# Patient Record
Sex: Female | Born: 1935
Health system: Southern US, Community
[De-identification: ages and names within clinical notes are randomized; demographics above are authoritative.]

## PROBLEM LIST (undated history)

## (undated) DIAGNOSIS — M5416 Radiculopathy, lumbar region: Secondary | ICD-10-CM

## (undated) DIAGNOSIS — M8430XA Stress fracture, unspecified site, initial encounter for fracture: Secondary | ICD-10-CM

## (undated) DIAGNOSIS — D329 Benign neoplasm of meninges, unspecified: Secondary | ICD-10-CM

## (undated) DIAGNOSIS — R55 Syncope and collapse: Secondary | ICD-10-CM

## (undated) DIAGNOSIS — I4891 Unspecified atrial fibrillation: Secondary | ICD-10-CM

## (undated) DIAGNOSIS — M7551 Bursitis of right shoulder: Secondary | ICD-10-CM

## (undated) DIAGNOSIS — Z809 Family history of malignant neoplasm, unspecified: Secondary | ICD-10-CM

## (undated) DIAGNOSIS — I10 Essential (primary) hypertension: Secondary | ICD-10-CM

## (undated) DIAGNOSIS — K219 Gastro-esophageal reflux disease without esophagitis: Secondary | ICD-10-CM

## (undated) DIAGNOSIS — C4490 Unspecified malignant neoplasm of skin, unspecified: Secondary | ICD-10-CM

## (undated) DIAGNOSIS — T7840XA Allergy, unspecified, initial encounter: Secondary | ICD-10-CM

## (undated) DIAGNOSIS — S82892A Other fracture of left lower leg, initial encounter for closed fracture: Secondary | ICD-10-CM

## (undated) DIAGNOSIS — E785 Hyperlipidemia, unspecified: Secondary | ICD-10-CM

## (undated) DIAGNOSIS — C50919 Malignant neoplasm of unspecified site of unspecified female breast: Secondary | ICD-10-CM

## (undated) DIAGNOSIS — H269 Unspecified cataract: Secondary | ICD-10-CM

## (undated) DIAGNOSIS — S62101A Fracture of unspecified carpal bone, right wrist, initial encounter for closed fracture: Secondary | ICD-10-CM

## (undated) HISTORY — DX: Hyperlipidemia, unspecified: E78.5

## (undated) HISTORY — PX: EYE SURGERY: SHX253

## (undated) HISTORY — DX: Essential (primary) hypertension: I10

## (undated) HISTORY — DX: Unspecified malignant neoplasm of skin, unspecified: C44.90

## (undated) HISTORY — DX: Stress fracture, unspecified site, initial encounter for fracture: M84.30XA

## (undated) HISTORY — DX: Radiculopathy, lumbar region: M54.16

## (undated) HISTORY — PX: APPENDECTOMY: SHX54

## (undated) HISTORY — DX: Bursitis of right shoulder: M75.51

## (undated) HISTORY — DX: Benign neoplasm of meninges, unspecified: D32.9

## (undated) HISTORY — DX: Unspecified cataract: H26.9

## (undated) HISTORY — PX: OTHER SURGICAL HISTORY: SHX169

## (undated) HISTORY — DX: Family history of malignant neoplasm, unspecified: Z80.9

## (undated) HISTORY — DX: Unspecified atrial fibrillation: I48.91

## (undated) HISTORY — PX: DG  BONE DENSITY (ARMC HX): HXRAD1102

## (undated) HISTORY — PX: BREAST LUMPECTOMY: SHX2

## (undated) HISTORY — PX: GANGLION CYST EXCISION: SHX1691

---

## 1986-06-27 HISTORY — PX: ABDOMINAL HYSTERECTOMY: SHX81

## 1998-06-27 DIAGNOSIS — C4490 Unspecified malignant neoplasm of skin, unspecified: Secondary | ICD-10-CM

## 1998-06-27 HISTORY — DX: Unspecified malignant neoplasm of skin, unspecified: C44.90

## 2004-07-19 ENCOUNTER — Emergency Department (HOSPITAL_COMMUNITY): Admission: EM | Admit: 2004-07-19 | Discharge: 2004-07-19 | Payer: Self-pay | Admitting: *Deleted

## 2004-07-23 ENCOUNTER — Ambulatory Visit: Payer: Self-pay | Admitting: Family Medicine

## 2004-10-21 ENCOUNTER — Ambulatory Visit: Payer: Self-pay | Admitting: Family Medicine

## 2004-12-14 ENCOUNTER — Ambulatory Visit: Payer: Self-pay | Admitting: Internal Medicine

## 2005-01-07 ENCOUNTER — Ambulatory Visit: Payer: Self-pay | Admitting: Family Medicine

## 2005-02-10 ENCOUNTER — Ambulatory Visit: Payer: Self-pay | Admitting: Family Medicine

## 2005-02-22 ENCOUNTER — Ambulatory Visit: Payer: Self-pay | Admitting: Family Medicine

## 2005-04-25 ENCOUNTER — Ambulatory Visit: Payer: Self-pay | Admitting: Family Medicine

## 2005-08-04 ENCOUNTER — Ambulatory Visit: Payer: Self-pay | Admitting: Family Medicine

## 2005-11-18 ENCOUNTER — Ambulatory Visit: Payer: Self-pay | Admitting: Internal Medicine

## 2006-01-05 ENCOUNTER — Ambulatory Visit: Payer: Self-pay | Admitting: Family Medicine

## 2006-02-24 ENCOUNTER — Encounter: Admission: RE | Admit: 2006-02-24 | Discharge: 2006-02-24 | Payer: Self-pay | Admitting: Family Medicine

## 2006-03-15 ENCOUNTER — Ambulatory Visit: Payer: Self-pay | Admitting: Family Medicine

## 2006-05-24 ENCOUNTER — Ambulatory Visit: Payer: Self-pay | Admitting: Family Medicine

## 2006-09-19 ENCOUNTER — Ambulatory Visit: Payer: Self-pay | Admitting: Family Medicine

## 2006-10-17 DIAGNOSIS — N6009 Solitary cyst of unspecified breast: Secondary | ICD-10-CM | POA: Insufficient documentation

## 2006-10-17 DIAGNOSIS — Z8719 Personal history of other diseases of the digestive system: Secondary | ICD-10-CM | POA: Insufficient documentation

## 2006-10-17 DIAGNOSIS — C439 Malignant melanoma of skin, unspecified: Secondary | ICD-10-CM | POA: Insufficient documentation

## 2007-04-10 ENCOUNTER — Ambulatory Visit: Payer: Self-pay | Admitting: Family Medicine

## 2007-04-11 ENCOUNTER — Encounter: Admission: RE | Admit: 2007-04-11 | Discharge: 2007-04-11 | Payer: Self-pay | Admitting: Family Medicine

## 2007-04-16 ENCOUNTER — Encounter (INDEPENDENT_AMBULATORY_CARE_PROVIDER_SITE_OTHER): Payer: Self-pay | Admitting: *Deleted

## 2007-07-12 DIAGNOSIS — M949 Disorder of cartilage, unspecified: Secondary | ICD-10-CM

## 2007-07-12 DIAGNOSIS — M899 Disorder of bone, unspecified: Secondary | ICD-10-CM | POA: Insufficient documentation

## 2007-07-27 ENCOUNTER — Encounter: Payer: Self-pay | Admitting: Family Medicine

## 2007-07-27 ENCOUNTER — Ambulatory Visit: Payer: Self-pay | Admitting: Internal Medicine

## 2007-08-03 ENCOUNTER — Encounter (INDEPENDENT_AMBULATORY_CARE_PROVIDER_SITE_OTHER): Payer: Self-pay | Admitting: *Deleted

## 2007-09-24 ENCOUNTER — Telehealth (INDEPENDENT_AMBULATORY_CARE_PROVIDER_SITE_OTHER): Payer: Self-pay | Admitting: *Deleted

## 2007-10-18 ENCOUNTER — Encounter (INDEPENDENT_AMBULATORY_CARE_PROVIDER_SITE_OTHER): Payer: Self-pay | Admitting: *Deleted

## 2007-10-18 ENCOUNTER — Ambulatory Visit: Payer: Self-pay | Admitting: Family Medicine

## 2007-10-18 DIAGNOSIS — I10 Essential (primary) hypertension: Secondary | ICD-10-CM | POA: Insufficient documentation

## 2007-10-18 LAB — CONVERTED CEMR LAB
BUN: 12 mg/dL (ref 6–23)
CO2: 34 meq/L — ABNORMAL HIGH (ref 19–32)
Calcium: 10.2 mg/dL (ref 8.4–10.5)
Chloride: 100 meq/L (ref 96–112)
Creatinine, Ser: 0.7 mg/dL (ref 0.4–1.2)
GFR calc Af Amer: 105 mL/min
GFR calc non Af Amer: 87 mL/min
Glucose, Bld: 67 mg/dL — ABNORMAL LOW (ref 70–99)
Potassium: 4.6 meq/L (ref 3.5–5.1)
Sodium: 141 meq/L (ref 135–145)

## 2008-01-29 ENCOUNTER — Ambulatory Visit: Payer: Self-pay | Admitting: Family Medicine

## 2008-01-29 DIAGNOSIS — L723 Sebaceous cyst: Secondary | ICD-10-CM | POA: Insufficient documentation

## 2008-01-30 ENCOUNTER — Ambulatory Visit: Payer: Self-pay | Admitting: Family Medicine

## 2008-02-07 ENCOUNTER — Encounter: Payer: Self-pay | Admitting: Family Medicine

## 2008-02-07 ENCOUNTER — Ambulatory Visit: Payer: Self-pay | Admitting: Family Medicine

## 2008-02-09 LAB — CONVERTED CEMR LAB
ALT: 18 units/L (ref 0–35)
AST: 16 units/L (ref 0–37)
Albumin: 3.8 g/dL (ref 3.5–5.2)
Alkaline Phosphatase: 56 units/L (ref 39–117)
BUN: 14 mg/dL (ref 6–23)
Bilirubin, Direct: 0.1 mg/dL (ref 0.0–0.3)
CO2: 31 meq/L (ref 19–32)
Calcium: 9.5 mg/dL (ref 8.4–10.5)
Chloride: 103 meq/L (ref 96–112)
Cholesterol: 205 mg/dL (ref 0–200)
Creatinine, Ser: 1 mg/dL (ref 0.4–1.2)
Direct LDL: 131.1 mg/dL
GFR calc Af Amer: 70 mL/min
GFR calc non Af Amer: 57 mL/min
Glucose, Bld: 98 mg/dL (ref 70–99)
HDL: 45.7 mg/dL (ref >39.0)
Potassium: 4.2 meq/L (ref 3.5–5.1)
Sodium: 142 meq/L (ref 135–145)
Total Bilirubin: 0.8 mg/dL (ref 0.3–1.2)
Total CHOL/HDL Ratio: 4.5
Total Protein: 6.9 g/dL (ref 6.0–8.3)
Triglycerides: 106 mg/dL (ref 0–149)
VLDL: 21 mg/dL (ref 0–40)

## 2008-02-11 ENCOUNTER — Encounter (INDEPENDENT_AMBULATORY_CARE_PROVIDER_SITE_OTHER): Payer: Self-pay | Admitting: *Deleted

## 2008-02-12 ENCOUNTER — Ambulatory Visit: Payer: Self-pay | Admitting: Family Medicine

## 2008-02-15 ENCOUNTER — Ambulatory Visit: Payer: Self-pay | Admitting: Family Medicine

## 2008-04-30 ENCOUNTER — Encounter: Payer: Self-pay | Admitting: Family Medicine

## 2008-05-02 ENCOUNTER — Ambulatory Visit: Payer: Self-pay | Admitting: Family Medicine

## 2008-05-06 ENCOUNTER — Encounter: Admission: RE | Admit: 2008-05-06 | Discharge: 2008-05-06 | Payer: Self-pay | Admitting: Family Medicine

## 2008-05-08 ENCOUNTER — Encounter (INDEPENDENT_AMBULATORY_CARE_PROVIDER_SITE_OTHER): Payer: Self-pay | Admitting: *Deleted

## 2008-05-10 ENCOUNTER — Encounter: Payer: Self-pay | Admitting: Family Medicine

## 2008-05-10 ENCOUNTER — Telehealth: Payer: Self-pay | Admitting: Family Medicine

## 2008-05-10 ENCOUNTER — Ambulatory Visit: Payer: Self-pay | Admitting: Family Medicine

## 2008-05-10 DIAGNOSIS — N390 Urinary tract infection, site not specified: Secondary | ICD-10-CM | POA: Insufficient documentation

## 2008-05-10 LAB — CONVERTED CEMR LAB
Bilirubin Urine: NEGATIVE
Glucose, Urine, Semiquant: NEGATIVE
Ketones, urine, test strip: NEGATIVE
Nitrite: NEGATIVE
Protein, U semiquant: NEGATIVE
Specific Gravity, Urine: <1.005
Urobilinogen, UA: 0.2
pH: 6

## 2008-05-13 ENCOUNTER — Telehealth (INDEPENDENT_AMBULATORY_CARE_PROVIDER_SITE_OTHER): Payer: Self-pay | Admitting: *Deleted

## 2008-05-14 ENCOUNTER — Encounter: Payer: Self-pay | Admitting: Family Medicine

## 2008-05-19 ENCOUNTER — Ambulatory Visit: Payer: Self-pay | Admitting: Family Medicine

## 2008-06-27 HISTORY — PX: COLONOSCOPY: SHX174

## 2008-07-01 ENCOUNTER — Ambulatory Visit: Payer: Self-pay | Admitting: Family Medicine

## 2008-08-18 ENCOUNTER — Ambulatory Visit: Payer: Self-pay | Admitting: Family Medicine

## 2008-09-09 ENCOUNTER — Ambulatory Visit: Payer: Self-pay | Admitting: Family Medicine

## 2008-09-11 LAB — CONVERTED CEMR LAB: Varicella IgG: 4.25 — ABNORMAL HIGH

## 2008-09-12 ENCOUNTER — Encounter (INDEPENDENT_AMBULATORY_CARE_PROVIDER_SITE_OTHER): Payer: Self-pay | Admitting: *Deleted

## 2008-09-28 LAB — CONVERTED CEMR LAB
ALT: 30 units/L (ref 0–35)
AST: 26 units/L (ref 0–37)
Albumin: 4 g/dL (ref 3.5–5.2)
Alkaline Phosphatase: 56 units/L (ref 39–117)
BUN: 19 mg/dL (ref 6–23)
Bilirubin, Direct: 0 mg/dL (ref 0.0–0.3)
CO2: 33 meq/L — ABNORMAL HIGH (ref 19–32)
Calcium: 9.9 mg/dL (ref 8.4–10.5)
Chloride: 105 meq/L (ref 96–112)
Cholesterol: 227 mg/dL — ABNORMAL HIGH (ref 0–200)
Creatinine, Ser: 0.7 mg/dL (ref 0.4–1.2)
Direct LDL: 139.4 mg/dL
GFR calc non Af Amer: 86.53 mL/min (ref >60)
Glucose, Bld: 93 mg/dL (ref 70–99)
HDL: 62.2 mg/dL (ref >39.00)
Potassium: 5.1 meq/L (ref 3.5–5.1)
Sodium: 144 meq/L (ref 135–145)
Total Bilirubin: 0.9 mg/dL (ref 0.3–1.2)
Total CHOL/HDL Ratio: 4
Total Protein: 6.9 g/dL (ref 6.0–8.3)
Triglycerides: 93 mg/dL (ref 0.0–149.0)
VLDL: 18.6 mg/dL (ref 0.0–40.0)

## 2008-09-29 ENCOUNTER — Encounter (INDEPENDENT_AMBULATORY_CARE_PROVIDER_SITE_OTHER): Payer: Self-pay | Admitting: *Deleted

## 2009-01-26 ENCOUNTER — Ambulatory Visit: Payer: Self-pay | Admitting: Family Medicine

## 2009-02-09 ENCOUNTER — Telehealth (INDEPENDENT_AMBULATORY_CARE_PROVIDER_SITE_OTHER): Payer: Self-pay | Admitting: *Deleted

## 2009-02-18 ENCOUNTER — Ambulatory Visit: Payer: Self-pay | Admitting: Family Medicine

## 2009-02-18 DIAGNOSIS — S93409A Sprain of unspecified ligament of unspecified ankle, initial encounter: Secondary | ICD-10-CM | POA: Insufficient documentation

## 2009-04-06 ENCOUNTER — Ambulatory Visit: Payer: Self-pay | Admitting: Family Medicine

## 2009-04-06 DIAGNOSIS — M25559 Pain in unspecified hip: Secondary | ICD-10-CM | POA: Insufficient documentation

## 2009-04-15 ENCOUNTER — Telehealth (INDEPENDENT_AMBULATORY_CARE_PROVIDER_SITE_OTHER): Payer: Self-pay | Admitting: *Deleted

## 2009-04-20 ENCOUNTER — Encounter: Payer: Self-pay | Admitting: Family Medicine

## 2009-04-27 ENCOUNTER — Ambulatory Visit: Payer: Self-pay | Admitting: Family Medicine

## 2009-05-15 ENCOUNTER — Encounter: Admission: RE | Admit: 2009-05-15 | Discharge: 2009-05-15 | Payer: Self-pay | Admitting: Family Medicine

## 2009-05-15 LAB — HM MAMMOGRAPHY: HM Mammogram: NEGATIVE

## 2009-06-05 ENCOUNTER — Telehealth (INDEPENDENT_AMBULATORY_CARE_PROVIDER_SITE_OTHER): Payer: Self-pay | Admitting: *Deleted

## 2009-08-14 ENCOUNTER — Telehealth: Payer: Self-pay | Admitting: Family Medicine

## 2009-08-24 ENCOUNTER — Ambulatory Visit: Payer: Self-pay | Admitting: Family Medicine

## 2009-08-24 DIAGNOSIS — L821 Other seborrheic keratosis: Secondary | ICD-10-CM | POA: Insufficient documentation

## 2009-08-26 ENCOUNTER — Telehealth (INDEPENDENT_AMBULATORY_CARE_PROVIDER_SITE_OTHER): Payer: Self-pay | Admitting: *Deleted

## 2009-09-01 ENCOUNTER — Ambulatory Visit: Payer: Self-pay | Admitting: Family Medicine

## 2009-09-09 LAB — CONVERTED CEMR LAB
ALT: 21 units/L (ref 0–35)
AST: 19 units/L (ref 0–37)
Albumin: 3.8 g/dL (ref 3.5–5.2)
Alkaline Phosphatase: 62 units/L (ref 39–117)
BUN: 14 mg/dL (ref 6–23)
Bilirubin, Direct: 0.1 mg/dL (ref 0.0–0.3)
CO2: 33 meq/L — ABNORMAL HIGH (ref 19–32)
Calcium: 9.7 mg/dL (ref 8.4–10.5)
Chloride: 111 meq/L (ref 96–112)
Cholesterol: 204 mg/dL — ABNORMAL HIGH (ref 0–200)
Creatinine, Ser: 0.8 mg/dL (ref 0.4–1.2)
Direct LDL: 127.1 mg/dL
GFR calc non Af Amer: 73.98 mL/min (ref >60)
Glucose, Bld: 96 mg/dL (ref 70–99)
HDL: 63.8 mg/dL (ref >39.00)
Potassium: 5.2 meq/L — ABNORMAL HIGH (ref 3.5–5.1)
Sodium: 146 meq/L — ABNORMAL HIGH (ref 135–145)
Total Bilirubin: 0.7 mg/dL (ref 0.3–1.2)
Total CHOL/HDL Ratio: 3
Total Protein: 6.9 g/dL (ref 6.0–8.3)
Triglycerides: 111 mg/dL (ref 0.0–149.0)
VLDL: 22.2 mg/dL (ref 0.0–40.0)

## 2009-10-06 ENCOUNTER — Ambulatory Visit: Payer: Self-pay | Admitting: Family Medicine

## 2009-10-06 ENCOUNTER — Encounter: Payer: Self-pay | Admitting: Family Medicine

## 2009-11-16 ENCOUNTER — Encounter (INDEPENDENT_AMBULATORY_CARE_PROVIDER_SITE_OTHER): Payer: Self-pay | Admitting: *Deleted

## 2009-11-16 ENCOUNTER — Ambulatory Visit: Payer: Self-pay | Admitting: Family Medicine

## 2009-11-20 ENCOUNTER — Encounter: Payer: Self-pay | Admitting: Family Medicine

## 2009-11-20 ENCOUNTER — Telehealth (INDEPENDENT_AMBULATORY_CARE_PROVIDER_SITE_OTHER): Payer: Self-pay | Admitting: *Deleted

## 2009-12-25 ENCOUNTER — Encounter (INDEPENDENT_AMBULATORY_CARE_PROVIDER_SITE_OTHER): Payer: Self-pay | Admitting: *Deleted

## 2009-12-30 ENCOUNTER — Ambulatory Visit: Payer: Self-pay | Admitting: Internal Medicine

## 2010-01-12 ENCOUNTER — Ambulatory Visit: Payer: Self-pay | Admitting: Internal Medicine

## 2010-01-12 LAB — HM COLONOSCOPY: HM Colonoscopy: NORMAL

## 2010-02-05 ENCOUNTER — Telehealth (INDEPENDENT_AMBULATORY_CARE_PROVIDER_SITE_OTHER): Payer: Self-pay | Admitting: *Deleted

## 2010-02-16 ENCOUNTER — Ambulatory Visit: Payer: Self-pay | Admitting: Family Medicine

## 2010-02-16 DIAGNOSIS — E785 Hyperlipidemia, unspecified: Secondary | ICD-10-CM | POA: Insufficient documentation

## 2010-02-16 DIAGNOSIS — M26629 Arthralgia of temporomandibular joint, unspecified side: Secondary | ICD-10-CM | POA: Insufficient documentation

## 2010-02-18 LAB — CONVERTED CEMR LAB
ALT: 19 units/L (ref 0–35)
AST: 19 units/L (ref 0–37)
Albumin: 4.4 g/dL (ref 3.5–5.2)
Alkaline Phosphatase: 54 units/L (ref 39–117)
BUN: 15 mg/dL (ref 6–23)
Bilirubin, Direct: 0.2 mg/dL (ref 0.0–0.3)
CO2: 30 meq/L (ref 19–32)
Calcium: 9.9 mg/dL (ref 8.4–10.5)
Chloride: 106 meq/L (ref 96–112)
Cholesterol: 153 mg/dL (ref 0–200)
Creatinine, Ser: 0.8 mg/dL (ref 0.4–1.2)
GFR calc non Af Amer: 78.39 mL/min (ref >60)
Glucose, Bld: 92 mg/dL (ref 70–99)
HDL: 59.1 mg/dL (ref >39.00)
LDL Cholesterol: 76 mg/dL (ref 0–99)
Potassium: 4.6 meq/L (ref 3.5–5.1)
Sodium: 146 meq/L — ABNORMAL HIGH (ref 135–145)
Total Bilirubin: 0.9 mg/dL (ref 0.3–1.2)
Total CHOL/HDL Ratio: 3
Total Protein: 6.8 g/dL (ref 6.0–8.3)
Triglycerides: 88 mg/dL (ref 0.0–149.0)
VLDL: 17.6 mg/dL (ref 0.0–40.0)

## 2010-02-19 ENCOUNTER — Telehealth (INDEPENDENT_AMBULATORY_CARE_PROVIDER_SITE_OTHER): Payer: Self-pay | Admitting: *Deleted

## 2010-02-24 ENCOUNTER — Encounter: Payer: Self-pay | Admitting: Family Medicine

## 2010-03-18 ENCOUNTER — Telehealth (INDEPENDENT_AMBULATORY_CARE_PROVIDER_SITE_OTHER): Payer: Self-pay | Admitting: *Deleted

## 2010-03-26 ENCOUNTER — Encounter: Payer: Self-pay | Admitting: Family Medicine

## 2010-05-24 ENCOUNTER — Encounter: Payer: Self-pay | Admitting: Family Medicine

## 2010-05-26 ENCOUNTER — Encounter (INDEPENDENT_AMBULATORY_CARE_PROVIDER_SITE_OTHER): Payer: Self-pay | Admitting: *Deleted

## 2010-05-31 ENCOUNTER — Ambulatory Visit: Payer: Self-pay | Admitting: Family Medicine

## 2010-06-02 ENCOUNTER — Telehealth (INDEPENDENT_AMBULATORY_CARE_PROVIDER_SITE_OTHER): Payer: Self-pay | Admitting: *Deleted

## 2010-06-02 LAB — CONVERTED CEMR LAB
ALT: 20 units/L (ref 0–35)
AST: 23 units/L (ref 0–37)
Albumin: 4.2 g/dL (ref 3.5–5.2)
Alkaline Phosphatase: 60 units/L (ref 39–117)
Bilirubin, Direct: 0.1 mg/dL (ref 0.0–0.3)
Cholesterol: 170 mg/dL (ref 0–200)
HDL: 61.8 mg/dL (ref >39.00)
LDL Cholesterol: 89 mg/dL (ref 0–99)
Total Bilirubin: 0.7 mg/dL (ref 0.3–1.2)
Total CHOL/HDL Ratio: 3
Total Protein: 6.7 g/dL (ref 6.0–8.3)
Triglycerides: 96 mg/dL (ref 0.0–149.0)
VLDL: 19.2 mg/dL (ref 0.0–40.0)

## 2010-07-17 ENCOUNTER — Encounter: Payer: Self-pay | Admitting: Family Medicine

## 2010-07-25 LAB — CONVERTED CEMR LAB
ALT: 16 units/L (ref 0–35)
AST: 23 units/L (ref 0–37)
Albumin: 4.2 g/dL (ref 3.5–5.2)
Alkaline Phosphatase: 53 units/L (ref 39–117)
BUN: 16 mg/dL (ref 6–23)
Basophils Absolute: 0 10*3/uL (ref 0.0–0.1)
Basophils Relative: 0.5 % (ref 0.0–3.0)
Bilirubin Urine: NEGATIVE
Bilirubin, Direct: 0.1 mg/dL (ref 0.0–0.3)
Blood in Urine, dipstick: NEGATIVE
CO2: 33 meq/L — ABNORMAL HIGH (ref 19–32)
Calcium: 9.6 mg/dL (ref 8.4–10.5)
Chloride: 103 meq/L (ref 96–112)
Cholesterol: 226 mg/dL — ABNORMAL HIGH (ref 0–200)
Creatinine, Ser: 0.7 mg/dL (ref 0.4–1.2)
Direct LDL: 153.4 mg/dL
Eosinophils Absolute: 0 10*3/uL (ref 0.0–0.7)
Eosinophils Relative: 0.5 % (ref 0.0–5.0)
GFR calc non Af Amer: 80.9 mL/min (ref >60)
Glucose, Bld: 88 mg/dL (ref 70–99)
Glucose, Urine, Semiquant: NEGATIVE
HCT: 44 % (ref 36.0–46.0)
HDL: 59.8 mg/dL (ref >39.00)
Hemoglobin: 15.1 g/dL — ABNORMAL HIGH (ref 12.0–15.0)
Ketones, urine, test strip: NEGATIVE
Lymphocytes Relative: 27.3 % (ref 12.0–46.0)
Lymphs Abs: 2.2 10*3/uL (ref 0.7–4.0)
MCHC: 34.4 g/dL (ref 30.0–36.0)
MCV: 93.4 fL (ref 78.0–100.0)
Monocytes Absolute: 0.5 10*3/uL (ref 0.1–1.0)
Monocytes Relative: 5.8 % (ref 3.0–12.0)
Neutro Abs: 5.3 10*3/uL (ref 1.4–7.7)
Neutrophils Relative %: 65.9 % (ref 43.0–77.0)
Nitrite: NEGATIVE
Platelets: 240 10*3/uL (ref 150.0–400.0)
Potassium: 4.2 meq/L (ref 3.5–5.1)
Protein, U semiquant: NEGATIVE
RBC: 4.71 M/uL (ref 3.87–5.11)
RDW: 13.9 % (ref 11.5–14.6)
Sodium: 143 meq/L (ref 135–145)
Specific Gravity, Urine: <1.005
TSH: 1.4 microintl units/mL (ref 0.35–5.50)
Total Bilirubin: 0.7 mg/dL (ref 0.3–1.2)
Total CHOL/HDL Ratio: 4
Total Protein: 6.6 g/dL (ref 6.0–8.3)
Triglycerides: 82 mg/dL (ref 0.0–149.0)
Urobilinogen, UA: NEGATIVE
VLDL: 16.4 mg/dL (ref 0.0–40.0)
Vit D, 25-Hydroxy: 45 ng/mL (ref 30–89)
WBC Urine, dipstick: NEGATIVE
WBC: 8.1 10*3/uL (ref 4.5–10.5)
pH: 7.5

## 2010-07-27 NOTE — Assessment & Plan Note (Signed)
Summary: cpx//yearly//ph   Vital Signs:  Patient profile:   77 year old female Height:      61 inches Weight:      161.38 pounds BMI:     30.60 Pulse rate:   65 / minute Pulse rhythm:   regular BP sitting:   122 / 84  (left arm) Cuff size:   regular  Vitals Entered By: Army Fossa CMA (Nov 16, 2009 8:28 AM) CC: Pt here for CPX, pt is fasting.   History of Present Illness: Pt here for cpe and labs.  No complaints.    Hypertension follow-up      This is a 77 year old woman who presents for Hypertension follow-up.  The patient denies lightheadedness, urinary frequency, headaches, edema, impotence, rash, and fatigue.  The patient denies the following associated symptoms: chest pain, chest pressure, exercise intolerance, dyspnea, palpitations, syncope, leg edema, and pedal edema.  Compliance with medications (by patient report) has been near 100%.  The patient reports that dietary compliance has been good.  The patient reports exercising 3-4X per week.  Adjunctive measures currently used by the patient include salt restriction.    Preventive Screening-Counseling & Management  Alcohol-Tobacco     Alcohol drinks/day: <1     Smoking Status: never     Passive Smoke Exposure: yes     Passive Smoke Counseling: to avoid passive smoke exposure  Caffeine-Diet-Exercise     Caffeine use/day: 2     Does Patient Exercise: yes     Type of exercise: exercise class     Exercise (avg: min/session): 30-60     Times/week: 3  Hep-HIV-STD-Contraception     Dental Visit-last 6 months yes     Dental Care Counseling: not indicated; dental care within six months     SBE monthly: yes     SBE Education/Counseling: not indicated; SBE done regularly  Safety-Violence-Falls     Seat Belt Use: yes     Seat Belt Counseling: not applicable     Violence in the Home: no risk noted     Sexual Abuse: no     Fall Risk: no     Fall Risk Counseling: not indicated; no significant falls noted      Sexual  History:  widowed.        Drug Use:  no.    Current Medications (verified): 1)  Boniva 150 Mg Tabs (Ibandronate Sodium) .[name].. 1 By Mouth Monthly 2)  Diovan 160 Mg Tabs (Valsartan) .... Take 1 By Mouth Once Daily 3)  Prilosec Otc 20 Mg  Tbec (Omeprazole Magnesium) .... Take 1 Tab Prn 4)  Caltrate 600+d 600-400 Mg-Unit Tabs (Calcium Carbonate-Vitamin D) .... Daily 5)  Biotin .... Daily 6)  Norvasc 5 Mg Tabs (Amlodipine Besylate) .... 1/2  By Mouth Once Daily 7)  Zostavax 53664 Unt/0.34ml Solr (Zoster Vaccine Live) .[name].. 1 Ml Im X1 8)  Senior Multivitamin Plus  Tabs (Multiple Vitamins-Minerals)  Allergies: 1)  ! * Sulpha  Past History:  Family History: Last updated: 11/16/2009 Family History Lung cancer Family History of Thromboembolism clotting disorder-- ? father died of ? clot M--Cancer?--mets to bone---? primary Family History Diabetes 1st degree relative Family History High cholesterol Family History Hypertension Family History Weight disorder Family History of CAD Female 1st degree relative --Brother died of MI at 46yo   Social History: Last updated: 11/16/2009 Retired-- Runner, broadcasting/film/video Widow/Widower Never Smoked Alcohol use-yes Drug use-no Regular exercise-yes  Risk Factors: Alcohol Use: <1 (11/16/2009) Caffeine Use: 2 (11/16/2009) Exercise:  yes (11/16/2009)  Risk Factors: Smoking Status: never (11/16/2009) Passive Smoke Exposure: yes (11/16/2009)  Past Medical History: Hypertension Current Problems:  SEBORRHEIC KERATOSIS (ICD-702.19) HIP PAIN, RIGHT (ICD-719.45) ANKLE SPRAIN (ICD-845.00) HEALTH SCREENING (ICD-V70.0) UTI (ICD-599.0) SEBACEOUS CYST (ICD-706.2) HYPERTENSION (ICD-401.9) OSTEOPENIA (ICD-733.90) COLONOSCOPY, HX OF (ICD-V12.79) BREAST CYST (ICD-610.0) MELANOMA (ICD-172.9)-- R leg  Past Surgical History: Hysterectomy-- TAH/BSO--fibroids Lumpectomy-- b/L---FCS ganglion cyst L hand Appendectomy  Family History: Reviewed history and no changes  required. Family History Lung cancer Family History of Thromboembolism clotting disorder-- ? father died of ? clot M--Cancer?--mets to bone---? primary Family History Diabetes 1st degree relative Family History High cholesterol Family History Hypertension Family History Weight disorder Family History of CAD Female 1st degree relative --Brother died of MI at 47yo   Social History: Reviewed history and no changes required. Retired-- Scientist, forensic Never Smoked Alcohol use-yes Drug use-no Regular exercise-yes Does Patient Exercise:  yes Smoking Status:  never Caffeine use/day:  2 Dental Care w/in 6 mos.:  yes Sexual History:  widowed Risk analyst Use:  yes Fall Risk:  no Drug Use:  no Passive Smoke Exposure:  yes  Review of Systems      See HPI General:  Denies chills, fatigue, fever, loss of appetite, malaise, sleep disorder, sweats, weakness, and weight loss. Eyes:  Denies blurring, discharge, double vision, eye irritation, eye pain, halos, itching, light sensitivity, red eye, vision loss-1 eye, and vision loss-both eyes; optho-- q64m-- Cataract in R eye. ENT:  Denies decreased hearing, difficulty swallowing, ear discharge, earache, hoarseness, nasal congestion, nosebleeds, postnasal drainage, ringing in ears, sinus pressure, and sore throat. CV:  Denies bluish discoloration of lips or nails, chest pain or discomfort, difficulty breathing at night, difficulty breathing while lying down, fainting, fatigue, leg cramps with exertion, lightheadness, near fainting, palpitations, shortness of breath with exertion, swelling of feet, swelling of hands, and weight gain. Resp:  Denies chest discomfort, chest pain with inspiration, cough, coughing up blood, excessive snoring, hypersomnolence, morning headaches, pleuritic, shortness of breath, sputum productive, and wheezing. GI:  Denies abdominal pain, bloody stools, change in bowel habits, constipation, dark tarry stools, diarrhea, excessive  appetite, gas, hemorrhoids, indigestion, and loss of appetite. GU:  Denies abnormal vaginal bleeding, decreased libido, discharge, dysuria, hematuria, incontinence, nocturia, urinary frequency, and urinary hesitancy. MS:  Denies joint pain, joint redness, joint swelling, loss of strength, low back pain, mid back pain, muscle aches, muscle , cramps, muscle weakness, stiffness, and thoracic pain. Derm:  Denies changes in color of skin, changes in nail beds, dryness, excessive perspiration, flushing, hair loss, insect bite(s), itching, lesion(s), poor wound healing, and rash; Derm-- Margo Aye and [facility]. Neuro:  Denies brief paralysis, difficulty with concentration, disturbances in coordination, falling down, headaches, inability to speak, memory loss, numbness, poor balance, seizures, sensation of room spinning, tingling, tremors, visual disturbances, and weakness. Psych:  Denies alternate hallucination ( auditory/visual), anxiety, depression, easily angered, easily tearful, irritability, mental problems, panic attacks, sense of great danger, suicidal thoughts/plans, thoughts of violence, unusual visions or sounds, and thoughts /plans of harming others. Endo:  Denies cold intolerance, excessive hunger, excessive thirst, excessive urination, heat intolerance, polyuria, and weight change. Heme:  Denies abnormal bruising, bleeding, enlarge lymph nodes, fevers, pallor, and skin discoloration. Allergy:  Denies hives or rash, itching eyes, persistent infections, seasonal allergies, and sneezing.  Physical Exam  General:  Well-developed,well-nourished,in no acute distress; alert,appropriate and cooperative throughout examination Head:  Normocephalic and atraumatic without obvious abnormalities. No apparent alopecia or balding. Eyes:  vision grossly intact, pupils equal,  pupils round, pupils reactive to light, and no injection.   Ears:  External ear exam shows no significant lesions or deformities.  Otoscopic  examination reveals clear canals, tympanic membranes are intact bilaterally without bulging, retraction, inflammation or discharge. Hearing is grossly normal bilaterally. Nose:  External nasal examination shows no deformity or inflammation. Nasal mucosa are pink and moist without lesions or exudates. Mouth:  Oral mucosa and oropharynx without lesions or exudates.  Teeth in good repair. Neck:  No deformities, masses, or tenderness noted.no carotid bruits.   Chest Wall:  No deformities, masses, or tenderness noted. Breasts:  No mass, nodules, thickening, tenderness, bulging, retraction, inflamation, nipple discharge or skin changes noted.   Lungs:  Normal respiratory effort, chest expands symmetrically. Lungs are clear to auscultation, no crackles or wheezes. Heart:  normal rate and no murmur.   Abdomen:  Bowel sounds positive,abdomen soft and non-tender without masses, organomegaly or hernias noted. Msk:  normal ROM, no joint tenderness, no joint swelling, no joint warmth, no redness over joints, no joint deformities, no joint instability, and no crepitation.   Pulses:  R posterior tibial normal, R dorsalis pedis normal, R carotid normal, L posterior tibial normal, L dorsalis pedis normal, and L carotid normal.   Extremities:  No clubbing, cyanosis, edema, or deformity noted with normal full range of motion of all joints.   Neurologic:  No cranial nerve deficits noted. Station and gait are normal. Plantar reflexes are down-going bilaterally. DTRs are symmetrical throughout. Sensory, motor and coordinative functions appear intact. Skin:  Intact without suspicious lesions or rashes Cervical Nodes:  No lymphadenopathy noted Axillary Nodes:  No palpable lymphadenopathy Psych:  Cognition and judgment appear intact. Alert and cooperative with normal attention span and concentration. No apparent delusions, illusions, hallucinations   Impression & Recommendations:  Problem # 1:  PREVENTIVE HEALTH CARE  (ICD-V70.0) GHM utd Orders: Gastroenterology Referral (GI) Venipuncture (04540) TLB-BMP (Basic Metabolic Panel-BMET) (80048-METABOL) TLB-CBC Platelet - w/Differential (85025-CBCD) TLB-Hepatic/Liver Function Pnl (80076-HEPATIC) TLB-TSH (Thyroid Stimulating Hormone) (84443-TSH) TLB-Lipid Panel (80061-LIPID) T-Vitamin D (25-Hydroxy) (98119-14782) UA Dipstick w/o Micro (manual) (95621) First annual wellness visit with prevention plan  (H0865) EKG w/ Interpretation (93000)  Problem # 2:  HYPERTENSION (ICD-401.9)  Her updated medication list for this problem includes:    Diovan 160 Mg Tabs (Valsartan) .[name]... Take 1 by mouth once daily    Norvasc 5 Mg Tabs (Amlodipine besylate) .[name]... 1/2  by mouth once daily  Orders: Venipuncture (78469) TLB-BMP (Basic Metabolic Panel-BMET) (80048-METABOL) TLB-CBC Platelet - w/Differential (85025-CBCD) TLB-Hepatic/Liver Function Pnl (80076-HEPATIC) TLB-TSH (Thyroid Stimulating Hormone) (84443-TSH) TLB-Lipid Panel (80061-LIPID) T-Vitamin D (25-Hydroxy) (62952-84132) First annual wellness visit with prevention plan  (G4010) EKG w/ Interpretation (93000)  BP today: 122/84 Prior BP: 126/82 (08/24/2009)  Labs Reviewed: K+: 5.2 (09/01/2009) Creat: : 0.8 (09/01/2009)   Chol: 204 (09/01/2009)   HDL: 63.80 (09/01/2009)   LDL: DEL (01/30/2008)   TG: 111.0 (09/01/2009)  Problem # 3:  OSTEOPENIA (ICD-733.90)  Her updated medication list for this problem includes:    Boniva 150 Mg Tabs (Ibandronate sodium) .[name]... 1 by mouth monthly    Caltrate 600+d 600-400 Mg-unit Tabs (Calcium carbonate-vitamin d) .[name]... Daily  Orders: Venipuncture (27253) TLB-BMP (Basic Metabolic Panel-BMET) (80048-METABOL) TLB-CBC Platelet - w/Differential (85025-CBCD) TLB-Hepatic/Liver Function Pnl (80076-HEPATIC) TLB-TSH (Thyroid Stimulating Hormone) (84443-TSH) TLB-Lipid Panel (80061-LIPID) T-Vitamin D (25-Hydroxy) (66440-34742) First annual wellness visit with prevention plan   (V9563) EKG w/ Interpretation (93000)  Problem # 4:  MELANOMA (ICD-172.9)  Orders: First annual wellness visit with  prevention plan  (Z6109) EKG w/ Interpretation (93000)  Complete Medication List: 1)  Boniva 150 Mg Tabs (Ibandronate sodium) .[name].. 1 by mouth monthly 2)  Diovan 160 Mg Tabs (Valsartan) .... Take 1 by mouth once daily 3)  Prilosec Otc 20 Mg Tbec (Omeprazole magnesium) .... Take 1 tab prn 4)  Caltrate 600+d 600-400 Mg-unit Tabs (Calcium carbonate-vitamin d) .... Daily 5)  Biotin  .... Daily 6)  Norvasc 5 Mg Tabs (Amlodipine besylate) .... 1/2  by mouth once daily 7)  Zostavax 60454 Unt/0.25ml Solr (Zoster vaccine live) .[name].. 1 ml im x1 8)  Senior Multivitamin Plus Tabs (Multiple vitamins-minerals) Prescriptions: DIOVAN 160 MG TABS (VALSARTAN) Take 1 by mouth once daily  #90 x 3   Entered and Authorized by:   Loreen Freud DO   Signed by:   Loreen Freud DO on 11/16/2009   Method used:   Faxed to ...       [facility] Environmental education officer)       [address] [address]       [place], [place]  [address]       Ph: [phone]       Fax: [phone]   RxID:   [id number] ZOSTAVAX 19400 UNT/0.65ML SOLR (ZOSTER VACCINE LIVE) 1 ml IM x1  #1 x 0   Entered and Authorized by:   Loreen Freud DO   Signed by:   Loreen Freud DO on 11/16/2009   Method used:   Print then Give to Patient   RxID:   [id number]    EKG  Procedure date:  11/16/2009  Findings:      Sinus bradycardia with rate of: 52 bpm ,  Left axis deviation.     PAP Next Due:  Not Indicated Last Mammogram:  ASSESSMENT: Negative - BI-RADS 1^MM DIGITAL SCREENING (05/15/2009 8:33:00 AM) Mammogram Result Date:  05/13/2009 Mammogram Result:  normal Mammogram Next Due:  1 yr Bone Density Result Date:  10/06/2009 Bone Density Result:  osteopenic Bone Density Next Due: 2 yr        Past Medical History:    Hypertension    Current Problems:     SEBORRHEIC KERATOSIS (ICD-702.19)    HIP PAIN, RIGHT (ICD-719.45)     ANKLE SPRAIN (ICD-845.00)    HEALTH SCREENING (ICD-V70.0)    UTI (ICD-599.0)    SEBACEOUS CYST (ICD-706.2)    HYPERTENSION (ICD-401.9)    OSTEOPENIA (ICD-733.90)    COLONOSCOPY, HX OF (ICD-V12.79)    BREAST CYST (ICD-610.0)    MELANOMA (ICD-172.9)-- R leg        Immunization History:  Tetanus/Td Immunization History:    Tetanus/Td:  Historical (11/05/2003)  Laboratory Results   Urine Tests   Date/Time Reported: Nov 16, 2009 11:08 AM   Routine Urinalysis   Color: yellow Appearance: Clear Glucose: negative   (Normal Range: Negative) Bilirubin: negative   (Normal Range: Negative) Ketone: negative   (Normal Range: Negative) Spec. Gravity: <1.005   (Normal Range: 1.003-1.035) Blood: negative   (Normal Range: Negative) pH: 7.5   (Normal Range: 5.0-8.0) Protein: negative   (Normal Range: Negative) Urobilinogen: negative   (Normal Range: 0-1) Nitrite: negative   (Normal Range: Negative) Leukocyte Esterace: negative   (Normal Range: Negative)    Comments: Floydene Flock  Nov 16, 2009 11:08 AM

## 2010-07-27 NOTE — Letter (Signed)
Summary: Results Follow-up Letter  [facility]  [address] [place], [place] [address]   Phone: [phone]  Fax: [phone]    09/29/2008        [facility] [address] [place], [place]  [address]  Dear Ms. Stowers,   The following are the results of your recent test(s):  Test     Result     Pap Smear    Normal_______  Not Normal_____       Comments: _________________________________________________________ Cholesterol LDL(Bad cholesterol):          Your goal is less than:         HDL (Good cholesterol):        Your goal is more than: _________________________________________________________ Other Tests:   _________________________________________________________  Please call for an appointment Or __Please see attached labwork._______________________________________________________ _________________________________________________________ _________________________________________________________  Sincerely,  Ardyth Man [place] at [facility]

## 2010-07-27 NOTE — Assessment & Plan Note (Signed)
Summary: BLD PRESSURE FOLLOW UP/RH......   Vital Signs:  Patient profile:   77 year old female Weight:      161 pounds Temp:     98.8 degrees F oral Pulse rate:   62 / minute Pulse rhythm:   regular BP sitting:   126 / 82  (left arm) Cuff size:   regular  Vitals Entered By: Army Fossa CMA (August 24, 2009 12:57 PM)  History of Present Illness:  Hypertension follow-up      This is a 77 year old woman who presents for Hypertension follow-up.  The patient denies lightheadedness, urinary frequency, headaches, edema, impotence, rash, and fatigue.  The patient denies the following associated symptoms: chest pain, chest pressure, exercise intolerance, dyspnea, palpitations, syncope, leg edema, and pedal edema.  Compliance with medications (by patient report) has been near 100%.  The patient reports that dietary compliance has been good.  The patient reports exercising 3-4X per week.  Adjunctive measures currently used by the patient include salt restriction.    Current Medications (verified): 1)  Boniva 150 Mg Tabs (Ibandronate Sodium) .[name].. 1 By Mouth Monthly 2)  Diovan 160 Mg Tabs (Valsartan) .... Take 1 By Mouth Once Daily 3)  Prilosec Otc 20 Mg  Tbec (Omeprazole Magnesium) .... Take 1 Tab Prn 4)  Caltrate 600+d 600-400 Mg-Unit Tabs (Calcium Carbonate-Vitamin D) .... Daily 5)  Biotin .... Daily 6)  Norvasc 5 Mg Tabs (Amlodipine Besylate) .... 1/2  By Mouth Once Daily  Allergies: 1)  ! * Sulpha  Past History:  Past medical, surgical, family and social histories (including risk factors) reviewed for relevance to current acute and chronic problems.  Past Medical History: Reviewed history from 10/18/2007 and no changes required. Hypertension  Past Surgical History: Reviewed history from 10/17/2006 and no changes required. Hysterectomy  Family History: Reviewed history and no changes required.  Social History: Reviewed history and no changes required.  Review of  Systems      See HPI  Physical Exam  General:  Well-developed,well-nourished,in no acute distress; alert,appropriate and cooperative throughout examination Lungs:  Normal respiratory effort, chest expands symmetrically. Lungs are clear to auscultation, no crackles or wheezes. Heart:  Normal rate and regular rhythm. S1 and S2 normal without gallop, murmur, click, rub or other extra sounds. Skin:  SK R side neck Psych:  Cognition and judgment appear intact. Alert and cooperative with normal attention span and concentration. No apparent delusions, illusions, hallucinations   Impression & Recommendations:  Problem # 1:  HYPERTENSION (ICD-401.9)  Her updated medication list for this problem includes:    Diovan 160 Mg Tabs (Valsartan) .[name]... Take 1 by mouth once daily    Norvasc 5 Mg Tabs (Amlodipine besylate) .[name]... 1/2  by mouth once daily  BP today: 126/82 Prior BP: 124/88 (04/06/2009)  Labs Reviewed: K+: 5.1 (09/09/2008) Creat: : 0.7 (09/09/2008)   Chol: 227 (09/09/2008)   HDL: 62.20 (09/09/2008)   LDL: DEL (01/30/2008)   TG: 93.0 (09/09/2008)  Problem # 2:  SEBORRHEIC KERATOSIS (ICD-702.19) hx melanoma---pt will f/u Dr Margo Aye  Complete Medication List: 1)  Boniva 150 Mg Tabs (Ibandronate sodium) .[name].. 1 by mouth monthly 2)  Diovan 160 Mg Tabs (Valsartan) .... Take 1 by mouth once daily 3)  Prilosec Otc 20 Mg Tbec (Omeprazole magnesium) .... Take 1 tab prn 4)  Caltrate 600+d 600-400 Mg-unit Tabs (Calcium carbonate-vitamin d) .... Daily 5)  Biotin  .... Daily 6)  Norvasc 5 Mg Tabs (Amlodipine besylate) .... 1/2  by mouth once  daily  Patient Instructions: 1)  fasting labs 401.9  bmp, hep, lipid 2)  Cpe in 3 months Prescriptions: BONIVA 150 MG TABS (IBANDRONATE SODIUM) 1 by mouth monthly  #12 x 3   Entered and Authorized by:   Loreen Freud DO   Signed by:   Loreen Freud DO on 08/24/2009   Method used:   Faxed to ...       [facility] Environmental education officer)       [address] [address]        [place], [place]  [address]       Ph: [phone]       Fax: [phone]   RxID:   [id number]
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
## 2010-07-27 NOTE — Miscellaneous (Signed)
Summary: BONE DENSITY  Clinical Lists Changes  Orders: Added new Test order of T-Bone Densitometry (77080) - Signed Added new Test order of T-Lumbar Vertebral Assessment (77082) - Signed 

## 2010-07-27 NOTE — Progress Notes (Signed)
  Phone Note Outgoing Call   Call placed by: Ardyth Man,  September 24, 2007 10:31 AM Call placed to: Patient Summary of Call: Left message for patient to call the office in regards to needs ov for med refill ...................................................................Ardyth Man  September 24, 2007 10:31 AM

## 2010-07-27 NOTE — Progress Notes (Signed)
Summary: refill  Phone Note Refill Request Message from:  Fax from Pharmacy on June 02, 2010 4:37 PM  Refills Requested: Medication #1:  PRAVACHOL 40 MG TABS 1 by mouth at bedtime. **APPOINTMENT DUE 04/2010** [facility] [address] - fax [phone]  Initial call taken by: Okey Regal Spring,  June 02, 2010 4:38 PM    New/Updated Medications: PRAVACHOL 40 MG TABS (PRAVASTATIN SODIUM) 1 by mouth at bedtime. Prescriptions: PRAVACHOL 40 MG TABS (PRAVASTATIN SODIUM) 1 by mouth at bedtime.  #30 x 2   Entered by:   Almeta Monas CMA (AAMA)   Authorized by:   Loreen Freud DO   Signed by:   Almeta Monas CMA (AAMA) on 06/02/2010   Method used:   Faxed to ...       [facility]* (retail)       [address]       [place], [place]  [address]       Ph: [phone]       Fax: [phone]   RxID:   [id number]

## 2010-07-27 NOTE — Progress Notes (Signed)
Summary: REFILL REQUEST  Phone Note Refill Request Message from:  Pharmacy on February 19, 2010 1:43 PM  Refills Requested: Medication #1:  ZOSTAVAX [address] UNT/0.65ML SOLR 1 ml IM x1   Dosage confirmed as above?Dosage Confirmed   Supply Requested: 1 month [facility]   Next Appointment Scheduled: NONE Initial call taken by: Lavell Islam,  February 19, 2010 1:44 PM    Prescriptions: ZOSTAVAX 60454 UNT/0.65ML SOLR (ZOSTER VACCINE LIVE) 1 ml IM x1  #1 x 0   Entered by:   Jeremy Johann CMA   Authorized by:   Loreen Freud DO   Signed by:   Jeremy Johann CMA on 02/19/2010   Method used:   Faxed to ...       [facility]* (retail)       [address]       [place], [place]  [id number]       Ph: [phone]       Fax: [phone]   RxID:   [id number]

## 2010-07-27 NOTE — Assessment & Plan Note (Signed)
Summary: medication concern/kn   Vital Signs:  Patient profile:   77 year old female Weight:      148 pounds BMI:     28.07 Pulse rate:   52 / minute BP sitting:   122 / 80  (left arm)  Vitals Entered By: Almeta Monas CMA Duncan Dull) (February 16, 2010 11:25 AM) CC: concerned about side effects from boniva, pt has experienced one time no other symptoms   History of Present Illness: Pt here to discuss boniva.  She thinks it is causing jaw pain-- she has not taken her next dose.   Pt also needs labs today.  Current Medications (verified): 1)  Diovan 160 Mg Tabs (Valsartan) .... Take 1 By Mouth Once Daily 2)  Prilosec Otc 20 Mg  Tbec (Omeprazole Magnesium) .... Take 1 Tab Prn 3)  Caltrate 600+d 600-400 Mg-Unit Tabs (Calcium Carbonate-Vitamin D) .... Daily 4)  Biotin .... Daily 5)  Norvasc 5 Mg Tabs (Amlodipine Besylate) .... 1/2  By Mouth Once Daily 6)  Zostavax [address] Unt/0.90ml Solr (Zoster Vaccine Live) .[name].. 1 Ml Im X1 7)  Senior Multivitamin Plus  Tabs (Multiple Vitamins-Minerals) 8)  Pravachol 40 Mg Tabs (Pravastatin Sodium) .[name].. 1 By Mouth At Bedtime. 9)  Vitamin D3 1000 Unit Caps (Cholecalciferol) .[name].. 1 By Mouth Once Daily  Allergies (verified): 1)  ! Evette Cristal  Past History:  Past Medical History: Last updated: 11/16/2009 Hypertension Current Problems:  SEBORRHEIC KERATOSIS (ICD-702.19) HIP PAIN, RIGHT (ICD-719.45) ANKLE SPRAIN (ICD-845.00) HEALTH SCREENING (ICD-V70.0) UTI (ICD-599.0) SEBACEOUS CYST (ICD-706.2) HYPERTENSION (ICD-401.9) OSTEOPENIA (ICD-733.90) COLONOSCOPY, HX OF (ICD-V12.79) BREAST CYST (ICD-610.0) MELANOMA (ICD-172.9)-- R leg  Past Surgical History: Last updated: 11/16/2009 Hysterectomy-- TAH/BSO--fibroids Lumpectomy-- b/L---FCS ganglion cyst L hand Appendectomy  Family History: Last updated: 11/16/2009 Family History Lung cancer Family History of Thromboembolism clotting disorder-- ? father died of ? clot M--Cancer?--mets to bone---?  primary Family History Diabetes 1st degree relative Family History High cholesterol Family History Hypertension Family History Weight disorder Family History of CAD Female 1st degree relative --Brother died of MI at 57yo   Social History: Last updated: 11/16/2009 Retired-- Runner, broadcasting/film/video Widow/Widower Never Smoked Alcohol use-yes Drug use-no Regular exercise-yes  Risk Factors: Alcohol Use: <1 (11/16/2009) Caffeine Use: 2 (11/16/2009) Exercise: yes (11/16/2009)  Risk Factors: Smoking Status: never (11/16/2009) Passive Smoke Exposure: yes (11/16/2009)  Family History: Reviewed history from 11/16/2009 and no changes required. Family History Lung cancer Family History of Thromboembolism clotting disorder-- ? father died of ? clot M--Cancer?--mets to bone---? primary Family History Diabetes 1st degree relative Family History High cholesterol Family History Hypertension Family History Weight disorder Family History of CAD Female 1st degree relative --Brother died of MI at 8yo   Social History: Reviewed history from 11/16/2009 and no changes required. Retired-- Scientist, forensic Never Smoked Alcohol use-yes Drug use-no Regular exercise-yes  Review of Systems      See HPI  Physical Exam  General:  Well-developed,well-nourished,in no acute distress; alert,appropriate and cooperative throughout examination Lungs:  Normal respiratory effort, chest expands symmetrically. Lungs are clear to auscultation, no crackles or wheezes. Heart:  Normal rate and regular rhythm. S1 and S2 normal without gallop, murmur, click, rub or other extra sounds. Extremities:  No clubbing, cyanosis, edema, or deformity noted with normal full range of motion of all joints.   Psych:  Cognition and judgment appear intact. Alert and cooperative with normal attention span and concentration. No apparent delusions, illusions, hallucinations   Impression & Recommendations:  Problem # 1:  TMJ PAIN  (ICD-524.62)  f/u dentist  Problem # 2:  OSTEOPENIA (ICD-733.90)  The following medications were removed from the medication list:    Boniva 150 Mg Tabs (Ibandronate sodium) .[name]... 1 by mouth monthly Her updated medication list for this problem includes:    Caltrate 600+d 600-400 Mg-unit Tabs (Calcium carbonate-vitamin d) .[name]... Daily    Vitamin D3 1000 Unit Caps (Cholecalciferol) .[name]... 1 by mouth once daily  Bone Density: osteopenic (10/06/2009) Vit D:45 (11/16/2009)  Problem # 3:  HYPERLIPIDEMIA (ICD-272.4)  Her updated medication list for this problem includes:    Pravachol 40 Mg Tabs (Pravastatin sodium) .[name]... 1 by mouth at bedtime.  Orders: Venipuncture ([address]) TLB-Lipid Panel (80061-LIPID) TLB-BMP (Basic Metabolic Panel-BMET) (80048-METABOL) TLB-Hepatic/Liver Function Pnl (80076-HEPATIC)  Labs Reviewed: SGOT: 23 (11/16/2009)   SGPT: 16 (11/16/2009)   HDL:59.80 (11/16/2009), 63.80 (09/01/2009)  LDL:DEL (01/30/2008)  Chol:226 (11/16/2009), 204 (09/01/2009)  Trig:82.0 (11/16/2009), 111.0 (09/01/2009)  Problem # 4:  HYPERTENSION (ICD-401.9)  Her updated medication list for this problem includes:    Diovan 160 Mg Tabs (Valsartan) .[name]... Take 1 by mouth once daily    Norvasc 5 Mg Tabs (Amlodipine besylate) .[name]... 1/2  by mouth once daily  Orders: Venipuncture ([address]) TLB-Lipid Panel (80061-LIPID) TLB-BMP (Basic Metabolic Panel-BMET) (80048-METABOL) TLB-Hepatic/Liver Function Pnl (80076-HEPATIC)  BP today: 122/80 Prior BP: 122/84 (11/16/2009)  Labs Reviewed: K+: 4.2 (11/16/2009) Creat: : 0.7 (11/16/2009)   Chol: 226 (11/16/2009)   HDL: 59.80 (11/16/2009)   LDL: DEL (01/30/2008)   TG: 82.0 (11/16/2009)  Complete Medication List: 1)  Diovan 160 Mg Tabs (Valsartan) .... Take 1 by mouth once daily 2)  Prilosec Otc 20 Mg Tbec (Omeprazole magnesium) .... Take 1 tab prn 3)  Caltrate 600+d 600-400 Mg-unit Tabs (Calcium carbonate-vitamin d) .... Daily 4)  Biotin  .... Daily 5)   Norvasc 5 Mg Tabs (Amlodipine besylate) .... 1/2  by mouth once daily 6)  Zostavax 09811 Unt/0.49ml Solr (Zoster vaccine live) .[name].. 1 ml im x1 7)  Senior Multivitamin Plus Tabs (Multiple vitamins-minerals) 8)  Pravachol 40 Mg Tabs (Pravastatin sodium) .[name].. 1 by mouth at bedtime. 9)  Vitamin D3 1000 Unit Caps (Cholecalciferol) .[name].. 1 by mouth once daily  Patient Instructions: 1)  see dentist about possibility of TMJ 2)  stop boniva for now--- take calcium 1500 mg a day and take 1000u vita D3

## 2010-07-27 NOTE — Progress Notes (Signed)
Summary:  REACTION TO BP MEDTOO MUCH BP MED?//[facility]  Phone Note Call from Patient Call back at Home Phone [phone]   Caller: Patient Summary of Call: PT LEFT MSG HAVE QUESTIONS FOR BP MED, ON BENICAR AND NORVASC,FEELING LIKE IT MIGHT BE TOO MUCH FEEL LIGHT HEADED ONCE IN A WHILE NOT ALL THE TIME, BP AVERAGING 107/66 110/68, WONDERING DO I REALLY NEED THAT 2ND PILL Initial call taken by: Kandice Hams,  February 09, 2009 4:45 PM  Follow-up for Phone Call        break norvasc in half daily---  ov this week to check bp Follow-up by: Loreen Freud DO,  February 09, 2009 4:50 PM  Additional Follow-up for Phone Call Additional follow up Details #1::        pt aware....................Marland KitchenFelecia Deloach CMA  February 09, 2009 5:08 PM    New/Updated Medications: NORVASC 5 MG TABS (AMLODIPINE BESYLATE) 1/2  by mouth once daily

## 2010-07-27 NOTE — Assessment & Plan Note (Signed)
Summary: remove stitches.cbs   Vital Signs:  Patient Profile:   77 Years Old Female Temp:     98.3 degrees F oral                 PCP:  Jessica Mullins  Chief Complaint:  remove stiches .  History of Present Illness: pt here to have sutures removed------x5 with no complications---steri strips covered wound RTO as needed     Current Allergies: ! * SULPHA  Past Medical History:    Reviewed history from 10/18/2007 and no changes required:       Hypertension  Past Surgical History:    Reviewed history from 10/17/2006 and no changes required:       Hysterectomy    Risk Factors:   Review of Systems      See HPI     Complete Medication List: 1)  Boniva 150 Mg Tabs (Ibandronate sodium) .... Take 1 tab monthly 2)  Micardis 40 Mg Tabs (Telmisartan) .[name].. 1 by mouth once daily 3)  Prilosec Otc 20 Mg Tbec (Omeprazole magnesium) .... Take 1 tab prn 4)  Keflex 500 Mg Caps (Cephalexin) .[name].. 1 by mouth two times a day    ]

## 2010-07-27 NOTE — Letter (Signed)
Summary: Results Follow up Letter  [facility]  [address] [place], [place] [address]   Phone: [phone]  Fax: [phone]    04/16/2007 MRN: [id number]  Jessica Mullins [address] [place], [place]  [address]  Dear [name],  The following are the results of your recent test(s):  Test         Result    Pap Smear:        Normal _____  Not Normal _____ Comments: ______________________________________________________ Cholesterol: LDL(Bad cholesterol):         Your goal is less than:         HDL (Good cholesterol):       Your goal is more than: Comments:  ______________________________________________________ Mammogram:        Normal __X___  Not Normal _____ Comments:  ___________________________________________________________________ Hemoccult:        Normal _____  Not normal _______ Comments:    _____________________________________________________________________ Other Tests:    We routinely do not discuss normal results over the telephone.  If you desire a copy of the results, or you have any questions about this information we can discuss them at your next office visit.   Sincerely,

## 2010-07-27 NOTE — Letter (Signed)
Summary: Primary Care Appointment Letter  [facility]  [address] [place], [place] [address]   Phone: [phone]  Fax: [phone]    05/26/2010 MRN: [id number]  Jessica Mullins [address] [place], [place]  [address]  Dear Ms. Eveland,   Your Primary Care Physician Loreen Freud DO has indicated that:    _______it is time to schedule an appointment.    _______you missed your appointment on______ and need to call and          reschedule.    _ X_____you need to have lab work done.    _______you need to schedule an appointment discuss lab or test results.    _______you need to call to reschedule your appointment that is                       scheduled on _________.     Please call our office as soon as possible. Our phone number is [phone]. Please press option 1. Our office is open 8a-5p, Monday through Friday.     Thank you,    [facility]

## 2010-07-27 NOTE — Progress Notes (Signed)
Summary: JAW PAIN FROM BONIVA  Phone Note Call from Patient Call back at Home Phone [phone] Call back at Work Phone [phone]   Summary of Call: LEAVING AT 3:00 FROM HOME NUMBER--ONLY CAN LEAVE MESSAGE ON CELL PHONE  CONCERNED ABOUT BONIVA SIDE EFFECT--SHE HAD LEFT SIDE JAW PAIN OFF AND ON FOR LAST WEEK--TAKES TYLANOL ARTHRITIS--DOES RELIEVE PAIN  HAS NO OTHER SYMPTOMS ON LEFT SIDE---ARM AND CHEST NO PAIN  SCHEDULED TO TAKE BONIVA TOMORROW---IS JAW PAIN AN ACCEPTABLE SIDE EFFECT?? Initial call taken by: Jerolyn Shin,  February 05, 2010 9:38 AM  Follow-up for Phone Call        pls advise on side effect on meds...................Marland KitchenFelecia Deloach CMA  February 05, 2010 11:02 AM   Additional Follow-up for Phone Call Additional follow up Details #1::        jaw pain is a side effect of medication.  should stop med (don't take tomorrow) and schedule an appt w/ Dr Laury Axon to discuss alternatives. Additional Follow-up by: Neena Rhymes MD,  February 05, 2010 11:09 AM    Additional Follow-up for Phone Call Additional follow up Details #2::    DISCUSS WITH PATIENT..............Marland KitchenFelecia Deloach CMA  February 05, 2010 11:13 AM

## 2010-07-27 NOTE — Letter (Signed)
Summary: Results Follow up Letter  [facility]  [address] [place], [place] [address]   Phone: [phone]  Fax: [phone]    08/03/2007 MRN: [id number]  Jessica Mullins [address] [place], [place]  [address]  Dear Ms. Witczak,  The following are the results of your recent test(s):  Test         Result    Pap Smear:        Normal _____  Not Normal _____ Comments: ______________________________________________________ Cholesterol: LDL(Bad cholesterol):         Your goal is less than:         HDL (Good cholesterol):       Your goal is more than: Comments:  ______________________________________________________ Mammogram:        Normal _____  Not Normal _____ Comments:  ___________________________________________________________________ Hemoccult:        Normal _____  Not normal _______ Comments:    _____________________________________________________________________ Other Tests:  Bone Density test showed some softnen in bone continue Boniva and recheck in 2 years  We routinely do not discuss normal results over the telephone.  If you desire a copy of the results, or you have any questions about this information we can discuss them at your next office visit.   Sincerely,

## 2010-07-27 NOTE — Progress Notes (Signed)
Summary: RX  Phone Note Call from Patient Call back at Home Phone [phone]   Caller: Patient Reason for Call: Refill Medication Summary of Call:  PLEASE SEND TO [facility] FOR  AMLODIPINE 5 MG 1/2 TABS A DAY.P LEASE CALL Initial call taken by: Freddy Jaksch,  March 18, 2010 9:13 AM  Follow-up for Phone Call        I spoke with pt she is aware. Army Fossa CMA  March 18, 2010 10:04 AM     Prescriptions: NORVASC 5 MG TABS (AMLODIPINE BESYLATE) 1/2  by mouth once daily  #90 x 1   Entered by:   Army Fossa CMA   Authorized by:   Loreen Freud DO   Signed by:   Army Fossa CMA on 03/18/2010   Method used:   Faxed to ...       [facility] [employer] education officer)       [address] [address]       [place], [place]  [address]       Ph: [phone]       Fax: [phone]   RxID:   [id number]

## 2010-07-27 NOTE — Assessment & Plan Note (Signed)
Summary: rto 3 weeks.cbs   Vital Signs:  Patient profile:   77 year old female Weight:      164.1 pounds Pulse rate:   64 / minute Pulse rhythm:   regular Resp:     14 per minute BP sitting:   124 / 78  (right arm) Cuff size:   regular  Vitals Entered By: Ardyth Man (September 09, 2008 9:34 AM)   History of Present Illness: Pt here for labs and request shingles vaccine.  Her brother in law got shingles 3 days before he was supposed to get vaccine.  He is in [place].      Problems Prior to Update: 1)  Uti  (ICD-599.0) 2)  Sebaceous Cyst  (ICD-706.2) 3)  Hypertension  (ICD-401.9) 4)  Osteopenia  (ICD-733.90) 5)  Colonoscopy, Hx of  (ICD-V12.79) 6)  Breast Cyst  (ICD-610.0) 7)  Melanoma  (ICD-172.9)  Medications Prior to Update: 1)  Boniva 150 Mg  Tabs (Ibandronate Sodium) .... Take 1 Tab Monthly 2)  Benicar 40 Mg Tabs (Olmesartan Medoxomil) .[name].. 1 By Mouth Once Daily 3)  Prilosec Otc 20 Mg  Tbec (Omeprazole Magnesium) .... Take 1 Tab Prn 4)  Caltrate 600+d 600-400 Mg-Unit Tabs (Calcium Carbonate-Vitamin D) .... Daily 5)  Biotin .... Daily 6)  Norvasc 5 Mg Tabs (Amlodipine Besylate) .[name].. 1 By Mouth Qd  Current Medications (verified): 1)  Boniva 150 Mg  Tabs (Ibandronate Sodium) .... Take 1 Tab Monthly 2)  Benicar 40 Mg Tabs (Olmesartan Medoxomil) .[name].. 1 By Mouth Once Daily 3)  Prilosec Otc 20 Mg  Tbec (Omeprazole Magnesium) .... Take 1 Tab Prn 4)  Caltrate 600+d 600-400 Mg-Unit Tabs (Calcium Carbonate-Vitamin D) .... Daily 5)  Biotin .... Daily 6)  Norvasc 5 Mg Tabs (Amlodipine Besylate) .[name].. 1 By Mouth Qd 7)  Zostavax 54098 Unt/0.33ml Solr (Zoster Vaccine Live) .[name].. 1 Ml Im X1  Allergies (verified): 1)  ! * Sulpha  Past History:  Risk Factors:    Alcohol Use: N/A    >5 drinks/d w/in last 3 months: N/A    Caffeine Use: N/A    Diet: N/A    Exercise: N/A  Risk Factors:    Smoking Status: N/A    Packs/Day: N/A    Cigars/wk: N/A    Pipe Use/wk: N/A    Cans  of tobacco/wk: N/A    Passive Smoke Exposure: N/A  Past medical, surgical, family and social histories (including risk factors) reviewed, and no changes noted (except as noted below).  Past Medical History:    Reviewed history from 10/18/2007 and no changes required:    Hypertension  Past Surgical History:    Reviewed history from 10/17/2006 and no changes required:    Hysterectomy  Family History:    Reviewed history and no changes required:  Social History:    Reviewed history and no changes required:  Review of Systems      See HPI  Physical Exam  General:  Well-developed,well-nourished,in no acute distress; alert,appropriate and cooperative throughout examination Neck:  No deformities, masses, or tenderness noted. Lungs:  Normal respiratory effort, chest expands symmetrically. Lungs are clear to auscultation, no crackles or wheezes. Heart:  normal rate, regular rhythm, and no murmur.   Extremities:  No clubbing, cyanosis, edema, or deformity noted with normal full range of motion of all joints.   Skin:  Intact without suspicious lesions or rashes Psych:  Cognition and judgment appear intact. Alert and cooperative with normal attention span and concentration. No apparent delusions,  illusions, hallucinations   Impression & Recommendations:  Problem # 1:  HYPERTENSION (ICD-401.9)  Her updated medication list for this problem includes:    Benicar 40 Mg Tabs (Olmesartan medoxomil) .[name]... 1 by mouth once daily    Norvasc 5 Mg Tabs (Amlodipine besylate) .[name]... 1 by mouth qd  Orders: Venipuncture ([address]) TLB-Lipid Panel (80061-LIPID) TLB-BMP (Basic Metabolic Panel-BMET) (80048-METABOL) TLB-Hepatic/Liver Function Pnl (80076-HEPATIC)  BP today: 124/78 Prior BP: 142/82 (08/18/2008)  Labs Reviewed: Creat: 1.0 (01/30/2008) Chol: 205 (01/30/2008)   HDL: 45.7 (01/30/2008)   LDL: DEL (01/30/2008)   TG: 106 (01/30/2008)  Complete Medication List: 1)  Boniva 150 Mg Tabs  (Ibandronate sodium) .... Take 1 tab monthly 2)  Benicar 40 Mg Tabs (Olmesartan medoxomil) .[name].. 1 by mouth once daily 3)  Prilosec Otc 20 Mg Tbec (Omeprazole magnesium) .... Take 1 tab prn 4)  Caltrate 600+d 600-400 Mg-unit Tabs (Calcium carbonate-vitamin d) .... Daily 5)  Biotin  .... Daily 6)  Norvasc 5 Mg Tabs (Amlodipine besylate) .[name].. 1 by mouth qd 7)  Zostavax 81191 Unt/0.61ml Solr (Zoster vaccine live) .[name].. 1 ml im x1  Other Orders: T- * Misc. Laboratory test [phone]) Prescriptions: ZOSTAVAX 56213 UNT/0.65ML SOLR (ZOSTER VACCINE LIVE) 1 ml IM x1  #1 x 0   Entered and Authorized by:   Loreen Freud DO   Signed by:   Loreen Freud DO on 09/09/2008   Method used:   Print then Give to Patient   RxID:   [id number] NORVASC 5 MG TABS (AMLODIPINE BESYLATE) 1 by mouth qd  #90 x 3   Entered and Authorized by:   Loreen Freud DO   Signed by:   Loreen Freud DO on 09/09/2008   Method used:   Electronically to        [facility]* (mail-order)             ,          Ph: [phone]       Fax: [phone]   RxID:   [id number]

## 2010-07-27 NOTE — Miscellaneous (Signed)
Summary: Zostavax/[facility]   Imported By: Lanelle Bal 03/09/2010 09:51:10  _____________________________________________________________________  External Attachment:    Type:   Image     Comment:   External Document  Appended Document: Zostavax[facility]     Clinical Lists Changes  Observations: Added new observation of ZOSTAVAX: Zostavax (02/24/2010 21:22)       Immunization History:  Zostavax History:    Zostavax # 1:  Zostavax (02/24/2010)

## 2010-07-27 NOTE — Medication Information (Signed)
Summary: Prior Authorization for Diovan/[facility]  Prior Authorization for Diovan/[facility]   Imported By: Lanelle Bal 11/30/2009 12:44:48  _____________________________________________________________________  External Attachment:    Type:   Image     Comment:   External Document

## 2010-07-27 NOTE — Procedures (Signed)
Summary: Colonoscopy  Patient: Jessica Mullins Note: All result statuses are Final unless otherwise noted.  Tests: (1) Colonoscopy (COL)   COL Colonoscopy           DONE     [facility]     [address]     [place], [place]  [address]           COLONOSCOPY PROCEDURE REPORT           PATIENT:  Jessica Mullins, Jessica Mullins  MR#:  [id number]     BIRTHDATE:  March 03, 1931, 79 yrs. old  GENDER:  female     ENDOSCOPIST:  Iva Boop, MD, [facility]     REF. BY:  Loreen Freud, DO     PROCEDURE DATE:  01/12/2010     PROCEDURE:  Colonoscopy 13244     ASA CLASS:  Class II     INDICATIONS:  Routine Risk Screening     MEDICATIONS:   Fentanyl 50 mcg IV, Versed 6 mg IV           DESCRIPTION OF PROCEDURE:   After the risks benefits and     alternatives of the procedure were thoroughly explained, informed     consent was obtained.  Digital rectal exam was performed and     revealed no abnormalities.   The [id number] endoscope was     introduced through the anus and advanced to the cecum, which was     identified by both the appendix and ileocecal valve, without     limitations.  The quality of the prep was excellent, using     MoviPrep.  The instrument was then slowly withdrawn as the colon     was fully examined. Insertion: 6:28 minutes Withdrawal: 6:33     minutes     <<PROCEDUREIMAGES>>           FINDINGS:  A normal appearing cecum, ileocecal valve, and     appendiceal orifice were identified. The ascending, hepatic     flexure, transverse, splenic flexure, descending, sigmoid colon,     and rectum appeared unremarkable.   Retroflexed views in the     rectum revealed internal and external hemorrhoids.    The scope     was then withdrawn from the patient and the procedure completed.           COMPLICATIONS:  None     ENDOSCOPIC IMPRESSION:     1) Normal colon     2) Internal and external hemorrhoids in the anorectum     3) Excellent prep           REPEAT EXAM:  In for not necessary.       Iva Boop, MD, [facility]           CC:  Lelon Perla, DO and The Patient           n.     eSIGNED:   Iva Boop at 01/12/2010 09:03 AM           [place]ang[place], [place], [phone]  Note: An exclamation mark (!) indicates a result that was not dispersed into the flowsheet. Document Creation Date: 01/12/2010 9:03 AM _______________________________________________________________________  (1) Order result status: Final Collection or observation date-time: 01/12/2010 08:54 Requested date-time:  Receipt date-time:  Reported date-time:  Referring Physician:   Ordering Physician: Stan Head [phone]) Specimen Source:  Source: Launa Grill Order Number: [id number] Lab site:

## 2010-07-27 NOTE — Medication Information (Signed)
Summary: Medication Profile/[facility]  Medication Profile/[facility]   Imported By: Lanelle Bal 05/28/2009 08:58:40  _____________________________________________________________________  External Attachment:    Type:   Image     Comment:   External Document

## 2010-07-27 NOTE — Assessment & Plan Note (Signed)
Summary: h1n1.cbs   Nurse Visit    Prior Medications: BONIVA 150 MG  TABS (IBANDRONATE SODIUM) take 1 tab monthly BENICAR 40 MG TABS (OLMESARTAN MEDOXOMIL) 1 by mouth once daily PRILOSEC OTC 20 MG  TBEC (OMEPRAZOLE MAGNESIUM) take 1 tab prn CALTRATE 600+D 600-400 MG-UNIT TABS (CALCIUM CARBONATE-VITAMIN D) daily BIOTIN () Daily Current Allergies: ! * SULPHA   H1N1 # 1    Vaccine Type: H1N1 vaccine G code    Site: left deltoid    Mfr: Sanofi Pasteur    Dose: 0.5 ml    Route: IM    Given by: Floydene Flock CMA    Exp. Date: 11/02/2009    Lot #: [id number]   Orders Added: 1)  H1N1 vaccine G code Azizi.Borne    ]

## 2010-07-27 NOTE — Progress Notes (Signed)
Summary: Refill Request  Phone Note Refill Request Message from:  Patient on [facility] Fax #[phone]  Refills Requested: Medication #1:  BONIVA 150 MG TABS 1 by mouth monthly   Dosage confirmed as above?Dosage Confirmed  Medication #2:  NORVASC 5 MG TABS 1/2  by mouth once daily.   Dosage confirmed as above?Dosage Confirmed Patient needs to have her Boniva filled at [facility] on [address] [facility] told her thet never revcieved the rx's that were faxed back on 2-18 and now it wont get here in time for her to take it. She said you can go ahead and send a 3 month rx to the [facility] for the Boniva only.   Initial call taken by: Harold Barban,  August 26, 2009 2:19 PM    Prescriptions: NORVASC 5 MG TABS (AMLODIPINE BESYLATE) 1/2  by mouth once daily  #90 x 0   Entered by:   Army Fossa CMA   Authorized by:   Loreen Freud DO   Signed by:   Army Fossa CMA on 08/26/2009   Method used:   Faxed to ...       [facility])             , [place]         Ph: [phone]       Fax: [phone]   RxID:   [id number] BONIVA 150 MG TABS (IBANDRONATE SODIUM) 1 by mouth monthly  #12 x 0   Entered by:   Army Fossa CMA   Authorized by:   Loreen Freud DO   Signed by:   Army Fossa CMA on 08/26/2009   Method used:   Electronically to        [id number] (retail)       [address]       [place], [place]  [address]       Ph: [phone]       Fax: [phone]   RxID:   [id number]

## 2010-07-27 NOTE — Assessment & Plan Note (Signed)
Summary: flu shot//tl   Nurse Visit    Prior Medications: BONIVA 150 MG  TABS (IBANDRONATE SODIUM) take 1 tab every day BENICAR 40 MG  TABS (OLMESARTAN MEDOXOMIL) 1 by mouth once daily    Influenza Vaccine    Vaccine Type: Fluvax MCR    Site: right deltoid    Mfr: Sanofi Pasteur    Dose: 0.5 ml    Route: IM    Given by: Doristine Devoid    Exp. Date: 11/25/2008    Lot #: [id number]   Orders Added: 1)  Influenza Vaccine MCR [00025]    ]

## 2010-07-27 NOTE — Progress Notes (Signed)
Summary: NEEDS REFILLS FOR BONIVA AND [facility]VASC TO [facility]  Phone Note Call from Patient Call back at Home Phone [phone]   Caller: Patient Summary of Call: PATIENT WALKED IN TO TALK ABOUT HER FOSAMAX AND BONIVA---HAS DECIDED SHE WANTS TO STAY WITH THE BONIVA SINCE THE FOSOMAX GIVES HER A COUGH  PLEASE CALL IN (FAX IN) A PRESCRIPTION FOR 90 DAYS PLUS REFILLS FOR THE BONIVA  ALSO HAS BEEN CUTTING HER NORVASC--AMLODIPINE IN HALF SO NEEDS A PRESCRIPTION FOR GENERIC OR REGULAR NORVASC--WHATEVER SHE GOT BEFORE--(CANT REMEMBER IF SHE GOT GENERIC OR REGULAR BEFORE)  BUT SHE NEEDS IT TO BE STRENGTH OF 2.5 MG Initial call taken by: Jerolyn Shin,  August 14, 2009 10:19 AM  Follow-up for Phone Call        last bp follow up was 02/18/09- okay to fill? Army Fossa CMA  August 14, 2009 10:23 AM   Additional Follow-up for Phone Call Additional follow up Details #1::        she should have a bp f/u--- ok to refill x1 ok to fill boniva 1 monthly Additional Follow-up by: Loreen Freud DO,  August 14, 2009 10:38 AM    Additional Follow-up for Phone Call Additional follow up Details #2::    Tried to call pt- need pharm, no answering machine. Army Fossa CMA  August 14, 2009 11:53 AM   Additional Follow-up for Phone Call Additional follow up Details #3:: Details for Additional Follow-up Action Taken: informed pt of appt needed, meds sent in. Army Fossa CMA  August 14, 2009 1:55 PM   Prescriptions: NORVASC 5 MG TABS (AMLODIPINE BESYLATE) 1/2  by mouth once daily  #90 x 0   Entered by:   Army Fossa CMA   Authorized by:   Loreen Freud DO   Signed by:   Army Fossa CMA on 08/14/2009   Method used:   Faxed to ...       [facility])       [address] [address]       [place], [place]  [address]       Ph: [phone]       Fax: [phone]   RxID:   [id number] BONIVA 150 MG TABS (IBANDRONATE SODIUM) 1 by mouth monthly  #90 x 0   Entered by:   Army Fossa CMA   Authorized by:   Loreen Freud DO   Signed by:   Army Fossa CMA on 08/14/2009   Method used:   Faxed to ...       [facility] education [facility])       [address] [address]       [place], [place]  [address]       Ph: [phone]       Fax: [phone]   RxID:   [id number]

## 2010-07-27 NOTE — Progress Notes (Signed)
Summary: lowne-refill  Phone Note Refill Request Message from:  Fax from Pharmacy on [facility]  Refills Requested: Medication #1:  BONIVA 150 MG  TABS take 1 tab monthly  Medication #2:  BENICAR 40 MG TABS 1 by mouth once daily fax [phone]  Initial call taken by: [facility] CMA,  April 15, 2009 9:38 AM    Prescriptions: BENICAR 40 MG TABS (OLMESARTAN MEDOXOMIL) 1 by mouth once daily  #90 x 3   Entered by:   Shonna Chock   Authorized by:   Loreen Freud DO   Signed by:   Shonna Chock on 04/15/2009   Method used:   Print then Give to Patient   RxID:   [id number] BONIVA 150 MG  TABS (IBANDRONATE SODIUM) take 1 tab monthly  #12 x 3   Entered by:   Shonna Chock   Authorized by:   Loreen Freud DO   Signed by:   Shonna Chock on 04/15/2009   Method used:   Print then Give to Patient   RxID:   [id number]  Faxed to above number for [facility]  April 15, 2009 10:06 AM

## 2010-07-27 NOTE — Miscellaneous (Signed)
Summary: [facility]  Clinical Lists Changes  Medications: Added new medication of MOVIPREP 100 GM  SOLR (PEG-KCL-NACL-NASULF-NA ASC-C) As per prep instructions. - Signed Rx of MOVIPREP 100 GM  SOLR (PEG-KCL-NACL-NASULF-NA ASC-C) As per prep instructions.;  #1 x 0;  Signed;  Entered by: Ezra Sites RN;  Authorized by: Iva Boop MD, FACG;  Method used: Electronically to [facility]*, [address] [place], [place], [place]  [address], Ph: [phone], Fax: [phone] Allergies: Changed allergy or adverse reaction from * SULPHA to * SULPHA    Prescriptions: MOVIPREP 100 GM  SOLR (PEG-KCL-NACL-NASULF-NA ASC-C) As per prep instructions.  #1 x 0   Entered by:   Ezra Sites RN   Authorized by:   Iva Boop MD, [facility]   Signed by:   Ezra Sites RN on 12/30/2009   Method used:   Electronically to        [facility]* (retail)       [address]       [place], [place]  [address]       Ph: [phone]       Fax: [phone]   RxID:   [id number]

## 2010-07-27 NOTE — Progress Notes (Signed)
Summary: alternative med  Phone Note Refill Request Message from:  Fax from Pharmacy on [facility] [phone]  Refills Requested: Medication #1:  BONIVA 150 MG  TABS take 1 tab monthly  Medication #2:  BENICAR 40 MG TABS 1 by mouth once daily pt requesting a formulary alternative for treatment, requested drug: benicar, alternative :diovan,diovan hct, losartan potassium requested drug boniva, alternative: alendronate sodium............Marland KitchenFelecia Deloach CMA  April 15, 2009 3:49 PM    Follow-up for Phone Call        dr Laury Axon please advise on alternative med......Marland KitchenFelecia Deloach CMA  April 15, 2009 3:52 PM  pt ok with change of med....Marland KitchenMarland KitchenFelecia Deloach CMA  April 15, 2009 3:53 PM   Additional Follow-up for Phone Call Additional follow up Details #1::        diovan 160 #30  1 by mouth once daily  2 refills----ov 2-3 weeks to check bp fosamax 70 mg  #4 , 1 by mouth weekly --11 refills Additional Follow-up by: Loreen Freud DO,  April 15, 2009 4:21 PM    Additional Follow-up for Phone Call Additional follow up Details #2::    RX FAXED TO PHARMACY @ above fax number....................Marland KitchenFelecia Deloach CMA  April 16, 2009 8:32 AM   New/Updated Medications: FOSAMAX 70 MG TABS (ALENDRONATE SODIUM) Take 1 by mouth weekly DIOVAN 160 MG TABS (VALSARTAN) Take 1 by mouth once daily Prescriptions: FOSAMAX 70 MG TABS (ALENDRONATE SODIUM) Take 1 by mouth weekly  #12 x 4   Entered by:   Jeremy Johann CMA   Authorized by:   Loreen Freud DO   Signed by:   Jeremy Johann CMA on 04/16/2009   Method used:   Printed then faxed to ...       [facility] Environmental education officer)       [address] [address]       [place], [place]  [address]       Ph: [phone]       Fax: [phone]   RxID:   [id number] DIOVAN 160 MG TABS (VALSARTAN) Take 1 by mouth once daily  #90 x 0   Entered by:   Jeremy Johann CMA   Authorized by:   Loreen Freud DO   Signed by:   Jeremy Johann CMA on 04/16/2009   Method  used:   Printed then faxed to ...       [facility] Environmental education officer)       [address] [address]       [place], [place]  [address]       Ph: [phone]       Fax: [phone]   RxID:   [id number]

## 2010-07-27 NOTE — Miscellaneous (Signed)
Summary: [facility]  [facility]   Imported By: Lanelle Bal 04/05/2010 14:05:05  _____________________________________________________________________  External Attachment:    Type:   Image     Comment:   External Document  Appended Document: [facility]     Clinical Lists Changes  Observations: Added new observation of FLU VAX: Fluvax MCR (03/26/2010 15:11)       Immunization History:  Influenza Immunization History:    Influenza:  Fluvax MCR (03/26/2010)

## 2010-07-27 NOTE — Letter (Signed)
Summary: Primary Care Consult Scheduled Letter  [facility] [place]  [address] [place], [place] [address]   Phone: [phone]  Fax: [phone]      11/16/2009 MRN: [id number]  Jessica Mullins [address] [place], [place]  [address]    Dear [name],    We have scheduled an appointment for you.  At the recommendation of Dr. Loreen Freud, we have scheduled you for a Screening Colonoscopy with [facility].  Your initial Pre-Visit with a nurse is on 12-30-2009 at 9:00am.  Your Screening Colonoscopy is on 01-12-2010 arrive by 7:30am with Dr. Leone Payor.  Their address is [address], [place] [address]. The office phone number is [phone].  If this appointment day and time is not convenient for you, please feel free to call the office of the doctor you are being referred to at the number listed above and reschedule the appointment.    It is important for you to keep your scheduled appointments. We are here to make sure you are given good patient care.   Thank you,    Renee, Patient Care Coordinator [place] [facility]

## 2010-07-27 NOTE — Assessment & Plan Note (Signed)
Summary: FOLLOW UP//PH   Vital Signs:  Patient Profile:   77 Years Old Female Weight:      159.6 pounds Temp:     98.2 degrees F oral Pulse rate:   66 / minute BP sitting:   130 / 60  (left arm)  Pt. in pain?   no  Vitals Entered By: Jeremy Johann CMA (May 19, 2008 1:55 PM)                  PCP:  Laury Axon  Chief Complaint:  f/u bp.  History of Present Illness:  Hypertension Follow-Up      This is a 77 year old woman who presents for Hypertension follow-up.  The patient denies lightheadedness, urinary frequency, headaches, edema, impotence, rash, and fatigue.  The patient denies the following associated symptoms: chest pain, chest pressure, exercise intolerance, dyspnea, palpitations, syncope, leg edema, and pedal edema.  Compliance with medications (by patient report) has been sporadic.  The patient reports that dietary compliance has been good.  The patient reports exercising 3-4X per week.  Adjunctive measures currently used by the patient include salt restriction.      Current Allergies (reviewed today): ! * SULPHA  Past Medical History:    Reviewed history from 10/18/2007 and no changes required:       Hypertension  Past Surgical History:    Reviewed history from 10/17/2006 and no changes required:       Hysterectomy    Risk Factors:  Mammogram History:    Date of Last Mammogram:  05/06/2008  @  [facility]   Review of Systems      See HPI   Physical Exam  General:     Well-developed,well-nourished,in no acute distress; alert,appropriate and cooperative throughout examination Neck:     No deformities, masses, or tenderness noted. Lungs:     Normal respiratory effort, chest expands symmetrically. Lungs are clear to auscultation, no crackles or wheezes. Heart:     normal rate, regular rhythm, and Grade 2  /6 systolic ejection murmur.   Extremities:     No clubbing, cyanosis, edema, or deformity noted with normal full range of motion of all joints.    Cervical Nodes:     No lymphadenopathy noted Psych:     Cognition and judgment appear intact. Alert and cooperative with normal attention span and concentration. No apparent delusions, illusions, hallucinations    Impression & Recommendations:  Problem # 1:  HYPERTENSION (ICD-401.9)  Her updated medication list for this problem includes:    Benicar 40 Mg Tabs (Olmesartan medoxomil) .[name]... 1 by mouth once daily  BP today: 130/60 Prior BP: 130/90 (05/10/2008)  Labs Reviewed: Creat: 1.0 (01/30/2008) Chol: 205 (01/30/2008)   HDL: 45.7 (01/30/2008)   LDL: 131.1 (01/30/2008)   TG: 106 (01/30/2008)   Complete Medication List: 1)  Boniva 150 Mg Tabs (Ibandronate sodium) .... Take 1 tab monthly 2)  Benicar 40 Mg Tabs (Olmesartan medoxomil) .[name].. 1 by mouth once daily 3)  Prilosec Otc 20 Mg Tbec (Omeprazole magnesium) .... Take 1 tab prn 4)  Caltrate 600+d 600-400 Mg-unit Tabs (Calcium carbonate-vitamin d) .... Daily 5)  Biotin  .... Daily   Patient Instructions: 1)  rto 3 months   ]

## 2010-07-27 NOTE — Letter (Signed)
Summary: Results Follow-up Letter  [facility] [place]  [address] [place], [place] [address]   Phone: [phone]  Fax: [phone]    10/18/2007        [place] [address] [place], [place]  [address]  Dear Ms. Kadow,   The following are the results of your recent test(s):  Test     Result     Pap Smear    Normal_______  Not Normal_____       Comments: _________________________________________________________ Cholesterol LDL(Bad cholesterol):          Your goal is less than:         HDL (Good cholesterol):        Your goal is more than: _________________________________________________________ Other Tests:   _________________________________________________________  Please call for an appointment Or _Please see attached.________________________________________________________ _________________________________________________________ _________________________________________________________  Sincerely,  Ardyth Man [place] at [address] [facility]

## 2010-07-27 NOTE — Assessment & Plan Note (Signed)
Summary: CHECK BLOOD PRESSURE,NS FEE/RH.....   Vital Signs:  Patient profile:   77 year old female Height:      61 inches Weight:      162 pounds Temp:     98.4 degrees F oral Pulse rate:   52 / minute BP sitting:   120 / 90  (left arm)  Vitals Entered By: Jeremy Johann CMA (February 18, 2009 11:07 AM)  Serial Vital Signs/Assessments:  Time      Position  BP       Pulse  Resp  Temp     By 11:32 AM            110/80                         Loreen Freud DO  CC: bp check, check leg fell x 1week   History of Present Illness: Pt here for bp check and check L ankle.  Pt feel coming out of pharmacy and twisted ankle.  Pt went to [facility] and xrays were taken and pt givne ankle wrap to wear.    Current Medications (verified): 1)  Boniva 150 Mg  Tabs (Ibandronate Sodium) .... Take 1 Tab Monthly 2)  Benicar 40 Mg Tabs (Olmesartan Medoxomil) .[name].. 1 By Mouth Once Daily 3)  Prilosec Otc 20 Mg  Tbec (Omeprazole Magnesium) .... Take 1 Tab Prn 4)  Caltrate 600+d 600-400 Mg-Unit Tabs (Calcium Carbonate-Vitamin D) .... Daily 5)  Biotin .... Daily 6)  Norvasc 5 Mg Tabs (Amlodipine Besylate) .... 1/2  By Mouth Once Daily  Allergies: 1)  ! * Sulpha  Past History:  Past medical, surgical, family and social histories (including risk factors) reviewed, and no changes noted (except as noted below).  Past Medical History: Reviewed history from 10/18/2007 and no changes required. Hypertension  Past Surgical History: Reviewed history from 10/17/2006 and no changes required. Hysterectomy  Family History: Reviewed history and no changes required.  Social History: Reviewed history and no changes required.  Review of Systems      See HPI  Physical Exam  General:  Well-developed,well-nourished,in no acute distress; alert,appropriate and cooperative throughout examination Lungs:  Normal respiratory effort, chest expands symmetrically. Lungs are clear to auscultation, no crackles or wheezes.  Heart:  Normal rate and regular rhythm. S1 and S2 normal without gallop, murmur, click, rub or other extra sounds. Extremities:  L ankle --+ swellng L lat  toes_+  eccymosis Psych:  Cognition and judgment appear intact. Alert and cooperative with normal attention span and concentration. No apparent delusions, illusions, hallucinations   Impression & Recommendations:  Problem # 1:  HYPERTENSION (ICD-401.9)  Her updated medication list for this problem includes:    Benicar 40 Mg Tabs (Olmesartan medoxomil) .[name]... 1 by mouth once daily    Norvasc 5 Mg Tabs (Amlodipine besylate) .[name]... 1/2  by mouth once daily  BP today: 120/90 Prior BP: 100/62 (01/26/2009)  Labs Reviewed: K+: 5.1 (09/09/2008) Creat: : 0.7 (09/09/2008)   Chol: 227 (09/09/2008)   HDL: 62.20 (09/09/2008)   LDL: DEL (01/30/2008)   TG: 93.0 (09/09/2008)  Problem # 2:  ANKLE SPRAIN (ICD-845.00)  Instructed to use a compression wrap, elevate the affected area, apply ICE for 20 minutes every hour while awake for next 3 days, and rest. Start physical therapy as directed and recheck in 10-14 days if no improvement, sooner if worse.  Complete Medication List: 1)  Boniva 150 Mg Tabs (Ibandronate sodium) .... Take 1  tab monthly 2)  Benicar 40 Mg Tabs (Olmesartan medoxomil) .[name].. 1 by mouth once daily 3)  Prilosec Otc 20 Mg Tbec (Omeprazole magnesium) .... Take 1 tab prn 4)  Caltrate 600+d 600-400 Mg-unit Tabs (Calcium carbonate-vitamin d) .... Daily 5)  Biotin  .... Daily 6)  Norvasc 5 Mg Tabs (Amlodipine besylate) .... 1/2  by mouth once daily

## 2010-07-27 NOTE — Letter (Signed)
Summary: Results Follow-up Letter  [facility] [place]  [address]   [place], [place] [address]   Phone: [phone]  Fax: [phone]    02/11/2008         Ambrie Schaible [address] [place], [place]  [address]  Dear Ms. Gieselman,   The following are the results of your recent test(s):  Test     Result     Pap Smear    Normal_______  Not Normal_____       Comments: _________________________________________________________ Cholesterol LDL(Bad cholesterol):          Your goal is less than:         HDL (Good cholesterol):        Your goal is more than: _________________________________________________________ Other Tests:   _________________________________________________________  Please call for an appointment Or _Please see attached lab report.________________________________________________________ _________________________________________________________ _________________________________________________________  Sincerely,  Michelle Utrera [facility] at [place]          

## 2010-07-27 NOTE — Progress Notes (Signed)
Summary:  ? med change  Phone Note Call from Patient Call back at Cell: [phone]   Caller: Patient Summary of Call: Pt left VM that she has question about a change to fosamax to boniva. Pt would like a call back to discuss this matter.................Marland KitchenFelecia Deloach CMA  June 05, 2009 11:43 AM   tried to contact pt no answer or machine to leave message will try again later...............Marland KitchenFelecia Deloach CMA  June 05, 2009 11:47 AM   Follow-up for Phone Call        Patient is on Boniva x 2 years. Patient said she received three packages of Foasmax, and it stated that Dr.Lowne changed and the patient is confused as to why she would be switched back to Fosamax because she had a cough with the medication before.    **Dr.Lowne please advise and forward back to triage nurse** Follow-up by: Shonna Chock,  June 05, 2009 12:21 PM  Additional Follow-up for Phone Call Additional follow up Details #1::        Pt called on 10/20 requesting it---see phone note.  we can change it back--- if we have samples of boniva please give her 3 tabs.    Additional Follow-up by: Loreen Freud DO,  June 05, 2009 12:34 PM    Additional Follow-up for Phone Call Additional follow up Details #2::    left message to call office.....................Marland KitchenFelecia Deloach CMA  June 05, 2009 12:50 PM   Left message on machine for patient to return call when avaliable, Reason for call: Discuss meds./Chrae Malloy  June 05, 2009 2:27 PM   Patient said she would like to stay on the boniva. Patient said she is not due for a refill and will call once she is due for it./Chrae [facility]  June 05, 2009 3:09 PM   New/Updated Medications: BONIVA 150 MG TABS (IBANDRONATE SODIUM) 1 by mouth monthly

## 2010-07-27 NOTE — Letter (Signed)
Summary: Results Follow-up Letter  [place] [facility]  [address] [place], [place] [address]   Phone: [phone]  Fax: [phone]    05/08/2008        Jessica Mullins [address] [place], [place]  [address]  Dear Ms. Grist,   The following are the results of your recent test(s):  Test     Result     Pap Smear    Normal_______  Not Normal_____       Comments: _________________________________________________________ Cholesterol LDL(Bad cholesterol):          Your goal is less than:         HDL (Good cholesterol):        Your goal is more than: _________________________________________________________ Other Tests:   _________________________________________________________  Please call for an appointment Or NORMAL MAMMOGRAM._________________________________________________________ _________________________________________________________ _________________________________________________________  Sincerely,  Ardyth Man Bassett at [facility]

## 2010-07-27 NOTE — Assessment & Plan Note (Signed)
Summary: 3 MONTH ROA//VGJ   Vital Signs:  Patient Profile:   77 Years Old Female Weight:      158.19 pounds Temp:     98.3 degrees F oral Pulse rate:   68 / minute Resp:     14 per minute BP sitting:   130 / 72  (right arm)  Pt. in pain?   no  Vitals Entered By: Ardyth Man (January 29, 2008 12:49 PM)                  PCP:  Laury Axon  Chief Complaint:  3 month follow up blood work dm and cholesterol.  History of Present Illness: Pt here c/o cyst in groin.  It's been there months and months.  Hypertension Follow-Up      This is a 77 year old woman who presents for Hypertension follow-up.  The patient denies lightheadedness, urinary frequency, headaches, edema, impotence, rash, and fatigue.  The patient denies the following associated symptoms: chest pain, chest pressure, exercise intolerance, dyspnea, palpitations, syncope, leg edema, and pedal edema.  Compliance with medications (by patient report) has been near 100%.  The patient reports exercising occasionally.  Adjunctive measures currently used by the patient include salt restriction.      Current Allergies: ! * SULPHA      Physical Exam  General:     Well-developed,well-nourished,in no acute distress; alert,appropriate and cooperative throughout examination Lungs:     Normal respiratory effort, chest expands symmetrically. Lungs are clear to auscultation, no crackles or wheezes. Heart:     normal rate, regular rhythm, and no murmur.   Abdomen:     R side low abd.---+ sebaceous cyst Extremities:     No clubbing, cyanosis, edema, or deformity noted with normal full range of motion of all joints.   Psych:     Oriented X3, memory intact for recent and remote, and normally interactive.      Impression & Recommendations:  Problem # 1:  HYPERTENSION (ICD-401.9)  Her updated medication list for this problem includes:    Benicar 40 Mg Tabs (Olmesartan medoxomil) .[name]... 1 by mouth once daily  BP today: 130/72  Prior BP: 140/82 (10/18/2007)  Labs Reviewed: Creat: 0.7 (10/18/2007)   Problem # 2:  SEBACEOUS CYST (ICD-706.2) Pt will make appoint. for I and D  Complete Medication List: 1)  Boniva 150 Mg Tabs (Ibandronate sodium) .... Take 1 tab monthly 2)  Benicar 40 Mg Tabs (Olmesartan medoxomil) .[name].. 1 by mouth once daily   Patient Instructions: 1)  fasting labs  401.9  bmp, lipid, hep   ]

## 2010-07-27 NOTE — Progress Notes (Signed)
Summary: prior auth APPROVED DIOVAN EXPRESS SCRIPT  Phone Note Refill Request   Refills Requested: Medication #1:  DIOVAN 160 MG TABS Take 1 by mouth once daily prior auth  [phone] NW#[id number]  Initial call taken by: [facility] CMA,  Nov 20, 2009 11:58 AM  Follow-up for Phone Call        PRIOR AUTH APPROVED 11-20-09 UNTIL 11-20-10.......Marland KitchenFelecia Deloach CMA  Nov 20, 2009 11:58 AM

## 2010-07-27 NOTE — Letter (Signed)
Summary: Results Follow-up Letter  [facility] [place]  [address] [place], [place] [address]   Phone: [phone]  Fax: [phone]    02/11/2008         [place] [address] [place], [place]  [address]  Dear Ms. Casique,   The following are the results of your recent test(s):  Test     Result     Pap Smear    Normal_______  Not Normal_____       Comments: _________________________________________________________ Cholesterol LDL(Bad cholesterol):          Your goal is less than:         HDL (Good cholesterol):        Your goal is more than: _________________________________________________________ Other Tests:   _________________________________________________________  Please call for an appointment Or _Please see attached lab report.________________________________________________________ _________________________________________________________ _________________________________________________________  Sincerely,  Ardyth Man Cricket at [facility]

## 2010-07-27 NOTE — Progress Notes (Signed)
  Phone Note Outgoing Call Call back at [facility] Phone [phone]   Call placed by: Ardyth Man,  May 13, 2008 3:42 PM Call placed to: Patient Summary of Call: ________________________________________________________________________ treated with cipro Patient aware Ardyth Man  May 13, 2008 3:43 PM

## 2010-07-27 NOTE — Letter (Signed)
Summary: Comprehensive Outpatient Surge Instructions  [facility]  [address] [place], [place] [address]   Phone: [phone]  Fax: [phone]       Jessica Mullins    [birth year]    MRN: [id number]        Procedure Day /Date:  Tuesday 01/03/2010     Arrival Time: 7:30 am      Procedure Time: 8:30 am     Location of Procedure:                    _x _  [facility] (4th Floor)                        PREPARATION FOR COLONOSCOPY WITH MOVIPREP   Starting 5 days prior to your procedure Thursday 7/14 do not eat nuts, seeds, popcorn, corn, beans, peas,  salads, or any raw vegetables.  Do not take any fiber supplements (e.g. Metamucil, Citrucel, and Benefiber).  THE DAY BEFORE YOUR PROCEDURE         DATE: Monday 7/18 1.  Drink clear liquids the entire day-NO SOLID FOOD  2.  Do not drink anything colored red or purple.  Avoid juices with pulp.  No orange juice.  3.  Drink at least 64 oz. (8 glasses) of fluid/clear liquids during the day to prevent dehydration and help the prep work efficiently.  CLEAR LIQUIDS INCLUDE: Water Jello Ice Popsicles Tea (sugar ok, no milk/cream) Powdered fruit flavored drinks Coffee (sugar ok, no milk/cream) Gatorade Juice: apple, white grape, white cranberry  Lemonade Clear bullion, consomm, broth Carbonated beverages (any kind) Strained chicken noodle soup Hard Candy                             4.  In the morning, mix first dose of MoviPrep solution:    Empty 1 Pouch A and 1 Pouch B into the disposable container    Add lukewarm drinking water to the top line of the container. Mix to dissolve    Refrigerate (mixed solution should be used within 24 hrs)  5.  Begin drinking the prep at 5:00 p.m. The MoviPrep container is divided by 4 marks.   Every 15 minutes drink the solution down to the next mark (approximately 8 oz) until the full liter is complete.   6.  Follow completed prep with 16 oz of clear liquid of your choice (Nothing  red or purple).  Continue to drink clear liquids until bedtime.  7.  Before going to bed, mix second dose of MoviPrep solution:    Empty 1 Pouch A and 1 Pouch B into the disposable container    Add lukewarm drinking water to the top line of the container. Mix to dissolve    Refrigerate  THE DAY OF YOUR PROCEDURE      DATE: Tuesday 7/19  Beginning at 3:30 a.m. (5 hours before procedure):         1. Every 15 minutes, drink the solution down to the next mark (approx 8 oz) until the full liter is complete.  2. Follow completed prep with 16 oz. of clear liquid of your choice.    3. You may drink clear liquids until 6:30 am (2 HOURS BEFORE PROCEDURE).   MEDICATION INSTRUCTIONS  Unless otherwise instructed, you should take regular prescription medications with a small sip of water   as early as possible the morning of your  procedure.           OTHER INSTRUCTIONS  You will need a responsible adult at least 75 years of age to accompany you and drive you home.   This person must remain in the waiting room during your procedure.  Wear loose fitting clothing that is easily removed.  Leave jewelry and other valuables at home.  However, you may wish to bring a book to read or  an iPod/MP3 player to listen to music as you wait for your procedure to start.  Remove all body piercing jewelry and leave at home.  Total time from sign-in until discharge is approximately 2-3 hours.  You should go home directly after your procedure and rest.  You can resume normal activities the  day after your procedure.  The day of your procedure you should not:   Drive   Make legal decisions   Operate machinery   Drink alcohol   Return to work  You will receive specific instructions about eating, activities and medications before you leave.    The above instructions have been reviewed and explained to me by  Ezra Sites RN  December 30, 2009 9:15 AM     I fully understand and can  verbalize these instructions _____________________________ Date _________

## 2010-08-09 ENCOUNTER — Ambulatory Visit (INDEPENDENT_AMBULATORY_CARE_PROVIDER_SITE_OTHER): Payer: Medicare PPO | Admitting: Family Medicine

## 2010-08-09 ENCOUNTER — Encounter: Payer: Self-pay | Admitting: Family Medicine

## 2010-08-09 DIAGNOSIS — J019 Acute sinusitis, unspecified: Secondary | ICD-10-CM

## 2010-08-18 NOTE — Assessment & Plan Note (Signed)
Summary: COUGH & HEAD CONGESTION/RH....   Vital Signs:  Patient profile:   77 year old female Weight:      160 pounds BMI:     30.34 Temp:     98.9 degrees F oral BP sitting:   120 / 80  (left arm)  Vitals Entered By: Doristine Devoid CMA (August 09, 2010 9:35 AM) CC: sinus HA and congestion along w/ cough xwed.    History of Present Illness: 77 yo woman here today for ? sinus infxn.  sxs started 5-6 days ago.  used OTC sinus and cold medicine w/out relief.  now w/ chest congestion, cough is intermittantly productive, + facial pain.  + subjective fevers.  no ear pain.  + sore throat.  + sick contacts.  Current Medications (verified): 1)  Diovan 160 Mg Tabs (Valsartan) .... Take 1 By Mouth Once Daily 2)  Prilosec Otc 20 Mg  Tbec (Omeprazole Magnesium) .... Take 1 Tab Prn 3)  Caltrate 600+d 600-400 Mg-Unit Tabs (Calcium Carbonate-Vitamin D) .... Daily 4)  Biotin .... Daily 5)  Norvasc 5 Mg Tabs (Amlodipine Besylate) .... 1/2  By Mouth Once Daily 6)  Zostavax 24401 Unt/0.86ml Solr (Zoster Vaccine Live) .[name].. 1 Ml Im X1 7)  Senior Multivitamin Plus  Tabs (Multiple Vitamins-Minerals) 8)  Pravachol 40 Mg Tabs (Pravastatin Sodium) .[name].. 1 By Mouth At Bedtime. 9)  Vitamin D3 1000 Unit Caps (Cholecalciferol) .[name].. 1 By Mouth Once Daily  Allergies (verified): 1)  ! * Sulpha  Review of Systems      See HPI  Physical Exam  General:  Well-developed,well-nourished,in no acute distress; alert,appropriate and cooperative throughout examination Head:  + TTP over L maxillary sinus Eyes:  no injxn or inflammation Ears:  External ear exam shows no significant lesions or deformities.  Otoscopic examination reveals clear canals, tympanic membranes are intact bilaterally without bulging, retraction, inflammation or discharge. Hearing is grossly normal bilaterally. Nose:  External nasal examination shows no deformity or inflammation. Nasal mucosa are pink and moist without lesions or exudates. Mouth:   Oral mucosa and oropharynx without lesions or exudates.  Teeth in good repair.  + PND Neck:  No deformities, masses, or tenderness noted. Lungs:  Normal respiratory effort, chest expands symmetrically. Lungs are clear to auscultation, no crackles or wheezes. Heart:  Normal rate and regular rhythm. S1 and S2 normal without gallop, murmur, click, rub or other extra sounds.   Impression & Recommendations:  Problem # 1:  SINUSITIS - ACUTE-NOS (ICD-461.9) Assessment New pt's sxs and PE consistent w/ infxn.  start amox.  cough meds as needed.  reviewed supportive care and red flags that should prompt return.  Pt expresses understanding and is in agreement w/ this plan. Her updated medication list for this problem includes:    Amoxicillin 875 Mg Tabs (Amoxicillin) .[name]... 1 tab by mouth two times a day x10 days.  take w/ food.    Tessalon 200 Mg Caps (Benzonatate) .[name]... Take one capsule by mouth three times a day as needed for cough  Complete Medication List: 1)  Diovan 160 Mg Tabs (Valsartan) .... Take 1 by mouth once daily 2)  Prilosec Otc 20 Mg Tbec (Omeprazole magnesium) .... Take 1 tab prn 3)  Caltrate 600+d 600-400 Mg-unit Tabs (Calcium carbonate-vitamin d) .... Daily 4)  Biotin  .... Daily 5)  Norvasc 5 Mg Tabs (Amlodipine besylate) .... 1/2  by mouth once daily 6)  Zostavax 02725 Unt/0.18ml Solr (Zoster vaccine live) .[name].. 1 ml im x1 7)  Senior  Multivitamin Plus Tabs (Multiple vitamins-minerals) 8)  Pravachol 40 Mg Tabs (Pravastatin sodium) .[name].. 1 by mouth at bedtime. 9)  Vitamin D3 1000 Unit Caps (Cholecalciferol) .[name].. 1 by mouth once daily 10)  Amoxicillin 875 Mg Tabs (Amoxicillin) .[name].. 1 tab by mouth two times a day x10 days.  take w/ food. 11)  Tessalon 200 Mg Caps (Benzonatate) .... Take one capsule by mouth three times a day as needed for cough  Patient Instructions: 1)  You have a sinus infection 2)  Take the Amoxicillin as directed- take w/ food to avoid upset stomach 3)  Drink  plenty of fluids 4)  Tylenol or ibuprofen as needed for pain or fever 5)  Use the cough pills as needed 6)  Add Mucinex to thin your congestion 7)  REST! 8)  Hang in there! Prescriptions: TESSALON 200 MG CAPS (BENZONATATE) Take one capsule by mouth three times a day as needed for cough  #60 x 0   Entered and Authorized by:   Neena Rhymes MD   Signed by:   Neena Rhymes MD on 08/09/2010   Method used:   Electronically to        [facility] [phone]* (retail)       [address]       [place], [place]  [address]       Ph: [phone]       Fax: [phone]   RxID:   [id number] AMOXICILLIN 875 MG TABS (AMOXICILLIN) 1 tab by mouth two times a day x10 days.  take w/ food.  #20 x 0   Entered and Authorized by:   Neena Rhymes MD   Signed by:   Neena Rhymes MD on 08/09/2010   Method used:   Electronically to        [facility] [phone]* (retail)       [address]       [place], [place]  [address]       Ph: [phone]       Fax: [phone]   RxID:   [id number]    Orders Added: 1)  Est. Patient Level III [[address]]

## 2010-09-18 ENCOUNTER — Other Ambulatory Visit: Payer: Self-pay | Admitting: Family Medicine

## 2010-09-20 ENCOUNTER — Other Ambulatory Visit: Payer: Self-pay | Admitting: Family Medicine

## 2010-09-21 MED ORDER — PRAVASTATIN SODIUM 40 MG PO TABS: 40.0000 mg | ORAL_TABLET | Freq: Every day | ORAL | Status: DC

## 2010-11-09 NOTE — Assessment & Plan Note (Signed)
[facility]                                 ON-CALL NOTE   NAME:BERGSTEINKamila, Broda                       MRN:          [id number]  DATE:05/09/2008                            DOB:          1931-02-28    Time is 8:14 p.m.   PHONE NUMBER:  [phone].   Dr. Laury Axon is her regular doctor and  I am Dr. Milinda Antis on call.   CHIEF COMPLAINT:  Urine urgency.   The patient said she thinks she might have urinary tract infection.  She  has had increased urgency to urinate, but not a lot of volume when she  goes.  She is noticing a very faint pink color to her urine.  She denies  a lot of bladder pain.  She denies nausea, vomiting, or fever.  I  advised to her that this is likely urinary tract infection.  If her  symptoms worsen tonight or she develops nausea or vomiting, I advised  her to go to the emergency room for evaluation.  Otherwise, she will  plan to come in the office to get evaluated tomorrow at [facility].  I have told her we would give her a call back with directions in the  morning because she has not been there before.     Marne A. Tower, MD  Electronically Signed    MAT/MedQ  DD: 05/09/2008  DT: 05/10/2008  Job #: [id number]

## 2010-11-17 ENCOUNTER — Telehealth: Payer: Self-pay

## 2010-11-17 MED ORDER — PRAVASTATIN SODIUM 40 MG PO TABS: 40.0000 mg | ORAL_TABLET | Freq: Every day | ORAL | Status: DC

## 2010-11-17 NOTE — Telephone Encounter (Signed)
Call from patient and she stated she needed labs. I went ahead and scheduled her cpx with fasting labs    KP

## 2010-11-19 ENCOUNTER — Other Ambulatory Visit: Payer: Self-pay | Admitting: *Deleted

## 2010-11-19 MED ORDER — VALSARTAN 160 MG PO TABS: 160.0000 mg | ORAL_TABLET | Freq: Every day | ORAL | Status: DC

## 2010-11-23 ENCOUNTER — Other Ambulatory Visit: Payer: Self-pay | Admitting: Family Medicine

## 2010-11-23 MED ORDER — VALSARTAN 160 MG PO TABS: 160.0000 mg | ORAL_TABLET | Freq: Every day | ORAL | Status: DC

## 2010-11-24 ENCOUNTER — Encounter: Payer: Self-pay | Admitting: Family Medicine

## 2010-12-01 ENCOUNTER — Encounter: Payer: Self-pay | Admitting: Family Medicine

## 2010-12-02 ENCOUNTER — Ambulatory Visit (INDEPENDENT_AMBULATORY_CARE_PROVIDER_SITE_OTHER): Payer: Medicare PPO | Admitting: Family Medicine

## 2010-12-02 ENCOUNTER — Encounter: Payer: Self-pay | Admitting: Family Medicine

## 2010-12-02 VITALS — BP 122/76 | HR 52 | Temp 97.3°F | Ht 61.25 in | Wt 158.8 lb

## 2010-12-02 DIAGNOSIS — I10 Essential (primary) hypertension: Secondary | ICD-10-CM

## 2010-12-02 DIAGNOSIS — E785 Hyperlipidemia, unspecified: Secondary | ICD-10-CM

## 2010-12-02 DIAGNOSIS — Z0000 Encounter for general adult medical examination without abnormal findings: Secondary | ICD-10-CM

## 2010-12-02 LAB — CBC WITH DIFFERENTIAL/PLATELET
Basophils Absolute: 0 10*3/uL (ref 0.0–0.1)
Basophils Relative: 0.5 % (ref 0.0–3.0)
Eosinophils Absolute: 0 10*3/uL (ref 0.0–0.7)
Eosinophils Relative: 0.5 % (ref 0.0–5.0)
HCT: 44.5 % (ref 36.0–46.0)
Hemoglobin: 15.4 g/dL — ABNORMAL HIGH (ref 12.0–15.0)
Lymphocytes Relative: 30.9 % (ref 12.0–46.0)
Lymphs Abs: 2.7 10*3/uL (ref 0.7–4.0)
MCHC: 34.6 g/dL (ref 30.0–36.0)
MCV: 92.4 fl (ref 78.0–100.0)
Monocytes Absolute: 0.4 10*3/uL (ref 0.1–1.0)
Monocytes Relative: 4.4 % (ref 3.0–12.0)
Neutro Abs: 5.6 10*3/uL (ref 1.4–7.7)
Neutrophils Relative %: 63.7 % (ref 43.0–77.0)
Platelets: 216 10*3/uL (ref 150.0–400.0)
RBC: 4.81 Mil/uL (ref 3.87–5.11)
RDW: 13.8 % (ref 11.5–14.6)
WBC: 8.8 10*3/uL (ref 4.5–10.5)

## 2010-12-02 LAB — BASIC METABOLIC PANEL
BUN: 14 mg/dL (ref 6–23)
CO2: 28 mEq/L (ref 19–32)
Calcium: 9.2 mg/dL (ref 8.4–10.5)
Chloride: 100 mEq/L (ref 96–112)
Creatinine, Ser: 0.8 mg/dL (ref 0.4–1.2)
GFR: 72.69 mL/min (ref >60.00)
Glucose, Bld: 82 mg/dL (ref 70–99)
Potassium: 3.8 mEq/L (ref 3.5–5.1)
Sodium: 142 mEq/L (ref 135–145)

## 2010-12-02 LAB — LIPID PANEL
Cholesterol: 160 mg/dL (ref 0–200)
HDL: 62.3 mg/dL (ref >39.00)
LDL Cholesterol: 80 mg/dL (ref 0–99)
Total CHOL/HDL Ratio: 3
Triglycerides: 88 mg/dL (ref 0.0–149.0)
VLDL: 17.6 mg/dL (ref 0.0–40.0)

## 2010-12-02 LAB — HEPATIC FUNCTION PANEL
ALT: 20 U/L (ref 0–35)
AST: 24 U/L (ref 0–37)
Albumin: 4.4 g/dL (ref 3.5–5.2)
Alkaline Phosphatase: 60 U/L (ref 39–117)
Bilirubin, Direct: 0.1 mg/dL (ref 0.0–0.3)
Total Bilirubin: 0.8 mg/dL (ref 0.3–1.2)
Total Protein: 7.1 g/dL (ref 6.0–8.3)

## 2010-12-02 MED ORDER — PRAVASTATIN SODIUM 40 MG PO TABS: 40.0000 mg | ORAL_TABLET | Freq: Every day | ORAL | Status: DC

## 2010-12-02 MED ORDER — VALSARTAN 160 MG PO TABS: 160.0000 mg | ORAL_TABLET | Freq: Every day | ORAL | Status: DC

## 2010-12-02 MED ORDER — AMLODIPINE BESYLATE 5 MG PO TABS: 5.0000 mg | ORAL_TABLET | Freq: Every day | ORAL | Status: DC

## 2010-12-02 NOTE — Progress Notes (Signed)
  Subjective:     Jessica Mullins is a 77 y.o. female and is here for a comprehensive physical exam. The patient reports no problems.  History   Social History  . Marital Status: Widowed    Spouse Name: N/A    Number of Children: N/A  . Years of Education: N/A   Occupational History  . teacher     retired   Social History Main Topics  . Smoking status: Never Smoker   . Smokeless tobacco: Never Used  . Alcohol Use: Yes  . Drug Use: No  . Sexually Active: Not on file   Other Topics Concern  . Not on file   Social History Narrative  . No narrative on file   Health Maintenance  Topic Date Due  . Pneumococcal Polysaccharide Vaccine Age 13 And Over  12/07/1997  . Influenza Vaccine  03/28/2011  . Tetanus/tdap  11/04/2013  . Colonoscopy  01/13/2020  . Zostavax  Completed    The following portions of the patient's history were reviewed and updated as appropriate: allergies, current medications, past family history, past medical history, past social history, past surgical history and problem list.  Review of Systems  Review of Systems  Constitutional: Negative for activity change, appetite change and fatigue.  HENT: Negative for hearing loss, congestion, tinnitus and ear discharge.  dentist [phone] Eyes: Negative for visual disturbance (see optho q1y -- vision corrected to 20/20 with glasses).  Respiratory: Negative for cough, chest tightness and shortness of breath.   Cardiovascular: Negative for chest pain, palpitations and leg swelling.  Gastrointestinal: Negative for abdominal pain, diarrhea, constipation and abdominal distention.  Genitourinary: Negative for urgency, frequency, decreased urine volume and difficulty urinating.  Musculoskeletal: Negative for back pain, arthralgias and gait problem.  Skin: Negative for color change, pallor and rash.  Neurological: Negative for dizziness, light-headedness, numbness and headaches.  Hematological: Negative for adenopathy. Does not  bruise/bleed easily.  Psychiatric/Behavioral: Negative for suicidal ideas, confusion, sleep disturbance, self-injury, dysphoric mood, decreased concentration and agitation.  Pt is able to read and write and can do all ADLs No risk for falling No abuse/ violence in home  optho--[facility]--Dr Margo Aye  Objective:    BP 122/76  Pulse 52  Temp(Src) 97.3 F (36.3 C) (Oral)  Ht 5' 1.25" (1.556 m)  Wt 158 lb 12.8 oz (72.031 kg)  BMI 29.76 kg/m2  SpO2 97% General appearance: alert, cooperative, appears stated age and no distress Head: Normocephalic, without obvious abnormality, atraumatic Eyes: conjunctivae/corneas clear. PERRL, EOM's intact. Fundi benign. Ears: normal TM's and external ear canals both ears Nose: Nares normal. Septum midline. Mucosa normal. No drainage or sinus tenderness. Throat: lips, mucosa, and tongue normal; teeth and gums normal Neck: no adenopathy, no carotid bruit, no JVD, supple, symmetrical, trachea midline and thyroid not enlarged, symmetric, no tenderness/mass/nodules Lungs: clear to auscultation bilaterally Breasts: normal appearance, no masses or tenderness Heart: regular rate and rhythm, S1, S2 normal, no murmur, click, rub or gallop Abdomen: soft, non-tender; bowel sounds normal; no masses,  no organomegaly Pelvic: uterus surgically absent Extremities: extremities normal, atraumatic, no cyanosis or edema Pulses: 2+ and symmetric Skin: Skin color, texture, turgor normal. No rashes or lesions Lymph nodes: Cervical, supraclavicular, and axillary nodes normal. Neurologic: Grossly normal Psych-- not suicidal,  No depression or anxiety   Assessment:    Healthy female exam.   HTN hyperlipidemia Plan:    check labs mammo utd con't meds  See After Visit Summary for Counseling Recommendations

## 2010-12-02 NOTE — Patient Instructions (Signed)
Tips To Keep You From Falling  IN THE HOSPITAL, YOU MAY HAVE MORE RISK OF FALLING BECAUSE:  You are in a new place. Pathways are all different.   You may be taking medicine that might cause you to be dizzy or confused.   You may get weak and unsteady from being sick, or from your tests and treatments.  WHILE YOU ARE IN THE HOSPITAL, DO THESE THINGS TO HELP KEEP YOU FROM FALLING:  Keep the nurse call button where you can reach it all the time.   Call the nurse or patient care assistant (PCA) to help you get up if you feel dizzy or weak. They want to help you.   If you have any medical equipment connected to you, such as IVs, drains or tubes, be sure to call for help every time you need to get up, especially if you need to use the toilet This is important to prevent tugging on the equipment or tripping over it.   Keep your bed in the low position while you are resting or sleeping.   Keep your phone and personal items where you can reach them without leaning.  HOME CARE:  Change positions slowly.   Sit up on the side of your bed for a few minutes before standing. This prevents dizziness.   Keep a urinal or bedside commode by your bed at night.   Keep a light by your bed you can turn on before getting up. Turn lights on as you go, so you can see clearly.   If you wear glasses, put them on before getting up out of a chair or bed. Wear them while you are walking. Seeing clearly helps your balance and helps you notice things blocking your path.   Walk slowly.   Wear sturdy shoes with low heels and non-slip soles.   Stay close to sturdy furniture that you can hold on to if needed.   If you have a cane or walker, be sure to use it whenever you are walking.   Keep floors and stairways clear of clutter.   Get rid of rugs that slide or bunch up.   Use reflective tape on the edges of steps and to mark the top and bottom of staircases.   Put up grab bars by your tub, shower and toilet.    Get a higher toilet seat.   Use a shower seat with non-slip legs. Put a non-slip mat in your tub.   Avoid using step stools.   Keep things you usually use in an easy-to-reach place. Do this in the kitchen, bathroom and bedroom.   Keep a phone by your bed.   Keep an emergency response button with you all the time.  Easy-to-Read style based on content from [facility], [place], [place] Document Released: 04/09/2009 Document Re-Released: 04/10/2009 [employer] Patient Information 2011 [employer]. 

## 2010-12-21 ENCOUNTER — Other Ambulatory Visit: Payer: Self-pay | Admitting: Family Medicine

## 2010-12-21 ENCOUNTER — Other Ambulatory Visit: Payer: Self-pay

## 2010-12-21 DIAGNOSIS — E785 Hyperlipidemia, unspecified: Secondary | ICD-10-CM

## 2010-12-21 MED ORDER — PRAVASTATIN SODIUM 40 MG PO TABS: 40.0000 mg | ORAL_TABLET | Freq: Every day | ORAL | Status: DC

## 2010-12-21 NOTE — Telephone Encounter (Signed)
Duplicate Rx already sent today

## 2011-01-28 ENCOUNTER — Telehealth: Payer: Self-pay | Admitting: Family Medicine

## 2011-01-28 DIAGNOSIS — I10 Essential (primary) hypertension: Secondary | ICD-10-CM

## 2011-01-28 MED ORDER — VALSARTAN 160 MG PO TABS: 160.0000 mg | ORAL_TABLET | Freq: Every day | ORAL | Status: DC

## 2011-01-28 NOTE — Telephone Encounter (Signed)
Patient mail order changed - needs refill diovan - [facility]

## 2011-01-28 NOTE — Telephone Encounter (Signed)
Rx faxed to [phone]  to [facility]

## 2011-03-09 ENCOUNTER — Ambulatory Visit (INDEPENDENT_AMBULATORY_CARE_PROVIDER_SITE_OTHER): Payer: Medicare PPO | Admitting: Family Medicine

## 2011-03-09 ENCOUNTER — Encounter: Payer: Self-pay | Admitting: Family Medicine

## 2011-03-09 VITALS — BP 110/74 | HR 60 | Temp 98.5°F | Wt 155.2 lb

## 2011-03-09 DIAGNOSIS — R197 Diarrhea, unspecified: Secondary | ICD-10-CM

## 2011-03-09 DIAGNOSIS — J069 Acute upper respiratory infection, unspecified: Secondary | ICD-10-CM

## 2011-03-09 LAB — BASIC METABOLIC PANEL
BUN: 12 mg/dL (ref 6–23)
CO2: 28 mEq/L (ref 19–32)
Calcium: 10.2 mg/dL (ref 8.4–10.5)
Chloride: 106 mEq/L (ref 96–112)
Creatinine, Ser: 0.8 mg/dL (ref 0.4–1.2)
GFR: 72.64 mL/min (ref >60.00)
Glucose, Bld: 95 mg/dL (ref 70–99)
Potassium: 5.6 mEq/L — ABNORMAL HIGH (ref 3.5–5.1)
Sodium: 146 mEq/L — ABNORMAL HIGH (ref 135–145)

## 2011-03-09 LAB — HEPATIC FUNCTION PANEL
ALT: 22 U/L (ref 0–35)
AST: 25 U/L (ref 0–37)
Albumin: 4.3 g/dL (ref 3.5–5.2)
Alkaline Phosphatase: 56 U/L (ref 39–117)
Bilirubin, Direct: 0.1 mg/dL (ref 0.0–0.3)
Total Bilirubin: 0.6 mg/dL (ref 0.3–1.2)
Total Protein: 7.2 g/dL (ref 6.0–8.3)

## 2011-03-09 LAB — CBC WITH DIFFERENTIAL/PLATELET
Basophils Absolute: 0 10*3/uL (ref 0.0–0.1)
Basophils Relative: 0.3 % (ref 0.0–3.0)
Eosinophils Absolute: 0 10*3/uL (ref 0.0–0.7)
Eosinophils Relative: 0.5 % (ref 0.0–5.0)
HCT: 45.4 % (ref 36.0–46.0)
Hemoglobin: 15.2 g/dL — ABNORMAL HIGH (ref 12.0–15.0)
Lymphocytes Relative: 22.2 % (ref 12.0–46.0)
Lymphs Abs: 2 10*3/uL (ref 0.7–4.0)
MCHC: 33.5 g/dL (ref 30.0–36.0)
MCV: 92.2 fl (ref 78.0–100.0)
Monocytes Absolute: 0.7 10*3/uL (ref 0.1–1.0)
Monocytes Relative: 7.9 % (ref 3.0–12.0)
Neutro Abs: 6.1 10*3/uL (ref 1.4–7.7)
Neutrophils Relative %: 69.1 % (ref 43.0–77.0)
Platelets: 238 10*3/uL (ref 150.0–400.0)
RBC: 4.92 Mil/uL (ref 3.87–5.11)
RDW: 14.5 % (ref 11.5–14.6)
WBC: 8.8 10*3/uL (ref 4.5–10.5)

## 2011-03-09 LAB — TSH: TSH: 0.62 u[IU]/mL (ref 0.35–5.50)

## 2011-03-09 MED ORDER — BENZONATATE 100 MG PO CAPS: 100.0000 mg | ORAL_CAPSULE | Freq: Four times a day (QID) | ORAL | Status: AC | PRN

## 2011-03-09 MED ORDER — HYOSCYAMINE SULFATE 0.125 MG SL SUBL: 0.1250 mg | SUBLINGUAL_TABLET | SUBLINGUAL | Status: AC | PRN

## 2011-03-09 NOTE — Progress Notes (Signed)
  Subjective:     Jessica Mullins is a 77 y.o. female who presents for evaluation of diarrhea. Onset of diarrhea was 6 days ago. Diarrhea is occurring approximately 10 times per day. Patient describes diarrhea as watery. Diarrhea has been associated with abdominal pain described as cramping and suspicious food/drink -pt was traveling in [address]. Patient denies blood in stool, fever, illness in household contacts, recent antibiotic use, recent camping. Previous visits for diarrhea: none. Evaluation to date: none.  Treatment to date: otc antidiarrhea.---- It has slowed down some.  She has had 2 loose stools today.  The following portions of the patient's history were reviewed and updated as appropriate: allergies, current medications, past family history, past medical history, past social history, past surgical history and problem list.  Review of Systems Pertinent items are noted in HPI.    Objective:    BP 110/74  Pulse 60  Temp(Src) 98.5 F (36.9 C) (Oral)  Wt 155 lb 3.2 oz (70.398 kg)  SpO2 98% General: alert, cooperative, appears stated age and no distress  Hydration:  well hydrated  Abdomen:    soft, non-tender; bowel sounds normal; no masses,  no organomegaly    Assessment:    Diarrhea of uncertain etiology, mild in severity   Plan:    Appropriate educational material discussed and distributed. Clear liquids for a few days. Discussed the appropriate management of diarrhea. Follow up as needed. Lab studies per orders. Medications per orders.

## 2011-03-09 NOTE — Patient Instructions (Signed)
Diarrhea Diarrhea can be caused by many conditions. The most common cause is a virus.  Other causes include:  Food poisoning.   Bacterial infection.   Reactions to medicine (especially antibiotics and antacids).  Most of the time diarrhea improves after 2-3 days of rest and oral fluid replacement.  Drink enough clear fluids (water, sodas, Gatorade) to prevent dehydration. Adults must drink at least 2-3 quarts daily. Solid foods and dairy products should be avoided until you improve. Then begin with bland foods such as bananas, rice, applesauce, dry toast, crackers or other starches. Avoid spicy or fatty foods, caffeine and alcohol for several days. Medicine to control cramping and diarrhea may be helpful. If you have a fever or blood in the stool, avoid these medicines. They may prolong your illness. Antibiotics can speed recovery from diarrhea due to some bacterial infections, but may cause complications.  SEEK MEDICAL CARE IF:  Your diarrhea does not get better in 3 days.   You have fever.   You have blood in the stool.   You develop vomiting.   You become more dehydrated.  Document Released: 06/13/2005 Document Re-Released: 12/25/2006 [employer] Patient Information 2011 [employer]. 

## 2011-03-22 ENCOUNTER — Encounter: Payer: Self-pay | Admitting: Family Medicine

## 2011-06-13 ENCOUNTER — Ambulatory Visit (INDEPENDENT_AMBULATORY_CARE_PROVIDER_SITE_OTHER): Payer: Medicare PPO | Admitting: Family Medicine

## 2011-06-13 ENCOUNTER — Encounter: Payer: Self-pay | Admitting: Family Medicine

## 2011-06-13 VITALS — BP 116/70 | HR 54 | Temp 97.8°F | Wt 161.0 lb

## 2011-06-13 DIAGNOSIS — E785 Hyperlipidemia, unspecified: Secondary | ICD-10-CM

## 2011-06-13 DIAGNOSIS — I10 Essential (primary) hypertension: Secondary | ICD-10-CM

## 2011-06-13 LAB — LIPID PANEL
Cholesterol: 155 mg/dL (ref 0–200)
HDL: 68.7 mg/dL (ref >39.00)
LDL Cholesterol: 76 mg/dL (ref 0–99)
Total CHOL/HDL Ratio: 2
Triglycerides: 54 mg/dL (ref 0.0–149.0)
VLDL: 10.8 mg/dL (ref 0.0–40.0)

## 2011-06-13 LAB — BASIC METABOLIC PANEL
BUN: 20 mg/dL (ref 6–23)
CO2: 28 mEq/L (ref 19–32)
Calcium: 9.5 mg/dL (ref 8.4–10.5)
Chloride: 107 mEq/L (ref 96–112)
Creatinine, Ser: 0.7 mg/dL (ref 0.4–1.2)
GFR: 80.57 mL/min (ref >60.00)
Glucose, Bld: 90 mg/dL (ref 70–99)
Potassium: 4.3 mEq/L (ref 3.5–5.1)
Sodium: 144 mEq/L (ref 135–145)

## 2011-06-13 LAB — HEPATIC FUNCTION PANEL
ALT: 21 U/L (ref 0–35)
AST: 23 U/L (ref 0–37)
Albumin: 4.1 g/dL (ref 3.5–5.2)
Alkaline Phosphatase: 57 U/L (ref 39–117)
Bilirubin, Direct: 0.1 mg/dL (ref 0.0–0.3)
Total Bilirubin: 0.6 mg/dL (ref 0.3–1.2)
Total Protein: 6.7 g/dL (ref 6.0–8.3)

## 2011-06-13 MED ORDER — AMLODIPINE BESYLATE 2.5 MG PO TABS: 2.5000 mg | ORAL_TABLET | Freq: Every day | ORAL | Status: DC

## 2011-06-13 NOTE — Progress Notes (Signed)
  Subjective:    Patient here for follow-up of elevated blood pressure.  She is not exercising and is adherent to a low-salt diet.  Blood pressure is well controlled at home. Cardiac symptoms: none. Patient denies: chest pain, chest pressure/discomfort, claudication, dyspnea, exertional chest pressure/discomfort, fatigue, irregular heart beat, lower extremity edema, near-syncope, orthopnea, palpitations, paroxysmal nocturnal dyspnea, syncope and tachypnea. Cardiovascular risk factors: advanced age (older than 43 for men, 66 for women), dyslipidemia, hypertension and sedentary lifestyle. Use of agents associated with hypertension: none. History of target organ damage: none.  The following portions of the patient's history were reviewed and updated as appropriate: allergies, current medications, past family history, past medical history, past social history, past surgical history and problem list.  Review of Systems Pertinent items are noted in HPI.     Objective:    BP 116/70  Pulse 54  Temp(Src) 97.8 F (36.6 C) (Oral)  Wt 161 lb (73.029 kg)  SpO2 97% General appearance: alert, cooperative, appears stated age and no distress Neck: no adenopathy, no carotid bruit, no JVD, supple, symmetrical, trachea midline and thyroid not enlarged, symmetric, no tenderness/mass/nodules Lungs: clear to auscultation bilaterally Heart: regular rate and rhythm, S1, S2 normal, no murmur, click, rub or gallop Extremities: extremities normal, atraumatic, no cyanosis or edema    Assessment:    Hypertension, normal blood pressure . Evidence of target organ damage: none.    Plan:    Medication: no change. Dietary sodium restriction. Regular aerobic exercise. Check blood pressures 2-3 times weekly and record. Follow up: 6 months and as needed.

## 2011-06-13 NOTE — Patient Instructions (Signed)
Try an otc antihistamine ----zyrtec, claritin or allegra--- for several weeks.  If cough does not resolve---try prevacid or prilosec otc for several weeks.  Please call and let us know which one works or if it does not work at all.

## 2011-06-20 ENCOUNTER — Encounter: Payer: Self-pay | Admitting: Family Medicine

## 2011-09-18 ENCOUNTER — Emergency Department (HOSPITAL_BASED_OUTPATIENT_CLINIC_OR_DEPARTMENT_OTHER)
Admission: EM | Admit: 2011-09-18 | Discharge: 2011-09-18 | Disposition: A | Discharge status: Home / Self Care | Payer: Medicare PPO

## 2011-09-29 ENCOUNTER — Ambulatory Visit (INDEPENDENT_AMBULATORY_CARE_PROVIDER_SITE_OTHER): Payer: Medicare PPO | Admitting: Family Medicine

## 2011-09-29 ENCOUNTER — Encounter: Payer: Self-pay | Admitting: Family Medicine

## 2011-09-29 VITALS — BP 122/80 | HR 67 | Temp 98.6°F | Wt 167.8 lb

## 2011-09-29 DIAGNOSIS — J309 Allergic rhinitis, unspecified: Secondary | ICD-10-CM

## 2011-09-29 DIAGNOSIS — R05 Cough: Secondary | ICD-10-CM

## 2011-09-29 DIAGNOSIS — J302 Other seasonal allergic rhinitis: Secondary | ICD-10-CM

## 2011-09-29 DIAGNOSIS — R059 Cough, unspecified: Secondary | ICD-10-CM

## 2011-09-29 MED ORDER — LORATADINE 10 MG PO TABS: ORAL_TABLET | ORAL | Status: DC

## 2011-09-29 NOTE — Progress Notes (Signed)
  Subjective:     Chakara Flom is a 78 y.o. female here for evaluation of a cough. Onset of symptoms was 1 year ago. Symptoms have been unchanged since that time. The cough is dry and is aggravated by reclining position. Associated symptoms include: heartburn and postnasal drip. Patient does not have a history of asthma. Patient does have a history of environmental allergens. Patient has not traveled recently. Patient does not have a history of smoking  But + second hand smoke.. Patient has not had a previous chest x-ray. Patient has not had a PPD done.  The following portions of the patient's history were reviewed and updated as appropriate: allergies, current medications, past family history, past medical history, past social history, past surgical history and problem list.  Review of Systems Pertinent items are noted in HPI.    Objective:    Oxygen saturation 98% on room air BP 122/80  Pulse 67  Temp(Src) 98.6 F (37 C) (Oral)  Wt 167 lb 12.8 oz (76.114 kg)  SpO2 98% General appearance: alert, cooperative and appears stated age Head: ncat Eyes: perla, eomi Ears: tmi b/l Nose: turb normal, no congestion Throat: + pnd  No errythema Neck: supple, no adenopathy Lungs: ctabl Heart: +S1S2   Assessment:    persistant cough   Plan:    con't loratidine prilosec daily cxr

## 2011-09-29 NOTE — Patient Instructions (Signed)

## 2011-12-13 ENCOUNTER — Ambulatory Visit (INDEPENDENT_AMBULATORY_CARE_PROVIDER_SITE_OTHER): Payer: Medicare PPO | Admitting: Family Medicine

## 2011-12-13 ENCOUNTER — Encounter: Payer: Self-pay | Admitting: Family Medicine

## 2011-12-13 VITALS — BP 118/80 | HR 61 | Temp 98.3°F | Wt 163.8 lb

## 2011-12-13 DIAGNOSIS — M79609 Pain in unspecified limb: Secondary | ICD-10-CM

## 2011-12-13 DIAGNOSIS — I10 Essential (primary) hypertension: Secondary | ICD-10-CM

## 2011-12-13 DIAGNOSIS — M79672 Pain in left foot: Secondary | ICD-10-CM

## 2011-12-13 DIAGNOSIS — M79671 Pain in right foot: Secondary | ICD-10-CM

## 2011-12-13 DIAGNOSIS — E785 Hyperlipidemia, unspecified: Secondary | ICD-10-CM

## 2011-12-13 LAB — HEPATIC FUNCTION PANEL
ALT: 20 U/L (ref 0–35)
AST: 20 U/L (ref 0–37)
Albumin: 4 g/dL (ref 3.5–5.2)
Alkaline Phosphatase: 54 U/L (ref 39–117)
Bilirubin, Direct: 0.1 mg/dL (ref 0.0–0.3)
Total Bilirubin: 0.6 mg/dL (ref 0.3–1.2)
Total Protein: 6.6 g/dL (ref 6.0–8.3)

## 2011-12-13 LAB — BASIC METABOLIC PANEL
BUN: 16 mg/dL (ref 6–23)
CO2: 30 mEq/L (ref 19–32)
Calcium: 9.3 mg/dL (ref 8.4–10.5)
Chloride: 108 mEq/L (ref 96–112)
Creatinine, Ser: 0.8 mg/dL (ref 0.4–1.2)
GFR: 76.86 mL/min (ref >60.00)
Glucose, Bld: 97 mg/dL (ref 70–99)
Potassium: 4.3 mEq/L (ref 3.5–5.1)
Sodium: 145 mEq/L (ref 135–145)

## 2011-12-13 LAB — LIPID PANEL
Cholesterol: 140 mg/dL (ref 0–200)
HDL: 55.3 mg/dL (ref >39.00)
LDL Cholesterol: 64 mg/dL (ref 0–99)
Total CHOL/HDL Ratio: 3
Triglycerides: 104 mg/dL (ref 0.0–149.0)
VLDL: 20.8 mg/dL (ref 0.0–40.0)

## 2011-12-13 MED ORDER — PRAVASTATIN SODIUM 40 MG PO TABS: 40.0000 mg | ORAL_TABLET | Freq: Every day | ORAL | Status: DC

## 2011-12-13 MED ORDER — MELOXICAM 15 MG PO TABS: ORAL_TABLET | ORAL | Status: DC

## 2011-12-13 NOTE — Patient Instructions (Addendum)

## 2011-12-13 NOTE — Progress Notes (Signed)
  Subjective:    Patient here for follow-up of elevated blood pressure.  She is not exercising and is adherent to a low-salt diet.  Blood pressure is well controlled at home. Cardiac symptoms: none. Patient denies: chest pain, chest pressure/discomfort, claudication, dyspnea, exertional chest pressure/discomfort, fatigue, irregular heart beat, lower extremity edema, near-syncope, orthopnea, palpitations, paroxysmal nocturnal dyspnea, syncope and tachypnea. Cardiovascular risk factors: advanced age (older than 52 for men, 66 for women), dyslipidemia, hypertension and sedentary lifestyle. Use of agents associated with hypertension: none. History of target organ damage: none.  Pt c/o pain in both feet---+ bunions--she would like to see specialist She also has pain in R shoulder but she thinks this is from computer and will hold off on doing anything.  The following portions of the patient's history were reviewed and updated as appropriate: allergies, current medications, past family history, past medical history, past social history, past surgical history and problem list.  Review of Systems Pertinent items are noted in HPI.     Objective:    BP 118/80  Pulse 61  Temp 98.3 F (36.8 C) (Oral)  Wt 163 lb 12.8 oz (74.299 kg)  SpO2 98% General appearance: alert, cooperative, appears stated age and no distress Lungs: clear to auscultation bilaterally Heart: S1, S2 normal Extremities: no edema, redness or tenderness in the calves or thighs                      Pain in R shoulder--no dec rom,  Dull pain  Assessment:    Hypertension, normal blood pressure . Evidence of target organ damage: none.   b/l feet pain-- refer to ortho R shoulder pain-- pt will wait on this Plan:    Medication: no change. Dietary sodium restriction. Regular aerobic exercise. Check blood pressures 2-3 times weekly and record. Follow up: 6 months and as needed.

## 2012-02-20 ENCOUNTER — Other Ambulatory Visit: Payer: Self-pay | Admitting: Family Medicine

## 2012-06-13 ENCOUNTER — Encounter: Payer: Self-pay | Admitting: Family Medicine

## 2012-06-13 ENCOUNTER — Ambulatory Visit (INDEPENDENT_AMBULATORY_CARE_PROVIDER_SITE_OTHER): Payer: Medicare PPO | Admitting: Family Medicine

## 2012-06-13 VITALS — BP 118/70 | HR 73 | Temp 98.3°F | Wt 167.0 lb

## 2012-06-13 DIAGNOSIS — M79672 Pain in left foot: Secondary | ICD-10-CM

## 2012-06-13 DIAGNOSIS — M79602 Pain in left arm: Secondary | ICD-10-CM | POA: Insufficient documentation

## 2012-06-13 DIAGNOSIS — M79609 Pain in unspecified limb: Secondary | ICD-10-CM

## 2012-06-13 DIAGNOSIS — R1013 Epigastric pain: Secondary | ICD-10-CM

## 2012-06-13 DIAGNOSIS — K3189 Other diseases of stomach and duodenum: Secondary | ICD-10-CM

## 2012-06-13 DIAGNOSIS — E785 Hyperlipidemia, unspecified: Secondary | ICD-10-CM

## 2012-06-13 LAB — HEPATIC FUNCTION PANEL
ALT: 23 U/L (ref 0–35)
AST: 22 U/L (ref 0–37)
Albumin: 4.1 g/dL (ref 3.5–5.2)
Alkaline Phosphatase: 62 U/L (ref 39–117)
Bilirubin, Direct: 0.1 mg/dL (ref 0.0–0.3)
Total Bilirubin: 0.6 mg/dL (ref 0.3–1.2)
Total Protein: 7.1 g/dL (ref 6.0–8.3)

## 2012-06-13 LAB — BASIC METABOLIC PANEL
BUN: 16 mg/dL (ref 6–23)
CO2: 28 mEq/L (ref 19–32)
Calcium: 9.7 mg/dL (ref 8.4–10.5)
Chloride: 105 mEq/L (ref 96–112)
Creatinine, Ser: 0.8 mg/dL (ref 0.4–1.2)
GFR: 73.44 mL/min (ref >60.00)
Glucose, Bld: 95 mg/dL (ref 70–99)
Potassium: 4.2 mEq/L (ref 3.5–5.1)
Sodium: 140 mEq/L (ref 135–145)

## 2012-06-13 LAB — LIPID PANEL
Cholesterol: 154 mg/dL (ref 0–200)
HDL: 62.6 mg/dL (ref >39.00)
LDL Cholesterol: 71 mg/dL (ref 0–99)
Total CHOL/HDL Ratio: 2
Triglycerides: 104 mg/dL (ref 0.0–149.0)
VLDL: 20.8 mg/dL (ref 0.0–40.0)

## 2012-06-13 MED ORDER — MELOXICAM 15 MG PO TABS: ORAL_TABLET | ORAL | Status: DC

## 2012-06-13 MED ORDER — OMEPRAZOLE 20 MG PO CPDR: 20.0000 mg | DELAYED_RELEASE_CAPSULE | Freq: Every day | ORAL | Status: DC

## 2012-06-13 NOTE — Progress Notes (Signed)
  Subjective:    Patient here for follow-up of elevated blood pressure.  She is not exercising and is adherent to a low-salt diet.  Blood pressure is well controlled at home. Cardiac symptoms: none. Patient denies: chest pain, chest pressure/discomfort, claudication, dyspnea, exertional chest pressure/discomfort, fatigue, irregular heart beat, lower extremity edema, near-syncope, orthopnea, palpitations, paroxysmal nocturnal dyspnea, syncope and tachypnea. Cardiovascular risk factors: none. Use of agents associated with hypertension: none. History of target organ damage: none.  She is also here for cholesterol check.  She is c/o L upper arm pain x 3 weeks--- no known injury.  No cp, no palpitations.  Upper arm pops on occasion.    The following portions of the patient's history were reviewed and updated as appropriate: allergies, current medications, past family history, past medical history, past social history, past surgical history and problem list.  Review of Systems Pertinent items are noted in HPI.     Objective:    BP 118/70  Pulse 73  Temp 98.3 F (36.8 C) (Oral)  Wt 167 lb (75.751 kg)  SpO2 97% General appearance: alert, cooperative, appears stated age and no distress Lungs: clear to auscultation bilaterally Heart: S1, S2 normal Extremities: extremities normal, atraumatic, no cyanosis or edema Neurologic: Mental status: Alert, oriented, thought content appropriate Motor: pain with rotation L arm,  pain L deltoid with palp    Assessment:    Hypertension, normal blood pressure . Evidence of target organ damage: none.   dyspepsia--prilosec daily---  Consider GI if no relief Hyperlipidemia--check labs  Plan:    Medication: no change. Dietary sodium restriction. Regular aerobic exercise. Check blood pressures 2-3 times weekly and record. Follow up: 6 months and as needed.

## 2012-06-13 NOTE — Patient Instructions (Addendum)

## 2012-06-13 NOTE — Assessment & Plan Note (Signed)
Ice alt heat Pt prefers no sling mobic daily for 1-2 weeks Ortho if no better

## 2012-06-18 ENCOUNTER — Encounter: Payer: Self-pay | Admitting: *Deleted

## 2012-06-28 ENCOUNTER — Other Ambulatory Visit: Payer: Self-pay | Admitting: Family Medicine

## 2012-08-16 ENCOUNTER — Other Ambulatory Visit: Payer: Self-pay | Admitting: *Deleted

## 2012-08-16 DIAGNOSIS — R1013 Epigastric pain: Secondary | ICD-10-CM

## 2012-08-16 MED ORDER — OMEPRAZOLE 20 MG PO CPDR: 20.0000 mg | DELAYED_RELEASE_CAPSULE | Freq: Every day | ORAL | Status: DC

## 2012-08-16 NOTE — Telephone Encounter (Signed)
Pharmacy called stating that Pt needs Rx changed to a 90 day supply. Rx sent.

## 2012-09-25 ENCOUNTER — Encounter: Payer: Self-pay | Admitting: Family Medicine

## 2012-09-25 ENCOUNTER — Other Ambulatory Visit: Payer: Self-pay | Admitting: Family Medicine

## 2012-09-25 ENCOUNTER — Ambulatory Visit (INDEPENDENT_AMBULATORY_CARE_PROVIDER_SITE_OTHER): Payer: Medicare PPO | Admitting: Family Medicine

## 2012-09-25 VITALS — BP 122/76 | HR 56 | Temp 98.4°F | Ht 61.25 in | Wt 171.8 lb

## 2012-09-25 DIAGNOSIS — Z0000 Encounter for general adult medical examination without abnormal findings: Secondary | ICD-10-CM

## 2012-09-25 DIAGNOSIS — I10 Essential (primary) hypertension: Secondary | ICD-10-CM

## 2012-09-25 DIAGNOSIS — E785 Hyperlipidemia, unspecified: Secondary | ICD-10-CM

## 2012-09-25 DIAGNOSIS — M949 Disorder of cartilage, unspecified: Secondary | ICD-10-CM

## 2012-09-25 DIAGNOSIS — C439 Malignant melanoma of skin, unspecified: Secondary | ICD-10-CM

## 2012-09-25 DIAGNOSIS — M899 Disorder of bone, unspecified: Secondary | ICD-10-CM

## 2012-09-25 DIAGNOSIS — M8580 Other specified disorders of bone density and structure, unspecified site: Secondary | ICD-10-CM

## 2012-09-25 LAB — BASIC METABOLIC PANEL
BUN: 19 mg/dL (ref 6–23)
CO2: 28 mEq/L (ref 19–32)
Calcium: 9.2 mg/dL (ref 8.4–10.5)
Chloride: 104 mEq/L (ref 96–112)
Creatinine, Ser: 0.8 mg/dL (ref 0.4–1.2)
GFR: 75.57 mL/min (ref >60.00)
Glucose, Bld: 91 mg/dL (ref 70–99)
Potassium: 3.9 mEq/L (ref 3.5–5.1)
Sodium: 140 mEq/L (ref 135–145)

## 2012-09-25 LAB — CBC WITH DIFFERENTIAL/PLATELET
Basophils Absolute: 0 10*3/uL (ref 0.0–0.1)
Basophils Relative: 0.4 % (ref 0.0–3.0)
Eosinophils Absolute: 0.1 10*3/uL (ref 0.0–0.7)
Eosinophils Relative: 0.8 % (ref 0.0–5.0)
HCT: 43.2 % (ref 36.0–46.0)
Hemoglobin: 14.9 g/dL (ref 12.0–15.0)
Lymphocytes Relative: 25.9 % (ref 12.0–46.0)
Lymphs Abs: 1.8 10*3/uL (ref 0.7–4.0)
MCHC: 34.3 g/dL (ref 30.0–36.0)
MCV: 90.3 fl (ref 78.0–100.0)
Monocytes Absolute: 0.4 10*3/uL (ref 0.1–1.0)
Monocytes Relative: 6.2 % (ref 3.0–12.0)
Neutro Abs: 4.7 10*3/uL (ref 1.4–7.7)
Neutrophils Relative %: 66.7 % (ref 43.0–77.0)
Platelets: 235 10*3/uL (ref 150.0–400.0)
RBC: 4.79 Mil/uL (ref 3.87–5.11)
RDW: 14.1 % (ref 11.5–14.6)
WBC: 7.1 10*3/uL (ref 4.5–10.5)

## 2012-09-25 LAB — LIPID PANEL
Cholesterol: 151 mg/dL (ref 0–200)
HDL: 57.3 mg/dL (ref >39.00)
LDL Cholesterol: 70 mg/dL (ref 0–99)
Total CHOL/HDL Ratio: 3
Triglycerides: 119 mg/dL (ref 0.0–149.0)
VLDL: 23.8 mg/dL (ref 0.0–40.0)

## 2012-09-25 LAB — HEPATIC FUNCTION PANEL
ALT: 26 U/L (ref 0–35)
AST: 23 U/L (ref 0–37)
Albumin: 4 g/dL (ref 3.5–5.2)
Alkaline Phosphatase: 60 U/L (ref 39–117)
Bilirubin, Direct: 0.1 mg/dL (ref 0.0–0.3)
Total Bilirubin: 0.7 mg/dL (ref 0.3–1.2)
Total Protein: 6.9 g/dL (ref 6.0–8.3)

## 2012-09-25 NOTE — Assessment & Plan Note (Signed)
Stable con't meds 

## 2012-09-25 NOTE — Progress Notes (Signed)
Subjective:    Jessica Mullins is a 79 y.o. female who presents for Medicare Annual/Subsequent preventive examination.  Preventive Screening-Counseling & Management  Tobacco History  Smoking status  . Never Smoker   Smokeless tobacco  . Never Used     Problems Prior to Visit 1.   Current Problems (verified) Patient Active Problem List  Diagnosis  . MELANOMA  . HYPERLIPIDEMIA  . HYPERTENSION  . TMJ PAIN  . UTI  . BREAST CYST  . SEBORRHEIC KERATOSIS  . SEBACEOUS CYST  . HIP PAIN, RIGHT  . OSTEOPENIA  . ANKLE SPRAIN  . COLONOSCOPY, HX OF  . SINUSITIS - ACUTE-NOS  . Left arm pain    Medications Prior to Visit Current Outpatient Prescriptions on File Prior to Visit  Medication Sig Dispense Refill  . amLODipine (NORVASC) 2.5 MG tablet TAKE 1 TABLET BY MOUTH DAILY  90 tablet  1  . Calcium Carbonate-Vitamin D (CALTRATE 600+D) 600-400 MG-UNIT per tablet Take 1 tablet by mouth daily.        . Cholecalciferol (VITAMIN D3) 1000 UNITS CAPS Take 1 capsule by mouth daily.        [name] DIOVAN 160 MG tablet TAKE 1 TABLET BY MOUTH DAILY  90 tablet  1  . loratadine (CLARITIN) 10 MG tablet 1 po qd prn      . meloxicam (MOBIC) 15 MG tablet 1/2 -1 po qd prn pain  30 tablet  2  . Multiple Vitamins-Minerals (SENIOR MULTIVITAMIN PLUS) TABS Take by mouth.        [name] omeprazole (PRILOSEC) 20 MG capsule Take 1 capsule (20 mg total) by mouth daily.  90 capsule  0  . pravastatin (PRAVACHOL) 40 MG tablet Take 1 tablet (40 mg total) by mouth at bedtime.  90 tablet  3   No current facility-administered medications on file prior to visit.    Current Medications (verified) Current Outpatient Prescriptions  Medication Sig Dispense Refill  . amLODipine (NORVASC) 2.5 MG tablet TAKE 1 TABLET BY MOUTH DAILY  90 tablet  1  . Calcium Carbonate-Vitamin D (CALTRATE 600+D) 600-400 MG-UNIT per tablet Take 1 tablet by mouth daily.        . Cholecalciferol (VITAMIN D3) 1000 UNITS CAPS Take 1 capsule by mouth  daily.        [name] DIOVAN 160 MG tablet TAKE 1 TABLET BY MOUTH DAILY  90 tablet  1  . loratadine (CLARITIN) 10 MG tablet 1 po qd prn      . meloxicam (MOBIC) 15 MG tablet 1/2 -1 po qd prn pain  30 tablet  2  . Multiple Vitamins-Minerals (SENIOR MULTIVITAMIN PLUS) TABS Take by mouth.        [name] omeprazole (PRILOSEC) 20 MG capsule Take 1 capsule (20 mg total) by mouth daily.  90 capsule  0  . pravastatin (PRAVACHOL) 40 MG tablet Take 1 tablet (40 mg total) by mouth at bedtime.  90 tablet  3   No current facility-administered medications for this visit.     Allergies (verified) Sulfa antibiotics   PAST HISTORY  Family History Family History  Problem Relation Age of Onset  . Lung cancer    . Thrombosis      thromboembolism clotting disorder--? father died of ? clot   . Cancer Mother     mets to bone--? primary  . Coronary artery disease Brother     died of M! @ 37  . Diabetes Brother   . Hyperlipidemia Brother   . Hypertension Brother   .  Heart disease Brother     chf  . Heart disease Father 24    MI  . Cancer Sister 67    lung  . Diabetes Maternal Aunt     Social History History  Substance Use Topics  . Smoking status: Never Smoker   . Smokeless tobacco: Never Used  . Alcohol Use: Yes     Are there smokers in your home (other than you)? No  Risk Factors Current exercise habits: The patient does not participate in regular exercise at present.  Dietary issues discussed: na   Cardiac risk factors: advanced age (older than 26 for men, 31 for women), dyslipidemia, hypertension, obesity (BMI >= 30 kg/m2) and sedentary lifestyle.  Depression Screen (Note: if answer to either of the following is "Yes", a more complete depression screening is indicated)   Over the past two weeks, have you felt down, depressed or hopeless? No  Over the past two weeks, have you felt little interest or pleasure in doing things? No  Have you lost interest or pleasure in daily life? No  Do you  often feel hopeless? No  Do you cry easily over simple problems? No  Activities of Daily Living In your present state of health, do you have any difficulty performing the following activities?:  Driving? No Managing money?  No Feeding yourself? No Getting from bed to chair? No Climbing a flight of stairs? No Preparing food and eating?: No Bathing or showering? No Getting dressed: No Getting to the toilet? No Using the toilet:No Moving around from place to place: No In the past year have you fallen or had a near fall?:No   Are you sexually active?  No  Do you have more than one partner?  No  Hearing Difficulties: No Do you often ask people to speak up or repeat themselves? No Do you experience ringing or noises in your ears? No Do you have difficulty understanding soft or whispered voices? No   Do you feel that you have a problem with memory? No  Do you often misplace items? No  Do you feel safe at home?  Yes  Cognitive Testing  Alert? Yes  Normal Appearance?Yes  Oriented to person? Yes  Place? Yes   Time? Yes  Recall of three objects?  Yes  Can perform simple calculations? Yes  Displays appropriate judgment?Yes  Can read the correct time from a watch face?Yes   Advanced Directives have been discussed with the patient? Yes  List the Names of Other Physician/Practitioners you currently use: 1.  [facility]th- [facility] 2. Dentist- Maurice March 3 Derm-- [facility] any recent Medical Services you may have received from other than [facility] providers in the past year (date may be approximate).  Immunization History  Administered Date(s) Administered  . H1N1 07/01/2008  . Influenza Whole 04/10/2007, 04/01/2009, 03/26/2010, 03/25/2012  . Td 11/05/2003  . Zoster 02/24/2010    Screening Tests Health Maintenance  Topic Date Due  . Influenza Vaccine  02/25/2013  . Tetanus/tdap  11/04/2013  . Colonoscopy  01/13/2020  . Pneumococcal Polysaccharide Vaccine Age 66 And Over  Addressed   . Zostavax  Completed    All answers were reviewed with the patient and necessary referrals were made:  Loreen Freud, DO   09/25/2012   History reviewed:  She  has a past medical history of Hypertension and Hyperlipidemia. She  does not have any pertinent problems on file. She  has past surgical history that includes Abdominal hysterectomy; Appendectomy; Ganglion cyst excision;  and Breast lumpectomy. Her family history includes Cancer in her mother; Cancer (age of onset: 77) in her sister; Coronary artery disease in her brother; Diabetes in her brother and maternal aunt; Heart disease in her brother; Heart disease (age of onset: 26) in her father; Hyperlipidemia in her brother; Hypertension in her brother; Lung cancer in an unspecified family member; and Thrombosis in an unspecified family member. She  reports that she has never smoked. She has never used smokeless tobacco. She reports that  drinks alcohol. She reports that she does not use illicit drugs. She has a current medication list which includes the following prescription(s): amlodipine, calcium carbonate-vitamin d, vitamin d3, diovan, loratadine, meloxicam, senior multivitamin plus, omeprazole, and pravastatin. Current Outpatient Prescriptions on File Prior to Visit  Medication Sig Dispense Refill  . amLODipine (NORVASC) 2.5 MG tablet TAKE 1 TABLET BY MOUTH DAILY  90 tablet  1  . Calcium Carbonate-Vitamin D (CALTRATE 600+D) 600-400 MG-UNIT per tablet Take 1 tablet by mouth daily.        . Cholecalciferol (VITAMIN D3) 1000 UNITS CAPS Take 1 capsule by mouth daily.        [name] DIOVAN 160 MG tablet TAKE 1 TABLET BY MOUTH DAILY  90 tablet  1  . loratadine (CLARITIN) 10 MG tablet 1 po qd prn      . meloxicam (MOBIC) 15 MG tablet 1/2 -1 po qd prn pain  30 tablet  2  . Multiple Vitamins-Minerals (SENIOR MULTIVITAMIN PLUS) TABS Take by mouth.        [name] omeprazole (PRILOSEC) 20 MG capsule Take 1 capsule (20 mg total) by mouth daily.  90 capsule  0   . pravastatin (PRAVACHOL) 40 MG tablet Take 1 tablet (40 mg total) by mouth at bedtime.  90 tablet  3   No current facility-administered medications on file prior to visit.   She is allergic to sulfa antibiotics.  Review of Systems  Review of Systems  Constitutional: Negative for activity change, appetite change and fatigue.  HENT: Negative for hearing loss, congestion, tinnitus and ear discharge.   Eyes: Negative for visual disturbance (see optho q1y -- vision corrected to 20/20 with glasses).  Respiratory: Negative for cough, chest tightness and shortness of breath.   Cardiovascular: Negative for chest pain, palpitations and leg swelling.  Gastrointestinal: Negative for abdominal pain, diarrhea, constipation and abdominal distention.  Genitourinary: Negative for urgency, frequency, decreased urine volume and difficulty urinating.  Musculoskeletal: Negative for back pain, arthralgias and gait problem.  Skin: Negative for color change, pallor and rash.  Neurological: Negative for dizziness, light-headedness, numbness and headaches.  Hematological: Negative for adenopathy. Does not bruise/bleed easily.  Psychiatric/Behavioral: Negative for suicidal ideas, confusion, sleep disturbance, self-injury, dysphoric mood, decreased concentration and agitation.  Pt is able to read and write and can do all ADLs No risk for falling No abuse/ violence in home     Objective:     Vision by Snellen chart: opth  Body mass index is 32.19 kg/(m^2). BP 122/76  Pulse 56  Temp(Src) 98.4 F (36.9 C) (Oral)  Ht 5' 1.25" (1.556 m)  Wt 171 lb 12.8 oz (77.928 kg)  BMI 32.19 kg/m2  SpO2 98%  BP 122/76  Pulse 56  Temp(Src) 98.4 F (36.9 C) (Oral)  Ht 5' 1.25" (1.556 m)  Wt 171 lb 12.8 oz (77.928 kg)  BMI 32.19 kg/m2  SpO2 98% General appearance: alert, cooperative, appears stated age and no distress Head: Normocephalic, without obvious abnormality, atraumatic Eyes: conjunctivae/corneas clear.  PERRL, EOM's intact. Fundi benign. Ears: normal TM's and external ear canals both ears Nose: Nares normal. Septum midline. Mucosa normal. No drainage or sinus tenderness. Throat: lips, mucosa, and tongue normal; teeth and gums normal Neck: no adenopathy, no carotid bruit, no JVD, supple, symmetrical, trachea midline and thyroid not enlarged, symmetric, no tenderness/mass/nodules Back: symmetric, no curvature. ROM normal. No CVA tenderness. Lungs: clear to auscultation bilaterally Breasts: normal appearance, no masses or tenderness Heart: regular rate and rhythm, S1, S2 normal, no murmur, click, rub or gallop Abdomen: soft, non-tender; bowel sounds normal; no masses,  no organomegaly Pelvic: not indicated; post-menopausal, no abnormal Pap smears in past Extremities: extremities normal, atraumatic, no cyanosis or edema Pulses: 2+ and symmetric Skin: Skin color, texture, turgor normal. No rashes or lesions Lymph nodes: Cervical, supraclavicular, and axillary nodes normal. Neurologic: Alert and oriented X 3, normal strength and tone. Normal symmetric reflexes. Normal coordination and gait Psych-- no depression, no anxiety      Assessment:     cpe     Plan:     During the course of the visit the patient was educated and counseled about appropriate screening and preventive services including:    Pneumococcal vaccine   Influenza vaccine  Screening mammography  Screening Pap smear and pelvic exam   Bone densitometry screening  Colorectal cancer screening  Diabetes screening  Glaucoma screening  Advanced directives: has an advanced directive - a copy HAS NOT been provided.  Diet review for nutrition referral? Yes ____  Not Indicated _x___   Patient Instructions (the written plan) was given to the patient.  Medicare Attestation I have personally reviewed: The patient's medical and social history Their use of alcohol, tobacco or illicit drugs Their current medications  and supplements The patient's functional ability including ADLs,fall risks, home safety risks, cognitive, and hearing and visual impairment Diet and physical activities Evidence for depression or mood disorders  The patient's weight, height, BMI, and visual acuity have been recorded in the chart.  I have made referrals, counseling, and provided education to the patient based on review of the above and I have provided the patient with a written personalized care plan for preventive services.     Loreen Freud, DO   09/25/2012

## 2012-09-25 NOTE — Assessment & Plan Note (Signed)
Check bmd 

## 2012-09-25 NOTE — Assessment & Plan Note (Signed)
Pt goes to derm regularly

## 2012-09-25 NOTE — Assessment & Plan Note (Signed)
Check labs con't meds 

## 2012-09-25 NOTE — Patient Instructions (Addendum)
Preventive Care for Adults, Female A healthy lifestyle and preventive care can promote health and wellness. Preventive health guidelines for women include the following key practices.  A routine yearly physical is a good way to check with your caregiver about your health and preventive screening. It is a chance to share any concerns and updates on your health, and to receive a thorough exam.  Visit your dentist for a routine exam and preventive care every 6 months. Brush your teeth twice a day and floss once a day. Good oral hygiene prevents tooth decay and gum disease.  The frequency of eye exams is based on your age, health, family medical history, use of contact lenses, and other factors. Follow your caregiver's recommendations for frequency of eye exams.  Eat a healthy diet. Foods like vegetables, fruits, whole grains, low-fat dairy products, and lean protein foods contain the nutrients you need without too many calories. Decrease your intake of foods high in solid fats, added sugars, and salt. Eat the right amount of calories for you.Get information about a proper diet from your caregiver, if necessary.  Regular physical exercise is one of the most important things you can do for your health. Most adults should get at least 150 minutes of moderate-intensity exercise (any activity that increases your heart rate and causes you to sweat) each week. In addition, most adults need muscle-strengthening exercises on 2 or more days a week.  Maintain a healthy weight. The body mass index (BMI) is a screening tool to identify possible weight problems. It provides an estimate of body fat based on height and weight. Your caregiver can help determine your BMI, and can help you achieve or maintain a healthy weight.For adults 20 years and older:  A BMI below 18.5 is considered underweight.  A BMI of 18.5 to 24.9 is normal.  A BMI of 25 to 29.9 is considered overweight.  A BMI of 30 and above is  considered obese.  Maintain normal blood lipids and cholesterol levels by exercising and minimizing your intake of saturated fat. Eat a balanced diet with plenty of fruit and vegetables. Blood tests for lipids and cholesterol should begin at age 20 and be repeated every 5 years. If your lipid or cholesterol levels are high, you are over 50, or you are at high risk for heart disease, you may need your cholesterol levels checked more frequently.Ongoing high lipid and cholesterol levels should be treated with medicines if diet and exercise are not effective.  If you smoke, find out from your caregiver how to quit. If you do not use tobacco, do not start.  If you are pregnant, do not drink alcohol. If you are breastfeeding, be very cautious about drinking alcohol. If you are not pregnant and choose to drink alcohol, do not exceed 1 drink per day. One drink is considered to be 12 ounces (355 mL) of beer, 5 ounces (148 mL) of wine, or 1.5 ounces (44 mL) of liquor.  Avoid use of street drugs. Do not share needles with anyone. Ask for help if you need support or instructions about stopping the use of drugs.  High blood pressure causes heart disease and increases the risk of stroke. Your blood pressure should be checked at least every 1 to 2 years. Ongoing high blood pressure should be treated with medicines if weight loss and exercise are not effective.  If you are 55 to 77 years old, ask your caregiver if you should take aspirin to prevent strokes.  Diabetes   screening involves taking a blood sample to check your fasting blood sugar level. This should be done once every 3 years, after age 45, if you are within normal weight and without risk factors for diabetes. Testing should be considered at a younger age or be carried out more frequently if you are overweight and have at least 1 risk factor for diabetes.  Breast cancer screening is essential preventive care for women. You should practice "breast  self-awareness." This means understanding the normal appearance and feel of your breasts and may include breast self-examination. Any changes detected, no matter how small, should be reported to a caregiver. Women in their 20s and 30s should have a clinical breast exam (CBE) by a caregiver as part of a regular health exam every 1 to 3 years. After age 40, women should have a CBE every year. Starting at age 40, women should consider having a mammography (breast X-ray test) every year. Women who have a family history of breast cancer should talk to their caregiver about genetic screening. Women at a high risk of breast cancer should talk to their caregivers about having magnetic resonance imaging (MRI) and a mammography every year.  The Pap test is a screening test for cervical cancer. A Pap test can show cell changes on the cervix that might become cervical cancer if left untreated. A Pap test is a procedure in which cells are obtained and examined from the lower end of the uterus (cervix).  Women should have a Pap test starting at age 21.  Between ages 21 and 29, Pap tests should be repeated every 2 years.  Beginning at age 30, you should have a Pap test every 3 years as long as the past 3 Pap tests have been normal.  Some women have medical problems that increase the chance of getting cervical cancer. Talk to your caregiver about these problems. It is especially important to talk to your caregiver if a new problem develops soon after your last Pap test. In these cases, your caregiver may recommend more frequent screening and Pap tests.  The above recommendations are the same for women who have or have not gotten the vaccine for human papillomavirus (HPV).  If you had a hysterectomy for a problem that was not cancer or a condition that could lead to cancer, then you no longer need Pap tests. Even if you no longer need a Pap test, a regular exam is a good idea to make sure no other problems are  starting.  If you are between ages 65 and 70, and you have had normal Pap tests going back 10 years, you no longer need Pap tests. Even if you no longer need a Pap test, a regular exam is a good idea to make sure no other problems are starting.  If you have had past treatment for cervical cancer or a condition that could lead to cancer, you need Pap tests and screening for cancer for at least 20 years after your treatment.  If Pap tests have been discontinued, risk factors (such as a new sexual partner) need to be reassessed to determine if screening should be resumed.  The HPV test is an additional test that may be used for cervical cancer screening. The HPV test looks for the virus that can cause the cell changes on the cervix. The cells collected during the Pap test can be tested for HPV. The HPV test could be used to screen women aged 30 years and older, and should   be used in women of any age who have unclear Pap test results. After the age of 30, women should have HPV testing at the same frequency as a Pap test.  Colorectal cancer can be detected and often prevented. Most routine colorectal cancer screening begins at the age of 50 and continues through age 75. However, your caregiver may recommend screening at an earlier age if you have risk factors for colon cancer. On a yearly basis, your caregiver may provide home test kits to check for hidden blood in the stool. Use of a small camera at the end of a tube, to directly examine the colon (sigmoidoscopy or colonoscopy), can detect the earliest forms of colorectal cancer. Talk to your caregiver about this at age 50, when routine screening begins. Direct examination of the colon should be repeated every 5 to 10 years through age 75, unless early forms of pre-cancerous polyps or small growths are found.  Hepatitis C blood testing is recommended for all people born from 1945 through 1965 and any individual with known risks for hepatitis C.  Practice  safe sex. Use condoms and avoid high-risk sexual practices to reduce the spread of sexually transmitted infections (STIs). STIs include gonorrhea, chlamydia, syphilis, trichomonas, herpes, HPV, and human immunodeficiency virus (HIV). Herpes, HIV, and HPV are viral illnesses that have no cure. They can result in disability, cancer, and death. Sexually active women aged 25 and younger should be checked for chlamydia. Older women with new or multiple partners should also be tested for chlamydia. Testing for other STIs is recommended if you are sexually active and at increased risk.  Osteoporosis is a disease in which the bones lose minerals and strength with aging. This can result in serious bone fractures. The risk of osteoporosis can be identified using a bone density scan. Women ages 65 and over and women at risk for fractures or osteoporosis should discuss screening with their caregivers. Ask your caregiver whether you should take a calcium supplement or vitamin D to reduce the rate of osteoporosis.  Menopause can be associated with physical symptoms and risks. Hormone replacement therapy is available to decrease symptoms and risks. You should talk to your caregiver about whether hormone replacement therapy is right for you.  Use sunscreen with sun protection factor (SPF) of 30 or more. Apply sunscreen liberally and repeatedly throughout the day. You should seek shade when your shadow is shorter than you. Protect yourself by wearing long sleeves, pants, a wide-brimmed hat, and sunglasses year round, whenever you are outdoors.  Once a month, do a whole body skin exam, using a mirror to look at the skin on your back. Notify your caregiver of new moles, moles that have irregular borders, moles that are larger than a pencil eraser, or moles that have changed in shape or color.  Stay current with required immunizations.  Influenza. You need a dose every fall (or winter). The composition of the flu vaccine  changes each year, so being vaccinated once is not enough.  Pneumococcal polysaccharide. You need 1 to 2 doses if you smoke cigarettes or if you have certain chronic medical conditions. You need 1 dose at age 65 (or older) if you have never been vaccinated.  Tetanus, diphtheria, pertussis (Tdap, Td). Get 1 dose of Tdap vaccine if you are younger than age 65, are over 65 and have contact with an infant, are a healthcare worker, are pregnant, or simply want to be protected from whooping cough. After that, you need a Td   booster dose every 10 years. Consult your caregiver if you have not had at least 3 tetanus and diphtheria-containing shots sometime in your life or have a deep or dirty wound.  HPV. You need this vaccine if you are a woman age 26 or younger. The vaccine is given in 3 doses over 6 months.  Measles, mumps, rubella (MMR). You need at least 1 dose of MMR if you were born in 1957 or later. You may also need a second dose.  Meningococcal. If you are age 19 to 21 and a first-year college student living in a residence hall, or have one of several medical conditions, you need to get vaccinated against meningococcal disease. You may also need additional booster doses.  Zoster (shingles). If you are age 60 or older, you should get this vaccine.  Varicella (chickenpox). If you have never had chickenpox or you were vaccinated but received only 1 dose, talk to your caregiver to find out if you need this vaccine.  Hepatitis A. You need this vaccine if you have a specific risk factor for hepatitis A virus infection or you simply wish to be protected from this disease. The vaccine is usually given as 2 doses, 6 to 18 months apart.  Hepatitis B. You need this vaccine if you have a specific risk factor for hepatitis B virus infection or you simply wish to be protected from this disease. The vaccine is given in 3 doses, usually over 6 months. Preventive Services / Frequency Ages 19 to 39  Blood  pressure check.** / Every 1 to 2 years.  Lipid and cholesterol check.** / Every 5 years beginning at age 20.  Clinical breast exam.** / Every 3 years for women in their 20s and 30s.  Pap test.** / Every 2 years from ages 21 through 29. Every 3 years starting at age 30 through age 65 or 70 with a history of 3 consecutive normal Pap tests.  HPV screening.** / Every 3 years from ages 30 through ages 65 to 70 with a history of 3 consecutive normal Pap tests.  Hepatitis C blood test.** / For any individual with known risks for hepatitis C.  Skin self-exam. / Monthly.  Influenza immunization.** / Every year.  Pneumococcal polysaccharide immunization.** / 1 to 2 doses if you smoke cigarettes or if you have certain chronic medical conditions.  Tetanus, diphtheria, pertussis (Tdap, Td) immunization. / A one-time dose of Tdap vaccine. After that, you need a Td booster dose every 10 years.  HPV immunization. / 3 doses over 6 months, if you are 26 and younger.  Measles, mumps, rubella (MMR) immunization. / You need at least 1 dose of MMR if you were born in 1957 or later. You may also need a second dose.  Meningococcal immunization. / 1 dose if you are age 19 to 21 and a first-year college student living in a residence hall, or have one of several medical conditions, you need to get vaccinated against meningococcal disease. You may also need additional booster doses.  Varicella immunization.** / Consult your caregiver.  Hepatitis A immunization.** / Consult your caregiver. 2 doses, 6 to 18 months apart.  Hepatitis B immunization.** / Consult your caregiver. 3 doses usually over 6 months. Ages 40 to 64  Blood pressure check.** / Every 1 to 2 years.  Lipid and cholesterol check.** / Every 5 years beginning at age 20.  Clinical breast exam.** / Every year after age 40.  Mammogram.** / Every year beginning at age 40   and continuing for as long as you are in good health. Consult with your  caregiver.  Pap test.** / Every 3 years starting at age 30 through age 65 or 70 with a history of 3 consecutive normal Pap tests.  HPV screening.** / Every 3 years from ages 30 through ages 65 to 70 with a history of 3 consecutive normal Pap tests.  Fecal occult blood test (FOBT) of stool. / Every year beginning at age 50 and continuing until age 75. You may not need to do this test if you get a colonoscopy every 10 years.  Flexible sigmoidoscopy or colonoscopy.** / Every 5 years for a flexible sigmoidoscopy or every 10 years for a colonoscopy beginning at age 50 and continuing until age 75.  Hepatitis C blood test.** / For all people born from 1945 through 1965 and any individual with known risks for hepatitis C.  Skin self-exam. / Monthly.  Influenza immunization.** / Every year.  Pneumococcal polysaccharide immunization.** / 1 to 2 doses if you smoke cigarettes or if you have certain chronic medical conditions.  Tetanus, diphtheria, pertussis (Tdap, Td) immunization.** / A one-time dose of Tdap vaccine. After that, you need a Td booster dose every 10 years.  Measles, mumps, rubella (MMR) immunization. / You need at least 1 dose of MMR if you were born in 1957 or later. You may also need a second dose.  Varicella immunization.** / Consult your caregiver.  Meningococcal immunization.** / Consult your caregiver.  Hepatitis A immunization.** / Consult your caregiver. 2 doses, 6 to 18 months apart.  Hepatitis B immunization.** / Consult your caregiver. 3 doses, usually over 6 months. Ages 65 and over  Blood pressure check.** / Every 1 to 2 years.  Lipid and cholesterol check.** / Every 5 years beginning at age 20.  Clinical breast exam.** / Every year after age 40.  Mammogram.** / Every year beginning at age 40 and continuing for as long as you are in good health. Consult with your caregiver.  Pap test.** / Every 3 years starting at age 30 through age 65 or 70 with a 3  consecutive normal Pap tests. Testing can be stopped between 65 and 70 with 3 consecutive normal Pap tests and no abnormal Pap or HPV tests in the past 10 years.  HPV screening.** / Every 3 years from ages 30 through ages 65 or 70 with a history of 3 consecutive normal Pap tests. Testing can be stopped between 65 and 70 with 3 consecutive normal Pap tests and no abnormal Pap or HPV tests in the past 10 years.  Fecal occult blood test (FOBT) of stool. / Every year beginning at age 50 and continuing until age 75. You may not need to do this test if you get a colonoscopy every 10 years.  Flexible sigmoidoscopy or colonoscopy.** / Every 5 years for a flexible sigmoidoscopy or every 10 years for a colonoscopy beginning at age 50 and continuing until age 75.  Hepatitis C blood test.** / For all people born from 1945 through 1965 and any individual with known risks for hepatitis C.  Osteoporosis screening.** / A one-time screening for women ages 65 and over and women at risk for fractures or osteoporosis.  Skin self-exam. / Monthly.  Influenza immunization.** / Every year.  Pneumococcal polysaccharide immunization.** / 1 dose at age 65 (or older) if you have never been vaccinated.  Tetanus, diphtheria, pertussis (Tdap, Td) immunization. / A one-time dose of Tdap vaccine if you are over   65 and have contact with an infant, are a healthcare worker, or simply want to be protected from whooping cough. After that, you need a Td booster dose every 10 years.  Varicella immunization.** / Consult your caregiver.  Meningococcal immunization.** / Consult your caregiver.  Hepatitis A immunization.** / Consult your caregiver. 2 doses, 6 to 18 months apart.  Hepatitis B immunization.** / Check with your caregiver. 3 doses, usually over 6 months. ** Family history and personal history of risk and conditions may change your caregiver's recommendations. Document Released: 08/09/2001 Document Revised: 09/05/2011  Document Reviewed: 11/08/2010 [employer] Patient Information 2013 [employer].  

## 2012-10-01 ENCOUNTER — Encounter: Payer: Self-pay | Admitting: *Deleted

## 2012-10-03 ENCOUNTER — Ambulatory Visit (INDEPENDENT_AMBULATORY_CARE_PROVIDER_SITE_OTHER)
Admission: RE | Admit: 2012-10-03 | Discharge: 2012-10-03 | Disposition: A | Discharge status: Home / Self Care | Payer: Medicare PPO | Source: Ambulatory Visit | Attending: Family Medicine | Admitting: Family Medicine

## 2012-10-03 DIAGNOSIS — M8580 Other specified disorders of bone density and structure, unspecified site: Secondary | ICD-10-CM

## 2012-10-03 DIAGNOSIS — M949 Disorder of cartilage, unspecified: Secondary | ICD-10-CM

## 2012-10-03 DIAGNOSIS — M899 Disorder of bone, unspecified: Secondary | ICD-10-CM

## 2012-10-11 ENCOUNTER — Telehealth: Payer: Self-pay | Admitting: *Deleted

## 2012-10-11 NOTE — Telephone Encounter (Signed)
We can try on of those again or something different ---

## 2012-10-11 NOTE — Telephone Encounter (Signed)
Spoke with the pt and informed her of the recent BDS results.  Pt stated she read the note on MyChart and she said that she has been on the Fosamax and Boniva in the pass.  She stated that she tried the Fosamax first and she was taking it daily, then she switched to [facility] b/c it was to be taking once a month.  Pt then stopped the Boniva b/c she started having funny feeling in her jaw, but she's not sure if the Boniva was causing the jaw problem b/s she had to go to the dentist for some problems with her teeth.  We don't have any record of her been on either of the Boniva or Fosamax.  Pt is really not sure way she stopped either one of the meds.  Pt would like to know what is the next step.//AB/CMA

## 2012-10-11 NOTE — Telephone Encounter (Signed)
Message copied by Verdie Shire on Thu Oct 11, 2012  8:41 AM ------      Message from: Lelon Perla      Created: Tue Oct 09, 2012  8:47 PM       Is she taking ca and vita d?  If yes we need to add fosamax weekly       Recheck 2 years ------

## 2012-10-15 MED ORDER — ALENDRONATE SODIUM 70 MG PO TABS: 70.0000 mg | ORAL_TABLET | ORAL | Status: DC

## 2012-10-15 NOTE — Telephone Encounter (Signed)
Spoke with the Jessica Mullins and informed her that Dr. Laury Axon recommend we can try one of the meds she was on before or something different.  Jessica Mullins agreed to try the Fosamax again.  New rx sent to the pharmacy by e-script.//AB/CMA

## 2012-12-10 ENCOUNTER — Telehealth: Payer: Self-pay | Admitting: Family Medicine

## 2012-12-10 NOTE — Telephone Encounter (Signed)
Please give pt the name and number for Dr Lou Miner

## 2012-12-10 NOTE — Telephone Encounter (Signed)
Patient states that at her visit on 06/13/12 she recalled Dr. Laury Axon giving her a verbal recommendation for a podiatry office she could call. Patient is calling because she did not write the name of the facility down at that time and wanted to know how she could reach them.

## 2012-12-10 NOTE — Telephone Encounter (Signed)
Please advise      KP 

## 2012-12-11 NOTE — Telephone Encounter (Signed)
Done

## 2012-12-14 ENCOUNTER — Ambulatory Visit (INDEPENDENT_AMBULATORY_CARE_PROVIDER_SITE_OTHER): Payer: Medicare PPO | Admitting: Family Medicine

## 2012-12-14 ENCOUNTER — Encounter: Payer: Self-pay | Admitting: Family Medicine

## 2012-12-14 VITALS — BP 138/98 | HR 57 | Temp 98.2°F | Wt 164.4 lb

## 2012-12-14 DIAGNOSIS — S139XXA Sprain of joints and ligaments of unspecified parts of neck, initial encounter: Secondary | ICD-10-CM

## 2012-12-14 DIAGNOSIS — S161XXA Strain of muscle, fascia and tendon at neck level, initial encounter: Secondary | ICD-10-CM | POA: Insufficient documentation

## 2012-12-14 DIAGNOSIS — M62838 Other muscle spasm: Secondary | ICD-10-CM

## 2012-12-14 MED ORDER — CYCLOBENZAPRINE HCL 10 MG PO TABS: 10.0000 mg | ORAL_TABLET | Freq: Three times a day (TID) | ORAL | Status: DC | PRN

## 2012-12-14 NOTE — Patient Instructions (Signed)

## 2012-12-14 NOTE — Assessment & Plan Note (Addendum)
Flexeril 10 mg 1 po tid prn con't mobic Recommended using tennis ball and leaning against wall or on floor to help relieve muscle spasms Also recommended massage rto prn

## 2012-12-14 NOTE — Progress Notes (Signed)
  Subjective:    Patient ID: Jessica Mullins, female    DOB: 02-Feb-1931, 79 y.o.   MRN: [id number]  HPI Pt here c/o neck and shoulder pain.  Pain started a few days ago after mowing the lawn. Mobic took the edge off.      Review of Systems    as above Objective:   Physical Exam BP 138/98  Pulse 57  Temp(Src) 98.2 F (36.8 C) (Oral)  Wt 164 lb 6.4 oz (74.571 kg)  BMI 30.8 kg/m2  SpO2 96% General appearance: alert, cooperative, appears stated age and no distress Neck: supple, symmetrical, trachea midline, thyroid not enlarged, symmetric, no tenderness/mass/nodules and + muscle spasm R trap neck to mid back Lungs: clear to auscultation bilaterally Heart: S1, S2 normal        Assessment & Plan:

## 2012-12-20 ENCOUNTER — Encounter: Payer: Self-pay | Admitting: Family Medicine

## 2012-12-25 ENCOUNTER — Encounter: Payer: Self-pay | Admitting: Family Medicine

## 2012-12-25 ENCOUNTER — Ambulatory Visit (INDEPENDENT_AMBULATORY_CARE_PROVIDER_SITE_OTHER): Payer: Medicare PPO | Admitting: Family Medicine

## 2012-12-25 VITALS — BP 122/76 | HR 66 | Temp 98.5°F | Wt 167.6 lb

## 2012-12-25 DIAGNOSIS — Z01810 Encounter for preprocedural cardiovascular examination: Secondary | ICD-10-CM

## 2012-12-25 NOTE — Progress Notes (Signed)
Subjective:    Jessica Mullins is a 79 y.o. female who presents to the office today for a preoperative consultation at the request of surgeon Dr Elmer Picker who plans on performing cataract removal  on tba -. This consultation is requested for the specific conditions prompting preoperative evaluation (i.e. because of potential affect on operative risk): htn and hyperlipidemia. Planned anesthesia: local. The patient has the following known anesthesia issues: none. Patients bleeding risk: no recent abnormal bleeding and no remote history of abnormal bleeding.  The following portions of the patient's history were reviewed and updated as appropriate: allergies, current medications, past family history, past medical history, past social history, past surgical history and problem list.  Review of Systems Review of Systems  Constitutional: Negative for activity change, appetite change and fatigue.  HENT: Negative for hearing loss, congestion, tinnitus and ear discharge.  dentist q14m Eyes: + cataract b/l  Respiratory: Negative for cough, chest tightness and shortness of breath.   Cardiovascular: Negative for chest pain, palpitations and leg swelling.  Gastrointestinal: Negative for abdominal pain, diarrhea, constipation and abdominal distention.  Genitourinary: Negative for urgency, frequency, decreased urine volume and difficulty urinating.  Musculoskeletal: Negative for back pain, arthralgias and gait problem.  Skin: Negative for color change, pallor and rash.  Neurological: Negative for dizziness, light-headedness, numbness and headaches.  Hematological: Negative for adenopathy. Does not bruise/bleed easily.  Psychiatric/Behavioral: Negative for suicidal ideas, confusion, sleep disturbance, self-injury, dysphoric mood, decreased concentration and agitation.        Objective:    BP 122/76  Pulse 66  Temp(Src) 98.5 F (36.9 C) (Oral)  Wt 167 lb 9.6 oz (76.023 kg)  BMI 31.4 kg/m2  SpO2  97% General appearance: alert, cooperative, appears stated age and no distress Head: Normocephalic, without obvious abnormality, atraumatic Eyes: negative findings: pupils equal, round, reactive to light and accomodation Ears: normal TM's and external ear canals both ears Nose: Nares normal. Septum midline. Mucosa normal. No drainage or sinus tenderness. Throat: lips, mucosa, and tongue normal; teeth and gums normal Neck: no adenopathy, no carotid bruit, no JVD, supple, symmetrical, trachea midline and thyroid not enlarged, symmetric, no tenderness/mass/nodules Lungs: clear to auscultation bilaterally Heart: regular rate and rhythm, S1, S2 normal, no murmur, click, rub or gallop Abdomen: soft, non-tender; bowel sounds normal; no masses,  no organomegaly Extremities: extremities normal, atraumatic, no cyanosis or edema Pulses: 2+ and symmetric Skin: Skin color, texture, turgor normal. No rashes or lesions Lymph nodes: Cervical, supraclavicular, and axillary nodes normal. Neurologic: Alert and oriented X 3, normal strength and tone. Normal symmetric reflexes. Normal coordination and gait   Cardiographics ECG: sinus brady , left axis----  no change from 2012 Echocardiogram: not done  Imaging Chest x-ray: not done   Lab Review  Office Visit on 09/25/2012  Component Date Value  . Sodium 09/25/2012 140   . Potassium 09/25/2012 3.9   . Chloride 09/25/2012 104   . CO2 09/25/2012 28   . Glucose, Bld 09/25/2012 91   . BUN 09/25/2012 19   . Creatinine, Ser 09/25/2012 0.8   . Calcium 09/25/2012 9.2   . GFR 09/25/2012 75.57   . WBC 09/25/2012 7.1   . RBC 09/25/2012 4.79   . Hemoglobin 09/25/2012 14.9   . HCT 09/25/2012 43.2   . MCV 09/25/2012 90.3   . MCHC 09/25/2012 34.3   . RDW 09/25/2012 14.1   . Platelets 09/25/2012 235.0   . Neutrophils Relative % 09/25/2012 66.7   . Lymphocytes Relative 09/25/2012 25.9   .  Monocytes Relative 09/25/2012 6.2   . Eosinophils Relative 09/25/2012  0.8   . Basophils Relative 09/25/2012 0.4   . Neutro Abs 09/25/2012 4.7   . Lymphs Abs 09/25/2012 1.8   . Monocytes Absolute 09/25/2012 0.4   . Eosinophils Absolute 09/25/2012 0.1   . Basophils Absolute 09/25/2012 0.0   . Total Bilirubin 09/25/2012 0.7   . Bilirubin, Direct 09/25/2012 0.1   . Alkaline Phosphatase 09/25/2012 60   . AST 09/25/2012 23   . ALT 09/25/2012 26   . Total Protein 09/25/2012 6.9   . Albumin 09/25/2012 4.0   . Cholesterol 09/25/2012 151   . Triglycerides 09/25/2012 119.0   . HDL 09/25/2012 57.30   . VLDL 09/25/2012 23.8   . LDL Cholesterol 09/25/2012 70   . Total CHOL/HDL Ratio 09/25/2012 3       Assessment:      79 y.o. female with planned surgery as above.   Known risk factors for perioperative complications: None     Cardiac Risk Estimation: low  Current medications which may produce withdrawal symptoms if withheld perioperatively: na      Plan:    1. Preoperative workup as follows none. 2. Change in medication regimen before surgery: none, continue medication regimen including morning of surgery, with sip of water. 3. Prophylaxis for cardiac events with perioperative beta-blockers: not indicated. 4. Invasive hemodynamic monitoring perioperatively: not indicated. 5. Deep vein thrombosis prophylaxis postoperatively-- NA 6. Surveillance for postoperative MI with ECG immediately postoperatively and on postoperative days 1 and 2 AND troponin levels 24 hours postoperatively and on day 4 or hospital discharge (whichever comes first): not indicated.

## 2013-01-18 ENCOUNTER — Other Ambulatory Visit: Payer: Self-pay

## 2013-01-18 MED ORDER — OMEPRAZOLE 20 MG PO CPDR: 20.0000 mg | DELAYED_RELEASE_CAPSULE | Freq: Every day | ORAL | Status: DC

## 2013-02-26 ENCOUNTER — Other Ambulatory Visit: Payer: Self-pay | Admitting: Family Medicine

## 2013-05-02 ENCOUNTER — Encounter: Payer: Self-pay | Admitting: Family Medicine

## 2013-05-02 ENCOUNTER — Ambulatory Visit (INDEPENDENT_AMBULATORY_CARE_PROVIDER_SITE_OTHER): Payer: Medicare PPO | Admitting: Family Medicine

## 2013-05-02 VITALS — BP 112/74 | HR 71 | Temp 98.2°F | Wt 168.6 lb

## 2013-05-02 DIAGNOSIS — N390 Urinary tract infection, site not specified: Secondary | ICD-10-CM

## 2013-05-02 DIAGNOSIS — I10 Essential (primary) hypertension: Secondary | ICD-10-CM

## 2013-05-02 DIAGNOSIS — E785 Hyperlipidemia, unspecified: Secondary | ICD-10-CM

## 2013-05-02 LAB — BASIC METABOLIC PANEL
BUN: 15 mg/dL (ref 6–23)
CO2: 30 mEq/L (ref 19–32)
Calcium: 9.5 mg/dL (ref 8.4–10.5)
Chloride: 104 mEq/L (ref 96–112)
Creatinine, Ser: 0.8 mg/dL (ref 0.4–1.2)
GFR: 75.45 mL/min (ref >60.00)
Glucose, Bld: 110 mg/dL — ABNORMAL HIGH (ref 70–99)
Potassium: 4 mEq/L (ref 3.5–5.1)
Sodium: 140 mEq/L (ref 135–145)

## 2013-05-02 LAB — POCT URINALYSIS DIPSTICK
Bilirubin, UA: NEGATIVE
Blood, UA: NEGATIVE
Glucose, UA: NEGATIVE
Ketones, UA: NEGATIVE
Nitrite, UA: NEGATIVE
Protein, UA: NEGATIVE
Spec Grav, UA: 1.01
Urobilinogen, UA: 0.2
pH, UA: 7

## 2013-05-02 LAB — LIPID PANEL
Cholesterol: 158 mg/dL (ref 0–200)
HDL: 54.6 mg/dL (ref >39.00)
LDL Cholesterol: 85 mg/dL (ref 0–99)
Total CHOL/HDL Ratio: 3
Triglycerides: 91 mg/dL (ref 0.0–149.0)
VLDL: 18.2 mg/dL (ref 0.0–40.0)

## 2013-05-02 LAB — HEPATIC FUNCTION PANEL
ALT: 21 U/L (ref 0–35)
AST: 22 U/L (ref 0–37)
Albumin: 3.9 g/dL (ref 3.5–5.2)
Alkaline Phosphatase: 53 U/L (ref 39–117)
Bilirubin, Direct: 0.1 mg/dL (ref 0.0–0.3)
Total Bilirubin: 0.7 mg/dL (ref 0.3–1.2)
Total Protein: 6.6 g/dL (ref 6.0–8.3)

## 2013-05-02 NOTE — Progress Notes (Signed)
  Subjective:    Patient here for follow-up of elevated blood pressure.  She is not exercising and is adherent to a low-salt diet.  Blood pressure is well controlled at home. Cardiac symptoms: none. Patient denies: chest pain, chest pressure/discomfort, claudication, dyspnea, exertional chest pressure/discomfort, fatigue, irregular heart beat, lower extremity edema, near-syncope, orthopnea, palpitations, paroxysmal nocturnal dyspnea, syncope and tachypnea. Cardiovascular risk factors: advanced age (older than 17 for men, 40 for women), hypertension and sedentary lifestyle. Use of agents associated with hypertension: none. History of target organ damage: none.  The following portions of the patient's history were reviewed and updated as appropriate: allergies, current medications, past family history, past medical history, past social history, past surgical history and problem list.  Review of Systems Pertinent items are noted in HPI.     Objective:    BP 112/74  Pulse 71  Temp(Src) 98.2 F (36.8 C) (Oral)  Wt 168 lb 9.6 oz (76.476 kg)  SpO2 97% General appearance: alert, cooperative, appears stated age and no distress Neck: no adenopathy, supple, symmetrical, trachea midline and thyroid not enlarged, symmetric, no tenderness/mass/nodules Lungs: clear to auscultation bilaterally Heart: S1, S2 normal Extremities: extremities normal, atraumatic, no cyanosis or edema    Assessment:    Hypertension, normal blood pressure . Evidence of target organ damage: none.    Plan:    Medication: no change. Dietary sodium restriction. Regular aerobic exercise. Check blood pressures 2-3 times weekly and record. Follow up: 3 months and as needed.

## 2013-05-02 NOTE — Patient Instructions (Addendum)
rto 6 months f/u cholesterol and htn   Cholesterol Cholesterol is a white, waxy, fat-like protein needed by your body in small amounts. The liver makes all the cholesterol you need. It is carried from the liver by the blood through the blood vessels. Deposits (plaque) may build up on blood vessel walls. This makes the arteries narrower and stiffer. Plaque increases the risk for heart attack and stroke. You cannot feel your cholesterol level even if it is very high. The only way to know is by a blood test to check your lipid (fats) levels. Once you know your cholesterol levels, you should keep a record of the test results. Work with your caregiver to to keep your levels in the desired range. WHAT THE RESULTS MEAN:  Total cholesterol is a rough measure of all the cholesterol in your blood.  LDL is the so-called bad cholesterol. This is the type that deposits cholesterol in the walls of the arteries. You want this level to be low.  HDL is the good cholesterol because it cleans the arteries and carries the LDL away. You want this level to be high.  Triglycerides are fat that the body can either burn for energy or store. High levels are closely linked to heart disease. DESIRED LEVELS:  Total cholesterol below 200.  LDL below 100 for people at risk, below 70 for very high risk.  HDL above 50 is good, above 60 is best.  Triglycerides below 150. HOW TO LOWER YOUR CHOLESTEROL:  Diet.  Choose fish or white meat chicken and [place], roasted or baked. Limit fatty cuts of red meat, fried foods, and processed meats, such as sausage and lunch meat.  Eat lots of fresh fruits and vegetables. Choose whole grains, beans, pasta, potatoes and cereals.  Use only small amounts of olive, corn or canola oils. Avoid butter, mayonnaise, shortening or palm kernel oils. Avoid foods with trans-fats.  Use skim/nonfat milk and low-fat/nonfat yogurt and cheeses. Avoid whole milk, cream, ice cream, egg yolks and  cheeses. Healthy desserts include angel food cake, ginger snaps, animal crackers, hard candy, popsicles, and low-fat/nonfat frozen yogurt. Avoid pastries, cakes, pies and cookies.  Exercise.  A regular program helps decrease LDL and raises HDL.  Helps with weight control.  Do things that increase your activity level like gardening, walking, or taking the stairs.  Medication.  May be prescribed by your caregiver to help lowering cholesterol and the risk for heart disease.  You may need medicine even if your levels are normal if you have several risk factors. HOME CARE INSTRUCTIONS   Follow your diet and exercise programs as suggested by your caregiver.  Take medications as directed.  Have blood work done when your caregiver feels it is necessary. MAKE SURE YOU:   Understand these instructions.  Will watch your condition.  Will get help right away if you are not doing well or get worse. Document Released: 03/08/2001 Document Revised: 09/05/2011 Document Reviewed: 08/29/2007 [facility] Patient Information 2014 [place], [place].

## 2013-05-02 NOTE — Assessment & Plan Note (Signed)
Stable con't meds 

## 2013-05-02 NOTE — Assessment & Plan Note (Signed)
Check labs con't meds 

## 2013-05-05 LAB — URINE CULTURE

## 2013-05-13 ENCOUNTER — Other Ambulatory Visit: Payer: Self-pay | Admitting: Family Medicine

## 2013-05-22 ENCOUNTER — Other Ambulatory Visit: Payer: Self-pay | Admitting: Family Medicine

## 2013-06-17 ENCOUNTER — Other Ambulatory Visit: Payer: Self-pay | Admitting: Family Medicine

## 2013-06-27 HISTORY — PX: CATARACT EXTRACTION: SUR2

## 2013-07-11 ENCOUNTER — Other Ambulatory Visit: Payer: Self-pay | Admitting: Family Medicine

## 2013-08-08 ENCOUNTER — Other Ambulatory Visit: Payer: Self-pay | Admitting: *Deleted

## 2013-08-08 ENCOUNTER — Telehealth: Payer: Self-pay | Admitting: *Deleted

## 2013-08-08 DIAGNOSIS — I10 Essential (primary) hypertension: Secondary | ICD-10-CM

## 2013-08-08 MED ORDER — VALSARTAN 160 MG PO TABS
160.0000 mg | ORAL_TABLET | Freq: Every day | ORAL | Status: DC
Start: 2013-08-08 — End: 2014-02-20

## 2013-08-08 NOTE — Telephone Encounter (Signed)
Patient called and stated that she received a letter from [facility] and they would like for her to use the generic brand for Diovan called Valsartan.

## 2013-08-08 NOTE — Telephone Encounter (Signed)
Done. JG//CMA 

## 2013-08-26 ENCOUNTER — Ambulatory Visit (INDEPENDENT_AMBULATORY_CARE_PROVIDER_SITE_OTHER): Payer: Medicare PPO | Admitting: Physician Assistant

## 2013-08-26 ENCOUNTER — Encounter: Payer: Self-pay | Admitting: Physician Assistant

## 2013-08-26 VITALS — BP 117/78 | HR 74 | Temp 97.9°F | Resp 14 | Ht 61.25 in | Wt 173.1 lb

## 2013-08-26 DIAGNOSIS — M7020 Olecranon bursitis, unspecified elbow: Secondary | ICD-10-CM

## 2013-08-26 DIAGNOSIS — M7022 Olecranon bursitis, left elbow: Secondary | ICD-10-CM | POA: Insufficient documentation

## 2013-08-26 NOTE — Assessment & Plan Note (Signed)
Quite impressive.  Negative for signs of sepsis.  Elevate left elbow, apply ice to area of swelling.  Referred patient to orthopedic surgery for further evaluation and possible therapeutic aspiration.  Patient educated on signs/symptoms of sepsis and when to proceed directly to the ER.

## 2013-08-26 NOTE — Patient Instructions (Signed)
Please keep your left arm/elbow elevated.  Apply ice for 15 minutes, 3-4 times per day.  Use tylenol or ibuprofen if needed for pain.  You will be contacted by Orthopedic Surgery for further evaluation and possible drainage of your bursa.  If you develop fever, chills, or if the elbow becomes red, hot-to-touch, or exquisitely tender, please proceed directly to the ER.  Olecranon Bursitis  A bursa is a fluid-filled sac that covers and protects a joint. Bursitis is when the fluid-filled sac gets puffy and sore (inflammed). Olecranon bursitis occurs over the elbow. It is often caused by falling on the elbow, a joint disorder (arthritis), or infection. It can also be caused by overuse of the elbow joint. HOME CARE  Put ice on the affected area.  Put ice in a plastic bag.  Place a towel between your skin and the bag.  Leave the ice on for 15-20 minutes each hour while awake. Do this for the first 2 days.  Raise (elevate) your elbow above your heart when resting.  Bend, straighten, and move your joint 4 times a day. Rest the injured joint at other times. When you have less pain, begin slow movements and usual activities.  Only take medicine as told by your doctor.  Limit the amount of dairy you eat and drink (milk, cheese, yogurt). GET HELP RIGHT AWAY IF:   You have more pain even with treatment.  You have a fever.  You have warmth and irritation over the fluid-filled sac and elbow.  You have a red line going up your arm.  You have pain when you move your elbow. MAKE SURE YOU:   Understand these instructions.  Will watch your condition.  Will get help right away if you are not doing well or get worse. Document Released: 12/01/2009 Document Revised: 09/05/2011 Document Reviewed: 12/01/2009 [facility] Patient Information 2014 [place].

## 2013-08-26 NOTE — Progress Notes (Signed)
Pre visit review using our clinic review tool, if applicable. No additional management support is needed unless otherwise documented below in the visit note/SLS  

## 2013-08-26 NOTE — Progress Notes (Signed)
Patient presents to clinic today c/o swelling of her left elbow first noticed Saturday.  Patient states she just happened to be dressing and noticed her L elbow was very swollen.  Patient endorses her left elbow was mildly bruised about two weeks ago, but denies known trauma or injury.  Patient denies pain, erythema, warmth of elbow, decrease in range of motion.  Patient denies fever, chills, sweats.  Denies history of bursitis.  Denies hx of trauma or L elbow.    Past Medical History  Diagnosis Date  . Hypertension   . Hyperlipidemia     Current Outpatient Prescriptions on File Prior to Visit  Medication Sig Dispense Refill  . alendronate (FOSAMAX) 70 MG tablet Take 1 tablet (70 mg total) by mouth every 7 (seven) days. Take with a full glass of water on an empty stomach.  4 tablet  11  . amLODipine (NORVASC) 2.5 MG tablet TAKE 1 TABLET BY MOUTH DAILY  90 tablet  1  . Calcium Carbonate-Vitamin D (CALTRATE 600+D) 600-400 MG-UNIT per tablet Take 1 tablet by mouth daily.        . Cholecalciferol (VITAMIN D3) 1000 UNITS CAPS Take 1 capsule by mouth daily.        . meloxicam (MOBIC) 15 MG tablet 1/2 -1 po qd prn pain  30 tablet  2  . Multiple Vitamins-Minerals (ICAPS) CAPS Take 1 capsule by mouth daily.      . Multiple Vitamins-Minerals (SENIOR MULTIVITAMIN PLUS) TABS Take by mouth.        . Omega-3 Fatty Acids (FISH OIL PO) Take by mouth. 1 tsp daily      . omeprazole (PRILOSEC) 20 MG capsule TAKE 1 CAPSULE (20 MG TOTAL) BY MOUTH DAILY.  30 capsule  11  . pravastatin (PRAVACHOL) 40 MG tablet TAKE 1 TABLET (40 MG TOTAL) BY MOUTH AT BEDTIME.  90 tablet  1  . valsartan (DIOVAN) 160 MG tablet Take 1 tablet (160 mg total) by mouth daily.  90 tablet  1  . cyclobenzaprine (FLEXERIL) 10 MG tablet Take 1 tablet (10 mg total) by mouth 3 (three) times daily as needed for muscle spasms.  30 tablet  0   No current facility-administered medications on file prior to visit.    Allergies  Allergen Reactions   . Sulfa Antibiotics     As a child    Family History  Problem Relation Age of Onset  . Lung cancer    . Thrombosis      thromboembolism clotting disorder--? father died of ? clot   . Cancer Mother     mets to bone--? primary  . Coronary artery disease Brother     died of M! @ 16  . Diabetes Brother   . Hyperlipidemia Brother   . Hypertension Brother   . Heart disease Brother     chf  . Heart disease Father 88    MI  . Cancer Sister 7    lung  . Diabetes Maternal Aunt     History   Social History  . Marital Status: Widowed    Spouse Name: N/A    Number of Children: N/A  . Years of Education: N/A   Occupational History  . teacher     retired   Social History Main Topics  . Smoking status: Never Smoker   . Smokeless tobacco: Never Used  . Alcohol Use: Yes  . Drug Use: No  . Sexual Activity: None   Other Topics Concern  . None  Social History Narrative  . None   Review of Systems - See HPI.  All other ROS are negative.  BP 117/78  Pulse 74  Temp(Src) 97.9 F (36.6 C) (Oral)  Resp 14  Ht 5' 1.25" (1.556 m)  Wt 173 lb 2 oz (78.529 kg)  BMI 32.43 kg/m2  SpO2 97%  Physical Exam  Vitals reviewed. Constitutional: She is oriented to person, place, and time and well-developed, well-nourished, and in no distress.  HENT:  Head: Normocephalic and atraumatic.  Eyes: Conjunctivae are normal. Pupils are equal, round, and reactive to light.  Cardiovascular: Normal rate, regular rhythm and normal heart sounds.   Pulmonary/Chest: Effort normal and breath sounds normal.  Musculoskeletal:       Left shoulder: Normal.       Left elbow: She exhibits swelling and effusion. She exhibits normal range of motion, no deformity and no laceration. No tenderness found.       Left wrist: Normal.       Left upper arm: Normal.       Left forearm: Normal.       Left hand: Normal. Normal sensation noted.  Neurological: She is alert and oriented to person, place, and time.   Skin: Skin is warm and dry. No rash noted.   Assessment/Plan: Olecranon bursitis of left elbow Quite impressive.  Negative for signs of sepsis.  Elevate left elbow, apply ice to area of swelling.  Referred patient to orthopedic surgery for further evaluation and possible therapeutic aspiration.  Patient educated on signs/symptoms of sepsis and when to proceed directly to the ER.

## 2013-09-02 ENCOUNTER — Other Ambulatory Visit: Payer: Self-pay | Admitting: Family Medicine

## 2013-09-12 ENCOUNTER — Other Ambulatory Visit: Payer: Self-pay | Admitting: Family Medicine

## 2013-09-17 ENCOUNTER — Other Ambulatory Visit: Payer: Self-pay | Admitting: Family Medicine

## 2013-11-05 ENCOUNTER — Encounter: Payer: Self-pay | Admitting: Family Medicine

## 2013-11-05 ENCOUNTER — Ambulatory Visit (INDEPENDENT_AMBULATORY_CARE_PROVIDER_SITE_OTHER): Payer: Medicare PPO | Admitting: Family Medicine

## 2013-11-05 VITALS — BP 114/60 | HR 62 | Temp 98.4°F | Wt 168.6 lb

## 2013-11-05 DIAGNOSIS — E785 Hyperlipidemia, unspecified: Secondary | ICD-10-CM

## 2013-11-05 DIAGNOSIS — I10 Essential (primary) hypertension: Secondary | ICD-10-CM

## 2013-11-05 DIAGNOSIS — H538 Other visual disturbances: Secondary | ICD-10-CM

## 2013-11-05 LAB — HEPATIC FUNCTION PANEL
ALT: 19 U/L (ref 0–35)
AST: 22 U/L (ref 0–37)
Albumin: 3.8 g/dL (ref 3.5–5.2)
Alkaline Phosphatase: 47 U/L (ref 39–117)
Bilirubin, Direct: 0 mg/dL (ref 0.0–0.3)
Total Bilirubin: 0.9 mg/dL (ref 0.2–1.2)
Total Protein: 6.4 g/dL (ref 6.0–8.3)

## 2013-11-05 LAB — LIPID PANEL
Cholesterol: 167 mg/dL (ref 0–200)
HDL: 62.7 mg/dL (ref >39.00)
LDL Cholesterol: 88 mg/dL (ref 0–99)
Total CHOL/HDL Ratio: 3
Triglycerides: 81 mg/dL (ref 0.0–149.0)
VLDL: 16.2 mg/dL (ref 0.0–40.0)

## 2013-11-05 LAB — BASIC METABOLIC PANEL
BUN: 16 mg/dL (ref 6–23)
CO2: 30 mEq/L (ref 19–32)
Calcium: 9.8 mg/dL (ref 8.4–10.5)
Chloride: 105 mEq/L (ref 96–112)
Creatinine, Ser: 0.7 mg/dL (ref 0.4–1.2)
GFR: 82.65 mL/min (ref >60.00)
Glucose, Bld: 86 mg/dL (ref 70–99)
Potassium: 4.1 mEq/L (ref 3.5–5.1)
Sodium: 142 mEq/L (ref 135–145)

## 2013-11-05 NOTE — Progress Notes (Signed)
Pre visit review using our clinic review tool, if applicable. No additional management support is needed unless otherwise documented below in the visit note. 

## 2013-11-05 NOTE — Progress Notes (Signed)
  Subjective:    Patient here for follow-up of elevated blood pressure.  She is not exercising and is adherent to a low-salt diet.  Blood pressure is well controlled at home. Cardiac symptoms: none. Patient denies: chest pain, chest pressure/discomfort, claudication, dyspnea, exertional chest pressure/discomfort, fatigue, irregular heart beat, lower extremity edema, near-syncope, orthopnea, palpitations, paroxysmal nocturnal dyspnea, syncope and tachypnea. Cardiovascular risk factors: advanced age (older than 60 for men, 49 for women), dyslipidemia, hypertension and sedentary lifestyle. Use of agents associated with hypertension: none. History of target organ damage: none.  The following portions of the patient's history were reviewed and updated as appropriate: allergies, current medications, past family history, past medical history, past social history, past surgical history and problem list.  Review of Systems Pertinent items are noted in HPI.     Objective:    BP 114/60  Pulse 62  Temp(Src) 98.4 F (36.9 C) (Oral)  Wt 168 lb 9.6 oz (76.476 kg)  SpO2 96% General appearance: alert, cooperative, appears stated age and no distress Throat: lips, mucosa, and tongue normal; teeth and gums normal Neck: no adenopathy, no carotid bruit, no JVD, supple, symmetrical, trachea midline and thyroid not enlarged, symmetric, no tenderness/mass/nodules Lungs: clear to auscultation bilaterally Heart: S1, S2 normal Extremities: extremities normal, atraumatic, no cyanosis or edema    Assessment:    Hypertension, normal blood pressure . Evidence of target organ damage: none.    Plan:    Medication: no change. Dietary sodium restriction. Regular aerobic exercise. Follow up: 6 months and as needed.   1. HTN (hypertension) Stable  - Basic metabolic panel  2. Other and unspecified hyperlipidemia Check labs - Hepatic function panel - Lipid panel

## 2013-11-05 NOTE — Patient Instructions (Signed)

## 2014-01-13 ENCOUNTER — Encounter: Payer: Self-pay | Admitting: Physician Assistant

## 2014-01-13 ENCOUNTER — Ambulatory Visit (INDEPENDENT_AMBULATORY_CARE_PROVIDER_SITE_OTHER): Payer: Medicare PPO | Admitting: Physician Assistant

## 2014-01-13 VITALS — BP 112/88 | HR 64 | Temp 98.5°F | Resp 16 | Ht 61.25 in | Wt 167.1 lb

## 2014-01-13 DIAGNOSIS — M5380 Other specified dorsopathies, site unspecified: Secondary | ICD-10-CM

## 2014-01-13 DIAGNOSIS — M62830 Muscle spasm of back: Secondary | ICD-10-CM

## 2014-01-13 MED ORDER — MELOXICAM 7.5 MG PO TABS: 7.5000 mg | ORAL_TABLET | Freq: Every day | ORAL | Status: DC

## 2014-01-13 MED ORDER — DIAZEPAM 2 MG PO TABS: 2.0000 mg | ORAL_TABLET | Freq: Every evening | ORAL | Status: DC | PRN

## 2014-01-13 NOTE — Patient Instructions (Signed)
Take Mobic once daily with food over the next week.  Use Valium at night for muscle spasm. Apply a topical Aspercreme to your lower back.  Use a heating pad in 15-minute intervals.  Avoid heavy lifting or overexertion.  Please call or return to clinic if symptoms have not improved over the next week.

## 2014-01-13 NOTE — Assessment & Plan Note (Signed)
Rx Mobic to take daily with food.  Rx Valium QHS for spasm. Topical Aspercreme.  Heating pad to affected area.  Avoid heavy lifting or overexertion.  Return precautions discussed with patient.

## 2014-01-13 NOTE — Progress Notes (Signed)
Pre visit review using our clinic review tool, if applicable. No additional management support is needed unless otherwise documented below in the visit note/SLS  

## 2014-01-13 NOTE — Progress Notes (Signed)
Patient presents to clinic today c/o right-sided low back pain without radiation first noted 2 days ago after working out in yard.  Denies trauma or injury.  Denies heavy lifting.  Denies pain elsewhere. Has not taken anything for her symptoms.  Past Medical History  Diagnosis Date  . Hypertension   . Hyperlipidemia     Current Outpatient Prescriptions on File Prior to Visit  Medication Sig Dispense Refill  . alendronate (FOSAMAX) 70 MG tablet TAKE 1 TABLET (70 MG TOTAL) BY MOUTH EVERY SEVEN   DAYS. TAKE WITH A FULL GLASS OF WATER ON AT NOON TIME   EMPTY STOMACH.  4 tablet  11  . Calcium Carbonate-Vitamin D (CALTRATE 600+D) 600-400 MG-UNIT per tablet Take 2 tablets by mouth daily.       . Cholecalciferol (VITAMIN D3) 1000 UNITS CAPS Take 1 capsule by mouth daily.        [name] doxycycline (VIBRAMYCIN) 100 MG capsule       . Multiple Vitamins-Minerals (ICAPS) CAPS Take 2 capsules by mouth daily.       . Omega-3 Fatty Acids (FISH OIL PO) Take by mouth. 1 tsp daily      . omeprazole (PRILOSEC) 20 MG capsule TAKE 1 CAPSULE (20 MG TOTAL) BY MOUTH DAILY.  30 capsule  11  . pravastatin (PRAVACHOL) 40 MG tablet 1 tab by mouth daily--repeat labs due now  90 tablet  0  . valsartan (DIOVAN) 160 MG tablet Take 1 tablet (160 mg total) by mouth daily.  90 tablet  1   No current facility-administered medications on file prior to visit.    Allergies  Allergen Reactions  . Sulfa Antibiotics     As a child    Family History  Problem Relation Age of Onset  . Lung cancer    . Thrombosis      thromboembolism clotting disorder--? father died of ? clot   . Cancer Mother     mets to bone--? primary  . Coronary artery disease Brother     died of M! @ 19  . Diabetes Brother   . Hyperlipidemia Brother   . Hypertension Brother   . Heart disease Brother     chf  . Heart disease Father 69    MI  . Cancer Sister 68    lung  . Diabetes Maternal Aunt     History   Social History  . Marital Status:  Widowed    Spouse Name: N/A    Number of Children: N/A  . Years of Education: N/A   Occupational History  . teacher     retired   Social History Main Topics  . Smoking status: Never Smoker   . Smokeless tobacco: Never Used  . Alcohol Use: Yes  . Drug Use: No  . Sexual Activity: None   Other Topics Concern  . None   Social History Narrative  . None    Review of Systems - See HPI.  All other ROS are negative.  BP 112/88  Pulse 64  Temp(Src) 98.5 F (36.9 C) (Oral)  Resp 16  Ht 5' 1.25" (1.556 m)  Wt 167 lb 2 oz (75.807 kg)  BMI 31.31 kg/m2  SpO2 95%  Physical Exam  Vitals reviewed. Constitutional: She is oriented to person, place, and time and well-developed, well-nourished, and in no distress.  HENT:  Head: Normocephalic and atraumatic.  Cardiovascular: Normal rate, regular rhythm, normal heart sounds and intact distal pulses.   No murmur heard. Pulmonary/Chest: Effort  normal and breath sounds normal. No respiratory distress. She has no wheezes. She has no rales. She exhibits no tenderness.  Musculoskeletal:       Cervical back: Normal.       Thoracic back: Normal.       Lumbar back: She exhibits tenderness and spasm. She exhibits normal range of motion and no bony tenderness.  Neurological: She is alert and oriented to person, place, and time. She has normal reflexes.  Skin: Skin is warm and dry. No rash noted.  Psychiatric: Affect normal.    Recent Results (from the past 2160 hour(s))  BASIC METABOLIC PANEL     Status: None   Collection Time    11/05/13 11:01 AM      Result Value Ref Range   Sodium 142  135 - 145 mEq/L   Potassium 4.1  3.5 - 5.1 mEq/L   Chloride 105  96 - 112 mEq/L   CO2 30  19 - 32 mEq/L   Glucose, Bld 86  70 - 99 mg/dL   BUN 16  6 - 23 mg/dL   Creatinine, Ser 0.7  0.4 - 1.2 mg/dL   Calcium 9.8  8.4 - 10.5 mg/dL   GFR 82.65  >60.00 mL/min  HEPATIC FUNCTION PANEL     Status: None   Collection Time    11/05/13 11:01 AM      Result  Value Ref Range   Total Bilirubin 0.9  0.2 - 1.2 mg/dL   Bilirubin, Direct 0.0  0.0 - 0.3 mg/dL   Alkaline Phosphatase 47  39 - 117 U/L   AST 22  0 - 37 U/L   ALT 19  0 - 35 U/L   Total Protein 6.4  6.0 - 8.3 g/dL   Albumin 3.8  3.5 - 5.2 g/dL  LIPID PANEL     Status: None   Collection Time    11/05/13 11:01 AM      Result Value Ref Range   Cholesterol 167  0 - 200 mg/dL   Comment: ATP III Classification       Desirable:  < 200 mg/dL               Borderline High:  200 - 239 mg/dL          High:  > = 240 mg/dL   Triglycerides 81.0  0.0 - 149.0 mg/dL   Comment: Normal:  <150 mg/dLBorderline High:  150 - 199 mg/dL   HDL 62.70  >39.00 mg/dL   VLDL 16.2  0.0 - 40.0 mg/dL   LDL Cholesterol 88  0 - 99 mg/dL   Total CHOL/HDL Ratio 3     Comment:                Men          Women1/2 Average Risk     3.4          3.3Average Risk          5.0          4.42X Average Risk          9.6          7.13X Average Risk          15.0          11.0                        Assessment/Plan: Muscle spasm of back Rx Mobic to take daily  with food.  Rx Valium QHS for spasm. Topical Aspercreme.  Heating pad to affected area.  Avoid heavy lifting or overexertion.  Return precautions discussed with patient.

## 2014-01-27 ENCOUNTER — Encounter: Payer: Self-pay | Admitting: Family Medicine

## 2014-01-27 ENCOUNTER — Ambulatory Visit (INDEPENDENT_AMBULATORY_CARE_PROVIDER_SITE_OTHER): Payer: Medicare PPO | Admitting: Family Medicine

## 2014-01-27 VITALS — BP 150/84 | HR 69 | Temp 97.8°F | Ht 61.25 in | Wt 167.2 lb

## 2014-01-27 DIAGNOSIS — M25519 Pain in unspecified shoulder: Secondary | ICD-10-CM

## 2014-01-27 NOTE — Progress Notes (Signed)
Pre visit review using our clinic review tool, if applicable. No additional management support is needed unless otherwise documented below in the visit note. 

## 2014-01-27 NOTE — Patient Instructions (Signed)

## 2014-01-27 NOTE — Progress Notes (Signed)
   Subjective:    Patient ID: Jessica Mullins, female    DOB: 1930/10/13, 80 y.o.   MRN: [id number]  HPI Pt here c/o b/l shoulder/arm pain x since last ov.  No known injury.  Pain in trap b/l.  No dec rom.  Pt describes muscle ache and spasm.    Review of Systems As above.    Objective:   Physical Exam BP 150/84  Pulse 69  Temp(Src) 97.8 F (36.6 C) (Oral)  Ht 5' 1.25" (1.556 m)  Wt 167 lb 3.2 oz (75.841 kg)  BMI 31.32 kg/m2  SpO2 96% General appearance: alert, cooperative, appears stated age and no distress Extremities: extremities normal, atraumatic, no cyanosis or edema Neurologic: Motor: normal rom b/l upper ext                + muscle spasm b/l trap -- base of skull out to shoulers and upper back        Assessment & Plan:  1. Pain in joint, shoulder region, unspecified laterality Use valium 2 mg and mobic 7.5-15 mg daily Alt ice and heat Refer to sport med if no better

## 2014-02-10 ENCOUNTER — Encounter: Payer: Self-pay | Admitting: Family Medicine

## 2014-02-20 ENCOUNTER — Other Ambulatory Visit: Payer: Self-pay | Admitting: Family Medicine

## 2014-02-24 ENCOUNTER — Encounter: Payer: Self-pay | Admitting: Family Medicine

## 2014-02-24 ENCOUNTER — Ambulatory Visit (INDEPENDENT_AMBULATORY_CARE_PROVIDER_SITE_OTHER): Payer: Medicare PPO | Admitting: Family Medicine

## 2014-02-24 ENCOUNTER — Other Ambulatory Visit: Payer: Self-pay | Admitting: Physician Assistant

## 2014-02-24 VITALS — BP 108/66 | HR 67 | Temp 98.5°F | Wt 167.8 lb

## 2014-02-24 DIAGNOSIS — M25519 Pain in unspecified shoulder: Secondary | ICD-10-CM

## 2014-02-24 DIAGNOSIS — M25511 Pain in right shoulder: Secondary | ICD-10-CM

## 2014-02-24 DIAGNOSIS — R079 Chest pain, unspecified: Secondary | ICD-10-CM

## 2014-02-24 MED ORDER — MELOXICAM 7.5 MG PO TABS: ORAL_TABLET | ORAL | Status: DC

## 2014-02-24 NOTE — Progress Notes (Signed)
Pre visit review using our clinic review tool, if applicable. No additional management support is needed unless otherwise documented below in the visit note. 

## 2014-02-24 NOTE — Patient Instructions (Signed)

## 2014-02-24 NOTE — Telephone Encounter (Signed)
Last seen and filled 01/13/14 #30 Please advise      KP

## 2014-02-24 NOTE — Progress Notes (Signed)
  Subjective:    Jessica Mullins is a 80 y.o. female who presents with right shoulder pain. The symptoms began several weeks ago. Aggravating factors: no known event. Pain is located diffusely throughout the shoulder. Discomfort is described as aching and sharp/stabbing. Symptoms are exacerbated by no movement-- it just happens. Evaluation to date: none. Therapy to date includes: rest and ice.  It comes and goes and does not bother her today.  It moves around from chest to neck and r side back  The following portions of the patient's history were reviewed and updated as appropriate: allergies, current medications, past family history, past medical history, past social history, past surgical history and problem list.  Review of Systems Pertinent items are noted in HPI.   Objective:    BP 108/66  Pulse 67  Temp(Src) 98.5 F (36.9 C) (Oral)  Wt 167 lb 12.3 oz (76.1 kg)  SpO2 95% Right shoulder: normal active ROM, no tenderness, no impingement sign  Left shoulder: normal active ROM, no tenderness, no impingement sign   shoulder is not bothering her today-- abd-- soft , NT   ekg- no acute changes Assessment:    Right shoulder pain    Plan:    Gentle ROM exercises. Rest, ice, compression, and elevation (RICE) therapy. NSAIDs per medication orders. sports med referral

## 2014-02-25 ENCOUNTER — Encounter: Payer: Self-pay | Admitting: Family Medicine

## 2014-03-13 ENCOUNTER — Encounter: Payer: Self-pay | Admitting: Family Medicine

## 2014-03-13 ENCOUNTER — Ambulatory Visit (INDEPENDENT_AMBULATORY_CARE_PROVIDER_SITE_OTHER): Payer: Medicare PPO | Admitting: Family Medicine

## 2014-03-13 ENCOUNTER — Other Ambulatory Visit (INDEPENDENT_AMBULATORY_CARE_PROVIDER_SITE_OTHER): Payer: Medicare PPO

## 2014-03-13 VITALS — BP 152/90 | HR 71 | Ht 61.25 in | Wt 168.0 lb

## 2014-03-13 DIAGNOSIS — M25511 Pain in right shoulder: Secondary | ICD-10-CM

## 2014-03-13 DIAGNOSIS — M25519 Pain in unspecified shoulder: Secondary | ICD-10-CM

## 2014-03-13 NOTE — Progress Notes (Signed)
[facility] [address], [place] [address] Phone: [phone] Subjective:    I'm seeing this patient by the request  of:  Garnet Koyanagi, DO  CC:  Shoulder pain  [id number] Jessica Mullins is a 80 y.o. female coming in with complaint of shoulder pain. Patient has had the pain bilaterally previously. Seems to be giving her more problems on the right side recently. Patient does not remember in nature injury. Patient describes the discomfort as more of an aching sensation with a sharp pain with certain movements. Patient states reaching behind her back or overhead and seems to give her some increased pain but then it can occur at any time. Patient has tried rest and ice for it with no significant improvement. Patient was recently given an anti-inflammatory to try. Patient states that she has not try this medication yet. Rates the severity pain is 7/10    Past medical history, social, surgical and family history all reviewed in electronic medical record.   Review of Systems: No headache, visual changes, nausea, vomiting, diarrhea, constipation, dizziness, abdominal pain, skin rash, fevers, chills, night sweats, weight loss, swollen lymph nodes, body aches, joint swelling, muscle aches, chest pain, shortness of breath, mood changes.   Objective Blood pressure 152/90, pulse 71, height 5' 1.25" (1.556 m), weight 168 lb (76.204 kg), SpO2 97.00%.  General: No apparent distress alert and oriented x3 mood and affect normal, dressed appropriately.  HEENT: Pupils equal, extraocular movements intact  Respiratory: Patient's speak in full sentences and does not appear short of breath  Cardiovascular: No lower extremity edema, non tender, no erythema  Skin: Warm dry intact with no signs of infection or rash on extremities or on axial skeleton.  Abdomen: Soft nontender  Neuro: Cranial nerves II through XII are intact, neurovascularly intact in all extremities with 2+  DTRs and 2+ pulses.  Lymph: No lymphadenopathy of posterior or anterior cervical chain or axillae bilaterally.  Gait normal with good balance and coordination.  MSK:  Non tender with full range of motion and good stability and symmetric strength and tone of elbows, wrist, hip, knee and ankles bilaterally.  Shoulder: Right Inspection reveals no abnormalities, atrophy or asymmetry. Palpation is normal with no tenderness over AC joint or bicipital groove. ROM is full in all planes passively. Rotator cuff strength normal throughout. signs of impingement with positive Neer and Hawkin's tests, but negative empty can sign. Speeds and Yergason's tests normal. No labral pathology noted with negative Obrien's, negative clunk and good stability. Normal scapular function observed. No painful arc and no drop arm sign. No apprehension sign Contralateral shoulder unremarkable  MSK US performed of: Right This study was ordered, performed, and interpreted by Charlann Boxer D.O.  Shoulder:   Supraspinatus:  Appears normal on long and transverse views, Bursal bulge seen with shoulder abduction on impingement view. Infraspinatus:  Appears normal on long and transverse views. Significant increase in Doppler flow Subscapularis:  Appears normal on long and transverse views. Positive bursa Teres Minor:  Appears normal on long and transverse views. AC joint:  Capsule undistended, no geyser sign. Glenohumeral Joint:  Appears normal without effusion. Glenoid Labrum:  Intact without visualized tears. Biceps Tendon:  Appears normal on long and transverse views, no fraying of tendon, tendon located in intertubercular groove, no subluxation with shoulder internal or external rotation.  Impression: Subacromial bursitis  Procedure: Real-time Ultrasound Guided Injection of right glenohumeral joint Device: GE Logiq E  Ultrasound guided injection is preferred  based studies that show increased duration, increased effect,  greater accuracy, decreased procedural pain, increased response rate with ultrasound guided versus blind injection.  Verbal informed consent obtained.  Time-out conducted.  Noted no overlying erythema, induration, or other signs of local infection.  Skin prepped in a sterile fashion.  Local anesthesia: Topical Ethyl chloride.  With sterile technique and under real time ultrasound guidance:  Joint visualized.  23g 1  inch needle inserted posterior approach. Pictures taken for needle placement. Patient did have injection of 2 cc of 1% lidocaine, 2 cc of 0.5% Marcaine, and 1.0 cc of Kenalog 40 mg/dL. Completed without difficulty  Pain immediately resolved suggesting accurate placement of the medication.  Advised to call if fevers/chills, erythema, induration, drainage, or persistent bleeding.  Images permanently stored and available for review in the ultrasound unit.  Impression: Technically successful ultrasound guided injection.     Impression and Recommendations:     This case required medical decision making of moderate complexity.

## 2014-03-13 NOTE — Patient Instructions (Signed)
Very nice to meet you Ice 20 minutes 2 times daily. Usually after activity and before bed. Exercises 3 times a week.  Take tylenol 650 mg three times a day is the best evidence based medicine we have for arthritis.  Glucosamine sulfate 1500mg  daily. Vitamin D 2000 IU daily Fish oil 2 grams daily.  Tumeric 500mg  twice daily.  Capsaicin topically up to four times a day may also help with pain. Cortisone injections are an option if these interventions do not seem to make a difference or need more relief. .  It's important that you continue to stay active. Consider physical therapy to strengthen muscles around the joint that hurts to take pressure off of the joint itself. Come back and see me in 3-4 weeks.

## 2014-03-26 ENCOUNTER — Encounter: Payer: Self-pay | Admitting: Family Medicine

## 2014-04-10 ENCOUNTER — Ambulatory Visit (INDEPENDENT_AMBULATORY_CARE_PROVIDER_SITE_OTHER): Payer: Medicare PPO | Admitting: Family Medicine

## 2014-04-10 ENCOUNTER — Encounter: Payer: Self-pay | Admitting: Family Medicine

## 2014-04-10 VITALS — BP 134/86 | HR 65 | Ht 61.0 in | Wt 166.0 lb

## 2014-04-10 DIAGNOSIS — M25511 Pain in right shoulder: Secondary | ICD-10-CM | POA: Insufficient documentation

## 2014-04-10 NOTE — Assessment & Plan Note (Signed)
Patient is doing significantly better after the injection at this time. Encourage patient to continue the exercises 3 times a week for another 6 weeks. We discussed continuing icing protocol as well. Patient knows that she can take Tylenol if she has any breakthrough pain at this time. Patient will followup with me on an as-needed basis.

## 2014-04-10 NOTE — Progress Notes (Signed)
  [facility] [address] [place], [place] [address] Phone: [phone] Subjective:    CC:  Shoulder pain, right side in followup  [id number] Jessica Mullins is a 80 y.o. female coming in with complaint of shoulder pain. Patient was seen last time and had more findings that is consistent with shoulder bursitis and was given an injection. Patient was also encouraged to do and icing protocol, home exercises, and we discussed over-the-counter medications that could be beneficial. Patient unfortunately did have a small allergy to the glucosamine and stop the medication. Patient states that she is 95% better. Patient is able to do light duty today living and is feeling much better. Able to sleep comfortably at night. Patient continues to do the icing regularly and the exercises most days a week. Patient is very happy with the results.    Past medical history, social, surgical and family history all reviewed in electronic medical record.   Review of Systems: No headache, visual changes, nausea, vomiting, diarrhea, constipation, dizziness, abdominal pain, skin rash, fevers, chills, night sweats, weight loss, swollen lymph nodes, body aches, joint swelling, muscle aches, chest pain, shortness of breath, mood changes.   Objective Blood pressure 134/86, pulse 65, height 5\' 1"  (1.549 m), weight 166 lb (75.297 kg), SpO2 97.00%.  General: No apparent distress alert and oriented x3 mood and affect normal, dressed appropriately.  HEENT: Pupils equal, extraocular movements intact  Respiratory: Patient's speak in full sentences and does not appear short of breath  Cardiovascular: No lower extremity edema, non tender, no erythema  Skin: Warm dry intact with no signs of infection or rash on extremities or on axial skeleton.  Abdomen: Soft nontender  Neuro: Cranial nerves II through XII are intact, neurovascularly intact in all extremities with 2+ DTRs and 2+ pulses.  Lymph: No  lymphadenopathy of posterior or anterior cervical chain or axillae bilaterally.  Gait normal with good balance and coordination.  MSK:  Non tender with full range of motion and good stability and symmetric strength and tone of elbows, wrist, hip, knee and ankles bilaterally.  Shoulder: Right Inspection reveals no abnormalities, atrophy or asymmetry. Palpation is normal with no tenderness over AC joint or bicipital groove. ROM is full in all planes passively. Rotator cuff strength normal throughout. Mild signs of impingement still left. Speeds and Yergason's tests normal. No labral pathology noted with negative Obrien's, negative clunk and good stability. Normal scapular function observed. No painful arc and no drop arm sign. No apprehension sign Contralateral shoulder unremarkable       Impression and Recommendations:     This case required medical decision making of moderate complexity.

## 2014-04-10 NOTE — Patient Instructions (Signed)
You are doing amazing!!! Ice still at end of the day Continue the exercises 3 times a week for 3 weeks.  Come back again as needed.

## 2014-05-13 ENCOUNTER — Encounter: Payer: Self-pay | Admitting: Family Medicine

## 2014-05-13 ENCOUNTER — Ambulatory Visit (INDEPENDENT_AMBULATORY_CARE_PROVIDER_SITE_OTHER): Payer: Medicare PPO | Admitting: Family Medicine

## 2014-05-13 VITALS — BP 122/82 | HR 57 | Temp 97.5°F | Wt 167.4 lb

## 2014-05-13 DIAGNOSIS — Z23 Encounter for immunization: Secondary | ICD-10-CM

## 2014-05-13 DIAGNOSIS — E785 Hyperlipidemia, unspecified: Secondary | ICD-10-CM

## 2014-05-13 DIAGNOSIS — I10 Essential (primary) hypertension: Secondary | ICD-10-CM

## 2014-05-13 DIAGNOSIS — L718 Other rosacea: Secondary | ICD-10-CM

## 2014-05-13 LAB — HEPATIC FUNCTION PANEL
ALT: 20 U/L (ref 0–35)
AST: 18 U/L (ref 0–37)
Albumin: 4.1 g/dL (ref 3.5–5.2)
Alkaline Phosphatase: 58 U/L (ref 39–117)
Bilirubin, Direct: 0 mg/dL (ref 0.0–0.3)
Total Bilirubin: 0.7 mg/dL (ref 0.2–1.2)
Total Protein: 6.6 g/dL (ref 6.0–8.3)

## 2014-05-13 LAB — BASIC METABOLIC PANEL
BUN: 16 mg/dL (ref 6–23)
CO2: 31 mEq/L (ref 19–32)
Calcium: 9.7 mg/dL (ref 8.4–10.5)
Chloride: 108 mEq/L (ref 96–112)
Creatinine, Ser: 0.7 mg/dL (ref 0.4–1.2)
GFR: 79.97 mL/min (ref >60.00)
Glucose, Bld: 101 mg/dL — ABNORMAL HIGH (ref 70–99)
Potassium: 5 mEq/L (ref 3.5–5.1)
Sodium: 142 mEq/L (ref 135–145)

## 2014-05-13 LAB — LIPID PANEL
Cholesterol: 152 mg/dL (ref 0–200)
HDL: 64.5 mg/dL (ref >39.00)
LDL Cholesterol: 72 mg/dL (ref 0–99)
NonHDL: 87.5
Total CHOL/HDL Ratio: 2
Triglycerides: 76 mg/dL (ref 0.0–149.0)
VLDL: 15.2 mg/dL (ref 0.0–40.0)

## 2014-05-13 NOTE — Patient Instructions (Signed)

## 2014-05-13 NOTE — Progress Notes (Signed)
Subjective:    Patient here for follow-up of elevated blood pressure.  She is not exercising and is adherent to a low-salt diet.  Blood pressure is well controlled at home. Cardiac symptoms: none. Patient denies: chest pain, chest pressure/discomfort, claudication, dyspnea, exertional chest pressure/discomfort, fatigue, irregular heart beat, lower extremity edema, near-syncope, orthopnea, palpitations, paroxysmal nocturnal dyspnea, syncope and tachypnea. Cardiovascular risk factors: advanced age (older than 69 for men, 35 for women), family history of premature cardiovascular disease, hypertension and sedentary lifestyle. Use of agents associated with hypertension: none. History of target organ damage: none.  The following portions of the patient's history were reviewed and updated as appropriate:  She  has a past medical history of Hypertension and Hyperlipidemia. She  does not have any pertinent problems on file. She  has past surgical history that includes Abdominal hysterectomy; Appendectomy; Ganglion cyst excision; and Breast lumpectomy. Her family history includes Cancer in her mother; Cancer (age of onset: 14) in her sister; Coronary artery disease in her brother; Diabetes in her brother and maternal aunt; Heart disease in her brother; Heart disease (age of onset: 25) in her father; Hyperlipidemia in her brother; Hypertension in her brother; Lung cancer in an other family member; Thrombosis in an other family member. She  reports that she has never smoked. She has never used smokeless tobacco. She reports that she drinks alcohol. She reports that she does not use illicit drugs. She has a current medication list which includes the following prescription(s): alendronate, biotin, calcium carbonate-vitamin d, vitamin d3, doxycycline, meloxicam, multiple vitamins-minerals, omega-3 fatty acids, omeprazole, pravastatin, turmeric, and valsartan. Current Outpatient Prescriptions on File Prior to Visit   Medication Sig Dispense Refill  . alendronate (FOSAMAX) 70 MG tablet TAKE 1 TABLET (70 MG TOTAL) BY MOUTH EVERY SEVEN   DAYS. TAKE WITH A FULL GLASS OF WATER ON AT NOON TIME   EMPTY STOMACH. 4 tablet 11  . Biotin (BIOTIN 5000) 5 MG CAPS Take by mouth.    . Calcium Carbonate-Vitamin D (CALTRATE 600+D) 600-400 MG-UNIT per tablet Take 2 tablets by mouth daily.     . Cholecalciferol (VITAMIN D3) 1000 UNITS CAPS Take 1 capsule by mouth daily.      [name] doxycycline (VIBRAMYCIN) 100 MG capsule     . meloxicam (MOBIC) 7.5 MG tablet TAKE 1 TABLET (7.5 MG TOTAL) BY MOUTH DAILY. 30 tablet 5  . Multiple Vitamins-Minerals (ICAPS MV PO) Take by mouth.    . Omega-3 Fatty Acids (FISH OIL PO) Take by mouth. 1 tsp daily    . omeprazole (PRILOSEC) 20 MG capsule TAKE 1 CAPSULE (20 MG TOTAL) BY MOUTH DAILY. 30 capsule 11  . pravastatin (PRAVACHOL) 40 MG tablet 1 tab by mouth daily--repeat labs due now 90 tablet 0  . valsartan (DIOVAN) 160 MG tablet TAKE 1 TABLET (160 MG TOTAL) BY MOUTH DAILY. 90 tablet 1   No current facility-administered medications on file prior to visit.   She is allergic to sulfa antibiotics..  Review of Systems Pertinent items are noted in HPI.     Objective:    BP 122/82 mmHg  Pulse 57  Temp(Src) 97.5 F (36.4 C) (Oral)  Wt 167 lb 6.4 oz (75.932 kg)  SpO2 98% General appearance: alert, cooperative, appears stated age and no distress Throat: lips, mucosa, and tongue normal; teeth and gums normal Neck: no adenopathy, supple, symmetrical, trachea midline and thyroid not enlarged, symmetric, no tenderness/mass/nodules Lungs: clear to auscultation bilaterally Heart: S1, S2 normal Extremities: extremities normal, atraumatic, no cyanosis  or edema    Assessment:    Hypertension, normal blood pressure . Evidence of target organ damage: none.    Plan:    Medication: no change. Dietary sodium restriction. Regular aerobic exercise. Follow up: 6 months and as needed.    1.  Hyperlipidemia Check labs - Hepatic function panel - Lipid panel  2. Essential hypertension   - Basic metabolic panel  3. Ocular rosacea Pt requesting to go to [facility] - Ambulatory referral to Ophthalmology  4. Need for pneumococcal vaccination   - Pneumococcal conjugate vaccine 13-valent

## 2014-05-13 NOTE — Progress Notes (Signed)
Pre visit review using our clinic review tool, if applicable. No additional management support is needed unless otherwise documented below in the visit note. 

## 2014-06-16 ENCOUNTER — Other Ambulatory Visit: Payer: Self-pay | Admitting: Family Medicine

## 2014-07-07 ENCOUNTER — Other Ambulatory Visit: Payer: Self-pay | Admitting: Family Medicine

## 2014-07-07 NOTE — Telephone Encounter (Signed)
Last Filled:  07/11/13 Amt:  30, 11 refills Last OV:  05/13/14  Med filled.

## 2014-07-24 DIAGNOSIS — H3531 Nonexudative age-related macular degeneration: Secondary | ICD-10-CM | POA: Diagnosis not present

## 2014-07-24 DIAGNOSIS — H53002 Unspecified amblyopia, left eye: Secondary | ICD-10-CM | POA: Diagnosis not present

## 2014-07-24 DIAGNOSIS — H524 Presbyopia: Secondary | ICD-10-CM | POA: Diagnosis not present

## 2014-08-11 ENCOUNTER — Other Ambulatory Visit: Payer: Self-pay | Admitting: Family Medicine

## 2014-08-20 ENCOUNTER — Other Ambulatory Visit: Payer: Self-pay | Admitting: Family Medicine

## 2014-10-16 ENCOUNTER — Other Ambulatory Visit: Payer: Self-pay | Admitting: Family Medicine

## 2014-11-11 ENCOUNTER — Encounter: Payer: Self-pay | Admitting: Family Medicine

## 2014-11-11 ENCOUNTER — Ambulatory Visit (INDEPENDENT_AMBULATORY_CARE_PROVIDER_SITE_OTHER): Payer: Medicare PPO | Admitting: Family Medicine

## 2014-11-11 VITALS — BP 132/84 | HR 66 | Temp 97.9°F | Wt 160.8 lb

## 2014-11-11 DIAGNOSIS — E785 Hyperlipidemia, unspecified: Secondary | ICD-10-CM

## 2014-11-11 DIAGNOSIS — I10 Essential (primary) hypertension: Secondary | ICD-10-CM | POA: Diagnosis not present

## 2014-11-11 LAB — BASIC METABOLIC PANEL
BUN: 13 mg/dL (ref 6–23)
CO2: 32 mEq/L (ref 19–32)
Calcium: 10.4 mg/dL (ref 8.4–10.5)
Chloride: 104 mEq/L (ref 96–112)
Creatinine, Ser: 0.81 mg/dL (ref 0.40–1.20)
GFR: 71.96 mL/min (ref >60.00)
Glucose, Bld: 100 mg/dL — ABNORMAL HIGH (ref 70–99)
Potassium: 4 mEq/L (ref 3.5–5.1)
Sodium: 141 mEq/L (ref 135–145)

## 2014-11-11 LAB — HEPATIC FUNCTION PANEL
ALT: 20 U/L (ref 0–35)
AST: 22 U/L (ref 0–37)
Albumin: 4.5 g/dL (ref 3.5–5.2)
Alkaline Phosphatase: 54 U/L (ref 39–117)
Bilirubin, Direct: 0.1 mg/dL (ref 0.0–0.3)
Total Bilirubin: 0.7 mg/dL (ref 0.2–1.2)
Total Protein: 6.9 g/dL (ref 6.0–8.3)

## 2014-11-11 LAB — LIPID PANEL
Cholesterol: 157 mg/dL (ref 0–200)
HDL: 65.3 mg/dL (ref >39.00)
LDL Cholesterol: 74 mg/dL (ref 0–99)
NonHDL: 91.7
Total CHOL/HDL Ratio: 2
Triglycerides: 88 mg/dL (ref 0.0–149.0)
VLDL: 17.6 mg/dL (ref 0.0–40.0)

## 2014-11-11 NOTE — Progress Notes (Signed)
Patient ID: Jessica Mullins, female    DOB: [birth year]  Age: 81 y.o. MRN: [id number]    Subjective:  Subjective HPI Jessica Mullins presents for f/u cholesterol and bp  Review of Systems  Constitutional: Negative for diaphoresis, appetite change, fatigue and unexpected weight change.  Eyes: Negative for pain, redness and visual disturbance.  Respiratory: Negative for cough, chest tightness, shortness of breath and wheezing.   Cardiovascular: Negative for chest pain, palpitations and leg swelling.  Endocrine: Negative for cold intolerance, heat intolerance, polydipsia, polyphagia and polyuria.  Genitourinary: Negative for dysuria, frequency and difficulty urinating.  Neurological: Negative for dizziness, light-headedness, numbness and headaches.    History Past Medical History  Diagnosis Date  . Hypertension   . Hyperlipidemia     She has past surgical history that includes Abdominal hysterectomy; Appendectomy; Ganglion cyst excision; and Breast lumpectomy.   Her family history includes Cancer in her mother; Cancer (age of onset: 56) in her sister; Coronary artery disease in her brother; Diabetes in her brother and maternal aunt; Heart disease in her brother; Heart disease (age of onset: 30) in her father; Hyperlipidemia in her brother; Hypertension in her brother; Lung cancer in an other family member; Thrombosis in an other family member.She reports that she has never smoked. She has never used smokeless tobacco. She reports that she drinks alcohol. She reports that she does not use illicit drugs.  Current Outpatient Prescriptions on File Prior to Visit  Medication Sig Dispense Refill  . alendronate (FOSAMAX) 70 MG tablet TAKE 1 TABLET (70 MG TOTAL) BY MOUTH EVERY SEVEN DAYS. TAKE WITH A FULL GLASS OF WATER AT NOON TIME ON EMPTY STOMACH. 4 tablet 11  . Biotin (BIOTIN 5000) 5 MG CAPS Take by mouth.    . Calcium Carbonate-Vitamin D (CALTRATE 600+D) 600-400 MG-UNIT per tablet Take 2  tablets by mouth daily.     . Cholecalciferol (VITAMIN D3) 1000 UNITS CAPS Take 1 capsule by mouth daily.      [name] doxycycline (VIBRAMYCIN) 100 MG capsule Take 100 mg by mouth 2 (two) times daily.     . meloxicam (MOBIC) 7.5 MG tablet TAKE 1 TABLET (7.5 MG TOTAL) BY MOUTH DAILY. 30 tablet 1  . Multiple Vitamins-Minerals (ICAPS MV PO) Take by mouth.    . Omega-3 Fatty Acids (FISH OIL PO) Take by mouth. 1 tsp daily    . omeprazole (PRILOSEC) 20 MG capsule TAKE 1 CAPSULE (20 MG TOTAL) BY MOUTH DAILY. 30 capsule 5  . pravastatin (PRAVACHOL) 40 MG tablet TAKE 1 TABLET (40 MG TOTAL) BY MOUTH AT BEDTIME. 90 tablet 1  . Turmeric 500 MG CAPS Take 1 capsule by mouth 2 (two) times daily.    . valsartan (DIOVAN) 160 MG tablet TAKE 1 TABLET (160 MG TOTAL) BY MOUTH DAILY. 90 tablet 1   No current facility-administered medications on file prior to visit.     Objective:  Objective Physical Exam  Constitutional: She is oriented to person, place, and time. She appears well-developed and well-nourished.  HENT:  Head: Normocephalic and atraumatic.  Eyes: Conjunctivae and EOM are normal.  Neck: Normal range of motion. Neck supple. No JVD present. Carotid bruit is not present. No thyromegaly present.  Cardiovascular: Normal rate, regular rhythm and normal heart sounds.   No murmur heard. Pulmonary/Chest: Effort normal and breath sounds normal. No respiratory distress. She has no wheezes. She has no rales. She exhibits no tenderness.  Musculoskeletal: She exhibits no edema.  Neurological: She is alert and oriented  to person, place, and time.  Psychiatric: She has a normal mood and affect. Her behavior is normal. Thought content normal.   BP 132/84 mmHg  Pulse 66  Temp(Src) 97.9 F (36.6 C) (Oral)  Wt 160 lb 12.8 oz (72.938 kg)  SpO2 97% Wt Readings from Last 3 Encounters:  11/11/14 160 lb 12.8 oz (72.938 kg)  05/13/14 167 lb 6.4 oz (75.932 kg)  04/10/14 166 lb (75.297 kg)     Lab Results  Component  Value Date   WBC 7.1 09/25/2012   HGB 14.9 09/25/2012   HCT 43.2 09/25/2012   PLT 235.0 09/25/2012   GLUCOSE 100* 11/11/2014   CHOL 157 11/11/2014   TRIG 88.0 11/11/2014   HDL 65.30 11/11/2014   LDLDIRECT 153.4 11/16/2009   LDLCALC 74 11/11/2014   ALT 20 11/11/2014   AST 22 11/11/2014   NA 141 11/11/2014   K 4.0 11/11/2014   CL 104 11/11/2014   CREATININE 0.81 11/11/2014   BUN 13 11/11/2014   CO2 32 11/11/2014   TSH 0.62 03/09/2011    Dg Bone Density  10/09/2012   Osteopenia with lowest T score -2.2 at spine  This has decreased from last study    Assessment & Plan:  Plan I have discontinued Jessica Mullins's Polyethyl Glycol-Propyl Glycol. I am also having her maintain her Calcium Carbonate-Vitamin D, Vitamin D3, Omega-3 Fatty Acids (FISH OIL PO), doxycycline, Biotin, Multiple Vitamins-Minerals (ICAPS MV PO), Turmeric, pravastatin, omeprazole, valsartan, alendronate, meloxicam, RESTASIS, and Propylene Glycol.  Meds ordered this encounter  Medications  . RESTASIS 0.05 % ophthalmic emulsion    Sig: Place 1 drop into both eyes 2 (two) times daily.  [name] DISCONTD: Polyethyl Glycol-Propyl Glycol 0.4-0.3 % SOLN    Sig: Apply to eye.  [name] Propylene Glycol 0.6 % SOLN    Sig: Apply 1 drop to eye daily as needed.    Problem List Items Addressed This Visit    Hyperlipidemia - Primary   Relevant Orders   Hepatic function panel (Completed)   Lipid panel (Completed)   Essential hypertension    diovan rto 6 months      Relevant Orders   Basic metabolic panel (Completed)      Follow-up: Return in about 6 months (around 05/14/2015), or if symptoms worsen or fail to improve.  Garnet Koyanagi, DO

## 2014-11-11 NOTE — Assessment & Plan Note (Signed)
diovan rto 6 months

## 2014-11-11 NOTE — Assessment & Plan Note (Signed)
con't pravastatin Check labs

## 2014-11-11 NOTE — Patient Instructions (Signed)

## 2014-11-11 NOTE — Progress Notes (Signed)
Pre visit review using our clinic review tool, if applicable. No additional management support is needed unless otherwise documented below in the visit note. 

## 2014-11-18 ENCOUNTER — Ambulatory Visit (INDEPENDENT_AMBULATORY_CARE_PROVIDER_SITE_OTHER): Payer: Medicare PPO | Admitting: Physician Assistant

## 2014-11-18 ENCOUNTER — Encounter: Payer: Self-pay | Admitting: Physician Assistant

## 2014-11-18 VITALS — BP 150/75 | HR 74 | Temp 97.9°F | Ht 61.0 in | Wt 163.2 lb

## 2014-11-18 DIAGNOSIS — M545 Low back pain, unspecified: Secondary | ICD-10-CM | POA: Insufficient documentation

## 2014-11-18 DIAGNOSIS — R350 Frequency of micturition: Secondary | ICD-10-CM | POA: Diagnosis not present

## 2014-11-18 LAB — POCT URINALYSIS DIPSTICK
Bilirubin, UA: NEGATIVE
Blood, UA: NEGATIVE
Glucose, UA: NEGATIVE
Leukocytes, UA: NEGATIVE
Nitrite, UA: NEGATIVE
Spec Grav, UA: 1.025
Urobilinogen, UA: 0.2
pH, UA: 6

## 2014-11-18 NOTE — Assessment & Plan Note (Signed)
Seems muscular in nature.  Urine testing unremarkable -- think episode of urgency more coincidental.  Do not see any evidence or concern for nerve compression. Patient to resume her Mobic. Avoid heavy lifting or overexertion. Alternate ice/heat to area.  Topical Aspercreme.  Follow-up 1 week.  Return sooner if anything worsens. Alarm signs/symptoms reviewed with patient.

## 2014-11-18 NOTE — Patient Instructions (Addendum)
Your symptoms and exam seem consistent with muscular etiology. Continue your Mobic as directed.  Stay well hydrated. Avoid heavy lifting or overexertion.  Apply Aspercreme to the lower back. Alternate ice/heat to the lower back in 10 minute intervals a few times per day. Your urine is completely normal which is great.   Follow-up in 1 week. If anything worsens or if you note incontinence or numbness of groin, call 911.

## 2014-11-18 NOTE — Progress Notes (Signed)
Patient presents to clinic today c/o bilateral low back pain first noticed upon waking this morning. Patient also endorses one episode of urinary urgency this morning, but denies recurrence, frequency, hematuria, dysuria.  Denies nausea or vomiting. Patient denies trauma or injury to lower back. Pain at present is 5/10 and only present when bending or turning. Denies radiation of pain.  Denies numbness, tingling or weakness.  Endorses good bowel output. Did note she vacuumed "a lot" yesterday before bed.  Past Medical History  Diagnosis Date  . Hypertension   . Hyperlipidemia     Current Outpatient Prescriptions on File Prior to Visit  Medication Sig Dispense Refill  . alendronate (FOSAMAX) 70 MG tablet TAKE 1 TABLET (70 MG TOTAL) BY MOUTH EVERY SEVEN DAYS. TAKE WITH A FULL GLASS OF WATER AT NOON TIME ON EMPTY STOMACH. 4 tablet 11  . Biotin (BIOTIN 5000) 5 MG CAPS Take by mouth.    . Calcium Carbonate-Vitamin D (CALTRATE 600+D) 600-400 MG-UNIT per tablet Take 2 tablets by mouth daily.     . Cholecalciferol (VITAMIN D3) 1000 UNITS CAPS Take 1 capsule by mouth daily.      [name] doxycycline (VIBRAMYCIN) 100 MG capsule Take 100 mg by mouth 2 (two) times daily.     . meloxicam (MOBIC) 7.5 MG tablet TAKE 1 TABLET (7.5 MG TOTAL) BY MOUTH DAILY. 30 tablet 1  . Multiple Vitamins-Minerals (ICAPS MV PO) Take by mouth.    . Omega-3 Fatty Acids (FISH OIL PO) Take by mouth. 1 tsp daily    . omeprazole (PRILOSEC) 20 MG capsule TAKE 1 CAPSULE (20 MG TOTAL) BY MOUTH DAILY. 30 capsule 5  . pravastatin (PRAVACHOL) 40 MG tablet TAKE 1 TABLET (40 MG TOTAL) BY MOUTH AT BEDTIME. 90 tablet 1  . Propylene Glycol 0.6 % SOLN Apply 1 drop to eye daily as needed.    . RESTASIS 0.05 % ophthalmic emulsion Place 1 drop into both eyes 2 (two) times daily.    . Turmeric 500 MG CAPS Take 1 capsule by mouth 2 (two) times daily.    . valsartan (DIOVAN) 160 MG tablet TAKE 1 TABLET (160 MG TOTAL) BY MOUTH DAILY. 90 tablet 1    No current facility-administered medications on file prior to visit.    Allergies  Allergen Reactions  . Sulfa Antibiotics     As a child    Family History  Problem Relation Age of Onset  . Lung cancer    . Thrombosis      thromboembolism clotting disorder--? father died of ? clot   . Cancer Mother     mets to bone--? primary  . Coronary artery disease Brother     died of M! @ 62  . Diabetes Brother   . Hyperlipidemia Brother   . Hypertension Brother   . Heart disease Brother     chf  . Heart disease Father 35    MI  . Cancer Sister 58    lung  . Diabetes Maternal Aunt     History   Social History  . Marital Status: Widowed    Spouse Name: N/A  . Number of Children: N/A  . Years of Education: N/A   Occupational History  . teacher     retired   Social History Main Topics  . Smoking status: Never Smoker   . Smokeless tobacco: Never Used  . Alcohol Use: Yes  . Drug Use: No  . Sexual Activity: Not on file   Other Topics  Concern  . None   Social History Narrative   Review of Systems - See HPI.  All other ROS are negative.  BP 150/75 mmHg  Pulse 74  Temp(Src) 97.9 F (36.6 C) (Oral)  Ht 5\' 1"  (1.549 m)  Wt 163 lb 3.2 oz (74.027 kg)  BMI 30.85 kg/m2  SpO2 98%  Physical Exam  Cardiovascular: Normal rate, regular rhythm, normal heart sounds and intact distal pulses.   Pulmonary/Chest: Effort normal and breath sounds normal. No respiratory distress. She has no wheezes. She has no rales. She exhibits no tenderness.  Abdominal: Soft. Bowel sounds are normal. She exhibits no distension and no mass. There is no tenderness. There is no rebound, no guarding and no CVA tenderness.  Musculoskeletal:       Lumbar back: She exhibits tenderness and pain. She exhibits no bony tenderness and no spasm.  Pain reproducible with lateral bending and flexion of torso.  Neurological: She is alert.  Skin: Skin is warm and dry. No rash noted.  Vitals  reviewed.   Recent Results (from the past 2160 hour(s))  Basic metabolic panel     Status: Abnormal   Collection Time: 11/11/14 11:46 AM  Result Value Ref Range   Sodium 141 135 - 145 mEq/L   Potassium 4.0 3.5 - 5.1 mEq/L   Chloride 104 96 - 112 mEq/L   CO2 32 19 - 32 mEq/L   Glucose, Bld 100 (H) 70 - 99 mg/dL   BUN 13 6 - 23 mg/dL   Creatinine, Ser 0.81 0.40 - 1.20 mg/dL   Calcium 10.4 8.4 - 10.5 mg/dL   GFR 71.96 >60.00 mL/min  Hepatic function panel     Status: None   Collection Time: 11/11/14 11:46 AM  Result Value Ref Range   Total Bilirubin 0.7 0.2 - 1.2 mg/dL   Bilirubin, Direct 0.1 0.0 - 0.3 mg/dL   Alkaline Phosphatase 54 39 - 117 U/L   AST 22 0 - 37 U/L   ALT 20 0 - 35 U/L   Total Protein 6.9 6.0 - 8.3 g/dL   Albumin 4.5 3.5 - 5.2 g/dL  Lipid panel     Status: None   Collection Time: 11/11/14 11:46 AM  Result Value Ref Range   Cholesterol 157 0 - 200 mg/dL    Comment: ATP III Classification       Desirable:  < 200 mg/dL               Borderline High:  200 - 239 mg/dL          High:  > = 240 mg/dL   Triglycerides 88.0 0.0 - 149.0 mg/dL    Comment: Normal:  <150 mg/dLBorderline High:  150 - 199 mg/dL   HDL 65.30 >39.00 mg/dL   VLDL 17.6 0.0 - 40.0 mg/dL   LDL Cholesterol 74 0 - 99 mg/dL   Total CHOL/HDL Ratio 2     Comment:                Men          Women1/2 Average Risk     3.4          3.3Average Risk          5.0          4.42X Average Risk          9.6          7.13X Average Risk  15.0          11.0                       NonHDL 91.70     Comment: NOTE:  Non-HDL goal should be 30 mg/dL higher than patient's LDL goal (i.e. LDL goal of < 70 mg/dL, would have non-HDL goal of < 100 mg/dL)  POCT Urinalysis Dipstick     Status: None   Collection Time: 11/18/14  1:54 PM  Result Value Ref Range   Color, UA yellow    Clarity, UA clear    Glucose, UA neg    Bilirubin, UA neg    Ketones, UA 1+    Spec Grav, UA 1.025    Blood, UA neg    pH, UA 6.0     Protein, UA trace    Urobilinogen, UA 0.2    Nitrite, UA neg    Leukocytes, UA Negative     Assessment/Plan: Bilateral low back pain without sciatica Seems muscular in nature.  Urine testing unremarkable -- think episode of urgency more coincidental.  Do not see any evidence or concern for nerve compression. Patient to resume her Mobic. Avoid heavy lifting or overexertion. Alternate ice/heat to area.  Topical Aspercreme.  Follow-up 1 week.  Return sooner if anything worsens. Alarm signs/symptoms reviewed with patient.

## 2014-11-18 NOTE — Progress Notes (Signed)
Pre visit review using our clinic review tool, if applicable. No additional management support is needed unless otherwise documented below in the visit note. 

## 2014-11-19 LAB — URINE CULTURE: Colony Count: 8000

## 2014-12-11 ENCOUNTER — Other Ambulatory Visit: Payer: Self-pay | Admitting: Family Medicine

## 2014-12-12 ENCOUNTER — Other Ambulatory Visit: Payer: Self-pay | Admitting: Family Medicine

## 2014-12-16 ENCOUNTER — Encounter: Payer: Self-pay | Admitting: Internal Medicine

## 2014-12-20 DIAGNOSIS — I10 Essential (primary) hypertension: Secondary | ICD-10-CM | POA: Diagnosis not present

## 2014-12-20 DIAGNOSIS — E785 Hyperlipidemia, unspecified: Secondary | ICD-10-CM | POA: Diagnosis not present

## 2014-12-20 DIAGNOSIS — Z6832 Body mass index (BMI) 32.0-32.9, adult: Secondary | ICD-10-CM | POA: Diagnosis not present

## 2014-12-20 DIAGNOSIS — M1990 Unspecified osteoarthritis, unspecified site: Secondary | ICD-10-CM | POA: Diagnosis not present

## 2014-12-20 DIAGNOSIS — M810 Age-related osteoporosis without current pathological fracture: Secondary | ICD-10-CM | POA: Diagnosis not present

## 2014-12-20 DIAGNOSIS — E663 Overweight: Secondary | ICD-10-CM | POA: Diagnosis not present

## 2014-12-20 DIAGNOSIS — K219 Gastro-esophageal reflux disease without esophagitis: Secondary | ICD-10-CM | POA: Diagnosis not present

## 2014-12-20 DIAGNOSIS — H547 Unspecified visual loss: Secondary | ICD-10-CM | POA: Diagnosis not present

## 2014-12-26 ENCOUNTER — Other Ambulatory Visit: Payer: Self-pay | Admitting: Family Medicine

## 2015-02-12 DIAGNOSIS — Z950 Presence of cardiac pacemaker: Secondary | ICD-10-CM | POA: Diagnosis not present

## 2015-02-12 DIAGNOSIS — I6522 Occlusion and stenosis of left carotid artery: Secondary | ICD-10-CM | POA: Diagnosis not present

## 2015-02-12 DIAGNOSIS — R42 Dizziness and giddiness: Secondary | ICD-10-CM | POA: Diagnosis not present

## 2015-02-12 DIAGNOSIS — I480 Paroxysmal atrial fibrillation: Secondary | ICD-10-CM | POA: Diagnosis not present

## 2015-02-12 DIAGNOSIS — R55 Syncope and collapse: Secondary | ICD-10-CM | POA: Diagnosis not present

## 2015-02-12 DIAGNOSIS — R072 Precordial pain: Secondary | ICD-10-CM | POA: Diagnosis not present

## 2015-02-12 DIAGNOSIS — I10 Essential (primary) hypertension: Secondary | ICD-10-CM | POA: Diagnosis not present

## 2015-03-09 DIAGNOSIS — H04123 Dry eye syndrome of bilateral lacrimal glands: Secondary | ICD-10-CM | POA: Diagnosis not present

## 2015-03-09 DIAGNOSIS — H018 Other specified inflammations of eyelid: Secondary | ICD-10-CM | POA: Diagnosis not present

## 2015-03-23 ENCOUNTER — Other Ambulatory Visit: Payer: Self-pay | Admitting: Family Medicine

## 2015-04-28 DIAGNOSIS — Z1231 Encounter for screening mammogram for malignant neoplasm of breast: Secondary | ICD-10-CM | POA: Diagnosis not present

## 2015-05-04 DIAGNOSIS — R922 Inconclusive mammogram: Secondary | ICD-10-CM | POA: Diagnosis not present

## 2015-05-04 DIAGNOSIS — R928 Other abnormal and inconclusive findings on diagnostic imaging of breast: Secondary | ICD-10-CM | POA: Diagnosis not present

## 2015-05-04 DIAGNOSIS — Z1231 Encounter for screening mammogram for malignant neoplasm of breast: Secondary | ICD-10-CM | POA: Diagnosis not present

## 2015-05-04 LAB — HM MAMMOGRAPHY

## 2015-05-07 ENCOUNTER — Encounter: Payer: Self-pay | Admitting: Family Medicine

## 2015-05-07 ENCOUNTER — Telehealth: Payer: Self-pay

## 2015-05-07 NOTE — Telephone Encounter (Signed)
AWV

## 2015-05-11 DIAGNOSIS — H04123 Dry eye syndrome of bilateral lacrimal glands: Secondary | ICD-10-CM | POA: Diagnosis not present

## 2015-05-11 DIAGNOSIS — H018 Other specified inflammations of eyelid: Secondary | ICD-10-CM | POA: Diagnosis not present

## 2015-05-11 DIAGNOSIS — L718 Other rosacea: Secondary | ICD-10-CM | POA: Diagnosis not present

## 2015-05-12 ENCOUNTER — Other Ambulatory Visit: Payer: Self-pay | Admitting: Radiology

## 2015-05-12 DIAGNOSIS — R921 Mammographic calcification found on diagnostic imaging of breast: Secondary | ICD-10-CM | POA: Diagnosis not present

## 2015-05-12 DIAGNOSIS — R922 Inconclusive mammogram: Secondary | ICD-10-CM | POA: Diagnosis not present

## 2015-05-12 DIAGNOSIS — D0592 Unspecified type of carcinoma in situ of left breast: Secondary | ICD-10-CM | POA: Diagnosis not present

## 2015-05-12 DIAGNOSIS — R928 Other abnormal and inconclusive findings on diagnostic imaging of breast: Secondary | ICD-10-CM | POA: Diagnosis not present

## 2015-05-12 DIAGNOSIS — C50912 Malignant neoplasm of unspecified site of left female breast: Secondary | ICD-10-CM | POA: Diagnosis not present

## 2015-05-12 DIAGNOSIS — Z1231 Encounter for screening mammogram for malignant neoplasm of breast: Secondary | ICD-10-CM | POA: Diagnosis not present

## 2015-05-12 DIAGNOSIS — Z0000 Encounter for general adult medical examination without abnormal findings: Secondary | ICD-10-CM | POA: Diagnosis not present

## 2015-05-15 ENCOUNTER — Ambulatory Visit (INDEPENDENT_AMBULATORY_CARE_PROVIDER_SITE_OTHER): Payer: Medicare PPO | Admitting: Family Medicine

## 2015-05-15 ENCOUNTER — Encounter: Payer: Self-pay | Admitting: Family Medicine

## 2015-05-15 VITALS — BP 124/82 | HR 65 | Temp 98.1°F | Wt 160.4 lb

## 2015-05-15 DIAGNOSIS — I10 Essential (primary) hypertension: Secondary | ICD-10-CM | POA: Diagnosis not present

## 2015-05-15 LAB — COMPREHENSIVE METABOLIC PANEL
ALT: 20 U/L (ref 0–35)
AST: 22 U/L (ref 0–37)
Albumin: 4.2 g/dL (ref 3.5–5.2)
Alkaline Phosphatase: 56 U/L (ref 39–117)
BUN: 14 mg/dL (ref 6–23)
CO2: 31 mEq/L (ref 19–32)
Calcium: 10 mg/dL (ref 8.4–10.5)
Chloride: 105 mEq/L (ref 96–112)
Creatinine, Ser: 0.83 mg/dL (ref 0.40–1.20)
GFR: 69.88 mL/min (ref >60.00)
Glucose, Bld: 97 mg/dL (ref 70–99)
Potassium: 4.5 mEq/L (ref 3.5–5.1)
Sodium: 142 mEq/L (ref 135–145)
Total Bilirubin: 0.7 mg/dL (ref 0.2–1.2)
Total Protein: 6.7 g/dL (ref 6.0–8.3)

## 2015-05-15 LAB — LIPID PANEL
Cholesterol: 140 mg/dL (ref 0–200)
HDL: 59.1 mg/dL (ref >39.00)
LDL Cholesterol: 65 mg/dL (ref 0–99)
NonHDL: 80.73
Total CHOL/HDL Ratio: 2
Triglycerides: 81 mg/dL (ref 0.0–149.0)
VLDL: 16.2 mg/dL (ref 0.0–40.0)

## 2015-05-15 NOTE — Assessment & Plan Note (Signed)
diovan daily Stable rto 6 months

## 2015-05-15 NOTE — Patient Instructions (Signed)
Hypertension Hypertension, commonly called high blood pressure, is when the force of blood pumping through your arteries is too strong. Your arteries are the blood vessels that carry blood from your heart throughout your body. A blood pressure reading consists of a higher number over a lower number, such as 110/72. The higher number (systolic) is the pressure inside your arteries when your heart pumps. The lower number (diastolic) is the pressure inside your arteries when your heart relaxes. Ideally you want your blood pressure below 120/80. Hypertension forces your heart to work harder to pump blood. Your arteries may become narrow or stiff. Having untreated or uncontrolled hypertension can cause heart attack, stroke, kidney disease, and other problems. RISK FACTORS Some risk factors for high blood pressure are controllable. Others are not.  Risk factors you cannot control include:   Race. You may be at higher risk if you are African American.  Age. Risk increases with age.  Gender. Men are at higher risk than women before age 45 years. After age 65, women are at higher risk than men. Risk factors you can control include:  Not getting enough exercise or physical activity.  Being overweight.  Getting too much fat, sugar, calories, or salt in your diet.  Drinking too much alcohol. SIGNS AND SYMPTOMS Hypertension does not usually cause signs or symptoms. Extremely high blood pressure (hypertensive crisis) may cause headache, anxiety, shortness of breath, and nosebleed. DIAGNOSIS To check if you have hypertension, your health care provider will measure your blood pressure while you are seated, with your arm held at the level of your heart. It should be measured at least twice using the same arm. Certain conditions can cause a difference in blood pressure between your right and left arms. A blood pressure reading that is higher than normal on one occasion does not mean that you need treatment. If  it is not clear whether you have high blood pressure, you may be asked to return on a different day to have your blood pressure checked again. Or, you may be asked to monitor your blood pressure at home for 1 or more weeks. TREATMENT Treating high blood pressure includes making lifestyle changes and possibly taking medicine. Living a healthy lifestyle can help lower high blood pressure. You may need to change some of your habits. Lifestyle changes may include:  Following the DASH diet. This diet is high in fruits, vegetables, and whole grains. It is low in salt, red meat, and added sugars.  Keep your sodium intake below 2,300 mg per day.  Getting at least 30-45 minutes of aerobic exercise at least 4 times per week.  Losing weight if necessary.  Not smoking.  Limiting alcoholic beverages.  Learning ways to reduce stress. Your health care provider may prescribe medicine if lifestyle changes are not enough to get your blood pressure under control, and if one of the following is true:  You are 18-59 years of age and your systolic blood pressure is above 140.  You are 60 years of age or older, and your systolic blood pressure is above 150.  Your diastolic blood pressure is above 90.  You have diabetes, and your systolic blood pressure is over 140 or your diastolic blood pressure is over 90.  You have kidney disease and your blood pressure is above 140/90.  You have heart disease and your blood pressure is above 140/90. Your personal target blood pressure may vary depending on your medical conditions, your age, and other factors. HOME CARE INSTRUCTIONS    Have your blood pressure rechecked as directed by your health care provider.   Take medicines only as directed by your health care provider. Follow the directions carefully. Blood pressure medicines must be taken as prescribed. The medicine does not work as well when you skip doses. Skipping doses also puts you at risk for  problems.  Do not smoke.   Monitor your blood pressure at home as directed by your health care provider. SEEK MEDICAL CARE IF:   You think you are having a reaction to medicines taken.  You have recurrent headaches or feel dizzy.  You have swelling in your ankles.  You have trouble with your vision. SEEK IMMEDIATE MEDICAL CARE IF:  You develop a severe headache or confusion.  You have unusual weakness, numbness, or feel faint.  You have severe chest or abdominal pain.  You vomit repeatedly.  You have trouble breathing. MAKE SURE YOU:   Understand these instructions.  Will watch your condition.  Will get help right away if you are not doing well or get worse.   This information is not intended to replace advice given to you by your health care provider. Make sure you discuss any questions you have with your health care provider.   Document Released: 06/13/2005 Document Revised: 10/28/2014 Document Reviewed: 04/05/2013 Elsevier Interactive Patient Education 2016 [employer]  

## 2015-05-15 NOTE — Progress Notes (Signed)
Patient ID: Jessica Mullins, female    DOB: 11-Nov-1930  Age: 81 y.o. MRN: [id number]    Subjective:  Subjective HPI Jessica Mullins presents for f/u bp.  No complaints.       Review of Systems  Constitutional: Negative for diaphoresis, appetite change, fatigue and unexpected weight change.  Eyes: Negative for pain, redness and visual disturbance.  Respiratory: Negative for cough, chest tightness, shortness of breath and wheezing.   Cardiovascular: Negative for chest pain, palpitations and leg swelling.  Endocrine: Negative for cold intolerance, heat intolerance, polydipsia, polyphagia and polyuria.  Genitourinary: Negative for dysuria, frequency and difficulty urinating.  Neurological: Negative for dizziness, light-headedness, numbness and headaches.    History Past Medical History  Diagnosis Date  . Hypertension   . Hyperlipidemia     She has past surgical history that includes Abdominal hysterectomy; Appendectomy; Ganglion cyst excision; and Breast lumpectomy.   Her family history includes Cancer in her mother; Cancer (age of onset: 10) in her sister; Coronary artery disease in her brother; Diabetes in her brother and maternal aunt; Heart disease in her brother; Heart disease (age of onset: 53) in her father; Hyperlipidemia in her brother; Hypertension in her brother; Lung cancer in an other family member; Thrombosis in an other family member.She reports that she has never smoked. She has never used smokeless tobacco. She reports that she drinks alcohol. She reports that she does not use illicit drugs.  Current Outpatient Prescriptions on File Prior to Visit  Medication Sig Dispense Refill  . alendronate (FOSAMAX) 70 MG tablet TAKE 1 TABLET (70 MG TOTAL) BY MOUTH EVERY SEVEN DAYS. TAKE WITH A FULL GLASS OF WATER AT NOON TIME ON EMPTY STOMACH. 4 tablet 11  . Biotin (BIOTIN 5000) 5 MG CAPS Take by mouth.    . Calcium Carbonate-Vitamin D (CALTRATE 600+D) 600-400 MG-UNIT per tablet  Take 2 tablets by mouth daily.     . Cholecalciferol (VITAMIN D3) 1000 UNITS CAPS Take 1 capsule by mouth daily.      . meloxicam (MOBIC) 7.5 MG tablet TAKE 1 TABLET (7.5 MG TOTAL) BY MOUTH DAILY. 30 tablet 0  . Multiple Vitamins-Minerals (ICAPS MV PO) Take by mouth.    . Omega-3 Fatty Acids (FISH OIL PO) Take by mouth. 1 tsp daily    . omeprazole (PRILOSEC) 20 MG capsule TAKE 1 CAPSULE (20 MG TOTAL) BY MOUTH DAILY. 30 capsule 11  . pravastatin (PRAVACHOL) 40 MG tablet TAKE 1 TABLET (40 MG TOTAL) BY MOUTH AT BEDTIME. 90 tablet 0  . Propylene Glycol 0.6 % SOLN Apply 1 drop to eye daily as needed.    . RESTASIS 0.05 % ophthalmic emulsion Place 1 drop into both eyes 2 (two) times daily.    . Turmeric 500 MG CAPS Take 1 capsule by mouth 2 (two) times daily.    . valsartan (DIOVAN) 160 MG tablet TAKE 1 TABLET (160 MG TOTAL) BY MOUTH DAILY. 90 tablet 1   No current facility-administered medications on file prior to visit.     Objective:  Objective Physical Exam  Constitutional: She is oriented to person, place, and time. She appears well-developed and well-nourished.  HENT:  Head: Normocephalic and atraumatic.  Eyes: Conjunctivae and EOM are normal.  Neck: Normal range of motion. Neck supple. No JVD present. Carotid bruit is not present. No thyromegaly present.  Cardiovascular: Normal rate, regular rhythm and normal heart sounds.   No murmur heard. Pulmonary/Chest: Effort normal and breath sounds normal. No respiratory distress. She has  no wheezes. She has no rales. She exhibits no tenderness.  Musculoskeletal: She exhibits no edema.  Neurological: She is alert and oriented to person, place, and time.  Psychiatric: She has a normal mood and affect.  Nursing note and vitals reviewed.  BP 124/82 mmHg  Pulse 65  Temp(Src) 98.1 F (36.7 C) (Oral)  Wt 160 lb 6.4 oz (72.757 kg)  SpO2 97% Wt Readings from Last 3 Encounters:  05/15/15 160 lb 6.4 oz (72.757 kg)  11/18/14 163 lb 3.2 oz (74.027  kg)  11/11/14 160 lb 12.8 oz (72.938 kg)     Lab Results  Component Value Date   WBC 7.1 09/25/2012   HGB 14.9 09/25/2012   HCT 43.2 09/25/2012   PLT 235.0 09/25/2012   GLUCOSE 97 05/15/2015   CHOL 140 05/15/2015   TRIG 81.0 05/15/2015   HDL 59.10 05/15/2015   LDLDIRECT 153.4 11/16/2009   LDLCALC 65 05/15/2015   ALT 20 05/15/2015   AST 22 05/15/2015   NA 142 05/15/2015   K 4.5 05/15/2015   CL 105 05/15/2015   CREATININE 0.83 05/15/2015   BUN 14 05/15/2015   CO2 31 05/15/2015   TSH 0.62 03/09/2011    Dg Bone Density  10/09/2012  Osteopenia with lowest T score -2.2 at spine This has decreased from last study    Assessment & Plan:  Plan I have discontinued Jessica Mullins's doxycycline. I am also having her maintain her Calcium Carbonate-Vitamin D, Vitamin D3, Omega-3 Fatty Acids (FISH OIL PO), Biotin, Multiple Vitamins-Minerals (ICAPS MV PO), Turmeric, alendronate, RESTASIS, Propylene Glycol, valsartan, omeprazole, meloxicam, and pravastatin.  No orders of the defined types were placed in this encounter.    Problem List Items Addressed This Visit    Essential hypertension - Primary    diovan daily Stable rto 6 months      Relevant Orders   Comp Met (CMET) (Completed)   Lipid panel (Completed)      Follow-up: Return in about 6 months (around 11/12/2015), or if symptoms worsen or fail to improve, for hypertension.  Garnet Koyanagi, DO

## 2015-05-15 NOTE — Progress Notes (Signed)
Pre visit review using our clinic review tool, if applicable. No additional management support is needed unless otherwise documented below in the visit note. 

## 2015-05-19 DIAGNOSIS — D0590 Unspecified type of carcinoma in situ of unspecified breast: Secondary | ICD-10-CM | POA: Insufficient documentation

## 2015-05-20 ENCOUNTER — Other Ambulatory Visit: Payer: Self-pay | Admitting: Family Medicine

## 2015-05-25 ENCOUNTER — Ambulatory Visit: Payer: Self-pay | Admitting: Surgery

## 2015-05-25 DIAGNOSIS — C50912 Malignant neoplasm of unspecified site of left female breast: Secondary | ICD-10-CM | POA: Diagnosis not present

## 2015-05-25 NOTE — H&P (Signed)
Jessica Mullins 05/25/2015 9:34 AM Location: [facility] Patient #: [id number] DOB: Nov 22, 1930 Married / Language: English / Race: White Female  History of Present Illness [name] A. Kaius Daino MD; 05/25/2015 12:17 PM) Patient words: breast cancer left Patient sent request of Dr. Marcelo Baldy for left breast cancer. She underwent her screening mammogram this year and a 7 mm abnormality was noted in the left breast upper outer quadrant. Core biopsy showed invasive mammary carcinoma. This was triple negative. She thinks her mother had breast cancer but is not sure. Denies any history of breast pain, breast mass or nipple discharge. Has a personal history of melanoma which is stable.  The patient is a 81 year old female.   Other Problems [name], CMA; 05/25/2015 9:34 AM) Arthritis Back Pain Breast Cancer Gastroesophageal Reflux Disease High blood pressure Hypercholesterolemia Melanoma Oophorectomy Bilateral. Other disease, cancer, significant illness  Past Surgical History [name], [place]; 05/25/2015 9:34 AM) Breast Biopsy Left. Cataract Surgery Bilateral. Hysterectomy (not due to cancer) - Complete  Diagnostic Studies History [name], [name]; 05/25/2015 9:34 AM) Colonoscopy 5-10 years ago Mammogram within last year  Allergies [name], [place]; 05/25/2015 9:35 AM) [name] Health Aware *MEDICAL DEVICES AND SUPPLIES* Sulfa 10 *OPHTHALMIC AGENTS*  Medication History [name], CMA; 05/25/2015 9:38 AM) Valsartan (160MG  Tablet, Oral) Active. Restasis (0.05% Emulsion, Ophthalmic) Active. Pravastatin Sodium (40MG  Tablet, Oral) Active. Omeprazole (20MG  Capsule DR, Oral) Active. Meloxicam (7.5MG  Tablet, Oral) Active. Alendronate Sodium (70MG  Tablet, Oral) Active. Turmeric (500MG  Capsule, Oral) Active. Propylene Glycol (Otic) (Otic) Active. Fish Oil Active. Multi Vitamin Daily (Oral)  Active. Vitamin D3 (1000UNIT Tablet, Oral) Active. Caltrate 600 (1500 (600 Ca)MG Tablet, Oral) Active. Biotin (5MG  Tablet, Oral) Active.  Social History [name], [name]; 05/25/2015 9:34 AM) Alcohol use Occasional alcohol use. Caffeine use Coffee, Tea. No drug use Tobacco use Never smoker.  Family History [name], [name]; 05/25/2015 9:34 AM) Alcohol Abuse Son. Arthritis Daughter. Breast Cancer Mother. Depression Daughter, Son. Diabetes Mellitus Brother. Heart Disease Brother, Father. Hypertension Brother, Son. Migraine Headache Daughter. Respiratory Condition Sister. Thyroid problems Daughter.  Pregnancy / Birth History [name], [name]; 05/25/2015 9:34 AM) Age at menarche 28 years. Age of menopause 51-55 Contraceptive History Contraceptive implant. Gravida 2 Maternal age 64-25 Para 2     Review of Systems (Screven. Brooks CMA; 05/25/2015 9:35 AM) General Not Present- Appetite Loss, Chills, Fatigue, Fever, Night Sweats, Weight Gain and Weight Loss. Skin Not Present- Change in Wart/Mole, Dryness, Hives, Jaundice, New Lesions, Non-Healing Wounds, Rash and Ulcer. HEENT Present- Seasonal Allergies, Visual Disturbances and Wears glasses/contact lenses. Not Present- Earache, Hearing Loss, Hoarseness, Nose Bleed, Oral Ulcers, Ringing in the Ears, Sinus Pain, Sore Throat and Yellow Eyes. Respiratory Present- Snoring. Not Present- Bloody sputum, Chronic Cough, Difficulty Breathing and Wheezing. Breast Present- Breast Mass. Not Present- Breast Pain, Nipple Discharge and Skin Changes. Cardiovascular Not Present- Chest Pain, Difficulty Breathing Lying Down, Leg Cramps, Palpitations, Rapid Heart Rate, Shortness of Breath and Swelling of Extremities. Gastrointestinal Present- Constipation. Not Present- Abdominal Pain, Bloating, Bloody Stool, Change in Bowel Habits, Chronic diarrhea, Difficulty Swallowing, Excessive gas, Gets full quickly at  meals, Hemorrhoids, Indigestion, Nausea, Rectal Pain and Vomiting. Female Genitourinary Present- Frequency. Not Present- Nocturia, Painful Urination, Pelvic Pain and Urgency. Musculoskeletal Present- Back Pain. Not Present- Joint Pain, Joint Stiffness, Muscle Pain, Muscle Weakness and Swelling of Extremities. Neurological Present- Trouble walking. Not Present- Decreased Memory, Fainting, Headaches, Numbness, Seizures, Tingling, Tremor and Weakness. Psychiatric  Not Present- Anxiety, Bipolar, Change in Sleep Pattern, Depression, Fearful and Frequent crying. Endocrine Not Present- Cold Intolerance, Excessive Hunger, Hair Changes, Heat Intolerance, Hot flashes and New Diabetes. Hematology Present- Easy Bruising. Not Present- Excessive bleeding, Gland problems, HIV and Persistent Infections.  Vitals [employer] R. Brooks CMA; 05/25/2015 9:34 AM) 05/25/2015 9:34 AM Weight: 163.25 lb Height: 60in Body Surface Area: 1.71 m Body Mass Index: 31.88 kg/m  BP: 140/90 (Sitting, Left Arm, Standard)      Physical Exam (Swade Shonka A. Allisa Einspahr MD; 05/25/2015 12:17 PM)  General Mental Status-Alert. General Appearance-Consistent with stated age. Hydration-Well hydrated. Voice-Normal.  Head and Neck Head-normocephalic, atraumatic with no lesions or palpable masses. Trachea-midline. Thyroid Gland Characteristics - normal size and consistency.  Eye Eyeball - Bilateral-Extraocular movements intact. Sclera/Conjunctiva - Bilateral-No scleral icterus.  Chest and Lung Exam Chest and lung exam reveals -quiet, even and easy respiratory effort with no use of accessory muscles and on auscultation, normal breath sounds, no adventitious sounds and normal vocal resonance. Inspection Chest Wall - Normal. Back - normal.  Breast Breast - Left-Symmetric, Non Tender, No Biopsy scars, no Dimpling, No Inflammation, No Lumpectomy scars, No Mastectomy scars, No Peau d' Orange. Breast -  Right-Symmetric, Non Tender, No Biopsy scars, no Dimpling, No Inflammation, No Lumpectomy scars, No Mastectomy scars, No Peau d' Orange. Breast Lump-No Palpable Breast Mass.  Cardiovascular Cardiovascular examination reveals -normal heart sounds, regular rate and rhythm with no murmurs and normal pedal pulses bilaterally.  Abdomen Inspection Inspection of the abdomen reveals - No Hernias. Skin - Scar - no surgical scars. Palpation/Percussion Palpation and Percussion of the abdomen reveal - Soft, Non Tender, No Rebound tenderness, No Rigidity (guarding) and No hepatosplenomegaly. Auscultation Auscultation of the abdomen reveals - Bowel sounds normal.  Neurologic Neurologic evaluation reveals -alert and oriented x 3 with no impairment of recent or remote memory. Mental Status-Normal.  Musculoskeletal Normal Exam - Left-Upper Extremity Strength Normal and Lower Extremity Strength Normal. Normal Exam - Right-Upper Extremity Strength Normal and Lower Extremity Strength Normal.  Lymphatic Head & Neck  General Head & Neck Lymphatics: Bilateral - Description - Normal. Axillary  General Axillary Region: Bilateral - Description - Normal. Tenderness - Non Tender. Femoral & Inguinal - Did not examine.    Assessment & Plan (Khyrin Trevathan A. Jakie Debow MD; 05/25/2015 12:28 PM)  BREAST CANCER, LEFT (C50.912) Impression: Patient has small left breast cancer amendable to breast conservation therapy. She is triple negative and therefore we'll have her see medical oncology and radiation oncology preop. We'll plan a left breast lumpectomy with a sentinel lymph node mapping and less oncology feels this is not useful to them. Risk of lumpectomy include bleeding, infection, seroma, more surgery, use of seed/wire, wound care, cosmetic deformity and the need for other treatments, death , blood clots, death. Pt agrees to proceed. Risk of sentinel lymph node mapping include bleeding, infection,  lymphedema, shoulder pain. stiffness, dye allergy. cosmetic deformity , blood clots, death, need for more surgery. Pt agres to proceed.  Current Plans Referred to Genetic Counseling, for evaluation and follow up (Medical Genetics). Routine. Referred to Oncology, for evaluation and follow up (Oncology). Routine. Referred to Radiation Oncology, for evaluation and follow up (Radiation Oncology). Routine. Referred to Physical Therapy, for evaluation and follow up (Physical Therapy). Routine. You are being scheduled for surgery - Our schedulers will call you.  You should hear from our office's scheduling department within 5 working days about the location, date, and time of surgery. We try to make accommodations for  patient's preferences in scheduling surgery, but sometimes the OR schedule or the surgeon's schedule prevents [place] from making those accommodations.  If you have not heard from our office [phone]) in 5 working days, call the office and ask for your surgeon's nurse.  If you have other questions about your diagnosis, plan, or surgery, call the office and ask for your surgeon's nurse.  Pt Education - [facility] Breast Cancer Information Given - [facility] "Breast Journey" Package We discussed the staging and pathophysiology of breast cancer. We discussed all of the different options for treatment for breast cancer including surgery, chemotherapy, radiation therapy, Herceptin, and antiestrogen therapy. We discussed a sentinel lymph node biopsy as she does not appear to having lymph node involvement right now. We discussed the performance of that with injection of radioactive tracer and blue dye. We discussed that she would have an incision underneath her axillary hairline. We discussed that there is a bout a 10-20% chance of having a positive node with a sentinel lymph node biopsy and we will await the permanent pathology to make any other first further decisions in terms of her treatment. One of  these options might be to return to the operating room to perform an axillary lymph node dissection. We discussed about a 1-2% risk lifetime of chronic shoulder pain as well as lymphedema associated with a sentinel lymph node biopsy. We discussed the options for treatment of the breast cancer which included lumpectomy versus a mastectomy. We discussed the performance of the lumpectomy with a wire placement. We discussed a 10-20% chance of a positive margin requiring reexcision in the operating room. We also discussed that she may need radiation therapy or antiestrogen therapy or both if she undergoes lumpectomy. We discussed the mastectomy and the postoperative care for that as well. We discussed that there is no difference in her survival whether she undergoes lumpectomy with radiation therapy or antiestrogen therapy versus a mastectomy. There is a slight difference in the local recurrence rate being 3-5% with lumpectomy and about 1% with a mastectomy. We discussed the risks of operation including bleeding, infection, possible reoperation. She understands her further therapy will be based on what her stages at the time of her operation.  Pt Education - [facility] breast cancer surgery: discussed with patient and provided information. Pt Education - ABC (After Breast Cancer) Class Info: discussed with patient and provided information.

## 2015-05-26 ENCOUNTER — Telehealth: Payer: Self-pay | Admitting: Hematology and Oncology

## 2015-05-26 ENCOUNTER — Encounter: Payer: Self-pay | Admitting: Family Medicine

## 2015-05-26 ENCOUNTER — Telehealth: Payer: Self-pay | Admitting: Genetic Counselor

## 2015-05-26 ENCOUNTER — Telehealth: Payer: Self-pay | Admitting: *Deleted

## 2015-05-26 NOTE — Telephone Encounter (Signed)
Called to ask about how long the genetic counseling appt will take.  Discussed that it would take approx 1 hour, followed by a 15 min blood draw, if we choose to order genetic testing.  She asked if she should bring her daughter with her, and I said she definitely can if she would like to.  Verified that we are located on [address].  She knows to arrive about 20 mins early to check in.  Also let her know that having as much family history information as possible is helpful.  She has my direct office number if she has any questions until then.

## 2015-05-26 NOTE — Telephone Encounter (Signed)
Mailed before appt letter, calendar, welcoming packet & intake form to pt. 

## 2015-05-26 NOTE — Telephone Encounter (Signed)
New Breast Appt-s/w patient and gave np appt for 12/05 @ 12:30 w/Dr. Lindi Adie Referring Dr. Brantley Stage

## 2015-06-01 ENCOUNTER — Ambulatory Visit (HOSPITAL_BASED_OUTPATIENT_CLINIC_OR_DEPARTMENT_OTHER): Payer: Medicare PPO | Admitting: Hematology and Oncology

## 2015-06-01 ENCOUNTER — Encounter: Payer: Self-pay | Admitting: Hematology and Oncology

## 2015-06-01 VITALS — BP 143/72 | HR 82 | Temp 97.3°F | Resp 18 | Ht 60.0 in | Wt 164.2 lb

## 2015-06-01 DIAGNOSIS — C50112 Malignant neoplasm of central portion of left female breast: Secondary | ICD-10-CM | POA: Diagnosis not present

## 2015-06-01 DIAGNOSIS — Z171 Estrogen receptor negative status [ER-]: Secondary | ICD-10-CM

## 2015-06-01 NOTE — Addendum Note (Signed)
Addended by: Prentiss Bells on: 06/01/2015 06:14 PM   Modules accepted: Medications

## 2015-06-01 NOTE — Progress Notes (Signed)
[place] NOTE  Patient Care Team: Rosalita Chessman, DO as PCP - General Nicholas Lose, MD as Consulting Physician (Hematology and Oncology) Erroll Luna, MD as Consulting Physician (General Surgery)  CHIEF COMPLAINTS/PURPOSE OF CONSULTATION:  Newly diagnosed breast cancer  HISTORY OF PRESENTING ILLNESS:  Jessica Mullins 81 y.o. female is here because of recent diagnosis of  Left breast cancer. She had a routine screening mammogram that revealed an abnormality. She tells me that a year ago she had a mammogram that suggested an abnormality. She had an ultrasound but was felt to be benign. This time she also underwent ultrasound clinical biopsy which showed invasive ductal carcinoma with DCIS that was triple negative. She was referred to [place] for discussion regarding treatment options. She is here accompanied by her friend. She has seen Dr. Brantley Stage from surgery.  I reviewed her records extensively and collaborated the history with the patient.  SUMMARY OF ONCOLOGIC HISTORY:   Cancer of central portion of left female breast ([place])   05/04/2015 Mammogram  left breast distortion , breast density category A , 7 mm by ultrasound at 3:00 position middle depth   05/12/2015 Initial Diagnosis  left breast biopsy: invasive ductal cancer with DCIS , ER 0%, PR 0%, Ki-67 5%, HER-2 negative ratio 0.83    In terms of breast cancer risk profile:  She menarched at early age of  66 and went to menopause at age  44  She had  2 pregnancy, her first child was born at age  31  She  has received birth control pills for approximately  5 years.  She was never exposed to fertility medications or hormone replacement therapy.  She has  no family history of Breast/GYN/GI cancer  Although her mother had unknown cancer with bone metastases.  MEDICAL HISTORY:  Past Medical History  Diagnosis Date  . Hypertension   . Hyperlipidemia     SURGICAL HISTORY: Past Surgical History  Procedure  Laterality Date  . Abdominal hysterectomy      TAH/BSO--FIBROIDS  . Appendectomy    . Ganglion cyst excision      L  hand  . Breast lumpectomy      B/L--[facility]    SOCIAL HISTORY: Social History   Social History  . Marital Status: Widowed    Spouse Name: N/A  . Number of Children: N/A  . Years of Education: N/A   Occupational History  . teacher     retired   Social History Main Topics  . Smoking status: Never Smoker   . Smokeless tobacco: Never Used  . Alcohol Use: Yes  . Drug Use: No  . Sexual Activity: Not on file   Other Topics Concern  . Not on file   Social History Narrative    FAMILY HISTORY: Family History  Problem Relation Age of Onset  . Lung cancer    . Thrombosis      thromboembolism clotting disorder--? father died of ? clot   . Cancer Mother     mets to bone--? primary  . Coronary artery disease Brother     died of M! @ 17  . Diabetes Brother   . Hyperlipidemia Brother   . Hypertension Brother   . Heart disease Brother     chf  . Heart disease Father 88    MI  . Cancer Sister 12    lung  . Diabetes Maternal Aunt     ALLERGIES:  is allergic to sulfa antibiotics.  MEDICATIONS:  Current Outpatient Prescriptions  Medication Sig Dispense Refill  . alendronate (FOSAMAX) 70 MG tablet TAKE 1 TABLET (70 MG TOTAL) BY MOUTH EVERY SEVEN DAYS. TAKE WITH A FULL GLASS OF WATER AT NOON TIME ON EMPTY STOMACH. 4 tablet 11  . Biotin (BIOTIN 5000) 5 MG CAPS Take by mouth.    . Calcium Carbonate-Vitamin D (CALTRATE 600+D) 600-400 MG-UNIT per tablet Take 2 tablets by mouth daily.     . Cholecalciferol (VITAMIN D3) 1000 UNITS CAPS Take 1 capsule by mouth daily.      . meloxicam (MOBIC) 7.5 MG tablet TAKE 1 TABLET (7.5 MG TOTAL) BY MOUTH DAILY. 30 tablet 1  . Multiple Vitamins-Minerals (ICAPS MV PO) Take by mouth.    . Omega-3 Fatty Acids (FISH OIL PO) Take by mouth. 1 tsp daily    . omeprazole (PRILOSEC) 20 MG capsule TAKE 1 CAPSULE (20 MG TOTAL) BY MOUTH  DAILY. 30 capsule 11  . pravastatin (PRAVACHOL) 40 MG tablet TAKE 1 TABLET (40 MG TOTAL) BY MOUTH AT BEDTIME. 90 tablet 0  . Propylene Glycol 0.6 % SOLN Apply 1 drop to eye daily as needed.    . RESTASIS 0.05 % ophthalmic emulsion Place 1 drop into both eyes 2 (two) times daily.    . Turmeric 500 MG CAPS Take 1 capsule by mouth 2 (two) times daily.    . valsartan (DIOVAN) 160 MG tablet TAKE 1 TABLET (160 MG TOTAL) BY MOUTH DAILY. 90 tablet 1   No current facility-administered medications for this visit.    REVIEW OF SYSTEMS:   Constitutional: Denies fevers, chills or abnormal night sweats Eyes: Denies blurriness of vision, double vision or watery eyes Ears, nose, mouth, throat, and face: Denies mucositis or sore throat Respiratory: Denies cough, dyspnea or wheezes Cardiovascular: Denies palpitation, chest discomfort or lower extremity swelling Gastrointestinal:  Denies nausea, heartburn or change in bowel habits Skin: Denies abnormal skin rashes Lymphatics: Denies new lymphadenopathy or easy bruising Neurological:Denies numbness, tingling or new weaknesses Behavioral/Psych: Mood is stable, no new changes  Breast:  Denies any palpable lumps or discharge All other systems were reviewed with the patient and are negative.  PHYSICAL EXAMINATION: ECOG PERFORMANCE STATUS: 0 - Asymptomatic  Filed Vitals:   06/01/15 1239  BP: 143/72  Pulse: 82  Temp: 97.3 F (36.3 C)  Resp: 18   Filed Weights   06/01/15 1239  Weight: 164 lb 3.2 oz (74.481 kg)    GENERAL:alert, no distress and comfortable SKIN: skin color, texture, turgor are normal, no rashes or significant lesions EYES: normal, conjunctiva are pink and non-injected, sclera clear OROPHARYNX:no exudate, no erythema and lips, buccal mucosa, and tongue normal  NECK: supple, thyroid normal size, non-tender, without nodularity LYMPH:  no palpable lymphadenopathy in the cervical, axillary or inguinal LUNGS: clear to auscultation and  percussion with normal breathing effort HEART: regular rate & rhythm and no murmurs and no lower extremity edema ABDOMEN:abdomen soft, non-tender and normal bowel sounds Musculoskeletal:no cyanosis of digits and no clubbing  PSYCH: alert & oriented x 3 with fluent speech NEURO: no focal motor/sensory deficits BREAST: No palpable nodules in breast. No palpable axillary or supraclavicular lymphadenopathy (exam performed in the presence of a chaperone)   LABORATORY DATA:  I have reviewed the data as listed Lab Results  Component Value Date   WBC 7.1 09/25/2012   HGB 14.9 09/25/2012   HCT 43.2 09/25/2012   MCV 90.3 09/25/2012   PLT 235.0 09/25/2012   Lab Results  Component Value  Date   NA 142 05/15/2015   K 4.5 05/15/2015   CL 105 05/15/2015   CO2 31 05/15/2015    RADIOGRAPHIC STUDIES: I have personally reviewed the radiological reports and agreed with the findings in the report.  ASSESSMENT AND PLAN:  Cancer of central portion of left female breast ([place]) Left breast biopsy  05/12/2015:  Grade 1 invasive ductal cancer with DCIS , ER 0%, PR 0%, Ki-67 5%, HER-2 negative ratio 0.83;  Mammogram showed architectural distortion left breast by ultrasound measures 7 mm , T1bN0 stage Ia clinical stage.  Pathology and radiology counseling:Discussed with the patient, the details of pathology including the type of breast cancer,the clinical staging, the significance of ER, PR and HER-2/neu receptors and the implications for treatment. After reviewing the pathology in detail, we proceeded to discuss the different treatment options between surgery and radiation  Recommendations: 1. Breast conserving surgery followed by 2. Adjuvant radiation therapy followed by 3. Observation   Although the patient is an excellent performance status ,Gven her advanced age, I did not recommend systemic chemotherapy.   return to clinic after surgery for follow-up  All questions were answered. The patient knows  to call the clinic with any problems, questions or concerns.    Rulon Eisenmenger, MD 1:54 PM

## 2015-06-01 NOTE — Assessment & Plan Note (Addendum)
Left breast biopsy  05/12/2015: invasive ductal cancer with DCIS , ER 0%, PR 0%, Ki-67 5%, HER-2 negative ratio 0.83;  Mammogram showed architectural distortion left breast by ultrasound measures 7 mm , T1bN0 stage Ia clinical stage.  Pathology and radiology counseling:Discussed with the patient, the details of pathology including the type of breast cancer,the clinical staging, the significance of ER, PR and HER-2/neu receptors and the implications for treatment. After reviewing the pathology in detail, we proceeded to discuss the different treatment options between surgery and radiation  Recommendations: 1. Breast conserving surgery followed by 2. Adjuvant radiation therapy followed by 3. Observation   Gven her advanced age, I did not recommend systemic chemotherapy.  Chemotherapy may be considered if   return to clinic after surgery for follow-up

## 2015-06-02 ENCOUNTER — Encounter: Payer: Self-pay | Admitting: Radiation Oncology

## 2015-06-03 ENCOUNTER — Encounter: Payer: Self-pay | Admitting: Radiation Oncology

## 2015-06-03 ENCOUNTER — Ambulatory Visit
Admission: RE | Admit: 2015-06-03 | Discharge: 2015-06-03 | Disposition: A | Discharge status: Home / Self Care | Payer: Medicare PPO | Source: Ambulatory Visit | Attending: Radiation Oncology | Admitting: Radiation Oncology

## 2015-06-03 VITALS — BP 134/57 | HR 78 | Temp 98.5°F | Ht 61.5 in | Wt 163.7 lb

## 2015-06-03 DIAGNOSIS — C50112 Malignant neoplasm of central portion of left female breast: Secondary | ICD-10-CM

## 2015-06-03 HISTORY — DX: Allergy, unspecified, initial encounter: T78.40XA

## 2015-06-03 HISTORY — DX: Malignant neoplasm of unspecified site of unspecified female breast: C50.919

## 2015-06-03 NOTE — Progress Notes (Signed)
Jessica Mullins in today for a consult.  Reports that she has a good appetite.  Has a normal energy level.  Says she has a history of melanoma and tends to get skin irritation, where her bra strip touches. Being treated for dry eye post cataract surgery at [facility].  Tends to loss her balance at times.  BP 134/57 mmHg  Pulse 78  Temp(Src) 98.5 F (36.9 C) (Oral)  Ht 5' 1.5" (1.562 m)  Wt 163 lb 11.2 oz (74.254 kg)  BMI 30.43 kg/m2  SpO2 96%  Wt Readings from Last 3 Encounters:  06/03/15 163 lb 11.2 oz (74.254 kg)  06/01/15 164 lb 3.2 oz (74.481 kg)  05/15/15 160 lb 6.4 oz (72.757 kg)

## 2015-06-03 NOTE — Progress Notes (Signed)
  Radiation Oncology         [phone]) [phone] ________________________________  Initial Outpatient Consultation - Date: 06/03/2015   Name: Jessica Mullins MRN: [id number]   DOB: [birth year]  REFERRING PHYSICIAN: Erroll Luna, MD  DIAGNOSIS AND STAGE: T1bN0 Stage I Triple Negative Left Breast Cancer  HISTORY OF PRESENT ILLNESS::Jessica Mullins is a 81 y.o. female  who had a routine screening mammogram on 04/28/15 that revealed a left breast distortion. She had another mammogram and an ultrasound on 05/04/15 that showed Breast Composition Category A and a 7 mm oval lesion in the left breast at the 3 o'clock middle depth. This time she also underwent ultrasound clinical biopsy on 05/12/15, by Dr. Brantley Stage,  which showed grade 1 invasive ductal carcinoma with DCIS, ER 0%, PR 0%, Ki-67 5%, HER-2 negative ratio 0.83. She was referred to Dr. Lindi Adie, on 06/01/15, regarding treatment options. He proposed breast conservation surgery, followed by adjuvant radiation, and then observation. She has also seen Dr. Brantley Stage from surgery. The patient will see genetic counseling on 06/11/15.  PREVIOUS RADIATION THERAPY: No  Past medical, social and family history were reviewed in the electronic chart. Review of symptoms was reviewed in the electronic chart. Medications were reviewed in the electronic chart.   PHYSICAL EXAM:  Filed Vitals:   06/03/15 1459  BP: 134/57  Pulse: 78  Temp: 98.5 F (36.9 C)  .163 lb 11.2 oz (74.254 kg). Ptotic breasts bilaterally. Biopsy site with palpable biopsy change in the inframammory fold of the left breast. No palpable abnormalities in the right breast. No palpable cervical, supraclavicular, or axillary adenopathy. Alert and oriented x3. Appears younger than her stated age.  IMPRESSION: 81 year old woman with T1bN0 Stage I Triple Negative Left Breast Cancer.  PLAN: I spoke to the patient today regarding her diagnosis and options for treatment. We discussed the equivalence in  terms of survival and local failure between mastectomy and breast conservation. We discussed the role of radiation in decreasing local failures in patients who undergo lumpectomy. We discussed the process of simulation and the placement tattoos. We discussed 4 weeks of treatment as an outpatient. We discussed the possibility of asymptomatic lung damage. We discussed the low likelihood of secondary malignancies. We discussed the possible side effects including, but not limited to, skin redness, fatigue, permanent skin darkening, and breast swelling. We discussed the process of simulation and the placement of tattoos. She met with medical oncology. I will plan on seeing her back after her surgery.  I spent 40 minutes  face to face with the patient and more than 50% of that time was spent in counseling and/or coordination of care.   ------------------------------------------------  Thea Silversmith, MD  This document serves as a record of services personally performed by Thea Silversmith, MD. It was created on her behalf by Darcus Austin, a trained medical scribe. The creation of this record is based on the scribe's personal observations and the provider's statements to them. This document has been checked and approved by the attending provider.

## 2015-06-04 ENCOUNTER — Other Ambulatory Visit: Payer: Self-pay

## 2015-06-04 ENCOUNTER — Telehealth: Payer: Self-pay | Admitting: Genetic Counselor

## 2015-06-04 NOTE — Progress Notes (Signed)
[facility] - Debbie in scheduling - she can get patient in for surgery on [id number] but conflict with genetics appt.  Advised Debbie to schedule surgery and we will move genetics.  POF entered.

## 2015-06-04 NOTE — Telephone Encounter (Signed)
Ms. Buys called to cancel her genetic counseling appt as she has surgery that day.  I gave her my number so she can reschedule at a later date.  She can also let her doctors know to reschedule following her surgery.

## 2015-06-05 ENCOUNTER — Telehealth: Payer: Self-pay | Admitting: Hematology and Oncology

## 2015-06-05 ENCOUNTER — Other Ambulatory Visit: Payer: Self-pay | Admitting: *Deleted

## 2015-06-09 DIAGNOSIS — Z0000 Encounter for general adult medical examination without abnormal findings: Secondary | ICD-10-CM | POA: Diagnosis not present

## 2015-06-09 DIAGNOSIS — Z1231 Encounter for screening mammogram for malignant neoplasm of breast: Secondary | ICD-10-CM | POA: Diagnosis not present

## 2015-06-09 DIAGNOSIS — R928 Other abnormal and inconclusive findings on diagnostic imaging of breast: Secondary | ICD-10-CM | POA: Diagnosis not present

## 2015-06-09 DIAGNOSIS — R922 Inconclusive mammogram: Secondary | ICD-10-CM | POA: Diagnosis not present

## 2015-06-09 DIAGNOSIS — C50412 Malignant neoplasm of upper-outer quadrant of left female breast: Secondary | ICD-10-CM | POA: Diagnosis not present

## 2015-06-10 ENCOUNTER — Encounter (HOSPITAL_COMMUNITY): Payer: Self-pay

## 2015-06-10 ENCOUNTER — Other Ambulatory Visit (HOSPITAL_COMMUNITY): Payer: Self-pay | Admitting: *Deleted

## 2015-06-10 ENCOUNTER — Encounter (HOSPITAL_COMMUNITY)
Admission: RE | Admit: 2015-06-10 | Discharge: 2015-06-10 | Disposition: A | Discharge status: Home / Self Care | Payer: Medicare PPO | Source: Ambulatory Visit | Attending: Surgery | Admitting: Surgery

## 2015-06-10 VITALS — BP 150/71 | HR 64 | Temp 98.3°F | Resp 18 | Ht 60.5 in | Wt 160.3 lb

## 2015-06-10 DIAGNOSIS — C50912 Malignant neoplasm of unspecified site of left female breast: Secondary | ICD-10-CM

## 2015-06-10 DIAGNOSIS — M1990 Unspecified osteoarthritis, unspecified site: Secondary | ICD-10-CM | POA: Diagnosis not present

## 2015-06-10 DIAGNOSIS — C50512 Malignant neoplasm of lower-outer quadrant of left female breast: Secondary | ICD-10-CM | POA: Diagnosis not present

## 2015-06-10 DIAGNOSIS — G709 Myoneural disorder, unspecified: Secondary | ICD-10-CM | POA: Diagnosis not present

## 2015-06-10 DIAGNOSIS — I10 Essential (primary) hypertension: Secondary | ICD-10-CM | POA: Diagnosis not present

## 2015-06-10 HISTORY — DX: Other fracture of left lower leg, initial encounter for closed fracture: S82.892A

## 2015-06-10 HISTORY — DX: Syncope and collapse: R55

## 2015-06-10 HISTORY — DX: Gastro-esophageal reflux disease without esophagitis: K21.9

## 2015-06-10 HISTORY — DX: Fracture of unspecified carpal bone, right wrist, initial encounter for closed fracture: S62.101A

## 2015-06-10 LAB — COMPREHENSIVE METABOLIC PANEL
ALT: 23 U/L (ref 14–54)
AST: 23 U/L (ref 15–41)
Albumin: 3.8 g/dL (ref 3.5–5.0)
Alkaline Phosphatase: 57 U/L (ref 38–126)
Anion gap: 6 (ref 5–15)
BUN: 13 mg/dL (ref 6–20)
CO2: 30 mmol/L (ref 22–32)
Calcium: 10.2 mg/dL (ref 8.9–10.3)
Chloride: 105 mmol/L (ref 101–111)
Creatinine, Ser: 0.79 mg/dL (ref 0.44–1.00)
GFR calc Af Amer: >60 mL/min (ref >60)
GFR calc non Af Amer: >60 mL/min (ref >60)
Glucose, Bld: 105 mg/dL — ABNORMAL HIGH (ref 65–99)
Potassium: 4.3 mmol/L (ref 3.5–5.1)
Sodium: 141 mmol/L (ref 135–145)
Total Bilirubin: 0.7 mg/dL (ref 0.3–1.2)
Total Protein: 6.5 g/dL (ref 6.5–8.1)

## 2015-06-10 LAB — CBC WITH DIFFERENTIAL/PLATELET
Basophils Absolute: 0 10*3/uL (ref 0.0–0.1)
Basophils Relative: 0 %
Eosinophils Absolute: 0.1 10*3/uL (ref 0.0–0.7)
Eosinophils Relative: 1 %
HCT: 44.9 % (ref 36.0–46.0)
Hemoglobin: 15.3 g/dL — ABNORMAL HIGH (ref 12.0–15.0)
Lymphocytes Relative: 20 %
Lymphs Abs: 1.8 10*3/uL (ref 0.7–4.0)
MCH: 30.8 pg (ref 26.0–34.0)
MCHC: 34.1 g/dL (ref 30.0–36.0)
MCV: 90.5 fL (ref 78.0–100.0)
Monocytes Absolute: 0.5 10*3/uL (ref 0.1–1.0)
Monocytes Relative: 5 %
Neutro Abs: 6.8 10*3/uL (ref 1.7–7.7)
Neutrophils Relative %: 74 %
Platelets: 248 10*3/uL (ref 150–400)
RBC: 4.96 MIL/uL (ref 3.87–5.11)
RDW: 14.6 % (ref 11.5–15.5)
WBC: 9.2 10*3/uL (ref 4.0–10.5)

## 2015-06-10 NOTE — Progress Notes (Signed)
Garnet Koyanagi, DO is patient's PCP.  Patient denies any cardiac hx or testing other than routine EKG at PCP.  EKG pending

## 2015-06-10 NOTE — Pre-Procedure Instructions (Signed)
    Jessica Mullins  06/10/2015       Your procedure is scheduled on Thursday, December 15,2 016 at 9:45 AM.   Report to [facility] Entrance "A" Admitting Office at 7:45 AM.   Call this number if you have problems the morning of surgery: [phone]     Remember:  Do not eat food or drink liquids after midnight tonight.  Take these medicines the morning of surgery with A SIP OF WATER: Omeprazole (Prilosec), eye drops   Do not wear jewelry, make-up or nail polish.  Do not wear lotions, powders, or perfumes.  You may NOT wear deodorant.  Do not shave 48 hours prior to surgery.    Do not bring valuables to the hospital.  [facility] is not responsible for any belongings or valuables.  Contacts, dentures or bridgework may not be worn into surgery.  Leave your suitcase in the car.  After surgery it may be brought to your room.  For patients admitted to the hospital, discharge time will be determined by your treatment team.  Patients discharged the day of surgery will not be allowed to drive home.   Special instructions:  See "Preparing for Surgery" Instruction sheet.  Please read over the following fact sheets that you were given. Pain Booklet, Coughing and Deep Breathing and Surgical Site Infection Prevention

## 2015-06-11 ENCOUNTER — Ambulatory Visit (HOSPITAL_COMMUNITY)
Admission: RE | Admit: 2015-06-11 | Discharge: 2015-06-11 | Disposition: A | Discharge status: Home / Self Care | Payer: Medicare PPO | Source: Ambulatory Visit | Attending: Surgery | Admitting: Surgery

## 2015-06-11 ENCOUNTER — Ambulatory Visit (HOSPITAL_COMMUNITY): Payer: Medicare PPO | Admitting: Anesthesiology

## 2015-06-11 ENCOUNTER — Encounter (HOSPITAL_COMMUNITY): Payer: Self-pay | Admitting: Anesthesiology

## 2015-06-11 ENCOUNTER — Encounter (HOSPITAL_COMMUNITY)
Admission: RE | Admit: 2015-06-11 | Discharge: 2015-06-11 | Disposition: A | Discharge status: Home / Self Care | Payer: Medicare PPO | Source: Ambulatory Visit | Attending: Surgery | Admitting: Surgery

## 2015-06-11 ENCOUNTER — Encounter: Payer: Medicare PPO | Admitting: Genetic Counselor

## 2015-06-11 ENCOUNTER — Encounter (HOSPITAL_COMMUNITY)
Admission: RE | Disposition: A | Discharge status: Home / Self Care | Payer: Self-pay | Source: Ambulatory Visit | Attending: Surgery

## 2015-06-11 DIAGNOSIS — C50512 Malignant neoplasm of lower-outer quadrant of left female breast: Secondary | ICD-10-CM | POA: Insufficient documentation

## 2015-06-11 DIAGNOSIS — C50912 Malignant neoplasm of unspecified site of left female breast: Secondary | ICD-10-CM

## 2015-06-11 DIAGNOSIS — Z0000 Encounter for general adult medical examination without abnormal findings: Secondary | ICD-10-CM | POA: Diagnosis not present

## 2015-06-11 DIAGNOSIS — R928 Other abnormal and inconclusive findings on diagnostic imaging of breast: Secondary | ICD-10-CM | POA: Diagnosis not present

## 2015-06-11 DIAGNOSIS — M1990 Unspecified osteoarthritis, unspecified site: Secondary | ICD-10-CM | POA: Insufficient documentation

## 2015-06-11 DIAGNOSIS — I10 Essential (primary) hypertension: Secondary | ICD-10-CM | POA: Diagnosis not present

## 2015-06-11 DIAGNOSIS — G709 Myoneural disorder, unspecified: Secondary | ICD-10-CM | POA: Diagnosis not present

## 2015-06-11 DIAGNOSIS — C773 Secondary and unspecified malignant neoplasm of axilla and upper limb lymph nodes: Secondary | ICD-10-CM | POA: Diagnosis not present

## 2015-06-11 DIAGNOSIS — R922 Inconclusive mammogram: Secondary | ICD-10-CM | POA: Diagnosis not present

## 2015-06-11 DIAGNOSIS — C50112 Malignant neoplasm of central portion of left female breast: Secondary | ICD-10-CM | POA: Diagnosis not present

## 2015-06-11 DIAGNOSIS — Z1231 Encounter for screening mammogram for malignant neoplasm of breast: Secondary | ICD-10-CM | POA: Diagnosis not present

## 2015-06-11 DIAGNOSIS — D0512 Intraductal carcinoma in situ of left breast: Secondary | ICD-10-CM | POA: Diagnosis not present

## 2015-06-11 HISTORY — PX: BREAST LUMPECTOMY WITH RADIOACTIVE SEED AND SENTINEL LYMPH NODE BIOPSY: SHX6550

## 2015-06-11 SURGERY — BREAST LUMPECTOMY WITH RADIOACTIVE SEED AND SENTINEL LYMPH NODE BIOPSY
Anesthesia: General | Site: Breast | Laterality: Left

## 2015-06-11 MED ORDER — SODIUM CHLORIDE 0.9 % IJ SOLN
INTRAMUSCULAR | Status: DC | PRN
  Administered 2015-06-11: 5 mL

## 2015-06-11 MED ORDER — LACTATED RINGERS IV SOLN
INTRAVENOUS | Status: DC
Start: 2015-06-11 — End: 2015-06-11
  Administered 2015-06-11: 08:00:00 via INTRAVENOUS

## 2015-06-11 MED ORDER — PHENYLEPHRINE HCL 10 MG/ML IJ SOLN
INTRAMUSCULAR | Status: DC | PRN
  Administered 2015-06-11: 80 ug via INTRAVENOUS

## 2015-06-11 MED ORDER — FENTANYL CITRATE (PF) 250 MCG/5ML IJ SOLN
INTRAMUSCULAR | Status: AC
  Filled 2015-06-11: qty 5

## 2015-06-11 MED ORDER — EPHEDRINE SULFATE 50 MG/ML IJ SOLN
INTRAMUSCULAR | Status: DC | PRN
  Administered 2015-06-11: 10 mg via INTRAVENOUS

## 2015-06-11 MED ORDER — PROPOFOL 10 MG/ML IV BOLUS
INTRAVENOUS | Status: DC | PRN
  Administered 2015-06-11: 150 mg via INTRAVENOUS

## 2015-06-11 MED ORDER — METHYLENE BLUE 1 % INJ SOLN
INTRAMUSCULAR | Status: AC
  Filled 2015-06-11: qty 10

## 2015-06-11 MED ORDER — FENTANYL CITRATE (PF) 100 MCG/2ML IJ SOLN
INTRAMUSCULAR | Status: DC
Start: 2015-06-11 — End: 2015-06-11
  Filled 2015-06-11: qty 2

## 2015-06-11 MED ORDER — FENTANYL CITRATE (PF) 100 MCG/2ML IJ SOLN
INTRAMUSCULAR | Status: DC | PRN
Start: 2015-06-11 — End: 2015-06-11
  Administered 2015-06-11: 50 ug via INTRAVENOUS

## 2015-06-11 MED ORDER — ONDANSETRON HCL 4 MG/2ML IJ SOLN
INTRAMUSCULAR | Status: DC | PRN
  Administered 2015-06-11: 4 mg via INTRAVENOUS

## 2015-06-11 MED ORDER — ROCURONIUM BROMIDE 50 MG/5ML IV SOLN
INTRAVENOUS | Status: AC
  Filled 2015-06-11: qty 1

## 2015-06-11 MED ORDER — EPHEDRINE SULFATE 50 MG/ML IJ SOLN
INTRAMUSCULAR | Status: AC
  Filled 2015-06-11: qty 1

## 2015-06-11 MED ORDER — OXYCODONE HCL 5 MG PO TABS: 5.0000 mg | ORAL_TABLET | Freq: Once | ORAL | Status: DC | PRN

## 2015-06-11 MED ORDER — SODIUM CHLORIDE 0.9 % IJ SOLN
INTRAMUSCULAR | Status: AC
  Filled 2015-06-11: qty 10

## 2015-06-11 MED ORDER — TECHNETIUM TC 99M SULFUR COLLOID FILTERED
1.0000 | Freq: Once | INTRAVENOUS | Status: AC | PRN
  Administered 2015-06-11: 1 via INTRADERMAL

## 2015-06-11 MED ORDER — MIDAZOLAM HCL 2 MG/2ML IJ SOLN
1.0000 mg | Freq: Once | INTRAMUSCULAR | Status: AC
  Administered 2015-06-11: 1 mg via INTRAVENOUS

## 2015-06-11 MED ORDER — BUPIVACAINE-EPINEPHRINE 0.25% -1:200000 IJ SOLN
INTRAMUSCULAR | Status: DC | PRN
  Administered 2015-06-11: 20 mL

## 2015-06-11 MED ORDER — LIDOCAINE HCL (CARDIAC) 20 MG/ML IV SOLN
INTRAVENOUS | Status: AC
  Filled 2015-06-11: qty 5

## 2015-06-11 MED ORDER — FENTANYL CITRATE (PF) 100 MCG/2ML IJ SOLN
25.0000 ug | INTRAMUSCULAR | Status: DC | PRN
  Administered 2015-06-11: 25 ug via INTRAVENOUS

## 2015-06-11 MED ORDER — PHENYLEPHRINE 40 MCG/ML (10ML) SYRINGE FOR IV PUSH (FOR BLOOD PRESSURE SUPPORT)
PREFILLED_SYRINGE | INTRAVENOUS | Status: AC
  Filled 2015-06-11: qty 10

## 2015-06-11 MED ORDER — BUPIVACAINE-EPINEPHRINE (PF) 0.25% -1:200000 IJ SOLN
INTRAMUSCULAR | Status: AC
  Filled 2015-06-11: qty 30

## 2015-06-11 MED ORDER — ONDANSETRON HCL 4 MG/2ML IJ SOLN: 4.0000 mg | Freq: Four times a day (QID) | INTRAMUSCULAR | Status: DC | PRN

## 2015-06-11 MED ORDER — ONDANSETRON HCL 4 MG/2ML IJ SOLN
INTRAMUSCULAR | Status: AC
  Administered 2015-06-11: 4 mg
  Filled 2015-06-11: qty 2

## 2015-06-11 MED ORDER — 0.9 % SODIUM CHLORIDE (POUR BTL) OPTIME
TOPICAL | Status: DC | PRN
  Administered 2015-06-11: 1000 mL

## 2015-06-11 MED ORDER — OXYCODONE HCL 5 MG/5ML PO SOLN: 5.0000 mg | Freq: Once | ORAL | Status: DC | PRN

## 2015-06-11 MED ORDER — MIDAZOLAM HCL 2 MG/2ML IJ SOLN
INTRAMUSCULAR | Status: AC
  Filled 2015-06-11: qty 2

## 2015-06-11 MED ORDER — CHLORHEXIDINE GLUCONATE 4 % EX LIQD: 1.0000 "application " | Freq: Once | CUTANEOUS | Status: DC

## 2015-06-11 MED ORDER — PROPOFOL 10 MG/ML IV BOLUS
INTRAVENOUS | Status: AC
  Filled 2015-06-11: qty 20

## 2015-06-11 MED ORDER — FENTANYL CITRATE (PF) 100 MCG/2ML IJ SOLN
50.0000 ug | Freq: Once | INTRAMUSCULAR | Status: AC
  Administered 2015-06-11: 50 ug via INTRAVENOUS

## 2015-06-11 MED ORDER — DEXTROSE 5 % IV SOLN
3.0000 g | INTRAVENOUS | Status: AC
  Administered 2015-06-11: 3 g via INTRAVENOUS
  Filled 2015-06-11: qty 3000

## 2015-06-11 MED ORDER — ONDANSETRON HCL 4 MG/2ML IJ SOLN
INTRAMUSCULAR | Status: AC
  Filled 2015-06-11: qty 2

## 2015-06-11 MED ORDER — LIDOCAINE HCL (CARDIAC) 20 MG/ML IV SOLN
INTRAVENOUS | Status: DC | PRN
  Administered 2015-06-11: 50 mg via INTRAVENOUS

## 2015-06-11 MED ORDER — GLYCOPYRROLATE 0.2 MG/ML IJ SOLN
INTRAMUSCULAR | Status: DC | PRN
  Administered 2015-06-11: 0.2 mg via INTRAVENOUS

## 2015-06-11 MED ORDER — HYDROCODONE-ACETAMINOPHEN 5-325 MG PO TABS: 1.0000 | ORAL_TABLET | Freq: Four times a day (QID) | ORAL | Status: DC | PRN

## 2015-06-11 MED ORDER — SODIUM CHLORIDE 0.9 % IV SOLN
10.0000 mg | INTRAVENOUS | Status: DC | PRN
  Administered 2015-06-11: 25 ug/min via INTRAVENOUS

## 2015-06-11 MED ORDER — FENTANYL CITRATE (PF) 100 MCG/2ML IJ SOLN
INTRAMUSCULAR | Status: AC
  Administered 2015-06-11: 25 ug
  Filled 2015-06-11: qty 2

## 2015-06-11 MED ORDER — LACTATED RINGERS IV SOLN
INTRAVENOUS | Status: DC | PRN
  Administered 2015-06-11: 09:00:00 via INTRAVENOUS

## 2015-06-11 SURGICAL SUPPLY — 45 items
APPLIER CLIP 9.375 MED OPEN (MISCELLANEOUS) ×3
APR CLP MED 9.3 20 MLT OPN (MISCELLANEOUS) ×1
BINDER BREAST LRG (GAUZE/BANDAGES/DRESSINGS) ×2 IMPLANT
BINDER BREAST XLRG (GAUZE/BANDAGES/DRESSINGS) IMPLANT
BLADE SURG 15 STRL LF DISP TIS (BLADE) ×1 IMPLANT
BLADE SURG 15 STRL SS (BLADE) ×3
CANISTER SUCTION 2500CC (MISCELLANEOUS) ×3 IMPLANT
CHLORAPREP W/TINT 26ML (MISCELLANEOUS) ×3 IMPLANT
CLIP APPLIE 9.375 MED OPEN (MISCELLANEOUS) ×1 IMPLANT
CONT SPEC 4OZ CLIKSEAL STRL BL (MISCELLANEOUS) ×16 IMPLANT
CONT SPEC STER OR (MISCELLANEOUS) ×5 IMPLANT
COVER PROBE W GEL 5X96 (DRAPES) ×3 IMPLANT
DRAPE CHEST BREAST 15X10 FENES (DRAPES) ×3 IMPLANT
DRAPE UTILITY W/TAPE 26X15 (DRAPES) ×3 IMPLANT
DRAPE UTILITY XL STRL (DRAPES) ×3 IMPLANT
ELECT CAUTERY BLADE 6.4 (BLADE) ×3 IMPLANT
ELECT REM PT RETURN 9FT ADLT (ELECTROSURGICAL) ×3
ELECTRODE REM PT RTRN 9FT ADLT (ELECTROSURGICAL) ×1 IMPLANT
GLOVE BIO SURGEON STRL SZ8 (GLOVE) ×3 IMPLANT
GLOVE BIOGEL PI IND STRL 8 (GLOVE) ×1 IMPLANT
GLOVE BIOGEL PI INDICATOR 8 (GLOVE) ×2
GOWN STRL REUS W/ TWL LRG LVL3 (GOWN DISPOSABLE) ×1 IMPLANT
GOWN STRL REUS W/ TWL XL LVL3 (GOWN DISPOSABLE) ×1 IMPLANT
GOWN STRL REUS W/TWL LRG LVL3 (GOWN DISPOSABLE) ×3
GOWN STRL REUS W/TWL XL LVL3 (GOWN DISPOSABLE) ×3
KIT BASIN OR (CUSTOM PROCEDURE TRAY) ×3 IMPLANT
KIT MARKER MARGIN INK (KITS) ×3 IMPLANT
LIQUID BAND (GAUZE/BANDAGES/DRESSINGS) ×3 IMPLANT
NDL HYPO 25X1 1.5 SAFETY (NEEDLE) ×1 IMPLANT
NDL SAFETY ECLIPSE 18X1.5 (NEEDLE) IMPLANT
NEEDLE HYPO 18GX1.5 SHARP (NEEDLE)
NEEDLE HYPO 25X1 1.5 SAFETY (NEEDLE) ×3 IMPLANT
NS IRRIG 1000ML POUR BTL (IV SOLUTION) ×3 IMPLANT
PACK SURGICAL SETUP 50X90 (CUSTOM PROCEDURE TRAY) ×3 IMPLANT
PENCIL BUTTON HOLSTER BLD 10FT (ELECTRODE) ×3 IMPLANT
SPONGE LAP 18X18 X RAY DECT (DISPOSABLE) ×3 IMPLANT
SUT MNCRL AB 4-0 PS2 18 (SUTURE) ×3 IMPLANT
SUT VIC AB 3-0 SH 18 (SUTURE) ×5 IMPLANT
SYR BULB 3OZ (MISCELLANEOUS) ×3 IMPLANT
SYR CONTROL 10ML LL (SYRINGE) ×3 IMPLANT
TOWEL OR 17X24 6PK STRL BLUE (TOWEL DISPOSABLE) ×3 IMPLANT
TOWEL OR 17X26 10 PK STRL BLUE (TOWEL DISPOSABLE) ×3 IMPLANT
TUBE CONNECTING 12'X1/4 (SUCTIONS) ×1
TUBE CONNECTING 12X1/4 (SUCTIONS) ×2 IMPLANT
YANKAUER SUCT BULB TIP NO VENT (SUCTIONS) ×3 IMPLANT

## 2015-06-11 NOTE — Anesthesia Preprocedure Evaluation (Addendum)
Anesthesia Evaluation  Patient identified by MRN, date of birth, ID band Patient awake    Reviewed: Allergy & Precautions, NPO status , Patient's Chart, lab work & pertinent test results  Airway Mallampati: II  TM Distance: >3 FB Neck ROM: full    Dental  (+) Teeth Intact, Dental Advidsory Given   Pulmonary neg pulmonary ROS,    breath sounds clear to auscultation       Cardiovascular hypertension, On Medications  Rhythm:regular Rate:Normal     Neuro/Psych  Neuromuscular disease    GI/Hepatic GERD  Medicated and Controlled,  Endo/Other    Renal/GU      Musculoskeletal   Abdominal   Peds  Hematology   Anesthesia Other Findings   Reproductive/Obstetrics                            Anesthesia Physical Anesthesia Plan  ASA: II  Anesthesia Plan: General   Post-op Pain Management:    Induction: Intravenous  Airway Management Planned: LMA  Additional Equipment:   Intra-op Plan:   Post-operative Plan:   Informed Consent: I have reviewed the patients History and Physical, chart, labs and discussed the procedure including the risks, benefits and alternatives for the proposed anesthesia with the patient or authorized representative who has indicated his/her understanding and acceptance.   Dental Advisory Given  Plan Discussed with: CRNA, Anesthesiologist and Surgeon  Anesthesia Plan Comments:        Anesthesia Quick Evaluation

## 2015-06-11 NOTE — Op Note (Signed)
Preoperative diagnosis: Stage I left breast cancer lower outer quadrant  Postoperative diagnosis: Same  Procedure: Left breast seed localized partial mastectomy with left axillary sentinel lymph node mapping using methylene blue dye  Surgeon: Erroll Luna M.D.  Anesthesia: LMA with 0.25% Sensorcaine local with epinephrine  EBL: Less than 20 mL  Specimens: #1 left breast lumpectomy with seed Specimen to multiple margins with cancer separate from seed. Clip and cancer in the initial additional inferior margin number next total cavity shave margins oriented anterior, deep, superior, second additional inferior, lateral, and medial. 3 sentinel nodes to pathology   Drains: None  Indications for procedure: The patient and 81 year old female with a stage I left breast cancer. This is triple negative. She was seen by oncology and no chemotherapy was planned unless extensive disease is identified. She was seen in consultation and I recommended lumpectomy. Given her triple negative status, I recommended a sentinel lymph node biopsy as well. She is in relatively good health.The procedure has been discussed with the patient. Alternatives to surgery have been discussed with the patient.  Risks of surgery include bleeding,  Infection,  Seroma formation, death,  and the need for further surgery.   The patient understands and wishes to proceed.Sentinel lymph node mapping and dissection has been discussed with the patient.  Risk of bleeding,  Infection,  Seroma formation,  Additional procedures,,  Shoulder weakness ,  Shoulder stiffness,  Nerve and blood vessel injury and reaction to the mapping dyes have been discussed.  Alternatives to surgery have been discussed with the patient.  The patient agrees to proceed.     Description of procedure:The patient was met in the holding area and questions were answered.  She underwent  Seed placement and nuclear medicine injection preoperatively.  Questions were  answered and seed checked in the holding area. The patient was taken back to the operating room and placed supine on the table. After induction of LMA anesthesia, the left breast was prepped and draped in a sterile fashion and timeout was done. Neoprobe was used and she is very weak counts in the left axilla after technetium injection. I used 4 mL of methylene blue dye admixed saline and injected in a subareolar position to assist mapping portion of the procedure. This was massaged for 5 minutes. Neoprobe was switched to iodine settings to locate proceed. This is in the left lower quadrant the breast. Curvilinear incision was made in the region dissection was carried down and all tissue around the seed was excised with a grossly negative margin. Radiograph revealed the seed to be in the specimen. I then excised the inferior margin which was approximately 4 cm from where the seat was located and found the tumor located there. Radiograph the specimen showed the clip in tumor to the this portion of the breast. Seed was superior to this. Tissue with a seed was located would be the superior margin the tumor. I elected to excise all margins this point in time to ensure of grossly negative margin. The cavity was irrigated with saline. Cavities closed with 3-0 Vicryl and 4-0 Monocryl. Neoprobe was then used to locate the left axillary sentinel node. The settings the machine were placed on technetium. Hotspot identified incision made in the left axilla of centimeters. Dissection was carried down 3 hot and blue sentinel nodes were identified and removed. Background counts approached 0. Hemostasis achieved in the wound was closed with 3-0 Vicryl and 4-0 Monocryl liquid adhesive applied. All final counts found to be  correct. Breast binder placed. The patient taken to recovery in satisfactory condition.

## 2015-06-11 NOTE — Anesthesia Postprocedure Evaluation (Signed)
Anesthesia Post Note  Patient: Jessica Mullins  Procedure(s) Performed: Procedure(s) (LRB): LEFT BREAST LUMPECTOMY WITH RADIOACTIVE SEED AND LEFT SENTINEL LYMPH NODE MAPPING (Left)  Patient location during evaluation: PACU Anesthesia Type: General Level of consciousness: awake and alert and patient cooperative Pain management: pain level controlled Vital Signs Assessment: post-procedure vital signs reviewed and stable Respiratory status: spontaneous breathing and respiratory function stable Cardiovascular status: stable Anesthetic complications: no    Last Vitals:  Filed Vitals:   06/11/15 1230 06/11/15 1245  BP: 115/63 117/64  Pulse: 59 61  Temp:    Resp: 14 11    Last Pain:  Filed Vitals:   06/11/15 1254  PainSc: [place]

## 2015-06-11 NOTE — Anesthesia Procedure Notes (Signed)
Procedure Name: LMA Insertion Date/Time: 06/11/2015 9:49 AM Performed by: Neldon Newport Pre-anesthesia Checklist: Patient being monitored, Suction available, Emergency Drugs available, Patient identified and Timeout performed Patient Re-evaluated:Patient Re-evaluated prior to inductionOxygen Delivery Method: Circle system utilized Preoxygenation: Pre-oxygenation with 100% oxygen Intubation Type: IV induction Ventilation: Mask ventilation without difficulty LMA: LMA inserted LMA Size: 4.0 Placement Confirmation: positive ETCO2,  ETT inserted through vocal cords under direct vision and breath sounds checked- equal and bilateral Tube secured with: Tape Dental Injury: Teeth and Oropharynx as per pre-operative assessment

## 2015-06-11 NOTE — Interval H&P Note (Signed)
History and Physical Interval Note:  06/11/2015 9:14 AM  Jessica Mullins  has presented today for surgery, with the diagnosis of breast cancer   The various methods of treatment have been discussed with the patient and family. After consideration of risks, benefits and other options for treatment, the patient has consented to  Procedure(s): LEFT BREAST LUMPECTOMY WITH RADIOACTIVE SEED AND LEFT SENTINEL LYMPH NODE MAPPING (Left) as a surgical intervention .  The patient's history has been reviewed, patient examined, no change in status, stable for surgery.  I have reviewed the patient's chart and labs.  Questions were answered to the patient's satisfaction.     [name] A.

## 2015-06-11 NOTE — H&P (View-Only) (Signed)
Jessica Mullins 05/25/2015 9:34 AM Location: [facility] Patient #: [id number] DOB: [birth year] Married / Language: English / Race: White Female  History of Present Illness Jessica Mullins A. Jessica Jans MD; 05/25/2015 12:17 PM) Patient words: breast cancer left Patient sent request of Dr. Marcelo Baldy for left breast cancer. She underwent her screening mammogram this year and a 7 mm abnormality was noted in the left breast upper outer quadrant. Core biopsy showed invasive mammary carcinoma. This was triple negative. She thinks her mother had breast cancer but is not sure. Denies any history of breast pain, breast mass or nipple discharge. Has a personal history of melanoma which is stable.  The patient is a 81 year old female.   Other Problems Jessica Mullins, CMA; 05/25/2015 9:34 AM) Arthritis Back Pain Breast Cancer Gastroesophageal Reflux Disease High blood pressure Hypercholesterolemia Melanoma Oophorectomy Bilateral. Other disease, cancer, significant illness  Past Surgical History Jessica Mullins, Jessica Mullins; 05/25/2015 9:34 AM) Breast Biopsy Left. Cataract Surgery Bilateral. Hysterectomy (not due to cancer) - Complete  Diagnostic Studies History Jessica Mullins, [name]; 05/25/2015 9:34 AM) Colonoscopy 5-10 years ago Mammogram within last year  Allergies Jessica Mullins, Jessica Mullins; 05/25/2015 9:35 AM) Dorma Russell Mullins Health Aware *MEDICAL DEVICES AND SUPPLIES* Sulfa 10 *OPHTHALMIC AGENTS*  Medication History Jessica Mullins, CMA; 05/25/2015 9:38 AM) Valsartan (160MG  Tablet, Oral) Active. Restasis (0.05% Emulsion, Ophthalmic) Active. Pravastatin Sodium (40MG  Tablet, Oral) Active. Omeprazole (20MG  Capsule DR, Oral) Active. Meloxicam (7.5MG  Tablet, Oral) Active. Alendronate Sodium (70MG  Tablet, Oral) Active. Turmeric (500MG  Capsule, Oral) Active. Propylene Glycol (Otic) (Otic) Active. Fish Oil Active. Multi Vitamin Daily (Oral)  Active. Vitamin D3 (1000UNIT Tablet, Oral) Active. Caltrate 600 (1500 (600 Ca)MG Tablet, Oral) Active. Biotin (5MG  Tablet, Oral) Active.  Social History Jessica Mullins, [name]; 05/25/2015 9:34 AM) Alcohol use Occasional alcohol use. Caffeine use Coffee, Tea. No drug use Tobacco use Never smoker.  Family History Jessica Mullins, [name]; 05/25/2015 9:34 AM) Alcohol Abuse Son. Arthritis Daughter. Breast Cancer Mother. Depression Daughter, Son. Diabetes Mellitus Brother. Heart Disease Brother, Father. Hypertension Brother, Son. Migraine Headache Daughter. Respiratory Condition Sister. Thyroid problems Daughter.  Pregnancy / Birth History Jessica Mullins, [name]; 05/25/2015 9:34 AM) Age at menarche 82 years. Age of menopause 51-55 Contraceptive History Contraceptive implant. Gravida 2 Maternal age 66-25 Para 2     Review of Systems ([place]. Brooks CMA; 05/25/2015 9:35 AM) General Not Present- Appetite Loss, Chills, Fatigue, Fever, Night Sweats, Weight Gain and Weight Loss. Skin Not Present- Change in Wart/Mole, Dryness, Hives, Jaundice, New Lesions, Non-Healing Wounds, Rash and Ulcer. HEENT Present- Seasonal Allergies, Visual Disturbances and Wears glasses/contact lenses. Not Present- Earache, Hearing Loss, Hoarseness, Nose Bleed, Oral Ulcers, Ringing in the Ears, Sinus Pain, Sore Throat and Yellow Eyes. Respiratory Present- Snoring. Not Present- Bloody sputum, Chronic Cough, Difficulty Breathing and Wheezing. Breast Present- Breast Mass. Not Present- Breast Pain, Nipple Discharge and Skin Changes. Cardiovascular Not Present- Chest Pain, Difficulty Breathing Lying Down, Leg Cramps, Palpitations, Rapid Heart Rate, Shortness of Breath and Swelling of Extremities. Gastrointestinal Present- Constipation. Not Present- Abdominal Pain, Bloating, Bloody Stool, Change in Bowel Habits, Chronic diarrhea, Difficulty Swallowing, Excessive gas, Gets full quickly at  meals, Hemorrhoids, Indigestion, Nausea, Rectal Pain and Vomiting. Female Genitourinary Present- Frequency. Not Present- Nocturia, Painful Urination, Pelvic Pain and Urgency. Musculoskeletal Present- Back Pain. Not Present- Joint Pain, Joint Stiffness, Muscle Pain, Muscle Weakness and Swelling of Extremities. Neurological Present- Trouble walking. Not Present- Decreased Memory, Fainting, Headaches, Numbness, Seizures, Tingling, Tremor and Weakness. Psychiatric  Not Present- Anxiety, Bipolar, Change in Sleep Pattern, Depression, Fearful and Frequent crying. Endocrine Not Present- Cold Intolerance, Excessive Hunger, Hair Changes, Heat Intolerance, Hot flashes and New Diabetes. Hematology Present- Easy Bruising. Not Present- Excessive bleeding, Gland problems, HIV and Persistent Infections.  Vitals [employer] R. Brooks CMA; 05/25/2015 9:34 AM) 05/25/2015 9:34 AM Weight: 163.25 lb Height: 60in Body Surface Area: 1.71 m Body Mass Index: 31.88 kg/m  BP: 140/90 (Sitting, Left Arm, Standard)      Physical Exam ([name] [name] MD; 05/25/2015 12:17 PM)  General Mental Status-Alert. General Appearance-Consistent with stated age. Hydration-Well hydrated. Voice-Normal.  Head and Neck Head-normocephalic, atraumatic with no lesions or palpable masses. Trachea-midline. Thyroid Gland Characteristics - normal size and consistency.  Eye Eyeball - Bilateral-Extraocular movements intact. Sclera/Conjunctiva - Bilateral-No scleral icterus.  Chest and Lung Exam Chest and lung exam reveals -quiet, even and easy respiratory effort with no use of accessory muscles and on auscultation, normal breath sounds, no adventitious sounds and normal vocal resonance. Inspection Chest Wall - Normal. Back - normal.  Breast Breast - Left-Symmetric, Non Tender, No Biopsy scars, no Dimpling, No Inflammation, No Lumpectomy scars, No Mastectomy scars, No Peau d' Orange. Breast -  Right-Symmetric, Non Tender, No Biopsy scars, no Dimpling, No Inflammation, No Lumpectomy scars, No Mastectomy scars, No Peau d' Orange. Breast Lump-No Palpable Breast Mass.  Cardiovascular Cardiovascular examination reveals -normal heart sounds, regular rate and rhythm with no murmurs and normal pedal pulses bilaterally.  Abdomen Inspection Inspection of the abdomen reveals - No Hernias. Skin - Scar - no surgical scars. Palpation/Percussion Palpation and Percussion of the abdomen reveal - Soft, Non Tender, No Rebound tenderness, No Rigidity (guarding) and No hepatosplenomegaly. Auscultation Auscultation of the abdomen reveals - Bowel sounds normal.  Neurologic Neurologic evaluation reveals -alert and oriented x 3 with no impairment of recent or remote memory. Mental Status-Normal.  Musculoskeletal Normal Exam - Left-Upper Extremity Strength Normal and Lower Extremity Strength Normal. Normal Exam - Right-Upper Extremity Strength Normal and Lower Extremity Strength Normal.  Lymphatic Head & Neck  General Head & Neck Lymphatics: Bilateral - Description - Normal. Axillary  General Axillary Region: Bilateral - Description - Normal. Tenderness - Non Tender. Femoral & Inguinal - Did not examine.    Assessment & Plan (Damani Kelemen A. Montae Stager MD; 05/25/2015 12:28 PM)  BREAST CANCER, LEFT (C50.912) Impression: Patient has small left breast cancer amendable to breast conservation therapy. She is triple negative and therefore we'll have her see medical oncology and radiation oncology preop. We'll plan a left breast lumpectomy with a sentinel lymph node mapping and less oncology feels this is not useful to them. Risk of lumpectomy include bleeding, infection, seroma, more surgery, use of seed/wire, wound care, cosmetic deformity and the need for other treatments, death , blood clots, death. Pt agrees to proceed. Risk of sentinel lymph node mapping include bleeding, infection,  lymphedema, shoulder pain. stiffness, dye allergy. cosmetic deformity , blood clots, death, need for more surgery. Pt agres to proceed.  Current Plans Referred to Genetic Counseling, for evaluation and follow up (Medical Genetics). Routine. Referred to Oncology, for evaluation and follow up (Oncology). Routine. Referred to Radiation Oncology, for evaluation and follow up (Radiation Oncology). Routine. Referred to Physical Therapy, for evaluation and follow up (Physical Therapy). Routine. You are being scheduled for surgery - Our schedulers will call you.  You should hear from our office's scheduling department within 5 working days about the location, date, and time of surgery. We try to make accommodations for  patient's preferences in scheduling surgery, but sometimes the OR schedule or the surgeon's schedule prevents [place] from making those accommodations.  If you have not heard from our office [phone]) in 5 working days, call the office and ask for your surgeon's nurse.  If you have other questions about your diagnosis, plan, or surgery, call the office and ask for your surgeon's nurse.  Pt Education - [facility] Breast Cancer Information Given - [facility] "Breast Journey" Package We discussed the staging and pathophysiology of breast cancer. We discussed all of the different options for treatment for breast cancer including surgery, chemotherapy, radiation therapy, Herceptin, and antiestrogen therapy. We discussed a sentinel lymph node biopsy as she does not appear to having lymph node involvement right now. We discussed the performance of that with injection of radioactive tracer and blue dye. We discussed that she would have an incision underneath her axillary hairline. We discussed that there is a bout a 10-20% chance of having a positive node with a sentinel lymph node biopsy and we will await the permanent pathology to make any other first further decisions in terms of her treatment. One of  these options might be to return to the operating room to perform an axillary lymph node dissection. We discussed about a 1-2% risk lifetime of chronic shoulder pain as well as lymphedema associated with a sentinel lymph node biopsy. We discussed the options for treatment of the breast cancer which included lumpectomy versus a mastectomy. We discussed the performance of the lumpectomy with a wire placement. We discussed a 10-20% chance of a positive margin requiring reexcision in the operating room. We also discussed that she may need radiation therapy or antiestrogen therapy or both if she undergoes lumpectomy. We discussed the mastectomy and the postoperative care for that as well. We discussed that there is no difference in her survival whether she undergoes lumpectomy with radiation therapy or antiestrogen therapy versus a mastectomy. There is a slight difference in the local recurrence rate being 3-5% with lumpectomy and about 1% with a mastectomy. We discussed the risks of operation including bleeding, infection, possible reoperation. She understands her further therapy will be based on what her stages at the time of her operation.  Pt Education - [facility] breast cancer surgery: discussed with patient and provided information. Pt Education - ABC (After Breast Cancer) Class Info: discussed with patient and provided information.

## 2015-06-11 NOTE — Discharge Instructions (Signed)
[facility] °Office Phone Number [phone] ° °BREAST BIOPSY/ PARTIAL MASTECTOMY: POST OP INSTRUCTIONS ° °Always review your discharge instruction sheet given to you by the facility where your surgery was performed. ° °IF YOU HAVE DISABILITY OR FAMILY LEAVE FORMS, YOU MUST BRING THEM TO THE OFFICE FOR PROCESSING.  DO NOT GIVE THEM TO YOUR DOCTOR. ° °1. A prescription for pain medication may be given to you upon discharge.  Take your pain medication as prescribed, if needed.  If narcotic pain medicine is not needed, then you may take acetaminophen (Tylenol) or ibuprofen (Advil) as needed. °2. Take your usually prescribed medications unless otherwise directed °3. If you need a refill on your pain medication, please contact your pharmacy.  They will contact our office to request authorization.  Prescriptions will not be filled after 5pm or on week-ends. °4. You should eat very light the first 24 hours after surgery, such as soup, crackers, pudding, etc.  Resume your normal diet the day after surgery. °5. Most patients will experience some swelling and bruising in the breast.  Ice packs and a good support bra will help.  Swelling and bruising can take several days to resolve.  °6. It is common to experience some constipation if taking pain medication after surgery.  Increasing fluid intake and taking a stool softener will usually help or prevent this problem from occurring.  A mild laxative (Milk of Magnesia or Miralax) should be taken according to package directions if there are no bowel movements after 48 hours. °7. Unless discharge instructions indicate otherwise, you may remove your bandages 24-48 hours after surgery, and you may shower at that time.  You may have steri-strips (small skin tapes) in place directly over the incision.  These strips should be left on the skin for 7-10 days.  If your surgeon used skin glue on the incision, you may shower in 24 hours.  The glue will flake off over the  next 2-3 weeks.  Any sutures or staples will be removed at the office during your follow-up visit. °8. ACTIVITIES:  You may resume regular daily activities (gradually increasing) beginning the next day.  Wearing a good support bra or sports bra minimizes pain and swelling.  You may have sexual intercourse when it is comfortable. °a. You may drive when you no longer are taking prescription pain medication, you can comfortably wear a seatbelt, and you can safely maneuver your car and apply brakes. °b. RETURN TO WORK:  ______________________________________________________________________________________ °9. You should see your doctor in the office for a follow-up appointment approximately two weeks after your surgery.  Your doctor’s nurse will typically make your follow-up appointment when she calls you with your pathology report.  Expect your pathology report 2-3 business days after your surgery.  You may call to check if you do not hear from us after three days. °10. OTHER INSTRUCTIONS: _______________________________________________________________________________________________ _____________________________________________________________________________________________________________________________________ °_____________________________________________________________________________________________________________________________________ °_____________________________________________________________________________________________________________________________________ ° °WHEN TO CALL YOUR DOCTOR: °1. Fever over 101.0 °2. Nausea and/or vomiting. °3. Extreme swelling or bruising. °4. Continued bleeding from incision. °5. Increased pain, redness, or drainage from the incision. ° °The clinic staff is available to answer your questions during regular business hours.  Please don’t hesitate to call and ask to speak to one of the nurses for clinical concerns.  If you have a medical emergency, go to the nearest  emergency room or call 911.  A surgeon from [facility] is always on call at the hospital. ° °For further questions, please visit [email or web address]  °

## 2015-06-11 NOTE — Transfer of Care (Signed)
Immediate Anesthesia Transfer of Care Note  Patient: Jessica Mullins  Procedure(s) Performed: Procedure(s): LEFT BREAST LUMPECTOMY WITH RADIOACTIVE SEED AND LEFT SENTINEL LYMPH NODE MAPPING (Left)  Patient Location: PACU  Anesthesia Type:General  Level of Consciousness: awake, alert  and oriented  Airway & Oxygen Therapy: Patient Spontanous Breathing and Patient connected to nasal cannula oxygen  Post-op Assessment: Report given to RN, Post -op Vital signs reviewed and stable and Patient moving all extremities X 4  Post vital signs: Reviewed and stable  Last Vitals:  Filed Vitals:   06/11/15 0925 06/11/15 1124  BP: 126/71   Pulse: 57   Temp:  36.6 C  Resp:  18    Complications: No apparent anesthesia complications

## 2015-06-12 ENCOUNTER — Encounter (HOSPITAL_COMMUNITY): Payer: Self-pay | Admitting: Surgery

## 2015-06-19 ENCOUNTER — Encounter: Payer: Self-pay | Admitting: Hematology and Oncology

## 2015-06-19 ENCOUNTER — Ambulatory Visit (HOSPITAL_BASED_OUTPATIENT_CLINIC_OR_DEPARTMENT_OTHER): Payer: Medicare PPO | Admitting: Hematology and Oncology

## 2015-06-19 VITALS — BP 158/58 | HR 63 | Temp 97.5°F | Resp 18 | Ht 60.5 in | Wt 158.2 lb

## 2015-06-19 DIAGNOSIS — C773 Secondary and unspecified malignant neoplasm of axilla and upper limb lymph nodes: Secondary | ICD-10-CM

## 2015-06-19 DIAGNOSIS — C50112 Malignant neoplasm of central portion of left female breast: Secondary | ICD-10-CM

## 2015-06-19 DIAGNOSIS — Z171 Estrogen receptor negative status [ER-]: Secondary | ICD-10-CM | POA: Diagnosis not present

## 2015-06-19 NOTE — Progress Notes (Signed)
Patient Care Team: Rosalita Chessman, DO as PCP - General Nicholas Lose, MD as Consulting Physician (Hematology and Oncology) Erroll Luna, MD as Consulting Physician (General Surgery)  DIAGNOSIS: No matching staging information was found for the patient.  SUMMARY OF ONCOLOGIC HISTORY:   Cancer of central portion of left female breast ([place])   05/04/2015 Mammogram  left breast distortion , breast density category A , 7 mm by ultrasound at 3:00 position middle depth   05/12/2015 Initial Diagnosis  left breast biopsy: invasive ductal cancer with DCIS , ER 0%, PR 0%, Ki-67 5%, HER-2 negative ratio 0.83   06/11/2015 Surgery Left lumpectomy: DCIS, left additional margin: IDC 1.3 cm + DCIS 1/4 LN positive, grade 2, margins negative, LVI present, ER 0%, PR 0%, HER-2 negative ratio 0.60, Ki-67 5%, T1cN1a stage II a    CHIEF COMPLIANT: follow-up after lumpectomy  INTERVAL HISTORY: Jessica Mullins is a 81 year old with above-mentioned history of left breast cancer treated with lumpectomy that revealed a 1.3 cm area of invasive ductal carcinoma. One out of 4 lymph nodes were positive. She is here today to discuss the results. She does not have any pain or discomfort from surgery. She is recovering very well from it.  REVIEW OF SYSTEMS:   Constitutional: Denies fevers, chills or abnormal weight loss Eyes: Denies blurriness of vision Ears, nose, mouth, throat, and face: Denies mucositis or sore throat Respiratory: Denies cough, dyspnea or wheezes Cardiovascular: Denies palpitation, chest discomfort Gastrointestinal:  Denies nausea, heartburn or change in bowel habits Skin: Denies abnormal skin rashes Lymphatics: Denies new lymphadenopathy or easy bruising Neurological:Denies numbness, tingling or new weaknesses Behavioral/Psych: Mood is stable, no new changes  Extremities: No lower extremity edema Breast: recent lumpectomy surgery. All other systems were reviewed with the patient and are  negative.  I have reviewed the past medical history, past surgical history, social history and family history with the patient and they are unchanged from previous note.  ALLERGIES:  is allergic to sulfa antibiotics; tape; glucosamine forte; and latex.  MEDICATIONS:  Current Outpatient Prescriptions  Medication Sig Dispense Refill  . alendronate (FOSAMAX) 70 MG tablet TAKE 1 TABLET (70 MG TOTAL) BY MOUTH EVERY SEVEN DAYS. TAKE WITH A FULL GLASS OF WATER AT NOON TIME ON EMPTY STOMACH. 4 tablet 11  . Biotin (BIOTIN 5000) 5 MG CAPS Take by mouth.    . Calcium Carbonate-Vitamin D (CALTRATE 600+D) 600-400 MG-UNIT per tablet Take 1 tablet by mouth daily.     . Cholecalciferol (VITAMIN D3) 1000 UNITS CAPS Take 1 capsule by mouth daily.      [name] HYDROcodone-acetaminophen (NORCO) 5-325 MG tablet Take 1-2 tablets by mouth every 6 (six) hours as needed for moderate pain. 30 tablet 0  . meloxicam (MOBIC) 7.5 MG tablet TAKE 1 TABLET (7.5 MG TOTAL) BY MOUTH DAILY. 30 tablet 1  . Multiple Vitamins-Minerals (ICAPS MV PO) Take 1 tablet by mouth 2 (two) times daily.     . Multiple Vitamins-Minerals (MULTIVITAMIN ADULT PO) Take 1 tablet by mouth daily.    . Omega-3 Fatty Acids (FISH OIL PO) Take 1 tablet by mouth daily.     [name] omeprazole (PRILOSEC) 20 MG capsule TAKE 1 CAPSULE (20 MG TOTAL) BY MOUTH DAILY. 30 capsule 11  . pravastatin (PRAVACHOL) 40 MG tablet TAKE 1 TABLET (40 MG TOTAL) BY MOUTH AT BEDTIME. 90 tablet 0  . Propylene Glycol 0.6 % SOLN Apply 1 drop to eye daily as needed (dry eyes).     [name]  RESTASIS 0.05 % ophthalmic emulsion Place 1 drop into both eyes 2 (two) times daily.    . Turmeric 500 MG CAPS Take 1 capsule by mouth daily.     . valsartan (DIOVAN) 160 MG tablet TAKE 1 TABLET (160 MG TOTAL) BY MOUTH DAILY. 90 tablet 1   No current facility-administered medications for this visit.    PHYSICAL EXAMINATION: ECOG PERFORMANCE STATUS: 1 - Symptomatic but completely ambulatory  Filed Vitals:    06/19/15 1149  BP: 158/58  Pulse: 63  Temp: 97.5 F (36.4 C)  Resp: 18   Filed Weights   06/19/15 1149  Weight: 158 lb 3.2 oz (71.759 kg)    GENERAL:alert, no distress and comfortable SKIN: skin color, texture, turgor are normal, no rashes or significant lesions EYES: normal, Conjunctiva are pink and non-injected, sclera clear OROPHARYNX:no exudate, no erythema and lips, buccal mucosa, and tongue normal  NECK: supple, thyroid normal size, non-tender, without nodularity LYMPH:  no palpable lymphadenopathy in the cervical, axillary or inguinal LUNGS: clear to auscultation and percussion with normal breathing effort HEART: regular rate & rhythm and no murmurs and no lower extremity edema ABDOMEN:abdomen soft, non-tender and normal bowel sounds MUSCULOSKELETAL:no cyanosis of digits and no clubbing  NEURO: alert & oriented x 3 with fluent speech, no focal motor/sensory deficits EXTREMITIES: No lower extremity edema  LABORATORY DATA:  I have reviewed the data as listed   Chemistry      Component Value Date/Time   NA 141 06/10/2015 0944   K 4.3 06/10/2015 0944   CL 105 06/10/2015 0944   CO2 30 06/10/2015 0944   BUN 13 06/10/2015 0944   CREATININE 0.79 06/10/2015 0944      Component Value Date/Time   CALCIUM 10.2 06/10/2015 0944   ALKPHOS 57 06/10/2015 0944   AST 23 06/10/2015 0944   ALT 23 06/10/2015 0944   BILITOT 0.7 06/10/2015 0944       Lab Results  Component Value Date   WBC 9.2 06/10/2015   HGB 15.3* 06/10/2015   HCT 44.9 06/10/2015   MCV 90.5 06/10/2015   PLT 248 06/10/2015   NEUTROABS 6.8 06/10/2015     ASSESSMENT & PLAN:  Cancer of central portion of left female breast ([place]) Left lumpectomy 06/11/2015: DCIS, left additional margin: IDC 1.3 cm + DCIS 1/4 LN positive, grade 2, margins negative, LVI present, ER 0%, PR 0%, HER-2 negative ratio 0.60, Ki-67 5%, T1cN1a stage II a  Pathology counseling: I discussed the final pathology report of the patient  provided  a copy of this report. I discussed the margins as well as lymph node surgeries. We also discussed the final staging along with previously performed ER/PR and HER-2/neu testing.  Recommendation: Her breast cancer is at very high risk of recurrence given the triple negative nature of this along with the positive lymph node. Patient has excellent performance status. I discussed with her the risks and benefits of 4 cycles of adjuvant Taxotere and Cytoxan which includes hair loss, nausea, fatigue, decrease in blood counts, risk of infection, fatigue, neuropathy risk, allergy reactions, permanent hair loss, long-term bone marrow toxicities as well.   We also discussed obtaining Mammaprint testing to evaluate if he is she was high risk. Patient understands that majority of triple negative breast cancers are high risk by Mammaprint testing however in the rare event that she was low risk, she may be spared from chemotherapy.  Patient would like to hold off on seeing radiation oncology until she gets a Therapist, art  report and makes a decision regarding chemotherapy.  Return to clinic based upon Mammaprint result.  If she does not get chemotherapy, she will then receive adjuvant radiation therapy and surveillance.  No orders of the defined types were placed in this encounter.   The patient has a good understanding of the overall plan. she agrees with it. she will call with any problems that may develop before the next visit here.   Rulon Eisenmenger, MD 06/19/2015

## 2015-06-19 NOTE — Assessment & Plan Note (Signed)
Left lumpectomy 06/11/2015: DCIS, left additional margin: IDC 1.3 cm + DCIS 1/4 LN positive, grade 2, margins negative, LVI present, ER 0%, PR 0%, HER-2 negative ratio 0.60, Ki-67 5%, T1cN1a stage II a  Pathology counseling: I discussed the final pathology report of the patient provided  a copy of this report. I discussed the margins as well as lymph node surgeries. We also discussed the final staging along with previously performed ER/PR and HER-2/neu testing.  Recommendation: 1. Her breast cancer is at very high risk of recurrence given the triple negative nature of this along with the positive lymph node. Patient has excellent performance status. I discussed with her the risks and benefits of 4 cycles of adjuvant Taxotere and Cytoxan. After understanding the risks and benefits, patient elected to not pursue adjuvant systemic chemotherapy.  2. Plan is to treat her with Adjuvant radiation therapy followed by surveillance.

## 2015-06-24 ENCOUNTER — Telehealth: Payer: Self-pay | Admitting: Genetic Counselor

## 2015-06-24 NOTE — Telephone Encounter (Signed)
Rescheduled genetic counseling appt for 10 AM on Monday, January 16th with Jessica Mullins.  [name] is aware that she should come about 15-20 mins early to check in.

## 2015-06-25 ENCOUNTER — Encounter: Payer: Self-pay | Admitting: *Deleted

## 2015-06-25 NOTE — Progress Notes (Signed)
Received order per Dr. Lindi Adie for Mammaprint testing. Requisition sent to pathology and [facility].

## 2015-07-01 ENCOUNTER — Ambulatory Visit: Payer: Medicare PPO | Admitting: Radiation Oncology

## 2015-07-01 ENCOUNTER — Ambulatory Visit: Payer: Medicare PPO

## 2015-07-06 ENCOUNTER — Encounter (HOSPITAL_COMMUNITY): Payer: Self-pay

## 2015-07-06 ENCOUNTER — Encounter: Payer: Self-pay | Admitting: *Deleted

## 2015-07-06 NOTE — Progress Notes (Signed)
Received mammaprint results of high risk.  Copy to medical records to scan and copy to Dr. Lindi Adie.

## 2015-07-07 ENCOUNTER — Other Ambulatory Visit: Payer: Self-pay | Admitting: *Deleted

## 2015-07-07 ENCOUNTER — Encounter: Payer: Self-pay | Admitting: Hematology and Oncology

## 2015-07-07 ENCOUNTER — Ambulatory Visit (HOSPITAL_BASED_OUTPATIENT_CLINIC_OR_DEPARTMENT_OTHER): Payer: Medicare PPO | Admitting: Hematology and Oncology

## 2015-07-07 VITALS — BP 166/75 | HR 81 | Temp 98.5°F | Resp 18 | Wt 157.6 lb

## 2015-07-07 DIAGNOSIS — C50112 Malignant neoplasm of central portion of left female breast: Secondary | ICD-10-CM

## 2015-07-07 DIAGNOSIS — D0512 Intraductal carcinoma in situ of left breast: Secondary | ICD-10-CM | POA: Diagnosis not present

## 2015-07-07 DIAGNOSIS — Z171 Estrogen receptor negative status [ER-]: Secondary | ICD-10-CM | POA: Diagnosis not present

## 2015-07-07 MED ORDER — ONDANSETRON HCL 8 MG PO TABS: 8.0000 mg | ORAL_TABLET | Freq: Two times a day (BID) | ORAL | Status: DC

## 2015-07-07 MED ORDER — PROCHLORPERAZINE MALEATE 10 MG PO TABS: 10.0000 mg | ORAL_TABLET | Freq: Four times a day (QID) | ORAL | Status: DC | PRN

## 2015-07-07 MED ORDER — DEXAMETHASONE 4 MG PO TABS: 4.0000 mg | ORAL_TABLET | Freq: Every day | ORAL | Status: DC

## 2015-07-07 MED ORDER — LIDOCAINE-PRILOCAINE 2.5-2.5 % EX CREA: TOPICAL_CREAM | CUTANEOUS | Status: DC

## 2015-07-07 MED ORDER — LORAZEPAM 0.5 MG PO TABS: 0.5000 mg | ORAL_TABLET | Freq: Every day | ORAL | Status: DC

## 2015-07-07 NOTE — Assessment & Plan Note (Signed)
Left lumpectomy 06/11/2015: DCIS, left additional margin: IDC 1.3 cm + DCIS 1/4 LN positive, grade 2, margins negative, LVI present, ER 0%, PR 0%, HER-2 negative ratio 0.60, Ki-67 5%, T1cN1a stage II a , Mammaprint high risk ( 5 year risk 22%,10 year risk 29%) luminal type B,  Distant metastasis free survival at 5 years 88% with chemotherapy.  Mammaprint counseling: I provided the patient a copy of the Mammaprint report and discussed the results. It came back as high risk disease with the benefit to doing systemic chemotherapy. Interestingly the molecular subtype came back as luminal type B. This does offer a slightly better prognosis than the basal type breast cancer. The prediction model  Suggests that with chemotherapy her 5 year distant metastases free survival was at 88%.  Recommendation: 1.  Adjuvant chemotherapy with Taxotere and Cytoxan 4 cycles 2.  Followed by adjuvant radiation therapy  I will request for port placement and chemotherapy education. Chemotherapy to start in 2 weeks.  Patient has been previously counseled regarding the risks and benefits of chemotherapy with Taxotere and Cytoxan and she fully understands the treatment plan.

## 2015-07-07 NOTE — Progress Notes (Signed)
Patient Care Team: Rosalita Chessman, DO as PCP - General Nicholas Lose, MD as Consulting Physician (Hematology and Oncology) Erroll Luna, MD as Consulting Physician (General Surgery)  DIAGNOSIS: No matching staging information was found for the patient.  SUMMARY OF ONCOLOGIC HISTORY:   Cancer of central portion of left female breast ([place])   05/04/2015 Mammogram  left breast distortion , breast density category A , 7 mm by ultrasound at 3:00 position middle depth   05/12/2015 Initial Diagnosis  left breast biopsy: invasive ductal cancer with DCIS , ER 0%, PR 0%, Ki-67 5%, HER-2 negative ratio 0.83   06/11/2015 Surgery Left lumpectomy: DCIS, left additional margin: IDC 1.3 cm + DCIS 1/4 LN positive, grade 2, margins negative, LVI present, ER 0%, PR 0%, HER-2 negative ratio 0.60, Ki-67 5%, T1cN1a stage II a, Mammaprint high risk luminal B    CHIEF COMPLIANT: Patient is here to discuss the results of Mammaprint  INTERVAL HISTORY: Jessica Mullins is a 82 year old with above-mentioned history of left breast cancer triple negative disease involving 1 lymph node. She underwent Mammaprint testing and is here today to discuss the results. She is recovering very well from surgery that is still some oozing from the surgical incision but otherwise she is doing quite well.  REVIEW OF SYSTEMS:   Constitutional: Denies fevers, chills or abnormal weight loss Eyes: Denies blurriness of vision Ears, nose, mouth, throat, and face: Denies mucositis or sore throat Respiratory: Denies cough, dyspnea or wheezes Cardiovascular: Denies palpitation, chest discomfort Gastrointestinal:  Denies nausea, heartburn or change in bowel habits Skin: Denies abnormal skin rashes Lymphatics: Denies new lymphadenopathy or easy bruising Neurological:Denies numbness, tingling or new weaknesses Behavioral/Psych: Mood is stable, no new changes  Extremities: No lower extremity edema Breast: healing from prior breast  surgery All other systems were reviewed with the patient and are negative.  I have reviewed the past medical history, past surgical history, social history and family history with the patient and they are unchanged from previous note.  ALLERGIES:  is allergic to sulfa antibiotics; tape; glucosamine forte; and latex.  MEDICATIONS:  Current Outpatient Prescriptions  Medication Sig Dispense Refill  . alendronate (FOSAMAX) 70 MG tablet TAKE 1 TABLET (70 MG TOTAL) BY MOUTH EVERY SEVEN DAYS. TAKE WITH A FULL GLASS OF WATER AT NOON TIME ON EMPTY STOMACH. 4 tablet 11  . Biotin (BIOTIN 5000) 5 MG CAPS Take by mouth.    . Calcium Carbonate-Vitamin D (CALTRATE 600+D) 600-400 MG-UNIT per tablet Take 1 tablet by mouth daily.     . Cholecalciferol (VITAMIN D3) 1000 UNITS CAPS Take 1 capsule by mouth daily.      [name] HYDROcodone-acetaminophen (NORCO) 5-325 MG tablet Take 1-2 tablets by mouth every 6 (six) hours as needed for moderate pain. 30 tablet 0  . meloxicam (MOBIC) 7.5 MG tablet TAKE 1 TABLET (7.5 MG TOTAL) BY MOUTH DAILY. 30 tablet 1  . Multiple Vitamins-Minerals (ICAPS MV PO) Take 1 tablet by mouth 2 (two) times daily.     . Multiple Vitamins-Minerals (MULTIVITAMIN ADULT PO) Take 1 tablet by mouth daily.    . Omega-3 Fatty Acids (FISH OIL PO) Take 1 tablet by mouth daily.     [name] omeprazole (PRILOSEC) 20 MG capsule TAKE 1 CAPSULE (20 MG TOTAL) BY MOUTH DAILY. 30 capsule 11  . pravastatin (PRAVACHOL) 40 MG tablet TAKE 1 TABLET (40 MG TOTAL) BY MOUTH AT BEDTIME. 90 tablet 0  . Propylene Glycol 0.6 % SOLN Apply 1 drop to eye daily  as needed (dry eyes).     . RESTASIS 0.05 % ophthalmic emulsion Place 1 drop into both eyes 2 (two) times daily.    . Turmeric 500 MG CAPS Take 1 capsule by mouth daily.     . valsartan (DIOVAN) 160 MG tablet TAKE 1 TABLET (160 MG TOTAL) BY MOUTH DAILY. 90 tablet 1   No current facility-administered medications for this visit.    PHYSICAL EXAMINATION: ECOG PERFORMANCE  STATUS: 1 - Symptomatic but completely ambulatory  Filed Vitals:   07/07/15 1449  BP: 166/75  Pulse: 81  Temp: 98.5 F (36.9 C)  Resp: 18   Filed Weights   07/07/15 1449  Weight: 157 lb 9.6 oz (71.487 kg)    GENERAL:alert, no distress and comfortable SKIN: skin color, texture, turgor are normal, no rashes or significant lesions EYES: normal, Conjunctiva are pink and non-injected, sclera clear OROPHARYNX:no exudate, no erythema and lips, buccal mucosa, and tongue normal  NECK: supple, thyroid normal size, non-tender, without nodularity LYMPH:  no palpable lymphadenopathy in the cervical, axillary or inguinal LUNGS: clear to auscultation and percussion with normal breathing effort HEART: regular rate & rhythm and no murmurs and no lower extremity edema ABDOMEN:abdomen soft, non-tender and normal bowel sounds MUSCULOSKELETAL:no cyanosis of digits and no clubbing  NEURO: alert & oriented x 3 with fluent speech, no focal motor/sensory deficits EXTREMITIES: No lower extremity edema  LABORATORY DATA:  I have reviewed the data as listed   Chemistry      Component Value Date/Time   NA 141 06/10/2015 0944   K 4.3 06/10/2015 0944   CL 105 06/10/2015 0944   CO2 30 06/10/2015 0944   BUN 13 06/10/2015 0944   CREATININE 0.79 06/10/2015 0944      Component Value Date/Time   CALCIUM 10.2 06/10/2015 0944   ALKPHOS 57 06/10/2015 0944   AST 23 06/10/2015 0944   ALT 23 06/10/2015 0944   BILITOT 0.7 06/10/2015 0944       Lab Results  Component Value Date   WBC 9.2 06/10/2015   HGB 15.3* 06/10/2015   HCT 44.9 06/10/2015   MCV 90.5 06/10/2015   PLT 248 06/10/2015   NEUTROABS 6.8 06/10/2015     ASSESSMENT & PLAN:  Cancer of central portion of left female breast ([place]) Left lumpectomy 06/11/2015: DCIS, left additional margin: IDC 1.3 cm + DCIS 1/4 LN positive, grade 2, margins negative, LVI present, ER 0%, PR 0%, HER-2 negative ratio 0.60, Ki-67 5%, T1cN1a stage II a , Mammaprint  high risk ( 5 year risk 22%,10 year risk 29%) luminal type B,  Distant metastasis free survival at 5 years 88% with chemotherapy.  Mammaprint counseling: I provided the patient a copy of the Mammaprint report and discussed the results. It came back as high risk disease with the benefit to doing systemic chemotherapy. Interestingly the molecular subtype came back as luminal type B. This does offer a slightly better prognosis than the basal type breast cancer. The prediction model  Suggests that with chemotherapy her 5 year distant metastases free survival was at 88%.  Recommendation: 1.  Adjuvant chemotherapy with Taxotere and Cytoxan 4 cycles 2.  Followed by adjuvant radiation therapy  I will request for port placement and chemotherapy education. Chemotherapy to start in 2 weeks.  Patient has been previously counseled regarding the risks and benefits of chemotherapy with Taxotere and Cytoxan and she fully understands the treatment plan.    No orders of the defined types were placed in this encounter.  The patient has a good understanding of the overall plan. she agrees with it. she will call with any problems that may develop before the next visit here.   Rulon Eisenmenger, MD 07/07/2015

## 2015-07-07 NOTE — Addendum Note (Signed)
Addended by: Prentiss Bells on: 07/07/2015 06:12 PM   Modules accepted: Medications

## 2015-07-08 ENCOUNTER — Telehealth: Payer: Self-pay

## 2015-07-08 NOTE — Telephone Encounter (Signed)
Ativan Rx faxed to [facility].  Sent to scan.

## 2015-07-09 ENCOUNTER — Other Ambulatory Visit: Payer: Self-pay | Admitting: Surgery

## 2015-07-09 ENCOUNTER — Telehealth: Payer: Self-pay | Admitting: Hematology and Oncology

## 2015-07-09 DIAGNOSIS — C50912 Malignant neoplasm of unspecified site of left female breast: Secondary | ICD-10-CM

## 2015-07-09 DIAGNOSIS — I878 Other specified disorders of veins: Secondary | ICD-10-CM

## 2015-07-09 NOTE — Telephone Encounter (Signed)
S/w pt, gave appt for chemo ed on 1/18 @ 10am.

## 2015-07-13 ENCOUNTER — Other Ambulatory Visit: Payer: Medicare PPO

## 2015-07-13 ENCOUNTER — Other Ambulatory Visit: Payer: Self-pay | Admitting: *Deleted

## 2015-07-13 ENCOUNTER — Encounter: Payer: Self-pay | Admitting: Genetic Counselor

## 2015-07-13 ENCOUNTER — Ambulatory Visit (HOSPITAL_BASED_OUTPATIENT_CLINIC_OR_DEPARTMENT_OTHER): Payer: Medicare PPO | Admitting: Genetic Counselor

## 2015-07-13 DIAGNOSIS — Z809 Family history of malignant neoplasm, unspecified: Secondary | ICD-10-CM | POA: Insufficient documentation

## 2015-07-13 DIAGNOSIS — Z315 Encounter for genetic counseling: Secondary | ICD-10-CM | POA: Diagnosis not present

## 2015-07-13 DIAGNOSIS — C439 Malignant melanoma of skin, unspecified: Secondary | ICD-10-CM

## 2015-07-13 DIAGNOSIS — C50112 Malignant neoplasm of central portion of left female breast: Secondary | ICD-10-CM | POA: Diagnosis not present

## 2015-07-13 NOTE — Progress Notes (Signed)
REFERRING PROVIDER: Rosalita Chessman, DO [address] [place], [place] [address]   Nicholas Lose, MD  PRIMARY PROVIDER:  Garnet Koyanagi, DO  PRIMARY REASON FOR VISIT:  1. Cancer of central portion of left female breast ([place])   2. Melanoma of skin ([place])      HISTORY OF PRESENT ILLNESS:   Jessica Mullins, a 82 y.o. female, was seen for a [facility] cancer genetics consultation at the request of Dr. Etter Sjogren due to a personal and family history of cancer.  Jessica Mullins presents to clinic today to discuss the possibility of a hereditary predisposition to cancer, genetic testing, and to further clarify her future cancer risks, as well as potential cancer risks for family members.   In 2001, at the age of 28, Jessica Mullins was diagnosed with melanoma of the left. This was treated with excision but no chemotherapy was needed.  In 2016, at the age of 28, Jessica Mullins was diagnosed with breast cancer.  The tumor is triple negative.  This will be treated with lumpectomy, chemotherapy and radiation.    CANCER HISTORY:    Cancer of central portion of left female breast ([place])   05/04/2015 Mammogram  left breast distortion , breast density category A , 7 mm by ultrasound at 3:00 position middle depth   05/12/2015 Initial Diagnosis  left breast biopsy: invasive ductal cancer with DCIS , ER 0%, PR 0%, Ki-67 5%, HER-2 negative ratio 0.83   06/11/2015 Surgery Left lumpectomy: DCIS, left additional margin: IDC 1.3 cm + DCIS 1/4 LN positive, grade 2, margins negative, LVI present, ER 0%, PR 0%, HER-2 negative ratio 0.60, Ki-67 5%, T1cN1a stage II a, Mammaprint high risk luminal B     HORMONAL RISK FACTORS:  Menarche was at age 17.  First live birth at age 39.  OCP use for approximately 5 years.  Ovaries intact: no.  Hysterectomy: yes.  Menopausal status: postmenopausal.  HRT use: 0 years. Colonoscopy: yes; normal. Mammogram within the last year: yes. Number of breast biopsies: 3. Up to date  with pelvic exams:  N/A - patient had full hysterctomy 30 years ago. Any excessive radiation exposure in the past:  no  Past Medical History  Diagnosis Date  . Hypertension   . Hyperlipidemia   . Bursitis of right shoulder   . Cataract   . Skin cancer 2000    melanoma and basal cell  . Breast cancer ([place]) 1115/16    left   . Allergy   . Vaso vagal episode     during preparation for colonoscopy  . GERD (gastroesophageal reflux disease)   . Fracture of right wrist   . Ankle fracture, left   . Family history of cancer     Past Surgical History  Procedure Laterality Date  . Appendectomy    . Ganglion cyst excision      L  hand  . Breast lumpectomy      B/L--FCS  . Lipiflow procedure    . Cataract extraction  2015  . Abdominal hysterectomy  1988    TAH/BSO--FIBROIDS  . Fiberadenoma Bilateral 1978, 1980  . Colonoscopy  2010  . Dg  bone density ([facility] hx)    . Eye surgery Bilateral     cataracts  . Breast lumpectomy with radioactive seed and sentinel lymph node biopsy Left 06/11/2015    Procedure: LEFT BREAST LUMPECTOMY WITH RADIOACTIVE SEED AND LEFT SENTINEL LYMPH NODE MAPPING;  Surgeon: Erroll Luna, MD;  Location: [facility]  OR;  Service: General;  Laterality: Left;    Social History   Social History  . Marital Status: Widowed    Spouse Name: N/A  . Number of Children: N/A  . Years of Education: N/A   Occupational History  . teacher     retired   Social History Main Topics  . Smoking status: Never Smoker   . Smokeless tobacco: Never Used  . Alcohol Use: No  . Drug Use: No  . Sexual Activity: No   Other Topics Concern  . None   Social History Narrative     FAMILY HISTORY:  We obtained a detailed, 4-generation family history.  Significant diagnoses are listed below: Family History  Problem Relation Age of Onset  . Lung cancer    . Thrombosis      thromboembolism clotting disorder--? father died of ? clot   . Cancer Mother     mets to bone--? primary  .  Coronary artery disease Brother     died of M! @ 14  . Diabetes Brother   . Hyperlipidemia Brother   . Hypertension Brother   . Heart disease Brother     chf  . Heart disease Father 31    MI  . Lung cancer Sister 23    former smoker  . Diabetes Maternal Aunt    The patient has one son and one daughter who are healthy and cancer free.  She had two siblings, a brother and a sister.  Her sister was a former smoker and died of lung cancer.  Her brother died of heart disease.  Both siblings died in their 53s.  The patient's mother died from bone cancer, the primary cancer is unknown, at 81.  She had 6-7 siblings.  Their cancer status is unknown to Ms. Noa.  The patient's father died at 15 from a heart attack.  His family is unknown to Ms. Fennelly as she was 9 when he died and his siblings were older than her father.  Patient's maternal ancestors are of [place] descent, and paternal ancestors are of [place] and [place] descent. There is reported Ashkenazi Jewish ancestry on the patient's maternal side. There is no known consanguinity.  GENETIC COUNSELING ASSESSMENT: Jessica Mullins is a 82 y.o. female with a personal history of triple negative breast cancer at age 24 with known Ashkenazi Jewish ancestry which is somewhat suggestive of a hereditary cancer syndrome and predisposition to cancer. We, therefore, discussed and recommended the following at today's visit.   DISCUSSION: We discussed that about 5-10% of breast cancer is hereditary, with the majority of hereditary breast cancer being the result of BRCA mutations. Based on Ms. Staff's Ashkenazi Jewish ancestry she meets criteria for testing for the [employer] mutations found in [place] and BRCA2.  We discussed that an increased number of individuals with breast cancer and Jewish ancestry have a founder mutation in either BRCA1 or BRCA2.  However, we typically see these individuals present at younger ages, with a stronger  family history of breast, ovarian, pancreatic or prostate cancer. We reviewed the characteristics, features and inheritance patterns of hereditary cancer syndromes. We also discussed genetic testing, including the appropriate family members to test, the process of testing, insurance coverage and turn-around-time for results. We discussed the implications of a negative, positive and/or variant of uncertain significant result. We recommended Ms. Lazarus pursue genetic testing for the Ashkenazi Jewish BRCA Founder mutation gene panel. The Ashkenazi Founder mutation panel performed by [facility] offers sequencing of  the following three sites: BRCA1 c.68_69delAG (also known as 185delAG or 187delAGE), BRCA1 c.5266dupC (also known as 5382insC or 5385insC) and BRCA2 c.5946delT (also known as 6174delT).   Based on Ms. Pressly's personal and family history of cancer, she meets medical criteria for genetic testing. Despite that she meets criteria, she may still have an out of pocket cost. We discussed that if her out of pocket cost for testing is over $100, the laboratory will call and confirm whether she wants to proceed with testing.  If the out of pocket cost of testing is less than $100 she will be billed by the genetic testing laboratory.   PLAN: After considering the risks, benefits, and limitations, Ms. Salih  provided informed consent to pursue genetic testing and the blood sample was sent to [facility] for analysis of the [place] Mutation Panel. Results should be available within approximately 7-10 days' time, at which point they will be disclosed by telephone to Ms. Elsasser, as will any additional recommendations warranted by these results. Ms. Kaseman will receive a summary of her genetic counseling visit and a copy of her results once available. This information will also be available in Epic. We encouraged Ms. Witte to remain in contact with cancer genetics annually so  that we can continuously update the family history and inform her of any changes in cancer genetics and testing that may be of benefit for her family. Ms. Callies questions were answered to her satisfaction today. Our contact information was provided should additional questions or concerns arise.  Lastly, we encouraged Ms. Prom to remain in contact with cancer genetics annually so that we can continuously update the family history and inform her of any changes in cancer genetics and testing that may be of benefit for this family.   Ms.  Leiterman questions were answered to her satisfaction today. Our contact information was provided should additional questions or concerns arise. Thank you for the referral and allowing [place] to share in the care of your patient.   Karen P. Florene Glen, [place], [facility] Certified Genetic Counselor [email or web address] phone: [phone]  The patient was seen for a total of 60 minutes in face-to-face genetic counseling.  This patient was discussed with Drs. Magrinat, Lindi Adie and/or Burr Medico who agrees with the above.    _______________________________________________________________________ For Office Staff:  Number of people involved in session: 3 Was an Intern/ student involved with case: yes

## 2015-07-14 ENCOUNTER — Other Ambulatory Visit: Payer: Self-pay | Admitting: Radiology

## 2015-07-15 ENCOUNTER — Other Ambulatory Visit: Payer: Medicare PPO

## 2015-07-15 ENCOUNTER — Ambulatory Visit (HOSPITAL_COMMUNITY)
Admission: RE | Admit: 2015-07-15 | Discharge: 2015-07-15 | Disposition: A | Discharge status: Home / Self Care | Payer: Medicare PPO | Source: Ambulatory Visit | Attending: Surgery | Admitting: Surgery

## 2015-07-15 ENCOUNTER — Encounter: Payer: Self-pay | Admitting: *Deleted

## 2015-07-15 ENCOUNTER — Encounter: Payer: Self-pay | Admitting: Hematology and Oncology

## 2015-07-15 ENCOUNTER — Encounter (HOSPITAL_COMMUNITY): Payer: Self-pay

## 2015-07-15 ENCOUNTER — Telehealth: Payer: Self-pay | Admitting: Hematology and Oncology

## 2015-07-15 DIAGNOSIS — E785 Hyperlipidemia, unspecified: Secondary | ICD-10-CM | POA: Insufficient documentation

## 2015-07-15 DIAGNOSIS — H269 Unspecified cataract: Secondary | ICD-10-CM | POA: Insufficient documentation

## 2015-07-15 DIAGNOSIS — M7551 Bursitis of right shoulder: Secondary | ICD-10-CM | POA: Diagnosis not present

## 2015-07-15 DIAGNOSIS — Z882 Allergy status to sulfonamides status: Secondary | ICD-10-CM | POA: Diagnosis not present

## 2015-07-15 DIAGNOSIS — C50912 Malignant neoplasm of unspecified site of left female breast: Secondary | ICD-10-CM | POA: Insufficient documentation

## 2015-07-15 DIAGNOSIS — I10 Essential (primary) hypertension: Secondary | ICD-10-CM | POA: Insufficient documentation

## 2015-07-15 DIAGNOSIS — K219 Gastro-esophageal reflux disease without esophagitis: Secondary | ICD-10-CM | POA: Insufficient documentation

## 2015-07-15 DIAGNOSIS — Z801 Family history of malignant neoplasm of trachea, bronchus and lung: Secondary | ICD-10-CM | POA: Insufficient documentation

## 2015-07-15 DIAGNOSIS — Z8249 Family history of ischemic heart disease and other diseases of the circulatory system: Secondary | ICD-10-CM | POA: Insufficient documentation

## 2015-07-15 DIAGNOSIS — Z452 Encounter for adjustment and management of vascular access device: Secondary | ICD-10-CM | POA: Diagnosis not present

## 2015-07-15 DIAGNOSIS — Z91040 Latex allergy status: Secondary | ICD-10-CM | POA: Diagnosis not present

## 2015-07-15 LAB — CBC
HCT: 42.2 % (ref 36.0–46.0)
Hemoglobin: 14.6 g/dL (ref 12.0–15.0)
MCH: 30.7 pg (ref 26.0–34.0)
MCHC: 34.6 g/dL (ref 30.0–36.0)
MCV: 88.7 fL (ref 78.0–100.0)
Platelets: 223 10*3/uL (ref 150–400)
RBC: 4.76 MIL/uL (ref 3.87–5.11)
RDW: 14.8 % (ref 11.5–15.5)
WBC: 9.9 10*3/uL (ref 4.0–10.5)

## 2015-07-15 LAB — BASIC METABOLIC PANEL
Anion gap: 10 (ref 5–15)
BUN: 15 mg/dL (ref 6–20)
CO2: 26 mmol/L (ref 22–32)
Calcium: 9.7 mg/dL (ref 8.9–10.3)
Chloride: 107 mmol/L (ref 101–111)
Creatinine, Ser: 0.67 mg/dL (ref 0.44–1.00)
GFR calc Af Amer: >60 mL/min (ref >60)
GFR calc non Af Amer: >60 mL/min (ref >60)
Glucose, Bld: 96 mg/dL (ref 65–99)
Potassium: 4.4 mmol/L (ref 3.5–5.1)
Sodium: 143 mmol/L (ref 135–145)

## 2015-07-15 LAB — APTT: aPTT: 30 seconds (ref 24–37)

## 2015-07-15 LAB — PROTIME-INR
INR: 1.07 (ref 0.00–1.49)
Prothrombin Time: 14.1 seconds (ref 11.6–15.2)

## 2015-07-15 MED ORDER — CEFAZOLIN SODIUM-DEXTROSE 2-3 GM-% IV SOLR
INTRAVENOUS | Status: AC
  Filled 2015-07-15: qty 50

## 2015-07-15 MED ORDER — HEPARIN SOD (PORK) LOCK FLUSH 100 UNIT/ML IV SOLN
INTRAVENOUS | Status: AC
  Filled 2015-07-15: qty 5

## 2015-07-15 MED ORDER — MIDAZOLAM HCL 2 MG/2ML IJ SOLN
INTRAMUSCULAR | Status: AC
  Filled 2015-07-15: qty 2

## 2015-07-15 MED ORDER — FENTANYL CITRATE (PF) 100 MCG/2ML IJ SOLN
INTRAMUSCULAR | Status: AC
  Filled 2015-07-15: qty 2

## 2015-07-15 MED ORDER — FENTANYL CITRATE (PF) 100 MCG/2ML IJ SOLN
INTRAMUSCULAR | Status: AC | PRN
  Administered 2015-07-15: 50 ug via INTRAVENOUS

## 2015-07-15 MED ORDER — LIDOCAINE HCL 1 % IJ SOLN
INTRAMUSCULAR | Status: AC
  Filled 2015-07-15: qty 20

## 2015-07-15 MED ORDER — HEPARIN SODIUM (PORCINE) 1000 UNIT/ML IJ SOLN
INTRAMUSCULAR | Status: AC | PRN
  Administered 2015-07-15: 500 [IU] via INTRAVENOUS

## 2015-07-15 MED ORDER — CEFAZOLIN SODIUM-DEXTROSE 2-3 GM-% IV SOLR
2.0000 g | INTRAVENOUS | Status: AC
  Administered 2015-07-15: 2 g via INTRAVENOUS

## 2015-07-15 MED ORDER — LIDOCAINE-EPINEPHRINE 2 %-1:100000 IJ SOLN
INTRAMUSCULAR | Status: AC
  Filled 2015-07-15: qty 1

## 2015-07-15 MED ORDER — MIDAZOLAM HCL 2 MG/2ML IJ SOLN
INTRAMUSCULAR | Status: AC | PRN
  Administered 2015-07-15 (×2): 1 mg via INTRAVENOUS

## 2015-07-15 MED ORDER — SODIUM CHLORIDE 0.9 % IV SOLN
Freq: Once | INTRAVENOUS | Status: AC
  Administered 2015-07-15: 12:00:00 via INTRAVENOUS

## 2015-07-15 NOTE — H&P (Signed)
Chief Complaint: Patient was seen in consultation today for port a cath placement  Referring Physician(s): Cornett,Thomas/Gudena,V  History of Present Illness: Jessica Mullins is a 82 y.o. female with history of recently diagnosed left breast carcinoma, s/p lumpectomy, who presents today for port a cath placement for chemotherapy.                                                                                                                                                                                                                                                                                                                                                                                                                                                   Past Medical History  Diagnosis Date  . Hypertension   . Hyperlipidemia   . Bursitis of right shoulder   . Cataract   . Skin cancer 2000    melanoma and basal cell  . Breast cancer ([place]) 1115/16    left   . Allergy   . Vaso vagal episode     during preparation for colonoscopy  . GERD (gastroesophageal reflux disease)   . Fracture of right wrist   . Ankle fracture, left   . Family history of cancer     Past Surgical History  Procedure Laterality Date  . Appendectomy    . Ganglion cyst excision  L  hand  . Breast lumpectomy      B/L--FCS  . Lipiflow procedure    . Cataract extraction  2015  . Abdominal hysterectomy  1988    TAH/BSO--FIBROIDS  . Fiberadenoma Bilateral 1978, 1980  . Colonoscopy  2010  . Dg  bone density ([facility] hx)    . Eye surgery Bilateral     cataracts  . Breast lumpectomy with radioactive seed and sentinel lymph node biopsy Left 06/11/2015    Procedure: LEFT BREAST LUMPECTOMY WITH RADIOACTIVE SEED AND LEFT SENTINEL LYMPH NODE MAPPING;  Surgeon: Erroll Luna, MD;  Location: [facility];  Service: General;  Laterality: Left;    Allergies: Sulfa antibiotics; Tape; Glucosamine forte; and  Latex  Medications: Prior to Admission medications   Medication Sig Start Date End Date Taking? Authorizing Provider  alendronate (FOSAMAX) 70 MG tablet TAKE 1 TABLET (70 MG TOTAL) BY MOUTH EVERY SEVEN DAYS. TAKE WITH A FULL GLASS OF WATER AT NOON TIME ON EMPTY STOMACH. 08/21/14   Rosalita Chessman, DO  Biotin (BIOTIN 5000) 5 MG CAPS Take by mouth.    Historical Provider, MD  Calcium Carbonate-Vitamin D (CALTRATE 600+D) 600-400 MG-UNIT per tablet Take 1 tablet by mouth daily.     Historical Provider, MD  Cholecalciferol (VITAMIN D3) 1000 UNITS CAPS Take 1 capsule by mouth daily.      Historical Provider, MD  dexamethasone (DECADRON) 4 MG tablet Take 1 tablet (4 mg total) by mouth daily. Start the day before Taxotere. Then again the day after chemo for 3 days. 07/07/15   Nicholas Lose, MD  HYDROcodone-acetaminophen (NORCO) 5-325 MG tablet Take 1-2 tablets by mouth every 6 (six) hours as needed for moderate pain. 06/11/15   Erroll Luna, MD  lidocaine-prilocaine (EMLA) cream Apply to affected area once 07/07/15   Nicholas Lose, MD  LORazepam (ATIVAN) 0.5 MG tablet Take 1 tablet (0.5 mg total) by mouth at bedtime. 07/07/15   Nicholas Lose, MD  loteprednol (LOTEMAX) 0.5 % ophthalmic suspension 1 drop OU TID x 1 week, then BID x 1 week, then qd x 1 week, then stop 07/04/14   Historical Provider, MD  meloxicam (MOBIC) 7.5 MG tablet TAKE 1 TABLET (7.5 MG TOTAL) BY MOUTH DAILY. 05/25/15   Rosalita Chessman, DO  Multiple Vitamins-Minerals (ICAPS MV PO) Take 1 tablet by mouth 2 (two) times daily.     Historical Provider, MD  Multiple Vitamins-Minerals (MULTIVITAMIN ADULT PO) Take 1 tablet by mouth daily.    Historical Provider, MD  Multiple Vitamins-Minerals (MULTIVITAMIN PO) Take by mouth.    Historical Provider, MD  Omega-3 Fatty Acids (FISH OIL PO) Take 1 tablet by mouth daily.     Historical Provider, MD  omeprazole (PRILOSEC) 20 MG capsule TAKE 1 CAPSULE (20 MG TOTAL) BY MOUTH DAILY. 12/26/14   Alferd Apa Lowne, DO   ondansetron (ZOFRAN) 8 MG tablet Take 1 tablet (8 mg total) by mouth 2 (two) times daily. Start the day after chemo for 3 days. Then take as needed for nausea or vomiting. 07/07/15   Nicholas Lose, MD  pravastatin (PRAVACHOL) 40 MG tablet TAKE 1 TABLET (40 MG TOTAL) BY MOUTH AT BEDTIME. 03/24/15   Rosalita Chessman, DO  prochlorperazine (COMPAZINE) 10 MG tablet Take 1 tablet (10 mg total) by mouth every 6 (six) hours as needed (Nausea or vomiting). 07/07/15   Nicholas Lose, MD  Propylene Glycol 0.6 % SOLN Apply 1 drop to eye daily as needed (dry eyes).     Historical Provider,  MD  RESTASIS 0.05 % ophthalmic emulsion Place 1 drop into both eyes 2 (two) times daily. 11/04/14   Historical Provider, MD  Turmeric 500 MG CAPS Take 1 capsule by mouth daily.     Historical Provider, MD  valsartan (DIOVAN) 160 MG tablet TAKE 1 TABLET (160 MG TOTAL) BY MOUTH DAILY. 12/12/14   Rosalita Chessman, DO     Family History  Problem Relation Age of Onset  . Lung cancer    . Thrombosis      thromboembolism clotting disorder--? father died of ? clot   . Cancer Mother     mets to bone--? primary  . Coronary artery disease Brother     died of M! @ 12  . Diabetes Brother   . Hyperlipidemia Brother   . Hypertension Brother   . Heart disease Brother     chf  . Heart disease Father 35    MI  . Lung cancer Sister 60    former smoker  . Diabetes Maternal Aunt     Social History   Social History  . Marital Status: Widowed    Spouse Name: N/A  . Number of Children: N/A  . Years of Education: N/A   Occupational History  . teacher     retired   Social History Main Topics  . Smoking status: Never Smoker   . Smokeless tobacco: Never Used  . Alcohol Use: No  . Drug Use: No  . Sexual Activity: No   Other Topics Concern  . None   Social History Narrative      Review of Systems  Constitutional: Negative for fever and chills.  Respiratory: Negative for shortness of breath.        Occ cough   Cardiovascular: Negative for chest pain.  Gastrointestinal: Negative for nausea, vomiting, abdominal pain and blood in stool.  Genitourinary: Negative for dysuria and hematuria.  Musculoskeletal: Negative for back pain.  Neurological: Negative for headaches.    Vital Signs: BP 190/76 mmHg  Pulse 65  Temp(Src) 97.8 F (36.6 C) (Oral)  Resp 16  SpO2 100%  Physical Exam  Constitutional: She is oriented to person, place, and time. She appears well-developed and well-nourished.  Cardiovascular: Normal rate and regular rhythm.   Pulmonary/Chest: Effort normal and breath sounds normal.  Abdominal: Soft. Bowel sounds are normal. There is no tenderness.  Musculoskeletal: Normal range of motion. She exhibits no edema.  Neurological: She is alert and oriented to person, place, and time.    Mallampati Score:     Imaging: No results found.  Labs:  CBC:  Recent Labs  06/10/15 0944 07/15/15 1220  WBC 9.2 9.9  HGB 15.3* 14.6  HCT 44.9 42.2  PLT 248 223    COAGS:  Recent Labs  07/15/15 1220  INR 1.07  APTT 30    BMP:  Recent Labs  11/11/14 1146 05/15/15 1406 06/10/15 0944 07/15/15 1220  NA 141 142 141 143  K 4.0 4.5 4.3 4.4  CL 104 105 105 107  CO2 32 31 30 26   GLUCOSE 100* 97 105* 96  BUN 13 14 13 15   CALCIUM 10.4 10.0 10.2 9.7  CREATININE 0.81 0.83 0.79 0.67  GFRNONAA  --   --  >60 >60  GFRAA  --   --  >60 >60    LIVER FUNCTION TESTS:  Recent Labs  11/11/14 1146 05/15/15 1406 06/10/15 0944  BILITOT 0.7 0.7 0.7  AST 22 22 23   ALT 20 20 23  ALKPHOS 54 56 57  PROT 6.9 6.7 6.5  ALBUMIN 4.5 4.2 3.8    TUMOR MARKERS: No results for input(s): AFPTM, CEA, CA199, CHROMGRNA in the last 8760 hours.  Assessment and Plan:  82 y.o. female with history of recently diagnosed left breast carcinoma, s/p lumpectomy, who presents today for port a cath placement for chemotherapy.Risks and benefits discussed with the patient including, but not limited to  bleeding, infection, pneumothorax, or fibrin sheath development and need for additional procedures.All of the patient's questions were answered, patient is agreeable to proceed.Consent signed and in chart.     Thank you for this interesting consult.  I greatly enjoyed meeting Kollins L Pelfrey and look forward to participating in their care.  A copy of this report was sent to the requesting provider on this date.  Electronically Signed: D. Rowe Robert 07/15/2015, 1:14 PM   I spent a total of 15 minutes in face to face in clinical consultation, greater than 50% of which was counseling/coordinating care for port a cath placement

## 2015-07-15 NOTE — Telephone Encounter (Signed)
Patient here for chemo class. Appointments made and avs given

## 2015-07-15 NOTE — Sedation Documentation (Signed)
Patient denies pain and is resting comfortably.  

## 2015-07-15 NOTE — Procedures (Signed)
Successful RT IJ POWER PORT TIP SVC/RA NO COMP STABLE READY FOR USE FULL REPORT IN PACS EBL 0  

## 2015-07-15 NOTE — Discharge Instructions (Signed)
Implanted Port Home Guide °An implanted port is a type of central line that is placed under the skin. Central lines are used to provide IV access when treatment or nutrition needs to be given through a person's veins. Implanted ports are used for long-term IV access. An implanted port may be placed because:  °· You need IV medicine that would be irritating to the small veins in your hands or arms.   °· You need long-term IV medicines, such as antibiotics.   °· You need IV nutrition for a long period.   °· You need frequent blood draws for lab tests.   °· You need dialysis.   °Implanted ports are usually placed in the chest area, but they can also be placed in the upper arm, the abdomen, or the leg. An implanted port has two main parts:  °· Reservoir. The reservoir is round and will appear as a small, raised area under your skin. The reservoir is the part where a needle is inserted to give medicines or draw blood.   °· Catheter. The catheter is a thin, flexible tube that extends from the reservoir. The catheter is placed into a large vein. Medicine that is inserted into the reservoir goes into the catheter and then into the vein.   °HOW WILL I CARE FOR MY INCISION SITE? °Do not get the incision site wet. Bathe or shower as directed by your health care provider.  °HOW IS MY PORT ACCESSED? °Special steps must be taken to access the port:  °· Before the port is accessed, a numbing cream can be placed on the skin. This helps numb the skin over the port site.   °· Your health care provider uses a sterile technique to access the port. °· Your health care provider must put on a mask and sterile gloves. °· The skin over your port is cleaned carefully with an antiseptic and allowed to dry. °· The port is gently pinched between sterile gloves, and a needle is inserted into the port. °· Only "non-coring" port needles should be used to access the port. Once the port is accessed, a blood return should be checked. This helps  ensure that the port is in the vein and is not clogged.   °· If your port needs to remain accessed for a constant infusion, a clear (transparent) bandage will be placed over the needle site. The bandage and needle will need to be changed every week, or as directed by your health care provider.   °· Keep the bandage covering the needle clean and dry. Do not get it wet. Follow your health care provider's instructions on how to take a shower or bath while the port is accessed.   °· If your port does not need to stay accessed, no bandage is needed over the port.   °WHAT IS FLUSHING? °Flushing helps keep the port from getting clogged. Follow your health care provider's instructions on how and when to flush the port. Ports are usually flushed with saline solution or a medicine called heparin. The need for flushing will depend on how the port is used.  °· If the port is used for intermittent medicines or blood draws, the port will need to be flushed:   °· After medicines have been given.   °· After blood has been drawn.   °· As part of routine maintenance.   °· If a constant infusion is running, the port may not need to be flushed.   °HOW LONG WILL MY PORT STAY IMPLANTED? °The port can stay in for as long as your health care   provider thinks it is needed. When it is time for the port to come out, surgery will be done to remove it. The procedure is similar to the one performed when the port was put in.  °WHEN SHOULD I SEEK IMMEDIATE MEDICAL CARE? °When you have an implanted port, you should seek immediate medical care if:  °· You notice a bad smell coming from the incision site.   °· You have swelling, redness, or drainage at the incision site.   °· You have more swelling or pain at the port site or the surrounding area.   °· You have a fever that is not controlled with medicine. °  °This information is not intended to replace advice given to you by your health care provider. Make sure you discuss any questions you have with  your health care provider. °  °Document Released: 06/13/2005 Document Revised: 04/03/2013 Document Reviewed: 02/18/2013 °Elsevier Interactive Patient Education ©2016 [employer] °Implanted Port Insertion, Care After °Refer to this sheet in the next few weeks. These instructions provide you with information on caring for yourself after your procedure. Your health care provider may also give you more specific instructions. Your treatment has been planned according to current medical practices, but problems sometimes occur. Call your health care provider if you have any problems or questions after your procedure. °WHAT TO EXPECT AFTER THE PROCEDURE °After your procedure, it is typical to have the following:  °· Discomfort at the port insertion site. Ice packs to the area will help. °· Bruising on the skin over the port. This will subside in 3-4 days. °HOME CARE INSTRUCTIONS °· After your port is placed, you will get a manufacturer's information card. The card has information about your port. Keep this card with you at all times.   °· Know what kind of port you have. There are many types of ports available.   °· Wear a medical alert bracelet in case of an emergency. This can help alert health care workers that you have a port.   °· The port can stay in for as long as your health care provider believes it is necessary.   °· A home health care nurse may give medicines and take care of the port.   °· You or a family member can get special training and directions for giving medicine and taking care of the port at home.   °SEEK MEDICAL CARE IF:  °· Your port does not flush or you are unable to get a blood return.   °· You have a fever or chills. °SEEK IMMEDIATE MEDICAL CARE IF: °· You have new fluid or pus coming from your incision.   °· You notice a bad smell coming from your incision site.   °· You have swelling, pain, or more redness at the incision or port site.   °· You have chest pain or shortness of breath. °  °This  information is not intended to replace advice given to you by your health care provider. Make sure you discuss any questions you have with your health care provider. °  °Document Released: 04/03/2013 Document Revised: 06/18/2013 Document Reviewed: 04/03/2013 °Elsevier Interactive Patient Education ©2016 [employer] °Moderate Conscious Sedation, Adult, Care After °Refer to this sheet in the next few weeks. These instructions provide you with information on caring for yourself after your procedure. Your health care provider may also give you more specific instructions. Your treatment has been planned according to current medical practices, but problems sometimes occur. Call your health care provider if you have any problems or questions after your   procedure. °WHAT TO EXPECT AFTER THE PROCEDURE  °After your procedure: °· You may feel sleepy, clumsy, and have poor balance for several hours. °· Vomiting may occur if you eat too soon after the procedure. °HOME CARE INSTRUCTIONS °· Do not participate in any activities where you could become injured for at least 24 hours. Do not: °¨ Drive. °¨ Swim. °¨ Ride a bicycle. °¨ Operate heavy machinery. °¨ Cook. °¨ Use power tools. °¨ Climb ladders. °¨ Work from a high place. °· Do not make important decisions or sign legal documents until you are improved. °· If you vomit, drink water, juice, or soup when you can drink without vomiting. Make sure you have little or no nausea before eating solid foods. °· Only take over-the-counter or prescription medicines for pain, discomfort, or fever as directed by your health care provider. °· Make sure you and your family fully understand everything about the medicines given to you, including what side effects may occur. °· You should not drink alcohol, take sleeping pills, or take medicines that cause drowsiness for at least 24 hours. °· If you smoke, do not smoke without supervision. °· If you are feeling better, you may resume normal  activities 24 hours after you were sedated. °· Keep all appointments with your health care provider. °SEEK MEDICAL CARE IF: °· Your skin is pale or bluish in color. °· You continue to feel nauseous or vomit. °· Your pain is getting worse and is not helped by medicine. °· You have bleeding or swelling. °· You are still sleepy or feeling clumsy after 24 hours. °SEEK IMMEDIATE MEDICAL CARE IF: °· You develop a rash. °· You have difficulty breathing. °· You develop any type of allergic problem. °· You have a fever. °MAKE SURE YOU: °· Understand these instructions. °· Will watch your condition. °· Will get help right away if you are not doing well or get worse. °  °This information is not intended to replace advice given to you by your health care provider. Make sure you discuss any questions you have with your health care provider. °  °Document Released: 04/03/2013 Document Revised: 07/04/2014 Document Reviewed: 04/03/2013 °Elsevier Interactive Patient Education ©2016 [employer] Inc. ° °

## 2015-07-15 NOTE — Progress Notes (Signed)
I sent email to karen powell to advise [employer] denied testing lack of medical necessity. They advised peer to peer must be done [id number] case ref# [id number]

## 2015-07-16 ENCOUNTER — Ambulatory Visit: Admission: RE | Admit: 2015-07-16 | Payer: Medicare PPO | Source: Ambulatory Visit

## 2015-07-16 ENCOUNTER — Ambulatory Visit
Admission: RE | Admit: 2015-07-16 | Discharge: 2015-07-16 | Disposition: A | Discharge status: Home / Self Care | Payer: Medicare PPO | Source: Ambulatory Visit | Attending: Radiation Oncology | Admitting: Radiation Oncology

## 2015-07-16 ENCOUNTER — Inpatient Hospital Stay: Admission: RE | Admit: 2015-07-16 | Payer: Medicare PPO | Source: Ambulatory Visit | Admitting: Radiation Oncology

## 2015-07-16 ENCOUNTER — Ambulatory Visit: Payer: Medicare PPO | Admitting: Radiation Oncology

## 2015-07-16 ENCOUNTER — Telehealth: Payer: Self-pay | Admitting: *Deleted

## 2015-07-16 ENCOUNTER — Inpatient Hospital Stay: Admission: RE | Admit: 2015-07-16 | Payer: Medicare PPO | Source: Ambulatory Visit

## 2015-07-16 ENCOUNTER — Ambulatory Visit: Admission: RE | Admit: 2015-07-16 | Payer: Medicare PPO | Source: Ambulatory Visit | Admitting: Radiation Oncology

## 2015-07-16 ENCOUNTER — Ambulatory Visit: Payer: Medicare PPO

## 2015-07-16 NOTE — Telephone Encounter (Signed)
Received telephone advice record from [facility], sent to scan. Patient called and port has some redness but no pain, swelling, drainage or fever. Patient to see Dr. Brantley Stage in am and will have him access it. Patient advised to call if she develops any other symptoms. She verbalized understanding.

## 2015-07-19 NOTE — Assessment & Plan Note (Signed)
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  Treatment Plan: 1. Adjuvant chemotherapy with Taxotere and Cytoxan 4 cycles 2. Followed by adjuvant radiation therapy ----------------------------------------------------------------------------------------------------------------------- Current Treatment: Cycle 1 day 1 Taxotere Cytoxan Anti emetics were reviewed RTC in 1 week for Tox check

## 2015-07-20 ENCOUNTER — Encounter: Payer: Self-pay | Admitting: Hematology and Oncology

## 2015-07-20 ENCOUNTER — Encounter: Payer: Self-pay | Admitting: *Deleted

## 2015-07-20 ENCOUNTER — Ambulatory Visit (HOSPITAL_BASED_OUTPATIENT_CLINIC_OR_DEPARTMENT_OTHER): Payer: Medicare PPO | Admitting: Hematology and Oncology

## 2015-07-20 ENCOUNTER — Other Ambulatory Visit (HOSPITAL_BASED_OUTPATIENT_CLINIC_OR_DEPARTMENT_OTHER): Payer: Medicare PPO

## 2015-07-20 ENCOUNTER — Encounter: Payer: Self-pay | Admitting: General Practice

## 2015-07-20 ENCOUNTER — Ambulatory Visit (HOSPITAL_BASED_OUTPATIENT_CLINIC_OR_DEPARTMENT_OTHER): Payer: Medicare PPO

## 2015-07-20 VITALS — BP 149/65 | HR 71 | Temp 97.6°F | Resp 16

## 2015-07-20 VITALS — BP 161/75 | HR 61 | Temp 97.6°F | Resp 18 | Ht 60.5 in | Wt 155.7 lb

## 2015-07-20 DIAGNOSIS — Z171 Estrogen receptor negative status [ER-]: Secondary | ICD-10-CM | POA: Diagnosis not present

## 2015-07-20 DIAGNOSIS — Z5111 Encounter for antineoplastic chemotherapy: Secondary | ICD-10-CM | POA: Diagnosis not present

## 2015-07-20 DIAGNOSIS — C50112 Malignant neoplasm of central portion of left female breast: Secondary | ICD-10-CM | POA: Diagnosis not present

## 2015-07-20 DIAGNOSIS — Z5189 Encounter for other specified aftercare: Secondary | ICD-10-CM | POA: Diagnosis not present

## 2015-07-20 DIAGNOSIS — C439 Malignant melanoma of skin, unspecified: Secondary | ICD-10-CM | POA: Diagnosis not present

## 2015-07-20 LAB — CBC WITH DIFFERENTIAL/PLATELET
BASO%: 0.5 % (ref 0.0–2.0)
Basophils Absolute: 0.1 10*3/uL (ref 0.0–0.1)
EOS%: 0.2 % (ref 0.0–7.0)
Eosinophils Absolute: 0 10*3/uL (ref 0.0–0.5)
HCT: 43.5 % (ref 34.8–46.6)
HGB: 14.5 g/dL (ref 11.6–15.9)
LYMPH%: 21.1 % (ref 14.0–49.7)
MCH: 30.1 pg (ref 25.1–34.0)
MCHC: 33.2 g/dL (ref 31.5–36.0)
MCV: 90.4 fL (ref 79.5–101.0)
MONO#: 1.1 10*3/uL — ABNORMAL HIGH (ref 0.1–0.9)
MONO%: 8.7 % (ref 0.0–14.0)
NEUT#: 8.5 10*3/uL — ABNORMAL HIGH (ref 1.5–6.5)
NEUT%: 69.5 % (ref 38.4–76.8)
Platelets: 224 10*3/uL (ref 145–400)
RBC: 4.81 10*6/uL (ref 3.70–5.45)
RDW: 14.3 % (ref 11.2–14.5)
WBC: 12.3 10*3/uL — ABNORMAL HIGH (ref 3.9–10.3)
lymph#: 2.6 10*3/uL (ref 0.9–3.3)

## 2015-07-20 LAB — COMPREHENSIVE METABOLIC PANEL
ALT: 16 U/L (ref 0–55)
AST: 19 U/L (ref 5–34)
Albumin: 3.9 g/dL (ref 3.5–5.0)
Alkaline Phosphatase: 65 U/L (ref 40–150)
Anion Gap: 9 mEq/L (ref 3–11)
BUN: 13.9 mg/dL (ref 7.0–26.0)
CO2: 29 mEq/L (ref 22–29)
Calcium: 10.4 mg/dL (ref 8.4–10.4)
Chloride: 104 mEq/L (ref 98–109)
Creatinine: 0.8 mg/dL (ref 0.6–1.1)
EGFR: 65 mL/min/{1.73_m2} — ABNORMAL LOW (ref >90)
Glucose: 66 mg/dl — ABNORMAL LOW (ref 70–140)
Potassium: 4.3 mEq/L (ref 3.5–5.1)
Sodium: 142 mEq/L (ref 136–145)
Total Bilirubin: 0.58 mg/dL (ref 0.20–1.20)
Total Protein: 6.6 g/dL (ref 6.4–8.3)

## 2015-07-20 MED ORDER — HEPARIN SOD (PORK) LOCK FLUSH 100 UNIT/ML IV SOLN
500.0000 [IU] | Freq: Once | INTRAVENOUS | Status: AC | PRN
  Administered 2015-07-20: 500 [IU]
  Filled 2015-07-20: qty 5

## 2015-07-20 MED ORDER — PALONOSETRON HCL INJECTION 0.25 MG/5ML
INTRAVENOUS | Status: AC
  Filled 2015-07-20: qty 5

## 2015-07-20 MED ORDER — SODIUM CHLORIDE 0.9 % IV SOLN
Freq: Once | INTRAVENOUS | Status: AC
  Administered 2015-07-20: 09:00:00 via INTRAVENOUS

## 2015-07-20 MED ORDER — PALONOSETRON HCL INJECTION 0.25 MG/5ML
0.2500 mg | Freq: Once | INTRAVENOUS | Status: AC
  Administered 2015-07-20: 0.25 mg via INTRAVENOUS

## 2015-07-20 MED ORDER — DOCETAXEL CHEMO INJECTION 160 MG/16ML
75.0000 mg/m2 | Freq: Once | INTRAVENOUS | Status: AC
  Administered 2015-07-20: 130 mg via INTRAVENOUS
  Filled 2015-07-20: qty 13

## 2015-07-20 MED ORDER — SODIUM CHLORIDE 0.9 % IV SOLN
Freq: Once | INTRAVENOUS | Status: AC
  Administered 2015-07-20: 10:00:00 via INTRAVENOUS
  Filled 2015-07-20: qty 1

## 2015-07-20 MED ORDER — SODIUM CHLORIDE 0.9 % IV SOLN
600.0000 mg/m2 | Freq: Once | INTRAVENOUS | Status: AC
  Administered 2015-07-20: 1060 mg via INTRAVENOUS
  Filled 2015-07-20: qty 53

## 2015-07-20 MED ORDER — PEGFILGRASTIM 6 MG/0.6ML ~~LOC~~ PSKT
6.0000 mg | PREFILLED_SYRINGE | Freq: Once | SUBCUTANEOUS | Status: AC
  Administered 2015-07-20: 6 mg via SUBCUTANEOUS
  Filled 2015-07-20: qty 0.6

## 2015-07-20 MED ORDER — SODIUM CHLORIDE 0.9 % IJ SOLN
10.0000 mL | INTRAMUSCULAR | Status: DC | PRN
  Administered 2015-07-20: 10 mL
  Filled 2015-07-20: qty 10

## 2015-07-20 NOTE — Progress Notes (Signed)
Received telephone advice record from [facility], sent to scan. Patient seen in clinic today and all questions addressed by MD.

## 2015-07-20 NOTE — Progress Notes (Signed)
Patient Care Team: Rosalita Chessman, DO as PCP - General Nicholas Lose, MD as Consulting Physician (Hematology and Oncology) Erroll Luna, MD as Consulting Physician (General Surgery)  DIAGNOSIS: No matching staging information was found for the patient.  SUMMARY OF ONCOLOGIC HISTORY:   Cancer of central portion of left female breast ([place])   05/04/2015 Mammogram  left breast distortion , breast density category A , 7 mm by ultrasound at 3:00 position middle depth   05/12/2015 Initial Diagnosis  left breast biopsy: invasive ductal cancer with DCIS , ER 0%, PR 0%, Ki-67 5%, HER-2 negative ratio 0.83   06/11/2015 Surgery Left lumpectomy: DCIS, left additional margin: IDC 1.3 cm + DCIS 1/4 LN positive, grade 2, margins negative, LVI present, ER 0%, PR 0%, HER-2 negative ratio 0.60, Ki-67 5%, T1cN1a stage II a, Mammaprint high risk luminal B   07/20/2015 -  Chemotherapy Taxotere and Cytoxan adjuvant chemotherapy  4 cycles    CHIEF COMPLIANT:  Cycle 1 day 1 Taxotere and Cytoxan  INTERVAL HISTORY: Jessica Mullins is a  82 year old with above-mentioned history of left breast triple negative disease with lymph node involvement who is here today to start first cycle of chemotherapy with Taxotere and Cytoxan. She had undergone port placement and chemotherapy counseling.  REVIEW OF SYSTEMS:   Constitutional: Denies feve rs, chills or abnormal weight loss Eyes: Denies blurriness of vision Ears, nose, mouth, throat, and face: Denies mucositis or sore throat Respiratory: Denies cough, dyspnea or wheezes Cardiovascular: Denies palpitation, chest discomfort Gastrointestinal:  Denies nausea, heartburn or change in bowel habits Skin: Denies abnormal skin rashes Lymphatics: Denies new lymphadenopathy or easy bruising Neurological:Denies numbness, tingling or new weaknesses Behavioral/Psych: Mood is stable, no new changes  Extremities: No lower extremity edema All other systems were reviewed with the  patient and are negative.  I have reviewed the past medical history, past surgical history, social history and family history with the patient and they are unchanged from previous note.  ALLERGIES:  is allergic to sulfa antibiotics; tape; glucosamine forte; and latex.  MEDICATIONS:  Current Outpatient Prescriptions  Medication Sig Dispense Refill  . alendronate (FOSAMAX) 70 MG tablet TAKE 1 TABLET (70 MG TOTAL) BY MOUTH EVERY SEVEN DAYS. TAKE WITH A FULL GLASS OF WATER AT NOON TIME ON EMPTY STOMACH. 4 tablet 11  . Biotin (BIOTIN 5000) 5 MG CAPS Take by mouth.    . Calcium Carbonate-Vitamin D (CALTRATE 600+D) 600-400 MG-UNIT per tablet Take 1 tablet by mouth daily.     . Cholecalciferol (VITAMIN D3) 1000 UNITS CAPS Take 1 capsule by mouth daily.      [name] dexamethasone (DECADRON) 4 MG tablet Take 1 tablet (4 mg total) by mouth daily. Start the day before Taxotere. Then again the day after chemo for 3 days. 30 tablet 1  . HYDROcodone-acetaminophen (NORCO) 5-325 MG tablet Take 1-2 tablets by mouth every 6 (six) hours as needed for moderate pain. 30 tablet 0  . lidocaine-prilocaine (EMLA) cream Apply to affected area once 30 g 3  . LORazepam (ATIVAN) 0.5 MG tablet Take 1 tablet (0.5 mg total) by mouth at bedtime. 30 tablet 0  . loteprednol (LOTEMAX) 0.5 % ophthalmic suspension 1 drop OU TID x 1 week, then BID x 1 week, then qd x 1 week, then stop    . meloxicam (MOBIC) 7.5 MG tablet TAKE 1 TABLET (7.5 MG TOTAL) BY MOUTH DAILY. 30 tablet 1  . Multiple Vitamins-Minerals (ICAPS MV PO) Take 1 tablet by mouth 2 (  two) times daily.     . Multiple Vitamins-Minerals (MULTIVITAMIN ADULT PO) Take 1 tablet by mouth daily.    . Multiple Vitamins-Minerals (MULTIVITAMIN PO) Take by mouth.    . Omega-3 Fatty Acids (FISH OIL PO) Take 1 tablet by mouth daily.     [name] omeprazole (PRILOSEC) 20 MG capsule TAKE 1 CAPSULE (20 MG TOTAL) BY MOUTH DAILY. 30 capsule 11  . ondansetron (ZOFRAN) 8 MG tablet Take 1 tablet (8 mg  total) by mouth 2 (two) times daily. Start the day after chemo for 3 days. Then take as needed for nausea or vomiting. 30 tablet 1  . pravastatin (PRAVACHOL) 40 MG tablet TAKE 1 TABLET (40 MG TOTAL) BY MOUTH AT BEDTIME. 90 tablet 0  . prochlorperazine (COMPAZINE) 10 MG tablet Take 1 tablet (10 mg total) by mouth every 6 (six) hours as needed (Nausea or vomiting). 30 tablet 1  . Propylene Glycol 0.6 % SOLN Apply 1 drop to eye daily as needed (dry eyes).     . RESTASIS 0.05 % ophthalmic emulsion Place 1 drop into both eyes 2 (two) times daily.    . Turmeric 500 MG CAPS Take 1 capsule by mouth daily.     . valsartan (DIOVAN) 160 MG tablet TAKE 1 TABLET (160 MG TOTAL) BY MOUTH DAILY. 90 tablet 1   No current facility-administered medications for this visit.    PHYSICAL EXAMINATION: ECOG PERFORMANCE STATUS: 0 - Asymptomatic  There were no vitals filed for this visit. There were no vitals filed for this visit.  GENERAL:alert, no distress and comfortable SKIN: skin color, texture, turgor are normal, no rashes or significant lesions EYES: normal, Conjunctiva are pink and non-injected, sclera clear OROPHARYNX:no exudate, no erythema and lips, buccal mucosa, and tongue normal  NECK: supple, thyroid normal size, non-tender, without nodularity LYMPH:  no palpable lymphadenopathy in the cervical, axillary or inguinal LUNGS: clear to auscultation and percussion with normal breathing effort HEART: regular rate & rhythm and no murmurs and no lower extremity edema ABDOMEN:abdomen soft, non-tender and normal bowel sounds MUSCULOSKELETAL:no cyanosis of digits and no clubbing  NEURO: alert & oriented x 3 with fluent speech, no focal motor/sensory deficits EXTREMITIES: No lower extremity edema  LABORATORY DATA:  I have reviewed the data as listed   Chemistry      Component Value Date/Time   NA 143 07/15/2015 1220   K 4.4 07/15/2015 1220   CL 107 07/15/2015 1220   CO2 26 07/15/2015 1220   BUN 15  07/15/2015 1220   CREATININE 0.67 07/15/2015 1220      Component Value Date/Time   CALCIUM 9.7 07/15/2015 1220   ALKPHOS 57 06/10/2015 0944   AST 23 06/10/2015 0944   ALT 23 06/10/2015 0944   BILITOT 0.7 06/10/2015 0944       Lab Results  Component Value Date   WBC 12.3* 07/20/2015   HGB 14.5 07/20/2015   HCT 43.5 07/20/2015   MCV 90.4 07/20/2015   PLT 224 07/20/2015   NEUTROABS 8.5* 07/20/2015     ASSESSMENT & PLAN:  Cancer of central portion of left female breast ([place]) Left lumpectomy 06/11/2015: DCIS, left additional margin: IDC 1.3 cm + DCIS 1/4 LN positive, grade 2, margins negative, LVI present, ER 0%, PR 0%, HER-2 negative ratio 0.60, Ki-67 5%, T1cN1a stage II a , Mammaprint high risk ( 5 year risk 22%,10 year risk 29%) luminal type B, Distant metastasis free survival at 5 years 88% with chemotherapy.  Treatment Plan: 1. Adjuvant chemotherapy  with Taxotere and Cytoxan 4 cycles 2. Followed by adjuvant radiation therapy ----------------------------------------------------------------------------------------------------------------------- Current Treatment: Cycle 1 day 1 Taxotere Cytoxan Anti emetics were reviewed RTC in 1 week for Tox check  Discussed with the patient extensively all of her medications that she is currently taking as well as the supplements that she is currently on. FMLA paperwork was requested  No orders of the defined types were placed in this encounter.   The patient has a good understanding of the overall plan. she agrees with it. she will call with any problems that may develop before the next visit here.   Rulon Eisenmenger, MD 07/20/2015

## 2015-07-20 NOTE — Progress Notes (Signed)
Met with patient in treatment area to introduce myself as her Estate manager/land agent. Gave patient my card if she has any financial questions or concerns to call me. Also any questions or concerns regarding bills she receives. Asked patient if she had any concerns regarding her 20% responsibility. Patient states no she is ok right now.

## 2015-07-20 NOTE — Patient Instructions (Addendum)
[facility] Discharge Instructions for Patients Receiving Chemotherapy  Today you received the following chemotherapy agents taxotere, and cytoxan  To help prevent nausea and vomiting after your treatment, we encourage you to take your nausea medication:  As discusssed with pharmacist.  If you develop nausea and vomiting that is not controlled by your nausea medication, call the clinic.   BELOW ARE SYMPTOMS THAT SHOULD BE REPORTED IMMEDIATELY:  *FEVER GREATER THAN 100.5 F  *CHILLS WITH OR WITHOUT FEVER  NAUSEA AND VOMITING THAT IS NOT CONTROLLED WITH YOUR NAUSEA MEDICATION  *UNUSUAL SHORTNESS OF BREATH  *UNUSUAL BRUISING OR BLEEDING  TENDERNESS IN MOUTH AND THROAT WITH OR WITHOUT PRESENCE OF ULCERS  *URINARY PROBLEMS  *BOWEL PROBLEMS  UNUSUAL RASH Items with * indicate a potential emergency and should be followed up as soon as possible.  Feel free to call the clinic you have any questions or concerns. The clinic phone number is [phone].  Please show the [facility] at check-in to the Emergency Department and triage nurse.

## 2015-07-20 NOTE — Progress Notes (Signed)
placed form for dr. Lindi Adie to sign

## 2015-07-20 NOTE — Progress Notes (Signed)
Spiritual Care Note  Spoke with Jessica Mullins and her dtr Rod Holler after first chemo tx today.  Pt was in good spirits, noting that infusion went smoothly, and she has only three treatments left.  She desperately hopes for minimal nausea, noting that she can handle fatigue much better.  Encouraged her to keep in touch with RN/NP/MD about sx.  We built rapport over enjoyment of reading and having cats.  Family aware of ongoing chaplain availability, but please also page as needs arise/circumstances change.  Thank you.  [place], [place], [facility] Pager [phone] Voicemail  [phone]

## 2015-07-21 ENCOUNTER — Telehealth: Payer: Self-pay

## 2015-07-21 NOTE — Telephone Encounter (Signed)
Some constipation.  Went over instructions for claritin with patient.  Pt denies temps, nausea, vomiting, shortness of breath, mouth pain.  Pt reports she is taking metamucil for constipation.  Advised pt to add in colace if the metamucil does not manage it.  Confirmed clinic phone number with patient and encouraged her to call with any questions.  Pt voiced understanding.

## 2015-07-21 NOTE — Telephone Encounter (Signed)
-----   Message from Janace Hoard, RN sent at 07/20/2015  3:07 PM EST ----- Regarding: chemo follow up call [facility] Docetaxel, cytoxan, onpro neulasta. Lives by self

## 2015-07-23 ENCOUNTER — Other Ambulatory Visit: Payer: Self-pay | Admitting: Family Medicine

## 2015-07-23 ENCOUNTER — Encounter: Payer: Self-pay | Admitting: Hematology and Oncology

## 2015-07-23 NOTE — Progress Notes (Signed)
I faxed fmla forms- 07/23/15 and so did nurse on 07/22/15, copy left for patient at front desk with ms. wilma

## 2015-07-26 NOTE — Assessment & Plan Note (Signed)
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  Treatment Plan: 1. Adjuvant chemotherapy with Taxotere and Cytoxan 4 cycles 2. Followed by adjuvant radiation therapy ----------------------------------------------------------------------------------------------------------------------- Current Treatment: Cycle 1 day 8 Taxotere Cytoxan Chemo Toxicities:  RTC in 2 weeks for cycle 2

## 2015-07-27 ENCOUNTER — Other Ambulatory Visit (HOSPITAL_BASED_OUTPATIENT_CLINIC_OR_DEPARTMENT_OTHER): Payer: Medicare PPO

## 2015-07-27 ENCOUNTER — Encounter: Payer: Self-pay | Admitting: Hematology and Oncology

## 2015-07-27 ENCOUNTER — Telehealth: Payer: Self-pay | Admitting: Genetic Counselor

## 2015-07-27 ENCOUNTER — Telehealth: Payer: Self-pay | Admitting: Hematology and Oncology

## 2015-07-27 ENCOUNTER — Encounter: Payer: Self-pay | Admitting: Genetic Counselor

## 2015-07-27 ENCOUNTER — Ambulatory Visit (HOSPITAL_BASED_OUTPATIENT_CLINIC_OR_DEPARTMENT_OTHER): Payer: Medicare PPO | Admitting: Hematology and Oncology

## 2015-07-27 VITALS — BP 139/66 | HR 90 | Temp 97.8°F | Resp 18 | Ht 60.5 in | Wt 156.9 lb

## 2015-07-27 DIAGNOSIS — D72829 Elevated white blood cell count, unspecified: Secondary | ICD-10-CM

## 2015-07-27 DIAGNOSIS — C50112 Malignant neoplasm of central portion of left female breast: Secondary | ICD-10-CM | POA: Diagnosis not present

## 2015-07-27 DIAGNOSIS — Z171 Estrogen receptor negative status [ER-]: Secondary | ICD-10-CM

## 2015-07-27 DIAGNOSIS — Z1379 Encounter for other screening for genetic and chromosomal anomalies: Secondary | ICD-10-CM | POA: Insufficient documentation

## 2015-07-27 DIAGNOSIS — C773 Secondary and unspecified malignant neoplasm of axilla and upper limb lymph nodes: Secondary | ICD-10-CM | POA: Diagnosis not present

## 2015-07-27 DIAGNOSIS — R42 Dizziness and giddiness: Secondary | ICD-10-CM

## 2015-07-27 DIAGNOSIS — K5900 Constipation, unspecified: Secondary | ICD-10-CM

## 2015-07-27 LAB — COMPREHENSIVE METABOLIC PANEL
ALT: 50 U/L (ref 0–55)
AST: 38 U/L — ABNORMAL HIGH (ref 5–34)
Albumin: 3.5 g/dL (ref 3.5–5.0)
Alkaline Phosphatase: 91 U/L (ref 40–150)
Anion Gap: 8 mEq/L (ref 3–11)
BUN: 8.5 mg/dL (ref 7.0–26.0)
CO2: 28 mEq/L (ref 22–29)
Calcium: 10.1 mg/dL (ref 8.4–10.4)
Chloride: 103 mEq/L (ref 98–109)
Creatinine: 0.9 mg/dL (ref 0.6–1.1)
EGFR: 60 mL/min/{1.73_m2} — ABNORMAL LOW (ref >90)
Glucose: 99 mg/dl (ref 70–140)
Potassium: 4.3 mEq/L (ref 3.5–5.1)
Sodium: 139 mEq/L (ref 136–145)
Total Bilirubin: 0.43 mg/dL (ref 0.20–1.20)
Total Protein: 6.1 g/dL — ABNORMAL LOW (ref 6.4–8.3)

## 2015-07-27 LAB — CBC WITH DIFFERENTIAL/PLATELET
BASO%: 0.4 % (ref 0.0–2.0)
Basophils Absolute: 0.1 10*3/uL (ref 0.0–0.1)
EOS%: 0.5 % (ref 0.0–7.0)
Eosinophils Absolute: 0.1 10*3/uL (ref 0.0–0.5)
HCT: 41.1 % (ref 34.8–46.6)
HGB: 13.5 g/dL (ref 11.6–15.9)
LYMPH%: 16.6 % (ref 14.0–49.7)
MCH: 29.8 pg (ref 25.1–34.0)
MCHC: 32.9 g/dL (ref 31.5–36.0)
MCV: 90.5 fL (ref 79.5–101.0)
MONO#: 2.5 10*3/uL — ABNORMAL HIGH (ref 0.1–0.9)
MONO%: 13.2 % (ref 0.0–14.0)
NEUT#: 13 10*3/uL — ABNORMAL HIGH (ref 1.5–6.5)
NEUT%: 69.3 % (ref 38.4–76.8)
Platelets: 145 10*3/uL (ref 145–400)
RBC: 4.54 10*6/uL (ref 3.70–5.45)
RDW: 14 % (ref 11.2–14.5)
WBC: 18.7 10*3/uL — ABNORMAL HIGH (ref 3.9–10.3)
lymph#: 3.1 10*3/uL (ref 0.9–3.3)

## 2015-07-27 NOTE — Progress Notes (Signed)
Patient Care Team: Rosalita Chessman, DO as PCP - General Nicholas Lose, MD as Consulting Physician (Hematology and Oncology) Erroll Luna, MD as Consulting Physician (General Surgery)  DIAGNOSIS: No matching staging information was found for the patient.  SUMMARY OF ONCOLOGIC HISTORY:   Cancer of central portion of left female breast ([place])   05/04/2015 Mammogram  left breast distortion , breast density category A , 7 mm by ultrasound at 3:00 position middle depth   05/12/2015 Initial Diagnosis  left breast biopsy: invasive ductal cancer with DCIS , ER 0%, PR 0%, Ki-67 5%, HER-2 negative ratio 0.83   06/11/2015 Surgery Left lumpectomy: DCIS, left additional margin: IDC 1.3 cm + DCIS 1/4 LN positive, grade 2, margins negative, LVI present, ER 0%, PR 0%, HER-2 negative ratio 0.60, Ki-67 5%, T1cN1a stage II a, Mammaprint high risk luminal B   07/20/2015 -  Chemotherapy Taxotere and Cytoxan adjuvant chemotherapy  4 cycles    CHIEF COMPLIANT: Cycle 1 day 8 Taxotere and Cytoxan adjuvant chemotherapy  INTERVAL HISTORY: Jessica Mullins is a 82 year old with above-mentioned history of left breast triple negative disease with lymph node involvement who is here for nadir count check and follow-up after receiving first cycle of Taxotere and Cytoxan chemotherapy. Overall she tolerated the treatment fairly well. She did not have any nausea or vomiting. She did have bone pain related to Neulasta that lasted couple of days. She also had constipation which was improved with stool softeners. She has occasional dizziness which even had previously.  REVIEW OF SYSTEMS:   Constitutional: Denies fevers, chills or abnormal weight loss Eyes: Denies blurriness of vision Ears, nose, mouth, throat, and face: Denies mucositis or sore throat Respiratory: Denies cough, dyspnea or wheezes Cardiovascular: Denies palpitation, chest discomfort Gastrointestinal:  Denies nausea, heartburn or change in bowel habits Skin:  Denies abnormal skin rashes Lymphatics: Denies new lymphadenopathy or easy bruising Neurological:Denies numbness, tingling or new weaknesses Behavioral/Psych: Mood is stable, no new changes  Extremities: No lower extremity edema  All other systems were reviewed with the patient and are negative.  I have reviewed the past medical history, past surgical history, social history and family history with the patient and they are unchanged from previous note.  ALLERGIES:  is allergic to sulfa antibiotics; tape; glucosamine forte; and latex.  MEDICATIONS:  Current Outpatient Prescriptions  Medication Sig Dispense Refill  . alendronate (FOSAMAX) 70 MG tablet TAKE 1 TABLET (70 MG TOTAL) BY MOUTH EVERY SEVEN DAYS. TAKE WITH A FULL GLASS OF WATER AT NOON TIME ON EMPTY STOMACH. 4 tablet 11  . Biotin (BIOTIN 5000) 5 MG CAPS Take by mouth.    . Calcium Carbonate-Vitamin D (CALTRATE 600+D) 600-400 MG-UNIT per tablet Take 1 tablet by mouth daily.     . Cholecalciferol (VITAMIN D3) 1000 UNITS CAPS Take 1 capsule by mouth daily.      [name] dexamethasone (DECADRON) 4 MG tablet Take 1 tablet (4 mg total) by mouth daily. Start the day before Taxotere. Then again the day after chemo for 3 days. 30 tablet 1  . docusate sodium (COLACE) 50 MG capsule Take 50 mg by mouth 2 (two) times daily.    [name] lidocaine-prilocaine (EMLA) cream Apply to affected area once 30 g 3  . Multiple Vitamins-Minerals (MULTIVITAMIN ADULT PO) Take 1 tablet by mouth daily.    . Omega-3 Fatty Acids (FISH OIL PO) Take 1 tablet by mouth daily.     [name] omeprazole (PRILOSEC) 20 MG capsule TAKE 1 CAPSULE (20 MG  TOTAL) BY MOUTH DAILY. 30 capsule 11  . ondansetron (ZOFRAN) 8 MG tablet Take 1 tablet (8 mg total) by mouth 2 (two) times daily. Start the day after chemo for 3 days. Then take as needed for nausea or vomiting. 30 tablet 1  . pravastatin (PRAVACHOL) 40 MG tablet TAKE 1 TABLET (40 MG TOTAL) BY MOUTH AT BEDTIME. 90 tablet 0  . prochlorperazine  (COMPAZINE) 10 MG tablet Take 1 tablet (10 mg total) by mouth every 6 (six) hours as needed (Nausea or vomiting). 30 tablet 1  . Propylene Glycol 0.6 % SOLN Apply 1 drop to eye daily as needed (dry eyes).     . Psyllium (METAMUCIL PO) Take by mouth.    . RESTASIS 0.05 % ophthalmic emulsion Place 1 drop into both eyes 2 (two) times daily.    . Turmeric 500 MG CAPS Take 1 capsule by mouth daily.     . valsartan (DIOVAN) 160 MG tablet TAKE 1 TABLET (160 MG TOTAL) BY MOUTH DAILY. 90 tablet 1  . HYDROcodone-acetaminophen (NORCO) 5-325 MG tablet Take 1-2 tablets by mouth every 6 (six) hours as needed for moderate pain. (Patient not taking: Reported on 07/27/2015) 30 tablet 0  . LORazepam (ATIVAN) 0.5 MG tablet Take 1 tablet (0.5 mg total) by mouth at bedtime. (Patient not taking: Reported on 07/27/2015) 30 tablet 0   No current facility-administered medications for this visit.    PHYSICAL EXAMINATION: ECOG PERFORMANCE STATUS: 1 - Symptomatic but completely ambulatory  Filed Vitals:   07/27/15 1421  BP: 139/66  Pulse: 90  Temp: 97.8 F (36.6 C)  Resp: 18   Filed Weights   07/27/15 1421  Weight: 156 lb 14.4 oz (71.169 kg)    GENERAL:alert, no distress and comfortable SKIN: skin color, texture, turgor are normal, no rashes or significant lesions EYES: normal, Conjunctiva are pink and non-injected, sclera clear OROPHARYNX:no exudate, no erythema and lips, buccal mucosa, and tongue normal  NECK: supple, thyroid normal size, non-tender, without nodularity LYMPH:  no palpable lymphadenopathy in the cervical, axillary or inguinal LUNGS: clear to auscultation and percussion with normal breathing effort HEART: regular rate & rhythm and no murmurs and no lower extremity edema ABDOMEN:abdomen soft, non-tender and normal bowel sounds MUSCULOSKELETAL:no cyanosis of digits and no clubbing  NEURO: alert & oriented x 3 with fluent speech, no focal motor/sensory deficits EXTREMITIES: No lower extremity  edema  LABORATORY DATA:  I have reviewed the data as listed   Chemistry      Component Value Date/Time   NA 142 07/20/2015 0756   NA 143 07/15/2015 1220   K 4.3 07/20/2015 0756   K 4.4 07/15/2015 1220   CL 107 07/15/2015 1220   CO2 29 07/20/2015 0756   CO2 26 07/15/2015 1220   BUN 13.9 07/20/2015 0756   BUN 15 07/15/2015 1220   CREATININE 0.8 07/20/2015 0756   CREATININE 0.67 07/15/2015 1220      Component Value Date/Time   CALCIUM 10.4 07/20/2015 0756   CALCIUM 9.7 07/15/2015 1220   ALKPHOS 65 07/20/2015 0756   ALKPHOS 57 06/10/2015 0944   AST 19 07/20/2015 0756   AST 23 06/10/2015 0944   ALT 16 07/20/2015 0756   ALT 23 06/10/2015 0944   BILITOT 0.58 07/20/2015 0756   BILITOT 0.7 06/10/2015 0944       Lab Results  Component Value Date   WBC 18.7* 07/27/2015   HGB 13.5 07/27/2015   HCT 41.1 07/27/2015   MCV 90.5 07/27/2015  PLT 145 07/27/2015   NEUTROABS 13.0* 07/27/2015   ASSESSMENT & PLAN:  Cancer of central portion of left female breast ([place]) Left lumpectomy 06/11/2015: DCIS, left additional margin: IDC 1.3 cm + DCIS 1/4 LN positive, grade 2, margins negative, LVI present, ER 0%, PR 0%, HER-2 negative ratio 0.60, Ki-67 5%, T1cN1a stage II a , Mammaprint high risk ( 5 year risk 22%,10 year risk 29%) luminal type B, Distant metastasis free survival at 5 years 88% with chemotherapy.  Treatment Plan: 1. Adjuvant chemotherapy with Taxotere and Cytoxan 4 cycles 2. Followed by adjuvant radiation therapy ----------------------------------------------------------------------------------------------------------------------- Current Treatment: Cycle 1 day 8 Taxotere Cytoxan Chemo Toxicities: 1. Neulasta related bone pain 2. Leukocytosis related to Neulasta: I will discontinue Neulasta from cycle 2. 3. Constipation improved with stool softeners and laxatives 4. Taste changes 5. Dizziness     RTC in 2 weeks for cycle 2  No orders of the defined types were  placed in this encounter.   The patient has a good understanding of the overall plan. she agrees with it. she will call with any problems that may develop before the next visit here.   Rulon Eisenmenger, MD 07/27/2015

## 2015-07-27 NOTE — Telephone Encounter (Signed)
Negative genetic testing on the Ashkenazi Jewish Founder mutation panel.

## 2015-07-27 NOTE — Telephone Encounter (Signed)
Appointments made and avs printed and given to patient. °

## 2015-07-30 ENCOUNTER — Ambulatory Visit: Payer: Self-pay | Admitting: Genetic Counselor

## 2015-07-30 DIAGNOSIS — Z809 Family history of malignant neoplasm, unspecified: Secondary | ICD-10-CM

## 2015-07-30 DIAGNOSIS — Z1379 Encounter for other screening for genetic and chromosomal anomalies: Secondary | ICD-10-CM

## 2015-07-30 DIAGNOSIS — C50112 Malignant neoplasm of central portion of left female breast: Secondary | ICD-10-CM

## 2015-07-30 DIAGNOSIS — C439 Malignant melanoma of skin, unspecified: Secondary | ICD-10-CM

## 2015-07-30 NOTE — Progress Notes (Signed)
HPI: Ms. Frey was previously seen in the [place] clinic due to a personal history of cancer and Ashkenazi Jewish ancestry and concerns regarding a hereditary predisposition to cancer. Please refer to our prior cancer genetics clinic note for more information regarding Ms. Lofquist's medical, social and family histories, and our assessment and recommendations, at the time. Ms. Divelbiss recent genetic test results were disclosed to her, as were recommendations warranted by these results. These results and recommendations are discussed in more detail below.  FAMILY HISTORY:  We obtained a detailed, 4-generation family history.  Significant diagnoses are listed below: Family History  Problem Relation Age of Onset  . Lung cancer    . Thrombosis      thromboembolism clotting disorder--? father died of ? clot   . Cancer Mother     mets to bone--? primary  . Coronary artery disease Brother     died of M! @ 40  . Diabetes Brother   . Hyperlipidemia Brother   . Hypertension Brother   . Heart disease Brother     chf  . Heart disease Father 63    MI  . Lung cancer Sister 14    former smoker  . Diabetes Maternal Aunt     The patient has one son and one daughter who are healthy and cancer free. She had two siblings, a brother and a sister. Her sister was a former smoker and died of lung cancer. Her brother died of heart disease. Both siblings died in their 27s. The patient's mother died from bone cancer, the primary cancer is unknown, at 73. She had 6-7 siblings. Their cancer status is unknown to Ms. Boutelle. The patient's father died at 58 from a heart attack. His family is unknown to Ms. Depinto as she was 9 when he died and his siblings were older than her father. Patient's maternal ancestors are of [place] descent, and paternal ancestors are of [place] and [place] descent. There is reported Ashkenazi Jewish ancestry on the patient's maternal side.  There is no known consanguinity.  GENETIC TEST RESULTS: At the time of Ms. Bauman's visit, we recommended she pursue genetic testing of the Ashkenazi Jewish founder gene panel. The Ashkenazi Founder mutation panel performed by [facility] offers sequencing of the following three sites: BRCA1 c.68_69delAG (also known as 185delAG or 187delAGE), BRCA1 c.5266dupC (also known as 5382insC or 5385insC) and BRCA2 c.5946delT (also known as 6174delT).   The report date is July 24, 2015.  Genetic testing was normal, and did not reveal a deleterious mutation in these genes. The test report has been scanned into EPIC and is located under the Molecular Pathology section of the Results Review tab.   We discussed with Ms. Muldrew that since the current genetic testing is not perfect, it is possible there may be a gene mutation in one of these genes that current testing cannot detect, but that chance is small. We also discussed, that it is possible that another gene that has not yet been discovered, or that we have not yet tested, is responsible for the cancer diagnoses in the family, and it is, therefore, important to remain in touch with cancer genetics in the future so that we can continue to offer Ms. Deford the most up to date genetic testing.   ADDITIONAL GENETIC TESTING: We discussed with Ms. Mcelveen that there are other genes that are associated with increased cancer risk that can be analyzed. The laboratories that offer such testing look at these additional  genes via a hereditary cancer gene panel. Should Ms. Yilmaz wish to pursue additional genetic testing, we are happy to discuss and coordinate this testing, at any time.    CANCER SCREENING RECOMMENDATIONS: This result is reassuring and indicates that Ms. Theriault likely does not have an increased risk for a future cancer due to a mutation in one of these genes. This normal test also suggests that Ms. Alan's cancer was most likely not due to an  inherited predisposition associated with one of these genes.  Most cancers happen by chance and this negative test suggests that her cancer falls into this category.  We, therefore, recommended she continue to follow the cancer management and screening guidelines provided by her oncology and primary healthcare provider.   RECOMMENDATIONS FOR FAMILY MEMBERS: Women in this family might be at some increased risk of developing cancer, over the general population risk, simply due to the family history of cancer. We recommended women in this family have a yearly mammogram beginning at age 95, or 32 years younger than the earliest onset of cancer, an an annual clinical breast exam, and perform monthly breast self-exams. Women in this family should also have a gynecological exam as recommended by their primary provider. All family members should have a colonoscopy by age 73.  FOLLOW-UP: Lastly, we discussed with Ms. Hearne that cancer genetics is a rapidly advancing field and it is possible that new genetic tests will be appropriate for her and/or her family members in the future. We encouraged her to remain in contact with cancer genetics on an annual basis so we can update her personal and family histories and let her know of advances in cancer genetics that may benefit this family.   Our contact number was provided. Ms. Holte questions were answered to her satisfaction, and she knows she is welcome to call us at anytime with additional questions or concerns.   Roma Kayser, MS, [facility] Certified Genetic Counselor [email or web address]

## 2015-08-02 DIAGNOSIS — K219 Gastro-esophageal reflux disease without esophagitis: Secondary | ICD-10-CM | POA: Diagnosis not present

## 2015-08-02 DIAGNOSIS — E785 Hyperlipidemia, unspecified: Secondary | ICD-10-CM | POA: Diagnosis not present

## 2015-08-02 DIAGNOSIS — Z6830 Body mass index (BMI) 30.0-30.9, adult: Secondary | ICD-10-CM | POA: Diagnosis not present

## 2015-08-02 DIAGNOSIS — E559 Vitamin D deficiency, unspecified: Secondary | ICD-10-CM | POA: Diagnosis not present

## 2015-08-02 DIAGNOSIS — H547 Unspecified visual loss: Secondary | ICD-10-CM | POA: Diagnosis not present

## 2015-08-02 DIAGNOSIS — M810 Age-related osteoporosis without current pathological fracture: Secondary | ICD-10-CM | POA: Diagnosis not present

## 2015-08-02 DIAGNOSIS — H04129 Dry eye syndrome of unspecified lacrimal gland: Secondary | ICD-10-CM | POA: Diagnosis not present

## 2015-08-02 DIAGNOSIS — I10 Essential (primary) hypertension: Secondary | ICD-10-CM | POA: Diagnosis not present

## 2015-08-02 DIAGNOSIS — C50919 Malignant neoplasm of unspecified site of unspecified female breast: Secondary | ICD-10-CM | POA: Diagnosis not present

## 2015-08-04 ENCOUNTER — Other Ambulatory Visit: Payer: Self-pay | Admitting: *Deleted

## 2015-08-04 ENCOUNTER — Encounter: Payer: Self-pay | Admitting: General Practice

## 2015-08-04 ENCOUNTER — Other Ambulatory Visit (HOSPITAL_BASED_OUTPATIENT_CLINIC_OR_DEPARTMENT_OTHER): Payer: Medicare PPO

## 2015-08-04 ENCOUNTER — Telehealth: Payer: Self-pay | Admitting: *Deleted

## 2015-08-04 ENCOUNTER — Encounter: Payer: Self-pay | Admitting: Hematology and Oncology

## 2015-08-04 ENCOUNTER — Ambulatory Visit (HOSPITAL_BASED_OUTPATIENT_CLINIC_OR_DEPARTMENT_OTHER): Payer: Medicare PPO

## 2015-08-04 ENCOUNTER — Ambulatory Visit (HOSPITAL_BASED_OUTPATIENT_CLINIC_OR_DEPARTMENT_OTHER): Payer: Medicare PPO | Admitting: Hematology and Oncology

## 2015-08-04 VITALS — BP 92/60 | HR 66 | Temp 97.7°F | Resp 18 | Ht 60.5 in | Wt 154.6 lb

## 2015-08-04 DIAGNOSIS — C50112 Malignant neoplasm of central portion of left female breast: Secondary | ICD-10-CM

## 2015-08-04 DIAGNOSIS — I951 Orthostatic hypotension: Secondary | ICD-10-CM | POA: Diagnosis not present

## 2015-08-04 DIAGNOSIS — R42 Dizziness and giddiness: Secondary | ICD-10-CM | POA: Diagnosis not present

## 2015-08-04 DIAGNOSIS — I952 Hypotension due to drugs: Secondary | ICD-10-CM

## 2015-08-04 DIAGNOSIS — D72829 Elevated white blood cell count, unspecified: Secondary | ICD-10-CM

## 2015-08-04 LAB — CBC WITH DIFFERENTIAL/PLATELET
BASO%: 0.4 % (ref 0.0–2.0)
Basophils Absolute: 0.1 10*3/uL (ref 0.0–0.1)
EOS%: 0 % (ref 0.0–7.0)
Eosinophils Absolute: 0 10*3/uL (ref 0.0–0.5)
HCT: 35 % (ref 34.8–46.6)
HGB: 11.8 g/dL (ref 11.6–15.9)
LYMPH%: 5.6 % — ABNORMAL LOW (ref 14.0–49.7)
MCH: 29.9 pg (ref 25.1–34.0)
MCHC: 33.6 g/dL (ref 31.5–36.0)
MCV: 89 fL (ref 79.5–101.0)
MONO#: 1.9 10*3/uL — ABNORMAL HIGH (ref 0.1–0.9)
MONO%: 8.4 % (ref 0.0–14.0)
NEUT#: 19.3 10*3/uL — ABNORMAL HIGH (ref 1.5–6.5)
NEUT%: 85.6 % — ABNORMAL HIGH (ref 38.4–76.8)
Platelets: 282 10*3/uL (ref 145–400)
RBC: 3.93 10*6/uL (ref 3.70–5.45)
RDW: 14.3 % (ref 11.2–14.5)
WBC: 22.5 10*3/uL — ABNORMAL HIGH (ref 3.9–10.3)
lymph#: 1.3 10*3/uL (ref 0.9–3.3)

## 2015-08-04 LAB — COMPREHENSIVE METABOLIC PANEL
ALT: 104 U/L — ABNORMAL HIGH (ref 0–55)
AST: 71 U/L — ABNORMAL HIGH (ref 5–34)
Albumin: 2.7 g/dL — ABNORMAL LOW (ref 3.5–5.0)
Alkaline Phosphatase: 178 U/L — ABNORMAL HIGH (ref 40–150)
Anion Gap: 10 mEq/L (ref 3–11)
BUN: 15.5 mg/dL (ref 7.0–26.0)
CO2: 21 mEq/L — ABNORMAL LOW (ref 22–29)
Calcium: 9.2 mg/dL (ref 8.4–10.4)
Chloride: 102 mEq/L (ref 98–109)
Creatinine: 0.9 mg/dL (ref 0.6–1.1)
EGFR: 60 mL/min/{1.73_m2} — ABNORMAL LOW (ref >90)
Glucose: 142 mg/dl — ABNORMAL HIGH (ref 70–140)
Potassium: 4.3 mEq/L (ref 3.5–5.1)
Sodium: 133 mEq/L — ABNORMAL LOW (ref 136–145)
Total Bilirubin: 1.15 mg/dL (ref 0.20–1.20)
Total Protein: 6.4 g/dL (ref 6.4–8.3)

## 2015-08-04 LAB — MAGNESIUM: Magnesium: 2.2 mg/dl (ref 1.5–2.5)

## 2015-08-04 MED ORDER — SODIUM CHLORIDE 0.9 % IV SOLN
Freq: Once | INTRAVENOUS | Status: DC
  Administered 2015-08-04: 13:00:00 via INTRAVENOUS

## 2015-08-04 NOTE — Progress Notes (Signed)
Spiritual Care Note  Saw Jessica Mullins in infusion today, providing empathic presence and reflective listening as she shared about collapsing on her kitchen floor due to BP/dehydration issues.  She was relieved to be feeling better physically and to have BP meds adjusted; she named that [facility] staff's responsiveness and compassion have helped her feel safe and very well cared for.  She found humor and perspective in using recent car trouble as a Product/process development scientist for her current health situation. She uses chaplain support constructively and plans to reach out as needed.  Please also page as needs arise.  Thank you.  [place], [place], [facility] Pager [phone] Voicemail  [phone]

## 2015-08-04 NOTE — Assessment & Plan Note (Signed)
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  Treatment Plan: 1. Adjuvant chemotherapy with Taxotere and Cytoxan 4 cycles 2. Followed by adjuvant radiation therapy ----------------------------------------------------------------------------------------------------------------------- Current Treatment: Cycle 1 day 16 Taxotere Cytoxan Chemo Toxicities: 1. Neulasta related bone pain 2. Leukocytosis related to Neulasta: I will discontinue Neulasta from cycle 2. 3. Constipation improved with stool softeners and laxatives 4. Taste changes 5. Dizziness  6.  Alopecia 7.  Severe hypotension with dizziness and near-syncope : her blood pressure had dropped from a baseline of 773-736 systolic to 92 systolic blood pressure. She had fallen at home twice this morning. She is mildly orthostatic. She is trying her best to increase her fluid intake but in spite of this she had the symptoms. I recommended giving her 1 L of normal saline IV today. I also told her to hold off on taking any further Diovan.  If her blood pressure status low, then we may have to discontinue Diovan therapy. We will make a final decision on this next Monday when she comes back for chemotherapy.  RTC  Next Monday for cycle 2

## 2015-08-04 NOTE — Progress Notes (Signed)
Patient Care Team: Rosalita Chessman, DO as PCP - General Nicholas Lose, MD as Consulting Physician (Hematology and Oncology) Erroll Luna, MD as Consulting Physician (General Surgery)  SUMMARY OF ONCOLOGIC HISTORY:   Cancer of central portion of left female breast ([employer])   05/04/2015 Mammogram  left breast distortion , breast density category A , 7 mm by ultrasound at 3:00 position middle depth   05/12/2015 Initial Diagnosis  left breast biopsy: invasive ductal cancer with DCIS , ER 0%, PR 0%, Ki-67 5%, HER-2 negative ratio 0.83   06/11/2015 Surgery Left lumpectomy: DCIS, left additional margin: IDC 1.3 cm + DCIS 1/4 LN positive, grade 2, margins negative, LVI present, ER 0%, PR 0%, HER-2 negative ratio 0.60, Ki-67 5%, T1cN1a stage II a, Mammaprint high risk luminal B   07/20/2015 -  Chemotherapy Taxotere and Cytoxan adjuvant chemotherapy  4 cycles    CHIEF COMPLIANT:   Fell at home twice with dizziness and weakness  INTERVAL HISTORY: Jessica Mullins is a  82 year old with above-mentioned history of left breast triple negative breast cancer who is received cycle 1 of adjuvant chemotherapy with Taxotere and Cytoxan 07/20/2015. She had actually tolerated chemotherapy extremely well. She had noticed dizziness even before however this was normal for her. But this morning she felt shakiness in her legs and she fell once after which she ate her breakfast and was continuing to do other activities when she felt the same thing happen again at this point she called [place] and we are seeing her urgently in our office today. She denies any headaches or blurred vision. She denies any vertigo or clouding of the eyes before this happened. She did not lose her bowel or bladder. She was able to get up and continue her work as before. She is been trying to drink more water as well.  REVIEW OF SYSTEMS:   Constitutional:  Lower extremity weakness and falls 2 Eyes: Denies blurriness of vision Ears, nose, mouth,  throat, and face: Denies mucositis or sore throat Respiratory: Denies cough, dyspnea or wheezes Cardiovascular: Denies palpitation, chest discomfort Gastrointestinal:  Denies nausea, heartburn or change in bowel habits Skin: Denies abnormal skin rashes Lymphatics: Denies new lymphadenopathy or easy bruising Neurological:Denies numbness, tingling or new weaknesses Behavioral/Psych: Mood is stable, no new changes  Extremities: No lower extremity edema All other systems were reviewed with the patient and are negative.  I have reviewed the past medical history, past surgical history, social history and family history with the patient and they are unchanged from previous note.  ALLERGIES:  is allergic to sulfa antibiotics; tape; glucosamine forte; and latex.  MEDICATIONS:  Current Outpatient Prescriptions  Medication Sig Dispense Refill  . alendronate (FOSAMAX) 70 MG tablet TAKE 1 TABLET (70 MG TOTAL) BY MOUTH EVERY SEVEN DAYS. TAKE WITH A FULL GLASS OF WATER AT NOON TIME ON EMPTY STOMACH. 4 tablet 11  . Biotin (BIOTIN 5000) 5 MG CAPS Take by mouth.    . Calcium Carbonate-Vitamin D (CALTRATE 600+D) 600-400 MG-UNIT per tablet Take 1 tablet by mouth daily.     . Cholecalciferol (VITAMIN D3) 1000 UNITS CAPS Take 1 capsule by mouth daily.      [name] dexamethasone (DECADRON) 4 MG tablet Take 1 tablet (4 mg total) by mouth daily. Start the day before Taxotere. Then again the day after chemo for 3 days. 30 tablet 1  . docusate sodium (COLACE) 50 MG capsule Take 50 mg by mouth 2 (two) times daily.    [name] HYDROcodone-acetaminophen (  NORCO) 5-325 MG tablet Take 1-2 tablets by mouth every 6 (six) hours as needed for moderate pain. 30 tablet 0  . lidocaine-prilocaine (EMLA) cream Apply to affected area once 30 g 3  . LORazepam (ATIVAN) 0.5 MG tablet Take 1 tablet (0.5 mg total) by mouth at bedtime. 30 tablet 0  . Multiple Vitamins-Minerals (MULTIVITAMIN ADULT PO) Take 1 tablet by mouth daily.    . Omega-3 Fatty  Acids (FISH OIL PO) Take 1 tablet by mouth daily.     [name] omeprazole (PRILOSEC) 20 MG capsule TAKE 1 CAPSULE (20 MG TOTAL) BY MOUTH DAILY. 30 capsule 11  . ondansetron (ZOFRAN) 8 MG tablet Take 1 tablet (8 mg total) by mouth 2 (two) times daily. Start the day after chemo for 3 days. Then take as needed for nausea or vomiting. 30 tablet 1  . pravastatin (PRAVACHOL) 40 MG tablet TAKE 1 TABLET (40 MG TOTAL) BY MOUTH AT BEDTIME. 90 tablet 0  . prochlorperazine (COMPAZINE) 10 MG tablet Take 1 tablet (10 mg total) by mouth every 6 (six) hours as needed (Nausea or vomiting). 30 tablet 1  . Propylene Glycol 0.6 % SOLN Apply 1 drop to eye daily as needed (dry eyes).     . Psyllium (METAMUCIL PO) Take by mouth.    . RESTASIS 0.05 % ophthalmic emulsion Place 1 drop into both eyes 2 (two) times daily.    . Turmeric 500 MG CAPS Take 1 capsule by mouth daily.     . valsartan (DIOVAN) 160 MG tablet TAKE 1 TABLET (160 MG TOTAL) BY MOUTH DAILY. 90 tablet 1   No current facility-administered medications for this visit.   Facility-Administered Medications Ordered in Other Visits  Medication Dose Route Frequency Provider Last Rate Last Dose  . 0.9 %  sodium chloride infusion   Intravenous Once Nicholas Lose, MD        PHYSICAL EXAMINATION: ECOG PERFORMANCE STATUS: 1 - Symptomatic but completely ambulatory  Filed Vitals:   08/04/15 1119 08/04/15 1120  BP: 94/66 92/60  Pulse:    Temp:    Resp:     Filed Weights   08/04/15 1118  Weight: 154 lb 9.6 oz (70.126 kg)    GENERAL:alert, no distress and comfortable SKIN: skin color, texture, turgor are normal, no rashes or significant lesions EYES: normal, Conjunctiva are pink and non-injected, sclera clear OROPHARYNX:no exudate, no erythema and lips, buccal mucosa, and tongue normal  NECK: supple, thyroid normal size, non-tender, without nodularity LYMPH:  no palpable lymphadenopathy in the cervical, axillary or inguinal LUNGS: clear to auscultation and  percussion with normal breathing effort HEART: regular rate & rhythm and no murmurs and no lower extremity edema ABDOMEN:abdomen soft, non-tender and normal bowel sounds MUSCULOSKELETAL:no cyanosis of digits and no clubbing  NEURO: alert & oriented x 3 with fluent speech, no focal motor/sensory deficits EXTREMITIES: No lower extremity edema   LABORATORY DATA:  I have reviewed the data as listed   Chemistry      Component Value Date/Time   NA 133* 08/04/2015 1101   NA 143 07/15/2015 1220   K 4.3 08/04/2015 1101   K 4.4 07/15/2015 1220   CL 107 07/15/2015 1220   CO2 21* 08/04/2015 1101   CO2 26 07/15/2015 1220   BUN 15.5 08/04/2015 1101   BUN 15 07/15/2015 1220   CREATININE 0.9 08/04/2015 1101   CREATININE 0.67 07/15/2015 1220      Component Value Date/Time   CALCIUM 9.2 08/04/2015 1101   CALCIUM 9.7 07/15/2015  1220   ALKPHOS 178* 08/04/2015 1101   ALKPHOS 57 06/10/2015 0944   AST 71* 08/04/2015 1101   AST 23 06/10/2015 0944   ALT 104* 08/04/2015 1101   ALT 23 06/10/2015 0944   BILITOT 1.15 08/04/2015 1101   BILITOT 0.7 06/10/2015 0944       Lab Results  Component Value Date   WBC 22.5* 08/04/2015   HGB 11.8 08/04/2015   HCT 35.0 08/04/2015   MCV 89.0 08/04/2015   PLT 282 08/04/2015   NEUTROABS 19.3* 08/04/2015     ASSESSMENT & PLAN:  Cancer of central portion of left female breast ([place]) Left lumpectomy 06/11/2015: DCIS, left additional margin: IDC 1.3 cm + DCIS 1/4 LN positive, grade 2, margins negative, LVI present, ER 0%, PR 0%, HER-2 negative ratio 0.60, Ki-67 5%, T1cN1a stage II a , Mammaprint high risk ( 5 year risk 22%,10 year risk 29%) luminal type B, Distant metastasis free survival at 5 years 88% with chemotherapy.  Treatment Plan: 1. Adjuvant chemotherapy with Taxotere and Cytoxan 4 cycles 2. Followed by adjuvant radiation  therapy ----------------------------------------------------------------------------------------------------------------------- Current Treatment: Cycle 1 day 16 Taxotere Cytoxan Chemo Toxicities: 1. Neulasta related bone pain 2. Leukocytosis related to Neulasta: I will discontinue Neulasta from cycle 2. 3. Constipation improved with stool softeners and laxatives 4. Taste changes 5. Dizziness  6.  Alopecia 7.  Severe hypotension with dizziness and near-syncope : her blood pressure had dropped from a baseline of 332-951 systolic to 92 systolic blood pressure. She had fallen at home twice this morning. She is mildly orthostatic. She is trying her best to increase her fluid intake but in spite of this she had the symptoms. I recommended giving her 1 L of normal saline IV today. I also told her to hold off on taking any further Diovan.  If her blood pressure stays low, then we may have to discontinue Diovan therapy. We will make a final decision on this next Monday when she comes back for chemotherapy.  RTC  Next Monday for cycle 2  No orders of the defined types were placed in this encounter.   The patient has a good understanding of the overall plan. she agrees with it. she will call with any problems that may develop before the next visit here.   Rulon Eisenmenger, MD 08/04/2015

## 2015-08-04 NOTE — Telephone Encounter (Signed)
Received call from patient stating she became really shaky this am and then fell to the floor.  She states this happened twice this am.  She states she never passed out or lost consciousness.  Denies fever or headaches.  She will come in now and get labs, see Dr. Lindi Adie, and possibly IVF's.  Appts. Made and patient aware.

## 2015-08-04 NOTE — Patient Instructions (Signed)

## 2015-08-09 NOTE — Assessment & Plan Note (Signed)
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  Treatment Plan: 1. Adjuvant chemotherapy with Taxotere and Cytoxan 4 cycles 2. Followed by adjuvant radiation therapy ----------------------------------------------------------------------------------------------------------------------- Current Treatment: Cycle 2 day 1 Taxotere Cytoxan Chemo Toxicities: 1. Neulasta related bone pain 2. Leukocytosis related to Neulasta: I will discontinue Neulasta from cycle 2. 3. Constipation improved with stool softeners and laxatives 4. Taste changes 5. Dizziness  6. Alopecia 7. Severe hypotension with dizziness and near-syncope  RTC in 3 weeks for cycle 2

## 2015-08-10 ENCOUNTER — Encounter: Payer: Self-pay | Admitting: *Deleted

## 2015-08-10 ENCOUNTER — Other Ambulatory Visit (HOSPITAL_BASED_OUTPATIENT_CLINIC_OR_DEPARTMENT_OTHER): Payer: Medicare PPO

## 2015-08-10 ENCOUNTER — Telehealth (HOSPITAL_COMMUNITY): Payer: Self-pay | Admitting: Vascular Surgery

## 2015-08-10 ENCOUNTER — Other Ambulatory Visit: Payer: Self-pay

## 2015-08-10 ENCOUNTER — Ambulatory Visit (HOSPITAL_BASED_OUTPATIENT_CLINIC_OR_DEPARTMENT_OTHER): Payer: Medicare PPO | Admitting: Hematology and Oncology

## 2015-08-10 ENCOUNTER — Ambulatory Visit (HOSPITAL_BASED_OUTPATIENT_CLINIC_OR_DEPARTMENT_OTHER): Payer: Medicare PPO

## 2015-08-10 ENCOUNTER — Encounter: Payer: Self-pay | Admitting: Hematology and Oncology

## 2015-08-10 VITALS — BP 115/78 | HR 90 | Temp 97.7°F | Resp 18 | Ht 60.5 in | Wt 156.9 lb

## 2015-08-10 VITALS — BP 132/78 | HR 76 | Temp 97.6°F | Resp 12

## 2015-08-10 DIAGNOSIS — I4891 Unspecified atrial fibrillation: Secondary | ICD-10-CM | POA: Insufficient documentation

## 2015-08-10 DIAGNOSIS — I481 Persistent atrial fibrillation: Secondary | ICD-10-CM | POA: Diagnosis not present

## 2015-08-10 DIAGNOSIS — I951 Orthostatic hypotension: Secondary | ICD-10-CM

## 2015-08-10 DIAGNOSIS — C50112 Malignant neoplasm of central portion of left female breast: Secondary | ICD-10-CM

## 2015-08-10 DIAGNOSIS — I959 Hypotension, unspecified: Secondary | ICD-10-CM

## 2015-08-10 DIAGNOSIS — I10 Essential (primary) hypertension: Secondary | ICD-10-CM

## 2015-08-10 DIAGNOSIS — Z5111 Encounter for antineoplastic chemotherapy: Secondary | ICD-10-CM

## 2015-08-10 DIAGNOSIS — R42 Dizziness and giddiness: Secondary | ICD-10-CM

## 2015-08-10 DIAGNOSIS — Z171 Estrogen receptor negative status [ER-]: Secondary | ICD-10-CM

## 2015-08-10 DIAGNOSIS — I4819 Other persistent atrial fibrillation: Secondary | ICD-10-CM

## 2015-08-10 LAB — CBC WITH DIFFERENTIAL/PLATELET
BASO%: 0.4 % (ref 0.0–2.0)
Basophils Absolute: 0.1 10*3/uL (ref 0.0–0.1)
EOS%: 0 % (ref 0.0–7.0)
Eosinophils Absolute: 0 10*3/uL (ref 0.0–0.5)
HCT: 31.2 % — ABNORMAL LOW (ref 34.8–46.6)
HGB: 10.4 g/dL — ABNORMAL LOW (ref 11.6–15.9)
LYMPH%: 11.1 % — ABNORMAL LOW (ref 14.0–49.7)
MCH: 30.6 pg (ref 25.1–34.0)
MCHC: 33.4 g/dL (ref 31.5–36.0)
MCV: 91.7 fL (ref 79.5–101.0)
MONO#: 1.4 10*3/uL — ABNORMAL HIGH (ref 0.1–0.9)
MONO%: 9.3 % (ref 0.0–14.0)
NEUT#: 12.1 10*3/uL — ABNORMAL HIGH (ref 1.5–6.5)
NEUT%: 79.2 % — ABNORMAL HIGH (ref 38.4–76.8)
Platelets: 659 10*3/uL — ABNORMAL HIGH (ref 145–400)
RBC: 3.4 10*6/uL — ABNORMAL LOW (ref 3.70–5.45)
RDW: 14.2 % (ref 11.2–14.5)
WBC: 15.3 10*3/uL — ABNORMAL HIGH (ref 3.9–10.3)
lymph#: 1.7 10*3/uL (ref 0.9–3.3)

## 2015-08-10 LAB — COMPREHENSIVE METABOLIC PANEL
ALT: 144 U/L — ABNORMAL HIGH (ref 0–55)
AST: 45 U/L — ABNORMAL HIGH (ref 5–34)
Albumin: 2.8 g/dL — ABNORMAL LOW (ref 3.5–5.0)
Alkaline Phosphatase: 240 U/L — ABNORMAL HIGH (ref 40–150)
Anion Gap: 11 mEq/L (ref 3–11)
BUN: 16.1 mg/dL (ref 7.0–26.0)
CO2: 28 mEq/L (ref 22–29)
Calcium: 10.6 mg/dL — ABNORMAL HIGH (ref 8.4–10.4)
Chloride: 102 mEq/L (ref 98–109)
Creatinine: 0.8 mg/dL (ref 0.6–1.1)
EGFR: 70 mL/min/{1.73_m2} — ABNORMAL LOW (ref >90)
Glucose: 96 mg/dl (ref 70–140)
Potassium: 4.5 mEq/L (ref 3.5–5.1)
Sodium: 141 mEq/L (ref 136–145)
Total Bilirubin: 0.68 mg/dL (ref 0.20–1.20)
Total Protein: 6.7 g/dL (ref 6.4–8.3)

## 2015-08-10 MED ORDER — SODIUM CHLORIDE 0.9 % IV SOLN
Freq: Once | INTRAVENOUS | Status: AC
  Administered 2015-08-10: 10:00:00 via INTRAVENOUS

## 2015-08-10 MED ORDER — APIXABAN 5 MG PO TABS: 5.0000 mg | ORAL_TABLET | Freq: Two times a day (BID) | ORAL | Status: DC

## 2015-08-10 MED ORDER — DOCETAXEL CHEMO INJECTION 160 MG/16ML
55.0000 mg/m2 | Freq: Once | INTRAVENOUS | Status: AC
  Administered 2015-08-10: 100 mg via INTRAVENOUS
  Filled 2015-08-10: qty 10

## 2015-08-10 MED ORDER — SODIUM CHLORIDE 0.9 % IJ SOLN
10.0000 mL | INTRAMUSCULAR | Status: DC | PRN
  Administered 2015-08-10: 10 mL
  Filled 2015-08-10: qty 10

## 2015-08-10 MED ORDER — SODIUM CHLORIDE 0.9 % IV SOLN
500.0000 mg/m2 | Freq: Once | INTRAVENOUS | Status: AC
  Administered 2015-08-10: 880 mg via INTRAVENOUS
  Filled 2015-08-10: qty 44

## 2015-08-10 MED ORDER — SODIUM CHLORIDE 0.9 % IV SOLN
Freq: Once | INTRAVENOUS | Status: AC
  Administered 2015-08-10: 10:00:00 via INTRAVENOUS
  Filled 2015-08-10: qty 1

## 2015-08-10 MED ORDER — PALONOSETRON HCL INJECTION 0.25 MG/5ML
0.2500 mg | Freq: Once | INTRAVENOUS | Status: AC
  Administered 2015-08-10: 0.25 mg via INTRAVENOUS

## 2015-08-10 MED ORDER — PALONOSETRON HCL INJECTION 0.25 MG/5ML
INTRAVENOUS | Status: AC
  Filled 2015-08-10: qty 5

## 2015-08-10 MED ORDER — HEPARIN SOD (PORK) LOCK FLUSH 100 UNIT/ML IV SOLN
500.0000 [IU] | Freq: Once | INTRAVENOUS | Status: AC | PRN
  Administered 2015-08-10: 500 [IU]
  Filled 2015-08-10: qty 5

## 2015-08-10 NOTE — Telephone Encounter (Signed)
Left pt a detailed message to call to make an appt w/ DB on Wed

## 2015-08-10 NOTE — Progress Notes (Signed)
Patient Care Team: Rosalita Chessman, DO as PCP - General Nicholas Lose, MD as Consulting Physician (Hematology and Oncology) Erroll Luna, MD as Consulting Physician (General Surgery)  SUMMARY OF ONCOLOGIC HISTORY:   Cancer of central portion of left female breast ([place])   05/04/2015 Mammogram  left breast distortion , breast density category A , 7 mm by ultrasound at 3:00 position middle depth   05/12/2015 Initial Diagnosis  left breast biopsy: invasive ductal cancer with DCIS , ER 0%, PR 0%, Ki-67 5%, HER-2 negative ratio 0.83   06/11/2015 Surgery Left lumpectomy: DCIS, left additional margin: IDC 1.3 cm + DCIS 1/4 LN positive, grade 2, margins negative, LVI present, ER 0%, PR 0%, HER-2 negative ratio 0.60, Ki-67 5%, T1cN1a stage II a, Mammaprint high risk luminal B   07/20/2015 -  Chemotherapy Taxotere and Cytoxan adjuvant chemotherapy  4 cycles    CHIEF COMPLIANT: Cycle 2 day 1 Taxotere and Cytoxan, continued dizziness  INTERVAL HISTORY: Jessica Mullins is a 82 year old with above-mentioned history of triple negative left breast cancer who was in excellent Performance status after much discussion elected to go on adjuvant chemotherapy. She received first cycle of Taxotere and Cytoxan and had to come in and received IV fluids because of blood pressure went down into the systolic of 45W and she felt dizzy. She even fell once at home. We had to give her IV fluids once again. Since then she has been feeling a lot better. It is unclear whether the cause of dizziness is related to the hypotension or something in her ear disorder. But because her normal blood pressure runs between 388-828 systolic, we felt that the dizziness and the syncope could be related to low blood pressure. She is currently off her blood pressure medication.  REVIEW OF SYSTEMS:   Constitutional: Denies fevers, chills or abnormal weight loss, alopecia Eyes: Denies blurriness of vision Ears, nose, mouth, throat, and face:  Denies mucositis or sore throat Respiratory: Denies cough, dyspnea or wheezes Cardiovascular: Denies palpitation, chest discomfort Gastrointestinal:  Denies nausea, heartburn or change in bowel habits Skin: Denies abnormal skin rashes Lymphatics: Denies new lymphadenopathy or easy bruising Neurological: Complains of dizziness Behavioral/Psych: Mood is stable, no new changes  Extremities: Right shoulder pain All other systems were reviewed with the patient and are negative.  I have reviewed the past medical history, past surgical history, social history and family history with the patient and they are unchanged from previous note.  ALLERGIES:  is allergic to sulfa antibiotics; tape; glucosamine forte; and latex.  MEDICATIONS:  Current Outpatient Prescriptions  Medication Sig Dispense Refill  . alendronate (FOSAMAX) 70 MG tablet TAKE 1 TABLET (70 MG TOTAL) BY MOUTH EVERY SEVEN DAYS. TAKE WITH A FULL GLASS OF WATER AT NOON TIME ON EMPTY STOMACH. 4 tablet 11  . apixaban (ELIQUIS) 5 MG TABS tablet Take 1 tablet (5 mg total) by mouth 2 (two) times daily. 60 tablet 3  . Biotin (BIOTIN 5000) 5 MG CAPS Take by mouth.    . Calcium Carbonate-Vitamin D (CALTRATE 600+D) 600-400 MG-UNIT per tablet Take 1 tablet by mouth daily.     . Cholecalciferol (VITAMIN D3) 1000 UNITS CAPS Take 1 capsule by mouth daily.      [name] dexamethasone (DECADRON) 4 MG tablet Take 1 tablet (4 mg total) by mouth daily. Start the day before Taxotere. Then again the day after chemo for 3 days. 30 tablet 1  . docusate sodium (COLACE) 50 MG capsule Take 50 mg by  mouth 2 (two) times daily.    [name] HYDROcodone-acetaminophen (NORCO) 5-325 MG tablet Take 1-2 tablets by mouth every 6 (six) hours as needed for moderate pain. 30 tablet 0  . lidocaine-prilocaine (EMLA) cream Apply to affected area once 30 g 3  . LORazepam (ATIVAN) 0.5 MG tablet Take 1 tablet (0.5 mg total) by mouth at bedtime. 30 tablet 0  . Multiple Vitamins-Minerals  (MULTIVITAMIN ADULT PO) Take 1 tablet by mouth daily.    . Omega-3 Fatty Acids (FISH OIL PO) Take 1 tablet by mouth daily.     [name] omeprazole (PRILOSEC) 20 MG capsule TAKE 1 CAPSULE (20 MG TOTAL) BY MOUTH DAILY. 30 capsule 11  . ondansetron (ZOFRAN) 8 MG tablet Take 1 tablet (8 mg total) by mouth 2 (two) times daily. Start the day after chemo for 3 days. Then take as needed for nausea or vomiting. 30 tablet 1  . pravastatin (PRAVACHOL) 40 MG tablet TAKE 1 TABLET (40 MG TOTAL) BY MOUTH AT BEDTIME. 90 tablet 0  . prochlorperazine (COMPAZINE) 10 MG tablet Take 1 tablet (10 mg total) by mouth every 6 (six) hours as needed (Nausea or vomiting). 30 tablet 1  . Propylene Glycol 0.6 % SOLN Apply 1 drop to eye daily as needed (dry eyes).     . Psyllium (METAMUCIL PO) Take by mouth.    . RESTASIS 0.05 % ophthalmic emulsion Place 1 drop into both eyes 2 (two) times daily.    . Turmeric 500 MG CAPS Take 1 capsule by mouth daily.     . valsartan (DIOVAN) 160 MG tablet TAKE 1 TABLET (160 MG TOTAL) BY MOUTH DAILY. 90 tablet 1   No current facility-administered medications for this visit.   Facility-Administered Medications Ordered in Other Visits  Medication Dose Route Frequency Provider Last Rate Last Dose  . cyclophosphamide (CYTOXAN) 880 mg in sodium chloride 0.9 % 250 mL chemo infusion  500 mg/m2 (Treatment Plan Actual) Intravenous Once Nicholas Lose, MD 588 mL/hr at 08/10/15 1238 880 mg at 08/10/15 1238  . heparin lock flush 100 unit/mL  500 Units Intracatheter Once PRN Nicholas Lose, MD      . sodium chloride 0.9 % injection 10 mL  10 mL Intracatheter PRN Nicholas Lose, MD        PHYSICAL EXAMINATION: ECOG PERFORMANCE STATUS: 1 - Symptomatic but completely ambulatory  Filed Vitals:   08/10/15 0824  BP: 115/78  Pulse: 90  Temp: 97.7 F (36.5 C)  Resp: 18   Filed Weights   08/10/15 0824  Weight: 156 lb 14.4 oz (71.169 kg)    GENERAL:alert, no distress and comfortable SKIN: skin color, texture,  turgor are normal, no rashes or significant lesions EYES: normal, Conjunctiva are pink and non-injected, sclera clear OROPHARYNX:no exudate, no erythema and lips, buccal mucosa, and tongue normal  NECK: supple, thyroid normal size, non-tender, without nodularity LYMPH:  no palpable lymphadenopathy in the cervical, axillary or inguinal LUNGS: clear to auscultation and percussion with normal breathing effort HEART: regular rate & rhythm and no murmurs and no lower extremity edema ABDOMEN:abdomen soft, non-tender and normal bowel sounds MUSCULOSKELETAL:no cyanosis of digits and no clubbing  NEURO: alert & oriented x 3 with fluent speech, no focal motor/sensory deficits EXTREMITIES: No lower extremity edema LABORATORY DATA:  I have reviewed the data as listed   Chemistry      Component Value Date/Time   NA 141 08/10/2015 0809   NA 143 07/15/2015 1220   K 4.5 08/10/2015 0809   K 4.4  07/15/2015 1220   CL 107 07/15/2015 1220   CO2 28 08/10/2015 0809   CO2 26 07/15/2015 1220   BUN 16.1 08/10/2015 0809   BUN 15 07/15/2015 1220   CREATININE 0.8 08/10/2015 0809   CREATININE 0.67 07/15/2015 1220      Component Value Date/Time   CALCIUM 10.6* 08/10/2015 0809   CALCIUM 9.7 07/15/2015 1220   ALKPHOS 240* 08/10/2015 0809   ALKPHOS 57 06/10/2015 0944   AST 45* 08/10/2015 0809   AST 23 06/10/2015 0944   ALT 144* 08/10/2015 0809   ALT 23 06/10/2015 0944   BILITOT 0.68 08/10/2015 0809   BILITOT 0.7 06/10/2015 0944       Lab Results  Component Value Date   WBC 15.3* 08/10/2015   HGB 10.4* 08/10/2015   HCT 31.2* 08/10/2015   MCV 91.7 08/10/2015   PLT 659* 08/10/2015   NEUTROABS 12.1* 08/10/2015     ASSESSMENT & PLAN:  Cancer of central portion of left female breast ([place]) Left lumpectomy 06/11/2015: DCIS, left additional margin: IDC 1.3 cm + DCIS 1/4 LN positive, grade 2, margins negative, LVI present, ER 0%, PR 0%, HER-2 negative ratio 0.60, Ki-67 5%, T1cN1a stage II a , Mammaprint  high risk ( 5 year risk 22%,10 year risk 29%) luminal type B, Distant metastasis free survival at 5 years 88% with chemotherapy.  Treatment Plan: 1. Adjuvant chemotherapy with Taxotere and Cytoxan 4 cycles 2. Followed by adjuvant radiation therapy ----------------------------------------------------------------------------------------------------------------------- Current Treatment: Cycle 2 day 1 Taxotere Cytoxan Chemo Toxicities: 1. Neulasta related bone pain 2. Leukocytosis related to Neulasta: I will discontinue Neulasta from cycle 2. 3. Constipation improved with stool softeners and laxatives 4. Taste changes 5. Dizziness  6. Alopecia 7. Severe hypotension with dizziness and near-syncope: Required IV fluids 8. New onset atrial fibrillation 08/10/2015:  heart rate was 86. I suspect this is the cause of her dizziness. I discussed the case with Dr. Haroldine Laws. I would like to refer her for evaluation of atrial fibrillation. Based on our discussion, I will start her today on anti-coagulation with the Eliquis 5 mg by mouth twice a day. He may consider doing cardioversion on her. We elected to continue with the chemotherapy.   RTC in 1 week for fluids if needed   Orders Placed This Encounter  Procedures  . Ambulatory referral to Cardiology    Referral Priority:  Routine    Referral Type:  Consultation    Referral Reason:  Specialty Services Required    Referred to Provider:  Jolaine Artist, MD    Requested Specialty:  Cardiology    Number of Visits Requested:  1   The patient has a good understanding of the overall plan. she agrees with it. she will call with any problems that may develop before the next visit here.   Rulon Eisenmenger, MD 08/10/2015

## 2015-08-10 NOTE — Progress Notes (Signed)
Unable to get in to exam room prior to MD.  No assessment performed.  Per Dr. Lindi Adie, ok to treat with 08/10/15 lab values.  Dose to be reduced.

## 2015-08-10 NOTE — Patient Instructions (Signed)
Jessica Mullins Discharge Instructions for Patients Receiving Chemotherapy  Today you received the following chemotherapy agents :  Taxotere/Cytoxan  To help prevent nausea and vomiting after your treatment, we encourage you to take your nausea medication HOME. If you develop nausea and vomiting that is not controlled by your nausea medication, call the clinic.   BELOW ARE SYMPTOMS THAT SHOULD BE REPORTED IMMEDIATELY:  *FEVER GREATER THAN 100.5 F  *CHILLS WITH OR WITHOUT FEVER  NAUSEA AND VOMITING THAT IS NOT CONTROLLED WITH YOUR NAUSEA MEDICATION  *UNUSUAL SHORTNESS OF BREATH  *UNUSUAL BRUISING OR BLEEDING  TENDERNESS IN MOUTH AND THROAT WITH OR WITHOUT PRESENCE OF ULCERS  *URINARY PROBLEMS  *BOWEL PROBLEMS  UNUSUAL RASH Items with * indicate a potential emergency and should be followed up as soon as possible.  Feel free to call the clinic you have any questions or concerns. The clinic phone number is [phone].  Please show the [facility] at check-in to the Emergency Department and triage nurse.

## 2015-08-11 ENCOUNTER — Other Ambulatory Visit: Payer: Self-pay | Admitting: *Deleted

## 2015-08-12 ENCOUNTER — Encounter (HOSPITAL_COMMUNITY): Payer: Self-pay | Admitting: Internal Medicine

## 2015-08-12 ENCOUNTER — Ambulatory Visit (HOSPITAL_COMMUNITY)
Admission: RE | Admit: 2015-08-12 | Discharge: 2015-08-12 | Disposition: A | Discharge status: Home / Self Care | Payer: Medicare PPO | Source: Ambulatory Visit | Attending: Internal Medicine | Admitting: Internal Medicine

## 2015-08-12 ENCOUNTER — Ambulatory Visit (HOSPITAL_BASED_OUTPATIENT_CLINIC_OR_DEPARTMENT_OTHER)
Admission: RE | Admit: 2015-08-12 | Discharge: 2015-08-12 | Disposition: A | Discharge status: Home / Self Care | Payer: Medicare PPO | Source: Ambulatory Visit | Attending: Internal Medicine | Admitting: Internal Medicine

## 2015-08-12 ENCOUNTER — Other Ambulatory Visit (HOSPITAL_COMMUNITY): Payer: Self-pay | Admitting: *Deleted

## 2015-08-12 ENCOUNTER — Encounter (HOSPITAL_COMMUNITY): Payer: Self-pay | Admitting: *Deleted

## 2015-08-12 ENCOUNTER — Other Ambulatory Visit: Payer: Self-pay | Admitting: Internal Medicine

## 2015-08-12 VITALS — BP 114/68 | HR 133 | Wt 159.0 lb

## 2015-08-12 DIAGNOSIS — I4891 Unspecified atrial fibrillation: Secondary | ICD-10-CM | POA: Insufficient documentation

## 2015-08-12 DIAGNOSIS — Z8249 Family history of ischemic heart disease and other diseases of the circulatory system: Secondary | ICD-10-CM | POA: Insufficient documentation

## 2015-08-12 DIAGNOSIS — I10 Essential (primary) hypertension: Secondary | ICD-10-CM | POA: Insufficient documentation

## 2015-08-12 DIAGNOSIS — Z833 Family history of diabetes mellitus: Secondary | ICD-10-CM | POA: Diagnosis not present

## 2015-08-12 DIAGNOSIS — Z91040 Latex allergy status: Secondary | ICD-10-CM | POA: Insufficient documentation

## 2015-08-12 DIAGNOSIS — C50919 Malignant neoplasm of unspecified site of unspecified female breast: Secondary | ICD-10-CM | POA: Insufficient documentation

## 2015-08-12 DIAGNOSIS — I5021 Acute systolic (congestive) heart failure: Secondary | ICD-10-CM

## 2015-08-12 DIAGNOSIS — E785 Hyperlipidemia, unspecified: Secondary | ICD-10-CM | POA: Insufficient documentation

## 2015-08-12 DIAGNOSIS — Z79899 Other long term (current) drug therapy: Secondary | ICD-10-CM | POA: Insufficient documentation

## 2015-08-12 DIAGNOSIS — Z85828 Personal history of other malignant neoplasm of skin: Secondary | ICD-10-CM | POA: Insufficient documentation

## 2015-08-12 DIAGNOSIS — Z7983 Long term (current) use of bisphosphonates: Secondary | ICD-10-CM | POA: Insufficient documentation

## 2015-08-12 DIAGNOSIS — Z7902 Long term (current) use of antithrombotics/antiplatelets: Secondary | ICD-10-CM | POA: Diagnosis not present

## 2015-08-12 DIAGNOSIS — Z882 Allergy status to sulfonamides status: Secondary | ICD-10-CM | POA: Diagnosis not present

## 2015-08-12 DIAGNOSIS — K219 Gastro-esophageal reflux disease without esophagitis: Secondary | ICD-10-CM | POA: Insufficient documentation

## 2015-08-12 DIAGNOSIS — Z85820 Personal history of malignant melanoma of skin: Secondary | ICD-10-CM | POA: Diagnosis not present

## 2015-08-12 DIAGNOSIS — I481 Persistent atrial fibrillation: Secondary | ICD-10-CM | POA: Diagnosis not present

## 2015-08-12 DIAGNOSIS — I480 Paroxysmal atrial fibrillation: Secondary | ICD-10-CM | POA: Diagnosis not present

## 2015-08-12 DIAGNOSIS — I4819 Other persistent atrial fibrillation: Secondary | ICD-10-CM

## 2015-08-12 MED ORDER — DILTIAZEM HCL ER COATED BEADS 180 MG PO CP24: 180.0000 mg | ORAL_CAPSULE | Freq: Every day | ORAL | Status: DC

## 2015-08-12 NOTE — Addendum Note (Signed)
Encounter addended by: Scarlette Calico, RN on: 08/12/2015 10:53 AM<BR>     Documentation filed: Visit Diagnoses, Orders, Dx Association, Patient Instructions Section

## 2015-08-12 NOTE — Progress Notes (Signed)
Patient ID: Jessica Mullins, female   DOB: 01/02/1931, 82 y.o.   MRN: [id number]  CARDIOLOGY CLINIC CONSULT NOTE  Referring Physician: Lindi Adie Primary Care: Etter Sjogren Primary Cardiologist: None  HPI:  Ahniyah is a an 82 y.o woman with h/o HTN, HL and left breast CA. No known heart disease. Referred by Dr. Lindi Adie for further evaluation of new onset AF.  Denies h/o AF. Diagnosed with breast CA in 11/17. Underwent lumpectomy in 12/16. Had 2/4 rounds of chemo so far with taxotere and Cytoxan last dose Monday.  Last Tuesday she felt weak and had syncopal episode. Went to Dr. Geralyn Flash office at that time and BP was quite low (HR not recorded) and was felt to be volume depleted. Denies CP or SOB. Denies palpitations. Came in for chemo on this Monday and found to be in AF which was asymptomatic. On ECG rate was 119. Started on Eliquis.   Says she felt OK but now is tired after the chemo. Denies SOB, CP or palpitations. Says her balance is bad and this makes it worse. No edema. Has not had echo or thyroid testing yet. Does not know if she snores. Normally she is very active without problem. Denies ETOH.     Review of Systems: [y] = yes, [ ]  = no   General: Weight gain [ ] ; Weight loss [ ] ; Anorexia [ ] ; Fatigue [ y]; Fever [ ] ; Chills [ ] ; Weakness [ ]   Cardiac: Chest pain/pressure [ ] ; Resting SOB [ ] ; Exertional SOB [ ] ; Orthopnea [ ] ; Pedal Edema [ ] ; Palpitations [ ] ; Syncope [ ] ; Presyncope [ ] ; Paroxysmal nocturnal dyspnea[ ]   Pulmonary: Cough [ ] ; Wheezing[ ] ; Hemoptysis[ ] ; Sputum [ ] ; Snoring [ ]   GI: Vomiting[ ] ; Dysphagia[ ] ; Melena[ ] ; Hematochezia [ ] ; Heartburn[ ] ; Abdominal pain [ ] ; Constipation [ ] ; Diarrhea [ ] ; BRBPR [ ]   GU: Hematuria[ ] ; Dysuria [ ] ; Nocturia[ ]   Vascular: Pain in legs with walking [ ] ; Pain in feet with lying flat [ ] ; Non-healing sores [ ] ; Stroke [ ] ; TIA [ ] ; Slurred speech [ ] ;  Neuro: Headaches[ ] ; Vertigo[ ] ; Seizures[ ] ; Paresthesias[ ] ;Blurred vision [ ] ;  Diplopia [ ] ; Vision changes [ ]   Ortho/Skin: Arthritis Blue.Reese ]; Joint pain [ ] ; Muscle pain [ ] ; Joint swelling [ ] ; Back Pain [ ] ; Rash [ ]   Psych: Depression[ ] ; Anxiety[ ]   Heme: Bleeding problems [ ] ; Clotting disorders [ ] ; Anemia [ ]   Endocrine: Diabetes [ ] ; Thyroid dysfunction[ ]    Past Medical History  Diagnosis Date  . Hypertension   . Hyperlipidemia   . Bursitis of right shoulder   . Cataract   . Skin cancer 2000    melanoma and basal cell  . Breast cancer ([place]) 1115/16    left   . Allergy   . Vaso vagal episode     during preparation for colonoscopy  . GERD (gastroesophageal reflux disease)   . Fracture of right wrist   . Ankle fracture, left   . Family history of cancer     Current Outpatient Prescriptions  Medication Sig Dispense Refill  . alendronate (FOSAMAX) 70 MG tablet TAKE 1 TABLET (70 MG TOTAL) BY MOUTH EVERY SEVEN DAYS. TAKE WITH A FULL GLASS OF WATER AT NOON TIME ON EMPTY STOMACH. 4 tablet 11  . apixaban (ELIQUIS) 5 MG TABS tablet Take 1 tablet (5 mg total) by mouth 2 (two) times daily. 60 tablet 3  . Biotin (  BIOTIN 5000) 5 MG CAPS Take by mouth.    . Calcium Carbonate-Vitamin D (CALTRATE 600+D) 600-400 MG-UNIT per tablet Take 1 tablet by mouth daily.     . Cholecalciferol (VITAMIN D3) 1000 UNITS CAPS Take 1 capsule by mouth daily.      [name] lidocaine-prilocaine (EMLA) cream Apply to affected area once 30 g 3  . Multiple Vitamins-Minerals (MULTIVITAMIN ADULT PO) Take 1 tablet by mouth daily.    . Omega-3 Fatty Acids (FISH OIL PO) Take 1 tablet by mouth daily.     [name] omeprazole (PRILOSEC) 20 MG capsule TAKE 1 CAPSULE (20 MG TOTAL) BY MOUTH DAILY. 30 capsule 11  . pravastatin (PRAVACHOL) 40 MG tablet TAKE 1 TABLET (40 MG TOTAL) BY MOUTH AT BEDTIME. 90 tablet 0  . Propylene Glycol 0.6 % SOLN Apply 1 drop to eye daily as needed (dry eyes).     . Psyllium (METAMUCIL PO) Take by mouth.    . RESTASIS 0.05 % ophthalmic emulsion Place 1 drop into both eyes 2 (two)  times daily.    . Turmeric 500 MG CAPS Take 1 capsule by mouth daily.      No current facility-administered medications for this encounter.    Allergies  Allergen Reactions  . Sulfa Antibiotics     As a child  . Tape Other (See Comments)  . Glucosamine Forte [Nutritional Supplements] Rash  . Latex Rash      Social History   Social History  . Marital Status: Widowed    Spouse Name: N/A  . Number of Children: N/A  . Years of Education: N/A   Occupational History  . teacher     retired   Social History Main Topics  . Smoking status: Never Smoker   . Smokeless tobacco: Never Used  . Alcohol Use: No  . Drug Use: No  . Sexual Activity: No   Other Topics Concern  . Not on file   Social History Narrative      Family History  Problem Relation Age of Onset  . Lung cancer    . Thrombosis      thromboembolism clotting disorder--? father died of ? clot   . Cancer Mother     mets to bone--? primary  . Coronary artery disease Brother     died of M! @ 31  . Diabetes Brother   . Hyperlipidemia Brother   . Hypertension Brother   . Heart disease Brother     chf  . Heart disease Father 38    MI  . Lung cancer Sister 70    former smoker  . Diabetes Maternal Aunt     Filed Vitals:   08/12/15 0952  BP: 114/68  Pulse: 133  Weight: 159 lb (72.122 kg)  SpO2: 96%    PHYSICAL EXAM: General:  Well appearing. No respiratory difficulty HEENT: normal Neck: supple. no JVD. Carotids 2+ bilat; no bruits. No lymphadenopathy or thryomegaly appreciated. Cor: PMI nondisplaced. Irregular tachy No rubs, gallops or murmurs. Lungs: clear Abdomen: soft, nontender, nondistended. No hepatosplenomegaly. No bruits or masses. Good bowel sounds. Extremities: no cyanosis, clubbing, rash, edema Neuro: alert & oriented x 3, cranial nerves grossly intact. moves all 4 extremities w/o difficulty. Affect pleasant.  ECG: AF with RVR 139 No ST-T wave abnormalities.     ASSESSMENT &  PLAN: 1. New onset AF with RVR     --CHADSVASC 4 (age (4), HTN, female)   She is quite tachycardic. Will start cardizem 180mg  daily today. Continue Eliquis  5 bid. Will get echo today and plan TEE and DC-CV on Friday. I think she is at high-risk for recurrence of AF and will likely start flecainide post DC-CV if echo ok.   Bensimhon, Daniel,MD 10:35 AM

## 2015-08-12 NOTE — Patient Instructions (Signed)
Start Cardizem CD 180 mg daily  Your physician has requested that you have an echocardiogram. Echocardiography is a painless test that uses sound waves to create images of your heart. It provides your doctor with information about the size and shape of your heart and how well your heart's chambers and valves are working. This procedure takes approximately one hour. There are no restrictions for this procedure.  Your physician has requested that you have a TEE/Cardioversion. During a TEE, sound waves are used to create images of your heart. It provides your doctor with information about the size and shape of your heart and how well your heart's chambers and valves are working. In this test, a transducer is attached to the end of a flexible tube that is guided down you throat and into your esophagus (the tube leading from your mouth to your stomach) to get a more detailed image of your heart. Once the TEE has determined that a blood clot is not present, the cardioversion begins. Electrical Cardioversion uses a jolt of electricity to your heart either through paddles or wired patches attached to your chest. This is a controlled, usually prescheduled, procedure. This procedure is done at the hospital and you are not awake during the procedure. You usually go home the day of the procedure. Please see the instruction sheet given to you today for more information.  Your physician recommends that you schedule a follow-up appointment in: 2 weeks

## 2015-08-12 NOTE — Progress Notes (Signed)
Echocardiogram 2D Echocardiogram has been performed.  Jessica Mullins 08/12/2015, 11:44 AM

## 2015-08-13 ENCOUNTER — Telehealth (HOSPITAL_COMMUNITY): Payer: Self-pay | Admitting: *Deleted

## 2015-08-13 NOTE — Telephone Encounter (Signed)
TEE approved through [facility] [id number]

## 2015-08-14 ENCOUNTER — Encounter (HOSPITAL_COMMUNITY): Payer: Self-pay | Admitting: Anesthesiology

## 2015-08-14 ENCOUNTER — Ambulatory Visit (HOSPITAL_COMMUNITY): Payer: Medicare PPO

## 2015-08-14 ENCOUNTER — Ambulatory Visit (HOSPITAL_COMMUNITY)
Admission: RE | Admit: 2015-08-14 | Discharge: 2015-08-14 | Disposition: A | Discharge status: Home / Self Care | Payer: Medicare PPO | Source: Ambulatory Visit | Attending: Internal Medicine | Admitting: Internal Medicine

## 2015-08-14 ENCOUNTER — Encounter (HOSPITAL_COMMUNITY)
Admission: RE | Disposition: A | Discharge status: Home / Self Care | Payer: Self-pay | Source: Ambulatory Visit | Attending: Internal Medicine

## 2015-08-14 ENCOUNTER — Encounter (HOSPITAL_COMMUNITY): Payer: Self-pay | Admitting: *Deleted

## 2015-08-14 DIAGNOSIS — E785 Hyperlipidemia, unspecified: Secondary | ICD-10-CM | POA: Insufficient documentation

## 2015-08-14 DIAGNOSIS — Z7983 Long term (current) use of bisphosphonates: Secondary | ICD-10-CM | POA: Insufficient documentation

## 2015-08-14 DIAGNOSIS — I10 Essential (primary) hypertension: Secondary | ICD-10-CM | POA: Insufficient documentation

## 2015-08-14 DIAGNOSIS — Z79899 Other long term (current) drug therapy: Secondary | ICD-10-CM | POA: Diagnosis not present

## 2015-08-14 DIAGNOSIS — I4891 Unspecified atrial fibrillation: Secondary | ICD-10-CM | POA: Diagnosis not present

## 2015-08-14 DIAGNOSIS — C50912 Malignant neoplasm of unspecified site of left female breast: Secondary | ICD-10-CM | POA: Diagnosis not present

## 2015-08-14 DIAGNOSIS — K219 Gastro-esophageal reflux disease without esophagitis: Secondary | ICD-10-CM | POA: Diagnosis not present

## 2015-08-14 DIAGNOSIS — Z5309 Procedure and treatment not carried out because of other contraindication: Secondary | ICD-10-CM | POA: Diagnosis not present

## 2015-08-14 DIAGNOSIS — Z7901 Long term (current) use of anticoagulants: Secondary | ICD-10-CM | POA: Diagnosis not present

## 2015-08-14 DIAGNOSIS — Z9221 Personal history of antineoplastic chemotherapy: Secondary | ICD-10-CM | POA: Diagnosis not present

## 2015-08-14 DIAGNOSIS — I4819 Other persistent atrial fibrillation: Secondary | ICD-10-CM

## 2015-08-14 SURGERY — CANCELLED PROCEDURE

## 2015-08-14 MED ORDER — SODIUM CHLORIDE 0.9 % IV SOLN: INTRAVENOUS | Status: DC

## 2015-08-14 NOTE — Progress Notes (Signed)
Patient in sinus rhythm, Dr. Haroldine Laws notified, TEE/Cardioversion procedure cancelled per Dr. Haroldine Laws.

## 2015-08-14 NOTE — Anesthesia Preprocedure Evaluation (Deleted)
Anesthesia Evaluation  Patient identified by MRN, date of birth, ID band Patient awake    Reviewed: Allergy & Precautions, NPO status , Patient's Chart, lab work & pertinent test results  Airway Mallampati: II  TM Distance: >3 FB Neck ROM: full    Dental  (+) Teeth Intact, Dental Advidsory Given   Pulmonary neg pulmonary ROS,    breath sounds clear to auscultation       Cardiovascular hypertension, On Medications  Rhythm:regular Rate:Normal     Neuro/Psych  Neuromuscular disease    GI/Hepatic GERD  Medicated and Controlled,  Endo/Other    Renal/GU      Musculoskeletal   Abdominal   Peds  Hematology   Anesthesia Other Findings   Reproductive/Obstetrics                            Anesthesia Physical  Anesthesia Plan  ASA: II  Anesthesia Plan: MAC   Post-op Pain Management:    Induction: Intravenous  Airway Management Planned:   Additional Equipment:   Intra-op Plan:   Post-operative Plan:   Informed Consent: I have reviewed the patients History and Physical, chart, labs and discussed the procedure including the risks, benefits and alternatives for the proposed anesthesia with the patient or authorized representative who has indicated his/her understanding and acceptance.   Dental Advisory Given and Dental advisory given  Plan Discussed with: CRNA  Anesthesia Plan Comments:         Anesthesia Quick Evaluation

## 2015-08-15 ENCOUNTER — Telehealth: Payer: Self-pay | Admitting: Hematology and Oncology

## 2015-08-15 NOTE — Telephone Encounter (Signed)
Spoke with patient to confirm next appointments for 2/20 and 3/6. Per patient she is aware but still not sure why she needs the IVF's 2/20. Per patient she spoke with nurse on Friday and nurse was going to call her back on Monday re IVF's. Left message for desk nurse re checking with patient Monday morning re IVF's.

## 2015-08-17 ENCOUNTER — Telehealth: Payer: Self-pay

## 2015-08-17 ENCOUNTER — Ambulatory Visit (HOSPITAL_BASED_OUTPATIENT_CLINIC_OR_DEPARTMENT_OTHER): Payer: Medicare PPO

## 2015-08-17 ENCOUNTER — Telehealth (HOSPITAL_COMMUNITY): Payer: Self-pay | Admitting: *Deleted

## 2015-08-17 VITALS — BP 137/58 | HR 80 | Temp 97.8°F | Resp 18

## 2015-08-17 DIAGNOSIS — C50112 Malignant neoplasm of central portion of left female breast: Secondary | ICD-10-CM | POA: Diagnosis not present

## 2015-08-17 DIAGNOSIS — I480 Paroxysmal atrial fibrillation: Secondary | ICD-10-CM

## 2015-08-17 DIAGNOSIS — I959 Hypotension, unspecified: Secondary | ICD-10-CM | POA: Diagnosis not present

## 2015-08-17 MED ORDER — SODIUM CHLORIDE 0.9% FLUSH
10.0000 mL | INTRAVENOUS | Status: DC | PRN
  Administered 2015-08-17: 10 mL via INTRAVENOUS
  Filled 2015-08-17: qty 10

## 2015-08-17 MED ORDER — HEPARIN SOD (PORK) LOCK FLUSH 100 UNIT/ML IV SOLN
500.0000 [IU] | Freq: Once | INTRAVENOUS | Status: AC
Start: 2015-08-17 — End: 2015-08-17
  Administered 2015-08-17: 500 [IU] via INTRAVENOUS
  Filled 2015-08-17: qty 5

## 2015-08-17 MED ORDER — FLECAINIDE ACETATE 100 MG PO TABS: 100.0000 mg | ORAL_TABLET | Freq: Two times a day (BID) | ORAL | Status: DC

## 2015-08-17 MED ORDER — SODIUM CHLORIDE 0.9 % IV SOLN
Freq: Once | INTRAVENOUS | Status: AC
  Administered 2015-08-17: 15:00:00 via INTRAVENOUS

## 2015-08-17 NOTE — Telephone Encounter (Signed)
Pt aware and agreeable, rx sent in, order placed for gxt

## 2015-08-17 NOTE — Telephone Encounter (Signed)
-----   Message from Jolaine Artist, MD sent at 08/14/2015 10:22 AM EST ----- She was in NSR on presenting for DCCV today. Can you start flecainide 100 bid and schedule low level treadmill test to exclude proarrhythmia

## 2015-08-17 NOTE — Patient Instructions (Signed)

## 2015-08-17 NOTE — Telephone Encounter (Signed)
Per msg from scheduling, contacted pt re: today's appt for IVF.  Pt reports an episode of extreme dizziness early this am, concerned she was going to pass out.  Pt reports diarrhea yesterday but not bad, is taking in over 64 oz fluid daily, BP today was 104/57, HR 83.  Writer recommended pt come in for her fluids today as her BP is still low relative to her baseline.  Pt voiced understanding.

## 2015-08-18 ENCOUNTER — Telehealth (HOSPITAL_COMMUNITY): Payer: Self-pay | Admitting: *Deleted

## 2015-08-18 NOTE — Telephone Encounter (Signed)
Patient left a voice message requesting a call from the pharmacist about "scary" side effects of new medication prescribed. Please contact. Thank you.

## 2015-08-19 ENCOUNTER — Telehealth (HOSPITAL_COMMUNITY): Payer: Self-pay | Admitting: Pharmacist

## 2015-08-19 NOTE — Telephone Encounter (Signed)
Mrs. Leezer called inquiring about the side effects of new flecainide which she has not started taking. We reviewed the common side effects including dizziness, visual disturbances (i.e. blurry vision), headache and nausea. She was also concerned about constipation since her chemo also causes constipation. This is listed as a side effect that occurs in 1% of patients and, in fact, diarrhea is listed to occur in up to 3% of patients. Most of these side effects are dose-related so I suggested that she could start out with 50 mg BID until her appointment early next week at which point we would obtain an EKG and assess whether or not to increase the dose at that point. She verbalized understanding of the information we discussed and is much more comfortable starting the medication.   Ruta Hinds. Velva Harman, PharmD, BCPS, CPP Clinical Pharmacist Pager: [phone] Phone: [phone] 08/19/2015 9:53 AM

## 2015-08-25 ENCOUNTER — Ambulatory Visit (HOSPITAL_BASED_OUTPATIENT_CLINIC_OR_DEPARTMENT_OTHER)
Admission: RE | Admit: 2015-08-25 | Discharge: 2015-08-25 | Disposition: A | Discharge status: Home / Self Care | Payer: Medicare PPO | Source: Ambulatory Visit | Attending: Internal Medicine | Admitting: Internal Medicine

## 2015-08-25 ENCOUNTER — Encounter (HOSPITAL_COMMUNITY): Payer: Self-pay | Admitting: Internal Medicine

## 2015-08-25 ENCOUNTER — Ambulatory Visit (HOSPITAL_COMMUNITY)
Admission: RE | Admit: 2015-08-25 | Discharge: 2015-08-25 | Disposition: A | Discharge status: Home / Self Care | Payer: Medicare PPO | Source: Ambulatory Visit | Attending: Internal Medicine | Admitting: Internal Medicine

## 2015-08-25 VITALS — BP 136/82 | HR 75 | Wt 152.5 lb

## 2015-08-25 DIAGNOSIS — C50112 Malignant neoplasm of central portion of left female breast: Secondary | ICD-10-CM

## 2015-08-25 DIAGNOSIS — Z833 Family history of diabetes mellitus: Secondary | ICD-10-CM | POA: Insufficient documentation

## 2015-08-25 DIAGNOSIS — K219 Gastro-esophageal reflux disease without esophagitis: Secondary | ICD-10-CM | POA: Insufficient documentation

## 2015-08-25 DIAGNOSIS — Z8249 Family history of ischemic heart disease and other diseases of the circulatory system: Secondary | ICD-10-CM | POA: Diagnosis not present

## 2015-08-25 DIAGNOSIS — E785 Hyperlipidemia, unspecified: Secondary | ICD-10-CM | POA: Diagnosis not present

## 2015-08-25 DIAGNOSIS — Z882 Allergy status to sulfonamides status: Secondary | ICD-10-CM | POA: Diagnosis not present

## 2015-08-25 DIAGNOSIS — Z91040 Latex allergy status: Secondary | ICD-10-CM | POA: Diagnosis not present

## 2015-08-25 DIAGNOSIS — I480 Paroxysmal atrial fibrillation: Secondary | ICD-10-CM

## 2015-08-25 DIAGNOSIS — Z7902 Long term (current) use of antithrombotics/antiplatelets: Secondary | ICD-10-CM | POA: Insufficient documentation

## 2015-08-25 DIAGNOSIS — Z85820 Personal history of malignant melanoma of skin: Secondary | ICD-10-CM | POA: Diagnosis not present

## 2015-08-25 DIAGNOSIS — Z85828 Personal history of other malignant neoplasm of skin: Secondary | ICD-10-CM | POA: Diagnosis not present

## 2015-08-25 DIAGNOSIS — Z79899 Other long term (current) drug therapy: Secondary | ICD-10-CM | POA: Insufficient documentation

## 2015-08-25 DIAGNOSIS — C50912 Malignant neoplasm of unspecified site of left female breast: Secondary | ICD-10-CM | POA: Insufficient documentation

## 2015-08-25 DIAGNOSIS — I10 Essential (primary) hypertension: Secondary | ICD-10-CM | POA: Insufficient documentation

## 2015-08-25 NOTE — Progress Notes (Signed)
Patient ID: Jessica Mullins, female   DOB: Apr 19, 1931, 82 y.o.   MRN: [id number]  CARDIOLOGY CLINIC  NOTE  Referring Physician: Lindi Mullins Primary Care: Jessica Mullins Primary Cardiologist: None  HPI:  Jessica Mullins is a an 82 y.o woman with h/o HTN, HL, PAF and left breast CA.   Diagnosed with breast CA in 11/17. Underwent lumpectomy in 12/16. Had 2/4 rounds of chemo so far with taxotere and Cytoxan last dose Monday.  Last month found to have AF with RVR. Started on Eliquis. Echo normal.   We arranged for TEE/DC-CV on 08/14/15 but when she arrived for procedure she was in NSR. Given high risk of recurrence we started flecainide 50 bid. She had treadmill test today to exclude proarrhythmia. Baseline ECG with NSR 73. QRS and PR intervlas normal. No arrhythmias. Nonspecific ST changes with exercise. No CP. Getting chemo. Continues on Eliquis. Has had mild epistaxis but not severe. + chronic cough.      Past Medical History  Diagnosis Date  . Hypertension   . Hyperlipidemia   . Bursitis of right shoulder   . Cataract   . Skin cancer 2000    melanoma and basal cell  . Breast cancer ([place]) 1115/16    left   . Allergy   . Vaso vagal episode     during preparation for colonoscopy  . GERD (gastroesophageal reflux disease)   . Fracture of right wrist   . Ankle fracture, left   . Family history of cancer     Current Outpatient Prescriptions  Medication Sig Dispense Refill  . alendronate (FOSAMAX) 70 MG tablet TAKE 1 TABLET (70 MG TOTAL) BY MOUTH EVERY SEVEN DAYS. TAKE WITH A FULL GLASS OF WATER AT NOON TIME ON EMPTY STOMACH. 4 tablet 11  . apixaban (ELIQUIS) 5 MG TABS tablet Take 1 tablet (5 mg total) by mouth 2 (two) times daily. 60 tablet 3  . Biotin (BIOTIN 5000) 5 MG CAPS Take 5 mg by mouth daily.     . Calcium Carbonate-Vitamin D (CALTRATE 600+D) 600-400 MG-UNIT per tablet Take 1 tablet by mouth daily.     . Cholecalciferol (VITAMIN D3) 1000 UNITS CAPS Take 1 capsule by mouth daily.      [name] diltiazem  (CARDIZEM CD) 180 MG 24 hr capsule Take 1 capsule (180 mg total) by mouth daily. 30 capsule 3  . flecainide (TAMBOCOR) 50 MG tablet Take 50 mg by mouth 2 (two) times daily.    . Multiple Vitamins-Minerals (MULTIVITAMIN ADULT PO) Take 1 tablet by mouth 2 (two) times daily.     . Omega-3 Fatty Acids (FISH OIL PO) Take 1 tablet by mouth daily.     [name] omeprazole (PRILOSEC) 20 MG capsule TAKE 1 CAPSULE (20 MG TOTAL) BY MOUTH DAILY. 30 capsule 11  . pravastatin (PRAVACHOL) 40 MG tablet TAKE 1 TABLET (40 MG TOTAL) BY MOUTH AT BEDTIME. 90 tablet 0  . Propylene Glycol 0.6 % SOLN Apply 1 drop to eye daily as needed (dry eyes).     . Psyllium (METAMUCIL PO) Take 15 mLs by mouth daily.     . RESTASIS 0.05 % ophthalmic emulsion Place 1 drop into both eyes 2 (two) times daily.    . Turmeric 500 MG CAPS Take 1 capsule by mouth daily.     [name] lidocaine-prilocaine (EMLA) cream Apply to affected area once (Patient not taking: Reported on 08/25/2015) 30 g 3  . ondansetron (ZOFRAN) 8 MG tablet Take 8 mg by mouth every 8 (eight) hours as  needed for nausea. Reported on 08/25/2015     No current facility-administered medications for this encounter.    Allergies  Allergen Reactions  . Sulfa Antibiotics Other (See Comments)    As a child, rigid as a stick and not responsive  . Tape Other (See Comments)    Blisters, Please use "paper" tape only for short periods of time  . Glucosamine Forte [Nutritional Supplements] Rash  . Latex Rash      Social History   Social History  . Marital Status: Widowed    Spouse Name: N/A  . Number of Children: N/A  . Years of Education: N/A   Occupational History  . teacher     retired   Social History Main Topics  . Smoking status: Never Smoker   . Smokeless tobacco: Never Used  . Alcohol Use: No  . Drug Use: No  . Sexual Activity: No   Other Topics Concern  . Not on file   Social History Narrative      Family History  Problem Relation Age of Onset  . Lung  cancer    . Thrombosis      thromboembolism clotting disorder--? father died of ? clot   . Cancer Mother     mets to bone--? primary  . Coronary artery disease Brother     died of M! @ 34  . Diabetes Brother   . Hyperlipidemia Brother   . Hypertension Brother   . Heart disease Brother     chf  . Heart disease Father 57    MI  . Lung cancer Sister 74    former smoker  . Diabetes Maternal Aunt     Filed Vitals:   08/25/15 1423  BP: 136/82  Pulse: 75  Weight: 152 lb 8 oz (69.174 kg)  SpO2: 99%    PHYSICAL EXAM: General:  Elderly. Alopecic. NAD HEENT: normal Neck: supple. no JVD. Carotids 2+ bilat; no bruits. No lymphadenopathy or thryomegaly appreciated. Cor: PMI nondisplaced. Irregular tachy No rubs, gallops or murmurs. Lungs: clear Abdomen: soft, nontender, nondistended. No hepatosplenomegaly. No bruits or masses. Good bowel sounds. Extremities: no cyanosis, clubbing, rash, edema Neuro: alert & oriented x 3, cranial nerves grossly intact. moves all 4 extremities w/o difficulty. Affect pleasant   ASSESSMENT & PLAN: 1. PAF with RVR     --CHADSVASC 4 (age (4), HTN, female)      --echo normal     --maintaining NSR on flecainide. Intervals normal. No arrhythmia on stress test. Continue flecainide, diltiazem and Eliquis  Jessica Mullins, Daniel,MD 2:36 PM

## 2015-08-25 NOTE — Patient Instructions (Signed)
Your physician recommends that you schedule a follow-up appointment in: 3-4 weeks  

## 2015-08-27 LAB — EXERCISE TOLERANCE TEST
Estimated workload: 4.6 METS
Exercise duration (min): 2 min
Exercise duration (sec): 21 s
MPHR: 138 {beats}/min
Peak HR: 133 {beats}/min
Percent HR: 96 %
RPE: 13
Rest HR: 86 {beats}/min

## 2015-08-30 NOTE — Assessment & Plan Note (Signed)
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  Treatment Plan: 1. Adjuvant chemotherapy with Taxotere and Cytoxan 4 cycles 2. Followed by adjuvant radiation therapy ----------------------------------------------------------------------------------------------------------------------- Current Treatment: Cycle 3 day 1 Taxotere Cytoxan Chemo Toxicities: 1. Neulasta related bone pain 2. Leukocytosis related to Neulasta: I will discontinue Neulasta from cycle 2. 3. Constipation improved with stool softeners and laxatives 4. Taste changes 5. Dizziness  6. Alopecia 7. Severe hypotension with dizziness and near-syncope: Required IV fluids 8. New onset atrial fibrillation 08/10/2015: heart rate was 86.   RTC in 1 week for fluids if needed

## 2015-08-31 ENCOUNTER — Ambulatory Visit (HOSPITAL_BASED_OUTPATIENT_CLINIC_OR_DEPARTMENT_OTHER): Payer: Medicare PPO

## 2015-08-31 ENCOUNTER — Encounter: Payer: Self-pay | Admitting: *Deleted

## 2015-08-31 ENCOUNTER — Other Ambulatory Visit (HOSPITAL_BASED_OUTPATIENT_CLINIC_OR_DEPARTMENT_OTHER): Payer: Medicare PPO

## 2015-08-31 ENCOUNTER — Ambulatory Visit (HOSPITAL_BASED_OUTPATIENT_CLINIC_OR_DEPARTMENT_OTHER): Payer: Medicare PPO | Admitting: Hematology and Oncology

## 2015-08-31 ENCOUNTER — Encounter: Payer: Self-pay | Admitting: Hematology and Oncology

## 2015-08-31 VITALS — BP 137/63 | HR 74 | Temp 98.2°F | Resp 18 | Ht 60.5 in | Wt 152.9 lb

## 2015-08-31 DIAGNOSIS — Z171 Estrogen receptor negative status [ER-]: Secondary | ICD-10-CM | POA: Diagnosis not present

## 2015-08-31 DIAGNOSIS — I4891 Unspecified atrial fibrillation: Secondary | ICD-10-CM | POA: Diagnosis not present

## 2015-08-31 DIAGNOSIS — C50112 Malignant neoplasm of central portion of left female breast: Secondary | ICD-10-CM

## 2015-08-31 DIAGNOSIS — C773 Secondary and unspecified malignant neoplasm of axilla and upper limb lymph nodes: Secondary | ICD-10-CM

## 2015-08-31 DIAGNOSIS — D72829 Elevated white blood cell count, unspecified: Secondary | ICD-10-CM

## 2015-08-31 DIAGNOSIS — Z5111 Encounter for antineoplastic chemotherapy: Secondary | ICD-10-CM | POA: Diagnosis not present

## 2015-08-31 LAB — CBC WITH DIFFERENTIAL/PLATELET
BASO%: 1.2 % (ref 0.0–2.0)
Basophils Absolute: 0.1 10*3/uL (ref 0.0–0.1)
EOS%: 0.3 % (ref 0.0–7.0)
Eosinophils Absolute: 0 10*3/uL (ref 0.0–0.5)
HCT: 39.2 % (ref 34.8–46.6)
HGB: 13 g/dL (ref 11.6–15.9)
LYMPH%: 12.8 % — ABNORMAL LOW (ref 14.0–49.7)
MCH: 29.7 pg (ref 25.1–34.0)
MCHC: 33 g/dL (ref 31.5–36.0)
MCV: 89.9 fL (ref 79.5–101.0)
MONO#: 0.9 10*3/uL (ref 0.1–0.9)
MONO%: 7.9 % (ref 0.0–14.0)
NEUT#: 8.8 10*3/uL — ABNORMAL HIGH (ref 1.5–6.5)
NEUT%: 77.8 % — ABNORMAL HIGH (ref 38.4–76.8)
Platelets: 241 10*3/uL (ref 145–400)
RBC: 4.36 10*6/uL (ref 3.70–5.45)
RDW: 14.9 % — ABNORMAL HIGH (ref 11.2–14.5)
WBC: 11.3 10*3/uL — ABNORMAL HIGH (ref 3.9–10.3)
lymph#: 1.4 10*3/uL (ref 0.9–3.3)

## 2015-08-31 LAB — COMPREHENSIVE METABOLIC PANEL
ALT: 15 U/L (ref 0–55)
AST: 18 U/L (ref 5–34)
Albumin: 3.3 g/dL — ABNORMAL LOW (ref 3.5–5.0)
Alkaline Phosphatase: 89 U/L (ref 40–150)
Anion Gap: 8 mEq/L (ref 3–11)
BUN: 14.4 mg/dL (ref 7.0–26.0)
CO2: 27 mEq/L (ref 22–29)
Calcium: 9.4 mg/dL (ref 8.4–10.4)
Chloride: 102 mEq/L (ref 98–109)
Creatinine: 0.8 mg/dL (ref 0.6–1.1)
EGFR: 69 mL/min/{1.73_m2} — ABNORMAL LOW (ref >90)
Glucose: 97 mg/dl (ref 70–140)
Potassium: 3.9 mEq/L (ref 3.5–5.1)
Sodium: 138 mEq/L (ref 136–145)
Total Bilirubin: 0.38 mg/dL (ref 0.20–1.20)
Total Protein: 6.5 g/dL (ref 6.4–8.3)

## 2015-08-31 MED ORDER — PALONOSETRON HCL INJECTION 0.25 MG/5ML
0.2500 mg | Freq: Once | INTRAVENOUS | Status: AC
  Administered 2015-08-31: 0.25 mg via INTRAVENOUS

## 2015-08-31 MED ORDER — DOCETAXEL CHEMO INJECTION 160 MG/16ML
55.0000 mg/m2 | Freq: Once | INTRAVENOUS | Status: AC
  Administered 2015-08-31: 100 mg via INTRAVENOUS
  Filled 2015-08-31: qty 10

## 2015-08-31 MED ORDER — HEPARIN SOD (PORK) LOCK FLUSH 100 UNIT/ML IV SOLN
500.0000 [IU] | Freq: Once | INTRAVENOUS | Status: AC | PRN
  Administered 2015-08-31: 500 [IU]
  Filled 2015-08-31: qty 5

## 2015-08-31 MED ORDER — PALONOSETRON HCL INJECTION 0.25 MG/5ML
INTRAVENOUS | Status: AC
  Filled 2015-08-31: qty 5

## 2015-08-31 MED ORDER — SODIUM CHLORIDE 0.9 % IV SOLN
Freq: Once | INTRAVENOUS | Status: AC
  Administered 2015-08-31: 14:00:00 via INTRAVENOUS

## 2015-08-31 MED ORDER — SODIUM CHLORIDE 0.9 % IV SOLN
500.0000 mg/m2 | Freq: Once | INTRAVENOUS | Status: AC
  Administered 2015-08-31: 880 mg via INTRAVENOUS
  Filled 2015-08-31: qty 44

## 2015-08-31 MED ORDER — DEXAMETHASONE SODIUM PHOSPHATE 100 MG/10ML IJ SOLN
Freq: Once | INTRAMUSCULAR | Status: AC
  Administered 2015-08-31: 15:00:00 via INTRAVENOUS
  Filled 2015-08-31: qty 1

## 2015-08-31 MED ORDER — SODIUM CHLORIDE 0.9 % IJ SOLN
10.0000 mL | INTRAMUSCULAR | Status: DC | PRN
  Administered 2015-08-31: 10 mL
  Filled 2015-08-31: qty 10

## 2015-08-31 NOTE — Progress Notes (Signed)
Patient Care Team: Rosalita Chessman, DO as PCP - General Nicholas Lose, MD as Consulting Physician (Hematology and Oncology) Erroll Luna, MD as Consulting Physician (General Surgery) SUMMARY OF ONCOLOGIC HISTORY:   Cancer of central portion of left female breast ([place])   05/04/2015 Mammogram  left breast distortion , breast density category A , 7 mm by ultrasound at 3:00 position middle depth   05/12/2015 Initial Diagnosis  left breast biopsy: invasive ductal cancer with DCIS , ER 0%, PR 0%, Ki-67 5%, HER-2 negative ratio 0.83   06/11/2015 Surgery Left lumpectomy: DCIS, left additional margin: IDC 1.3 cm + DCIS 1/4 LN positive, grade 2, margins negative, LVI present, ER 0%, PR 0%, HER-2 negative ratio 0.60, Ki-67 5%, T1cN1a stage II a, Mammaprint high risk luminal B   07/20/2015 -  Chemotherapy Taxotere and Cytoxan adjuvant chemotherapy  4 cycles    CHIEF COMPLIANT: Cycle 3 Taxotere and Cytoxan  INTERVAL HISTORY: Jessica Mullins is a 82 year old with above-mentioned history of left breast cancer treated with lumpectomy and have 1 out of 4 lymph nodes positive and was found to be high risk Mammaprint. Today is her cycle 3 of adjuvant chemotherapy with Taxotere and Cytoxan. She had dizziness after cycle 1 and we diagnosed her with atrial fibrillation. She cardioverted spontaneously and was started on flecainide. The dizziness went away. She appears to be tolerating the chemotherapy fairly well. Does not have any nausea vomiting issues. Denies any neuropathy. She did have alopecia.  REVIEW OF SYSTEMS:   Constitutional: Denies fevers, chills or abnormal weight loss, alopecia Eyes: Denies blurriness of vision Ears, nose, mouth, throat, and face: Denies mucositis or sore throat Respiratory: Complains of chronic cough Cardiovascular: Denies palpitation, chest discomfort Gastrointestinal:  Denies nausea, heartburn or change in bowel habits Skin: Denies abnormal skin rashes Lymphatics: Denies new  lymphadenopathy or easy bruising Neurological:Denies numbness, tingling or new weaknesses Behavioral/Psych: Mood is stable, no new changes  Extremities: No lower extremity edema  All other systems were reviewed with the patient and are negative.  I have reviewed the past medical history, past surgical history, social history and family history with the patient and they are unchanged from previous note.  ALLERGIES:  is allergic to sulfa antibiotics; tape; glucosamine forte; and latex.  MEDICATIONS:  Current Outpatient Prescriptions  Medication Sig Dispense Refill  . alendronate (FOSAMAX) 70 MG tablet TAKE 1 TABLET (70 MG TOTAL) BY MOUTH EVERY SEVEN DAYS. TAKE WITH A FULL GLASS OF WATER AT NOON TIME ON EMPTY STOMACH. 4 tablet 11  . apixaban (ELIQUIS) 5 MG TABS tablet Take 1 tablet (5 mg total) by mouth 2 (two) times daily. 60 tablet 3  . Biotin (BIOTIN 5000) 5 MG CAPS Take 5 mg by mouth daily.     . Calcium Carbonate-Vitamin D (CALTRATE 600+D) 600-400 MG-UNIT per tablet Take 1 tablet by mouth daily.     . Cholecalciferol (VITAMIN D3) 1000 UNITS CAPS Take 1 capsule by mouth daily.      [name] diltiazem (CARDIZEM CD) 180 MG 24 hr capsule Take 1 capsule (180 mg total) by mouth daily. 30 capsule 3  . flecainide (TAMBOCOR) 50 MG tablet Take 50 mg by mouth 2 (two) times daily.    [name] lidocaine-prilocaine (EMLA) cream Apply to affected area once (Patient not taking: Reported on 08/25/2015) 30 g 3  . Multiple Vitamins-Minerals (MULTIVITAMIN ADULT PO) Take 1 tablet by mouth 2 (two) times daily.     . Omega-3 Fatty Acids (FISH OIL PO) Take 1  tablet by mouth daily.     [name] omeprazole (PRILOSEC) 20 MG capsule TAKE 1 CAPSULE (20 MG TOTAL) BY MOUTH DAILY. 30 capsule 11  . ondansetron (ZOFRAN) 8 MG tablet Take 8 mg by mouth every 8 (eight) hours as needed for nausea. Reported on 08/25/2015    . pravastatin (PRAVACHOL) 40 MG tablet TAKE 1 TABLET (40 MG TOTAL) BY MOUTH AT BEDTIME. 90 tablet 0  . Propylene Glycol 0.6 %  SOLN Apply 1 drop to eye daily as needed (dry eyes).     . Psyllium (METAMUCIL PO) Take 15 mLs by mouth daily.     . RESTASIS 0.05 % ophthalmic emulsion Place 1 drop into both eyes 2 (two) times daily.    . Turmeric 500 MG CAPS Take 1 capsule by mouth daily.      No current facility-administered medications for this visit.    PHYSICAL EXAMINATION: ECOG PERFORMANCE STATUS: 1 - Symptomatic but completely ambulatory  There were no vitals filed for this visit. There were no vitals filed for this visit.  GENERAL:alert, no distress and comfortable SKIN: skin color, texture, turgor are normal, no rashes or significant lesions EYES: normal, Conjunctiva are pink and non-injected, sclera clear OROPHARYNX:no exudate, no erythema and lips, buccal mucosa, and tongue normal  NECK: supple, thyroid normal size, non-tender, without nodularity LYMPH:  no palpable lymphadenopathy in the cervical, axillary or inguinal LUNGS: clear to auscultation and percussion with normal breathing effort HEART: regular rate & rhythm and no murmurs and no lower extremity edema ABDOMEN:abdomen soft, non-tender and normal bowel sounds MUSCULOSKELETAL:no cyanosis of digits and no clubbing  NEURO: alert & oriented x 3 with fluent speech, no focal motor/sensory deficits EXTREMITIES: No lower extremity edema  LABORATORY DATA:  I have reviewed the data as listed   Chemistry      Component Value Date/Time   NA 141 08/10/2015 0809   NA 143 07/15/2015 1220   K 4.5 08/10/2015 0809   K 4.4 07/15/2015 1220   CL 107 07/15/2015 1220   CO2 28 08/10/2015 0809   CO2 26 07/15/2015 1220   BUN 16.1 08/10/2015 0809   BUN 15 07/15/2015 1220   CREATININE 0.8 08/10/2015 0809   CREATININE 0.67 07/15/2015 1220      Component Value Date/Time   CALCIUM 10.6* 08/10/2015 0809   CALCIUM 9.7 07/15/2015 1220   ALKPHOS 240* 08/10/2015 0809   ALKPHOS 57 06/10/2015 0944   AST 45* 08/10/2015 0809   AST 23 06/10/2015 0944   ALT 144*  08/10/2015 0809   ALT 23 06/10/2015 0944   BILITOT 0.68 08/10/2015 0809   BILITOT 0.7 06/10/2015 0944      Lab Results  Component Value Date   WBC 11.3* 08/31/2015   HGB 13.0 08/31/2015   HCT 39.2 08/31/2015   MCV 89.9 08/31/2015   PLT 241 08/31/2015   NEUTROABS 8.8* 08/31/2015   ASSESSMENT & PLAN:  Cancer of central portion of left female breast ([place]) Left lumpectomy 06/11/2015: DCIS, left additional margin: IDC 1.3 cm + DCIS 1/4 LN positive, grade 2, margins negative, LVI present, ER 0%, PR 0%, HER-2 negative ratio 0.60, Ki-67 5%, T1cN1a stage II a , Mammaprint high risk ( 5 year risk 22%,10 year risk 29%) luminal type B, Distant metastasis free survival at 5 years 88% with chemotherapy.  Treatment Plan: 1. Adjuvant chemotherapy with Taxotere and Cytoxan 4 cycles 2. Followed by adjuvant radiation therapy ----------------------------------------------------------------------------------------------------------------------- Current Treatment: Cycle 3 day 1 Taxotere Cytoxan Chemo Toxicities: 1. Neulasta related bone  pain: Discontinue Neulasta 2. Leukocytosis related to Neulasta: I will discontinue Neulasta from cycle 2. 3. Constipation improved with stool softeners and laxatives 4. Taste changes: Cannot tolerate sweet stuff 5. Dizziness: Related to atrial fibrillation  6. Alopecia 7.Atrial fibrillation 08/10/2015:We did cause the patient dizziness and lightheadedness. Spontaneously cardioverted. Currently on flecainide and Eliquis.  Return to clinic in 3 weeks for cycle 4 of treatment.  No orders of the defined types were placed in this encounter.   The patient has a good understanding of the overall plan. she agrees with it. she will call with any problems that may develop before the next visit here.   Rulon Eisenmenger, MD 08/31/2015

## 2015-08-31 NOTE — Patient Instructions (Signed)
[facility] Discharge Instructions for Patients Receiving Chemotherapy  Today you received the following chemotherapy agents :  Taxotere/Cytoxan  To help prevent nausea and vomiting after your treatment, we encourage you to take your nausea medication HOME. If you develop nausea and vomiting that is not controlled by your nausea medication, call the clinic.   BELOW ARE SYMPTOMS THAT SHOULD BE REPORTED IMMEDIATELY:  *FEVER GREATER THAN 100.5 F  *CHILLS WITH OR WITHOUT FEVER  NAUSEA AND VOMITING THAT IS NOT CONTROLLED WITH YOUR NAUSEA MEDICATION  *UNUSUAL SHORTNESS OF BREATH  *UNUSUAL BRUISING OR BLEEDING  TENDERNESS IN MOUTH AND THROAT WITH OR WITHOUT PRESENCE OF ULCERS  *URINARY PROBLEMS  *BOWEL PROBLEMS  UNUSUAL RASH Items with * indicate a potential emergency and should be followed up as soon as possible.  Feel free to call the clinic you have any questions or concerns. The clinic phone number is [phone].  Please show the [place] at check-in to the Emergency Department and triage nurse.

## 2015-08-31 NOTE — Progress Notes (Signed)
Unable to get in to exam room prior to MD.  No assessment performed.  

## 2015-09-09 ENCOUNTER — Encounter: Payer: Self-pay | Admitting: Hematology and Oncology

## 2015-09-09 NOTE — Progress Notes (Signed)
Sent to medical records fmla forms that were faxed 07/23/15

## 2015-09-21 ENCOUNTER — Ambulatory Visit (HOSPITAL_BASED_OUTPATIENT_CLINIC_OR_DEPARTMENT_OTHER): Payer: Medicare PPO | Admitting: Hematology and Oncology

## 2015-09-21 ENCOUNTER — Encounter: Payer: Self-pay | Admitting: Hematology and Oncology

## 2015-09-21 ENCOUNTER — Telehealth: Payer: Self-pay | Admitting: Hematology and Oncology

## 2015-09-21 ENCOUNTER — Ambulatory Visit (HOSPITAL_BASED_OUTPATIENT_CLINIC_OR_DEPARTMENT_OTHER): Payer: Medicare PPO

## 2015-09-21 ENCOUNTER — Other Ambulatory Visit (HOSPITAL_BASED_OUTPATIENT_CLINIC_OR_DEPARTMENT_OTHER): Payer: Medicare PPO

## 2015-09-21 ENCOUNTER — Encounter: Payer: Self-pay | Admitting: *Deleted

## 2015-09-21 VITALS — BP 150/73 | HR 77 | Temp 98.2°F | Resp 18 | Wt 156.9 lb

## 2015-09-21 DIAGNOSIS — C50112 Malignant neoplasm of central portion of left female breast: Secondary | ICD-10-CM

## 2015-09-21 DIAGNOSIS — Z171 Estrogen receptor negative status [ER-]: Secondary | ICD-10-CM

## 2015-09-21 DIAGNOSIS — Z5111 Encounter for antineoplastic chemotherapy: Secondary | ICD-10-CM | POA: Diagnosis not present

## 2015-09-21 DIAGNOSIS — I4891 Unspecified atrial fibrillation: Secondary | ICD-10-CM | POA: Diagnosis not present

## 2015-09-21 DIAGNOSIS — C773 Secondary and unspecified malignant neoplasm of axilla and upper limb lymph nodes: Secondary | ICD-10-CM

## 2015-09-21 DIAGNOSIS — D72828 Other elevated white blood cell count: Secondary | ICD-10-CM

## 2015-09-21 LAB — COMPREHENSIVE METABOLIC PANEL
ALT: 17 U/L (ref 0–55)
AST: 19 U/L (ref 5–34)
Albumin: 3.3 g/dL — ABNORMAL LOW (ref 3.5–5.0)
Alkaline Phosphatase: 76 U/L (ref 40–150)
Anion Gap: 7 mEq/L (ref 3–11)
BUN: 9.5 mg/dL (ref 7.0–26.0)
CO2: 30 mEq/L — ABNORMAL HIGH (ref 22–29)
Calcium: 10.3 mg/dL (ref 8.4–10.4)
Chloride: 104 mEq/L (ref 98–109)
Creatinine: 0.8 mg/dL (ref 0.6–1.1)
EGFR: 67 mL/min/{1.73_m2} — ABNORMAL LOW (ref >90)
Glucose: 101 mg/dl (ref 70–140)
Potassium: 4.8 mEq/L (ref 3.5–5.1)
Sodium: 140 mEq/L (ref 136–145)
Total Bilirubin: 0.31 mg/dL (ref 0.20–1.20)
Total Protein: 6.7 g/dL (ref 6.4–8.3)

## 2015-09-21 LAB — CBC WITH DIFFERENTIAL/PLATELET
BASO%: 0.8 % (ref 0.0–2.0)
Basophils Absolute: 0.1 10*3/uL (ref 0.0–0.1)
EOS%: 0.3 % (ref 0.0–7.0)
Eosinophils Absolute: 0 10*3/uL (ref 0.0–0.5)
HCT: 40.3 % (ref 34.8–46.6)
HGB: 13.2 g/dL (ref 11.6–15.9)
LYMPH%: 11.9 % — ABNORMAL LOW (ref 14.0–49.7)
MCH: 28.8 pg (ref 25.1–34.0)
MCHC: 32.8 g/dL (ref 31.5–36.0)
MCV: 88 fL (ref 79.5–101.0)
MONO#: 1 10*3/uL — ABNORMAL HIGH (ref 0.1–0.9)
MONO%: 7.7 % (ref 0.0–14.0)
NEUT#: 10 10*3/uL — ABNORMAL HIGH (ref 1.5–6.5)
NEUT%: 79.3 % — ABNORMAL HIGH (ref 38.4–76.8)
Platelets: 282 10*3/uL (ref 145–400)
RBC: 4.58 10*6/uL (ref 3.70–5.45)
RDW: 15 % — ABNORMAL HIGH (ref 11.2–14.5)
WBC: 12.6 10*3/uL — ABNORMAL HIGH (ref 3.9–10.3)
lymph#: 1.5 10*3/uL (ref 0.9–3.3)

## 2015-09-21 MED ORDER — PALONOSETRON HCL INJECTION 0.25 MG/5ML
0.2500 mg | Freq: Once | INTRAVENOUS | Status: AC
  Administered 2015-09-21: 0.25 mg via INTRAVENOUS

## 2015-09-21 MED ORDER — SODIUM CHLORIDE 0.9 % IV SOLN
Freq: Once | INTRAVENOUS | Status: AC
  Administered 2015-09-21: 14:00:00 via INTRAVENOUS

## 2015-09-21 MED ORDER — SODIUM CHLORIDE 0.9 % IV SOLN
Freq: Once | INTRAVENOUS | Status: AC
  Administered 2015-09-21: 15:00:00 via INTRAVENOUS
  Filled 2015-09-21: qty 1

## 2015-09-21 MED ORDER — SODIUM CHLORIDE 0.9 % IV SOLN
55.0000 mg/m2 | Freq: Once | INTRAVENOUS | Status: AC
  Administered 2015-09-21: 100 mg via INTRAVENOUS
  Filled 2015-09-21: qty 10

## 2015-09-21 MED ORDER — PALONOSETRON HCL INJECTION 0.25 MG/5ML
INTRAVENOUS | Status: AC
  Filled 2015-09-21: qty 5

## 2015-09-21 MED ORDER — SODIUM CHLORIDE 0.9 % IJ SOLN
10.0000 mL | INTRAMUSCULAR | Status: DC | PRN
  Administered 2015-09-21: 10 mL
  Filled 2015-09-21: qty 10

## 2015-09-21 MED ORDER — SODIUM CHLORIDE 0.9 % IV SOLN
500.0000 mg/m2 | Freq: Once | INTRAVENOUS | Status: AC
  Administered 2015-09-21: 880 mg via INTRAVENOUS
  Filled 2015-09-21: qty 44

## 2015-09-21 MED ORDER — HEPARIN SOD (PORK) LOCK FLUSH 100 UNIT/ML IV SOLN
500.0000 [IU] | Freq: Once | INTRAVENOUS | Status: AC | PRN
  Administered 2015-09-21: 500 [IU]
  Filled 2015-09-21: qty 5

## 2015-09-21 NOTE — Telephone Encounter (Signed)
Gave patient avs report and appointments for July. Spoke with Suanne Marker in [place] re f/u with Dr. Pablo Ledger to start xrt in 2 weeks. Suanne Marker will contact patient re appointment - patient aware.

## 2015-09-21 NOTE — Progress Notes (Signed)
Unable to get in to exam room prior to MD.  No assessment performed.  

## 2015-09-21 NOTE — Progress Notes (Signed)
Patient Care Team: Rosalita Chessman, DO as PCP - General Nicholas Lose, MD as Consulting Physician (Hematology and Oncology) Erroll Luna, MD as Consulting Physician (General Surgery)  SUMMARY OF ONCOLOGIC HISTORY:   Cancer of central portion of left female breast ([place])   05/04/2015 Mammogram  left breast distortion , breast density category A , 7 mm by ultrasound at 3:00 position middle depth   05/12/2015 Initial Diagnosis  left breast biopsy: invasive ductal cancer with DCIS , ER 0%, PR 0%, Ki-67 5%, HER-2 negative ratio 0.83   06/11/2015 Surgery Left lumpectomy: DCIS, left additional margin: IDC 1.3 cm + DCIS 1/4 LN positive, grade 2, margins negative, LVI present, ER 0%, PR 0%, HER-2 negative ratio 0.60, Ki-67 5%, T1cN1a stage II a, Mammaprint high risk luminal B   07/20/2015 - 09/21/2015 Chemotherapy Taxotere and Cytoxan adjuvant chemotherapy  4 cycles    CHIEF COMPLIANT: cycle for [facility] last chemotherapy  INTERVAL HISTORY: Jessica Mullins is a 82 year old with above-mentioned history of triple negative left breast cancer who received adjuvant chemotherapy. Today's cycle 6 of adjuvant chemotherapy. She tolerated chemotherapy extremely well. She does not have any neuropathy. Denied any nausea or vomiting.  REVIEW OF SYSTEMS:   Constitutional: Denies fevers, chills or abnormal weight loss Eyes: Denies blurriness of vision Ears, nose, mouth, throat, and face: Denies mucositis or sore throat Respiratory: Denies cough, dyspnea or wheezes Cardiovascular: Denies palpitation, chest discomfort Gastrointestinal:  Denies nausea, heartburn or change in bowel habits Skin: Denies abnormal skin rashes Lymphatics: Denies new lymphadenopathy or easy bruising Neurological:Denies numbness, tingling or new weaknesses Behavioral/Psych: Mood is stable, no new changes  Extremities: No lower extremity edema Breast:  denies any pain or lumps or nodules in either breasts All other systems were reviewed  with the patient and are negative.  I have reviewed the past medical history, past surgical history, social history and family history with the patient and they are unchanged from previous note.  ALLERGIES:  is allergic to sulfa antibiotics; tape; glucosamine forte; and latex.  MEDICATIONS:  Current Outpatient Prescriptions  Medication Sig Dispense Refill  . alendronate (FOSAMAX) 70 MG tablet TAKE 1 TABLET (70 MG TOTAL) BY MOUTH EVERY SEVEN DAYS. TAKE WITH A FULL GLASS OF WATER AT NOON TIME ON EMPTY STOMACH. 4 tablet 11  . apixaban (ELIQUIS) 5 MG TABS tablet Take 1 tablet (5 mg total) by mouth 2 (two) times daily. 60 tablet 3  . Biotin (BIOTIN 5000) 5 MG CAPS Take 5 mg by mouth daily.     . Calcium Carbonate-Vitamin D (CALTRATE 600+D) 600-400 MG-UNIT per tablet Take 1 tablet by mouth daily.     . Cholecalciferol (VITAMIN D3) 1000 UNITS CAPS Take 1 capsule by mouth daily.      [name] diltiazem (CARDIZEM CD) 180 MG 24 hr capsule Take 1 capsule (180 mg total) by mouth daily. 30 capsule 3  . flecainide (TAMBOCOR) 50 MG tablet Take 50 mg by mouth 2 (two) times daily.    [name] lidocaine-prilocaine (EMLA) cream Apply to affected area once (Patient not taking: Reported on 08/25/2015) 30 g 3  . Multiple Vitamins-Minerals (MULTIVITAMIN ADULT PO) Take 1 tablet by mouth 2 (two) times daily.     . Omega-3 Fatty Acids (FISH OIL PO) Take 1 tablet by mouth daily.     [name] omeprazole (PRILOSEC) 20 MG capsule TAKE 1 CAPSULE (20 MG TOTAL) BY MOUTH DAILY. 30 capsule 11  . ondansetron (ZOFRAN) 8 MG tablet Take 8 mg by mouth every 8 (  eight) hours as needed for nausea. Reported on 08/25/2015    . pravastatin (PRAVACHOL) 40 MG tablet TAKE 1 TABLET (40 MG TOTAL) BY MOUTH AT BEDTIME. 90 tablet 0  . Propylene Glycol 0.6 % SOLN Apply 1 drop to eye daily as needed (dry eyes).     . Psyllium (METAMUCIL PO) Take 15 mLs by mouth daily.     . RESTASIS 0.05 % ophthalmic emulsion Place 1 drop into both eyes 2 (two) times daily.    .  Turmeric 500 MG CAPS Take 1 capsule by mouth daily.      No current facility-administered medications for this visit.    PHYSICAL EXAMINATION: ECOG PERFORMANCE STATUS: 0 - Asymptomatic  Filed Vitals:   09/21/15 1333  BP: 150/73  Pulse: 77  Temp: 98.2 F (36.8 C)  Resp: 18   Filed Weights   09/21/15 1333  Weight: 156 lb 14.4 oz (71.169 kg)    GENERAL:alert, no distress and comfortable SKIN: skin color, texture, turgor are normal, no rashes or significant lesions EYES: normal, Conjunctiva are pink and non-injected, sclera clear OROPHARYNX:no exudate, no erythema and lips, buccal mucosa, and tongue normal  NECK: supple, thyroid normal size, non-tender, without nodularity LYMPH:  no palpable lymphadenopathy in the cervical, axillary or inguinal LUNGS: clear to auscultation and percussion with normal breathing effort HEART: regular rate & rhythm and no murmurs and no lower extremity edema ABDOMEN:abdomen soft, non-tender and normal bowel sounds MUSCULOSKELETAL:no cyanosis of digits and no clubbing  NEURO: alert & oriented x 3 with fluent speech, no focal motor/sensory deficits EXTREMITIES: No lower extremity edema  LABORATORY DATA:  I have reviewed the data as listed   Chemistry      Component Value Date/Time   NA 138 08/31/2015 1255   NA 143 07/15/2015 1220   K 3.9 08/31/2015 1255   K 4.4 07/15/2015 1220   CL 107 07/15/2015 1220   CO2 27 08/31/2015 1255   CO2 26 07/15/2015 1220   BUN 14.4 08/31/2015 1255   BUN 15 07/15/2015 1220   CREATININE 0.8 08/31/2015 1255   CREATININE 0.67 07/15/2015 1220      Component Value Date/Time   CALCIUM 9.4 08/31/2015 1255   CALCIUM 9.7 07/15/2015 1220   ALKPHOS 89 08/31/2015 1255   ALKPHOS 57 06/10/2015 0944   AST 18 08/31/2015 1255   AST 23 06/10/2015 0944   ALT 15 08/31/2015 1255   ALT 23 06/10/2015 0944   BILITOT 0.38 08/31/2015 1255   BILITOT 0.7 06/10/2015 0944      Lab Results  Component Value Date   WBC 12.6*  09/21/2015   HGB 13.2 09/21/2015   HCT 40.3 09/21/2015   MCV 88.0 09/21/2015   PLT 282 09/21/2015   NEUTROABS 10.0* 09/21/2015   ASSESSMENT & PLAN:  Cancer of central portion of left female breast ([place]) Left lumpectomy 06/11/2015: DCIS, left additional margin: IDC 1.3 cm + DCIS 1/4 LN positive, grade 2, margins negative, LVI present, ER 0%, PR 0%, HER-2 negative ratio 0.60, Ki-67 5%, T1cN1a stage II a , Mammaprint high risk ( 5 year risk 22%,10 year risk 29%) luminal type B, Distant metastasis free survival at 5 years 88% with chemotherapy.  Treatment Plan: 1. Adjuvant chemotherapy with Taxotere and Cytoxan 4 cycles 2. Followed by adjuvant radiation therapy ----------------------------------------------------------------------------------------------------------------------- Current Treatment: Cycle 4 day 1 Taxotere Cytoxan (last cycle of chemotherapy) Chemo Toxicities: 1. Neulasta related bone pain: Discontinue Neulasta 2. Leukocytosis related to Neulasta: I will discontinue Neulasta from cycle 2. 3.  Constipation improved with stool softeners and laxatives 4. Taste changes: Cannot tolerate sweet stuff 5. Dizziness: Related to atrial fibrillation  6. Alopecia 7.Atrial fibrillation 08/10/2015:We did cause the patient dizziness and lightheadedness. Spontaneously cardioverted. Currently on flecainide and Eliquis.  I will refer her to radiation therapy.   No orders of the defined types were placed in this encounter.   The patient has a good understanding of the overall plan. she agrees with it. she will call with any problems that may develop before the next visit here.   Rulon Eisenmenger, MD 09/21/2015

## 2015-09-21 NOTE — Assessment & Plan Note (Signed)
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  Treatment Plan: 1. Adjuvant chemotherapy with Taxotere and Cytoxan 4 cycles 2. Followed by adjuvant radiation therapy ----------------------------------------------------------------------------------------------------------------------- Current Treatment: Cycle 4 day 1 Taxotere Cytoxan (last cycle of chemotherapy) Chemo Toxicities: 1. Neulasta related bone pain: Discontinue Neulasta 2. Leukocytosis related to Neulasta: I will discontinue Neulasta from cycle 2. 3. Constipation improved with stool softeners and laxatives 4. Taste changes: Cannot tolerate sweet stuff 5. Dizziness: Related to atrial fibrillation  6. Alopecia 7.Atrial fibrillation 08/10/2015:We did cause the patient dizziness and lightheadedness. Spontaneously cardioverted. Currently on flecainide and Eliquis.  I will refer her to radiation therapy.

## 2015-09-21 NOTE — Patient Instructions (Signed)
[facility] Discharge Instructions for Patients Receiving Chemotherapy  Today you received the following chemotherapy agents :  Taxotere/Cytoxan  To help prevent nausea and vomiting after your treatment, we encourage you to take your nausea medication HOME. If you develop nausea and vomiting that is not controlled by your nausea medication, call the clinic.   BELOW ARE SYMPTOMS THAT SHOULD BE REPORTED IMMEDIATELY:  *FEVER GREATER THAN 100.5 F  *CHILLS WITH OR WITHOUT FEVER  NAUSEA AND VOMITING THAT IS NOT CONTROLLED WITH YOUR NAUSEA MEDICATION  *UNUSUAL SHORTNESS OF BREATH  *UNUSUAL BRUISING OR BLEEDING  TENDERNESS IN MOUTH AND THROAT WITH OR WITHOUT PRESENCE OF ULCERS  *URINARY PROBLEMS  *BOWEL PROBLEMS  UNUSUAL RASH Items with * indicate a potential emergency and should be followed up as soon as possible.  Feel free to call the clinic you have any questions or concerns. The clinic phone number is [phone].  Please show the [place] at check-in to the Emergency Department and triage nurse.

## 2015-09-22 ENCOUNTER — Other Ambulatory Visit: Payer: Self-pay | Admitting: *Deleted

## 2015-09-22 ENCOUNTER — Telehealth: Payer: Self-pay

## 2015-09-22 NOTE — Telephone Encounter (Signed)
Spoke with Tiffany - ok to hold Eliquis for 2 days for port removal per MD.  Jessica Mullins will notify patient.

## 2015-09-22 NOTE — Addendum Note (Signed)
Addended by: Prentiss Bells on: 09/22/2015 09:09 AM   Modules accepted: Orders

## 2015-09-29 DIAGNOSIS — H353132 Nonexudative age-related macular degeneration, bilateral, intermediate dry stage: Secondary | ICD-10-CM | POA: Diagnosis not present

## 2015-09-29 DIAGNOSIS — H04123 Dry eye syndrome of bilateral lacrimal glands: Secondary | ICD-10-CM | POA: Diagnosis not present

## 2015-09-29 DIAGNOSIS — Z961 Presence of intraocular lens: Secondary | ICD-10-CM | POA: Diagnosis not present

## 2015-10-01 ENCOUNTER — Telehealth: Payer: Self-pay | Admitting: *Deleted

## 2015-10-01 NOTE — Telephone Encounter (Signed)
Received call from patient stating her nails are changing colors and may be lifting some.  Denies any foul odor or discharge.  Instructed her to use warm water, liquid Dawn soap and vinegar to soak her nails. Informed her that she developed any odor or discharge to call back and let us know.  She may need an antibiotic.  Patient verbalized understanding.

## 2015-10-02 DIAGNOSIS — L608 Other nail disorders: Secondary | ICD-10-CM | POA: Diagnosis not present

## 2015-10-03 ENCOUNTER — Other Ambulatory Visit: Payer: Self-pay | Admitting: Family Medicine

## 2015-10-09 NOTE — Progress Notes (Signed)
Mrs.  is here for a  follow up  new visit for breast cancer  Of central portion of left breast.  Skin status: Have you seen your surgeon for follow up? Have you seen your medical oncologist? Date If not ,when is appointment? Arm movement: Appetite: Pain: Energy level:

## 2015-10-12 DIAGNOSIS — Z853 Personal history of malignant neoplasm of breast: Secondary | ICD-10-CM | POA: Diagnosis not present

## 2015-10-13 NOTE — Progress Notes (Signed)
  Diagnosis 06-11-15 1. Breast, lumpectomy, Left - DUCTAL CARCINOMA IN SITU. 2. Lymph node, sentinel, biopsy, left axillary - ONE BENIGN LYMPH NODE (0/1). 3. Breast, excision, left additional medial margin - BENIGN BREAST TISSUE AND BENIGN SKELETAL MUSCLE. - NO MALIGNANCY IDENTIFIED. 4. Breast, excision, left additional inferior margin - INVASIVE AND IN SITU DUCTAL CARCINOMA, 1.3 CM. - MARGINS NOT INVOLVED. - CLOSEST MARGIN INFERIOR AT 1.0 CM. - BIOPSY SITE REACTION. 5. Breast, excision, left second additional inferior margin - BENIGN BREAST TISSUE. - NO MALIGNANCY IDENTIFIED. 6. Breast, excision, left additional deep margin - BENIGN ADIPOSE TISSUE. - NO MALIGNANCY IDENTIFIED. 7. Breast, excision, left additional lateral margin - FIBROCYSTIC CHANGES WITH FOCAL USUAL DUCTAL HYPERPLASIA. - ATYPICAL LOBULAR HYPERPLASIA (LOBULAR NEOPLASIA). - MARGINS NOT INVOLVED. 8. Breast, excision, left additional superior margin - BENIGN ADIPOSE TISSUE. - NO MALIGNANCY IDENTIFIED. 9. Breast, excision, left additional anterior margin - FIBROCYSTIC CHANGES. - NO MALIGNANCY IDENTIFIED. 10. Lymph node, sentinel, biopsy, left axillary - ONE BENIGN LYMPH NODE (0/1). 11. Lymph node, sentinel, biopsy, left axillary 2 of 6 FINAL for [place], Takeia L [id number]) Diagnosis(continued) - ONE BENIGN LYMPH NODE (0/1). 12. Lymph node, sentinel, biopsy, left axillary - METASTATIC CARCINOMA IN ONE LYMPH NODE (1/1). - NO EXTRANODAL TUMOR. Microscopic Comment  .   07/20/2015 - 09/21/2015 Chemotherapy Taxotere and Cytoxan adjuvant chemotherapy 4 cycles        Atrial fibrillation 08/10/2015 Pain:No Numbness:Both hands Lymphedema:No Fatigue:None Having bleeding from incision to left breast started yesterday during the night light bloody drainage small amount.  Changed 4x4's. BP 147/78 mmHg  Pulse 79  Temp(Src) 98 F (36.7 C) (Oral)  Resp 16  Ht 5' 0.5" (1.537 m)  Wt 150 lb 14.4 oz (68.448  kg)  BMI 28.97 kg/m2  SpO2 100%

## 2015-10-14 ENCOUNTER — Ambulatory Visit
Admission: RE | Admit: 2015-10-14 | Discharge: 2015-10-14 | Disposition: A | Discharge status: Home / Self Care | Payer: Medicare PPO | Source: Ambulatory Visit | Attending: Radiation Oncology | Admitting: Radiation Oncology

## 2015-10-14 ENCOUNTER — Encounter: Payer: Self-pay | Admitting: Radiation Oncology

## 2015-10-14 VITALS — BP 147/78 | HR 79 | Temp 98.0°F | Resp 16 | Ht 60.5 in | Wt 150.9 lb

## 2015-10-14 DIAGNOSIS — C50112 Malignant neoplasm of central portion of left female breast: Secondary | ICD-10-CM

## 2015-10-14 MED ORDER — DOXYCYCLINE HYCLATE 100 MG PO TABS: 100.0000 mg | ORAL_TABLET | Freq: Two times a day (BID) | ORAL | Status: AC

## 2015-10-14 NOTE — Progress Notes (Signed)
Department of Radiation Oncology  Phone:  [phone] Fax:        [phone]   Name: Jessica Mullins MRN: [id number]  DOB: [birth year]  Date: 10/14/2015  Follow Up Visit Note  Diagnosis: T1bN0 Stage I Triple Negative Left Breast Cancer  Prior radiation: No  Interval History: Jessica Mullins presents today for followup prior to radiation treatment. I initially met with her on 06/03/2015. She had her lumpectomy on 06/11/2015. This showed invasive and in situ ductal carcinoma measuring 1.3 cm. Margins were found to be negative. Closest margin inferior at 1.0 cm. Three lymph nodes were benign. Metastatic carcinoma was found in one lymph node. The patient has completed chemotherapy treatment and is here to discuss radiation in the management of her disease.   Today, she denies any pain, lymphedema, or fatigue. She reports numbness in both hands. She reports that she has been experiencing a productive cough. She has also been producing nasal mucous that contains blood "clots" over the past few weeks. She reports fluid drainage from lumpectomy incision to the left breast for the past 3 days since she saw Dr. Brantley Stage. She is having mild discomfort associated with this but no fevers or chills.  This had cleared up completely for about 6 weeks but now has restarted. She states that she woke up Tuesday morning with her bra "saturated". The nurse noted a small amount of light bloody drainage and changed her 4x4's today.   Physical Exam:  Filed Vitals:   10/14/15 1542  BP: 147/78  Pulse: 79  Temp: 98 F (36.7 C)  TempSrc: Oral  Resp: 16  Height: 5' 0.5" (1.537 m)  Weight: 150 lb 14.4 oz (68.448 kg)  SpO2: 100%   This is a well appearing female in no acute distress. She is alert and oriented. She has no discharge from her surgical incision in the lateral aspect of the breast, but she does have hardness over that side with some erythema.   IMPRESSION: Jessica Mullins is a 82 y.o. female with T1bN0 Stage I Triple  Negative left breast cancer.  PLAN: Jessica Mullins has completed chemotherapy treatment. She is having her port-a-cath removed on Wednesday so I put her on Doxycycline for a week to see if we can help clear up some of this drainage and redness.  I will see her back on Thursday, 10/22/2015, to ensure that lumpectomy incision draining has subsided prior to proceeding with CT simulation. If draining has subsided, CT simulation will occur on 10/22/2015 at 2:00 PM. If drainage is present, we may need to delay treatment.   I spoke to her today regarding her diagnosis and options for treatment. We discussed the equivalence in terms of survival and local failure between mastectomy and breast conservation. We discussed the role of radiation in decreasing local failures in patients who undergo lumpectomy. We discussed the process of simulation and the placement tattoos. We discussed 6 weeks of treatment as an outpatient. We discussed the possibility of asymptomatic lung damage. We discussed the low likelihood of secondary malignancies. We discussed the possible side effects including but not limited to skin redness, fatigue, permanent skin darkening, and breast swelling. We discussed the use of cardiac sparing with deep inspiration breath hold if needed. She has signed informed consent and is prepared to proceed with radiation treatment.    --------------------------------------0----------  Thea Silversmith, MD  This document serves as a record of services personally performed by Thea Silversmith, MD. It was created on her behalf by Jenell Milliner, a  trained medical scribe. The creation of this record is based on the scribe's personal observations and the provider's statements to them. This document has been checked and approved by the attending provider.

## 2015-10-15 ENCOUNTER — Encounter: Payer: Self-pay | Admitting: *Deleted

## 2015-10-15 NOTE — Addendum Note (Signed)
Encounter addended by: Malena Edman, RN on: 10/15/2015  9:11 AM<BR>     Documentation filed: Charges VN

## 2015-10-16 ENCOUNTER — Telehealth: Payer: Self-pay | Admitting: Surgery

## 2015-10-16 NOTE — Telephone Encounter (Signed)
Jessica Mullins has had drainage from her lumpectomy site.  Her lumpectomy was by Dr. Brantley Stage on 06/10/2016.  She actually just saw Dr. Brantley Stage, but the wound was doing okay.  She is not running a fever and the wound does not look infected.  Though she did see Dr. Pablo Ledger this week and Dr. Pablo Ledger saw some drainage and put her on antibiotics.  I offered her to come to the ER or call our office Monday AM.  It does not sound like she needs to come to the ER and she should do okay until Monday.  She is a little exacerbated.  Alphonsa Overall, MD, [facility] Surgery Pager: [phone] Office phone:  [phone]

## 2015-10-18 ENCOUNTER — Other Ambulatory Visit: Payer: Self-pay | Admitting: Radiology

## 2015-10-19 ENCOUNTER — Other Ambulatory Visit: Payer: Self-pay | Admitting: Physician Assistant

## 2015-10-19 DIAGNOSIS — C50912 Malignant neoplasm of unspecified site of left female breast: Secondary | ICD-10-CM | POA: Diagnosis not present

## 2015-10-19 DIAGNOSIS — N6489 Other specified disorders of breast: Secondary | ICD-10-CM | POA: Diagnosis not present

## 2015-10-20 ENCOUNTER — Other Ambulatory Visit: Payer: Self-pay | Admitting: Radiology

## 2015-10-20 DIAGNOSIS — Z08 Encounter for follow-up examination after completed treatment for malignant neoplasm: Secondary | ICD-10-CM | POA: Diagnosis not present

## 2015-10-20 DIAGNOSIS — Z1283 Encounter for screening for malignant neoplasm of skin: Secondary | ICD-10-CM | POA: Diagnosis not present

## 2015-10-20 DIAGNOSIS — Z85820 Personal history of malignant melanoma of skin: Secondary | ICD-10-CM | POA: Diagnosis not present

## 2015-10-20 DIAGNOSIS — L821 Other seborrheic keratosis: Secondary | ICD-10-CM | POA: Diagnosis not present

## 2015-10-21 ENCOUNTER — Ambulatory Visit (HOSPITAL_COMMUNITY)
Admission: RE | Admit: 2015-10-21 | Discharge: 2015-10-21 | Disposition: A | Discharge status: Home / Self Care | Payer: Medicare PPO | Source: Ambulatory Visit | Attending: Hematology and Oncology | Admitting: Hematology and Oncology

## 2015-10-21 ENCOUNTER — Encounter (HOSPITAL_COMMUNITY): Payer: Self-pay

## 2015-10-21 DIAGNOSIS — Z79899 Other long term (current) drug therapy: Secondary | ICD-10-CM | POA: Insufficient documentation

## 2015-10-21 DIAGNOSIS — Z452 Encounter for adjustment and management of vascular access device: Secondary | ICD-10-CM | POA: Diagnosis not present

## 2015-10-21 DIAGNOSIS — E785 Hyperlipidemia, unspecified: Secondary | ICD-10-CM | POA: Insufficient documentation

## 2015-10-21 DIAGNOSIS — I10 Essential (primary) hypertension: Secondary | ICD-10-CM | POA: Insufficient documentation

## 2015-10-21 DIAGNOSIS — Z853 Personal history of malignant neoplasm of breast: Secondary | ICD-10-CM | POA: Diagnosis not present

## 2015-10-21 DIAGNOSIS — K219 Gastro-esophageal reflux disease without esophagitis: Secondary | ICD-10-CM | POA: Diagnosis not present

## 2015-10-21 DIAGNOSIS — Z7902 Long term (current) use of antithrombotics/antiplatelets: Secondary | ICD-10-CM | POA: Diagnosis not present

## 2015-10-21 DIAGNOSIS — C50112 Malignant neoplasm of central portion of left female breast: Secondary | ICD-10-CM

## 2015-10-21 DIAGNOSIS — Z5111 Encounter for antineoplastic chemotherapy: Secondary | ICD-10-CM | POA: Diagnosis not present

## 2015-10-21 LAB — CBC WITH DIFFERENTIAL/PLATELET
Basophils Absolute: 0 10*3/uL (ref 0.0–0.1)
Basophils Relative: 1 %
Eosinophils Absolute: 0.1 10*3/uL (ref 0.0–0.7)
Eosinophils Relative: 1 %
HCT: 37.9 % (ref 36.0–46.0)
Hemoglobin: 12.7 g/dL (ref 12.0–15.0)
Lymphocytes Relative: 16 %
Lymphs Abs: 1.3 10*3/uL (ref 0.7–4.0)
MCH: 28.9 pg (ref 26.0–34.0)
MCHC: 33.5 g/dL (ref 30.0–36.0)
MCV: 86.1 fL (ref 78.0–100.0)
Monocytes Absolute: 0.8 10*3/uL (ref 0.1–1.0)
Monocytes Relative: 9 %
Neutro Abs: 5.9 10*3/uL (ref 1.7–7.7)
Neutrophils Relative %: 73 %
Platelets: 294 10*3/uL (ref 150–400)
RBC: 4.4 MIL/uL (ref 3.87–5.11)
RDW: 16.2 % — ABNORMAL HIGH (ref 11.5–15.5)
WBC: 8 10*3/uL (ref 4.0–10.5)

## 2015-10-21 LAB — PROTIME-INR
INR: 1.2 (ref 0.00–1.49)
Prothrombin Time: 15.4 seconds — ABNORMAL HIGH (ref 11.6–15.2)

## 2015-10-21 MED ORDER — LIDOCAINE HCL 1 % IJ SOLN
INTRAMUSCULAR | Status: AC
  Filled 2015-10-21: qty 20

## 2015-10-21 MED ORDER — LIDOCAINE HCL 1 % IJ SOLN
INTRAMUSCULAR | Status: AC | PRN
  Administered 2015-10-21: 10 mL

## 2015-10-21 MED ORDER — FENTANYL CITRATE (PF) 100 MCG/2ML IJ SOLN
INTRAMUSCULAR | Status: AC
  Filled 2015-10-21: qty 2

## 2015-10-21 MED ORDER — SODIUM CHLORIDE 0.9 % IV SOLN
INTRAVENOUS | Status: DC
  Administered 2015-10-21: 12:00:00 via INTRAVENOUS

## 2015-10-21 MED ORDER — CEFAZOLIN SODIUM-DEXTROSE 2-4 GM/100ML-% IV SOLN
2.0000 g | INTRAVENOUS | Status: AC
  Administered 2015-10-21: 2 g via INTRAVENOUS
  Filled 2015-10-21: qty 100

## 2015-10-21 MED ORDER — FENTANYL CITRATE (PF) 100 MCG/2ML IJ SOLN
INTRAMUSCULAR | Status: AC | PRN
  Administered 2015-10-21 (×2): 25 ug via INTRAVENOUS

## 2015-10-21 MED ORDER — MIDAZOLAM HCL 2 MG/2ML IJ SOLN
INTRAMUSCULAR | Status: AC | PRN
  Administered 2015-10-21: 1 mg via INTRAVENOUS
  Administered 2015-10-21: 0.5 mg via INTRAVENOUS

## 2015-10-21 MED ORDER — MIDAZOLAM HCL 2 MG/2ML IJ SOLN
INTRAMUSCULAR | Status: AC
  Filled 2015-10-21: qty 4

## 2015-10-21 NOTE — Sedation Documentation (Signed)
Patient denies pain and is resting comfortably.  

## 2015-10-21 NOTE — Discharge Instructions (Signed)
Incision Care °An incision is when a surgeon cuts into your body. After surgery, the incision needs to be cared for properly to prevent infection.  °HOW TO CARE FOR YOUR INCISION °· Take medicines only as directed by your health care provider. °· There are many different ways to close and cover an incision, including stitches, skin glue, and adhesive strips. Follow your health care provider's instructions on: °¨ Incision care. °¨ Bandage (dressing) changes and removal. °¨ Incision closure removal. °· Do not take baths, swim, or use a hot tub until your health care provider approves. You may shower as directed by your health care provider. °· Resume your normal diet and activities as directed. °· Use anti-itch medicine (such as an antihistamine) as directed by your health care provider. The incision may itch while it is healing. Do not pick or scratch at the incision. °· Drink enough fluid to keep your urine clear or pale yellow. °SEEK MEDICAL CARE IF:  °· You have drainage, redness, swelling, or pain at your incision site. °· You have muscle aches, chills, or a general ill feeling. °· You notice a bad smell coming from the incision or dressing. °· Your incision edges separate after the sutures, staples, or skin adhesive strips have been removed. °· You have persistent nausea or vomiting. °· You have a fever. °· You are dizzy. °SEEK IMMEDIATE MEDICAL CARE IF:  °· You have a rash. °· You faint. °· You have difficulty breathing. °MAKE SURE YOU:  °· Understand these instructions. °· Will watch your condition. °· Will get help right away if you are not doing well or get worse. °  °This information is not intended to replace advice given to you by your health care provider. Make sure you discuss any questions you have with your health care provider. °  °Document Released: 12/31/2004 Document Revised: 07/04/2014 Document Reviewed: 08/07/2013 °Elsevier Interactive Patient Education ©2016 [employer]vier Inc[employer] ° ° °Moderate  Conscious Sedation, Adult, Care After °Refer to this sheet in the next few weeks. These instructions provide you with information on caring for yourself after your procedure. Your health care provider may also give you more specific instructions. Your treatment has been planned according to current medical practices, but problems sometimes occur. Call your health care provider if you have any problems or questions after your procedure. °WHAT TO EXPECT AFTER THE PROCEDURE  °After your procedure: °· You may feel sleepy, clumsy, and have poor balance for several hours. °· Vomiting may occur if you eat too soon after the procedure. °HOME CARE INSTRUCTIONS °· Do not participate in any activities where you could become injured for at least 24 hours. Do not: °¨ Drive. °¨ Swim. °¨ Ride a bicycle. °¨ Operate heavy machinery. °¨ Cook. °¨ Use power tools. °¨ Climb ladders. °¨ Work from a high place. °· Do not make important decisions or sign legal documents until you are improved. °· If you vomit, drink water, juice, or soup when you can drink without vomiting. Make sure you have little or no nausea before eating solid foods. °· Only take over-the-counter or prescription medicines for pain, discomfort, or fever as directed by your health care provider. °· Make sure you and your family fully understand everything about the medicines given to you, including what side effects may occur. °· You should not drink alcohol, take sleeping pills, or take medicines that cause drowsiness for at least 24 hours. °· If you smoke, do not smoke without supervision. °· If you are feeling   better, you may resume normal activities 24 hours after you were sedated. °· Keep all appointments with your health care provider. °SEEK MEDICAL CARE IF: °· Your skin is pale or bluish in color. °· You continue to feel nauseous or vomit. °· Your pain is getting worse and is not helped by medicine. °· You have bleeding or swelling. °· You are still sleepy or  feeling clumsy after 24 hours. °SEEK IMMEDIATE MEDICAL CARE IF: °· You develop a rash. °· You have difficulty breathing. °· You develop any type of allergic problem. °· You have a fever. °MAKE SURE YOU: °· Understand these instructions. °· Will watch your condition. °· Will get help right away if you are not doing well or get worse. °  °This information is not intended to replace advice given to you by your health care provider. Make sure you discuss any questions you have with your health care provider. °  °Document Released: 04/03/2013 Document Revised: 07/04/2014 Document Reviewed: 04/03/2013 °Elsevier Interactive Patient Education ©2016 Elsevier Inc. ° °

## 2015-10-21 NOTE — H&P (Signed)
Chief Complaint: breast cancer, needs PAC removed  Referring Physician:Dr. Nicholas Lose  Supervising Physician: Jacqulynn Cadet  Patient Status: Out-pt  HPI: Jessica Mullins is an 82 y.o. female with a diagnosis of breast cancer.  She underwent a lumpectomy in December of 2016 by Dr. Brantley Stage.  She has done well from this and has completed her chemotherapy.  She has been having some drainage from this site recently.  She saw Dr. Zella Richer in the office this week and he aspirated this old lumpectomy site.  She has had no further issues with this. She denies any fevers or purulent drainage.  She has completed her chemo and a request has been made for a PAC removal.  She will start radiation next week.  Past Medical History:  Past Medical History  Diagnosis Date  . Hypertension   . Hyperlipidemia   . Bursitis of right shoulder   . Cataract   . Skin cancer 2000    melanoma and basal cell  . Breast cancer ([place]) 1115/16    left   . Allergy   . Vaso vagal episode     during preparation for colonoscopy  . GERD (gastroesophageal reflux disease)   . Fracture of right wrist   . Ankle fracture, left   . Family history of cancer     Past Surgical History:  Past Surgical History  Procedure Laterality Date  . Appendectomy    . Ganglion cyst excision      L  hand  . Breast lumpectomy      B/L--FCS  . Lipiflow procedure    . Cataract extraction  2015  . Abdominal hysterectomy  1988    TAH/BSO--FIBROIDS  . Fiberadenoma Bilateral 1978, 1980  . Colonoscopy  2010  . Dg  bone density ([facility] hx)    . Eye surgery Bilateral     cataracts  . Breast lumpectomy with radioactive seed and sentinel lymph node biopsy Left 06/11/2015    Procedure: LEFT BREAST LUMPECTOMY WITH RADIOACTIVE SEED AND LEFT SENTINEL LYMPH NODE MAPPING;  Surgeon: Erroll Luna, MD;  Location: [facility];  Service: General;  Laterality: Left;    Family History:  Family History  Problem Relation Age of Onset  . Lung  cancer    . Thrombosis      thromboembolism clotting disorder--? father died of ? clot   . Cancer Mother     mets to bone--? primary  . Coronary artery disease Brother     died of M! @ 62  . Diabetes Brother   . Hyperlipidemia Brother   . Hypertension Brother   . Heart disease Brother     chf  . Heart disease Father 60    MI  . Lung cancer Sister 1    former smoker  . Diabetes Maternal Aunt     Social History:  reports that she has never smoked. She has never used smokeless tobacco. She reports that she does not drink alcohol or use illicit drugs.  Allergies:  Allergies  Allergen Reactions  . Sulfa Antibiotics Other (See Comments)    As a child, rigid as a stick and not responsive  . Tape Other (See Comments)    Blisters, Please use "paper" tape only for short periods of time  . Glucosamine Forte [Nutritional Supplements] Rash  . Latex Rash    Medications:   Medication List    ASK your doctor about these medications        alendronate 70 MG tablet  Commonly known as:  FOSAMAX  TAKE 1 TABLET (70 MG TOTAL) BY MOUTH EVERY SEVEN DAYS. TAKE WITH A FULL GLASS OF WATER AT NOON TIME ON EMPTY STOMACH.     apixaban 5 MG Tabs tablet  Commonly known as:  ELIQUIS  Take 1 tablet (5 mg total) by mouth 2 (two) times daily.     BIOTIN 5000 5 MG Caps  Generic drug:  Biotin  Take 5 mg by mouth daily.     CALTRATE 600+D 600-400 MG-UNIT tablet  Generic drug:  Calcium Carbonate-Vitamin D  Take 1 tablet by mouth daily.     diltiazem 180 MG 24 hr capsule  Commonly known as:  CARDIZEM CD  Take 1 capsule (180 mg total) by mouth daily.     doxycycline 100 MG tablet  Commonly known as:  VIBRA-TABS  Take 1 tablet (100 mg total) by mouth 2 (two) times daily.     FISH OIL PO  Take 1 tablet by mouth daily.     flecainide 50 MG tablet  Commonly known as:  TAMBOCOR  Take 50 mg by mouth 2 (two) times daily.     lidocaine-prilocaine cream  Commonly known as:  EMLA  Apply to  affected area once     METAMUCIL PO  Take 15 mLs by mouth daily.     MULTIVITAMIN ADULT PO  Take 1 tablet by mouth 2 (two) times daily.     omeprazole 20 MG capsule  Commonly known as:  PRILOSEC  TAKE 1 CAPSULE (20 MG TOTAL) BY MOUTH DAILY.     ondansetron 8 MG tablet  Commonly known as:  ZOFRAN  Take 8 mg by mouth every 8 (eight) hours as needed for nausea. Reported on 10/14/2015     pravastatin 40 MG tablet  Commonly known as:  PRAVACHOL  Take 1 tablet (40 mg total) by mouth daily. Repeat labs are due now     Propylene Glycol 0.6 % Soln  Apply 1 drop to eye daily as needed (dry eyes).     RESTASIS 0.05 % ophthalmic emulsion  Generic drug:  cycloSPORINE  Place 1 drop into both eyes 2 (two) times daily.     Turmeric 500 MG Caps  Take 1 capsule by mouth daily.     Vitamin D3 1000 units Caps  Take 1 capsule by mouth daily.        Please HPI for pertinent positives, otherwise complete 10 system ROS negative.  Mallampati Score: MD Evaluation Airway: WNL Heart: WNL Abdomen: WNL Chest/ Lungs: WNL ASA  Classification: 3 Mallampati/Airway Score: Two  Physical Exam: There were no vitals taken for this visit. There is no weight on file to calculate BMI. General: pleasant, WD, WN white female who is laying in bed in NAD HEENT: head is normocephalic, atraumatic, head wrap in place.  Sclera are noninjected.  PERRL.  Ears and nose without any masses or lesions.  Mouth is pink and moist Heart: regular, rate, and rhythm.  Normal s1,s2. No obvious murmurs, gallops, or rubs noted.  Palpable radial and pedal pulses bilaterally Lungs: CTAB, no wheezes, rhonchi, or rales noted.  Respiratory effort nonlabored Abd: soft, NT, ND, +BS, no masses, hernias, or organomegaly Psych: A&Ox3 with an appropriate affect.   Labs: Results for orders placed or performed during the hospital encounter of 10/21/15 (from the past 48 hour(s))  CBC with Differential/Platelet     Status: Abnormal    Collection Time: 10/21/15 12:05 PM  Result Value Ref Range  WBC 8.0 4.0 - 10.5 K/uL   RBC 4.40 3.87 - 5.11 MIL/uL   Hemoglobin 12.7 12.0 - 15.0 g/dL   HCT 37.9 36.0 - 46.0 %   MCV 86.1 78.0 - 100.0 fL   MCH 28.9 26.0 - 34.0 pg   MCHC 33.5 30.0 - 36.0 g/dL   RDW 16.2 (H) 11.5 - 15.5 %   Platelets 294 150 - 400 K/uL   Neutrophils Relative % 73 %   Neutro Abs 5.9 1.7 - 7.7 K/uL   Lymphocytes Relative 16 %   Lymphs Abs 1.3 0.7 - 4.0 K/uL   Monocytes Relative 9 %   Monocytes Absolute 0.8 0.1 - 1.0 K/uL   Eosinophils Relative 1 %   Eosinophils Absolute 0.1 0.0 - 0.7 K/uL   Basophils Relative 1 %   Basophils Absolute 0.0 0.0 - 0.1 K/uL  Protime-INR     Status: Abnormal   Collection Time: 10/21/15 12:05 PM  Result Value Ref Range   Prothrombin Time 15.4 (H) 11.6 - 15.2 seconds   INR 1.20 0.00 - 1.49    Imaging: No results found.  Assessment/Plan 1. Breast cancer, needs PAC removed -patient does take Eliquis, but her last dose was on on Sunday 4-23. -her labs and vitals have been reviewed -we have discussed the procedure as well as risks and complications of port a cath removal, including, but not limited to bleeding and infection.  The patient understands and is agreeable to proceed.  Thank you for this interesting consult.  I greatly enjoyed meeting Zenab L Putzier and look forward to participating in their care.  A copy of this report was sent to the requesting provider on this date.  Electronically Signed: Henreitta Cea 10/21/2015, 1:29 PM   I spent a total of    30 minutes in face to face in clinical consultation, greater than 50% of which was counseling/coordinating care for breast cancer, PAC removal

## 2015-10-21 NOTE — Procedures (Signed)
Interventional Radiology Procedure Note  Procedure: Removal Right chest port  Complications: None  Estimated Blood Loss: None  Recommendations: - DC home  Signed,  Criselda Peaches, MD

## 2015-10-22 ENCOUNTER — Ambulatory Visit: Payer: Medicare PPO | Admitting: Radiation Oncology

## 2015-10-26 ENCOUNTER — Encounter: Payer: Self-pay | Admitting: *Deleted

## 2015-10-26 DIAGNOSIS — Z853 Personal history of malignant neoplasm of breast: Secondary | ICD-10-CM | POA: Diagnosis not present

## 2015-10-26 DIAGNOSIS — N6489 Other specified disorders of breast: Secondary | ICD-10-CM | POA: Diagnosis not present

## 2015-10-26 NOTE — Progress Notes (Signed)
[place] Psychosocial Distress Screening Clinical Social Work  Clinical Social Work was referred by distress screening protocol.  The patient scored a 8 on the Psychosocial Distress Thermometer which indicates severe distress. Clinical Social Worker reviewed chart to assess for distress and other psychosocial needs. Pt had filled out distress screen and per RN distress was related to physical symptom that Dr Pablo Ledger fully addressed at visit dated 10/14/15. Pt also was seen by her surgeon for further follow up. No other distress noted. CSW available to follow up on other needs as needed.   ONCBCN DISTRESS SCREENING 10/14/2015  Screening Type Change in Status  Distress experienced in past week (1-10) 8  Physical Problem type (No Data)  Physician notified of physical symptoms (No Data)    Clinical Social Worker follow up needed: No.  If yes, follow up plan:  Loren Racer, [place]  [facility] Phone: [phone] Fax: [phone]

## 2015-10-27 ENCOUNTER — Ambulatory Visit
Admission: RE | Admit: 2015-10-27 | Discharge: 2015-10-27 | Disposition: A | Discharge status: Home / Self Care | Payer: Medicare PPO | Source: Ambulatory Visit | Attending: Radiation Oncology | Admitting: Radiation Oncology

## 2015-10-27 DIAGNOSIS — C50112 Malignant neoplasm of central portion of left female breast: Secondary | ICD-10-CM

## 2015-10-27 NOTE — Progress Notes (Signed)
Radiation Oncology         [phone]) [phone] ________________________________  Name: Jessica Mullins      MRN: [id number]          Date: 10/27/2015              DOB: August 09, 1930  Optical Surface Tracking Plan:  Since intensity modulated radiotherapy (IMRT) and 3D conformal radiation treatment methods are predicated on accurate and precise positioning for treatment, intrafraction motion monitoring is medically necessary to ensure accurate and safe treatment delivery.  The ability to quantify intrafraction motion without excessive ionizing radiation dose can only be performed with optical surface tracking. Accordingly, surface imaging offers the opportunity to obtain 3D measurements of patient position throughout IMRT and 3D treatments without excessive radiation exposure.  I am ordering optical surface tracking for this patient's upcoming course of radiotherapy. ------------------------------------------------  Thea Silversmith, MD    Reference:   Particia Jasper, et al. Surface imaging-based analysis of intrafraction motion for breast radiotherapy patients.Journal of [place], n. 6, nov. 2014. ISSN [id number].   Available at: <http://www.jacmp.org/index.php/jacmp/article/view/4957>.    This document serves as a record of services personally performed by Thea Silversmith, MD. It was created on her behalf by Arlyce Harman, a trained medical scribe. The creation of this record is based on the scribe's personal observations and the provider's statements to them. This document has been checked and approved by the attending provider.

## 2015-10-27 NOTE — Progress Notes (Signed)
Name: Tejasvi L Muldoon   MRN: [id number]  Date:  10/27/2015  DOB: [birth year]  Status:outpatient   DIAGNOSIS: Left Breast cancer.  CONSENT VERIFIED: yes SET UP: Patient is setup supine  IMMOBILIZATION:  The following immobilization was used:Custom Moldable Pillow, breast board.  NARRATIVE: Ms. Laracuente was brought to the [place].  Identity was confirmed.  All relevant records and images related to the planned course of therapy were reviewed.  Then, the patient was positioned in a stable reproducible clinical set-up for radiation therapy.  Wires were placed to delineate the clinical extent of breast tissue. A wire was placed on the scar as well.  CT images were obtained.  An isocenter was placed. Skin markings were placed.  The position of the heart was then analyzed.  Due to the proximity of the heart to the chest wall, I felt she would benefit from deep inspiration breath hold for cardiac sparing.  She was then coached and rescanned in the breath hold position.  Acceptable cardiac sparing was achieved. The CT images were loaded into the planning software where the target and avoidance structures were contoured.  The radiation prescription was entered and confirmed. The patient was discharged in stable condition and tolerated simulation well.    TREATMENT PLANNING NOTE/3D Simulation Note Treatment planning then occurred. I have requested : MLC's, isodose plan, basic dose calculation  3D simulation was performed.  I personally designed and supervised the construction of 3 medically necessary complex treatment devices in the form of MLCs which will be used for beam modification and to protect critical structures including the heart and lung as well as the immobilization device which is necessary for reproducible set up.  I have requested a dose volume histogram of the heart, lung and tumor cavity.   Special treatment procedure was performed today due to the extra time and effort  required by myself to plan and prepare this patient for deep inspiration breath hold technique.  I have determined cardiac sparing to be of benefit to this patient to prevent long term cardiac damage due to radiation of the heart.  Bellows were placed on the patient's abdomen. To facilitate cardiac sparing, the patient was coached by the radiation therapists on breath hold techniques and breathing practice was performed. Practice waveforms were obtained. The patient was then scanned while maintaining breath hold in the treatment position.  This image was then transferred over to the imaging specialist. The imaging specialist then created a fusion of the free breathing and breath hold scans using the chest wall as the stable structure. I personally reviewed the fusion in axial, coronal and sagittal image planes.  Excellent cardiac sparing was obtained.  I felt the patient is an appropriate candidate for breath hold and the patient will be treated as such.  The image fusion was then reviewed with the patient to reinforce the necessity of reproducible breath hold.

## 2015-10-29 ENCOUNTER — Encounter: Payer: Self-pay | Admitting: *Deleted

## 2015-10-29 DIAGNOSIS — C50112 Malignant neoplasm of central portion of left female breast: Secondary | ICD-10-CM | POA: Diagnosis not present

## 2015-11-03 ENCOUNTER — Ambulatory Visit: Payer: Medicare PPO | Admitting: Radiation Oncology

## 2015-11-04 ENCOUNTER — Ambulatory Visit: Payer: Medicare PPO

## 2015-11-05 ENCOUNTER — Ambulatory Visit: Payer: Medicare PPO

## 2015-11-05 ENCOUNTER — Ambulatory Visit: Payer: Medicare PPO | Admitting: Radiation Oncology

## 2015-11-05 ENCOUNTER — Ambulatory Visit
Admission: RE | Admit: 2015-11-05 | Discharge: 2015-11-05 | Disposition: A | Discharge status: Home / Self Care | Payer: Medicare PPO | Source: Ambulatory Visit | Attending: Radiation Oncology | Admitting: Radiation Oncology

## 2015-11-05 DIAGNOSIS — C50912 Malignant neoplasm of unspecified site of left female breast: Secondary | ICD-10-CM | POA: Diagnosis not present

## 2015-11-05 DIAGNOSIS — N6489 Other specified disorders of breast: Secondary | ICD-10-CM | POA: Diagnosis not present

## 2015-11-06 ENCOUNTER — Encounter: Payer: Self-pay | Admitting: Family Medicine

## 2015-11-06 ENCOUNTER — Ambulatory Visit
Admission: RE | Admit: 2015-11-06 | Discharge: 2015-11-06 | Disposition: A | Discharge status: Home / Self Care | Payer: Medicare PPO | Source: Ambulatory Visit | Attending: Radiation Oncology | Admitting: Radiation Oncology

## 2015-11-06 ENCOUNTER — Ambulatory Visit: Payer: Medicare PPO

## 2015-11-06 ENCOUNTER — Ambulatory Visit: Admission: RE | Admit: 2015-11-06 | Payer: Medicare PPO | Source: Ambulatory Visit | Admitting: Radiation Oncology

## 2015-11-06 ENCOUNTER — Ambulatory Visit (INDEPENDENT_AMBULATORY_CARE_PROVIDER_SITE_OTHER): Payer: Medicare PPO | Admitting: Family Medicine

## 2015-11-06 ENCOUNTER — Ambulatory Visit: Payer: Medicare PPO | Admitting: Radiation Oncology

## 2015-11-06 VITALS — BP 134/85 | HR 72 | Temp 98.2°F | Ht 60.5 in | Wt 148.2 lb

## 2015-11-06 DIAGNOSIS — C50912 Malignant neoplasm of unspecified site of left female breast: Secondary | ICD-10-CM

## 2015-11-06 NOTE — Progress Notes (Signed)
Pre visit review using our clinic review tool, if applicable. No additional management support is needed unless otherwise documented below in the visit note. 

## 2015-11-06 NOTE — Patient Instructions (Signed)
We have put a new referral in for a surgeon at [place] at your request Delsa Sale should give you a call by next week If you do not hear anything by Friday , please call us

## 2015-11-06 NOTE — Progress Notes (Signed)
Subjective:    Patient ID: Jessica Mullins, female    DOB: 1930/10/21, 82 y.o.   MRN: [id number]  Chief Complaint  Patient presents with  . Breast Cancer Treatment    Pt has questions about breast cancer treatment. Pt was suppose to have radiation but the Doctor cancelled it, said it's no more he can do and is suggesting a masectomy. Would like to discuss possibly switching to a new doctor for breast cancer treatment.    HPI Patient is in today to discuss her surgeon and breast cancer.  She has had radiation cancelled many times and then cancelled all together and told she should have a mastectomy.  Pt is requesting to see a new surgeon.   CCS will not approve a switch once a pt is established with a Psychologist, sport and exercise.    She is requesting then -- to go to [facility].    Past Medical History  Diagnosis Date  . Hypertension   . Hyperlipidemia   . Bursitis of right shoulder   . Cataract   . Skin cancer 2000    melanoma and basal cell  . Breast cancer ([place]) 1115/16    left   . Allergy   . Vaso vagal episode     during preparation for colonoscopy  . GERD (gastroesophageal reflux disease)   . Fracture of right wrist   . Ankle fracture, left   . Family history of cancer     Past Surgical History  Procedure Laterality Date  . Appendectomy    . Ganglion cyst excision      L  hand  . Breast lumpectomy      B/L--FCS  . Lipiflow procedure    . Cataract extraction  2015  . Abdominal hysterectomy  1988    TAH/BSO--FIBROIDS  . Fiberadenoma Bilateral 1978, 1980  . Colonoscopy  2010  . Dg  bone density ([facility] hx)    . Eye surgery Bilateral     cataracts  . Breast lumpectomy with radioactive seed and sentinel lymph node biopsy Left 06/11/2015    Procedure: LEFT BREAST LUMPECTOMY WITH RADIOACTIVE SEED AND LEFT SENTINEL LYMPH NODE MAPPING;  Surgeon: Erroll Luna, MD;  Location: [facility];  Service: General;  Laterality: Left;    Family History  Problem Relation Age of Onset  . Lung cancer    .  Thrombosis      thromboembolism clotting disorder--? father died of ? clot   . Cancer Mother     mets to bone--? primary  . Coronary artery disease Brother     died of M! @ 71  . Diabetes Brother   . Hyperlipidemia Brother   . Hypertension Brother   . Heart disease Brother     chf  . Heart disease Father 27    MI  . Lung cancer Sister 30    former smoker  . Diabetes Maternal Aunt     Social History   Social History  . Marital Status: Widowed    Spouse Name: N/A  . Number of Children: N/A  . Years of Education: N/A   Occupational History  . teacher     retired   Social History Main Topics  . Smoking status: Never Smoker   . Smokeless tobacco: Never Used  . Alcohol Use: No  . Drug Use: No  . Sexual Activity: No   Other Topics Concern  . Not on file   Social History Narrative    Outpatient Prescriptions Prior to Visit  Medication Sig Dispense Refill  . alendronate (FOSAMAX) 70 MG tablet TAKE 1 TABLET (70 MG TOTAL) BY MOUTH EVERY SEVEN DAYS. TAKE WITH A FULL GLASS OF WATER AT NOON TIME ON EMPTY STOMACH. 4 tablet 11  . apixaban (ELIQUIS) 5 MG TABS tablet Take 1 tablet (5 mg total) by mouth 2 (two) times daily. 60 tablet 3  . Biotin (BIOTIN 5000) 5 MG CAPS Take 5 mg by mouth daily.     . Calcium Carbonate-Vitamin D (CALTRATE 600+D) 600-400 MG-UNIT per tablet Take 1 tablet by mouth daily.     . Cholecalciferol (VITAMIN D3) 1000 UNITS CAPS Take 1 capsule by mouth daily.      [name] diltiazem (CARDIZEM CD) 180 MG 24 hr capsule Take 1 capsule (180 mg total) by mouth daily. 30 capsule 3  . flecainide (TAMBOCOR) 50 MG tablet Take 50 mg by mouth 2 (two) times daily.    . Multiple Vitamins-Minerals (MULTIVITAMIN ADULT PO) Take 1 tablet by mouth 2 (two) times daily.     . Omega-3 Fatty Acids (FISH OIL PO) Take 1 tablet by mouth daily.     [name] omeprazole (PRILOSEC) 20 MG capsule TAKE 1 CAPSULE (20 MG TOTAL) BY MOUTH DAILY. 30 capsule 11  . pravastatin (PRAVACHOL) 40 MG tablet Take 1  tablet (40 mg total) by mouth daily. Repeat labs are due now 90 tablet 0  . Propylene Glycol 0.6 % SOLN Apply 1 drop to eye daily as needed (dry eyes).     . Psyllium (METAMUCIL PO) Take 15 mLs by mouth daily.     . RESTASIS 0.05 % ophthalmic emulsion Place 1 drop into both eyes 2 (two) times daily.    . Turmeric 500 MG CAPS Take 1 capsule by mouth daily.     [name] lidocaine-prilocaine (EMLA) cream Apply to affected area once (Patient not taking: Reported on 11/06/2015) 30 g 3  . ondansetron (ZOFRAN) 8 MG tablet Take 8 mg by mouth every 8 (eight) hours as needed for nausea. Reported on 11/06/2015     Facility-Administered Medications Prior to Visit  Medication Dose Route Frequency Provider Last Rate Last Dose  . sodium chloride 0.9 % injection 10 mL  10 mL Intracatheter PRN Nicholas Lose, MD   10 mL at 09/21/15 1717    Allergies  Allergen Reactions  . Sulfa Antibiotics Other (See Comments)    As a child, rigid as a stick and not responsive  . Tape Other (See Comments)    Blisters, Please use "paper" tape only for short periods of time  . Glucosamine Forte [Nutritional Supplements] Rash  . Latex Rash    ROS     Objective:    Physical Exam  Constitutional: She appears well-developed and well-nourished.  Psychiatric: She has a normal mood and affect. Her behavior is normal. Judgment and thought content normal.  Nursing note and vitals reviewed.   BP 134/85 mmHg  Pulse 72  Temp(Src) 98.2 F (36.8 C) (Oral)  Ht 5' 0.5" (1.537 m)  Wt 148 lb 3.2 oz (67.223 kg)  BMI 28.46 kg/m2  SpO2 99% Wt Readings from Last 3 Encounters:  11/06/15 148 lb 3.2 oz (67.223 kg)  10/21/15 145 lb (65.772 kg)  10/14/15 150 lb 14.4 oz (68.448 kg)     Lab Results  Component Value Date   WBC 8.0 10/21/2015   HGB 12.7 10/21/2015   HCT 37.9 10/21/2015   PLT 294 10/21/2015   GLUCOSE 101 09/21/2015   CHOL 140 05/15/2015   TRIG 81.0  05/15/2015   HDL 59.10 05/15/2015   LDLDIRECT 153.4 11/16/2009    LDLCALC 65 05/15/2015   ALT 17 09/21/2015   AST 19 09/21/2015   NA 140 09/21/2015   K 4.8 09/21/2015   CL 107 07/15/2015   CREATININE 0.8 09/21/2015   BUN 9.5 09/21/2015   CO2 30* 09/21/2015   TSH 0.62 03/09/2011   INR 1.20 10/21/2015    Lab Results  Component Value Date   TSH 0.62 03/09/2011   Lab Results  Component Value Date   WBC 8.0 10/21/2015   HGB 12.7 10/21/2015   HCT 37.9 10/21/2015   MCV 86.1 10/21/2015   PLT 294 10/21/2015   Lab Results  Component Value Date   NA 140 09/21/2015   K 4.8 09/21/2015   CHLORIDE 104 09/21/2015   CO2 30* 09/21/2015   GLUCOSE 101 09/21/2015   BUN 9.5 09/21/2015   CREATININE 0.8 09/21/2015   BILITOT 0.31 09/21/2015   ALKPHOS 76 09/21/2015   AST 19 09/21/2015   ALT 17 09/21/2015   PROT 6.7 09/21/2015   ALBUMIN 3.3* 09/21/2015   CALCIUM 10.3 09/21/2015   ANIONGAP 7 09/21/2015   EGFR 67* 09/21/2015   GFR 69.88 05/15/2015   Lab Results  Component Value Date   CHOL 140 05/15/2015   Lab Results  Component Value Date   HDL 59.10 05/15/2015   Lab Results  Component Value Date   LDLCALC 65 05/15/2015   Lab Results  Component Value Date   TRIG 81.0 05/15/2015   Lab Results  Component Value Date   CHOLHDL 2 05/15/2015   No results found for: HGBA1C     Assessment & Plan:   Problem List Items Addressed This Visit    None    Visit Diagnoses    Malignant neoplasm of left female breast, unspecified site of breast ([place])    -  Primary    Relevant Medications    loratadine (CLARITIN) 10 MG tablet    Other Relevant Orders    Ambulatory referral to General Surgery     pt requesting to go to [facility]-- she will call her oncologist to let them know as well   I have discontinued Ms. Shaddock's lidocaine-prilocaine and ondansetron. I am also having her maintain her Calcium Carbonate-Vitamin D, Vitamin D3, Omega-3 Fatty Acids (FISH OIL PO), Biotin, Turmeric, RESTASIS, Propylene Glycol, omeprazole, Multiple Vitamins-Minerals  (MULTIVITAMIN ADULT PO), alendronate, Psyllium (METAMUCIL PO), apixaban, diltiazem, flecainide, pravastatin, loratadine, cycloSPORINE, and flecainide.  Meds ordered this encounter  Medications  . loratadine (CLARITIN) 10 MG tablet    Sig: Take 10 mg by mouth daily.  . cycloSPORINE (RESTASIS) 0.05 % ophthalmic emulsion    Sig: Apply 1 drop to eye 2 (two) times daily.  . flecainide (TAMBOCOR) 100 MG tablet    Sig: Take 0.5 tablets by mouth 2 (two) times daily.     Ann Held, DO

## 2015-11-09 ENCOUNTER — Ambulatory Visit: Payer: Medicare PPO | Admitting: Radiation Oncology

## 2015-11-10 ENCOUNTER — Ambulatory Visit: Payer: Medicare PPO

## 2015-11-10 DIAGNOSIS — C50512 Malignant neoplasm of lower-outer quadrant of left female breast: Secondary | ICD-10-CM | POA: Diagnosis not present

## 2015-11-11 ENCOUNTER — Telehealth: Payer: Self-pay | Admitting: *Deleted

## 2015-11-11 ENCOUNTER — Ambulatory Visit: Payer: Medicare PPO

## 2015-11-11 NOTE — Telephone Encounter (Signed)
Received call from patient that the lumpectomy site wound is draining again and she met with the surgeon who suggested she might need a mastectomy.  She states she was not happy with that decision and since sought second opinion at [facility].  They have suggested she get another mammogam and then she will see the surgeon at [facility] again on 5/23.  She wanted Dr. Lindi Adie to be aware of what was going on.  Informed her i would let him know and to let us know what she decides.

## 2015-11-12 ENCOUNTER — Ambulatory Visit: Payer: Medicare PPO

## 2015-11-12 ENCOUNTER — Telehealth: Payer: Self-pay | Admitting: Family Medicine

## 2015-11-12 DIAGNOSIS — C50812 Malignant neoplasm of overlapping sites of left female breast: Secondary | ICD-10-CM | POA: Diagnosis not present

## 2015-11-12 NOTE — Telephone Encounter (Signed)
i sent her to [facility] because she was unhappy with oncologist here---i did not see wound because of bandage She should f/u [facility] advice

## 2015-11-12 NOTE — Telephone Encounter (Signed)
Spoke with pt and she states that she has an appointment tomorrow morning with [facility] and she has a follow up with Dr. Genia Hotter, next Tuesday. She voices understanding and she will wait for a call from Dr. Genia Hotter to see where to go from here about the drainage.

## 2015-11-12 NOTE — Telephone Encounter (Signed)
Can be reached: [phone]  Reason for call: pt has question about an incision from lumpectomy that is not healing, going on 6 months. She would like to ask some questions. Pt will be home until 10:30 then back after 12:30.   Pt returned call 12:41pm

## 2015-11-12 NOTE — Telephone Encounter (Signed)
Spoke with pt and she is concerned that the incision is not healing like it should be. She wants to know if there is any other options as far as medication or a clinic that she could go to get some help with to speed up the healing process from the incision and she is concerned about some blood colored drainage. She states that she has an appointment for an ultrasound tomorrow morning to check on the status of the lumpectomy. Please advise.  HM

## 2015-11-12 NOTE — Telephone Encounter (Signed)
Pt last seen 11/06/15. Did you see wound at that visit or would you like her to schedule OV?   Seen by Dr. Natividad Brood at [facility] 11/10/15. Below is from his OV note.   "Left breast with non healing lumpectomy incision draining dark brownish fluid, mild erythema, tender to palpation."  "ASSESSMENT: Jessica Mullins is a 82 y.o. female who presents for the first time today to the [facility] for an assessment of her non healing lumpectomy site. I discussed with her that chronic seroma can occur after lumpectomy. It is unusual as she has not received radiation therapy. I also discussed with her that non healing wound can occur when there are residual cancer. I have offered to perform biopsy today in the office, however, she has declined this offer as she would like surgical treatment for this lesion regardless of presence of residual cancer or not. After long discussed, decision was made not to proceed with biopsy at this time. I did recommend mammogram to see if imaging would reveal any residual disease. Discussed surgical option of repeat lumpectomy vs. Mastectomy. She is leaning more toward mastectomy. I did discuss with her that she may still have wound complications from mastectomy as well and that mastectomy is a bigger operation with slightly higher complications compared to lumpectomy."

## 2015-11-12 NOTE — Telephone Encounter (Signed)
Left message for pt to call back about the note below.  

## 2015-11-12 NOTE — Telephone Encounter (Signed)
Can be reached: [phone]   Reason for call: pt has question about an incision from lumpectomy that is not healing, going on 6 months. She would like to ask some questions. Pt will be home until 10:30 then back after 12:30.

## 2015-11-13 ENCOUNTER — Ambulatory Visit: Payer: Medicare PPO

## 2015-11-13 DIAGNOSIS — N63 Unspecified lump in breast: Secondary | ICD-10-CM | POA: Diagnosis not present

## 2015-11-13 DIAGNOSIS — Z853 Personal history of malignant neoplasm of breast: Secondary | ICD-10-CM | POA: Diagnosis not present

## 2015-11-14 ENCOUNTER — Other Ambulatory Visit (HOSPITAL_COMMUNITY): Payer: Self-pay | Admitting: Internal Medicine

## 2015-11-16 ENCOUNTER — Ambulatory Visit: Payer: Medicare PPO

## 2015-11-17 ENCOUNTER — Ambulatory Visit: Payer: Medicare PPO

## 2015-11-17 DIAGNOSIS — T8189XD Other complications of procedures, not elsewhere classified, subsequent encounter: Secondary | ICD-10-CM | POA: Diagnosis not present

## 2015-11-18 ENCOUNTER — Ambulatory Visit: Payer: Medicare PPO

## 2015-11-19 ENCOUNTER — Ambulatory Visit (HOSPITAL_BASED_OUTPATIENT_CLINIC_OR_DEPARTMENT_OTHER): Payer: Medicare PPO | Admitting: Hematology and Oncology

## 2015-11-19 ENCOUNTER — Encounter: Payer: Self-pay | Admitting: Hematology and Oncology

## 2015-11-19 ENCOUNTER — Ambulatory Visit: Payer: Medicare PPO

## 2015-11-19 VITALS — BP 149/79 | HR 71 | Temp 97.9°F | Resp 18 | Wt 147.8 lb

## 2015-11-19 DIAGNOSIS — C50112 Malignant neoplasm of central portion of left female breast: Secondary | ICD-10-CM | POA: Diagnosis not present

## 2015-11-19 NOTE — Progress Notes (Signed)
Patient Care Team: Ann Held, DO as PCP - General Nicholas Lose, MD as Consulting Physician (Hematology and Oncology) Erroll Luna, MD as Consulting Physician (General Surgery)  SUMMARY OF ONCOLOGIC HISTORY:   Cancer of central portion of left female breast ([place])   05/04/2015 Mammogram  left breast distortion , breast density category A , 7 mm by ultrasound at 3:00 position middle depth   05/12/2015 Initial Diagnosis  left breast biopsy: invasive ductal cancer with DCIS , ER 0%, PR 0%, Ki-67 5%, HER-2 negative ratio 0.83   06/11/2015 Surgery Left lumpectomy: DCIS, left additional margin: IDC 1.3 cm + DCIS 1/4 LN positive, grade 2, margins negative, LVI present, ER 0%, PR 0%, HER-2 negative ratio 0.60, Ki-67 5%, T1cN1a stage II a, Mammaprint high risk luminal B   07/20/2015 - 09/21/2015 Chemotherapy Taxotere and Cytoxan adjuvant chemotherapy  4 cycles    CHIEF COMPLIANT: Left breast seroma, nonhealing  INTERVAL HISTORY: Jessica Mullins is a 82 year old with above-mentioned history of triple negative left breast cancer who underwent lumpectomy followed by adjuvant chemotherapy with Taxotere and Cytoxan. Lately she's been suffering with large seroma that was drained twice. It has not been healing and she is very frustrated about it. She had seen Dr. Brantley Stage who basically recommended that she has to probably undergo a mastectomy. She has sought a second opinion from [facility] who are pursuing investigations including  breast MRI for further assessment.  REVIEW OF SYSTEMS:   Constitutional: Denies fevers, chills or abnormal weight loss Eyes: Denies blurriness of vision Ears, nose, mouth, throat, and face: Denies mucositis or sore throat Respiratory: Denies cough, dyspnea or wheezes Cardiovascular: Denies palpitation, chest discomfort Gastrointestinal:  Denies nausea, heartburn or change in bowel habits Skin: Denies abnormal skin rashes Lymphatics: Denies new lymphadenopathy or  easy bruising Neurological:Denies numbness, tingling or new weaknesses Behavioral/Psych: Mood is stable, no new changes  Extremities: No lower extremity edema Breast: Nonhealing left breast wooud that is intermittently draining  All other systems were reviewed with the patient and are negative.  I have reviewed the past medical history, past surgical history, social history and family history with the patient and they are unchanged from previous note.  ALLERGIES:  is allergic to sulfa antibiotics; tape; glucosamine forte; and latex.  MEDICATIONS:  Current Outpatient Prescriptions  Medication Sig Dispense Refill  . alendronate (FOSAMAX) 70 MG tablet TAKE 1 TABLET (70 MG TOTAL) BY MOUTH EVERY SEVEN DAYS. TAKE WITH A FULL GLASS OF WATER AT NOON TIME ON EMPTY STOMACH. 4 tablet 11  . apixaban (ELIQUIS) 5 MG TABS tablet Take 1 tablet (5 mg total) by mouth 2 (two) times daily. 60 tablet 3  . Biotin (BIOTIN 5000) 5 MG CAPS Take 5 mg by mouth daily.     . Calcium Carbonate-Vitamin D (CALTRATE 600+D) 600-400 MG-UNIT per tablet Take 1 tablet by mouth daily.     . Cholecalciferol (VITAMIN D3) 1000 UNITS CAPS Take 1 capsule by mouth daily.      . cycloSPORINE (RESTASIS) 0.05 % ophthalmic emulsion Apply 1 drop to eye 2 (two) times daily.    [name] diltiazem (CARDIZEM CD) 180 MG 24 hr capsule TAKE 1 CAPSULE (180 MG TOTAL) BY MOUTH DAILY. 30 capsule 3  . flecainide (TAMBOCOR) 100 MG tablet Take 0.5 tablets by mouth 2 (two) times daily.    . flecainide (TAMBOCOR) 50 MG tablet Take 50 mg by mouth 2 (two) times daily.    [name] loratadine (CLARITIN) 10 MG tablet Take 10  mg by mouth daily.    . Multiple Vitamins-Minerals (MULTIVITAMIN ADULT PO) Take 1 tablet by mouth 2 (two) times daily.     . Omega-3 Fatty Acids (FISH OIL PO) Take 1 tablet by mouth daily.     [name] omeprazole (PRILOSEC) 20 MG capsule TAKE 1 CAPSULE (20 MG TOTAL) BY MOUTH DAILY. 30 capsule 11  . pravastatin (PRAVACHOL) 40 MG tablet Take 1 tablet (40 mg  total) by mouth daily. Repeat labs are due now 90 tablet 0  . Propylene Glycol 0.6 % SOLN Apply 1 drop to eye daily as needed (dry eyes).     . Psyllium (METAMUCIL PO) Take 15 mLs by mouth daily.     . RESTASIS 0.05 % ophthalmic emulsion Place 1 drop into both eyes 2 (two) times daily.    . Turmeric 500 MG CAPS Take 1 capsule by mouth daily.      No current facility-administered medications for this visit.   Facility-Administered Medications Ordered in Other Visits  Medication Dose Route Frequency Provider Last Rate Last Dose  . sodium chloride 0.9 % injection 10 mL  10 mL Intracatheter PRN Nicholas Lose, MD   10 mL at 09/21/15 1717    PHYSICAL EXAMINATION: ECOG PERFORMANCE STATUS: 1 - Symptomatic but completely ambulatory  Filed Vitals:   11/19/15 1118  BP: 149/79  Pulse: 71  Temp: 97.9 F (36.6 C)  Resp: 18   Filed Weights   11/19/15 1118  Weight: 147 lb 12.8 oz (67.042 kg)    GENERAL:alert, no distress and comfortable SKIN: skin color, texture, turgor are normal, no rashes or significant lesions EYES: normal, Conjunctiva are pink and non-injected, sclera clear OROPHARYNX:no exudate, no erythema and lips, buccal mucosa, and tongue normal  NECK: supple, thyroid normal size, non-tender, without nodularity LYMPH:  no palpable lymphadenopathy in the cervical, axillary or inguinal LUNGS: clear to auscultation and percussion with normal breathing effort HEART: regular rate & rhythm and no murmurs and no lower extremity edema ABDOMEN:abdomen soft, non-tender and normal bowel sounds MUSCULOSKELETAL:no cyanosis of digits and no clubbing  NEURO: alert & oriented x 3 with fluent speech, no focal motor/sensory deficits EXTREMITIES: No lower extremity edema BREAST:Left breast lower outer quadrant open wound measuring 2 cm in size with healthy granulation tissue no evidence of any discharge currently the breast is notr tense. There is no redness around the area.. (exam performed in the  presence of a chaperone)  LABORATORY DATA:  I have reviewed the data as listed   Chemistry      Component Value Date/Time   NA 140 09/21/2015 1318   NA 143 07/15/2015 1220   K 4.8 09/21/2015 1318   K 4.4 07/15/2015 1220   CL 107 07/15/2015 1220   CO2 30* 09/21/2015 1318   CO2 26 07/15/2015 1220   BUN 9.5 09/21/2015 1318   BUN 15 07/15/2015 1220   CREATININE 0.8 09/21/2015 1318   CREATININE 0.67 07/15/2015 1220      Component Value Date/Time   CALCIUM 10.3 09/21/2015 1318   CALCIUM 9.7 07/15/2015 1220   ALKPHOS 76 09/21/2015 1318   ALKPHOS 57 06/10/2015 0944   AST 19 09/21/2015 1318   AST 23 06/10/2015 0944   ALT 17 09/21/2015 1318   ALT 23 06/10/2015 0944   BILITOT 0.31 09/21/2015 1318   BILITOT 0.7 06/10/2015 0944       Lab Results  Component Value Date   WBC 8.0 10/21/2015   HGB 12.7 10/21/2015   HCT 37.9 10/21/2015  MCV 86.1 10/21/2015   PLT 294 10/21/2015   NEUTROABS 5.9 10/21/2015     ASSESSMENT & PLAN:  Cancer of central portion of left female breast ([place]) Left lumpectomy 06/11/2015: DCIS, left additional margin: IDC 1.3 cm + DCIS 1/4 LN positive, grade 2, margins negative, LVI present, ER 0%, PR 0%, HER-2 negative ratio 0.60, Ki-67 5%, T1cN1a stage II a , Mammaprint high risk ( 5 year risk 22%,10 year risk 29%) luminal type B, Distant metastasis free survival at 5 years 88% with chemotherapy. Adjuvant chemotherapy completed 09/21/2015 Taxotere and Cytoxan 4 cycles  Unable to start adjuvant radiation because of nonhealing wound issues in the breast Patient got a second opinion with [facility] breast surgery. They are contemplating obtaining a breast MRI for further evaluation to see if there is any local recurrence of breast cancer.  Breast wound: It has not draining currently. In the lower outer quadrant that is an open wound with healthy granulation tissue.  If there is no evidence of local recurrence, and I will recommend sending her to  wound care. Dr. Brantley Stage was recommending mastectomy. Patient probably will not be eligible to receive adjuvant radiation because of nonhealing wound.  Return to clinic in 3 months    No orders of the defined types were placed in this encounter.   The patient has a good understanding of the overall plan. she agrees with it. she will call with any problems that may develop before the next visit here.   Rulon Eisenmenger, MD 11/19/2015

## 2015-11-19 NOTE — Assessment & Plan Note (Signed)
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  Adjuvant chemotherapy completed 09/21/2015 Taxotere and Cytoxan 4 cycles Unable to start adjuvant radiation because of nonhealing wound issues in the breast Patient is contemplating obtaining a second opinion regarding her breast surgical options. Dr. Brantley Stage is recommending mastectomy.  Return to clinic in 6 months

## 2015-11-20 ENCOUNTER — Ambulatory Visit: Payer: Medicare PPO

## 2015-11-21 ENCOUNTER — Ambulatory Visit: Payer: Medicare PPO

## 2015-11-24 ENCOUNTER — Ambulatory Visit: Payer: Medicare PPO

## 2015-11-25 ENCOUNTER — Ambulatory Visit: Payer: Medicare PPO

## 2015-11-25 ENCOUNTER — Ambulatory Visit (HOSPITAL_COMMUNITY)
Admission: RE | Admit: 2015-11-25 | Discharge: 2015-11-25 | Disposition: A | Discharge status: Home / Self Care | Payer: Medicare PPO | Source: Ambulatory Visit | Attending: Internal Medicine | Admitting: Internal Medicine

## 2015-11-25 VITALS — BP 138/76 | HR 70 | Wt 146.8 lb

## 2015-11-25 DIAGNOSIS — Z801 Family history of malignant neoplasm of trachea, bronchus and lung: Secondary | ICD-10-CM | POA: Insufficient documentation

## 2015-11-25 DIAGNOSIS — Z91040 Latex allergy status: Secondary | ICD-10-CM | POA: Diagnosis not present

## 2015-11-25 DIAGNOSIS — K219 Gastro-esophageal reflux disease without esophagitis: Secondary | ICD-10-CM | POA: Diagnosis not present

## 2015-11-25 DIAGNOSIS — Z8249 Family history of ischemic heart disease and other diseases of the circulatory system: Secondary | ICD-10-CM | POA: Diagnosis not present

## 2015-11-25 DIAGNOSIS — Z85820 Personal history of malignant melanoma of skin: Secondary | ICD-10-CM | POA: Diagnosis not present

## 2015-11-25 DIAGNOSIS — C50912 Malignant neoplasm of unspecified site of left female breast: Secondary | ICD-10-CM | POA: Insufficient documentation

## 2015-11-25 DIAGNOSIS — Z7983 Long term (current) use of bisphosphonates: Secondary | ICD-10-CM | POA: Diagnosis not present

## 2015-11-25 DIAGNOSIS — Z85828 Personal history of other malignant neoplasm of skin: Secondary | ICD-10-CM | POA: Insufficient documentation

## 2015-11-25 DIAGNOSIS — E785 Hyperlipidemia, unspecified: Secondary | ICD-10-CM | POA: Insufficient documentation

## 2015-11-25 DIAGNOSIS — Z833 Family history of diabetes mellitus: Secondary | ICD-10-CM | POA: Diagnosis not present

## 2015-11-25 DIAGNOSIS — I10 Essential (primary) hypertension: Secondary | ICD-10-CM | POA: Insufficient documentation

## 2015-11-25 DIAGNOSIS — Z7901 Long term (current) use of anticoagulants: Secondary | ICD-10-CM | POA: Insufficient documentation

## 2015-11-25 DIAGNOSIS — I4891 Unspecified atrial fibrillation: Secondary | ICD-10-CM

## 2015-11-25 DIAGNOSIS — Z79899 Other long term (current) drug therapy: Secondary | ICD-10-CM | POA: Insufficient documentation

## 2015-11-25 DIAGNOSIS — L905 Scar conditions and fibrosis of skin: Secondary | ICD-10-CM | POA: Insufficient documentation

## 2015-11-25 DIAGNOSIS — Z882 Allergy status to sulfonamides status: Secondary | ICD-10-CM | POA: Diagnosis not present

## 2015-11-25 DIAGNOSIS — I480 Paroxysmal atrial fibrillation: Secondary | ICD-10-CM | POA: Diagnosis not present

## 2015-11-25 NOTE — Patient Instructions (Signed)
We will contact you in 6 months to schedule your next appointment.  

## 2015-11-25 NOTE — Progress Notes (Addendum)
Patient ID: TRUE Jessica Mullins, female   DOB: May 06, 1931, 83 y.o.   MRN: [id number] Patient ID: Jessica Mullins, female   DOB: 1930/10/17, 82 y.o.   MRN: [id number]  CARDIOLOGY CLINIC  NOTE  Referring Physician: Lindi Adie Primary Care: Etter Sjogren Primary Cardiologist: None  HPI:  Jessica Mullins is a an 82 y.o woman with h/o HTN, HL, PAF and left breast CA.   Diagnosed with breast CA in 11/17. Underwent lumpectomy in 12/16. Had 2/4 rounds of chemo so far with taxotere and Cytoxan last dose Monday.  Last month found to have AF with RVR. Started on Eliquis. Echo normal.   We arranged for TEE/DC-CV on 08/14/15 but when she arrived for procedure she was in NSR. Given high risk of recurrence we started flecainide 50 bid. She had treadmill testto exclude proarrhythmia. Baseline ECG with NSR 73. QRS and PR intervlas normal. No arrhythmias. Nonspecific ST changes with exercise.   Returns for f/u: Doing well. No palpitations or bleeding. Maintaining NSR on flecainide 50 bid. Struggling with non-healing mastectomy wound. Has MRI scheduled at [facility]     Past Medical History  Diagnosis Date  . Hypertension   . Hyperlipidemia   . Bursitis of right shoulder   . Cataract   . Skin cancer 2000    melanoma and basal cell  . Breast cancer ([place]) 1115/16    left   . Allergy   . Vaso vagal episode     during preparation for colonoscopy  . GERD (gastroesophageal reflux disease)   . Fracture of right wrist   . Ankle fracture, left   . Family history of cancer     Current Outpatient Prescriptions  Medication Sig Dispense Refill  . alendronate (FOSAMAX) 70 MG tablet TAKE 1 TABLET (70 MG TOTAL) BY MOUTH EVERY SEVEN DAYS. TAKE WITH A FULL GLASS OF WATER AT NOON TIME ON EMPTY STOMACH. 4 tablet 11  . apixaban (ELIQUIS) 5 MG TABS tablet Take 1 tablet (5 mg total) by mouth 2 (two) times daily. 60 tablet 3  . Biotin (BIOTIN 5000) 5 MG CAPS Take 5 mg by mouth daily.     . Calcium Carbonate-Vitamin D (CALTRATE 600+D) 600-400  MG-UNIT per tablet Take 1 tablet by mouth daily.     . Cholecalciferol (VITAMIN D3) 1000 UNITS CAPS Take 1 capsule by mouth daily.      . cycloSPORINE (RESTASIS) 0.05 % ophthalmic emulsion Apply 1 drop to eye 2 (two) times daily.    [name] diltiazem (CARDIZEM CD) 180 MG 24 hr capsule TAKE 1 CAPSULE (180 MG TOTAL) BY MOUTH DAILY. 30 capsule 3  . flecainide (TAMBOCOR) 50 MG tablet Take 50 mg by mouth 2 (two) times daily.    [name] loratadine (CLARITIN) 10 MG tablet Take 10 mg by mouth daily.    . Multiple Vitamins-Minerals (MULTIVITAMIN ADULT PO) Take 1 tablet by mouth 2 (two) times daily.     . Omega-3 Fatty Acids (FISH OIL PO) Take 1 tablet by mouth daily.     [name] omeprazole (PRILOSEC) 20 MG capsule TAKE 1 CAPSULE (20 MG TOTAL) BY MOUTH DAILY. 30 capsule 11  . pravastatin (PRAVACHOL) 40 MG tablet Take 1 tablet (40 mg total) by mouth daily. Repeat labs are due now 90 tablet 0  . Propylene Glycol 0.6 % SOLN Apply 1 drop to eye daily as needed (dry eyes).     . Psyllium (METAMUCIL PO) Take 15 mLs by mouth daily.     . RESTASIS 0.05 % ophthalmic emulsion Place 1  drop into both eyes 2 (two) times daily.    . Turmeric 500 MG CAPS Take 1 capsule by mouth daily.      No current facility-administered medications for this encounter.   Facility-Administered Medications Ordered in Other Encounters  Medication Dose Route Frequency Provider Last Rate Last Dose  . sodium chloride 0.9 % injection 10 mL  10 mL Intracatheter PRN Nicholas Lose, MD   10 mL at 09/21/15 1717    Allergies  Allergen Reactions  . Sulfa Antibiotics Other (See Comments)    As a child, rigid as a stick and not responsive  . Tape Other (See Comments)    Blisters, Please use "paper" tape only for short periods of time  . Glucosamine Forte [Nutritional Supplements] Rash  . Latex Rash      Social History   Social History  . Marital Status: Widowed    Spouse Name: N/A  . Number of Children: N/A  . Years of Education: N/A   Occupational  History  . teacher     retired   Social History Main Topics  . Smoking status: Never Smoker   . Smokeless tobacco: Never Used  . Alcohol Use: No  . Drug Use: No  . Sexual Activity: No   Other Topics Concern  . Not on file   Social History Narrative      Family History  Problem Relation Age of Onset  . Lung cancer    . Thrombosis      thromboembolism clotting disorder--? father died of ? clot   . Cancer Mother     mets to bone--? primary  . Coronary artery disease Brother     died of M! @ 86  . Diabetes Brother   . Hyperlipidemia Brother   . Hypertension Brother   . Heart disease Brother     chf  . Heart disease Father 73    MI  . Lung cancer Sister 36    former smoker  . Diabetes Maternal Aunt     Filed Vitals:   11/25/15 1217  BP: 138/76  Pulse: 70  Weight: 146 lb 12 oz (66.565 kg)  SpO2: 99%    PHYSICAL EXAM: General:  Elderly.  NAD HEENT: normal Neck: supple. no JVD. Carotids 2+ bilat; no bruits. No lymphadenopathy or thryomegaly appreciated. Cor: PMI nondisplaced. RRR  No rubs, gallops or murmurs. Lungs: clear Abdomen: soft, nontender, nondistended. No hepatosplenomegaly. No bruits or masses. Good bowel sounds. Extremities: no cyanosis, clubbing, rash, edema Neuro: alert & oriented x 3, cranial nerves grossly intact. moves all 4 extremities w/o difficulty. Affect pleasant   ASSESSMENT & PLAN: 1. PAF with RVR     --CHADSVASC 4 (age (4), HTN, female)      --echo normal     --maintaining NSR on flecainide. Intervals normal. No arrhythmia on stress test. Continue flecainide, diltiazem and Eliquis 2. Breast CA with non-healing mastectomy scar  Bensimhon, Daniel,MD 1:01 PM

## 2015-11-26 ENCOUNTER — Ambulatory Visit: Payer: Medicare PPO

## 2015-11-27 ENCOUNTER — Ambulatory Visit: Payer: Medicare PPO

## 2015-11-28 ENCOUNTER — Ambulatory Visit: Payer: Medicare PPO

## 2015-11-30 ENCOUNTER — Ambulatory Visit: Payer: Medicare PPO

## 2015-12-01 ENCOUNTER — Ambulatory Visit: Payer: Medicare PPO

## 2015-12-02 ENCOUNTER — Ambulatory Visit: Payer: Medicare PPO

## 2015-12-03 ENCOUNTER — Ambulatory Visit: Payer: Medicare PPO

## 2015-12-04 ENCOUNTER — Ambulatory Visit: Payer: Medicare PPO

## 2015-12-04 DIAGNOSIS — C50512 Malignant neoplasm of lower-outer quadrant of left female breast: Secondary | ICD-10-CM | POA: Diagnosis not present

## 2015-12-05 ENCOUNTER — Ambulatory Visit: Payer: Medicare PPO

## 2015-12-07 ENCOUNTER — Ambulatory Visit: Payer: Medicare PPO

## 2015-12-08 ENCOUNTER — Ambulatory Visit: Payer: Medicare PPO

## 2015-12-09 ENCOUNTER — Ambulatory Visit: Payer: Medicare PPO

## 2015-12-09 ENCOUNTER — Other Ambulatory Visit: Payer: Self-pay

## 2015-12-09 DIAGNOSIS — I4819 Other persistent atrial fibrillation: Secondary | ICD-10-CM

## 2015-12-09 MED ORDER — APIXABAN 5 MG PO TABS: 5.0000 mg | ORAL_TABLET | Freq: Two times a day (BID) | ORAL | Status: DC

## 2015-12-10 ENCOUNTER — Ambulatory Visit: Payer: Medicare PPO

## 2015-12-11 ENCOUNTER — Ambulatory Visit: Payer: Medicare PPO

## 2015-12-14 ENCOUNTER — Other Ambulatory Visit: Payer: Self-pay | Admitting: *Deleted

## 2015-12-14 DIAGNOSIS — C50112 Malignant neoplasm of central portion of left female breast: Secondary | ICD-10-CM

## 2015-12-15 DIAGNOSIS — N63 Unspecified lump in breast: Secondary | ICD-10-CM | POA: Diagnosis not present

## 2015-12-15 DIAGNOSIS — Z853 Personal history of malignant neoplasm of breast: Secondary | ICD-10-CM | POA: Diagnosis not present

## 2015-12-22 ENCOUNTER — Other Ambulatory Visit: Payer: Self-pay | Admitting: *Deleted

## 2015-12-22 DIAGNOSIS — C50512 Malignant neoplasm of lower-outer quadrant of left female breast: Secondary | ICD-10-CM | POA: Diagnosis not present

## 2015-12-28 ENCOUNTER — Other Ambulatory Visit: Payer: Self-pay | Admitting: Family Medicine

## 2016-01-05 ENCOUNTER — Telehealth: Payer: Self-pay | Admitting: *Deleted

## 2016-01-05 NOTE — Progress Notes (Addendum)
Follow up Left Breast Cancer seen by Dr. Lindell Spar 09/2015, and had Ct simulation 10/27/15(Triple negative) Left breast seroma non healing  Seen by Dr. Brantley Stage ,MD;  Who recommended  Undergo a mastectomy, Patient went for 2nd opinion at [facility]   They are contemplating a breast MRI for further evaluation to see if there is any local recurrence breast cancer Adjuvant chemotherapy  Completed 08/24/2015 Taxotere and Cytoxan x 4 cycles last dose 09-21-15   Patient  Was at [facility] radiology for MRI Breast  12/04/15, and Surgeon office visit 12/08/15 Dr. Aviva Kluver  Skin: Left breast has not drained in about two months,color normal pink, has some edema and tenderness to touch. BP 137/73 mmHg  Pulse 62  Temp(Src) 98.1 F (36.7 C) (Oral)  Resp 16  Ht 5' 0.5" (1.537 m)  Wt 149 lb 11.2 oz (67.903 kg)  BMI 28.74 kg/m2  SpO2 98%

## 2016-01-05 NOTE — Telephone Encounter (Signed)
I spoke with the patsint, she verified her appt at 1000am tomorrow with [place] 3:57 PM

## 2016-01-06 ENCOUNTER — Ambulatory Visit
Admission: RE | Admit: 2016-01-06 | Discharge: 2016-01-06 | Disposition: A | Discharge status: Home / Self Care | Payer: Medicare PPO | Source: Ambulatory Visit | Attending: Radiation Oncology | Admitting: Radiation Oncology

## 2016-01-06 ENCOUNTER — Encounter: Payer: Self-pay | Admitting: Radiation Oncology

## 2016-01-06 VITALS — BP 137/73 | HR 62 | Temp 98.1°F | Resp 16 | Ht 60.5 in | Wt 149.7 lb

## 2016-01-06 DIAGNOSIS — C50112 Malignant neoplasm of central portion of left female breast: Secondary | ICD-10-CM | POA: Diagnosis not present

## 2016-01-06 NOTE — Progress Notes (Signed)
Radiation Oncology         [phone]) [phone] ________________________________  Name: Jessica Mullins MRN: [id number]  Date: 01/06/2016  DOB: Jul 24, 1930  Follow-Up Visit Note  CC: Ann Held, DO  Cornett, Thomas, MD  Diagnosis:   Stage I, T1b, N0 Triple negative Left invasive Ductal Carcinoma of the breast  Narrative:  The patient is a pleasant 82 y.o. female who returns today for follow-up.  She initially met with Dr. Pablo Ledger on 06/03/2015 in the [facility] clinic. She was counseled on lumpectomy and sentinel node assessment followed by radiotherapy and possible chemotherapy. She had her lumpectomy on 06/11/2015. This showed invasive and in situ ductal carcinoma measuring 1.3 cm. Margins were found to be negative. Closest margin inferior at 1.0 cm. Three lymph nodes were benign, though carcinoma was found in one lymph node. The patient completed Taxotere and Cytoxan x 4 cycles in on 08/24/15. Following completion of her chemotherapy, she met with Dr. Pablo Ledger, however she has difficulty with healing at the lumpectomy scar. She was offered mastectomy, though was interested in a second opinion at [facility] which she pursued. She has undergone MRI, ultrasound, and further assessment with Dr. Tori Milks. The recommendation at the end of this workup and after tumor board met, was for her to consider mastectomy. The patient is not interested in proceeding with any additional surgical intervention, though is interested if radiation is still an option to pursue this. She comes today to establish is supple Dr. Lisbeth Renshaw.   On review of systems, the patient reports that she is doing well overall. She denies any chest pain, shortness of breath, cough, fevers, chills, night sweats, unintended weight changes. She denies any bowel or bladder disturbances, and denies abdominal pain, nausea or vomiting. She denies any new musculoskeletal or joint aches or pains. A complete review of systems is obtained and is  otherwise negative.  Past Medical History:  Past Medical History  Diagnosis Date  . Hypertension   . Hyperlipidemia   . Bursitis of right shoulder   . Cataract   . Skin cancer 2000    melanoma and basal cell  . Breast cancer ([place]) 1115/16    left   . Allergy   . Vaso vagal episode     during preparation for colonoscopy  . GERD (gastroesophageal reflux disease)   . Fracture of right wrist   . Ankle fracture, left   . Family history of cancer     Past Surgical History: Past Surgical History  Procedure Laterality Date  . Appendectomy    . Ganglion cyst excision      L  hand  . Breast lumpectomy      B/L--FCS  . Lipiflow procedure    . Cataract extraction  2015  . Abdominal hysterectomy  1988    TAH/BSO--FIBROIDS  . Fiberadenoma Bilateral 1978, 1980  . Colonoscopy  2010  . Dg  bone density ([facility] hx)    . Eye surgery Bilateral     cataracts  . Breast lumpectomy with radioactive seed and sentinel lymph node biopsy Left 06/11/2015    Procedure: LEFT BREAST LUMPECTOMY WITH RADIOACTIVE SEED AND LEFT SENTINEL LYMPH NODE MAPPING;  Surgeon: Erroll Luna, MD;  Location: [facility];  Service: General;  Laterality: Left;    Social History:  Social History   Social History  . Marital Status: Widowed    Spouse Name: N/A  . Number of Children: N/A  . Years of Education: N/A   Occupational  History  . teacher     retired   Social History Main Topics  . Smoking status: Never Smoker   . Smokeless tobacco: Never Used  . Alcohol Use: No  . Drug Use: No  . Sexual Activity: No   Other Topics Concern  . Not on file   Social History Narrative  She is widowed and resides in [place]. She is originally from [place].   Family History: Family History  Problem Relation Age of Onset  . Lung cancer    . Thrombosis      thromboembolism clotting disorder--? father died of ? clot   . Cancer Mother     mets to bone--? primary  . Coronary artery disease Brother     died of M! @  62  . Diabetes Brother   . Hyperlipidemia Brother   . Hypertension Brother   . Heart disease Brother     chf  . Heart disease Father 53    MI  . Lung cancer Sister 73    former smoker  . Diabetes Maternal Aunt     ALLERGIES:  is allergic to sulfa antibiotics; tape; glucosamine forte; and latex.  Meds: Current Outpatient Prescriptions  Medication Sig Dispense Refill  . alendronate (FOSAMAX) 70 MG tablet TAKE 1 TABLET (70 MG TOTAL) BY MOUTH EVERY SEVEN DAYS. TAKE WITH A FULL GLASS OF WATER AT NOON TIME ON EMPTY STOMACH. 4 tablet 11  . apixaban (ELIQUIS) 5 MG TABS tablet Take 1 tablet (5 mg total) by mouth 2 (two) times daily. 60 tablet 3  . Biotin (BIOTIN 5000) 5 MG CAPS Take 5 mg by mouth daily.     . Calcium Carbonate-Vitamin D (CALTRATE 600+D) 600-400 MG-UNIT per tablet Take 1 tablet by mouth daily.     . Cholecalciferol (VITAMIN D3) 1000 UNITS CAPS Take 1 capsule by mouth daily.      . cycloSPORINE (RESTASIS) 0.05 % ophthalmic emulsion Apply 1 drop to eye 2 (two) times daily.    [name] diltiazem (CARDIZEM CD) 180 MG 24 hr capsule TAKE 1 CAPSULE (180 MG TOTAL) BY MOUTH DAILY. 30 capsule 3  . flecainide (TAMBOCOR) 50 MG tablet Take 50 mg by mouth 2 (two) times daily.    [name] loratadine (CLARITIN) 10 MG tablet Take 10 mg by mouth daily.    . Multiple Vitamins-Minerals (MULTIVITAMIN ADULT PO) Take 1 tablet by mouth 2 (two) times daily.     . Omega-3 Fatty Acids (FISH OIL PO) Take 1 tablet by mouth daily.     [name] omeprazole (PRILOSEC) 20 MG capsule TAKE 1 CAPSULE (20 MG TOTAL) BY MOUTH DAILY. 30 capsule 11  . pravastatin (PRAVACHOL) 40 MG tablet Take 1 tablet (40 mg total) by mouth daily. Repeat labs are due now 90 tablet 0  . Propylene Glycol 0.6 % SOLN Apply 1 drop to eye daily as needed (dry eyes).     . Psyllium (METAMUCIL PO) Take 15 mLs by mouth daily.     . RESTASIS 0.05 % ophthalmic emulsion Place 1 drop into both eyes 2 (two) times daily.    . Turmeric 500 MG CAPS Take 1 capsule by  mouth daily.      No current facility-administered medications for this encounter.   Facility-Administered Medications Ordered in Other Encounters  Medication Dose Route Frequency Provider Last Rate Last Dose  . sodium chloride 0.9 % injection 10 mL  10 mL Intracatheter PRN Nicholas Lose, MD   10 mL at 09/21/15 1717    Physical  Findings  height is 5' 0.5" (1.537 m) and weight is 149 lb 11.2 oz (67.903 kg). Her oral temperature is 98.1 F (36.7 C). Her blood pressure is 137/73 and her pulse is 62. Her respiration is 16 and oxygen saturation is 98%. .   In general this is a well appearing Caucasian female in no acute distress. She is alert and oriented x4 and appropriate throughout the examination. HEENT reveals that the patient is normocephalic, atraumatic. EOMs are intact. PERRLA. Skin is intact without any evidence of gross lesions. Cardiovascular exam reveals a regular rate and rhythm, no clicks rubs or murmurs are auscultated. Chest is clear to auscultation bilaterally. Lymphatic assessment is performed and does not reveal any adenopathy in the cervical, supraclavicular, axillary, or inguinal chains. Abdomen has active bowel sounds in all quadrants and is intact. The abdomen is soft, non tender, non distended. Lower extremities are negative for pretibial pitting edema, deep calf tenderness, cyanosis or clubbing.   Lab Findings: Lab Results  Component Value Date   WBC 8.0 10/21/2015   HGB 12.7 10/21/2015   HCT 37.9 10/21/2015   MCV 86.1 10/21/2015   PLT 294 10/21/2015     Radiographic Findings: No results found.  Impression/Plan: 1. Left invasive Ductal Carcinoma of the breast, ER negative PR negative. Dr. Lisbeth Renshaw discusses the findings from the patient's pathology, and reviews the most recent imaging at [facility] as well as he previous imaging prior to her diagnosis. He recommends proceeding with radiotherapy to the left breast with high tangents in 20 fractions over 4 weeks. We  discussed the risks, benefits, short, and long term effects of treatment and the patient wishes to move forward. She has signed written consent and will move forward with simulation in the very near future. Our department staff will contact her to coordinate this planning session.   The above documentation reflects my direct findings during this shared patient visit. Please see the separate note by Dr. Lisbeth Renshaw  on this date for the remainder of the patient's plan of care.    Carola Rhine, PAC   This document serves as a record of services personally performed by Kyung Rudd, MD. It was created on his behalf by Truddie Hidden, a trained medical scribe. The creation of this record is based on the scribe's personal observations and the provider's statements to them. This document has been checked and approved by the attending provider.

## 2016-01-13 ENCOUNTER — Ambulatory Visit
Admission: RE | Admit: 2016-01-13 | Discharge: 2016-01-13 | Disposition: A | Discharge status: Home / Self Care | Payer: Medicare PPO | Source: Ambulatory Visit | Attending: Radiation Oncology | Admitting: Radiation Oncology

## 2016-01-13 DIAGNOSIS — C50112 Malignant neoplasm of central portion of left female breast: Secondary | ICD-10-CM

## 2016-01-14 DIAGNOSIS — C50112 Malignant neoplasm of central portion of left female breast: Secondary | ICD-10-CM | POA: Diagnosis not present

## 2016-01-15 DIAGNOSIS — C50112 Malignant neoplasm of central portion of left female breast: Secondary | ICD-10-CM | POA: Diagnosis not present

## 2016-01-18 ENCOUNTER — Ambulatory Visit (HOSPITAL_BASED_OUTPATIENT_CLINIC_OR_DEPARTMENT_OTHER): Payer: Medicare PPO | Admitting: Hematology and Oncology

## 2016-01-18 ENCOUNTER — Encounter: Payer: Self-pay | Admitting: Hematology and Oncology

## 2016-01-18 ENCOUNTER — Ambulatory Visit: Payer: Medicare PPO | Admitting: Hematology and Oncology

## 2016-01-18 ENCOUNTER — Telehealth: Payer: Self-pay | Admitting: Hematology and Oncology

## 2016-01-18 DIAGNOSIS — Z85820 Personal history of malignant melanoma of skin: Secondary | ICD-10-CM

## 2016-01-18 DIAGNOSIS — C50112 Malignant neoplasm of central portion of left female breast: Secondary | ICD-10-CM | POA: Diagnosis not present

## 2016-01-18 DIAGNOSIS — Z171 Estrogen receptor negative status [ER-]: Secondary | ICD-10-CM | POA: Diagnosis not present

## 2016-01-18 DIAGNOSIS — C773 Secondary and unspecified malignant neoplasm of axilla and upper limb lymph nodes: Secondary | ICD-10-CM

## 2016-01-18 NOTE — Assessment & Plan Note (Signed)
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 Adjuvant chemotherapy completed 09/21/2015 Taxotere and Cytoxan 4 cycles Adjuvant radiation delayed due to wound healing issues (will be starting soon)   Return to clinic in 6 months for follow-up and surveillance.

## 2016-01-18 NOTE — Progress Notes (Signed)
Patient Care Team: Ann Held, DO as PCP - General Nicholas Lose, MD as Consulting Physician (Hematology and Oncology) Erroll Luna, MD as Consulting Physician (General Surgery)  DIAGNOSIS: No matching staging information was found for the patient.  SUMMARY OF ONCOLOGIC HISTORY:   Cancer of central portion of left female breast ([place])   05/04/2015 Mammogram     left breast distortion , breast density category A , 7 mm by ultrasound at 3:00 position middle depth     05/12/2015 Initial Diagnosis     left breast biopsy: invasive ductal cancer with DCIS , ER 0%, PR 0%, Ki-67 5%, HER-2 negative ratio 0.83     06/11/2015 Surgery    Left lumpectomy: DCIS, left additional margin: IDC 1.3 cm + DCIS 1/4 LN positive, grade 2, margins negative, LVI present, ER 0%, PR 0%, HER-2 negative ratio 0.60, Ki-67 5%, T1cN1a stage II a, Mammaprint high risk luminal B     07/20/2015 - 09/21/2015 Chemotherapy    Taxotere and Cytoxan adjuvant chemotherapy  4 cycles      CHIEF COMPLIANT: Starting radiation this week  INTERVAL HISTORY: Jessica Mullins is a 82 year old with above-mentioned history of left breast cancer treated with lumpectomy and completed adjuvant chemotherapy with Taxotere and Cytoxan for 4 cycles. She had a very prolonged wound healing issue. She finally healed up and is ready to start adjuvant radiation therapy. She had seen [facility] surgeons and they thought that she should have another resection primarily because it has been a gap between her surgery chemotherapy and potential start of radiation. She had a breast MRI which did not show any evidence of disease recurrence. However patient was reluctant to undergo any further surgery is given her first surgery experience. Her surgeon and I agree with her plan. She had met with Dr. Lisbeth Renshaw and is scheduled to start adjuvant radiation this Thursday.  REVIEW OF SYSTEMS:   Constitutional: Denies fevers, chills or abnormal weight  loss Eyes: Denies blurriness of vision Ears, nose, mouth, throat, and face: Denies mucositis or sore throat Respiratory: Denies cough, dyspnea or wheezes Cardiovascular: Denies palpitation, chest discomfort Gastrointestinal:  Denies nausea, heartburn or change in bowel habits Skin: Denies abnormal skin rashes Lymphatics: Denies new lymphadenopathy or easy bruising Neurological:Denies numbness, tingling or new weaknesses Behavioral/Psych: Mood is stable, no new changes  Extremities: No lower extremity edema Breast: Finally healed up All other systems were reviewed with the patient and are negative.  I have reviewed the past medical history, past surgical history, social history and family history with the patient and they are unchanged from previous note.  ALLERGIES:  is allergic to sulfa antibiotics; tape; glucosamine forte [nutritional supplements]; and latex.  MEDICATIONS:  Current Outpatient Prescriptions  Medication Sig Dispense Refill  . alendronate (FOSAMAX) 70 MG tablet TAKE 1 TABLET (70 MG TOTAL) BY MOUTH EVERY SEVEN DAYS. TAKE WITH A FULL GLASS OF WATER AT NOON TIME ON EMPTY STOMACH. 4 tablet 11  . apixaban (ELIQUIS) 5 MG TABS tablet Take 1 tablet (5 mg total) by mouth 2 (two) times daily. 60 tablet 3  . Biotin (BIOTIN 5000) 5 MG CAPS Take 5 mg by mouth daily.     . Calcium Carbonate-Vitamin D (CALTRATE 600+D) 600-400 MG-UNIT per tablet Take 1 tablet by mouth daily.     . Cholecalciferol (VITAMIN D3) 1000 UNITS CAPS Take 1 capsule by mouth daily.      . cycloSPORINE (RESTASIS) 0.05 % ophthalmic emulsion Apply 1 drop to eye 2 (  two) times daily.    [name] diltiazem (CARDIZEM CD) 180 MG 24 hr capsule TAKE 1 CAPSULE (180 MG TOTAL) BY MOUTH DAILY. 30 capsule 3  . flecainide (TAMBOCOR) 50 MG tablet Take 50 mg by mouth 2 (two) times daily.    [name] loratadine (CLARITIN) 10 MG tablet Take 10 mg by mouth daily.    . Multiple Vitamins-Minerals (MULTIVITAMIN ADULT PO) Take 1 tablet by mouth 2  (two) times daily.     . Omega-3 Fatty Acids (FISH OIL PO) Take 1 tablet by mouth daily.     [name] omeprazole (PRILOSEC) 20 MG capsule TAKE 1 CAPSULE (20 MG TOTAL) BY MOUTH DAILY. 30 capsule 11  . pravastatin (PRAVACHOL) 40 MG tablet Take 1 tablet (40 mg total) by mouth daily. Repeat labs are due now 90 tablet 0  . Propylene Glycol 0.6 % SOLN Apply 1 drop to eye daily as needed (dry eyes).     . Psyllium (METAMUCIL PO) Take 15 mLs by mouth daily.     . RESTASIS 0.05 % ophthalmic emulsion Place 1 drop into both eyes 2 (two) times daily.    . Turmeric 500 MG CAPS Take 1 capsule by mouth daily.      No current facility-administered medications for this visit.    Facility-Administered Medications Ordered in Other Visits  Medication Dose Route Frequency Provider Last Rate Last Dose  . sodium chloride 0.9 % injection 10 mL  10 mL Intracatheter PRN Nicholas Lose, MD   10 mL at 09/21/15 1717    PHYSICAL EXAMINATION: ECOG PERFORMANCE STATUS: 0 - Asymptomatic  Vitals:   01/18/16 1124  BP: 138/86  Pulse: 66  Resp: 17  Temp: 98.6 F (37 C)   Filed Weights   01/18/16 1124  Weight: 148 lb 1.6 oz (67.2 kg)    GENERAL:alert, no distress and comfortable SKIN: skin color, texture, turgor are normal, no rashes or significant lesions EYES: normal, Conjunctiva are pink and non-injected, sclera clear OROPHARYNX:no exudate, no erythema and lips, buccal mucosa, and tongue normal  NECK: supple, thyroid normal size, non-tender, without nodularity LYMPH:  no palpable lymphadenopathy in the cervical, axillary or inguinal LUNGS: clear to auscultation and percussion with normal breathing effort HEART: regular rate & rhythm and no murmurs and no lower extremity edema ABDOMEN:abdomen soft, non-tender and normal bowel sounds MUSCULOSKELETAL:no cyanosis of digits and no clubbing  NEURO: alert & oriented x 3 with fluent speech, no focal motor/sensory deficits EXTREMITIES: No lower extremity edema  LABORATORY  DATA:  I have reviewed the data as listed   Chemistry      Component Value Date/Time   NA 140 09/21/2015 1318   K 4.8 09/21/2015 1318   CL 107 07/15/2015 1220   CO2 30 (H) 09/21/2015 1318   BUN 9.5 09/21/2015 1318   CREATININE 0.8 09/21/2015 1318      Component Value Date/Time   CALCIUM 10.3 09/21/2015 1318   ALKPHOS 76 09/21/2015 1318   AST 19 09/21/2015 1318   ALT 17 09/21/2015 1318   BILITOT 0.31 09/21/2015 1318       Lab Results  Component Value Date   WBC 8.0 10/21/2015   HGB 12.7 10/21/2015   HCT 37.9 10/21/2015   MCV 86.1 10/21/2015   PLT 294 10/21/2015   NEUTROABS 5.9 10/21/2015     ASSESSMENT & PLAN:  Cancer of central portion of left female breast ([place]) Left lumpectomy 06/11/2015: DCIS, left additional margin: IDC 1.3 cm + DCIS 1/4 LN positive, grade 2, margins negative,  LVI present, ER 0%, PR 0%, HER-2 negative ratio 0.60, Ki-67 5%, T1cN1a stage II a , Mammaprint high risk ( 5 year risk 22%,10 year risk 29%) luminal type B, Distant metastasis free survival at 5 years 88% with chemotherapy. Adjuvant chemotherapy completed 09/21/2015 Taxotere and Cytoxan 4 cycles Adjuvant radiation delayed due to wound healing issues (will be starting soon)  Prior history of melanoma  Return to clinic in 6 months for follow-up and surveillance.   No orders of the defined types were placed in this encounter.  The patient has a good understanding of the overall plan. she agrees with it. she will call with any problems that may develop before the next visit here.   Rulon Eisenmenger, MD 01/18/16

## 2016-01-18 NOTE — Telephone Encounter (Signed)
appt made and avs printed °

## 2016-01-20 ENCOUNTER — Ambulatory Visit
Admission: RE | Admit: 2016-01-20 | Discharge: 2016-01-20 | Disposition: A | Discharge status: Home / Self Care | Payer: Medicare PPO | Source: Ambulatory Visit | Attending: Radiation Oncology | Admitting: Radiation Oncology

## 2016-01-20 DIAGNOSIS — C50112 Malignant neoplasm of central portion of left female breast: Secondary | ICD-10-CM | POA: Diagnosis not present

## 2016-01-20 MED ORDER — ALRA NON-METALLIC DEODORANT (RAD-ONC)
1.0000 "application " | Freq: Once | TOPICAL | Status: AC
  Administered 2016-01-20: 1 via TOPICAL

## 2016-01-20 MED ORDER — RADIAPLEXRX EX GEL
Freq: Once | CUTANEOUS | Status: AC
  Administered 2016-01-20: 10:00:00 via TOPICAL

## 2016-01-20 NOTE — Progress Notes (Signed)
Pt education breast, radiation therapy and you book, my business card, radiaplex gel, alra deodorant given, discussed ways to manage side effects, fatigue, skin irritation, pain, soreness,swelling of breast, use electric shaver, increase protein in diet,stay hydrated, teach back given

## 2016-01-21 ENCOUNTER — Ambulatory Visit: Payer: Medicare PPO

## 2016-01-21 ENCOUNTER — Inpatient Hospital Stay: Admission: RE | Admit: 2016-01-21 | Payer: Self-pay | Source: Ambulatory Visit | Admitting: Radiation Oncology

## 2016-01-21 DIAGNOSIS — C50112 Malignant neoplasm of central portion of left female breast: Secondary | ICD-10-CM | POA: Diagnosis not present

## 2016-01-22 ENCOUNTER — Ambulatory Visit
Admission: RE | Admit: 2016-01-22 | Discharge: 2016-01-22 | Disposition: A | Discharge status: Home / Self Care | Payer: Medicare PPO | Source: Ambulatory Visit | Attending: Radiation Oncology | Admitting: Radiation Oncology

## 2016-01-22 VITALS — BP 149/80 | HR 63 | Temp 98.4°F | Ht 60.5 in | Wt 147.1 lb

## 2016-01-22 DIAGNOSIS — C50112 Malignant neoplasm of central portion of left female breast: Secondary | ICD-10-CM

## 2016-01-22 NOTE — Progress Notes (Signed)
Department of Radiation Oncology  Phone:  [phone] Fax:        [phone]  Weekly Treatment Note    Name: Jessica Mullins Date: 01/22/2016 MRN: [id number] DOB: 07-19-1930   Diagnosis:     ICD-9-CM ICD-10-CM   1. Cancer of central portion of left female breast ([place]) 174.1 C50.112      Current dose: 5 Gy  Current fraction: 2   MEDICATIONS: Current Outpatient Prescriptions  Medication Sig Dispense Refill  . alendronate (FOSAMAX) 70 MG tablet TAKE 1 TABLET (70 MG TOTAL) BY MOUTH EVERY SEVEN DAYS. TAKE WITH A FULL GLASS OF WATER AT NOON TIME ON EMPTY STOMACH. 4 tablet 11  . apixaban (ELIQUIS) 5 MG TABS tablet Take 1 tablet (5 mg total) by mouth 2 (two) times daily. 60 tablet 3  . Biotin (BIOTIN 5000) 5 MG CAPS Take 5 mg by mouth daily.     . Calcium Carbonate-Vitamin D (CALTRATE 600+D) 600-400 MG-UNIT per tablet Take 1 tablet by mouth daily.     . Cholecalciferol (VITAMIN D3) 1000 UNITS CAPS Take 1 capsule by mouth daily.      . cycloSPORINE (RESTASIS) 0.05 % ophthalmic emulsion Apply 1 drop to eye 2 (two) times daily.    [name] diltiazem (CARDIZEM CD) 180 MG 24 hr capsule TAKE 1 CAPSULE (180 MG TOTAL) BY MOUTH DAILY. 30 capsule 3  . flecainide (TAMBOCOR) 50 MG tablet Take 50 mg by mouth 2 (two) times daily.    . hyaluronate sodium (RADIAPLEXRX) GEL Apply 1 application topically 2 (two) times daily.    [name] loratadine (CLARITIN) 10 MG tablet Take 10 mg by mouth daily.    . non-metallic deodorant Jethro Poling) MISC Apply 1 application topically daily as needed.    . Omega-3 Fatty Acids (FISH OIL PO) Take 1 tablet by mouth daily.     [name] omeprazole (PRILOSEC) 20 MG capsule TAKE 1 CAPSULE (20 MG TOTAL) BY MOUTH DAILY. 30 capsule 11  . pravastatin (PRAVACHOL) 40 MG tablet Take 1 tablet (40 mg total) by mouth daily. Repeat labs are due now 90 tablet 0  . Propylene Glycol 0.6 % SOLN Apply 1 drop to eye daily as needed (dry eyes).     . Psyllium (METAMUCIL PO) Take 15 mLs by mouth daily.     .  Turmeric 500 MG CAPS Take 1 capsule by mouth daily.      No current facility-administered medications for this encounter.    Facility-Administered Medications Ordered in Other Encounters  Medication Dose Route Frequency Provider Last Rate Last Dose  . sodium chloride 0.9 % injection 10 mL  10 mL Intracatheter PRN Nicholas Lose, MD   10 mL at 09/21/15 1717     ALLERGIES: Sulfa antibiotics; Tape; Glucosamine forte [nutritional supplements]; and Latex   LABORATORY DATA:  Lab Results  Component Value Date   WBC 8.0 10/21/2015   HGB 12.7 10/21/2015   HCT 37.9 10/21/2015   MCV 86.1 10/21/2015   PLT 294 10/21/2015   Lab Results  Component Value Date   NA 140 09/21/2015   K 4.8 09/21/2015   CL 107 07/15/2015   CO2 30 (H) 09/21/2015   Lab Results  Component Value Date   ALT 17 09/21/2015   AST 19 09/21/2015   ALKPHOS 76 09/21/2015   BILITOT 0.31 09/21/2015     NARRATIVE: Jessica Mullins was seen today for weekly treatment management. The chart was checked and the patient's films were reviewed. Jessica Mullins has completed 2 fractions to  her left breast.  She denies having pain or fatigue.  She started using radiaplex yesterday. The skin on her left breast is pink above the nipple area.      PHYSICAL EXAMINATION: height is 5' 0.5" (1.537 m) and weight is 147 lb 1.6 oz (66.7 kg). Her oral temperature is 98.4 F (36.9 C). Her blood pressure is 149/80 (abnormal) and her pulse is 63. Her oxygen saturation is 100%.       ASSESSMENT: The patient is doing satisfactorily with treatment.  PLAN: We will continue with the patient's radiation treatment as planned.    ------------------------------------------------   Tyler Pita, MD [facility] Director and Director of Stereotactic Radiosurgery Direct Dial: [phone]  Fax: [phone] [email or web address]  Skype  [email or web address]     This document serves as a record of services personally performed by  Tyler Pita, MD. It was created on his behalf by Truddie Hidden, a trained medical scribe. The creation of this record is based on the scribe's personal observations and the provider's statements to them. This document has been checked and approved by the attending provider.

## 2016-01-22 NOTE — Progress Notes (Signed)
Jessica Mullins has completed 2 fractions to her left breast.  She denies having pain or fatigue.  She started using radiaplex yesterday. The skin on her left breast is pink above the nipple area.  BP (!) 149/80 (BP Location: Right Arm, Patient Position: Sitting)   Pulse 63   Temp 98.4 F (36.9 C) (Oral)   Ht 5' 0.5" (1.537 m)   Wt 147 lb 1.6 oz (66.7 kg)   SpO2 100%   BMI 28.26 kg/m    Wt Readings from Last 3 Encounters:  01/22/16 147 lb 1.6 oz (66.7 kg)  01/18/16 148 lb 1.6 oz (67.2 kg)  01/06/16 149 lb 11.2 oz (67.9 kg)

## 2016-01-25 ENCOUNTER — Ambulatory Visit
Admission: RE | Admit: 2016-01-25 | Discharge: 2016-01-25 | Disposition: A | Discharge status: Home / Self Care | Payer: Medicare PPO | Source: Ambulatory Visit | Attending: Radiation Oncology | Admitting: Radiation Oncology

## 2016-01-25 DIAGNOSIS — C50112 Malignant neoplasm of central portion of left female breast: Secondary | ICD-10-CM | POA: Diagnosis not present

## 2016-01-26 ENCOUNTER — Ambulatory Visit
Admission: RE | Admit: 2016-01-26 | Discharge: 2016-01-26 | Disposition: A | Discharge status: Home / Self Care | Payer: Medicare PPO | Source: Ambulatory Visit | Attending: Radiation Oncology | Admitting: Radiation Oncology

## 2016-01-26 DIAGNOSIS — C50112 Malignant neoplasm of central portion of left female breast: Secondary | ICD-10-CM | POA: Diagnosis not present

## 2016-01-27 ENCOUNTER — Ambulatory Visit
Admission: RE | Admit: 2016-01-27 | Discharge: 2016-01-27 | Disposition: A | Discharge status: Home / Self Care | Payer: Medicare PPO | Source: Ambulatory Visit | Attending: Radiation Oncology | Admitting: Radiation Oncology

## 2016-01-27 DIAGNOSIS — C50112 Malignant neoplasm of central portion of left female breast: Secondary | ICD-10-CM | POA: Diagnosis not present

## 2016-01-28 ENCOUNTER — Telehealth: Payer: Self-pay | Admitting: *Deleted

## 2016-01-28 ENCOUNTER — Ambulatory Visit
Admission: RE | Admit: 2016-01-28 | Discharge: 2016-01-28 | Disposition: A | Discharge status: Home / Self Care | Payer: Medicare PPO | Source: Ambulatory Visit | Attending: Radiation Oncology | Admitting: Radiation Oncology

## 2016-01-28 DIAGNOSIS — C50112 Malignant neoplasm of central portion of left female breast: Secondary | ICD-10-CM | POA: Diagnosis not present

## 2016-01-28 NOTE — Telephone Encounter (Signed)
  Oncology Nurse Navigator Documentation  Navigator Location: [facility] Onc (01/28/16 1300) Navigator Encounter Type: Telephone (01/28/16 1300) Telephone: [place] Call (01/28/16 1300)     Surgery Date: 06/11/15 (01/28/16 1300)   Patient Visit Type: RadOnc (01/28/16 1300) Treatment Phase: First Radiation Tx (01/28/16 1300)                            Time Spent with Patient: 15 (01/28/16 1300)

## 2016-01-29 ENCOUNTER — Encounter: Payer: Self-pay | Admitting: Radiation Oncology

## 2016-01-29 ENCOUNTER — Ambulatory Visit
Admission: RE | Admit: 2016-01-29 | Discharge: 2016-01-29 | Disposition: A | Discharge status: Home / Self Care | Payer: Medicare PPO | Source: Ambulatory Visit | Attending: Radiation Oncology | Admitting: Radiation Oncology

## 2016-01-29 VITALS — BP 140/75 | HR 73 | Temp 98.5°F | Resp 20 | Wt 148.8 lb

## 2016-01-29 DIAGNOSIS — C50112 Malignant neoplasm of central portion of left female breast: Secondary | ICD-10-CM

## 2016-01-29 DIAGNOSIS — S61401A Unspecified open wound of right hand, initial encounter: Secondary | ICD-10-CM | POA: Diagnosis not present

## 2016-01-29 NOTE — Progress Notes (Signed)
Department of Radiation Oncology  Phone:  [phone] Fax:        [phone]  Weekly Treatment Note    Name: Jessica Mullins Date: 01/29/2016 MRN: [id number] DOB: 08/23/1930   Diagnosis:     ICD-9-CM ICD-10-CM   1. Cancer of central portion of left female breast ([place]) 174.1 C50.112      Current dose: 17.5 Gy  Current fraction:7   MEDICATIONS: Current Outpatient Prescriptions  Medication Sig Dispense Refill  . alendronate (FOSAMAX) 70 MG tablet TAKE 1 TABLET (70 MG TOTAL) BY MOUTH EVERY SEVEN DAYS. TAKE WITH A FULL GLASS OF WATER AT NOON TIME ON EMPTY STOMACH. 4 tablet 11  . Biotin (BIOTIN 5000) 5 MG CAPS Take 5 mg by mouth daily.     . Calcium Carbonate-Vitamin D (CALTRATE 600+D) 600-400 MG-UNIT per tablet Take 1 tablet by mouth daily.     . Cholecalciferol (VITAMIN D3) 1000 UNITS CAPS Take 1 capsule by mouth daily.      . cycloSPORINE (RESTASIS) 0.05 % ophthalmic emulsion Apply 1 drop to eye 2 (two) times daily.    [name] diltiazem (CARDIZEM CD) 180 MG 24 hr capsule TAKE 1 CAPSULE (180 MG TOTAL) BY MOUTH DAILY. 30 capsule 3  . flecainide (TAMBOCOR) 50 MG tablet Take 50 mg by mouth 2 (two) times daily.    . hyaluronate sodium (RADIAPLEXRX) GEL Apply 1 application topically 2 (two) times daily.    [name] loratadine (CLARITIN) 10 MG tablet Take 10 mg by mouth daily.    . non-metallic deodorant Jethro Poling) MISC Apply 1 application topically daily as needed.    . Omega-3 Fatty Acids (FISH OIL PO) Take 1 tablet by mouth daily.     [name] omeprazole (PRILOSEC) 20 MG capsule TAKE 1 CAPSULE (20 MG TOTAL) BY MOUTH DAILY. 30 capsule 11  . pravastatin (PRAVACHOL) 40 MG tablet Take 1 tablet (40 mg total) by mouth daily. Repeat labs are due now 90 tablet 0  . Propylene Glycol 0.6 % SOLN Apply 1 drop to eye daily as needed (dry eyes).     . Psyllium (METAMUCIL PO) Take 15 mLs by mouth daily.     . Turmeric 500 MG CAPS Take 1 capsule by mouth daily.     [name] apixaban (ELIQUIS) 5 MG TABS tablet Take 1  tablet (5 mg total) by mouth 2 (two) times daily. (Patient not taking: Reported on 01/29/2016) 60 tablet 3   No current facility-administered medications for this encounter.    Facility-Administered Medications Ordered in Other Encounters  Medication Dose Route Frequency Provider Last Rate Last Dose  . sodium chloride 0.9 % injection 10 mL  10 mL Intracatheter PRN Nicholas Lose, MD   10 mL at 09/21/15 1717     ALLERGIES: Sulfa antibiotics; Tape; Glucosamine forte [nutritional supplements]; and Latex   LABORATORY DATA:  Lab Results  Component Value Date   WBC 8.0 10/21/2015   HGB 12.7 10/21/2015   HCT 37.9 10/21/2015   MCV 86.1 10/21/2015   PLT 294 10/21/2015   Lab Results  Component Value Date   NA 140 09/21/2015   K 4.8 09/21/2015   CL 107 07/15/2015   CO2 30 (H) 09/21/2015   Lab Results  Component Value Date   ALT 17 09/21/2015   AST 19 09/21/2015   ALKPHOS 76 09/21/2015   BILITOT 0.31 09/21/2015     NARRATIVE: Jessica Mullins was seen today for weekly treatment management. The chart was checked and the patient's films were reviewed.  Weekly  rad txs left breast 7/20 completed, mild erythema, skin intact, using radiaplex bid, appetite good, no pain stated, tired end o the day 3:19 PM BP 140/75 (BP Location: Right Leg, Patient Position: Sitting, Cuff Size: Normal)   Pulse 73   Temp 98.5 F (36.9 C) (Oral)   Resp 20   Wt 148 lb 12.8 oz (67.5 kg)   BMI 28.58 kg/m   PHYSICAL EXAMINATION: weight is 148 lb 12.8 oz (67.5 kg). Her oral temperature is 98.5 F (36.9 C). Her blood pressure is 140/75 and her pulse is 73. Her respiration is 20.        ASSESSMENT: The patient is doing satisfactorily with treatment.  PLAN: We will continue with the patient's radiation treatment as planned.

## 2016-01-29 NOTE — Progress Notes (Signed)
  Radiation Oncology         [phone]) [phone] ________________________________  Name: Jessica Mullins MRN: [id number]  Date: 01/13/2016  DOB: [birth year]   DIAGNOSIS:     ICD-9-CM ICD-10-CM   1. Cancer of central portion of left female breast ([place]) 174.1 C50.112     SIMULATION AND TREATMENT PLANNING NOTE  The patient presented for simulation prior to beginning her course of radiation treatment for her diagnosis of  left-sided breast cancer. The patient was placed in a supine position on a breast board. A customized vac-lock bag was constructed and this complex treatment device will be used on a daily basis during her treatment. In this fashion, a CT scan was obtained through the chest area and an isocenter was placed near the chest wall within the breast.  The patient will be planned to receive a course of radiation initially to a dose of 42.5 Gy. This will consist of a whole breast radiotherapy technique. To accomplish this, 2 customized blocks have been designed which will correspond to medial and lateral whole breast tangent fields. This treatment will be accomplished at 2.5 Gy per fraction. A forward planning technique will also be evaluated to determine if this approach improves the plan. It is anticipated that the patient will then receive a 7.5 Gy boost to the seroma cavity which has been contoured. This will be accomplished at 2.5 Gy per fraction.   This initial treatment will consist of a 3-D conformal technique. The seroma has been contoured as the primary target structure. Additionally, dose volume histograms of both this target as well as the lungs and heart will also be evaluated. Such an approach is necessary to ensure that the target area is adequately covered while the nearby critical  normal structures are adequately spared.  Plan:  The final anticipated total dose therefore will correspond to 50 Gy.   Special treatment procedure was performed today due to the extra time and  effort required by myself to plan and prepare this patient for deep inspiration breath hold technique.  I have determined cardiac sparing to be of benefit to this patient to prevent long term cardiac damage due to radiation of the heart.  Bellows were placed on the patient's abdomen. To facilitate cardiac sparing, the patient was coached by the radiation therapists on breath hold techniques and breathing practice was performed. Practice waveforms were obtained. The patient was then scanned while maintaining breath hold in the treatment position.  This image was then transferred over to the imaging specialist. The imaging specialist then created a fusion of the free breathing and breath hold scans using the chest wall as the stable structure. I personally reviewed the fusion in axial, coronal and sagittal image planes.  Excellent cardiac sparing was obtained.  I felt the patient is an appropriate candidate for breath hold and the patient will be treated as such.  The image fusion was then reviewed with the patient to reinforce the necessity of reproducible breath hold.      _______________________________   Jodelle Gross, MD, PhD

## 2016-01-29 NOTE — Progress Notes (Signed)
  Radiation Oncology         [phone]) [phone] ________________________________  Name: Jessica Mullins MRN: [id number]  Date: 01/13/2016  DOB: 1930-10-05  Optical Surface Tracking Plan:  Since intensity modulated radiotherapy (IMRT) and 3D conformal radiation treatment methods are predicated on accurate and precise positioning for treatment, intrafraction motion monitoring is medically necessary to ensure accurate and safe treatment delivery.  The ability to quantify intrafraction motion without excessive ionizing radiation dose can only be performed with optical surface tracking. Accordingly, surface imaging offers the opportunity to obtain 3D measurements of patient position throughout IMRT and 3D treatments without excessive radiation exposure.  I am ordering optical surface tracking for this patient's upcoming course of radiotherapy. ________________________________  Kyung Rudd, MD 01/29/2016 3:42 PM    Reference:   Particia Jasper, et al. Surface imaging-based analysis of intrafraction motion for breast radiotherapy patients.Journal of [place], n. 6, nov. 2014. ISSN [id number].   Available at: <http://www.jacmp.org/index.php/jacmp/article/view/4957>.

## 2016-01-29 NOTE — Progress Notes (Signed)
Weekly rad txs left breast 7/20 completed, mild erythema, skin intact, using radiaplex bid, appetite good, no pain stated, tired end o the day 2:19 PM BP 140/75 (BP Location: Right Leg, Patient Position: Sitting, Cuff Size: Normal)   Pulse 73   Temp 98.5 F (36.9 C) (Oral)   Resp 20   Wt 148 lb 12.8 oz (67.5 kg)   BMI 28.58 kg/m

## 2016-02-01 ENCOUNTER — Ambulatory Visit
Admission: RE | Admit: 2016-02-01 | Discharge: 2016-02-01 | Disposition: A | Discharge status: Home / Self Care | Payer: Medicare PPO | Source: Ambulatory Visit | Attending: Radiation Oncology | Admitting: Radiation Oncology

## 2016-02-01 DIAGNOSIS — C50112 Malignant neoplasm of central portion of left female breast: Secondary | ICD-10-CM | POA: Diagnosis not present

## 2016-02-01 NOTE — Progress Notes (Signed)
1415 Patient presented to the clinic following treatment. Patient requesting her right hand be wrapped. Applied neosporin to top of patient's hand, apply a non adherent dressing and secured with 9M coban. Patient tolerated this well.

## 2016-02-02 ENCOUNTER — Ambulatory Visit
Admission: RE | Admit: 2016-02-02 | Discharge: 2016-02-02 | Disposition: A | Discharge status: Home / Self Care | Payer: Medicare PPO | Source: Ambulatory Visit | Attending: Radiation Oncology | Admitting: Radiation Oncology

## 2016-02-02 DIAGNOSIS — C50112 Malignant neoplasm of central portion of left female breast: Secondary | ICD-10-CM | POA: Diagnosis not present

## 2016-02-03 ENCOUNTER — Ambulatory Visit
Admission: RE | Admit: 2016-02-03 | Discharge: 2016-02-03 | Disposition: A | Discharge status: Home / Self Care | Payer: Medicare PPO | Source: Ambulatory Visit | Attending: Radiation Oncology | Admitting: Radiation Oncology

## 2016-02-03 DIAGNOSIS — C50112 Malignant neoplasm of central portion of left female breast: Secondary | ICD-10-CM | POA: Diagnosis not present

## 2016-02-04 ENCOUNTER — Ambulatory Visit
Admission: RE | Admit: 2016-02-04 | Discharge: 2016-02-04 | Disposition: A | Discharge status: Home / Self Care | Payer: Medicare PPO | Source: Ambulatory Visit | Attending: Radiation Oncology | Admitting: Radiation Oncology

## 2016-02-04 DIAGNOSIS — C50112 Malignant neoplasm of central portion of left female breast: Secondary | ICD-10-CM | POA: Diagnosis not present

## 2016-02-05 ENCOUNTER — Encounter: Payer: Self-pay | Admitting: Radiation Oncology

## 2016-02-05 ENCOUNTER — Ambulatory Visit
Admission: RE | Admit: 2016-02-05 | Discharge: 2016-02-05 | Disposition: A | Discharge status: Home / Self Care | Payer: Medicare PPO | Source: Ambulatory Visit | Attending: Radiation Oncology | Admitting: Radiation Oncology

## 2016-02-05 VITALS — BP 169/82 | HR 72 | Temp 98.0°F | Wt 147.8 lb

## 2016-02-05 DIAGNOSIS — Z7901 Long term (current) use of anticoagulants: Secondary | ICD-10-CM | POA: Insufficient documentation

## 2016-02-05 DIAGNOSIS — Z79899 Other long term (current) drug therapy: Secondary | ICD-10-CM

## 2016-02-05 DIAGNOSIS — C50112 Malignant neoplasm of central portion of left female breast: Secondary | ICD-10-CM

## 2016-02-05 DIAGNOSIS — Z4802 Encounter for removal of sutures: Secondary | ICD-10-CM | POA: Diagnosis not present

## 2016-02-05 DIAGNOSIS — Z888 Allergy status to other drugs, medicaments and biological substances status: Secondary | ICD-10-CM

## 2016-02-05 DIAGNOSIS — Z510 Encounter for antineoplastic radiation therapy: Secondary | ICD-10-CM | POA: Insufficient documentation

## 2016-02-05 MED ORDER — RADIAPLEXRX EX GEL
Freq: Once | CUTANEOUS | Status: AC
  Administered 2016-02-05: 15:00:00 via TOPICAL

## 2016-02-05 NOTE — Progress Notes (Signed)
Department of Radiation Oncology  Phone:  [phone] Fax:        [phone]  Weekly Treatment Note    Name: Jessica Mullins Date: 02/07/2016 MRN: [id number] DOB: 1931-06-14   Diagnosis:     ICD-9-CM ICD-10-CM   1. Cancer of central portion of left female breast ([facility]) 174.1 C50.112 hyaluronate sodium (RADIAPLEXRX) gel     Current dose: 30 Gy  Current fraction: 12   MEDICATIONS: Current Outpatient Prescriptions  Medication Sig Dispense Refill  . alendronate (FOSAMAX) 70 MG tablet TAKE 1 TABLET (70 MG TOTAL) BY MOUTH EVERY SEVEN DAYS. TAKE WITH A FULL GLASS OF WATER AT NOON TIME ON EMPTY STOMACH. 4 tablet 11  . apixaban (ELIQUIS) 5 MG TABS tablet Take 1 tablet (5 mg total) by mouth 2 (two) times daily. 60 tablet 3  . Calcium Carbonate-Vitamin D (CALTRATE 600+D) 600-400 MG-UNIT per tablet Take 1 tablet by mouth daily.     . Cholecalciferol (VITAMIN D3) 1000 UNITS CAPS Take 1 capsule by mouth daily.      . cycloSPORINE (RESTASIS) 0.05 % ophthalmic emulsion Apply 1 drop to eye 2 (two) times daily.    [name] diltiazem (CARDIZEM CD) 180 MG 24 hr capsule TAKE 1 CAPSULE (180 MG TOTAL) BY MOUTH DAILY. 30 capsule 3  . flecainide (TAMBOCOR) 50 MG tablet Take 50 mg by mouth 2 (two) times daily.    . hyaluronate sodium (RADIAPLEXRX) GEL Apply 1 application topically 2 (two) times daily. 2nd tube given 02/05/16    . loratadine (CLARITIN) 10 MG tablet Take 10 mg by mouth daily.    . non-metallic deodorant Jethro Poling) MISC Apply 1 application topically daily as needed.    . Omega-3 Fatty Acids (FISH OIL PO) Take 1 tablet by mouth daily.     [name] omeprazole (PRILOSEC) 20 MG capsule TAKE 1 CAPSULE (20 MG TOTAL) BY MOUTH DAILY. 30 capsule 11  . pravastatin (PRAVACHOL) 40 MG tablet Take 1 tablet (40 mg total) by mouth daily. Repeat labs are due now 90 tablet 0  . Propylene Glycol 0.6 % SOLN Apply 1 drop to eye daily as needed (dry eyes).     . Psyllium (METAMUCIL PO) Take 15 mLs by mouth daily.     .  Turmeric 500 MG CAPS Take 1 capsule by mouth daily.     . Biotin (BIOTIN 5000) 5 MG CAPS Take 5 mg by mouth daily.      No current facility-administered medications for this encounter.    Facility-Administered Medications Ordered in Other Encounters  Medication Dose Route Frequency Provider Last Rate Last Dose  . sodium chloride 0.9 % injection 10 mL  10 mL Intracatheter PRN Nicholas Lose, MD   10 mL at 09/21/15 1717     ALLERGIES: Sulfa antibiotics; Tape; Glucosamine forte [nutritional supplements]; and Latex   LABORATORY DATA:  Lab Results  Component Value Date   WBC 8.0 10/21/2015   HGB 12.7 10/21/2015   HCT 37.9 10/21/2015   MCV 86.1 10/21/2015   PLT 294 10/21/2015   Lab Results  Component Value Date   NA 140 09/21/2015   K 4.8 09/21/2015   CL 107 07/15/2015   CO2 30 (H) 09/21/2015   Lab Results  Component Value Date   ALT 17 09/21/2015   AST 19 09/21/2015   ALKPHOS 76 09/21/2015   BILITOT 0.31 09/21/2015     NARRATIVE: Jessica Mullins was seen today for weekly treatment management. The chart was checked and the patient's films were reviewed.  Jessica Mullins returns for weekly radiation treatment to the left breast. Reports mild erythema in the treatment area. Using Radiaplex bid. Appetite is good. No complaints of pain at this time. She is doing well.   PHYSICAL EXAMINATION: weight is 147 lb 12.8 oz (67 kg). Her oral temperature is 98 F (36.7 C). Her blood pressure is 169/82 (abnormal) and her pulse is 72.      Bandage on top of right hand associated with removal of stitches. Diffuse erythema in the treatment area.  ASSESSMENT: The patient is doing satisfactorily with treatment.  PLAN: We will continue with the patient's radiation treatment as planned.    ------------------------------------------------  Jodelle Gross, MD, PhD  This document serves as a record of services personally performed by Kyung Rudd, MD. It was created on his behalf by Arlyce Harman, a trained medical scribe. The creation of this record is based on the scribe's personal observations and the provider's statements to them. This document has been checked and approved by the attending provider.

## 2016-02-05 NOTE — Progress Notes (Signed)
Weekly rad txs left breast, mild erythema, skin intact, radiaplex bid , gave 2nd tube today, appetite good, no c/o pain 2:32 PM BP (!) 169/82 (BP Location: Right Arm, Patient Position: Sitting, Cuff Size: Normal)   Pulse 72   Temp 98 F (36.7 C) (Oral)   Wt 147 lb 12.8 oz (67 kg)   BMI 28.39 kg/m   Wt Readings from Last 3 Encounters:  02/05/16 147 lb 12.8 oz (67 kg)  01/29/16 148 lb 12.8 oz (67.5 kg)  01/22/16 147 lb 1.6 oz (66.7 kg)

## 2016-02-08 ENCOUNTER — Ambulatory Visit
Admission: RE | Admit: 2016-02-08 | Discharge: 2016-02-08 | Disposition: A | Discharge status: Home / Self Care | Payer: Medicare PPO | Source: Ambulatory Visit | Attending: Radiation Oncology | Admitting: Radiation Oncology

## 2016-02-08 DIAGNOSIS — C50112 Malignant neoplasm of central portion of left female breast: Secondary | ICD-10-CM | POA: Diagnosis not present

## 2016-02-09 ENCOUNTER — Ambulatory Visit
Admission: RE | Admit: 2016-02-09 | Discharge: 2016-02-09 | Disposition: A | Discharge status: Home / Self Care | Payer: Medicare PPO | Source: Ambulatory Visit | Attending: Radiation Oncology | Admitting: Radiation Oncology

## 2016-02-09 DIAGNOSIS — C50112 Malignant neoplasm of central portion of left female breast: Secondary | ICD-10-CM | POA: Diagnosis not present

## 2016-02-10 ENCOUNTER — Ambulatory Visit
Admission: RE | Admit: 2016-02-10 | Discharge: 2016-02-10 | Disposition: A | Discharge status: Home / Self Care | Payer: Medicare PPO | Source: Ambulatory Visit | Attending: Radiation Oncology | Admitting: Radiation Oncology

## 2016-02-10 ENCOUNTER — Encounter: Payer: Self-pay | Admitting: Hematology and Oncology

## 2016-02-10 DIAGNOSIS — C50112 Malignant neoplasm of central portion of left female breast: Secondary | ICD-10-CM | POA: Diagnosis not present

## 2016-02-10 NOTE — Progress Notes (Signed)
Pt came in requesting claims and a pathology report to submit to her cancer policy.  I printed off HCFA-1500 forms for her treatment from Jan 2017 to March 2017 and the pathology report from 05/2015.

## 2016-02-11 ENCOUNTER — Ambulatory Visit
Admission: RE | Admit: 2016-02-11 | Discharge: 2016-02-11 | Disposition: A | Discharge status: Home / Self Care | Payer: Medicare PPO | Source: Ambulatory Visit | Attending: Radiation Oncology | Admitting: Radiation Oncology

## 2016-02-11 DIAGNOSIS — C50112 Malignant neoplasm of central portion of left female breast: Secondary | ICD-10-CM | POA: Diagnosis not present

## 2016-02-12 ENCOUNTER — Encounter: Payer: Self-pay | Admitting: Radiation Oncology

## 2016-02-12 ENCOUNTER — Ambulatory Visit
Admission: RE | Admit: 2016-02-12 | Discharge: 2016-02-12 | Disposition: A | Discharge status: Home / Self Care | Payer: Medicare PPO | Source: Ambulatory Visit | Attending: Radiation Oncology | Admitting: Radiation Oncology

## 2016-02-12 VITALS — BP 147/80 | HR 70 | Temp 98.6°F | Resp 20 | Wt 148.6 lb

## 2016-02-12 DIAGNOSIS — C50112 Malignant neoplasm of central portion of left female breast: Secondary | ICD-10-CM

## 2016-02-12 MED ORDER — SONAFINE EX EMUL
1.0000 "application " | Freq: Two times a day (BID) | CUTANEOUS | Status: DC
  Administered 2016-02-12: 1 via TOPICAL

## 2016-02-12 NOTE — Progress Notes (Signed)
Weekly rad txs left breast, gave sonafine cream, increased dermatitis and increased itching , appetite good, no other c/o, no fatigue, take rest periods when she over does thnings satted 2:32 PM  BP (!) 147/80 (BP Location: Right Arm, Patient Position: Sitting, Cuff Size: Normal)   Pulse 70   Temp 98.6 F (37 C) (Oral)   Resp 20   Wt 148 lb 9.6 oz (67.4 kg)   BMI 28.54 kg/m   Wt Readings from Last 3 Encounters:  02/12/16 148 lb 9.6 oz (67.4 kg)  02/05/16 147 lb 12.8 oz (67 kg)  01/29/16 148 lb 12.8 oz (67.5 kg)

## 2016-02-12 NOTE — Progress Notes (Signed)
Complex simulation note  Diagnosis: Left-sided breast cancer  Narrative The patient has initially been planned to receive a course of whole breast radiation to a dose of 42.5 Gy in 17 fractions. The patient will now receive an additional boost to the seroma cavity which has been contoured. This will correspond to a boost of 7.5 Gy at 2.5 Gy per fraction. To accomplish this, an additional 3 customized blocks have been designed for this purpose. A complex isodose plan is requested to ensure that the target area is adequately covered with radiation dose and that the nearby normal structures such as the lung are adequately spared. The patient's final total dose will be 50 Gy.  ------------------------------------------------  Jessica Mullins [name], MD, PhD 

## 2016-02-12 NOTE — Progress Notes (Signed)
Department of Radiation Oncology  Phone:  [phone] Fax:        [phone]  Weekly Treatment Note    Name: Jessica Mullins Date: 02/12/2016 MRN: [id number] DOB: 28-Feb-1931   Diagnosis:     ICD-9-CM ICD-10-CM   1. Cancer of central portion of left female breast (HCC) 174.1 C50.112 SONAFINE emulsion 1 application     Current dose: 42.5 Gy  Current fraction: 17   MEDICATIONS: Current Outpatient Prescriptions  Medication Sig Dispense Refill  . alendronate (FOSAMAX) 70 MG tablet TAKE 1 TABLET (70 MG TOTAL) BY MOUTH EVERY SEVEN DAYS. TAKE WITH A FULL GLASS OF WATER AT NOON TIME ON EMPTY STOMACH. 4 tablet 11  . apixaban (ELIQUIS) 5 MG TABS tablet Take 1 tablet (5 mg total) by mouth 2 (two) times daily. 60 tablet 3  . Biotin (BIOTIN 5000) 5 MG CAPS Take 5 mg by mouth daily.     . Calcium Carbonate-Vitamin D (CALTRATE 600+D) 600-400 MG-UNIT per tablet Take 1 tablet by mouth daily.     . Cholecalciferol (VITAMIN D3) 1000 UNITS CAPS Take 1 capsule by mouth daily.      . cycloSPORINE (RESTASIS) 0.05 % ophthalmic emulsion Apply 1 drop to eye 2 (two) times daily.    [name] diltiazem (CARDIZEM CD) 180 MG 24 hr capsule TAKE 1 CAPSULE (180 MG TOTAL) BY MOUTH DAILY. 30 capsule 3  . flecainide (TAMBOCOR) 50 MG tablet Take 50 mg by mouth 2 (two) times daily.    . hyaluronate sodium (RADIAPLEXRX) GEL Apply 1 application topically 2 (two) times daily. 2nd tube given 02/05/16    . loratadine (CLARITIN) 10 MG tablet Take 10 mg by mouth daily.    . non-metallic deodorant Jethro Poling) MISC Apply 1 application topically daily as needed.    . Omega-3 Fatty Acids (FISH OIL PO) Take 1 tablet by mouth daily.     [name] omeprazole (PRILOSEC) 20 MG capsule TAKE 1 CAPSULE (20 MG TOTAL) BY MOUTH DAILY. 30 capsule 11  . pravastatin (PRAVACHOL) 40 MG tablet Take 1 tablet (40 mg total) by mouth daily. Repeat labs are due now 90 tablet 0  . Propylene Glycol 0.6 % SOLN Apply 1 drop to eye daily as needed (dry eyes).     .  Psyllium (METAMUCIL PO) Take 15 mLs by mouth daily.     . Turmeric 500 MG CAPS Take 1 capsule by mouth daily.      Current Facility-Administered Medications  Medication Dose Route Frequency Provider Last Rate Last Dose  . SONAFINE emulsion 1 application  1 application Topical BID Hayden Pedro, PA-C   1 application at 0000000 1430   Facility-Administered Medications Ordered in Other Encounters  Medication Dose Route Frequency Provider Last Rate Last Dose  . sodium chloride 0.9 % injection 10 mL  10 mL Intracatheter PRN Nicholas Lose, MD   10 mL at 09/21/15 1717     ALLERGIES: Sulfa antibiotics; Tape; Glucosamine forte [nutritional supplements]; and Latex   LABORATORY DATA:  Lab Results  Component Value Date   WBC 8.0 10/21/2015   HGB 12.7 10/21/2015   HCT 37.9 10/21/2015   MCV 86.1 10/21/2015   PLT 294 10/21/2015   Lab Results  Component Value Date   NA 140 09/21/2015   K 4.8 09/21/2015   CL 107 07/15/2015   CO2 30 (H) 09/21/2015   Lab Results  Component Value Date   ALT 17 09/21/2015   AST 19 09/21/2015   ALKPHOS 76 09/21/2015   BILITOT  0.31 09/21/2015     NARRATIVE: Jessica Mullins was seen today for weekly treatment management. The chart was checked and the patient's films were reviewed.  Weekly rad txs left breast, gave sonafine cream, increased dermatitis and increased itching , appetite good, no other c/o, no fatigue, take rest periods when she over does thnings satted 3:06 PM  BP (!) 147/80 (BP Location: Right Arm, Patient Position: Sitting, Cuff Size: Normal)   Pulse 70   Temp 98.6 F (37 C) (Oral)   Resp 20   Wt 148 lb 9.6 oz (67.4 kg)   BMI 28.54 kg/m   Wt Readings from Last 3 Encounters:  02/12/16 148 lb 9.6 oz (67.4 kg)  02/05/16 147 lb 12.8 oz (67 kg)  01/29/16 148 lb 12.8 oz (67.5 kg)    PHYSICAL EXAMINATION: weight is 148 lb 9.6 oz (67.4 kg). Her oral temperature is 98.6 F (37 C). Her blood pressure is 147/80 (abnormal) and her  pulse is 70. Her respiration is 20.        ASSESSMENT: The patient is doing satisfactorily with treatment.  PLAN: We will continue with the patient's radiation treatment as planned. She will finish her treatment next week and then follow-up in one month.

## 2016-02-15 ENCOUNTER — Ambulatory Visit
Admission: RE | Admit: 2016-02-15 | Discharge: 2016-02-15 | Disposition: A | Discharge status: Home / Self Care | Payer: Medicare PPO | Source: Ambulatory Visit | Attending: Radiation Oncology | Admitting: Radiation Oncology

## 2016-02-15 ENCOUNTER — Ambulatory Visit: Payer: Medicare PPO | Admitting: Family Medicine

## 2016-02-15 DIAGNOSIS — C50112 Malignant neoplasm of central portion of left female breast: Secondary | ICD-10-CM | POA: Diagnosis not present

## 2016-02-16 ENCOUNTER — Ambulatory Visit
Admission: RE | Admit: 2016-02-16 | Discharge: 2016-02-16 | Disposition: A | Discharge status: Home / Self Care | Payer: Medicare PPO | Source: Ambulatory Visit | Attending: Radiation Oncology | Admitting: Radiation Oncology

## 2016-02-16 DIAGNOSIS — C50112 Malignant neoplasm of central portion of left female breast: Secondary | ICD-10-CM | POA: Diagnosis not present

## 2016-02-17 ENCOUNTER — Ambulatory Visit
Admission: RE | Admit: 2016-02-17 | Discharge: 2016-02-17 | Disposition: A | Discharge status: Home / Self Care | Payer: Medicare PPO | Source: Ambulatory Visit | Attending: Radiation Oncology | Admitting: Radiation Oncology

## 2016-02-17 ENCOUNTER — Encounter: Payer: Self-pay | Admitting: Radiation Oncology

## 2016-02-17 DIAGNOSIS — C50112 Malignant neoplasm of central portion of left female breast: Secondary | ICD-10-CM | POA: Diagnosis not present

## 2016-02-18 ENCOUNTER — Ambulatory Visit: Payer: Medicare PPO | Admitting: Hematology and Oncology

## 2016-02-19 ENCOUNTER — Telehealth: Payer: Self-pay | Admitting: *Deleted

## 2016-02-19 DIAGNOSIS — C50112 Malignant neoplasm of central portion of left female breast: Secondary | ICD-10-CM

## 2016-02-19 NOTE — Telephone Encounter (Signed)
  Oncology Nurse Navigator Documentation  Navigator Location: [facility] (02/19/16 1400) Navigator Encounter Type: Telephone (02/19/16 1400) Telephone: [facility] Call (02/19/16 1400)         Patient Visit Type: [id number] (02/19/16 1400) Treatment Phase: Final Radiation Tx (02/19/16 1400)                            Time Spent with Patient: 15 (02/19/16 1400)

## 2016-02-21 ENCOUNTER — Telehealth: Payer: Self-pay | Admitting: Hematology and Oncology

## 2016-02-21 NOTE — Telephone Encounter (Signed)
Lvm advising appt 11/10 @ 1pm. Also, mailed appt calendar.

## 2016-03-01 ENCOUNTER — Ambulatory Visit (INDEPENDENT_AMBULATORY_CARE_PROVIDER_SITE_OTHER): Payer: Medicare PPO | Admitting: Family Medicine

## 2016-03-01 VITALS — BP 148/88 | HR 62 | Temp 98.2°F | Wt 147.6 lb

## 2016-03-01 DIAGNOSIS — E785 Hyperlipidemia, unspecified: Secondary | ICD-10-CM | POA: Diagnosis not present

## 2016-03-01 LAB — COMPREHENSIVE METABOLIC PANEL
ALT: 17 U/L (ref 0–35)
AST: 16 U/L (ref 0–37)
Albumin: 4.1 g/dL (ref 3.5–5.2)
Alkaline Phosphatase: 61 U/L (ref 39–117)
BUN: 14 mg/dL (ref 6–23)
CO2: 33 mEq/L — ABNORMAL HIGH (ref 19–32)
Calcium: 9.6 mg/dL (ref 8.4–10.5)
Chloride: 104 mEq/L (ref 96–112)
Creatinine, Ser: 0.79 mg/dL (ref 0.40–1.20)
GFR: 73.83 mL/min (ref >60.00)
Glucose, Bld: 93 mg/dL (ref 70–99)
Potassium: 5.2 mEq/L — ABNORMAL HIGH (ref 3.5–5.1)
Sodium: 139 mEq/L (ref 135–145)
Total Bilirubin: 0.4 mg/dL (ref 0.2–1.2)
Total Protein: 6.6 g/dL (ref 6.0–8.3)

## 2016-03-01 LAB — LIPID PANEL
Cholesterol: 152 mg/dL (ref 0–200)
HDL: 59.8 mg/dL (ref >39.00)
LDL Cholesterol: 70 mg/dL (ref 0–99)
NonHDL: 92.64
Total CHOL/HDL Ratio: 3
Triglycerides: 111 mg/dL (ref 0.0–149.0)
VLDL: 22.2 mg/dL (ref 0.0–40.0)

## 2016-03-01 MED ORDER — PRAVASTATIN SODIUM 40 MG PO TABS: 40.0000 mg | ORAL_TABLET | Freq: Every day | ORAL | 0 refills | Status: DC

## 2016-03-01 NOTE — Progress Notes (Signed)
Pre visit review using our clinic review tool, if applicable. No additional management support is needed unless otherwise documented below in the visit note. 

## 2016-03-01 NOTE — Patient Instructions (Signed)

## 2016-03-01 NOTE — Progress Notes (Signed)
Patient ID: Jessica Mullins, female    DOB: [birth year]  Age: 82 y.o. MRN: [id number]    Subjective:  Subjective  HPI Jessica Mullins presents for f/u cholesterol. No complaints  Review of Systems  Constitutional: Negative for activity change, appetite change, fatigue and unexpected weight change.  Respiratory: Negative for cough and shortness of breath.   Cardiovascular: Negative for chest pain and palpitations.  Psychiatric/Behavioral: Negative for behavioral problems and dysphoric mood. The patient is not nervous/anxious.     History Past Medical History:  Diagnosis Date  . Allergy   . Ankle fracture, left   . Breast cancer ([place]) 1115/16   left   . Bursitis of right shoulder   . Cataract   . Family history of cancer   . Fracture of right wrist   . GERD (gastroesophageal reflux disease)   . Hyperlipidemia   . Hypertension   . Skin cancer 2000   melanoma and basal cell  . Vaso vagal episode    during preparation for colonoscopy    She has a past surgical history that includes Appendectomy; Ganglion cyst excision; Breast lumpectomy; Lipiflow procedure; Cataract extraction (2015); Abdominal hysterectomy (1988); fiberadenoma (Bilateral, 1978, 1980); Colonoscopy (2010); DG  BONE DENSITY ([facility]); Eye surgery (Bilateral); and Breast lumpectomy with radioactive seed and sentinel lymph node biopsy (Left, 06/11/2015).   Her family history includes Cancer in her mother; Coronary artery disease in her brother; Diabetes in her brother and maternal aunt; Heart disease in her brother; Heart disease (age of onset: 92) in her father; Hyperlipidemia in her brother; Hypertension in her brother; Lung cancer (age of onset: 11) in her sister.She reports that she has never smoked. She has never used smokeless tobacco. She reports that she does not drink alcohol or use drugs.  Current Outpatient Prescriptions on File Prior to Visit  Medication Sig Dispense Refill  . alendronate (FOSAMAX) 70 MG  tablet TAKE 1 TABLET (70 MG TOTAL) BY MOUTH EVERY SEVEN DAYS. TAKE WITH A FULL GLASS OF WATER AT NOON TIME ON EMPTY STOMACH. 4 tablet 11  . apixaban (ELIQUIS) 5 MG TABS tablet Take 1 tablet (5 mg total) by mouth 2 (two) times daily. 60 tablet 3  . Biotin (BIOTIN 5000) 5 MG CAPS Take 5 mg by mouth daily.     . Calcium Carbonate-Vitamin D (CALTRATE 600+D) 600-400 MG-UNIT per tablet Take 1 tablet by mouth daily.     . Cholecalciferol (VITAMIN D3) 1000 UNITS CAPS Take 1 capsule by mouth daily.      . cycloSPORINE (RESTASIS) 0.05 % ophthalmic emulsion Apply 1 drop to eye 2 (two) times daily.    [name] diltiazem (CARDIZEM CD) 180 MG 24 hr capsule TAKE 1 CAPSULE (180 MG TOTAL) BY MOUTH DAILY. 30 capsule 3  . flecainide (TAMBOCOR) 50 MG tablet Take 50 mg by mouth 2 (two) times daily.    . hyaluronate sodium (RADIAPLEXRX) GEL Apply 1 application topically 2 (two) times daily. 2nd tube given 02/05/16    . loratadine (CLARITIN) 10 MG tablet Take 10 mg by mouth daily.    . non-metallic deodorant Jessica Mullins) MISC Apply 1 application topically daily as needed.    . Omega-3 Fatty Acids (FISH OIL PO) Take 1 tablet by mouth daily.     [name] omeprazole (PRILOSEC) 20 MG capsule TAKE 1 CAPSULE (20 MG TOTAL) BY MOUTH DAILY. 30 capsule 11  . Propylene Glycol 0.6 % SOLN Apply 1 drop to eye daily as needed (dry eyes).     [name]  Psyllium (METAMUCIL PO) Take 15 mLs by mouth daily.     . Turmeric 500 MG CAPS Take 1 capsule by mouth daily.      Current Facility-Administered Medications on File Prior to Visit  Medication Dose Route Frequency Provider Last Rate Last Dose  . sodium chloride 0.9 % injection 10 mL  10 mL Intracatheter PRN Jessica Lose, MD   10 mL at 09/21/15 1717     Objective:  Objective  Physical Exam  Constitutional: She is oriented to person, place, and time. She appears well-developed and well-nourished.  HENT:  Head: Normocephalic and atraumatic.  Eyes: Conjunctivae and EOM are normal.  Neck: Normal range of motion.  Neck supple. No JVD present. Carotid bruit is not present. No thyromegaly present.  Cardiovascular: Normal rate, regular rhythm and normal heart sounds.   No murmur heard. Pulmonary/Chest: Effort normal and breath sounds normal. No respiratory distress. She has no wheezes. She has no rales. She exhibits no tenderness.  Musculoskeletal: She exhibits no edema.  Neurological: She is alert and oriented to person, place, and time.  Psychiatric: She has a normal mood and affect. Her behavior is normal. Judgment and thought content normal.  Nursing note and vitals reviewed.  BP (!) 148/88 (BP Location: Left Arm, Patient Position: Sitting, Cuff Size: Normal)   Pulse 62   Temp 98.2 F (36.8 C) (Oral)   Wt 147 lb 9.6 oz (67 kg)   SpO2 97%   BMI 28.35 kg/m  Wt Readings from Last 3 Encounters:  03/01/16 147 lb 9.6 oz (67 kg)  02/12/16 148 lb 9.6 oz (67.4 kg)  02/05/16 147 lb 12.8 oz (67 kg)     Lab Results  Component Value Date   WBC 8.0 10/21/2015   HGB 12.7 10/21/2015   HCT 37.9 10/21/2015   PLT 294 10/21/2015   GLUCOSE 93 03/01/2016   CHOL 152 03/01/2016   TRIG 111.0 03/01/2016   HDL 59.80 03/01/2016   LDLDIRECT 153.4 11/16/2009   LDLCALC 70 03/01/2016   ALT 17 03/01/2016   AST 16 03/01/2016   NA 139 03/01/2016   K 5.2 (H) 03/01/2016   CL 104 03/01/2016   CREATININE 0.79 03/01/2016   BUN 14 03/01/2016   CO2 33 (H) 03/01/2016   TSH 0.62 03/09/2011   INR 1.20 10/21/2015    No results found.   Assessment & Plan:  Plan  I am having Jessica Mullins maintain her Calcium Carbonate-Vitamin D, Vitamin D3, Omega-3 Fatty Acids (FISH OIL PO), Biotin, Turmeric, Propylene Glycol, alendronate, Psyllium (METAMUCIL PO), flecainide, loratadine, cycloSPORINE, diltiazem, apixaban, omeprazole, hyaluronate sodium, non-metallic deodorant, and pravastatin.  Meds ordered this encounter  Medications  . pravastatin (PRAVACHOL) 40 MG tablet    Sig: Take 1 tablet (40 mg total) by mouth daily. Repeat  labs are due now    Dispense:  90 tablet    Refill:  0    Problem List Items Addressed This Visit    None    Visit Diagnoses    Hyperlipidemia LDL goal <100    -  Primary   Relevant Medications   pravastatin (PRAVACHOL) 40 MG tablet   Other Relevant Orders   Comprehensive metabolic panel (Completed)   Lipid panel (Completed)      Follow-up: Return in about 6 months (around 08/29/2016) for hyperlipidemia.  Ann Held, DO

## 2016-03-02 ENCOUNTER — Encounter: Payer: Self-pay | Admitting: Family Medicine

## 2016-03-04 ENCOUNTER — Encounter: Payer: Self-pay | Admitting: Hematology and Oncology

## 2016-03-04 ENCOUNTER — Other Ambulatory Visit (HOSPITAL_COMMUNITY): Payer: Self-pay | Admitting: Internal Medicine

## 2016-03-04 DIAGNOSIS — I480 Paroxysmal atrial fibrillation: Secondary | ICD-10-CM

## 2016-03-04 NOTE — Progress Notes (Signed)
I called and left message for patient to call billing for bills she needs for cancer policy. I left billing ph# for her [phone]

## 2016-03-04 NOTE — Progress Notes (Signed)
  Radiation Oncology         [phone]) [phone] ________________________________  Name: Jessica Mullins MRN: [id number]  Date: 02/17/2016  DOB: [birth year]  End of Treatment Note  Diagnosis:   Left-sided breast cancer     Indication for treatment:  Curative       Radiation treatment dates:   01/21/2016 through 02/17/2016  Site/dose:   The patient initially received a dose of 42.5 Gy in 17 fractions to the breast using whole-breast tangent fields. This was delivered using a 3-D conformal technique. The patient then received a boost to the seroma. This delivered an additional 7.5 Gy in 3 fractions using a 3 field photon technique due to the depth of the seroma. The total dose was 50 Gy.  Narrative: The patient tolerated radiation treatment relatively well.   The patient had some expected skin irritation as she progressed during treatment. Moist desquamation was not present at the end of treatment.  Plan: The patient has completed radiation treatment. The patient will return to radiation oncology clinic for routine followup in one month. I advised the patient to call or return sooner if they have any questions or concerns related to their recovery or treatment. ________________________________  Jodelle Gross, M.D., Ph.D.

## 2016-03-14 ENCOUNTER — Telehealth: Payer: Self-pay | Admitting: *Deleted

## 2016-03-14 NOTE — Telephone Encounter (Signed)
Returned call patient stated she has developed a worse ing rash on her breast, she has been using sonafine cream and radiaplex, but not helping, asked if she could come in this Wednesday at 1000 am to see Shona Simpson for follow up instead of Oct 3,20167, she stated she would be here Wednesday, thanked this Rn for calling back so soon 10:47 AM

## 2016-03-16 ENCOUNTER — Encounter: Payer: Self-pay | Admitting: Radiation Oncology

## 2016-03-16 ENCOUNTER — Ambulatory Visit
Admission: RE | Admit: 2016-03-16 | Discharge: 2016-03-16 | Disposition: A | Discharge status: Home / Self Care | Payer: Medicare PPO | Source: Ambulatory Visit | Attending: Radiation Oncology | Admitting: Radiation Oncology

## 2016-03-16 VITALS — BP 130/68 | HR 60 | Temp 98.0°F | Ht 60.5 in | Wt 150.3 lb

## 2016-03-16 DIAGNOSIS — Z91040 Latex allergy status: Secondary | ICD-10-CM | POA: Insufficient documentation

## 2016-03-16 DIAGNOSIS — B372 Candidiasis of skin and nail: Secondary | ICD-10-CM | POA: Diagnosis not present

## 2016-03-16 DIAGNOSIS — Z7901 Long term (current) use of anticoagulants: Secondary | ICD-10-CM | POA: Diagnosis not present

## 2016-03-16 DIAGNOSIS — Z7983 Long term (current) use of bisphosphonates: Secondary | ICD-10-CM | POA: Diagnosis not present

## 2016-03-16 DIAGNOSIS — Z79899 Other long term (current) drug therapy: Secondary | ICD-10-CM | POA: Diagnosis not present

## 2016-03-16 DIAGNOSIS — Z171 Estrogen receptor negative status [ER-]: Secondary | ICD-10-CM | POA: Insufficient documentation

## 2016-03-16 DIAGNOSIS — Z882 Allergy status to sulfonamides status: Secondary | ICD-10-CM | POA: Insufficient documentation

## 2016-03-16 DIAGNOSIS — Z923 Personal history of irradiation: Secondary | ICD-10-CM | POA: Diagnosis not present

## 2016-03-16 DIAGNOSIS — C50912 Malignant neoplasm of unspecified site of left female breast: Secondary | ICD-10-CM | POA: Diagnosis not present

## 2016-03-16 DIAGNOSIS — C50112 Malignant neoplasm of central portion of left female breast: Secondary | ICD-10-CM

## 2016-03-16 MED ORDER — NYSTATIN 100000 UNIT/GM EX POWD: Freq: Three times a day (TID) | CUTANEOUS | 1 refills | Status: DC

## 2016-03-16 NOTE — Progress Notes (Signed)
Radiation Oncology         [phone]) [phone] ________________________________  Name: Jessica Mullins MRN: [id number]  Date: 03/16/2016  DOB: 05/27/1931  Post Treatment Note  CC: Ann Held, DO  Erroll Luna, MD  Diagnosis:   Stage IIA, T1cN1a triple negative invasive ductal carcinoma of the left breast.  Interval Since Last Radiation: 3 weeks   01/21/2016 through 02/17/2016: The patient initially received a dose of 42.5 Gy in 17 fractions to the breast using whole-breast tangent fields. This was delivered using a 3-D conformal technique. The patient then received a boost to the seroma. This delivered an additional 7.5 Gy in 3 fractions using a 3 field photon technique due to the depth of the seroma. The total dose was 50 Gy.  Narrative:  The patient returns today for routine follow-up.  She tolerated radiotherapy well without difficulty or skin breakdown.                              On review of systems, the patient states reports she has had a rash that is itchy under her left breast that started in the last week. She denies any breakdown or blistering, or bleeding from this site. She denies any chest pain, shortness of breath, fevers, or chills. No other complaints are noted.  ALLERGIES:  is allergic to sulfa antibiotics; tape; glucosamine forte [nutritional supplements]; and latex.  Meds: Current Outpatient Prescriptions  Medication Sig Dispense Refill  . alendronate (FOSAMAX) 70 MG tablet TAKE 1 TABLET (70 MG TOTAL) BY MOUTH EVERY SEVEN DAYS. TAKE WITH A FULL GLASS OF WATER AT NOON TIME ON EMPTY STOMACH. 4 tablet 11  . apixaban (ELIQUIS) 5 MG TABS tablet Take 1 tablet (5 mg total) by mouth 2 (two) times daily. 60 tablet 3  . Biotin (BIOTIN 5000) 5 MG CAPS Take 5 mg by mouth daily.     . Calcium Carbonate-Vitamin D (CALTRATE 600+D) 600-400 MG-UNIT per tablet Take 1 tablet by mouth daily.     . Cholecalciferol (VITAMIN D3) 1000 UNITS CAPS Take 1 capsule by mouth daily.        . cycloSPORINE (RESTASIS) 0.05 % ophthalmic emulsion Apply 1 drop to eye 2 (two) times daily.    [name] diltiazem (CARDIZEM CD) 180 MG 24 hr capsule TAKE 1 CAPSULE (180 MG TOTAL) BY MOUTH DAILY. 30 capsule 3  . flecainide (TAMBOCOR) 50 MG tablet Take 50 mg by mouth 2 (two) times daily.    . hyaluronate sodium (RADIAPLEXRX) GEL Apply 1 application topically 2 (two) times daily. 2nd tube given 02/05/16    . loratadine (CLARITIN) 10 MG tablet Take 10 mg by mouth daily.    . non-metallic deodorant Jethro Poling) MISC Apply 1 application topically daily as needed.    . Omega-3 Fatty Acids (FISH OIL PO) Take 1 tablet by mouth daily.     . pravastatin (PRAVACHOL) 40 MG tablet Take 1 tablet (40 mg total) by mouth daily. Repeat labs are due now 90 tablet 0  . Propylene Glycol 0.6 % SOLN Apply 1 drop to eye daily as needed (dry eyes).     . Psyllium (METAMUCIL PO) Take 15 mLs by mouth daily.     . Turmeric 500 MG CAPS Take 1 capsule by mouth daily.     [name] omeprazole (PRILOSEC) 20 MG capsule TAKE 1 CAPSULE (20 MG TOTAL) BY MOUTH DAILY. (Patient not taking: Reported on 03/16/2016) 30 capsule 11  No current facility-administered medications for this encounter.    Facility-Administered Medications Ordered in Other Encounters  Medication Dose Route Frequency Provider Last Rate Last Dose  . sodium chloride 0.9 % injection 10 mL  10 mL Intracatheter PRN Nicholas Lose, MD   10 mL at 09/21/15 1717    Physical Findings:  height is 5' 0.5" (1.537 m) and weight is 150 lb 4.8 oz (68.2 kg). Her oral temperature is 98 F (36.7 C). Her blood pressure is 130/68 and her pulse is 60.  In general this is a well appearing Caucasian female in no acute distress. She's alert and oriented x4 and appropriate throughout the examination. Cardiopulmonary assessment is negative for acute distress and she exhibits normal effort. The left breast reveals post treatment hyperpigmentation, and beneath the breast in the inframammary fold there is  erythematous plaquing, moisture and thick white debris consistent with cutaneous candida. The contour of the breast is stable but is asymmetric from her surgery.  Lab Findings: Lab Results  Component Value Date   WBC 8.0 10/21/2015   HGB 12.7 10/21/2015   HCT 37.9 10/21/2015   MCV 86.1 10/21/2015   PLT 294 10/21/2015     Radiographic Findings: No results found.  Impression/Plan: 1. Stage IIA, T1cN1a triple negative invasive ductal carcinoma of the left breast. The patient has follow up with Dr. Lindi Adie in January 2018. We discussed that we would be happy to see her back if she has any questions or concerns regarding her previous radiotherapy. 2. Survivorship. I encouraged the patient to attend her appointment in survivorship clinic with Mike Craze, NP for follow up as well. 3. Inframammary cutaneous candida. A prescription is e-scribed to her pharmacy for Nystatin 100,000 units/gram cream to be applied TID until her rash resolves, # one 30 Gram tube with 1 refill. She will contact us if her symptoms are not improved in the next 3-4 days.    Carola Rhine, PAC

## 2016-03-16 NOTE — Addendum Note (Signed)
Encounter addended by: Benn Moulder, RN on: 03/16/2016  2:58 PM<BR>    Actions taken: Charge Capture section accepted

## 2016-03-16 NOTE — Progress Notes (Addendum)
Jessica Mullins here with report of increasing red and a "rash" in her inframmary fold which started 1 week ago. Note moisture and redness in the inframmary fold, and ribs(pendulous breast).  She also reports intermittent itching in this region.  Denies any fatigue.  BP (!) 147/64 (BP Location: Right Arm, Patient Position: Sitting, Cuff Size: Normal)   Pulse 60   Temp 98 F (36.7 C) (Oral)   Ht 5' 0.5" (1.537 m)   Wt 150 lb 4.8 oz (68.2 kg)   BMI 28.87 kg/m    BP 130/68 (BP Location: Right Arm, Patient Position: Sitting, Cuff Size: Normal)   Pulse 60   Temp 98 F (36.7 C) (Oral)   Ht 5' 0.5" (1.537 m)   Wt 150 lb 4.8 oz (68.2 kg)   BMI 28.87 kg/m      Wt Readings from Last 3 Encounters:  03/16/16 150 lb 4.8 oz (68.2 kg)  03/01/16 147 lb 9.6 oz (67 kg)  02/12/16 148 lb 9.6 oz (67.4 kg)

## 2016-03-29 ENCOUNTER — Ambulatory Visit: Payer: Self-pay | Admitting: Radiation Oncology

## 2016-04-04 ENCOUNTER — Other Ambulatory Visit: Payer: Self-pay | Admitting: *Deleted

## 2016-05-05 DIAGNOSIS — Z853 Personal history of malignant neoplasm of breast: Secondary | ICD-10-CM | POA: Diagnosis not present

## 2016-05-05 LAB — HM MAMMOGRAPHY

## 2016-05-05 NOTE — Progress Notes (Signed)
CLINIC:  Survivorship   REASON FOR VISIT:  Routine follow-up post-treatment for a recent history of breast cancer.  BRIEF ONCOLOGIC HISTORY:    Cancer of central portion of left female breast ([place])   05/04/2015 Mammogram     left breast distortion , breast density category A , 7 mm by ultrasound at 3:00 position middle depth      05/12/2015 Initial Diagnosis     left breast biopsy: invasive ductal cancer with DCIS , ER 0%, PR 0%, Ki-67 5%, HER-2 negative ratio 0.83      06/11/2015 Surgery    Left lumpectomy ([place]): DCIS, left additional margin: IDC 1.3 cm + DCIS 1/4 LN positive, grade 2, margins negative, LVI present, ER 0%, PR 0%, HER-2 negative ratio 0.60, Ki-67 5%, T1cN1a stage II a, Mammaprint high risk luminal B      07/20/2015 - 09/21/2015 Chemotherapy    Taxotere and Cytoxan adjuvant chemotherapy  4 cycles      07/24/2015 Procedure    Genetic testing: Negative Ashkenazi Founder mutation panel      01/21/2016 - 02/17/2016 Radiation Therapy    Adjuvant radiation therapy Lisbeth Renshaw). Left breast: 42.5 Gy in 17 fractions. Left breast boost: 7.5 Gy in 3 fractions.        INTERVAL HISTORY:  Ms. Ambroise presents to the [facility] today for our initial meeting to review her survivorship care plan detailing her treatment course for breast cancer, as well as monitoring long-term side effects of that treatment, education regarding health maintenance, screening, and overall wellness and health promotion.     Overall, Ms. Melikian reports feeling quite well since completing her radiation therapy approximately 2.5 months ago.  She Endorses peripheral neuropathy to her fingers and toes, which is intermittent. She is not interested in any medications for this right now. She is struggling with constipation, which she thinks is secondary to some of her other medications. She has tried Metamucil and prunes, which have not been helpful.  She tells me that she had her first  post-treatment mammogram yesterday, 05/05/16, at [facility]. She was told that everything looked great. She is planning to transfer her surgical follow-up from Dr. Brantley Stage to Dr. Genia Hotter at [facility].  She tells me she has an appointment to see Dr. Genia Hotter next week.  She does have a history of melanoma. She tells me she sees her dermatologist annually.  She plans to see her cardiologist next week; she was diagnosed with A-fib during chemotherapy.  She denies any recent heart palpitations, dizzy spells, or syncope.    REVIEW OF SYSTEMS:  Review of Systems  Constitutional: Negative.   HENT:  Negative.   Eyes: Negative.   Respiratory: Negative.   Cardiovascular: Negative.   Gastrointestinal: Negative.   Neurological: Positive for numbness.       Intermittent peripheral neuropathy to fingers and toes  Breast: Denies any new nodularity, masses, tenderness, nipple changes, or nipple discharge.    A 14-point review of systems was completed and was negative, except as noted above.   ONCOLOGY TREATMENT TEAM:  1. Surgeon:  Dr. Brantley Stage at [facility]; she is transferring her care to Dr. Natividad Brood at [facility].  2. Medical Oncologist: Dr. Lindi Adie 3. Radiation Oncologist: Dr. Lisbeth Renshaw    PAST MEDICAL/SURGICAL HISTORY:  Past Medical History:  Diagnosis Date  . Allergy   . Ankle fracture, left   . Breast cancer ([place]) 1115/16   left   . Bursitis of  right shoulder   . Cataract   . Family history of cancer   . Fracture of right wrist   . GERD (gastroesophageal reflux disease)   . Hyperlipidemia   . Hypertension   . Skin cancer 2000   melanoma and basal cell  . Vaso vagal episode    during preparation for colonoscopy   Past Surgical History:  Procedure Laterality Date  . ABDOMINAL HYSTERECTOMY  1988   TAH/BSO--FIBROIDS  . APPENDECTOMY    . BREAST LUMPECTOMY     B/L--FCS  . BREAST LUMPECTOMY WITH RADIOACTIVE SEED AND SENTINEL  LYMPH NODE BIOPSY Left 06/11/2015   Procedure: LEFT BREAST LUMPECTOMY WITH RADIOACTIVE SEED AND LEFT SENTINEL LYMPH NODE MAPPING;  Surgeon: Erroll Luna, MD;  Location: [facility];  Service: General;  Laterality: Left;  . CATARACT EXTRACTION  2015  . COLONOSCOPY  2010  . DG  BONE DENSITY ([place] HX)    . EYE SURGERY Bilateral    cataracts  . fiberadenoma Bilateral 1978, 1980  . GANGLION CYST EXCISION     L  hand  . Lipiflow procedure       ALLERGIES:  Allergies  Allergen Reactions  . Sulfa Antibiotics Other (See Comments)    As a child, rigid as a stick and not responsive  . Tape Other (See Comments)    Blisters, Please use "paper" tape only for short periods of time  . Glucosamine Forte [Nutritional Supplements] Rash  . Latex Rash     CURRENT MEDICATIONS:  Outpatient Encounter Prescriptions as of 05/06/2016  Medication Sig Note  . alendronate (FOSAMAX) 70 MG tablet TAKE 1 TABLET (70 MG TOTAL) BY MOUTH EVERY SEVEN DAYS. TAKE WITH A FULL GLASS OF WATER AT NOON TIME ON EMPTY STOMACH. 08/13/2015: Sundays  . apixaban (ELIQUIS) 5 MG TABS tablet Take 1 tablet (5 mg total) by mouth 2 (two) times daily.   . Biotin (BIOTIN 5000) 5 MG CAPS Take 5 mg by mouth daily.    . Calcium Carbonate-Vitamin D (CALTRATE 600+D) 600-400 MG-UNIT per tablet Take 1 tablet by mouth daily.    . Cholecalciferol (VITAMIN D3) 1000 UNITS CAPS Take 1 capsule by mouth daily.     . cycloSPORINE (RESTASIS) 0.05 % ophthalmic emulsion Apply 1 drop to eye 2 (two) times daily. 11/06/2015: Received from: [place]: Apply to eye.  . diltiazem (CARDIZEM CD) 180 MG 24 hr capsule TAKE 1 CAPSULE (180 MG TOTAL) BY MOUTH DAILY.   . flecainide (TAMBOCOR) 50 MG tablet Take 50 mg by mouth 2 (two) times daily.   . hyaluronate sodium (RADIAPLEXRX) GEL Apply 1 application topically 2 (two) times daily. 2nd tube given 02/05/16   . loratadine (CLARITIN) 10 MG tablet Take 10 mg by mouth daily.   . non-metallic  deodorant Jethro Poling) MISC Apply 1 application topically daily as needed.   . nystatin (MYCOSTATIN/NYSTOP) powder Apply topically 3 (three) times daily.   . Omega-3 Fatty Acids (FISH OIL PO) Take 1 tablet by mouth daily.    [name] omeprazole (PRILOSEC) 20 MG capsule TAKE 1 CAPSULE (20 MG TOTAL) BY MOUTH DAILY. (Patient not taking: Reported on 03/16/2016)   . pravastatin (PRAVACHOL) 40 MG tablet Take 1 tablet (40 mg total) by mouth daily. Repeat labs are due now   . Propylene Glycol 0.6 % SOLN Apply 1 drop to eye daily as needed (dry eyes).    . Psyllium (METAMUCIL PO) Take 15 mLs by mouth daily.    . Turmeric 500 MG CAPS Take  1 capsule by mouth daily.    . [DISCONTINUED] diltiazem (CARDIZEM CD) 180 MG 24 hr capsule TAKE 1 CAPSULE (180 MG TOTAL) BY MOUTH DAILY.   . [DISCONTINUED] pravastatin (PRAVACHOL) 40 MG tablet Take 1 tablet (40 mg total) by mouth daily. Repeat labs are due now    Facility-Administered Encounter Medications as of 05/06/2016  Medication  . sodium chloride 0.9 % injection 10 mL     ONCOLOGIC FAMILY HISTORY:  Family History  Problem Relation Age of Onset  . Lung cancer    . Thrombosis      thromboembolism clotting disorder--? father died of ? clot   . Cancer Mother     mets to bone--? primary  . Coronary artery disease Brother     died of M! @ 68  . Diabetes Brother   . Hyperlipidemia Brother   . Hypertension Brother   . Heart disease Brother     chf  . Heart disease Father 47    MI  . Lung cancer Sister 71    former smoker  . Diabetes Maternal Aunt      GENETIC COUNSELING/TESTING: 07/24/15: Genetic testing: Negative Ashkenazi Founder mutation panel   SOCIAL HISTORY:  Shlonda L Sobel is widowed and lives alone with her cat in [place], [place]. She is a retired Pharmacist, hospital.  She denies any current tobacco or illicit drug use.  She drinks alcohol occasionally.    PHYSICAL EXAMINATION:  Vital Signs:   Vitals:   05/06/16 1247  BP: (!) 150/74  Pulse: 63  Resp: 18    Temp: 98.4 F (36.9 C)   Filed Weights   05/06/16 1247  Weight: 153 lb 9.6 oz (69.7 kg)   General: Well-nourished, well-appearing female in no acute distress.  She is unaccompanied today.   HEENT: Head is normocephalic.  Pupils equal and reactive to light. Conjunctivae clear without exudate.  Sclerae anicteric. Oral mucosa is pink, moist.  Oropharynx is pink without lesions or erythema.  Lymph: No cervical, supraclavicular, or infraclavicular lymphadenopathy noted on palpation.  Cardiovascular: Regular rate and rhythm.[name] Respiratory: Clear to auscultation bilaterally. Chest expansion symmetric; breathing non-labored.  GI: Abdomen soft and round; non-tender, non-distended. Bowel sounds normoactive.  GU: Deferred.  Neuro: No focal deficits. Steady gait.  Psych: Mood and affect normal and appropriate for situation.  Extremities: No edema. Skin: Warm and dry.  LABORATORY DATA:  None for this visit.  DIAGNOSTIC IMAGING:  Recent mammogram: 05/05/16 at [facility] (copy given to Dr. Geralyn Flash nurse and copy sent to HIM to be scanned into patient record)        ASSESSMENT AND PLAN:  Ms.. Haney is a pleasant 82 y.o. female with Stage IIA left breast invasive ductal carcinoma, ER-/PR-/HER2-, diagnosed in 04/2015;  treated with lumpectomy, adjuvant chemotherapy with Taxotere/Cytoxan x 4 cycles, last chemo 09/21/15. She went on to have adjuvant radiation therapy which completed on 02/17/16. She presents to the Survivorship Clinic for our initial meeting and routine follow-up post-completion of treatment for breast cancer.    1. Stage IIA left breast cancer:  Ms. Dinger is continuing to recover from definitive treatment for breast cancer.  She actually had mammogram done at [facility] yesterday (05/05/16) and it was negative. She understands she will need mammograms annually at [facility] going forward.  She will follow-up with her medical oncologist, Dr. Lindi Adie in 06/2016 with history and physical exam per  surveillance protocol.  Today, a comprehensive survivorship care plan and treatment summary was reviewed with the patient today detailing her  breast cancer diagnosis, treatment course, potential late/long-term effects of treatment, appropriate follow-up care with recommendations for the future, and patient education resources.  A copy of this summary, along with a letter will be sent to the patient's primary care provider via mail/fax/In Basket message after today's visit.    2. History of melanoma: Her melanoma was reportedly treated with surgical resection alone.  Recommended she maintain follow-up with her dermatologist at time intervals they recommend (likely annually at this point).   3. Constipation: Her constipation is likely not related to her history of breast cancer. However, we discussed different OTC strategies to manage her bowel concerns.  I recommended that she try either Colace or Senokot-S twice daily, along with Miralax to help manage and prevent constipation.  I wrote the names of these medications down for her and recommended that she titrate the doses as needed, with the goal of having a bowel movement daily or every other day. She will give these a try and let us or her PCP know if she has any problems.   4. Bone health:  Given Ms. Mathews's age & history of breast cancer, she is at risk for bone demineralization.  Her last DEXA scan was 10/03/12, and showed osteopenia.  Given that she will not require any antiestrogen therapy that may further weaken her bones, I will defer to her PCP for additional bone mineral density testing, as clinically indicated.  In the meantime, she was encouraged to increase her consumption of foods rich in calcium, as well as increase her weight-bearing activities.  She was given education on specific activities to promote bone health.  5. Cancer screening:  Due to Ms. Allington's history and her age, she should receive screening for skin cancers, colon  cancer, and gynecologic cancers.  The information and recommendations are listed on the patient's comprehensive care plan/treatment summary and were reviewed in detail with the patient.    6. Health maintenance and wellness promotion: Ms. Prioleau was encouraged to consume 5-7 servings of fruits and vegetables per day. We reviewed the "Nutrition Rainbow" handout, as well as the handout "Take Control of Your Health and Reduce Your Cancer Risk" from the [place].  She was also encouraged to engage in moderate to vigorous exercise for 30 minutes per day most days of the week. We discussed the [employer] fitness program, which is designed for cancer survivors to help them become more physically fit after cancer treatments.  She was instructed to limit her alcohol consumption and continue to abstain from tobacco use.   7. Support services/counseling: It is not uncommon for this period of the patient's cancer care trajectory to be one of many emotions and stressors.  We discussed an opportunity for her to participate in the next session of [facility] ("Finding Your New Normal") support group series designed for patients after they have completed treatment.   Ms. Vancamp was encouraged to take advantage of our many other support services programs, support groups, and/or counseling in coping with her new life as a cancer survivor after completing anti-cancer treatment.  She was offered support today through active listening and expressive supportive counseling.  She was given information regarding our available services and encouraged to contact me with any questions or for help enrolling in any of our support group/programs.    Dispo:   -Return to cancer center to see Dr. Lindi Adie in 06/2016. -She is welcome to return back to the Survivorship Clinic at any time; no additional follow-up needed  at this time.  -Consider referral back to survivorship as a long-term survivor for continued  surveillance  A total of 50 minutes of face-to-face time was spent with this patient with greater than 50% of that time in counseling and care-coordination.   Mike Craze, NP Survivorship Program [facility] [phone]   Note: PRIMARY CARE PROVIDER Rosalita Chessman [place], [place] [phone]

## 2016-05-06 ENCOUNTER — Ambulatory Visit (HOSPITAL_BASED_OUTPATIENT_CLINIC_OR_DEPARTMENT_OTHER): Payer: Medicare PPO | Admitting: Adult Health

## 2016-05-06 ENCOUNTER — Encounter: Payer: Self-pay | Admitting: Adult Health

## 2016-05-06 VITALS — BP 150/74 | HR 63 | Temp 98.4°F | Resp 18 | Ht 60.5 in | Wt 153.6 lb

## 2016-05-06 DIAGNOSIS — Z85820 Personal history of malignant melanoma of skin: Secondary | ICD-10-CM

## 2016-05-06 DIAGNOSIS — Z171 Estrogen receptor negative status [ER-]: Principal | ICD-10-CM

## 2016-05-06 DIAGNOSIS — Z853 Personal history of malignant neoplasm of breast: Secondary | ICD-10-CM

## 2016-05-06 DIAGNOSIS — C50112 Malignant neoplasm of central portion of left female breast: Secondary | ICD-10-CM

## 2016-05-10 DIAGNOSIS — C50512 Malignant neoplasm of lower-outer quadrant of left female breast: Secondary | ICD-10-CM | POA: Diagnosis not present

## 2016-05-10 DIAGNOSIS — Z171 Estrogen receptor negative status [ER-]: Secondary | ICD-10-CM | POA: Diagnosis not present

## 2016-05-23 ENCOUNTER — Ambulatory Visit (HOSPITAL_COMMUNITY)
Admission: RE | Admit: 2016-05-23 | Discharge: 2016-05-23 | Disposition: A | Discharge status: Home / Self Care | Payer: Medicare PPO | Source: Ambulatory Visit | Attending: Internal Medicine | Admitting: Internal Medicine

## 2016-05-23 VITALS — BP 136/87 | HR 97 | Wt 152.8 lb

## 2016-05-23 DIAGNOSIS — Z9221 Personal history of antineoplastic chemotherapy: Secondary | ICD-10-CM | POA: Diagnosis not present

## 2016-05-23 DIAGNOSIS — E785 Hyperlipidemia, unspecified: Secondary | ICD-10-CM | POA: Diagnosis not present

## 2016-05-23 DIAGNOSIS — C50112 Malignant neoplasm of central portion of left female breast: Secondary | ICD-10-CM

## 2016-05-23 DIAGNOSIS — K219 Gastro-esophageal reflux disease without esophagitis: Secondary | ICD-10-CM | POA: Insufficient documentation

## 2016-05-23 DIAGNOSIS — Z8249 Family history of ischemic heart disease and other diseases of the circulatory system: Secondary | ICD-10-CM | POA: Insufficient documentation

## 2016-05-23 DIAGNOSIS — K5900 Constipation, unspecified: Secondary | ICD-10-CM | POA: Insufficient documentation

## 2016-05-23 DIAGNOSIS — Z91040 Latex allergy status: Secondary | ICD-10-CM | POA: Diagnosis not present

## 2016-05-23 DIAGNOSIS — I10 Essential (primary) hypertension: Secondary | ICD-10-CM | POA: Diagnosis not present

## 2016-05-23 DIAGNOSIS — Z833 Family history of diabetes mellitus: Secondary | ICD-10-CM | POA: Insufficient documentation

## 2016-05-23 DIAGNOSIS — Z9012 Acquired absence of left breast and nipple: Secondary | ICD-10-CM | POA: Diagnosis not present

## 2016-05-23 DIAGNOSIS — Z9109 Other allergy status, other than to drugs and biological substances: Secondary | ICD-10-CM | POA: Insufficient documentation

## 2016-05-23 DIAGNOSIS — C50919 Malignant neoplasm of unspecified site of unspecified female breast: Secondary | ICD-10-CM | POA: Insufficient documentation

## 2016-05-23 DIAGNOSIS — Z809 Family history of malignant neoplasm, unspecified: Secondary | ICD-10-CM | POA: Insufficient documentation

## 2016-05-23 DIAGNOSIS — Z801 Family history of malignant neoplasm of trachea, bronchus and lung: Secondary | ICD-10-CM | POA: Diagnosis not present

## 2016-05-23 DIAGNOSIS — Z171 Estrogen receptor negative status [ER-]: Secondary | ICD-10-CM | POA: Diagnosis not present

## 2016-05-23 DIAGNOSIS — Z808 Family history of malignant neoplasm of other organs or systems: Secondary | ICD-10-CM | POA: Insufficient documentation

## 2016-05-23 DIAGNOSIS — Z923 Personal history of irradiation: Secondary | ICD-10-CM | POA: Insufficient documentation

## 2016-05-23 DIAGNOSIS — I480 Paroxysmal atrial fibrillation: Secondary | ICD-10-CM | POA: Insufficient documentation

## 2016-05-23 DIAGNOSIS — Z85820 Personal history of malignant melanoma of skin: Secondary | ICD-10-CM | POA: Diagnosis not present

## 2016-05-23 DIAGNOSIS — Z91018 Allergy to other foods: Secondary | ICD-10-CM | POA: Diagnosis not present

## 2016-05-23 DIAGNOSIS — Z882 Allergy status to sulfonamides status: Secondary | ICD-10-CM | POA: Insufficient documentation

## 2016-05-23 NOTE — Patient Instructions (Signed)
No changes in medication today.  No lab work today.  Will have CHF Clinical Pharmacist Ileene Patrick call you to follow up on questions related to medications and constipation.  Follow up 6-8 months with Dr. Haroldine Laws. We will call you closer to this time, or you may call our office to schedule 1 month before you are due to be seen.  Do the following things EVERYDAY: 1) Weigh yourself in the morning before breakfast. Write it down and keep it in a log. 2) Take your medicines as prescribed 3) Eat low salt foods-Limit salt (sodium) to 2000 mg per day.  4) Stay as active as you can everyday 5) Limit all fluids for the day to less than 2 liters

## 2016-05-23 NOTE — Progress Notes (Signed)
Patient ID: Talecia L Renninger, female   DOB: [birth year], 82 y.o.   MRN: [id number]    Advanced Heart Failure Clinic Note   Referring Physician: Lindi Adie Primary Care: [facility] Primary Cardiologist: Dr. Haroldine Laws   HPI:  Shantoya is a an 82 y.o woman with h/o HTN, HL, PAF and left breast CA.   Diagnosed with breast CA in 11/17. Underwent lumpectomy in 12/16. Had 2/4 rounds of chemo so far with taxotere and Cytoxan last dose Monday.  January 2017 found to have AF with RVR. Started on Eliquis. Echo normal.   We arranged for TEE/DC-CV on 08/14/15 but when she arrived for procedure she was in NSR. Given high risk of recurrence we started flecainide 50 bid. She had treadmill test to exclude proarrhythmia. Baseline ECG with NSR 73. QRS and PR intervlas normal. No arrhythmias. Nonspecific ST changes with exercise.   Patient presents today for regular follow up.  Finished her radiation in August. Had yearly mammogram check couple of weeks ago and everything "was fine".  Occasionally is "slow going" in the mornings and has mild SOB, but improves through the day. Denies DOE. No Orthopnea or Bendopnea.  Having some constipation. No palpitations. No bleeding on eliquis.  Does bruise easily or have worse bleeding than normal when she has a small cut. No peripheral.    Past Medical History:  Diagnosis Date  . Allergy   . Ankle fracture, left   . Breast cancer ([place]) 1115/16   left   . Bursitis of right shoulder   . Cataract   . Family history of cancer   . Fracture of right wrist   . GERD (gastroesophageal reflux disease)   . Hyperlipidemia   . Hypertension   . Skin cancer 2000   melanoma and basal cell  . Vaso vagal episode    during preparation for colonoscopy    Current Outpatient Prescriptions  Medication Sig Dispense Refill  . alendronate (FOSAMAX) 70 MG tablet TAKE 1 TABLET (70 MG TOTAL) BY MOUTH EVERY SEVEN DAYS. TAKE WITH A FULL GLASS OF WATER AT NOON TIME ON EMPTY STOMACH. 4 tablet 11  .  apixaban (ELIQUIS) 5 MG TABS tablet Take 1 tablet (5 mg total) by mouth 2 (two) times daily. 60 tablet 3  . Biotin (BIOTIN 5000) 5 MG CAPS Take 5 mg by mouth daily.     . Calcium Carbonate-Vitamin D (CALTRATE 600+D) 600-400 MG-UNIT per tablet Take 1 tablet by mouth daily.     . Cholecalciferol (VITAMIN D3) 1000 UNITS CAPS Take 1 capsule by mouth daily.      . cycloSPORINE (RESTASIS) 0.05 % ophthalmic emulsion Apply 1 drop to eye 2 (two) times daily.    [name] diltiazem (CARDIZEM CD) 180 MG 24 hr capsule TAKE 1 CAPSULE (180 MG TOTAL) BY MOUTH DAILY. 30 capsule 3  . flecainide (TAMBOCOR) 50 MG tablet Take 50 mg by mouth 2 (two) times daily.    [name] loratadine (CLARITIN) 10 MG tablet Take 10 mg by mouth daily.    . non-metallic deodorant Jethro Poling) MISC Apply 1 application topically daily as needed.    . nystatin (MYCOSTATIN/NYSTOP) powder Apply topically 3 (three) times daily. 30 g 1  . Omega-3 Fatty Acids (FISH OIL PO) Take 1 tablet by mouth daily.     . pravastatin (PRAVACHOL) 40 MG tablet Take 1 tablet (40 mg total) by mouth daily. Repeat labs are due now 90 tablet 0  . Propylene Glycol 0.6 % SOLN Apply 1 drop to eye daily as  needed (dry eyes).     . Psyllium (METAMUCIL PO) Take 15 mLs by mouth daily.     . Turmeric 500 MG CAPS Take 1 capsule by mouth daily.      No current facility-administered medications for this encounter.    Facility-Administered Medications Ordered in Other Encounters  Medication Dose Route Frequency Provider Last Rate Last Dose  . sodium chloride 0.9 % injection 10 mL  10 mL Intracatheter PRN Nicholas Lose, MD   10 mL at 09/21/15 1717    Allergies  Allergen Reactions  . Sulfa Antibiotics Other (See Comments)    As a child, rigid as a stick and not responsive  . Tape Other (See Comments)    Blisters, Please use "paper" tape only for short periods of time  . Glucosamine Forte [Nutritional Supplements] Rash  . Latex Rash      Social History   Social History  . Marital  status: Widowed    Spouse name: N/A  . Number of children: N/A  . Years of education: N/A   Occupational History  . teacher Retired    retired   Social History Main Topics  . Smoking status: Never Smoker  . Smokeless tobacco: Never Used  . Alcohol use No  . Drug use: No  . Sexual activity: No   Other Topics Concern  . Not on file   Social History Narrative  . No narrative on file      Family History  Problem Relation Age of Onset  . Lung cancer    . Thrombosis      thromboembolism clotting disorder--? father died of ? clot   . Cancer Mother     mets to bone--? primary  . Coronary artery disease Brother     died of M! @ 81  . Diabetes Brother   . Hyperlipidemia Brother   . Hypertension Brother   . Heart disease Brother     chf  . Heart disease Father 23    MI  . Lung cancer Sister 22    former smoker  . Diabetes Maternal Aunt     Vitals:   05/23/16 1000  BP: 136/87  Pulse: 97  SpO2: 97%  Weight: 152 lb 12.8 oz (69.3 kg)    PHYSICAL EXAM: General:  Elderly.  NAD HEENT: normal Neck: supple. no JVD. Carotids 2+ bilat; no bruits. No lymphadenopathy or thryomegaly appreciated. Cor: PMI nondisplaced. RRR  No rubs, gallops or murmurs. Lungs: clear Abdomen: soft, NT, ND, no HSM. No bruits or masses. +BS  Extremities: no cyanosis, clubbing, rash, edema Neuro: alert & oriented x 3, cranial nerves grossly intact. moves all 4 extremities w/o difficulty. Affect pleasant   ASSESSMENT & PLAN: 1. PAF with RVR - CHADSVASC 4 (age (4), HTN, female)  - Echo 07/2015 LVEF 60-65%, DD 1%, PA peak pressure 32 mm Hg, mild LAE.  - EKG shows NSR.  - Maintaining NSR on flecainide. Intervals normal. No arrhythmia on stress test. Continue flecainide, diltiazem and Eliquis 2. Breast CA with non-healing mastectomy scar - Has finished radiation.  - Follows with Dr. Lisbeth Renshaw.  Shirley Friar, PA-C 10:01 AM

## 2016-05-26 DIAGNOSIS — H353111 Nonexudative age-related macular degeneration, right eye, early dry stage: Secondary | ICD-10-CM | POA: Diagnosis not present

## 2016-05-26 DIAGNOSIS — H40013 Open angle with borderline findings, low risk, bilateral: Secondary | ICD-10-CM | POA: Diagnosis not present

## 2016-05-26 DIAGNOSIS — H353122 Nonexudative age-related macular degeneration, left eye, intermediate dry stage: Secondary | ICD-10-CM | POA: Diagnosis not present

## 2016-05-26 DIAGNOSIS — H16223 Keratoconjunctivitis sicca, not specified as Sjogren's, bilateral: Secondary | ICD-10-CM | POA: Diagnosis not present

## 2016-06-02 ENCOUNTER — Other Ambulatory Visit: Payer: Self-pay | Admitting: Family Medicine

## 2016-06-02 DIAGNOSIS — E785 Hyperlipidemia, unspecified: Secondary | ICD-10-CM

## 2016-06-15 ENCOUNTER — Ambulatory Visit: Payer: Medicare PPO | Admitting: Medical

## 2016-06-16 ENCOUNTER — Other Ambulatory Visit: Payer: Self-pay

## 2016-06-16 ENCOUNTER — Telehealth: Payer: Self-pay

## 2016-06-16 ENCOUNTER — Encounter: Payer: Self-pay | Admitting: *Deleted

## 2016-06-16 DIAGNOSIS — I4819 Other persistent atrial fibrillation: Secondary | ICD-10-CM

## 2016-06-16 MED ORDER — APIXABAN 5 MG PO TABS: 5.0000 mg | ORAL_TABLET | Freq: Two times a day (BID) | ORAL | 3 refills | Status: DC

## 2016-06-16 NOTE — Telephone Encounter (Signed)
Received a call from pt regarding eloquis refill. Called pt back to let her know that it should be filled at the listed [facility]. Call back number provided for additional questions.

## 2016-06-17 ENCOUNTER — Other Ambulatory Visit: Payer: Self-pay | Admitting: *Deleted

## 2016-06-24 ENCOUNTER — Ambulatory Visit: Payer: Medicare PPO | Admitting: Family Medicine

## 2016-06-28 ENCOUNTER — Other Ambulatory Visit: Payer: Self-pay | Admitting: Radiation Oncology

## 2016-06-28 ENCOUNTER — Other Ambulatory Visit: Payer: Self-pay | Admitting: Family Medicine

## 2016-07-08 ENCOUNTER — Ambulatory Visit: Payer: Medicare PPO | Admitting: Family Medicine

## 2016-07-16 DIAGNOSIS — S61411A Laceration without foreign body of right hand, initial encounter: Secondary | ICD-10-CM | POA: Diagnosis not present

## 2016-07-19 NOTE — Assessment & Plan Note (Signed)
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 Adjuvant chemotherapy completed 09/21/2015 Taxotere and Cytoxan 4 cycles Adjuvant radiation delayed due to wound healing issues (will be starting soon)  Breast Cancer Surveillance: 1. Breast exam 07/20/16: Normal 2. Mammogram 05/05/16 No abnormalities. Postsurgical changes. Breast Density Category C. I recommended that she get 3-D mammograms for surveillance. Discussed the differences between different breast density categories.    Prior history of melanoma  Return to clinic in 6 months for follow-up and surveillance.

## 2016-07-20 ENCOUNTER — Encounter: Payer: Self-pay | Admitting: Hematology and Oncology

## 2016-07-20 ENCOUNTER — Ambulatory Visit (HOSPITAL_BASED_OUTPATIENT_CLINIC_OR_DEPARTMENT_OTHER): Payer: Medicare PPO | Admitting: Hematology and Oncology

## 2016-07-20 VITALS — BP 143/68 | HR 66 | Temp 97.9°F | Resp 18 | Wt 157.8 lb

## 2016-07-20 DIAGNOSIS — I4891 Unspecified atrial fibrillation: Secondary | ICD-10-CM

## 2016-07-20 DIAGNOSIS — Z853 Personal history of malignant neoplasm of breast: Secondary | ICD-10-CM

## 2016-07-20 DIAGNOSIS — C439 Malignant melanoma of skin, unspecified: Secondary | ICD-10-CM

## 2016-07-20 DIAGNOSIS — C50112 Malignant neoplasm of central portion of left female breast: Secondary | ICD-10-CM

## 2016-07-20 DIAGNOSIS — Z7901 Long term (current) use of anticoagulants: Secondary | ICD-10-CM

## 2016-07-20 DIAGNOSIS — Z171 Estrogen receptor negative status [ER-]: Secondary | ICD-10-CM

## 2016-07-20 NOTE — Progress Notes (Signed)
Patient Care Team: Ann Held, DO as PCP - General Nicholas Lose, MD as Consulting Physician (Hematology and Oncology) Erroll Luna, MD as Consulting Physician (General Surgery)  DIAGNOSIS:  Encounter Diagnoses  Name Primary?  . Melanoma of skin ([place]) Yes  . Malignant neoplasm of central portion of left breast in female, estrogen receptor negative ([place])     SUMMARY OF ONCOLOGIC HISTORY:   Cancer of central portion of left female breast ([place])   05/04/2015 Mammogram     left breast distortion , breast density category A , 7 mm by ultrasound at 3:00 position middle depth      05/12/2015 Initial Diagnosis     left breast biopsy: invasive ductal cancer with DCIS , ER 0%, PR 0%, Ki-67 5%, HER-2 negative ratio 0.83      06/11/2015 Surgery    Left lumpectomy ([facility]): DCIS, left additional margin: IDC 1.3 cm + DCIS 1/4 LN positive, grade 2, margins negative, LVI present, ER 0%, PR 0%, HER-2 negative ratio 0.60, Ki-67 5%, T1cN1a stage II a, Mammaprint high risk luminal B      07/20/2015 - 09/21/2015 Chemotherapy    Taxotere and Cytoxan adjuvant chemotherapy  4 cycles      07/24/2015 Procedure    Genetic testing: Negative [employer]ken[employer] Founder mutation panel      01/21/2016 - 02/17/2016 Radiation Therapy    Adjuvant radiation therapy Lisbeth Renshaw). Left breast: 42.5 Gy in 17 fractions. Left breast boost: 7.5 Gy in 3 fractions.        CHIEF COMPLIANT: Surveillance of breast cancer  INTERVAL HISTORY: Jakia L Schwanz is a 83 year old with above-mentioned history of left breast cancer treated with lumpectomy and adjuvant chemotherapy and is currently on surveillance. She completed adjuvant radiation therapy. She is currently on Coumadin therapy and tends to bleed whenever she has a trauma. Recently she had the skin of her right dorsum tear which bled a lot and she had to go to the urgent care. She denies any lumps or nodules in the breasts.  REVIEW OF SYSTEMS:   Constitutional:  Denies fevers, chills or abnormal weight loss Eyes: Denies blurriness of vision Ears, nose, mouth, throat, and face: Denies mucositis or sore throat Respiratory: Denies cough, dyspnea or wheezes Cardiovascular: Denies palpitation, chest discomfort Gastrointestinal:  Denies nausea, heartburn or change in bowel habits Skin: Denies abnormal skin rashes, bruises on the right dorsum hand Lymphatics: Denies new lymphadenopathy or easy bruising Neurological:Denies numbness, tingling or new weaknesses Behavioral/Psych: Mood is stable, no new changes  Extremities: No lower extremity edema Breast:  denies any pain or lumps or nodules in either breasts All other systems were reviewed with the patient and are negative.  I have reviewed the past medical history, past surgical history, social history and family history with the patient and they are unchanged from previous note.  ALLERGIES:  is allergic to sulfa antibiotics; tape; glucosamine forte [nutritional supplements]; and latex.  MEDICATIONS:  Current Outpatient Prescriptions  Medication Sig Dispense Refill  . alendronate (FOSAMAX) 70 MG tablet TAKE 1 TABLET (70 MG TOTAL) BY MOUTH EVERY SEVEN DAYS. TAKE WITH A FULL GLASS OF WATER AT NOON TIME ON EMPTY STOMACH. 4 tablet 0  . apixaban (ELIQUIS) 5 MG TABS tablet Take 1 tablet (5 mg total) by mouth 2 (two) times daily. 60 tablet 3  . Biotin (BIOTIN 5000) 5 MG CAPS Take 5 mg by mouth daily.     . Calcium Carbonate-Vitamin D (CALTRATE 600+D) 600-400 MG-UNIT per tablet Take 1 tablet  by mouth daily.     . Cholecalciferol (VITAMIN D3) 1000 UNITS CAPS Take 1 capsule by mouth daily.      . cycloSPORINE (RESTASIS) 0.05 % ophthalmic emulsion Apply 1 drop to eye 2 (two) times daily.    [name] diltiazem (CARDIZEM CD) 180 MG 24 hr capsule TAKE 1 CAPSULE (180 MG TOTAL) BY MOUTH DAILY. 30 capsule 3  . docusate sodium (COLACE) 100 MG capsule Take 100 mg by mouth 2 (two) times daily as needed for mild constipation.    .  flecainide (TAMBOCOR) 100 MG tablet Take 50 mg by mouth 2 (two) times daily.    [name] loratadine (CLARITIN) 10 MG tablet Take 10 mg by mouth daily.    [name] nystatin (MYCOSTATIN/NYSTOP) powder Apply topically 3 (three) times daily. 30 g 1  . Omega-3 Fatty Acids (FISH OIL PO) Take 1 tablet by mouth daily.     . pravastatin (PRAVACHOL) 40 MG tablet TAKE 1 TABLET (40 MG TOTAL) BY MOUTH DAILY. REPEAT LABS ARE DUE NOW 90 tablet 1  . Propylene Glycol 0.6 % SOLN Apply 1 drop to eye daily as needed (dry eyes).     . Psyllium (METAMUCIL PO) Take 15 mLs by mouth daily.     . Turmeric 500 MG CAPS Take 1 capsule by mouth daily.      No current facility-administered medications for this visit.    Facility-Administered Medications Ordered in Other Visits  Medication Dose Route Frequency Provider Last Rate Last Dose  . sodium chloride 0.9 % injection 10 mL  10 mL Intracatheter PRN Nicholas Lose, MD   10 mL at 09/21/15 1717    PHYSICAL EXAMINATION: ECOG PERFORMANCE STATUS: 1 - Symptomatic but completely ambulatory  Vitals:   07/20/16 1344  BP: (!) 143/68  Pulse: 66  Resp: 18  Temp: 97.9 F (36.6 C)   Filed Weights   07/20/16 1344  Weight: 157 lb 12.8 oz (71.6 kg)    GENERAL:alert, no distress and comfortable SKIN: skin color, texture, turgor are normal, no rashes or significant lesions EYES: normal, Conjunctiva are pink and non-injected, sclera clear OROPHARYNX:no exudate, no erythema and lips, buccal mucosa, and tongue normal  NECK: supple, thyroid normal size, non-tender, without nodularity LYMPH:  no palpable lymphadenopathy in the cervical, axillary or inguinal LUNGS: clear to auscultation and percussion with normal breathing effort HEART: regular rate & rhythm and no murmurs and no lower extremity edema ABDOMEN:abdomen soft, non-tender and normal bowel sounds MUSCULOSKELETAL:no cyanosis of digits and no clubbing  NEURO: alert & oriented x 3 with fluent speech, no focal motor/sensory  deficits EXTREMITIES: No lower extremity edema  LABORATORY DATA:  I have reviewed the data as listed   Chemistry      Component Value Date/Time   NA 139 03/01/2016 1158   NA 140 09/21/2015 1318   K 5.2 (H) 03/01/2016 1158   K 4.8 09/21/2015 1318   CL 104 03/01/2016 1158   CO2 33 (H) 03/01/2016 1158   CO2 30 (H) 09/21/2015 1318   BUN 14 03/01/2016 1158   BUN 9.5 09/21/2015 1318   CREATININE 0.79 03/01/2016 1158   CREATININE 0.8 09/21/2015 1318      Component Value Date/Time   CALCIUM 9.6 03/01/2016 1158   CALCIUM 10.3 09/21/2015 1318   ALKPHOS 61 03/01/2016 1158   ALKPHOS 76 09/21/2015 1318   AST 16 03/01/2016 1158   AST 19 09/21/2015 1318   ALT 17 03/01/2016 1158   ALT 17 09/21/2015 1318   BILITOT 0.4 03/01/2016  1158   BILITOT 0.31 09/21/2015 1318       Lab Results  Component Value Date   WBC 8.0 10/21/2015   HGB 12.7 10/21/2015   HCT 37.9 10/21/2015   MCV 86.1 10/21/2015   PLT 294 10/21/2015   NEUTROABS 5.9 10/21/2015    ASSESSMENT & PLAN:  Cancer of central portion of left female breast ([place]) Left lumpectomy 06/11/2015: DCIS, left additional margin: IDC 1.3 cm + DCIS 1/4 LN positive, grade 2, margins negative, LVI present, ER 0%, PR 0%, HER-2 negative ratio 0.60, Ki-67 5%, T1cN1a stage II a , Mammaprint high risk ( 5 year risk 22%,10 year risk 29%) luminal type B, Distant metastasis free survival at 5 years 88% with chemotherapy. Adjuvant chemotherapy completed 09/21/2015 Taxotere and Cytoxan 4 cycles Adjuvant radiation delayed Completed 02/17/2016  Breast Cancer Surveillance: 1. Breast exam 07/20/16: Scar tissue in the left breast inferior aspect related to prior surgery 2. Mammogram 05/05/16 No abnormalities. Postsurgical changes. Breast Density Category C. I recommended that she get 3-D mammograms for surveillance. Discussed the differences between different breast density categories.  Prior history of atrial fibrillation: Currently on Eliquis, Cardizem  and flecainide. I sent a message to Dr. Haroldine Laws to evaluate whether she needs to remain on these medications. Patient is not keen on taking all of those meds.  Prior history of melanoma  Return to clinic in 1 year for follow-up and surveillance.   I spent 15 minutes talking to the patient of which more than half was spent in counseling and coordination of care.  No orders of the defined types were placed in this encounter.  The patient has a good understanding of the overall plan. she agrees with it. she will call with any problems that may develop before the next visit here.   Rulon Eisenmenger, MD 07/20/16

## 2016-08-11 ENCOUNTER — Other Ambulatory Visit: Payer: Self-pay | Admitting: Family Medicine

## 2016-08-17 ENCOUNTER — Encounter (HOSPITAL_COMMUNITY): Payer: Self-pay

## 2016-08-29 ENCOUNTER — Encounter: Payer: Self-pay | Admitting: Family Medicine

## 2016-08-29 ENCOUNTER — Ambulatory Visit (INDEPENDENT_AMBULATORY_CARE_PROVIDER_SITE_OTHER): Payer: Medicare PPO | Admitting: Family Medicine

## 2016-08-29 VITALS — BP 130/78 | HR 64 | Temp 97.9°F | Resp 16 | Ht 61.0 in | Wt 159.0 lb

## 2016-08-29 DIAGNOSIS — I10 Essential (primary) hypertension: Secondary | ICD-10-CM

## 2016-08-29 DIAGNOSIS — M65312 Trigger thumb, left thumb: Secondary | ICD-10-CM

## 2016-08-29 DIAGNOSIS — W19XXXA Unspecified fall, initial encounter: Secondary | ICD-10-CM | POA: Diagnosis not present

## 2016-08-29 DIAGNOSIS — E785 Hyperlipidemia, unspecified: Secondary | ICD-10-CM | POA: Diagnosis not present

## 2016-08-29 DIAGNOSIS — Z23 Encounter for immunization: Secondary | ICD-10-CM

## 2016-08-29 LAB — CBC
HCT: 44.5 % (ref 36.0–46.0)
Hemoglobin: 15 g/dL (ref 12.0–15.0)
MCHC: 33.6 g/dL (ref 30.0–36.0)
MCV: 91.4 fl (ref 78.0–100.0)
Platelets: 242 10*3/uL (ref 150.0–400.0)
RBC: 4.87 Mil/uL (ref 3.87–5.11)
RDW: 13.9 % (ref 11.5–15.5)
WBC: 8 10*3/uL (ref 4.0–10.5)

## 2016-08-29 LAB — COMPREHENSIVE METABOLIC PANEL
ALT: 17 U/L (ref 0–35)
AST: 17 U/L (ref 0–37)
Albumin: 4.2 g/dL (ref 3.5–5.2)
Alkaline Phosphatase: 62 U/L (ref 39–117)
BUN: 13 mg/dL (ref 6–23)
CO2: 29 mEq/L (ref 19–32)
Calcium: 10 mg/dL (ref 8.4–10.5)
Chloride: 102 mEq/L (ref 96–112)
Creatinine, Ser: 0.72 mg/dL (ref 0.40–1.20)
GFR: 82.08 mL/min (ref >60.00)
Glucose, Bld: 93 mg/dL (ref 70–99)
Potassium: 3.8 mEq/L (ref 3.5–5.1)
Sodium: 141 mEq/L (ref 135–145)
Total Bilirubin: 0.6 mg/dL (ref 0.2–1.2)
Total Protein: 6.8 g/dL (ref 6.0–8.3)

## 2016-08-29 LAB — LIPID PANEL
Cholesterol: 149 mg/dL (ref 0–200)
HDL: 63.2 mg/dL (ref >39.00)
LDL Cholesterol: 69 mg/dL (ref 0–99)
NonHDL: 86.15
Total CHOL/HDL Ratio: 2
Triglycerides: 84 mg/dL (ref 0.0–149.0)
VLDL: 16.8 mg/dL (ref 0.0–40.0)

## 2016-08-29 MED ORDER — PRAVASTATIN SODIUM 40 MG PO TABS: 40.0000 mg | ORAL_TABLET | Freq: Every day | ORAL | 1 refills | Status: DC

## 2016-08-29 MED ORDER — ZOSTER VAC RECOMB ADJUVANTED 50 MCG/0.5ML IM SUSR: 50.0000 ug | Freq: Once | INTRAMUSCULAR | 1 refills | Status: AC

## 2016-08-29 NOTE — Progress Notes (Signed)
I acted as a Education administrator for Dr. Royden Purl, LPN      Subjective:    Patient ID: Jessica Mullins, female    DOB: [birth year], 83 y.o.   MRN: [id number]  Chief Complaint  Patient presents with  . Hypertension    follow up  . Hyperlipidemia    follow up    Hypertension  This is a chronic problem. The current episode started more than 1 year ago. The problem is controlled. Pertinent negatives include no blurred vision, chest pain, headaches or palpitations.  Hyperlipidemia  This is a chronic problem. The current episode started more than 1 year ago. The problem is controlled. Pertinent negatives include no chest pain.    Patient is in today for follow up hypertension, hypertension. She is also c/o inc falling--- even after a fib treated. She denies dizziness, lightheadedness,  No weakness in legs.  Last time she fell she was coming into garage with grocerys and she put bag on counter it knocked her off balance.  ? Whether it is from chemo.  She is also c/o pain in L thumb--- it gets stuck bent.   Patient Care Team: Ann Held, DO as PCP - General Nicholas Lose, MD as Consulting Physician (Hematology and Oncology) Erroll Luna, MD as Consulting Physician (General Surgery)   Past Medical History:  Diagnosis Date  . Allergy   . Ankle fracture, left   . Breast cancer ([place]) 1115/16   left   . Bursitis of right shoulder   . Cataract   . Family history of cancer   . Fracture of right wrist   . GERD (gastroesophageal reflux disease)   . Hyperlipidemia   . Hypertension   . Skin cancer 2000   melanoma and basal cell  . Vaso vagal episode    during preparation for colonoscopy    Past Surgical History:  Procedure Laterality Date  . ABDOMINAL HYSTERECTOMY  1988   TAH/BSO--FIBROIDS  . APPENDECTOMY    . BREAST LUMPECTOMY     B/L--[facility]CS  . BREAST LUMPECTOMY WITH RADIOACTIVE SEED AND SENTINEL LYMPH NODE BIOPSY Left 06/11/2015   Procedure: LEFT BREAST LUMPECTOMY WITH  RADIOACTIVE SEED AND LEFT SENTINEL LYMPH NODE MAPPING;  Surgeon: Erroll Luna, MD;  Location: [facility];  Service: General;  Laterality: Left;  . CATARACT EXTRACTION  2015  . COLONOSCOPY  2010  . DG  BONE DENSITY ([place] [facility])    . EYE SURGERY Bilateral    cataracts  . fiberadenoma Bilateral 1978, 1980  . GANGLION CYST EXCISION     L  hand  . Lipiflow procedure      Family History  Problem Relation Age of Onset  . Cancer Mother     mets to bone--? primary  . Coronary artery disease Brother     died of M! @ 34  . Diabetes Brother   . Hyperlipidemia Brother   . Hypertension Brother   . Heart disease Brother     chf  . Heart disease Father 74    MI  . Lung cancer Sister 34    former smoker  . Diabetes Maternal Aunt   . Lung cancer    . Thrombosis      thromboembolism clotting disorder--? father died of ? clot     Social History   Social History  . Marital status: Widowed    Spouse name: N/A  . Number of children: N/A  . Years of education: N/A   Occupational History  .  teacher Retired    retired   Social History Main Topics  . Smoking status: Never Smoker  . Smokeless tobacco: Never Used  . Alcohol use No  . Drug use: No  . Sexual activity: No   Other Topics Concern  . Not on file   Social History Narrative  . No narrative on file    Outpatient Medications Prior to Visit  Medication Sig Dispense Refill  . alendronate (FOSAMAX) 70 MG tablet TAKE 1 TABLET (70 MG TOTAL) BY MOUTH EVERY SEVEN DAYS. TAKE WITH A FULL GLASS OF WATER AT NOON TIME ON EMPTY STOMACH. 4 tablet 2  . apixaban (ELIQUIS) 5 MG TABS tablet Take 1 tablet (5 mg total) by mouth 2 (two) times daily. 60 tablet 3  . Biotin (BIOTIN 5000) 5 MG CAPS Take 5 mg by mouth daily.     . Calcium Carbonate-Vitamin D (CALTRATE 600+D) 600-400 MG-UNIT per tablet Take 1 tablet by mouth daily.     . Cholecalciferol (VITAMIN D3) 1000 UNITS CAPS Take 1 capsule by mouth daily.      . cycloSPORINE (RESTASIS) 0.05 %  ophthalmic emulsion Apply 1 drop to eye 2 (two) times daily.    [name] diltiazem (CARDIZEM CD) 180 MG 24 hr capsule TAKE 1 CAPSULE (180 MG TOTAL) BY MOUTH DAILY. 30 capsule 3  . docusate sodium (COLACE) 100 MG capsule Take 100 mg by mouth 2 (two) times daily as needed for mild constipation.    . flecainide (TAMBOCOR) 100 MG tablet Take 50 mg by mouth 2 (two) times daily.    [name] loratadine (CLARITIN) 10 MG tablet Take 10 mg by mouth daily.    [name] nystatin (MYCOSTATIN/NYSTOP) powder Apply topically 3 (three) times daily. 30 g 1  . Omega-3 Fatty Acids (FISH OIL PO) Take 1 tablet by mouth daily.     [name] Propylene Glycol 0.6 % SOLN Apply 1 drop to eye daily as needed (dry eyes).     . Psyllium (METAMUCIL PO) Take 15 mLs by mouth daily.     . Turmeric 500 MG CAPS Take 1 capsule by mouth daily.     . pravastatin (PRAVACHOL) 40 MG tablet TAKE 1 TABLET (40 MG TOTAL) BY MOUTH DAILY. REPEAT LABS ARE DUE NOW 90 tablet 1   Facility-Administered Medications Prior to Visit  Medication Dose Route Frequency Provider Last Rate Last Dose  . sodium chloride 0.9 % injection 10 mL  10 mL Intracatheter PRN Nicholas Lose, MD   10 mL at 09/21/15 1717    Allergies  Allergen Reactions  . Sulfa Antibiotics Other (See Comments)    As a child, rigid as a stick and not responsive  . Tape Other (See Comments)    Blisters, Please use "paper" tape only for short periods of time  . Glucosamine Forte [Nutritional Supplements] Rash  . Latex Rash    Review of Systems  Constitutional: Negative for fever.  HENT: Negative for congestion.   Eyes: Negative for blurred vision.  Respiratory: Negative for cough.   Cardiovascular: Negative for chest pain and palpitations.  Gastrointestinal: Negative for vomiting.  Musculoskeletal: Negative for back pain.  Skin: Negative for rash.  Neurological: Negative for loss of consciousness and headaches.       Objective:    Physical Exam  Constitutional: She is oriented to person, place, and  time. She appears well-developed and well-nourished. No distress.  HENT:  Head: Normocephalic and atraumatic.  Eyes: Conjunctivae are normal. Pupils are equal, round, and reactive to light.  Neck: Normal range of motion. No thyromegaly present.  Cardiovascular: Normal rate and regular rhythm.   Pulmonary/Chest: Effort normal and breath sounds normal. She has no wheezes.  Abdominal: Soft. Bowel sounds are normal. There is no tenderness.  Musculoskeletal: Normal range of motion. She exhibits no edema or deformity.  Neurological: She is alert and oriented to person, place, and time.  Skin: Skin is warm and dry. She is not diaphoretic.  Psychiatric: She has a normal mood and affect.    BP 130/78 (BP Location: Right Arm, Patient Position: Sitting, Cuff Size: Normal)   Pulse 64   Temp 97.9 F (36.6 C) (Oral)   Resp 16   Ht 5' 1"  (1.549 m)   Wt 159 lb (72.1 kg)   SpO2 96%   BMI 30.04 kg/m  Wt Readings from Last 3 Encounters:  08/29/16 159 lb (72.1 kg)  07/20/16 157 lb 12.8 oz (71.6 kg)  05/23/16 152 lb 12.8 oz (69.3 kg)     Lab Results  Component Value Date   WBC 8.0 08/29/2016   HGB 15.0 08/29/2016   HCT 44.5 08/29/2016   PLT 242.0 08/29/2016   GLUCOSE 93 08/29/2016   CHOL 149 08/29/2016   TRIG 84.0 08/29/2016   HDL 63.20 08/29/2016   LDLDIRECT 153.4 11/16/2009   LDLCALC 69 08/29/2016   ALT 17 08/29/2016   AST 17 08/29/2016   NA 141 08/29/2016   K 3.8 08/29/2016   CL 102 08/29/2016   CREATININE 0.72 08/29/2016   BUN 13 08/29/2016   CO2 29 08/29/2016   TSH 0.62 03/09/2011   INR 1.20 10/21/2015    Lab Results  Component Value Date   TSH 0.62 03/09/2011   Lab Results  Component Value Date   WBC 8.0 08/29/2016   HGB 15.0 08/29/2016   HCT 44.5 08/29/2016   MCV 91.4 08/29/2016   PLT 242.0 08/29/2016   Lab Results  Component Value Date   NA 141 08/29/2016   K 3.8 08/29/2016   CHLORIDE 104 09/21/2015   CO2 29 08/29/2016   GLUCOSE 93 08/29/2016   BUN 13  08/29/2016   CREATININE 0.72 08/29/2016   BILITOT 0.6 08/29/2016   ALKPHOS 62 08/29/2016   AST 17 08/29/2016   ALT 17 08/29/2016   PROT 6.8 08/29/2016   ALBUMIN 4.2 08/29/2016   CALCIUM 10.0 08/29/2016   ANIONGAP 7 09/21/2015   EGFR 67 (L) 09/21/2015   GFR 82.08 08/29/2016   Lab Results  Component Value Date   CHOL 149 08/29/2016   Lab Results  Component Value Date   HDL 63.20 08/29/2016   Lab Results  Component Value Date   LDLCALC 69 08/29/2016   Lab Results  Component Value Date   TRIG 84.0 08/29/2016   Lab Results  Component Value Date   CHOLHDL 2 08/29/2016   No results found for: HGBA1C     Assessment & Plan:   Problem List Items Addressed This Visit      Unprioritized   Hyperlipidemia   Relevant Medications   pravastatin (PRAVACHOL) 40 MG tablet   Other Relevant Orders   Lipid panel (Completed)   Comprehensive metabolic panel   Lipid panel   Essential hypertension - Primary   Relevant Medications   pravastatin (PRAVACHOL) 40 MG tablet   Other Relevant Orders   Comprehensive metabolic panel (Completed)   CBC (Completed)   Comprehensive metabolic panel   Lipid panel    Other Visit Diagnoses    Hyperlipidemia LDL goal <100  Relevant Medications   pravastatin (PRAVACHOL) 40 MG tablet   Trigger finger of left thumb       Relevant Orders   AMB referral to orthopedics   Fall, initial encounter       Relevant Orders   Ambulatory referral to Physical Therapy      I am having Ms. Anspaugh start on Zoster Vac Recomb Adjuvanted. I am also having her maintain her Calcium Carbonate-Vitamin D, Vitamin D3, Omega-3 Fatty Acids (FISH OIL PO), Biotin, Turmeric, Propylene Glycol, Psyllium (METAMUCIL PO), loratadine, cycloSPORINE, diltiazem, nystatin, flecainide, docusate sodium, apixaban, alendronate, and pravastatin.  Meds ordered this encounter  Medications  . pravastatin (PRAVACHOL) 40 MG tablet    Sig: Take 1 tablet (40 mg total) by mouth daily.  Repeat labs are due now    Dispense:  90 tablet    Refill:  1  . Zoster Vac Recomb Adjuvanted (SHINGRIX) 50 MCG SUSR    Sig: Inject 50 mcg into the muscle once.    Dispense:  1 each    Refill:  1    CMA served as scribe during this visit. History, Physical and Plan performed by medical provider. Documentation and orders reviewed and attested to.  Ann Held, DO Patient ID: Jessica Mullins, female   DOB: [birth year], 83 y.o.   MRN: [id number]

## 2016-08-29 NOTE — Patient Instructions (Signed)

## 2016-08-29 NOTE — Progress Notes (Signed)
Pre visit review using our clinic review tool, if applicable. No additional management support is needed unless otherwise documented below in the visit note. 

## 2016-09-01 ENCOUNTER — Ambulatory Visit: Payer: Medicare PPO | Attending: Family Medicine | Admitting: Physical Therapy

## 2016-09-01 DIAGNOSIS — R2681 Unsteadiness on feet: Secondary | ICD-10-CM

## 2016-09-01 DIAGNOSIS — R2689 Other abnormalities of gait and mobility: Secondary | ICD-10-CM

## 2016-09-01 NOTE — Therapy (Addendum)
[facility] [address]  [place], [place], [address] Phone: [phone]   Fax:  [phone]  Physical Therapy Evaluation  Patient Details  Name: Jessica Mullins MRN: [id number] Date of Birth: Sep 09, 1930 Referring Provider: Dr. Roma Schanz  Encounter Date: 09/01/2016      PT End of Session - 09/01/16 1254    Visit Number 1   Number of Visits 12   Date for PT Re-Evaluation 10/13/16   Authorization Type Medicare   PT Start Time 0920   PT Stop Time 1007   PT Time Calculation (min) 47 min   Activity Tolerance Patient tolerated treatment well   Behavior During Therapy [facility] for tasks assessed/performed      Past Medical History:  Diagnosis Date  . Allergy   . Ankle fracture, left   . Breast cancer ([place]) 1115/16   left   . Bursitis of right shoulder   . Cataract   . Family history of cancer   . Fracture of right wrist   . GERD (gastroesophageal reflux disease)   . Hyperlipidemia   . Hypertension   . Skin cancer 2000   melanoma and basal cell  . Vaso vagal episode    during preparation for colonoscopy    Past Surgical History:  Procedure Laterality Date  . ABDOMINAL HYSTERECTOMY  1988   TAH/BSO--FIBROIDS  . APPENDECTOMY    . BREAST LUMPECTOMY     B/L--FCS  . BREAST LUMPECTOMY WITH RADIOACTIVE SEED AND SENTINEL LYMPH NODE BIOPSY Left 06/11/2015   Procedure: LEFT BREAST LUMPECTOMY WITH RADIOACTIVE SEED AND LEFT SENTINEL LYMPH NODE MAPPING;  Surgeon: Erroll Luna, MD;  Location: [facility];  Service: General;  Laterality: Left;  . CATARACT EXTRACTION  2015  . COLONOSCOPY  2010  . DG  BONE DENSITY ([facility] HX)    . EYE SURGERY Bilateral    cataracts  . fiberadenoma Bilateral 1978, 1980  . GANGLION CYST EXCISION     L  hand  . Lipiflow procedure      There were no vitals filed for this visit.      Subjective Assessment - 09/01/16 0920    Subjective Patient reporting a history of falls - last  fall 2 weeks prior. Has tried going to a Tai Chi class - did not like instructor so stopped attending. History of breat cancer - chemo and radiation treatment. Has seen a cardiologist - "was out of rhythm" and is currently on medication. Feels like her legs and brain don't "pay attention to each other." Does feel like her perception is off at times.  Reports BP "bounces around."   Pertinent History breast cancer, A-fib, HTN   Patient Stated Goals improve balance   Currently in Pain? No/denies   Multiple Pain Sites No            [facility] PT Assessment - 09/01/16 0929      Assessment   Medical Diagnosis Falls   Referring Provider Dr. Roma Schanz   Next MD Visit prn   Prior Therapy no     Precautions   Precautions None     Restrictions   Weight Bearing Restrictions No     Balance Screen   Has the patient fallen in the past 6 months Yes   How many times? 2   Has the patient had a decrease in activity level because of a fear of falling?  Yes   Is the patient reluctant to leave their  home because of a fear of falling?  No     Home Environment   Living Environment Private residence   Living Arrangements Alone   Type of [place] to enter   Entrance Stairs-Number of Steps 1   [place] One level   Home Equipment None     Prior Function   Level of Independence Independent   Vocation Retired   Museum/gallery curator    Leisure has gotten away from hiking and exercising; enjoys time with grandchildren     Cognition   Overall Cognitive Status Within Functional Limits for tasks assessed     Observation/Other Assessments   Focus on Therapeutic Outcomes (FOTO)  Neuro: 51 (49% limited, predicted 41% limited)     Sensation   Light Touch Appears Intact     Coordination   Gross Motor Movements are Fluid and Coordinated Yes  rapid alternating movements: intact   Finger Nose Finger Test intact   Heel Shin Test intact     Posture/Postural  Control   Posture/Postural Control Postural limitations   Postural Limitations Rounded Shoulders;Forward head     ROM / Strength   AROM / PROM / Strength AROM;Strength     AROM   Overall AROM  Within functional limits for tasks performed     Strength   Overall Strength Comments grossly 4/5 B LE -  no pain     Standardized Balance Assessment   Standardized Balance Assessment Berg Balance Test;Timed Up and Go Test;Five Times Sit to Stand   Five times sit to stand comments  12.87     Berg Balance Test   Sit to Stand Able to stand without using hands and stabilize independently   Standing Unsupported Able to stand safely 2 minutes   Sitting with Back Unsupported but Feet Supported on Floor or Stool Able to sit safely and securely 2 minutes   Stand to Sit Sits safely with minimal use of hands   Transfers Able to transfer safely, minor use of hands   Standing Unsupported with Eyes Closed Able to stand 10 seconds with supervision   Standing Ubsupported with Feet Together Able to place feet together independently and stand for 1 minute with supervision   From Standing, Reach Forward with Outstretched Arm Can reach forward >12 cm safely (5")   From Standing Position, Pick up Object from Floor Able to pick up shoe safely and easily   From Standing Position, Turn to Look Behind Over each Shoulder Looks behind from both sides and weight shifts well   Turn 360 Degrees Able to turn 360 degrees safely in 4 seconds or less   Standing Unsupported, Alternately Place Feet on Step/Stool Able to complete >2 steps/needs minimal assist   Standing Unsupported, One Foot in Front Needs help to step but can hold 15 seconds   Standing on One Leg Able to lift leg independently and hold equal to or more than 3 seconds   Total Score 45     Timed Up and Go Test   Normal TUG (seconds) 11.07   Manual TUG (seconds) 14.62   Cognitive TUG (seconds) 13.62     Functional Gait  Assessment   Gait assessed  Yes    Gait Level Surface Walks 20 ft in less than 7 sec but greater than 5.5 sec, uses assistive device, slower speed, mild gait deviations, or deviates 6-10 in outside of the 12 in walkway width.   Change in Gait Speed Able  to smoothly change walking speed without loss of balance or gait deviation. Deviate no more than 6 in outside of the 12 in walkway width.   Gait with Horizontal Head Turns Performs head turns smoothly with slight change in gait velocity (eg, minor disruption to smooth gait path), deviates 6-10 in outside 12 in walkway width, or uses an assistive device.   Gait with Vertical Head Turns Performs task with slight change in gait velocity (eg, minor disruption to smooth gait path), deviates 6 - 10 in outside 12 in walkway width or uses assistive device   Gait and Pivot Turn Pivot turns safely in greater than 3 sec and stops with no loss of balance, or pivot turns safely within 3 sec and stops with mild imbalance, requires small steps to catch balance.   Step Over Obstacle Is able to step over one shoe box (4.5 in total height) but must slow down and adjust steps to clear box safely. May require verbal cueing.   Gait with Narrow Base of Support Ambulates 4-7 steps.   Gait with Eyes Closed Walks 20 ft, slow speed, abnormal gait pattern, evidence for imbalance, deviates 10-15 in outside 12 in walkway width. Requires more than 9 sec to ambulate 20 ft.   Ambulating Backwards Walks 20 ft, uses assistive device, slower speed, mild gait deviations, deviates 6-10 in outside 12 in walkway width.   Steps Alternating feet, must use rail.   Total Score 18                             PT Education - 09/01/16 1253    Education provided Yes   Education Details exam findings, POC   Person(s) Educated Patient   Methods Explanation;Demonstration   Comprehension Verbalized understanding;Returned demonstration             PT Long Term Goals - 09/01/16 1300      PT LONG TERM  GOAL #1   Title patient to be independent with balance HEP (10/13/16)   Status New     PT LONG TERM GOAL #2   Title Patient to improve Berg to 52/56 demonstrating reduced fall risk (10/13/16)   Status New     PT LONG TERM GOAL #3   Title Patient to improve FGA to 25/30 deonstrating improved balance with gait and reduced fall risk (10/13/16)   Status New     PT LONG TERM GOAL #4   Title Patient to demonstrate good safety awareness with no reported falls (10/13/16)     PT LONG TERM GOAL #5   Status New               Plan - 09/01/16 1254    Clinical Impression Statement Patient is a 83 y/o female presenting to [place] today with primary complaints of increased falls with unknown reason. Patient reporting her last fall 2 weeks prior after putting groceries onto counter and stating "my legs and brain wouldn't communicate, so I fell." Patient denies any head injuries or fractures resulting from falls. Patient reporting being recently taken off of HTN medication, but does state she conitnues to perform home BP measurements with findings that "bounce around." Patient taken through multiple balance assessments today, of which placed her in moderate to high fall risk categories. Patient to benefit form PT to improve balance for reduced fall risk.    Rehab Potential Good   PT Frequency 2x / week   PT Duration 6  weeks   PT Treatment/Interventions ADLs/Self Care Home Management;Canalith Repostioning;Cryotherapy;Electrical Stimulation;Moist Heat;Ultrasound;Neuromuscular re-education;Balance training;Therapeutic exercise;Therapeutic activities;Functional mobility training;Stair training;Gait training;Patient/family education;Manual techniques;Visual/perceptual remediation/compensation;Vestibular;Taping   PT Next Visit Plan assess vestibular input, orthostatics   Consulted and Agree with Plan of Care Patient      Patient will benefit from skilled therapeutic intervention in order to improve the  following deficits and impairments:  Decreased balance, Decreased safety awareness, Decreased mobility  Visit Diagnosis: Unsteadiness on feet  Other abnormalities of gait and mobility       G-Codes - 2016/09/21 1302    Functional Assessment Tool Used (Outpatient Only) FOTO: 49% limited   Functional Limitation Mobility: Walking and moving around   Mobility: Walking and Moving Around Current Status ([id number]) At least 40 percent but less than 60 percent impaired, limited or restricted   Mobility: Walking and Moving Around Goal Status [phone]) At least 20 percent but less than 40 percent impaired, limited or restricted      Problem List Patient Active Problem List   Diagnosis Date Noted  . Atrial fibrillation ([place]) 08/10/2015  . Orthostatic hypotension 08/04/2015  . Leukocytosis 07/27/2015  . Genetic testing 07/27/2015  . Family history of cancer   . Cancer of central portion of left female breast ([place]) 06/01/2015  . Bilateral low back pain without sciatica 11/18/2014  . Right shoulder pain 04/10/2014  . Muscle spasm of back 01/13/2014  . Olecranon bursitis of left elbow 08/26/2013  . Posterolateral cervical muscle strain 12/14/2012  . Hyperlipidemia 02/16/2010  . TMJ PAIN 02/16/2010  . SEBORRHEIC KERATOSIS 08/24/2009  . Essential hypertension 10/18/2007  . OSTEOPENIA 07/12/2007  . Melanoma of skin ([place]) 10/17/2006  . BREAST CYST 10/17/2006  . COLONOSCOPY, HX OF 10/17/2006    Lanney Gins, PT, DPT 09/21/16 1:05 PM   [facility] [address]  [place], [place], [address] Phone: [phone]   Fax:  [phone]  Name: Jessica Mullins MRN: [id number] Date of Birth: Jan 29, 1931

## 2016-09-05 ENCOUNTER — Ambulatory Visit: Payer: Medicare PPO | Admitting: Physical Therapy

## 2016-09-05 ENCOUNTER — Encounter: Payer: Self-pay | Admitting: *Deleted

## 2016-09-06 ENCOUNTER — Ambulatory Visit (INDEPENDENT_AMBULATORY_CARE_PROVIDER_SITE_OTHER): Payer: Self-pay | Admitting: Orthopaedic Surgery

## 2016-09-06 NOTE — Addendum Note (Signed)
Addended by: Allena Katz R on: 09/06/2016 12:19 PM   Modules accepted: Orders

## 2016-09-08 ENCOUNTER — Ambulatory Visit: Payer: Medicare PPO | Admitting: Physical Therapy

## 2016-09-08 DIAGNOSIS — R2681 Unsteadiness on feet: Secondary | ICD-10-CM | POA: Diagnosis not present

## 2016-09-08 DIAGNOSIS — R2689 Other abnormalities of gait and mobility: Secondary | ICD-10-CM

## 2016-09-08 NOTE — Therapy (Signed)
[facility] [address]  [place], [place], [address] Phone: [phone]   Fax:  [phone]  Physical Therapy Treatment  Patient Details  Name: Jessica Mullins MRN: [id number] Date of Birth: 04-10-1931 Referring Provider: Dr. Roma Schanz  Encounter Date: 09/08/2016      PT End of Session - 09/08/16 1009    Visit Number 2   Number of Visits 12   Date for PT Re-Evaluation 10/13/16   Authorization Type Medicare   PT Start Time 1008   PT Stop Time 1048   PT Time Calculation (min) 40 min   Activity Tolerance Patient tolerated treatment well   Behavior During Therapy [facility] for tasks assessed/performed      Past Medical History:  Diagnosis Date  . Allergy   . Ankle fracture, left   . Breast cancer ([place]) 1115/16   left   . Bursitis of right shoulder   . Cataract   . Family history of cancer   . Fracture of right wrist   . GERD (gastroesophageal reflux disease)   . Hyperlipidemia   . Hypertension   . Skin cancer 2000   melanoma and basal cell  . Vaso vagal episode    during preparation for colonoscopy    Past Surgical History:  Procedure Laterality Date  . ABDOMINAL HYSTERECTOMY  1988   TAH/BSO--FIBROIDS  . APPENDECTOMY    . BREAST LUMPECTOMY     B/L--FCS  . BREAST LUMPECTOMY WITH RADIOACTIVE SEED AND SENTINEL LYMPH NODE BIOPSY Left 06/11/2015   Procedure: LEFT BREAST LUMPECTOMY WITH RADIOACTIVE SEED AND LEFT SENTINEL LYMPH NODE MAPPING;  Surgeon: Erroll Luna, MD;  Location: [facility];  Service: General;  Laterality: Left;  . CATARACT EXTRACTION  2015  . COLONOSCOPY  2010  . DG  BONE DENSITY ([facility] HX)    . EYE SURGERY Bilateral    cataracts  . fiberadenoma Bilateral 1978, 1980  . GANGLION CYST EXCISION     L  hand  . Lipiflow procedure      There were no vitals filed for this visit.      Subjective Assessment - 09/08/16 1008    Subjective No recent falls - reports she feels her usual  "off-balance" feeling   Pertinent History breast cancer, A-fib, HTN   Patient Stated Goals improve balance   Currently in Pain? No/denies   Multiple Pain Sites No                Vestibular Assessment - 09/08/16 0001      Occulomotor Exam   Smooth Pursuits --  1 extra beat with extreme right/left gaze   Saccades Poor trajectory  slight overshooting initially; able to self correct     Positional Testing   Dix-Hallpike Dix-Hallpike Right;Dix-Hallpike Left     Dix-Hallpike Right   Dix-Hallpike Right Duration 30-60 seconds   Dix-Hallpike Right Symptoms No nystagmus  ear fullness     Dix-Hallpike Left   Dix-Hallpike Left Duration 30-60 seconds   Dix-Hallpike Left Symptoms No nystagmus     Orthostatics   BP supine (x 5 minutes) 114/74   HR supine (x 5 minutes) 64   BP sitting 112/80   HR sitting 64   BP standing (after 1 minute) 114/80   HR standing (after 1 minute) 74                 [facility] Adult PT Treatment/Exercise - 09/08/16 0001      Neuro Re-ed  Neuro Re-ed Details  gaze stabilization: seated head turns horizontal approx 15 turns left/right - no fixation; seated vertical head turns approx 15 turns up/down - no fixation; standing gaze fization with head/eye turn 90 degrees - some extra eye beats in all directions (L/R/up/down) - approx 10 turns each direction; gaze fixation with head/eye turns standing on foam - 10 reps each direction         Vestibular Treatment/Exercise - 09/08/16 0001      Vestibular Treatment/Exercise   Vestibular Treatment Provided Gaze   Gaze Exercises X1 Viewing Horizontal;X1 Viewing Vertical     X1 Viewing Horizontal   Foot Position seated   Time --  30 sec   Reps 2   Comments VOR x1      X1 Viewing Vertical   Foot Position seated   Time --  30 seconds   Reps 2   Comments VOR x1                    PT Long Term Goals - 09/08/16 1009      PT LONG TERM GOAL #1   Title patient to be independent  with balance HEP (10/13/16)   Status On-going     PT LONG TERM GOAL #2   Title Patient to improve Berg to 52/56 demonstrating reduced fall risk (10/13/16)   Status On-going     PT LONG TERM GOAL #3   Title Patient to improve FGA to 25/30 deonstrating improved balance with gait and reduced fall risk (10/13/16)   Status On-going     PT LONG TERM GOAL #4   Title Patient to demonstrate good safety awareness with no reported falls (10/13/16)   Status On-going               Plan - 09/08/16 1010    Clinical Impression Statement Patient doing well today. Assessed orthostatics today with all readings within normal limits. Smooth pursuits and saccades also assessed today with some overshooting as well as extra beating noted with eye movements. Dix-Hallpike testing negative on bilateral sides with only subjective reports of "ear fullness" with R sided testing. Patient given low level gaze stabilization tasks for treatment and HEP today with good tolerance and no LOB during treatment. Will plan to progress at subsequent visits.    PT Treatment/Interventions ADLs/Self Care Home Management;Canalith Repostioning;Cryotherapy;Electrical Stimulation;Moist Heat;Ultrasound;Neuromuscular re-education;Balance training;Therapeutic exercise;Therapeutic activities;Functional mobility training;Stair training;Gait training;Patient/family education;Manual techniques;Visual/perceptual remediation/compensation;Vestibular;Taping   PT Next Visit Plan progress VOR/gaze stabilization   Consulted and Agree with Plan of Care Patient      Patient will benefit from skilled therapeutic intervention in order to improve the following deficits and impairments:  Decreased balance, Decreased safety awareness, Decreased mobility  Visit Diagnosis: Unsteadiness on feet  Other abnormalities of gait and mobility     Problem List Patient Active Problem List   Diagnosis Date Noted  . Atrial fibrillation ([place]) 08/10/2015  .  Orthostatic hypotension 08/04/2015  . Leukocytosis 07/27/2015  . Genetic testing 07/27/2015  . Family history of cancer   . Cancer of central portion of left female breast ([place]) 06/01/2015  . Bilateral low back pain without sciatica 11/18/2014  . Right shoulder pain 04/10/2014  . Muscle spasm of back 01/13/2014  . Olecranon bursitis of left elbow 08/26/2013  . Posterolateral cervical muscle strain 12/14/2012  . Hyperlipidemia 02/16/2010  . TMJ PAIN 02/16/2010  . SEBORRHEIC KERATOSIS 08/24/2009  . Essential hypertension 10/18/2007  . OSTEOPENIA 07/12/2007  . Melanoma of skin ([place]) 10/17/2006  .  BREAST CYST 10/17/2006  . COLONOSCOPY, HX OF 10/17/2006     Lanney Gins, PT, DPT 09/08/16 1:00 PM   [facility] [address]  [place], [place], [address] Phone: [phone]   Fax:  [phone]  Name: Jessica Mullins MRN: [id number] Date of Birth: [birth year]

## 2016-09-08 NOTE — Patient Instructions (Signed)
Gaze Stabilization: Sitting    Keeping eyes on target on wall \_10N feet away, tilt head down 15-30 and move head side to side for __30__ seconds. Repeat while moving head up and down for __30__ seconds. Do __3-5__ sessions per day.   

## 2016-09-12 ENCOUNTER — Ambulatory Visit: Payer: Medicare PPO | Admitting: Physical Therapy

## 2016-09-12 DIAGNOSIS — R2689 Other abnormalities of gait and mobility: Secondary | ICD-10-CM | POA: Diagnosis not present

## 2016-09-12 DIAGNOSIS — R2681 Unsteadiness on feet: Secondary | ICD-10-CM | POA: Diagnosis not present

## 2016-09-12 NOTE — Progress Notes (Signed)
Pre visit review using our clinic review tool, if applicable. No additional management support is needed unless otherwise documented below in the visit note. 

## 2016-09-12 NOTE — Progress Notes (Addendum)
Subjective:   Jessica Mullins is a 83 y.o. female who presents for Medicare Annual (Subsequent) preventive examination.  Review of Systems:  No ROS.  Medicare Wellness Visit. Cardiac Risk Factors include: advanced age (>38men, >39 women);dyslipidemia;hypertension;sedentary lifestyle Sleep patterns:  Restless sleep. States at most 5 hrs at once. Feels tired. Home Safety/Smoke Alarms: Feels safe in home. Smoke alarms in place.   Living environment; residence and Firearm Safety: Lives alone in 1 story home. Guns safely stored. Seat Belt Safety/Bike Helmet: Wears seat belt.   Counseling:   Eye Exam- Wears glasses. [facility] annually. Dental- Dr.Lane every 4 months.  Female:   Pap- hysterectomy.  Mammo-  Per pt last 04/27/16-normal   Dexa scan-  Last 10/03/12: osteopenia. ORDERED TODAY.      CCS- Last 01/12/10: normal.     Objective:     Vitals: BP 130/76 (BP Location: Right Arm, Patient Position: Sitting, Cuff Size: Normal)   Pulse 63   Ht 5\' 1"  (1.549 m)   Wt 161 lb (73 kg)   SpO2 98%   BMI 30.42 kg/m   Body mass index is 30.42 kg/m.   Tobacco History  Smoking Status  . Never Smoker  Smokeless Tobacco  . Never Used     Counseling given: No   Past Medical History:  Diagnosis Date  . Allergy   . Ankle fracture, left   . Breast cancer ([place]) 1115/16   left   . Bursitis of right shoulder   . Cataract   . Family history of cancer   . Fracture of right wrist   . GERD (gastroesophageal reflux disease)   . Hyperlipidemia   . Hypertension   . Skin cancer 2000   melanoma and basal cell  . Vaso vagal episode    during preparation for colonoscopy   Past Surgical History:  Procedure Laterality Date  . ABDOMINAL HYSTERECTOMY  1988   TAH/BSO--FIBROIDS  . APPENDECTOMY    . BREAST LUMPECTOMY     B/L--FCS  . BREAST LUMPECTOMY WITH RADIOACTIVE SEED AND SENTINEL LYMPH NODE BIOPSY Left 06/11/2015   Procedure: LEFT BREAST LUMPECTOMY WITH RADIOACTIVE SEED AND LEFT  SENTINEL LYMPH NODE MAPPING;  Surgeon: Erroll Luna, MD;  Location: [facility];  Service: General;  Laterality: Left;  . CATARACT EXTRACTION  2015  . COLONOSCOPY  2010  . DG  BONE DENSITY ([place] HX)    . EYE SURGERY Bilateral    cataracts  . fiberadenoma Bilateral 1978, 1980  . GANGLION CYST EXCISION     L  hand  . Lipiflow procedure     Family History  Problem Relation Age of Onset  . Cancer Mother     mets to bone--? primary  . Coronary artery disease Brother     died of M! @ 40  . Diabetes Brother   . Hyperlipidemia Brother   . Hypertension Brother   . Heart disease Brother     chf  . Heart disease Father 87    MI  . Lung cancer Sister 1    former smoker  . Diabetes Maternal Aunt   . Lung cancer    . Thrombosis      thromboembolism clotting disorder--? father died of ? clot    History  Sexual Activity  . Sexual activity: No    Outpatient Encounter Prescriptions as of 09/13/2016  Medication Sig  . alendronate (FOSAMAX) 70 MG tablet TAKE 1 TABLET (70 MG TOTAL) BY MOUTH EVERY SEVEN DAYS. TAKE WITH A  FULL GLASS OF WATER AT NOON TIME ON EMPTY STOMACH.  [name] apixaban (ELIQUIS) 5 MG TABS tablet Take 1 tablet (5 mg total) by mouth 2 (two) times daily.  . Biotin (BIOTIN 5000) 5 MG CAPS Take 5 mg by mouth daily.   . Calcium Carbonate-Vitamin D (CALTRATE 600+D) 600-400 MG-UNIT per tablet Take 1 tablet by mouth daily.   . Cholecalciferol (VITAMIN D3) 1000 UNITS CAPS Take 1 capsule by mouth daily.    . cycloSPORINE (RESTASIS) 0.05 % ophthalmic emulsion Apply 1 drop to eye 2 (two) times daily.  [name] diltiazem (CARDIZEM CD) 180 MG 24 hr capsule TAKE 1 CAPSULE (180 MG TOTAL) BY MOUTH DAILY.  [name] docusate sodium (COLACE) 100 MG capsule Take 100 mg by mouth 2 (two) times daily as needed for mild constipation.  . flecainide (TAMBOCOR) 100 MG tablet Take 50 mg by mouth 2 (two) times daily.  [name] loratadine (CLARITIN) 10 MG tablet Take 10 mg by mouth daily.  [name] nystatin (MYCOSTATIN/NYSTOP) powder Apply  topically 3 (three) times daily.  . Omega-3 Fatty Acids (FISH OIL PO) Take 1 tablet by mouth daily.   . pravastatin (PRAVACHOL) 40 MG tablet Take 1 tablet (40 mg total) by mouth daily. Repeat labs are due now  . Propylene Glycol 0.6 % SOLN Apply 1 drop to eye daily as needed (dry eyes).   . Psyllium (METAMUCIL PO) Take 15 mLs by mouth daily.   . Turmeric 500 MG CAPS Take 1 capsule by mouth daily.    Facility-Administered Encounter Medications as of 09/13/2016  Medication  . sodium chloride 0.9 % injection 10 mL    Activities of Daily Living In your present state of health, do you have any difficulty performing the following activities: 09/13/2016 10/21/2015  Hearing? N N  Vision? N N  Difficulty concentrating or making decisions? N N  Walking or climbing stairs? Y Y  Dressing or bathing? N N  Doing errands, shopping? N -  Preparing Food and eating ? N -  Using the Toilet? N -  In the past six months, have you accidently leaked urine? N -  Do you have problems with loss of bowel control? N -  Managing your Medications? N -  Managing your Finances? N -  Housekeeping or managing your Housekeeping? N -  Some recent data might be hidden    Patient Care Team: Ann Held, DO as PCP - General Nicholas Lose, MD as Consulting Physician (Hematology and Oncology) Erroll Luna, MD as Consulting Physician (General Surgery)    Assessment:    Physical assessment deferred to PCP.  Exercise Activities and Dietary recommendations Current Exercise Habits: The patient does not participate in regular exercise at present   Diet (meal preparation, eat out, water intake, caffeinated beverages, dairy products, fruits and vegetables): in general, a "healthy" diet   Breakfast: Bran Muffin and fruit. Water and coffee Lunch: Sandwich and fruit and cookies. Water. Dinner: vegetables. Water/   Goals      Patient Stated   . <enter goal here> (pt-stated)          Maintain current healthy  lifestyle      Fall Risk Fall Risk  09/13/2016 08/29/2016 03/16/2016 01/06/2016 11/06/2015  Falls in the past year? Yes Yes Yes Yes Yes  Number falls in past yr: 2 or more 2 or more 1 2 or more 2 or more  Injury with Fall? No No No No No  Risk Factor Category  - - - - -  Risk for fall due to : - - - History of fall(s);Impaired vision -  Risk for fall due to (comments): - - - wears glasses -  Follow up Education provided;Falls prevention discussed - - Falls evaluation completed -   Depression Screen PHQ 2/9 Scores 09/13/2016 08/29/2016 03/16/2016 01/06/2016  PHQ - 2 Score 0 0 0 0     Cognitive Function MMSE - Mini Mental State Exam 09/13/2016  Orientation to time 5  Orientation to Place 5  Registration 3  Attention/ Calculation 5  Recall 3  Language- name 2 objects 2  Language- repeat 1  Language- follow 3 step command 3  Language- read & follow direction 1  Write a sentence 1  Copy design 1  Total score 30        Immunization History  Administered Date(s) Administered  . H1N1 07/01/2008  . Influenza Whole 04/10/2007, 04/01/2009, 03/26/2010, 03/25/2012  . Influenza, High Dose Seasonal PF 04/16/2014, 04/09/2015  . Pneumococcal Conjugate-13 05/13/2014  . Pneumococcal Polysaccharide-23 08/29/2016  . Td 11/05/2003  . Tdap 06/28/2011  . Zoster 02/24/2010   Screening Tests Health Maintenance  Topic Date Due  . INFLUENZA VACCINE  01/26/2016  . MAMMOGRAM  05/05/2017  . TETANUS/TDAP  06/27/2021  . DEXA SCAN  Completed  . PNA vac Low Risk Adult  Completed      Plan:     Continue to eat heart healthy diet (full of fruits, vegetables, whole grains, lean protein, water--limit salt, fat, and sugar intake) and increase physical activity as tolerated.  Continue doing brain stimulating activities (puzzles, reading, adult coloring books, staying active) to keep memory sharp.   During the course of the visit the patient was educated and counseled about the following appropriate  screening and preventive services:   Vaccines to include Pneumoccal, Influenza, Hepatitis B, Td, HCV  Cardiovascular Disease  Colorectal cancer screening  Bone density screening  Diabetes screening  Glaucoma screening  Mammography/PAP  Nutrition counseling   Patient Instructions (the written plan) was given to the patient.   Shela Nevin, [place]  09/13/2016    Reviewed.   Ann Held, DO

## 2016-09-12 NOTE — Therapy (Signed)
[facility] [address]  [place], [place], [address] Phone: [phone]   Fax:  [phone]  Physical Therapy Treatment  Patient Details  Name: Jessica Mullins MRN: [id number] Date of Birth: 1930/07/02 Referring Provider: Dr. Roma Schanz  Encounter Date: 09/12/2016      PT End of Session - 09/12/16 0848    Visit Number 3   Number of Visits 12   Date for PT Re-Evaluation 10/13/16   Authorization Type Medicare   PT Start Time 0848   PT Stop Time 0927   PT Time Calculation (min) 39 min   Activity Tolerance Patient tolerated treatment well   Behavior During Therapy [facility] for tasks assessed/performed      Past Medical History:  Diagnosis Date  . Allergy   . Ankle fracture, left   . Breast cancer ([place]) 1115/16   left   . Bursitis of right shoulder   . Cataract   . Family history of cancer   . Fracture of right wrist   . GERD (gastroesophageal reflux disease)   . Hyperlipidemia   . Hypertension   . Skin cancer 2000   melanoma and basal cell  . Vaso vagal episode    during preparation for colonoscopy    Past Surgical History:  Procedure Laterality Date  . ABDOMINAL HYSTERECTOMY  1988   TAH/BSO--FIBROIDS  . APPENDECTOMY    . BREAST LUMPECTOMY     B/L--FCS  . BREAST LUMPECTOMY WITH RADIOACTIVE SEED AND SENTINEL LYMPH NODE BIOPSY Left 06/11/2015   Procedure: LEFT BREAST LUMPECTOMY WITH RADIOACTIVE SEED AND LEFT SENTINEL LYMPH NODE MAPPING;  Surgeon: Erroll Luna, MD;  Location: [facility];  Service: General;  Laterality: Left;  . CATARACT EXTRACTION  2015  . COLONOSCOPY  2010  . DG  BONE DENSITY ([place] HX)    . EYE SURGERY Bilateral    cataracts  . fiberadenoma Bilateral 1978, 1980  . GANGLION CYST EXCISION     L  hand  . Lipiflow procedure      There were no vitals filed for this visit.      Subjective Assessment - 09/12/16 0852    Subjective Pt reports she continues to have the dizzy  feeling when she is walking - "feels like I'm walking in a straight line, but I'm not".   Pertinent History breast cancer, A-fib, HTN   Patient Stated Goals improve balance   Currently in Pain? No/denies                         [facility] Adult PT Treatment/Exercise - 09/12/16 0848      Neuro Re-ed    Neuro Re-ed Details  gaze stabilization: seated head turns horizontal approx 15 turns left/right - no fixation; seated vertical head turns approx 15 turns up/down - no fixation; standing gaze fixation with head/eye turn 90 degrees - minimal extra eye beats in all directions (L/R/up/down) - approx 10 turns each direction; followed by 90 dg pivot L/R initially with head/eye movement preceding body pivot then head/eye & body in unison, each x10 reps - some extra beats noted when head/eyes and body moving in unison.         Vestibular Treatment/Exercise - 09/12/16 0848      Vestibular Treatment/Exercise   Vestibular Treatment Provided Gaze   Gaze Exercises X1 Viewing Horizontal;X1 Viewing Vertical     X1 Viewing Horizontal   Foot Position seated & standing  Time --  30 sec   Reps 2   Comments VOR x1      X1 Viewing Vertical   Foot Position seated & standing   Time --  30 seconds   Reps 2   Comments VOR x1            Balance Exercises - 09/12/16 0848      Balance Exercises: Standing   Standing Eyes Opened Narrow base of support (BOS);Foam/compliant surface;30 secs;Head turns;5 reps   Standing Eyes Closed Narrow base of support (BOS);Foam/compliant surface;10 secs   Partial Tandem Stance Eyes open;Foam/compliant surface;30 secs  lateral head turns x5                PT Long Term Goals - 09/08/16 1009      PT LONG TERM GOAL #1   Title patient to be independent with balance HEP (10/13/16)   Status On-going     PT LONG TERM GOAL #2   Title Patient to improve Berg to 52/56 demonstrating reduced fall risk (10/13/16)   Status On-going     PT LONG TERM  GOAL #3   Title Patient to improve FGA to 25/30 deonstrating improved balance with gait and reduced fall risk (10/13/16)   Status On-going     PT LONG TERM GOAL #4   Title Patient to demonstrate good safety awareness with no reported falls (10/13/16)   Status On-going               Plan - 09/12/16 0859    Clinical Impression Statement Pt demonstrating minimal aberrant eye movement with basic VOR and gaze stabilization activities with abnormal eye movement becoming more apparent with 90 dg turn when head/eye moving in unison with body. Worked on corner balance with pt experiencing difficulty with head turn or eyes closed on complaint surfaces.   Rehab Potential Good   PT Treatment/Interventions ADLs/Self Care Home Management;Canalith Repostioning;Cryotherapy;Electrical Stimulation;Moist Heat;Ultrasound;Neuromuscular re-education;Balance training;Therapeutic exercise;Therapeutic activities;Functional mobility training;Stair training;Gait training;Patient/family education;Manual techniques;Visual/perceptual remediation/compensation;Vestibular;Taping   PT Next Visit Plan progress VOR/gaze stabilization, static/dynamic balance   Consulted and Agree with Plan of Care Patient      Patient will benefit from skilled therapeutic intervention in order to improve the following deficits and impairments:  Decreased balance, Decreased safety awareness, Decreased mobility  Visit Diagnosis: Unsteadiness on feet  Other abnormalities of gait and mobility     Problem List Patient Active Problem List   Diagnosis Date Noted  . Atrial fibrillation ([place]) 08/10/2015  . Orthostatic hypotension 08/04/2015  . Leukocytosis 07/27/2015  . Genetic testing 07/27/2015  . Family history of cancer   . Cancer of central portion of left female breast ([place]) 06/01/2015  . Bilateral low back pain without sciatica 11/18/2014  . Right shoulder pain 04/10/2014  . Muscle spasm of back 01/13/2014  . Olecranon  bursitis of left elbow 08/26/2013  . Posterolateral cervical muscle strain 12/14/2012  . Hyperlipidemia 02/16/2010  . TMJ PAIN 02/16/2010  . SEBORRHEIC KERATOSIS 08/24/2009  . Essential hypertension 10/18/2007  . OSTEOPENIA 07/12/2007  . Melanoma of skin ([place]) 10/17/2006  . BREAST CYST 10/17/2006  . COLONOSCOPY, HX OF 10/17/2006    Percival Spanish, PT, MPT 09/12/2016, 9:54 AM  [facility] [address]  [place], [place], [address] Phone: [phone]   Fax:  [phone]  Name: Jessica Mullins MRN: [id number] Date of Birth: 11/28/1930

## 2016-09-13 ENCOUNTER — Ambulatory Visit (INDEPENDENT_AMBULATORY_CARE_PROVIDER_SITE_OTHER): Payer: Medicare PPO | Admitting: *Deleted

## 2016-09-13 ENCOUNTER — Encounter: Payer: Self-pay | Admitting: *Deleted

## 2016-09-13 VITALS — BP 130/76 | HR 63 | Ht 61.0 in | Wt 161.0 lb

## 2016-09-13 DIAGNOSIS — Z0000 Encounter for general adult medical examination without abnormal findings: Secondary | ICD-10-CM | POA: Diagnosis not present

## 2016-09-13 DIAGNOSIS — Z780 Asymptomatic menopausal state: Secondary | ICD-10-CM

## 2016-09-13 NOTE — Patient Instructions (Addendum)
Continue to eat heart healthy diet (full of fruits, vegetables, whole grains, lean protein, water--limit salt, fat, and sugar intake) and increase physical activity as tolerated.  Continue doing brain stimulating activities (puzzles, reading, adult coloring books, staying active) to keep memory sharp.    It is important to avoid accidents which may result in broken bones.  Here are a few ideas on how to make your home safer so you will be less likely to trip or fall.  1. Use nonskid mats or non slip strips in your shower or tub, on your bathroom floor and around sinks.  If you know that you have spilled water, wipe it up! 2. In the bathroom, it is important to have properly installed grab bars on the walls or on the edge of the tub.  Towel racks are NOT strong enough for you to hold onto or to pull on for support. 3. Stairs and hallways should have enough light.  Add lamps or night lights if you need ore light. 4. It is good to have handrails on both sides of the stairs if possible.  Always fix broken handrails right away. 5. It is important to see the edges of steps.  Paint the edges of outdoor steps white so you can see them better.  Put colored tape on the edge of inside steps. 6. Throw-rugs are dangerous because they can slide.  Removing the rugs is the best idea, but if they must stay, add adhesive carpet tape to prevent slipping. 7. Do not keep things on stairs or in the halls.  Remove small furniture that blocks the halls as it may cause you to trip.  Keep telephone and electrical cords out of the way where you walk. 8. Always were sturdy, rubber-soled shoes for good support.  Never wear just socks, especially on the stairs.  Socks may cause you to slip or fall.  Do not wear full-length housecoats as you can easily trip on the bottom.  9. Place the things you use the most on the shelves that are the easiest to reach.  If you use a stepstool, make sure it is in good condition.  If you feel  unsteady, DO NOT climb, ask for help. 10. If a health professional advises you to use a cane or walker, do not be ashamed.  These items can keep you from falling and breaking your bones.

## 2016-09-16 ENCOUNTER — Ambulatory Visit (HOSPITAL_BASED_OUTPATIENT_CLINIC_OR_DEPARTMENT_OTHER)
Admission: RE | Admit: 2016-09-16 | Discharge: 2016-09-16 | Disposition: A | Discharge status: Home / Self Care | Payer: Medicare PPO | Source: Ambulatory Visit | Attending: Family Medicine | Admitting: Family Medicine

## 2016-09-16 ENCOUNTER — Ambulatory Visit: Payer: Medicare PPO | Admitting: Physical Therapy

## 2016-09-16 DIAGNOSIS — R2689 Other abnormalities of gait and mobility: Secondary | ICD-10-CM

## 2016-09-16 DIAGNOSIS — R2681 Unsteadiness on feet: Secondary | ICD-10-CM

## 2016-09-16 DIAGNOSIS — M85852 Other specified disorders of bone density and structure, left thigh: Secondary | ICD-10-CM | POA: Diagnosis not present

## 2016-09-16 DIAGNOSIS — Z780 Asymptomatic menopausal state: Secondary | ICD-10-CM | POA: Insufficient documentation

## 2016-09-16 DIAGNOSIS — M8588 Other specified disorders of bone density and structure, other site: Secondary | ICD-10-CM | POA: Insufficient documentation

## 2016-09-16 NOTE — Therapy (Signed)
[facility] [address]  [place], [place], [address] Phone: [phone]   Fax:  [phone]  Physical Therapy Treatment  Patient Details  Name: Jessica Mullins MRN: [id number] Date of Birth: August 06, 1930 Referring Provider: Dr. Roma Schanz  Encounter Date: 09/16/2016      PT End of Session - 09/16/16 1015    Visit Number 4   Number of Visits 12   Date for PT Re-Evaluation 10/13/16   Authorization Type Medicare   PT Start Time 9390   PT Stop Time 1055   PT Time Calculation (min) 40 min   Activity Tolerance Patient tolerated treatment well   Behavior During Therapy [facility] for tasks assessed/performed      Past Medical History:  Diagnosis Date  . Allergy   . Ankle fracture, left   . Breast cancer ([place]) 1115/16   left   . Bursitis of right shoulder   . Cataract   . Family history of cancer   . Fracture of right wrist   . GERD (gastroesophageal reflux disease)   . Hyperlipidemia   . Hypertension   . Skin cancer 2000   melanoma and basal cell  . Vaso vagal episode    during preparation for colonoscopy    Past Surgical History:  Procedure Laterality Date  . ABDOMINAL HYSTERECTOMY  1988   TAH/BSO--FIBROIDS  . APPENDECTOMY    . BREAST LUMPECTOMY     B/L--FCS  . BREAST LUMPECTOMY WITH RADIOACTIVE SEED AND SENTINEL LYMPH NODE BIOPSY Left 06/11/2015   Procedure: LEFT BREAST LUMPECTOMY WITH RADIOACTIVE SEED AND LEFT SENTINEL LYMPH NODE MAPPING;  Surgeon: Erroll Luna, MD;  Location: [facility];  Service: General;  Laterality: Left;  . CATARACT EXTRACTION  2015  . COLONOSCOPY  2010  . DG  BONE DENSITY ([place] HX)    . EYE SURGERY Bilateral    cataracts  . fiberadenoma Bilateral 1978, 1980  . GANGLION CYST EXCISION     L  hand  . Lipiflow procedure      There were no vitals filed for this visit.      Subjective Assessment - 09/16/16 1019    Subjective Pt continues to report her "usual  unsteadiness".   Pertinent History breast cancer, A-fib, HTN   Patient Stated Goals improve balance                         [facility] Adult PT Treatment/Exercise - 09/16/16 1015      High Level Balance   High Level Balance Activities Head turns   High Level Balance Comments gait with horiz scanning, initially with slow head turns, followed by target acquistion with playing cards taped to wall     Neuro Re-ed    Neuro Re-ed Details  90 dg pivot L/R initially with head/eye movement preceding body pivot then head/eye & body in unison, each x10 reps         Vestibular Treatment/Exercise - 09/16/16 1015      Vestibular Treatment/Exercise   Vestibular Treatment Provided Gaze   Gaze Exercises X1 Viewing Horizontal;X1 Viewing Vertical;Eye/Head Exercise Horizontal;Eye/Head Exercise Vertical     X1 Viewing Horizontal   Foot Position standing - narrow BOS   Time --  30 sec   Reps 2   Comments VOR x1      X1 Viewing Vertical   Foot Position standing - narrow BOS   Time --  30 seconds   Reps  2   Comments VOR x1     Eye/Head Exercise Horizontal   Foot Position standing - narrow BOS   Time --  30 sec   Reps 2     Eye/Head Exercise Vertical   Foot Position standing - narrow BOS   Time --  30 sec   Reps 2            Balance Exercises - 09/16/16 1015      Balance Exercises: Standing   Standing Eyes Opened Narrow base of support (BOS);Foam/compliant surface;30 secs;Head turns;5 reps  rhomberg; horiz & vertical head turns   Standing Eyes Closed Narrow base of support (BOS);Foam/compliant surface;10 secs;2 reps  rhomberg; horiz & vertical head turns   Partial Tandem Stance Eyes open;Foam/compliant surface;30 secs;Eyes closed;10 secs  lateral & vertical head turns x5   Other Standing Exercises Standing step-touch to 8" step x20 from solid surface & x10 from Airex pad                PT Long Term Goals - 09/08/16 1009      PT LONG TERM GOAL #1    Title patient to be independent with balance HEP (10/13/16)   Status On-going     PT LONG TERM GOAL #2   Title Patient to improve Berg to 52/56 demonstrating reduced fall risk (10/13/16)   Status On-going     PT LONG TERM GOAL #3   Title Patient to improve FGA to 25/30 deonstrating improved balance with gait and reduced fall risk (10/13/16)   Status On-going     PT LONG TERM GOAL #4   Title Patient to demonstrate good safety awareness with no reported falls (10/13/16)   Status On-going               Plan - 09/16/16 1025    Clinical Impression Statement Pt continues to report overall feeling of unsteadiness but no recent falls. Balance deficits appear to be a combination of vestibular deficits as well as propriocpetive and generalized weakness/debility, hence feel pt may benefit from training in [place] fall prevention program.   Rehab Potential Good   PT Treatment/Interventions ADLs/Self Care Home Management;Canalith Repostioning;Cryotherapy;Electrical Stimulation;Moist Heat;Ultrasound;Neuromuscular re-education;Balance training;Therapeutic exercise;Therapeutic activities;Functional mobility training;Stair training;Gait training;Patient/family education;Manual techniques;Visual/perceptual remediation/compensation;Vestibular;Taping   PT Next Visit Plan [facility] fall prevention program, progress VOR/gaze stabilization, static/dynamic balance   Consulted and Agree with Plan of Care Patient      Patient will benefit from skilled therapeutic intervention in order to improve the following deficits and impairments:  Decreased balance, Decreased safety awareness, Decreased mobility  Visit Diagnosis: Unsteadiness on feet  Other abnormalities of gait and mobility     Problem List Patient Active Problem List   Diagnosis Date Noted  . Atrial fibrillation ([place]) 08/10/2015  . Orthostatic hypotension 08/04/2015  . Leukocytosis 07/27/2015  . Genetic testing 07/27/2015  . Family history of  cancer   . Cancer of central portion of left female breast ([place]) 06/01/2015  . Bilateral low back pain without sciatica 11/18/2014  . Right shoulder pain 04/10/2014  . Muscle spasm of back 01/13/2014  . Olecranon bursitis of left elbow 08/26/2013  . Posterolateral cervical muscle strain 12/14/2012  . Hyperlipidemia 02/16/2010  . TMJ PAIN 02/16/2010  . SEBORRHEIC KERATOSIS 08/24/2009  . Essential hypertension 10/18/2007  . OSTEOPENIA 07/12/2007  . Melanoma of skin ([place]) 10/17/2006  . BREAST CYST 10/17/2006  . COLONOSCOPY, HX OF 10/17/2006    Percival Spanish, PT, MPT 09/16/2016, 12:26 PM  [facility] [address]  [place], [place], [address] Phone: [phone]   Fax:  [phone]  Name: Jessica Mullins MRN: [id number] Date of Birth: 1931/01/02

## 2016-09-19 ENCOUNTER — Ambulatory Visit: Payer: Medicare PPO | Admitting: Physical Therapy

## 2016-09-19 DIAGNOSIS — R2681 Unsteadiness on feet: Secondary | ICD-10-CM

## 2016-09-19 DIAGNOSIS — R2689 Other abnormalities of gait and mobility: Secondary | ICD-10-CM | POA: Diagnosis not present

## 2016-09-19 NOTE — Therapy (Signed)
[facility] [address]  [place], [place], [address] Phone: [phone]   Fax:  [phone]  Physical Therapy Treatment  Patient Details  Name: Jessica Mullins MRN: [id number] Date of Birth: 02-18-1931 Referring Provider: Dr. Roma Schanz  Encounter Date: 09/19/2016      PT End of Session - 09/19/16 1151    Visit Number 5   Number of Visits 12   Date for PT Re-Evaluation 10/13/16   Authorization Type Medicare   PT Start Time 1100   PT Stop Time 1142   PT Time Calculation (min) 42 min   Activity Tolerance Patient tolerated treatment well   Behavior During Therapy [facility] for tasks assessed/performed      Past Medical History:  Diagnosis Date  . Allergy   . Ankle fracture, left   . Breast cancer ([place]) 1115/16   left   . Bursitis of right shoulder   . Cataract   . Family history of cancer   . Fracture of right wrist   . GERD (gastroesophageal reflux disease)   . Hyperlipidemia   . Hypertension   . Skin cancer 2000   melanoma and basal cell  . Vaso vagal episode    during preparation for colonoscopy    Past Surgical History:  Procedure Laterality Date  . ABDOMINAL HYSTERECTOMY  1988   TAH/BSO--FIBROIDS  . APPENDECTOMY    . BREAST LUMPECTOMY     B/L--FCS  . BREAST LUMPECTOMY WITH RADIOACTIVE SEED AND SENTINEL LYMPH NODE BIOPSY Left 06/11/2015   Procedure: LEFT BREAST LUMPECTOMY WITH RADIOACTIVE SEED AND LEFT SENTINEL LYMPH NODE MAPPING;  Surgeon: Erroll Luna, MD;  Location: [facility];  Service: General;  Laterality: Left;  . CATARACT EXTRACTION  2015  . COLONOSCOPY  2010  . DG  BONE DENSITY ([place] HX)    . EYE SURGERY Bilateral    cataracts  . fiberadenoma Bilateral 1978, 1980  . GANGLION CYST EXCISION     L  hand  . Lipiflow procedure      There were no vitals filed for this visit.      Subjective Assessment - 09/19/16 1150    Subjective feeling well - no falls   Pertinent History  breast cancer, A-fib, HTN   Patient Stated Goals improve balance   Currently in Pain? No/denies   Multiple Pain Sites No                              Balance Exercises - 09/19/16 1105      Balance Exercises: Standing   Standing Eyes Opened Narrow base of support (BOS);Foam/compliant surface;30 secs;Head turns;2 reps  1 rep with forward gaze; 1 rep with head turns   Standing Eyes Closed Narrow base of support (BOS);Foam/compliant surface;3 reps;20 secs   Partial Tandem Stance Eyes open;Foam/compliant surface;Upper extremity support 1;2 reps;30 secs     OTAGO PROGRAM   Head Movements Standing;5 reps   Neck Movements Standing;5 reps   Back Extension Standing;5 reps   Trunk Movements Standing;5 reps   Ankle Movements Standing;10 reps   Knee Extensor 20 reps;Weight (comment)  2#   Knee Flexor 20 reps;Weight (comment)  2#   Hip ABductor 20 reps;Weight (comment)  2#   Ankle Plantorflexors 20 reps, no support   Ankle Dorsiflexors 20 reps, support   Knee Bends 20 reps, support   Backwards Walking Support   Walking and Turning Around No assistive  device   Sideways Walking No assistive device   Tandem Stance 10 seconds, support   Tandem Walk Support   One Leg Stand 10 seconds, support   Heel Walking Support   Toe Walk No support   Heel Toe Walking Backward --  support   Sit to Stand 20 reps, no support                PT Long Term Goals - 09/08/16 1009      PT LONG TERM GOAL #1   Title patient to be independent with balance HEP (10/13/16)   Status On-going     PT LONG TERM GOAL #2   Title Patient to improve Berg to 52/56 demonstrating reduced fall risk (10/13/16)   Status On-going     PT LONG TERM GOAL #3   Title Patient to improve FGA to 25/30 deonstrating improved balance with gait and reduced fall risk (10/13/16)   Status On-going     PT LONG TERM GOAL #4   Title Patient to demonstrate good safety awareness with no reported falls (10/13/16)    Status On-going               Plan - 09/19/16 1151    Clinical Impression Statement Patient doing well today - no falls, but still reports unsteadiness, especailly when turning to the right. OTAGO introduced today with good ability to perform all tasks with no LOB - only reported unsteadiness during figure 8 walking when turning to the right. Patient to continue to progress static and dynamic balance on both firm and compliant surfaces at upcoming visits.    PT Treatment/Interventions ADLs/Self Care Home Management;Canalith Repostioning;Cryotherapy;Electrical Stimulation;Moist Heat;Ultrasound;Neuromuscular re-education;Balance training;Therapeutic exercise;Therapeutic activities;Functional mobility training;Stair training;Gait training;Patient/family education;Manual techniques;Visual/perceptual remediation/compensation;Vestibular;Taping   PT Next Visit Plan progress VOR/gaze stabilization, static/dynamic balance, review complaince/issues with OTAGO as needed   Consulted and Agree with Plan of Care Patient      Patient will benefit from skilled therapeutic intervention in order to improve the following deficits and impairments:  Decreased balance, Decreased safety awareness, Decreased mobility  Visit Diagnosis: Unsteadiness on feet  Other abnormalities of gait and mobility     Problem List Patient Active Problem List   Diagnosis Date Noted  . Atrial fibrillation ([place]) 08/10/2015  . Orthostatic hypotension 08/04/2015  . Leukocytosis 07/27/2015  . Genetic testing 07/27/2015  . Family history of cancer   . Cancer of central portion of left female breast ([place]) 06/01/2015  . Bilateral low back pain without sciatica 11/18/2014  . Right shoulder pain 04/10/2014  . Muscle spasm of back 01/13/2014  . Olecranon bursitis of left elbow 08/26/2013  . Posterolateral cervical muscle strain 12/14/2012  . Hyperlipidemia 02/16/2010  . TMJ PAIN 02/16/2010  . SEBORRHEIC KERATOSIS  08/24/2009  . Essential hypertension 10/18/2007  . OSTEOPENIA 07/12/2007  . Melanoma of skin ([place]) 10/17/2006  . BREAST CYST 10/17/2006  . COLONOSCOPY, HX OF 10/17/2006    Lanney Gins, PT, DPT 09/19/16 11:54 AM   [facility] [address]  [place], [place], [address] Phone: [phone]   Fax:  [phone]  Name: Jessica Mullins MRN: [id number] Date of Birth: September 19, 1930

## 2016-09-21 ENCOUNTER — Ambulatory Visit: Payer: Medicare PPO | Admitting: Physical Therapy

## 2016-09-22 ENCOUNTER — Ambulatory Visit: Payer: Medicare PPO | Admitting: Physical Therapy

## 2016-09-22 DIAGNOSIS — R2681 Unsteadiness on feet: Secondary | ICD-10-CM

## 2016-09-22 DIAGNOSIS — R2689 Other abnormalities of gait and mobility: Secondary | ICD-10-CM

## 2016-09-22 NOTE — Therapy (Signed)
[facility] [address]  [place], [place], [address] Phone: [phone]   Fax:  [phone]  Physical Therapy Treatment  Patient Details  Name: Jessica Mullins MRN: [id number] Date of Birth: 1930/11/27 Referring Provider: Dr. Roma Schanz  Encounter Date: 09/22/2016      PT End of Session - 09/22/16 1359    Visit Number 6   Number of Visits 12   Date for PT Re-Evaluation 10/13/16   Authorization Type Medicare   PT Start Time 8527   PT Stop Time 1438   PT Time Calculation (min) 41 min   Activity Tolerance Patient tolerated treatment well   Behavior During Therapy [facility] for tasks assessed/performed      Past Medical History:  Diagnosis Date  . Allergy   . Ankle fracture, left   . Breast cancer ([place]) 1115/16   left   . Bursitis of right shoulder   . Cataract   . Family history of cancer   . Fracture of right wrist   . GERD (gastroesophageal reflux disease)   . Hyperlipidemia   . Hypertension   . Skin cancer 2000   melanoma and basal cell  . Vaso vagal episode    during preparation for colonoscopy    Past Surgical History:  Procedure Laterality Date  . ABDOMINAL HYSTERECTOMY  1988   TAH/BSO--FIBROIDS  . APPENDECTOMY    . BREAST LUMPECTOMY     B/L--FCS  . BREAST LUMPECTOMY WITH RADIOACTIVE SEED AND SENTINEL LYMPH NODE BIOPSY Left 06/11/2015   Procedure: LEFT BREAST LUMPECTOMY WITH RADIOACTIVE SEED AND LEFT SENTINEL LYMPH NODE MAPPING;  Surgeon: Erroll Luna, MD;  Location: [facility];  Service: General;  Laterality: Left;  . CATARACT EXTRACTION  2015  . COLONOSCOPY  2010  . DG  BONE DENSITY ([place] HX)    . EYE SURGERY Bilateral    cataracts  . fiberadenoma Bilateral 1978, 1980  . GANGLION CYST EXCISION     L  hand  . Lipiflow procedure      There were no vitals filed for this visit.      Subjective Assessment - 09/22/16 1359    Subjective Did OTAGO at home - used ankle weights -some  muscle soreness   Pertinent History breast cancer, A-fib, HTN   Patient Stated Goals improve balance   Currently in Pain? No/denies   Multiple Pain Sites No                         [facility] Adult PT Treatment/Exercise - 09/22/16 0001      Neuro Re-ed    Neuro Re-ed Details  standing on foam - reaching and tossing bean bags x 12 each side; standing on foam reaching and hitting beach ball outside of BOS x 4 minutes; room scanning with object retrieval (high and low) x 12 cones; standing on firm surface with perturbations by PT from all directions x 4 minutes             Balance Exercises - 09/22/16 1449      Balance Exercises: Standing   Standing Eyes Closed Narrow base of support (BOS);Foam/compliant surface;2 reps;20 secs   Tandem Gait Forward;Retro;Upper extremity support;5 reps   Sidestepping Foam/compliant support;Upper extremity support;5 reps   Sit to Stand Time sit to stand from low mat table standing on 1 AirEx x 15 reps  PT Long Term Goals - 09/08/16 1009      PT LONG TERM GOAL #1   Title patient to be independent with balance HEP (10/13/16)   Status On-going     PT LONG TERM GOAL #2   Title Patient to improve Berg to 52/56 demonstrating reduced fall risk (10/13/16)   Status On-going     PT LONG TERM GOAL #3   Title Patient to improve FGA to 25/30 deonstrating improved balance with gait and reduced fall risk (10/13/16)   Status On-going     PT LONG TERM GOAL #4   Title Patient to demonstrate good safety awareness with no reported falls (10/13/16)   Status On-going               Plan - 09/22/16 1359    Clinical Impression Statement Patient doing well today with all balance activities. Does require UE support from PT on compliant surfaces (CGA to Min A) to maintain upright balance. Standing perturbations today with patient with no LOB, however increased difficulty with L to R perturbations. Will continue to progress  dynamic balance at next visit.    PT Treatment/Interventions ADLs/Self Care Home Management;Canalith Repostioning;Cryotherapy;Electrical Stimulation;Moist Heat;Ultrasound;Neuromuscular re-education;Balance training;Therapeutic exercise;Therapeutic activities;Functional mobility training;Stair training;Gait training;Patient/family education;Manual techniques;Visual/perceptual remediation/compensation;Vestibular;Taping   PT Next Visit Plan progress VOR/gaze stabilization, static/dynamic balance, review complaince/issues with OTAGO as needed   Consulted and Agree with Plan of Care Patient      Patient will benefit from skilled therapeutic intervention in order to improve the following deficits and impairments:  Decreased balance, Decreased safety awareness, Decreased mobility  Visit Diagnosis: Unsteadiness on feet  Other abnormalities of gait and mobility     Problem List Patient Active Problem List   Diagnosis Date Noted  . Atrial fibrillation ([place]) 08/10/2015  . Orthostatic hypotension 08/04/2015  . Leukocytosis 07/27/2015  . Genetic testing 07/27/2015  . Family history of cancer   . Cancer of central portion of left female breast ([place]) 06/01/2015  . Bilateral low back pain without sciatica 11/18/2014  . Right shoulder pain 04/10/2014  . Muscle spasm of back 01/13/2014  . Olecranon bursitis of left elbow 08/26/2013  . Posterolateral cervical muscle strain 12/14/2012  . Hyperlipidemia 02/16/2010  . TMJ PAIN 02/16/2010  . SEBORRHEIC KERATOSIS 08/24/2009  . Essential hypertension 10/18/2007  . OSTEOPENIA 07/12/2007  . Melanoma of skin ([place]) 10/17/2006  . BREAST CYST 10/17/2006  . COLONOSCOPY, HX OF 10/17/2006     Lanney Gins, PT, DPT 09/22/16 2:57 PM   [facility] [address]  [place], [place], [address] Phone: [phone]   Fax:  [phone]  Name: Jessica Mullins MRN: [id number] Date of Birth:  [birth year]

## 2016-09-23 ENCOUNTER — Other Ambulatory Visit: Payer: Self-pay | Admitting: Family Medicine

## 2016-09-23 ENCOUNTER — Other Ambulatory Visit (HOSPITAL_COMMUNITY): Payer: Self-pay | Admitting: Internal Medicine

## 2016-09-23 DIAGNOSIS — E785 Hyperlipidemia, unspecified: Secondary | ICD-10-CM

## 2016-09-26 ENCOUNTER — Other Ambulatory Visit: Payer: Self-pay | Admitting: Hematology and Oncology

## 2016-09-26 ENCOUNTER — Ambulatory Visit: Payer: Medicare PPO | Attending: Family Medicine | Admitting: Physical Therapy

## 2016-09-26 DIAGNOSIS — R2681 Unsteadiness on feet: Secondary | ICD-10-CM | POA: Diagnosis not present

## 2016-09-26 DIAGNOSIS — R2689 Other abnormalities of gait and mobility: Secondary | ICD-10-CM | POA: Diagnosis not present

## 2016-09-26 DIAGNOSIS — I4819 Other persistent atrial fibrillation: Secondary | ICD-10-CM

## 2016-09-26 NOTE — Therapy (Signed)
[facility] [address]  [place], [place], [address] Phone: [phone]   Fax:  [phone]  Physical Therapy Treatment  Patient Details  Name: Jessica Mullins MRN: [id number] Date of Birth: 09/17/1930 Referring Provider: Dr. Roma Schanz  Encounter Date: 09/26/2016      PT End of Session - 09/26/16 1152    Visit Number 7   Number of Visits 12   Date for PT Re-Evaluation 10/13/16   Authorization Type Medicare   PT Start Time 1101   PT Stop Time 1141   PT Time Calculation (min) 40 min   Activity Tolerance Patient tolerated treatment well   Behavior During Therapy [facility] for tasks assessed/performed      Past Medical History:  Diagnosis Date  . Allergy   . Ankle fracture, left   . Breast cancer ([place]) 1115/16   left   . Bursitis of right shoulder   . Cataract   . Family history of cancer   . Fracture of right wrist   . GERD (gastroesophageal reflux disease)   . Hyperlipidemia   . Hypertension   . Skin cancer 2000   melanoma and basal cell  . Vaso vagal episode    during preparation for colonoscopy    Past Surgical History:  Procedure Laterality Date  . ABDOMINAL HYSTERECTOMY  1988   TAH/BSO--FIBROIDS  . APPENDECTOMY    . BREAST LUMPECTOMY     B/L--FCS  . BREAST LUMPECTOMY WITH RADIOACTIVE SEED AND SENTINEL LYMPH NODE BIOPSY Left 06/11/2015   Procedure: LEFT BREAST LUMPECTOMY WITH RADIOACTIVE SEED AND LEFT SENTINEL LYMPH NODE MAPPING;  Surgeon: Erroll Luna, MD;  Location: [facility];  Service: General;  Laterality: Left;  . CATARACT EXTRACTION  2015  . COLONOSCOPY  2010  . DG  BONE DENSITY ([place] HX)    . EYE SURGERY Bilateral    cataracts  . fiberadenoma Bilateral 1978, 1980  . GANGLION CYST EXCISION     L  hand  . Lipiflow procedure      There were no vitals filed for this visit.      Subjective Assessment - 09/26/16 1151    Subjective Feeling well - did not feel off balance  yesterday, but feels like she is "off" today   Pertinent History breast cancer, A-fib, HTN   Patient Stated Goals improve balance   Currently in Pain? No/denies   Multiple Pain Sites No                         [facility] Adult PT Treatment/Exercise - 09/26/16 0001      Neuro Re-ed    Neuro Re-ed Details  tandem stance on foam reaching/tossinbg bean bags x 12 each side; narrow BOS on foam cross-body diagonals with yellow med ball x 10 each direction; ambulation with horizontal head turns, vertical head turns as well as cognitive tasks x 4 at approx 250 feet each direction; side stepping over 6 bolsters x 2 each direction; bacwards walking 30 feet x 2; karaoke 30 feet x 2 each direction;             Balance Exercises - 09/26/16 1155      Balance Exercises: Standing   Standing Eyes Closed Narrow base of support (BOS);Foam/compliant surface;3 reps;30 secs   Tandem Gait Forward;Retro;Foam/compliant surface;5 reps;Upper extremity support   Sidestepping Foam/compliant support;5 reps   Sit to Stand Time sit to stand from low mat table standing  on 1 AirEx x 15 reps - holding 5#                PT Long Term Goals - 09/08/16 1009      PT LONG TERM GOAL #1   Title patient to be independent with balance HEP (10/13/16)   Status On-going     PT LONG TERM GOAL #2   Title Patient to improve Berg to 52/56 demonstrating reduced fall risk (10/13/16)   Status On-going     PT LONG TERM GOAL #3   Title Patient to improve FGA to 25/30 deonstrating improved balance with gait and reduced fall risk (10/13/16)   Status On-going     PT LONG TERM GOAL #4   Title Patient to demonstrate good safety awareness with no reported falls (10/13/16)   Status On-going               Plan - 09/26/16 1152    Clinical Impression Statement Patient doing well today with static and dynamic balance tasks. Some difficulty today with tandem gait as well as side-stepping on foam requiring  increased UE support from PT as compared to other visits. Patient reporting she feels unbalanced today for unknown reasons - did not feel this way yesterday. Patient to continue to work on complaint surfaces with standing and dynamic movements to improve balance for carryover into daily activities.    PT Treatment/Interventions ADLs/Self Care Home Management;Canalith Repostioning;Cryotherapy;Electrical Stimulation;Moist Heat;Ultrasound;Neuromuscular re-education;Balance training;Therapeutic exercise;Therapeutic activities;Functional mobility training;Stair training;Gait training;Patient/family education;Manual techniques;Visual/perceptual remediation/compensation;Vestibular;Taping   PT Next Visit Plan progress VOR/gaze stabilization, static/dynamic balance, review complaince/issues with OTAGO as needed   Consulted and Agree with Plan of Care Patient      Patient will benefit from skilled therapeutic intervention in order to improve the following deficits and impairments:  Decreased balance, Decreased safety awareness, Decreased mobility  Visit Diagnosis: Unsteadiness on feet  Other abnormalities of gait and mobility     Problem List Patient Active Problem List   Diagnosis Date Noted  . Atrial fibrillation ([place]) 08/10/2015  . Orthostatic hypotension 08/04/2015  . Leukocytosis 07/27/2015  . Genetic testing 07/27/2015  . Family history of cancer   . Cancer of central portion of left female breast ([place]) 06/01/2015  . Bilateral low back pain without sciatica 11/18/2014  . Right shoulder pain 04/10/2014  . Muscle spasm of back 01/13/2014  . Olecranon bursitis of left elbow 08/26/2013  . Posterolateral cervical muscle strain 12/14/2012  . Hyperlipidemia 02/16/2010  . TMJ PAIN 02/16/2010  . SEBORRHEIC KERATOSIS 08/24/2009  . Essential hypertension 10/18/2007  . OSTEOPENIA 07/12/2007  . Melanoma of skin ([place]) 10/17/2006  . BREAST CYST 10/17/2006  . COLONOSCOPY, HX OF 10/17/2006      Lanney Gins, PT, DPT 09/26/16 12:02 PM   [facility] [address]  [place], [place], [address] Phone: [phone]   Fax:  [phone]  Name: Jessica Mullins MRN: [id number] Date of Birth: January 17, 1931

## 2016-09-29 ENCOUNTER — Ambulatory Visit: Payer: Medicare PPO | Admitting: Physical Therapy

## 2016-09-30 ENCOUNTER — Ambulatory Visit: Payer: Medicare PPO | Admitting: Physical Therapy

## 2016-09-30 DIAGNOSIS — R2689 Other abnormalities of gait and mobility: Secondary | ICD-10-CM

## 2016-09-30 DIAGNOSIS — R2681 Unsteadiness on feet: Secondary | ICD-10-CM

## 2016-09-30 NOTE — Therapy (Signed)
[facility] [address]  [place], [place], [address] Phone: [phone]   Fax:  [phone]  Physical Therapy Treatment  Patient Details  Name: Jessica Mullins MRN: [id number] Date of Birth: [birth year] Referring Provider: Dr. Roma Schanz  Encounter Date: 09/30/2016      PT End of Session - 09/30/16 1223    Visit Number 8   Number of Visits 12   Date for PT Re-Evaluation 10/13/16   Authorization Type Medicare   PT Start Time 1014   PT Stop Time 1054   PT Time Calculation (min) 40 min   Activity Tolerance Patient tolerated treatment well   Behavior During Therapy [facility] for tasks assessed/performed      Past Medical History:  Diagnosis Date  . Allergy   . Ankle fracture, left   . Breast cancer ([place]) 1115/16   left   . Bursitis of right shoulder   . Cataract   . Family history of cancer   . Fracture of right wrist   . GERD (gastroesophageal reflux disease)   . Hyperlipidemia   . Hypertension   . Skin cancer 2000   melanoma and basal cell  . Vaso vagal episode    during preparation for colonoscopy    Past Surgical History:  Procedure Laterality Date  . ABDOMINAL HYSTERECTOMY  1988   TAH/BSO--FIBROIDS  . APPENDECTOMY    . BREAST LUMPECTOMY     B/L--FCS  . BREAST LUMPECTOMY WITH RADIOACTIVE SEED AND SENTINEL LYMPH NODE BIOPSY Left 06/11/2015   Procedure: LEFT BREAST LUMPECTOMY WITH RADIOACTIVE SEED AND LEFT SENTINEL LYMPH NODE MAPPING;  Surgeon: Erroll Luna, MD;  Location: [facility];  Service: General;  Laterality: Left;  . CATARACT EXTRACTION  2015  . COLONOSCOPY  2010  . DG  BONE DENSITY ([place] HX)    . EYE SURGERY Bilateral    cataracts  . fiberadenoma Bilateral 1978, 1980  . GANGLION CYST EXCISION     L  hand  . Lipiflow procedure      There were no vitals filed for this visit.      Subjective Assessment - 09/30/16 1222    Subjective has been feeling well - no falls   Patient  Stated Goals improve balance   Currently in Pain? No/denies   Multiple Pain Sites No                         [facility] Adult PT Treatment/Exercise - 09/30/16 0001      Neuro Re-ed    Neuro Re-ed Details  standing on foam - reaching and tossing bean bags x 12 each side; standing on foam with perturbations 3 x 30 seconds; ambualtion on red mat with pebbles placed under - forward, backwards and figure 8; ambulating in hallway - forward with horizontal and vertical head turns, cognitive tasks, eyes closed;              Balance Exercises - 09/30/16 1231      Balance Exercises: Standing   Tandem Stance Eyes open;2 reps;30 secs   SLS Eyes open;Solid surface;2 reps;30 secs   Sit to Stand Time sit to stand from low mat table standing on 1 AirEx x 15 reps                 PT Long Term Goals - 09/08/16 1009      PT LONG TERM GOAL #1   Title patient to be  independent with balance HEP (10/13/16)   Status On-going     PT LONG TERM GOAL #2   Title Patient to improve Berg to 52/56 demonstrating reduced fall risk (10/13/16)   Status On-going     PT LONG TERM GOAL #3   Title Patient to improve FGA to 25/30 deonstrating improved balance with gait and reduced fall risk (10/13/16)   Status On-going     PT LONG TERM GOAL #4   Title Patient to demonstrate good safety awareness with no reported falls (10/13/16)   Status On-going               Plan - 09/30/16 1224    Clinical Impression Statement Jessica Mullins doing well today - no falls or LOB within the home. Continued work with balance on compliant surfaces as well as narrow BOS. Will plan to establish HEP and prepare patient for discharge at upcoming visits.    PT Treatment/Interventions ADLs/Self Care Home Management;Canalith Repostioning;Cryotherapy;Electrical Stimulation;Moist Heat;Ultrasound;Neuromuscular re-education;Balance training;Therapeutic exercise;Therapeutic activities;Functional mobility training;Stair  training;Gait training;Patient/family education;Manual techniques;Visual/perceptual remediation/compensation;Vestibular;Taping   PT Next Visit Plan progress VOR/gaze stabilization, static/dynamic balance, review complaince/issues with OTAGO as needed   Consulted and Agree with Plan of Care Patient      Patient will benefit from skilled therapeutic intervention in order to improve the following deficits and impairments:  Decreased balance, Decreased safety awareness, Decreased mobility  Visit Diagnosis: Unsteadiness on feet  Other abnormalities of gait and mobility     Problem List Patient Active Problem List   Diagnosis Date Noted  . Atrial fibrillation ([place]) 08/10/2015  . Orthostatic hypotension 08/04/2015  . Leukocytosis 07/27/2015  . Genetic testing 07/27/2015  . Family history of cancer   . Cancer of central portion of left female breast ([place]) 06/01/2015  . Bilateral low back pain without sciatica 11/18/2014  . Right shoulder pain 04/10/2014  . Muscle spasm of back 01/13/2014  . Olecranon bursitis of left elbow 08/26/2013  . Posterolateral cervical muscle strain 12/14/2012  . Hyperlipidemia 02/16/2010  . TMJ PAIN 02/16/2010  . SEBORRHEIC KERATOSIS 08/24/2009  . Essential hypertension 10/18/2007  . OSTEOPENIA 07/12/2007  . Melanoma of skin ([place]) 10/17/2006  . BREAST CYST 10/17/2006  . COLONOSCOPY, HX OF 10/17/2006     Lanney Gins, PT, DPT 09/30/16 12:37 PM   [facility] [address]  [place], [place], [address] Phone: [phone]   Fax:  [phone]  Name: Jessica Mullins MRN: [id number] Date of Birth: 07/04/1930

## 2016-10-03 ENCOUNTER — Ambulatory Visit: Payer: Medicare PPO | Admitting: Physical Therapy

## 2016-10-03 DIAGNOSIS — R2681 Unsteadiness on feet: Secondary | ICD-10-CM | POA: Diagnosis not present

## 2016-10-03 DIAGNOSIS — R2689 Other abnormalities of gait and mobility: Secondary | ICD-10-CM | POA: Diagnosis not present

## 2016-10-03 NOTE — Therapy (Signed)
[facility] [address]  [place], [place], [address] Phone: [phone]   Fax:  [phone]  Physical Therapy Treatment  Patient Details  Name: Jessica Mullins MRN: [id number] Date of Birth: 1931/05/01 Referring Provider: Dr. Roma Schanz  Encounter Date: 10/03/2016      PT End of Session - 10/03/16 1320    Visit Number 9   Number of Visits 12   Date for PT Re-Evaluation 10/13/16   Authorization Type Medicare   PT Start Time 1103   PT Stop Time 1142   PT Time Calculation (min) 39 min   Activity Tolerance Patient tolerated treatment well   Behavior During Therapy [facility] for tasks assessed/performed      Past Medical History:  Diagnosis Date  . Allergy   . Ankle fracture, left   . Breast cancer ([place]) 1115/16   left   . Bursitis of right shoulder   . Cataract   . Family history of cancer   . Fracture of right wrist   . GERD (gastroesophageal reflux disease)   . Hyperlipidemia   . Hypertension   . Skin cancer 2000   melanoma and basal cell  . Vaso vagal episode    during preparation for colonoscopy    Past Surgical History:  Procedure Laterality Date  . ABDOMINAL HYSTERECTOMY  1988   TAH/BSO--FIBROIDS  . APPENDECTOMY    . BREAST LUMPECTOMY     B/L--FCS  . BREAST LUMPECTOMY WITH RADIOACTIVE SEED AND SENTINEL LYMPH NODE BIOPSY Left 06/11/2015   Procedure: LEFT BREAST LUMPECTOMY WITH RADIOACTIVE SEED AND LEFT SENTINEL LYMPH NODE MAPPING;  Surgeon: Erroll Luna, MD;  Location: [facility];  Service: General;  Laterality: Left;  . CATARACT EXTRACTION  2015  . COLONOSCOPY  2010  . DG  BONE DENSITY ([place] HX)    . EYE SURGERY Bilateral    cataracts  . fiberadenoma Bilateral 1978, 1980  . GANGLION CYST EXCISION     L  hand  . Lipiflow procedure      There were no vitals filed for this visit.      Subjective Assessment - 10/03/16 1316    Subjective feeling well - no falls   Pertinent History  breast cancer, A-fib, HTN   Patient Stated Goals improve balance            [facility] PT Assessment - 10/03/16 1106      Berg Balance Test   Sit to Stand Able to stand without using hands and stabilize independently   Standing Unsupported Able to stand safely 2 minutes   Sitting with Back Unsupported but Feet Supported on Floor or Stool Able to sit safely and securely 2 minutes   Stand to Sit Sits safely with minimal use of hands   Transfers Able to transfer safely, minor use of hands   Standing Unsupported with Eyes Closed Able to stand 10 seconds safely   Standing Ubsupported with Feet Together Able to place feet together independently and stand 1 minute safely   From Standing, Reach Forward with Outstretched Arm Can reach confidently >25 cm (10")   From Standing Position, Pick up Object from Floor Able to pick up shoe safely and easily   From Standing Position, Turn to Look Behind Over each Shoulder Looks behind from both sides and weight shifts well   Turn 360 Degrees Able to turn 360 degrees safely in 4 seconds or less   Standing Unsupported, Alternately Place Feet on Step/Stool  Able to stand independently and complete 8 steps >20 seconds   Standing Unsupported, One Foot in Front Needs help to step but can hold 15 seconds   Standing on One Leg Able to lift leg independently and hold equal to or more than 3 seconds   Total Score 50     Functional Gait  Assessment   Gait assessed  Yes   Gait Level Surface Walks 20 ft in less than 7 sec but greater than 5.5 sec, uses assistive device, slower speed, mild gait deviations, or deviates 6-10 in outside of the 12 in walkway width.   Change in Gait Speed Able to smoothly change walking speed without loss of balance or gait deviation. Deviate no more than 6 in outside of the 12 in walkway width.   Gait with Horizontal Head Turns Performs head turns smoothly with no change in gait. Deviates no more than 6 in outside 12 in walkway width   Gait  with Vertical Head Turns Performs head turns with no change in gait. Deviates no more than 6 in outside 12 in walkway width.   Gait and Pivot Turn Pivot turns safely in greater than 3 sec and stops with no loss of balance, or pivot turns safely within 3 sec and stops with mild imbalance, requires small steps to catch balance.   Step Over Obstacle Is able to step over one shoe box (4.5 in total height) but must slow down and adjust steps to clear box safely. May require verbal cueing.   Gait with Narrow Base of Support Ambulates less than 4 steps heel to toe or cannot perform without assistance.   Gait with Eyes Closed Walks 20 ft, slow speed, abnormal gait pattern, evidence for imbalance, deviates 10-15 in outside 12 in walkway width. Requires more than 9 sec to ambulate 20 ft.   Ambulating Backwards Walks 20 ft, uses assistive device, slower speed, mild gait deviations, deviates 6-10 in outside 12 in walkway width.   Steps Alternating feet, must use rail.   Total Score 19                     [facility] Adult PT Treatment/Exercise - 10/03/16 0001      Neuro Re-ed    Neuro Re-ed Details  alternating toe taps to 8" step - firm surface x 15, compliant surface x 15; ambulation with vertical and horizontal head turns; side stepping x 40 feet each way; sit to stand on foam x 15; double tapping cones with SL stance x 10 each; knocking down and sitting up cones with SL stance x 10 each;                      PT Long Term Goals - 10/03/16 1320      PT LONG TERM GOAL #1   Title patient to be independent with balance HEP (10/13/16)   Status On-going     PT LONG TERM GOAL #2   Title Patient to improve Berg to 52/56 demonstrating reduced fall risk (10/13/16)   Status On-going     PT LONG TERM GOAL #3   Title Patient to improve FGA to 25/30 deonstrating improved balance with gait and reduced fall risk (10/13/16)   Status On-going     PT LONG TERM GOAL #4   Title Patient to  demonstrate good safety awareness with no reported falls (10/13/16)   Status Achieved  Plan - 10/03/16 1320    Clinical Impression Statement Retesting of Berg and FGA today - with improvements seen in scoring. Continued difficulty with alternating stepping, tandem walking as well as tandem/SL stance. Patient reporting no falls as of recent with improved safety awareness within the home and community environment.    PT Treatment/Interventions ADLs/Self Care Home Management;Canalith Repostioning;Cryotherapy;Electrical Stimulation;Moist Heat;Ultrasound;Neuromuscular re-education;Balance training;Therapeutic exercise;Therapeutic activities;Functional mobility training;Stair training;Gait training;Patient/family education;Manual techniques;Visual/perceptual remediation/compensation;Vestibular;Taping   PT Next Visit Plan progress VOR/gaze stabilization, static/dynamic balance, review complaince/issues with OTAGO as needed; possible d/c   Consulted and Agree with Plan of Care Patient      Patient will benefit from skilled therapeutic intervention in order to improve the following deficits and impairments:  Decreased balance, Decreased safety awareness, Decreased mobility  Visit Diagnosis: Unsteadiness on feet  Other abnormalities of gait and mobility     Problem List Patient Active Problem List   Diagnosis Date Noted  . Atrial fibrillation ([place]) 08/10/2015  . Orthostatic hypotension 08/04/2015  . Leukocytosis 07/27/2015  . Genetic testing 07/27/2015  . Family history of cancer   . Cancer of central portion of left female breast ([place]) 06/01/2015  . Bilateral low back pain without sciatica 11/18/2014  . Right shoulder pain 04/10/2014  . Muscle spasm of back 01/13/2014  . Olecranon bursitis of left elbow 08/26/2013  . Posterolateral cervical muscle strain 12/14/2012  . Hyperlipidemia 02/16/2010  . TMJ PAIN 02/16/2010  . SEBORRHEIC KERATOSIS 08/24/2009  . Essential  hypertension 10/18/2007  . OSTEOPENIA 07/12/2007  . Melanoma of skin ([place]) 10/17/2006  . BREAST CYST 10/17/2006  . COLONOSCOPY, HX OF 10/17/2006     Lanney Gins, PT, DPT 10/03/16 1:26 PM   [facility] [address]  [place], [place], [address] Phone: [phone]   Fax:  [phone]  Name: Jessica Mullins MRN: [id number] Date of Birth: 1931/04/13

## 2016-10-04 DIAGNOSIS — Z08 Encounter for follow-up examination after completed treatment for malignant neoplasm: Secondary | ICD-10-CM | POA: Diagnosis not present

## 2016-10-04 DIAGNOSIS — Z85820 Personal history of malignant melanoma of skin: Secondary | ICD-10-CM | POA: Diagnosis not present

## 2016-10-04 DIAGNOSIS — Z1283 Encounter for screening for malignant neoplasm of skin: Secondary | ICD-10-CM | POA: Diagnosis not present

## 2016-10-04 DIAGNOSIS — B373 Candidiasis of vulva and vagina: Secondary | ICD-10-CM | POA: Diagnosis not present

## 2016-10-04 DIAGNOSIS — L821 Other seborrheic keratosis: Secondary | ICD-10-CM | POA: Diagnosis not present

## 2016-10-06 ENCOUNTER — Ambulatory Visit: Payer: Medicare PPO | Admitting: Physical Therapy

## 2016-10-06 DIAGNOSIS — R2689 Other abnormalities of gait and mobility: Secondary | ICD-10-CM | POA: Diagnosis not present

## 2016-10-06 DIAGNOSIS — R2681 Unsteadiness on feet: Secondary | ICD-10-CM | POA: Diagnosis not present

## 2016-10-06 NOTE — Therapy (Signed)
[facility] [address]  [place], [place], [address] Phone: [phone]   Fax:  [phone]  Physical Therapy Treatment  Patient Details  Name: Jessica Mullins MRN: [id number] Date of Birth: 28-Feb-1931 Referring Provider: Dr. Roma Schanz  Encounter Date: 10/06/2016      PT End of Session - 10/06/16 1017    Visit Number 10   Number of Visits 12   Date for PT Re-Evaluation 10/13/16   Authorization Type Medicare   PT Start Time 1017   PT Stop Time 1059   PT Time Calculation (min) 42 min   Activity Tolerance Patient tolerated treatment well   Behavior During Therapy [facility] for tasks assessed/performed      Past Medical History:  Diagnosis Date  . Allergy   . Ankle fracture, left   . Breast cancer ([place]) 1115/16   left   . Bursitis of right shoulder   . Cataract   . Family history of cancer   . Fracture of right wrist   . GERD (gastroesophageal reflux disease)   . Hyperlipidemia   . Hypertension   . Skin cancer 2000   melanoma and basal cell  . Vaso vagal episode    during preparation for colonoscopy    Past Surgical History:  Procedure Laterality Date  . ABDOMINAL HYSTERECTOMY  1988   TAH/BSO--FIBROIDS  . APPENDECTOMY    . BREAST LUMPECTOMY     B/L--FCS  . BREAST LUMPECTOMY WITH RADIOACTIVE SEED AND SENTINEL LYMPH NODE BIOPSY Left 06/11/2015   Procedure: LEFT BREAST LUMPECTOMY WITH RADIOACTIVE SEED AND LEFT SENTINEL LYMPH NODE MAPPING;  Surgeon: Erroll Luna, MD;  Location: [facility];  Service: General;  Laterality: Left;  . CATARACT EXTRACTION  2015  . COLONOSCOPY  2010  . DG  BONE DENSITY ([place] HX)    . EYE SURGERY Bilateral    cataracts  . fiberadenoma Bilateral 1978, 1980  . GANGLION CYST EXCISION     L  hand  . Lipiflow procedure      There were no vitals filed for this visit.      Subjective Assessment - 10/06/16 1020    Subjective Pt feels like she is stronger - able to get up  and down with greater ease. Denies any recent falls. Still notes hesistation with up/down curbs. Completing OTAGO program every other day.   Pertinent History breast cancer, A-fib, HTN   Patient Stated Goals improve balance   Currently in Pain? No/denies            [facility] PT Assessment - 10/06/16 1017      Assessment   Medical Diagnosis Falls   Referring Provider Dr. Garnet Koyanagi Chase     Observation/Other Assessments   Focus on Therapeutic Outcomes (FOTO)  Neuro: 80% (20% limited)     Strength   Overall Strength Comments grossly 4+/5 B LE -  no pain     Functional Gait  Assessment   Gait Level Surface Walks 20 ft in less than 5.5 sec, no assistive devices, good speed, no evidence for imbalance, normal gait pattern, deviates no more than 6 in outside of the 12 in walkway width.   Change in Gait Speed Able to smoothly change walking speed without loss of balance or gait deviation. Deviate no more than 6 in outside of the 12 in walkway width.   Gait with Horizontal Head Turns Performs head turns smoothly with no change in gait. Deviates no more than  6 in outside 12 in walkway width   Gait with Vertical Head Turns Performs head turns with no change in gait. Deviates no more than 6 in outside 12 in walkway width.   Gait and Pivot Turn Pivot turns safely within 3 sec and stops quickly with no loss of balance.   Step Over Obstacle Is able to step over one shoe box (4.5 in total height) without changing gait speed. No evidence of imbalance.   Gait with Narrow Base of Support Ambulates 7-9 steps.   Gait with Eyes Closed Walks 20 ft, slow speed, abnormal gait pattern, evidence for imbalance, deviates 10-15 in outside 12 in walkway width. Requires more than 9 sec to ambulate 20 ft.   Ambulating Backwards Walks 20 ft, uses assistive device, slower speed, mild gait deviations, deviates 6-10 in outside 12 in walkway width.   Steps Alternating feet, must use rail.   Total Score 24                      [facility] Adult PT Treatment/Exercise - 10/06/16 1017      Self-Care   Self-Care Other Self-Care Comments   Other Self-Care Comments  Community resources for continued exercise and fall prevention programs             Balance Exercises - 10/06/16 1038      OTAGO PROGRAM   Ankle Plantorflexors 20 reps, no support   Ankle Dorsiflexors 20 reps, no support   Knee Bends 20 reps, no support   Backwards Walking No support   Walking and Turning Around No assistive device   Sideways Walking No assistive device   Tandem Stance 10 seconds, no support   Tandem Walk Support   One Leg Stand 10 seconds, support  ~5 sec w/o support   Heel Walking No support   Toe Walk No support   Heel Toe Walking Backward --  intermittent UE support   Sit to Stand 20 reps, no support           PT Education - 10/06/16 1059    Education provided Yes   Education Details Pt provided with list of community resources for continued exercise and fall prevention programs   Person(s) Educated Patient   Methods Explanation;Handout   Comprehension Verbalized understanding             PT Long Term Goals - 10/06/16 1030      PT LONG TERM GOAL #1   Title patient to be independent with balance HEP (10/13/16)   Status Achieved     PT LONG TERM GOAL #2   Title Patient to improve Berg to 52/56 demonstrating reduced fall risk (10/13/16)   Status Not Met  Berg = 50/56     PT LONG TERM GOAL #3   Title Patient to improve FGA to 25/30 deonstrating improved balance with gait and reduced fall risk (10/13/16)   Status Not Met  FGA = 24/30     PT LONG TERM GOAL #4   Title Patient to demonstrate good safety awareness with no reported falls (10/13/16)   Status Achieved               Plan - 10/06/16 1100    Clinical Impression Statement Reviewed balance portion of OTAGO fall prevention program to ensure correct performance of activities. Based on performance during review  of OTAGO, reassessed FGA with significant improvement noted in score. Pt notes benefit from PT with improving functional strength and stability.  Denies any recent falls or episodes of imbalance. Pt feels confident to continue on own with HEP and plans to join [employer] type of program in the community (list of local programs provided to pt). Goals only partially met due to both standardized balance tests 1 point shy of goal.   PT Treatment/Interventions ADLs/Self Care Home Management;Canalith Repostioning;Cryotherapy;Electrical Stimulation;Moist Heat;Ultrasound;Neuromuscular re-education;Balance training;Therapeutic exercise;Therapeutic activities;Functional mobility training;Stair training;Gait training;Patient/family education;Manual techniques;Visual/perceptual remediation/compensation;Vestibular;Taping   PT Next Visit Plan discharge   Consulted and Agree with Plan of Care Patient      Patient will benefit from skilled therapeutic intervention in order to improve the following deficits and impairments:  Decreased balance, Decreased safety awareness, Decreased mobility  Visit Diagnosis: Unsteadiness on feet  Other abnormalities of gait and mobility       G-Codes - October 21, 2016 1023    Functional Assessment Tool Used (Outpatient Only) Neuro FOTO: 80% (20% limited)   Functional Limitation Mobility: Walking and moving around   Mobility: Walking and Moving Around Goal Status [phone]) At least 20 percent but less than 40 percent impaired, limited or restricted   Mobility: Walking and Moving Around Discharge Status [phone]) At least 20 percent but less than 40 percent impaired, limited or restricted      Problem List Patient Active Problem List   Diagnosis Date Noted  . Atrial fibrillation ([place]) 08/10/2015  . Orthostatic hypotension 08/04/2015  . Leukocytosis 07/27/2015  . Genetic testing 07/27/2015  . Family history of cancer   . Cancer of central portion of left female breast ([place])  06/01/2015  . Bilateral low back pain without sciatica 11/18/2014  . Right shoulder pain 04/10/2014  . Muscle spasm of back 01/13/2014  . Olecranon bursitis of left elbow 08/26/2013  . Posterolateral cervical muscle strain 12/14/2012  . Hyperlipidemia 02/16/2010  . TMJ PAIN 02/16/2010  . SEBORRHEIC KERATOSIS 08/24/2009  . Essential hypertension 10/18/2007  . OSTEOPENIA 07/12/2007  . Melanoma of skin ([place]) 10/17/2006  . BREAST CYST 10/17/2006  . COLONOSCOPY, HX OF 10/17/2006    PHYSICAL THERAPY DISCHARGE SUMMARY  Visits from Start of Care: 10  Current functional level related to goals / functional outcomes:   Refer to above clinical impression & goal assessment.   Remaining deficits:   As above.   Education / Equipment:   HEP, [employer], Community resources for continued exercise & fall prevention  Plan: Patient agrees to discharge.  Patient goals were partially met. Patient is being discharged due to being pleased with the current functional level.  ?????      Percival Spanish 21-Oct-2016, 6:06 PM  [facility] [address]  [place], [place], [address] Phone: [phone]   Fax:  [phone]  Name: Jessica Mullins MRN: [id number] Date of Birth: 1931/02/28

## 2016-10-10 ENCOUNTER — Ambulatory Visit: Payer: Medicare PPO | Admitting: Physical Therapy

## 2016-10-13 ENCOUNTER — Ambulatory Visit: Payer: Medicare PPO | Admitting: Physical Therapy

## 2016-10-31 ENCOUNTER — Other Ambulatory Visit: Payer: Self-pay | Admitting: Family Medicine

## 2016-11-07 ENCOUNTER — Encounter (HOSPITAL_BASED_OUTPATIENT_CLINIC_OR_DEPARTMENT_OTHER): Payer: Self-pay | Admitting: *Deleted

## 2016-11-07 ENCOUNTER — Emergency Department (HOSPITAL_BASED_OUTPATIENT_CLINIC_OR_DEPARTMENT_OTHER)
Admission: EM | Admit: 2016-11-07 | Discharge: 2016-11-07 | Disposition: A | Discharge status: Home / Self Care | Payer: Medicare PPO | Attending: Emergency Medicine | Admitting: Emergency Medicine

## 2016-11-07 ENCOUNTER — Emergency Department (HOSPITAL_BASED_OUTPATIENT_CLINIC_OR_DEPARTMENT_OTHER): Payer: Medicare PPO

## 2016-11-07 DIAGNOSIS — I10 Essential (primary) hypertension: Secondary | ICD-10-CM | POA: Diagnosis not present

## 2016-11-07 DIAGNOSIS — I480 Paroxysmal atrial fibrillation: Secondary | ICD-10-CM | POA: Insufficient documentation

## 2016-11-07 DIAGNOSIS — Z79899 Other long term (current) drug therapy: Secondary | ICD-10-CM | POA: Insufficient documentation

## 2016-11-07 DIAGNOSIS — R531 Weakness: Secondary | ICD-10-CM | POA: Diagnosis not present

## 2016-11-07 DIAGNOSIS — Z85828 Personal history of other malignant neoplasm of skin: Secondary | ICD-10-CM | POA: Diagnosis not present

## 2016-11-07 DIAGNOSIS — Z853 Personal history of malignant neoplasm of breast: Secondary | ICD-10-CM | POA: Insufficient documentation

## 2016-11-07 DIAGNOSIS — R05 Cough: Secondary | ICD-10-CM | POA: Diagnosis not present

## 2016-11-07 LAB — CBC WITH DIFFERENTIAL/PLATELET
Basophils Absolute: 0 10*3/uL (ref 0.0–0.1)
Basophils Relative: 0 %
Eosinophils Absolute: 0 10*3/uL (ref 0.0–0.7)
Eosinophils Relative: 0 %
HCT: 42 % (ref 36.0–46.0)
Hemoglobin: 15.1 g/dL — ABNORMAL HIGH (ref 12.0–15.0)
Lymphocytes Relative: 13 %
Lymphs Abs: 0.8 10*3/uL (ref 0.7–4.0)
MCH: 31.2 pg (ref 26.0–34.0)
MCHC: 36 g/dL (ref 30.0–36.0)
MCV: 86.8 fL (ref 78.0–100.0)
Monocytes Absolute: 0.6 10*3/uL (ref 0.1–1.0)
Monocytes Relative: 9 %
Neutro Abs: 4.9 10*3/uL (ref 1.7–7.7)
Neutrophils Relative %: 78 %
Platelets: 163 10*3/uL (ref 150–400)
RBC: 4.84 MIL/uL (ref 3.87–5.11)
RDW: 14.1 % (ref 11.5–15.5)
WBC: 6.3 10*3/uL (ref 4.0–10.5)

## 2016-11-07 LAB — COMPREHENSIVE METABOLIC PANEL
ALT: 21 U/L (ref 14–54)
AST: 27 U/L (ref 15–41)
Albumin: 3.4 g/dL — ABNORMAL LOW (ref 3.5–5.0)
Alkaline Phosphatase: 53 U/L (ref 38–126)
Anion gap: 10 (ref 5–15)
BUN: 13 mg/dL (ref 6–20)
CO2: 27 mmol/L (ref 22–32)
Calcium: 9 mg/dL (ref 8.9–10.3)
Chloride: 99 mmol/L — ABNORMAL LOW (ref 101–111)
Creatinine, Ser: 1.01 mg/dL — ABNORMAL HIGH (ref 0.44–1.00)
GFR calc Af Amer: 58 mL/min — ABNORMAL LOW (ref >60)
GFR calc non Af Amer: 50 mL/min — ABNORMAL LOW (ref >60)
Glucose, Bld: 125 mg/dL — ABNORMAL HIGH (ref 65–99)
Potassium: 3.3 mmol/L — ABNORMAL LOW (ref 3.5–5.1)
Sodium: 136 mmol/L (ref 135–145)
Total Bilirubin: 0.7 mg/dL (ref 0.3–1.2)
Total Protein: 6.2 g/dL — ABNORMAL LOW (ref 6.5–8.1)

## 2016-11-07 LAB — BRAIN NATRIURETIC PEPTIDE: B Natriuretic Peptide: 244.6 pg/mL — ABNORMAL HIGH (ref 0.0–100.0)

## 2016-11-07 LAB — TROPONIN I: Troponin I: <0.03 ng/mL (ref <0.03)

## 2016-11-07 LAB — TSH: TSH: 3.49 u[IU]/mL (ref 0.350–4.500)

## 2016-11-07 MED ORDER — SODIUM CHLORIDE 0.9 % IV BOLUS (SEPSIS)
500.0000 mL | Freq: Once | INTRAVENOUS | Status: AC
Start: 2016-11-07 — End: 2016-11-07
  Administered 2016-11-07: 500 mL via INTRAVENOUS

## 2016-11-07 MED ORDER — SODIUM CHLORIDE 0.9 % IV BOLUS (SEPSIS)
500.0000 mL | Freq: Once | INTRAVENOUS | Status: AC
  Administered 2016-11-07: 500 mL via INTRAVENOUS

## 2016-11-07 NOTE — ED Notes (Signed)
ED Provider at bedside. 

## 2016-11-07 NOTE — ED Triage Notes (Signed)
Pt reports lower leg weakness (bilateral) and feeling funny since Saturday. Voices concern for stroke. Grips strong and equal, facial symmetry present, no drift, speech clear. Pt states she takes eliquis and flecainide for a-fib

## 2016-11-07 NOTE — ED Notes (Signed)
Pt states she has not had her am meds (eliquis, flecainide, and cardizem). Dr. Stark Jock made aware

## 2016-11-07 NOTE — Discharge Instructions (Signed)
Continue your medications as before.  The atrial fibrillation clinic will call you today or tomorrow to arrange a follow-up appointment.  Return to the emergency department if your symptoms significantly worsen or change.

## 2016-11-07 NOTE — ED Provider Notes (Signed)
[facility] DEPT [facility] Provider Note   CSN: [id number] Arrival date & time: 11/07/16  0806     History   Chief Complaint Chief Complaint  Patient presents with  . Extremity Weakness    HPI Jessica Mullins is a 83 y.o. female.  Patient is an 83 year old female with past medical history of hypertension, atrial fibrillation, and breast cancer. She presents today for evaluation of weakness. For the past 2 days, she feels as though her legs are "made of rubber". She reports when she tries to stand and walk her legs will not hold her. She denies any specific complaints such as chest pain or difficulty breathing, but does report having a cough recently. She denies any fevers or chills. She denies any dark or tarry stools.   The history is provided by the patient.  Extremity Weakness  This is a new problem. The current episode started 2 days ago. The problem occurs constantly. The problem has been gradually worsening. Pertinent negatives include no chest pain, no abdominal pain and no shortness of breath. Exacerbated by: Standing. Nothing relieves the symptoms. She has tried nothing for the symptoms.    Past Medical History:  Diagnosis Date  . Allergy   . Ankle fracture, left   . Breast cancer ([place]) 1115/16   left   . Bursitis of right shoulder   . Cataract   . Family history of cancer   . Fracture of right wrist   . GERD (gastroesophageal reflux disease)   . Hyperlipidemia   . Hypertension   . Skin cancer 2000   melanoma and basal cell  . Vaso vagal episode    during preparation for colonoscopy    Patient Active Problem List   Diagnosis Date Noted  . Atrial fibrillation ([place]) 08/10/2015  . Orthostatic hypotension 08/04/2015  . Leukocytosis 07/27/2015  . Genetic testing 07/27/2015  . Family history of cancer   . Cancer of central portion of left female breast ([place]) 06/01/2015  . Bilateral low back pain without sciatica 11/18/2014  . Right shoulder pain 04/10/2014  .  Muscle spasm of back 01/13/2014  . Olecranon bursitis of left elbow 08/26/2013  . Posterolateral cervical muscle strain 12/14/2012  . Hyperlipidemia 02/16/2010  . TMJ PAIN 02/16/2010  . SEBORRHEIC KERATOSIS 08/24/2009  . Essential hypertension 10/18/2007  . OSTEOPENIA 07/12/2007  . Melanoma of skin ([place]) 10/17/2006  . BREAST CYST 10/17/2006  . COLONOSCOPY, HX OF 10/17/2006    Past Surgical History:  Procedure Laterality Date  . ABDOMINAL HYSTERECTOMY  1988   TAH/BSO--FIBROIDS  . APPENDECTOMY    . BREAST LUMPECTOMY     B/L--[facility]CS  . BREAST LUMPECTOMY WITH RADIOACTIVE SEED AND SENTINEL LYMPH NODE BIOPSY Left 06/11/2015   Procedure: LEFT BREAST LUMPECTOMY WITH RADIOACTIVE SEED AND LEFT SENTINEL LYMPH NODE MAPPING;  Surgeon: Erroll Luna, MD;  Location: [facility];  Service: General;  Laterality: Left;  . CATARACT EXTRACTION  2015  . COLONOSCOPY  2010  . DG  BONE DENSITY ([facility] HX)    . EYE SURGERY Bilateral    cataracts  . fiberadenoma Bilateral 1978, 1980  . GANGLION CYST EXCISION     L  hand  . Lipiflow procedure      OB History    No data available       Home Medications    Prior to Admission medications   Medication Sig Start Date End Date Taking? Authorizing Provider  alendronate (FOSAMAX) 70 MG tablet TAKE 1 TABLET (70 MG TOTAL) BY MOUTH  EVERY SEVEN DAYS. TAKE WITH A FULL GLASS OF WATER AT NOON TIME ON EMPTY STOMACH. 10/31/16   Carollee Herter, Alferd Apa, DO  Biotin (BIOTIN 5000) 5 MG CAPS Take 5 mg by mouth daily.     [provider]  Calcium Carbonate-Vitamin D (CALTRATE 600+D) 600-400 MG-UNIT per tablet Take 1 tablet by mouth daily.     [provider]  Cholecalciferol (VITAMIN D3) 1000 UNITS CAPS Take 1 capsule by mouth daily.      [provider]  cycloSPORINE (RESTASIS) 0.05 % ophthalmic emulsion Apply 1 drop to eye 2 (two) times daily. 09/28/15   [provider]  diltiazem (CARDIZEM CD) 180 MG 24 hr capsule TAKE 1 CAPSULE (180 MG TOTAL)  BY MOUTH DAILY. 09/23/16   Bensimhon, Shaune Pascal, MD  docusate sodium (COLACE) 100 MG capsule Take 100 mg by mouth 2 (two) times daily as needed for mild constipation.    [provider]  ELIQUIS 5 MG TABS tablet TAKE 1 TABLET (5 MG TOTAL) BY MOUTH TWO TIMES DAILY. 09/27/16   Nicholas Lose, MD  flecainide (TAMBOCOR) 100 MG tablet Take 50 mg by mouth 2 (two) times daily.    [provider]  loratadine (CLARITIN) 10 MG tablet Take 10 mg by mouth daily.    [provider]  nystatin (MYCOSTATIN/NYSTOP) powder Apply topically 3 (three) times daily. 03/16/16   Hayden Pedro, PA-C  Omega-3 Fatty Acids (FISH OIL PO) Take 1 tablet by mouth daily.     [provider]  pravastatin (PRAVACHOL) 40 MG tablet Take 1 tablet (40 mg total) by mouth daily. Repeat labs are due now 08/29/16   Carollee Herter, Kendrick Fries R, DO  pravastatin (PRAVACHOL) 40 MG tablet TAKE 1 TABLET (40 MG TOTAL) BY MOUTH DAILY. REPEAT LABS ARE DUE NOW 09/26/16   Carollee Herter, Alferd Apa, DO  Propylene Glycol 0.6 % SOLN Apply 1 drop to eye daily as needed (dry eyes).     [provider]  Psyllium (METAMUCIL PO) Take 15 mLs by mouth daily.     [provider]  Turmeric 500 MG CAPS Take 1 capsule by mouth daily.     [provider]    Family History Family History  Problem Relation Age of Onset  . Cancer Mother        mets to bone--? primary  . Coronary artery disease Brother        died of M! @ 64  . Diabetes Brother   . Hyperlipidemia Brother   . Hypertension Brother   . Heart disease Brother        chf  . Heart disease Father 14       MI  . Lung cancer Sister 46       former smoker  . Diabetes Maternal Aunt   . Lung cancer Unknown   . Thrombosis Unknown        thromboembolism clotting disorder--? father died of ? clot     Social History Social History  Substance Use Topics  . Smoking status: Never Smoker  . Smokeless tobacco: Never Used  . Alcohol use No      Allergies   Sulfa antibiotics; Tape; Glucosamine forte [nutritional supplements]; and Latex   Review of Systems Review of Systems  Respiratory: Negative for shortness of breath.   Cardiovascular: Negative for chest pain.  Gastrointestinal: Negative for abdominal pain.  Musculoskeletal: Positive for extremity weakness.  All other systems reviewed and are negative.    Physical  Exam Updated Vital Signs BP (!) 100/57 (BP Location: Right Arm)   Pulse 76   Temp 98.6 F (37 C) (Oral)   SpO2 92%   Physical Exam  Constitutional: She is oriented to person, place, and time. She appears well-developed and well-nourished. No distress.  HENT:  Head: Normocephalic and atraumatic.  Mouth/Throat: Oropharynx is clear and moist.  Eyes: EOM are normal. Pupils are equal, round, and reactive to light.  Neck: Normal range of motion. Neck supple.  Cardiovascular: Normal rate.  Exam reveals no gallop and no friction rub.   No murmur heard. Heart is irregularly irregular.  Pulmonary/Chest: Effort normal and breath sounds normal. No respiratory distress. She has no wheezes.  Abdominal: Soft. Bowel sounds are normal. She exhibits no distension. There is no tenderness.  Musculoskeletal: Normal range of motion.  Neurological: She is alert and oriented to person, place, and time. No cranial nerve deficit. She exhibits normal muscle tone. Coordination normal.  Skin: Skin is warm and dry. She is not diaphoretic. There is pallor.  Nursing note and vitals reviewed.    ED Treatments / Results  Labs (all labs ordered are listed, but only abnormal results are displayed) Labs Reviewed  COMPREHENSIVE METABOLIC PANEL  TROPONIN I  CBC WITH DIFFERENTIAL/PLATELET  TSH  BRAIN NATRIURETIC PEPTIDE    EKG  EKG Interpretation  Date/Time:  Monday Nov 07 2016 08:23:41 EDT Ventricular Rate:  79 PR Interval:    QRS Duration: 92 QT Interval:  393 QTC Calculation: 451 R Axis:   -32 Text  Interpretation:  Atrial fibrillation Left axis deviation Borderline repolarization abnormality Baseline wander in lead(s) II III aVR aVL aVF V1 V2 Confirmed by Ila Landowski  MD, Ayan Yankey ([id number]) on 11/07/2016 8:51:46 AM      Repeat EKG  ED ECG REPORT   Date: 11/07/2016  Rate: 82  Rhythm: normal sinus rhythm  QRS Axis: normal  Intervals: normal  ST/T Wave abnormalities: normal  Conduction Disutrbances:none  Narrative Interpretation:   Old EKG Reviewed: Sinus Rhythm has replaced Afib.  I have personally reviewed the EKG tracing and agree with the computerized printout as noted.   Radiology No results found.  Procedures Procedures (including critical care time)  Medications Ordered in ED Medications  sodium chloride 0.9 % bolus 500 mL (not administered)     Initial Impression / Assessment and Plan / ED Course  I have reviewed the triage vital signs and the nursing notes.  Pertinent labs & imaging results that were available during my care of the patient were reviewed by me and considered in my medical decision making (see chart for details).  Patient presents here with a 2 day history of weakness in her legs, and decreased energy. She arrived here and atrial fibrillation with mild hypotension. She was gently hydrated. Workup reveals no significant abnormality. She does have a mildly low potassium which will be replaced orally.  I've discussed the case with Dr. Sallyanne Kuster from cardiology. The plan was for her to follow-up in A. fib clinic to undergo cardioversion. When I returned the patient's room to inform her of the disposition and plan, she had already reverted back to a sinus rhythm and her blood pressure is now in the 120s.  I suspect this paroxysmal atrial fibrillation to be the cause of her weakness. She will be discharged, to follow-up in the A. fib clinic to discuss medication changes and further management.  Final Clinical Impressions(s) / ED Diagnoses   Final diagnoses:   None  New Prescriptions New Prescriptions   No medications on file     Veryl Speak, MD 11/07/16 1328

## 2016-11-08 ENCOUNTER — Emergency Department (HOSPITAL_COMMUNITY)
Admission: EM | Admit: 2016-11-08 | Discharge: 2016-11-08 | Disposition: A | Discharge status: Home / Self Care | Payer: Medicare PPO | Attending: Emergency Medicine | Admitting: Emergency Medicine

## 2016-11-08 ENCOUNTER — Encounter (HOSPITAL_COMMUNITY): Payer: Self-pay

## 2016-11-08 DIAGNOSIS — I10 Essential (primary) hypertension: Secondary | ICD-10-CM | POA: Diagnosis not present

## 2016-11-08 DIAGNOSIS — Z7901 Long term (current) use of anticoagulants: Secondary | ICD-10-CM | POA: Diagnosis not present

## 2016-11-08 DIAGNOSIS — Z91040 Latex allergy status: Secondary | ICD-10-CM | POA: Diagnosis not present

## 2016-11-08 DIAGNOSIS — Z85820 Personal history of malignant melanoma of skin: Secondary | ICD-10-CM | POA: Diagnosis not present

## 2016-11-08 DIAGNOSIS — R05 Cough: Secondary | ICD-10-CM | POA: Diagnosis not present

## 2016-11-08 DIAGNOSIS — Z79899 Other long term (current) drug therapy: Secondary | ICD-10-CM | POA: Diagnosis not present

## 2016-11-08 DIAGNOSIS — Z853 Personal history of malignant neoplasm of breast: Secondary | ICD-10-CM | POA: Insufficient documentation

## 2016-11-08 DIAGNOSIS — J40 Bronchitis, not specified as acute or chronic: Secondary | ICD-10-CM | POA: Diagnosis not present

## 2016-11-08 DIAGNOSIS — R0602 Shortness of breath: Secondary | ICD-10-CM | POA: Diagnosis not present

## 2016-11-08 MED ORDER — ALBUTEROL SULFATE HFA 108 (90 BASE) MCG/ACT IN AERS
2.0000 | INHALATION_SPRAY | RESPIRATORY_TRACT | Status: DC | PRN
  Administered 2016-11-08: 2 via RESPIRATORY_TRACT
  Filled 2016-11-08: qty 6.7

## 2016-11-08 MED ORDER — DOXYCYCLINE HYCLATE 100 MG PO TABS
100.0000 mg | ORAL_TABLET | Freq: Once | ORAL | Status: AC
  Administered 2016-11-08: 100 mg via ORAL
  Filled 2016-11-08: qty 1

## 2016-11-08 MED ORDER — AEROCHAMBER Z-STAT PLUS/MEDIUM MISC
1.0000 | Freq: Once | Status: AC
  Administered 2016-11-08: 1
  Filled 2016-11-08: qty 1

## 2016-11-08 MED ORDER — PREDNISONE 20 MG PO TABS
60.0000 mg | ORAL_TABLET | Freq: Once | ORAL | Status: AC
  Administered 2016-11-08: 60 mg via ORAL
  Filled 2016-11-08: qty 3

## 2016-11-08 MED ORDER — DOXYCYCLINE HYCLATE 100 MG PO CAPS: 100.0000 mg | ORAL_CAPSULE | Freq: Two times a day (BID) | ORAL | 0 refills | Status: DC

## 2016-11-08 MED ORDER — PREDNISONE 20 MG PO TABS: 20.0000 mg | ORAL_TABLET | Freq: Two times a day (BID) | ORAL | 0 refills | Status: DC

## 2016-11-08 MED ORDER — IPRATROPIUM-ALBUTEROL 0.5-2.5 (3) MG/3ML IN SOLN
3.0000 mL | Freq: Once | RESPIRATORY_TRACT | Status: AC
  Administered 2016-11-08: 3 mL via RESPIRATORY_TRACT
  Filled 2016-11-08: qty 3

## 2016-11-08 NOTE — ED Provider Notes (Signed)
[facility] DEPT Provider Note   CSN: [id number] Arrival date & time: 11/08/16  1330     History   Chief Complaint Chief Complaint  Patient presents with  . Cough    HPI Jessica Mullins is a 83 y.o. female.  She is here for evaluation of a persistent cough for several days, occasionally productive of opaque white sputum.  She denies fever, persistent chest pain, nausea, vomiting, diaphoresis, weakness or dizziness.  She was evaluated yesterday, had an affiliate emergency department, for the same problem.  She was found to be in atrial fibrillation, which was said to have converted spontaneously.  She has a follow-up appointment tomorrow, with the atrial fibrillation clinic.  She does not have any history of environmental allergies, asthma, COPD or recent pneumonia.  She has been eating well.  She came here by EMS.  She is taking her usual medications.  There are no other known modifying factors.    HPI  Past Medical History:  Diagnosis Date  . Allergy   . Ankle fracture, left   . Breast cancer ([facility]) 1115/16   left   . Bursitis of right shoulder   . Cataract   . Family history of cancer   . Fracture of right wrist   . GERD (gastroesophageal reflux disease)   . Hyperlipidemia   . Hypertension   . Skin cancer 2000   melanoma and basal cell  . Vaso vagal episode    during preparation for colonoscopy    Patient Active Problem List   Diagnosis Date Noted  . Atrial fibrillation ([place]) 08/10/2015  . Orthostatic hypotension 08/04/2015  . Leukocytosis 07/27/2015  . Genetic testing 07/27/2015  . Family history of cancer   . Cancer of central portion of left female breast ([place]) 06/01/2015  . Bilateral low back pain without sciatica 11/18/2014  . Right shoulder pain 04/10/2014  . Muscle spasm of back 01/13/2014  . Olecranon bursitis of left elbow 08/26/2013  . Posterolateral cervical muscle strain 12/14/2012  . Hyperlipidemia 02/16/2010  . TMJ PAIN 02/16/2010  .  SEBORRHEIC KERATOSIS 08/24/2009  . Essential hypertension 10/18/2007  . OSTEOPENIA 07/12/2007  . Melanoma of skin ([place]) 10/17/2006  . BREAST CYST 10/17/2006  . COLONOSCOPY, HX OF 10/17/2006    Past Surgical History:  Procedure Laterality Date  . ABDOMINAL HYSTERECTOMY  1988   TAH/BSO--FIBROIDS  . APPENDECTOMY    . BREAST LUMPECTOMY     B/L--FCS  . BREAST LUMPECTOMY WITH RADIOACTIVE SEED AND SENTINEL LYMPH NODE BIOPSY Left 06/11/2015   Procedure: LEFT BREAST LUMPECTOMY WITH RADIOACTIVE SEED AND LEFT SENTINEL LYMPH NODE MAPPING;  Surgeon: Erroll Luna, MD;  Location: [facility];  Service: General;  Laterality: Left;  . CATARACT EXTRACTION  2015  . COLONOSCOPY  2010  . DG  BONE DENSITY ([facility] HX)    . EYE SURGERY Bilateral    cataracts  . fiberadenoma Bilateral 1978, 1980  . GANGLION CYST EXCISION     L  hand  . Lipiflow procedure      OB History    No data available       Home Medications    Prior to Admission medications   Medication Sig Start Date End Date Taking? Authorizing Provider  Biotin (BIOTIN 5000) 5 MG CAPS Take 5 mg by mouth daily.    Yes [provider]  Calcium Carbonate-Vitamin D (CALTRATE 600+D) 600-400 MG-UNIT per tablet Take 1 tablet by mouth daily.    Yes [provider]  Cholecalciferol (VITAMIN D3)  1000 UNITS CAPS Take 1 capsule by mouth daily.     Yes [provider]  cycloSPORINE (RESTASIS) 0.05 % ophthalmic emulsion PLACE 1 DROP INTO BOTH EYES TWO (TWO) TIMES DAILY. 10/11/16  Yes [provider]  diltiazem (CARDIZEM CD) 180 MG 24 hr capsule TAKE 1 CAPSULE (180 MG TOTAL) BY MOUTH DAILY. 09/23/16  Yes Bensimhon, Shaune Pascal, MD  ELIQUIS 5 MG TABS tablet TAKE 1 TABLET (5 MG TOTAL) BY MOUTH TWO TIMES DAILY. 09/27/16  Yes Nicholas Lose, MD  flecainide (TAMBOCOR) 100 MG tablet Take 50 mg by mouth 2 (two) times daily.   Yes [provider]  loratadine (CLARITIN) 10 MG tablet Take 10 mg by mouth daily.   Yes [provider]  Omega-3 Fatty Acids (FISH OIL PO) Take 1 tablet by mouth daily.    Yes [provider]  pravastatin (PRAVACHOL) 40 MG tablet TAKE 1 TABLET (40 MG TOTAL) BY MOUTH DAILY. REPEAT LABS ARE DUE NOW 09/26/16  Yes Roma Schanz R, DO  Psyllium (METAMUCIL PO) Take 15 mLs by mouth daily.    Yes [provider]  Turmeric 500 MG CAPS Take 1 capsule by mouth daily.    Yes [provider]  alendronate (FOSAMAX) 70 MG tablet TAKE 1 TABLET (70 MG TOTAL) BY MOUTH EVERY SEVEN DAYS. TAKE WITH A FULL GLASS OF WATER AT NOON TIME ON EMPTY STOMACH. 10/31/16   Carollee Herter, Alferd Apa, DO  docusate sodium (COLACE) 100 MG capsule Take 100 mg by mouth 2 (two) times daily as needed for mild constipation.    [provider]  doxycycline (VIBRAMYCIN) 100 MG capsule Take 1 capsule (100 mg total) by mouth 2 (two) times daily. 11/08/16   Daleen Bo, MD  ketoconazole (NIZORAL) 2 % cream Apply 1 application topically daily as needed for irritation.  10/07/16   [provider]  nystatin (MYCOSTATIN/NYSTOP) powder Apply topically 3 (three) times daily. Patient not taking: Reported on 11/08/2016 03/16/16   Hayden Pedro, PA-C  pravastatin (PRAVACHOL) 40 MG tablet Take 1 tablet (40 mg total) by mouth daily. Repeat labs are due now Patient not taking: Reported on 11/08/2016 08/29/16   Carollee Herter, Alferd Apa, DO  predniSONE (DELTASONE) 20 MG tablet Take 1 tablet (20 mg total) by mouth 2 (two) times daily. 11/08/16   Daleen Bo, MD  Propylene Glycol 0.6 % SOLN Apply 1 drop to eye daily as needed (dry eyes).     [provider]    Family History Family History  Problem Relation Age of Onset  . Cancer Mother        mets to bone--? primary  . Coronary artery disease Brother        died of M! @ 60  . Diabetes Brother   . Hyperlipidemia Brother   . Hypertension Brother   . Heart disease Brother        chf  . Heart disease Father 21       MI  . Lung  cancer Sister 36       former smoker  . Diabetes Maternal Aunt   . Lung cancer Unknown   . Thrombosis Unknown        thromboembolism clotting disorder--? father died of ? clot     Social History Social History  Substance Use Topics  . Smoking status: Never Smoker  . Smokeless tobacco: Never Used  . Alcohol use No     Allergies   Sulfa antibiotics; Tape; Glucosamine forte [  nutritional supplements]; and Latex   Review of Systems Review of Systems  All other systems reviewed and are negative.    Physical Exam Updated Vital Signs BP (!) 164/77 (BP Location: Left Arm)   Pulse (!) 55   Temp 98.6 F (37 C) (Oral)   Resp 16   SpO2 100%   Physical Exam  Constitutional: She is oriented to person, place, and time. She appears well-developed.  Elderly, frail  HENT:  Head: Normocephalic and atraumatic.  Eyes: Conjunctivae and EOM are normal. Pupils are equal, round, and reactive to light.  Neck: Normal range of motion and phonation normal. Neck supple.  Cardiovascular: Normal rate and regular rhythm.   Pulmonary/Chest: Effort normal and breath sounds normal. She exhibits no tenderness.  Abdominal: There is no guarding.  Musculoskeletal: Normal range of motion.  Neurological: She is alert and oriented to person, place, and time. She exhibits normal muscle tone.  Skin: Skin is warm and dry.  Psychiatric: She has a normal mood and affect. Her behavior is normal. Judgment and thought content normal.  Nursing note and vitals reviewed.    ED Treatments / Results  Labs (all labs ordered are listed, but only abnormal results are displayed) Labs Reviewed - No data to display  EKG  EKG Interpretation None       Radiology Dg Chest 2 View  Result Date: 11/07/2016 CLINICAL DATA:  Two days of weakness and cough. History of left breast malignancy, hyperlipidemia, hypertension. EXAM: CHEST  2 VIEW COMPARISON:  None in PACs FINDINGS: The lungs are reasonably well inflated.  There is no focal infiltrate. The heart is normal in size. The pulmonary vascularity is not engorged. There is calcification in the wall of the aortic arch. The mediastinum is normal in width. The observed bony thorax exhibits no acute abnormality. IMPRESSION: No pneumonia, pulmonary edema, nor other acute cardiopulmonary abnormality. Thoracic aortic atherosclerosis. Electronically Signed   By: David  Martinique M.D.   On: 11/07/2016 09:11    Procedures Procedures (including critical care time)  Medications Ordered in ED Medications  albuterol (PROVENTIL HFA;VENTOLIN HFA) 108 (90 Base) MCG/ACT inhaler 2 puff (2 puffs Inhalation Given 11/08/16 1731)  ipratropium-albuterol (DUONEB) 0.5-2.5 (3) MG/3ML nebulizer solution 3 mL (3 mLs Nebulization Given 11/08/16 1558)  predniSONE (DELTASONE) tablet 60 mg (60 mg Oral Given 11/08/16 1557)  doxycycline (VIBRA-TABS) tablet 100 mg (100 mg Oral Given 11/08/16 1558)  aerochamber Z-Stat Plus/medium 1 each (1 each Other Given 11/08/16 1732)     Initial Impression / Assessment and Plan / ED Course  I have reviewed the triage vital signs and the nursing notes.  Pertinent labs & imaging results that were available during my care of the patient were reviewed by me and considered in my medical decision making (see chart for details).     Medications  albuterol (PROVENTIL HFA;VENTOLIN HFA) 108 (90 Base) MCG/ACT inhaler 2 puff (2 puffs Inhalation Given 11/08/16 1731)  ipratropium-albuterol (DUONEB) 0.5-2.5 (3) MG/3ML nebulizer solution 3 mL (3 mLs Nebulization Given 11/08/16 1558)  predniSONE (DELTASONE) tablet 60 mg (60 mg Oral Given 11/08/16 1557)  doxycycline (VIBRA-TABS) tablet 100 mg (100 mg Oral Given 11/08/16 1558)  aerochamber Z-Stat Plus/medium 1 each (1 each Other Given 11/08/16 1732)    Patient Vitals for the past 24 hrs:  BP Temp Temp src Pulse Resp SpO2  11/08/16 1603 (!) 164/77 - - (!) 55 16 100 %  11/08/16 1341 (!) 145/76 98.6 F (37 C) Oral (!) 58 16  97 %  At d/c- Reevaluation with update and discussion. After initial assessment and treatment, an updated evaluation reveals she states she is feeling somewhat better.  Lungs have somewhat improved air movement.  Findings discussed with the patient and all questions were answered. Dashiell Franchino L    Final Clinical Impressions(s) / ED Diagnoses   Final diagnoses:  Bronchitis   Cough with normal chest x-ray.  Doubt ACS, PE or pneumonia.  Suspect nonspecific bronchitis.  Nursing Notes Reviewed/ Care Coordinated Applicable Imaging Reviewed Interpretation of Laboratory Data incorporated into ED treatment  The patient appears reasonably screened and/or stabilized for discharge and I doubt any other medical condition or other [facility] requiring further screening, evaluation, or treatment in the ED at this time prior to discharge.  Plan: Home Medications-continue usual medications.  Albuterol inhaler with spacer to go to take 2 puffs every 3 or 4 hours as needed cough or trouble breathing; Home Treatments-rest, fluids; return here if the recommended treatment, does not improve the symptoms; Recommended follow up-PCP checkup in 1 week.  New Prescriptions Discharge Medication List as of 11/08/2016  5:25 PM    START taking these medications   Details  doxycycline (VIBRAMYCIN) 100 MG capsule Take 1 capsule (100 mg total) by mouth 2 (two) times daily., Starting Tue 11/08/2016, Print    predniSONE (DELTASONE) 20 MG tablet Take 1 tablet (20 mg total) by mouth 2 (two) times daily., Starting Tue 11/08/2016, Print         Daleen Bo, MD 11/08/16 2037

## 2016-11-08 NOTE — ED Notes (Signed)
Bed: UY29 Expected date:  Expected time:  Means of arrival:  Comments: 83 yo f, cough

## 2016-11-08 NOTE — Discharge Instructions (Signed)
Use the albuterol inhaler 2 puffs every 3 or 4 hours for cough or trouble breathing.  Start the antibiotic and prednisone prescriptions, tomorrow morning.  Use Tylenol if needed for pain or fever.  You can also use Robitussin-DM if needed for cough.

## 2016-11-08 NOTE — ED Triage Notes (Signed)
Per EMS, pt from home.  Pt c/o coughing since Sunday.  [facility]-fib.  No SOB.  No fever.  Non productive.  A/O x 4.  Vitals:  122/84, hr 60, resp 18, 96% ra

## 2016-11-09 ENCOUNTER — Encounter (HOSPITAL_COMMUNITY): Payer: Self-pay | Admitting: Nurse Practitioner

## 2016-11-09 ENCOUNTER — Ambulatory Visit (HOSPITAL_COMMUNITY)
Admission: RE | Admit: 2016-11-09 | Discharge: 2016-11-09 | Disposition: A | Discharge status: Home / Self Care | Payer: Medicare PPO | Source: Ambulatory Visit | Attending: Nurse Practitioner | Admitting: Nurse Practitioner

## 2016-11-09 VITALS — BP 124/72 | HR 66 | Ht 61.0 in | Wt 156.8 lb

## 2016-11-09 DIAGNOSIS — K219 Gastro-esophageal reflux disease without esophagitis: Secondary | ICD-10-CM | POA: Diagnosis not present

## 2016-11-09 DIAGNOSIS — I10 Essential (primary) hypertension: Secondary | ICD-10-CM | POA: Diagnosis not present

## 2016-11-09 DIAGNOSIS — E785 Hyperlipidemia, unspecified: Secondary | ICD-10-CM | POA: Diagnosis not present

## 2016-11-09 DIAGNOSIS — Z8249 Family history of ischemic heart disease and other diseases of the circulatory system: Secondary | ICD-10-CM | POA: Diagnosis not present

## 2016-11-09 DIAGNOSIS — Z79899 Other long term (current) drug therapy: Secondary | ICD-10-CM | POA: Diagnosis not present

## 2016-11-09 DIAGNOSIS — Z853 Personal history of malignant neoplasm of breast: Secondary | ICD-10-CM | POA: Diagnosis not present

## 2016-11-09 DIAGNOSIS — Z85828 Personal history of other malignant neoplasm of skin: Secondary | ICD-10-CM | POA: Insufficient documentation

## 2016-11-09 DIAGNOSIS — Z7983 Long term (current) use of bisphosphonates: Secondary | ICD-10-CM | POA: Diagnosis not present

## 2016-11-09 DIAGNOSIS — Z882 Allergy status to sulfonamides status: Secondary | ICD-10-CM | POA: Insufficient documentation

## 2016-11-09 DIAGNOSIS — Z7901 Long term (current) use of anticoagulants: Secondary | ICD-10-CM | POA: Insufficient documentation

## 2016-11-09 DIAGNOSIS — Z833 Family history of diabetes mellitus: Secondary | ICD-10-CM | POA: Diagnosis not present

## 2016-11-09 DIAGNOSIS — I480 Paroxysmal atrial fibrillation: Secondary | ICD-10-CM | POA: Insufficient documentation

## 2016-11-09 DIAGNOSIS — Z85820 Personal history of malignant melanoma of skin: Secondary | ICD-10-CM | POA: Insufficient documentation

## 2016-11-09 NOTE — Progress Notes (Signed)
Primary Care Physician: Carollee Herter, Alferd Apa, DO Referring Physician: Dr. Orene Desanctis   Jessica Mullins is a 83 y.o. female with a h/o HTN, HL, PAF and Left breast cancer. She was diagnosed with afib in 06/2015 and was placed on flecainide. She recently presented to the ER with weakness and was found to be in afib but converted before she left. The next day she went back with cough and was diagnosed with bronchitis and was placed on doxycycline and prednisone.  She was given this appointment from the first ER visit and does not feel like she has had any more afib but she is still coughing from the bronchitis. It would not be unusual to have some afib with the stress of bronchitis and with RX of prednisone. I don't think it can be said flecainide is failing controlling afib in the setting of URI. She continues on Eliquis appropriately dosed at 5 mg bid for a chads vasc score of 4.  Today, she denies symptoms of palpitations, chest pain, shortness of breath, orthopnea, PND, lower extremity edema, dizziness, presyncope, syncope, or neurologic sequela. The patient is tolerating medications without difficulties and is otherwise without complaint today.   Past Medical History:  Diagnosis Date  . Allergy   . Ankle fracture, left   . Breast cancer ([place]) 1115/16   left   . Bursitis of right shoulder   . Cataract   . Family history of cancer   . Fracture of right wrist   . GERD (gastroesophageal reflux disease)   . Hyperlipidemia   . Hypertension   . Skin cancer 2000   melanoma and basal cell  . Vaso vagal episode    during preparation for colonoscopy   Past Surgical History:  Procedure Laterality Date  . ABDOMINAL HYSTERECTOMY  1988   TAH/BSO--FIBROIDS  . APPENDECTOMY    . BREAST LUMPECTOMY     B/L--[facility]  . BREAST LUMPECTOMY WITH RADIOACTIVE SEED AND SENTINEL LYMPH NODE BIOPSY Left 06/11/2015   Procedure: LEFT BREAST LUMPECTOMY WITH RADIOACTIVE SEED AND LEFT SENTINEL LYMPH NODE MAPPING;   Surgeon: Erroll Luna, MD;  Location: [facility];  Service: General;  Laterality: Left;  . CATARACT EXTRACTION  2015  . COLONOSCOPY  2010  . DG  BONE DENSITY ([place] HX)    . EYE SURGERY Bilateral    cataracts  . fiberadenoma Bilateral 1978, 1980  . GANGLION CYST EXCISION     L  hand  . Lipiflow procedure      Current Outpatient Prescriptions  Medication Sig Dispense Refill  . alendronate (FOSAMAX) 70 MG tablet TAKE 1 TABLET (70 MG TOTAL) BY MOUTH EVERY SEVEN DAYS. TAKE WITH A FULL GLASS OF WATER AT NOON TIME ON EMPTY STOMACH. 4 tablet 6  . Biotin (BIOTIN 5000) 5 MG CAPS Take 5 mg by mouth daily.     . Calcium Carbonate-Vitamin D (CALTRATE 600+D) 600-400 MG-UNIT per tablet Take 1 tablet by mouth daily.     . Cholecalciferol (VITAMIN D3) 1000 UNITS CAPS Take 1 capsule by mouth daily.      . cycloSPORINE (RESTASIS) 0.05 % ophthalmic emulsion PLACE 1 DROP INTO BOTH EYES TWO (TWO) TIMES DAILY.    [name] diltiazem (CARDIZEM CD) 180 MG 24 hr capsule TAKE 1 CAPSULE (180 MG TOTAL) BY MOUTH DAILY. 30 capsule 11  . doxycycline (VIBRAMYCIN) 100 MG capsule Take 1 capsule (100 mg total) by mouth 2 (two) times daily. 20 capsule 0  . ELIQUIS 5 MG TABS tablet TAKE 1 TABLET (  5 MG TOTAL) BY MOUTH TWO TIMES DAILY. 60 tablet 0  . flecainide (TAMBOCOR) 100 MG tablet Take 50 mg by mouth 2 (two) times daily.    [name] ketoconazole (NIZORAL) 2 % cream Apply 1 application topically daily as needed for irritation.     [name] loratadine (CLARITIN) 10 MG tablet Take 10 mg by mouth daily.    [name] nystatin (MYCOSTATIN/NYSTOP) powder Apply topically 3 (three) times daily. 30 g 1  . Omega-3 Fatty Acids (FISH OIL PO) Take 1 tablet by mouth daily.     . pravastatin (PRAVACHOL) 40 MG tablet Take 1 tablet (40 mg total) by mouth daily. Repeat labs are due now 90 tablet 1  . predniSONE (DELTASONE) 20 MG tablet Take 1 tablet (20 mg total) by mouth 2 (two) times daily. 10 tablet 0  . Propylene Glycol 0.6 % SOLN Apply 1 drop to eye daily as needed  (dry eyes).     . Psyllium (METAMUCIL PO) Take 15 mLs by mouth daily.     . Turmeric 500 MG CAPS Take 1 capsule by mouth daily.     [name] docusate sodium (COLACE) 100 MG capsule Take 100 mg by mouth 2 (two) times daily as needed for mild constipation.     No current facility-administered medications for this encounter.    Facility-Administered Medications Ordered in Other Encounters  Medication Dose Route Frequency Provider Last Rate Last Dose  . sodium chloride 0.9 % injection 10 mL  10 mL Intracatheter PRN Nicholas Lose, MD   10 mL at 09/21/15 1717    Allergies  Allergen Reactions  . Sulfa Antibiotics Other (See Comments)    As a child, rigid as a stick and not responsive  . Tape Other (See Comments)    Blisters, Please use "paper" tape only for short periods of time  . Glucosamine Forte [Nutritional Supplements] Rash  . Latex Rash    Social History   Social History  . Marital status: Widowed    Spouse name: N/A  . Number of children: N/A  . Years of education: N/A   Occupational History  . teacher Retired    retired   Social History Main Topics  . Smoking status: Never Smoker  . Smokeless tobacco: Never Used  . Alcohol use No  . Drug use: No  . Sexual activity: No   Other Topics Concern  . Not on file   Social History Narrative  . No narrative on file    Family History  Problem Relation Age of Onset  . Cancer Mother        mets to bone--? primary  . Coronary artery disease Brother        died of M! @ 22  . Diabetes Brother   . Hyperlipidemia Brother   . Hypertension Brother   . Heart disease Brother        chf  . Heart disease Father 23       MI  . Lung cancer Sister 20       former smoker  . Diabetes Maternal Aunt   . Lung cancer Unknown   . Thrombosis Unknown        thromboembolism clotting disorder--? father died of ? clot     ROS- All systems are reviewed and negative except as per the HPI above  Physical Exam: Vitals:   11/09/16 0923  BP:  124/72  Pulse: 66  Weight: 156 lb 12.8 oz (71.1 kg)  Height: 5\' 1"  (1.549 m)  Wt Readings from Last 3 Encounters:  11/09/16 156 lb 12.8 oz (71.1 kg)  09/13/16 161 lb (73 kg)  08/29/16 159 lb (72.1 kg)    Labs: Lab Results  Component Value Date   NA 136 11/07/2016   K 3.3 (L) 11/07/2016   CL 99 (L) 11/07/2016   CO2 27 11/07/2016   GLUCOSE 125 (H) 11/07/2016   BUN 13 11/07/2016   CREATININE 1.01 (H) 11/07/2016   CALCIUM 9.0 11/07/2016   MG 2.2 08/04/2015   Lab Results  Component Value Date   INR 1.20 10/21/2015   Lab Results  Component Value Date   CHOL 149 08/29/2016   HDL 63.20 08/29/2016   LDLCALC 69 08/29/2016   TRIG 84.0 08/29/2016     GEN- The patient is well appearing, alert and oriented x 3 today.   Head- normocephalic, atraumatic Eyes-  Sclera clear, conjunctiva pink Ears- hearing intact Oropharynx- clear Neck- supple, no JVP Lymph- no cervical lymphadenopathy Lungs- Clear to ausculation bilaterally, normal work of breathing Heart- Regular rate and rhythm, no murmurs, rubs or gallops, PMI not laterally displaced GI- soft, NT, ND, + BS Extremities- no clubbing, cyanosis, or edema MS- no significant deformity or atrophy Skin- no rash or lesion Psych- euthymic mood, full affect Neuro- strength and sensation are intact  EKG-NSR at 66 bpm, pr int 156 ms, qrs int 88 ms, qtc 444 ms  Epic records reviewed    Assessment and Plan: 1. PAF Recent episode in the setting of bronchitis In SR since then Continue flecainide 50 mg bid Continue eliquis 5 mg bid appropriately dosed Finish prednisone and antibiotics  F/u in 10 days but if no further afib, she can call office and cancel visit  Butch Penny C. Jalisia Puchalski, [facility] [address] [place], [place] [address] [phone]

## 2016-11-14 ENCOUNTER — Encounter: Payer: Self-pay | Admitting: Family Medicine

## 2016-11-14 ENCOUNTER — Ambulatory Visit (INDEPENDENT_AMBULATORY_CARE_PROVIDER_SITE_OTHER): Payer: Medicare PPO | Admitting: Family Medicine

## 2016-11-14 VITALS — BP 138/60 | HR 54 | Temp 98.0°F | Ht 61.0 in | Wt 153.6 lb

## 2016-11-14 DIAGNOSIS — J40 Bronchitis, not specified as acute or chronic: Secondary | ICD-10-CM

## 2016-11-14 MED ORDER — METHYLPREDNISOLONE ACETATE 80 MG/ML IJ SUSP
80.0000 mg | Freq: Once | INTRAMUSCULAR | Status: AC
  Administered 2016-11-14: 80 mg via INTRAMUSCULAR

## 2016-11-14 MED ORDER — ALBUTEROL SULFATE HFA 108 (90 BASE) MCG/ACT IN AERS: 2.0000 | INHALATION_SPRAY | Freq: Four times a day (QID) | RESPIRATORY_TRACT | 2 refills | Status: DC | PRN

## 2016-11-14 MED ORDER — ALBUTEROL SULFATE (2.5 MG/3ML) 0.083% IN NEBU
2.5000 mg | INHALATION_SOLUTION | Freq: Once | RESPIRATORY_TRACT | Status: AC
  Administered 2016-11-14: 2.5 mg via RESPIRATORY_TRACT

## 2016-11-14 MED ORDER — AZITHROMYCIN 250 MG PO TABS: ORAL_TABLET | ORAL | 0 refills | Status: DC

## 2016-11-14 MED ORDER — PREDNISONE 10 MG PO TABS: ORAL_TABLET | ORAL | 0 refills | Status: DC

## 2016-11-14 NOTE — Progress Notes (Signed)
Patient ID: Jessica Mullins, female    DOB: 12/23/1930  Age: 83 y.o. MRN: [id number]    Subjective:  Subjective  HPI Jessica Mullins presents for f/u er for bronchitis.  Pt is seeing cardiology for the a fib.  Pt still c/o cough--- she is on doxy and finished prednisone.  No fever.  Pt does feel tight in chest.   Review of Systems  Constitutional: Negative for chills and fever.  HENT: Positive for congestion. Negative for postnasal drip, rhinorrhea and sinus pressure.   Respiratory: Positive for cough, chest tightness, shortness of breath and wheezing.   Cardiovascular: Negative for chest pain, palpitations and leg swelling.  Allergic/Immunologic: Negative for environmental allergies.    History Past Medical History:  Diagnosis Date  . Allergy   . Ankle fracture, left   . Breast cancer ([place]) 1115/16   left   . Bursitis of right shoulder   . Cataract   . Family history of cancer   . Fracture of right wrist   . GERD (gastroesophageal reflux disease)   . Hyperlipidemia   . Hypertension   . Skin cancer 2000   melanoma and basal cell  . Vaso vagal episode    during preparation for colonoscopy    She has a past surgical history that includes Appendectomy; Ganglion cyst excision; Breast lumpectomy; Lipiflow procedure; Cataract extraction (2015); Abdominal hysterectomy (1988); fiberadenoma (Bilateral, 1978, 1980); Colonoscopy (2010); DG  BONE DENSITY ([place] HX); Eye surgery (Bilateral); and Breast lumpectomy with radioactive seed and sentinel lymph node biopsy (Left, 06/11/2015).   Her family history includes Cancer in her mother; Coronary artery disease in her brother; Diabetes in her brother and maternal aunt; Heart disease in her brother; Heart disease (age of onset: 26) in her father; Hyperlipidemia in her brother; Hypertension in her brother; Lung cancer (age of onset: 15) in her sister.She reports that she has never smoked. She has never used smokeless tobacco. She reports that  she does not drink alcohol or use drugs.  Current Outpatient Prescriptions on File Prior to Visit  Medication Sig Dispense Refill  . alendronate (FOSAMAX) 70 MG tablet TAKE 1 TABLET (70 MG TOTAL) BY MOUTH EVERY SEVEN DAYS. TAKE WITH A FULL GLASS OF WATER AT NOON TIME ON EMPTY STOMACH. 4 tablet 6  . Biotin (BIOTIN 5000) 5 MG CAPS Take 5 mg by mouth daily.     . Calcium Carbonate-Vitamin D (CALTRATE 600+D) 600-400 MG-UNIT per tablet Take 1 tablet by mouth daily.     . Cholecalciferol (VITAMIN D3) 1000 UNITS CAPS Take 1 capsule by mouth daily.      . cycloSPORINE (RESTASIS) 0.05 % ophthalmic emulsion PLACE 1 DROP INTO BOTH EYES TWO (TWO) TIMES DAILY.    [name] diltiazem (CARDIZEM CD) 180 MG 24 hr capsule TAKE 1 CAPSULE (180 MG TOTAL) BY MOUTH DAILY. 30 capsule 11  . doxycycline (VIBRAMYCIN) 100 MG capsule Take 1 capsule (100 mg total) by mouth 2 (two) times daily. 20 capsule 0  . ELIQUIS 5 MG TABS tablet TAKE 1 TABLET (5 MG TOTAL) BY MOUTH TWO TIMES DAILY. 60 tablet 0  . flecainide (TAMBOCOR) 100 MG tablet Take 50 mg by mouth 2 (two) times daily.    [name] ketoconazole (NIZORAL) 2 % cream Apply 1 application topically daily as needed for irritation.     [name] loratadine (CLARITIN) 10 MG tablet Take 10 mg by mouth daily.    [name] nystatin (MYCOSTATIN/NYSTOP) powder Apply topically 3 (three) times daily. 30 g 1  .  Omega-3 Fatty Acids (FISH OIL PO) Take 1 tablet by mouth daily.     . pravastatin (PRAVACHOL) 40 MG tablet Take 1 tablet (40 mg total) by mouth daily. Repeat labs are due now 90 tablet 1  . Propylene Glycol 0.6 % SOLN Apply 1 drop to eye daily as needed (dry eyes).     . Psyllium (METAMUCIL PO) Take 15 mLs by mouth daily.     . Turmeric 500 MG CAPS Take 1 capsule by mouth daily.     [name] docusate sodium (COLACE) 100 MG capsule Take 100 mg by mouth 2 (two) times daily as needed for mild constipation.     Current Facility-Administered Medications on File Prior to Visit  Medication Dose Route Frequency Provider  Last Rate Last Dose  . sodium chloride 0.9 % injection 10 mL  10 mL Intracatheter PRN Nicholas Lose, MD   10 mL at 09/21/15 1717     Objective:  Objective  Physical Exam  Constitutional: She is oriented to person, place, and time. She appears well-developed and well-nourished.  HENT:  Right Ear: External ear normal.  Left Ear: External ear normal.  Eyes: Conjunctivae are normal. Right eye exhibits no discharge. Left eye exhibits no discharge.  Cardiovascular: Normal rate, regular rhythm and normal heart sounds.   No murmur heard. Pulmonary/Chest: Effort normal. No respiratory distress. She has decreased breath sounds. She has wheezes. She has no rales. She exhibits no tenderness.  Musculoskeletal: She exhibits no edema.  Lymphadenopathy:    She has cervical adenopathy.  Neurological: She is alert and oriented to person, place, and time.  Nursing note and vitals reviewed.  BP 138/60 (BP Location: Right Arm, Patient Position: Sitting, Cuff Size: Normal)   Pulse (!) 54   Temp 98 F (36.7 C) (Oral)   Ht 5\' 1"  (1.549 m)   Wt 153 lb 9.6 oz (69.7 kg)   SpO2 98%   BMI 29.02 kg/m  Wt Readings from Last 3 Encounters:  11/14/16 153 lb 9.6 oz (69.7 kg)  11/09/16 156 lb 12.8 oz (71.1 kg)  09/13/16 161 lb (73 kg)     Lab Results  Component Value Date   WBC 6.3 11/07/2016   HGB 15.1 (H) 11/07/2016   HCT 42.0 11/07/2016   PLT 163 11/07/2016   GLUCOSE 125 (H) 11/07/2016   CHOL 149 08/29/2016   TRIG 84.0 08/29/2016   HDL 63.20 08/29/2016   LDLDIRECT 153.4 11/16/2009   LDLCALC 69 08/29/2016   ALT 21 11/07/2016   AST 27 11/07/2016   NA 136 11/07/2016   K 3.3 (L) 11/07/2016   CL 99 (L) 11/07/2016   CREATININE 1.01 (H) 11/07/2016   BUN 13 11/07/2016   CO2 27 11/07/2016   TSH 3.490 11/07/2016   INR 1.20 10/21/2015    No results found.   Assessment & Plan:  Plan  I have discontinued Jessica Mullins's predniSONE. I am also having her start on azithromycin, predniSONE, and  albuterol. Additionally, I am having her maintain her Calcium Carbonate-Vitamin D, Vitamin D3, Omega-3 Fatty Acids (FISH OIL PO), Biotin, Turmeric, Propylene Glycol, Psyllium (METAMUCIL PO), loratadine, nystatin, flecainide, docusate sodium, pravastatin, diltiazem, ELIQUIS, alendronate, cycloSPORINE, ketoconazole, and doxycycline. We administered albuterol and methylPREDNISolone acetate.  Meds ordered this encounter  Medications  . azithromycin (ZITHROMAX Z-PAK) 250 MG tablet    Sig: As directed    Dispense:  6 each    Refill:  0  . predniSONE (DELTASONE) 10 MG tablet    Sig: TAKE 3 TABLETS  PO QD FOR 3 DAYS THEN TAKE 2 TABLETS PO QD FOR 3 DAYS THEN TAKE 1 TABLET PO QD FOR 3 DAYS THEN TAKE 1/2 TAB PO QD FOR 3 DAYS    Dispense:  20 tablet    Refill:  0  . albuterol (PROVENTIL HFA;VENTOLIN HFA) 108 (90 Base) MCG/ACT inhaler    Sig: Inhale 2 puffs into the lungs every 6 (six) hours as needed for wheezing or shortness of breath.    Dispense:  1 Inhaler    Refill:  2  . albuterol (PROVENTIL) (2.5 MG/3ML) 0.083% nebulizer solution 2.5 mg  . methylPREDNISolone acetate (DEPO-MEDROL) injection 80 mg    Problem List Items Addressed This Visit    None    Visit Diagnoses    Bronchitis    -  Primary   Relevant Medications   azithromycin (ZITHROMAX Z-PAK) 250 MG tablet   predniSONE (DELTASONE) 10 MG tablet   albuterol (PROVENTIL HFA;VENTOLIN HFA) 108 (90 Base) MCG/ACT inhaler   albuterol (PROVENTIL) (2.5 MG/3ML) 0.083% nebulizer solution 2.5 mg (Completed)   methylPREDNISolone acetate (DEPO-MEDROL) injection 80 mg (Completed)      Follow-up: Return in about 2 weeks (around 11/28/2016), or ok to cancel if feeling better.  Ann Held, DO

## 2016-11-14 NOTE — Patient Instructions (Signed)

## 2016-11-15 NOTE — Addendum Note (Signed)
Encounter addended by: Sherran Needs, NP on: 11/15/2016 10:03 AM<BR>    Actions taken: LOS modified

## 2016-11-22 ENCOUNTER — Ambulatory Visit (HOSPITAL_COMMUNITY): Payer: Medicare PPO | Admitting: Nurse Practitioner

## 2016-11-24 ENCOUNTER — Other Ambulatory Visit: Payer: Self-pay | Admitting: Hematology and Oncology

## 2016-11-24 DIAGNOSIS — I4819 Other persistent atrial fibrillation: Secondary | ICD-10-CM

## 2016-12-26 ENCOUNTER — Other Ambulatory Visit (HOSPITAL_COMMUNITY): Payer: Self-pay | Admitting: Internal Medicine

## 2017-01-16 DIAGNOSIS — Z923 Personal history of irradiation: Secondary | ICD-10-CM | POA: Diagnosis not present

## 2017-01-16 DIAGNOSIS — Z90710 Acquired absence of both cervix and uterus: Secondary | ICD-10-CM | POA: Diagnosis not present

## 2017-01-16 DIAGNOSIS — Z853 Personal history of malignant neoplasm of breast: Secondary | ICD-10-CM | POA: Diagnosis not present

## 2017-01-16 DIAGNOSIS — Z9221 Personal history of antineoplastic chemotherapy: Secondary | ICD-10-CM | POA: Diagnosis not present

## 2017-01-16 DIAGNOSIS — I4891 Unspecified atrial fibrillation: Secondary | ICD-10-CM | POA: Diagnosis not present

## 2017-01-16 DIAGNOSIS — E785 Hyperlipidemia, unspecified: Secondary | ICD-10-CM | POA: Diagnosis not present

## 2017-01-16 DIAGNOSIS — E663 Overweight: Secondary | ICD-10-CM | POA: Diagnosis not present

## 2017-01-16 DIAGNOSIS — Z6829 Body mass index (BMI) 29.0-29.9, adult: Secondary | ICD-10-CM | POA: Diagnosis not present

## 2017-01-26 DIAGNOSIS — H04129 Dry eye syndrome of unspecified lacrimal gland: Secondary | ICD-10-CM | POA: Diagnosis not present

## 2017-01-31 ENCOUNTER — Other Ambulatory Visit: Payer: Self-pay | Admitting: Oncology

## 2017-01-31 DIAGNOSIS — I4819 Other persistent atrial fibrillation: Secondary | ICD-10-CM

## 2017-02-14 ENCOUNTER — Ambulatory Visit (INDEPENDENT_AMBULATORY_CARE_PROVIDER_SITE_OTHER): Payer: Medicare PPO | Admitting: Family Medicine

## 2017-02-14 ENCOUNTER — Encounter: Payer: Self-pay | Admitting: Family Medicine

## 2017-02-14 VITALS — BP 122/76 | HR 60 | Wt 153.0 lb

## 2017-02-14 DIAGNOSIS — K649 Unspecified hemorrhoids: Secondary | ICD-10-CM

## 2017-02-14 MED ORDER — HYDROCORTISONE ACETATE 25 MG RE SUPP: 25.0000 mg | Freq: Two times a day (BID) | RECTAL | 0 refills | Status: DC

## 2017-02-14 NOTE — Progress Notes (Signed)
Patient ID: Jessica Mullins, female    DOB: 1930/09/14  Age: 83 y.o. MRN: [id number]    Subjective:  Subjective  HPI Jessica Mullins presents for constipation and ext hemorrhoids  It all started when she started eliquis  Review of Systems  Constitutional: Negative for activity change, appetite change, fatigue and unexpected weight change.  Respiratory: Negative for cough and shortness of breath.   Cardiovascular: Negative for chest pain and palpitations.  Gastrointestinal: Positive for constipation. Negative for blood in stool and diarrhea.  Psychiatric/Behavioral: Negative for behavioral problems and dysphoric mood. The patient is not nervous/anxious.     History Past Medical History:  Diagnosis Date  . Allergy   . Ankle fracture, left   . Breast cancer ([place]) 1115/16   left   . Bursitis of right shoulder   . Cataract   . Family history of cancer   . Fracture of right wrist   . GERD (gastroesophageal reflux disease)   . Hyperlipidemia   . Hypertension   . Skin cancer 2000   melanoma and basal cell  . Vaso vagal episode    during preparation for colonoscopy    She has a past surgical history that includes Appendectomy; Ganglion cyst excision; Breast lumpectomy; Lipiflow procedure; Cataract extraction (2015); Abdominal hysterectomy (1988); fiberadenoma (Bilateral, 1978, 1980); Colonoscopy (2010); DG  BONE DENSITY ([place] HX); Eye surgery (Bilateral); and Breast lumpectomy with radioactive seed and sentinel lymph node biopsy (Left, 06/11/2015).   Her family history includes Cancer in her mother; Coronary artery disease in her brother; Diabetes in her brother and maternal aunt; Heart disease in her brother; Heart disease (age of onset: 62) in her father; Hyperlipidemia in her brother; Hypertension in her brother; Lung cancer in her unknown relative; Lung cancer (age of onset: 52) in her sister; Thrombosis in her unknown relative.She reports that she has never smoked. She has never  used smokeless tobacco. She reports that she does not drink alcohol or use drugs.  Current Outpatient Prescriptions on File Prior to Visit  Medication Sig Dispense Refill  . alendronate (FOSAMAX) 70 MG tablet TAKE 1 TABLET (70 MG TOTAL) BY MOUTH EVERY SEVEN DAYS. TAKE WITH A FULL GLASS OF WATER AT NOON TIME ON EMPTY STOMACH. 4 tablet 6  . Biotin (BIOTIN 5000) 5 MG CAPS Take 5 mg by mouth daily.     . Calcium Carbonate-Vitamin D (CALTRATE 600+D) 600-400 MG-UNIT per tablet Take 1 tablet by mouth daily.     . Cholecalciferol (VITAMIN D3) 1000 UNITS CAPS Take 1 capsule by mouth daily.      . cycloSPORINE (RESTASIS) 0.05 % ophthalmic emulsion PLACE 1 DROP INTO BOTH EYES TWO (TWO) TIMES DAILY.    [name] diltiazem (CARDIZEM CD) 180 MG 24 hr capsule TAKE 1 CAPSULE (180 MG TOTAL) BY MOUTH DAILY. 30 capsule 11  . ELIQUIS 5 MG TABS tablet TAKE 1 TABLET (5 MG TOTAL) BY MOUTH TWO TIMES DAILY. 60 tablet 1  . flecainide (TAMBOCOR) 50 MG tablet Take 1 tablet (50 mg total) by mouth 2 (two) times daily. 60 tablet 3  . ketoconazole (NIZORAL) 2 % cream Apply 1 application topically daily as needed for irritation.     [name] loratadine (CLARITIN) 10 MG tablet Take 10 mg by mouth daily.    . Omega-3 Fatty Acids (FISH OIL PO) Take 1 tablet by mouth daily.     . pravastatin (PRAVACHOL) 40 MG tablet Take 1 tablet (40 mg total) by mouth daily. Repeat labs are due now 90  tablet 1  . Propylene Glycol 0.6 % SOLN Apply 1 drop to eye daily as needed (dry eyes).     . Psyllium (METAMUCIL PO) Take 15 mLs by mouth daily.     . Turmeric 500 MG CAPS Take 1 capsule by mouth daily.      Current Facility-Administered Medications on File Prior to Visit  Medication Dose Route Frequency Provider Last Rate Last Dose  . sodium chloride 0.9 % injection 10 mL  10 mL Intracatheter PRN Nicholas Lose, MD   10 mL at 09/21/15 1717     Objective:  Objective  Physical Exam  Constitutional: She is oriented to person, place, and time. She appears  well-developed and well-nourished.  HENT:  Head: Normocephalic and atraumatic.  Eyes: Conjunctivae and EOM are normal.  Neck: Normal range of motion. Neck supple. No JVD present. Carotid bruit is not present. No thyromegaly present.  Cardiovascular: Normal rate, regular rhythm and normal heart sounds.   No murmur heard. Pulmonary/Chest: Effort normal and breath sounds normal. No respiratory distress. She has no wheezes. She has no rales. She exhibits no tenderness.  Abdominal: She exhibits no distension and no mass. There is no tenderness. There is no rebound and no guarding.  Genitourinary: Rectal exam shows external hemorrhoid and internal hemorrhoid. Rectal exam shows guaiac negative stool.  Musculoskeletal: She exhibits no edema.  Neurological: She is alert and oriented to person, place, and time.  Psychiatric: She has a normal mood and affect.  Nursing note and vitals reviewed.  BP 122/76   Pulse 60   Wt 153 lb (69.4 kg)   SpO2 97%   BMI 28.91 kg/m  Wt Readings from Last 3 Encounters:  02/14/17 153 lb (69.4 kg)  11/14/16 153 lb 9.6 oz (69.7 kg)  11/09/16 156 lb 12.8 oz (71.1 kg)     Lab Results  Component Value Date   WBC 6.3 11/07/2016   HGB 15.1 (H) 11/07/2016   HCT 42.0 11/07/2016   PLT 163 11/07/2016   GLUCOSE 125 (H) 11/07/2016   CHOL 149 08/29/2016   TRIG 84.0 08/29/2016   HDL 63.20 08/29/2016   LDLDIRECT 153.4 11/16/2009   LDLCALC 69 08/29/2016   ALT 21 11/07/2016   AST 27 11/07/2016   NA 136 11/07/2016   K 3.3 (L) 11/07/2016   CL 99 (L) 11/07/2016   CREATININE 1.01 (H) 11/07/2016   BUN 13 11/07/2016   CO2 27 11/07/2016   TSH 3.490 11/07/2016   INR 1.20 10/21/2015    No results found.   Assessment & Plan:  Plan  I have discontinued Ms. Danh's nystatin, docusate sodium, doxycycline, azithromycin, predniSONE, and albuterol. I am also having her start on hydrocortisone. Additionally, I am having her maintain her Calcium Carbonate-Vitamin D,  Vitamin D3, Omega-3 Fatty Acids (FISH OIL PO), Biotin, Turmeric, Propylene Glycol, Psyllium (METAMUCIL PO), loratadine, pravastatin, diltiazem, alendronate, cycloSPORINE, ketoconazole, flecainide, and ELIQUIS.  Meds ordered this encounter  Medications  . hydrocortisone (ANUSOL-HC) 25 MG suppository    Sig: Place 1 suppository (25 mg total) rectally 2 (two) times daily.    Dispense:  12 suppository    Refill:  0    Problem List Items Addressed This Visit      Unprioritized   Hemorrhoids - Primary    Inc fiber in diet anusol supp miralax daily May need GI referral       Relevant Medications   hydrocortisone (ANUSOL-HC) 25 MG suppository      Follow-up: Return if symptoms worsen or  fail to improve.  Ann Held, DO

## 2017-02-14 NOTE — Patient Instructions (Signed)

## 2017-02-14 NOTE — Assessment & Plan Note (Signed)
Inc fiber in diet anusol supp miralax daily May need GI referral

## 2017-03-10 ENCOUNTER — Encounter (HOSPITAL_COMMUNITY): Payer: Medicare PPO | Admitting: Internal Medicine

## 2017-03-31 ENCOUNTER — Other Ambulatory Visit: Payer: Self-pay | Admitting: Hematology and Oncology

## 2017-03-31 DIAGNOSIS — I4819 Other persistent atrial fibrillation: Secondary | ICD-10-CM

## 2017-04-21 ENCOUNTER — Ambulatory Visit (HOSPITAL_COMMUNITY)
Admission: RE | Admit: 2017-04-21 | Discharge: 2017-04-21 | Disposition: A | Discharge status: Home / Self Care | Payer: Medicare PPO | Source: Ambulatory Visit | Attending: Internal Medicine | Admitting: Internal Medicine

## 2017-04-21 ENCOUNTER — Encounter (HOSPITAL_COMMUNITY): Payer: Self-pay | Admitting: Internal Medicine

## 2017-04-21 VITALS — BP 138/84 | HR 68 | Wt 156.8 lb

## 2017-04-21 DIAGNOSIS — Z79899 Other long term (current) drug therapy: Secondary | ICD-10-CM | POA: Insufficient documentation

## 2017-04-21 DIAGNOSIS — I480 Paroxysmal atrial fibrillation: Secondary | ICD-10-CM | POA: Insufficient documentation

## 2017-04-21 DIAGNOSIS — K219 Gastro-esophageal reflux disease without esophagitis: Secondary | ICD-10-CM | POA: Diagnosis not present

## 2017-04-21 DIAGNOSIS — C50919 Malignant neoplasm of unspecified site of unspecified female breast: Secondary | ICD-10-CM | POA: Diagnosis not present

## 2017-04-21 DIAGNOSIS — E785 Hyperlipidemia, unspecified: Secondary | ICD-10-CM | POA: Insufficient documentation

## 2017-04-21 DIAGNOSIS — Z85820 Personal history of malignant melanoma of skin: Secondary | ICD-10-CM | POA: Insufficient documentation

## 2017-04-21 DIAGNOSIS — I10 Essential (primary) hypertension: Secondary | ICD-10-CM | POA: Insufficient documentation

## 2017-04-21 DIAGNOSIS — I4891 Unspecified atrial fibrillation: Secondary | ICD-10-CM

## 2017-04-21 NOTE — Addendum Note (Signed)
Encounter addended by: Darron Doom, RN on: 04/21/2017 12:01 PM<BR>    Actions taken: Order list changed, Sign clinical note

## 2017-04-21 NOTE — Patient Instructions (Signed)
Follow up in 1 year, please call us to schedule your appointment.  [phone] Opt 3.

## 2017-04-21 NOTE — Progress Notes (Signed)
Patient ID: Jessica Mullins, female   DOB: 13-Feb-1931, 83 y.o.   MRN: [id number] Patient ID: Jessica Mullins, female   DOB: 1931/03/22, 83 y.o.   MRN: [id number]  CARDIOLOGY CLINIC  NOTE  Referring Physician: Lindi Adie Primary Care: Etter Sjogren Primary Cardiologist: None  HPI:  Jessica Mullins is a an 83 y.o woman with h/o HTN, HL, PAF and left breast CA.   Diagnosed with breast CA in 11/17. Underwent lumpectomy in 12/16. Had 2/4 rounds of chemo so far with taxotere and Cytoxan last dose Monday.  Found to have AF with RVR in 2017. Started on Eliquis. Echo normal.   We arranged for TEE/DC-CV on 08/14/15 but when she arrived for procedure she was in NSR. Given high risk of recurrence we started flecainide 50 bid. She had treadmill test to exclude proarrhythmia. Baseline ECG with NSR 73. QRS and PR intervlas normal. No arrhythmias. Nonspecific ST changes with exercise.   Returns for f/u: Doing well. Had episode of PAF in May in setting of bronchitis and coughing. Came to ER. Was in AF and converted spontaneously. Remains active. Exercises everyday. No palpitations or bleeding. No CP or SOB. Continues on flecainide 50 bid. Mastectomy wound has healed.     Past Medical History:  Diagnosis Date  . Allergy   . Ankle fracture, left   . Breast cancer ([place]) 1115/16   left   . Bursitis of right shoulder   . Cataract   . Family history of cancer   . Fracture of right wrist   . GERD (gastroesophageal reflux disease)   . Hyperlipidemia   . Hypertension   . Skin cancer 2000   melanoma and basal cell  . Vaso vagal episode    during preparation for colonoscopy    Current Outpatient Prescriptions  Medication Sig Dispense Refill  . alendronate (FOSAMAX) 70 MG tablet TAKE 1 TABLET (70 MG TOTAL) BY MOUTH EVERY SEVEN DAYS. TAKE WITH A FULL GLASS OF WATER AT NOON TIME ON EMPTY STOMACH. 4 tablet 6  . Biotin (BIOTIN 5000) 5 MG CAPS Take 5 mg by mouth daily.     . Calcium Carbonate-Vitamin D (CALTRATE 600+D) 600-400  MG-UNIT per tablet Take 1 tablet by mouth daily.     . Cholecalciferol (VITAMIN D3) 1000 UNITS CAPS Take 1 capsule by mouth daily.      . cycloSPORINE (RESTASIS) 0.05 % ophthalmic emulsion PLACE 1 DROP INTO BOTH EYES TWO (TWO) TIMES DAILY.    [name] diltiazem (CARDIZEM CD) 180 MG 24 hr capsule TAKE 1 CAPSULE (180 MG TOTAL) BY MOUTH DAILY. 30 capsule 11  . ELIQUIS 5 MG TABS tablet TAKE 1 TABLET (5 MG TOTAL) BY MOUTH TWO TIMES DAILY. 60 tablet 1  . flecainide (TAMBOCOR) 50 MG tablet Take 1 tablet (50 mg total) by mouth 2 (two) times daily. 60 tablet 3  . hydrocortisone (ANUSOL-HC) 25 MG suppository Place 1 suppository (25 mg total) rectally 2 (two) times daily. 12 suppository 0  . ketoconazole (NIZORAL) 2 % cream Apply 1 application topically daily as needed for irritation.     [name] loratadine (CLARITIN) 10 MG tablet Take 10 mg by mouth daily.    . Omega-3 Fatty Acids (FISH OIL PO) Take 1 tablet by mouth daily.     . pravastatin (PRAVACHOL) 40 MG tablet Take 1 tablet (40 mg total) by mouth daily. Repeat labs are due now 90 tablet 1  . Propylene Glycol 0.6 % SOLN Apply 1 drop to eye daily as needed (dry eyes).     [name]  Psyllium (METAMUCIL PO) Take 15 mLs by mouth daily.     . Turmeric 500 MG CAPS Take 1 capsule by mouth daily.      No current facility-administered medications for this encounter.    Facility-Administered Medications Ordered in Other Encounters  Medication Dose Route Frequency Provider Last Rate Last Dose  . sodium chloride 0.9 % injection 10 mL  10 mL Intracatheter PRN Nicholas Lose, MD   10 mL at 09/21/15 1717    Allergies  Allergen Reactions  . Sulfa Antibiotics Other (See Comments)    As a child, rigid as a stick and not responsive  . Tape Other (See Comments)    Blisters, Please use "paper" tape only for short periods of time  . Glucosamine Forte [Nutritional Supplements] Rash  . Latex Rash      Social History   Social History  . Marital status: Widowed    Spouse name: N/A    . Number of children: N/A  . Years of education: N/A   Occupational History  . teacher Retired    retired   Social History Main Topics  . Smoking status: Never Smoker  . Smokeless tobacco: Never Used  . Alcohol use No  . Drug use: No  . Sexual activity: No   Other Topics Concern  . Not on file   Social History Narrative  . No narrative on file      Family History  Problem Relation Age of Onset  . Cancer Mother        mets to bone--? primary  . Coronary artery disease Brother        died of M! @ 18  . Diabetes Brother   . Hyperlipidemia Brother   . Hypertension Brother   . Heart disease Brother        chf  . Heart disease Father 28       MI  . Lung cancer Sister 38       former smoker  . Diabetes Maternal Aunt   . Lung cancer Unknown   . Thrombosis Unknown        thromboembolism clotting disorder--? father died of ? clot     Vitals:   2017/05/09 1107  BP: 138/84  Pulse: 68  SpO2: 97%  Weight: 156 lb 12.8 oz (71.1 kg)    PHYSICAL EXAM: General:  Well appearing. No resp difficulty HEENT: normal Neck: supple. no JVD. Carotids 2+ bilat; no bruits. No lymphadenopathy or thryomegaly appreciated. Cor: PMI nondisplaced. Regular rate & rhythm. 2/6 SEM LSB Lungs: clear Abdomen: obese soft, nontender, nondistended. No hepatosplenomegaly. No bruits or masses. Good bowel sounds. Extremities: no cyanosis, clubbing, rash, edema Neuro: alert & orientedx3, cranial nerves grossly intact. moves all 4 extremities w/o difficulty. Affect pleasant   ASSESSMENT & PLAN: 1. PAF with RVR     --CHADSVASC 4 (age (4), HTN, female)      --echo normal     --maintaining NSR on flecainide with only rare breakthrough.     --Continue flecainide, diltiazem and Eliquis. Discussed the fact that if she has another breakthrough of AF that lasts more than 6 hours can take extra 50mg  flecainide.     --check ECG  2. Breast CA     - s/p mastectomy.  Matayah Reyburn,MD 11:47 AM

## 2017-05-08 ENCOUNTER — Encounter: Payer: Self-pay | Admitting: Family Medicine

## 2017-05-08 ENCOUNTER — Telehealth: Payer: Self-pay

## 2017-05-08 ENCOUNTER — Ambulatory Visit: Payer: Medicare PPO | Admitting: Family Medicine

## 2017-05-08 VITALS — BP 120/90 | HR 68 | Temp 97.8°F | Ht 61.0 in | Wt 154.8 lb

## 2017-05-08 DIAGNOSIS — Z130 Encounter for screening for diseases of the blood and blood-forming organs and certain disorders involving the immune mechanism: Secondary | ICD-10-CM | POA: Diagnosis not present

## 2017-05-08 DIAGNOSIS — E785 Hyperlipidemia, unspecified: Secondary | ICD-10-CM

## 2017-05-08 DIAGNOSIS — M791 Myalgia, unspecified site: Secondary | ICD-10-CM | POA: Diagnosis not present

## 2017-05-08 DIAGNOSIS — I10 Essential (primary) hypertension: Secondary | ICD-10-CM

## 2017-05-08 LAB — COMPREHENSIVE METABOLIC PANEL
ALT: 24 U/L (ref 0–35)
AST: 21 U/L (ref 0–37)
Albumin: 4.2 g/dL (ref 3.5–5.2)
Alkaline Phosphatase: 57 U/L (ref 39–117)
BUN: 11 mg/dL (ref 6–23)
CO2: 32 mEq/L (ref 19–32)
Calcium: 10.3 mg/dL (ref 8.4–10.5)
Chloride: 102 mEq/L (ref 96–112)
Creatinine, Ser: 0.71 mg/dL (ref 0.40–1.20)
GFR: 83.27 mL/min (ref >60.00)
Glucose, Bld: 100 mg/dL — ABNORMAL HIGH (ref 70–99)
Potassium: 4.1 mEq/L (ref 3.5–5.1)
Sodium: 141 mEq/L (ref 135–145)
Total Bilirubin: 0.5 mg/dL (ref 0.2–1.2)
Total Protein: 6.7 g/dL (ref 6.0–8.3)

## 2017-05-08 LAB — LIPID PANEL
Cholesterol: 156 mg/dL (ref 0–200)
HDL: 66.6 mg/dL (ref >39.00)
LDL Cholesterol: 75 mg/dL (ref 0–99)
NonHDL: 89.75
Total CHOL/HDL Ratio: 2
Triglycerides: 76 mg/dL (ref 0.0–149.0)
VLDL: 15.2 mg/dL (ref 0.0–40.0)

## 2017-05-08 LAB — CBC
HCT: 45.3 % (ref 36.0–46.0)
Hemoglobin: 15.1 g/dL — ABNORMAL HIGH (ref 12.0–15.0)
MCHC: 33.4 g/dL (ref 30.0–36.0)
MCV: 93.7 fl (ref 78.0–100.0)
Platelets: 236 10*3/uL (ref 150.0–400.0)
RBC: 4.83 Mil/uL (ref 3.87–5.11)
RDW: 13.9 % (ref 11.5–15.5)
WBC: 6.9 10*3/uL (ref 4.0–10.5)

## 2017-05-08 MED ORDER — METHOCARBAMOL 500 MG PO TABS: ORAL_TABLET | ORAL | 0 refills | Status: DC

## 2017-05-08 NOTE — Telephone Encounter (Signed)
PA initiated via Covermymeds; KEY: [id number]. Awaiting determination.

## 2017-05-08 NOTE — Patient Instructions (Signed)
It seems that you have muscle pain in your right trapezius muscle Please try some heat- gentle massage may help as well I gave you an rx for robaxin- a muscle relaxer- to use as needed. However this medication can make you drowsy- do not use if you need to drive Please let me know if you are not feeling better in the next few days- Sooner if worse or if your symptoms change at all

## 2017-05-08 NOTE — Progress Notes (Addendum)
[place] at [facility] [address], [place], [place] [address] [phone]  Date:  05/08/2017   Name:  Jessica Mullins   DOB:  05-Jan-1931   MRN:  [id number]  PCP:  Ann Held, DO    Chief Complaint: Back Pain (c/o upper back pain x 1 week. )   History of Present Illness:  Jessica Mullins is a 83 y.o. very pleasant female patient who presents with the following:  Pt of Dr. Etter Sjogren, here today with concern of right shoulder/ upper back pain  She has a history of a fib, HTN, melanoma, hyperlipidemia, LEFT breast cancer  She notes that a couple of days ago she noted a pain in her right upper back and shoulder This has continued to bother her over the last couple of days. It is fine if she holds still, but will hurt when she moves her body. She feels a bit lightheaded She saw her cardiologist about 3 weeks ago and all seemed to be well  The pain is only on the right side She has not noted any rash No fever or chills noted Her BP is higher today than usual   She first went into a fib with RVR in 2017- she was to have an ablation but was in NSR, she started on flecainide.  Has rarely been in a fib since  No CP or SOB She tried some tylenol and some ice on the painful area but it has not helped all that much  She is allergic to sulfa BP Readings from Last 3 Encounters:  05/08/17 (!) 177/84  04/21/17 138/84  02/14/17 122/76  no GI symptoms   Patient Active Problem List   Diagnosis Date Noted  . Hemorrhoids 02/14/2017  . Atrial fibrillation ([place]) 08/10/2015  . Orthostatic hypotension 08/04/2015  . Leukocytosis 07/27/2015  . Genetic testing 07/27/2015  . Family history of cancer   . Cancer of central portion of left female breast ([place]) 06/01/2015  . Bilateral low back pain without sciatica 11/18/2014  . Right shoulder pain 04/10/2014  . Muscle spasm of back 01/13/2014  . Olecranon bursitis of left elbow  08/26/2013  . Posterolateral cervical muscle strain 12/14/2012  . Hyperlipidemia 02/16/2010  . TMJ PAIN 02/16/2010  . SEBORRHEIC KERATOSIS 08/24/2009  . Essential hypertension 10/18/2007  . OSTEOPENIA 07/12/2007  . Melanoma of skin ([place]) 10/17/2006  . BREAST CYST 10/17/2006  . COLONOSCOPY, HX OF 10/17/2006    Past Medical History:  Diagnosis Date  . Allergy   . Ankle fracture, left   . Breast cancer ([place]) 1115/16   left   . Bursitis of right shoulder   . Cataract   . Family history of cancer   . Fracture of right wrist   . GERD (gastroesophageal reflux disease)   . Hyperlipidemia   . Hypertension   . Skin cancer 2000   melanoma and basal cell  . Vaso vagal episode    during preparation for colonoscopy    Past Surgical History:  Procedure Laterality Date  . ABDOMINAL HYSTERECTOMY  1988   TAH/BSO--FIBROIDS  . APPENDECTOMY    . BREAST LUMPECTOMY     B/L--FCS  . CATARACT EXTRACTION  2015  . COLONOSCOPY  2010  . DG  BONE DENSITY ([facility])    . EYE SURGERY Bilateral    cataracts  . fiberadenoma Bilateral 1978, 1980  . GANGLION CYST EXCISION     L  hand  . Lipiflow procedure      Social History   Tobacco Use  . Smoking status: Never Smoker  . Smokeless tobacco: Never Used  Substance Use Topics  . Alcohol use: No  . Drug use: No    Family History  Problem Relation Age of Onset  . Cancer Mother        mets to bone--? primary  . Coronary artery disease Brother        died of M! @ 10  . Diabetes Brother   . Hyperlipidemia Brother   . Hypertension Brother   . Heart disease Brother        chf  . Heart disease Father 30       MI  . Lung cancer Sister 33       former smoker  . Diabetes Maternal Aunt   . Lung cancer Unknown   . Thrombosis Unknown        thromboembolism clotting disorder--? father died of ? clot     Allergies  Allergen Reactions  . Sulfa Antibiotics Other (See Comments)    As a child, rigid as a stick and not responsive  . Tape Other  (See Comments)    Blisters, Please use "paper" tape only for short periods of time  . Glucosamine Forte [Nutritional Supplements] Rash  . Latex Rash    Medication list has been reviewed and updated.  Current Outpatient Medications on File Prior to Visit  Medication Sig Dispense Refill  . alendronate (FOSAMAX) 70 MG tablet TAKE 1 TABLET (70 MG TOTAL) BY MOUTH EVERY SEVEN DAYS. TAKE WITH A FULL GLASS OF WATER AT NOON TIME ON EMPTY STOMACH. 4 tablet 6  . Biotin (BIOTIN 5000) 5 MG CAPS Take 5 mg by mouth daily.     . Calcium Carbonate-Vitamin D (CALTRATE 600+D) 600-400 MG-UNIT per tablet Take 1 tablet by mouth daily.     . Cholecalciferol (VITAMIN D3) 1000 UNITS CAPS Take 1 capsule by mouth daily.      . cycloSPORINE (RESTASIS) 0.05 % ophthalmic emulsion PLACE 1 DROP INTO BOTH EYES TWO (TWO) TIMES DAILY.    [name] diltiazem (CARDIZEM CD) 180 MG 24 hr capsule TAKE 1 CAPSULE (180 MG TOTAL) BY MOUTH DAILY. 30 capsule 11  . ELIQUIS 5 MG TABS tablet TAKE 1 TABLET (5 MG TOTAL) BY MOUTH TWO TIMES DAILY. 60 tablet 1  . flecainide (TAMBOCOR) 50 MG tablet Take 1 tablet (50 mg total) by mouth 2 (two) times daily. 60 tablet 3  . hydrocortisone (ANUSOL-HC) 25 MG suppository Place 1 suppository (25 mg total) rectally 2 (two) times daily. 12 suppository 0  . ketoconazole (NIZORAL) 2 % cream Apply 1 application topically daily as needed for irritation.     [name] loratadine (CLARITIN) 10 MG tablet Take 10 mg by mouth daily.    . Omega-3 Fatty Acids (FISH OIL PO) Take 1 tablet by mouth daily.     . pravastatin (PRAVACHOL) 40 MG tablet Take 1 tablet (40 mg total) by mouth daily. Repeat labs are due now 90 tablet 1  . Propylene Glycol 0.6 % SOLN Apply 1 drop to eye daily as needed (dry eyes).     . Psyllium (METAMUCIL PO) Take 15 mLs by mouth daily.     . Turmeric 500 MG CAPS Take 1 capsule by mouth daily.      Current Facility-Administered Medications on File Prior to Visit  Medication Dose Route Frequency Provider Last  Rate Last Dose  . sodium chloride  0.9 % injection 10 mL  10 mL Intracatheter PRN Nicholas Lose, MD   10 mL at 09/21/15 1717    Review of Systems:  As per HPI- otherwise negative.   Physical Examination: Vitals:   05/08/17 1217  BP: (!) 177/84  Pulse: 68  Temp: 97.8 F (36.6 C)  SpO2: 99%   Vitals:   05/08/17 1217  Weight: 154 lb 12.8 oz (70.2 kg)  Height: 5\' 1"  (1.549 m)   Body mass index is 29.25 kg/m. Ideal Body Weight: Weight in (lb) to have BMI = 25: 132  GEN: WDWN, NAD, Non-toxic, A & O x 3, overweight, well appearing older lady HEENT: Atraumatic, Normocephalic. Neck supple. No masses, No LAD.  Bilateral TM wnl, oropharynx normal.  PEERL,EOMI.   Ears and Nose: No external deformity. CV: RRR, No M/G/R. No JVD. No thrill. No extra heart sounds. PULM: CTA B, no wheezes, crackles, rhonchi. No retractions. No resp. distress. No accessory muscle use. ABD: S, NT, ND EXTR: No c/c/e NEURO Normal gait.  PSYCH: Normally interactive. Conversant. Not depressed or anxious appearing.  Calm demeanor.  She is tender and sore along the superior right trapezius muscle. She indicates that this is the area where she has been feeling pain Normal ROM of her neck and shoulders  No rash, swelling or redness noted   Assessment and Plan: Muscle pain - Plan: methocarbamol (ROBAXIN) 500 MG tablet  Essential hypertension - Plan: Comprehensive metabolic panel  Screening for deficiency anemia - Plan: CBC  Hyperlipidemia LDL goal <100 - Plan: Lipid panel  Here today with soreness of the muscles in her right upper back.  Her pain is reproducible on palpation and I do not see any other sign if a problem.  Will treat with a cautious dose of robaxin She is due for labs today and would like to have these drawn which is fine- will order for her today  Signed Lamar Blinks, MD  Received her labs, message to pt  Results for orders placed or performed in visit on 05/08/17  CBC  Result Value  Ref Range   WBC 6.9 4.0 - 10.5 K/uL   RBC 4.83 3.87 - 5.11 Mil/uL   Platelets 236.0 150.0 - 400.0 K/uL   Hemoglobin 15.1 (H) 12.0 - 15.0 g/dL   HCT 45.3 36.0 - 46.0 %   MCV 93.7 78.0 - 100.0 fl   MCHC 33.4 30.0 - 36.0 g/dL   RDW 13.9 11.5 - 15.5 %  Comprehensive metabolic panel  Result Value Ref Range   Sodium 141 135 - 145 mEq/L   Potassium 4.1 3.5 - 5.1 mEq/L   Chloride 102 96 - 112 mEq/L   CO2 32 19 - 32 mEq/L   Glucose, Bld 100 (H) 70 - 99 mg/dL   BUN 11 6 - 23 mg/dL   Creatinine, Ser 0.71 0.40 - 1.20 mg/dL   Total Bilirubin 0.5 0.2 - 1.2 mg/dL   Alkaline Phosphatase 57 39 - 117 U/L   AST 21 0 - 37 U/L   ALT 24 0 - 35 U/L   Total Protein 6.7 6.0 - 8.3 g/dL   Albumin 4.2 3.5 - 5.2 g/dL   Calcium 10.3 8.4 - 10.5 mg/dL   GFR 83.27 >60.00 mL/min  Lipid panel  Result Value Ref Range   Cholesterol 156 0 - 200 mg/dL   Triglycerides 76.0 0.0 - 149.0 mg/dL   HDL 66.60 >39.00 mg/dL   VLDL 15.2 0.0 - 40.0 mg/dL   LDL Cholesterol 75  0 - 99 mg/dL   Total CHOL/HDL Ratio 2    NonHDL 89.75

## 2017-05-09 NOTE — Telephone Encounter (Signed)
PA Case: [id number], Status: Denied. Notification: Completed. Preferred alternatives: baclofen, tizanidine. Please advise.

## 2017-05-10 NOTE — Telephone Encounter (Signed)
Called her- she did not get the robaxin but does not need it as she is better now anyway.  She will let me know if any other needs

## 2017-05-15 DIAGNOSIS — Z853 Personal history of malignant neoplasm of breast: Secondary | ICD-10-CM | POA: Diagnosis not present

## 2017-05-15 DIAGNOSIS — R928 Other abnormal and inconclusive findings on diagnostic imaging of breast: Secondary | ICD-10-CM | POA: Diagnosis not present

## 2017-05-15 LAB — HM MAMMOGRAPHY

## 2017-06-10 ENCOUNTER — Other Ambulatory Visit (HOSPITAL_COMMUNITY): Payer: Self-pay | Admitting: Internal Medicine

## 2017-06-10 ENCOUNTER — Other Ambulatory Visit: Payer: Self-pay | Admitting: Family Medicine

## 2017-06-12 ENCOUNTER — Other Ambulatory Visit: Payer: Self-pay | Admitting: *Deleted

## 2017-06-12 MED ORDER — ALENDRONATE SODIUM 70 MG PO TABS: ORAL_TABLET | ORAL | 5 refills | Status: DC

## 2017-06-12 NOTE — Addendum Note (Signed)
Addended by: Kem Boroughs D on: 06/12/2017 11:11 AM   Modules accepted: Orders

## 2017-06-13 ENCOUNTER — Encounter: Payer: Self-pay | Admitting: *Deleted

## 2017-06-14 ENCOUNTER — Other Ambulatory Visit: Payer: Self-pay

## 2017-06-14 ENCOUNTER — Other Ambulatory Visit (HOSPITAL_COMMUNITY): Payer: Self-pay | Admitting: *Deleted

## 2017-06-14 DIAGNOSIS — I4819 Other persistent atrial fibrillation: Secondary | ICD-10-CM

## 2017-06-14 MED ORDER — APIXABAN 5 MG PO TABS: ORAL_TABLET | ORAL | 0 refills | Status: DC

## 2017-06-15 ENCOUNTER — Other Ambulatory Visit: Payer: Self-pay

## 2017-06-15 DIAGNOSIS — I4819 Other persistent atrial fibrillation: Secondary | ICD-10-CM

## 2017-06-15 MED ORDER — APIXABAN 5 MG PO TABS: ORAL_TABLET | ORAL | 1 refills | Status: DC

## 2017-06-15 NOTE — Telephone Encounter (Signed)
Returned pt call regarding eliquis refill. I had refilled for 30 tablets. Pt takes 2x daily. Sent in new refill for 60 tablets.  Cyndia Bent RN

## 2017-07-02 ENCOUNTER — Telehealth: Payer: Self-pay | Admitting: Hematology and Oncology

## 2017-07-02 NOTE — Telephone Encounter (Signed)
S/w pt, advised of appt time chg on 1/24 from 1.45 to 9.30am.

## 2017-07-19 NOTE — Assessment & Plan Note (Signed)
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 Adjuvant chemotherapy completed 09/21/2015 Taxotere and Cytoxan 4 cycles Adjuvant radiation delayed Completed 02/17/2016  Breast Cancer Surveillance: 1. Breast exam 07/20/17: Scar tissue in the left breast inferior aspect related to prior surgery 2. Mammogram 05/15/17 No abnormalities. Postsurgical changes. Breast Density Category C.   Prior history of atrial fibrillation: Currently on Eliquis, Cardizem and flecainide..  Prior history of melanoma  Return to clinic in 1 year for follow-up and surveillance

## 2017-07-20 ENCOUNTER — Telehealth: Payer: Self-pay | Admitting: Hematology and Oncology

## 2017-07-20 ENCOUNTER — Inpatient Hospital Stay: Payer: Medicare PPO | Attending: Hematology and Oncology | Admitting: Hematology and Oncology

## 2017-07-20 DIAGNOSIS — Z853 Personal history of malignant neoplasm of breast: Secondary | ICD-10-CM

## 2017-07-20 DIAGNOSIS — I4891 Unspecified atrial fibrillation: Secondary | ICD-10-CM | POA: Diagnosis not present

## 2017-07-20 DIAGNOSIS — Z171 Estrogen receptor negative status [ER-]: Secondary | ICD-10-CM

## 2017-07-20 DIAGNOSIS — Z7901 Long term (current) use of anticoagulants: Secondary | ICD-10-CM | POA: Insufficient documentation

## 2017-07-20 DIAGNOSIS — Z85820 Personal history of malignant melanoma of skin: Secondary | ICD-10-CM | POA: Insufficient documentation

## 2017-07-20 DIAGNOSIS — C50112 Malignant neoplasm of central portion of left female breast: Secondary | ICD-10-CM

## 2017-07-20 NOTE — Progress Notes (Signed)
Patient Care Team: Carollee Herter, Alferd Apa, DO as PCP - General Nicholas Lose, MD as Consulting Physician (Hematology and Oncology) Erroll Luna, MD as Consulting Physician (General Surgery)  DIAGNOSIS:  Encounter Diagnosis  Name Primary?  . Malignant neoplasm of central portion of left breast in female, estrogen receptor negative ([place])     SUMMARY OF ONCOLOGIC HISTORY:   Cancer of central portion of left female breast ([place])   05/04/2015 Mammogram     left breast distortion , breast density category A , 7 mm by ultrasound at 3:00 position middle depth      05/12/2015 Initial Diagnosis     left breast biopsy: invasive ductal cancer with DCIS , ER 0%, PR 0%, Ki-67 5%, HER-2 negative ratio 0.83      06/11/2015 Surgery    Left lumpectomy ([place]): DCIS, left additional margin: IDC 1.3 cm + DCIS 1/4 LN positive, grade 2, margins negative, LVI present, ER 0%, PR 0%, HER-2 negative ratio 0.60, Ki-67 5%, T1cN1a stage II a, Mammaprint high risk luminal B      07/20/2015 - 09/21/2015 Chemotherapy    Taxotere and Cytoxan adjuvant chemotherapy  4 cycles      07/24/2015 Procedure    Genetic testing: Negative Ashkenazi Founder mutation panel      01/21/2016 - 02/17/2016 Radiation Therapy    Adjuvant radiation therapy Lisbeth Renshaw). Left breast: 42.5 Gy in 17 fractions. Left breast boost: 7.5 Gy in 3 fractions.        CHIEF COMPLIANT: Follow-up and surveillance of breast cancer  INTERVAL HISTORY: Jessica Mullins is a 84 year old with above-mentioned history of left breast cancer who underwent left lumpectomy followed by adjuvant chemotherapy and radiation.  She is currently in surveillance.  She reports no pain or discomfort in the breast.  She does have occasional dizziness especially when she stands up.  She had a couple of episodes of bronchitis for which she required antibiotics.  REVIEW OF SYSTEMS:   Constitutional: Denies fevers, chills or abnormal weight loss Eyes: Denies blurriness  of vision Ears, nose, mouth, throat, and face: Denies mucositis or sore throat Respiratory: Denies cough, dyspnea or wheezes Cardiovascular: Denies palpitation, chest discomfort Gastrointestinal:  Denies nausea, heartburn or change in bowel habits Skin: Denies abnormal skin rashes Lymphatics: Denies new lymphadenopathy or easy bruising Neurological:Denies numbness, tingling or new weaknesses Behavioral/Psych: Mood is stable, no new changes  Extremities: No lower extremity edema Breast:  denies any pain or lumps or nodules in either breasts All other systems were reviewed with the patient and are negative.  I have reviewed the past medical history, past surgical history, social history and family history with the patient and they are unchanged from previous note.  ALLERGIES:  is allergic to sulfa antibiotics; tape; glucosamine forte [nutritional supplements]; and latex.  MEDICATIONS:  Current Outpatient Medications  Medication Sig Dispense Refill  . alendronate (FOSAMAX) 70 MG tablet TAKE 1 TABLET (70 MG TOTAL) BY MOUTH EVERY SEVEN DAYS. TAKE WITH A FULL GLASS OF WATER AT NOON TIME ON EMPTY STOMACH. 4 tablet 5  . apixaban (ELIQUIS) 5 MG TABS tablet TAKE 1 TABLET (5 MG TOTAL) BY MOUTH TWO TIMES DAILY. 60 tablet 1  . Biotin (BIOTIN 5000) 5 MG CAPS Take 5 mg by mouth daily.     . Calcium Carbonate-Vitamin D (CALTRATE 600+D) 600-400 MG-UNIT per tablet Take 1 tablet by mouth daily.     . Cholecalciferol (VITAMIN D3) 1000 UNITS CAPS Take 1 capsule by mouth daily.      [name]  cycloSPORINE (RESTASIS) 0.05 % ophthalmic emulsion PLACE 1 DROP INTO BOTH EYES TWO (TWO) TIMES DAILY.    [name] diltiazem (CARDIZEM CD) 180 MG 24 hr capsule TAKE 1 CAPSULE (180 MG TOTAL) BY MOUTH DAILY. 30 capsule 11  . flecainide (TAMBOCOR) 50 MG tablet TAKE 1 TABLET (50 MG TOTAL) BY MOUTH TWO TIMES DAILY. 60 tablet 3  . hydrocortisone (ANUSOL-HC) 25 MG suppository Place 1 suppository (25 mg total) rectally 2 (two) times daily. 12  suppository 0  . ketoconazole (NIZORAL) 2 % cream Apply 1 application topically daily as needed for irritation.     [name] loratadine (CLARITIN) 10 MG tablet Take 10 mg by mouth daily.    . methocarbamol (ROBAXIN) 500 MG tablet Take 1/2 or 1 every 8 hours as needed for muscle pain 20 tablet 0  . Omega-3 Fatty Acids (FISH OIL PO) Take 1 tablet by mouth daily.     . pravastatin (PRAVACHOL) 40 MG tablet Take 1 tablet (40 mg total) by mouth daily. Repeat labs are due now 90 tablet 1  . Propylene Glycol 0.6 % SOLN Apply 1 drop to eye daily as needed (dry eyes).     . Psyllium (METAMUCIL PO) Take 15 mLs by mouth daily.     . Turmeric 500 MG CAPS Take 1 capsule by mouth daily.      No current facility-administered medications for this visit.    Facility-Administered Medications Ordered in Other Visits  Medication Dose Route Frequency Provider Last Rate Last Dose  . sodium chloride 0.9 % injection 10 mL  10 mL Intracatheter PRN Nicholas Lose, MD   10 mL at 09/21/15 1717    PHYSICAL EXAMINATION: ECOG PERFORMANCE STATUS: 1 - Symptomatic but completely ambulatory  Vitals:   07/20/17 0918  BP: (!) 151/77  Pulse: 67  Resp: 17  Temp: 98.5 F (36.9 C)  SpO2: 99%   Filed Weights   07/20/17 0918  Weight: 157 lb 6.4 oz (71.4 kg)    GENERAL:alert, no distress and comfortable SKIN: skin color, texture, turgor are normal, no rashes or significant lesions EYES: normal, Conjunctiva are pink and non-injected, sclera clear OROPHARYNX:no exudate, no erythema and lips, buccal mucosa, and tongue normal  NECK: supple, thyroid normal size, non-tender, without nodularity LYMPH:  no palpable lymphadenopathy in the cervical, axillary or inguinal LUNGS: clear to auscultation and percussion with normal breathing effort HEART: regular rate & rhythm and no murmurs and no lower extremity edema ABDOMEN:abdomen soft, non-tender and normal bowel sounds MUSCULOSKELETAL:no cyanosis of digits and no clubbing  NEURO:  alert & oriented x 3 with fluent speech, no focal motor/sensory deficits EXTREMITIES: No lower extremity edema  LABORATORY DATA:  I have reviewed the data as listed CMP Latest Ref Rng & Units 05/08/2017 11/07/2016 08/29/2016  Glucose 70 - 99 mg/dL 100(H) 125(H) 93  BUN 6 - 23 mg/dL 11 13 13   Creatinine 0.40 - 1.20 mg/dL 0.71 1.01(H) 0.72  Sodium 135 - 145 mEq/L 141 136 141  Potassium 3.5 - 5.1 mEq/L 4.1 3.3(L) 3.8  Chloride 96 - 112 mEq/L 102 99(L) 102  CO2 19 - 32 mEq/L 32 27 29  Calcium 8.4 - 10.5 mg/dL 10.3 9.0 10.0  Total Protein 6.0 - 8.3 g/dL 6.7 6.2(L) 6.8  Total Bilirubin 0.2 - 1.2 mg/dL 0.5 0.7 0.6  Alkaline Phos 39 - 117 U/L 57 53 62  AST 0 - 37 U/L 21 27 17   ALT 0 - 35 U/L 24 21 17     Lab Results  Component  Value Date   WBC 6.9 05/08/2017   HGB 15.1 (H) 05/08/2017   HCT 45.3 05/08/2017   MCV 93.7 05/08/2017   PLT 236.0 05/08/2017   NEUTROABS 4.9 11/07/2016    ASSESSMENT & PLAN:  Cancer of central portion of left female breast ([place]) Left lumpectomy 06/11/2015: DCIS, left additional margin: IDC 1.3 cm + DCIS 1/4 LN positive, grade 2, margins negative, LVI present, ER 0%, PR 0%, HER-2 negative ratio 0.60, Ki-67 5%, T1cN1a stage II a , Mammaprint high risk ( 5 year risk 22%,10 year risk 29%) luminal type B, Distant metastasis free survival at 5 years 88% with chemotherapy. Adjuvant chemotherapy completed 09/21/2015 Taxotere and Cytoxan 4 cycles Adjuvant radiation delayed Completed 02/17/2016  Breast Cancer Surveillance: 1. Breast exam 07/20/17: Scar tissue in the left breast inferior aspect related to prior surgery 2. Mammogram 05/15/17 No abnormalities. Postsurgical changes. Breast Density Category C.   Prior history of atrial fibrillation: Currently on Eliquis, Cardizem and flecainide..  Prior history of melanoma  Return to clinic in 1 year for follow-up and surveillance  I spent 25 minutes talking to the patient of which more than half was spent in counseling  and coordination of care.  No orders of the defined types were placed in this encounter.  The patient has a good understanding of the overall plan. she agrees with it. she will call with any problems that may develop before the next visit here.   Harriette Ohara, MD 07/20/17

## 2017-07-20 NOTE — Telephone Encounter (Signed)
Gave patient AVs and calendar of upcoming January 2020 appointments.  °

## 2017-08-08 ENCOUNTER — Other Ambulatory Visit: Payer: Self-pay | Admitting: Hematology and Oncology

## 2017-08-08 DIAGNOSIS — I4819 Other persistent atrial fibrillation: Secondary | ICD-10-CM

## 2017-08-17 ENCOUNTER — Other Ambulatory Visit: Payer: Self-pay | Admitting: Family Medicine

## 2017-08-17 DIAGNOSIS — E785 Hyperlipidemia, unspecified: Secondary | ICD-10-CM

## 2017-08-30 ENCOUNTER — Other Ambulatory Visit: Payer: Self-pay | Admitting: Hematology and Oncology

## 2017-08-30 DIAGNOSIS — I4819 Other persistent atrial fibrillation: Secondary | ICD-10-CM

## 2017-09-14 ENCOUNTER — Other Ambulatory Visit: Payer: Self-pay | Admitting: Hematology and Oncology

## 2017-09-14 DIAGNOSIS — I4819 Other persistent atrial fibrillation: Secondary | ICD-10-CM

## 2017-09-22 ENCOUNTER — Other Ambulatory Visit (HOSPITAL_COMMUNITY): Payer: Self-pay | Admitting: *Deleted

## 2017-09-22 MED ORDER — FLECAINIDE ACETATE 50 MG PO TABS: ORAL_TABLET | ORAL | 3 refills | Status: DC

## 2017-09-28 ENCOUNTER — Other Ambulatory Visit (HOSPITAL_COMMUNITY): Payer: Self-pay | Admitting: Internal Medicine

## 2017-10-06 DIAGNOSIS — H00011 Hordeolum externum right upper eyelid: Secondary | ICD-10-CM | POA: Diagnosis not present

## 2017-10-27 DIAGNOSIS — S51811A Laceration without foreign body of right forearm, initial encounter: Secondary | ICD-10-CM | POA: Diagnosis not present

## 2017-11-06 NOTE — Progress Notes (Signed)
Subjective:   Jessica Mullins is a 84 y.o. female who presents for Medicare Annual (Subsequent) preventive examination.  Review of Systems: No ROS.  Medicare Wellness Visit. Additional risk factors are reflected in the social history. Cardiac Risk Factors include: advanced age (>45men, >31 women);dyslipidemia;hypertension Sleep patterns: Wakes occasionally to urinate. Sleeps about 6 hrs.  Home Safety/Smoke Alarms: Feels safe in home. Smoke alarms in place.  Living environment; residence and Firearm Safety: 1 story. Lives alone. Emergency call necklace.    Female:   Mammo-   utd    Dexa scan-   utd         Objective:     Vitals: BP 140/76 (BP Location: Left Arm, Patient Position: Sitting, Cuff Size: Normal)   Pulse (!) 59   Ht 5\' 1"  (1.549 m)   Wt 157 lb (71.2 kg)   SpO2 98%   BMI 29.66 kg/m   Body mass index is 29.66 kg/m.  Advanced Directives 11/09/2017 11/08/2016 11/07/2016 09/13/2016 09/01/2016 07/20/2016 01/06/2016  Does Patient Have a Medical Advance Directive? Yes Yes Yes Yes Yes No Yes  Type of Paramedic of [place];Living will - - [place];Living will - - [place];Living will  Does patient want to make changes to medical advance directive? - - - No - Patient declined No - Patient declined - -  Copy of [place] in Chart? No - copy requested - - No - copy requested - - -    Tobacco Social History   Tobacco Use  Smoking Status Never Smoker  Smokeless Tobacco Never Used     Counseling given: Not Answered   Clinical Intake: Pain : No/denies pain     Past Medical History:  Diagnosis Date  . Allergy   . Ankle fracture, left   . Breast cancer ([place]) 1115/16   left   . Bursitis of right shoulder   . Cataract   . Family history of cancer   . Fracture of right wrist   . GERD (gastroesophageal reflux disease)   . Hyperlipidemia   . Hypertension   . Skin cancer 2000   melanoma  and basal cell  . Vaso vagal episode    during preparation for colonoscopy   Past Surgical History:  Procedure Laterality Date  . ABDOMINAL HYSTERECTOMY  1988   TAH/BSO--FIBROIDS  . APPENDECTOMY    . BREAST LUMPECTOMY     B/L--[facility]  . BREAST LUMPECTOMY WITH RADIOACTIVE SEED AND SENTINEL LYMPH NODE BIOPSY Left 06/11/2015   Procedure: LEFT BREAST LUMPECTOMY WITH RADIOACTIVE SEED AND LEFT SENTINEL LYMPH NODE MAPPING;  Surgeon: Erroll Luna, MD;  Location: [facility];  Service: General;  Laterality: Left;  . CATARACT EXTRACTION  2015  . COLONOSCOPY  2010  . DG  BONE DENSITY ([place] HX)    . EYE SURGERY Bilateral    cataracts  . fiberadenoma Bilateral 1978, 1980  . GANGLION CYST EXCISION     L  hand  . Lipiflow procedure     Family History  Problem Relation Age of Onset  . Cancer Mother        mets to bone--? primary  . Coronary artery disease Brother        died of M! @ 44  . Diabetes Brother   . Hyperlipidemia Brother   . Hypertension Brother   . Heart disease Brother        chf  . Heart disease Father 75  MI  . Lung cancer Sister 83       former smoker  . Diabetes Maternal Aunt   . Lung cancer Unknown   . Thrombosis Unknown        thromboembolism clotting disorder--? father died of ? clot    Social History   Socioeconomic History  . Marital status: Widowed    Spouse name: Not on file  . Number of children: Not on file  . Years of education: Not on file  . Highest education level: Not on file  Occupational History  . 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 Financial resource strain: Not on file  . Food insecurity:    Worry: Not on file    Inability: Not on file  . Transportation needs:    Medical: Not on file    Non-medical: Not on file  Tobacco Use  . Smoking status: Never Smoker  . Smokeless tobacco: Never Used  Substance and Sexual Activity  . Alcohol use: No  . Drug use: No  . Sexual activity: Never  Lifestyle   . Physical activity:    Days per week: Not on file    Minutes per session: Not on file  . Stress: Not on file  Relationships  . Social connections:    Talks on phone: Not on file    Gets together: Not on file    Attends religious service: Not on file    Active member of club or organization: Not on file    Attends meetings of clubs or organizations: Not on file    Relationship status: Not on file  Other Topics Concern  . Not on file  Social History Narrative  . Not on file    Outpatient Encounter Medications as of 11/09/2017  Medication Sig  . alendronate (FOSAMAX) 70 MG tablet TAKE 1 TABLET (70 MG TOTAL) BY MOUTH EVERY SEVEN DAYS. TAKE WITH A FULL GLASS OF WATER AT NOON TIME ON EMPTY STOMACH.  [name] Biotin (BIOTIN 5000) 5 MG CAPS Take 5 mg by mouth daily.   . Calcium Carbonate-Vitamin D (CALTRATE 600+D) 600-400 MG-UNIT per tablet Take 1 tablet by mouth daily.   . Cholecalciferol (VITAMIN D3) 1000 UNITS CAPS Take 1 capsule by mouth daily.    . cycloSPORINE (RESTASIS) 0.05 % ophthalmic emulsion PLACE 1 DROP INTO BOTH EYES TWO (TWO) TIMES DAILY.  [name] diltiazem (CARDIZEM CD) 180 MG 24 hr capsule TAKE 1 CAPSULE (180 MG TOTAL) BY MOUTH DAILY.  [name] ELIQUIS 5 MG TABS tablet TAKE 1 TABLET (5 MG TOTAL) BY MOUTH TWO TIMES DAILY.  . flecainide (TAMBOCOR) 50 MG tablet TAKE 1 TABLET (50 MG TOTAL) BY MOUTH TWO TIMES DAILY.  [name] ketoconazole (NIZORAL) 2 % cream Apply 1 application topically daily as needed for irritation.   [name] loratadine (CLARITIN) 10 MG tablet Take 10 mg by mouth daily.  . Omega-3 Fatty Acids (FISH OIL PO) Take 1 tablet by mouth daily.   . pravastatin (PRAVACHOL) 40 MG tablet TAKE 1 TABLET (40 MG TOTAL) BY MOUTH DAILY. REPEAT LABS ARE DUE NOW  . Propylene Glycol 0.6 % SOLN Apply 1 drop to eye daily as needed (dry eyes).   . Psyllium (METAMUCIL PO) Take 15 mLs by mouth daily.   . Turmeric 500 MG CAPS Take 1 capsule by mouth daily.   . [DISCONTINUED] pravastatin (PRAVACHOL) 40 MG tablet  Take 1 tablet (40 mg total) by mouth daily. Repeat labs are due  now   Facility-Administered Encounter Medications as of 11/09/2017  Medication  . sodium chloride 0.9 % injection 10 mL    Activities of Daily Living In your present state of health, do you have any difficulty performing the following activities: 11/09/2017  Hearing? N  Vision? N  Comment wears glasses. Eye doctor yearly  Difficulty concentrating or making decisions? N  Walking or climbing stairs? Y  Comment reports difficulty with balance   Dressing or bathing? N  Doing errands, shopping? N  Preparing Food and eating ? N  Using the Toilet? N  In the past six months, have you accidently leaked urine? N  Do you have problems with loss of bowel control? N  Managing your Medications? N  Managing your Finances? N  Housekeeping or managing your Housekeeping? N  Some recent data might be hidden    Patient Care Team: Carollee Herter, Alferd Apa, DO as PCP - General Nicholas Lose, MD as Consulting Physician (Hematology and Oncology) Erroll Luna, MD as Consulting Physician (General Surgery)    Assessment:   This is a routine wellness examination for Jessica Mullins. Physical assessment deferred to PCP.  Exercise Activities and Dietary recommendations Current Exercise Habits: Home exercise routine, Type of exercise: stretching;calisthenics, Time (Minutes): 20, Frequency (Times/Week): 5, Weekly Exercise (Minutes/Week): 100, Intensity: Mild, Exercise limited by: None identified   Diet (meal preparation, eat out, water intake, caffeinated beverages, dairy products, fruits and vegetables): in general, a "healthy" diet  , well balanced Breakfast: 1/2 muffin, dates, grape juice, water and metamucil Lunch: catfish and salad and vegetables. Water  Dinner:   Nature conservation officer.  Goals    . Maintain healthy lifestyle       Fall Risk Fall Risk  11/09/2017 09/13/2016 08/29/2016 03/16/2016 01/06/2016  Falls in the past year? No Yes Yes Yes Yes    Number falls in past yr: - 2 or more 2 or more 1 2 or more  Comment - - - - Fell after chemotherapy said her heart went out of rhythm on cardiac med now  Injury with Fall? - No No No No  Risk Factor Category  - - - - -  Risk for fall due to : - - - - History of fall(s);Impaired vision  Risk for fall due to: Comment - - - - wears glasses  Follow up - Education provided;Falls prevention discussed - - Falls evaluation completed    Depression Screen PHQ 2/9 Scores 11/09/2017 09/13/2016 08/29/2016 03/16/2016  PHQ - 2 Score 0 0 0 0     Cognitive Function MMSE - Mini Mental State Exam 11/09/2017 09/13/2016  Orientation to time 5 5  Orientation to Place 5 5  Registration 3 3  Attention/ Calculation 5 5  Recall 2 3  Language- name 2 objects 2 2  Language- repeat 1 1  Language- follow 3 step command 3 3  Language- read & follow direction 1 1  Write a sentence 1 1  Copy design 1 1  Total score 29 30        Immunization History  Administered Date(s) Administered  . H1N1 07/01/2008  . Influenza Whole 04/10/2007, 04/01/2009, 03/26/2010, 03/25/2012  . Influenza, High Dose Seasonal PF 04/16/2014, 04/09/2015  . Influenza-Unspecified 06/27/2016, 04/03/2017  . Pneumococcal Conjugate-13 05/13/2014  . Pneumococcal Polysaccharide-23 08/29/2016  . Td 11/05/2003  . Tdap 06/28/2011  . Zoster 02/24/2010  . Zoster Recombinat (Shingrix) 10/08/2016     Screening Tests Health Maintenance  Topic Date Due  . INFLUENZA VACCINE  01/25/2018  . MAMMOGRAM  05/15/2018  . TETANUS/TDAP  06/27/2021  . DEXA SCAN  Completed  . PNA vac Low Risk Adult  Completed       Plan:    Please schedule your next medicare wellness visit with me in 1 yr.  Follow up with Dr. Etter Sjogren as scheduled  Continue to eat heart healthy diet (full of fruits, vegetables, whole grains, lean protein, water--limit salt, fat, and sugar intake) and increase physical activity as tolerated.  Continue doing brain stimulating activities  (puzzles, reading, adult coloring books, staying active) to keep memory sharp.   Bring a copy of your living will and/or healthcare power of attorney to your next office visit.  I have personally reviewed and noted the following in the patient's chart:   . Medical and social history . Use of alcohol, tobacco or illicit drugs  . Current medications and supplements . Functional ability and status . Nutritional status . Physical activity . Advanced directives . List of other physicians . Hospitalizations, surgeries, and ER visits in previous 12 months . Vitals . Screenings to include cognitive, depression, and falls . Referrals and appointments  In addition, I have reviewed and discussed with patient certain preventive protocols, quality metrics, and best practice recommendations. A written personalized care plan for preventive services as well as general preventive health recommendations were provided to patient.     Shela Nevin, [place]  11/09/2017

## 2017-11-09 ENCOUNTER — Encounter: Payer: Self-pay | Admitting: *Deleted

## 2017-11-09 ENCOUNTER — Ambulatory Visit (INDEPENDENT_AMBULATORY_CARE_PROVIDER_SITE_OTHER): Payer: Medicare PPO | Admitting: *Deleted

## 2017-11-09 VITALS — BP 140/76 | HR 59 | Ht 61.0 in | Wt 157.0 lb

## 2017-11-09 DIAGNOSIS — Z0000 Encounter for general adult medical examination without abnormal findings: Secondary | ICD-10-CM

## 2017-11-09 NOTE — Patient Instructions (Signed)
Please schedule your next medicare wellness visit with me in 1 yr.  Follow up with Dr. Etter Sjogren as scheduled  Continue to eat heart healthy diet (full of fruits, vegetables, whole grains, lean protein, water--limit salt, fat, and sugar intake) and increase physical activity as tolerated.  Continue doing brain stimulating activities (puzzles, reading, adult coloring books, staying active) to keep memory sharp.   Bring a copy of your living will and/or healthcare power of attorney to your next office visit.   Jessica Mullins , Thank you for taking time to come for your Medicare Wellness Visit. I appreciate your ongoing commitment to your health goals. Please review the following plan we discussed and let me know if I can assist you in the future.   These are the goals we discussed: Goals    . Maintain healthy lifestyle       This is a list of the screening recommended for you and due dates:  Health Maintenance  Topic Date Due  . Flu Shot  01/25/2018  . Mammogram  05/15/2018  . Tetanus Vaccine  06/27/2021  . DEXA scan (bone density measurement)  Completed  . Pneumonia vaccines  Completed    Health Maintenance for Postmenopausal Women Menopause is a normal process in which your reproductive ability comes to an end. This process happens gradually over a span of months to years, usually between the ages of 2 and 24. Menopause is complete when you have missed 12 consecutive menstrual periods. It is important to talk with your health care provider about some of the most common conditions that affect postmenopausal women, such as heart disease, cancer, and bone loss (osteoporosis). Adopting a healthy lifestyle and getting preventive care can help to promote your health and wellness. Those actions can also lower your chances of developing some of these common conditions. What should I know about menopause? During menopause, you may experience a number of symptoms, such as:  Moderate-to-severe  hot flashes.  Night sweats.  Decrease in sex drive.  Mood swings.  Headaches.  Tiredness.  Irritability.  Memory problems.  Insomnia.  Choosing to treat or not to treat menopausal changes is an individual decision that you make with your health care provider. What should I know about hormone replacement therapy and supplements? Hormone therapy products are effective for treating symptoms that are associated with menopause, such as hot flashes and night sweats. Hormone replacement carries certain risks, especially as you become older. If you are thinking about using estrogen or estrogen with progestin treatments, discuss the benefits and risks with your health care provider. What should I know about heart disease and stroke? Heart disease, heart attack, and stroke become more likely as you age. This may be due, in part, to the hormonal changes that your body experiences during menopause. These can affect how your body processes dietary fats, triglycerides, and cholesterol. Heart attack and stroke are both medical emergencies. There are many things that you can do to help prevent heart disease and stroke:  Have your blood pressure checked at least every 1-2 years. High blood pressure causes heart disease and increases the risk of stroke.  If you are 24-73 years old, ask your health care provider if you should take aspirin to prevent a heart attack or a stroke.  Do not use any tobacco products, including cigarettes, chewing tobacco, or electronic cigarettes. If you need help quitting, ask your health care provider.  It is important to eat a healthy diet and maintain a healthy weight. ?  Be sure to include plenty of vegetables, fruits, low-fat dairy products, and lean protein. ? Avoid eating foods that are high in solid fats, added sugars, or salt (sodium).  Get regular exercise. This is one of the most important things that you can do for your health. ? Try to exercise for at least  150 minutes each week. The type of exercise that you do should increase your heart rate and make you sweat. This is known as moderate-intensity exercise. ? Try to do strengthening exercises at least twice each week. Do these in addition to the moderate-intensity exercise.  Know your numbers.Ask your health care provider to check your cholesterol and your blood glucose. Continue to have your blood tested as directed by your health care provider.  What should I know about cancer screening? There are several types of cancer. Take the following steps to reduce your risk and to catch any cancer development as early as possible. Breast Cancer  Practice breast self-awareness. ? This means understanding how your breasts normally appear and feel. ? It also means doing regular breast self-exams. Let your health care provider know about any changes, no matter how small.  If you are 87 or older, have a clinician do a breast exam (clinical breast exam or CBE) every year. Depending on your age, family history, and medical history, it may be recommended that you also have a yearly breast X-ray (mammogram).  If you have a family history of breast cancer, talk with your health care provider about genetic screening.  If you are at high risk for breast cancer, talk with your health care provider about having an MRI and a mammogram every year.  Breast cancer (BRCA) gene test is recommended for women who have family members with BRCA-related cancers. Results of the assessment will determine the need for genetic counseling and BRCA1 and for BRCA2 testing. BRCA-related cancers include these types: ? Breast. This occurs in males or females. ? Ovarian. ? Tubal. This may also be called fallopian tube cancer. ? Cancer of the abdominal or pelvic lining (peritoneal cancer). ? Prostate. ? Pancreatic.  Cervical, Uterine, and Ovarian Cancer Your health care provider may recommend that you be screened regularly for  cancer of the pelvic organs. These include your ovaries, uterus, and vagina. This screening involves a pelvic exam, which includes checking for microscopic changes to the surface of your cervix (Pap test).  For women ages 21-65, health care providers may recommend a pelvic exam and a Pap test every three years. For women ages 16-65, they may recommend the Pap test and pelvic exam, combined with testing for human papilloma virus (HPV), every five years. Some types of HPV increase your risk of cervical cancer. Testing for HPV may also be done on women of any age who have unclear Pap test results.  Other health care providers may not recommend any screening for nonpregnant women who are considered low risk for pelvic cancer and have no symptoms. Ask your health care provider if a screening pelvic exam is right for you.  If you have had past treatment for cervical cancer or a condition that could lead to cancer, you need Pap tests and screening for cancer for at least 20 years after your treatment. If Pap tests have been discontinued for you, your risk factors (such as having a new sexual partner) need to be reassessed to determine if you should start having screenings again. Some women have medical problems that increase the chance of getting cervical cancer.  In these cases, your health care provider may recommend that you have screening and Pap tests more often.  If you have a family history of uterine cancer or ovarian cancer, talk with your health care provider about genetic screening.  If you have vaginal bleeding after reaching menopause, tell your health care provider.  There are currently no reliable tests available to screen for ovarian cancer.  Lung Cancer Lung cancer screening is recommended for adults 105-5 years old who are at high risk for lung cancer because of a history of smoking. A yearly low-dose CT scan of the lungs is recommended if you:  Currently smoke.  Have a history of at  least 30 pack-years of smoking and you currently smoke or have quit within the past 15 years. A pack-year is smoking an average of one pack of cigarettes per day for one year.  Yearly screening should:  Continue until it has been 15 years since you quit.  Stop if you develop a health problem that would prevent you from having lung cancer treatment.  Colorectal Cancer  This type of cancer can be detected and can often be prevented.  Routine colorectal cancer screening usually begins at age 55 and continues through age 84.  If you have risk factors for colon cancer, your health care provider may recommend that you be screened at an earlier age.  If you have a family history of colorectal cancer, talk with your health care provider about genetic screening.  Your health care provider may also recommend using home test kits to check for hidden blood in your stool.  A small camera at the end of a tube can be used to examine your colon directly (sigmoidoscopy or colonoscopy). This is done to check for the earliest forms of colorectal cancer.  Direct examination of the colon should be repeated every 5-10 years until age 12. However, if early forms of precancerous polyps or small growths are found or if you have a family history or genetic risk for colorectal cancer, you may need to be screened more often.  Skin Cancer  Check your skin from head to toe regularly.  Monitor any moles. Be sure to tell your health care provider: ? About any new moles or changes in moles, especially if there is a change in a mole's shape or color. ? If you have a mole that is larger than the size of a pencil eraser.  If any of your family members has a history of skin cancer, especially at a young age, talk with your health care provider about genetic screening.  Always use sunscreen. Apply sunscreen liberally and repeatedly throughout the day.  Whenever you are outside, protect yourself by wearing long  sleeves, pants, a wide-brimmed hat, and sunglasses.  What should I know about osteoporosis? Osteoporosis is a condition in which bone destruction happens more quickly than new bone creation. After menopause, you may be at an increased risk for osteoporosis. To help prevent osteoporosis or the bone fractures that can happen because of osteoporosis, the following is recommended:  If you are 39-38 years old, get at least 1,000 mg of calcium and at least 600 mg of vitamin D per day.  If you are older than age 66 but younger than age 43, get at least 1,200 mg of calcium and at least 600 mg of vitamin D per day.  If you are older than age 31, get at least 1,200 mg of calcium and at least 800 mg of vitamin  D per day.  Smoking and excessive alcohol intake increase the risk of osteoporosis. Eat foods that are rich in calcium and vitamin D, and do weight-bearing exercises several times each week as directed by your health care provider. What should I know about how menopause affects my mental health? Depression may occur at any age, but it is more common as you become older. Common symptoms of depression include:  Low or sad mood.  Changes in sleep patterns.  Changes in appetite or eating patterns.  Feeling an overall lack of motivation or enjoyment of activities that you previously enjoyed.  Frequent crying spells.  Talk with your health care provider if you think that you are experiencing depression. What should I know about immunizations? It is important that you get and maintain your immunizations. These include:  Tetanus, diphtheria, and pertussis (Tdap) booster vaccine.  Influenza every year before the flu season begins.  Pneumonia vaccine.  Shingles vaccine.  Your health care provider may also recommend other immunizations. This information is not intended to replace advice given to you by your health care provider. Make sure you discuss any questions you have with your health care  provider. Document Released: 08/05/2005 Document Revised: 01/01/2016 Document Reviewed: 03/17/2015 Elsevier Interactive Patient Education  2018 [employer].

## 2017-11-14 ENCOUNTER — Other Ambulatory Visit: Payer: Self-pay | Admitting: Family Medicine

## 2017-11-16 ENCOUNTER — Encounter: Payer: Self-pay | Admitting: Family Medicine

## 2017-11-16 ENCOUNTER — Encounter: Payer: Medicare PPO | Admitting: Family Medicine

## 2017-11-16 ENCOUNTER — Ambulatory Visit (INDEPENDENT_AMBULATORY_CARE_PROVIDER_SITE_OTHER): Payer: Medicare PPO | Admitting: Family Medicine

## 2017-11-16 VITALS — BP 122/62 | HR 66 | Temp 98.1°F | Resp 16 | Ht 61.0 in | Wt 160.6 lb

## 2017-11-16 DIAGNOSIS — I10 Essential (primary) hypertension: Secondary | ICD-10-CM

## 2017-11-16 DIAGNOSIS — Z23 Encounter for immunization: Secondary | ICD-10-CM | POA: Diagnosis not present

## 2017-11-16 DIAGNOSIS — R42 Dizziness and giddiness: Secondary | ICD-10-CM

## 2017-11-16 DIAGNOSIS — R350 Frequency of micturition: Secondary | ICD-10-CM | POA: Diagnosis not present

## 2017-11-16 DIAGNOSIS — Z0000 Encounter for general adult medical examination without abnormal findings: Secondary | ICD-10-CM | POA: Diagnosis not present

## 2017-11-16 DIAGNOSIS — E785 Hyperlipidemia, unspecified: Secondary | ICD-10-CM | POA: Diagnosis not present

## 2017-11-16 DIAGNOSIS — R296 Repeated falls: Secondary | ICD-10-CM | POA: Diagnosis not present

## 2017-11-16 LAB — POC URINALSYSI DIPSTICK (AUTOMATED)
Bilirubin, UA: NEGATIVE
Blood, UA: NEGATIVE
Glucose, UA: NEGATIVE
Ketones, UA: NEGATIVE
Nitrite, UA: POSITIVE
Protein, UA: POSITIVE — AB
Spec Grav, UA: 1.01 (ref 1.010–1.025)
Urobilinogen, UA: 1 E.U./dL
pH, UA: 6.5 (ref 5.0–8.0)

## 2017-11-16 NOTE — Progress Notes (Signed)
Subjective:     Jessica Mullins is a 84 y.o. female and is here for a comprehensive physical exam. The patient reports no problems.  Social History   Socioeconomic History  . Marital status: Widowed    Spouse name: Not on file  . Number of children: Not on file  . Years of education: Not on file  . Highest education level: Not on file  Occupational History  . 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 Financial resource strain: Not on file  . Food insecurity:    Worry: Not on file    Inability: Not on file  . Transportation needs:    Medical: Not on file    Non-medical: Not on file  Tobacco Use  . Smoking status: Never Smoker  . Smokeless tobacco: Never Used  Substance and Sexual Activity  . Alcohol use: No  . Drug use: No  . Sexual activity: Never  Lifestyle  . Physical activity:    Days per week: Not on file    Minutes per session: Not on file  . Stress: Not on file  Relationships  . Social connections:    Talks on phone: Not on file    Gets together: Not on file    Attends religious service: Not on file    Active member of club or organization: Not on file    Attends meetings of clubs or organizations: Not on file    Relationship status: Not on file  . Intimate partner violence:    Fear of current or ex partner: Not on file    Emotionally abused: Not on file    Physically abused: Not on file    Forced sexual activity: Not on file  Other Topics Concern  . Not on file  Social History Narrative  . Not on file   Health Maintenance  Topic Date Due  . INFLUENZA VACCINE  01/25/2018  . MAMMOGRAM  05/15/2018  . TETANUS/TDAP  06/27/2021  . DEXA SCAN  Completed  . PNA vac Low Risk Adult  Completed    The following portions of the patient's history were reviewed and updated as appropriate:  She  has a past medical history of Allergy, Ankle fracture, left, Breast cancer (HCC) (1115/16), Bursitis of right shoulder, Cataract,  Family history of cancer, Fracture of right wrist, GERD (gastroesophageal reflux disease), Hyperlipidemia, Hypertension, Skin cancer (2000), and Vaso vagal episode. She does not have any pertinent problems on file. She  has a past surgical history that includes Appendectomy; Ganglion cyst excision; Breast lumpectomy; Lipiflow procedure; Cataract extraction (2015); Abdominal hysterectomy (1988); fiberadenoma (Bilateral, 1978, 1980); Colonoscopy (2010); DG  BONE DENSITY ([place] HX); Eye surgery (Bilateral); and Breast lumpectomy with radioactive seed and sentinel lymph node biopsy (Left, 06/11/2015). Her family history includes Cancer in her mother; Coronary artery disease in her brother; Diabetes in her brother and maternal aunt; Heart disease in her brother; Heart disease (age of onset: 9) in her father; Hyperlipidemia in her brother; Hypertension in her brother; Lung cancer in her unknown relative; Lung cancer (age of onset: 37) in her sister; Thrombosis in her unknown relative. She  reports that she has never smoked. She has never used smokeless tobacco. She reports that she does not drink alcohol or use drugs. She has a current medication list which includes the following prescription(s): alendronate, biotin, calcium carbonate-vitamin d, vitamin d3, cyclosporine, diltiazem, eliquis, flecainide, ketoconazole, loratadine, omega-3 fatty acids, pravastatin, propylene glycol,  psyllium, and turmeric, and the following Facility-Administered Medications: sodium chloride. Current Outpatient Medications on File Prior to Visit  Medication Sig Dispense Refill  . alendronate (FOSAMAX) 70 MG tablet TAKE 1 TABLET (70 MG TOTAL) BY MOUTH EVERY SEVEN DAYS. TAKE WITH A FULL GLASS OF WATER AT NOON TIME ON EMPTY STOMACH. 4 tablet 0  . Biotin (BIOTIN 5000) 5 MG CAPS Take 5 mg by mouth daily.     . Calcium Carbonate-Vitamin D (CALTRATE 600+D) 600-400 MG-UNIT per tablet Take 1 tablet by mouth daily.     . Cholecalciferol  (VITAMIN D3) 1000 UNITS CAPS Take 1 capsule by mouth daily.      . cycloSPORINE (RESTASIS) 0.05 % ophthalmic emulsion PLACE 1 DROP INTO BOTH EYES TWO (TWO) TIMES DAILY.    [name] diltiazem (CARDIZEM CD) 180 MG 24 hr capsule TAKE 1 CAPSULE (180 MG TOTAL) BY MOUTH DAILY. 30 capsule 12  . ELIQUIS 5 MG TABS tablet TAKE 1 TABLET (5 MG TOTAL) BY MOUTH TWO TIMES DAILY. 60 tablet 3  . flecainide (TAMBOCOR) 50 MG tablet TAKE 1 TABLET (50 MG TOTAL) BY MOUTH TWO TIMES DAILY. 60 tablet 3  . ketoconazole (NIZORAL) 2 % cream Apply 1 application topically daily as needed for irritation.     [name] loratadine (CLARITIN) 10 MG tablet Take 10 mg by mouth daily.    . Omega-3 Fatty Acids (FISH OIL PO) Take 1 tablet by mouth daily.     . pravastatin (PRAVACHOL) 40 MG tablet TAKE 1 TABLET (40 MG TOTAL) BY MOUTH DAILY. REPEAT LABS ARE DUE NOW 90 tablet 2  . Propylene Glycol 0.6 % SOLN Apply 1 drop to eye daily as needed (dry eyes).     . Psyllium (METAMUCIL PO) Take 15 mLs by mouth daily.     . Turmeric 500 MG CAPS Take 1 capsule by mouth daily.      Current Facility-Administered Medications on File Prior to Visit  Medication Dose Route Frequency Provider Last Rate Last Dose  . sodium chloride 0.9 % injection 10 mL  10 mL Intracatheter PRN Nicholas Lose, MD   10 mL at 09/21/15 1717  Review of Systems  Constitutional: Negative for chills, fever and malaise/fatigue.  HENT: Negative for congestion and hearing loss.   Eyes: Negative for blurred vision and discharge.  Respiratory: Negative for cough, sputum production and shortness of breath.   Cardiovascular: Negative for chest pain, palpitations and leg swelling.  Gastrointestinal: Negative for abdominal pain, blood in stool, constipation, diarrhea, heartburn, nausea and vomiting.  Genitourinary: Negative for dysuria, frequency, hematuria and urgency.  Musculoskeletal: Negative for back pain, falls and myalgias.  Skin: Negative for rash.  Neurological: Negative for dizziness,  sensory change, loss of consciousness, weakness and headaches.  Endo/Heme/Allergies: Negative for environmental allergies. Does not bruise/bleed easily.  Psychiatric/Behavioral: Negative for depression and suicidal ideas. The patient is not nervous/anxious and does not have insomnia.     She is allergic to sulfa antibiotics; tape; glucosamine forte [nutritional supplements]; and latex.   Objective:    BP 122/62 (BP Location: Left Arm, Cuff Size: Normal)   Pulse 66   Temp 98.1 F (36.7 C) (Oral)   Resp 16   Ht 5' 1" (1.549 m)   Wt 160 lb 9.6 oz (72.8 kg)   SpO2 96%   BMI 30.35 kg/m  General appearance: alert, cooperative, appears stated age and no distress Head: Normocephalic, without obvious abnormality, atraumatic Eyes: conjunctivae/corneas clear. PERRL, EOM's intact. Fundi benign. Ears: normal TM's and external ear  canals both ears Nose: Nares normal. Septum midline. Mucosa normal. No drainage or sinus tenderness. Throat: lips, mucosa, and tongue normal; teeth and gums normal Neck: no adenopathy, no carotid bruit, no JVD, supple, symmetrical, trachea midline and thyroid not enlarged, symmetric, no tenderness/mass/nodules Back: symmetric, no curvature. ROM normal. No CVA tenderness. Lungs: clear to auscultation bilaterally Breasts: normal appearance, no masses or tenderness Heart: regular rate and rhythm, S1, S2 normal, no murmur, click, rub or gallop Abdomen: soft, non-tender; bowel sounds normal; no masses,  no organomegaly Pelvic: not indicated; post-menopausal, no abnormal Pap smears in past Extremities: extremities normal, atraumatic, no cyanosis or edema Pulses: 2+ and symmetric Skin: Skin color, texture, turgor normal. No rashes or lesions Lymph nodes: Cervical, supraclavicular, and axillary nodes normal.    Assessment:    Healthy female exam.      Plan:    ghm utd Check labs See After Visit Summary for Counseling Recommendations    1. Frequent urination  -  POCT Urinalysis Dipstick (Automated) - ciprofloxacin (CIPRO) 250 MG tablet; Take 1 tablet (250 mg total) by mouth 2 (two) times daily.  Dispense: 6 tablet; Refill: 0  2. Essential hypertension  - CBC with Differential/Platelet - Comprehensive metabolic panel  3. Hyperlipidemia LDL goal <100 Tolerating statin, encouraged heart healthy diet, avoid trans fats, minimize simple carbs and saturated fats. Increase exercise as tolerated - CBC with Differential/Platelet - Comprehensive metabolic panel - Lipid panel  4. Falls frequently  - Ambulatory referral to Neurology  5. Dizzy   - Ambulatory referral to Neurology  6. Need for MMR vaccine   - Measles/Mumps/Rubella Immunity

## 2017-11-16 NOTE — Patient Instructions (Signed)
Preventive Care 65 Years and Older, Female Preventive care refers to lifestyle choices and visits with your health care provider that can promote health and wellness. What does preventive care include?  A yearly physical exam. This is also called an annual well check.  Dental exams once or twice a year.  Routine eye exams. Ask your health care provider how often you should have your eyes checked.  Personal lifestyle choices, including: ? Daily care of your teeth and gums. ? Regular physical activity. ? Eating a healthy diet. ? Avoiding tobacco and drug use. ? Limiting alcohol use. ? Practicing safe sex. ? Taking low-dose aspirin every day. ? Taking vitamin and mineral supplements as recommended by your health care provider. What happens during an annual well check? The services and screenings done by your health care provider during your annual well check will depend on your age, overall health, lifestyle risk factors, and family history of disease. Counseling Your health care provider may ask you questions about your:  Alcohol use.  Tobacco use.  Drug use.  Emotional well-being.  Home and relationship well-being.  Sexual activity.  Eating habits.  History of falls.  Memory and ability to understand (cognition).  Work and work environment.  Reproductive health.  Screening You may have the following tests or measurements:  Height, weight, and BMI.  Blood pressure.  Lipid and cholesterol levels. These may be checked every 5 years, or more frequently if you are over 50 years old.  Skin check.  Lung cancer screening. You may have this screening every year starting at age 55 if you have a 30-pack-year history of smoking and currently smoke or have quit within the past 15 years.  Fecal occult blood test (FOBT) of the stool. You may have this test every year starting at age 50.  Flexible sigmoidoscopy or colonoscopy. You may have a sigmoidoscopy every 5 years or  a colonoscopy every 10 years starting at age 50.  Hepatitis C blood test.  Hepatitis B blood test.  Sexually transmitted disease (STD) testing.  Diabetes screening. This is done by checking your blood sugar (glucose) after you have not eaten for a while (fasting). You may have this done every 1-3 years.  Bone density scan. This is done to screen for osteoporosis. You may have this done starting at age 82.  Mammogram. This may be done every 1-2 years. Talk to your health care provider about how often you should have regular mammograms.  Talk with your health care provider about your test results, treatment options, and if necessary, the need for more tests. Vaccines Your health care provider may recommend certain vaccines, such as:  Influenza vaccine. This is recommended every year.  Tetanus, diphtheria, and acellular pertussis (Tdap, Td) vaccine. You may need a Td booster every 10 years.  Varicella vaccine. You may need this if you have not been vaccinated.  Zoster vaccine. You may need this after age 60.  Measles, mumps, and rubella (MMR) vaccine. You may need at least one dose of MMR if you were born in 1957 or later. You may also need a second dose.  Pneumococcal 13-valent conjugate (PCV13) vaccine. One dose is recommended after age 82.  Pneumococcal polysaccharide (PPSV23) vaccine. One dose is recommended after age 82.  Meningococcal vaccine. You may need this if you have certain conditions.  Hepatitis A vaccine. You may need this if you have certain conditions or if you travel or work in places where you may be exposed to hepatitis   A.  Hepatitis B vaccine. You may need this if you have certain conditions or if you travel or work in places where you may be exposed to hepatitis B.  Haemophilus influenzae type b (Hib) vaccine. You may need this if you have certain conditions.  Talk to your health care provider about which screenings and vaccines you need and how often you  need them. This information is not intended to replace advice given to you by your health care provider. Make sure you discuss any questions you have with your health care provider. Document Released: 07/10/2015 Document Revised: 03/02/2016 Document Reviewed: 04/14/2015 Elsevier Interactive Patient Education  2018 Elsevier Inc.  

## 2017-11-17 DIAGNOSIS — Z23 Encounter for immunization: Secondary | ICD-10-CM | POA: Diagnosis not present

## 2017-11-17 LAB — COMPREHENSIVE METABOLIC PANEL
ALT: 17 U/L (ref 0–35)
AST: 20 U/L (ref 0–37)
Albumin: 4.3 g/dL (ref 3.5–5.2)
Alkaline Phosphatase: 52 U/L (ref 39–117)
BUN: 13 mg/dL (ref 6–23)
CO2: 29 mEq/L (ref 19–32)
Calcium: 10 mg/dL (ref 8.4–10.5)
Chloride: 102 mEq/L (ref 96–112)
Creatinine, Ser: 0.82 mg/dL (ref 0.40–1.20)
GFR: 70.43 mL/min (ref >60.00)
Glucose, Bld: 92 mg/dL (ref 70–99)
Potassium: 3.9 mEq/L (ref 3.5–5.1)
Sodium: 139 mEq/L (ref 135–145)
Total Bilirubin: 0.6 mg/dL (ref 0.2–1.2)
Total Protein: 6.3 g/dL (ref 6.0–8.3)

## 2017-11-17 LAB — LIPID PANEL
Cholesterol: 141 mg/dL (ref 0–200)
HDL: 65.4 mg/dL (ref >39.00)
LDL Cholesterol: 59 mg/dL (ref 0–99)
NonHDL: 75.45
Total CHOL/HDL Ratio: 2
Triglycerides: 84 mg/dL (ref 0.0–149.0)
VLDL: 16.8 mg/dL (ref 0.0–40.0)

## 2017-11-17 LAB — CBC WITH DIFFERENTIAL/PLATELET
Basophils Absolute: 0.1 10*3/uL (ref 0.0–0.1)
Basophils Relative: 0.6 % (ref 0.0–3.0)
Eosinophils Absolute: 0.2 10*3/uL (ref 0.0–0.7)
Eosinophils Relative: 2.6 % (ref 0.0–5.0)
HCT: 43.9 % (ref 36.0–46.0)
Hemoglobin: 14.9 g/dL (ref 12.0–15.0)
Lymphocytes Relative: 21.2 % (ref 12.0–46.0)
Lymphs Abs: 1.7 10*3/uL (ref 0.7–4.0)
MCHC: 34 g/dL (ref 30.0–36.0)
MCV: 93.9 fl (ref 78.0–100.0)
Monocytes Absolute: 0.5 10*3/uL (ref 0.1–1.0)
Monocytes Relative: 6.7 % (ref 3.0–12.0)
Neutro Abs: 5.5 10*3/uL (ref 1.4–7.7)
Neutrophils Relative %: 68.9 % (ref 43.0–77.0)
Platelets: 250 10*3/uL (ref 150.0–400.0)
RBC: 4.68 Mil/uL (ref 3.87–5.11)
RDW: 14.1 % (ref 11.5–15.5)
WBC: 8 10*3/uL (ref 4.0–10.5)

## 2017-11-17 MED ORDER — CIPROFLOXACIN HCL 250 MG PO TABS: 250.0000 mg | ORAL_TABLET | Freq: Two times a day (BID) | ORAL | 0 refills | Status: DC

## 2017-11-21 LAB — MEASLES/MUMPS/RUBELLA IMMUNITY
Mumps IgG: 243 AU/mL
Rubella: 1.6 index
Rubeola IgG: >300 AU/mL

## 2017-11-22 DIAGNOSIS — Z08 Encounter for follow-up examination after completed treatment for malignant neoplasm: Secondary | ICD-10-CM | POA: Diagnosis not present

## 2017-11-22 DIAGNOSIS — Z1283 Encounter for screening for malignant neoplasm of skin: Secondary | ICD-10-CM | POA: Diagnosis not present

## 2017-11-22 DIAGNOSIS — Z85820 Personal history of malignant melanoma of skin: Secondary | ICD-10-CM | POA: Diagnosis not present

## 2017-11-23 ENCOUNTER — Encounter: Payer: Self-pay | Admitting: Neurology

## 2017-12-06 ENCOUNTER — Encounter: Payer: Self-pay | Admitting: *Deleted

## 2017-12-12 ENCOUNTER — Encounter: Payer: Self-pay | Admitting: Family Medicine

## 2017-12-12 ENCOUNTER — Ambulatory Visit (INDEPENDENT_AMBULATORY_CARE_PROVIDER_SITE_OTHER): Payer: Medicare PPO | Admitting: Family Medicine

## 2017-12-12 VITALS — BP 153/81 | HR 67 | Temp 97.8°F | Resp 16 | Wt 161.2 lb

## 2017-12-12 DIAGNOSIS — R350 Frequency of micturition: Secondary | ICD-10-CM | POA: Diagnosis not present

## 2017-12-12 DIAGNOSIS — R300 Dysuria: Secondary | ICD-10-CM

## 2017-12-12 DIAGNOSIS — K5901 Slow transit constipation: Secondary | ICD-10-CM

## 2017-12-12 LAB — POC URINALSYSI DIPSTICK (AUTOMATED)
Bilirubin, UA: NEGATIVE
Glucose, UA: NEGATIVE
Ketones, UA: NEGATIVE
Nitrite, UA: NEGATIVE
Protein, UA: POSITIVE — AB
Spec Grav, UA: 1.01 (ref 1.010–1.025)
Urobilinogen, UA: 0.2 E.U./dL
pH, UA: 6 (ref 5.0–8.0)

## 2017-12-12 NOTE — Progress Notes (Signed)
Subjective:  I acted as a Education administrator for [employer]. Yancey Flemings, [place]   Patient ID: Jessica Mullins, female    DOB: [birth year], 84 y.o.   MRN: [id number]  Chief Complaint  Patient presents with  . Constipation  . Urinary Tract Infection    HPI  Patient is in today for constipation. Pt has been using prune juice , mom, colace and occasional miralax.   The mom worked the best but she was in the bathroom all night Patient Care Team: Carollee Herter, Alferd Apa, DO as PCP - General Nicholas Lose, MD as Consulting Physician (Hematology and Oncology) Allyn Kenner, MD as Consulting Physician (Dermatology) Monna Fam, MD as Consulting Physician (Ophthalmology)   Past Medical History:  Diagnosis Date  . Allergy   . Ankle fracture, left   . Breast cancer ([place]) 1115/16   left   . Bursitis of right shoulder   . Cataract   . Family history of cancer   . Fracture of right wrist   . GERD (gastroesophageal reflux disease)   . Hyperlipidemia   . Hypertension   . Skin cancer 2000   melanoma and basal cell  . Vaso vagal episode    during preparation for colonoscopy    Past Surgical History:  Procedure Laterality Date  . ABDOMINAL HYSTERECTOMY  1988   TAH/BSO--FIBROIDS  . APPENDECTOMY    . BREAST LUMPECTOMY     B/L--[facility]  . BREAST LUMPECTOMY WITH RADIOACTIVE SEED AND SENTINEL LYMPH NODE BIOPSY Left 06/11/2015   Procedure: LEFT BREAST LUMPECTOMY WITH RADIOACTIVE SEED AND LEFT SENTINEL LYMPH NODE MAPPING;  Surgeon: Erroll Luna, MD;  Location: [facility];  Service: General;  Laterality: Left;  . CATARACT EXTRACTION  2015  . COLONOSCOPY  2010  . DG  BONE DENSITY ([facility])    . EYE SURGERY Bilateral    cataracts  . fiberadenoma Bilateral 1978, 1980  . GANGLION CYST EXCISION     L  hand  . Lipiflow procedure      Family History  Problem Relation Age of Onset  . Cancer Mother        mets to bone--? primary  . Coronary artery disease Brother        died of M! @ 65  . Diabetes Brother     . Hyperlipidemia Brother   . Hypertension Brother   . Heart disease Brother        chf  . Heart disease Father 23       MI  . Lung cancer Sister 91       former smoker  . Diabetes Maternal Aunt   . Lung cancer Unknown   . Thrombosis Unknown        thromboembolism clotting disorder--? father died of ? clot     Social History   Socioeconomic History  . Marital status: Widowed    Spouse name: Not on file  . Number of children: Not on file  . Years of education: Not on file  . Highest education level: Not on file  Occupational History  . 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 Financial resource strain: Not on file  . Food insecurity:    Worry: Not on file    Inability: Not on file  . Transportation needs:    Medical: Not on file    Non-medical: Not on file  Tobacco Use  . Smoking status: Never Smoker  . Smokeless tobacco: Never Used  Substance and Sexual Activity  . Alcohol use: No  . Drug use: No  . Sexual activity: Never  Lifestyle  . Physical activity:    Days per week: Not on file    Minutes per session: Not on file  . Stress: Not on file  Relationships  . Social connections:    Talks on phone: Not on file    Gets together: Not on file    Attends religious service: Not on file    Active member of club or organization: Not on file    Attends meetings of clubs or organizations: Not on file    Relationship status: Not on file  . Intimate partner violence:    Fear of current or ex partner: Not on file    Emotionally abused: Not on file    Physically abused: Not on file    Forced sexual activity: Not on file  Other Topics Concern  . Not on file  Social History Narrative  . Not on file    Outpatient Medications Prior to Visit  Medication Sig Dispense Refill  . alendronate (FOSAMAX) 70 MG tablet TAKE 1 TABLET (70 MG TOTAL) BY MOUTH EVERY SEVEN DAYS. TAKE WITH A FULL GLASS OF WATER AT NOON TIME ON EMPTY STOMACH. 4  tablet 0  . Biotin (BIOTIN 5000) 5 MG CAPS Take 5 mg by mouth daily.     . Calcium Carbonate-Vitamin D (CALTRATE 600+D) 600-400 MG-UNIT per tablet Take 1 tablet by mouth daily.     . Cholecalciferol (VITAMIN D3) 1000 UNITS CAPS Take 1 capsule by mouth daily.      . ciprofloxacin (CIPRO) 250 MG tablet Take 1 tablet (250 mg total) by mouth 2 (two) times daily. 6 tablet 0  . cycloSPORINE (RESTASIS) 0.05 % ophthalmic emulsion PLACE 1 DROP INTO BOTH EYES TWO (TWO) TIMES DAILY.    [name] diltiazem (CARDIZEM CD) 180 MG 24 hr capsule TAKE 1 CAPSULE (180 MG TOTAL) BY MOUTH DAILY. 30 capsule 12  . ELIQUIS 5 MG TABS tablet TAKE 1 TABLET (5 MG TOTAL) BY MOUTH TWO TIMES DAILY. 60 tablet 3  . flecainide (TAMBOCOR) 50 MG tablet TAKE 1 TABLET (50 MG TOTAL) BY MOUTH TWO TIMES DAILY. 60 tablet 3  . ketoconazole (NIZORAL) 2 % cream Apply 1 application topically daily as needed for irritation.     [name] loratadine (CLARITIN) 10 MG tablet Take 10 mg by mouth daily.    . Omega-3 Fatty Acids (FISH OIL PO) Take 1 tablet by mouth daily.     . pravastatin (PRAVACHOL) 40 MG tablet TAKE 1 TABLET (40 MG TOTAL) BY MOUTH DAILY. REPEAT LABS ARE DUE NOW 90 tablet 2  . Propylene Glycol 0.6 % SOLN Apply 1 drop to eye daily as needed (dry eyes).     . Psyllium (METAMUCIL PO) Take 15 mLs by mouth daily.     . Turmeric 500 MG CAPS Take 1 capsule by mouth daily.      Facility-Administered Medications Prior to Visit  Medication Dose Route Frequency Provider Last Rate Last Dose  . sodium chloride 0.9 % injection 10 mL  10 mL Intracatheter PRN Nicholas Lose, MD   10 mL at 09/21/15 1717    Allergies  Allergen Reactions  . Sulfa Antibiotics Other (See Comments)    As a child, rigid as a stick and not responsive  . Tape Other (See Comments)    Blisters, Please use "paper" tape only for short periods of time  . Glucosamine Forte [Nutritional Supplements]  Rash  . Latex Rash    Review of Systems  Constitutional: Negative for chills, fever  and malaise/fatigue.  HENT: Negative for congestion and hearing loss.   Eyes: Negative for discharge.  Respiratory: Negative for cough, sputum production and shortness of breath.   Cardiovascular: Negative for chest pain, palpitations and leg swelling.  Gastrointestinal: Negative for abdominal pain, blood in stool, constipation, diarrhea, heartburn, nausea and vomiting.  Genitourinary: Negative for dysuria, frequency, hematuria and urgency.  Musculoskeletal: Negative for back pain, falls and myalgias.  Skin: Negative for rash.  Neurological: Negative for dizziness, sensory change, loss of consciousness, weakness and headaches.  Endo/Heme/Allergies: Negative for environmental allergies. Does not bruise/bleed easily.  Psychiatric/Behavioral: Negative for depression and suicidal ideas. The patient is not nervous/anxious and does not have insomnia.        Objective:    Physical Exam  Constitutional: She is oriented to person, place, and time. She appears well-developed and well-nourished.  HENT:  Head: Normocephalic and atraumatic.  Eyes: Conjunctivae and EOM are normal.  Neck: Normal range of motion. Neck supple. No JVD present. Carotid bruit is not present. No thyromegaly present.  Cardiovascular: Normal rate, regular rhythm and normal heart sounds.  No murmur heard. Pulmonary/Chest: Effort normal and breath sounds normal. No respiratory distress. She has no wheezes. She has no rales. She exhibits no tenderness.  Musculoskeletal: She exhibits no edema.  Neurological: She is alert and oriented to person, place, and time.  Psychiatric: She has a normal mood and affect.  Nursing note and vitals reviewed.   BP (!) 153/81   Pulse 67   Temp 97.8 F (36.6 C) (Oral)   Resp 16   Wt 161 lb 3.2 oz (73.1 kg)   SpO2 100%   BMI 30.46 kg/m  Wt Readings from Last 3 Encounters:  12/12/17 161 lb 3.2 oz (73.1 kg)  11/16/17 160 lb 9.6 oz (72.8 kg)  11/09/17 157 lb (71.2 kg)   BP Readings from  Last 3 Encounters:  12/12/17 (!) 153/81  11/16/17 122/62  11/09/17 140/76     Immunization History  Administered Date(s) Administered  . H1N1 07/01/2008  . Influenza Whole 04/10/2007, 04/01/2009, 03/26/2010, 03/25/2012  . Influenza, High Dose Seasonal PF 04/16/2014, 04/09/2015  . Influenza-Unspecified 06/27/2016, 04/03/2017  . Pneumococcal Conjugate-13 05/13/2014  . Pneumococcal Polysaccharide-23 08/29/2016  . Td 11/05/2003  . Tdap 06/28/2011  . Zoster 02/24/2010  . Zoster Recombinat (Shingrix) 10/08/2016    Health Maintenance  Topic Date Due  . INFLUENZA VACCINE  01/25/2018  . MAMMOGRAM  05/15/2018  . TETANUS/TDAP  06/27/2021  . DEXA SCAN  Completed  . PNA vac Low Risk Adult  Completed    Lab Results  Component Value Date   WBC 8.0 11/16/2017   HGB 14.9 11/16/2017   HCT 43.9 11/16/2017   PLT 250.0 11/16/2017   GLUCOSE 92 11/16/2017   CHOL 141 11/16/2017   TRIG 84.0 11/16/2017   HDL 65.40 11/16/2017   LDLDIRECT 153.4 11/16/2009   LDLCALC 59 11/16/2017   ALT 17 11/16/2017   AST 20 11/16/2017   NA 139 11/16/2017   K 3.9 11/16/2017   CL 102 11/16/2017   CREATININE 0.82 11/16/2017   BUN 13 11/16/2017   CO2 29 11/16/2017   TSH 3.490 11/07/2016   INR 1.20 10/21/2015    Lab Results  Component Value Date   TSH 3.490 11/07/2016   Lab Results  Component Value Date   WBC 8.0 11/16/2017   HGB 14.9 11/16/2017   HCT  43.9 11/16/2017   MCV 93.9 11/16/2017   PLT 250.0 11/16/2017   Lab Results  Component Value Date   NA 139 11/16/2017   K 3.9 11/16/2017   CHLORIDE 104 09/21/2015   CO2 29 11/16/2017   GLUCOSE 92 11/16/2017   BUN 13 11/16/2017   CREATININE 0.82 11/16/2017   BILITOT 0.6 11/16/2017   ALKPHOS 52 11/16/2017   AST 20 11/16/2017   ALT 17 11/16/2017   PROT 6.3 11/16/2017   ALBUMIN 4.3 11/16/2017   CALCIUM 10.0 11/16/2017   ANIONGAP 10 11/07/2016   EGFR 67 (L) 09/21/2015   GFR 70.43 11/16/2017   Lab Results  Component Value Date   CHOL 141  11/16/2017   Lab Results  Component Value Date   HDL 65.40 11/16/2017   Lab Results  Component Value Date   LDLCALC 59 11/16/2017   Lab Results  Component Value Date   TRIG 84.0 11/16/2017   Lab Results  Component Value Date   CHOLHDL 2 11/16/2017   No results found for: HGBA1C       Assessment & Plan:   Problem List Items Addressed This Visit      Unprioritized   Constipation by delayed colonic transit    Drink plenty of water mirlax daily con't prune/ prune juice, colace Consider GI I no improvement       Other Visit Diagnoses    Frequent urination    -  Primary   Relevant Orders   POCT Urinalysis Dipstick (Automated) (Completed)   Urine Culture   Dysuria       Relevant Orders   Urine Culture      I am having Nyaisha L. Wedge maintain her Calcium Carbonate-Vitamin D, Vitamin D3, Omega-3 Fatty Acids (FISH OIL PO), Biotin, Turmeric, Propylene Glycol, Psyllium (METAMUCIL PO), loratadine, cycloSPORINE, ketoconazole, pravastatin, ELIQUIS, flecainide, diltiazem, alendronate, and ciprofloxacin.  No orders of the defined types were placed in this encounter.   CMA served as Education administrator during this visit. History, Physical and Plan performed by medical provider. Documentation and orders reviewed and attested to.  Ann Held, DO

## 2017-12-12 NOTE — Patient Instructions (Signed)

## 2017-12-13 DIAGNOSIS — K5901 Slow transit constipation: Secondary | ICD-10-CM | POA: Insufficient documentation

## 2017-12-13 NOTE — Assessment & Plan Note (Signed)
Drink plenty of water mirlax daily con't prune/ prune juice, colace Consider GI I no improvement

## 2017-12-15 ENCOUNTER — Telehealth: Payer: Self-pay | Admitting: Family Medicine

## 2017-12-15 LAB — URINE CULTURE
MICRO NUMBER:: 90734072
SPECIMEN QUALITY:: ADEQUATE

## 2017-12-15 NOTE — Telephone Encounter (Signed)
Copied from [place] [phone]. Topic: Quick Communication - See Telephone Encounter >> Dec 15, 2017  3:23 PM Vernona Rieger wrote: CRM for notification. See Telephone encounter for: 12/15/17.   Patient is requesting her lab work from 6/18. Call back @ [phone]

## 2017-12-18 MED ORDER — AMOXICILLIN-POT CLAVULANATE 875-125 MG PO TABS: 1.0000 | ORAL_TABLET | Freq: Two times a day (BID) | ORAL | 0 refills | Status: DC

## 2017-12-18 NOTE — Telephone Encounter (Signed)
augmentin 875 bid x 7 days  Recheck 10-14 days

## 2017-12-18 NOTE — Telephone Encounter (Signed)
Patient was last treated with cipro on 11/17/17 and we tested her urine again on 6/18 and it was positive again.  Do you want to started her on another round of antibiotics?

## 2017-12-18 NOTE — Telephone Encounter (Signed)
Patient notified and rx sent in 

## 2017-12-19 ENCOUNTER — Other Ambulatory Visit: Payer: Self-pay | Admitting: Family Medicine

## 2017-12-29 ENCOUNTER — Encounter: Payer: Self-pay | Admitting: *Deleted

## 2018-01-01 ENCOUNTER — Ambulatory Visit: Payer: Medicare PPO | Admitting: Family Medicine

## 2018-01-01 ENCOUNTER — Encounter: Payer: Self-pay | Admitting: Family Medicine

## 2018-01-01 VITALS — BP 132/72 | HR 64 | Temp 98.4°F | Resp 16 | Ht 61.0 in | Wt 161.2 lb

## 2018-01-01 DIAGNOSIS — R194 Change in bowel habit: Secondary | ICD-10-CM

## 2018-01-01 DIAGNOSIS — R198 Other specified symptoms and signs involving the digestive system and abdomen: Secondary | ICD-10-CM | POA: Diagnosis not present

## 2018-01-01 DIAGNOSIS — R350 Frequency of micturition: Secondary | ICD-10-CM

## 2018-01-01 LAB — POC URINALSYSI DIPSTICK (AUTOMATED)
Bilirubin, UA: NEGATIVE
Blood, UA: NEGATIVE
Glucose, UA: NEGATIVE
Ketones, UA: NEGATIVE
Leukocytes, UA: NEGATIVE
Nitrite, UA: NEGATIVE
Protein, UA: NEGATIVE
Spec Grav, UA: 1.01 (ref 1.010–1.025)
Urobilinogen, UA: 0.2 E.U./dL
pH, UA: 6 (ref 5.0–8.0)

## 2018-01-01 NOTE — Patient Instructions (Signed)
Kegel Exercises Kegel exercises help strengthen the muscles that support the rectum, vagina, small intestine, bladder, and uterus. Doing Kegel exercises can help:  Improve bladder and bowel control.  Improve sexual response.  Reduce problems and discomfort during pregnancy.  Kegel exercises involve squeezing your pelvic floor muscles, which are the same muscles you squeeze when you try to stop the flow of urine. The exercises can be done while sitting, standing, or lying down, but it is best to vary your position. Phase 1 exercises 1. Squeeze your pelvic floor muscles tight. You should feel a tight lift in your rectal area. If you are a female, you should also feel a tightness in your vaginal area. Keep your stomach, buttocks, and legs relaxed. 2. Hold the muscles tight for up to 10 seconds. 3. Relax your muscles. Repeat this exercise 50 times a day or as many times as told by your health care provider. Continue to do this exercise for at least 4-6 weeks or for as long as told by your health care provider. This information is not intended to replace advice given to you by your health care provider. Make sure you discuss any questions you have with your health care provider. Document Released: 05/30/2012 Document Revised: 02/06/2016 Document Reviewed: 05/03/2015 Elsevier Interactive Patient Education  2018 [employer].  

## 2018-01-01 NOTE — Progress Notes (Signed)
Patient ID: Jessica Mullins, female   DOB: 1930/10/08, 84 y.o.   MRN: [id number]    Subjective:  I acted as a Education administrator for Dr. Carollee Mullins.  Jessica Mullins, Jessica Mullins   Patient ID: Jessica Mullins, female    DOB: 04-08-1931, 84 y.o.   MRN: [id number]  Chief Complaint  Patient presents with  . follow up UTI    HPI  Patient is in today for follow up UTI.  She is still feeling pressure and is urinating frequently and was c/o constipation--  She took colace and miralax and then started having loose frequent stools .  She stopped the miralax and colace but still feels rectal pressure and has 2-3 BM a day  They are very thin and soft.    Patient Care Team: Jessica Mullins, Alferd Apa, DO as PCP - General Jessica Lose, MD as Consulting Physician (Hematology and Oncology) Jessica Kenner, MD as Consulting Physician (Dermatology) Jessica Fam, MD as Consulting Physician (Ophthalmology)   Past Medical History:  Diagnosis Date  . Allergy   . Ankle fracture, left   . Breast cancer ([place]) 1115/16   left   . Bursitis of right shoulder   . Cataract   . Family history of cancer   . Fracture of right wrist   . GERD (gastroesophageal reflux disease)   . Hyperlipidemia   . Hypertension   . Skin cancer 2000   melanoma and basal cell  . Vaso vagal episode    during preparation for colonoscopy    Past Surgical History:  Procedure Laterality Date  . ABDOMINAL HYSTERECTOMY  1988   TAH/BSO--FIBROIDS  . APPENDECTOMY    . BREAST LUMPECTOMY     B/L--FCS  . BREAST LUMPECTOMY WITH RADIOACTIVE SEED AND SENTINEL LYMPH NODE BIOPSY Left 06/11/2015   Procedure: LEFT BREAST LUMPECTOMY WITH RADIOACTIVE SEED AND LEFT SENTINEL LYMPH NODE MAPPING;  Surgeon: Erroll Luna, MD;  Location: [facility];  Service: General;  Laterality: Left;  . CATARACT EXTRACTION  2015  . COLONOSCOPY  2010  . DG  BONE DENSITY ([place] HX)    . EYE SURGERY Bilateral    cataracts  . fiberadenoma Bilateral 1978, 1980  . GANGLION CYST EXCISION     L   hand  . Lipiflow procedure      Family History  Problem Relation Age of Onset  . Cancer Mother        mets to bone--? primary  . Coronary artery disease Brother        died of M! @ 40  . Diabetes Brother   . Hyperlipidemia Brother   . Hypertension Brother   . Heart disease Brother        chf  . Heart disease Father 37       MI  . Lung cancer Sister 74       former smoker  . Diabetes Maternal Aunt   . Lung cancer Unknown   . Thrombosis Unknown        thromboembolism clotting disorder--? father died of ? clot     Social History   Socioeconomic History  . Marital status: Widowed    Spouse name: Not on file  . Number of children: Not on file  . Years of education: Not on file  . Highest education level: Not on file  Occupational History  . 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 Financial resource strain: Not on file  . Food insecurity:  Worry: Not on file    Inability: Not on file  . Transportation needs:    Medical: Not on file    Non-medical: Not on file  Tobacco Use  . Smoking status: Never Smoker  . Smokeless tobacco: Never Used  Substance and Sexual Activity  . Alcohol use: No  . Drug use: No  . Sexual activity: Never  Lifestyle  . Physical activity:    Days per week: Not on file    Minutes per session: Not on file  . Stress: Not on file  Relationships  . Social connections:    Talks on phone: Not on file    Gets together: Not on file    Attends religious service: Not on file    Active member of club or organization: Not on file    Attends meetings of clubs or organizations: Not on file    Relationship status: Not on file  . Intimate partner violence:    Fear of current or ex partner: Not on file    Emotionally abused: Not on file    Physically abused: Not on file    Forced sexual activity: Not on file  Other Topics Concern  . Not on file  Social History Narrative  . Not on file    Outpatient  Medications Prior to Visit  Medication Sig Dispense Refill  . alendronate (FOSAMAX) 70 MG tablet TAKE ONE TABLET BY MOUTH EVERY SEVEN DAYS. TAKE WITH FULL GLASS OF WATER AT NOON DAILY ON EMPTY STOMACH 4 tablet 0  . Biotin (BIOTIN 5000) 5 MG CAPS Take 5 mg by mouth daily.     . Calcium Carbonate-Vitamin D (CALTRATE 600+D) 600-400 MG-UNIT per tablet Take 1 tablet by mouth daily.     . Cholecalciferol (VITAMIN D3) 1000 UNITS CAPS Take 1 capsule by mouth daily.      . cycloSPORINE (RESTASIS) 0.05 % ophthalmic emulsion PLACE 1 DROP INTO BOTH EYES TWO (TWO) TIMES DAILY.    [name] diltiazem (CARDIZEM CD) 180 MG 24 hr capsule TAKE 1 CAPSULE (180 MG TOTAL) BY MOUTH DAILY. 30 capsule 12  . ELIQUIS 5 MG TABS tablet TAKE 1 TABLET (5 MG TOTAL) BY MOUTH TWO TIMES DAILY. 60 tablet 3  . flecainide (TAMBOCOR) 50 MG tablet TAKE 1 TABLET (50 MG TOTAL) BY MOUTH TWO TIMES DAILY. 60 tablet 3  . ketoconazole (NIZORAL) 2 % cream Apply 1 application topically daily as needed for irritation.     [name] loratadine (CLARITIN) 10 MG tablet Take 10 mg by mouth daily.    . Omega-3 Fatty Acids (FISH OIL PO) Take 1 tablet by mouth daily.     . pravastatin (PRAVACHOL) 40 MG tablet TAKE 1 TABLET (40 MG TOTAL) BY MOUTH DAILY. REPEAT LABS ARE DUE NOW 90 tablet 2  . Propylene Glycol 0.6 % SOLN Apply 1 drop to eye daily as needed (dry eyes).     . Psyllium (METAMUCIL PO) Take 15 mLs by mouth daily.     . Turmeric 500 MG CAPS Take 1 capsule by mouth daily.     [name] amoxicillin-clavulanate (AUGMENTIN) 875-125 MG tablet Take 1 tablet by mouth 2 (two) times daily. For 10 days 20 tablet 0   Facility-Administered Medications Prior to Visit  Medication Dose Route Frequency Provider Last Rate Last Dose  . sodium chloride 0.9 % injection 10 mL  10 mL Intracatheter PRN Jessica Lose, MD   10 mL at 09/21/15 1717    Allergies  Allergen Reactions  . Sulfa Antibiotics Other (  See Comments)    As a child, rigid as a stick and not responsive  . Tape Other  (See Comments)    Blisters, Please use "paper" tape only for short periods of time  . Glucosamine Forte [Nutritional Supplements] Rash  . Latex Rash    Review of Systems  Constitutional: Negative for fever and malaise/fatigue.  HENT: Negative for congestion.   Eyes: Negative for blurred vision.  Respiratory: Negative for cough and shortness of breath.   Cardiovascular: Negative for chest pain, palpitations and leg swelling.  Gastrointestinal: Negative for vomiting.  Musculoskeletal: Negative for back pain.  Skin: Negative for rash.  Neurological: Negative for loss of consciousness and headaches.       Objective:    Physical Exam  Constitutional: She is oriented to person, place, and time. She appears well-developed and well-nourished.  HENT:  Head: Normocephalic and atraumatic.  Eyes: Conjunctivae and EOM are normal.  Neck: Normal range of motion. Neck supple. No JVD present. Carotid bruit is not present. No thyromegaly present.  Cardiovascular: Normal rate, regular rhythm and normal heart sounds.  No murmur heard. Pulmonary/Chest: Effort normal and breath sounds normal. No respiratory distress. She has no wheezes. She has no rales. She exhibits no tenderness.  Genitourinary: Vagina normal and uterus normal. Rectal exam shows no mass and guaiac negative stool. No vaginal discharge found.  Musculoskeletal: She exhibits no edema.  Neurological: She is alert and oriented to person, place, and time.  Psychiatric: She has a normal mood and affect.  Nursing note and vitals reviewed.   BP 132/72 (BP Location: Right Arm, Cuff Size: Normal)   Pulse 64   Temp 98.4 F (36.9 C) (Oral)   Resp 16   Ht 5' 1"  (1.549 m)   Wt 161 lb 3.2 oz (73.1 kg)   SpO2 98%   BMI 30.46 kg/m  Wt Readings from Last 3 Encounters:  01/01/18 161 lb 3.2 oz (73.1 kg)  12/12/17 161 lb 3.2 oz (73.1 kg)  11/16/17 160 lb 9.6 oz (72.8 kg)   BP Readings from Last 3 Encounters:  01/01/18 132/72  12/12/17 (!)  153/81  11/16/17 122/62     Immunization History  Administered Date(s) Administered  . H1N1 07/01/2008  . Influenza Whole 04/10/2007, 04/01/2009, 03/26/2010, 03/25/2012  . Influenza, High Dose Seasonal PF 04/16/2014, 04/09/2015  . Influenza-Unspecified 06/27/2016, 04/03/2017  . Pneumococcal Conjugate-13 05/13/2014  . Pneumococcal Polysaccharide-23 08/29/2016  . Td 11/05/2003  . Tdap 06/28/2011  . Zoster 02/24/2010  . Zoster Recombinat (Shingrix) 10/08/2016, 01/03/2017    Health Maintenance  Topic Date Due  . INFLUENZA VACCINE  01/25/2018  . MAMMOGRAM  05/15/2018  . TETANUS/TDAP  06/27/2021  . DEXA SCAN  Completed  . PNA vac Low Risk Adult  Completed    Lab Results  Component Value Date   WBC 8.0 11/16/2017   HGB 14.9 11/16/2017   HCT 43.9 11/16/2017   PLT 250.0 11/16/2017   GLUCOSE 92 11/16/2017   CHOL 141 11/16/2017   TRIG 84.0 11/16/2017   HDL 65.40 11/16/2017   LDLDIRECT 153.4 11/16/2009   LDLCALC 59 11/16/2017   ALT 17 11/16/2017   AST 20 11/16/2017   NA 139 11/16/2017   K 3.9 11/16/2017   CL 102 11/16/2017   CREATININE 0.82 11/16/2017   BUN 13 11/16/2017   CO2 29 11/16/2017   TSH 3.490 11/07/2016   INR 1.20 10/21/2015    Lab Results  Component Value Date   TSH 3.490 11/07/2016   Lab Results  Component Value Date   WBC 8.0 11/16/2017   HGB 14.9 11/16/2017   HCT 43.9 11/16/2017   MCV 93.9 11/16/2017   PLT 250.0 11/16/2017   Lab Results  Component Value Date   NA 139 11/16/2017   K 3.9 11/16/2017   CHLORIDE 104 09/21/2015   CO2 29 11/16/2017   GLUCOSE 92 11/16/2017   BUN 13 11/16/2017   CREATININE 0.82 11/16/2017   BILITOT 0.6 11/16/2017   ALKPHOS 52 11/16/2017   AST 20 11/16/2017   ALT 17 11/16/2017   PROT 6.3 11/16/2017   ALBUMIN 4.3 11/16/2017   CALCIUM 10.0 11/16/2017   ANIONGAP 10 11/07/2016   EGFR 67 (L) 09/21/2015   GFR 70.43 11/16/2017   Lab Results  Component Value Date   CHOL 141 11/16/2017   Lab Results  Component  Value Date   HDL 65.40 11/16/2017   Lab Results  Component Value Date   LDLCALC 59 11/16/2017   Lab Results  Component Value Date   TRIG 84.0 11/16/2017   Lab Results  Component Value Date   CHOLHDL 2 11/16/2017   No results found for: HGBA1C       Assessment & Plan:   Problem List Items Addressed This Visit    None    Visit Diagnoses    Rectal pressure    -  Primary   Relevant Orders   Ambulatory referral to Gastroenterology   Urinary frequency       Relevant Orders   POCT Urinalysis Dipstick (Automated) (Completed)   Frequent bowel movements       Relevant Orders   Ambulatory referral to Gastroenterology      I have discontinued Ivania L. Bayles's amoxicillin-clavulanate. I am also having her maintain her Calcium Carbonate-Vitamin D, Vitamin D3, Omega-3 Fatty Acids (FISH OIL PO), Biotin, Turmeric, Propylene Glycol, Psyllium (METAMUCIL PO), loratadine, cycloSPORINE, ketoconazole, pravastatin, ELIQUIS, flecainide, diltiazem, and alendronate.  No orders of the defined types were placed in this encounter.   CMA served as Education administrator during this visit. History, Physical and Plan performed by medical provider. Documentation and orders reviewed and attested to.  Ann Held, DO

## 2018-01-08 ENCOUNTER — Other Ambulatory Visit: Payer: Self-pay | Admitting: Family Medicine

## 2018-01-08 ENCOUNTER — Other Ambulatory Visit: Payer: Self-pay | Admitting: Hematology and Oncology

## 2018-01-08 DIAGNOSIS — I4819 Other persistent atrial fibrillation: Secondary | ICD-10-CM

## 2018-01-10 ENCOUNTER — Other Ambulatory Visit (HOSPITAL_COMMUNITY): Payer: Self-pay | Admitting: Internal Medicine

## 2018-01-18 ENCOUNTER — Encounter: Payer: Self-pay | Admitting: Gastroenterology

## 2018-02-01 ENCOUNTER — Encounter: Payer: Self-pay | Admitting: Neurology

## 2018-02-01 ENCOUNTER — Ambulatory Visit: Payer: Medicare PPO | Admitting: Neurology

## 2018-02-01 ENCOUNTER — Other Ambulatory Visit (INDEPENDENT_AMBULATORY_CARE_PROVIDER_SITE_OTHER): Payer: Medicare PPO

## 2018-02-01 VITALS — Ht 60.5 in | Wt 160.0 lb

## 2018-02-01 DIAGNOSIS — R6889 Other general symptoms and signs: Secondary | ICD-10-CM

## 2018-02-01 DIAGNOSIS — R2681 Unsteadiness on feet: Secondary | ICD-10-CM | POA: Diagnosis not present

## 2018-02-01 DIAGNOSIS — G6289 Other specified polyneuropathies: Secondary | ICD-10-CM

## 2018-02-01 LAB — VITAMIN B12: Vitamin B-12: 235 pg/mL (ref 211–911)

## 2018-02-01 NOTE — Patient Instructions (Addendum)
You seem to have neuropathy in the feet which may be the cause of your balance issues.  We will check a nerve study to evaluate for neuropathy. We will check B12 deficiency Further recommendations pending results  Your provider has requested that you have labwork completed today. Please go to [facility] ([id number]) on the second floor of this building before leaving the office today. You do not need to check in. If you are not called within 15 minutes please check with the front desk.

## 2018-02-01 NOTE — Progress Notes (Signed)
NEUROLOGY CONSULTATION NOTE  Jessica Mullins MRN: [id number] DOB: 28-Sep-1930  Referring provider: Dr. Carollee Herter Primary care provider: Dr. Carollee Herter  Reason for consult:  falls  HISTORY OF PRESENT ILLNESS: Jessica Mullins is an 84 year old right-handed female with paroxysmal atrial fibrillation, hypertension, hyperlipidemia, and history of orthostatic hypotension, melanoma and breast cancer who presents for falls.  History supplemented by referring provider's note.  In 2016, she was diagnosed and treated for breast cancer with left lumpectomy and subsequent chemotherapy (Taxotere and Cytoxan) and radiation therapy.  She started experiencing unsteady gait afterwards.  She reports history of dizziness but this is unrelated.  She notes that she always feels a little off-balance.  She needs to hold onto the rail when walking up and down stairs and needs to step off curbs sideways.  However, she may have episodes where her legs suddenly feel like "rubber" and heavy.  She is usually able to sit down and let it pass.  On occasion she has fallen.  She denies back pain or radicular pain in the legs.  She denies numbness in the feet.  She has some muscle aches in the neck and shoulder.  She denies double vision, dysarthria, dysphagia or upper extremity symptoms.  She went to physical therapy last year, which helped for a little bit but symptoms returned.  It never occurs when she exercises every morning.  LABS: 11/16/17:  CBC with WBC 8, HGB 14.9, HCT 43.9, PLT 250; CMP with Na 139, K 3.9, glucose 92, BUN 13, Cr 0.82, t bili 0.6, ALP 52, AST 20, ALT 17. 11/07/16: TSH 3.490.  PAST MEDICAL HISTORY: Past Medical History:  Diagnosis Date  . Allergy   . Ankle fracture, left   . Breast cancer ([place]) 1115/16   left   . Bursitis of right shoulder   . Cataract   . Family history of cancer   . Fracture of right wrist   . GERD (gastroesophageal reflux disease)   . Hyperlipidemia   . Hypertension     . Skin cancer 2000   melanoma and basal cell  . Vaso vagal episode    during preparation for colonoscopy    PAST SURGICAL HISTORY: Past Surgical History:  Procedure Laterality Date  . ABDOMINAL HYSTERECTOMY  1988   TAH/BSO--FIBROIDS  . APPENDECTOMY    . BREAST LUMPECTOMY     B/L--FCS  . BREAST LUMPECTOMY WITH RADIOACTIVE SEED AND SENTINEL LYMPH NODE BIOPSY Left 06/11/2015   Procedure: LEFT BREAST LUMPECTOMY WITH RADIOACTIVE SEED AND LEFT SENTINEL LYMPH NODE MAPPING;  Surgeon: Erroll Luna, MD;  Location: [facility];  Service: General;  Laterality: Left;  . CATARACT EXTRACTION  2015  . COLONOSCOPY  2010  . DG  BONE DENSITY ([place] HX)    . EYE SURGERY Bilateral    cataracts  . fiberadenoma Bilateral 1978, 1980  . GANGLION CYST EXCISION     L  hand  . Lipiflow procedure      MEDICATIONS: Current Outpatient Medications on File Prior to Visit  Medication Sig Dispense Refill  . alendronate (FOSAMAX) 70 MG tablet TAKE ONE TABLET BY MOUTH EVERY SEVEN DAYS. TAKE WITH FULL GLASS OF WATER AT NOON DAILY ON EMPTY STOMACH 4 tablet 6  . Biotin (BIOTIN 5000) 5 MG CAPS Take 5 mg by mouth daily.     . Calcium Carbonate-Vitamin D (CALTRATE 600+D) 600-400 MG-UNIT per tablet Take 1 tablet by mouth daily.     . Cholecalciferol (VITAMIN D3) 1000 UNITS CAPS Take 1  capsule by mouth daily.      . cycloSPORINE (RESTASIS) 0.05 % ophthalmic emulsion PLACE 1 DROP INTO BOTH EYES TWO (TWO) TIMES DAILY.    [name] diltiazem (CARDIZEM CD) 180 MG 24 hr capsule TAKE 1 CAPSULE (180 MG TOTAL) BY MOUTH DAILY. 30 capsule 12  . ELIQUIS 5 MG TABS tablet TAKE 1 TABLET (5 MG TOTAL) BY MOUTH TWO TIMES DAILY. 60 tablet 3  . flecainide (TAMBOCOR) 50 MG tablet TAKE 1 TABLET (50 MG TOTAL) BY MOUTH TWO TIMES DAILY. 60 tablet 3  . ketoconazole (NIZORAL) 2 % cream Apply 1 application topically daily as needed for irritation.     [name] loratadine (CLARITIN) 10 MG tablet Take 10 mg by mouth daily.    . Omega-3 Fatty Acids (FISH OIL PO) Take  1 tablet by mouth daily.     . pravastatin (PRAVACHOL) 40 MG tablet TAKE 1 TABLET (40 MG TOTAL) BY MOUTH DAILY. REPEAT LABS ARE DUE NOW 90 tablet 2  . Propylene Glycol 0.6 % SOLN Apply 1 drop to eye daily as needed (dry eyes).     . Psyllium (METAMUCIL PO) Take 15 mLs by mouth daily.     . Turmeric 500 MG CAPS Take 1 capsule by mouth daily.      Current Facility-Administered Medications on File Prior to Visit  Medication Dose Route Frequency Provider Last Rate Last Dose  . sodium chloride 0.9 % injection 10 mL  10 mL Intracatheter PRN Nicholas Lose, MD   10 mL at 09/21/15 1717    ALLERGIES: Allergies  Allergen Reactions  . Sulfa Antibiotics Other (See Comments)    As a child, rigid as a stick and not responsive  . Tape Other (See Comments)    Blisters, Please use "paper" tape only for short periods of time  . Glucosamine Forte [Nutritional Supplements] Rash  . Latex Rash    FAMILY HISTORY: Family History  Problem Relation Age of Onset  . Cancer Mother        mets to bone--? primary  . Coronary artery disease Brother        died of M! @ 26  . Diabetes Brother   . Hyperlipidemia Brother   . Hypertension Brother   . Heart disease Brother        chf  . Heart disease Father 33       MI  . Lung cancer Sister 66       former smoker  . Diabetes Maternal Aunt   . Lung cancer Unknown   . Thrombosis Unknown        thromboembolism clotting disorder--? father died of ? clot     SOCIAL HISTORY: Social History   Socioeconomic History  . Marital status: Widowed    Spouse name: Not on file  . Number of children: 2  . Years of education: Not on file  . Highest education level: Master's degree (e.g., MA, MS, MEng, MEd, [name], [name])  Occupational History  . 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 Financial resource strain: Not on file  . Food insecurity:    Worry: Not on file    Inability: Not on file  . Transportation needs:    Medical:  Not on file    Non-medical: Not on file  Tobacco Use  . Smoking status: Never Smoker  . Smokeless tobacco: Never Used  Substance and Sexual Activity  . Alcohol use: No  . Drug use: No  .  Sexual activity: Never  Lifestyle  . Physical activity:    Days per week: Not on file    Minutes per session: Not on file  . Stress: Not on file  Relationships  . Social connections:    Talks on phone: Not on file    Gets together: Not on file    Attends religious service: Not on file    Active member of club or organization: Not on file    Attends meetings of clubs or organizations: Not on file    Relationship status: Not on file  . Intimate partner violence:    Fear of current or ex partner: Not on file    Emotionally abused: Not on file    Physically abused: Not on file    Forced sexual activity: Not on file  Other Topics Concern  . Not on file  Social History Narrative   Patient is widowed. She lives alone in a one level home. She drinks decaf coffee. She is right-handed. She works out everyday to a [employer] 30 minute program.    REVIEW OF SYSTEMS: Constitutional: No fevers, chills, or sweats, no generalized fatigue, change in appetite Eyes: No visual changes, double vision, eye pain Ear, nose and throat: No hearing loss, ear pain, nasal congestion, sore throat Cardiovascular: No chest pain, palpitations Respiratory:  No shortness of breath at rest or with exertion, wheezes GastrointestinaI: No nausea, vomiting, diarrhea, abdominal pain, fecal incontinence Genitourinary:  No dysuria, urinary retention or frequency Musculoskeletal:  No neck pain, back pain Integumentary: No rash, pruritus, skin lesions Neurological: as above Psychiatric: No depression, insomnia, anxiety Endocrine: No palpitations, fatigue, diaphoresis, mood swings, change in appetite, change in weight, increased thirst Hematologic/Lymphatic:  No purpura, petechiae. Allergic/Immunologic: no itchy/runny eyes,  nasal congestion, recent allergic reactions, rashes  PHYSICAL EXAM: Vitals:   02/01/18 1017  SpO2: 96%  BP  130/72 Pulse  53  Wt  160 lb Ht  5'0.5" General: No acute distress.  Patient appears well-groomed.  Head:  Normocephalic/atraumatic Eyes:  fundi examined but not visualized Neck: supple, no paraspinal tenderness, full range of motion Back: No paraspinal tenderness Heart: regular rate and rhythm Lungs: Clear to auscultation bilaterally. Vascular: No carotid bruits. Neurological Exam: Mental status: alert and oriented to person, place, and time, recent and remote memory intact, fund of knowledge intact, attention and concentration intact, speech fluent and not dysarthric, language intact. Cranial nerves: CN I: not tested CN II: pupils equal, round and reactive to light, visual fields intact CN III, IV, VI:  full range of motion, no nystagmus, no ptosis CN V: facial sensation intact CN VII: upper and lower face symmetric CN VIII: hearing intact CN IX, X: gag intact, uvula midline CN XI: sternocleidomastoid and trapezius muscles intact CN XII: tongue midline Bulk & Tone: normal, no fasciculations. Motor:  5/5 throughout  Sensation:  Pinprick sensation intact. Vibration sensation reduced in feet. Deep Tendon Reflexes:  2+ throughout except absent in ankles, toes downgoing.  Finger to nose testing:  Without dysmetria.  Heel to shin:  Without dysmetria.  Gait:  Overall steady but does appear to mildly sway at times.  Able to turn but not tandem walk. Romberg negative.  IMPRESSION: Unsteady gait.  Probable underlying peripheral neuropathy as demonstrated by reduced vibratory sensation in the feet.  Idiopathic at this point.  Onset following chemotherapy but unsure if related.  She has no history of diabetes.  She does not exhibit any lateralizing symptoms to suggest intracranial abnormality.  She  does not exhibit symptoms to suggest myelopathy.  She does not exhibit any muscle  weakness.  She does not exhibit symptoms consistent with lumbar stenosis.  She does not exhibit findings consistent with a neurodegenerative disease such as Parkinson's.  I don't have any explanation for the sudden episodes of bilateral leg heaviness as again her strength is intact.  PLAN: We will check NCV-EMG of lower extremities to confirm We will check for B12 deficiency Recommend continued exercises and taking fall precautions. Further recommendations pending results.  Thank you for allowing me to take part in the care of this patient.  Metta Clines, DO  CC:  Roma Schanz, DO

## 2018-02-08 ENCOUNTER — Telehealth: Payer: Self-pay

## 2018-02-08 NOTE — Telephone Encounter (Signed)
-----   Message from Pieter Partridge, DO sent at 02/05/2018  7:17 AM EDT ----- B12 is in the low normal range, which may still present with B12 deficiency symptoms.  Recommend taking oral 1031mcg daily.

## 2018-02-08 NOTE — Telephone Encounter (Signed)
Called and LM on cell VM for Pt to begin OTC 1078mcg, and to call with any questions

## 2018-02-22 ENCOUNTER — Encounter: Payer: Medicare PPO | Admitting: Neurology

## 2018-02-27 ENCOUNTER — Ambulatory Visit (INDEPENDENT_AMBULATORY_CARE_PROVIDER_SITE_OTHER): Payer: Medicare PPO | Admitting: Neurology

## 2018-02-27 DIAGNOSIS — M5417 Radiculopathy, lumbosacral region: Secondary | ICD-10-CM

## 2018-02-27 DIAGNOSIS — R2681 Unsteadiness on feet: Secondary | ICD-10-CM | POA: Diagnosis not present

## 2018-02-27 NOTE — Procedures (Signed)
[facility]  [place], [place]  [place], [place] [address] Tel: [phone] Fax:  [phone] Test Date:  02/27/2018  Patient: Jessica Mullins DOB: [birth year] Physician: Narda Amber, DO  Sex: Female Height: 5\' 1"  Ref Phys: Metta Clines, D.O.  ID#: [id number] Temp: 34.0C Technician:    Patient Complaints: This is an 84 year-old female referred for gait unsteadiness and paresthesia of the feet.  NCV & EMG Findings: Extensive electrodiagnostic testing of the right lower extremity and additional studies of the left shows:  1. Bilateral sural and superficial peroneal sensory responses are within normal limits. 2. Bilateral peroneal and tibial motor responses are within normal limits. 3. Bilateral tibial H reflex studies are within normal limits. 4. Sparse chronic motor axon loss changes are seen affecting the tibialis anterior and rectus femoris muscles, without accompanied active denervation. Proximal and deep muscles were not tested as patient is on anticoagulation therapy.  Impression: 1. Chronic L3-4 radiculopathy affecting bilateral lower extremities, mild in degree electrically. 2. There is no evidence of a large fiber sensorimotor polyneuropathy affecting the lower extremities.   ___________________________ Narda Amber, DO    Nerve Conduction Studies Anti Sensory Summary Table   Stim Site NR Peak (ms) Norm Peak (ms) P-T Amp (V) Norm P-T Amp  Left Sup Peroneal Anti Sensory (Ant Lat Mall)  34C  12 cm    2.2 <4.6 10.9 >3  Right Sup Peroneal Anti Sensory (Ant Lat Mall)  34C  12 cm    2.1 <4.6 13.8 >3  Left Sural Anti Sensory (Lat Mall)  34C  Calf    3.0 <4.6 10.0 >3  Right Sural Anti Sensory (Lat Mall)  34C  Calf    2.6 <4.6 11.3 >3   Motor Summary Table   Stim Site NR Onset (ms) Norm Onset (ms) O-P Amp (mV) Norm O-P Amp Site1 Site2 Delta-0 (ms) Dist (cm) Vel (m/s) Norm Vel (m/s)  Left Peroneal Motor (Ext Dig Brev)  34C  Ankle    3.0 <6.0 2.5  >2.5 B Fib Ankle 6.4 34.0 53 >40  B Fib    9.4  2.5  Poplt B Fib 1.4 8.0 57 >40  Poplt    10.8  2.5         Right Peroneal Motor (Ext Dig Brev)  34C  Ankle    3.4 <6.0 3.3 >2.5 B Fib Ankle 6.8 32.0 47 >40  B Fib    10.2  3.3  Poplt B Fib 1.3 8.0 62 >40  Poplt    11.5  3.0         Left Tibial Motor (Abd Hall Brev)  34C  Ankle    3.6 <6.0 9.0 >4 Knee Ankle 7.1 35.0 49 >40  Knee    10.7  6.6         Right Tibial Motor (Abd Hall Brev)  34C  Ankle    3.2 <6.0 9.5 >4 Knee Ankle 8.2 35.0 43 >40  Knee    11.4  7.7          H Reflex Studies   NR H-Lat (ms) Lat Norm (ms) L-R H-Lat (ms)  Left Tibial (Gastroc)  34C     29.66 <35 0.00  Right Tibial (Gastroc)  34C     29.66 <35 0.00   EMG   Side Muscle Ins Act Fibs Psw Fasc Number Recrt Dur Dur. Amp Amp. Poly Poly. Comment  Left AntTibialis Nml Nml Nml Nml 1- Rapid Few 1+ Few 1+ Nml  Nml N/A  Left Gastroc Nml Nml Nml Nml Nml Nml Nml Nml Nml Nml Nml Nml N/A  Left RectFemoris Nml Nml Nml Nml 1- Rapid Some 1+ Some 1+ Nml Nml N/A  Left BicepsFemS Nml Nml Nml Nml Nml Nml Nml Nml Nml Nml Nml Nml N/A  Right BicepsFemS Nml Nml Nml Nml 1- Rapid Some 1+ Some 1+ Nml Nml N/A  Right AntTibialis Nml Nml Nml Nml Nml Nml Nml Nml Nml Nml Nml Nml N/A  Right Gastroc Nml Nml Nml Nml Nml Nml Nml Nml Nml Nml Nml Nml N/A  Right RectFemoris Nml Nml Nml Nml 1- Rapid Few 1+ Few 1+ Nml Nml N/A      Waveforms:

## 2018-03-06 ENCOUNTER — Telehealth: Payer: Self-pay | Admitting: *Deleted

## 2018-03-06 ENCOUNTER — Other Ambulatory Visit: Payer: Self-pay | Admitting: *Deleted

## 2018-03-06 ENCOUNTER — Encounter: Payer: Self-pay | Admitting: *Deleted

## 2018-03-06 ENCOUNTER — Other Ambulatory Visit: Payer: Self-pay | Admitting: Nurse Practitioner

## 2018-03-06 DIAGNOSIS — R29898 Other symptoms and signs involving the musculoskeletal system: Secondary | ICD-10-CM

## 2018-03-06 DIAGNOSIS — R2681 Unsteadiness on feet: Secondary | ICD-10-CM

## 2018-03-06 DIAGNOSIS — M5417 Radiculopathy, lumbosacral region: Secondary | ICD-10-CM

## 2018-03-06 NOTE — Telephone Encounter (Signed)
-----   Message from Pieter Partridge, DO sent at 02/28/2018  8:10 AM EDT ----- Nerve test did not reveal any evidence of neuropathy.  However, it did show evidence of possible pinched nerve in back.  This may explain the leg weakness/heaviness.  I would like to check MRI of lumbar spine without contrast to evaluate further.

## 2018-03-06 NOTE — Telephone Encounter (Signed)
Attempted to contact patient by phone but no answer and no voicemail.  Sent results via My Chart and MRI order placed.

## 2018-03-08 ENCOUNTER — Other Ambulatory Visit: Payer: Self-pay | Admitting: Nurse Practitioner

## 2018-03-19 ENCOUNTER — Ambulatory Visit: Payer: Medicare PPO | Admitting: Gastroenterology

## 2018-03-19 ENCOUNTER — Encounter: Payer: Self-pay | Admitting: Internal Medicine

## 2018-03-22 ENCOUNTER — Ambulatory Visit
Admission: RE | Admit: 2018-03-22 | Discharge: 2018-03-22 | Disposition: A | Discharge status: Home / Self Care | Payer: Medicare PPO | Source: Ambulatory Visit | Attending: Neurology | Admitting: Neurology

## 2018-03-22 DIAGNOSIS — M48061 Spinal stenosis, lumbar region without neurogenic claudication: Secondary | ICD-10-CM | POA: Diagnosis not present

## 2018-03-22 DIAGNOSIS — R29898 Other symptoms and signs involving the musculoskeletal system: Secondary | ICD-10-CM

## 2018-03-22 DIAGNOSIS — M5417 Radiculopathy, lumbosacral region: Secondary | ICD-10-CM

## 2018-03-22 DIAGNOSIS — R2681 Unsteadiness on feet: Secondary | ICD-10-CM

## 2018-03-26 ENCOUNTER — Encounter: Payer: Self-pay | Admitting: *Deleted

## 2018-03-26 ENCOUNTER — Telehealth: Payer: Self-pay | Admitting: *Deleted

## 2018-03-26 NOTE — Telephone Encounter (Signed)
-----   Message from Chester Holstein, LPN sent at 4/43/6016 12:40 PM EDT -----   ----- Message ----- From: Pieter Partridge, DO Sent: 03/23/2018   2:02 PM EDT To: Clois Comber, CMA  MRI of lumbar spine does show arthritis that may be pressing on nerves.  This may be a cause of numbness, leg heaviness and unsteady gait.  Alternatively, she may still have a neuropathy that just didn't show up on the previous nerve test.  Regardless, treatment would be physical therapy for unsteady gait.  She should make a follow up appointment if she would like to further discuss.

## 2018-03-26 NOTE — Telephone Encounter (Signed)
Results and instructions sent via My Chart.  Referral sent to [facility].

## 2018-03-28 ENCOUNTER — Telehealth: Payer: Self-pay | Admitting: Neurology

## 2018-03-28 DIAGNOSIS — R2681 Unsteadiness on feet: Secondary | ICD-10-CM

## 2018-03-28 NOTE — Telephone Encounter (Signed)
Tried to call back to see where she would like to be referred. No answer. No vm.

## 2018-03-28 NOTE — Telephone Encounter (Signed)
Spoke with patient. She lives close to [employer]. Referral sent to [facility] at [facility] [employer]. They will call her directly to schedule.

## 2018-03-28 NOTE — Telephone Encounter (Signed)
Patient is calling in needing a different rehab therapy place near her home. Please call her back at [phone]. Thanks!

## 2018-04-11 ENCOUNTER — Other Ambulatory Visit: Payer: Self-pay

## 2018-04-11 ENCOUNTER — Encounter: Payer: Self-pay | Admitting: Physical Therapy

## 2018-04-11 ENCOUNTER — Ambulatory Visit: Payer: Medicare PPO | Attending: Neurology | Admitting: Physical Therapy

## 2018-04-11 DIAGNOSIS — M545 Low back pain, unspecified: Secondary | ICD-10-CM

## 2018-04-11 DIAGNOSIS — R262 Difficulty in walking, not elsewhere classified: Secondary | ICD-10-CM | POA: Diagnosis not present

## 2018-04-11 DIAGNOSIS — R2681 Unsteadiness on feet: Secondary | ICD-10-CM | POA: Insufficient documentation

## 2018-04-11 DIAGNOSIS — R296 Repeated falls: Secondary | ICD-10-CM | POA: Diagnosis not present

## 2018-04-11 DIAGNOSIS — G8929 Other chronic pain: Secondary | ICD-10-CM | POA: Insufficient documentation

## 2018-04-11 DIAGNOSIS — R2689 Other abnormalities of gait and mobility: Secondary | ICD-10-CM | POA: Diagnosis not present

## 2018-04-11 NOTE — Therapy (Signed)
[place], [place], [address] Phone: [phone]   Fax:  [phone]  Physical Therapy Evaluation  Patient Details  Name: Jessica Mullins MRN: [id number] Date of Birth: [birth year] Referring Provider (PT): Tomi Likens   Encounter Date: 04/11/2018  PT End of Session - 04/11/18 1555    Visit Number  1    Date for PT Re-Evaluation  06/11/18    PT Start Time  1525    PT Stop Time  1600    PT Time Calculation (min)  35 min    Activity Tolerance  Patient tolerated treatment well    Behavior During Therapy  [facility] for tasks assessed/performed       Past Medical History:  Diagnosis Date  . Allergy   . Ankle fracture, left   . Breast cancer ([place]) 1115/16   left   . Bursitis of right shoulder   . Cataract   . Family history of cancer   . Fracture of right wrist   . GERD (gastroesophageal reflux disease)   . Hyperlipidemia   . Hypertension   . Skin cancer 2000   melanoma and basal cell  . Vaso vagal episode    during preparation for colonoscopy    Past Surgical History:  Procedure Laterality Date  . ABDOMINAL HYSTERECTOMY  1988   TAH/BSO--FIBROIDS  . APPENDECTOMY    . BREAST LUMPECTOMY     B/L--FCS  . BREAST LUMPECTOMY WITH RADIOACTIVE SEED AND SENTINEL LYMPH NODE BIOPSY Left 06/11/2015   Procedure: LEFT BREAST LUMPECTOMY WITH RADIOACTIVE SEED AND LEFT SENTINEL LYMPH NODE MAPPING;  Surgeon: Erroll Luna, MD;  Location: [place];  Service: General;  Laterality: Left;  . CATARACT EXTRACTION  2015  . COLONOSCOPY  2010  . DG  BONE DENSITY ([place] HX)    . EYE SURGERY Bilateral    cataracts  . fiberadenoma Bilateral 1978, 1980  . GANGLION CYST EXCISION     L  hand  . Lipiflow procedure      There were no vitals filed for this visit.   Subjective Assessment - 04/11/18 1523    Subjective  Patient reports that she has had some unsteady gait for about 2 years, she reports that it will come and go,  reports that she has had 2-3 falls in the past 6 months.  She reports that she feels like her legs are weak and give out.    Limitations  Walking;House hold activities    Patient Stated Goals  be stronger, have no falls, walk better    Currently in Pain?  Yes    Pain Score  2     Pain Location  Back    Pain Orientation  Upper;Mid;Lower    Pain Descriptors / Indicators  Aching;Tightness    Pain Type  Chronic pain    Pain Radiating Towards  NCVT was negative    Pain Onset  More than a month ago    Pain Frequency  Intermittent    Aggravating Factors   being still, in the AM, pain up to 8/10    Pain Relieving Factors  walking pain can go down to a 2/10    Effect of Pain on Daily Activities  "just sore"         [facility] PT Assessment - 04/11/18 0001      Assessment   Medical Diagnosis  unsteady gait, weakness, repeated falls.    Referring Provider (PT)  Tomi Likens  Onset Date/Surgical Date  03/12/18    Prior Therapy  no      Precautions   Precautions  None      Balance Screen   Has the patient fallen in the past 6 months  Yes    How many times?  3    Has the patient had a decrease in activity level because of a fear of falling?   Yes    Is the patient reluctant to leave their home because of a fear of falling?   No      Home Environment   Additional Comments  single level, does housework and some yardwork      Prior Function   Level of Independence  Independent    Vocation  Retired    Leisure  some exercise that she does on a TV program      ROM / Strength   AROM / PROM / Strength  AROM;Strength      AROM   Overall AROM Comments  Lumbar decreased 25%      Strength   Overall Strength Comments  Strength of the LE's 4/5      Transfers   Comments  can get up without hands but normally uses her arm to push up      Ambulation/Gait   Gait Comments  no device, s      Standardized Balance Assessment   Standardized Balance Assessment  Berg Balance Test;Timed Up and Go Test       Berg Balance Test   Sit to Stand  Able to stand without using hands and stabilize independently    Standing Unsupported  Able to stand safely 2 minutes    Sitting with Back Unsupported but Feet Supported on Floor or Stool  Able to sit safely and securely 2 minutes    Stand to Sit  Controls descent by using hands    Transfers  Able to transfer safely, definite need of hands    Standing Unsupported with Eyes Closed  Able to stand 10 seconds with supervision    Standing Ubsupported with Feet Together  Able to place feet together independently and stand 1 minute safely    From Standing, Reach Forward with Outstretched Arm  Can reach forward >12 cm safely (5")    From Standing Position, Pick up Object from Floor  Able to pick up shoe safely and easily    From Standing Position, Turn to Look Behind Over each Shoulder  Looks behind from both sides and weight shifts well    Turn 360 Degrees  Able to turn 360 degrees safely but slowly    Standing Unsupported, Alternately Place Feet on Step/Stool  Able to complete 4 steps without aid or supervision    Standing Unsupported, One Foot in Front  Able to take small step independently and hold 30 seconds    Standing on One Leg  Tries to lift leg/unable to hold 3 seconds but remains standing independently    Total Score  43      Timed Up and Go Test   Normal TUG (seconds)  16   had difficulty when she turned a LOB               Objective measurements completed on examination: See above findings.              PT Education - 04/11/18 1555    Education Details  encouraged her to continue with her current HEP doing the TV program  Person(s) Educated  Patient    Methods  Explanation    Comprehension  Verbalized understanding       PT Short Term Goals - 04/11/18 1716      PT SHORT TERM GOAL #1   Title  independent with initial HEP    Time  2    Period  Weeks    Status  New        PT Long Term Goals - 04/11/18 1717       PT LONG TERM GOAL #1   Title  patient to be independent with balance HEP     Time  8    Period  Weeks    Status  New      PT LONG TERM GOAL #2   Title  Patient to improve Berg to 48/56 demonstrating reduced fall risk    Time  8    Period  Weeks    Status  New      PT LONG TERM GOAL #3   Title  decresae TUG time to 13 seconds    Time  8    Period  Weeks    Status  New      PT LONG TERM GOAL #4   Title  Patient to demonstrate good safety awareness with no reported falls    Time  8    Period  Weeks    Status  New             Plan - 04/11/18 1600    Clinical Impression Statement  Patient reports that she has had some balance issues for 2 years, reports that over the past 6 months she has had 3 falls.  She reports that she is weak at times, during the eval she had two instances of almost losing balance when she was making turns.  She seemed strong, going up the stairs she was step over step, going down she did one at a time, she also reports that in the community she has a lot of trouble with curbs.    Clinical Presentation  Stable    Clinical Decision Making  Low    Rehab Potential  Good    PT Frequency  2x / week    PT Duration  8 weeks    PT Treatment/Interventions  ADLs/Self Care Home Management;Gait training;Balance training;Neuromuscular re-education;Therapeutic exercise;Therapeutic activities;Functional mobility training;Stair training;Manual techniques;Patient/family education;Electrical Stimulation;Moist Heat    PT Next Visit Plan  start higher level balance and exercises    Consulted and Agree with Plan of Care  Patient       Patient will benefit from skilled therapeutic intervention in order to improve the following deficits and impairments:  Abnormal gait, Decreased coordination, Difficulty walking, Decreased balance, Pain, Postural dysfunction, Decreased strength, Decreased mobility  Visit Diagnosis: Repeated falls - Plan: PT plan of care  cert/re-cert  Unsteadiness on feet - Plan: PT plan of care cert/re-cert  Chronic bilateral low back pain without sciatica - Plan: PT plan of care cert/re-cert  Difficulty in walking, not elsewhere classified - Plan: PT plan of care cert/re-cert     Problem List Patient Active Problem List   Diagnosis Date Noted  . Constipation by delayed colonic transit 12/13/2017  . Hemorrhoids 02/14/2017  . Atrial fibrillation ([place]) 08/10/2015  . Orthostatic hypotension 08/04/2015  . Leukocytosis 07/27/2015  . Genetic testing 07/27/2015  . Family history of cancer   . Cancer of central portion of left female breast ([place]) 06/01/2015  . Bilateral low back  pain without sciatica 11/18/2014  . Right shoulder pain 04/10/2014  . Muscle spasm of back 01/13/2014  . Olecranon bursitis of left elbow 08/26/2013  . Posterolateral cervical muscle strain 12/14/2012  . Hyperlipidemia 02/16/2010  . TMJ PAIN 02/16/2010  . SEBORRHEIC KERATOSIS 08/24/2009  . Essential hypertension 10/18/2007  . OSTEOPENIA 07/12/2007  . Melanoma of skin ([place]) 10/17/2006  . BREAST CYST 10/17/2006  . COLONOSCOPY, HX OF 10/17/2006    Sumner Boast., PT 04/11/2018, 5:20 PM  [place] [address] [place], [place], [address] Phone: [phone]   Fax:  [phone]  Name: Jessica Mullins MRN: [id number] Date of Birth: August 12, 1930

## 2018-04-19 ENCOUNTER — Encounter: Payer: Self-pay | Admitting: Physical Therapy

## 2018-04-19 ENCOUNTER — Ambulatory Visit: Payer: Medicare PPO | Admitting: Physical Therapy

## 2018-04-19 DIAGNOSIS — R2681 Unsteadiness on feet: Secondary | ICD-10-CM | POA: Diagnosis not present

## 2018-04-19 DIAGNOSIS — R296 Repeated falls: Secondary | ICD-10-CM

## 2018-04-19 DIAGNOSIS — M545 Low back pain: Secondary | ICD-10-CM | POA: Diagnosis not present

## 2018-04-19 DIAGNOSIS — R2689 Other abnormalities of gait and mobility: Secondary | ICD-10-CM | POA: Diagnosis not present

## 2018-04-19 DIAGNOSIS — G8929 Other chronic pain: Secondary | ICD-10-CM | POA: Diagnosis not present

## 2018-04-19 DIAGNOSIS — R262 Difficulty in walking, not elsewhere classified: Secondary | ICD-10-CM | POA: Diagnosis not present

## 2018-04-19 NOTE — Therapy (Signed)
[place], [place], [address] Phone: [phone]   Fax:  [phone]  Physical Therapy Treatment  Patient Details  Name: Jessica Mullins MRN: [id number] Date of Birth: 05-16-1931 Referring Provider (PT): Tomi Likens   Encounter Date: 04/19/2018  PT End of Session - 04/19/18 1016    Visit Number  2    Date for PT Re-Evaluation  06/11/18    PT Start Time  0930    PT Stop Time  5284    PT Time Calculation (min)  45 min    Activity Tolerance  Patient tolerated treatment well    Behavior During Therapy  [facility] for tasks assessed/performed       Past Medical History:  Diagnosis Date  . Allergy   . Ankle fracture, left   . Breast cancer ([place]) 1115/16   left   . Bursitis of right shoulder   . Cataract   . Family history of cancer   . Fracture of right wrist   . GERD (gastroesophageal reflux disease)   . Hyperlipidemia   . Hypertension   . Skin cancer 2000   melanoma and basal cell  . Vaso vagal episode    during preparation for colonoscopy    Past Surgical History:  Procedure Laterality Date  . ABDOMINAL HYSTERECTOMY  1988   TAH/BSO--FIBROIDS  . APPENDECTOMY    . BREAST LUMPECTOMY     B/L--FCS  . BREAST LUMPECTOMY WITH RADIOACTIVE SEED AND SENTINEL LYMPH NODE BIOPSY Left 06/11/2015   Procedure: LEFT BREAST LUMPECTOMY WITH RADIOACTIVE SEED AND LEFT SENTINEL LYMPH NODE MAPPING;  Surgeon: Erroll Luna, MD;  Location: [place];  Service: General;  Laterality: Left;  . CATARACT EXTRACTION  2015  . COLONOSCOPY  2010  . DG  BONE DENSITY ([place] HX)    . EYE SURGERY Bilateral    cataracts  . fiberadenoma Bilateral 1978, 1980  . GANGLION CYST EXCISION     L  hand  . Lipiflow procedure      There were no vitals filed for this visit.  Subjective Assessment - 04/19/18 0929    Subjective  No falls since evaluation. No new issues    Currently in Pain?  Yes    Pain Score  2     Pain Location  Neck                        [facility] Adult PT Treatment/Exercise - 04/19/18 0001      High Level Balance   High Level Balance Activities  Side stepping;Backward walking;Negotiating over obstacles    High Level Balance Comments  Front and side step over foam roll       Exercises   Exercises  Knee/Hip      Knee/Hip Exercises: Aerobic   Nustep  L3 x 6 min       Knee/Hip Exercises: Seated   Long Arc Quad  Both;2 sets;10 reps    Long Arc Quad Weight  2 lbs.    Ball Squeeze  2x10 3''    Marching  Both;2 sets;10 reps;Weights    Marching Limitations  2    Hamstring Curl  10 reps;2 sets;Both    Hamstring Limitations  green     Abduction/Adduction   Both;1 set;10 reps   manual resistance    Sit to Sand  2 sets;10 reps;without UE support  PT Short Term Goals - 04/11/18 1716      PT SHORT TERM GOAL #1   Title  independent with initial HEP    Time  2    Period  Weeks    Status  New        PT Long Term Goals - 04/11/18 1717      PT LONG TERM GOAL #1   Title  patient to be independent with balance HEP     Time  8    Period  Weeks    Status  New      PT LONG TERM GOAL #2   Title  Patient to improve Berg to 48/56 demonstrating reduced fall risk    Time  8    Period  Weeks    Status  New      PT LONG TERM GOAL #3   Title  decresae TUG time to 13 seconds    Time  8    Period  Weeks    Status  New      PT LONG TERM GOAL #4   Title  Patient to demonstrate good safety awareness with no reported falls    Time  8    Period  Weeks    Status  New            Plan - 04/19/18 1020    Clinical Impression Statement  pt did well with all seated LE exercises, Exercise were broken up with balance interventions in between, She does report some LE fatigue. Initial L knee pain reported with sit to stands but that went away after 3 reps. Somer instability when stepping over objects. Narrow base of support with backwards walking causing some LOB. Cues to  take bigger steps with backwards walking.    Rehab Potential  Good    PT Frequency  2x / week    PT Duration  8 weeks    PT Treatment/Interventions  ADLs/Self Care Home Management;Gait training;Balance training;Neuromuscular re-education;Therapeutic exercise;Therapeutic activities;Functional mobility training;Stair training;Manual techniques;Patient/family education;Electrical Stimulation;Moist Heat    PT Next Visit Plan  start higher level balance and exercises       Patient will benefit from skilled therapeutic intervention in order to improve the following deficits and impairments:  Abnormal gait, Decreased coordination, Difficulty walking, Decreased balance, Pain, Postural dysfunction, Decreased strength, Decreased mobility  Visit Diagnosis: Unsteadiness on feet  Repeated falls     Problem List Patient Active Problem List   Diagnosis Date Noted  . Constipation by delayed colonic transit 12/13/2017  . Hemorrhoids 02/14/2017  . Atrial fibrillation ([place]) 08/10/2015  . Orthostatic hypotension 08/04/2015  . Leukocytosis 07/27/2015  . Genetic testing 07/27/2015  . Family history of cancer   . Cancer of central portion of left female breast ([place]) 06/01/2015  . Bilateral low back pain without sciatica 11/18/2014  . Right shoulder pain 04/10/2014  . Muscle spasm of back 01/13/2014  . Olecranon bursitis of left elbow 08/26/2013  . Posterolateral cervical muscle strain 12/14/2012  . Hyperlipidemia 02/16/2010  . TMJ PAIN 02/16/2010  . SEBORRHEIC KERATOSIS 08/24/2009  . Essential hypertension 10/18/2007  . OSTEOPENIA 07/12/2007  . Melanoma of skin ([place]) 10/17/2006  . BREAST CYST 10/17/2006  . COLONOSCOPY, HX OF 10/17/2006    Scot Jun, PTA 04/19/2018, 10:27 AM  [address] [place], [place], [address] Phone: [phone]   Fax:  [phone]  Name: Yulianna L Ross MRN: [id number] Date of  Birth: [birth year]

## 2018-04-23 ENCOUNTER — Ambulatory Visit: Payer: Medicare PPO | Admitting: Physical Therapy

## 2018-04-23 DIAGNOSIS — R262 Difficulty in walking, not elsewhere classified: Secondary | ICD-10-CM | POA: Diagnosis not present

## 2018-04-23 DIAGNOSIS — R296 Repeated falls: Secondary | ICD-10-CM

## 2018-04-23 DIAGNOSIS — R2689 Other abnormalities of gait and mobility: Secondary | ICD-10-CM | POA: Diagnosis not present

## 2018-04-23 DIAGNOSIS — M545 Low back pain: Secondary | ICD-10-CM | POA: Diagnosis not present

## 2018-04-23 DIAGNOSIS — R2681 Unsteadiness on feet: Secondary | ICD-10-CM | POA: Diagnosis not present

## 2018-04-23 DIAGNOSIS — G8929 Other chronic pain: Secondary | ICD-10-CM | POA: Diagnosis not present

## 2018-04-23 NOTE — Therapy (Addendum)
[address] [place], [place], [address] Phone: [phone]   Fax:  [phone]  Physical Therapy Treatment  Patient Details  Name: Jessica Mullins MRN: [id number] Date of Birth: 01-05-1931 Referring Provider (PT): Tomi Likens   Encounter Date: 04/23/2018  PT End of Session - 04/23/18 1023    Visit Number  3    Date for PT Re-Evaluation  06/11/18    PT Start Time  0930    PT Stop Time  6063    PT Time Calculation (min)  45 min    Activity Tolerance  Patient tolerated treatment well    Behavior During Therapy  [facility] for tasks assessed/performed       Past Medical History:  Diagnosis Date  . Allergy   . Ankle fracture, left   . Breast cancer ([place]) 1115/16   left   . Bursitis of right shoulder   . Cataract   . Family history of cancer   . Fracture of right wrist   . GERD (gastroesophageal reflux disease)   . Hyperlipidemia   . Hypertension   . Skin cancer 2000   melanoma and basal cell  . Vaso vagal episode    during preparation for colonoscopy    Past Surgical History:  Procedure Laterality Date  . ABDOMINAL HYSTERECTOMY  1988   TAH/BSO--FIBROIDS  . APPENDECTOMY    . BREAST LUMPECTOMY     B/L--FCS  . BREAST LUMPECTOMY WITH RADIOACTIVE SEED AND SENTINEL LYMPH NODE BIOPSY Left 06/11/2015   Procedure: LEFT BREAST LUMPECTOMY WITH RADIOACTIVE SEED AND LEFT SENTINEL LYMPH NODE MAPPING;  Surgeon: Erroll Luna, MD;  Location: [place];  Service: General;  Laterality: Left;  . CATARACT EXTRACTION  2015  . COLONOSCOPY  2010  . DG  BONE DENSITY ([place] HX)    . EYE SURGERY Bilateral    cataracts  . fiberadenoma Bilateral 1978, 1980  . GANGLION CYST EXCISION     L  hand  . Lipiflow procedure      There were no vitals filed for this visit.  Subjective Assessment - 04/23/18 0935    Subjective  "Been the same old crazyness with balance issues, but I havnt fall" "One day I will be fine and the next im  walking around like a drunk person"    Currently in Pain?  No/denies                       [facility] Adult PT Treatment/Exercise - 04/23/18 0001      High Level Balance   High Level Balance Activities  Backward walking;Braiding;Tandem walking;Negotitating around obstacles   all HHA   High Level Balance Comments  Hip 3 ways-HHA      Exercises   Exercises  Knee/Hip      Knee/Hip Exercises: Aerobic   Nustep  L3 x 6 min       Knee/Hip Exercises: Machines for Strengthening   Cybex Knee Extension  2x10, 5lbs    Cybex Knee Flexion  2x10, 10lbs      Knee/Hip Exercises: Standing   Other Standing Knee Exercises  --   Marching, Forward & Backwards with HHA, 2.5 lb weights     Knee/Hip Exercises: Seated   Ball Squeeze  2x10 3''    Abduction/Adduction   Strengthening;2 sets;10 reps   green Tband   Sit to Sand  2 sets;10 reps;without UE support  PT Education - 04/23/18 1020    Education Details  Balance exercises at counter, marching, side sttepping, tandem, alternating hip 3 way    Person(s) Educated  Patient    Methods  Explanation;Demonstration;Handout    Comprehension  Verbalized understanding;Returned demonstration       PT Short Term Goals - 04/23/18 1022      PT SHORT TERM GOAL #1   Title  independent with initial HEP    Status  Achieved        PT Long Term Goals - 04/11/18 1717      PT LONG TERM GOAL #1   Title  patient to be independent with balance HEP     Time  8    Period  Weeks    Status  New      PT LONG TERM GOAL #2   Title  Patient to improve Berg to 48/56 demonstrating reduced fall risk    Time  8    Period  Weeks    Status  New      PT LONG TERM GOAL #3   Title  decresae TUG time to 13 seconds    Time  8    Period  Weeks    Status  New      PT LONG TERM GOAL #4   Title  Patient to demonstrate good safety awareness with no reported falls    Time  8    Period  Weeks    Status  New            Plan -  04/23/18 1024    Clinical Impression Statement  Pt tolerated new LE strengthening exercises well, Pt needed minimal VC's for sequencing with braided side stepping, Pt continues to report some LE fatigue with frequent LOB with tandem walking.     Rehab Potential  Good    PT Frequency  2x / week    PT Duration  8 weeks    PT Treatment/Interventions  ADLs/Self Care Home Management;Gait training;Balance training;Neuromuscular re-education;Therapeutic exercise;Therapeutic activities;Functional mobility training;Stair training;Manual techniques;Patient/family education;Electrical Stimulation;Moist Heat    PT Next Visit Plan  start higher level balance and exercises    Consulted and Agree with Plan of Care  Patient       Patient will benefit from skilled therapeutic intervention in order to improve the following deficits and impairments:  Abnormal gait, Decreased coordination, Difficulty walking, Decreased balance, Pain, Postural dysfunction, Decreased strength, Decreased mobility  Visit Diagnosis: Unsteadiness on feet  Repeated falls  Difficulty in walking, not elsewhere classified     Problem List Patient Active Problem List   Diagnosis Date Noted  . Constipation by delayed colonic transit 12/13/2017  . Hemorrhoids 02/14/2017  . Atrial fibrillation ([place]) 08/10/2015  . Orthostatic hypotension 08/04/2015  . Leukocytosis 07/27/2015  . Genetic testing 07/27/2015  . Family history of cancer   . Cancer of central portion of left female breast ([place]) 06/01/2015  . Bilateral low back pain without sciatica 11/18/2014  . Right shoulder pain 04/10/2014  . Muscle spasm of back 01/13/2014  . Olecranon bursitis of left elbow 08/26/2013  . Posterolateral cervical muscle strain 12/14/2012  . Hyperlipidemia 02/16/2010  . TMJ PAIN 02/16/2010  . SEBORRHEIC KERATOSIS 08/24/2009  . Essential hypertension 10/18/2007  . OSTEOPENIA 07/12/2007  . Melanoma of skin ([place]) 10/17/2006  . BREAST CYST  10/17/2006  . COLONOSCOPY, HX OF 10/17/2006   During this treatment session, the therapist was present, participating in and directing the treatment. APayseur PTA  Howell Rucks, SPTA 04/23/2018, 10:50 AM  [place] [place] [place], [place], [address] Phone: [phone]   Fax:  [phone]  Name: Jessica Mullins MRN: [id number] Date of Birth: [birth year]

## 2018-04-26 ENCOUNTER — Ambulatory Visit: Payer: Medicare PPO | Admitting: Physical Therapy

## 2018-04-26 DIAGNOSIS — R2681 Unsteadiness on feet: Secondary | ICD-10-CM

## 2018-04-26 DIAGNOSIS — R262 Difficulty in walking, not elsewhere classified: Secondary | ICD-10-CM | POA: Diagnosis not present

## 2018-04-26 DIAGNOSIS — M545 Low back pain, unspecified: Secondary | ICD-10-CM

## 2018-04-26 DIAGNOSIS — G8929 Other chronic pain: Secondary | ICD-10-CM | POA: Diagnosis not present

## 2018-04-26 DIAGNOSIS — R2689 Other abnormalities of gait and mobility: Secondary | ICD-10-CM | POA: Diagnosis not present

## 2018-04-26 DIAGNOSIS — R296 Repeated falls: Secondary | ICD-10-CM

## 2018-04-26 NOTE — Therapy (Signed)
[address] [place], [place], [address] Phone: [phone]   Fax:  [phone]  Physical Therapy Treatment  Patient Details  Name: Jessica Mullins MRN: [id number] Date of Birth: 08-26-1930 Referring Provider (PT): Tomi Likens   Encounter Date: 04/26/2018  PT End of Session - 04/26/18 1015    Visit Number  4    Date for PT Re-Evaluation  06/11/18    PT Start Time  0932    PT Stop Time  1015    PT Time Calculation (min)  43 min    Activity Tolerance  Patient tolerated treatment well    Behavior During Therapy  [facility] for tasks assessed/performed       Past Medical History:  Diagnosis Date  . Allergy   . Ankle fracture, left   . Breast cancer ([place]) 1115/16   left   . Bursitis of right shoulder   . Cataract   . Family history of cancer   . Fracture of right wrist   . GERD (gastroesophageal reflux disease)   . Hyperlipidemia   . Hypertension   . Skin cancer 2000   melanoma and basal cell  . Vaso vagal episode    during preparation for colonoscopy    Past Surgical History:  Procedure Laterality Date  . ABDOMINAL HYSTERECTOMY  1988   TAH/BSO--FIBROIDS  . APPENDECTOMY    . BREAST LUMPECTOMY     B/L--FCS  . BREAST LUMPECTOMY WITH RADIOACTIVE SEED AND SENTINEL LYMPH NODE BIOPSY Left 06/11/2015   Procedure: LEFT BREAST LUMPECTOMY WITH RADIOACTIVE SEED AND LEFT SENTINEL LYMPH NODE MAPPING;  Surgeon: Erroll Luna, MD;  Location: [place];  Service: General;  Laterality: Left;  . CATARACT EXTRACTION  2015  . COLONOSCOPY  2010  . DG  BONE DENSITY ([place] HX)    . EYE SURGERY Bilateral    cataracts  . fiberadenoma Bilateral 1978, 1980  . GANGLION CYST EXCISION     L  hand  . Lipiflow procedure      There were no vitals filed for this visit.  Subjective Assessment - 04/26/18 0932    Subjective  "Im doing ok, I havnt fallen so that is good"    Pain Score  4     Pain Location  Neck    Pain Orientation   Left                       [facility] Adult PT Treatment/Exercise - 04/26/18 0001      High Level Balance   High Level Balance Activities  Backward walking;Braiding;Tandem walking;Negotitating around obstacles;Negotiating over obstacles    High Level Balance Comments  Hip 3 ways at counter   yellow band     Exercises   Exercises  Neck;Knee/Hip      Knee/Hip Exercises: Aerobic   Recumbent Bike  6 min      Knee/Hip Exercises: Machines for Strengthening   Cybex Knee Extension  2x10, 10lbs    Cybex Knee Flexion  2x10, 15lbs      Knee/Hip Exercises: Standing   Lateral Step Up  2 sets;10 reps;Step Height: 4";Right;Left    Forward Step Up  2 sets;10 reps;Step Height: 4"      Knee/Hip Exercises: Seated   Sit to Sand  2 sets;10 reps;without UE support   holding red ball              PT Short Term Goals - 04/23/18 1022  PT SHORT TERM GOAL #1   Title  independent with initial HEP    Status  Achieved        PT Long Term Goals - 04/26/18 1018      PT LONG TERM GOAL #1   Title  patient to be independent with balance HEP     Status  Achieved      PT LONG TERM GOAL #4   Title  Patient to demonstrate good safety awareness with no reported falls    Status  Partially Met            Plan - 04/26/18 1015    Clinical Impression Statement  Pt was very motivated and stated that she has been doing her HEP at home. Pt tolerated added exercises well. Pt was able to perform high level balance activites with fewer LOB as related to last session. Pt required minimal VC for sequencing of tandem walking with a couple LOB.        Patient will benefit from skilled therapeutic intervention in order to improve the following deficits and impairments:     Visit Diagnosis: Unsteadiness on feet  Repeated falls  Difficulty in walking, not elsewhere classified  Chronic bilateral low back pain without sciatica  Other abnormalities of gait and  mobility     Problem List Patient Active Problem List   Diagnosis Date Noted  . Constipation by delayed colonic transit 12/13/2017  . Hemorrhoids 02/14/2017  . Atrial fibrillation ([place]) 08/10/2015  . Orthostatic hypotension 08/04/2015  . Leukocytosis 07/27/2015  . Genetic testing 07/27/2015  . Family history of cancer   . Cancer of central portion of left female breast ([place]) 06/01/2015  . Bilateral low back pain without sciatica 11/18/2014  . Right shoulder pain 04/10/2014  . Muscle spasm of back 01/13/2014  . Olecranon bursitis of left elbow 08/26/2013  . Posterolateral cervical muscle strain 12/14/2012  . Hyperlipidemia 02/16/2010  . TMJ PAIN 02/16/2010  . SEBORRHEIC KERATOSIS 08/24/2009  . Essential hypertension 10/18/2007  . OSTEOPENIA 07/12/2007  . Melanoma of skin ([place]) 10/17/2006  . BREAST CYST 10/17/2006  . COLONOSCOPY, HX OF 10/17/2006    Howell Rucks, SPTA 04/26/2018, 10:21 AM  [address] [place], [place], [address] Phone: [phone]   Fax:  [phone]  Name: Jessica Mullins MRN: [id number] Date of Birth: 31-Jan-1931

## 2018-05-01 ENCOUNTER — Ambulatory Visit: Payer: Medicare PPO | Attending: Neurology | Admitting: Physical Therapy

## 2018-05-01 DIAGNOSIS — R262 Difficulty in walking, not elsewhere classified: Secondary | ICD-10-CM | POA: Diagnosis not present

## 2018-05-01 DIAGNOSIS — R296 Repeated falls: Secondary | ICD-10-CM | POA: Diagnosis not present

## 2018-05-01 DIAGNOSIS — R2681 Unsteadiness on feet: Secondary | ICD-10-CM | POA: Diagnosis not present

## 2018-05-01 DIAGNOSIS — G8929 Other chronic pain: Secondary | ICD-10-CM | POA: Diagnosis not present

## 2018-05-01 DIAGNOSIS — M545 Low back pain, unspecified: Secondary | ICD-10-CM

## 2018-05-01 DIAGNOSIS — R2689 Other abnormalities of gait and mobility: Secondary | ICD-10-CM | POA: Diagnosis not present

## 2018-05-01 NOTE — Therapy (Signed)
[address] [place], [place], [address] Phone: [phone]   Fax:  [phone]  Physical Therapy Treatment  Patient Details  Name: Jessica Mullins MRN: [id number] Date of Birth: 1931/05/04 Referring Provider (PT): Tomi Likens   Encounter Date: 05/01/2018  PT End of Session - 05/01/18 1536    Visit Number  5    Date for PT Re-Evaluation  06/11/18    PT Start Time  1450    PT Stop Time  1530    PT Time Calculation (min)  40 min    Activity Tolerance  Patient tolerated treatment well    Behavior During Therapy  [facility] for tasks assessed/performed       Past Medical History:  Diagnosis Date  . Allergy   . Ankle fracture, left   . Breast cancer ([place]) 1115/16   left   . Bursitis of right shoulder   . Cataract   . Family history of cancer   . Fracture of right wrist   . GERD (gastroesophageal reflux disease)   . Hyperlipidemia   . Hypertension   . Skin cancer 2000   melanoma and basal cell  . Vaso vagal episode    during preparation for colonoscopy    Past Surgical History:  Procedure Laterality Date  . ABDOMINAL HYSTERECTOMY  1988   TAH/BSO--FIBROIDS  . APPENDECTOMY    . BREAST LUMPECTOMY     B/L--FCS  . BREAST LUMPECTOMY WITH RADIOACTIVE SEED AND SENTINEL LYMPH NODE BIOPSY Left 06/11/2015   Procedure: LEFT BREAST LUMPECTOMY WITH RADIOACTIVE SEED AND LEFT SENTINEL LYMPH NODE MAPPING;  Surgeon: Erroll Luna, MD;  Location: [place];  Service: General;  Laterality: Left;  . CATARACT EXTRACTION  2015  . COLONOSCOPY  2010  . DG  BONE DENSITY ([place] HX)    . EYE SURGERY Bilateral    cataracts  . fiberadenoma Bilateral 1978, 1980  . GANGLION CYST EXCISION     L  hand  . Lipiflow procedure      There were no vitals filed for this visit.  Subjective Assessment - 05/01/18 1454    Subjective  "Iv been alright, cant think of anything wrong"    Currently in Pain?  No/denies                        [facility] Adult PT Treatment/Exercise - 05/01/18 0001      High Level Balance   High Level Balance Comments  Marching on Airex, heel raises on airex    HHA needed     Exercises   Exercises  Knee/Hip      Knee/Hip Exercises: Aerobic   Nustep  L4 x 6 min       Knee/Hip Exercises: Machines for Strengthening   Cybex Knee Extension  2x10, 10lbs    Cybex Knee Flexion  2x10, 20lbs    Cybex Leg Press  3x10, 20 lbs      Knee/Hip Exercises: Standing   Lateral Step Up  2 sets;10 reps;Right;Left;Step Height: 6"    Forward Step Up  2 sets;10 reps;Step Height: 6"    Walking with Sports Cord  5 lbs forward and sideways               PT Short Term Goals - 04/23/18 1022      PT SHORT TERM GOAL #1   Title  independent with initial HEP    Status  Achieved  PT Long Term Goals - 05/01/18 1539      PT LONG TERM GOAL #4   Title  Patient to demonstrate good safety awareness with no reported falls    Time  8    Period  Weeks    Status  Achieved            Plan - 05/01/18 1537    Clinical Impression Statement  Pt demonstrated improved strength with all exercises. Pt presented with decreasewd confidence with steps but showed improved confidence with practicing steps. Pt showed improved balance and was able to preform step ups without holding on. Pt required HHA with airex activities due to a few LOB.    Rehab Potential  Good    PT Frequency  2x / week    PT Duration  8 weeks    PT Treatment/Interventions  ADLs/Self Care Home Management;Gait training;Balance training;Neuromuscular re-education;Therapeutic exercise;Therapeutic activities;Functional mobility training;Stair training;Manual techniques;Patient/family education;Electrical Stimulation;Moist Heat    PT Next Visit Plan  Continue progressing with strength and balance    Consulted and Agree with Plan of Care  Patient       Patient will benefit from skilled therapeutic intervention in  order to improve the following deficits and impairments:  Abnormal gait, Decreased coordination, Difficulty walking, Decreased balance, Pain, Postural dysfunction, Decreased strength, Decreased mobility  Visit Diagnosis: Unsteadiness on feet  Repeated falls  Difficulty in walking, not elsewhere classified  Chronic bilateral low back pain without sciatica     Problem List Patient Active Problem List   Diagnosis Date Noted  . Constipation by delayed colonic transit 12/13/2017  . Hemorrhoids 02/14/2017  . Atrial fibrillation ([place]) 08/10/2015  . Orthostatic hypotension 08/04/2015  . Leukocytosis 07/27/2015  . Genetic testing 07/27/2015  . Family history of cancer   . Cancer of central portion of left female breast ([place]) 06/01/2015  . Bilateral low back pain without sciatica 11/18/2014  . Right shoulder pain 04/10/2014  . Muscle spasm of back 01/13/2014  . Olecranon bursitis of left elbow 08/26/2013  . Posterolateral cervical muscle strain 12/14/2012  . Hyperlipidemia 02/16/2010  . TMJ PAIN 02/16/2010  . SEBORRHEIC KERATOSIS 08/24/2009  . Essential hypertension 10/18/2007  . OSTEOPENIA 07/12/2007  . Melanoma of skin ([place]) 10/17/2006  . BREAST CYST 10/17/2006  . COLONOSCOPY, HX OF 10/17/2006    Howell Rucks, SPTA 05/01/2018, 3:40 PM  [address] [address] [place], [place], [address] Phone: [phone]   Fax:  [phone]  Name: Jessica Mullins MRN: [id number] Date of Birth: [birth year]

## 2018-05-03 ENCOUNTER — Other Ambulatory Visit: Payer: Self-pay | Admitting: Hematology and Oncology

## 2018-05-03 ENCOUNTER — Encounter: Payer: Self-pay | Admitting: Internal Medicine

## 2018-05-03 ENCOUNTER — Ambulatory Visit: Payer: Medicare PPO | Admitting: Internal Medicine

## 2018-05-03 VITALS — BP 126/78 | HR 68 | Ht 61.0 in | Wt 163.0 lb

## 2018-05-03 DIAGNOSIS — I4819 Other persistent atrial fibrillation: Secondary | ICD-10-CM

## 2018-05-03 DIAGNOSIS — K5903 Drug induced constipation: Secondary | ICD-10-CM | POA: Diagnosis not present

## 2018-05-03 DIAGNOSIS — K641 Second degree hemorrhoids: Secondary | ICD-10-CM | POA: Diagnosis not present

## 2018-05-03 DIAGNOSIS — N816 Rectocele: Secondary | ICD-10-CM | POA: Diagnosis not present

## 2018-05-03 NOTE — Patient Instructions (Signed)
Try taking 1/2 dose of Miralax daily.   Call us back if not working.   About Rectocele  Overview  A rectocele is a type of hernia which causes different degrees of bulging of the rectal tissues into the vaginal wall.  You may even notice that it presses against the vaginal wall so much that some vaginal tissues droop outside of the opening of your vagina.  Causes of Rectocele  The most common cause is childbirth.  The muscles and ligaments in the pelvis that hold up and support the female organs and vagina become stretched and weakened during labor and delivery.  The more babies you have, the more the support tissues are stretched and weakened.  Not everyone who has a baby will develop a rectocele.  Some women have stronger supporting tissue in the pelvis and may not have as much of a problem as others.  Women who have a Cesarean section usually do not get rectocele's unless they pushed a long time prior to the cesarean delivery.  Other conditions that can cause a rectocele include chronic constipation, a chronic cough, a lot of heavy lifting, and obesity.  Older women may have this problem because the loss of female hormones causes the vaginal tissue to become weaker.  Symptoms  There may not be any symptoms.  If you do have symptoms, they may include:  Pelvic pressure in the rectal area  Protrusion of the lower part of the vagina through the opening of the vagina  Constipation and trapping of the stool, making it difficult to have a bowel movement.  In severe cases, you may have to press on the lower part of your vagina to help push the stool out of you rectum.  This is called splinting to empty.  Diagnosing Rectocele  Your health care provider will ask about your symptoms and perform a pelvic exam.  S/he will ask you to bear down, pushing like you are having a bowel movement so as to see how far the lower part of the vagina protrudes into the vagina and possible outside of the vagina.   Your provider will also ask you to contract the muscles of your pelvis (like you are stopping the stream in the middle of urinating) to determine the strength of your pelvic muscles.  Your provider may also do a rectal exam.  Treatment Options  If you do not have any symptoms, no treatment may be necessary.  Other treatment options include:  Pelvic floor exercises: Contracting the muscles in your genital area may help strengthen your muscles and support the organs.  Be sure to get proper exercise instruction from you physical therapist.  A pessary (removealbe pelvic support device) sometimes helps rectocele symptoms.  Surgery: Surgical repair may be necessary. In some cases the uterus may need to be taken out ( a hysterectomy) as well.  There are many types of surgery for pelvic support problems.  Look for physicians who specialize in repair procedures.  You can take care of yourself by:  Treating and preventing constipation  Avoiding heavy lifting, and lifting correctly (with your legs, not with you waist or back)  Treating a chronic cough or bronchitis  Not smoking  avoiding too much weight gain  Doing pelvic floor exercises   2007, [employer] Doc.[email or web address]     I appreciate the opportunity to care for you. Silvano Rusk, MD, [facility]

## 2018-05-03 NOTE — Progress Notes (Signed)
Jessica Mullins 84 y.o. 1931-05-16 [id number]  Assessment & Plan:   Encounter Diagnoses  Name Primary?  . Drug-induced constipation Yes  . Prolapsed internal hemorrhoids, grade 2   . Rectocele     I think she has constipation related to diltiazem.  She needs a more regular laxative dose rather than intermittent MiraLAX. 1/2 dose MiraLax daily will be tried and she can titrate for effect. Do not think the rectocele is part of her problem but it is noted.  See me as needed.  I appreciate the opportunity to care for this patient. CC: Jessica Held, DO   Subjective:   Chief Complaint: Constipation and hemorrhoids and some diarrhea  HPI The patient is an 84 year old woman that said after being treated for breast cancer with chemotherapy she developed atrial fibrillation when on Eliquis and then had to go on Cardizem and flecainide.  She believes Cardizem has constipated her and she is been straining more and feels like her hemorrhoids are swollen and there is a constant sensation like she needs to defecate.  She is tried Metamucil apples and fiber and other fruits with some relief.  Stools are small.  If she takes MiraLAX she will move her bowels but she will start to have diarrhea but she waits for no bowel movement for several days and then drinks it for 2 or 3 days in a row and then will have multiple bowel movements.  He denies any rectal bleeding.  There is mild anal discomfort.  Last colonoscopy was in 2011 and it showed hemorrhoids internal and external no other abnormalities. Allergies  Allergen Reactions  . Sulfa Antibiotics Other (See Comments)    As a child, rigid as a stick and not responsive  . Tape Other (See Comments)    Blisters, Please use "paper" tape only for short periods of time  . Glucosamine Forte [Nutritional Supplements] Rash  . Latex Rash   Current Meds  Medication Sig  . alendronate (FOSAMAX) 70 MG tablet TAKE ONE TABLET BY MOUTH EVERY SEVEN  DAYS. TAKE WITH FULL GLASS OF WATER AT NOON DAILY ON EMPTY STOMACH  . Biotin (BIOTIN 5000) 5 MG CAPS Take 5 mg by mouth daily.   . Calcium Carbonate-Vitamin D (CALTRATE 600+D) 600-400 MG-UNIT per tablet Take 1 tablet by mouth daily.   . Cholecalciferol (VITAMIN D3) 1000 UNITS CAPS Take 1 capsule by mouth daily.    . cycloSPORINE (RESTASIS) 0.05 % ophthalmic emulsion PLACE 1 DROP INTO BOTH EYES TWO (TWO) TIMES DAILY.  [name] diltiazem (CARDIZEM CD) 180 MG 24 hr capsule TAKE 1 CAPSULE (180 MG TOTAL) BY MOUTH DAILY.  . flecainide (TAMBOCOR) 50 MG tablet TAKE 1 TABLET (50 MG TOTAL) BY MOUTH TWO TIMES DAILY.  [name] ketoconazole (NIZORAL) 2 % cream Apply 1 application topically daily as needed for irritation.   [name] loratadine (CLARITIN) 10 MG tablet Take 10 mg by mouth daily.  . Omega-3 Fatty Acids (FISH OIL PO) Take 1 tablet by mouth daily.   . polyethylene glycol (MIRALAX / GLYCOLAX) packet Take 17 g by mouth daily as needed.  . pravastatin (PRAVACHOL) 40 MG tablet TAKE 1 TABLET (40 MG TOTAL) BY MOUTH DAILY. REPEAT LABS ARE DUE NOW  . Propylene Glycol 0.6 % SOLN Apply 1 drop to eye daily as needed (dry eyes).   . Psyllium (METAMUCIL PO) Take 15 mLs by mouth daily.   . Turmeric 500 MG CAPS Take 1 capsule by mouth daily.   . [DISCONTINUED] Arne Cleveland  5 MG TABS tablet TAKE 1 TABLET (5 MG TOTAL) BY MOUTH TWO TIMES DAILY.   Past Medical History:  Diagnosis Date  . Allergy   . Ankle fracture, left   . Breast cancer ([place]) 1115/16   left   . Bursitis of right shoulder   . Cataract   . Family history of cancer   . Fracture of right wrist   . GERD (gastroesophageal reflux disease)   . Hyperlipidemia   . Hypertension   . Skin cancer 2000   melanoma and basal cell  . Vaso vagal episode    during preparation for colonoscopy   Past Surgical History:  Procedure Laterality Date  . ABDOMINAL HYSTERECTOMY  1988   TAH/BSO--FIBROIDS  . APPENDECTOMY    . BREAST LUMPECTOMY     B/L--FCS  . BREAST LUMPECTOMY WITH  RADIOACTIVE SEED AND SENTINEL LYMPH NODE BIOPSY Left 06/11/2015   Procedure: LEFT BREAST LUMPECTOMY WITH RADIOACTIVE SEED AND LEFT SENTINEL LYMPH NODE MAPPING;  Surgeon: Erroll Luna, MD;  Location: [facility];  Service: General;  Laterality: Left;  . CATARACT EXTRACTION  2015  . COLONOSCOPY  2010  . DG  BONE DENSITY ([place] HX)    . EYE SURGERY Bilateral    cataracts  . fiberadenoma Bilateral 1978, 1980  . GANGLION CYST EXCISION     L  hand  . Lipiflow procedure     Social History   Social History Narrative   Patient is widowed.  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    One son one daughter   1 caffeinated beverage daily   She is right-handed.    She works out everyday to a televised [employer] 30 minute program.   No tobacco or alcohol   family history includes Cancer in her mother; Coronary artery disease in her brother; Diabetes in her brother and maternal aunt; Heart disease in her brother; Heart disease (age of onset: 24) in her father; Hyperlipidemia in her brother; Hypertension in her brother; Lung cancer in her unknown relative; Lung cancer (age of onset: 85) in her sister; Thrombosis in her unknown relative.   Review of Systems See HPI.  Patient has some excessive urination and urinary leakage and some joint pains.  All other review of systems are negative at this time.  Objective:   Physical Exam @BP  126/78   Pulse 68   Ht 5\' 1"  (1.549 m)   Wt 163 lb (73.9 kg)   BMI 30.80 kg/m @  General:  Well-developed, well-nourished and in no acute distress Eyes:  anicteric. ENT:   Mouth and posterior pharynx free of lesions.  Neck:   supple w/o thyromegaly or mass.  Lungs: Clear to auscultation bilaterally. Heart:  S1S2, no rubs, murmurs, gallops. Abdomen:  soft, non-tender, no hepatosplenomegaly, hernia, or mass and BS+.  Rectal:  Jessica Mullins may present  Tiny anal tags Rectocele moderate size Decreased rest/vol squeeze Otherwise normal  Anoscopy mldly  inflamed Gr 2   Lymph:  no cervical or supraclavicular adenopathy. Extremities:   no edema, cyanosis or clubbing Skin   no rash. Neuro:  A&O x 3.  Psych:  appropriate mood and  Affect.   Data Reviewed:  See HPI

## 2018-05-04 ENCOUNTER — Ambulatory Visit: Payer: Medicare PPO | Admitting: Physical Therapy

## 2018-05-04 DIAGNOSIS — M545 Low back pain, unspecified: Secondary | ICD-10-CM

## 2018-05-04 DIAGNOSIS — R2689 Other abnormalities of gait and mobility: Secondary | ICD-10-CM

## 2018-05-04 DIAGNOSIS — G8929 Other chronic pain: Secondary | ICD-10-CM | POA: Diagnosis not present

## 2018-05-04 DIAGNOSIS — R262 Difficulty in walking, not elsewhere classified: Secondary | ICD-10-CM | POA: Diagnosis not present

## 2018-05-04 DIAGNOSIS — R2681 Unsteadiness on feet: Secondary | ICD-10-CM

## 2018-05-04 DIAGNOSIS — R296 Repeated falls: Secondary | ICD-10-CM

## 2018-05-04 NOTE — Therapy (Signed)
[place], [place], [address] Phone: [phone]   Fax:  [phone]  Physical Therapy Treatment  Patient Details  Name: Jessica Mullins MRN: [id number] Date of Birth: October 19, 1930 Referring Provider (PT): Tomi Likens   Encounter Date: 05/04/2018  PT End of Session - 05/04/18 0929    Visit Number  6    Date for PT Re-Evaluation  06/11/18    PT Start Time  0844    PT Stop Time  0929    PT Time Calculation (min)  45 min    Activity Tolerance  Patient tolerated treatment well    Behavior During Therapy  [facility] for tasks assessed/performed       Past Medical History:  Diagnosis Date  . Allergy   . Ankle fracture, left   . Breast cancer ([place]) 1115/16   left   . Bursitis of right shoulder   . Cataract   . Family history of cancer   . Fracture of right wrist   . GERD (gastroesophageal reflux disease)   . Hyperlipidemia   . Hypertension   . Skin cancer 2000   melanoma and basal cell  . Vaso vagal episode    during preparation for colonoscopy    Past Surgical History:  Procedure Laterality Date  . ABDOMINAL HYSTERECTOMY  1988   TAH/BSO--FIBROIDS  . APPENDECTOMY    . BREAST LUMPECTOMY     B/L--FCS  . BREAST LUMPECTOMY WITH RADIOACTIVE SEED AND SENTINEL LYMPH NODE BIOPSY Left 06/11/2015   Procedure: LEFT BREAST LUMPECTOMY WITH RADIOACTIVE SEED AND LEFT SENTINEL LYMPH NODE MAPPING;  Surgeon: Erroll Luna, MD;  Location: [facility];  Service: General;  Laterality: Left;  . CATARACT EXTRACTION  2015  . COLONOSCOPY  2010  . DG  BONE DENSITY ([place] HX)    . EYE SURGERY Bilateral    cataracts  . fiberadenoma Bilateral 1978, 1980  . GANGLION CYST EXCISION     L  hand  . Lipiflow procedure      There were no vitals filed for this visit.  Subjective Assessment - 05/04/18 0844    Subjective  "Iv been okay no new problems"    Currently in Pain?  No/denies         [facility] PT Assessment - 05/04/18  0001      Timed Up and Go Test   Normal TUG (seconds)  11.7                   [facility] Adult PT Treatment/Exercise - 05/04/18 0001      High Level Balance   High Level Balance Activities  Tandem walking   HHA   High Level Balance Comments  Marching. up and over, mini squats all on airex   Contact gaurd     Knee/Hip Exercises: Aerobic   Nustep  L4 x 7 min       Knee/Hip Exercises: Machines for Strengthening   Cybex Knee Extension  2x10, 15lbs    Cybex Knee Flexion  2x10, 20lbs    Cybex Leg Press  3x10, 30 lbs      Knee/Hip Exercises: Standing   Lateral Step Up  2 sets;10 reps;Right;Left;Step Height: 6"    Forward Step Up  2 sets;10 reps;Step Height: 6"    Walking with Sports Cord  20lb lbs forward and sideways               PT Short Term Goals - 04/23/18 1022  PT SHORT TERM GOAL #1   Title  independent with initial HEP    Status  Achieved        PT Long Term Goals - 05/04/18 0935      PT LONG TERM GOAL #3   Title  decresae TUG time to 13 seconds    Baseline  Able to perform TUG in 11.7 seconds    Time  8    Period  Weeks    Status  Achieved            Plan - 05/04/18 0930    Clinical Impression Statement  Pt tolerated progression of weights well today with therapeutic exercises. Pt required rest break x2 due to LE fatigue. Pt demonstrated a lack of confidence with steps but did well once gaining that confidence. Pt demonstrated decreased stance time on right leg with balance exercises. Single leg balance revealed that pt could balance for 2 sec on R LE and 5 sec on Left    PT Frequency  2x / week    PT Duration  8 weeks    PT Treatment/Interventions  ADLs/Self Care Home Management;Gait training;Balance training;Neuromuscular re-education;Therapeutic exercise;Therapeutic activities;Functional mobility training;Stair training;Manual techniques;Patient/family education;Electrical Stimulation;Moist Heat    PT Next Visit Plan  Continue  progressing with strength and balance    Consulted and Agree with Plan of Care  Patient       Patient will benefit from skilled therapeutic intervention in order to improve the following deficits and impairments:  Abnormal gait, Decreased coordination, Difficulty walking, Decreased balance, Pain, Postural dysfunction, Decreased strength, Decreased mobility  Visit Diagnosis: Unsteadiness on feet  Repeated falls  Difficulty in walking, not elsewhere classified  Chronic bilateral low back pain without sciatica  Other abnormalities of gait and mobility     Problem List Patient Active Problem List   Diagnosis Date Noted  . Constipation by delayed colonic transit 12/13/2017  . Hemorrhoids 02/14/2017  . Atrial fibrillation ([place]) 08/10/2015  . Orthostatic hypotension 08/04/2015  . Leukocytosis 07/27/2015  . Genetic testing 07/27/2015  . Family history of cancer   . Cancer of central portion of left female breast ([place]) 06/01/2015  . Bilateral low back pain without sciatica 11/18/2014  . Right shoulder pain 04/10/2014  . Muscle spasm of back 01/13/2014  . Olecranon bursitis of left elbow 08/26/2013  . Posterolateral cervical muscle strain 12/14/2012  . Hyperlipidemia 02/16/2010  . TMJ PAIN 02/16/2010  . SEBORRHEIC KERATOSIS 08/24/2009  . Essential hypertension 10/18/2007  . OSTEOPENIA 07/12/2007  . Melanoma of skin ([place]) 10/17/2006  . BREAST CYST 10/17/2006  . COLONOSCOPY, HX OF 10/17/2006    Howell Rucks, SPTA 05/04/2018, 9:36 AM  [place], [place], [address] Phone: [phone]   Fax:  [phone]  Name: Jessica Mullins MRN: [id number] Date of Birth: [birth year]

## 2018-05-07 ENCOUNTER — Telehealth: Payer: Self-pay | Admitting: *Deleted

## 2018-05-07 DIAGNOSIS — Z0000 Encounter for general adult medical examination without abnormal findings: Secondary | ICD-10-CM

## 2018-05-07 NOTE — Telephone Encounter (Signed)
Copied from [place]. Topic: Referral - Question >> May 07, 2018 11:36 AM Rayann Heman wrote: Reason for CRM: pt called and stated that she would like a referral to get a mammogram. Please advise. CB# [phone]. Pt states that she has an appointment scheduled 05/31/18 at [facility].

## 2018-05-07 NOTE — Telephone Encounter (Signed)
Ok to put order in for mammogram but they usually don't needs referral for screening mammogram

## 2018-05-07 NOTE — Telephone Encounter (Signed)
Spoke with pt. She states she needs her annual mammogram and is scheduled for 05/31/18. [facility] told pt she will need referral. Can PCP place order and check with referral co-ordinator to see if insurance authorization is required? States she was previously seeing Dr Genia Hotter at [facility] but hasn't seen her in 2-3 years.

## 2018-05-08 ENCOUNTER — Encounter: Payer: Self-pay | Admitting: Internal Medicine

## 2018-05-08 NOTE — Telephone Encounter (Signed)
Orders was faxed to [facility]

## 2018-05-08 NOTE — Telephone Encounter (Signed)
Order for mammogram placed. Routed to [facility] to confirm whether or not pt. Needs referral.

## 2018-05-09 NOTE — Telephone Encounter (Signed)
Patient advised referral was faxed. She is scheduled for 05-31-18

## 2018-05-09 NOTE — Telephone Encounter (Signed)
No answer no vm

## 2018-05-14 ENCOUNTER — Other Ambulatory Visit: Payer: Self-pay | Admitting: Family Medicine

## 2018-05-14 DIAGNOSIS — Z853 Personal history of malignant neoplasm of breast: Secondary | ICD-10-CM

## 2018-05-15 ENCOUNTER — Ambulatory Visit: Payer: Medicare PPO | Admitting: Physical Therapy

## 2018-05-15 DIAGNOSIS — R262 Difficulty in walking, not elsewhere classified: Secondary | ICD-10-CM | POA: Diagnosis not present

## 2018-05-15 DIAGNOSIS — R2681 Unsteadiness on feet: Secondary | ICD-10-CM

## 2018-05-15 DIAGNOSIS — R296 Repeated falls: Secondary | ICD-10-CM

## 2018-05-15 DIAGNOSIS — R2689 Other abnormalities of gait and mobility: Secondary | ICD-10-CM | POA: Diagnosis not present

## 2018-05-15 DIAGNOSIS — M545 Low back pain, unspecified: Secondary | ICD-10-CM

## 2018-05-15 DIAGNOSIS — G8929 Other chronic pain: Secondary | ICD-10-CM

## 2018-05-15 NOTE — Therapy (Signed)
[place], [place], [address] Phone: [phone]   Fax:  [phone]  Physical Therapy Treatment  Patient Details  Name: Jessica Mullins MRN: [id number] Date of Birth: February 27, 1931 Referring Provider (PT): Tomi Likens   Encounter Date: 05/15/2018  PT End of Session - 05/15/18 0928    Visit Number  7    Date for PT Re-Evaluation  06/11/18    PT Start Time  0845    PT Stop Time  0927    PT Time Calculation (min)  42 min    Activity Tolerance  Patient tolerated treatment well    Behavior During Therapy  [facility] for tasks assessed/performed       Past Medical History:  Diagnosis Date  . Allergy   . Ankle fracture, left   . Breast cancer ([place]) 1115/16   left   . Bursitis of right shoulder   . Cataract   . Family history of cancer   . Fracture of right wrist   . GERD (gastroesophageal reflux disease)   . Hyperlipidemia   . Hypertension   . Skin cancer 2000   melanoma and basal cell  . Vaso vagal episode    during preparation for colonoscopy    Past Surgical History:  Procedure Laterality Date  . ABDOMINAL HYSTERECTOMY  1988   TAH/BSO--FIBROIDS  . APPENDECTOMY    . BREAST LUMPECTOMY     B/L--FCS  . BREAST LUMPECTOMY WITH RADIOACTIVE SEED AND SENTINEL LYMPH NODE BIOPSY Left 06/11/2015   Procedure: LEFT BREAST LUMPECTOMY WITH RADIOACTIVE SEED AND LEFT SENTINEL LYMPH NODE MAPPING;  Surgeon: Erroll Luna, MD;  Location: [facility];  Service: General;  Laterality: Left;  . CATARACT EXTRACTION  2015  . COLONOSCOPY  2010  . DG  BONE DENSITY ([place] HX)    . EYE SURGERY Bilateral    cataracts  . fiberadenoma Bilateral 1978, 1980  . GANGLION CYST EXCISION     L  hand  . Lipiflow procedure      There were no vitals filed for this visit.  Subjective Assessment - 05/15/18 0850    Subjective  "Im all up and down, good one day and then my balance is off the next"    Currently in Pain?  No/denies                        [facility] Adult PT Treatment/Exercise - 05/15/18 0001      High Level Balance   High Level Balance Activities  Tandem walking    High Level Balance Comments  marching, moving head, catching ball all on airex; kicking and catching, SLS without airex      Knee/Hip Exercises: Aerobic   Recumbent Bike  7 min      Knee/Hip Exercises: Machines for Strengthening   Cybex Knee Extension  2x10, 15lbs    Cybex Knee Flexion  2x10, 25lbs    Cybex Leg Press  3x10, 30 lbs      Knee/Hip Exercises: Standing   Walking with Sports Cord  20lb lbs all directions               PT Short Term Goals - 04/23/18 1022      PT SHORT TERM GOAL #1   Title  independent with initial HEP    Status  Achieved        PT Long Term Goals - 05/04/18 0935      PT LONG  TERM GOAL #3   Title  decresae TUG time to 13 seconds    Baseline  Able to perform TUG in 11.7 seconds    Time  8    Period  Weeks    Status  Achieved            Plan - 05/15/18 4010    Clinical Impression Statement  Pt tolerated progression of exercises well today. Pt demonstrated good balance on airex and with kicking & catching ball. Pt continues to lose balance laterally with tandem walking & requires CGA. Pt tends to have a lack of confidence with tandem & stated that they can not make the foot go where it should. Pt is progressing well towards all therapy goals at this time.     Rehab Potential  Good    PT Frequency  2x / week    PT Duration  8 weeks    PT Treatment/Interventions  ADLs/Self Care Home Management;Gait training;Balance training;Neuromuscular re-education;Therapeutic exercise;Therapeutic activities;Functional mobility training;Stair training;Manual techniques;Patient/family education;Electrical Stimulation;Moist Heat    PT Next Visit Plan  Continue progressing with strength and balance    Consulted and Agree with Plan of Care  Patient       Patient will benefit from skilled  therapeutic intervention in order to improve the following deficits and impairments:  Abnormal gait, Decreased coordination, Difficulty walking, Decreased balance, Pain, Postural dysfunction, Decreased strength, Decreased mobility  Visit Diagnosis: Unsteadiness on feet  Repeated falls  Difficulty in walking, not elsewhere classified  Chronic bilateral low back pain without sciatica  Other abnormalities of gait and mobility     Problem List Patient Active Problem List   Diagnosis Date Noted  . Constipation by delayed colonic transit 12/13/2017  . Hemorrhoids 02/14/2017  . Atrial fibrillation ([place]) 08/10/2015  . Orthostatic hypotension 08/04/2015  . Leukocytosis 07/27/2015  . Genetic testing 07/27/2015  . Family history of cancer   . Cancer of central portion of left female breast ([place]) 06/01/2015  . Bilateral low back pain without sciatica 11/18/2014  . Right shoulder pain 04/10/2014  . Muscle spasm of back 01/13/2014  . Olecranon bursitis of left elbow 08/26/2013  . Posterolateral cervical muscle strain 12/14/2012  . Hyperlipidemia 02/16/2010  . TMJ PAIN 02/16/2010  . SEBORRHEIC KERATOSIS 08/24/2009  . Essential hypertension 10/18/2007  . OSTEOPENIA 07/12/2007  . Melanoma of skin ([place]) 10/17/2006  . BREAST CYST 10/17/2006  . COLONOSCOPY, HX OF 10/17/2006    Howell Rucks, SPTA 05/15/2018, 9:32 AM  [place], [place], [address] Phone: [phone]   Fax:  [phone]  Name: Jessica Mullins MRN: [id number] Date of Birth: 1931-05-29

## 2018-05-18 ENCOUNTER — Ambulatory Visit: Payer: Medicare PPO | Admitting: Physical Therapy

## 2018-05-18 DIAGNOSIS — R2689 Other abnormalities of gait and mobility: Secondary | ICD-10-CM

## 2018-05-18 DIAGNOSIS — M545 Low back pain, unspecified: Secondary | ICD-10-CM

## 2018-05-18 DIAGNOSIS — R2681 Unsteadiness on feet: Secondary | ICD-10-CM

## 2018-05-18 DIAGNOSIS — R262 Difficulty in walking, not elsewhere classified: Secondary | ICD-10-CM

## 2018-05-18 DIAGNOSIS — R296 Repeated falls: Secondary | ICD-10-CM

## 2018-05-18 DIAGNOSIS — G8929 Other chronic pain: Secondary | ICD-10-CM | POA: Diagnosis not present

## 2018-05-18 NOTE — Therapy (Signed)
[place] [address] [place], [place], [address] Phone: [phone]   Fax:  [phone]  Physical Therapy Treatment  Patient Details  Name: Jessica Mullins MRN: [id number] Date of Birth: 1930-08-19 Referring Provider (PT): Tomi Likens   Encounter Date: 05/18/2018  PT End of Session - 05/18/18 1102    Visit Number  8    Date for PT Re-Evaluation  06/11/18    PT Start Time  1324    PT Stop Time  1058    PT Time Calculation (min)  43 min    Activity Tolerance  Patient tolerated treatment well    Behavior During Therapy  [facility] for tasks assessed/performed       Past Medical History:  Diagnosis Date  . Allergy   . Ankle fracture, left   . Breast cancer ([place]) 1115/16   left   . Bursitis of right shoulder   . Cataract   . Family history of cancer   . Fracture of right wrist   . GERD (gastroesophageal reflux disease)   . Hyperlipidemia   . Hypertension   . Skin cancer 2000   melanoma and basal cell  . Vaso vagal episode    during preparation for colonoscopy    Past Surgical History:  Procedure Laterality Date  . ABDOMINAL HYSTERECTOMY  1988   TAH/BSO--FIBROIDS  . APPENDECTOMY    . BREAST LUMPECTOMY     B/L--FCS  . BREAST LUMPECTOMY WITH RADIOACTIVE SEED AND SENTINEL LYMPH NODE BIOPSY Left 06/11/2015   Procedure: LEFT BREAST LUMPECTOMY WITH RADIOACTIVE SEED AND LEFT SENTINEL LYMPH NODE MAPPING;  Surgeon: Erroll Luna, MD;  Location: [place];  Service: General;  Laterality: Left;  . CATARACT EXTRACTION  2015  . COLONOSCOPY  2010  . DG  BONE DENSITY ([place] HX)    . EYE SURGERY Bilateral    cataracts  . fiberadenoma Bilateral 1978, 1980  . GANGLION CYST EXCISION     L  hand  . Lipiflow procedure      There were no vitals filed for this visit.  Subjective Assessment - 05/18/18 1019    Subjective  "Im doing alright, my legs have just felt rubbery lately"    Currently in Pain?  No/denies                        [facility] Adult PT Treatment/Exercise - 05/18/18 0001      Standardized Balance Assessment   Standardized Balance Assessment  Berg Balance Test      Berg Balance Test   Sit to Stand  Able to stand without using hands and stabilize independently    Standing Unsupported  Able to stand safely 2 minutes    Sitting with Back Unsupported but Feet Supported on Floor or Stool  Able to sit safely and securely 2 minutes    Stand to Sit  Sits safely with minimal use of hands    Transfers  Able to transfer safely, minor use of hands    Standing Unsupported with Eyes Closed  Able to stand 10 seconds safely    Standing Ubsupported with Feet Together  Able to place feet together independently and stand 1 minute safely    From Standing, Reach Forward with Outstretched Arm  Can reach confidently >25 cm (10")    From Standing Position, Pick up Object from Floor  Able to pick up shoe safely and easily    From Standing Position, Turn to  Look Behind Over each Shoulder  Looks behind from both sides and weight shifts well    Turn 360 Degrees  Able to turn 360 degrees safely in 4 seconds or less    Standing Unsupported, Alternately Place Feet on Step/Stool  Able to complete >2 steps/needs minimal assist    Standing Unsupported, One Foot in [employer] balance while stepping or standing    Standing on One Leg  Able to lift leg independently and hold equal to or more than 3 seconds    Total Score  47      High Level Balance   High Level Balance Activities  Tandem walking;Negotiating over obstacles      Exercises   Exercises  Knee/Hip      Knee/Hip Exercises: Aerobic   Recumbent Bike  7 min      Knee/Hip Exercises: Standing   Forward Step Up  Both;2 sets;10 reps;Step Height: 6"    Other Standing Knee Exercises  Box taps 3x10 last set with 1# weights               PT Short Term Goals - 04/23/18 1022      PT SHORT TERM GOAL #1   Title  independent with initial HEP     Status  Achieved        PT Long Term Goals - 05/18/18 1107      PT LONG TERM GOAL #2   Title  Patient to improve Berg to 48/56 demonstrating reduced fall risk    Time  8    Period  Weeks    Status  Partially Met            Plan - 05/18/18 1103    Clinical Impression Statement  Pt required CGA with step ups and box taps due unsteadiness & LOB laterally x2. Pt presented with weakness in L hip flexor compared to R as seen with Pt scrapping side of box during box taps. Pt was able to correctly pick the L foot up higher without scrapping when thinking about it. Pt required close supervision with ambulating through obstacles to ensure safety. Pt was hesistant when starting obstacles but became more confident as the exercise progressed. Pt continues to make good progress towards remaining goals at this time.     Rehab Potential  Good    PT Frequency  2x / week    PT Duration  8 weeks    PT Treatment/Interventions  ADLs/Self Care Home Management;Gait training;Balance training;Neuromuscular re-education;Therapeutic exercise;Therapeutic activities;Functional mobility training;Stair training;Manual techniques;Patient/family education;Electrical Stimulation;Moist Heat    PT Next Visit Plan  Continue progressing with strength and balance    Consulted and Agree with Plan of Care  Patient       Patient will benefit from skilled therapeutic intervention in order to improve the following deficits and impairments:  Abnormal gait, Decreased coordination, Difficulty walking, Decreased balance, Pain, Postural dysfunction, Decreased strength, Decreased mobility  Visit Diagnosis: Unsteadiness on feet  Repeated falls  Difficulty in walking, not elsewhere classified  Chronic bilateral low back pain without sciatica  Other abnormalities of gait and mobility     Problem List Patient Active Problem List   Diagnosis Date Noted  . Constipation by delayed colonic transit 12/13/2017  .  Hemorrhoids 02/14/2017  . Atrial fibrillation ([place]) 08/10/2015  . Orthostatic hypotension 08/04/2015  . Leukocytosis 07/27/2015  . Genetic testing 07/27/2015  . Family history of cancer   . Cancer of central portion of left female breast ([place])  06/01/2015  . Bilateral low back pain without sciatica 11/18/2014  . Right shoulder pain 04/10/2014  . Muscle spasm of back 01/13/2014  . Olecranon bursitis of left elbow 08/26/2013  . Posterolateral cervical muscle strain 12/14/2012  . Hyperlipidemia 02/16/2010  . TMJ PAIN 02/16/2010  . SEBORRHEIC KERATOSIS 08/24/2009  . Essential hypertension 10/18/2007  . OSTEOPENIA 07/12/2007  . Melanoma of skin ([place]) 10/17/2006  . BREAST CYST 10/17/2006  . COLONOSCOPY, HX OF 10/17/2006    Howell Rucks, SPTA 05/18/2018, 11:08 AM  [address] [place], [place], [address] Phone: [phone]   Fax:  [phone]  Name: Jessica Mullins MRN: [id number] Date of Birth: 1931/05/16

## 2018-05-22 ENCOUNTER — Ambulatory Visit: Payer: Medicare PPO | Admitting: Physical Therapy

## 2018-05-22 DIAGNOSIS — R2689 Other abnormalities of gait and mobility: Secondary | ICD-10-CM

## 2018-05-22 DIAGNOSIS — G8929 Other chronic pain: Secondary | ICD-10-CM

## 2018-05-22 DIAGNOSIS — M545 Low back pain, unspecified: Secondary | ICD-10-CM

## 2018-05-22 DIAGNOSIS — R262 Difficulty in walking, not elsewhere classified: Secondary | ICD-10-CM

## 2018-05-22 DIAGNOSIS — R2681 Unsteadiness on feet: Secondary | ICD-10-CM | POA: Diagnosis not present

## 2018-05-22 DIAGNOSIS — R296 Repeated falls: Secondary | ICD-10-CM

## 2018-05-22 NOTE — Therapy (Signed)
[address] [place], [place], [address] Phone: [phone]   Fax:  [phone]  Physical Therapy Treatment  Patient Details  Name: Jessica Mullins MRN: [id number] Date of Birth: May 18, 1931 Referring Provider (PT): Tomi Likens   Encounter Date: 05/22/2018  PT End of Session - 05/22/18 1013    Visit Number  9    Date for PT Re-Evaluation  06/11/18    PT Start Time  0930    PT Stop Time  3220    PT Time Calculation (min)  44 min    Activity Tolerance  Patient tolerated treatment well    Behavior During Therapy  [facility] for tasks assessed/performed       Past Medical History:  Diagnosis Date  . Allergy   . Ankle fracture, left   . Breast cancer ([place]) 1115/16   left   . Bursitis of right shoulder   . Cataract   . Family history of cancer   . Fracture of right wrist   . GERD (gastroesophageal reflux disease)   . Hyperlipidemia   . Hypertension   . Skin cancer 2000   melanoma and basal cell  . Vaso vagal episode    during preparation for colonoscopy    Past Surgical History:  Procedure Laterality Date  . ABDOMINAL HYSTERECTOMY  1988   TAH/BSO--FIBROIDS  . APPENDECTOMY    . BREAST LUMPECTOMY     B/L--FCS  . BREAST LUMPECTOMY WITH RADIOACTIVE SEED AND SENTINEL LYMPH NODE BIOPSY Left 06/11/2015   Procedure: LEFT BREAST LUMPECTOMY WITH RADIOACTIVE SEED AND LEFT SENTINEL LYMPH NODE MAPPING;  Surgeon: Erroll Luna, MD;  Location: [place];  Service: General;  Laterality: Left;  . CATARACT EXTRACTION  2015  . COLONOSCOPY  2010  . DG  BONE DENSITY ([place] HX)    . EYE SURGERY Bilateral    cataracts  . fiberadenoma Bilateral 1978, 1980  . GANGLION CYST EXCISION     L  hand  . Lipiflow procedure      There were no vitals filed for this visit.  Subjective Assessment - 05/22/18 0938    Subjective  "Im doing about the same"    Currently in Pain?  No/denies                       [facility]  Adult PT Treatment/Exercise - 05/22/18 0001      High Level Balance   High Level Balance Activities  Negotitating around obstacles;Negotiating over obstacles    High Level Balance Comments  SLS bilateral x2       Knee/Hip Exercises: Aerobic   Nustep  L4 x 7 min       Knee/Hip Exercises: Machines for Strengthening   Cybex Knee Extension  3x10, 15lbs    Cybex Knee Flexion  3x10, 25lbs    Cybex Leg Press  3x10, 40lbs      Knee/Hip Exercises: Standing   Lateral Step Up  2 sets;10 reps;Right;Left;Step Height: 6"   1# ankle weight   Forward Step Up  Both;2 sets;10 reps;Step Height: 6"   1 lb ankle weights   Other Standing Knee Exercises  Box taps 3x10 last set with 1# weights, 6 in box               PT Short Term Goals - 04/23/18 1022      PT SHORT TERM GOAL #1   Title  independent with initial HEP    Status  Achieved        PT Long Term Goals - 05/18/18 1107      PT LONG TERM GOAL #2   Title  Patient to improve Berg to 48/56 demonstrating reduced fall risk    Time  8    Period  Weeks    Status  Partially Met            Plan - 05/22/18 1014    Clinical Impression Statement  Pt tolerated progression of exercises well today. Pt required close supervision with box taps and step ups to ensure safety due to some unsteadiness. Pt required CGA with negotiating obstacles due to LOB laterally x2 to the left. Pt was able to demonstrate SLS on the L LE for 20 sec but struggle to find balance on the R LE. Once balance was found pt was able to do one SLS for 14 sec on R LE. Pt continues to have some lack of confidence with balance activities but does well once pt does the exercise once and builds confidence.     Rehab Potential  Good    PT Frequency  2x / week    PT Duration  8 weeks    PT Treatment/Interventions  ADLs/Self Care Home Management;Gait training;Balance training;Neuromuscular re-education;Therapeutic exercise;Therapeutic activities;Functional mobility  training;Stair training;Manual techniques;Patient/family education;Electrical Stimulation;Moist Heat    PT Next Visit Plan  Focus on functional balance and continue progressing strength exercises    Consulted and Agree with Plan of Care  Patient       Patient will benefit from skilled therapeutic intervention in order to improve the following deficits and impairments:  Abnormal gait, Decreased coordination, Difficulty walking, Decreased balance, Pain, Postural dysfunction, Decreased strength, Decreased mobility  Visit Diagnosis: Unsteadiness on feet  Repeated falls  Difficulty in walking, not elsewhere classified  Chronic bilateral low back pain without sciatica  Other abnormalities of gait and mobility     Problem List Patient Active Problem List   Diagnosis Date Noted  . Constipation by delayed colonic transit 12/13/2017  . Hemorrhoids 02/14/2017  . Atrial fibrillation ([place]) 08/10/2015  . Orthostatic hypotension 08/04/2015  . Leukocytosis 07/27/2015  . Genetic testing 07/27/2015  . Family history of cancer   . Cancer of central portion of left female breast ([place]) 06/01/2015  . Bilateral low back pain without sciatica 11/18/2014  . Right shoulder pain 04/10/2014  . Muscle spasm of back 01/13/2014  . Olecranon bursitis of left elbow 08/26/2013  . Posterolateral cervical muscle strain 12/14/2012  . Hyperlipidemia 02/16/2010  . TMJ PAIN 02/16/2010  . SEBORRHEIC KERATOSIS 08/24/2009  . Essential hypertension 10/18/2007  . OSTEOPENIA 07/12/2007  . Melanoma of skin ([place]) 10/17/2006  . BREAST CYST 10/17/2006  . COLONOSCOPY, HX OF 10/17/2006    Howell Rucks, SPTA 05/22/2018, 10:20 AM  [place] [address] [place], [place], [address] Phone: [phone]   Fax:  [phone]  Name: Lubertha L Beiser MRN: [id number] Date of Birth: [birth year]

## 2018-05-25 ENCOUNTER — Other Ambulatory Visit: Payer: Self-pay | Admitting: Family Medicine

## 2018-05-25 DIAGNOSIS — E785 Hyperlipidemia, unspecified: Secondary | ICD-10-CM

## 2018-05-29 ENCOUNTER — Ambulatory Visit: Payer: Medicare PPO | Attending: Neurology | Admitting: Physical Therapy

## 2018-05-29 DIAGNOSIS — G8929 Other chronic pain: Secondary | ICD-10-CM | POA: Diagnosis not present

## 2018-05-29 DIAGNOSIS — R2689 Other abnormalities of gait and mobility: Secondary | ICD-10-CM | POA: Diagnosis not present

## 2018-05-29 DIAGNOSIS — M545 Low back pain, unspecified: Secondary | ICD-10-CM

## 2018-05-29 DIAGNOSIS — R2681 Unsteadiness on feet: Secondary | ICD-10-CM | POA: Diagnosis not present

## 2018-05-29 DIAGNOSIS — R262 Difficulty in walking, not elsewhere classified: Secondary | ICD-10-CM | POA: Insufficient documentation

## 2018-05-29 DIAGNOSIS — R296 Repeated falls: Secondary | ICD-10-CM | POA: Diagnosis not present

## 2018-05-29 NOTE — Therapy (Addendum)
[address] [place], [place], [address] Phone: [phone]   Fax:  [phone] Progress Note Reporting Period 04/11/2018 to 05/29/2018 for the first 10 visits  See note below for Objective Data and Assessment of Progress/Goals.      Physical Therapy Treatment  Patient Details  Name: Jessica Mullins MRN: [id number] Date of Birth: [birth year] Referring Provider (PT): Tomi Likens   Encounter Date: 05/29/2018  PT End of Session - 05/29/18 7026    Visit Number  10    Date for PT Re-Evaluation  06/11/18    PT Start Time  1140    PT Stop Time  1223    PT Time Calculation (min)  43 min    Activity Tolerance  Patient tolerated treatment well    Behavior During Therapy  [facility] for tasks assessed/performed       Past Medical History:  Diagnosis Date  . Allergy   . Ankle fracture, left   . Breast cancer ([place]) 1115/16   left   . Bursitis of right shoulder   . Cataract   . Family history of cancer   . Fracture of right wrist   . GERD (gastroesophageal reflux disease)   . Hyperlipidemia   . Hypertension   . Skin cancer 2000   melanoma and basal cell  . Vaso vagal episode    during preparation for colonoscopy    Past Surgical History:  Procedure Laterality Date  . ABDOMINAL HYSTERECTOMY  1988   TAH/BSO--FIBROIDS  . APPENDECTOMY    . BREAST LUMPECTOMY     B/L--FCS  . BREAST LUMPECTOMY WITH RADIOACTIVE SEED AND SENTINEL LYMPH NODE BIOPSY Left 06/11/2015   Procedure: LEFT BREAST LUMPECTOMY WITH RADIOACTIVE SEED AND LEFT SENTINEL LYMPH NODE MAPPING;  Surgeon: Erroll Luna, MD;  Location: [place];  Service: General;  Laterality: Left;  . CATARACT EXTRACTION  2015  . COLONOSCOPY  2010  . DG  BONE DENSITY ([place] HX)    . EYE SURGERY Bilateral    cataracts  . fiberadenoma Bilateral 1978, 1980  . GANGLION CYST EXCISION     L  hand  . Lipiflow procedure      There were no vitals filed for this  visit.  Subjective Assessment - 05/29/18 1144    Subjective  "Im doing fine, my legs just feel like rubber"    Currently in Pain?  No/denies                       [facility] Adult PT Treatment/Exercise - 05/29/18 0001      High Level Balance   High Level Balance Comments  SLS bilateral x2; Tandem balance x3      Knee/Hip Exercises: Aerobic   Nustep  L4 x 7 min       Knee/Hip Exercises: Machines for Strengthening   Cybex Knee Extension  3x10 20#    Cybex Knee Flexion  2x10 35#    Cybex Leg Press  3x10 40#      Knee/Hip Exercises: Standing   Walking with Sports Cord  20lb lbs all directions    Other Standing Knee Exercises  Box taps 3x10 last set with 6 in box               PT Short Term Goals - 04/23/18 1022      PT SHORT TERM GOAL #1   Title  independent with initial HEP    Status  Achieved  PT Long Term Goals - 05/18/18 1107      PT LONG TERM GOAL #2   Title  Patient to improve Berg to 48/56 demonstrating reduced fall risk    Time  8    Period  Weeks    Status  Partially Met            Plan - 05/29/18 1223    Clinical Impression Statement  Pt demonstrated increased LE strength in todays session as seen by increased weight and reps this session as compared to last session. Pt required cose supervision with cable walks to ensure safety due to patient being inconsistant with controlling ecentric phase. Patient required close supervision with all balance activities due to patient LOB x2 while balancing on R LE. Pt demonstrated good balance on L LE but required one finger on counter for R. Pt also has trouble with box taps on L most likely due to decreased balance on R LE. Pt continues to progress appropriatly towards remaini ng therapy goals at this time.     Rehab Potential  Good    PT Frequency  2x / week    PT Duration  8 weeks    PT Treatment/Interventions  ADLs/Self Care Home Management;Gait training;Balance training;Neuromuscular  re-education;Therapeutic exercise;Therapeutic activities;Functional mobility training;Stair training;Manual techniques;Patient/family education;Electrical Stimulation;Moist Heat    PT Next Visit Plan  Focus on functional balance and continue progressing strength exercises    Consulted and Agree with Plan of Care  Patient       Patient will benefit from skilled therapeutic intervention in order to improve the following deficits and impairments:  Abnormal gait, Decreased coordination, Difficulty walking, Decreased balance, Pain, Postural dysfunction, Decreased strength, Decreased mobility  Visit Diagnosis: Unsteadiness on feet  Repeated falls  Difficulty in walking, not elsewhere classified  Chronic bilateral low back pain without sciatica  Other abnormalities of gait and mobility     Problem List Patient Active Problem List   Diagnosis Date Noted  . Constipation by delayed colonic transit 12/13/2017  . Hemorrhoids 02/14/2017  . Atrial fibrillation ([place]) 08/10/2015  . Orthostatic hypotension 08/04/2015  . Leukocytosis 07/27/2015  . Genetic testing 07/27/2015  . Family history of cancer   . Cancer of central portion of left female breast ([place]) 06/01/2015  . Bilateral low back pain without sciatica 11/18/2014  . Right shoulder pain 04/10/2014  . Muscle spasm of back 01/13/2014  . Olecranon bursitis of left elbow 08/26/2013  . Posterolateral cervical muscle strain 12/14/2012  . Hyperlipidemia 02/16/2010  . TMJ PAIN 02/16/2010  . SEBORRHEIC KERATOSIS 08/24/2009  . Essential hypertension 10/18/2007  . OSTEOPENIA 07/12/2007  . Melanoma of skin ([place]) 10/17/2006  . BREAST CYST 10/17/2006  . COLONOSCOPY, HX OF 10/17/2006    Howell Rucks, SPTA 05/29/2018, 12:28 PM  [place] [address] [place], [place], [address] Phone: [phone]   Fax:  [phone]  Name: Jessica Mullins MRN: [id number] Date of Birth:  Oct 23, 1930

## 2018-05-31 ENCOUNTER — Encounter: Payer: Self-pay | Admitting: Family Medicine

## 2018-05-31 DIAGNOSIS — N6322 Unspecified lump in the left breast, upper inner quadrant: Secondary | ICD-10-CM | POA: Diagnosis not present

## 2018-05-31 DIAGNOSIS — Z853 Personal history of malignant neoplasm of breast: Secondary | ICD-10-CM | POA: Diagnosis not present

## 2018-05-31 DIAGNOSIS — N6320 Unspecified lump in the left breast, unspecified quadrant: Secondary | ICD-10-CM | POA: Diagnosis not present

## 2018-05-31 DIAGNOSIS — Z0000 Encounter for general adult medical examination without abnormal findings: Secondary | ICD-10-CM | POA: Diagnosis not present

## 2018-05-31 DIAGNOSIS — N6324 Unspecified lump in the left breast, lower inner quadrant: Secondary | ICD-10-CM | POA: Diagnosis not present

## 2018-05-31 LAB — HM MAMMOGRAPHY

## 2018-06-01 ENCOUNTER — Ambulatory Visit: Payer: Medicare PPO | Admitting: Physical Therapy

## 2018-06-01 DIAGNOSIS — R2689 Other abnormalities of gait and mobility: Secondary | ICD-10-CM | POA: Diagnosis not present

## 2018-06-01 DIAGNOSIS — G8929 Other chronic pain: Secondary | ICD-10-CM | POA: Diagnosis not present

## 2018-06-01 DIAGNOSIS — R2681 Unsteadiness on feet: Secondary | ICD-10-CM

## 2018-06-01 DIAGNOSIS — M545 Low back pain, unspecified: Secondary | ICD-10-CM

## 2018-06-01 DIAGNOSIS — R296 Repeated falls: Secondary | ICD-10-CM | POA: Diagnosis not present

## 2018-06-01 DIAGNOSIS — R262 Difficulty in walking, not elsewhere classified: Secondary | ICD-10-CM | POA: Diagnosis not present

## 2018-06-01 NOTE — Therapy (Signed)
[place] [address] [place], [place], [address] Phone: [phone]   Fax:  [phone]  Physical Therapy Treatment  Patient Details  Name: Jessica Mullins MRN: [id number] Date of Birth: 02-05-1931 Referring Provider (PT): Tomi Likens   Encounter Date: 06/01/2018  PT End of Session - 06/01/18 1101    Visit Number  11    Date for PT Re-Evaluation  06/11/18    PT Start Time  1324    PT Stop Time  1100    PT Time Calculation (min)  45 min    Activity Tolerance  Patient tolerated treatment well    Behavior During Therapy  [facility] for tasks assessed/performed       Past Medical History:  Diagnosis Date  . Allergy   . Ankle fracture, left   . Breast cancer ([place]) 1115/16   left   . Bursitis of right shoulder   . Cataract   . Family history of cancer   . Fracture of right wrist   . GERD (gastroesophageal reflux disease)   . Hyperlipidemia   . Hypertension   . Skin cancer 2000   melanoma and basal cell  . Vaso vagal episode    during preparation for colonoscopy    Past Surgical History:  Procedure Laterality Date  . ABDOMINAL HYSTERECTOMY  1988   TAH/BSO--FIBROIDS  . APPENDECTOMY    . BREAST LUMPECTOMY     B/L--FCS  . BREAST LUMPECTOMY WITH RADIOACTIVE SEED AND SENTINEL LYMPH NODE BIOPSY Left 06/11/2015   Procedure: LEFT BREAST LUMPECTOMY WITH RADIOACTIVE SEED AND LEFT SENTINEL LYMPH NODE MAPPING;  Surgeon: Erroll Luna, MD;  Location: [place];  Service: General;  Laterality: Left;  . CATARACT EXTRACTION  2015  . COLONOSCOPY  2010  . DG  BONE DENSITY ([place] HX)    . EYE SURGERY Bilateral    cataracts  . fiberadenoma Bilateral 1978, 1980  . GANGLION CYST EXCISION     L  hand  . Lipiflow procedure      There were no vitals filed for this visit.  Subjective Assessment - 06/01/18 1019    Subjective  "Im still living, my leg is not hurting as much"    Currently in Pain?  No/denies                        [facility] Adult PT Treatment/Exercise - 06/01/18 0001      High Level Balance   High Level Balance Activities  Tandem walking;Negotiating over obstacles   half semi tandem   High Level Balance Comments  SLS bilateral x3, Tandem balance x3      Knee/Hip Exercises: Aerobic   Nustep  L5 x 7 min       Knee/Hip Exercises: Machines for Strengthening   Cybex Knee Extension  3x10 20#    Cybex Knee Flexion  2x10 35#      Knee/Hip Exercises: Standing   Forward Step Up  Both;2 sets;10 reps;Step Height: 6"    Walking with Sports Cord  20lb lbs all directions    Other Standing Knee Exercises  Box taps 3x10 last set with 6 in box               PT Short Term Goals - 04/23/18 1022      PT SHORT TERM GOAL #1   Title  independent with initial HEP    Status  Achieved        PT  Long Term Goals - 05/18/18 1107      PT LONG TERM GOAL #2   Title  Patient to improve Berg to 48/56 demonstrating reduced fall risk    Time  8    Period  Weeks    Status  Partially Met            Plan - 06/01/18 1102    Clinical Impression Statement  Session today focused on balance with some strengthening added in. Pt very hesitant with step ups even though she knows she can do it. Pt required close supervision with all balance activities to ensure safety. Pt had LOBx2 laterally with tandem walking and LOBx1 laterally while negotiating obstacles. Pt demonstrated improved control with sports cord walking this session as compared to last. Pt demonstrated increased difficulty with SL balance on R as compared to L.     PT Treatment/Interventions  ADLs/Self Care Home Management;Gait training;Balance training;Neuromuscular re-education;Therapeutic exercise;Therapeutic activities;Functional mobility training;Stair training;Manual techniques;Patient/family education;Electrical Stimulation;Moist Heat    PT Next Visit Plan  Take pt outside to ambulate on different surfaces, hills,  curbs weather premitting    Consulted and Agree with Plan of Care  Patient       Patient will benefit from skilled therapeutic intervention in order to improve the following deficits and impairments:     Visit Diagnosis: Unsteadiness on feet  Repeated falls  Difficulty in walking, not elsewhere classified  Chronic bilateral low back pain without sciatica  Other abnormalities of gait and mobility     Problem List Patient Active Problem List   Diagnosis Date Noted  . Constipation by delayed colonic transit 12/13/2017  . Hemorrhoids 02/14/2017  . Atrial fibrillation ([place]) 08/10/2015  . Orthostatic hypotension 08/04/2015  . Leukocytosis 07/27/2015  . Genetic testing 07/27/2015  . Family history of cancer   . Cancer of central portion of left female breast ([place]) 06/01/2015  . Bilateral low back pain without sciatica 11/18/2014  . Right shoulder pain 04/10/2014  . Muscle spasm of back 01/13/2014  . Olecranon bursitis of left elbow 08/26/2013  . Posterolateral cervical muscle strain 12/14/2012  . Hyperlipidemia 02/16/2010  . TMJ PAIN 02/16/2010  . SEBORRHEIC KERATOSIS 08/24/2009  . Essential hypertension 10/18/2007  . OSTEOPENIA 07/12/2007  . Melanoma of skin ([place]) 10/17/2006  . BREAST CYST 10/17/2006  . COLONOSCOPY, HX OF 10/17/2006    Howell Rucks, SPTA 06/01/2018, 11:06 AM  [address] [place], [place], [address] Phone: [phone]   Fax:  [phone]  Name: Jessica Mullins MRN: [id number] Date of Birth: [birth year]

## 2018-06-06 ENCOUNTER — Other Ambulatory Visit (HOSPITAL_COMMUNITY): Payer: Self-pay | Admitting: Internal Medicine

## 2018-06-06 ENCOUNTER — Ambulatory Visit: Payer: Medicare PPO | Admitting: Physical Therapy

## 2018-06-06 DIAGNOSIS — R296 Repeated falls: Secondary | ICD-10-CM | POA: Diagnosis not present

## 2018-06-06 DIAGNOSIS — R2681 Unsteadiness on feet: Secondary | ICD-10-CM | POA: Diagnosis not present

## 2018-06-06 DIAGNOSIS — G8929 Other chronic pain: Secondary | ICD-10-CM | POA: Diagnosis not present

## 2018-06-06 DIAGNOSIS — R262 Difficulty in walking, not elsewhere classified: Secondary | ICD-10-CM

## 2018-06-06 DIAGNOSIS — M545 Low back pain: Secondary | ICD-10-CM | POA: Diagnosis not present

## 2018-06-06 DIAGNOSIS — R2689 Other abnormalities of gait and mobility: Secondary | ICD-10-CM | POA: Diagnosis not present

## 2018-06-06 NOTE — Therapy (Signed)
[place], [place], [address] Phone: [phone]   Fax:  [phone]  Physical Therapy Treatment  Patient Details  Name: Jessica Mullins MRN: [id number] Date of Birth: 21-Aug-1930 Referring Provider (PT): Tomi Likens   Encounter Date: 06/06/2018  PT End of Session - 06/06/18 1220    Visit Number  12    Date for PT Re-Evaluation  06/11/18    PT Start Time  6962    PT Stop Time  1220    PT Time Calculation (min)  45 min       Past Medical History:  Diagnosis Date  . Allergy   . Ankle fracture, left   . Breast cancer ([place]) 1115/16   left   . Bursitis of right shoulder   . Cataract   . Family history of cancer   . Fracture of right wrist   . GERD (gastroesophageal reflux disease)   . Hyperlipidemia   . Hypertension   . Skin cancer 2000   melanoma and basal cell  . Vaso vagal episode    during preparation for colonoscopy    Past Surgical History:  Procedure Laterality Date  . ABDOMINAL HYSTERECTOMY  1988   TAH/BSO--FIBROIDS  . APPENDECTOMY    . BREAST LUMPECTOMY     B/L--FCS  . BREAST LUMPECTOMY WITH RADIOACTIVE SEED AND SENTINEL LYMPH NODE BIOPSY Left 06/11/2015   Procedure: LEFT BREAST LUMPECTOMY WITH RADIOACTIVE SEED AND LEFT SENTINEL LYMPH NODE MAPPING;  Surgeon: Erroll Luna, MD;  Location: [facility];  Service: General;  Laterality: Left;  . CATARACT EXTRACTION  2015  . COLONOSCOPY  2010  . DG  BONE DENSITY ([place] [facility])    . EYE SURGERY Bilateral    cataracts  . fiberadenoma Bilateral 1978, 1980  . GANGLION CYST EXCISION     L  hand  . Lipiflow procedure      There were no vitals filed for this visit.  Subjective Assessment - 06/06/18 1144    Subjective  doing pretty good    Currently in Pain?  No/denies                       [facility] Adult PT Treatment/Exercise - 06/06/18 0001      Ambulation/Gait   Gait Comments  amb outside, uneven terrain and up and down  curbs-hesitant with curbs anbd fearful going down   cued to get to edge of curn when going down- she did better     [employer]   Standing Unsupported, Alternately Place Feet on Step/Stool  Able to complete 4 steps without aid or supervision    Standing Unsupported, One Foot in Front  Able to take small step independently and hold 30 seconds    Standing on One Leg  Able to lift leg independently and hold equal to or more than 3 seconds      High Level Balance   High Level Balance Activities  Side stepping;Marching forwards;Backward walking   HHA with red tband on LE     Exercises   Exercises  Knee/Hip      Knee/Hip Exercises: Aerobic   Nustep  L5 x 6 min    LE only     Knee/Hip Exercises: Machines for Strengthening   Cybex Knee Extension  3x10 20#    Cybex Knee Flexion  25# 2 sets 15   tried 35# but cramping HS     Knee/Hip Exercises: Standing  Step Down  Both;Hand Hold: 0;Hand Hold: 1;Hand Hold: 2;Step Height: 4";5 reps;4 sets   VARIATIONS WITH Hands   Other Standing Knee Exercises  6 inch alt box tap 20 reps 2 sets   LOB 3 times, difficulty more with left LE   Other Standing Knee Exercises  STS with ball toss 2 sets 10               PT Short Term Goals - 04/23/18 1022      PT SHORT TERM GOAL #1   Title  independent with initial HEP    Status  Achieved        PT Long Term Goals - 05/18/18 1107      PT LONG TERM GOAL #2   Title  Patient to improve Berg to 48/56 demonstrating reduced fall risk    Time  8    Period  Weeks    Status  Partially Met            Plan - 06/06/18 1220    Clinical Impression Statement  performed last 3 test on BERG out of possible 12 pt scored 3 2 weeks ago and today scored 6. balance is improving bit still difficult with SLS activities including steping/curbs. overall improving    PT Treatment/Interventions  ADLs/Self Care Home Management;Gait training;Balance training;Neuromuscular re-education;Therapeutic  exercise;Therapeutic activities;Functional mobility training;Stair training;Manual techniques;Patient/family education;Electrical Stimulation;Moist Heat    PT Next Visit Plan  2 visits next week and D/C       Patient will benefit from skilled therapeutic intervention in order to improve the following deficits and impairments:  Abnormal gait, Decreased coordination, Difficulty walking, Decreased balance, Pain, Postural dysfunction, Decreased strength, Decreased mobility  Visit Diagnosis: Unsteadiness on feet  Repeated falls  Difficulty in walking, not elsewhere classified     Problem List Patient Active Problem List   Diagnosis Date Noted  . Constipation by delayed colonic transit 12/13/2017  . Hemorrhoids 02/14/2017  . Atrial fibrillation ([place]) 08/10/2015  . Orthostatic hypotension 08/04/2015  . Leukocytosis 07/27/2015  . Genetic testing 07/27/2015  . Family history of cancer   . Cancer of central portion of left female breast ([place]) 06/01/2015  . Bilateral low back pain without sciatica 11/18/2014  . Right shoulder pain 04/10/2014  . Muscle spasm of back 01/13/2014  . Olecranon bursitis of left elbow 08/26/2013  . Posterolateral cervical muscle strain 12/14/2012  . Hyperlipidemia 02/16/2010  . TMJ PAIN 02/16/2010  . SEBORRHEIC KERATOSIS 08/24/2009  . Essential hypertension 10/18/2007  . OSTEOPENIA 07/12/2007  . Melanoma of skin ([place]) 10/17/2006  . BREAST CYST 10/17/2006  . COLONOSCOPY, HX OF 10/17/2006    PAYSEUR,ANGIE PTA 06/06/2018, 12:22 PM  [place], [place], [address] Phone: [phone]   Fax:  [phone]  Name: Jessica Mullins MRN: [id number] Date of Birth: 01-04-1931

## 2018-06-12 ENCOUNTER — Other Ambulatory Visit (HOSPITAL_COMMUNITY): Payer: Self-pay

## 2018-06-12 ENCOUNTER — Ambulatory Visit: Payer: Medicare PPO | Admitting: Physical Therapy

## 2018-06-12 ENCOUNTER — Encounter: Payer: Medicare PPO | Admitting: Physical Therapy

## 2018-06-12 MED ORDER — FLECAINIDE ACETATE 50 MG PO TABS: ORAL_TABLET | ORAL | 1 refills | Status: DC

## 2018-06-14 ENCOUNTER — Encounter: Payer: Medicare PPO | Admitting: Physical Therapy

## 2018-06-14 ENCOUNTER — Ambulatory Visit: Payer: Medicare PPO | Admitting: Physical Therapy

## 2018-06-14 DIAGNOSIS — R2681 Unsteadiness on feet: Secondary | ICD-10-CM | POA: Diagnosis not present

## 2018-06-14 DIAGNOSIS — M545 Low back pain: Secondary | ICD-10-CM | POA: Diagnosis not present

## 2018-06-14 DIAGNOSIS — R2689 Other abnormalities of gait and mobility: Secondary | ICD-10-CM | POA: Diagnosis not present

## 2018-06-14 DIAGNOSIS — R262 Difficulty in walking, not elsewhere classified: Secondary | ICD-10-CM

## 2018-06-14 DIAGNOSIS — G8929 Other chronic pain: Secondary | ICD-10-CM | POA: Diagnosis not present

## 2018-06-14 DIAGNOSIS — R296 Repeated falls: Secondary | ICD-10-CM | POA: Diagnosis not present

## 2018-06-14 NOTE — Therapy (Signed)
[place], [place], [address] Phone: [phone]   Fax:  [phone]  Physical Therapy Treatment  Patient Details  Name: Jessica Mullins MRN: [id number] Date of Birth: [birth year] Referring Provider (PT): Tomi Likens   Encounter Date: 06/14/2018  PT End of Session - 06/14/18 0938    Visit Number  13    PT Start Time  0930    PT Stop Time  1010    PT Time Calculation (min)  40 min       Past Medical History:  Diagnosis Date  . Allergy   . Ankle fracture, left   . Breast cancer ([place]) 1115/16   left   . Bursitis of right shoulder   . Cataract   . Family history of cancer   . Fracture of right wrist   . GERD (gastroesophageal reflux disease)   . Hyperlipidemia   . Hypertension   . Skin cancer 2000   melanoma and basal cell  . Vaso vagal episode    during preparation for colonoscopy    Past Surgical History:  Procedure Laterality Date  . ABDOMINAL HYSTERECTOMY  1988   TAH/BSO--FIBROIDS  . APPENDECTOMY    . BREAST LUMPECTOMY     B/L--FCS  . BREAST LUMPECTOMY WITH RADIOACTIVE SEED AND SENTINEL LYMPH NODE BIOPSY Left 06/11/2015   Procedure: LEFT BREAST LUMPECTOMY WITH RADIOACTIVE SEED AND LEFT SENTINEL LYMPH NODE MAPPING;  Surgeon: Erroll Luna, MD;  Location: [facility];  Service: General;  Laterality: Left;  . CATARACT EXTRACTION  2015  . COLONOSCOPY  2010  . DG  BONE DENSITY ([place])    . EYE SURGERY Bilateral    cataracts  . fiberadenoma Bilateral 1978, 1980  . GANGLION CYST EXCISION     L  hand  . Lipiflow procedure      There were no vitals filed for this visit.  Subjective Assessment - 06/14/18 0931    Subjective  weird dizzy sensation and legs won't do what I want. I think legs need more strengthening. pt verb doing HEP and working out with TV ex prog daily    Currently in Pain?  No/denies         [facility] PT Assessment - 06/14/18 0001      Strength   Overall Strength  Comments  Strength of the LE's 4+/5   grossly tested in sitting                  [facility] Adult PT Treatment/Exercise - 06/14/18 0001      Knee/Hip Exercises: Aerobic   Recumbent Bike  6 min    Nustep  L5 x 6 min       Knee/Hip Exercises: Machines for Strengthening   Cybex Knee Extension  3x10 20#    Cybex Knee Flexion  25# 2 sets 15    Cybex Leg Press  20# 3 sets 10   calf raises 20# 3 sets 10     Knee/Hip Exercises: Standing   Other Standing Knee Exercises  red tband hip 3 way on airex 15 reps each             PT Education - 06/14/18 0946    Education Details  reviewed HEP for home after D/C    Person(s) Educated  Patient    Methods  Explanation    Comprehension  Verbalized understanding       PT Short Term Goals - 04/23/18 1022  PT SHORT TERM GOAL #1   Title  independent with initial HEP    Status  Achieved        PT Long Term Goals - 06/14/18 0933      PT LONG TERM GOAL #2   Title  Patient to improve Berg to 48/56 demonstrating reduced fall risk    Status  Achieved            Plan - 06/14/18 1324    Clinical Impression Statement  all goals met. MMT LE 4+/5. pt frustrated with "disconnect with legs" pt agrees to D/C and work on [place] will f/u with MD if symptoms get worse or change    PT Next Visit Plan  D/C       Patient will benefit from skilled therapeutic intervention in order to improve the following deficits and impairments:  Abnormal gait, Decreased coordination, Difficulty walking, Decreased balance, Pain, Postural dysfunction, Decreased strength, Decreased mobility  Visit Diagnosis: Unsteadiness on feet  Difficulty in walking, not elsewhere classified     Problem List Patient Active Problem List   Diagnosis Date Noted  . Constipation by delayed colonic transit 12/13/2017  . Hemorrhoids 02/14/2017  . Atrial fibrillation ([place]) 08/10/2015  . Orthostatic hypotension 08/04/2015  . Leukocytosis 07/27/2015  . Genetic  testing 07/27/2015  . Family history of cancer   . Cancer of central portion of left female breast ([place]) 06/01/2015  . Bilateral low back pain without sciatica 11/18/2014  . Right shoulder pain 04/10/2014  . Muscle spasm of back 01/13/2014  . Olecranon bursitis of left elbow 08/26/2013  . Posterolateral cervical muscle strain 12/14/2012  . Hyperlipidemia 02/16/2010  . TMJ PAIN 02/16/2010  . SEBORRHEIC KERATOSIS 08/24/2009  . Essential hypertension 10/18/2007  . OSTEOPENIA 07/12/2007  . Melanoma of skin ([place]) 10/17/2006  . BREAST CYST 10/17/2006  . COLONOSCOPY, HX OF 10/17/2006   PHYSICAL THERAPY DISCHARGE SUMMARY   Plan: Patient agrees to discharge.  Patient goals were met. Patient is being discharged due to meeting the stated rehab goals.  ?????      PAYSEUR,ANGIE PTA 06/14/2018, 9:46 AM  [facility] [place], [place], [address] Phone: [phone]   Fax:  [phone]  Name: Jessica Mullins MRN: [id number] Date of Birth: 1930/10/19

## 2018-06-26 ENCOUNTER — Encounter: Payer: Self-pay | Admitting: *Deleted

## 2018-07-18 NOTE — Progress Notes (Signed)
Patient Care Team: Carollee Herter, Alferd Apa, DO as PCP - General Nicholas Lose, MD as Consulting Physician (Hematology and Oncology) Allyn Kenner, MD as Consulting Physician (Dermatology) Monna Fam, MD as Consulting Physician (Ophthalmology)  DIAGNOSIS:    ICD-10-CM   1. Malignant neoplasm of central portion of left breast in female, estrogen receptor negative ([place]) C50.112    Z17.1     SUMMARY OF ONCOLOGIC HISTORY:   Cancer of central portion of left female breast ([place])   05/04/2015 Mammogram     left breast distortion , breast density category A , 7 mm by ultrasound at 3:00 position middle depth    05/12/2015 Initial Diagnosis     left breast biopsy: invasive ductal cancer with DCIS , ER 0%, PR 0%, Ki-67 5%, HER-2 negative ratio 0.83    06/11/2015 Surgery    Left lumpectomy ([place]): DCIS, left additional margin: IDC 1.3 cm + DCIS 1/4 LN positive, grade 2, margins negative, LVI present, ER 0%, PR 0%, HER-2 negative ratio 0.60, Ki-67 5%, T1cN1a stage II a, Mammaprint high risk luminal B    07/20/2015 - 09/21/2015 Chemotherapy    Taxotere and Cytoxan adjuvant chemotherapy  4 cycles    07/24/2015 Procedure    Genetic testing: Negative [employer] mutation panel    01/21/2016 - 02/17/2016 Radiation Therapy    Adjuvant radiation therapy Lisbeth Renshaw). Left breast: 42.5 Gy in 17 fractions. Left breast boost: 7.5 Gy in 3 fractions.      CHIEF COMPLIANT: Surveillance of breast cancer  INTERVAL HISTORY: Jessica Mullins is a 85 y.o. with above-mentioned history of triple negative left breast cancer who underwent left lumpectomy followed by adjuvant chemotherapy and radiation. She is currently on surveillance. I last saw the patient one year ago. Her most recent mammogram was 05/31/18 and showed the presence of oil cysts in her left breast. She presents to the clinic alone today. She reports she has had balance issues recently for which she saw a neurologist and physical therapy and it  has not improved. She will get another mammogram in 09/2018. She reviewed her medication list with me.   REVIEW OF SYSTEMS:   Constitutional: Denies fevers, chills or abnormal weight loss Eyes: Denies blurriness of vision Ears, nose, mouth, throat, and face: Denies mucositis or sore throat Respiratory: Denies cough, dyspnea or wheezes Cardiovascular: Denies palpitation, chest discomfort Gastrointestinal:  Denies nausea, heartburn or change in bowel habits Skin: Denies abnormal skin rashes Lymphatics: Denies new lymphadenopathy or easy bruising Neurological: Denies numbness, tingling or new weaknesses (+) balance issues Behavioral/Psych: Mood is stable, no new changes  Extremities: No lower extremity edema Breast: denies any pain in either breasts (+) cysts in left breast All other systems were reviewed with the patient and are negative.  I have reviewed the past medical history, past surgical history, social history and family history with the patient and they are unchanged from previous note.  ALLERGIES:  is allergic to sulfa antibiotics; tape; glucosamine forte [nutritional supplements]; and latex.  MEDICATIONS:  Current Outpatient Medications  Medication Sig Dispense Refill  . alendronate (FOSAMAX) 70 MG tablet TAKE ONE TABLET BY MOUTH EVERY SEVEN DAYS. TAKE WITH FULL GLASS OF WATER AT NOON DAILY ON EMPTY STOMACH 4 tablet 6  . Biotin (BIOTIN 5000) 5 MG CAPS Take 5 mg by mouth daily.     . Calcium Carbonate-Vitamin D (CALTRATE 600+D) 600-400 MG-UNIT per tablet Take 1 tablet by mouth daily.     . Cholecalciferol (VITAMIN D3) 1000 UNITS CAPS  Take 1 capsule by mouth daily.      . cycloSPORINE (RESTASIS) 0.05 % ophthalmic emulsion PLACE 1 DROP INTO BOTH EYES TWO (TWO) TIMES DAILY.    [name] diltiazem (CARDIZEM CD) 180 MG 24 hr capsule TAKE 1 CAPSULE (180 MG TOTAL) BY MOUTH DAILY. 30 capsule 12  . ELIQUIS 5 MG TABS tablet TAKE ONE TABLET BY MOUTH TWICE A DAY DAILY 60 tablet 2  . flecainide  (TAMBOCOR) 50 MG tablet TAKE ONE TABLET BY MOUTH TWICE A DAY 60 tablet 1  . ketoconazole (NIZORAL) 2 % cream Apply 1 application topically daily as needed for irritation.     . Multiple Vitamins-Minerals (OCUVITE ADULT 50+) CAPS Take 1 capsule by mouth daily.    . Omega-3 Fatty Acids (FISH OIL PO) Take 1 tablet by mouth daily.     . polyethylene glycol (MIRALAX / GLYCOLAX) packet Take 17 g by mouth daily as needed.    . pravastatin (PRAVACHOL) 40 MG tablet Take 1 tablet (40 mg total) by mouth daily. 90 tablet 2  . Propylene Glycol 0.6 % SOLN Apply 1 drop to eye daily as needed (dry eyes).     . Psyllium (METAMUCIL PO) Take 15 mLs by mouth daily.     . Turmeric 500 MG CAPS Take 1 capsule by mouth daily.     . vitamin B-12 (CYANOCOBALAMIN) 1000 MCG tablet Take 1 tablet (1,000 mcg total) by mouth daily.     No current facility-administered medications for this visit.     PHYSICAL EXAMINATION: ECOG PERFORMANCE STATUS: 1 - Symptomatic but completely ambulatory  Vitals:   07/19/18 1128  BP: (!) 164/79  Pulse: 65  Resp: 17  Temp: 98.1 F (36.7 C)  SpO2: 99%   Filed Weights   07/19/18 1128  Weight: 163 lb 8 oz (74.2 kg)    GENERAL: alert, no distress and comfortable SKIN: skin color, texture, turgor are normal, no rashes or significant lesions EYES: normal, Conjunctiva are pink and non-injected, sclera clear OROPHARYNX: no exudate, no erythema and lips, buccal mucosa, and tongue normal  NECK: supple, thyroid normal size, non-tender, without nodularity LYMPH: no palpable lymphadenopathy in the cervical, axillary or inguinal LUNGS: clear to auscultation and percussion with normal breathing effort HEART: Atrial fibrillation ABDOMEN: abdomen soft, non-tender and normal bowel sounds MUSCULOSKELETAL: no cyanosis of digits and no clubbing  NEURO: alert & oriented x 3 with fluent speech, no focal motor/sensory deficits EXTREMITIES: No lower extremity edema BREAST: Palpable nodularity in  the left breast scar tissue. No palpable axillary supraclavicular or infraclavicular adenopathy no breast tenderness or nipple discharge. (exam performed in the presence of a chaperone)  LABORATORY DATA:  I have reviewed the data as listed CMP Latest Ref Rng & Units 11/16/2017 05/08/2017 11/07/2016  Glucose 70 - 99 mg/dL 92 100(H) 125(H)  BUN 6 - 23 mg/dL _0 Creatinine 0.40 - 1.20 mg/dL 0.82 0.71 1.01(H)  Sodium 135 - 145 mEq/L 139 141 136  Potassium 3.5 - 5.1 mEq/L 3.9 4.1 3.3(L)  Chloride 96 - 112 mEq/L 102 102 99(L)  CO2 19 - 32 mEq/L 29 32 27  Calcium 8.4 - 10.5 mg/dL 10.0 10.3 9.0  Total Protein 6.0 - 8.3 g/dL 6.3 6.7 6.2(L)  Total Bilirubin 0.2 - 1.2 mg/dL 0.6 0.5 0.7  Alkaline Phos 39 - 117 U/L 52 57 53  AST 0 - 37 U/L _1 ALT 0 - 35 U/L _2 Lab Results  Component Value  Date   WBC 8.0 11/16/2017   HGB 14.9 11/16/2017   HCT 43.9 11/16/2017   MCV 93.9 11/16/2017   PLT 250.0 11/16/2017   NEUTROABS 5.5 11/16/2017    ASSESSMENT & PLAN:  Cancer of central portion of left female breast ([place]) Left lumpectomy 06/11/2015: DCIS, left additional margin: IDC 1.3 cm + DCIS 1/4 LN positive, grade 2, margins negative, LVI present, ER 0%, PR 0%, HER-2 negative ratio 0.60, Ki-67 5%, T1cN1a stage II a , Mammaprint high risk ( 5 year risk 22%,10 year risk 29%) luminal type B, Distant metastasis free survival at 5 years 88% with chemotherapy. Adjuvant chemotherapy completed 09/21/2015 Taxotere and Cytoxan 4 cycles Adjuvant radiation delayedCompleted 02/17/2016  Breast Cancer Surveillance: 1. Breast exam1/23/2020:Scartissue in the left breast inferior aspect related to prior surgery,  2. Mammogram12/10/2017: Patient apparently had oil cysts and a 26-monthfollow-up mammogram is recommended.  She will get another mammogram in April.  Breast Density Category C.   Prior history of atrial fibrillation: Currently onEliquis,Cardizem and flecainide..  Prior history of  melanoma  Return to clinic in1 yearfor follow-up and surveillance    No orders of the defined types were placed in this encounter.  The patient has a good understanding of the overall plan. she agrees with it. she will call with any problems that may develop before the next visit here.  GNicholas Lose MD 07/19/2018  IJulious OkaDorshimer am acting as scribe for Dr. VNicholas Lose  I have reviewed the above documentation for accuracy and completeness, and I agree with the above.

## 2018-07-19 ENCOUNTER — Inpatient Hospital Stay: Payer: Medicare PPO | Attending: Hematology and Oncology | Admitting: Hematology and Oncology

## 2018-07-19 ENCOUNTER — Telehealth: Payer: Self-pay | Admitting: Hematology and Oncology

## 2018-07-19 DIAGNOSIS — Z9221 Personal history of antineoplastic chemotherapy: Secondary | ICD-10-CM

## 2018-07-19 DIAGNOSIS — Z7901 Long term (current) use of anticoagulants: Secondary | ICD-10-CM

## 2018-07-19 DIAGNOSIS — Z171 Estrogen receptor negative status [ER-]: Secondary | ICD-10-CM

## 2018-07-19 DIAGNOSIS — C50112 Malignant neoplasm of central portion of left female breast: Secondary | ICD-10-CM

## 2018-07-19 DIAGNOSIS — I4891 Unspecified atrial fibrillation: Secondary | ICD-10-CM

## 2018-07-19 DIAGNOSIS — Z853 Personal history of malignant neoplasm of breast: Secondary | ICD-10-CM | POA: Diagnosis not present

## 2018-07-19 DIAGNOSIS — Z923 Personal history of irradiation: Secondary | ICD-10-CM

## 2018-07-19 DIAGNOSIS — Z85820 Personal history of malignant melanoma of skin: Secondary | ICD-10-CM | POA: Diagnosis not present

## 2018-07-19 DIAGNOSIS — Z79899 Other long term (current) drug therapy: Secondary | ICD-10-CM | POA: Diagnosis not present

## 2018-07-19 MED ORDER — OCUVITE ADULT 50+ PO CAPS: 1.0000 | ORAL_CAPSULE | Freq: Every day | ORAL | Status: DC

## 2018-07-19 MED ORDER — VITAMIN B-12 1000 MCG PO TABS: 1000.0000 ug | ORAL_TABLET | Freq: Every day | ORAL | Status: DC

## 2018-07-19 NOTE — Assessment & Plan Note (Signed)
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 Adjuvant chemotherapy completed 09/21/2015 Taxotere and Cytoxan 4 cycles Adjuvant radiation delayedCompleted 02/17/2016  Breast Cancer Surveillance: 1. Breast exam1/23/2020:Scartissue in the left breast inferior aspect related to prior surgery 2. Mammogram12/10/2017: Benign. Postsurgical changes. Breast Density Category C.   Prior history of atrial fibrillation: Currently onEliquis,Cardizem and flecainide..  Prior history of melanoma  Return to clinic in1 yearfor follow-up and surveillance

## 2018-07-19 NOTE — Telephone Encounter (Signed)
Gave AVS and calendar °

## 2018-08-01 ENCOUNTER — Telehealth: Payer: Self-pay | Admitting: *Deleted

## 2018-08-01 NOTE — Telephone Encounter (Signed)
Received Physician Orders from [facility]; forwarded to provider/SLS 02/05

## 2018-08-08 ENCOUNTER — Other Ambulatory Visit: Payer: Self-pay | Admitting: Family Medicine

## 2018-08-08 ENCOUNTER — Other Ambulatory Visit: Payer: Self-pay | Admitting: Hematology and Oncology

## 2018-08-08 DIAGNOSIS — I4819 Other persistent atrial fibrillation: Secondary | ICD-10-CM

## 2018-08-15 ENCOUNTER — Ambulatory Visit (HOSPITAL_COMMUNITY)
Admission: RE | Admit: 2018-08-15 | Discharge: 2018-08-15 | Disposition: A | Discharge status: Home / Self Care | Payer: Medicare PPO | Source: Ambulatory Visit | Attending: Internal Medicine | Admitting: Internal Medicine

## 2018-08-15 ENCOUNTER — Other Ambulatory Visit: Payer: Self-pay

## 2018-08-15 ENCOUNTER — Encounter (HOSPITAL_COMMUNITY): Payer: Self-pay | Admitting: Internal Medicine

## 2018-08-15 VITALS — BP 140/88 | HR 67 | Wt 166.2 lb

## 2018-08-15 DIAGNOSIS — Z853 Personal history of malignant neoplasm of breast: Secondary | ICD-10-CM | POA: Diagnosis not present

## 2018-08-15 DIAGNOSIS — I480 Paroxysmal atrial fibrillation: Secondary | ICD-10-CM | POA: Insufficient documentation

## 2018-08-15 DIAGNOSIS — Z882 Allergy status to sulfonamides status: Secondary | ICD-10-CM | POA: Diagnosis not present

## 2018-08-15 DIAGNOSIS — Z79899 Other long term (current) drug therapy: Secondary | ICD-10-CM | POA: Diagnosis not present

## 2018-08-15 DIAGNOSIS — Z9012 Acquired absence of left breast and nipple: Secondary | ICD-10-CM | POA: Diagnosis not present

## 2018-08-15 DIAGNOSIS — Z85820 Personal history of malignant melanoma of skin: Secondary | ICD-10-CM | POA: Insufficient documentation

## 2018-08-15 DIAGNOSIS — I10 Essential (primary) hypertension: Secondary | ICD-10-CM | POA: Insufficient documentation

## 2018-08-15 DIAGNOSIS — Z801 Family history of malignant neoplasm of trachea, bronchus and lung: Secondary | ICD-10-CM | POA: Insufficient documentation

## 2018-08-15 DIAGNOSIS — E785 Hyperlipidemia, unspecified: Secondary | ICD-10-CM | POA: Diagnosis not present

## 2018-08-15 DIAGNOSIS — K219 Gastro-esophageal reflux disease without esophagitis: Secondary | ICD-10-CM | POA: Diagnosis not present

## 2018-08-15 DIAGNOSIS — Z809 Family history of malignant neoplasm, unspecified: Secondary | ICD-10-CM | POA: Diagnosis not present

## 2018-08-15 DIAGNOSIS — Z888 Allergy status to other drugs, medicaments and biological substances status: Secondary | ICD-10-CM | POA: Diagnosis not present

## 2018-08-15 DIAGNOSIS — Z8249 Family history of ischemic heart disease and other diseases of the circulatory system: Secondary | ICD-10-CM | POA: Diagnosis not present

## 2018-08-15 DIAGNOSIS — Z7901 Long term (current) use of anticoagulants: Secondary | ICD-10-CM | POA: Insufficient documentation

## 2018-08-15 DIAGNOSIS — R2689 Other abnormalities of gait and mobility: Secondary | ICD-10-CM | POA: Diagnosis not present

## 2018-08-15 NOTE — Progress Notes (Signed)
Patient ID: Jessica Mullins, female   DOB: 15-Oct-1930, 85 y.o.   MRN: [id number]  CARDIOLOGY CLINIC  NOTE  Referring Physician: Lindi Adie Primary Care: Etter Sjogren Primary Cardiologist: None  HPI:  Jessica Mullins is a an 85 y.o woman with h/o HTN, HL, PAF and left breast CA.   Diagnosed with breast CA in 11/17. Underwent lumpectomy in 12/16. Had 2/4 rounds of chemo so far with taxotere and Cytoxan last dose Monday.  Found to have AF with RVR in 2017. Started on Eliquis. Echo normal.   We arranged for TEE/DC-CV on 08/14/15 but when she arrived for procedure she was in NSR. Given high risk of recurrence we started flecainide 50 bid. She had treadmill test to exclude proarrhythmia. Baseline ECG with NSR 73. QRS and PR intervlas normal. No arrhythmias. Nonspecific ST changes with exercise.   Returns for f/u: Overall doing well. Main complaint is recurrent falls and poor balance. Has seen Neurology and had NCS and was referred to PT. Remains on flecainide. In 5/18 had brief episode of PAF in setting of severe bronchitis. No other episodes. No palpitations. No bleeding with Eliquis. Doing exercise program from TV.    Past Medical History:  Diagnosis Date  . Allergy   . Ankle fracture, left   . Breast cancer ([facility]) 1115/16   left   . Bursitis of right shoulder   . Cataract   . Family history of cancer   . Fracture of right wrist   . GERD (gastroesophageal reflux disease)   . Hyperlipidemia   . Hypertension   . Skin cancer 2000   melanoma and basal cell  . Vaso vagal episode    during preparation for colonoscopy    Current Outpatient Medications  Medication Sig Dispense Refill  . alendronate (FOSAMAX) 70 MG tablet TAKE ONE TABLET BY MOUTH EVERY SEVEN DAYS. TAKE WITH GLASS OF WATER AT NOON DAILY ON EMPTY STOMACH 12 tablet 1  . Biotin (BIOTIN 5000) 5 MG CAPS Take 5 mg by mouth daily.     . Calcium Carbonate-Vitamin D (CALTRATE 600+D) 600-400 MG-UNIT per tablet Take 1 tablet by mouth daily.     .  Cholecalciferol (VITAMIN D3) 1000 UNITS CAPS Take 1 capsule by mouth daily.      . cycloSPORINE (RESTASIS) 0.05 % ophthalmic emulsion PLACE 1 DROP INTO BOTH EYES TWO (TWO) TIMES DAILY.    [name] diltiazem (CARDIZEM CD) 180 MG 24 hr capsule TAKE 1 CAPSULE (180 MG TOTAL) BY MOUTH DAILY. 30 capsule 12  . ELIQUIS 5 MG TABS tablet TAKE ONE TABLET BY MOUTH TWICE A DAY 60 tablet 1  . flecainide (TAMBOCOR) 50 MG tablet TAKE ONE TABLET BY MOUTH TWICE A DAY 60 tablet 1  . ketoconazole (NIZORAL) 2 % cream Apply 1 application topically daily as needed for irritation.     . Omega-3 Fatty Acids (FISH OIL PO) Take 1 tablet by mouth daily.     . pravastatin (PRAVACHOL) 40 MG tablet Take 1 tablet (40 mg total) by mouth daily. 90 tablet 2  . Turmeric 500 MG CAPS Take 1 capsule by mouth daily.     . vitamin B-12 (CYANOCOBALAMIN) 1000 MCG tablet Take 1 tablet (1,000 mcg total) by mouth daily.    . Multiple Vitamins-Minerals (OCUVITE ADULT 50+) CAPS Take 1 capsule by mouth daily. (Patient not taking: Reported on 08/15/2018)    . polyethylene glycol (MIRALAX / GLYCOLAX) packet Take 17 g by mouth daily as needed.    [name] Propylene Glycol 0.6 % SOLN  Apply 1 drop to eye daily as needed (dry eyes).     . Psyllium (METAMUCIL PO) Take 15 mLs by mouth daily.      No current facility-administered medications for this encounter.     Allergies  Allergen Reactions  . Sulfa Antibiotics Other (See Comments)    As a child, rigid as a stick and not responsive  . Tape Other (See Comments)    Blisters, Please use "paper" tape only for short periods of time  . Glucosamine Forte [Nutritional Supplements] Rash  . Latex Rash      Social History   Socioeconomic History  . Marital status: Widowed    Spouse name: Not on file  . Number of children: 2  . Years of education: Not on file  . Highest education level: Master's degree (e.g., MA, MS, MEng, MEd, [name], [name])  Occupational History  . 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 Financial resource strain: Not on file  . Food insecurity:    Worry: Not on file    Inability: Not on file  . Transportation needs:    Medical: Not on file    Non-medical: Not on file  Tobacco Use  . Smoking status: Never Smoker  . Smokeless tobacco: Never Used  Substance and Sexual Activity  . Alcohol use: No  . Drug use: No  . Sexual activity: Never  Lifestyle  . Physical activity:    Days per week: Not on file    Minutes per session: Not on file  . Stress: Not on file  Relationships  . Social connections:    Talks on phone: Not on file    Gets together: Not on file    Attends religious service: Not on file    Active member of club or organization: Not on file    Attends meetings of clubs or organizations: Not on file    Relationship status: Not on file  . Intimate partner violence:    Fear of current or ex partner: Not on file    Emotionally abused: Not on file    Physically abused: Not on file    Forced sexual activity: Not on file  Other Topics Concern  . Not on file  Social History Narrative   Patient is widowed.  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    One son one daughter   1 caffeinated beverage daily   She is right-handed.    She works out everyday to a televised [employer] 30 minute program.   No tobacco or alcohol      Family History  Problem Relation Age of Onset  . Cancer Mother        mets to bone--? primary  . Coronary artery disease Brother        died of M! @ 72  . Diabetes Brother   . Hyperlipidemia Brother   . Hypertension Brother   . Heart disease Brother        chf  . Heart disease Father 74       MI  . Lung cancer Sister 65       former smoker  . Diabetes Maternal Aunt   . Lung cancer Unknown   . Thrombosis Unknown        thromboembolism clotting disorder--? father died of ? clot     Vitals:   2018/08/17 1147  BP: 140/88  Pulse: 67  SpO2: 95%  Weight: 75.4 kg (166 lb 3.2 oz)     PHYSICAL EXAM: General:  Well appearing. No resp difficulty HEENT: normal Neck: supple. no JVD. Carotids 2+ bilat; no bruits. No lymphadenopathy or thryomegaly appreciated. Cor: PMI nondisplaced. Regular rate & rhythm. No rubs, gallops or murmurs. Lungs: clear Abdomen: soft, nontender, nondistended. No hepatosplenomegaly. No bruits or masses. Good bowel sounds. Extremities: no cyanosis, clubbing, rash, edema Neuro: alert & orientedx3, cranial nerves grossly intact. moves all 4 extremities w/o difficulty. Affect pleasant  ASSESSMENT & PLAN: 1. PAF with RVR     --CHADSVASC 4 (age (4), HTN, female)      --echo 2017 normal     --maintaining NSR on flecainide with only rare breakthrough.     --Continue flecainide, diltiazem and Eliquis.      --check ECG -? NSR 61 PR 156 QRS 73ms (stable)  2. Breast CA     - s/p mastectomy.  Doree Kuehne,MD 12:25 PM

## 2018-08-15 NOTE — Patient Instructions (Addendum)
EKG was completed.  Your physician wants you to follow-up in: [place] will receive a reminder letter in the mail two months in advance. If you don't receive a letter, please call our office to schedule the follow-up appointment.

## 2018-08-15 NOTE — Addendum Note (Signed)
Encounter addended by: Jovita Kussmaul, RN on: 08/15/2018 1:50 PM  Actions taken: Clinical Note Signed

## 2018-08-29 ENCOUNTER — Other Ambulatory Visit (HOSPITAL_COMMUNITY): Payer: Self-pay | Admitting: Internal Medicine

## 2018-09-19 ENCOUNTER — Other Ambulatory Visit: Payer: Self-pay | Admitting: *Deleted

## 2018-09-19 ENCOUNTER — Other Ambulatory Visit (HOSPITAL_COMMUNITY): Payer: Self-pay | Admitting: *Deleted

## 2018-09-19 DIAGNOSIS — I4819 Other persistent atrial fibrillation: Secondary | ICD-10-CM

## 2018-09-19 MED ORDER — APIXABAN 5 MG PO TABS: 5.0000 mg | ORAL_TABLET | Freq: Two times a day (BID) | ORAL | 0 refills | Status: DC

## 2018-09-19 MED ORDER — DILTIAZEM HCL ER COATED BEADS 180 MG PO CP24: ORAL_CAPSULE | ORAL | 3 refills | Status: DC

## 2018-09-20 ENCOUNTER — Other Ambulatory Visit: Payer: Self-pay | Admitting: Family Medicine

## 2018-10-30 DIAGNOSIS — Z853 Personal history of malignant neoplasm of breast: Secondary | ICD-10-CM | POA: Diagnosis not present

## 2018-10-30 DIAGNOSIS — N6321 Unspecified lump in the left breast, upper outer quadrant: Secondary | ICD-10-CM | POA: Diagnosis not present

## 2018-11-06 ENCOUNTER — Encounter: Payer: Self-pay | Admitting: *Deleted

## 2018-11-06 ENCOUNTER — Telehealth: Payer: Self-pay | Admitting: *Deleted

## 2018-11-06 NOTE — Telephone Encounter (Signed)
Received Mammogram results from [place]; forwarded to provider/SLS 05/12

## 2018-11-06 NOTE — Progress Notes (Signed)
Received Mammogram results from [place].  Sent to be scanned into epic.

## 2018-11-07 IMAGING — MR MR LUMBAR SPINE W/O CM
4 of 5 series · 24 of 48 positions shown · non-contrast
Comparison: Chest radiographs 11/07/2016.

CLINICAL DATA: 87-year-old female with unsteady gait and frequent
falls. Symptom onset after breast cancer treatment 2 years ago.

EXAM:
MRI LUMBAR SPINE WITHOUT CONTRAST
TECHNIQUE: Multiplanar, multisequence MR imaging of the lumbar spine was
performed. No intravenous contrast was administered.

[Series 6: T1 · axial · 4.0mm · 0.35mm/px · z∈[-36,+139]mm · 6 of 35 slices shown (1 of 2)]
[im 1/35]
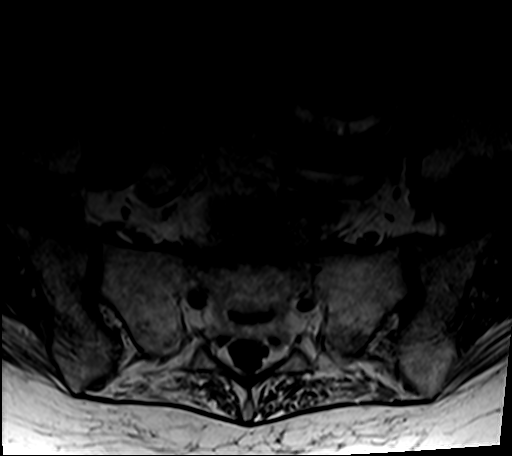
[im 5/35]
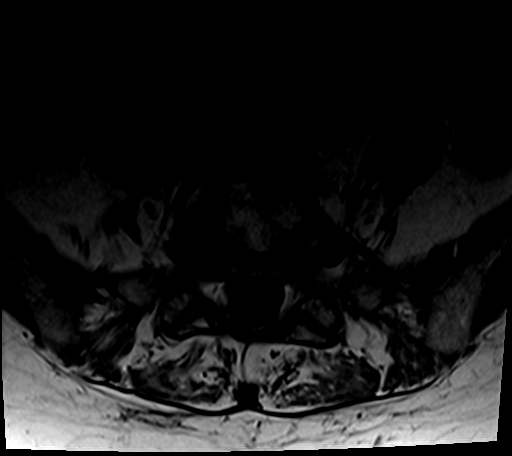
[im 10/35]
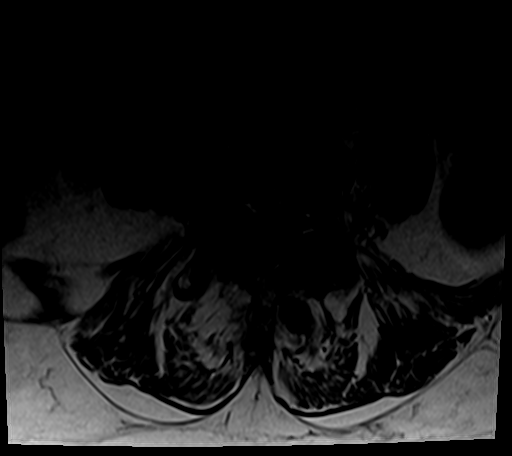
[im 15/35]
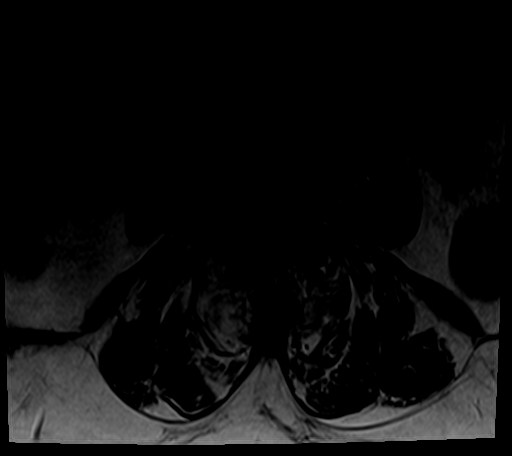
[im 18/35]
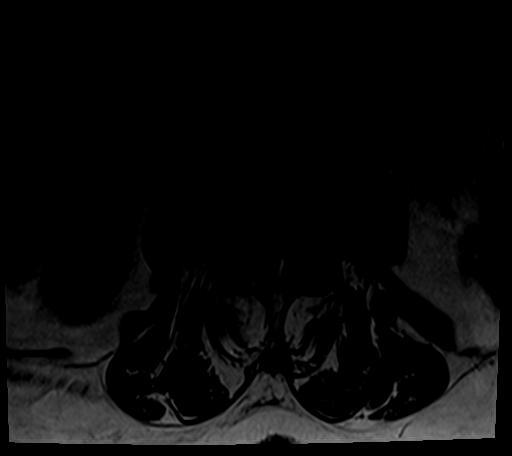
[im 30/35]
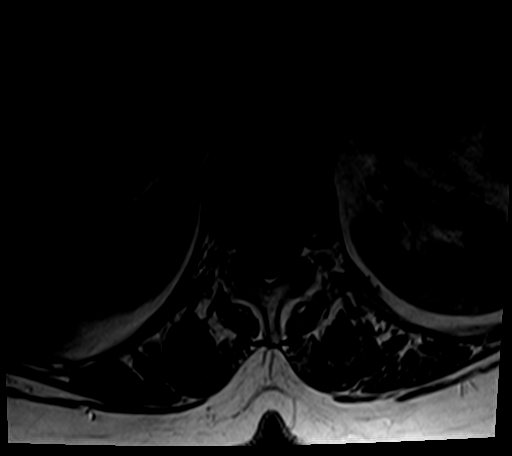

[Series 7: T2 · axial · 4.0mm · 0.70mm/px · z∈[-36,+179]mm · 9 of 35 slices shown]
[im 1/35]
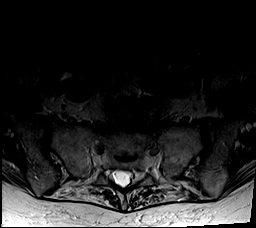
[im 5/35]
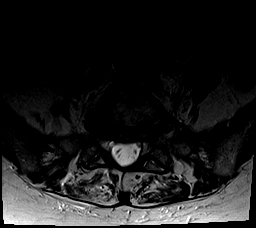
[im 10/35]
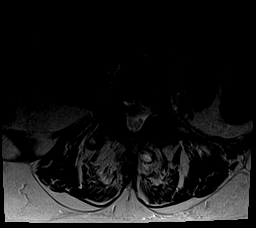
[im 15/35]
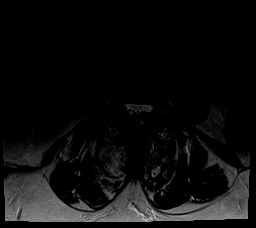
[im 18/35]
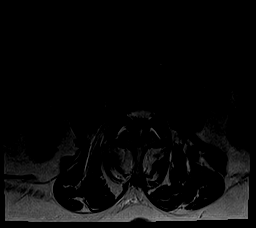
[im 20/35]
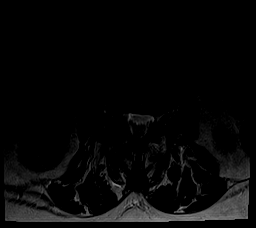
[im 25/35]
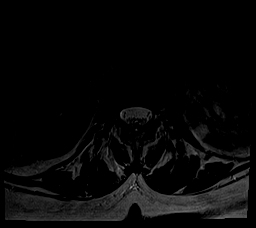
[im 30/35]
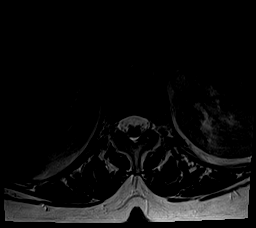
[im 35/35]
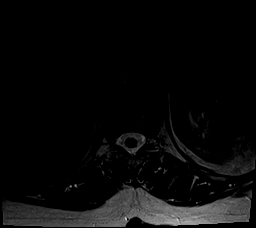

[Series 8: T2 post-contrast · sagittal · 4.0mm · 0.55mm/px · 6 of 15 slices shown]
[im 1/15]
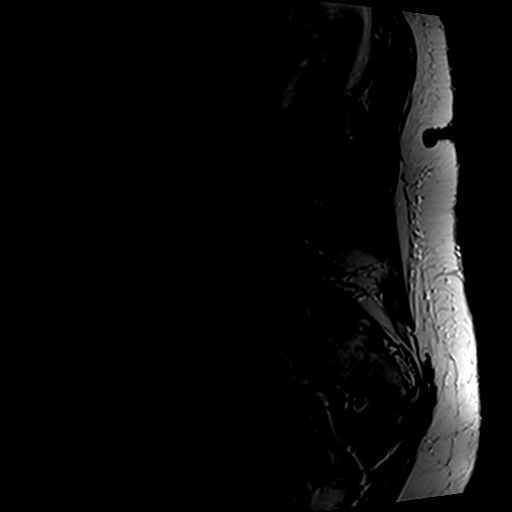
[im 3/15]
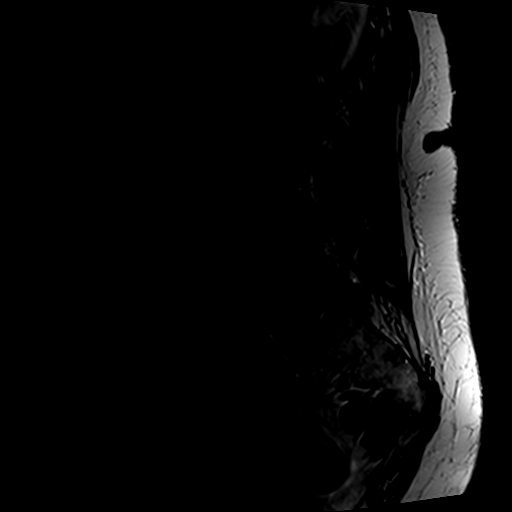
[im 6/15]
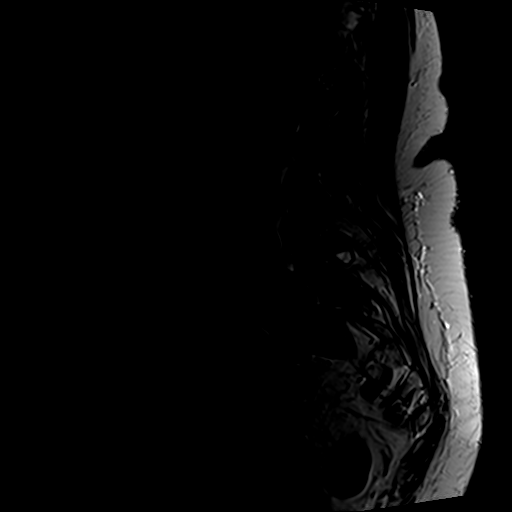
[im 9/15]
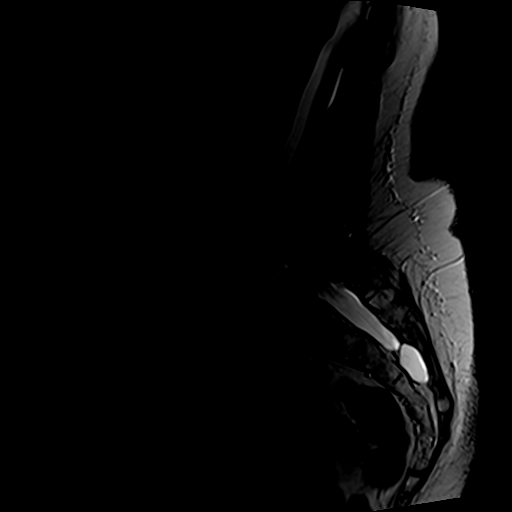
[im 12/15]
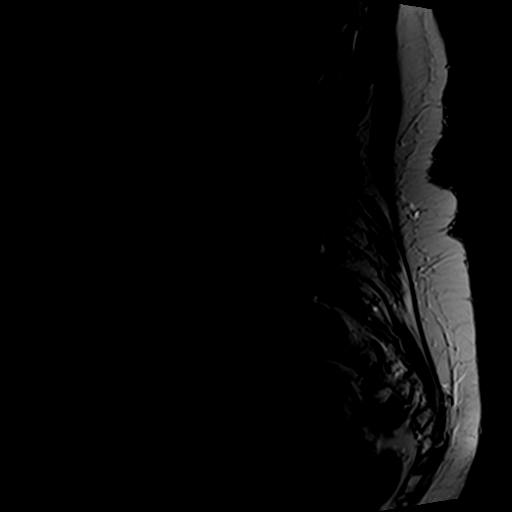
[im 15/15]
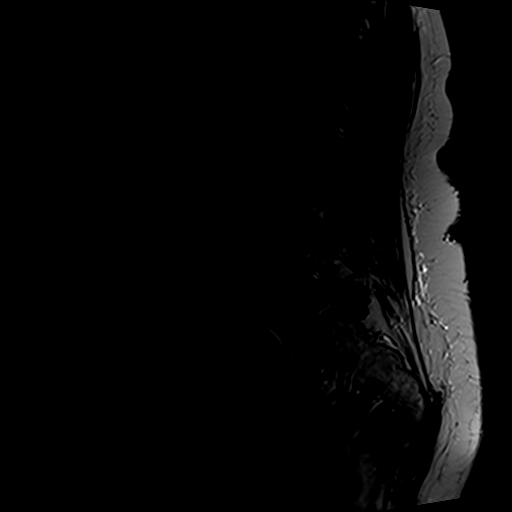

[Series 10: T1 · sagittal · 4.0mm · 0.55mm/px · 3 of 15 slices shown (2 of 2)]
[im 3/15]
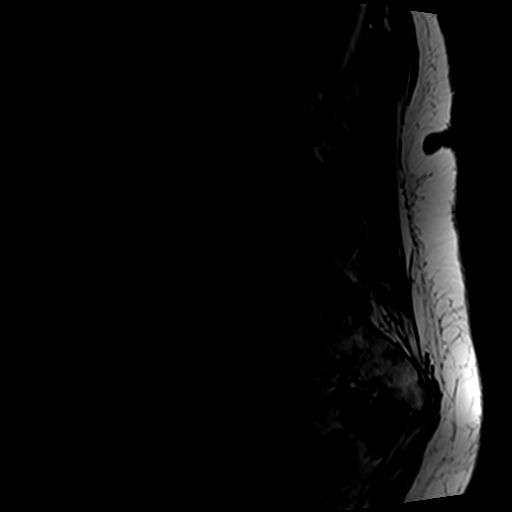
[im 9/15]
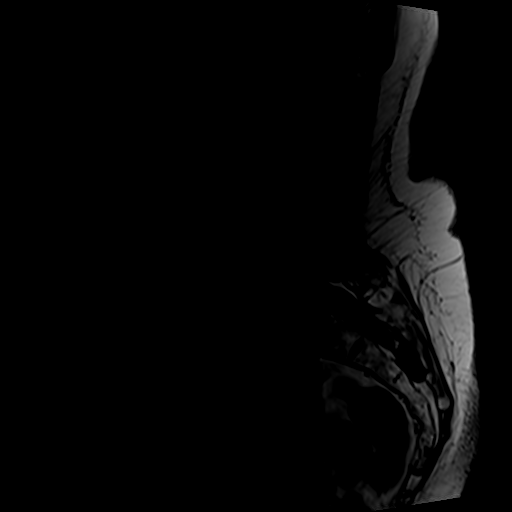
[im 15/15]
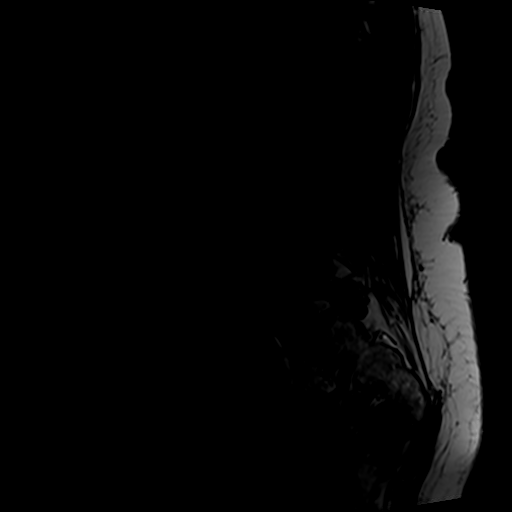

[24 of 48 positions shown; findings below may reference images not displayed]

FINDINGS: Segmentation: Lumbar segmentation appears to be normal and will be
designated as such for this report.

Alignment: Grade 1 spondylolisthesis at both L4-L5 (7 millimeters)
and L5-S1 (5 millimeters). Superimposed mild levoconvex lumbar
curvature and mildly exaggerated lumbar lordosis.

Vertebrae: Mild degenerative appearing posterior endplate marrow
edema at L2-L3 eccentric to the right. Background bone marrow signal
appears within normal limits. Intact visible sacral ala and SI
joints.

However, there is marrow edema in the lowest S5 sacral segment
centrally (series 9, image 6) abutting the coccygeal segments.

Conus medullaris and cauda equina: Conus extends to the L1 level. No
lower spinal cord or conus signal abnormality.

Paraspinal and other soft tissues: Negative visible abdominal
viscera. Paraspinal soft tissues are negative aside from some
degenerative synovial cysts, maximal at the L4 level (series 9,
image 10).

Disc levels:

T11-T12: Disc desiccation and disc space loss with mild disc bulge.
Moderate facet and ligament flavum hypertrophy with degenerative
facet joint fluid. Mild to moderate T11 foraminal stenosis greater
on the right.

T12-L1:  Negative.

L1-L2: Negative disc. Mild facet hypertrophy. No significant
stenosis.

L2-L3: Disc desiccation. Circumferential disc bulge with broad-based
posterior and biforaminal involvement. Moderate facet and ligament
flavum hypertrophy with degenerative facet joint fluid. Degenerative
signal also in the interspinous ligament (series 9, image 8).

Borderline to mild spinal and bilateral lateral recess stenosis (L3
nerve level) and mild to moderate bilateral L2 foraminal stenosis.

L3-L4: Disc desiccation with mild circumferential disc bulge.
Moderate to severe ligament flavum and moderate facet degeneration
with degenerative facet joint fluid. Degenerative signal in the
interspinous ligament, and multiple bilateral posteriorly situated
synovial cysts (should not cause neural compromise series 7, images
24 and 25).

Mild to moderate spinal stenosis with mild if any bilateral lateral
recess stenosis (L4 nerve levels). Mild left and mild to moderate
right L3 foraminal stenosis.

L4-L5: Grade 1 spondylolisthesis with severe disc space loss and
severe bilateral facet hypertrophy. Mild to moderate spinal and left
lateral recess stenosis (left L5 nerve level). There is severe right
lateral recess and right L4 foraminal stenosis.

L5-S1: Grade 1 anterolisthesis with severe disc space loss. Moderate
facet hypertrophy. No spinal or lateral recess stenosis. Moderate to
severe left and mild right L5 foraminal stenosis.
IMPRESSION: 1. Grade 1 spondylolisthesis at L4-L5 and L5-S1 with severe chronic
disc degeneration. Widespread moderate and occasionally severe
lumbar posterior element degeneration, maximal at L3-L4 and L4-L5.
2. Mild marrow edema in the lowest sacral S5 segment. This is
probably a bone contusion or subacute fracture related to fall
(tailbone injury). No other acute osseous abnormality.
3. Up to moderate spinal stenosis at L3-L4 and L4-L5. Moderate to
severe right greater than left lateral recess stenosis at L4-L5
(bilateral L5 nerve levels) and severe right L4 neural foraminal
stenosis.
4. There is also moderate or severe neural foraminal stenosis at the
bilateral L2 and left L5 nerve levels.

## 2018-11-09 NOTE — Progress Notes (Signed)
Virtual Visit via Video Note  I connected with patient on 11/12/18 at  9:00 AM EDT by a video enabled telemedicine application and verified that I am speaking with the correct person using two identifiers.   THIS ENCOUNTER IS A VIRTUAL VISIT DUE TO COVID-19 - PATIENT WAS NOT SEEN IN THE OFFICE. PATIENT HAS CONSENTED TO VIRTUAL VISIT / TELEMEDICINE VISIT   Location of patient: home  Location of provider: office  I discussed the limitations of evaluation and management by telemedicine and the availability of in person appointments. The patient expressed understanding and agreed to proceed.   Subjective:   Jessica Mullins is a 85 y.o. female who presents for Medicare Annual (Subsequent) preventive examination.  Review of Systems: No ROS.  Medicare Wellness Virtual Visit.  Visual/audio telehealth visit, UTA vital signs.   See social history for additional risk factors. Cardiac Risk Factors include: advanced age (>36men, >53 women);dyslipidemia;hypertension Sleep patterns: No issues Home Safety/Smoke Alarms: Feels safe in home. Smoke alarms in place.  Lives alone in 1 story home. Wears emergency necklace. Walk in shower.    Female:         Mammo- 05/31/18      Dexa scan- ordered       Objective:     Vitals: BP 126/71 Comment: pt reports  Pulse 61 Comment: pt reports    Advanced Directives 11/12/2018 04/11/2018 12/26/2017 12/06/2017 11/09/2017 11/08/2016 11/07/2016  Does Patient Have a Medical Advance Directive? Yes No Yes Yes Yes Yes Yes  Type of Paramedic of [place];Living will - [place];Living will [place];Living will [place];Living will - -  Does patient want to make changes to medical advance directive? No - Guardian declined - No - Patient declined Yes (MAU/Ambulatory/Procedural Areas - Information given) - - -  Copy of [place] in Chart? Yes - validated most recent copy  scanned in chart (See row information) - Yes Yes No - copy requested - -  Would patient like information on creating a medical advance directive? - No - Patient declined - - - - -    Tobacco Social History   Tobacco Use  Smoking Status Never Smoker  Smokeless Tobacco Never Used     Counseling given: Not Answered   Clinical Intake:     Pain : No/denies pain                 Past Medical History:  Diagnosis Date  . Allergy   . Ankle fracture, left   . Breast cancer ([place]) 1115/16   left   . Bursitis of right shoulder   . Cataract   . Family history of cancer   . Fracture of right wrist   . GERD (gastroesophageal reflux disease)   . Hyperlipidemia   . Hypertension   . Skin cancer 2000   melanoma and basal cell  . Vaso vagal episode    during preparation for colonoscopy   Past Surgical History:  Procedure Laterality Date  . ABDOMINAL HYSTERECTOMY  1988   TAH/BSO--FIBROIDS  . APPENDECTOMY    . BREAST LUMPECTOMY     B/L--[facility]  . BREAST LUMPECTOMY WITH RADIOACTIVE SEED AND SENTINEL LYMPH NODE BIOPSY Left 06/11/2015   Procedure: LEFT BREAST LUMPECTOMY WITH RADIOACTIVE SEED AND LEFT SENTINEL LYMPH NODE MAPPING;  Surgeon: Erroll Luna, MD;  Location: [facility];  Service: General;  Laterality: Left;  . CATARACT EXTRACTION  2015  . COLONOSCOPY  2010  .  DG  BONE DENSITY ([place] [facility])    . EYE SURGERY Bilateral    cataracts  . fiberadenoma Bilateral 1978, 1980  . GANGLION CYST EXCISION     L  hand  . Lipiflow procedure     Family History  Problem Relation Age of Onset  . Cancer Mother        mets to bone--? primary  . Coronary artery disease Brother        died of M! @ 74  . Diabetes Brother   . Hyperlipidemia Brother   . Hypertension Brother   . Heart disease Brother        chf  . Heart disease Father 57       MI  . Lung cancer Sister 1       former smoker  . Diabetes Maternal Aunt   . Lung cancer Other   . Thrombosis Other        thromboembolism  clotting disorder--? father died of ? clot    Social History   Socioeconomic History  . Marital status: Widowed    Spouse name: Not on file  . Number of children: 2  . Years of education: Not on file  . Highest education level: Master's degree (e.g., MA, MS, MEng, MEd, [name], [name])  Occupational History  . 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 Financial resource strain: Not on file  . Food insecurity:    Worry: Not on file    Inability: Not on file  . Transportation needs:    Medical: Not on file    Non-medical: Not on file  Tobacco Use  . Smoking status: Never Smoker  . Smokeless tobacco: Never Used  Substance and Sexual Activity  . Alcohol use: No  . Drug use: No  . Sexual activity: Never  Lifestyle  . Physical activity:    Days per week: Not on file    Minutes per session: Not on file  . Stress: Not on file  Relationships  . Social connections:    Talks on phone: Not on file    Gets together: Not on file    Attends religious service: Not on file    Active member of club or organization: Not on file    Attends meetings of clubs or organizations: Not on file    Relationship status: Not on file  Other Topics Concern  . Not on file  Social History Narrative   Patient is widowed.  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    One son one daughter   1 caffeinated beverage daily   She is right-handed.    She works out everyday to a televised [employer] 30 minute program.   No tobacco or alcohol    Outpatient Encounter Medications as of 11/12/2018  Medication Sig  . alendronate (FOSAMAX) 70 MG tablet TAKE ONE TABLET BY MOUTH EVERY SEVEN DAYS. TAKE WITH GLASS OF WATER AT NOON DAILY ON EMPTY STOMACH  . apixaban (ELIQUIS) 5 MG TABS tablet Take 1 tablet (5 mg total) by mouth 2 (two) times daily.  . Biotin (BIOTIN 5000) 5 MG CAPS Take 5 mg by mouth daily.   . Calcium Carbonate-Vitamin D (CALTRATE 600+D) 600-400 MG-UNIT per  tablet Take 1 tablet by mouth daily.   . Cholecalciferol (VITAMIN D3) 1000 UNITS CAPS Take 1 capsule by mouth daily.    . cycloSPORINE (RESTASIS) 0.05 % ophthalmic emulsion PLACE 1 DROP  INTO BOTH EYES TWO (TWO) TIMES DAILY.  [name] diltiazem (CARDIZEM CD) 180 MG 24 hr capsule TAKE 1 CAPSULE (180 MG TOTAL) BY MOUTH DAILY.  . flecainide (TAMBOCOR) 50 MG tablet TAKE ONE TABLET BY MOUTH TWICE A DAY  . ketoconazole (NIZORAL) 2 % cream Apply 1 application topically daily as needed for irritation.   . Multiple Vitamins-Minerals (OCUVITE ADULT 50+) CAPS Take 1 capsule by mouth daily.  . Omega-3 Fatty Acids (FISH OIL PO) Take 1 tablet by mouth daily.   . polyethylene glycol (MIRALAX / GLYCOLAX) packet Take 17 g by mouth daily as needed.  . pravastatin (PRAVACHOL) 40 MG tablet Take 1 tablet (40 mg total) by mouth daily.  [name] Propylene Glycol 0.6 % SOLN Apply 1 drop to eye daily as needed (dry eyes).   . Psyllium (METAMUCIL PO) Take 15 mLs by mouth daily.   . Turmeric 500 MG CAPS Take 1 capsule by mouth daily.   . vitamin B-12 (CYANOCOBALAMIN) 1000 MCG tablet Take 1 tablet (1,000 mcg total) by mouth daily.  . [DISCONTINUED] alendronate (FOSAMAX) 70 MG tablet TAKE ONE TABLET BY MOUTH EVERY SEVEN DAYS. TAKE WITH FULL GLASS OF WATER AT NOON DAILY ON EMPTY STOMACH  . [DISCONTINUED] diltiazem (CARDIZEM CD) 180 MG 24 hr capsule TAKE 1 CAPSULE (180 MG TOTAL) BY MOUTH DAILY.  . [DISCONTINUED] ELIQUIS 5 MG TABS tablet TAKE 1 TABLET (5 MG TOTAL) BY MOUTH TWO TIMES DAILY.  . [DISCONTINUED] flecainide (TAMBOCOR) 50 MG tablet TAKE 1 TABLET (50 MG TOTAL) BY MOUTH TWO TIMES DAILY.  . [DISCONTINUED] loratadine (CLARITIN) 10 MG tablet Take 10 mg by mouth daily.  . [DISCONTINUED] pravastatin (PRAVACHOL) 40 MG tablet TAKE 1 TABLET (40 MG TOTAL) BY MOUTH DAILY. REPEAT LABS ARE DUE NOW  . [DISCONTINUED] sodium chloride 0.9 % injection 10 mL    No facility-administered encounter medications on file as of 11/12/2018.     Activities  of Daily Living In your present state of health, do you have any difficulty performing the following activities: 11/12/2018  Hearing? N  Vision? N  Comment wears glasses  Difficulty concentrating or making decisions? N  Walking or climbing stairs? Y  Dressing or bathing? N  Doing errands, shopping? N  Preparing Food and eating ? N  Using the Toilet? N  In the past six months, have you accidently leaked urine? N  Do you have problems with loss of bowel control? N  Managing your Medications? N  Managing your Finances? N  Housekeeping or managing your Housekeeping? N  Some recent data might be hidden    Patient Care Team: Carollee Herter, Alferd Apa, DO as PCP - General Nicholas Lose, MD as Consulting Physician (Hematology and Oncology) Allyn Kenner, MD as Consulting Physician (Dermatology) Monna Fam, MD as Consulting Physician (Ophthalmology)    Assessment:   This is a routine wellness examination for Lyndia. Physical assessment deferred to PCP.  Exercise Activities and Dietary recommendations Current Exercise Habits: Home exercise routine, Type of exercise: calisthenics, Time (Minutes): 30, Frequency (Times/Week): 5, Weekly Exercise (Minutes/Week): 150, Intensity: Mild, Exercise limited by: None identified   Diet (meal preparation, eat out, water intake, caffeinated beverages, dairy products, fruits and vegetables): in general, a "healthy" diet  , well balanced, on average, 3 meals per day   Goals    . Maintain healthy lifestyle       Fall Risk Fall Risk  11/12/2018 02/01/2018 11/09/2017 09/13/2016 08/29/2016  Falls in the past year? 1 Yes No Yes Yes  Number  falls in past yr: 1 2 or more - 2 or more 2 or more  Comment - - - - -  Injury with Fall? 0 Yes - No No  Risk Factor Category  - High Fall Risk - - -  Risk for fall due to : Impaired balance/gait - - - -  Risk for fall due to: Comment - - - - -  Follow up - - - Education provided;Falls prevention discussed -    Depression  Screen PHQ 2/9 Scores 11/12/2018 11/09/2017 09/13/2016 08/29/2016  PHQ - 2 Score 0 0 0 0     Cognitive Function Ad8 score reviewed for issues:  Issues making decisions:no  Less interest in hobbies / activities:no  Repeats questions, stories (family complaining):no  Trouble using ordinary gadgets (microwave, computer, phone):no  Forgets the month or year: no  Mismanaging finances: no  Remembering appts:no  Daily problems with thinking and/or memory:no Ad8 score is=0  MMSE - Mini Mental State Exam 11/09/2017 09/13/2016  Orientation to time 5 5  Orientation to Place 5 5  Registration 3 3  Attention/ Calculation 5 5  Recall 2 3  Language- name 2 objects 2 2  Language- repeat 1 1  Language- follow 3 step command 3 3  Language- read & follow direction 1 1  Write a sentence 1 1  Copy design 1 1  Total score 29 30        Immunization History  Administered Date(s) Administered  . H1N1 07/01/2008  . Influenza Whole 04/10/2007, 04/01/2009, 03/26/2010, 03/25/2012  . Influenza, High Dose Seasonal PF 04/16/2014, 04/09/2015, 04/03/2017, 04/19/2018  . Influenza-Unspecified 06/27/2016, 04/03/2017  . Pneumococcal Conjugate-13 05/13/2014  . Pneumococcal Polysaccharide-23 08/29/2016  . Td 11/05/2003  . Tdap 06/28/2011, 09/19/2011  . Zoster 02/24/2010  . Zoster Recombinat (Shingrix) 10/08/2016, 01/03/2017   Screening Tests Health Maintenance  Topic Date Due  . INFLUENZA VACCINE  01/26/2019  . MAMMOGRAM  06/01/2019  . TETANUS/TDAP  09/18/2021  . DEXA SCAN  Completed  . PNA vac Low Risk Adult  Completed      Plan:   See you next year.  Continue to eat heart healthy diet (full of fruits, vegetables, whole grains, lean protein, water--limit salt, fat, and sugar intake) and increase physical activity as tolerated.  Continue doing brain stimulating activities (puzzles, reading, adult coloring books, staying active) to keep memory sharp.    I have personally reviewed and noted  the following in the patient's chart:   . Medical and social history . Use of alcohol, tobacco or illicit drugs  . Current medications and supplements . Functional ability and status . Nutritional status . Physical activity . Advanced directives . List of other physicians . Hospitalizations, surgeries, and ER visits in previous 12 months . Vitals . Screenings to include cognitive, depression, and falls . Referrals and appointments  In addition, I have reviewed and discussed with patient certain preventive protocols, quality metrics, and best practice recommendations. A written personalized care plan for preventive services as well as general preventive health recommendations were provided to patient.     Shela Nevin, [place]  11/12/2018

## 2018-11-12 ENCOUNTER — Ambulatory Visit (INDEPENDENT_AMBULATORY_CARE_PROVIDER_SITE_OTHER): Payer: Medicare PPO | Admitting: *Deleted

## 2018-11-12 ENCOUNTER — Other Ambulatory Visit: Payer: Self-pay

## 2018-11-12 ENCOUNTER — Encounter: Payer: Self-pay | Admitting: *Deleted

## 2018-11-12 VITALS — BP 126/71 | HR 61

## 2018-11-12 DIAGNOSIS — Z780 Asymptomatic menopausal state: Secondary | ICD-10-CM | POA: Diagnosis not present

## 2018-11-12 DIAGNOSIS — Z0000 Encounter for general adult medical examination without abnormal findings: Secondary | ICD-10-CM

## 2018-11-12 NOTE — Patient Instructions (Signed)
See you next year.  Continue to eat heart healthy diet (full of fruits, vegetables, whole grains, lean protein, water--limit salt, fat, and sugar intake) and increase physical activity as tolerated.  Continue doing brain stimulating activities (puzzles, reading, adult coloring books, staying active) to keep memory sharp.    Jessica Mullins , Thank you for taking time to come for your Medicare Wellness Visit. I appreciate your ongoing commitment to your health goals. Please review the following plan we discussed and let me know if I can assist you in the future.   These are the goals we discussed: Goals    . Maintain healthy lifestyle       This is a list of the screening recommended for you and due dates:  Health Maintenance  Topic Date Due  . Flu Shot  01/26/2019  . Mammogram  06/01/2019  . Tetanus Vaccine  09/18/2021  . DEXA scan (bone density measurement)  Completed  . Pneumonia vaccines  Completed    Health Maintenance After Age 38 After age 15, you are at a higher risk for certain long-term diseases and infections as well as injuries from falls. Falls are a major cause of broken bones and head injuries in people who are older than age 15. Getting regular preventive care can help to keep you healthy and well. Preventive care includes getting regular testing and making lifestyle changes as recommended by your health care provider. Talk with your health care provider about:  Which screenings and tests you should have. A screening is a test that checks for a disease when you have no symptoms.  A diet and exercise plan that is right for you. What should I know about screenings and tests to prevent falls? Screening and testing are the best ways to find a health problem early. Early diagnosis and treatment give you the best chance of managing medical conditions that are common after age 84. Certain conditions and lifestyle choices may make you more likely to have a fall. Your health care  provider may recommend:  Regular vision checks. Poor vision and conditions such as cataracts can make you more likely to have a fall. If you wear glasses, make sure to get your prescription updated if your vision changes.  Medicine review. Work with your health care provider to regularly review all of the medicines you are taking, including over-the-counter medicines. Ask your health care provider about any side effects that may make you more likely to have a fall. Tell your health care provider if any medicines that you take make you feel dizzy or sleepy.  Osteoporosis screening. Osteoporosis is a condition that causes the bones to get weaker. This can make the bones weak and cause them to break more easily.  Blood pressure screening. Blood pressure changes and medicines to control blood pressure can make you feel dizzy.  Strength and balance checks. Your health care provider may recommend certain tests to check your strength and balance while standing, walking, or changing positions.  Foot health exam. Foot pain and numbness, as well as not wearing proper footwear, can make you more likely to have a fall.  Depression screening. You may be more likely to have a fall if you have a fear of falling, feel emotionally low, or feel unable to do activities that you used to do.  Alcohol use screening. Using too much alcohol can affect your balance and may make you more likely to have a fall. What actions can I take to lower my  risk of falls? General instructions  Talk with your health care provider about your risks for falling. Tell your health care provider if: ? You fall. Be sure to tell your health care provider about all falls, even ones that seem minor. ? You feel dizzy, sleepy, or off-balance.  Take over-the-counter and prescription medicines only as told by your health care provider. These include any supplements.  Eat a healthy diet and maintain a healthy weight. A healthy diet includes  low-fat dairy products, low-fat (lean) meats, and fiber from whole grains, beans, and lots of fruits and vegetables. Home safety  Remove any tripping hazards, such as rugs, cords, and clutter.  Install safety equipment such as grab bars in bathrooms and safety rails on stairs.  Keep rooms and walkways well-lit. Activity   Follow a regular exercise program to stay fit. This will help you maintain your balance. Ask your health care provider what types of exercise are appropriate for you.  If you need a cane or walker, use it as recommended by your health care provider.  Wear supportive shoes that have nonskid soles. Lifestyle  Do not drink alcohol if your health care provider tells you not to drink.  If you drink alcohol, limit how much you have: ? 0-1 drink a day for women. ? 0-2 drinks a day for men.  Be aware of how much alcohol is in your drink. In the [place] one drink equals one typical bottle of beer (12 oz), one-half glass of wine (5 oz), or one shot of hard liquor (1 oz).  Do not use any products that contain nicotine or tobacco, such as cigarettes and e-cigarettes. If you need help quitting, ask your health care provider. Summary  Having a healthy lifestyle and getting preventive care can help to protect your health and wellness after age 39.  Screening and testing are the best way to find a health problem early and help you avoid having a fall. Early diagnosis and treatment give you the best chance for managing medical conditions that are more common for people who are older than age 42.  Falls are a major cause of broken bones and head injuries in people who are older than age 80. Take precautions to prevent a fall at home.  Work with your health care provider to learn what changes you can make to improve your health and wellness and to prevent falls. This information is not intended to replace advice given to you by your health care provider. Make sure you discuss any  questions you have with your health care provider. Document Released: 04/26/2017 Document Revised: 04/26/2017 Document Reviewed: 04/26/2017 Elsevier Interactive Patient Education  2019 [employer].

## 2018-11-12 NOTE — Progress Notes (Signed)
Reviewed  Deshay Kirstein R Lowne Chase, DO  

## 2018-11-23 ENCOUNTER — Other Ambulatory Visit: Payer: Self-pay

## 2018-11-23 ENCOUNTER — Ambulatory Visit (HOSPITAL_BASED_OUTPATIENT_CLINIC_OR_DEPARTMENT_OTHER)
Admission: RE | Admit: 2018-11-23 | Discharge: 2018-11-23 | Disposition: A | Discharge status: Home / Self Care | Payer: 59 | Source: Ambulatory Visit | Attending: Family Medicine | Admitting: Family Medicine

## 2018-11-23 DIAGNOSIS — M8588 Other specified disorders of bone density and structure, other site: Secondary | ICD-10-CM | POA: Diagnosis not present

## 2018-11-23 DIAGNOSIS — Z780 Asymptomatic menopausal state: Secondary | ICD-10-CM | POA: Diagnosis not present

## 2018-12-19 ENCOUNTER — Other Ambulatory Visit: Payer: Self-pay | Admitting: Hematology and Oncology

## 2018-12-19 DIAGNOSIS — I4819 Other persistent atrial fibrillation: Secondary | ICD-10-CM

## 2019-01-28 ENCOUNTER — Other Ambulatory Visit: Payer: Self-pay | Admitting: Family Medicine

## 2019-01-28 DIAGNOSIS — E785 Hyperlipidemia, unspecified: Secondary | ICD-10-CM

## 2019-02-04 ENCOUNTER — Other Ambulatory Visit: Payer: Self-pay | Admitting: Family Medicine

## 2019-02-04 DIAGNOSIS — E785 Hyperlipidemia, unspecified: Secondary | ICD-10-CM

## 2019-03-18 ENCOUNTER — Other Ambulatory Visit: Payer: Self-pay | Admitting: Family Medicine

## 2019-03-18 ENCOUNTER — Other Ambulatory Visit: Payer: Self-pay | Admitting: Hematology and Oncology

## 2019-03-18 DIAGNOSIS — I4819 Other persistent atrial fibrillation: Secondary | ICD-10-CM

## 2019-04-02 DIAGNOSIS — Z85820 Personal history of malignant melanoma of skin: Secondary | ICD-10-CM | POA: Diagnosis not present

## 2019-04-02 DIAGNOSIS — Z1283 Encounter for screening for malignant neoplasm of skin: Secondary | ICD-10-CM | POA: Diagnosis not present

## 2019-04-02 DIAGNOSIS — Z08 Encounter for follow-up examination after completed treatment for malignant neoplasm: Secondary | ICD-10-CM | POA: Diagnosis not present

## 2019-04-02 DIAGNOSIS — D225 Melanocytic nevi of trunk: Secondary | ICD-10-CM | POA: Diagnosis not present

## 2019-04-04 ENCOUNTER — Other Ambulatory Visit: Payer: Self-pay

## 2019-04-05 ENCOUNTER — Encounter: Payer: Self-pay | Admitting: Family Medicine

## 2019-04-05 ENCOUNTER — Ambulatory Visit (INDEPENDENT_AMBULATORY_CARE_PROVIDER_SITE_OTHER): Payer: Medicare PPO | Admitting: Family Medicine

## 2019-04-05 ENCOUNTER — Other Ambulatory Visit: Payer: Self-pay

## 2019-04-05 VITALS — BP 182/98 | HR 85 | Temp 97.2°F | Resp 16 | Ht 61.0 in | Wt 160.0 lb

## 2019-04-05 DIAGNOSIS — I10 Essential (primary) hypertension: Secondary | ICD-10-CM

## 2019-04-05 DIAGNOSIS — R2689 Other abnormalities of gait and mobility: Secondary | ICD-10-CM | POA: Diagnosis not present

## 2019-04-05 DIAGNOSIS — E785 Hyperlipidemia, unspecified: Secondary | ICD-10-CM | POA: Diagnosis not present

## 2019-04-05 LAB — LIPID PANEL
Cholesterol: 143 mg/dL (ref 0–200)
HDL: 63.3 mg/dL (ref >39.00)
LDL Cholesterol: 61 mg/dL (ref 0–99)
NonHDL: 80
Total CHOL/HDL Ratio: 2
Triglycerides: 97 mg/dL (ref 0.0–149.0)
VLDL: 19.4 mg/dL (ref 0.0–40.0)

## 2019-04-05 LAB — COMPREHENSIVE METABOLIC PANEL
ALT: 19 U/L (ref 0–35)
AST: 18 U/L (ref 0–37)
Albumin: 4.4 g/dL (ref 3.5–5.2)
Alkaline Phosphatase: 56 U/L (ref 39–117)
BUN: 15 mg/dL (ref 6–23)
CO2: 33 mEq/L — ABNORMAL HIGH (ref 19–32)
Calcium: 10.9 mg/dL — ABNORMAL HIGH (ref 8.4–10.5)
Chloride: 102 mEq/L (ref 96–112)
Creatinine, Ser: 0.73 mg/dL (ref 0.40–1.20)
GFR: 75.53 mL/min (ref >60.00)
Glucose, Bld: 98 mg/dL (ref 70–99)
Potassium: 4.1 mEq/L (ref 3.5–5.1)
Sodium: 141 mEq/L (ref 135–145)
Total Bilirubin: 0.6 mg/dL (ref 0.2–1.2)
Total Protein: 6.4 g/dL (ref 6.0–8.3)

## 2019-04-05 MED ORDER — PRAVASTATIN SODIUM 40 MG PO TABS: 40.0000 mg | ORAL_TABLET | Freq: Every day | ORAL | 1 refills | Status: DC

## 2019-04-05 NOTE — Patient Instructions (Addendum)

## 2019-04-05 NOTE — Progress Notes (Signed)
Patient ID: Jessica Mullins, female    DOB: February 26, 1931  Age: 85 y.o. MRN: [id number]    Subjective:  Subjective  HPI Jessica Mullins presents for f/u bp and chol.  Her only complaints is her balance problem -- she has been to pt with no help.   She has also been to neuro for this.   She admits to not doing the exercises that PT gave her.   bp up today but she is very upset because her computer at home is acting up-- she was on the phone for 2 hours with support and it is still not fixed  bp at home as low at 118/78 per pt reading --- she is just very upset Repeat bp was just a little bit lower  Pt has no other complaints.   Review of Systems  Constitutional: Negative for appetite change, diaphoresis, fatigue and unexpected weight change.  Eyes: Negative for pain, redness and visual disturbance.  Respiratory: Negative for cough, chest tightness, shortness of breath and wheezing.   Cardiovascular: Negative for chest pain, palpitations and leg swelling.  Endocrine: Negative for cold intolerance, heat intolerance, polydipsia, polyphagia and polyuria.  Genitourinary: Negative for difficulty urinating, dysuria and frequency.  Neurological: Negative for dizziness, light-headedness, numbness and headaches.    History Past Medical History:  Diagnosis Date   Allergy    Ankle fracture, left    Breast cancer ([place]) 1115/16   left    Bursitis of right shoulder    Cataract    Family history of cancer    Fracture of right wrist    GERD (gastroesophageal reflux disease)    Hyperlipidemia    Hypertension    Skin cancer 2000   melanoma and basal cell   Vaso vagal episode    during preparation for colonoscopy    She has a past surgical history that includes Appendectomy; Ganglion cyst excision; Breast lumpectomy; Lipiflow procedure; Cataract extraction (2015); Abdominal hysterectomy (1988); fiberadenoma (Bilateral, 1978, 1980); Colonoscopy (2010); DG  BONE DENSITY ([place] HX);  Eye surgery (Bilateral); and Breast lumpectomy with radioactive seed and sentinel lymph node biopsy (Left, 06/11/2015).   Her family history includes Cancer in her mother; Coronary artery disease in her brother; Diabetes in her brother and maternal aunt; Heart disease in her brother; Heart disease (age of onset: 73) in her father; Hyperlipidemia in her brother; Hypertension in her brother; Lung cancer in an other family member; Lung cancer (age of onset: 54) in her sister; Thrombosis in an other family member.She reports that she has never smoked. She has never used smokeless tobacco. She reports that she does not drink alcohol or use drugs.  Current Outpatient Medications on File Prior to Visit  Medication Sig Dispense Refill   alendronate (FOSAMAX) 70 MG tablet TAKE ONE TABLET BY MOUTH EVERY SEVEN DAYS. TAKE WITH GLASS OF WATER AT NOON ON ASSIGNED DAY ON EMPTY STOMACH 4 tablet 4   Biotin (BIOTIN 5000) 5 MG CAPS Take 5 mg by mouth daily.      Calcium Carbonate-Vitamin D (CALTRATE 600+D) 600-400 MG-UNIT per tablet Take 1 tablet by mouth daily.      Cholecalciferol (VITAMIN D3) 1000 UNITS CAPS Take 1 capsule by mouth daily.       cycloSPORINE (RESTASIS) 0.05 % ophthalmic emulsion PLACE 1 DROP INTO BOTH EYES TWO (TWO) TIMES DAILY.     diltiazem (CARDIZEM CD) 180 MG 24 hr capsule TAKE 1 CAPSULE (180 MG TOTAL) BY MOUTH DAILY. 90 capsule 3  ELIQUIS 5 MG TABS tablet TAKE ONE TABLET BY MOUTH TWICE A DAY 180 tablet 0   flecainide (TAMBOCOR) 50 MG tablet TAKE ONE TABLET BY MOUTH TWICE A DAY 60 tablet 11   ketoconazole (NIZORAL) 2 % cream Apply 1 application topically daily as needed for irritation.      Multiple Vitamins-Minerals (OCUVITE ADULT 50+) CAPS Take 1 capsule by mouth daily.     Omega-3 Fatty Acids (FISH OIL PO) Take 1 tablet by mouth daily.      polyethylene glycol (MIRALAX / GLYCOLAX) packet Take 17 g by mouth daily as needed.     Propylene Glycol 0.6 % SOLN Apply 1 drop to eye  daily as needed (dry eyes).      Psyllium (METAMUCIL PO) Take 15 mLs by mouth daily.      Turmeric 500 MG CAPS Take 1 capsule by mouth daily.      vitamin B-12 (CYANOCOBALAMIN) 1000 MCG tablet Take 1 tablet (1,000 mcg total) by mouth daily.     No current facility-administered medications on file prior to visit.      Objective:  Objective  Physical Exam Vitals signs and nursing note reviewed.  Constitutional:      Appearance: She is well-developed.  HENT:     Head: Normocephalic and atraumatic.  Eyes:     Conjunctiva/sclera: Conjunctivae normal.  Neck:     Musculoskeletal: Normal range of motion and neck supple.     Thyroid: No thyromegaly.     Vascular: No carotid bruit or JVD.  Cardiovascular:     Rate and Rhythm: Normal rate and regular rhythm.     Heart sounds: Normal heart sounds. No murmur.  Pulmonary:     Effort: Pulmonary effort is normal. No respiratory distress.     Breath sounds: Normal breath sounds. No wheezing or rales.  Chest:     Chest wall: No tenderness.  Neurological:     Mental Status: She is alert and oriented to person, place, and time.    BP (!) 182/98 (BP Location: Left Arm, Patient Position: Sitting, Cuff Size: Normal)    Pulse 85    Temp (!) 97.2 F (36.2 C) (Temporal)    Resp 16    Ht 5\' 1"  (1.549 m)    Wt 160 lb (72.6 kg)    SpO2 98%    BMI 30.23 kg/m  Wt Readings from Last 3 Encounters:  04/05/19 160 lb (72.6 kg)  08/15/18 166 lb 3.2 oz (75.4 kg)  07/19/18 163 lb 8 oz (74.2 kg)     Lab Results  Component Value Date   WBC 8.0 11/16/2017   HGB 14.9 11/16/2017   HCT 43.9 11/16/2017   PLT 250.0 11/16/2017   GLUCOSE 92 11/16/2017   CHOL 141 11/16/2017   TRIG 84.0 11/16/2017   HDL 65.40 11/16/2017   LDLDIRECT 153.4 11/16/2009   LDLCALC 59 11/16/2017   ALT 17 11/16/2017   AST 20 11/16/2017   NA 139 11/16/2017   K 3.9 11/16/2017   CL 102 11/16/2017   CREATININE 0.82 11/16/2017   BUN 13 11/16/2017   CO2 29 11/16/2017   TSH 3.490  11/07/2016   INR 1.20 10/21/2015    Dg Bone Density  Result Date: 11/23/2018 EXAM: DUAL X-RAY ABSORPTIOMETRY (DXA) FOR BONE MINERAL DENSITY IMPRESSION: Jessica Mullins CHASE Your patient Jessica Mullins completed a BMD test on 11/23/2018 using the Bernard (analysis version: 16.SP2) manufactured by [employer]. The following summarizes the results of our evaluation.  PATIENT: Name: Jessica Mullins, Jessica Mullins Patient ID: [id number] Birth Date: 03-27-1931 Height: 61.0 in. Gender: Female Measured: 11/23/2018 Weight: 161.4 lbs. Indications: Advanced Age, Caucasian, Chemotherapy for Cancer, Estrogen Deficiency, History of Osteopenia, Hysterectomy, Oophorectomy ( Bilateral), Post Menopausal, Recurrent Falls Fractures: Treatments: Boniva, Calcium, Fosamax(Alendronate), Vitamin D ASSESSMENT: The BMD measured at AP Spine L1-L3 is 0.976 g/cm2 with a T-score of -1.6. This patient is considered osteopenic according to [facility]) criteria. L-4 was excluded due to degenerative changes. The scan quality is good. Patient did not get a FRAX because she is on Fosamax. Site Region Measured Date Measured Age WHO YA BMD Classification T-score AP Spine L1-L3 11/23/2018 85.9 Osteopenia -1.6 0.976 g/cm2 AP Spine L1-L3 09/16/2016 83.7 Osteopenia -2.0 0.929 g/cm2 DualFemur Total Mean 11/23/2018 85.9 Normal -0.7 0.915 g/cm2 DualFemur Total Mean 09/16/2016 83.7 Normal -0.6 0.926 g/cm2 World Health Organization [facility]) criteria for post-menopausal, Caucasian Women: Normal       T-score at or above -1 SD Osteopenia   T-score between -1 and -2.5 SD Osteoporosis T-score at or below -2.5 SD RECOMMENDATION:1. All patients should optimize calcium and vitamin D intake. 2. Consider FDA-approved medical therapies in postmenopausal women and men aged 91 years and older, based on the following: a. A hip or vertebral(clinical or morphometric) fracture. b. T-Score < -2.5 at the femoral neck or spine after appropriate evaluation to  exclude secondary causes c. Low bone mass (T-score between -1.0 and -2.5 at the femoral neck or spine) and a 10 year probability of a hip fracture >3% or a 10 year probability of major osteoporosis-related fracture > 20% based on the [place]-adapted WHO algorithm d. Clinical judgement and/or patient preferences may indicate treatment for people with 10-year fracture probabilities above or below these levels FOLLOW-UP: Patients with diagnosis of osteoporosis or at high risk for fracture should have regular bone mineral density tests. For patients eligible for Medicare, routine testing is allowed once every 2 years. The testing frequency can be increased to one year for patients who have rapidly progressing disease, those who are receiving or discontinuing medical therapy to restore bone mass, or have additional risk factors. I have reviewed this report, anf agree with the above findings. [facility] Radiology Electronically Signed   By: Lowella Grip III M.D.   On: 11/23/2018 10:15     Assessment & Plan:  Plan  I have changed Jessica Mullins pravastatin. I am also having her maintain her Calcium Carbonate-Vitamin D, Vitamin D3, Omega-3 Fatty Acids (FISH OIL PO), Biotin, Turmeric, Propylene Glycol, Psyllium (METAMUCIL PO), cycloSPORINE, ketoconazole, polyethylene glycol, Ocuvite Adult 50+, vitamin B-12, flecainide, diltiazem, Eliquis, and alendronate.  Meds ordered this encounter  Medications   pravastatin (PRAVACHOL) 40 MG tablet    Sig: Take 1 tablet (40 mg total) by mouth daily.    Dispense:  90 tablet    Refill:  1    Needs f/u appointment for further refills    Problem List Items Addressed This Visit      Unprioritized   Balance disorder    Pt has been to neuro and PT for this She will start doing the exercises given to her  She will consider going back to Neuro       Essential hypertension - Primary    Poorly controlled , encouraged DASH diet, minimize caffeine and obtain adequate  sleep. Report concerning symptoms and follow up as directed and as needed Home readings have been much lower--  Pt is very agitated right now She will retake  it today and call us with the result      Relevant Medications   pravastatin (PRAVACHOL) 40 MG tablet   Other Relevant Orders   Lipid panel   Comprehensive metabolic panel   Hyperlipidemia    Tolerating statin, encouraged heart healthy diet, avoid trans fats, minimize simple carbs and saturated fats. Increase exercise as tolerated      Relevant Medications   pravastatin (PRAVACHOL) 40 MG tablet   Other Relevant Orders   Lipid panel   Comprehensive metabolic panel    Other Visit Diagnoses    Hyperlipidemia LDL goal <100       Relevant Medications   pravastatin (PRAVACHOL) 40 MG tablet      Follow-up: Return in about 6 months (around 10/04/2019), or if symptoms worsen or fail to improve, for annual exam, fasting.  Ann Held, DO

## 2019-04-05 NOTE — Assessment & Plan Note (Signed)
Tolerating statin, encouraged heart healthy diet, avoid trans fats, minimize simple carbs and saturated fats. Increase exercise as tolerated 

## 2019-04-05 NOTE — Assessment & Plan Note (Signed)
Poorly controlled , encouraged DASH diet, minimize caffeine and obtain adequate sleep. Report concerning symptoms and follow up as directed and as needed Home readings have been much lower--  Pt is very agitated right now She will retake it today and call us with the result

## 2019-04-05 NOTE — Assessment & Plan Note (Signed)
Pt has been to neuro and PT for this She will start doing the exercises given to her  She will consider going back to Neuro

## 2019-04-10 ENCOUNTER — Other Ambulatory Visit: Payer: Self-pay | Admitting: Family Medicine

## 2019-05-10 ENCOUNTER — Encounter: Payer: Self-pay | Admitting: Family Medicine

## 2019-05-10 ENCOUNTER — Other Ambulatory Visit: Payer: Self-pay

## 2019-05-10 ENCOUNTER — Ambulatory Visit (INDEPENDENT_AMBULATORY_CARE_PROVIDER_SITE_OTHER): Payer: Medicare PPO | Admitting: Family Medicine

## 2019-05-10 ENCOUNTER — Ambulatory Visit (HOSPITAL_BASED_OUTPATIENT_CLINIC_OR_DEPARTMENT_OTHER)
Admission: RE | Admit: 2019-05-10 | Discharge: 2019-05-10 | Disposition: A | Discharge status: Home / Self Care | Payer: Medicare PPO | Source: Ambulatory Visit | Attending: Family Medicine | Admitting: Family Medicine

## 2019-05-10 VITALS — BP 140/92 | HR 72 | Temp 97.0°F | Resp 18 | Ht 61.0 in | Wt 156.6 lb

## 2019-05-10 DIAGNOSIS — M25551 Pain in right hip: Secondary | ICD-10-CM | POA: Diagnosis not present

## 2019-05-10 DIAGNOSIS — M1611 Unilateral primary osteoarthritis, right hip: Secondary | ICD-10-CM | POA: Diagnosis not present

## 2019-05-10 DIAGNOSIS — M79604 Pain in right leg: Secondary | ICD-10-CM | POA: Insufficient documentation

## 2019-05-10 DIAGNOSIS — R21 Rash and other nonspecific skin eruption: Secondary | ICD-10-CM

## 2019-05-10 DIAGNOSIS — M545 Low back pain: Secondary | ICD-10-CM | POA: Diagnosis not present

## 2019-05-10 MED ORDER — KETOCONAZOLE 2 % EX CREA: 1.0000 "application " | TOPICAL_CREAM | Freq: Every day | CUTANEOUS | 2 refills | Status: DC | PRN

## 2019-05-10 MED ORDER — METHOCARBAMOL 500 MG PO TABS: 500.0000 mg | ORAL_TABLET | Freq: Four times a day (QID) | ORAL | 1 refills | Status: DC

## 2019-05-10 NOTE — Patient Instructions (Signed)
Muscle Strain  A muscle strain is an injury that occurs when a muscle is stretched beyond its normal length. Usually, a small number of muscle fibers are torn when this happens. There are three types of muscle strains. First-degree strains have the least amount of muscle fiber tearing and the least amount of pain. Second-degree and third-degree strains have more tearing and pain.  Usually, recovery from muscle strain takes 1-2 weeks. Complete healing normally takes 5-6 weeks.  What are the causes?  This condition is caused when a sudden, violent force is placed on a muscle and stretches it too far. This may occur with a fall, lifting, or sports.  What increases the risk?  This condition is more likely to develop in athletes and people who are physically active.  What are the signs or symptoms?  Symptoms of this condition include:  · Pain.  · Bruising.  · Swelling.  · Trouble using the muscle.  How is this diagnosed?  This condition is diagnosed based on a physical exam and your medical history. Tests may also be done, including an X-ray, ultrasound, or MRI.  How is this treated?  This condition is initially treated with PRICE therapy. This therapy involves:  · Protecting the muscle from being injured again.  · Resting the injured muscle.  · Icing the injured muscle.  · Applying pressure (compression) to the injured muscle. This may be done with a splint or elastic bandage.  · Raising (elevating) the injured muscle.  Your health care provider may also recommend medicine for pain.  Follow these instructions at home:  If you have a splint:  · Wear the splint as told by your health care provider. Remove it only as told by your health care provider.  · Loosen the splint if your fingers or toes tingle, become numb, or turn cold and blue.  · Keep the splint clean.  · If the splint is not waterproof:  ? Do not let it get wet.  ? Cover it with a watertight covering when you take a bath or a shower.  Managing pain, stiffness,  and swelling    · If directed, put ice on the injured area.  ? If you have a removable splint, remove it as told by your health care provider.  ? Put ice in a plastic bag.  ? Place a towel between your skin and the bag.  ? Leave the ice on for 20 minutes, 2-3 times a day.  · Move your fingers or toes often to avoid stiffness and to lessen swelling.  · Raise (elevate) the injured area above the level of your heart while you are sitting or lying down.  · Wear an elastic bandage as told by your health care provider. Make sure that it is not too tight.  General instructions  · Take over-the-counter and prescription medicines only as told by your health care provider.  · Restrict your activity and rest the injured muscle as told by your health care provider. Gentle movements may be allowed.  · If physical therapy was prescribed, do exercises as told by your health care provider.  · Do not put pressure on any part of the splint until it is fully hardened. This may take several hours.  · Do not use any products that contain nicotine or tobacco, such as cigarettes and e-cigarettes. These can delay bone healing. If you need help quitting, ask your health care provider.  · Ask your health care provider when it   You have more pain or swelling in the injured area. Get help right away if:  You have numbness or tingling or lose a lot of strength in the injured area. Summary  A muscle strain is an injury that occurs when a muscle is stretched beyond its normal length.  This condition is caused when a sudden, violent force is placed on a muscle and stretches it too far.  This condition is initially treated with PRICE therapy, which involves protecting, resting,  icing, compressing, and elevating.  Gentle movements may be allowed. If physical therapy was prescribed, do exercises as told by your health care provider. This information is not intended to replace advice given to you by your health care provider. Make sure you discuss any questions you have with your health care provider. Document Released: 06/13/2005 Document Revised: 05/26/2017 Document Reviewed: 07/20/2016 [employer] Patient Education  2020 [employer]  

## 2019-05-10 NOTE — Progress Notes (Signed)
Patient ID: Jessica Mullins, female    DOB: 02-24-1931  Age: 85 y.o. MRN: [id number]    Subjective:  Subjective  HPI Jessica Mullins presents for R side , hip , knee and thigh pain.  It started after putting Jessica Mullins iron furniture in the shed 1 week ago   Review of Systems  Constitutional: Negative for appetite change, diaphoresis, fatigue and unexpected weight change.  Eyes: Negative for pain, redness and visual disturbance.  Respiratory: Negative for cough, chest tightness, shortness of breath and wheezing.   Cardiovascular: Negative for chest pain, palpitations and leg swelling.  Endocrine: Negative for cold intolerance, heat intolerance, polydipsia, polyphagia and polyuria.  Genitourinary: Negative for difficulty urinating, dysuria and frequency.  Musculoskeletal: Positive for arthralgias and myalgias.  Neurological: Negative for dizziness, light-headedness, numbness and headaches.    History Past Medical History:  Diagnosis Date   Allergy    Ankle fracture, left    Breast cancer ([place]) 1115/16   left    Bursitis of right shoulder    Cataract    Family history of cancer    Fracture of right wrist    GERD (gastroesophageal reflux disease)    Hyperlipidemia    Hypertension    Skin cancer 2000   melanoma and basal cell   Vaso vagal episode    during preparation for colonoscopy    Jessica Mullins has a past surgical history that includes Appendectomy; Ganglion cyst excision; Breast lumpectomy; Lipiflow procedure; Cataract extraction (2015); Abdominal hysterectomy (1988); fiberadenoma (Bilateral, 1978, 1980); Colonoscopy (2010); DG  BONE DENSITY ([place] HX); Eye surgery (Bilateral); and Breast lumpectomy with radioactive seed and sentinel lymph node biopsy (Left, 06/11/2015).   Jessica Mullins family history includes Cancer in Jessica Mullins mother; Coronary artery disease in Jessica Mullins brother; Diabetes in Jessica Mullins brother and maternal aunt; Heart disease in Jessica Mullins brother; Heart disease (age of onset: 28) in Jessica Mullins  father; Hyperlipidemia in Jessica Mullins brother; Hypertension in Jessica Mullins brother; Lung cancer in an other family member; Lung cancer (age of onset: 2) in Jessica Mullins sister; Thrombosis in an other family member.Jessica Mullins reports that Jessica Mullins has never smoked. Jessica Mullins has never used smokeless tobacco. Jessica Mullins reports that Jessica Mullins does not drink alcohol or use drugs.  Current Outpatient Medications on File Prior to Visit  Medication Sig Dispense Refill   alendronate (FOSAMAX) 70 MG tablet TAKE ONE TABLET BY MOUTH EVERY SEVEN DAYS. TAKE WITH GLASS OF WATER AT NOON ON ASSIGNED DAY ON EMPTY STOMACH 4 tablet 4   Biotin (BIOTIN 5000) 5 MG CAPS Take 5 mg by mouth daily.      Calcium Carbonate-Vitamin D (CALTRATE 600+D) 600-400 MG-UNIT per tablet Take 1 tablet by mouth daily.      Cholecalciferol (VITAMIN D3) 1000 UNITS CAPS Take 1 capsule by mouth daily.       cycloSPORINE (RESTASIS) 0.05 % ophthalmic emulsion PLACE 1 DROP INTO BOTH EYES TWO (TWO) TIMES DAILY.     diltiazem (CARDIZEM CD) 180 MG 24 hr capsule TAKE 1 CAPSULE (180 MG TOTAL) BY MOUTH DAILY. 90 capsule 3   ELIQUIS 5 MG TABS tablet TAKE ONE TABLET BY MOUTH TWICE A DAY 180 tablet 0   flecainide (TAMBOCOR) 50 MG tablet TAKE ONE TABLET BY MOUTH TWICE A DAY 60 tablet 11   Multiple Vitamins-Minerals (OCUVITE ADULT 50+) CAPS Take 1 capsule by mouth daily.     Omega-3 Fatty Acids (FISH OIL PO) Take 1 tablet by mouth daily.      polyethylene glycol (MIRALAX / GLYCOLAX) packet Take 17 g by mouth  daily as needed.     pravastatin (PRAVACHOL) 40 MG tablet Take 1 tablet (40 mg total) by mouth daily. 90 tablet 1   Propylene Glycol 0.6 % SOLN Apply 1 drop to eye daily as needed (dry eyes).      Psyllium (METAMUCIL PO) Take 15 mLs by mouth daily.      Turmeric 500 MG CAPS Take 1 capsule by mouth daily.      vitamin B-12 (CYANOCOBALAMIN) 1000 MCG tablet Take 1 tablet (1,000 mcg total) by mouth daily.     No current facility-administered medications on file prior to visit.        Objective:  Objective  Physical Exam Vitals signs and nursing note reviewed.  Constitutional:      Appearance: Jessica Mullins is well-developed.  HENT:     Head: Normocephalic and atraumatic.  Eyes:     Conjunctiva/sclera: Conjunctivae normal.  Neck:     Musculoskeletal: Normal range of motion and neck supple.     Thyroid: No thyromegaly.     Vascular: No carotid bruit or JVD.  Cardiovascular:     Rate and Rhythm: Normal rate and regular rhythm.     Heart sounds: Normal heart sounds. No murmur.  Pulmonary:     Effort: Pulmonary effort is normal. No respiratory distress.     Breath sounds: Normal breath sounds. No wheezing or rales.  Chest:     Chest wall: No tenderness.  Musculoskeletal:        General: No swelling, tenderness, deformity or signs of injury.     Right lower leg: No edema.     Left lower leg: No edema.  Neurological:     Mental Status: Jessica Mullins is alert and oriented to person, place, and time.     Comments: 4/5 strength R leg/ hip --- pt states no pain now  No pain with palpation L side normal strength     BP (!) 140/92 (BP Location: Right Arm, Patient Position: Sitting, Cuff Size: Normal)    Pulse 72    Temp (!) 97 F (36.1 C) (Temporal)    Resp 18    Ht 5\' 1"  (1.549 m)    Wt 156 lb 9.6 oz (71 kg)    SpO2 95%    BMI 29.59 kg/m  Wt Readings from Last 3 Encounters:  05/10/19 156 lb 9.6 oz (71 kg)  04/05/19 160 lb (72.6 kg)  08/15/18 166 lb 3.2 oz (75.4 kg)     Lab Results  Component Value Date   WBC 8.0 11/16/2017   HGB 14.9 11/16/2017   HCT 43.9 11/16/2017   PLT 250.0 11/16/2017   GLUCOSE 98 04/05/2019   CHOL 143 04/05/2019   TRIG 97.0 04/05/2019   HDL 63.30 04/05/2019   LDLDIRECT 153.4 11/16/2009   LDLCALC 61 04/05/2019   ALT 19 04/05/2019   AST 18 04/05/2019   NA 141 04/05/2019   K 4.1 04/05/2019   CL 102 04/05/2019   CREATININE 0.73 04/05/2019   BUN 15 04/05/2019   CO2 33 (H) 04/05/2019   TSH 3.490 11/07/2016   INR 1.20 10/21/2015    Dg Bone  Density  Result Date: 11/23/2018 EXAM: DUAL X-RAY ABSORPTIOMETRY (DXA) FOR BONE MINERAL DENSITY IMPRESSION: [facility] [place] Your patient Noam Jacksonwald completed a BMD test on 11/23/2018 using the [place] (analysis version: 16.SP2) manufactured by [employer]. The following summarizes the results of our evaluation. PATIENT: Name: Aania, Bennette Patient ID: [id number] Birth Date: 1931-01-04 Height: 61.0 in.  Gender: Female Measured: 11/23/2018 Weight: 161.4 lbs. Indications: Advanced Age, Caucasian, Chemotherapy for Cancer, Estrogen Deficiency, History of Osteopenia, Hysterectomy, Oophorectomy ( Bilateral), Post Menopausal, Recurrent Falls Fractures: Treatments: Boniva, Calcium, Fosamax(Alendronate), Vitamin D ASSESSMENT: The BMD measured at AP Spine L1-L3 is 0.976 g/cm2 with a T-score of -1.6. This patient is considered osteopenic according to [facility]) criteria. L-4 was excluded due to degenerative changes. The scan quality is good. Patient did not get a FRAX because Jessica Mullins is on Fosamax. Site Region Measured Date Measured Age WHO YA BMD Classification T-score AP Spine L1-L3 11/23/2018 85.9 Osteopenia -1.6 0.976 g/cm2 AP Spine L1-L3 09/16/2016 83.7 Osteopenia -2.0 0.929 g/cm2 DualFemur Total Mean 11/23/2018 85.9 Normal -0.7 0.915 g/cm2 DualFemur Total Mean 09/16/2016 83.7 Normal -0.6 0.926 g/cm2 World Health Organization [facility]) criteria for post-menopausal, Caucasian Women: Normal       T-score at or above -1 SD Osteopenia   T-score between -1 and -2.5 SD Osteoporosis T-score at or below -2.5 SD RECOMMENDATION:1. All patients should optimize calcium and vitamin D intake. 2. Consider FDA-approved medical therapies in postmenopausal women and men aged 33 years and older, based on the following: a. A hip or vertebral(clinical or morphometric) fracture. b. T-Score < -2.5 at the femoral neck or spine after appropriate evaluation to exclude secondary causes c. Low bone mass  (T-score between -1.0 and -2.5 at the femoral neck or spine) and a 10 year probability of a hip fracture >3% or a 10 year probability of major osteoporosis-related fracture > 20% based on the [place]-adapted WHO algorithm d. Clinical judgement and/or patient preferences may indicate treatment for people with 10-year fracture probabilities above or below these levels FOLLOW-UP: Patients with diagnosis of osteoporosis or at high risk for fracture should have regular bone mineral density tests. For patients eligible for Medicare, routine testing is allowed once every 2 years. The testing frequency can be increased to one year for patients who have rapidly progressing disease, those who are receiving or discontinuing medical therapy to restore bone mass, or have additional risk factors. I have reviewed this report, anf agree with the above findings. [facility] Radiology Electronically Signed   By: Lowella Grip III M.D.   On: 11/23/2018 10:15     Assessment & Plan:  Plan  I am having Brindy L. Lamarche start on methocarbamol. I am also having Jessica Mullins maintain Jessica Mullins Calcium Carbonate-Vitamin D, Vitamin D3, Omega-3 Fatty Acids (FISH OIL PO), Biotin, Turmeric, Propylene Glycol, Psyllium (METAMUCIL PO), cycloSPORINE, polyethylene glycol, Ocuvite Adult 50+, vitamin B-12, flecainide, diltiazem, Eliquis, alendronate, pravastatin, and ketoconazole.  Meds ordered this encounter  Medications   ketoconazole (NIZORAL) 2 % cream    Sig: Apply 1 application topically daily as needed for irritation.    Dispense:  15 g    Refill:  2   methocarbamol (ROBAXIN) 500 MG tablet    Sig: Take 1 tablet (500 mg total) by mouth 4 (four) times daily.    Dispense:  45 tablet    Refill:  1    Problem List Items Addressed This Visit    None    Visit Diagnoses    Rash    -  Primary   Relevant Medications   ketoconazole (NIZORAL) 2 % cream   Right leg pain       Relevant Medications   methocarbamol (ROBAXIN) 500 MG tablet   Other  Relevant Orders   DG Lumbar Spine Complete   DG Hip Unilat W OR W/O Pelvis 2-3 Views Right  Right hip pain       Relevant Medications   methocarbamol (ROBAXIN) 500 MG tablet   Other Relevant Orders   DG Hip Unilat W OR W/O Pelvis 2-3 Views Right      Follow-up: Return if symptoms worsen or fail to improve.  Ann Held, DO

## 2019-05-13 ENCOUNTER — Telehealth: Payer: Self-pay | Admitting: Family Medicine

## 2019-05-13 DIAGNOSIS — M79604 Pain in right leg: Secondary | ICD-10-CM

## 2019-05-13 NOTE — Telephone Encounter (Signed)
Patient notified of results and directions of medication.  She stated that she started taking the Robaxin on Saturday and she is still having pain on both sides of her right knee.  Any recommendations on what do for pain?  She stated that her lower back feels fine, its just her right knee bothering her.

## 2019-05-13 NOTE — Telephone Encounter (Signed)
Can refer to sport med I can sent in ultram but it may make her sleepy

## 2019-05-13 NOTE — Telephone Encounter (Signed)
Patient called and would like to talk to Dr. Etter Sjogren or her CMA about the medication methocarbamol (ROBAXIN) 500 MG tablet she is taking and how she is suppose to be taking. She also needs to talk to her about her x-ray results and in her notes saying that patient needs a MRI. Please call patient back, thanks.

## 2019-05-14 ENCOUNTER — Other Ambulatory Visit: Payer: Self-pay | Admitting: Family Medicine

## 2019-05-14 DIAGNOSIS — M79606 Pain in leg, unspecified: Secondary | ICD-10-CM

## 2019-05-14 MED ORDER — TRAMADOL HCL 50 MG PO TABS: 50.0000 mg | ORAL_TABLET | Freq: Three times a day (TID) | ORAL | 0 refills | Status: AC | PRN

## 2019-05-14 NOTE — Telephone Encounter (Signed)
Tramadol sent in 

## 2019-05-14 NOTE — Telephone Encounter (Signed)
Patient notified that rx has been sent in. 

## 2019-05-14 NOTE — Telephone Encounter (Signed)
Patient would like to try pain medication.  She will see if this will calm things down and if not she will call us to send referral for sports med.  Can you please send in tramadol please.

## 2019-05-14 NOTE — Progress Notes (Signed)
ultram

## 2019-05-16 DIAGNOSIS — Z853 Personal history of malignant neoplasm of breast: Secondary | ICD-10-CM | POA: Diagnosis not present

## 2019-05-16 DIAGNOSIS — N6324 Unspecified lump in the left breast, lower inner quadrant: Secondary | ICD-10-CM | POA: Diagnosis not present

## 2019-05-16 LAB — HM MAMMOGRAPHY

## 2019-05-16 NOTE — Telephone Encounter (Signed)
Patient advised to stop tramadol and get solonpas patches.  Referral made to sports med.

## 2019-05-16 NOTE — Telephone Encounter (Signed)
Is there anything else she can try or just get her over to sports med?

## 2019-05-16 NOTE — Telephone Encounter (Signed)
She can try salonpas with lidocaine patches otc---- an refer to sport med at [facility]

## 2019-05-16 NOTE — Telephone Encounter (Signed)
Pt called stating the tramadol caused her to be dizzy, hands tremble. Please advise.

## 2019-05-17 ENCOUNTER — Telehealth: Payer: Self-pay | Admitting: Family Medicine

## 2019-05-17 NOTE — Telephone Encounter (Signed)
Patient notified that referral has been placed for her.

## 2019-05-17 NOTE — Telephone Encounter (Signed)
Referral Request - Has patient seen PCP for this complaint? Yes.   *If NO, is insurance requiring patient see PCP for this issue before PCP can refer them? Referral for which specialty: Sports Medicine Preferred provider/office: any Reason for referral: knee pain

## 2019-05-20 ENCOUNTER — Other Ambulatory Visit: Payer: Self-pay

## 2019-05-21 ENCOUNTER — Ambulatory Visit (INDEPENDENT_AMBULATORY_CARE_PROVIDER_SITE_OTHER): Payer: Medicare PPO | Admitting: Family Medicine

## 2019-05-21 ENCOUNTER — Encounter: Payer: Self-pay | Admitting: Family Medicine

## 2019-05-21 VITALS — BP 128/82 | HR 66 | Temp 97.3°F | Ht 61.0 in | Wt 157.0 lb

## 2019-05-21 DIAGNOSIS — M5416 Radiculopathy, lumbar region: Secondary | ICD-10-CM | POA: Diagnosis not present

## 2019-05-21 DIAGNOSIS — M7061 Trochanteric bursitis, right hip: Secondary | ICD-10-CM

## 2019-05-21 DIAGNOSIS — M79604 Pain in right leg: Secondary | ICD-10-CM

## 2019-05-21 MED ORDER — AMBULATORY NON FORMULARY MEDICATION: 0 refills | Status: DC

## 2019-05-21 MED ORDER — GABAPENTIN 300 MG PO CAPS: 300.0000 mg | ORAL_CAPSULE | Freq: Every evening | ORAL | 3 refills | Status: DC | PRN

## 2019-05-21 MED ORDER — PREDNISONE 10 MG PO TABS: 30.0000 mg | ORAL_TABLET | Freq: Every day | ORAL | 0 refills | Status: DC

## 2019-05-21 NOTE — Progress Notes (Signed)
Subjective:    I'm seeing this patient as a consultation for:  Ann Held, DO   CC: Right leg pain  HPI: Patient developed right-sided hip knee and thigh pain starting in early November around the sixth.  Pain started after she was putting some heavy furniture away in a shed.  She was seen by her primary care provider on November 13.  Patient was treated with initially methocarbamol which was not immediately effective and was switched to Ultram which caused dizziness and trembling hands she was advised to stop the tramadol and try lidocaine patches.  She was ultimately referred to me for further evaluation and management.   Past medical history, Surgical history, Family history not pertinant except as noted below, Social history, Allergies, and medications have been entered into the medical record, reviewed, and no changes needed.   Review of Systems: No headache, visual changes, nausea, vomiting, diarrhea, constipation, dizziness, abdominal pain, skin rash, fevers, chills, night sweats, weight loss, swollen lymph nodes, body aches, joint swelling, muscle aches, chest pain, shortness of breath, mood changes, visual or auditory hallucinations.   Objective:    Vitals:   05/21/19 1118  BP: 128/82  Pulse: 66  Temp: (!) 97.3 F (36.3 C)  SpO2: 99%   General: Well Developed, well nourished, and in no acute distress.  Neuro/Psych: Alert and oriented x3, extra-ocular muscles intact, able to move all 4 extremities, sensation grossly intact. Skin: Warm and dry, no rashes noted.  Respiratory: Not using accessory muscles, speaking in full sentences, trachea midline.  Cardiovascular: Pulses palpable, no extremity edema. Abdomen: Does not appear distended. MSK: L-spine: Nontender to spinal midline.  Range of motion intact extension rotation lateral flexion.  Limited flexion. Positive right-sided straight leg raise test. Strength reduced hip abduction 3/5 slightly limited to hip  flexion as well. Otherwise normal bilateral extremities.  Reflexes equal and intact bilateral lower extremities.  Right hip normal-appearing normal motion. Tender palpation greater trochanter. Hip abduction strength diminished 3/5.  Left hip normal-appearing.  Normal motion.  Not particularly tender.  Normal strength.  Mild antalgic gait.  Lab and Radiology Results Dg Lumbar Spine Complete  Result Date: 05/10/2019 CLINICAL DATA:  Low back pain after moving furniture 1 month ago. EXAM: LUMBAR SPINE - COMPLETE 4+ VIEW COMPARISON:  None. FINDINGS: Minimal grade 1 anterolisthesis is noted at L4-5 and L5-S1 secondary to posterior facet joint hypertrophy. Severe degenerative disc disease is noted at these levels as well. Diffuse osteopenia is noted. No definite fracture is noted. IMPRESSION: Severe multilevel degenerative disc disease. No acute abnormality seen in the lumbar spine. Electronically Signed   By: Marijo Conception M.D.   On: 05/10/2019 16:53   Dg Hip Unilat W Or W/o Pelvis 2-3 Views Right  Result Date: 05/10/2019 CLINICAL DATA:  Right hip pain after moving furniture 1 week ago. EXAM: DG HIP (WITH OR WITHOUT PELVIS) 2-3V RIGHT COMPARISON:  April 27, 2009. FINDINGS: There is no evidence of hip fracture or dislocation. Mild narrowing of right hip joint is noted. IMPRESSION: Mild degenerative joint disease of right hip is noted. No acute abnormality is noted. Electronically Signed   By: Marijo Conception M.D.   On: 05/10/2019 16:51   I, Lynne Leader, personally (independently) visualized and performed the interpretation of the images attached in this note.   Impression and Recommendations:    Assessment and Plan: 85 y.o. female with  Right leg pain.  Patient has pain in the anterior thigh and lateral hip  and thigh.  I believe the lateral hip pain is hip abductor tendinopathy/greater trochanteric bursitis.  I believe the pain in the anterior thigh is lumbar radiculopathy at L3 or L4  nerve root.   Discussed treatment plan and option. Plan for oral steroid burst and trial of gabapentin for the lumbar radiculopathy.  Will refer to physical therapy for management of hip abductor tendinopathy/trochanteric bursitis.  Check back in 1 month.  Return sooner if needed.  Precautions reviewed.  Additionally wrote a prescription for a walker.  This should help reduce and offload some of her hip to make walking more comfortable and make it less likely for her fall. PDMP not reviewed this encounter. Orders Placed This Encounter  Procedures  . Ambulatory referral to Physical Therapy    Referral Priority:   Routine    Referral Type:   Physical Medicine    Referral Reason:   Specialty Services Required    Requested Specialty:   Physical Therapy   Meds ordered this encounter  Medications  . gabapentin (NEURONTIN) 300 MG capsule    Sig: Take 1 capsule (300 mg total) by mouth at bedtime as needed (leg pain).    Dispense:  90 capsule    Refill:  3  . predniSONE (DELTASONE) 10 MG tablet    Sig: Take 3 tablets (30 mg total) by mouth daily with breakfast.    Dispense:  15 tablet    Refill:  0  . AMBULATORY NON FORMULARY MEDICATION    Sig: Walker use daily as directed.  Disp 1 Leg pain M79.604    Dispense:  1 each    Refill:  0    Discussed warning signs or symptoms. Please see discharge instructions. Patient expresses understanding.

## 2019-05-21 NOTE — Patient Instructions (Addendum)
Thank you for coming in today. Attend PT.  Take the prednisone for 5 days.  Take gabapentin at bedtime as needed for nerve pain in leg.  Recheck in 4 weeks.  Return or contact me sooner if not doing well.    Hip Bursitis  Hip bursitis is swelling of a fluid-filled sac (bursa) in your hip joint. This swelling (inflammation) can be painful. This condition may come and go over time. What are the causes?  Injury to the hip.  Overuse of the muscles that surround the hip joint.  An earlier injury or surgery of the hip.  Arthritis or gout.  Diabetes.  Thyroid disease.  Infection.  In some cases, the cause may not be known. What are the signs or symptoms?  Mild or moderate pain in the hip area. Pain may get worse with movement.  Tenderness and swelling of the hip, especially on the outer side of the hip.  In rare cases, the bursa may become infected. This may cause: ? A fever. ? Warmth and redness in the area. Symptoms may come and go. How is this treated? This condition is treated by resting, icing, applying pressure (compression), and raising (elevating) the injured area. You may hear this called the RICE treatment. Treatment may also include:  Using crutches.  Draining fluid out of the bursa to help relieve swelling.  Giving a shot of (injecting) medicine that helps to reduce swelling (cortisone).  Other medicines if the bursa is infected. Follow these instructions at home: Managing pain, stiffness, and swelling   If told, put ice on the painful area. ? Put ice in a plastic bag. ? Place a towel between your skin and the bag. ? Leave the ice on for 20 minutes, 2-3 times a day. ? Raise (elevate) your hip above the level of your heart as much as you can without pain. To do this, try putting a pillow under your hips while you lie down. Stop if this causes pain. Activity  Return to your normal activities as told by your doctor. Ask your doctor what activities are  safe for you.  Rest and protect your hip as much as you can until you feel better. General instructions  Take over-the-counter and prescription medicines only as told by your doctor.  Wear wraps that put pressure on your hip (compression wraps) only as told by your doctor.  Do not use your hip to support your body weight until your doctor says that you can.  Use crutches as told by your doctor.  Gently rub and stretch your injured area as often as is comfortable.  Keep all follow-up visits as told by your doctor. This is important. How is this prevented?  Exercise regularly, as told by your doctor.  Warm up and stretch before being active.  Cool down and stretch after being active.  Avoid activities that bother your hip or cause pain.  Avoid sitting down for long periods at a time. Contact a doctor if:  You have a fever.  You get new symptoms.  You have trouble walking.  You have trouble doing everyday activities.  You have pain that gets worse.  You have pain that does not get better with medicine.  You get red skin on your hip area.  You get a feeling of warmth in your hip area. Get help right away if:  You cannot move your hip.  You have very bad pain. Summary  Hip bursitis is swelling of a fluid-filled sac (bursa)  in your hip.  Hip bursitis can be painful.  Symptoms often come and go over time.  This condition is treated with rest, ice, compression, elevation, and medicines. This information is not intended to replace advice given to you by your health care provider. Make sure you discuss any questions you have with your health care provider. Document Released: 07/16/2010 Document Revised: 02/19/2018 Document Reviewed: 02/19/2018 Elsevier Patient Education  2020 [place].    Sciatica  Sciatica is pain, weakness, tingling, or loss of feeling (numbness) along the sciatic nerve. The sciatic nerve starts in the lower back and goes down the back  of each leg. Sciatica usually goes away on its own or with treatment. Sometimes, sciatica may come back (recur). What are the causes? This condition happens when the sciatic nerve is pinched or has pressure put on it. This may be the result of:  A disk in between the bones of the spine bulging out too far (herniated disk).  Changes in the spinal disks that occur with aging.  A condition that affects a muscle in the butt.  Extra bone growth near the sciatic nerve.  A break (fracture) of the area between your hip bones (pelvis).  Pregnancy.  Tumor. This is rare. What increases the risk? You are more likely to develop this condition if you:  Play sports that put pressure or stress on the spine.  Have poor strength and ease of movement (flexibility).  Have had a back injury in the past.  Have had back surgery.  Sit for long periods of time.  Do activities that involve bending or lifting over and over again.  Are very overweight (obese). What are the signs or symptoms? Symptoms can vary from mild to very bad. They may include:  Any of these problems in the lower back, leg, hip, or butt: ? Mild tingling, loss of feeling, or dull aches. ? Burning sensations. ? Sharp pains.  Loss of feeling in the back of the calf or the sole of the foot.  Leg weakness.  Very bad back pain that makes it hard to move. These symptoms may get worse when you cough, sneeze, or laugh. They may also get worse when you sit or stand for long periods of time. How is this treated? This condition often gets better without any treatment. However, treatment may include:  Changing or cutting back on physical activity when you have pain.  Doing exercises and stretching.  Putting ice or heat on the affected area.  Medicines that help: ? To relieve pain and swelling. ? To relax your muscles.  Shots (injections) of medicines that help to relieve pain, irritation, and swelling.  Surgery. Follow  these instructions at home: Medicines  Take over-the-counter and prescription medicines only as told by your doctor.  Ask your doctor if the medicine prescribed to you: ? Requires you to avoid driving or using heavy machinery. ? Can cause trouble pooping (constipation). You may need to take these steps to prevent or treat trouble pooping:  Drink enough fluids to keep your pee (urine) pale yellow.  Take over-the-counter or prescription medicines.  Eat foods that are high in fiber. These include beans, whole grains, and fresh fruits and vegetables.  Limit foods that are high in fat and sugar. These include fried or sweet foods. Managing pain      If told, put ice on the affected area. ? Put ice in a plastic bag. ? Place a towel between your skin and the bag. ?  Leave the ice on for 20 minutes, 2-3 times a day.  If told, put heat on the affected area. Use the heat source that your doctor tells you to use, such as a moist heat pack or a heating pad. ? Place a towel between your skin and the heat source. ? Leave the heat on for 20-30 minutes. ? Remove the heat if your skin turns bright red. This is very important if you are unable to feel pain, heat, or cold. You may have a greater risk of getting burned. Activity   Return to your normal activities as told by your doctor. Ask your doctor what activities are safe for you.  Avoid activities that make your symptoms worse.  Take short rests during the day. ? When you rest for a long time, do some physical activity or stretching between periods of rest. ? Avoid sitting for a long time without moving. Get up and move around at least one time each hour.  Exercise and stretch regularly, as told by your doctor.  Do not lift anything that is heavier than 10 lb (4.5 kg) while you have symptoms of sciatica. ? Avoid lifting heavy things even when you do not have symptoms. ? Avoid lifting heavy things over and over.  When you lift objects,  always lift in a way that is safe for your body. To do this, you should: ? Bend your knees. ? Keep the object close to your body. ? Avoid twisting. General instructions  Stay at a healthy weight.  Wear comfortable shoes that support your feet. Avoid wearing high heels.  Avoid sleeping on a mattress that is too soft or too hard. You might have less pain if you sleep on a mattress that is firm enough to support your back.  Keep all follow-up visits as told by your doctor. This is important. Contact a doctor if:  You have pain that: ? Wakes you up when you are sleeping. ? Gets worse when you lie down. ? Is worse than the pain you have had in the past. ? Lasts longer than 4 weeks.  You lose weight without trying. Get help right away if:  You cannot control when you pee (urinate) or poop (have a bowel movement).  You have weakness in any of these areas and it gets worse: ? Lower back. ? The area between your hip bones. ? Butt. ? Legs.  You have redness or swelling of your back.  You have a burning feeling when you pee. Summary  Sciatica is pain, weakness, tingling, or loss of feeling (numbness) along the sciatic nerve.  This condition happens when the sciatic nerve is pinched or has pressure put on it.  Sciatica can cause pain, tingling, or loss of feeling (numbness) in the lower back, legs, hips, and butt.  Treatment often includes rest, exercise, medicines, and putting ice or heat on the affected area. This information is not intended to replace advice given to you by your health care provider. Make sure you discuss any questions you have with your health care provider. Document Released: 03/22/2008 Document Revised: 07/02/2018 Document Reviewed: 07/02/2018 Elsevier Patient Education  [place].

## 2019-05-27 DIAGNOSIS — Z7901 Long term (current) use of anticoagulants: Secondary | ICD-10-CM | POA: Diagnosis not present

## 2019-05-27 DIAGNOSIS — E785 Hyperlipidemia, unspecified: Secondary | ICD-10-CM | POA: Diagnosis not present

## 2019-05-27 DIAGNOSIS — D6869 Other thrombophilia: Secondary | ICD-10-CM | POA: Diagnosis not present

## 2019-05-27 DIAGNOSIS — Z809 Family history of malignant neoplasm, unspecified: Secondary | ICD-10-CM | POA: Diagnosis not present

## 2019-05-27 DIAGNOSIS — M810 Age-related osteoporosis without current pathological fracture: Secondary | ICD-10-CM | POA: Diagnosis not present

## 2019-05-27 DIAGNOSIS — Z7983 Long term (current) use of bisphosphonates: Secondary | ICD-10-CM | POA: Diagnosis not present

## 2019-05-27 DIAGNOSIS — I4891 Unspecified atrial fibrillation: Secondary | ICD-10-CM | POA: Diagnosis not present

## 2019-05-27 DIAGNOSIS — H04129 Dry eye syndrome of unspecified lacrimal gland: Secondary | ICD-10-CM | POA: Diagnosis not present

## 2019-05-27 DIAGNOSIS — K5900 Constipation, unspecified: Secondary | ICD-10-CM | POA: Diagnosis not present

## 2019-05-29 ENCOUNTER — Ambulatory Visit (INDEPENDENT_AMBULATORY_CARE_PROVIDER_SITE_OTHER): Payer: Medicare PPO | Admitting: Family Medicine

## 2019-05-29 ENCOUNTER — Ambulatory Visit
Admission: RE | Admit: 2019-05-29 | Discharge: 2019-05-29 | Disposition: A | Discharge status: Home / Self Care | Payer: Medicare PPO | Source: Ambulatory Visit | Attending: Family Medicine | Admitting: Family Medicine

## 2019-05-29 ENCOUNTER — Other Ambulatory Visit: Payer: Self-pay

## 2019-05-29 ENCOUNTER — Ambulatory Visit: Payer: Medicare PPO | Admitting: Physical Therapy

## 2019-05-29 ENCOUNTER — Encounter: Payer: Self-pay | Admitting: Family Medicine

## 2019-05-29 VITALS — BP 158/84 | HR 65 | Ht 61.0 in

## 2019-05-29 DIAGNOSIS — M5416 Radiculopathy, lumbar region: Secondary | ICD-10-CM

## 2019-05-29 DIAGNOSIS — M79604 Pain in right leg: Secondary | ICD-10-CM

## 2019-05-29 DIAGNOSIS — R29898 Other symptoms and signs involving the musculoskeletal system: Secondary | ICD-10-CM

## 2019-05-29 DIAGNOSIS — M48061 Spinal stenosis, lumbar region without neurogenic claudication: Secondary | ICD-10-CM | POA: Diagnosis not present

## 2019-05-29 MED ORDER — HYDROCODONE-ACETAMINOPHEN 5-325 MG PO TABS: 1.0000 | ORAL_TABLET | Freq: Four times a day (QID) | ORAL | 0 refills | Status: DC | PRN

## 2019-05-29 NOTE — Progress Notes (Signed)
I, Jessica Mullins, LAT, ATC, am serving as scribe for Dr. Lynne Mullins.  Jessica Mullins is a 85 y.o. female who presents to [facility] today for f/u of R hip/thigh pain that she initially injured in mid-November 2020.  Pt was last seen by Dr. Georgina Snell on 05/21/19 and was prescribed oral prednisone x 5 days and Gabapentin at night.  She was also referred to PT.  Since her last visit, she notes worsening pain.  She notes excruciating pain that is keeping her awake at night.  She states that her pain location moves from the R hip/glute area, into her R thigh and knee.  She states that she is noting progressive and worsening becoming somewhat severe weakness in her R leg and feels like her R leg won't support her.  She denies any recent falls.  She is using a hiking stick as a cane at this time and came into the exam room in a WC.  She does note some tingling into the R LE.  She has finished her course of oral prednisone and reports no change in her symptoms while taking the prednisone. She notes that her friend gave her her husband's old walker.  She has a walker but is not adjusted properly for her height. She also notes that tramadol has caused her to be very jittery in the past.    ROS:  As above  Exam:  BP (!) 158/84 (BP Location: Left Arm, Patient Position: Sitting, Cuff Size: Normal)    Pulse 65    Ht 5\' 1"  (1.549 m)    SpO2 97%    BMI 29.66 kg/m  Wt Readings from Last 5 Encounters:  05/21/19 157 lb (71.2 kg)  05/10/19 156 lb 9.6 oz (71 kg)  04/05/19 160 lb (72.6 kg)  08/15/18 166 lb 3.2 oz (75.4 kg)  07/19/18 163 lb 8 oz (74.2 kg)   General: Well Developed, well nourished, and in no acute distress.  Neuro/Psych: Alert and oriented x3, extra-ocular muscles intact, able to move all 4 extremities, sensation grossly intact. Skin: Warm and dry, no rashes noted.  Respiratory: Not using accessory muscles, speaking in full sentences, trachea midline.  Cardiovascular: Pulses  palpable, no extremity edema. Abdomen: Does not appear distended. MSK:  L-spine nontender to spinal midline.  Decreased motion. Decreased strength to right hip flexion 3/5 and abduction 4/5 Hip adduction intact 5/5. Knee extension decreased 4/5.  Knee flexion normal 5/5. Right foot plantar dorsiflexion normal 5/5. Right hip normal motion.  Nontender Right leg normal-appearing normal sensation.     Lab and Radiology Results Dg Lumbar Spine Complete  Result Date: 05/10/2019 CLINICAL DATA:  Low back pain after moving furniture 1 month ago. EXAM: LUMBAR SPINE - COMPLETE 4+ VIEW COMPARISON:  None. FINDINGS: Minimal grade 1 anterolisthesis is noted at L4-5 and L5-S1 secondary to posterior facet joint hypertrophy. Severe degenerative disc disease is noted at these levels as well. Diffuse osteopenia is noted. No definite fracture is noted. IMPRESSION: Severe multilevel degenerative disc disease. No acute abnormality seen in the lumbar spine. Electronically Signed   By: Marijo Conception M.D.   On: 05/10/2019 16:53   Dg Hip Unilat W Or W/o Pelvis 2-3 Views Right  Result Date: 05/10/2019 CLINICAL DATA:  Right hip pain after moving furniture 1 week ago. EXAM: DG HIP (WITH OR WITHOUT PELVIS) 2-3V RIGHT COMPARISON:  April 27, 2009. FINDINGS: There is no evidence of hip fracture or dislocation. Mild narrowing of right hip joint is  noted. IMPRESSION: Mild degenerative joint disease of right hip is noted. No acute abnormality is noted. Electronically Signed   By: Marijo Conception M.D.   On: 05/10/2019 16:51   I, Jessica Mullins, personally (independently) visualized and performed the interpretation of the images attached in this note.     Assessment and Plan: 85 y.o. female with  Right leg pain associated with new onset significant weakness especially to hip flexion.  Concerning for L2 or L3 lumbar radiculopathy.  Fortunately no bilateral symptoms or new bowel bladder dysfunction.  Given her rapidly  progressive symptoms plan to proceed with stat MRI for potential surgical planning.  Will use hydrocodone for severe pain.  Encouraged use of a walker for safety with ambulation.  She can bring her walker by today and I will be happy to adjust it properly for her height. Following MRI.   PDMP reviewed during this encounter. Orders Placed This Encounter  Procedures   MR Lumbar Spine Wo Contrast    Standing Status:   Future    Standing Expiration Date:   07/29/2020    Order Specific Question:   What is the patient's sedation requirement?    Answer:   No Sedation    Order Specific Question:   Does the patient have a pacemaker or implanted devices?    Answer:   No    Order Specific Question:   Preferred imaging location?    Answer:   [facility] (table limit-550lbs)    Order Specific Question:   Radiology Contrast Protocol - do NOT remove file path    Answer:   [email or web address]   Meds ordered this encounter  Medications   HYDROcodone-acetaminophen (NORCO/VICODIN) 5-325 MG tablet    Sig: Take 1 tablet by mouth every 6 (six) hours as needed.    Dispense:  10 tablet    Refill:  0    Historical information moved to improve visibility of documentation.  Past Medical History:  Diagnosis Date   Allergy    Ankle fracture, left    Breast cancer ([place]) 1115/16   left    Bursitis of right shoulder    Cataract    Family history of cancer    Fracture of right wrist    GERD (gastroesophageal reflux disease)    Hyperlipidemia    Hypertension    Skin cancer 2000   melanoma and basal cell   Vaso vagal episode    during preparation for colonoscopy   Past Surgical History:  Procedure Laterality Date   ABDOMINAL HYSTERECTOMY  1988   TAH/BSO--FIBROIDS   APPENDECTOMY     BREAST LUMPECTOMY     B/L--[facility]   BREAST LUMPECTOMY WITH RADIOACTIVE SEED AND SENTINEL LYMPH NODE BIOPSY Left 06/11/2015   Procedure: LEFT BREAST LUMPECTOMY WITH  RADIOACTIVE SEED AND LEFT SENTINEL LYMPH NODE MAPPING;  Surgeon: Erroll Luna, MD;  Location: [facility] OR;  Service: General;  Laterality: Left;   CATARACT EXTRACTION  2015   COLONOSCOPY  2010   DG  BONE DENSITY ([place] [facility])     EYE SURGERY Bilateral    cataracts   fiberadenoma Bilateral 1978, 1980   GANGLION CYST EXCISION     L  hand   Lipiflow procedure     Social History   Tobacco Use   Smoking status: Never Smoker   Smokeless tobacco: Never Used  Substance Use Topics   Alcohol use: No   family history includes Cancer in her mother; Coronary artery disease in her brother; Diabetes in her  brother and maternal aunt; Heart disease in her brother; Heart disease (age of onset: 17) in her father; Hyperlipidemia in her brother; Hypertension in her brother; Lung cancer in an other family member; Lung cancer (age of onset: 12) in her sister; Thrombosis in an other family member.  Medications: Current Outpatient Medications  Medication Sig Dispense Refill   alendronate (FOSAMAX) 70 MG tablet TAKE ONE TABLET BY MOUTH EVERY SEVEN DAYS. TAKE WITH GLASS OF WATER AT NOON ON ASSIGNED DAY ON EMPTY STOMACH 4 tablet 4   AMBULATORY NON FORMULARY MEDICATION Walker use daily as directed.  Disp 1 Leg pain M79.604 1 each 0   Biotin (BIOTIN 5000) 5 MG CAPS Take 5 mg by mouth daily.      Calcium Carbonate-Vitamin D (CALTRATE 600+D) 600-400 MG-UNIT per tablet Take 1 tablet by mouth daily.      Cholecalciferol (VITAMIN D3) 1000 UNITS CAPS Take 1 capsule by mouth daily.       cycloSPORINE (RESTASIS) 0.05 % ophthalmic emulsion PLACE 1 DROP INTO BOTH EYES TWO (TWO) TIMES DAILY.     diltiazem (CARDIZEM CD) 180 MG 24 hr capsule TAKE 1 CAPSULE (180 MG TOTAL) BY MOUTH DAILY. 90 capsule 3   ELIQUIS 5 MG TABS tablet TAKE ONE TABLET BY MOUTH TWICE A DAY 180 tablet 0   flecainide (TAMBOCOR) 50 MG tablet TAKE ONE TABLET BY MOUTH TWICE A DAY 60 tablet 11   gabapentin (NEURONTIN) 300 MG capsule Take 1  capsule (300 mg total) by mouth at bedtime as needed (leg pain). 90 capsule 3   ketoconazole (NIZORAL) 2 % cream Apply 1 application topically daily as needed for irritation. 15 g 2   methocarbamol (ROBAXIN) 500 MG tablet Take 1 tablet (500 mg total) by mouth 4 (four) times daily. 45 tablet 1   Multiple Vitamins-Minerals (OCUVITE ADULT 50+) CAPS Take 1 capsule by mouth daily.     Omega-3 Fatty Acids (FISH OIL PO) Take 1 tablet by mouth daily.      polyethylene glycol (MIRALAX / GLYCOLAX) packet Take 17 g by mouth daily as needed.     pravastatin (PRAVACHOL) 40 MG tablet Take 1 tablet (40 mg total) by mouth daily. 90 tablet 1   Propylene Glycol 0.6 % SOLN Apply 1 drop to eye daily as needed (dry eyes).      Psyllium (METAMUCIL PO) Take 15 mLs by mouth daily.      Turmeric 500 MG CAPS Take 1 capsule by mouth daily.      vitamin B-12 (CYANOCOBALAMIN) 1000 MCG tablet Take 1 tablet (1,000 mcg total) by mouth daily.     HYDROcodone-acetaminophen (NORCO/VICODIN) 5-325 MG tablet Take 1 tablet by mouth every 6 (six) hours as needed. 10 tablet 0   No current facility-administered medications for this visit.    Allergies  Allergen Reactions   Sulfa Antibiotics Other (See Comments)    As a child, rigid as a stick and not responsive   Tape Other (See Comments)    Blisters, Please use "paper" tape only for short periods of time   Glucosamine Forte [Nutritional Supplements] Rash   Latex Rash      Discussed warning signs or symptoms. Please see discharge instructions. Patient expresses understanding.  The above documentation has been reviewed and is accurate and complete Jessica Mullins

## 2019-05-29 NOTE — Patient Instructions (Signed)
Thank you for coming in today. You should hear about MRI soon.  Let me know if you do not hear anything by tomorrow.  Use hydrocodone for severe pain.  Use the walker. We will be happy to help adjust it.  Recheck following MRI.

## 2019-05-30 ENCOUNTER — Telehealth: Payer: Self-pay | Admitting: Family Medicine

## 2019-05-30 DIAGNOSIS — M5416 Radiculopathy, lumbar region: Secondary | ICD-10-CM

## 2019-05-30 DIAGNOSIS — M79604 Pain in right leg: Secondary | ICD-10-CM

## 2019-05-30 NOTE — Telephone Encounter (Signed)
Thanks for that [employer].  Has this been communicated to [place] as well?  If not, would you please contact Roberta at [facility] at [phone].

## 2019-05-30 NOTE — Telephone Encounter (Signed)
Contacted Roberta at [facility] and pt needs clearance due to being on Eliquis.  Pt has been contacted and is aware.

## 2019-05-30 NOTE — Telephone Encounter (Signed)
Epidural steroid injection ordered.  Will need to notify [facility] imaging to schedule the injection.

## 2019-05-30 NOTE — Progress Notes (Signed)
MRI L-spine fortunately does not show severe changes causing weakness. The MRI partially explains her symptoms but not fully.  I will order an epidural steroid injection but I think we should probably check back with a shot there soon in person to review your physical exam and potentially do a hip injection in my clinic.

## 2019-05-30 NOTE — Telephone Encounter (Signed)
Per Dr.Bensimhon patient needs to hold eliquis 72hours before injection and she can restart 24hours after injection.

## 2019-05-31 ENCOUNTER — Other Ambulatory Visit: Payer: Self-pay | Admitting: Family Medicine

## 2019-05-31 NOTE — Telephone Encounter (Signed)
Will hold off on calling pt until Dr. Georgina Snell hears back from Dr. Kendrick Ranch.

## 2019-05-31 NOTE — Telephone Encounter (Signed)
I would recommend holding the Eliquis 72 hours prior to procedure but not continuing the Eliquis until 96 hours after the procedure.  I will send a message to Dr. Haroldine Laws to discuss this as well.  Regardless okay to schedule the epidural steroid injection. Please inform patient.

## 2019-06-03 NOTE — Telephone Encounter (Signed)
Called pt and relayed updated info regarding re-starting her Eliquis after her epidural which is that she may restart her Eliquis 48 hours following her epidural.  Pt verbalizes understanding and states that she has written this info on her calendar.  I also inform the pt that I have contacted [facility] regarding the change.

## 2019-06-03 NOTE — Discharge Instructions (Signed)

## 2019-06-03 NOTE — Telephone Encounter (Signed)
After discussion with Dr. Haroldine Laws we will plan on holding Eliquis for 48 hours after injection.  Please update patient and radiology.

## 2019-06-03 NOTE — Telephone Encounter (Signed)
-----   Message from Jolaine Artist, MD sent at 06/01/2019 10:45 PM EST ----- Regarding: RE: Holding Eliquis Jessica Mullins,  That would be fine by me but I do think 96 hours post injection may be a bit conservative for an atraumatic injection particularly in patients with high CHADSVaSC scores. Perhaps 48 hours post is a reasonable compromise? Please feel free to update Radiology with what you prefer. Thanks for looking after her! -dan ----- Message ----- From: Gregor Hams, MD Sent: 05/31/2019  10:01 AM EST To: Jolaine Artist, MD Subject: Holding Eliquis                                Hello [name],  We have a mutual patient, [name] MRN [id number].  I ordered a lumbar epidural steroid injection. She is on Eliquis for Afib. Radiology sent you clearance request for holding Eliquis for the procedure. You recommended stopping Eliquis 72 hrs prior to procedure and restarting it 24 hrs after the procedure.   The guidelines (not super evidence based) that I have been following recommend not restarting anticoagulation until 96 hrs after spinal procedure.   What do you think in this case? I have included the document I have been using to make these recommendations previously.   I also sent this via email and attached the document I used.   Texting is ok if you need anything [phone]  Jessica Mullins

## 2019-06-04 ENCOUNTER — Other Ambulatory Visit: Payer: Self-pay

## 2019-06-04 ENCOUNTER — Ambulatory Visit
Admission: RE | Admit: 2019-06-04 | Discharge: 2019-06-04 | Disposition: A | Discharge status: Home / Self Care | Payer: Medicare PPO | Source: Ambulatory Visit | Attending: Family Medicine | Admitting: Family Medicine

## 2019-06-04 DIAGNOSIS — M5416 Radiculopathy, lumbar region: Secondary | ICD-10-CM

## 2019-06-04 DIAGNOSIS — M79604 Pain in right leg: Secondary | ICD-10-CM

## 2019-06-04 MED ORDER — METHYLPREDNISOLONE ACETATE 40 MG/ML INJ SUSP (RADIOLOG
120.0000 mg | Freq: Once | INTRAMUSCULAR | Status: AC
  Administered 2019-06-04: 120 mg via EPIDURAL

## 2019-06-04 MED ORDER — IOPAMIDOL (ISOVUE-M 200) INJECTION 41%
1.0000 mL | Freq: Once | INTRAMUSCULAR | Status: AC
  Administered 2019-06-04: 1 mL via EPIDURAL

## 2019-06-19 ENCOUNTER — Other Ambulatory Visit (HOSPITAL_COMMUNITY): Payer: Self-pay | Admitting: Internal Medicine

## 2019-06-24 ENCOUNTER — Encounter: Payer: Self-pay | Admitting: Family Medicine

## 2019-06-24 ENCOUNTER — Ambulatory Visit: Payer: Medicare PPO | Admitting: Family Medicine

## 2019-06-24 ENCOUNTER — Other Ambulatory Visit: Payer: Self-pay

## 2019-06-24 VITALS — BP 140/90 | HR 59 | Ht 61.0 in | Wt 153.0 lb

## 2019-06-24 DIAGNOSIS — R2689 Other abnormalities of gait and mobility: Secondary | ICD-10-CM | POA: Diagnosis not present

## 2019-06-24 DIAGNOSIS — R269 Unspecified abnormalities of gait and mobility: Secondary | ICD-10-CM

## 2019-06-24 DIAGNOSIS — M5416 Radiculopathy, lumbar region: Secondary | ICD-10-CM | POA: Diagnosis not present

## 2019-06-24 NOTE — Patient Instructions (Signed)
Thank you for coming in today. Attend PT.  Let me know if your leg pain returns.  Recheck with me as needed.  I am happy to see you at any time.

## 2019-06-24 NOTE — Progress Notes (Signed)
Rito Ehrlich, am serving as a scribe for Dr. Lynne Leader.  Jessica Mullins is a 85 y.o. female who presents to [place] at [facility] today for follow-up leg pain.  Jessica Mullins was originally seen on November 24 and subsequently on December 2 for leg pain thought to be originally trochanteric bursitis and subsequently lumbar radiculopathy.  Her symptoms were rapidly progressive and I was concerned for potential serious nerve compression and arrange for outpatient MRI which was performed on December 2.  She had subsequent epidural steroid injection on December 8 at right L4 nerve root.  In the interim she notes 12 days after the injection went into effect. Is still needing to use a walking stick for balance but the horrible pain has subsided.  Overall she think she is doing okay but notes that she is feels a bit unstable and thinks that she could benefit from further physical therapy.    ROS:  As above  Exam:  BP 140/90 (BP Location: Right Arm, Patient Position: Sitting, Cuff Size: Normal)   Pulse (!) 59   Ht 5\' 1"  (1.549 m)   Wt 153 lb (69.4 kg)   SpO2 98%   BMI 28.91 kg/m  Wt Readings from Last 5 Encounters:  06/24/19 153 lb (69.4 kg)  05/21/19 157 lb (71.2 kg)  05/10/19 156 lb 9.6 oz (71 kg)  04/05/19 160 lb (72.6 kg)  08/15/18 166 lb 3.2 oz (75.4 kg)   General: Well Developed, well nourished, and in no acute distress.  Neuro/Psych: Alert and oriented x3, extra-ocular muscles intact, able to move all 4 extremities, sensation grossly intact. Skin: Warm and dry, no rashes noted.  Respiratory: Not using accessory muscles, speaking in full sentences, trachea midline.  Cardiovascular: Pulses palpable, no extremity edema. Abdomen: Does not appear distended. MSK:  L-spine: Nontender to spinal midline.  Mild antalgic gait present.    Lab and Radiology Results DG Lumbar Spine Complete  Result Date: 05/10/2019 CLINICAL DATA:  Low back pain after moving  furniture 1 month ago. EXAM: LUMBAR SPINE - COMPLETE 4+ VIEW COMPARISON:  None. FINDINGS: Minimal grade 1 anterolisthesis is noted at L4-5 and L5-S1 secondary to posterior facet joint hypertrophy. Severe degenerative disc disease is noted at these levels as well. Diffuse osteopenia is noted. No definite fracture is noted. IMPRESSION: Severe multilevel degenerative disc disease. No acute abnormality seen in the lumbar spine. Electronically Signed   By: Marijo Conception M.D.   On: 05/10/2019 16:53   MR Lumbar Spine Wo Contrast  Result Date: 05/29/2019 CLINICAL DATA:  Back pain. Cauda equina syndrome. Right buttock and leg pain. Right leg weakness. EXAM: MRI LUMBAR SPINE WITHOUT CONTRAST TECHNIQUE: Multiplanar, multisequence MR imaging of the lumbar spine was performed. No intravenous contrast was administered. COMPARISON:  Radiography 05/10/2019.  MRI 03/22/2018. FINDINGS: Segmentation: 5 lumbar type vertebral bodies as numbered previously. Alignment: 9 mm anterolisthesis at L4-5 as seen previously. 7 mm anterolisthesis L5-S1 as seen previously. Vertebrae:  No fracture or primary bone lesion. Conus medullaris and cauda equina: Conus extends to the L1-2 level. Conus and cauda equina appear normal. Paraspinal and other soft tissues: Negative Disc levels: No significant finding at L1-2 or above. L2-3: Bulging of the disc. Mild facet and ligamentous hypertrophy. Mild stenosis of both lateral recesses but no visible neural compression. L3-4: Bulging of the disc. Facet and ligamentous hypertrophy. Mild multifactorial stenosis but without visible neural compression. L4-5: Chronic facet arthropathy with  9 mm of anterolisthesis. Chronic disc degeneration with loss of disc height. Narrowing of the lateral recesses and neural foramina that could cause neural compression on either or both sides. Foraminal narrowing is considerably worse on the right. L5-S1: Chronic facet arthropathy with 7 mm of anterolisthesis. Chronic disc  degeneration with loss of disc height. Central canal is sufficiently patent. Foraminal narrowing left worse than right. Left L5 nerve compression could occur. No discernible change since the study of last year. IMPRESSION: No change visible since last year. Degenerative anterolisthesis at L4-5 and L5-S1. At L4-5, there is bilateral lateral recess stenosis and right more than left foraminal stenosis that could cause neural compression, particularly affecting the right L4 nerve. At L5-S1, there is foraminal stenosis left worse than right that could compress the left L5 nerve. Grossly non-compressive degenerative changes at L2-3 and L3-4 with disc bulges and facet hypertrophy. This findings could certainly relate to back pain. The appearance could worsen with standing or flexion. Electronically Signed   By: Nelson Chimes M.D.   On: 05/29/2019 20:08   DG Epidural/Nerve Root  Result Date: 06/04/2019 CLINICAL DATA:  Lumbosacral spondylosis without myelopathy. Right low back pain radiating into the lateral and anterior right thigh to the knee. Anterolisthesis at L4-5 with severe right neural foraminal stenosis. EXAM: EPIDURAL/NERVE ROOT FLUOROSCOPY TIME:  Fluoroscopy Time: 21 seconds Radiation Exposure Index: 38.89 microGray*m^2 PROCEDURE: The procedure, risks, benefits, and alternatives were explained to the patient. Questions regarding the procedure were encouraged and answered. The patient understands and consents to the procedure. RIGHT L4 NERVE ROOT BLOCK AND TRANSFORAMINAL EPIDURAL: A posterior oblique approach was taken to the intervertebral foramen on the right at L4-5 using a curved 5 inch 22 gauge spinal needle. Injection of Isovue-M 200 outlined the right L4 nerve root and showed good epidural spread. No vascular opacification is seen. 120 mg of Depo-Medrol mixed with 1.5 mL of 1% lidocaine were instilled. The procedure was well-tolerated, and the patient was discharged thirty minutes following the injection  in good condition. COMPLICATIONS: None IMPRESSION: Technically successful injection consisting of a right L4 nerve root block and transforaminal epidural. Electronically Signed   By: Logan Bores M.D.   On: 06/04/2019 11:12   DG Hip Unilat W OR W/O Pelvis 2-3 Views Right  Result Date: 05/10/2019 CLINICAL DATA:  Right hip pain after moving furniture 1 week ago. EXAM: DG HIP (WITH OR WITHOUT PELVIS) 2-3V RIGHT COMPARISON:  April 27, 2009. FINDINGS: There is no evidence of hip fracture or dislocation. Mild narrowing of right hip joint is noted. IMPRESSION: Mild degenerative joint disease of right hip is noted. No acute abnormality is noted. Electronically Signed   By: Marijo Conception M.D.   On: 05/10/2019 16:51   I, Lynne Leader, personally (independently) visualized and performed the interpretation of the images attached in this note.     Assessment and Plan: 85 y.o. female with lumbar radiculopathy to right leg significant improvement following epidural steroid injection.  Plan to proceed with physical therapy for balance and gait training.  Recheck back with me as needed.  Would consider repeat injections in the future if needed.   PDMP not reviewed this encounter. Orders Placed This Encounter  Procedures  . Ambulatory referral to Physical Therapy    Referral Priority:   Routine    Referral Type:   Physical Medicine    Referral Reason:   Specialty Services Required    Requested Specialty:   Physical Therapy   No orders of  the defined types were placed in this encounter.   Historical information moved to improve visibility of documentation.  Past Medical History:  Diagnosis Date  . Allergy   . Ankle fracture, left   . Breast cancer ([place]) 1115/16   left   . Bursitis of right shoulder   . Cataract   . Family history of cancer   . Fracture of right wrist   . GERD (gastroesophageal reflux disease)   . Hyperlipidemia   . Hypertension   . Skin cancer 2000   melanoma and basal cell   . Vaso vagal episode    during preparation for colonoscopy   Past Surgical History:  Procedure Laterality Date  . ABDOMINAL HYSTERECTOMY  1988   TAH/BSO--FIBROIDS  . APPENDECTOMY    . BREAST LUMPECTOMY     B/L--FCS  . BREAST LUMPECTOMY WITH RADIOACTIVE SEED AND SENTINEL LYMPH NODE BIOPSY Left 06/11/2015   Procedure: LEFT BREAST LUMPECTOMY WITH RADIOACTIVE SEED AND LEFT SENTINEL LYMPH NODE MAPPING;  Surgeon: Erroll Luna, MD;  Location: [place];  Service: General;  Laterality: Left;  . CATARACT EXTRACTION  2015  . COLONOSCOPY  2010  . DG  BONE DENSITY ([place] HX)    . EYE SURGERY Bilateral    cataracts  . fiberadenoma Bilateral 1978, 1980  . GANGLION CYST EXCISION     L  hand  . Lipiflow procedure     Social History   Tobacco Use  . Smoking status: Never Smoker  . Smokeless tobacco: Never Used  Substance Use Topics  . Alcohol use: No   family history includes Cancer in her mother; Coronary artery disease in her brother; Diabetes in her brother and maternal aunt; Heart disease in her brother; Heart disease (age of onset: 33) in her father; Hyperlipidemia in her brother; Hypertension in her brother; Lung cancer in an other family member; Lung cancer (age of onset: 62) in her sister; Thrombosis in an other family member.  Medications: Current Outpatient Medications  Medication Sig Dispense Refill  . alendronate (FOSAMAX) 70 MG tablet TAKE ONE TABLET BY MOUTH EVERY SEVEN DAYS. TAKE WITH GLASS OF WATER AT NOON ON ASSIGNED DAY ON EMPTY STOMACH 4 tablet 4  . AMBULATORY NON FORMULARY MEDICATION Walker use daily as directed.  Disp 1 Leg pain M79.604 1 each 0  . Biotin (BIOTIN 5000) 5 MG CAPS Take 5 mg by mouth daily.     . Calcium Carbonate-Vitamin D (CALTRATE 600+D) 600-400 MG-UNIT per tablet Take 1 tablet by mouth daily.     . Cholecalciferol (VITAMIN D3) 1000 UNITS CAPS Take 1 capsule by mouth daily.      . cycloSPORINE (RESTASIS) 0.05 % ophthalmic emulsion PLACE 1 DROP INTO BOTH  EYES TWO (TWO) TIMES DAILY.    [name] diltiazem (CARDIZEM CD) 180 MG 24 hr capsule TAKE 1 CAPSULE (180 MG TOTAL) BY MOUTH DAILY. 90 capsule 2  . ELIQUIS 5 MG TABS tablet TAKE ONE TABLET BY MOUTH TWICE A DAY 180 tablet 0  . flecainide (TAMBOCOR) 50 MG tablet TAKE ONE TABLET BY MOUTH TWICE A DAY 60 tablet 11  . gabapentin (NEURONTIN) 300 MG capsule Take 1 capsule (300 mg total) by mouth at bedtime as needed (leg pain). 90 capsule 3  . HYDROcodone-acetaminophen (NORCO/VICODIN) 5-325 MG tablet Take 1 tablet by mouth every 6 (six) hours as needed. 10 tablet 0  . ketoconazole (NIZORAL) 2 % cream Apply 1 application topically daily as needed for irritation. 15 g 2  . methocarbamol (ROBAXIN) 500 MG tablet  Take 1 tablet (500 mg total) by mouth 4 (four) times daily. 45 tablet 1  . Multiple Vitamins-Minerals (OCUVITE ADULT 50+) CAPS Take 1 capsule by mouth daily.    . Omega-3 Fatty Acids (FISH OIL PO) Take 1 tablet by mouth daily.     . polyethylene glycol (MIRALAX / GLYCOLAX) packet Take 17 g by mouth daily as needed.    . pravastatin (PRAVACHOL) 40 MG tablet Take 1 tablet (40 mg total) by mouth daily. 90 tablet 1  . Propylene Glycol 0.6 % SOLN Apply 1 drop to eye daily as needed (dry eyes).     . Psyllium (METAMUCIL PO) Take 15 mLs by mouth daily.     . Turmeric 500 MG CAPS Take 1 capsule by mouth daily.     . vitamin B-12 (CYANOCOBALAMIN) 1000 MCG tablet Take 1 tablet (1,000 mcg total) by mouth daily.     No current facility-administered medications for this visit.   Allergies  Allergen Reactions  . Sulfa Antibiotics Other (See Comments)    As a child, rigid as a stick and not responsive  . Tape Other (See Comments)    Blisters, Please use "paper" tape only for short periods of time  . Glucosamine Forte [Nutritional Supplements] Rash  . Latex Rash      Discussed warning signs or symptoms. Please see discharge instructions. Patient expresses understanding.  The above documentation has been  reviewed and is accurate and complete Lynne Leader

## 2019-07-02 ENCOUNTER — Telehealth: Payer: Self-pay

## 2019-07-02 NOTE — Telephone Encounter (Signed)
Patient was told to call back and let Dr. Georgina Snell know if there was any pain after the epidural injection. Patient is scheduled for PT to start on the 11th, but patient states that she is having 5/10 pain in upper right hip. Pain comes and goes. Patient alternating ice and heat. Patient is wanting to know if she should stick with the PT or if there is any other suggestion for the pain.

## 2019-07-02 NOTE — Telephone Encounter (Signed)
Physical therapy is a good idea.  However if you are having returning pain I am happy to see you again anytime in the near future.  Feel free to schedule appointment.  Ellard Artis

## 2019-07-02 NOTE — Telephone Encounter (Signed)
Contacted pt and relayed Dr. Clovis Riley thoughts regarding PT.  Advise pt that she will need to give PT a few weeks to see if it's helping.  If she con't to have progression of her R hip pain, pt advised to call and schedule an appt w/ Dr. Georgina Snell sooner rather than later.  Otherwise, she should give PT a try and then f/u in a few weeks.

## 2019-07-08 ENCOUNTER — Ambulatory Visit: Payer: Medicare PPO | Attending: Family Medicine | Admitting: Physical Therapy

## 2019-07-08 ENCOUNTER — Other Ambulatory Visit: Payer: Self-pay

## 2019-07-08 ENCOUNTER — Encounter: Payer: Self-pay | Admitting: Physical Therapy

## 2019-07-08 ENCOUNTER — Other Ambulatory Visit: Payer: Self-pay | Admitting: Hematology and Oncology

## 2019-07-08 DIAGNOSIS — R296 Repeated falls: Secondary | ICD-10-CM | POA: Diagnosis not present

## 2019-07-08 DIAGNOSIS — M545 Low back pain, unspecified: Secondary | ICD-10-CM

## 2019-07-08 DIAGNOSIS — R262 Difficulty in walking, not elsewhere classified: Secondary | ICD-10-CM

## 2019-07-08 DIAGNOSIS — I4819 Other persistent atrial fibrillation: Secondary | ICD-10-CM

## 2019-07-08 DIAGNOSIS — R2689 Other abnormalities of gait and mobility: Secondary | ICD-10-CM | POA: Diagnosis not present

## 2019-07-08 DIAGNOSIS — M62838 Other muscle spasm: Secondary | ICD-10-CM | POA: Diagnosis not present

## 2019-07-08 DIAGNOSIS — G8929 Other chronic pain: Secondary | ICD-10-CM | POA: Diagnosis not present

## 2019-07-08 DIAGNOSIS — R2681 Unsteadiness on feet: Secondary | ICD-10-CM | POA: Diagnosis not present

## 2019-07-08 NOTE — Therapy (Signed)
[place], [place], [address] Phone: [phone]   Fax:  [phone]  Physical Therapy Evaluation  Patient Details  Name: Jessica Mullins MRN: [id number] Date of Birth: Oct 30, 1930 Referring Provider (PT): Dr Lynne Leader   Encounter Date: 07/08/2019  PT End of Session - 07/08/19 1209    Visit Number  1    Number of Visits  8    Date for PT Re-Evaluation  08/05/19    Authorization Type  [employer] M[employer] 10th visit pnote    PT Start Time  1209    PT Stop Time  1318    PT Time Calculation (min)  69 min    Activity Tolerance  Patient tolerated treatment well    Behavior During Therapy  [facility] for tasks assessed/performed       Past Medical History:  Diagnosis Date  . Allergy   . Ankle fracture, left   . Breast cancer ([place]) 1115/16   left   . Bursitis of right shoulder   . Cataract   . Family history of cancer   . Fracture of right wrist   . GERD (gastroesophageal reflux disease)   . Hyperlipidemia   . Hypertension   . Skin cancer 2000   melanoma and basal cell  . Vaso vagal episode    during preparation for colonoscopy    Past Surgical History:  Procedure Laterality Date  . ABDOMINAL HYSTERECTOMY  1988   TAH/BSO--FIBROIDS  . APPENDECTOMY    . BREAST LUMPECTOMY     B/L--FCS  . BREAST LUMPECTOMY WITH RADIOACTIVE SEED AND SENTINEL LYMPH NODE BIOPSY Left 06/11/2015   Procedure: LEFT BREAST LUMPECTOMY WITH RADIOACTIVE SEED AND LEFT SENTINEL LYMPH NODE MAPPING;  Surgeon: Erroll Luna, MD;  Location: [place];  Service: General;  Laterality: Left;  . CATARACT EXTRACTION  2015  . COLONOSCOPY  2010  . DG  BONE DENSITY ([place] HX)    . EYE SURGERY Bilateral    cataracts  . fiberadenoma Bilateral 1978, 1980  . GANGLION CYST EXCISION     L  hand  . Lipiflow procedure      There were no vitals filed for this visit.   Subjective Assessment - 07/08/19 1209    Subjective  Pt with progress back  pain, had an injection 06/04/2019.  Per MD note her pain is better however she feels unsteady on her feet. She is borrowing a friends walker and needs it adjusted. The pain she has now is very different, it feels more deep in the Rt hip and not nerve pain any more. The pain does cause her to use a walker now when she first gets up    Patient Stated Goals  walk for exercise, exercise on line - a program she did every morning    Currently in Pain?  Yes    Pain Score  7    at its worse   Pain Orientation  Right    Pain Descriptors / Indicators  Dull    Pain Type  Chronic pain    Pain Radiating Towards  deep in the Rt hip posteriorly    Pain Onset  More than a month ago    Pain Frequency  Intermittent    Aggravating Factors   develops overnight when she is in bed    Pain Relieving Factors  by about 2pm she feels better and doesn't need the walker anymore  [facility] PT Assessment - 07/08/19 0001      Assessment   Medical Diagnosis  lumbar radiculopathy and balance d/o    Referring Provider (PT)  Dr Lynne Leader    Onset Date/Surgical Date  04/11/19    Hand Dominance  Right    Next MD Visit  PRN    Prior Therapy  in the past for balance      Precautions   Precautions  None    Precaution Comments  osteopenia      Balance Screen   Has the patient fallen in the past 6 months  No    Has the patient had a decrease in activity level because of a fear of falling?   Yes    Is the patient reluctant to leave their home because of a fear of falling?   Yes      [place]  Private residence    [place] to enter   has difficulty if no railing   Additional Comments  does her own shopping, son can help if needed      Prior Function   Level of [place]  Retired    Leisure  read, use to walk and travel       Functional Tests   Functional tests  Single leg stance;Sit to Stand      Single Leg  Stance   Comments  Lt 1 sec, Rt 2 sec      Sit to Stand   Comments  WNL      ROM / Strength   AROM / PROM / Strength  AROM;Strength      AROM   AROM Assessment Site  Hip;Knee    Right/Left Hip  --   WNL   Right/Left Knee  --   WNL     Strength   Strength Assessment Site  Hip;Knee;Ankle    Right/Left Hip  Right   Lt WNL except abduction 4/5   Right Hip Flexion  3-/5    Right Hip Extension  3+/5    Right Hip ABduction  3/5    Right/Left Knee  --   WNL except Rt hamstring 4/5   Right/Left Ankle  --   WNL except Rt eversion 4/5     Flexibility   Soft Tissue Assessment /Muscle Length  yes    Hamstrings  WNL    Quadriceps  prone knee flex Lt 145, Rt 130      Palpation   Palpation comment  very tight and tender in Rt piriformis some tightness in gluts      Special Tests   Other special tests  (-) lumbar special tests       Transfers   Comments  assists Rt LE to lie down      Ambulation/Gait   Gait Pattern  Decreased stance time - right      Standardized Balance Assessment   Standardized Balance Assessment  Timed Up and Go Test;Dynamic Gait Index      Dynamic Gait Index   Level Surface  Mild Impairment    Change in Gait Speed  Mild Impairment    Gait with Horizontal Head Turns  Normal    Gait with Vertical Head Turns  Mild Impairment    Gait and Pivot Turn  Mild Impairment    Step Over Obstacle  Severe Impairment    Step Around Obstacles  Mild  Impairment    Steps  Moderate Impairment    Total Score  14      Timed Up and Go Test   Normal TUG (seconds)  17                Objective measurements completed on examination: See above findings.      [place] [facility] Adult PT Treatment/Exercise - 07/08/19 0001      Self-Care   Self-Care  Other Self-Care Comments    Other Self-Care Comments   self trigger point release with ball against the wall      Exercises   Exercises  Lumbar      Lumbar Exercises: Stretches   Piriformis Stretch  Left;Right;3 reps;30  seconds      Lumbar Exercises: Supine   Bridge  20 reps      Modalities   Modalities  Moist Heat      Moist Heat Therapy   Number Minutes Moist Heat  15 Minutes    Moist Heat Location  Lumbar Spine   buttocks            PT Education - 07/08/19 1309    Education Details  HEP, DN, self trigger point release and POC    Person(s) Educated  Patient    Methods  Explanation;Demonstration;Handout    Comprehension  Returned demonstration;Verbalized understanding          PT Long Term Goals - 07/08/19 1316      PT LONG TERM GOAL #1   Title  patient to be independent with balance and strengthening HEP ( 08/05/2019)    Time  4    Period  Weeks    Status  New    Target Date  08/05/19      PT LONG TERM GOAL #2   Title  improve TUG =/< 12 sec to decrease risk of falls. ( 08/05/2019)    Time  4    Period  Weeks    Status  New    Target Date  08/05/19      PT LONG TERM GOAL #3   Title  improve Rt hip strength =/> 4+/5 to allow safe ambulation with her walking stick  ( 08/05/2019)    Time  4    Period  Weeks    Status  New    Target Date  08/05/19      PT LONG TERM GOAL #4   Title  improve DGI =/> 20/24 to decrease risk of falls ( 08/05/2019)    Time  4    Period  Weeks    Status  New    Target Date  08/05/19      PT LONG TERM GOAL #5   Title  report =/> 50% reduction of Rt buttock pain in the morning ( 08/05/2019)    Time  4    Period  Weeks    Status  New    Target Date  08/05/19             Plan - 07/08/19 1310    Clinical Impression Statement  86 yo female, lives independently, had progressive back and Rt leg pain.  She reports the sharp really bad pain is gone after having an epidural injection.  She now has deep buttock pain on the Rt side.  This causes her to feel unstable with standing mainly in the morning.  She is borrowing a friends walker to use for night time tolieting and for the first half of the  day. It is to tall for her and will not adjust down.  It  is doing the job for her right now.  After she loosens up she uses her walking stick.  She is weak in the Rt hip, has a lot of tightness in the Rt piriformis and glut and balance defecits based on her TUG and DGI.  She would benefit from PT to address these defecits, reduce her risk of falls and maximize functional abilities.    Personal Factors and Comorbidities  Age;Comorbidity 3+    Examination-Activity Limitations  Bed Mobility;Stand;Locomotion Level    Examination-Participation Restrictions  Community Activity;Other    Stability/Clinical Decision Making  Stable/Uncomplicated    Clinical Decision Making  Low    Rehab Potential  Excellent    PT Frequency  2x / week    PT Duration  4 weeks    PT Treatment/Interventions  Iontophoresis 4mg /ml Dexamethasone;Taping;Patient/family education;Functional mobility training;Moist Heat;Passive range of motion;Ultrasound;Cryotherapy;Therapeutic exercise;Balance training;Neuromuscular re-education;Manual techniques;Dry needling    PT Next Visit Plan  DN Rt piriformis and gluts, balance, LE strengthening    Consulted and Agree with Plan of Care  Patient       Patient will benefit from skilled therapeutic intervention in order to improve the following deficits and impairments:  Abnormal gait, Increased muscle spasms, Pain, Decreased balance, Decreased strength  Visit Diagnosis: Unsteadiness on feet - Plan: PT plan of care cert/re-cert  Difficulty in walking, not elsewhere classified - Plan: PT plan of care cert/re-cert  Chronic bilateral low back pain without sciatica - Plan: PT plan of care cert/re-cert  Other abnormalities of gait and mobility - Plan: PT plan of care cert/re-cert  Other muscle spasm - Plan: PT plan of care cert/re-cert     Problem List Patient Active Problem List   Diagnosis Date Noted  . Balance disorder 04/05/2019  . Constipation by delayed colonic transit 12/13/2017  . Hemorrhoids 02/14/2017  . Atrial fibrillation ([place])  08/10/2015  . Orthostatic hypotension 08/04/2015  . Leukocytosis 07/27/2015  . Genetic testing 07/27/2015  . Family history of cancer   . Cancer of central portion of left female breast ([place]) 06/01/2015  . Bilateral low back pain without sciatica 11/18/2014  . Right shoulder pain 04/10/2014  . Muscle spasm of back 01/13/2014  . Olecranon bursitis of left elbow 08/26/2013  . Posterolateral cervical muscle strain 12/14/2012  . Hyperlipidemia 02/16/2010  . TMJ PAIN 02/16/2010  . SEBORRHEIC KERATOSIS 08/24/2009  . Essential hypertension 10/18/2007  . OSTEOPENIA 07/12/2007  . Melanoma of skin ([place]) 10/17/2006  . BREAST CYST 10/17/2006  . COLONOSCOPY, HX OF 10/17/2006    Boneta Lucks rPT  07/08/2019, 1:26 PM  [place], [place], [address] Phone: [phone]   Fax:  [phone]  Name: Layli L Burdette MRN: [id number] Date of Birth: [birth year]

## 2019-07-09 ENCOUNTER — Telehealth: Payer: Self-pay | Admitting: Physical Therapy

## 2019-07-09 NOTE — Telephone Encounter (Signed)
Returned patients call.  She is concerned because her pain has returned with a "vengeance" after doing her exercise this morning.  She was fine when she did them last night.   Currently using a heating pad to settle pain down.   Recommend patient hold figure 4 bridges until she returns and only perform stretch and ball release for now.  She voiced understanding  Jeral Pinch, PT 07/09/19 1:25 PM

## 2019-07-15 ENCOUNTER — Ambulatory Visit: Payer: Medicare PPO | Admitting: Physical Therapy

## 2019-07-15 ENCOUNTER — Other Ambulatory Visit: Payer: Self-pay

## 2019-07-15 DIAGNOSIS — R262 Difficulty in walking, not elsewhere classified: Secondary | ICD-10-CM

## 2019-07-15 DIAGNOSIS — M545 Low back pain, unspecified: Secondary | ICD-10-CM

## 2019-07-15 DIAGNOSIS — M62838 Other muscle spasm: Secondary | ICD-10-CM

## 2019-07-15 DIAGNOSIS — R2681 Unsteadiness on feet: Secondary | ICD-10-CM

## 2019-07-15 DIAGNOSIS — R2689 Other abnormalities of gait and mobility: Secondary | ICD-10-CM | POA: Diagnosis not present

## 2019-07-15 DIAGNOSIS — G8929 Other chronic pain: Secondary | ICD-10-CM

## 2019-07-15 DIAGNOSIS — R296 Repeated falls: Secondary | ICD-10-CM | POA: Diagnosis not present

## 2019-07-15 NOTE — Therapy (Signed)
[place], [place], [address] Phone: [phone]   Fax:  [phone]  Physical Therapy Treatment  Patient Details  Name: Jessica Mullins MRN: [id number] Date of Birth: 03-24-1931 Referring Provider (PT): Dr Lynne Leader   Encounter Date: 07/15/2019  PT End of Session - 07/15/19 1129    Visit Number  2    Number of Visits  8    Date for PT Re-Evaluation  08/05/19    PT Start Time  1055    PT Stop Time  1145    PT Time Calculation (min)  50 min       Past Medical History:  Diagnosis Date  . Allergy   . Ankle fracture, left   . Breast cancer ([place]) 1115/16   left   . Bursitis of right shoulder   . Cataract   . Family history of cancer   . Fracture of right wrist   . GERD (gastroesophageal reflux disease)   . Hyperlipidemia   . Hypertension   . Skin cancer 2000   melanoma and basal cell  . Vaso vagal episode    during preparation for colonoscopy    Past Surgical History:  Procedure Laterality Date  . ABDOMINAL HYSTERECTOMY  1988   TAH/BSO--FIBROIDS  . APPENDECTOMY    . BREAST LUMPECTOMY     B/L--FCS  . BREAST LUMPECTOMY WITH RADIOACTIVE SEED AND SENTINEL LYMPH NODE BIOPSY Left 06/11/2015   Procedure: LEFT BREAST LUMPECTOMY WITH RADIOACTIVE SEED AND LEFT SENTINEL LYMPH NODE MAPPING;  Surgeon: Erroll Luna, MD;  Location: [facility];  Service: General;  Laterality: Left;  . CATARACT EXTRACTION  2015  . COLONOSCOPY  2010  . DG  BONE DENSITY ([place] HX)    . EYE SURGERY Bilateral    cataracts  . fiberadenoma Bilateral 1978, 1980  . GANGLION CYST EXCISION     L  hand  . Lipiflow procedure      There were no vitals filed for this visit.  Subjective Assessment - 07/15/19 1059    Subjective  pain in RT hip and takes 1- 1.5 to get going in the morning    Currently in Pain?  Yes    Pain Score  2     Pain Location  Hip    Pain Orientation  Right                        [facility] Adult PT Treatment/Exercise - 07/15/19 0001      Lumbar Exercises: Aerobic   Nustep  L 3 5 min      Lumbar Exercises: Seated   Long Arc Quad on Chair  Strengthening;Both;10 reps   red tband   Other Seated Lumbar Exercises  hip and and flex BIL red tband 15 times      Lumbar Exercises: Supine   Bridge  Compliant;10 reps   feet on ball bridge, KTC and obl   Bridge with clamshell  15 reps;Compliant;2 seconds    Straight Leg Raise  10 reps   with abd     Modalities   Modalities  Moist Heat      Moist Heat Therapy   Number Minutes Moist Heat  10 Minutes    Moist Heat Location  Lumbar Spine   buttock/hip     Manual Therapy   Manual Therapy  Passive ROM    Manual therapy comments  tightness BIL HS, pain in RT ITB with  trigger pt tenderness   skilled palpation and monitoring during DN    Passive ROM  LE and trunk       Trigger Point Dry Needling - 07/15/19 0001    Consent Given?  Yes    Education Handout Provided  Yes    Muscles Treated Back/Hip  Gluteus medius;Gluteus maximus;Piriformis;Tensor fascia lata   all Rt side   Gluteus Medius Response  Palpable increased muscle length;Twitch response elicited    Gluteus Maximus Response  Twitch response elicited;Palpable increased muscle length    Piriformis Response  Palpable increased muscle length;Twitch response elicited    Tensor Fascia Lata Response  Twitch response elicited;Palpable increased muscle length       provided by Jeral Pinch, PT 07/15/19 12:22 PM          PT Long Term Goals - 07/08/19 1316      PT LONG TERM GOAL #1   Title  patient to be independent with balance and strengthening HEP ( 08/05/2019)    Time  4    Period  Weeks    Status  New    Target Date  08/05/19      PT LONG TERM GOAL #2   Title  improve TUG =/< 12 sec to decrease risk of falls. ( 08/05/2019)    Time  4    Period  Weeks    Status  New    Target Date  08/05/19      PT LONG TERM GOAL  #3   Title  improve Rt hip strength =/> 4+/5 to allow safe ambulation with her walking stick  ( 08/05/2019)    Time  4    Period  Weeks    Status  New    Target Date  08/05/19      PT LONG TERM GOAL #4   Title  improve DGI =/> 20/24 to decrease risk of falls ( 08/05/2019)    Time  4    Period  Weeks    Status  New    Target Date  08/05/19      PT LONG TERM GOAL #5   Title  report =/> 50% reduction of Rt buttock pain in the morning ( 08/05/2019)    Time  4    Period  Weeks    Status  New    Target Date  08/05/19            Plan - 07/15/19 1130    Clinical Impression Statement  pt tolerated intial ther ex progression fait- pain,weakness and "muscles jumping" - which where visable and caused LOB in standing. Notebale difference in ROM in each hip , RT worse as well as RT hip weaker.    PT Treatment/Interventions  Iontophoresis 4mg /ml Dexamethasone;Taping;Patient/family education;Functional mobility training;Moist Heat;Passive range of motion;Ultrasound;Cryotherapy;Therapeutic exercise;Balance training;Neuromuscular re-education;Manual techniques;Dry needling    PT Next Visit Plan  assess DN and progress LE ex       Patient will benefit from skilled therapeutic intervention in order to improve the following deficits and impairments:  Abnormal gait, Increased muscle spasms, Pain, Decreased balance, Decreased strength  Visit Diagnosis: Unsteadiness on feet  Difficulty in walking, not elsewhere classified  Chronic bilateral low back pain without sciatica  Other muscle spasm     Problem List Patient Active Problem List   Diagnosis Date Noted  . Balance disorder 04/05/2019  . Constipation by delayed colonic transit 12/13/2017  . Hemorrhoids 02/14/2017  . Atrial fibrillation ([place]) 08/10/2015  . Orthostatic hypotension 08/04/2015  .  Leukocytosis 07/27/2015  . Genetic testing 07/27/2015  . Family history of cancer   . Cancer of central portion of left female breast ([place])  06/01/2015  . Bilateral low back pain without sciatica 11/18/2014  . Right shoulder pain 04/10/2014  . Muscle spasm of back 01/13/2014  . Olecranon bursitis of left elbow 08/26/2013  . Posterolateral cervical muscle strain 12/14/2012  . Hyperlipidemia 02/16/2010  . TMJ PAIN 02/16/2010  . SEBORRHEIC KERATOSIS 08/24/2009  . Essential hypertension 10/18/2007  . OSTEOPENIA 07/12/2007  . Melanoma of skin ([place]) 10/17/2006  . BREAST CYST 10/17/2006  . COLONOSCOPY, HX OF 10/17/2006    SHAVER,SUE PTA 07/15/2019, 12:22 PM  [place], [place], [address] Phone: [phone]   Fax:  [phone]  Name: Jessica Mullins MRN: [id number] Date of Birth: 03-13-1931

## 2019-07-17 ENCOUNTER — Other Ambulatory Visit: Payer: Self-pay

## 2019-07-17 ENCOUNTER — Encounter: Payer: Self-pay | Admitting: Physical Therapy

## 2019-07-17 ENCOUNTER — Ambulatory Visit: Payer: Medicare PPO | Admitting: Physical Therapy

## 2019-07-17 DIAGNOSIS — G8929 Other chronic pain: Secondary | ICD-10-CM

## 2019-07-17 DIAGNOSIS — R2681 Unsteadiness on feet: Secondary | ICD-10-CM

## 2019-07-17 DIAGNOSIS — R296 Repeated falls: Secondary | ICD-10-CM | POA: Diagnosis not present

## 2019-07-17 DIAGNOSIS — R2689 Other abnormalities of gait and mobility: Secondary | ICD-10-CM | POA: Diagnosis not present

## 2019-07-17 DIAGNOSIS — M545 Low back pain, unspecified: Secondary | ICD-10-CM

## 2019-07-17 DIAGNOSIS — R262 Difficulty in walking, not elsewhere classified: Secondary | ICD-10-CM

## 2019-07-17 DIAGNOSIS — M62838 Other muscle spasm: Secondary | ICD-10-CM | POA: Diagnosis not present

## 2019-07-17 NOTE — Therapy (Signed)
[place], [place], [address] Phone: [phone]   Fax:  [phone]  Physical Therapy Treatment  Patient Details  Name: Jessica Mullins MRN: [id number] Date of Birth: 04-Jun-1931 Referring Provider (PT): Dr Lynne Leader   Encounter Date: 07/17/2019  PT End of Session - 07/17/19 1151    Visit Number  3    Number of Visits  8    Date for PT Re-Evaluation  08/05/19    Authorization Type  [employer] M[employer] 10th visit pnote    PT Start Time  1151    PT Stop Time  1245    PT Time Calculation (min)  54 min    Activity Tolerance  Patient tolerated treatment well    Behavior During Therapy  [facility] for tasks assessed/performed       Past Medical History:  Diagnosis Date  . Allergy   . Ankle fracture, left   . Breast cancer ([place]) 1115/16   left   . Bursitis of right shoulder   . Cataract   . Family history of cancer   . Fracture of right wrist   . GERD (gastroesophageal reflux disease)   . Hyperlipidemia   . Hypertension   . Skin cancer 2000   melanoma and basal cell  . Vaso vagal episode    during preparation for colonoscopy    Past Surgical History:  Procedure Laterality Date  . ABDOMINAL HYSTERECTOMY  1988   TAH/BSO--FIBROIDS  . APPENDECTOMY    . BREAST LUMPECTOMY     B/L--FCS  . BREAST LUMPECTOMY WITH RADIOACTIVE SEED AND SENTINEL LYMPH NODE BIOPSY Left 06/11/2015   Procedure: LEFT BREAST LUMPECTOMY WITH RADIOACTIVE SEED AND LEFT SENTINEL LYMPH NODE MAPPING;  Surgeon: Erroll Luna, MD;  Location: [facility];  Service: General;  Laterality: Left;  . CATARACT EXTRACTION  2015  . COLONOSCOPY  2010  . DG  BONE DENSITY ([place] HX)    . EYE SURGERY Bilateral    cataracts  . fiberadenoma Bilateral 1978, 1980  . GANGLION CYST EXCISION     L  hand  . Lipiflow procedure      There were no vitals filed for this visit.  Subjective Assessment - 07/17/19 1152    Subjective  pt reports she thinks the DN  helped, having less pain now, she doesn't feel like she needs to use the walker in the morning, using the cane as needed. 0.50. Pt notified she will be able to move into [facility] in ~ 35months - an apartment opened up    Patient Stated Goals  walk for exercise, exercise on line - a program she did every morning    Currently in Pain?  --   0.50/10 buttocks Rt                       [facility] Adult PT Treatment/Exercise - 07/17/19 0001      Lumbar Exercises: Stretches   Active Hamstring Stretch  Right;Left;30 seconds   supine with strap   ITB Stretch  Left;Right  Required assistance with the Rt LE d/t pain on stretching     Lumbar Exercises: Aerobic   Nustep  L5x6'      Lumbar Exercises: Standing   Other Standing Lumbar Exercises  dead lifts from 8" step 3x5 with 8# VC for form      Lumbar Exercises: Seated   Long Arc Quad on Chair  Strengthening;Both;10 reps    LAQ  on Chair Limitations  sitting on rocker board, then LAQ with opposite arm flexion    Other Seated Lumbar Exercises  on rocker board, red band scapular retraction single and bilat, horizontal abduction, trunk rotations, and side bends       Lumbar Exercises: Supine   Bridge with clamshell  20 reps   with red band around knees   Isometric Hip Flexion  20 reps;3 seconds      Modalities   Modalities  Moist Heat      Moist Heat Therapy   Number Minutes Moist Heat  15 Minutes    Moist Heat Location  Lumbar Spine                  PT Long Term Goals - 07/08/19 1316      PT LONG TERM GOAL #1   Title  patient to be independent with balance and strengthening HEP ( 08/05/2019)    Time  4    Period  Weeks    Status  New    Target Date  08/05/19      PT LONG TERM GOAL #2   Title  improve TUG =/< 12 sec to decrease risk of falls. ( 08/05/2019)    Time  4    Period  Weeks    Status  New    Target Date  08/05/19      PT LONG TERM GOAL #3   Title  improve Rt hip strength =/> 4+/5 to allow safe  ambulation with her walking stick  ( 08/05/2019)    Time  4    Period  Weeks    Status  New    Target Date  08/05/19      PT LONG TERM GOAL #4   Title  improve DGI =/> 20/24 to decrease risk of falls ( 08/05/2019)    Time  4    Period  Weeks    Status  New    Target Date  08/05/19      PT LONG TERM GOAL #5   Title  report =/> 50% reduction of Rt buttock pain in the morning ( 08/05/2019)    Time  4    Period  Weeks    Status  New    Target Date  08/05/19            Plan - 07/17/19 1234    Clinical Impression Statement  Ikesha reports some relief of pain in her low back after DN last session.  She is weak in her hips and core and required rest breaks with exercise.  She is also very tight in her TFL/ITB and lateral gluts and would benefit from some more stretching of these.    Rehab Potential  Excellent    PT Frequency  2x / week    PT Duration  4 weeks    PT Treatment/Interventions  Iontophoresis 4mg /ml Dexamethasone;Taping;Patient/family education;Functional mobility training;Moist Heat;Passive range of motion;Ultrasound;Cryotherapy;Therapeutic exercise;Balance training;Neuromuscular re-education;Manual techniques;Dry needling    PT Next Visit Plan  DN PRN and progress HEP    Consulted and Agree with Plan of Care  Patient       Patient will benefit from skilled therapeutic intervention in order to improve the following deficits and impairments:  Abnormal gait, Increased muscle spasms, Pain, Decreased balance, Decreased strength  Visit Diagnosis: Unsteadiness on feet  Difficulty in walking, not elsewhere classified  Chronic bilateral low back pain without sciatica  Other muscle spasm  Other abnormalities of gait and  mobility  Repeated falls     Problem List Patient Active Problem List   Diagnosis Date Noted  . Balance disorder 04/05/2019  . Constipation by delayed colonic transit 12/13/2017  . Hemorrhoids 02/14/2017  . Atrial fibrillation ([place]) 08/10/2015  .  Orthostatic hypotension 08/04/2015  . Leukocytosis 07/27/2015  . Genetic testing 07/27/2015  . Family history of cancer   . Cancer of central portion of left female breast ([place]) 06/01/2015  . Bilateral low back pain without sciatica 11/18/2014  . Right shoulder pain 04/10/2014  . Muscle spasm of back 01/13/2014  . Olecranon bursitis of left elbow 08/26/2013  . Posterolateral cervical muscle strain 12/14/2012  . Hyperlipidemia 02/16/2010  . TMJ PAIN 02/16/2010  . SEBORRHEIC KERATOSIS 08/24/2009  . Essential hypertension 10/18/2007  . OSTEOPENIA 07/12/2007  . Melanoma of skin ([place]) 10/17/2006  . BREAST CYST 10/17/2006  . COLONOSCOPY, HX OF 10/17/2006    Jeral Pinch PT  07/17/2019, 12:36 PM  [place], [place], [address] Phone: [phone]   Fax:  [phone]  Name: Breta L Goodness MRN: [id number] Date of Birth: [birth year]

## 2019-07-18 ENCOUNTER — Ambulatory Visit: Payer: Medicare PPO | Attending: Internal Medicine

## 2019-07-18 DIAGNOSIS — Z23 Encounter for immunization: Secondary | ICD-10-CM

## 2019-07-18 NOTE — Progress Notes (Signed)
   Covid-19 Vaccination Clinic  Name:  Jessica Mullins    MRN: [id number] DOB: 1930/10/16  07/18/2019  [name] was observed post Covid-19 immunization for 15 minutes without incidence. She was provided with Vaccine Information Sheet and instruction to access the V-Safe system.   [name] was instructed to call 911 with any severe reactions post vaccine: [name] Difficulty breathing  . Swelling of your face and throat  . A fast heartbeat  . A bad rash all over your body  . Dizziness and weakness    Immunizations Administered    Name Date Dose VIS Date Route   Pfizer COVID-19 Vaccine 07/18/2019 11:37 AM 0.3 mL 06/07/2019 Intramuscular   Manufacturer: [place]   Lot: [id number]   [place]: [id number]

## 2019-07-21 NOTE — Progress Notes (Signed)
Patient Care Team: Carollee Herter, Alferd Apa, DO as PCP - General Nicholas Lose, MD as Consulting Physician (Hematology and Oncology) Allyn Kenner, MD as Consulting Physician (Dermatology) Monna Fam, MD as Consulting Physician (Ophthalmology)  DIAGNOSIS:    ICD-10-CM   1. Malignant neoplasm of central portion of left breast in female, estrogen receptor negative ([place])  C50.112    Z17.1     SUMMARY OF ONCOLOGIC HISTORY: Oncology History  Cancer of central portion of left female breast ([place])  05/04/2015 Mammogram    left breast distortion , breast density category A , 7 mm by ultrasound at 3:00 position middle depth   05/12/2015 Initial Diagnosis    left breast biopsy: invasive ductal cancer with DCIS , ER 0%, PR 0%, Ki-67 5%, HER-2 negative ratio 0.83   06/11/2015 Surgery   Left lumpectomy ([place]): DCIS, left additional margin: IDC 1.3 cm + DCIS 1/4 LN positive, grade 2, margins negative, LVI present, ER 0%, PR 0%, HER-2 negative ratio 0.60, Ki-67 5%, T1cN1a stage II a, Mammaprint high risk luminal B   07/20/2015 - 09/21/2015 Chemotherapy   Taxotere and Cytoxan adjuvant chemotherapy  4 cycles   07/24/2015 Procedure   Genetic testing: Negative [employer] mutation panel   01/21/2016 - 02/17/2016 Radiation Therapy   Adjuvant radiation therapy Lisbeth Renshaw). Left breast: 42.5 Gy in 17 fractions. Left breast boost: 7.5 Gy in 3 fractions.      CHIEF COMPLIANT: Surveillance of breast cancer  INTERVAL HISTORY: Jessica Mullins is a 86 y.o. with above-mentioned history of triple negative left breast cancer who underwent left lumpectomy, adjuvant chemotherapy, radiation, and is currently on surveillance. Mammogram on 10/30/18 showed probably benign calcifications in the left breast. Mammogram on 05/16/19 showed oil cysts in the left breast and no evidence of malignancy. She presents to the clinic today for annual follow-up.  ALLERGIES:  is allergic to sulfa antibiotics; tape; glucosamine  forte [nutritional supplements]; and latex.  MEDICATIONS:  Current Outpatient Medications  Medication Sig Dispense Refill  . alendronate (FOSAMAX) 70 MG tablet TAKE ONE TABLET BY MOUTH EVERY SEVEN DAYS. TAKE WITH GLASS OF WATER AT NOON ON ASSIGNED DAY ON EMPTY STOMACH 4 tablet 4  . AMBULATORY NON FORMULARY MEDICATION Walker use daily as directed.  Disp 1 Leg pain M79.604 1 each 0  . Biotin (BIOTIN 5000) 5 MG CAPS Take 5 mg by mouth daily.     . Calcium Carbonate-Vitamin D (CALTRATE 600+D) 600-400 MG-UNIT per tablet Take 1 tablet by mouth daily.     . Cholecalciferol (VITAMIN D3) 1000 UNITS CAPS Take 1 capsule by mouth daily.      . cycloSPORINE (RESTASIS) 0.05 % ophthalmic emulsion PLACE 1 DROP INTO BOTH EYES TWO (TWO) TIMES DAILY.    [name] diltiazem (CARDIZEM CD) 180 MG 24 hr capsule TAKE 1 CAPSULE (180 MG TOTAL) BY MOUTH DAILY. 90 capsule 2  . ELIQUIS 5 MG TABS tablet TAKE ONE TABLET BY MOUTH TWICE A DAY 60 tablet 0  . flecainide (TAMBOCOR) 50 MG tablet TAKE ONE TABLET BY MOUTH TWICE A DAY 60 tablet 11  . gabapentin (NEURONTIN) 300 MG capsule Take 1 capsule (300 mg total) by mouth at bedtime as needed (leg pain). 90 capsule 3  . HYDROcodone-acetaminophen (NORCO/VICODIN) 5-325 MG tablet Take 1 tablet by mouth every 6 (six) hours as needed. 10 tablet 0  . ketoconazole (NIZORAL) 2 % cream Apply 1 application topically daily as needed for irritation. 15 g 2  . methocarbamol (ROBAXIN) 500 MG tablet Take 1  tablet (500 mg total) by mouth 4 (four) times daily. 45 tablet 1  . Multiple Vitamins-Minerals (OCUVITE ADULT 50+) CAPS Take 1 capsule by mouth daily.    . Omega-3 Fatty Acids (FISH OIL PO) Take 1 tablet by mouth daily.     . polyethylene glycol (MIRALAX / GLYCOLAX) packet Take 17 g by mouth daily as needed.    . pravastatin (PRAVACHOL) 40 MG tablet Take 1 tablet (40 mg total) by mouth daily. 90 tablet 1  . Propylene Glycol 0.6 % SOLN Apply 1 drop to eye daily as needed (dry eyes).     . Psyllium  (METAMUCIL PO) Take 15 mLs by mouth daily.     . Turmeric 500 MG CAPS Take 1 capsule by mouth daily.     . vitamin B-12 (CYANOCOBALAMIN) 1000 MCG tablet Take 1 tablet (1,000 mcg total) by mouth daily.     No current facility-administered medications for this visit.    PHYSICAL EXAMINATION: ECOG PERFORMANCE STATUS: 1 - Symptomatic but completely ambulatory  Vitals:   07/22/19 1107  BP: (!) 152/86  Pulse: 67  Resp: 18  Temp: 97.8 F (36.6 C)  SpO2: 100%   Filed Weights   07/22/19 1107  Weight: 156 lb 11.2 oz (71.1 kg)    BREAST: No palpable masses or nodules in either right or left breasts. No palpable axillary supraclavicular or infraclavicular adenopathy no breast tenderness or nipple discharge. (exam performed in the presence of a chaperone)  LABORATORY DATA:  I have reviewed the data as listed CMP Latest Ref Rng & Units 04/05/2019 11/16/2017 05/08/2017  Glucose 70 - 99 mg/dL 98 92 100(H)  BUN 6 - 23 mg/dL 15 13 11   Creatinine 0.40 - 1.20 mg/dL 0.73 0.82 0.71  Sodium 135 - 145 mEq/L 141 139 141  Potassium 3.5 - 5.1 mEq/L 4.1 3.9 4.1  Chloride 96 - 112 mEq/L 102 102 102  CO2 19 - 32 mEq/L 33(H) 29 32  Calcium 8.4 - 10.5 mg/dL 10.9(H) 10.0 10.3  Total Protein 6.0 - 8.3 g/dL 6.4 6.3 6.7  Total Bilirubin 0.2 - 1.2 mg/dL 0.6 0.6 0.5  Alkaline Phos 39 - 117 U/L 56 52 57  AST 0 - 37 U/L 18 20 21   ALT 0 - 35 U/L 19 17 24     Lab Results  Component Value Date   WBC 8.0 11/16/2017   HGB 14.9 11/16/2017   HCT 43.9 11/16/2017   MCV 93.9 11/16/2017   PLT 250.0 11/16/2017   NEUTROABS 5.5 11/16/2017    ASSESSMENT & PLAN:  Cancer of central portion of left female breast ([place]) Left lumpectomy 06/11/2015: DCIS, left additional margin: IDC 1.3 cm + DCIS 1/4 LN positive, grade 2, margins negative, LVI present, ER 0%, PR 0%, HER-2 negative ratio 0.60, Ki-67 5%, T1cN1a stage II a , Mammaprint high risk ( 5 year risk 22%,10 year risk 29%) luminal type B, Distant metastasis free  survival at 5 years 88% with chemotherapy. Adjuvant chemotherapy completed 09/21/2015 Taxotere and Cytoxan 4 cycles Adjuvant radiation delayedCompleted 02/17/2016  Breast Cancer Surveillance: 1. Breast exam1/25/2021:Scartissue in the left breast inferior aspect related to prior surgery,  2. Mammogramleft breast: [facility]: 10/30/2018 postoperative changes Breast Density Category A.  Bone density 11/23/2018: T score -1.6: Osteopenia: On Fosamax and calcium and vitamin D    Prior history of atrial fibrillation: Currently onEliquis,Cardizem and flecainide..  Prior history of melanoma  Return to clinic in1 yearfor follow-up and surveillance for long-term survivorship clinic    No orders  of the defined types were placed in this encounter.  The patient has a good understanding of the overall plan. she agrees with it. she will call with any problems that may develop before the next visit here.  Total time spent: 15 mins including face to face time and time spent for planning, charting and coordination of care  Nicholas Lose, MD 07/22/2019  I, Cloyde Reams Dorshimer, am acting as scribe for Dr. Nicholas Lose.  I have reviewed the above documentation for accuracy and completeness, and I agree with the above.

## 2019-07-22 ENCOUNTER — Other Ambulatory Visit: Payer: Self-pay

## 2019-07-22 ENCOUNTER — Inpatient Hospital Stay: Payer: Medicare PPO | Attending: Hematology and Oncology | Admitting: Hematology and Oncology

## 2019-07-22 DIAGNOSIS — Z85820 Personal history of malignant melanoma of skin: Secondary | ICD-10-CM | POA: Diagnosis not present

## 2019-07-22 DIAGNOSIS — C50112 Malignant neoplasm of central portion of left female breast: Secondary | ICD-10-CM | POA: Diagnosis not present

## 2019-07-22 DIAGNOSIS — M8580 Other specified disorders of bone density and structure, unspecified site: Secondary | ICD-10-CM | POA: Diagnosis not present

## 2019-07-22 DIAGNOSIS — Z171 Estrogen receptor negative status [ER-]: Secondary | ICD-10-CM | POA: Diagnosis not present

## 2019-07-22 DIAGNOSIS — Z7901 Long term (current) use of anticoagulants: Secondary | ICD-10-CM | POA: Diagnosis not present

## 2019-07-22 DIAGNOSIS — Z853 Personal history of malignant neoplasm of breast: Secondary | ICD-10-CM | POA: Diagnosis not present

## 2019-07-22 DIAGNOSIS — I4891 Unspecified atrial fibrillation: Secondary | ICD-10-CM | POA: Insufficient documentation

## 2019-07-22 NOTE — Assessment & Plan Note (Signed)
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 Adjuvant chemotherapy completed 09/21/2015 Taxotere and Cytoxan 4 cycles Adjuvant radiation delayedCompleted 02/17/2016  Breast Cancer Surveillance: 1. Breast exam1/25/2021:Scartissue in the left breast inferior aspect related to prior surgery,  2. Mammogramleft breast: [facility]: 10/30/2018 postoperative changes Breast Density Category A.  Bone density 11/23/2018: T score -1.6: Osteopenia: On Fosamax and calcium and vitamin D  Return to clinic in 1 year for follow-up  Prior history of atrial fibrillation: Currently onEliquis,Cardizem and flecainide..  Prior history of melanoma  Return to clinic in1 yearfor follow-up and surveillance

## 2019-07-23 ENCOUNTER — Telehealth: Payer: Self-pay | Admitting: Adult Health

## 2019-07-23 NOTE — Telephone Encounter (Signed)
Scheduled per 1/25 los. Called and spoke with pt, confirmed 2/2 appt

## 2019-07-24 ENCOUNTER — Ambulatory Visit: Payer: Medicare PPO | Admitting: Physical Therapy

## 2019-07-24 ENCOUNTER — Other Ambulatory Visit: Payer: Self-pay

## 2019-07-24 ENCOUNTER — Encounter: Payer: Self-pay | Admitting: Physical Therapy

## 2019-07-24 DIAGNOSIS — M62838 Other muscle spasm: Secondary | ICD-10-CM

## 2019-07-24 DIAGNOSIS — G8929 Other chronic pain: Secondary | ICD-10-CM

## 2019-07-24 DIAGNOSIS — M545 Low back pain, unspecified: Secondary | ICD-10-CM

## 2019-07-24 DIAGNOSIS — R262 Difficulty in walking, not elsewhere classified: Secondary | ICD-10-CM | POA: Diagnosis not present

## 2019-07-24 DIAGNOSIS — R296 Repeated falls: Secondary | ICD-10-CM

## 2019-07-24 DIAGNOSIS — R2689 Other abnormalities of gait and mobility: Secondary | ICD-10-CM

## 2019-07-24 DIAGNOSIS — R2681 Unsteadiness on feet: Secondary | ICD-10-CM

## 2019-07-24 NOTE — Therapy (Signed)
[place], [place], [address] Phone: [phone]   Fax:  [phone]  Physical Therapy Treatment  Patient Details  Name: Jessica Mullins MRN: [id number] Date of Birth: August 25, 1930 Referring Provider (PT): Dr Lynne Leader   Encounter Date: 07/24/2019  PT End of Session - 07/24/19 1235    Visit Number  4    Number of Visits  8    Date for PT Re-Evaluation  08/05/19    Authorization Type  [employer] MCR 10th visit pnote    PT Start Time  [id number]    PT Stop Time  1324   heat at end   PT Time Calculation (min)  49 min    Activity Tolerance  Patient tolerated treatment well       Past Medical History:  Diagnosis Date  . Allergy   . Ankle fracture, left   . Breast cancer ([place]) 1115/16   left   . Bursitis of right shoulder   . Cataract   . Family history of cancer   . Fracture of right wrist   . GERD (gastroesophageal reflux disease)   . Hyperlipidemia   . Hypertension   . Skin cancer 2000   melanoma and basal cell  . Vaso vagal episode    during preparation for colonoscopy    Past Surgical History:  Procedure Laterality Date  . ABDOMINAL HYSTERECTOMY  1988   TAH/BSO--FIBROIDS  . APPENDECTOMY    . BREAST LUMPECTOMY     B/L--FCS  . BREAST LUMPECTOMY WITH RADIOACTIVE SEED AND SENTINEL LYMPH NODE BIOPSY Left 06/11/2015   Procedure: LEFT BREAST LUMPECTOMY WITH RADIOACTIVE SEED AND LEFT SENTINEL LYMPH NODE MAPPING;  Surgeon: Erroll Luna, MD;  Location: [facility];  Service: General;  Laterality: Left;  . CATARACT EXTRACTION  2015  . COLONOSCOPY  2010  . DG  BONE DENSITY ([place] HX)    . EYE SURGERY Bilateral    cataracts  . fiberadenoma Bilateral 1978, 1980  . GANGLION CYST EXCISION     L  hand  . Lipiflow procedure      There were no vitals filed for this visit.  Subjective Assessment - 07/24/19 1236    Subjective  Pt reports she doesnt' understand what is wrong with her, her legs just feel  like rubber.  The only thing she is doing at home right now is self trigger point release with the ball.  She stopped doing the stretch becaus she had some pain with that.  She fell on Sunday - states one minute she was up and the next down.  didn't hurt anyting.    Patient Stated Goals  walk for exercise, exercise on line - a program she did every morning    Currently in Pain?  No/denies   not now        [facility] PT Assessment - 07/24/19 0001      Assessment   Medical Diagnosis  lumbar radiculopathy and balance d/o    Referring Provider (PT)  Dr Lynne Leader                   [facility] Adult PT Treatment/Exercise - 07/24/19 0001      Lumbar Exercises: Aerobic   Nustep  L5x6'      Lumbar Exercises: Machines for Strengthening   Other Lumbar Machine Exercise  10 reps each with 15# lat pull down and seated row.       Lumbar Exercises: Standing  Row  Strengthening;Both;10 reps;Theraband    Theraband Level (Row)  Level 4 (Blue)    Shoulder ADduction  Strengthening;Both;10 reps;Theraband   and abdcution   Theraband Level (Shoulder Adduction)  Level 4 (Blue)    Other Standing Lumbar Exercises  standing 4 wayleg reach with unilateral counter support 2x10 each side    Other Standing Lumbar Exercises  with one foot on 8" step, other on floor,  FWD reach with ball and side side with ball, close supervison       Lumbar Exercises: Seated   Sit to Stand  20 reps   no UE assist     Modalities   Modalities  Moist Heat      Moist Heat Therapy   Number Minutes Moist Heat  --   15   Moist Heat Location  Lumbar Spine             PT Education - 07/24/19 1251    Education Details  HEP progression    Person(s) Educated  Patient    Methods  Explanation;Demonstration;Handout    Comprehension  Returned demonstration;Verbalized understanding          PT Long Term Goals - 07/08/19 1316      PT LONG TERM GOAL #1   Title  patient to be independent with balance and  strengthening HEP ( 08/05/2019)    Time  4    Period  Weeks    Status  New    Target Date  08/05/19      PT LONG TERM GOAL #2   Title  improve TUG =/< 12 sec to decrease risk of falls. ( 08/05/2019)    Time  4    Period  Weeks    Status  New    Target Date  08/05/19      PT LONG TERM GOAL #3   Title  improve Rt hip strength =/> 4+/5 to allow safe ambulation with her walking stick  ( 08/05/2019)    Time  4    Period  Weeks    Status  New    Target Date  08/05/19      PT LONG TERM GOAL #4   Title  improve DGI =/> 20/24 to decrease risk of falls ( 08/05/2019)    Time  4    Period  Weeks    Status  New    Target Date  08/05/19      PT LONG TERM GOAL #5   Title  report =/> 50% reduction of Rt buttock pain in the morning ( 08/05/2019)    Time  4    Period  Weeks    Status  New    Target Date  08/05/19            Plan - 07/24/19 1313    Clinical Impression Statement  Jessica Mullins is worried about why she feels so weak.  she was encouraged to call her MD if the feeling continues and maybe they can check her blood work.  She does still have some antalgic gait when she walks in the clinic without her walking stick.  She was very challenged with standing one foot on step while performing UE movements and would benefit from more of this type of work    Rehab Potential  Excellent    PT Frequency  2x / week    PT Duration  4 weeks    PT Treatment/Interventions  Iontophoresis 4mg /ml Dexamethasone;Taping;Patient/family education;Functional mobility training;Moist Heat;Passive range of motion;Ultrasound;Cryotherapy;Therapeutic  exercise;Balance training;Neuromuscular re-education;Manual techniques;Dry needling    PT Next Visit Plan  core and balance    Consulted and Agree with Plan of Care  Patient       Patient will benefit from skilled therapeutic intervention in order to improve the following deficits and impairments:  Abnormal gait, Increased muscle spasms, Pain, Decreased balance, Decreased  strength  Visit Diagnosis: Unsteadiness on feet  Difficulty in walking, not elsewhere classified  Chronic bilateral low back pain without sciatica  Other muscle spasm  Other abnormalities of gait and mobility  Repeated falls     Problem List Patient Active Problem List   Diagnosis Date Noted  . Balance disorder 04/05/2019  . Constipation by delayed colonic transit 12/13/2017  . Hemorrhoids 02/14/2017  . Atrial fibrillation ([place]) 08/10/2015  . Orthostatic hypotension 08/04/2015  . Leukocytosis 07/27/2015  . Genetic testing 07/27/2015  . Family history of cancer   . Cancer of central portion of left female breast ([place]) 06/01/2015  . Bilateral low back pain without sciatica 11/18/2014  . Right shoulder pain 04/10/2014  . Muscle spasm of back 01/13/2014  . Olecranon bursitis of left elbow 08/26/2013  . Posterolateral cervical muscle strain 12/14/2012  . Hyperlipidemia 02/16/2010  . TMJ PAIN 02/16/2010  . SEBORRHEIC KERATOSIS 08/24/2009  . Essential hypertension 10/18/2007  . OSTEOPENIA 07/12/2007  . Melanoma of skin ([place]) 10/17/2006  . BREAST CYST 10/17/2006  . COLONOSCOPY, HX OF 10/17/2006    Boneta Lucks rPT  07/24/2019, 1:15 PM  [place], [place], [address] Phone: [phone]   Fax:  [phone]  Name: Jessica Mullins MRN: [id number] Date of Birth: 08-04-1930

## 2019-07-26 ENCOUNTER — Ambulatory Visit: Payer: Medicare PPO | Admitting: Physical Therapy

## 2019-07-26 ENCOUNTER — Other Ambulatory Visit: Payer: Self-pay

## 2019-07-26 DIAGNOSIS — R296 Repeated falls: Secondary | ICD-10-CM | POA: Diagnosis not present

## 2019-07-26 DIAGNOSIS — G8929 Other chronic pain: Secondary | ICD-10-CM | POA: Diagnosis not present

## 2019-07-26 DIAGNOSIS — M545 Low back pain, unspecified: Secondary | ICD-10-CM

## 2019-07-26 DIAGNOSIS — R262 Difficulty in walking, not elsewhere classified: Secondary | ICD-10-CM | POA: Diagnosis not present

## 2019-07-26 DIAGNOSIS — R2689 Other abnormalities of gait and mobility: Secondary | ICD-10-CM | POA: Diagnosis not present

## 2019-07-26 DIAGNOSIS — R2681 Unsteadiness on feet: Secondary | ICD-10-CM

## 2019-07-26 DIAGNOSIS — M62838 Other muscle spasm: Secondary | ICD-10-CM

## 2019-07-26 NOTE — Therapy (Signed)
[place], [place], [address] Phone: [phone]   Fax:  [phone]  Physical Therapy Treatment  Patient Details  Name: Jessica Mullins MRN: [id number] Date of Birth: 10-16-1930 Referring Provider (PT): Dr Lynne Leader   Encounter Date: 07/26/2019  PT End of Session - 07/26/19 1134    Visit Number  5    Date for PT Re-Evaluation  08/05/19    Authorization Type  [employer] MCR 10th visit pnote    PT Start Time  1100    PT Stop Time  1140    PT Time Calculation (min)  40 min       Past Medical History:  Diagnosis Date  . Allergy   . Ankle fracture, left   . Breast cancer ([place]) 1115/16   left   . Bursitis of right shoulder   . Cataract   . Family history of cancer   . Fracture of right wrist   . GERD (gastroesophageal reflux disease)   . Hyperlipidemia   . Hypertension   . Skin cancer 2000   melanoma and basal cell  . Vaso vagal episode    during preparation for colonoscopy    Past Surgical History:  Procedure Laterality Date  . ABDOMINAL HYSTERECTOMY  1988   TAH/BSO--FIBROIDS  . APPENDECTOMY    . BREAST LUMPECTOMY     B/L--FCS  . BREAST LUMPECTOMY WITH RADIOACTIVE SEED AND SENTINEL LYMPH NODE BIOPSY Left 06/11/2015   Procedure: LEFT BREAST LUMPECTOMY WITH RADIOACTIVE SEED AND LEFT SENTINEL LYMPH NODE MAPPING;  Surgeon: Erroll Luna, MD;  Location: [facility];  Service: General;  Laterality: Left;  . CATARACT EXTRACTION  2015  . COLONOSCOPY  2010  . DG  BONE DENSITY ([place] HX)    . EYE SURGERY Bilateral    cataracts  . fiberadenoma Bilateral 1978, 1980  . GANGLION CYST EXCISION     L  hand  . Lipiflow procedure      There were no vitals filed for this visit.  Subjective Assessment - 07/26/19 1103    Subjective  worn out today- spent all day getting my house ready to sell. walking pretty good today- good and bad days, need to get stronger    Currently in Pain?  No/denies                        [facility] Adult PT Treatment/Exercise - 07/26/19 0001      High Level Balance   High Level Balance Activities  Backward walking;Side stepping;Marching backwards   HHA red tband     Lumbar Exercises: Aerobic   Nustep  L5x6'      Lumbar Exercises: Machines for Strengthening   Cybex Knee Extension  5# 2 sets 10    Cybex Knee Flexion  15# 2 sets 10    Other Lumbar Machine Exercise  rows and lats 20# 2 sets 10      Lumbar Exercises: Standing   Other Standing Lumbar Exercises  red tband HHA alt LE hip flex,ext and abd      Lumbar Exercises: Seated   Other Seated Lumbar Exercises  hip abd green tband 2 sets 10    Other Seated Lumbar Exercises  add ball squeeze 3 sec hold 15 times                  PT Long Term Goals - 07/26/19 1131      PT LONG TERM  GOAL #1   Title  patient to be independent with balance and strengthening HEP ( 08/05/2019)    Status  On-going      PT LONG TERM GOAL #2   Title  improve TUG =/< 12 sec to decrease risk of falls. ( 08/05/2019)    Baseline  TUG without AD 11.4 sec with 1 LOB but regained independantly    Status  Partially Met      PT LONG TERM GOAL #3   Title  improve Rt hip strength =/> 4+/5 to allow safe ambulation with her walking stick  ( 08/05/2019)    Status  On-going      PT LONG TERM GOAL #4   Title  improve DGI =/> 20/24 to decrease risk of falls ( 08/05/2019)    Status  On-going      PT LONG TERM GOAL #5   Title  report =/> 50% reduction of Rt buttock pain in the morning ( 08/05/2019)    Status  On-going            Plan - 07/26/19 1134    Clinical Impression Statement  Progressing with goals. TUG goal met for time but did have 1 LOB but regained, she struggled with HHA alt LE ex d/t balance and ABD hip weakness noted. Added machines for strengthening today as wekaness is her chief complaint. HHA alt LE ex added for balance and strength but pt needed assistance and felt very unsteady.    PT  Treatment/Interventions  Iontophoresis 42m/ml Dexamethasone;Taping;Patient/family education;Functional mobility training;Moist Heat;Passive range of motion;Ultrasound;Cryotherapy;Therapeutic exercise;Balance training;Neuromuscular re-education;Manual techniques;Dry needling    PT Next Visit Plan  core, hips  and balance       Patient will benefit from skilled therapeutic intervention in order to improve the following deficits and impairments:  Abnormal gait, Increased muscle spasms, Pain, Decreased balance, Decreased strength  Visit Diagnosis: Difficulty in walking, not elsewhere classified  Unsteadiness on feet  Chronic bilateral low back pain without sciatica  Other muscle spasm     Problem List Patient Active Problem List   Diagnosis Date Noted  . Balance disorder 04/05/2019  . Constipation by delayed colonic transit 12/13/2017  . Hemorrhoids 02/14/2017  . Atrial fibrillation ([place] 08/10/2015  . Orthostatic hypotension 08/04/2015  . Leukocytosis 07/27/2015  . Genetic testing 07/27/2015  . Family history of cancer   . Cancer of central portion of left female breast ([place] 06/01/2015  . Bilateral low back pain without sciatica 11/18/2014  . Right shoulder pain 04/10/2014  . Muscle spasm of back 01/13/2014  . Olecranon bursitis of left elbow 08/26/2013  . Posterolateral cervical muscle strain 12/14/2012  . Hyperlipidemia 02/16/2010  . TMJ PAIN 02/16/2010  . SEBORRHEIC KERATOSIS 08/24/2009  . Essential hypertension 10/18/2007  . OSTEOPENIA 07/12/2007  . Melanoma of skin ([place] 10/17/2006  . BREAST CYST 10/17/2006  . COLONOSCOPY, HX OF 10/17/2006    Kawanna Christley,ANGIE PTA 07/26/2019, 11:37 AM  [place] [address]Phone: [phone]  Fax:  [phone] Name: Jessica Mullins MRN: [id number] of Birth: [birth year]

## 2019-07-28 DIAGNOSIS — S61411A Laceration without foreign body of right hand, initial encounter: Secondary | ICD-10-CM | POA: Diagnosis not present

## 2019-07-29 ENCOUNTER — Other Ambulatory Visit: Payer: Self-pay | Admitting: Family Medicine

## 2019-07-29 ENCOUNTER — Other Ambulatory Visit (HOSPITAL_COMMUNITY): Payer: Self-pay | Admitting: Internal Medicine

## 2019-07-29 ENCOUNTER — Ambulatory Visit: Payer: Medicare PPO | Admitting: Physical Therapy

## 2019-07-30 ENCOUNTER — Ambulatory Visit: Payer: Medicare PPO | Attending: Family Medicine | Admitting: Physical Therapy

## 2019-07-30 ENCOUNTER — Other Ambulatory Visit: Payer: Self-pay

## 2019-07-30 DIAGNOSIS — R2681 Unsteadiness on feet: Secondary | ICD-10-CM | POA: Diagnosis not present

## 2019-07-30 DIAGNOSIS — M545 Low back pain, unspecified: Secondary | ICD-10-CM

## 2019-07-30 DIAGNOSIS — G8929 Other chronic pain: Secondary | ICD-10-CM | POA: Diagnosis not present

## 2019-07-30 DIAGNOSIS — M62838 Other muscle spasm: Secondary | ICD-10-CM | POA: Insufficient documentation

## 2019-07-30 DIAGNOSIS — R262 Difficulty in walking, not elsewhere classified: Secondary | ICD-10-CM | POA: Diagnosis not present

## 2019-07-30 NOTE — Therapy (Signed)
[place], [place], [address] Phone: [phone]   Fax:  [phone]  Physical Therapy Treatment  Patient Details  Name: Jessica Mullins MRN: [id number] Date of Birth: [birth year] Referring Provider (PT): Dr Lynne Leader   Encounter Date: 07/30/2019  PT End of Session - 07/30/19 1441    Visit Number  6    Number of Visits  8    Date for PT Re-Evaluation  08/05/19    PT Start Time  1400   heat at end of session   PT Stop Time  1452    PT Time Calculation (min)  52 min       Past Medical History:  Diagnosis Date  . Allergy   . Ankle fracture, left   . Breast cancer ([place]) 1115/16   left   . Bursitis of right shoulder   . Cataract   . Family history of cancer   . Fracture of right wrist   . GERD (gastroesophageal reflux disease)   . Hyperlipidemia   . Hypertension   . Skin cancer 2000   melanoma and basal cell  . Vaso vagal episode    during preparation for colonoscopy    Past Surgical History:  Procedure Laterality Date  . ABDOMINAL HYSTERECTOMY  1988   TAH/BSO--FIBROIDS  . APPENDECTOMY    . BREAST LUMPECTOMY     B/L--FCS  . BREAST LUMPECTOMY WITH RADIOACTIVE SEED AND SENTINEL LYMPH NODE BIOPSY Left 06/11/2015   Procedure: LEFT BREAST LUMPECTOMY WITH RADIOACTIVE SEED AND LEFT SENTINEL LYMPH NODE MAPPING;  Surgeon: Erroll Luna, MD;  Location: [facility];  Service: General;  Laterality: Left;  . CATARACT EXTRACTION  2015  . COLONOSCOPY  2010  . DG  BONE DENSITY ([place] HX)    . EYE SURGERY Bilateral    cataracts  . fiberadenoma Bilateral 1978, 1980  . GANGLION CYST EXCISION     L  hand  . Lipiflow procedure      There were no vitals filed for this visit.  Subjective Assessment - 07/30/19 1402    Subjective  alittle stiff in RT hip in mornings, maybe arthritis and being in 1 position    Currently in Pain?  Yes    Pain Score  1     Pain Location  Hip    Pain Orientation  Right                        [facility] Adult PT Treatment/Exercise - 07/30/19 0001      High Level Balance   High Level Balance Activities  Other (comment)    High Level Balance Comments  ball toss on airex, stepping fwd /back/SW with 3# over roll on airex HHA      Lumbar Exercises: Aerobic   Nustep  L5x6'      Lumbar Exercises: Machines for Strengthening   Cybex Knee Extension  5# 2 sets 10    Cybex Knee Flexion  15# 2 sets 10      Lumbar Exercises: Standing   Other Standing Lumbar Exercises  resisted gait 30# min A 5 x fwd/back, 3 x each side   3# alt LE 20 reps 3 way HHA   Other Standing Lumbar Exercises  STS 2 sets 5, on airex no UE- CGA      Modalities   Modalities  Moist Heat      Moist Heat Therapy   Number Minutes Moist  Heat  10 Minutes    Moist Heat Location  Lumbar Spine                  PT Long Term Goals - 07/26/19 1131      PT LONG TERM GOAL #1   Title  patient to be independent with balance and strengthening HEP ( 08/05/2019)    Status  On-going      PT LONG TERM GOAL #2   Title  improve TUG =/< 12 sec to decrease risk of falls. ( 08/05/2019)    Baseline  TUG without AD 11.4 sec with 1 LOB but regained independantly    Status  Partially Met      PT LONG TERM GOAL #3   Title  improve Rt hip strength =/> 4+/5 to allow safe ambulation with her walking stick  ( 08/05/2019)    Status  On-going      PT LONG TERM GOAL #4   Title  improve DGI =/> 20/24 to decrease risk of falls ( 08/05/2019)    Status  On-going      PT LONG TERM GOAL #5   Title  report =/> 50% reduction of Rt buttock pain in the morning ( 08/05/2019)    Status  On-going            Plan - 07/30/19 1442    Clinical Impression Statement  progressed strength and dynamic balanc ex, pt had difficulty with resisted gait and standing on airex, pt has good righting reactions just very unstready and requires assistance and cuing    PT Treatment/Interventions  Iontophoresis 69m/ml  Dexamethasone;Taping;Patient/family education;Functional mobility training;Moist Heat;Passive range of motion;Ultrasound;Cryotherapy;Therapeutic exercise;Balance training;Neuromuscular re-education;Manual techniques;Dry needling    PT Next Visit Plan  core, hips  and balance, assess goals       Patient will benefit from skilled therapeutic intervention in order to improve the following deficits and impairments:  Abnormal gait, Increased muscle spasms, Pain, Decreased balance, Decreased strength  Visit Diagnosis: Difficulty in walking, not elsewhere classified  Unsteadiness on feet  Chronic bilateral low back pain without sciatica     Problem List Patient Active Problem List   Diagnosis Date Noted  . Balance disorder 04/05/2019  . Constipation by delayed colonic transit 12/13/2017  . Hemorrhoids 02/14/2017  . Atrial fibrillation ([place] 08/10/2015  . Orthostatic hypotension 08/04/2015  . Leukocytosis 07/27/2015  . Genetic testing 07/27/2015  . Family history of cancer   . Cancer of central portion of left female breast ([place] 06/01/2015  . Bilateral low back pain without sciatica 11/18/2014  . Right shoulder pain 04/10/2014  . Muscle spasm of back 01/13/2014  . Olecranon bursitis of left elbow 08/26/2013  . Posterolateral cervical muscle strain 12/14/2012  . Hyperlipidemia 02/16/2010  . TMJ PAIN 02/16/2010  . SEBORRHEIC KERATOSIS 08/24/2009  . Essential hypertension 10/18/2007  . OSTEOPENIA 07/12/2007  . Melanoma of skin ([place] 10/17/2006  . BREAST CYST 10/17/2006  . COLONOSCOPY, HX OF 10/17/2006    Moria Brophy,ANGIE PTA 07/30/2019, 2:48 PM  [place] [address]Phone: [phone]  Fax:  [phone] Name: Sherlyn L Eastburn MRN: [id number] of Birth: [birth year]

## 2019-08-01 ENCOUNTER — Encounter: Payer: Self-pay | Admitting: Physical Therapy

## 2019-08-01 ENCOUNTER — Ambulatory Visit: Payer: Medicare PPO | Admitting: Physical Therapy

## 2019-08-01 ENCOUNTER — Other Ambulatory Visit: Payer: Self-pay

## 2019-08-01 DIAGNOSIS — R262 Difficulty in walking, not elsewhere classified: Secondary | ICD-10-CM | POA: Diagnosis not present

## 2019-08-01 DIAGNOSIS — M62838 Other muscle spasm: Secondary | ICD-10-CM

## 2019-08-01 DIAGNOSIS — R2681 Unsteadiness on feet: Secondary | ICD-10-CM

## 2019-08-01 DIAGNOSIS — G8929 Other chronic pain: Secondary | ICD-10-CM

## 2019-08-01 DIAGNOSIS — M545 Low back pain, unspecified: Secondary | ICD-10-CM

## 2019-08-01 NOTE — Therapy (Signed)
[facility] [place], [place], [address] Phone: [phone]   Fax:  [phone]  Physical Therapy Treatment  Patient Details  Name: Jessica Mullins MRN: [id number] Date of Birth: May 06, 1931 Referring Provider (PT): Dr Lynne Leader   Encounter Date: 08/01/2019  PT End of Session - 08/01/19 1139    Visit Number  7    Date for PT Re-Evaluation  09/03/19    Authorization Type  [employer] MCR 10th visit pnote    PT Start Time  1059   heat   PT Stop Time  1155    PT Time Calculation (min)  56 min    Activity Tolerance  Patient tolerated treatment well    Behavior During Therapy  [facility] for tasks assessed/performed       Past Medical History:  Diagnosis Date  . Allergy   . Ankle fracture, left   . Breast cancer ([place]) 1115/16   left   . Bursitis of right shoulder   . Cataract   . Family history of cancer   . Fracture of right wrist   . GERD (gastroesophageal reflux disease)   . Hyperlipidemia   . Hypertension   . Skin cancer 2000   melanoma and basal cell  . Vaso vagal episode    during preparation for colonoscopy    Past Surgical History:  Procedure Laterality Date  . ABDOMINAL HYSTERECTOMY  1988   TAH/BSO--FIBROIDS  . APPENDECTOMY    . BREAST LUMPECTOMY     B/L--FCS  . BREAST LUMPECTOMY WITH RADIOACTIVE SEED AND SENTINEL LYMPH NODE BIOPSY Left 06/11/2015   Procedure: LEFT BREAST LUMPECTOMY WITH RADIOACTIVE SEED AND LEFT SENTINEL LYMPH NODE MAPPING;  Surgeon: Erroll Luna, MD;  Location: [place];  Service: General;  Laterality: Left;  . CATARACT EXTRACTION  2015  . COLONOSCOPY  2010  . DG  BONE DENSITY ([place] HX)    . EYE SURGERY Bilateral    cataracts  . fiberadenoma Bilateral 1978, 1980  . GANGLION CYST EXCISION     L  hand  . Lipiflow procedure      There were no vitals filed for this visit.  Subjective Assessment - 08/01/19 1101    Subjective  Reports that she just went grocery shopping  and is a little tired, "weak in the legs"    Currently in Pain?  Yes    Pain Score  1     Pain Location  Hip    Pain Orientation  Right    Pain Descriptors / Indicators  Tightness    Aggravating Factors   activity                       [facility] Adult PT Treatment/Exercise - 08/01/19 0001      High Level Balance   High Level Balance Comments  ball kicks      Lumbar Exercises: Aerobic   Nustep  L5x6'      Lumbar Exercises: Machines for Strengthening   Cybex Knee Extension  5# 2 sets 10    Cybex Knee Flexion  15# 2 sets 10    Leg Press  20# 2x10      Lumbar Exercises: Standing   Other Standing Lumbar Exercises  resisted gait all directions    Other Standing Lumbar Exercises  2.5# hip marches, abduction and extension      Modalities   Modalities  Moist Heat      Moist Heat Therapy  Number Minutes Moist Heat  10 Minutes    Moist Heat Location  Lumbar Spine                  PT Long Term Goals - 08/01/19 1147      PT LONG TERM GOAL #1   Title  patient to be independent with balance and strengthening HEP ( 08/05/2019)    Status  Partially Met      PT LONG TERM GOAL #2   Title  improve TUG =/< 12 sec to decrease risk of falls. ( 08/05/2019)    Status  Partially Met      PT LONG TERM GOAL #3   Title  improve Rt hip strength =/> 4+/5 to allow safe ambulation with her walking stick  ( 08/05/2019)    Status  Partially Met      PT LONG TERM GOAL #4   Title  improve DGI =/> 20/24 to decrease risk of falls ( 08/05/2019)    Status  Partially Met      PT LONG TERM GOAL #5   Title  report =/> 50% reduction of Rt buttock pain in the morning ( 08/05/2019)    Status  Partially Met            Plan - 08/01/19 1145    Clinical Impression Statement  Patient tolerating activities well and the progression to more strengthening, she still reports times when her legs feel weak and shaky as well as times when she feels off balance.  She has a lot of difficulty on  dynamic surfaces.  She does plan on moving in the next few months.  We will work on strength and balance for her move    PT Frequency  2x / week    PT Duration  4 weeks    PT Treatment/Interventions  Iontophoresis 103m/ml Dexamethasone;Taping;Patient/family education;Functional mobility training;Moist Heat;Passive range of motion;Ultrasound;Cryotherapy;Therapeutic exercise;Balance training;Neuromuscular re-education;Manual techniques;Dry needling    PT Next Visit Plan  work on strength of the legs, hips and core as well as balance    Consulted and Agree with Plan of Care  Patient       Patient will benefit from skilled therapeutic intervention in order to improve the following deficits and impairments:  Abnormal gait, Increased muscle spasms, Pain, Decreased balance, Decreased strength  Visit Diagnosis: Difficulty in walking, not elsewhere classified - Plan: PT plan of care cert/re-cert  Unsteadiness on feet - Plan: PT plan of care cert/re-cert  Chronic bilateral low back pain without sciatica - Plan: PT plan of care cert/re-cert  Other muscle spasm - Plan: PT plan of care cert/re-cert     Problem List Patient Active Problem List   Diagnosis Date Noted  . Balance disorder 04/05/2019  . Constipation by delayed colonic transit 12/13/2017  . Hemorrhoids 02/14/2017  . Atrial fibrillation ([place] 08/10/2015  . Orthostatic hypotension 08/04/2015  . Leukocytosis 07/27/2015  . Genetic testing 07/27/2015  . Family history of cancer   . Cancer of central portion of left female breast ([facility] 06/01/2015  . Bilateral low back pain without sciatica 11/18/2014  . Right shoulder pain 04/10/2014  . Muscle spasm of back 01/13/2014  . Olecranon bursitis of left elbow 08/26/2013  . Posterolateral cervical muscle strain 12/14/2012  . Hyperlipidemia 02/16/2010  . TMJ PAIN 02/16/2010  . SEBORRHEIC KERATOSIS 08/24/2009  . Essential hypertension 10/18/2007  . OSTEOPENIA 07/12/2007  . Melanoma of skin  ([place] 10/17/2006  . BREAST CYST 10/17/2006  . COLONOSCOPY, HX OF  10/17/2006    Sumner Boast., PT 08/01/2019, 11:50 AM  [address] [place], [place], [address] Phone: [phone]   Fax:  [phone]  Name: Jessica Mullins MRN: [id number] Date of Birth: 1931/06/02

## 2019-08-05 ENCOUNTER — Encounter: Payer: Medicare PPO | Admitting: Physical Therapy

## 2019-08-06 ENCOUNTER — Other Ambulatory Visit: Payer: Self-pay

## 2019-08-06 ENCOUNTER — Ambulatory Visit: Payer: Medicare PPO | Admitting: Physical Therapy

## 2019-08-06 DIAGNOSIS — G8929 Other chronic pain: Secondary | ICD-10-CM

## 2019-08-06 DIAGNOSIS — M545 Low back pain, unspecified: Secondary | ICD-10-CM

## 2019-08-06 DIAGNOSIS — R2681 Unsteadiness on feet: Secondary | ICD-10-CM | POA: Diagnosis not present

## 2019-08-06 DIAGNOSIS — R262 Difficulty in walking, not elsewhere classified: Secondary | ICD-10-CM | POA: Diagnosis not present

## 2019-08-06 DIAGNOSIS — M62838 Other muscle spasm: Secondary | ICD-10-CM | POA: Diagnosis not present

## 2019-08-06 NOTE — Therapy (Signed)
[place], [place], [address] Phone: [phone]   Fax:  [phone]  Physical Therapy Treatment  Patient Details  Name: Jessica Mullins MRN: [id number] Date of Birth: [birth year] Referring Provider (PT): Dr Lynne Leader   Encounter Date: 08/06/2019  PT End of Session - 08/06/19 1223    Visit Number  8    Date for PT Re-Evaluation  09/03/19    PT Start Time  1147    PT Stop Time  1237    PT Time Calculation (min)  50 min       Past Medical History:  Diagnosis Date  . Allergy   . Ankle fracture, left   . Breast cancer ([place]) 1115/16   left   . Bursitis of right shoulder   . Cataract   . Family history of cancer   . Fracture of right wrist   . GERD (gastroesophageal reflux disease)   . Hyperlipidemia   . Hypertension   . Skin cancer 2000   melanoma and basal cell  . Vaso vagal episode    during preparation for colonoscopy    Past Surgical History:  Procedure Laterality Date  . ABDOMINAL HYSTERECTOMY  1988   TAH/BSO--FIBROIDS  . APPENDECTOMY    . BREAST LUMPECTOMY     B/L--FCS  . BREAST LUMPECTOMY WITH RADIOACTIVE SEED AND SENTINEL LYMPH NODE BIOPSY Left 06/11/2015   Procedure: LEFT BREAST LUMPECTOMY WITH RADIOACTIVE SEED AND LEFT SENTINEL LYMPH NODE MAPPING;  Surgeon: Erroll Luna, MD;  Location: [place];  Service: General;  Laterality: Left;  . CATARACT EXTRACTION  2015  . COLONOSCOPY  2010  . DG  BONE DENSITY ([place] HX)    . EYE SURGERY Bilateral    cataracts  . fiberadenoma Bilateral 1978, 1980  . GANGLION CYST EXCISION     L  hand  . Lipiflow procedure      There were no vitals filed for this visit.  Subjective Assessment - 08/06/19 1149    Subjective  getting house ready to sell so busy , other than that I am okay    Currently in Pain?  No/denies                       [facility] Adult PT Treatment/Exercise - 08/06/19 0001      High Level Balance   High  Level Balance Activities  Negotitating around obstacles;Negotiating over obstacles      Lumbar Exercises: Aerobic   Recumbent Bike  5 min      Lumbar Exercises: Machines for Strengthening   Cybex Lumbar Extension   black tband flex and ext 15 each    Cybex Knee Extension  5# 2 sets 10    Cybex Knee Flexion  15# 2 sets 10    Leg Press  20# 3x10    Other Lumbar Machine Exercise  rows and lats 20# 2 sets 10      Modalities   Modalities  Moist Heat      Moist Heat Therapy   Number Minutes Moist Heat  10 Minutes    Moist Heat Location  Lumbar Spine                  PT Long Term Goals - 08/01/19 1147      PT LONG TERM GOAL #1   Title  patient to be independent with balance and strengthening HEP ( 08/05/2019)    Status  Partially  Met      PT LONG TERM GOAL #2   Title  improve TUG =/< 12 sec to decrease risk of falls. ( 08/05/2019)    Status  Partially Met      PT LONG TERM GOAL #3   Title  improve Rt hip strength =/> 4+/5 to allow safe ambulation with her walking stick  ( 08/05/2019)    Status  Partially Met      PT LONG TERM GOAL #4   Title  improve DGI =/> 20/24 to decrease risk of falls ( 08/05/2019)    Status  Partially Met      PT LONG TERM GOAL #5   Title  report =/> 50% reduction of Rt buttock pain in the morning ( 08/05/2019)    Status  Partially Met            Plan - 08/06/19 1223    Clinical Impression Statement  no issues with ex but cuing for tech. CG-min A with obstacle course and pt very fearful and hesitant with stepping over and on objects- educ on safety in environment    PT Treatment/Interventions  Iontophoresis 74m/ml Dexamethasone;Taping;Patient/family education;Functional mobility training;Moist Heat;Passive range of motion;Ultrasound;Cryotherapy;Therapeutic exercise;Balance training;Neuromuscular re-education;Manual techniques;Dry needling    PT Next Visit Plan  work on strength of the legs, hips and core as well as balance       Patient will  benefit from skilled therapeutic intervention in order to improve the following deficits and impairments:  Abnormal gait, Increased muscle spasms, Pain, Decreased balance, Decreased strength  Visit Diagnosis: Difficulty in walking, not elsewhere classified  Unsteadiness on feet  Chronic bilateral low back pain without sciatica     Problem List Patient Active Problem List   Diagnosis Date Noted  . Balance disorder 04/05/2019  . Constipation by delayed colonic transit 12/13/2017  . Hemorrhoids 02/14/2017  . Atrial fibrillation ([place] 08/10/2015  . Orthostatic hypotension 08/04/2015  . Leukocytosis 07/27/2015  . Genetic testing 07/27/2015  . Family history of cancer   . Cancer of central portion of left female breast ([place] 06/01/2015  . Bilateral low back pain without sciatica 11/18/2014  . Right shoulder pain 04/10/2014  . Muscle spasm of back 01/13/2014  . Olecranon bursitis of left elbow 08/26/2013  . Posterolateral cervical muscle strain 12/14/2012  . Hyperlipidemia 02/16/2010  . TMJ PAIN 02/16/2010  . SEBORRHEIC KERATOSIS 08/24/2009  . Essential hypertension 10/18/2007  . OSTEOPENIA 07/12/2007  . Melanoma of skin ([place] 10/17/2006  . BREAST CYST 10/17/2006  . COLONOSCOPY, HX OF 10/17/2006    Jessica Mullins,Jessica Mullins PTA 08/06/2019, 12:25 PM  [place] [address]Phone: [phone]  Fax:  [phone] Name: Jessica Mullins MRN: [id number] of Birth: 608-25-1932

## 2019-08-08 ENCOUNTER — Ambulatory Visit: Payer: Medicare PPO | Attending: Internal Medicine

## 2019-08-08 DIAGNOSIS — Z23 Encounter for immunization: Secondary | ICD-10-CM | POA: Insufficient documentation

## 2019-08-08 NOTE — Progress Notes (Signed)
   Covid-19 Vaccination Clinic  Name:  Jessica Mullins    MRN: [id number] DOB: 1930-11-03  08/08/2019  [name] was observed post Covid-19 immunization for 15 minutes without incidence. She was provided with Vaccine Information Sheet and instruction to access the V-Safe system.   [name] was instructed to call 911 with any severe reactions post vaccine: [name] Difficulty breathing  . Swelling of your face and throat  . A fast heartbeat  . A bad rash all over your body  . Dizziness and weakness    Immunizations Administered    Name Date Dose VIS Date Route   Pfizer COVID-19 Vaccine 08/08/2019 11:52 AM 0.3 mL 06/07/2019 Intramuscular   Manufacturer: [place]   Lot: [id number]   [place]: [id number]

## 2019-08-13 ENCOUNTER — Ambulatory Visit: Payer: Medicare PPO | Admitting: Physical Therapy

## 2019-08-15 ENCOUNTER — Ambulatory Visit: Payer: Medicare PPO | Admitting: Physical Therapy

## 2019-08-19 ENCOUNTER — Other Ambulatory Visit: Payer: Self-pay | Admitting: Hematology and Oncology

## 2019-08-19 ENCOUNTER — Encounter: Payer: Self-pay | Admitting: Physical Therapy

## 2019-08-19 ENCOUNTER — Other Ambulatory Visit: Payer: Self-pay

## 2019-08-19 ENCOUNTER — Ambulatory Visit: Payer: Medicare PPO | Admitting: Physical Therapy

## 2019-08-19 DIAGNOSIS — G8929 Other chronic pain: Secondary | ICD-10-CM | POA: Diagnosis not present

## 2019-08-19 DIAGNOSIS — R262 Difficulty in walking, not elsewhere classified: Secondary | ICD-10-CM | POA: Diagnosis not present

## 2019-08-19 DIAGNOSIS — R2681 Unsteadiness on feet: Secondary | ICD-10-CM

## 2019-08-19 DIAGNOSIS — M62838 Other muscle spasm: Secondary | ICD-10-CM | POA: Diagnosis not present

## 2019-08-19 DIAGNOSIS — I4819 Other persistent atrial fibrillation: Secondary | ICD-10-CM

## 2019-08-19 DIAGNOSIS — M545 Low back pain, unspecified: Secondary | ICD-10-CM

## 2019-08-19 NOTE — Therapy (Signed)
[facility] [place], [place], [address] Phone: [phone]   Fax:  [phone]  Physical Therapy Treatment  Patient Details  Name: Jessica Mullins MRN: [id number] Date of Birth: 11-11-1930 Referring Provider (PT): Dr Lynne Leader   Encounter Date: 08/19/2019  PT End of Session - 08/19/19 1000    Visit Number  9    Date for PT Re-Evaluation  09/03/19    Authorization Type  [employer] MCR 10th visit pnote    PT Stop Time  1011    Activity Tolerance  Patient tolerated treatment well    Behavior During Therapy  [facility] for tasks assessed/performed       Past Medical History:  Diagnosis Date  . Allergy   . Ankle fracture, left   . Breast cancer ([place]) 1115/16   left   . Bursitis of right shoulder   . Cataract   . Family history of cancer   . Fracture of right wrist   . GERD (gastroesophageal reflux disease)   . Hyperlipidemia   . Hypertension   . Skin cancer 2000   melanoma and basal cell  . Vaso vagal episode    during preparation for colonoscopy    Past Surgical History:  Procedure Laterality Date  . ABDOMINAL HYSTERECTOMY  1988   TAH/BSO--FIBROIDS  . APPENDECTOMY    . BREAST LUMPECTOMY     B/L--FCS  . BREAST LUMPECTOMY WITH RADIOACTIVE SEED AND SENTINEL LYMPH NODE BIOPSY Left 06/11/2015   Procedure: LEFT BREAST LUMPECTOMY WITH RADIOACTIVE SEED AND LEFT SENTINEL LYMPH NODE MAPPING;  Surgeon: Erroll Luna, MD;  Location: [place];  Service: General;  Laterality: Left;  . CATARACT EXTRACTION  2015  . COLONOSCOPY  2010  . DG  BONE DENSITY ([place] HX)    . EYE SURGERY Bilateral    cataracts  . fiberadenoma Bilateral 1978, 1980  . GANGLION CYST EXCISION     L  hand  . Lipiflow procedure      There were no vitals filed for this visit.  Subjective Assessment - 08/19/19 0932    Subjective  Was doing good, reports some pain that started last night in the R hip.Does not know why, better this morning    Currently in Pain?  Yes    Pain Score  3     Pain Location  Hip    Pain Orientation  Right                       [facility] Adult PT Treatment/Exercise - 08/19/19 0001      Lumbar Exercises: Aerobic   Nustep  L5x6'      Lumbar Exercises: Machines for Strengthening   Cybex Knee Extension  5# 2 sets 10    Cybex Knee Flexion  15# 2 sets 15    Leg Press  20# x10      Lumbar Exercises: Standing   Row  Strengthening;Both;Theraband;20 reps    Theraband Level (Row)  Level 3 (Green)    Shoulder Extension  Strengthening;Theraband;20 reps    Theraband Level (Shoulder Extension)  Level 3 (Green)    Other Standing Lumbar Exercises  LAteral 4 in step up x10 each       Modalities   Modalities  Moist Heat      Moist Heat Therapy   Number Minutes Moist Heat  12 Minutes    Moist Heat Location  Lumbar Spine  PT Long Term Goals - 08/01/19 1147      PT LONG TERM GOAL #1   Title  patient to be independent with balance and strengthening HEP ( 08/05/2019)    Status  Partially Met      PT LONG TERM GOAL #2   Title  improve TUG =/< 12 sec to decrease risk of falls. ( 08/05/2019)    Status  Partially Met      PT LONG TERM GOAL #3   Title  improve Rt hip strength =/> 4+/5 to allow safe ambulation with her walking stick  ( 08/05/2019)    Status  Partially Met      PT LONG TERM GOAL #4   Title  improve DGI =/> 20/24 to decrease risk of falls ( 08/05/2019)    Status  Partially Met      PT LONG TERM GOAL #5   Title  report =/> 50% reduction of Rt buttock pain in the morning ( 08/05/2019)    Status  Partially Met            Plan - 08/19/19 1001    Clinical Impression Statement  Pt enters clinic reporting increase R hip/low back pain that started last night. Omitted some treatment interventions today. Her symptoms did improve with the exercises. CGA needed for lateral step ups.    Personal Factors and Comorbidities  Age;Comorbidity 3+    Examination-Activity  Limitations  Bed Mobility;Stand;Locomotion Level    Examination-Participation Restrictions  Community Activity;Other    Stability/Clinical Decision Making  Stable/Uncomplicated    Rehab Potential  Excellent    PT Frequency  2x / week    PT Treatment/Interventions  Iontophoresis 37m/ml Dexamethasone;Taping;Patient/family education;Functional mobility training;Moist Heat;Passive range of motion;Ultrasound;Cryotherapy;Therapeutic exercise;Balance training;Neuromuscular re-education;Manual techniques;Dry needling       Patient will benefit from skilled therapeutic intervention in order to improve the following deficits and impairments:  Abnormal gait, Increased muscle spasms, Pain, Decreased balance, Decreased strength  Visit Diagnosis: Difficulty in walking, not elsewhere classified  Unsteadiness on feet  Chronic bilateral low back pain without sciatica     Problem List Patient Active Problem List   Diagnosis Date Noted  . Balance disorder 04/05/2019  . Constipation by delayed colonic transit 12/13/2017  . Hemorrhoids 02/14/2017  . Atrial fibrillation ([place] 08/10/2015  . Orthostatic hypotension 08/04/2015  . Leukocytosis 07/27/2015  . Genetic testing 07/27/2015  . Family history of cancer   . Cancer of central portion of left female breast ([place] 06/01/2015  . Bilateral low back pain without sciatica 11/18/2014  . Right shoulder pain 04/10/2014  . Muscle spasm of back 01/13/2014  . Olecranon bursitis of left elbow 08/26/2013  . Posterolateral cervical muscle strain 12/14/2012  . Hyperlipidemia 02/16/2010  . TMJ PAIN 02/16/2010  . SEBORRHEIC KERATOSIS 08/24/2009  . Essential hypertension 10/18/2007  . OSTEOPENIA 07/12/2007  . Melanoma of skin ([place] 10/17/2006  . BREAST CYST 10/17/2006  . COLONOSCOPY, HX OF 10/17/2006    RScot Jun PTA 08/19/2019, 10:07 AM  [place]  [address]Phone: [phone]  Fax:  [phone] Name: Jessica Mullins MRN: [id number] of Birth: [birth year]

## 2019-08-27 ENCOUNTER — Other Ambulatory Visit: Payer: Self-pay

## 2019-08-27 ENCOUNTER — Ambulatory Visit: Payer: Medicare PPO | Attending: Family Medicine | Admitting: Physical Therapy

## 2019-08-27 ENCOUNTER — Encounter: Payer: Self-pay | Admitting: Physical Therapy

## 2019-08-27 DIAGNOSIS — R2681 Unsteadiness on feet: Secondary | ICD-10-CM | POA: Diagnosis not present

## 2019-08-27 DIAGNOSIS — M545 Low back pain, unspecified: Secondary | ICD-10-CM

## 2019-08-27 DIAGNOSIS — G8929 Other chronic pain: Secondary | ICD-10-CM | POA: Insufficient documentation

## 2019-08-27 DIAGNOSIS — R262 Difficulty in walking, not elsewhere classified: Secondary | ICD-10-CM | POA: Insufficient documentation

## 2019-08-27 DIAGNOSIS — M62838 Other muscle spasm: Secondary | ICD-10-CM | POA: Insufficient documentation

## 2019-08-27 NOTE — Therapy (Signed)
[facility] [place], [place], [address] Phone: [phone]   Fax:  [phone] Progress Note Reporting Period 07/08/19 to 08/27/19 for the first 10 visits See note below for Objective Data and Assessment of Progress/Goals.      Physical Therapy Treatment  Patient Details  Name: Jessica Mullins MRN: [id number] Date of Birth: [birth year] Referring Provider (PT): Dr Lynne Leader   Encounter Date: 08/27/2019  PT End of Session - 08/27/19 1421    Visit Number  10    Date for PT Re-Evaluation  09/03/19    Authorization Type  [employer] MCR 10th visit pnote    PT Start Time  4287    PT Stop Time  1435    PT Time Calculation (min)  50 min    Activity Tolerance  Patient tolerated treatment well    Behavior During Therapy  [facility] for tasks assessed/performed       Past Medical History:  Diagnosis Date  . Allergy   . Ankle fracture, left   . Breast cancer ([place]) 1115/16   left   . Bursitis of right shoulder   . Cataract   . Family history of cancer   . Fracture of right wrist   . GERD (gastroesophageal reflux disease)   . Hyperlipidemia   . Hypertension   . Skin cancer 2000   melanoma and basal cell  . Vaso vagal episode    during preparation for colonoscopy    Past Surgical History:  Procedure Laterality Date  . ABDOMINAL HYSTERECTOMY  1988   TAH/BSO--FIBROIDS  . APPENDECTOMY    . BREAST LUMPECTOMY     B/L--FCS  . BREAST LUMPECTOMY WITH RADIOACTIVE SEED AND SENTINEL LYMPH NODE BIOPSY Left 06/11/2015   Procedure: LEFT BREAST LUMPECTOMY WITH RADIOACTIVE SEED AND LEFT SENTINEL LYMPH NODE MAPPING;  Surgeon: Erroll Luna, MD;  Location: [facility];  Service: General;  Laterality: Left;  . CATARACT EXTRACTION  2015  . COLONOSCOPY  2010  . DG  BONE DENSITY ([place] HX)    . EYE SURGERY Bilateral    cataracts  . fiberadenoma Bilateral 1978, 1980  . GANGLION CYST EXCISION     L  hand  . Lipiflow procedure       There were no vitals filed for this visit.  Subjective Assessment - 08/27/19 1349    Subjective  "Except for my back things are great" Still getting her house ready to sale.    Currently in Pain?  No/denies                       [facility] Adult PT Treatment/Exercise - 08/27/19 0001      Lumbar Exercises: Aerobic   Nustep  L5x6'      Lumbar Exercises: Machines for Strengthening   Cybex Lumbar Extension   black tband flex and ext 10 each    Cybex Knee Extension  5# 2 sets 10    Cybex Knee Flexion  15# 2 sets 10    Leg Press  30# 3x10    Other Lumbar Machine Exercise  rows and lats 20# 2 sets 10      Lumbar Exercises: Standing   Row  Strengthening;Both;Theraband;20 reps    Theraband Level (Row)  Level 3 (Green)    Shoulder Extension  Strengthening;Theraband;20 reps    Theraband Level (Shoulder Extension)  Level 3 (Green)    Other Standing Lumbar Exercises  Lateral 4 in step up x10  each       Modalities   Modalities  Moist Heat      Moist Heat Therapy   Number Minutes Moist Heat  12 Minutes    Moist Heat Location  Lumbar Spine                  PT Long Term Goals - 08/01/19 1147      PT LONG TERM GOAL #1   Title  patient to be independent with balance and strengthening HEP ( 08/05/2019)    Status  Partially Met      PT LONG TERM GOAL #2   Title  improve TUG =/< 12 sec to decrease risk of falls. ( 08/05/2019)    Status  Partially Met      PT LONG TERM GOAL #3   Title  improve Rt hip strength =/> 4+/5 to allow safe ambulation with her walking stick  ( 08/05/2019)    Status  Partially Met      PT LONG TERM GOAL #4   Title  improve DGI =/> 20/24 to decrease risk of falls ( 08/05/2019)    Status  Partially Met      PT LONG TERM GOAL #5   Title  report =/> 50% reduction of Rt buttock pain in the morning ( 08/05/2019)    Status  Partially Met            Plan - 08/27/19 1422    Clinical Impression Statement  Pt enters clinic reporting no pain  just some achy ness  from lifting in preparation to selling her house. She did well with the activities increasing her resistance on leg press.Postural cues needed with Tband rows and extensions. Cues to keep core engaged with lat pull downs.    Personal Factors and Comorbidities  Age;Comorbidity 3+    Examination-Activity Limitations  Bed Mobility;Stand;Locomotion Level    Stability/Clinical Decision Making  Stable/Uncomplicated    Rehab Potential  Excellent    PT Frequency  2x / week    PT Duration  4 weeks    PT Treatment/Interventions  Iontophoresis 31m/ml Dexamethasone;Taping;Patient/family education;Functional mobility training;Moist Heat;Passive range of motion;Ultrasound;Cryotherapy;Therapeutic exercise;Balance training;Neuromuscular re-education;Manual techniques;Dry needling    PT Next Visit Plan  work on strength of the legs, hips and core as well as balance, possible D/C       Patient will benefit from skilled therapeutic intervention in order to improve the following deficits and impairments:  Abnormal gait, Increased muscle spasms, Pain, Decreased balance, Decreased strength  Visit Diagnosis: Unsteadiness on feet  Difficulty in walking, not elsewhere classified  Chronic bilateral low back pain without sciatica     Problem List Patient Active Problem List   Diagnosis Date Noted  . Balance disorder 04/05/2019  . Constipation by delayed colonic transit 12/13/2017  . Hemorrhoids 02/14/2017  . Atrial fibrillation ([place] 08/10/2015  . Orthostatic hypotension 08/04/2015  . Leukocytosis 07/27/2015  . Genetic testing 07/27/2015  . Family history of cancer   . Cancer of central portion of left female breast ([place] 06/01/2015  . Bilateral low back pain without sciatica 11/18/2014  . Right shoulder pain 04/10/2014  . Muscle spasm of back 01/13/2014  . Olecranon bursitis of left elbow 08/26/2013  . Posterolateral cervical muscle strain 12/14/2012  . Hyperlipidemia 02/16/2010   . TMJ PAIN 02/16/2010  . SEBORRHEIC KERATOSIS 08/24/2009  . Essential hypertension 10/18/2007  . OSTEOPENIA 07/12/2007  . Melanoma of skin ([place] 10/17/2006  . BREAST CYST 10/17/2006  . COLONOSCOPY, HX  OF 10/17/2006    Scot Jun, PTA 08/27/2019, 2:25 PM  [place], [place], [address] Phone: [phone]   Fax:  [phone]  Name: Kayley L Villamizar MRN: [id number] Date of Birth: Apr 03, 1931

## 2019-08-30 ENCOUNTER — Ambulatory Visit: Payer: Medicare PPO | Admitting: Physical Therapy

## 2019-08-30 ENCOUNTER — Encounter: Payer: Self-pay | Admitting: Physical Therapy

## 2019-08-30 ENCOUNTER — Other Ambulatory Visit: Payer: Self-pay

## 2019-08-30 DIAGNOSIS — R2681 Unsteadiness on feet: Secondary | ICD-10-CM

## 2019-08-30 DIAGNOSIS — M62838 Other muscle spasm: Secondary | ICD-10-CM | POA: Diagnosis not present

## 2019-08-30 DIAGNOSIS — M545 Low back pain, unspecified: Secondary | ICD-10-CM

## 2019-08-30 DIAGNOSIS — G8929 Other chronic pain: Secondary | ICD-10-CM

## 2019-08-30 DIAGNOSIS — R262 Difficulty in walking, not elsewhere classified: Secondary | ICD-10-CM | POA: Diagnosis not present

## 2019-08-30 NOTE — Therapy (Signed)
[place], [place], [address] Phone: [phone]   Fax:  [phone]  Physical Therapy Treatment  Patient Details  Name: Jessica Mullins MRN: [id number] Date of Birth: [birth year] Referring Provider (PT): Dr Lynne Leader   Encounter Date: 08/30/2019  PT End of Session - 08/30/19 1053    Visit Number  11    Date for PT Re-Evaluation  09/03/19    Authorization Type  [employer] M[employer] 10th visit pnote    Activity Tolerance  Patient tolerated treatment well    Behavior During Therapy  [facility] for tasks assessed/performed       Past Medical History:  Diagnosis Date  . Allergy   . Ankle fracture, left   . Breast cancer ([place]) 1115/16   left   . Bursitis of right shoulder   . Cataract   . Family history of cancer   . Fracture of right wrist   . GERD (gastroesophageal reflux disease)   . Hyperlipidemia   . Hypertension   . Skin cancer 2000   melanoma and basal cell  . Vaso vagal episode    during preparation for colonoscopy    Past Surgical History:  Procedure Laterality Date  . ABDOMINAL HYSTERECTOMY  1988   TAH/BSO--FIBROIDS  . APPENDECTOMY    . BREAST LUMPECTOMY     B/L--FCS  . BREAST LUMPECTOMY WITH RADIOACTIVE SEED AND SENTINEL LYMPH NODE BIOPSY Left 06/11/2015   Procedure: LEFT BREAST LUMPECTOMY WITH RADIOACTIVE SEED AND LEFT SENTINEL LYMPH NODE MAPPING;  Surgeon: Erroll Luna, MD;  Location: [facility];  Service: General;  Laterality: Left;  . CATARACT EXTRACTION  2015  . COLONOSCOPY  2010  . DG  BONE DENSITY ([place] HX)    . EYE SURGERY Bilateral    cataracts  . fiberadenoma Bilateral 1978, 1980  . GANGLION CYST EXCISION     L  hand  . Lipiflow procedure      There were no vitals filed for this visit.  Subjective Assessment - 08/30/19 1015    Subjective  "I am feeling pretty good considering"    Currently in Pain?  Yes    Pain Score  1     Pain Location  Hip                        [facility] Adult PT Treatment/Exercise - 08/30/19 0001      Lumbar Exercises: Aerobic   UBE (Upper Arm Bike)  L2 x 2 each way     Nustep  L5x6'      Lumbar Exercises: Machines for Strengthening   Cybex Knee Extension  5# 2 sets 15    Cybex Knee Flexion  15# 2 sets 15    Leg Press  40lb 2x10     Other Lumbar Machine Exercise  rows and lats 20# 2 sets 10      Lumbar Exercises: Standing   Other Standing Lumbar Exercises  forward step ups 6 in x10 each     Other Standing Lumbar Exercises  Lateral 6 in step up x10 each       Modalities   Modalities  Moist Heat      Moist Heat Therapy   Number Minutes Moist Heat  12 Minutes    Moist Heat Location  Lumbar Spine                  PT Long Term Goals - 08/30/19 1055  PT LONG TERM GOAL #1   Title  patient to be independent with balance and strengthening HEP ( 08/05/2019)    Status  Partially Met      PT LONG TERM GOAL #2   Title  improve TUG =/< 12 sec to decrease risk of falls. ( 08/05/2019)    Status  Partially Met      PT LONG TERM GOAL #4   Title  improve DGI =/> 20/24 to decrease risk of falls ( 08/05/2019)    Status  Partially Met      PT LONG TERM GOAL #5   Title  report =/> 50% reduction of Rt buttock pain in the morning ( 08/05/2019)    Status  Partially Met            Plan - 08/30/19 1057    Clinical Impression Statement  Pt is doing well overall. Some tolerable hip pain reported. She reports that her balance has been off today. Some instability with the forward step ups LOB x1 min assist to correct. Postural cues needed with seated rows and standing shoulder extensions.    Personal Factors and Comorbidities  Age;Comorbidity 3+    Examination-Activity Limitations  Bed Mobility;Stand;Locomotion Level    Examination-Participation Restrictions  Community Activity;Other    Stability/Clinical Decision Making  Stable/Uncomplicated    Rehab Potential  Excellent    PT Frequency  2x  / week    PT Duration  4 weeks    PT Next Visit Plan  work on strength of the legs, hips and core as well as balance, possible D/C       Patient will benefit from skilled therapeutic intervention in order to improve the following deficits and impairments:  Abnormal gait, Increased muscle spasms, Pain, Decreased balance, Decreased strength  Visit Diagnosis: Unsteadiness on feet  Difficulty in walking, not elsewhere classified  Chronic bilateral low back pain without sciatica  Other muscle spasm     Problem List Patient Active Problem List   Diagnosis Date Noted  . Balance disorder 04/05/2019  . Constipation by delayed colonic transit 12/13/2017  . Hemorrhoids 02/14/2017  . Atrial fibrillation ([place]) 08/10/2015  . Orthostatic hypotension 08/04/2015  . Leukocytosis 07/27/2015  . Genetic testing 07/27/2015  . Family history of cancer   . Cancer of central portion of left female breast ([place]) 06/01/2015  . Bilateral low back pain without sciatica 11/18/2014  . Right shoulder pain 04/10/2014  . Muscle spasm of back 01/13/2014  . Olecranon bursitis of left elbow 08/26/2013  . Posterolateral cervical muscle strain 12/14/2012  . Hyperlipidemia 02/16/2010  . TMJ PAIN 02/16/2010  . SEBORRHEIC KERATOSIS 08/24/2009  . Essential hypertension 10/18/2007  . OSTEOPENIA 07/12/2007  . Melanoma of skin ([place]) 10/17/2006  . BREAST CYST 10/17/2006  . COLONOSCOPY, HX OF 10/17/2006    Scot Jun, PTA 08/30/2019, 11:00 AM  [place], [place], [address] Phone: [phone]   Fax:  [phone]  Name: Jessica Mullins MRN: [id number] Date of Birth: Oct 13, 1930

## 2019-09-02 ENCOUNTER — Other Ambulatory Visit: Payer: Self-pay

## 2019-09-02 ENCOUNTER — Encounter: Payer: Self-pay | Admitting: Physical Therapy

## 2019-09-02 ENCOUNTER — Ambulatory Visit: Payer: Medicare PPO | Admitting: Physical Therapy

## 2019-09-02 DIAGNOSIS — R2681 Unsteadiness on feet: Secondary | ICD-10-CM | POA: Diagnosis not present

## 2019-09-02 DIAGNOSIS — M62838 Other muscle spasm: Secondary | ICD-10-CM | POA: Diagnosis not present

## 2019-09-02 DIAGNOSIS — M545 Low back pain, unspecified: Secondary | ICD-10-CM

## 2019-09-02 DIAGNOSIS — R262 Difficulty in walking, not elsewhere classified: Secondary | ICD-10-CM

## 2019-09-02 DIAGNOSIS — G8929 Other chronic pain: Secondary | ICD-10-CM

## 2019-09-02 NOTE — Therapy (Signed)
[place], [place], [address] Phone: [phone]   Fax:  [phone]  Physical Therapy Treatment  Patient Details  Name: Jessica Mullins MRN: [id number] Date of Birth: 1930-09-26 Referring Provider (PT): Dr Lynne Leader   Encounter Date: 09/02/2019  PT End of Session - 09/02/19 1153    Visit Number  12    Date for PT Re-Evaluation  10/04/19    Authorization Type  [employer] M[employer] 10th visit pnote    PT Start Time  0925    PT Stop Time  1013    PT Time Calculation (min)  48 min    Activity Tolerance  Patient tolerated treatment well    Behavior During Therapy  [facility] for tasks assessed/performed       Past Medical History:  Diagnosis Date  . Allergy   . Ankle fracture, left   . Breast cancer ([place]) 1115/16   left   . Bursitis of right shoulder   . Cataract   . Family history of cancer   . Fracture of right wrist   . GERD (gastroesophageal reflux disease)   . Hyperlipidemia   . Hypertension   . Skin cancer 2000   melanoma and basal cell  . Vaso vagal episode    during preparation for colonoscopy    Past Surgical History:  Procedure Laterality Date  . ABDOMINAL HYSTERECTOMY  1988   TAH/BSO--FIBROIDS  . APPENDECTOMY    . BREAST LUMPECTOMY     B/L--FCS  . BREAST LUMPECTOMY WITH RADIOACTIVE SEED AND SENTINEL LYMPH NODE BIOPSY Left 06/11/2015   Procedure: LEFT BREAST LUMPECTOMY WITH RADIOACTIVE SEED AND LEFT SENTINEL LYMPH NODE MAPPING;  Surgeon: Erroll Luna, MD;  Location: [facility];  Service: General;  Laterality: Left;  . CATARACT EXTRACTION  2015  . COLONOSCOPY  2010  . DG  BONE DENSITY ([place] HX)    . EYE SURGERY Bilateral    cataracts  . fiberadenoma Bilateral 1978, 1980  . GANGLION CYST EXCISION     L  hand  . Lipiflow procedure      There were no vitals filed for this visit.  Subjective Assessment - 09/02/19 1150    Subjective  Patient reports that she continues to stumble at  times, reports that changing directions or stepping over things is the biggest issue she has with balance and unsteadiness         [facility] PT Assessment - 09/02/19 0001      Timed Up and Go Test   Normal TUG (seconds)  14                   [facility] Adult PT Treatment/Exercise - 09/02/19 0001      High Level Balance   High Level Balance Activities  Negotitating around obstacles;Negotiating over obstacles;Side stepping;Backward walking;Direction changes    High Level Balance Comments  ball kicks, ball toss on airex, ball toss walking, ball toss changing directions, on airex reaching, on airex 8" toe touches, side step over objects                  PT Long Term Goals - 09/02/19 1157      PT LONG TERM GOAL #1   Title  patient to be independent with balance and strengthening HEP ( 08/05/2019)    Status  Partially Met      PT LONG TERM GOAL #2   Title  improve TUG =/< 12 sec to decrease  risk of falls. ( 08/05/2019)    Status  Partially Met      PT LONG TERM GOAL #3   Title  improve Rt hip strength =/> 4+/5 to allow safe ambulation with her walking stick  ( 08/05/2019)    Status  Partially Met      PT LONG TERM GOAL #4   Title  improve DGI =/> 20/24 to decrease risk of falls ( 08/05/2019)    Status  Partially Met      PT LONG TERM GOAL #5   Title  report =/> 50% reduction of Rt buttock pain in the morning ( 08/05/2019)    Status  Partially Met            Plan - 09/02/19 1153    Clinical Impression Statement  Patient is doing well, she does mentiion that she feels stronger and is walking better, she does report still having issues with balance during directions changes or with stepping over things.  She reports that she does not feel stronger and having less issues getting up from sitting but ust feels unsteady walking.  She will be moving in the next few months and would like to continue to work on balance and strength so she can do this without issus    PT Next  Visit Plan  Will continue to work on her balance to help with her move    Consulted and Agree with Plan of Care  Patient       Patient will benefit from skilled therapeutic intervention in order to improve the following deficits and impairments:  Abnormal gait, Increased muscle spasms, Pain, Decreased balance, Decreased strength  Visit Diagnosis: Unsteadiness on feet  Difficulty in walking, not elsewhere classified  Chronic bilateral low back pain without sciatica     Problem List Patient Active Problem List   Diagnosis Date Noted  . Balance disorder 04/05/2019  . Constipation by delayed colonic transit 12/13/2017  . Hemorrhoids 02/14/2017  . Atrial fibrillation ([place]) 08/10/2015  . Orthostatic hypotension 08/04/2015  . Leukocytosis 07/27/2015  . Genetic testing 07/27/2015  . Family history of cancer   . Cancer of central portion of left female breast ([place]) 06/01/2015  . Bilateral low back pain without sciatica 11/18/2014  . Right shoulder pain 04/10/2014  . Muscle spasm of back 01/13/2014  . Olecranon bursitis of left elbow 08/26/2013  . Posterolateral cervical muscle strain 12/14/2012  . Hyperlipidemia 02/16/2010  . TMJ PAIN 02/16/2010  . SEBORRHEIC KERATOSIS 08/24/2009  . Essential hypertension 10/18/2007  . OSTEOPENIA 07/12/2007  . Melanoma of skin ([place]) 10/17/2006  . BREAST CYST 10/17/2006  . COLONOSCOPY, HX OF 10/17/2006    Sumner Boast., PT 09/02/2019, 11:58 AM  [facility] [address] [place] [address] [place], [place], [address] Phone: [phone]   Fax:  [phone]  Name: Jessica Mullins MRN: [id number] Date of Birth: 06-14-1931

## 2019-09-17 DIAGNOSIS — H353131 Nonexudative age-related macular degeneration, bilateral, early dry stage: Secondary | ICD-10-CM | POA: Diagnosis not present

## 2019-09-17 DIAGNOSIS — H04123 Dry eye syndrome of bilateral lacrimal glands: Secondary | ICD-10-CM | POA: Diagnosis not present

## 2019-09-20 ENCOUNTER — Other Ambulatory Visit: Payer: Self-pay | Admitting: Hematology and Oncology

## 2019-09-20 DIAGNOSIS — I4819 Other persistent atrial fibrillation: Secondary | ICD-10-CM

## 2019-09-30 DIAGNOSIS — H05231 Hemorrhage of right orbit: Secondary | ICD-10-CM | POA: Diagnosis not present

## 2019-09-30 DIAGNOSIS — Z7901 Long term (current) use of anticoagulants: Secondary | ICD-10-CM | POA: Diagnosis not present

## 2019-10-03 ENCOUNTER — Other Ambulatory Visit: Payer: Self-pay | Admitting: Family Medicine

## 2019-10-03 DIAGNOSIS — R21 Rash and other nonspecific skin eruption: Secondary | ICD-10-CM

## 2019-10-05 ENCOUNTER — Emergency Department (HOSPITAL_COMMUNITY): Payer: Medicare PPO

## 2019-10-05 ENCOUNTER — Emergency Department (HOSPITAL_COMMUNITY)
Admission: EM | Admit: 2019-10-05 | Discharge: 2019-10-05 | Disposition: A | Discharge status: Home / Self Care | Payer: Medicare PPO | Attending: Emergency Medicine | Admitting: Emergency Medicine

## 2019-10-05 DIAGNOSIS — W19XXXA Unspecified fall, initial encounter: Secondary | ICD-10-CM | POA: Diagnosis not present

## 2019-10-05 DIAGNOSIS — R1111 Vomiting without nausea: Secondary | ICD-10-CM | POA: Diagnosis not present

## 2019-10-05 DIAGNOSIS — Z79899 Other long term (current) drug therapy: Secondary | ICD-10-CM | POA: Insufficient documentation

## 2019-10-05 DIAGNOSIS — Z85828 Personal history of other malignant neoplasm of skin: Secondary | ICD-10-CM | POA: Diagnosis not present

## 2019-10-05 DIAGNOSIS — Z853 Personal history of malignant neoplasm of breast: Secondary | ICD-10-CM | POA: Insufficient documentation

## 2019-10-05 DIAGNOSIS — R42 Dizziness and giddiness: Secondary | ICD-10-CM | POA: Diagnosis not present

## 2019-10-05 DIAGNOSIS — M542 Cervicalgia: Secondary | ICD-10-CM | POA: Insufficient documentation

## 2019-10-05 DIAGNOSIS — Z7901 Long term (current) use of anticoagulants: Secondary | ICD-10-CM | POA: Diagnosis not present

## 2019-10-05 DIAGNOSIS — S0990XA Unspecified injury of head, initial encounter: Secondary | ICD-10-CM | POA: Diagnosis not present

## 2019-10-05 DIAGNOSIS — R0902 Hypoxemia: Secondary | ICD-10-CM | POA: Diagnosis not present

## 2019-10-05 DIAGNOSIS — I959 Hypotension, unspecified: Secondary | ICD-10-CM | POA: Diagnosis not present

## 2019-10-05 DIAGNOSIS — D329 Benign neoplasm of meninges, unspecified: Secondary | ICD-10-CM | POA: Diagnosis not present

## 2019-10-05 DIAGNOSIS — I10 Essential (primary) hypertension: Secondary | ICD-10-CM | POA: Diagnosis not present

## 2019-10-05 DIAGNOSIS — S299XXA Unspecified injury of thorax, initial encounter: Secondary | ICD-10-CM | POA: Diagnosis not present

## 2019-10-05 DIAGNOSIS — R519 Headache, unspecified: Secondary | ICD-10-CM | POA: Diagnosis not present

## 2019-10-05 DIAGNOSIS — S199XXA Unspecified injury of neck, initial encounter: Secondary | ICD-10-CM | POA: Diagnosis not present

## 2019-10-05 DIAGNOSIS — E860 Dehydration: Secondary | ICD-10-CM | POA: Diagnosis not present

## 2019-10-05 LAB — COMPREHENSIVE METABOLIC PANEL
ALT: 20 U/L (ref 0–44)
AST: 23 U/L (ref 15–41)
Albumin: 3.2 g/dL — ABNORMAL LOW (ref 3.5–5.0)
Alkaline Phosphatase: 49 U/L (ref 38–126)
Anion gap: 10 (ref 5–15)
BUN: 22 mg/dL (ref 8–23)
CO2: 24 mmol/L (ref 22–32)
Calcium: 8.6 mg/dL — ABNORMAL LOW (ref 8.9–10.3)
Chloride: 107 mmol/L (ref 98–111)
Creatinine, Ser: 0.85 mg/dL (ref 0.44–1.00)
GFR calc Af Amer: >60 mL/min (ref >60)
GFR calc non Af Amer: >60 mL/min (ref >60)
Glucose, Bld: 132 mg/dL — ABNORMAL HIGH (ref 70–99)
Potassium: 3.9 mmol/L (ref 3.5–5.1)
Sodium: 141 mmol/L (ref 135–145)
Total Bilirubin: 0.9 mg/dL (ref 0.3–1.2)
Total Protein: 5.6 g/dL — ABNORMAL LOW (ref 6.5–8.1)

## 2019-10-05 LAB — CBC WITH DIFFERENTIAL/PLATELET
Abs Immature Granulocytes: 0.05 10*3/uL (ref 0.00–0.07)
Basophils Absolute: 0 10*3/uL (ref 0.0–0.1)
Basophils Relative: 0 %
Eosinophils Absolute: 0 10*3/uL (ref 0.0–0.5)
Eosinophils Relative: 0 %
HCT: 45.8 % (ref 36.0–46.0)
Hemoglobin: 15.2 g/dL — ABNORMAL HIGH (ref 12.0–15.0)
Immature Granulocytes: 0 %
Lymphocytes Relative: 3 %
Lymphs Abs: 0.3 10*3/uL — ABNORMAL LOW (ref 0.7–4.0)
MCH: 32 pg (ref 26.0–34.0)
MCHC: 33.2 g/dL (ref 30.0–36.0)
MCV: 96.4 fL (ref 80.0–100.0)
Monocytes Absolute: 0.5 10*3/uL (ref 0.1–1.0)
Monocytes Relative: 4 %
Neutro Abs: 10.3 10*3/uL — ABNORMAL HIGH (ref 1.7–7.7)
Neutrophils Relative %: 93 %
Platelets: 207 10*3/uL (ref 150–400)
RBC: 4.75 MIL/uL (ref 3.87–5.11)
RDW: 13.2 % (ref 11.5–15.5)
WBC: 11.2 10*3/uL — ABNORMAL HIGH (ref 4.0–10.5)
nRBC: 0 % (ref 0.0–0.2)

## 2019-10-05 LAB — LIPASE, BLOOD: Lipase: 28 U/L (ref 11–51)

## 2019-10-05 MED ORDER — SODIUM CHLORIDE 0.9 % IV BOLUS
500.0000 mL | Freq: Once | INTRAVENOUS | Status: AC
  Administered 2019-10-05: 500 mL via INTRAVENOUS

## 2019-10-05 MED ORDER — ONDANSETRON HCL 4 MG PO TABS: 4.0000 mg | ORAL_TABLET | Freq: Three times a day (TID) | ORAL | 0 refills | Status: DC | PRN

## 2019-10-05 MED ORDER — GADOBUTROL 1 MMOL/ML IV SOLN
7.2000 mL | Freq: Once | INTRAVENOUS | Status: AC | PRN
  Administered 2019-10-05: 7.2 mL via INTRAVENOUS

## 2019-10-05 MED ORDER — ONDANSETRON HCL 4 MG/2ML IJ SOLN
4.0000 mg | Freq: Once | INTRAMUSCULAR | Status: AC
  Administered 2019-10-05: 4 mg via INTRAVENOUS
  Filled 2019-10-05: qty 2

## 2019-10-05 NOTE — ED Notes (Signed)
Pt transported to MRI 

## 2019-10-05 NOTE — ED Notes (Signed)
Pt is dizzy on discharge-- dr Vernell Barrier aware-- instructed pt to not drive if dizzy, and to follow up with neurosurg

## 2019-10-05 NOTE — Progress Notes (Signed)
Orthopedic Tech Progress Note Patient Details:  Jessica Mullins 16, 1932 [id number]          [name] 10/05/2019, 8:07 AM

## 2019-10-05 NOTE — ED Provider Notes (Signed)
[facility] EMERGENCY DEPARTMENT Provider Note   CSN: [id number] Arrival date & time: 10/05/19  0754     History Chief Complaint  Patient presents with  . level 2/ Fall    Jessica Mullins is a 86 y.o. female.  The history is provided by the patient.  Fall This is a new problem. The current episode started less than 1 hour ago. The problem has not changed since onset.Pertinent negatives include no chest pain, no abdominal pain, no headaches and no shortness of breath. Nothing aggravates the symptoms. Nothing relieves the symptoms. She has tried nothing for the symptoms.       Past Medical History:  Diagnosis Date  . Allergy   . Ankle fracture, left   . Breast cancer ([place]) 1115/16   left   . Bursitis of right shoulder   . Cataract   . Family history of cancer   . Fracture of right wrist   . GERD (gastroesophageal reflux disease)   . Hyperlipidemia   . Hypertension   . Skin cancer 2000   melanoma and basal cell  . Vaso vagal episode    during preparation for colonoscopy    Patient Active Problem List   Diagnosis Date Noted  . Balance disorder 04/05/2019  . Constipation by delayed colonic transit 12/13/2017  . Hemorrhoids 02/14/2017  . Atrial fibrillation ([place]) 08/10/2015  . Orthostatic hypotension 08/04/2015  . Leukocytosis 07/27/2015  . Genetic testing 07/27/2015  . Family history of cancer   . Cancer of central portion of left female breast ([place]) 06/01/2015  . Bilateral low back pain without sciatica 11/18/2014  . Right shoulder pain 04/10/2014  . Muscle spasm of back 01/13/2014  . Olecranon bursitis of left elbow 08/26/2013  . Posterolateral cervical muscle strain 12/14/2012  . Hyperlipidemia 02/16/2010  . TMJ PAIN 02/16/2010  . SEBORRHEIC KERATOSIS 08/24/2009  . Essential hypertension 10/18/2007  . OSTEOPENIA 07/12/2007  . Melanoma of skin ([place]) 10/17/2006  . BREAST CYST 10/17/2006  . COLONOSCOPY, HX OF 10/17/2006    Past  Surgical History:  Procedure Laterality Date  . ABDOMINAL HYSTERECTOMY  1988   TAH/BSO--FIBROIDS  . APPENDECTOMY    . BREAST LUMPECTOMY     B/L--FCS  . BREAST LUMPECTOMY WITH RADIOACTIVE SEED AND SENTINEL LYMPH NODE BIOPSY Left 06/11/2015   Procedure: LEFT BREAST LUMPECTOMY WITH RADIOACTIVE SEED AND LEFT SENTINEL LYMPH NODE MAPPING;  Surgeon: Erroll Luna, MD;  Location: [place];  Service: General;  Laterality: Left;  . CATARACT EXTRACTION  2015  . COLONOSCOPY  2010  . DG  BONE DENSITY ([place] HX)    . EYE SURGERY Bilateral    cataracts  . fiberadenoma Bilateral 1978, 1980  . GANGLION CYST EXCISION     L  hand  . Lipiflow procedure       OB History   No obstetric history on file.     Family History  Problem Relation Age of Onset  . Cancer Mother        mets to bone--? primary  . Coronary artery disease Brother        died of M! @ 55  . Diabetes Brother   . Hyperlipidemia Brother   . Hypertension Brother   . Heart disease Brother        chf  . Heart disease Father 79       MI  . Lung cancer Sister 55       former smoker  . Diabetes Maternal Aunt   .  Lung cancer Other   . Thrombosis Other        thromboembolism clotting disorder--? father died of ? clot     Social History   Tobacco Use  . Smoking status: Never Smoker  . Smokeless tobacco: Never Used  Substance Use Topics  . Alcohol use: No  . Drug use: No    Home Medications Prior to Admission medications   Medication Sig Start Date End Date Taking? Authorizing Provider  alendronate (FOSAMAX) 70 MG tablet TAKE ONE TABLET BY MOUTH EVERY SEVEN DAYS. TAKE WITH GLASS OF WATER AT NOON ON ASSIGNED DAY ON EMPTY STOMACH 07/29/19   Carollee Herter, Alferd Apa, DO  AMBULATORY NON FORMULARY MEDICATION [place] use daily as directed.  Disp 1 Leg pain M79.604 05/21/19   Gregor Hams, MD  Biotin (BIOTIN 5000) 5 MG CAPS Take 5 mg by mouth daily.     [provider]  Calcium Carbonate-Vitamin D (CALTRATE 600+D) 600-400  MG-UNIT per tablet Take 1 tablet by mouth daily.     [provider]  Cholecalciferol (VITAMIN D3) 1000 UNITS CAPS Take 1 capsule by mouth daily.      [provider]  cycloSPORINE (RESTASIS) 0.05 % ophthalmic emulsion PLACE 1 DROP INTO BOTH EYES TWO (TWO) TIMES DAILY. 10/11/16   [provider]  diltiazem (CARDIZEM CD) 180 MG 24 hr capsule TAKE 1 CAPSULE (180 MG TOTAL) BY MOUTH DAILY. 06/19/19   Bensimhon, Shaune Pascal, MD  ELIQUIS 5 MG TABS tablet TAKE ONE TABLET BY MOUTH TWICE A DAY 09/20/19   Nicholas Lose, MD  flecainide (TAMBOCOR) 50 MG tablet TAKE ONE TABLET BY MOUTH TWICE A DAY 07/29/19   Bensimhon, Shaune Pascal, MD  ketoconazole (NIZORAL) 2 % cream APPLY 1 APPLICATION TOPICALLY DAILY AS NEEDED FOR IRRITATION 10/04/19   Carollee Herter, Alferd Apa, DO  Multiple Vitamins-Minerals (OCUVITE ADULT 50+) CAPS Take 1 capsule by mouth daily. 07/19/18   Nicholas Lose, MD  Omega-3 Fatty Acids (FISH OIL PO) Take 1 tablet by mouth daily.     [provider]  polyethylene glycol (MIRALAX / GLYCOLAX) packet Take 17 g by mouth daily as needed.    [provider]  pravastatin (PRAVACHOL) 40 MG tablet Take 1 tablet (40 mg total) by mouth daily. 04/05/19   Ann Held, DO  Propylene Glycol 0.6 % SOLN Apply 1 drop to eye daily as needed (dry eyes).     [provider]  Psyllium (METAMUCIL PO) Take 15 mLs by mouth daily.     [provider]  Turmeric 500 MG CAPS Take 1 capsule by mouth daily.     [provider]  vitamin B-12 (CYANOCOBALAMIN) 1000 MCG tablet Take 1 tablet (1,000 mcg total) by mouth daily. 07/19/18   Nicholas Lose, MD    Allergies    Sulfa antibiotics, Tape, Glucosamine forte [nutritional supplements], and Latex  Review of Systems   Review of Systems  Constitutional: Negative for chills and fever.  HENT: Negative for ear pain and sore throat.   Eyes: Negative for pain and visual disturbance.  Respiratory: Negative for cough and  shortness of breath.   Cardiovascular: Negative for chest pain and palpitations.  Gastrointestinal: Positive for diarrhea, nausea and vomiting. Negative for abdominal pain.  Genitourinary: Negative for dysuria and hematuria.  Musculoskeletal: Negative for arthralgias and back pain.  Skin: Negative for color change and rash.  Neurological: Negative for seizures, syncope and headaches.  All other systems reviewed and are negative.   Physical  Exam Updated Vital Signs BP 129/64   Pulse 79   Temp 98.3 F (36.8 C) (Oral)   Resp 14   Ht 5\' 1"  (1.549 m)   Wt 72.6 kg   SpO2 98%   BMI 30.23 kg/m   Physical Exam Vitals and nursing note reviewed.  Constitutional:      General: She is not in acute distress.    Appearance: She is well-developed.  HENT:     Head: Normocephalic and atraumatic.     Nose: Nose normal.     Mouth/Throat:     Mouth: Mucous membranes are moist.  Eyes:     Extraocular Movements: Extraocular movements intact.     Conjunctiva/sclera: Conjunctivae normal.     Pupils: Pupils are equal, round, and reactive to light.  Cardiovascular:     Rate and Rhythm: Normal rate and regular rhythm.     Pulses: Normal pulses.     Heart sounds: Normal heart sounds. No murmur.  Pulmonary:     Effort: Pulmonary effort is normal. No respiratory distress.     Breath sounds: Normal breath sounds.  Abdominal:     General: Abdomen is flat. There is no distension.     Palpations: Abdomen is soft.     Tenderness: There is no abdominal tenderness.  Musculoskeletal:        General: No deformity. Normal range of motion.     Cervical back: Normal range of motion and neck supple. Tenderness present.  Skin:    General: Skin is warm and dry.  Neurological:     General: No focal deficit present.     Mental Status: She is alert and oriented to person, place, and time.     Cranial Nerves: No cranial nerve deficit.     Sensory: No sensory deficit.     Motor: No weakness.      Coordination: Coordination normal.     Comments: 5+/5 strength throughout,normal sensation,no drift, normal finger to nose finger  Psychiatric:        Mood and Affect: Mood normal.     ED Results / Procedures / Treatments   Labs (all labs ordered are listed, but only abnormal results are displayed) Labs Reviewed  CBC WITH DIFFERENTIAL/PLATELET - Abnormal; Notable for the following components:      Result Value   WBC 11.2 (*)    Hemoglobin 15.2 (*)    Neutro Abs 10.3 (*)    Lymphs Abs 0.3 (*)    All other components within normal limits  COMPREHENSIVE METABOLIC PANEL - Abnormal; Notable for the following components:   Glucose, Bld 132 (*)    Calcium 8.6 (*)    Total Protein 5.6 (*)    Albumin 3.2 (*)    All other components within normal limits  LIPASE, BLOOD    EKG EKG Interpretation  Date/Time:  Saturday October 05 2019 07:59:32 EDT Ventricular Rate:  75 PR Interval:    QRS Duration: 91 QT Interval:  386 QTC Calculation: 432 R Axis:   -40 Text Interpretation: Sinus rhythm Left axis deviation Low voltage, precordial leads Abnormal R-wave progression, late transition Nonspecific T abnormalities, lateral leads Confirmed by Lennice Sites [phone]) on 10/05/2019 8:17:45 AM   Radiology CT Head Wo Contrast  Result Date: 10/05/2019 CLINICAL DATA:  Level 2 trauma. Weakness with fall today. Patient struck the back of her head and now complains of headache and neck pain. No loss of consciousness. EXAM: CT HEAD WITHOUT CONTRAST CT CERVICAL SPINE WITHOUT CONTRAST TECHNIQUE: Multidetector  CT imaging of the head and cervical spine was performed following the standard protocol without intravenous contrast. Multiplanar CT image reconstructions of the cervical spine were also generated. COMPARISON:  CT head 07/19/2004. FINDINGS: CT HEAD FINDINGS Brain: There is an extra-axial mass along the anterior right aspect of the foramen magnum which measures 2.6 x 2.1 cm on image 4/4. This extends  approximately 2.4 cm in height on coronal image 42/5. This mass is isodense to brain and is noncalcified. There is no osseous erosion or reactive sclerosis. This mass exerts mass effect on the pons and medulla which are displaced posteriorly and to the left. There is no evidence of acute intracranial hemorrhage, extra-axial fluid collection or hydrocephalus. There is progressive atrophy with prominence of the ventricles and subarachnoid spaces. Mild chronic small vessel ischemic changes are present in the periventricular white matter. There is no CT evidence of acute cortical infarction. Vascular: Intracranial vascular calcifications. The mass at the foramen magnum abuts the right vertebral and basilar arteries. No hyperdense vessel identified. Skull: Negative for fracture or focal lesion. No skull base foraminal erosions. Sinuses/Orbits: The visualized paranasal sinuses and mastoid air cells are clear. No orbital abnormalities are seen. Other: Previous bilateral lens surgery. CT CERVICAL SPINE FINDINGS Alignment: Mild scoliosis with near anatomic sagittal alignment. Skull base and vertebrae: No evidence of acute cervical spine fracture or traumatic subluxation. Soft tissues and spinal canal: No prevertebral fluid or swelling. No visible canal hematoma. As above, there is a soft tissue mass in the posterior fossa and foramen magnum on the right with mass effect on the craniocervical junction which is displaced posteriorly to the left. Disc levels: Multilevel spondylosis with loss of disc height, uncinate spurring and facet hypertrophy, greatest from C3-4 through C6-7. There is mild resulting osseous foraminal narrowing, worse on the right. Upper chest: Unremarkable. Other: Bilateral carotid atherosclerosis. IMPRESSION: 1. No acute intracranial or calvarial findings. 2. Large extra-axial mass along the anterior right aspect of the foramen magnum with mass effect on the brainstem, but no resulting hydrocephalus.  Differential considerations include meningioma, nerve sheath tumor and aneurysm. Recommend further evaluation with cranial MRI without and with contrast. 3. No evidence of acute cervical spine fracture, traumatic subluxation or static signs of instability. 4. Multilevel spondylosis as described. Electronically Signed   By: Richardean Sale M.D.   On: 10/05/2019 09:14   CT CERVICAL SPINE WO CONTRAST  Result Date: 10/05/2019 CLINICAL DATA:  Level 2 trauma. Weakness with fall today. Patient struck the back of her head and now complains of headache and neck pain. No loss of consciousness. EXAM: CT HEAD WITHOUT CONTRAST CT CERVICAL SPINE WITHOUT CONTRAST TECHNIQUE: Multidetector CT imaging of the head and cervical spine was performed following the standard protocol without intravenous contrast. Multiplanar CT image reconstructions of the cervical spine were also generated. COMPARISON:  CT head 07/19/2004. FINDINGS: CT HEAD FINDINGS Brain: There is an extra-axial mass along the anterior right aspect of the foramen magnum which measures 2.6 x 2.1 cm on image 4/4. This extends approximately 2.4 cm in height on coronal image 42/5. This mass is isodense to brain and is noncalcified. There is no osseous erosion or reactive sclerosis. This mass exerts mass effect on the pons and medulla which are displaced posteriorly and to the left. There is no evidence of acute intracranial hemorrhage, extra-axial fluid collection or hydrocephalus. There is progressive atrophy with prominence of the ventricles and subarachnoid spaces. Mild chronic small vessel ischemic changes are present in the periventricular white matter.  There is no CT evidence of acute cortical infarction. Vascular: Intracranial vascular calcifications. The mass at the foramen magnum abuts the right vertebral and basilar arteries. No hyperdense vessel identified. Skull: Negative for fracture or focal lesion. No skull base foraminal erosions. Sinuses/Orbits: The  visualized paranasal sinuses and mastoid air cells are clear. No orbital abnormalities are seen. Other: Previous bilateral lens surgery. CT CERVICAL SPINE FINDINGS Alignment: Mild scoliosis with near anatomic sagittal alignment. Skull base and vertebrae: No evidence of acute cervical spine fracture or traumatic subluxation. Soft tissues and spinal canal: No prevertebral fluid or swelling. No visible canal hematoma. As above, there is a soft tissue mass in the posterior fossa and foramen magnum on the right with mass effect on the craniocervical junction which is displaced posteriorly to the left. Disc levels: Multilevel spondylosis with loss of disc height, uncinate spurring and facet hypertrophy, greatest from C3-4 through C6-7. There is mild resulting osseous foraminal narrowing, worse on the right. Upper chest: Unremarkable. Other: Bilateral carotid atherosclerosis. IMPRESSION: 1. No acute intracranial or calvarial findings. 2. Large extra-axial mass along the anterior right aspect of the foramen magnum with mass effect on the brainstem, but no resulting hydrocephalus. Differential considerations include meningioma, nerve sheath tumor and aneurysm. Recommend further evaluation with cranial MRI without and with contrast. 3. No evidence of acute cervical spine fracture, traumatic subluxation or static signs of instability. 4. Multilevel spondylosis as described. Electronically Signed   By: Richardean Sale M.D.   On: 10/05/2019 09:14   MR Brain W and Wo Contrast  Result Date: 10/05/2019 CLINICAL DATA:  Headache, abnormal CT EXAM: MRI HEAD WITHOUT AND WITH CONTRAST TECHNIQUE: Multiplanar, multiecho pulse sequences of the brain and surrounding structures were obtained without and with intravenous contrast. CONTRAST:  7.64mL GADAVIST GADOBUTROL 1 MMOL/ML IV SOLN COMPARISON:  Correlation made with CT head earlier same day FINDINGS: Brain: There is a 2.6 x 2 x 2.7 cm homogeneously enhancing and dural-based extra-axial  mass at the right anterolateral aspect of the foramen magnum extending just below the craniocervical junction. There is compression of the adjacent medulla and craniocervical junction. No parenchymal edema. There is no acute infarction or intracranial hemorrhage. There is no parenchymal mass or abnormal enhancement. Patchy T2 hyperintensity in the supratentorial white matter is nonspecific but may reflect mild to moderate chronic microvascular ischemic changes. There is no hydrocephalus or extra-axial fluid collection. Vascular: Right vertebral artery flow void is preserved and courses in between above mass and brainstem. Major vessel flow voids at the skull base are otherwise preserved. Skull and upper cervical spine: Normal marrow signal is preserved. Sinuses/Orbits: Paranasal sinuses are aerated. Orbits are unremarkable. Other: Sella is unremarkable.  Mastoid air cells are clear. IMPRESSION: Dural-based extra-axial mass compressing the right anterolateral medulla and craniocervical junction without parenchymal edema. Appearance is most consistent with a meningioma. Electronically Signed   By: Macy Mis M.D.   On: 10/05/2019 11:54   DG Chest Portable 1 View  Result Date: 10/05/2019 CLINICAL DATA:  Fall EXAM: PORTABLE CHEST 1 VIEW COMPARISON:  2018 FINDINGS: The heart size and mediastinal contours are within normal limits. Both lungs are clear. No pleural effusion or pneumothorax. The visualized skeletal structures are intact. Surgical clips overlie the left breast. IMPRESSION: No acute process in the chest. Electronically Signed   By: Macy Mis M.D.   On: 10/05/2019 08:25    Procedures Procedures (including critical care time)  Medications Ordered in ED Medications  sodium chloride 0.9 % bolus 500 mL (0  mLs Intravenous Stopped 10/05/19 0859)  ondansetron (ZOFRAN) injection 4 mg (4 mg Intravenous Given 10/05/19 0857)  gadobutrol (GADAVIST) 1 MMOL/ML injection 7.2 mL (7.2 mLs Intravenous  Contrast Given 10/05/19 1137)    ED Course  I have reviewed the triage vital signs and the nursing notes.  Pertinent labs & imaging results that were available during my care of the patient were reviewed by me and considered in my medical decision making (see chart for details).    MDM Rules/Calculators/A&P                      Nikesha L Gismondi is an 86 year old female with history of hypertension, high cholesterol who presents to the ED after a fall.  Level 2 trauma as patient is on blood thinner.  Has pain in the back of her neck.  Patient has had some nausea, vomiting, diarrhea since last night.  Patient tripped and fell going to the bathroom.  Her legs have felt weak because she feels dehydrated.  Possibly due to salmon that she ate last night.  Patient has overall normal neurological exam.  Patient given IV fluids, IV Zofran.  Lab work showed no significant anemia, electrolyte abnormality, kidney injury.  No abdominal tenderness.  No concern for bowel obstruction or other intra-abdominal process.  CT scan of the head and neck was performed for traumatic reasons and were overall unremarkable except for CT of the head showed possible aneurysm versus meningioma versus other tumor and radiology recommended MRI.  MRI confirmed a meningioma.  This mass is compressing on the right anterior lateral medulla and cervical cranial junction but there is no edema.  It appears most consistent with a meningioma.  Talk with Dr. [name] with neurosurgery who will follow up with the patient next week.  Patient likely not a surgical candidate given her age.  Overall she has normal neurological exam.  She has had some chronic ambulation issues.  However, we will have her follow-up with neurosurgery to see if there are any other treatment options if any.  Patient was made aware of these findings and given return precautions.  Discharged in good condition.  This chart was dictated using voice recognition software.   Despite best efforts to proofread,  errors can occur which can change the documentation meaning.    Final Clinical Impression(s) / ED Diagnoses Final diagnoses:  Fall, initial encounter  Meningioma [facility])    Rx / DC Orders ED Discharge Orders    None       Lennice Sites, DO 10/05/19 1316

## 2019-10-21 DIAGNOSIS — H353132 Nonexudative age-related macular degeneration, bilateral, intermediate dry stage: Secondary | ICD-10-CM | POA: Diagnosis not present

## 2019-10-21 DIAGNOSIS — H35033 Hypertensive retinopathy, bilateral: Secondary | ICD-10-CM | POA: Diagnosis not present

## 2019-10-21 DIAGNOSIS — H26491 Other secondary cataract, right eye: Secondary | ICD-10-CM | POA: Diagnosis not present

## 2019-10-22 DIAGNOSIS — D329 Benign neoplasm of meninges, unspecified: Secondary | ICD-10-CM | POA: Diagnosis not present

## 2019-11-05 DIAGNOSIS — M79672 Pain in left foot: Secondary | ICD-10-CM | POA: Diagnosis not present

## 2019-11-05 DIAGNOSIS — Q6689 Other  specified congenital deformities of feet: Secondary | ICD-10-CM | POA: Diagnosis not present

## 2019-11-05 DIAGNOSIS — M79671 Pain in right foot: Secondary | ICD-10-CM | POA: Diagnosis not present

## 2019-11-05 DIAGNOSIS — L602 Onychogryphosis: Secondary | ICD-10-CM | POA: Diagnosis not present

## 2019-11-05 DIAGNOSIS — L84 Corns and callosities: Secondary | ICD-10-CM | POA: Diagnosis not present

## 2019-11-13 NOTE — Progress Notes (Signed)
I connected with Shalisa today by telephone and verified that I am speaking with the correct person using two identifiers. Location patient: home Location provider: work Persons participating in the virtual visit: patient, Therapist, sports.    I discussed the limitations, risks, security and privacy concerns of performing an evaluation and management service by telephone and the availability of in person appointments. I also discussed with the patient that there may be a patient responsible charge related to this service. The patient expressed understanding and verbally consented to this telephonic visit.    Interactive audio and video telecommunications were attempted between RN and patient, however failed, due to patient having technical difficulties OR patient did not have access to video capability.  We continued and completed visit with audio only.  Some vital signs may be absent or patient reported.    Subjective:   Jessica Mullins is a 86 y.o. female who presents for Medicare Annual (Subsequent) preventive examination.  Pt involved in book club and computer club.  Review of Systems:  Home Safety/Smoke Alarms: Feels safe in home. Smoke alarms in place.  Lives at [facility] in independent living . Walk-in shower. Has emergency pull cords. Has a walker, does not use it. Uses cane when going out.    Female:    Mammo- 05/16/19      Dexa scan-11/23/18           Objective:     Vitals: Unable to assess. This visit is enabled though telemedicine due to Covid 19.   Advanced Directives 11/14/2019 07/08/2019 11/12/2018 04/11/2018 12/26/2017 12/06/2017 11/09/2017  Does Patient Have a Medical Advance Directive? Yes Yes Yes No Yes Yes Yes  Type of Paramedic of [place];Living will [place];Living will [place];Living will - [place];Living will [place];Living will [place];Living will  Does patient want to make changes to medical advance directive? No - Patient declined - No - Guardian declined - No - Patient declined Yes (MAU/Ambulatory/Procedural Areas - Information given) -  Copy of [place] in Chart? No - copy requested Yes - validated most recent copy scanned in chart (See row information) Yes - validated most recent copy scanned in chart (See row information) - Yes Yes No - copy requested  Would patient like information on creating a medical advance directive? - - - No - Patient declined - - -    Tobacco Social History   Tobacco Use  Smoking Status Never Smoker  Smokeless Tobacco Never Used     Counseling given: Not Answered   Clinical Intake: Pain : No/denies pain     Past Medical History:  Diagnosis Date  . Allergy   . Ankle fracture, left   . Breast cancer ([place]) 1115/16   left   . Bursitis of right shoulder   . Cataract   . Family history of cancer   . Fracture of right wrist   . GERD (gastroesophageal reflux disease)   . Hyperlipidemia   . Hypertension   . Skin cancer 2000   melanoma and basal cell  . Vaso vagal episode    during preparation for colonoscopy   Past Surgical History:  Procedure Laterality Date  . ABDOMINAL HYSTERECTOMY  1988   TAH/BSO--FIBROIDS  . APPENDECTOMY    . BREAST LUMPECTOMY     B/L--[facility]  . BREAST LUMPECTOMY WITH RADIOACTIVE SEED AND SENTINEL LYMPH NODE BIOPSY Left 06/11/2015   Procedure:  LEFT BREAST LUMPECTOMY WITH RADIOACTIVE SEED AND LEFT SENTINEL LYMPH NODE MAPPING;  Surgeon: Erroll Luna, MD;  Location: [facility];  Service: General;  Laterality: Left;  . CATARACT EXTRACTION  2015  . COLONOSCOPY  2010  . DG  BONE DENSITY ([place] [facility])    . EYE SURGERY Bilateral    cataracts  . fiberadenoma Bilateral 1978, 1980  . GANGLION CYST EXCISION     L  hand  . Lipiflow procedure     Family History  Problem Relation Age of Onset  . Cancer Mother        mets to bone--? primary    . Coronary artery disease Brother        died of M! @ 60  . Diabetes Brother   . Hyperlipidemia Brother   . Hypertension Brother   . Heart disease Brother        chf  . Heart disease Father 43       MI  . Lung cancer Sister 52       former smoker  . Diabetes Maternal Aunt   . Lung cancer Other   . Thrombosis Other        thromboembolism clotting disorder--? father died of ? clot    Social History   Socioeconomic History  . Marital status: Widowed    Spouse name: Not on file  . Number of children: 2  . Years of education: Not on file  . Highest education level: Master's degree (e.g., MA, MS, MEng, MEd, [name], [name])  Occupational History  . Occupation: Product manager: RETIRED    Comment: retired  Tobacco Use  . Smoking status: Never Smoker  . Smokeless tobacco: Never Used  Substance and Sexual Activity  . Alcohol use: No  . Drug use: No  . Sexual activity: Never  Other Topics Concern  . Not on file  Social History Narrative   Patient is widowed.  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    One son one daughter   1 caffeinated beverage daily   She is right-handed.    She works out everyday to a televised [employer] 30 minute program.   No tobacco or alcohol   Social Determinants of Radio broadcast assistant Strain: Low Risk   . Difficulty of Paying Living Expenses: Not hard at all  Food Insecurity: No Food Insecurity  . Worried About Charity fundraiser in the Last Year: Never true  . Ran Out of Food in the Last Year: Never true  Transportation Needs: No Transportation Needs  . Lack of Transportation (Medical): No  . Lack of Transportation (Non-Medical): No  Physical Activity:   . Days of Exercise per Week:   . Minutes of Exercise per Session:   Stress:   . Feeling of Stress :   Social Connections:   . Frequency of Communication with Friends and Family:   . Frequency of Social Gatherings with Friends and Family:   . Attends Religious Services:    . Active Member of Clubs or Organizations:   . Attends Archivist Meetings:   [name] Marital Status:     Outpatient Encounter Medications as of 11/14/2019  Medication Sig  . alendronate (FOSAMAX) 70 MG tablet TAKE ONE TABLET BY MOUTH EVERY SEVEN DAYS. TAKE WITH GLASS OF WATER AT NOON ON ASSIGNED DAY ON EMPTY STOMACH  . AMBULATORY NON FORMULARY MEDICATION Walker use daily as directed.  Disp 1 Leg pain M79.604  .  Biotin (BIOTIN 5000) 5 MG CAPS Take 5 mg by mouth daily.   . Calcium Carbonate-Vitamin D (CALTRATE 600+D) 600-400 MG-UNIT per tablet Take 1 tablet by mouth daily.   . Cholecalciferol (VITAMIN D3) 1000 UNITS CAPS Take 1 capsule by mouth daily.    . cycloSPORINE (RESTASIS) 0.05 % ophthalmic emulsion PLACE 1 DROP INTO BOTH EYES TWO (TWO) TIMES DAILY.  [name] diltiazem (CARDIZEM CD) 180 MG 24 hr capsule TAKE 1 CAPSULE (180 MG TOTAL) BY MOUTH DAILY.  [name] ELIQUIS 5 MG TABS tablet TAKE ONE TABLET BY MOUTH TWICE A DAY  . flecainide (TAMBOCOR) 50 MG tablet TAKE ONE TABLET BY MOUTH TWICE A DAY  . ketoconazole (NIZORAL) 2 % cream APPLY 1 APPLICATION TOPICALLY DAILY AS NEEDED FOR IRRITATION  . Multiple Vitamins-Minerals (OCUVITE ADULT 50+) CAPS Take 1 capsule by mouth daily.  . Omega-3 Fatty Acids (FISH OIL PO) Take 1 tablet by mouth daily.   . polyethylene glycol (MIRALAX / GLYCOLAX) packet Take 17 g by mouth daily as needed.  . pravastatin (PRAVACHOL) 40 MG tablet Take 1 tablet (40 mg total) by mouth daily.  [name] Propylene Glycol 0.6 % SOLN Apply 1 drop to eye daily as needed (dry eyes).   . Psyllium (METAMUCIL PO) Take 15 mLs by mouth daily.   . Turmeric 500 MG CAPS Take 1 capsule by mouth daily.   . vitamin B-12 (CYANOCOBALAMIN) 1000 MCG tablet Take 1 tablet (1,000 mcg total) by mouth daily.  . ondansetron (ZOFRAN) 4 MG tablet Take 1 tablet (4 mg total) by mouth every 8 (eight) hours as needed for up to 20 doses for nausea or vomiting. (Patient not taking: Reported on 11/14/2019)   No  facility-administered encounter medications on file as of 11/14/2019.    Activities of Daily Living In your present state of health, do you have any difficulty performing the following activities: 11/14/2019  Hearing? N  Vision? N  Difficulty concentrating or making decisions? N  Walking or climbing stairs? N  Dressing or bathing? N  Doing errands, shopping? N  Preparing Food and eating ? N  Using the Toilet? N  In the past six months, have you accidently leaked urine? Y  Do you have problems with loss of bowel control? N  Managing your Medications? N  Managing your Finances? N  Housekeeping or managing your Housekeeping? N  Some recent data might be hidden    Patient Care Team: Carollee Herter, Alferd Apa, DO as PCP - General Nicholas Lose, MD as Consulting Physician (Hematology and Oncology) Allyn Kenner, MD as Consulting Physician (Dermatology) Monna Fam, MD as Consulting Physician (Ophthalmology)    Assessment:   This is a routine wellness examination for Jessica Mullins. Physical assessment deferred to PCP.  Exercise Activities and Dietary recommendations Current Exercise Habits: The patient does not participate in regular exercise at present, Exercise limited by: None identified Diet (meal preparation, eat out, water intake, caffeinated beverages, dairy products, fruits and vegetables): well balanced    Goals    . Maintain healthy lifestyle       Fall Risk Fall Risk  11/14/2019 05/21/2019 11/12/2018 02/01/2018 11/09/2017  Falls in the past year? 1 - 1 Yes No  Number falls in past yr: 0 - 1 2 or more -  Comment - - - - -  Injury with Fall? 1 - 0 Yes -  Risk Factor Category  - - - High Fall Risk -  Risk for fall due to : Impaired balance/gait Impaired balance/gait Impaired balance/gait - -  Risk for fall due to: Comment - - - - -  Follow up Education provided;Falls prevention discussed Falls prevention discussed - - -   Depression Screen PHQ 2/9 Scores 11/14/2019 11/12/2018  11/09/2017 09/13/2016  PHQ - 2 Score 0 0 0 0     Cognitive Function   MMSE - Mini Mental State Exam 11/09/2017 09/13/2016  Orientation to time 5 5  Orientation to Place 5 5  Registration 3 3  Attention/ Calculation 5 5  Recall 2 3  Language- name 2 objects 2 2  Language- repeat 1 1  Language- follow 3 step command 3 3  Language- read & follow direction 1 1  Write a sentence 1 1  Copy design 1 1  Total score 29 30     6CIT Screen 11/14/2019  What Year? 0 points  What month? 0 points  What time? 0 points  Count back from 20 0 points  Months in reverse 0 points  Repeat phrase 0 points  Total Score 0    Immunization History  Administered Date(s) Administered  . H1N1 07/01/2008  . Influenza Whole 04/10/2007, 04/01/2009, 03/26/2010, 03/25/2012  . Influenza, High Dose Seasonal PF 04/16/2014, 04/09/2015, 04/03/2017, 04/19/2018, 02/28/2019  . Influenza-Unspecified 06/27/2016, 04/03/2017  . PFIZER SARS-COV-2 Vaccination 07/18/2019, 08/08/2019  . Pneumococcal Conjugate-13 05/13/2014  . Pneumococcal Polysaccharide-23 08/29/2016  . Td 11/05/2003  . Tdap 06/28/2011, 09/19/2011  . Zoster 02/24/2010  . Zoster Recombinat (Shingrix) 10/08/2016, 01/03/2017    Screening Tests Health Maintenance  Topic Date Due  . INFLUENZA VACCINE  01/26/2020  . MAMMOGRAM  05/15/2020  . TETANUS/TDAP  09/18/2021  . DEXA SCAN  Completed  . COVID-19 Vaccine  Completed  . PNA vac Low Risk Adult  Completed      Plan:    Please schedule your next medicare wellness visit with me in 1 yr.  Continue to eat heart healthy diet (full of fruits, vegetables, whole grains, lean protein, water--limit salt, fat, and sugar intake) and increase physical activity as tolerated.  Continue doing brain stimulating activities (puzzles, reading, adult coloring books, staying active) to keep memory sharp.    I have personally reviewed and noted the following in the patient's chart:   . Medical and social  history . Use of alcohol, tobacco or illicit drugs  . Current medications and supplements . Functional ability and status . Nutritional status . Physical activity . Advanced directives . List of other physicians . Hospitalizations, surgeries, and ER visits in previous 12 months . Vitals . Screenings to include cognitive, depression, and falls . Referrals and appointments  In addition, I have reviewed and discussed with patient certain preventive protocols, quality metrics, and best practice recommendations. A written personalized care plan for preventive services as well as general preventive health recommendations were provided to patient.     Shela Nevin, [place]  11/14/2019

## 2019-11-14 ENCOUNTER — Other Ambulatory Visit: Payer: Self-pay

## 2019-11-14 ENCOUNTER — Ambulatory Visit (INDEPENDENT_AMBULATORY_CARE_PROVIDER_SITE_OTHER): Payer: Medicare PPO | Admitting: *Deleted

## 2019-11-14 ENCOUNTER — Encounter: Payer: Self-pay | Admitting: *Deleted

## 2019-11-14 DIAGNOSIS — Z0000 Encounter for general adult medical examination without abnormal findings: Secondary | ICD-10-CM | POA: Diagnosis not present

## 2019-11-14 NOTE — Patient Instructions (Signed)
Please schedule your next medicare wellness visit with me in 1 yr.  Continue to eat heart healthy diet (full of fruits, vegetables, whole grains, lean protein, water--limit salt, fat, and sugar intake) and increase physical activity as tolerated.  Continue doing brain stimulating activities (puzzles, reading, adult coloring books, staying active) to keep memory sharp.    Jessica Mullins , Thank you for taking time to come for your Medicare Wellness Visit. I appreciate your ongoing commitment to your health goals. Please review the following plan we discussed and let me know if I can assist you in the future.   These are the goals we discussed: Goals    . Maintain healthy lifestyle       This is a list of the screening recommended for you and due dates:  Health Maintenance  Topic Date Due  . Flu Shot  01/26/2020  . Mammogram  05/15/2020  . Tetanus Vaccine  09/18/2021  . DEXA scan (bone density measurement)  Completed  . COVID-19 Vaccine  Completed  . Pneumonia vaccines  Completed    Preventive Care 38 Years and Older, Female Preventive care refers to lifestyle choices and visits with your health care provider that can promote health and wellness. This includes:  A yearly physical exam. This is also called an annual well check.  Regular dental and eye exams.  Immunizations.  Screening for certain conditions.  Healthy lifestyle choices, such as diet and exercise. What can I expect for my preventive care visit? Physical exam Your health care provider will check:  Height and weight. These may be used to calculate body mass index (BMI), which is a measurement that tells if you are at a healthy weight.  Heart rate and blood pressure.  Your skin for abnormal spots. Counseling Your health care provider may ask you questions about:  Alcohol, tobacco, and drug use.  Emotional well-being.  Home and relationship well-being.  Sexual activity.  Eating habits.  History of  falls.  Memory and ability to understand (cognition).  Work and work Statistician.  Pregnancy and menstrual history. What immunizations do I need?  Influenza (flu) vaccine  This is recommended every year. Tetanus, diphtheria, and pertussis (Tdap) vaccine  You may need a Td booster every 10 years. Varicella (chickenpox) vaccine  You may need this vaccine if you have not already been vaccinated. Zoster (shingles) vaccine  You may need this after age 61. Pneumococcal conjugate (PCV13) vaccine  One dose is recommended after age 66. Pneumococcal polysaccharide (PPSV23) vaccine  One dose is recommended after age 85. Measles, mumps, and rubella (MMR) vaccine  You may need at least one dose of MMR if you were born in 1957 or later. You may also need a second dose. Meningococcal conjugate (MenACWY) vaccine  You may need this if you have certain conditions. Hepatitis A vaccine  You may need this if you have certain conditions or if you travel or work in places where you may be exposed to hepatitis A. Hepatitis B vaccine  You may need this if you have certain conditions or if you travel or work in places where you may be exposed to hepatitis B. Haemophilus influenzae type b (Hib) vaccine  You may need this if you have certain conditions. You may receive vaccines as individual doses or as more than one vaccine together in one shot (combination vaccines). Talk with your health care provider about the risks and benefits of combination vaccines. What tests do I need? Blood tests  Lipid and  cholesterol levels. These may be checked every 5 years, or more frequently depending on your overall health.  Hepatitis C test.  Hepatitis B test. Screening  Lung cancer screening. You may have this screening every year starting at age 47 if you have a 30-pack-year history of smoking and currently smoke or have quit within the past 15 years.  Colorectal cancer screening. All adults should  have this screening starting at age 32 and continuing until age 25. Your health care provider may recommend screening at age 38 if you are at increased risk. You will have tests every 1-10 years, depending on your results and the type of screening test.  Diabetes screening. This is done by checking your blood sugar (glucose) after you have not eaten for a while (fasting). You may have this done every 1-3 years.  Mammogram. This may be done every 1-2 years. Talk with your health care provider about how often you should have regular mammograms.  BRCA-related cancer screening. This may be done if you have a family history of breast, ovarian, tubal, or peritoneal cancers. Other tests  Sexually transmitted disease (STD) testing.  Bone density scan. This is done to screen for osteoporosis. You may have this done starting at age 29. Follow these instructions at home: Eating and drinking  Eat a diet that includes fresh fruits and vegetables, whole grains, lean protein, and low-fat dairy products. Limit your intake of foods with high amounts of sugar, saturated fats, and salt.  Take vitamin and mineral supplements as recommended by your health care provider.  Do not drink alcohol if your health care provider tells you not to drink.  If you drink alcohol: ? Limit how much you have to 0-1 drink a day. ? Be aware of how much alcohol is in your drink. In the [place] one drink equals one 12 oz bottle of beer (355 mL), one 5 oz glass of wine (148 mL), or one 1 oz glass of hard liquor (44 mL). Lifestyle  Take daily care of your teeth and gums.  Stay active. Exercise for at least 30 minutes on 5 or more days each week.  Do not use any products that contain nicotine or tobacco, such as cigarettes, e-cigarettes, and chewing tobacco. If you need help quitting, ask your health care provider.  If you are sexually active, practice safe sex. Use a condom or other form of protection in order to prevent STIs  (sexually transmitted infections).  Talk with your health care provider about taking a low-dose aspirin or statin. What's next?  Go to your health care provider once a year for a well check visit.  Ask your health care provider how often you should have your eyes and teeth checked.  Stay up to date on all vaccines. This information is not intended to replace advice given to you by your health care provider. Make sure you discuss any questions you have with your health care provider. Document Revised: 06/07/2018 Document Reviewed: 06/07/2018 Elsevier Patient Education  2020 [employer].

## 2019-11-19 ENCOUNTER — Encounter: Payer: Self-pay | Admitting: Family Medicine

## 2019-11-19 ENCOUNTER — Other Ambulatory Visit: Payer: Self-pay

## 2019-11-19 ENCOUNTER — Ambulatory Visit: Payer: Medicare PPO | Admitting: Family Medicine

## 2019-11-19 DIAGNOSIS — R2689 Other abnormalities of gait and mobility: Secondary | ICD-10-CM | POA: Diagnosis not present

## 2019-11-19 DIAGNOSIS — D72829 Elevated white blood cell count, unspecified: Secondary | ICD-10-CM

## 2019-11-19 DIAGNOSIS — E785 Hyperlipidemia, unspecified: Secondary | ICD-10-CM

## 2019-11-19 LAB — COMPREHENSIVE METABOLIC PANEL
ALT: 16 U/L (ref 0–35)
AST: 16 U/L (ref 0–37)
Albumin: 4.2 g/dL (ref 3.5–5.2)
Alkaline Phosphatase: 67 U/L (ref 39–117)
BUN: 16 mg/dL (ref 6–23)
CO2: 32 mEq/L (ref 19–32)
Calcium: 10.1 mg/dL (ref 8.4–10.5)
Chloride: 103 mEq/L (ref 96–112)
Creatinine, Ser: 0.71 mg/dL (ref 0.40–1.20)
GFR: 77.88 mL/min (ref >60.00)
Glucose, Bld: 93 mg/dL (ref 70–99)
Potassium: 3.9 mEq/L (ref 3.5–5.1)
Sodium: 141 mEq/L (ref 135–145)
Total Bilirubin: 0.5 mg/dL (ref 0.2–1.2)
Total Protein: 6 g/dL (ref 6.0–8.3)

## 2019-11-19 LAB — CBC WITH DIFFERENTIAL/PLATELET
Basophils Absolute: 0 10*3/uL (ref 0.0–0.1)
Basophils Relative: 0.7 % (ref 0.0–3.0)
Eosinophils Absolute: 0.1 10*3/uL (ref 0.0–0.7)
Eosinophils Relative: 0.9 % (ref 0.0–5.0)
HCT: 42.8 % (ref 36.0–46.0)
Hemoglobin: 14.6 g/dL (ref 12.0–15.0)
Lymphocytes Relative: 20.5 % (ref 12.0–46.0)
Lymphs Abs: 1.4 10*3/uL (ref 0.7–4.0)
MCHC: 34.1 g/dL (ref 30.0–36.0)
MCV: 94.1 fl (ref 78.0–100.0)
Monocytes Absolute: 0.6 10*3/uL (ref 0.1–1.0)
Monocytes Relative: 8.6 % (ref 3.0–12.0)
Neutro Abs: 4.7 10*3/uL (ref 1.4–7.7)
Neutrophils Relative %: 69.3 % (ref 43.0–77.0)
Platelets: 213 10*3/uL (ref 150.0–400.0)
RBC: 4.55 Mil/uL (ref 3.87–5.11)
RDW: 14.3 % (ref 11.5–15.5)
WBC: 6.7 10*3/uL (ref 4.0–10.5)

## 2019-11-19 LAB — LIPID PANEL
Cholesterol: 155 mg/dL (ref 0–200)
HDL: 61.4 mg/dL (ref >39.00)
LDL Cholesterol: 76 mg/dL (ref 0–99)
NonHDL: 93.95
Total CHOL/HDL Ratio: 3
Triglycerides: 89 mg/dL (ref 0.0–149.0)
VLDL: 17.8 mg/dL (ref 0.0–40.0)

## 2019-11-19 NOTE — Assessment & Plan Note (Signed)
Tolerating statin, encouraged heart healthy diet, avoid trans fats, minimize simple carbs and saturated fats. Increase exercise as tolerated 

## 2019-11-19 NOTE — Progress Notes (Signed)
Patient ID: Jessica Mullins, female    DOB: 1931/04/06  Age: 86 y.o. MRN: [id number]    Subjective:  Subjective  HPI Jessica Mullins presents for f/u cholesterol and balance issues.  She had food poisoning and ended up in ER---- meningioma was found on mri brain-- she saw neurosurgery   Review of Systems  Constitutional: Negative for appetite change, diaphoresis, fatigue and unexpected weight change.  Eyes: Negative for pain, redness and visual disturbance.  Respiratory: Negative for cough, chest tightness, shortness of breath and wheezing.   Cardiovascular: Negative for chest pain, palpitations and leg swelling.  Endocrine: Negative for cold intolerance, heat intolerance, polydipsia, polyphagia and polyuria.  Genitourinary: Negative for difficulty urinating, dysuria and frequency.  Neurological: Negative for dizziness, light-headedness, numbness and headaches.             + balance issues  History Past Medical History:  Diagnosis Date  . Allergy   . Ankle fracture, left   . Breast cancer ([place]) 1115/16   left   . Bursitis of right shoulder   . Cataract   . Family history of cancer   . Fracture of right wrist   . GERD (gastroesophageal reflux disease)   . Hyperlipidemia   . Hypertension   . Skin cancer 2000   melanoma and basal cell  . Vaso vagal episode    during preparation for colonoscopy    She has a past surgical history that includes Appendectomy; Ganglion cyst excision; Breast lumpectomy; Lipiflow procedure; Cataract extraction (2015); Abdominal hysterectomy (1988); fiberadenoma (Bilateral, 1978, 1980); Colonoscopy (2010); DG  BONE DENSITY ([place] [facility]); Eye surgery (Bilateral); and Breast lumpectomy with radioactive seed and sentinel lymph node biopsy (Left, 06/11/2015).   Her family history includes Cancer in her mother; Coronary artery disease in her brother; Diabetes in her brother and maternal aunt; Heart disease in her brother; Heart disease (age of onset: 29) in her  father; Hyperlipidemia in her brother; Hypertension in her brother; Lung cancer in an other family member; Lung cancer (age of onset: 22) in her sister; Thrombosis in an other family member.She reports that she has never smoked. She has never used smokeless tobacco. She reports that she does not drink alcohol or use drugs.  Current Outpatient Medications on File Prior to Visit  Medication Sig Dispense Refill  . alendronate (FOSAMAX) 70 MG tablet TAKE ONE TABLET BY MOUTH EVERY SEVEN DAYS. TAKE WITH GLASS OF WATER AT NOON ON ASSIGNED DAY ON EMPTY STOMACH 4 tablet 3  . AMBULATORY NON FORMULARY MEDICATION Walker use daily as directed.  Disp 1 Leg pain M79.604 1 each 0  . Biotin (BIOTIN 5000) 5 MG CAPS Take 5 mg by mouth daily.     . Calcium Carbonate-Vitamin D (CALTRATE 600+D) 600-400 MG-UNIT per tablet Take 1 tablet by mouth daily.     . Cholecalciferol (VITAMIN D3) 1000 UNITS CAPS Take 1 capsule by mouth daily.      . cycloSPORINE (RESTASIS) 0.05 % ophthalmic emulsion PLACE 1 DROP INTO BOTH EYES TWO (TWO) TIMES DAILY.    [name] diltiazem (CARDIZEM CD) 180 MG 24 hr capsule TAKE 1 CAPSULE (180 MG TOTAL) BY MOUTH DAILY. 90 capsule 2  . ELIQUIS 5 MG TABS tablet TAKE ONE TABLET BY MOUTH TWICE A DAY 60 tablet 2  . flecainide (TAMBOCOR) 50 MG tablet TAKE ONE TABLET BY MOUTH TWICE A DAY 60 tablet 10  . ketoconazole (NIZORAL) 2 % cream APPLY 1 APPLICATION TOPICALLY DAILY AS NEEDED FOR IRRITATION 15 g 1  .  Multiple Vitamins-Minerals (OCUVITE ADULT 50+) CAPS Take 1 capsule by mouth daily.    . Omega-3 Fatty Acids (FISH OIL PO) Take 1 tablet by mouth daily.     . ondansetron (ZOFRAN) 4 MG tablet Take 1 tablet (4 mg total) by mouth every 8 (eight) hours as needed for up to 20 doses for nausea or vomiting. 20 tablet 0  . polyethylene glycol (MIRALAX / GLYCOLAX) packet Take 17 g by mouth daily as needed.    . pravastatin (PRAVACHOL) 40 MG tablet Take 1 tablet (40 mg total) by mouth daily. 90 tablet 1  . Propylene  Glycol 0.6 % SOLN Apply 1 drop to eye daily as needed (dry eyes).     . Psyllium (METAMUCIL PO) Take 15 mLs by mouth daily.     . Turmeric 500 MG CAPS Take 1 capsule by mouth daily.     . vitamin B-12 (CYANOCOBALAMIN) 1000 MCG tablet Take 1 tablet (1,000 mcg total) by mouth daily.     No current facility-administered medications on file prior to visit.     Objective:  Objective  Physical Exam Vitals and nursing note reviewed.  Constitutional:      Appearance: She is well-developed.  HENT:     Head: Normocephalic and atraumatic.  Eyes:     Conjunctiva/sclera: Conjunctivae normal.  Neck:     Thyroid: No thyromegaly.     Vascular: No carotid bruit or JVD.  Cardiovascular:     Rate and Rhythm: Normal rate and regular rhythm.     Heart sounds: Normal heart sounds. No murmur.  Pulmonary:     Effort: Pulmonary effort is normal. No respiratory distress.     Breath sounds: Normal breath sounds. No wheezing or rales.  Chest:     Chest wall: No tenderness.  Musculoskeletal:     Cervical back: Normal range of motion and neck supple.  Neurological:     Mental Status: She is alert and oriented to person, place, and time.    BP 130/68 (BP Location: Left Arm, Patient Position: Sitting, Cuff Size: Normal)   Pulse 77   Temp 97.7 F (36.5 C) (Temporal)   Resp 18   Ht 5\' 1"  (1.549 m)   Wt 147 lb 12.8 oz (67 kg)   SpO2 95%   BMI 27.93 kg/m  Wt Readings from Last 3 Encounters:  11/19/19 147 lb 12.8 oz (67 kg)  10/05/19 160 lb (72.6 kg)  07/22/19 156 lb 11.2 oz (71.1 kg)     Lab Results  Component Value Date   WBC 11.2 (H) 10/05/2019   HGB 15.2 (H) 10/05/2019   HCT 45.8 10/05/2019   PLT 207 10/05/2019   GLUCOSE 132 (H) 10/05/2019   CHOL 143 04/05/2019   TRIG 97.0 04/05/2019   HDL 63.30 04/05/2019   LDLDIRECT 153.4 11/16/2009   LDLCALC 61 04/05/2019   ALT 20 10/05/2019   AST 23 10/05/2019   NA 141 10/05/2019   K 3.9 10/05/2019   CL 107 10/05/2019   CREATININE 0.85  10/05/2019   BUN 22 10/05/2019   CO2 24 10/05/2019   TSH 3.490 11/07/2016   INR 1.20 10/21/2015    CT Head Wo Contrast  Result Date: 10/05/2019 CLINICAL DATA:  Level 2 trauma. Weakness with fall today. Patient struck the back of her head and now complains of headache and neck pain. No loss of consciousness. EXAM: CT HEAD WITHOUT CONTRAST CT CERVICAL SPINE WITHOUT CONTRAST TECHNIQUE: Multidetector CT imaging of the head and cervical spine was  performed following the standard protocol without intravenous contrast. Multiplanar CT image reconstructions of the cervical spine were also generated. COMPARISON:  CT head 07/19/2004. FINDINGS: CT HEAD FINDINGS Brain: There is an extra-axial mass along the anterior right aspect of the foramen magnum which measures 2.6 x 2.1 cm on image 4/4. This extends approximately 2.4 cm in height on coronal image 42/5. This mass is isodense to brain and is noncalcified. There is no osseous erosion or reactive sclerosis. This mass exerts mass effect on the pons and medulla which are displaced posteriorly and to the left. There is no evidence of acute intracranial hemorrhage, extra-axial fluid collection or hydrocephalus. There is progressive atrophy with prominence of the ventricles and subarachnoid spaces. Mild chronic small vessel ischemic changes are present in the periventricular white matter. There is no CT evidence of acute cortical infarction. Vascular: Intracranial vascular calcifications. The mass at the foramen magnum abuts the right vertebral and basilar arteries. No hyperdense vessel identified. Skull: Negative for fracture or focal lesion. No skull base foraminal erosions. Sinuses/Orbits: The visualized paranasal sinuses and mastoid air cells are clear. No orbital abnormalities are seen. Other: Previous bilateral lens surgery. CT CERVICAL SPINE FINDINGS Alignment: Mild scoliosis with near anatomic sagittal alignment. Skull base and vertebrae: No evidence of acute  cervical spine fracture or traumatic subluxation. Soft tissues and spinal canal: No prevertebral fluid or swelling. No visible canal hematoma. As above, there is a soft tissue mass in the posterior fossa and foramen magnum on the right with mass effect on the craniocervical junction which is displaced posteriorly to the left. Disc levels: Multilevel spondylosis with loss of disc height, uncinate spurring and facet hypertrophy, greatest from C3-4 through C6-7. There is mild resulting osseous foraminal narrowing, worse on the right. Upper chest: Unremarkable. Other: Bilateral carotid atherosclerosis. IMPRESSION: 1. No acute intracranial or calvarial findings. 2. Large extra-axial mass along the anterior right aspect of the foramen magnum with mass effect on the brainstem, but no resulting hydrocephalus. Differential considerations include meningioma, nerve sheath tumor and aneurysm. Recommend further evaluation with cranial MRI without and with contrast. 3. No evidence of acute cervical spine fracture, traumatic subluxation or static signs of instability. 4. Multilevel spondylosis as described. Electronically Signed   By: Richardean Sale M.D.   On: 10/05/2019 09:14   CT CERVICAL SPINE WO CONTRAST  Result Date: 10/05/2019 CLINICAL DATA:  Level 2 trauma. Weakness with fall today. Patient struck the back of her head and now complains of headache and neck pain. No loss of consciousness. EXAM: CT HEAD WITHOUT CONTRAST CT CERVICAL SPINE WITHOUT CONTRAST TECHNIQUE: Multidetector CT imaging of the head and cervical spine was performed following the standard protocol without intravenous contrast. Multiplanar CT image reconstructions of the cervical spine were also generated. COMPARISON:  CT head 07/19/2004. FINDINGS: CT HEAD FINDINGS Brain: There is an extra-axial mass along the anterior right aspect of the foramen magnum which measures 2.6 x 2.1 cm on image 4/4. This extends approximately 2.4 cm in height on coronal image  42/5. This mass is isodense to brain and is noncalcified. There is no osseous erosion or reactive sclerosis. This mass exerts mass effect on the pons and medulla which are displaced posteriorly and to the left. There is no evidence of acute intracranial hemorrhage, extra-axial fluid collection or hydrocephalus. There is progressive atrophy with prominence of the ventricles and subarachnoid spaces. Mild chronic small vessel ischemic changes are present in the periventricular white matter. There is no CT evidence of acute cortical infarction.  Vascular: Intracranial vascular calcifications. The mass at the foramen magnum abuts the right vertebral and basilar arteries. No hyperdense vessel identified. Skull: Negative for fracture or focal lesion. No skull base foraminal erosions. Sinuses/Orbits: The visualized paranasal sinuses and mastoid air cells are clear. No orbital abnormalities are seen. Other: Previous bilateral lens surgery. CT CERVICAL SPINE FINDINGS Alignment: Mild scoliosis with near anatomic sagittal alignment. Skull base and vertebrae: No evidence of acute cervical spine fracture or traumatic subluxation. Soft tissues and spinal canal: No prevertebral fluid or swelling. No visible canal hematoma. As above, there is a soft tissue mass in the posterior fossa and foramen magnum on the right with mass effect on the craniocervical junction which is displaced posteriorly to the left. Disc levels: Multilevel spondylosis with loss of disc height, uncinate spurring and facet hypertrophy, greatest from C3-4 through C6-7. There is mild resulting osseous foraminal narrowing, worse on the right. Upper chest: Unremarkable. Other: Bilateral carotid atherosclerosis. IMPRESSION: 1. No acute intracranial or calvarial findings. 2. Large extra-axial mass along the anterior right aspect of the foramen magnum with mass effect on the brainstem, but no resulting hydrocephalus. Differential considerations include meningioma, nerve  sheath tumor and aneurysm. Recommend further evaluation with cranial MRI without and with contrast. 3. No evidence of acute cervical spine fracture, traumatic subluxation or static signs of instability. 4. Multilevel spondylosis as described. Electronically Signed   By: Richardean Sale M.D.   On: 10/05/2019 09:14   MR Brain W and Wo Contrast  Result Date: 10/05/2019 CLINICAL DATA:  Headache, abnormal CT EXAM: MRI HEAD WITHOUT AND WITH CONTRAST TECHNIQUE: Multiplanar, multiecho pulse sequences of the brain and surrounding structures were obtained without and with intravenous contrast. CONTRAST:  7.62mL GADAVIST GADOBUTROL 1 MMOL/ML IV SOLN COMPARISON:  Correlation made with CT head earlier same day FINDINGS: Brain: There is a 2.6 x 2 x 2.7 cm homogeneously enhancing and dural-based extra-axial mass at the right anterolateral aspect of the foramen magnum extending just below the craniocervical junction. There is compression of the adjacent medulla and craniocervical junction. No parenchymal edema. There is no acute infarction or intracranial hemorrhage. There is no parenchymal mass or abnormal enhancement. Patchy T2 hyperintensity in the supratentorial white matter is nonspecific but may reflect mild to moderate chronic microvascular ischemic changes. There is no hydrocephalus or extra-axial fluid collection. Vascular: Right vertebral artery flow void is preserved and courses in between above mass and brainstem. Major vessel flow voids at the skull base are otherwise preserved. Skull and upper cervical spine: Normal marrow signal is preserved. Sinuses/Orbits: Paranasal sinuses are aerated. Orbits are unremarkable. Other: Sella is unremarkable.  Mastoid air cells are clear. IMPRESSION: Dural-based extra-axial mass compressing the right anterolateral medulla and craniocervical junction without parenchymal edema. Appearance is most consistent with a meningioma. Electronically Signed   By: Macy Mis M.D.   On:  10/05/2019 11:54   DG Chest Portable 1 View  Result Date: 10/05/2019 CLINICAL DATA:  Fall EXAM: PORTABLE CHEST 1 VIEW COMPARISON:  2018 FINDINGS: The heart size and mediastinal contours are within normal limits. Both lungs are clear. No pleural effusion or pneumothorax. The visualized skeletal structures are intact. Surgical clips overlie the left breast. IMPRESSION: No acute process in the chest. Electronically Signed   By: Macy Mis M.D.   On: 10/05/2019 08:25     Assessment & Plan:  Plan  I am having Tangy L. Armbrister maintain her Calcium Carbonate-Vitamin D, Vitamin D3, Omega-3 Fatty Acids (FISH OIL PO), Biotin, Turmeric, Propylene Glycol,  Psyllium (METAMUCIL PO), cycloSPORINE, polyethylene glycol, Ocuvite Adult 50+, vitamin B-12, pravastatin, AMBULATORY NON FORMULARY MEDICATION, diltiazem, alendronate, flecainide, Eliquis, ketoconazole, and ondansetron.  No orders of the defined types were placed in this encounter.   Problem List Items Addressed This Visit      Unprioritized   Balance disorder    Consider pt with home health --- pt will talk to [facility] and see what is available       Hyperlipidemia    Tolerating statin, encouraged heart healthy diet, avoid trans fats, minimize simple carbs and saturated fats. Increase exercise as tolerated      Relevant Orders   Lipid panel   Leukocytosis   Relevant Orders   CBC with Differential/Platelet    Other Visit Diagnoses    Serum calcium elevated    -  Primary   Relevant Orders   Comprehensive metabolic panel   PTH, intact (no Ca)      Follow-up: Return in about 6 months (around 05/21/2020) for annual exam, fasting.  Ann Held, DO

## 2019-11-19 NOTE — Patient Instructions (Addendum)

## 2019-11-19 NOTE — Assessment & Plan Note (Signed)
Consider pt with home health --- pt will talk to [facility] and see what is available

## 2019-11-20 ENCOUNTER — Other Ambulatory Visit: Payer: Self-pay

## 2019-11-20 DIAGNOSIS — E785 Hyperlipidemia, unspecified: Secondary | ICD-10-CM

## 2019-11-20 LAB — PARATHYROID HORMONE, INTACT (NO CA): PTH: 41 pg/mL (ref 14–64)

## 2019-11-20 MED ORDER — PRAVASTATIN SODIUM 40 MG PO TABS: 40.0000 mg | ORAL_TABLET | Freq: Every day | ORAL | 1 refills | Status: DC

## 2019-12-16 ENCOUNTER — Other Ambulatory Visit: Payer: Self-pay

## 2019-12-16 ENCOUNTER — Encounter: Payer: Self-pay | Admitting: Family Medicine

## 2019-12-16 ENCOUNTER — Ambulatory Visit: Payer: Medicare PPO | Admitting: Family Medicine

## 2019-12-16 VITALS — BP 132/78 | HR 59 | Ht 61.0 in | Wt 150.8 lb

## 2019-12-16 DIAGNOSIS — M5416 Radiculopathy, lumbar region: Secondary | ICD-10-CM

## 2019-12-16 MED ORDER — GABAPENTIN 100 MG PO CAPS: 100.0000 mg | ORAL_CAPSULE | Freq: Three times a day (TID) | ORAL | 3 refills | Status: DC | PRN

## 2019-12-16 NOTE — Patient Instructions (Addendum)
Thank you for coming in today.  Please call [facility] at [phone] to schedule the injection.  STOP Eliquis 24 hours (1 day) prior to the injection and start it 96 hours (4 days) after the injection.   For nerve pain start gabapentin 1-3 pills at bedtime as needed. Could take up to 1-3 pills 3x daily as needed.   Recheck with me as needed.   Let me know if you have a problem scheduling or worsen.

## 2019-12-16 NOTE — Progress Notes (Signed)
I, Jessica Mullins, LAT, ATC, am serving as scribe for Jessica Mullins.  Jessica Mullins is a 86 y.o. female who presents to [place] [facility] today for f/u of back/leg pain.  She was last seen by Jessica Mullins on 06/24/19 and was referred to PT of which she completed 12 visits.  She had a fall on 10/05/19 when she tripped going to the bathroom.  She had a R L4 nerve root block and transforaminal epidural on 06/04/19.  Since her last visit w/ Jessica Mullins, pt reports con't low back and R leg pain, running into her R calf.  She states that she has been doing supervised chair exercises at [place].  She notes increased pain since this past weekend w/ no specific MOI noted.  Diagnostic imaging: L-spine MRI- 05/29/19; R hip and L-spine XR- 05/10/19  Pertinent review of systems: No fevers or chills  Relevant historical information: Atrial fibrillation on Eliquis.  Rate controlled with flecainide and diltiazem   Exam:  BP 132/78 (BP Location: Right Arm, Patient Position: Sitting, Cuff Size: Normal)   Pulse (!) 59   Ht 5\' 1"  (1.549 m)   Wt 150 lb 12.8 oz (68.4 kg)   SpO2 98%   BMI 28.49 kg/m  General: Well Developed, well nourished, and in no acute distress.   MSK:  L-spine normal-appearing Reflexes and sensation intact distally bilateral lower extremities. Positive right-sided slump test. Lower extremity strength intact to foot dorsiflexion.    Lab and Radiology Results EXAM: MRI LUMBAR SPINE WITHOUT CONTRAST  TECHNIQUE: Multiplanar, multisequence MR imaging of the lumbar spine was performed. No intravenous contrast was administered.  COMPARISON:  Radiography 05/10/2019.  MRI 03/22/2018.  FINDINGS: Segmentation: 5 lumbar type vertebral bodies as numbered previously.  Alignment: 9 mm anterolisthesis at L4-5 as seen previously. 7 mm anterolisthesis L5-S1 as seen previously.  Vertebrae:  No fracture or primary bone lesion.  Conus medullaris and cauda  equina: Conus extends to the L1-2 level. Conus and cauda equina appear normal.  Paraspinal and other soft tissues: Negative  Disc levels:  No significant finding at L1-2 or above.  L2-3: Bulging of the disc. Mild facet and ligamentous hypertrophy. Mild stenosis of both lateral recesses but no visible neural compression.  L3-4: Bulging of the disc. Facet and ligamentous hypertrophy. Mild multifactorial stenosis but without visible neural compression.  L4-5: Chronic facet arthropathy with 9 mm of anterolisthesis. Chronic disc degeneration with loss of disc height. Narrowing of the lateral recesses and neural foramina that could cause neural compression on either or both sides. Foraminal narrowing is considerably worse on the right.  L5-S1: Chronic facet arthropathy with 7 mm of anterolisthesis. Chronic disc degeneration with loss of disc height. Central canal is sufficiently patent. Foraminal narrowing left worse than right. Left L5 nerve compression could occur.  No discernible change since the study of last year.  IMPRESSION: No change visible since last year. Degenerative anterolisthesis at L4-5 and L5-S1. At L4-5, there is bilateral lateral recess stenosis and right more than left foraminal stenosis that could cause neural compression, particularly affecting the right L4 nerve. At L5-S1, there is foraminal stenosis left worse than right that could compress the left L5 nerve.  Grossly non-compressive degenerative changes at L2-3 and L3-4 with disc bulges and facet hypertrophy. This findings could certainly relate to back pain. The appearance could worsen with standing or flexion.   Electronically Signed   By: Jessica Mullins M.D.   On: 05/29/2019 20:08  I, Jessica Mullins, personally (independently) visualized and performed the interpretation of the images attached in this note.     Assessment and Plan: 86 y.o. female with right lumbar radiculopathy.   Consistent with L4 dermatomal pattern.  Patient had excellent results with epidural steroid injection at L4 nerve root in December 2020.  Her symptoms are consistent with this problem again.  Plan for repeat epidural steroid injection.  Limited gabapentin as well for nerve pain.  Recheck as needed.  Also advised patient how to stop and start Eliquis.  Stop Eliquis 24 hours prior to injection and restart 96 hours after injection.  No other blood thinners present.   PDMP not reviewed this encounter. Orders Placed This Encounter  Procedures  . DG Epidural/Nerve Root    Standing Status:   Future    Standing Expiration Date:   12/15/2020    Order Specific Question:   Reason for Exam (SYMPTOM  OR DIAGNOSIS REQUIRED)    Answer:   right L4 nerve root and Epidural steroid injection    Order Specific Question:   Preferred imaging location?    Answer:   [id number]   Meds ordered this encounter  Medications  . gabapentin (NEURONTIN) 100 MG capsule    Sig: Take 1-3 capsules (100-300 mg total) by mouth 3 (three) times daily as needed (pain).    Dispense:  90 capsule    Refill:  3     Discussed warning signs or symptoms. Please see discharge instructions. Patient expresses understanding.   The above documentation has been reviewed and is accurate and complete Jessica Mullins, M.D.

## 2019-12-17 ENCOUNTER — Telehealth: Payer: Self-pay | Admitting: Family Medicine

## 2019-12-17 NOTE — Telephone Encounter (Signed)
Patient called because she is concerned about the side effects she has noticed after taking the Gabapentin that was prescribed. She said that she has noticed fatigue (which she expected), trouble with coordination, and a slight tremor.   She wanted to know if she should lower to dose or what would be best.  She took 3 last night and 3 again this morning.

## 2019-12-17 NOTE — Telephone Encounter (Signed)
Called patient and given MD response.  Pt expressed understanding and will reduce the dose.

## 2019-12-17 NOTE — Telephone Encounter (Signed)
Try reducing the dose to one or 2 pills at a time.  If this is not enough to control symptoms we will try Lyrica.  Let me know how you are feeling.

## 2019-12-18 MED ORDER — PREGABALIN 25 MG PO CAPS: 25.0000 mg | ORAL_CAPSULE | Freq: Two times a day (BID) | ORAL | 2 refills | Status: DC | PRN

## 2019-12-18 NOTE — Telephone Encounter (Signed)
Pt is struggling with the Gabapentin. She reduced the dosage to two at a time but the side effects seem to be more than she can handle and her pain is not controlled.

## 2019-12-18 NOTE — Telephone Encounter (Signed)
Called and left detailed message w/ info regarding switch from gabapentin to Lyrica and appropriate dosage instructions.

## 2019-12-18 NOTE — Addendum Note (Signed)
Addended by: Gregor Hams on: 12/18/2019 12:04 PM   Modules accepted: Orders

## 2019-12-18 NOTE — Telephone Encounter (Signed)
Stop gabapentin.  I have prescribed Lyrica at a very low dose.  Take 1 to 2 pills twice daily as needed for nerve pain.  Could increase the dose further from there if needed.

## 2019-12-20 ENCOUNTER — Other Ambulatory Visit: Payer: Self-pay | Admitting: Hematology and Oncology

## 2019-12-20 DIAGNOSIS — I4819 Other persistent atrial fibrillation: Secondary | ICD-10-CM

## 2019-12-23 ENCOUNTER — Other Ambulatory Visit: Payer: Self-pay | Admitting: Hematology and Oncology

## 2019-12-23 ENCOUNTER — Other Ambulatory Visit: Payer: Self-pay | Admitting: Family Medicine

## 2019-12-23 DIAGNOSIS — I4819 Other persistent atrial fibrillation: Secondary | ICD-10-CM

## 2019-12-24 ENCOUNTER — Other Ambulatory Visit: Payer: Self-pay

## 2019-12-24 ENCOUNTER — Ambulatory Visit
Admission: RE | Admit: 2019-12-24 | Discharge: 2019-12-24 | Disposition: A | Discharge status: Home / Self Care | Payer: Medicare PPO | Source: Ambulatory Visit | Attending: Family Medicine | Admitting: Family Medicine

## 2019-12-24 DIAGNOSIS — M5416 Radiculopathy, lumbar region: Secondary | ICD-10-CM

## 2019-12-24 MED ORDER — IOPAMIDOL (ISOVUE-M 200) INJECTION 41%
1.0000 mL | Freq: Once | INTRAMUSCULAR | Status: AC
  Administered 2019-12-24: 1 mL via EPIDURAL

## 2019-12-24 MED ORDER — METHYLPREDNISOLONE ACETATE 40 MG/ML INJ SUSP (RADIOLOG
120.0000 mg | Freq: Once | INTRAMUSCULAR | Status: AC
  Administered 2019-12-24: 120 mg via EPIDURAL

## 2019-12-24 NOTE — Discharge Instructions (Signed)
Spinal Injection Discharge Instruction Sheet  1. You may resume a regular diet and any medications that you routinely take, including pain medications.  2. No driving the rest of the day of the procedure.  3. Light activity throughout the rest of the day.  Do not do any strenuous work, exercise, bending or lifting.  The day following the procedure, you may resume normal physical activity but you should refrain from exercising or physical therapy for at least three days.   Common Side Effects:   Headaches- take your usual medications as directed by your physician.     Restlessness or inability to sleep- you may have trouble sleeping for the next few days.  Ask your referring physician if you need any medication for sleep if over the counter sleep medications do not help.   Facial flushing or redness- this should subside within a few days.   Increased pain- a temporary increase in pain a day or two following your procedure is not unusual.  Take your pain medication as prescribed by your referring physician.  You may use ice to the injection site as needed.  Please do not use heat for 24 hours.   Leg cramps  Please contact our office at [phone] for the following symptoms:  Fever greater than 100 degrees.  Headaches unresolved with medication after 2-3 days.  Increased swelling, pain, or redness at injection site.  Thank you for visiting our office.   You may resume Eliquis 24-48 hours after this procedure.

## 2020-01-08 ENCOUNTER — Encounter: Payer: Self-pay | Admitting: Internal Medicine

## 2020-01-08 ENCOUNTER — Non-Acute Institutional Stay: Payer: Medicare PPO | Admitting: Internal Medicine

## 2020-01-08 ENCOUNTER — Other Ambulatory Visit: Payer: Self-pay

## 2020-01-08 VITALS — BP 122/82 | HR 62 | Temp 97.1°F | Ht 61.0 in | Wt 153.4 lb

## 2020-01-08 DIAGNOSIS — M8580 Other specified disorders of bone density and structure, unspecified site: Secondary | ICD-10-CM | POA: Diagnosis not present

## 2020-01-08 DIAGNOSIS — E7800 Pure hypercholesterolemia, unspecified: Secondary | ICD-10-CM | POA: Diagnosis not present

## 2020-01-08 DIAGNOSIS — M79644 Pain in right finger(s): Secondary | ICD-10-CM

## 2020-01-08 DIAGNOSIS — Z85820 Personal history of malignant melanoma of skin: Secondary | ICD-10-CM

## 2020-01-08 DIAGNOSIS — I480 Paroxysmal atrial fibrillation: Secondary | ICD-10-CM

## 2020-01-08 DIAGNOSIS — I10 Essential (primary) hypertension: Secondary | ICD-10-CM

## 2020-01-08 DIAGNOSIS — Z853 Personal history of malignant neoplasm of breast: Secondary | ICD-10-CM | POA: Diagnosis not present

## 2020-01-08 DIAGNOSIS — M5416 Radiculopathy, lumbar region: Secondary | ICD-10-CM

## 2020-01-08 DIAGNOSIS — D329 Benign neoplasm of meninges, unspecified: Secondary | ICD-10-CM | POA: Diagnosis not present

## 2020-01-08 DIAGNOSIS — R2689 Other abnormalities of gait and mobility: Secondary | ICD-10-CM | POA: Diagnosis not present

## 2020-01-08 DIAGNOSIS — I951 Orthostatic hypotension: Secondary | ICD-10-CM | POA: Diagnosis not present

## 2020-01-08 DIAGNOSIS — H3530 Unspecified macular degeneration: Secondary | ICD-10-CM

## 2020-01-08 NOTE — Progress Notes (Signed)
Provider:  Rexene Edison. Mariea Clonts, D.O., C.M.D. Location:  Occupational psychologist of Service:  Clinic (12)   Previous PCP: Carollee Herter, Alferd Apa, DO Patient Care Team: Carollee Herter, Alferd Apa, DO as PCP - General Nicholas Lose, MD as Consulting Physician (Hematology and Oncology) Allyn Kenner, MD as Consulting Physician (Dermatology) Calvert Cantor, MD as Consulting Physician (Ophthalmology) Bensimhon, Shaune Pascal, MD as Consulting Physician (Cardiology) Pieter Partridge, DO as Consulting Physician (Neurology)  Extended Emergency Contact Information Primary Emergency Contact: Ray,Ruth  Johnnette Litter of [place] Phone: [phone] Relation: Daughter Secondary Emergency Contact: Eudelia Bunch States of [place] Phone: [phone] Relation: Son  Code Status: Reports DNR but not on file in epic/vynca or matrix Goals of Care: Advanced Directive information Advanced Directives 01/08/2020  Does Patient Have a Medical Advance Directive? Yes  Type of Paramedic of [place];Living will;Out of facility DNR (pink MOST or yellow form)  Does patient want to make changes to medical advance directive? No - Patient declined  Copy of [place] in Chart? No - copy requested  Would patient like information on creating a medical advance directive? -  Pre-existing out of facility DNR order (yellow form or pink MOST form) Pink MOST/Yellow Form most recent copy in chart - Physician notified to receive inpatient order  living will in [place]  Patient presents with  . Establish Care    New patient to establish care     HPI: Patient is a 86 y.o. female seen today to establish with [facility]  She previously saw Dr. Carollee Herter for many years.  She moved here to [place] three months ago.  She has a cat who moved with her.  She is in an apt in the [place] wing.   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  She's been traveling.  She was into hiking and kayaking.  Has been to all 7 continents.  That was great.    She's always had balance problems.  She told me a story about fearing falling and holding onto a small thing to help her.   Traveling has become riskier and she has been wanting to spend more time with a grandchild.    She was in good health until 2016 when she was diagnosed with breast cancer.  Had lumpectomy, chemo and radiation.  Her balance was worse after that.  After second chemo tx, went into the kitchen to make breakfast and she went down to the ground.  She got up and it happened again.  She called her oncologist who sent her to be tested by cardiology and was found to have atrial fib and put on flecainide.  She continued to have similar spells and would know she was going down.  She does not have neuropathy in her feet.  Had wrought iron furniture on her patio.  She was going to put it in her shed for the winter.  She decided to do this herself.  She did, but the next morning in 2020, she was in excruciating pain in her leg clear up into her lumbar region.  Saw Dr. Etter Sjogren.  Had xrays and went to sports medicine.  She had PT.  Helped for a while.  Pain remained.  Dec 2020, woke up with worst pain ever--got epidural.  Helped her clear up to about a month ago.  Had another episode just in January.  Meanwhile,  she was selling her house, under a lot of stress.  2 days before the showing, she went out to eat with a friend at [employer].  Then she went home that night (April).  9-10pm she threw up for hours, then diarrhea.  3-4am, she made a dash toward the bathroom.  She cracked her head against the wooden floor.  She called ems and had CT, MRI.  Told she had a meningioma at the base of her brainstem.  Saw a neurosurgeon, Dr. [name], at [facility] didn't hit it off.  She is to f/u in 6 mos from then.    Says she knows she's old, but it's her physical functioning that  should determine how she is treated.  Saturday she woke up and her right hand was swollen through her base of her thumb.  When she moved her thumb, it was very tender.  She put ice on it.  It felt weak.  It's still puffy.    She has placed on her right lateral thigh.    Balance problem persists.  Uses cane.    Had a basal cell skin cancer, also squamous cell before.  For her melanoma, she lived in [place] then.  Went to a Dr. Valere Dross at [facility].  Melanoma was on right medial lower leg and excised.    Her hysterectomy was for fibroid cysts.  She has had cataracts removed--she has had a lazy left eye.  She does have macular degeneration--very mild--Dr. Bing Plume is watching this--due in august.  She had been to [facility] for a check also.  She had osteopenia on 11/23/18 bone density.  She is on fosamax--has been on it for many years, ca with D.  Did take the boniva monthly at one point, but she had a bad reaction.  Was on fosamax some before that.    Past Medical History:  Diagnosis Date  . Allergy   . Ankle fracture, left   . Atrial fibrillation [facility])    Per [facility] Patient Packet   . Breast cancer ([facility]) 1115/16   left   . Bursitis of right shoulder   . Cataract   . Family history of cancer   . Fracture of right wrist   . GERD (gastroesophageal reflux disease)   . Hyperlipidemia   . Hypertension   . Lumbar radiculitis    Per [facility] New Patient Packet   . Meningioma [facility])    Per [facility] New Patient Packet   . Skin cancer 2000   melanoma and basal cell  . Stress fracture    Right Heel, Per [place] New Patient Packet   . Vaso vagal episode    during preparation for colonoscopy   Past Surgical History:  Procedure Laterality Date  . ABDOMINAL HYSTERECTOMY  1988   TAH/BSO--FIBROIDS  . APPENDECTOMY    . BREAST LUMPECTOMY     B/L--[facility]  . BREAST LUMPECTOMY WITH RADIOACTIVE SEED AND SENTINEL LYMPH NODE BIOPSY Left 06/11/2015   Procedure: LEFT BREAST LUMPECTOMY WITH RADIOACTIVE SEED AND  LEFT SENTINEL LYMPH NODE MAPPING;  Surgeon: Erroll Luna, MD;  Location: [place];  Service: General;  Laterality: Left;  . CATARACT EXTRACTION  2015  . COLONOSCOPY  2010  . DG  BONE DENSITY ([place] [facility])    . EYE SURGERY Bilateral    cataracts  . fiberadenoma Bilateral 1978, 1980  . GANGLION CYST EXCISION     L  hand  . Lipiflow procedure      Social History  Socioeconomic History  . Marital status: Widowed    Spouse name: Not on file  . Number of children: 2  . Years of education: Not on file  . Highest education level: Master's degree (e.g., MA, MS, MEng, MEd, [name], [name])  Occupational History  . Occupation: Product manager: RETIRED    Comment: retired  Tobacco Use  . Smoking status: Never Smoker  . Smokeless tobacco: Never Used  Vaping Use  . Vaping Use: Never used  Substance and Sexual Activity  . Alcohol use: No  . Drug use: No  . Sexual activity: Never  Other Topics Concern  . Not on file  Social History Narrative   Patient is widowed.  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    One son one daughter   1 caffeinated beverage daily   She is right-handed.    She works out everyday to a televised [employer] 30 minute program.   No tobacco or alcohol         Per [facility] New Patient Packet Abstracted on 01/03/2020      Diet: Left blank       Caffeine: Yes      Married, if yes what year: Widowed, married in [place] you live in a house, apartment, assisted living, condo, trailer, ect: Apartment      Is it one or more stories: One stories, one person       Pets: 1 Neurosurgeon      Current/Past profession: Pharmacist, hospital      Highest level or education completed: MA of Education       Exercise:   Yes             Type and how often: Cardio, chair aerobics, 3-4 times weekly          Living Will: Yes   DNR: Yes   POA/HPOA: Yes      Functional Status:   Do you have difficulty bathing or dressing yourself? Left blank   Do you have difficulty preparing food or  eating? Left blank   Do you have difficulty managing your medications? Left blank   Do you have difficulty managing your finances? Left blank   Do you have difficulty affording your medications? Left blank   Social Determinants of Health   Financial Resource Strain: Low Risk   . Difficulty of Paying Living Expenses: Not hard at all  Food Insecurity: No Food Insecurity  . Worried About Charity fundraiser in the Last Year: Never true  . Ran Out of Food in the Last Year: Never true  Transportation Needs: No Transportation Needs  . Lack of Transportation (Medical): No  . Lack of Transportation (Non-Medical): No  Physical Activity:   . Days of Exercise per Week:   . Minutes of Exercise per Session:   Stress:   . Feeling of Stress :   Social Connections:   . Frequency of Communication with Friends and Family:   . Frequency of Social Gatherings with Friends and Family:   . Attends Religious Services:   . Active Member of Clubs or Organizations:   . Attends Archivist Meetings:   [name] Marital Status:     reports that she has never smoked. She has never used smokeless tobacco. She reports that she does not drink alcohol and does not use drugs.  Functional Status Survey:    Family History  Problem Relation Age of Onset  .  Cancer Mother        mets to bone--? primary  . Coronary artery disease Brother        died of M! @ 28  . Diabetes Brother   . Hyperlipidemia Brother   . Hypertension Brother   . Heart disease Brother        chf  . Heart disease Father 8       MI  . Lung cancer Sister 74       former smoker  . Diabetes Maternal Aunt   . Lung cancer Other   . Thrombosis Other        thromboembolism clotting disorder--? father died of ? clot   . Hypertension Son   . High Cholesterol Son     Health Maintenance  Topic Date Due  . INFLUENZA VACCINE  01/26/2020  . MAMMOGRAM  05/15/2020  . TETANUS/TDAP  09/18/2021  . DEXA SCAN  Completed  . COVID-19 Vaccine   Completed  . PNA vac Low Risk Adult  Completed    Allergies  Allergen Reactions  . Sulfa Antibiotics Other (See Comments)    As a child, rigid as a stick and not responsive  . Tape Other (See Comments)    Blisters, Please use "paper" tape only for short periods of time  . Glucosamine Forte [Nutritional Supplements] Rash  . Latex Rash    Outpatient Encounter Medications as of 01/08/2020  Medication Sig  . alendronate (FOSAMAX) 70 MG tablet TAKE 1 TAB ONCE A WEEK, AT LEAST 30 MIN BEFORE 1ST FOOD.DO NOT LIE DOWN FOR 30 MIN AFTER TAKING.  [name] AMBULATORY NON FORMULARY MEDICATION Walker use daily as directed.  Disp 1 Leg pain M79.604  . Biotin (BIOTIN 5000) 5 MG CAPS Take 5 mg by mouth daily.   . Calcium Carbonate-Vitamin D (CALTRATE 600+D) 600-400 MG-UNIT per tablet Take 1 tablet by mouth daily.   . Cholecalciferol (VITAMIN D3) 1000 UNITS CAPS Take 1 capsule by mouth daily.    . cycloSPORINE (RESTASIS) 0.05 % ophthalmic emulsion PLACE 1 DROP INTO BOTH EYES TWO (TWO) TIMES DAILY.  [name] diltiazem (CARDIZEM CD) 180 MG 24 hr capsule TAKE 1 CAPSULE (180 MG TOTAL) BY MOUTH DAILY.  [name] ELIQUIS 5 MG TABS tablet TAKE 1 TABLET BY MOUTH TWICE DAILY.  . flecainide (TAMBOCOR) 50 MG tablet TAKE ONE TABLET BY MOUTH TWICE A DAY  . ketoconazole (NIZORAL) 2 % cream APPLY 1 APPLICATION TOPICALLY DAILY AS NEEDED FOR IRRITATION  . Multiple Vitamins-Minerals (PRESERVISION AREDS) TABS Take 2 tablets by mouth daily.  . Omega-3 Fatty Acids (FISH OIL PO) Take 1 tablet by mouth daily.   . pravastatin (PRAVACHOL) 40 MG tablet Take 1 tablet (40 mg total) by mouth daily.  . Psyllium (METAMUCIL PO) Take 15 mLs by mouth daily.   . Turmeric 500 MG CAPS Take 1 capsule by mouth daily.   . vitamin B-12 (CYANOCOBALAMIN) 1000 MCG tablet Take 1 tablet (1,000 mcg total) by mouth daily.  . [DISCONTINUED] pregabalin (LYRICA) 25 MG capsule Take 1-2 capsules (25-50 mg total) by mouth 2 (two) times daily as needed (nerve pain).   No  facility-administered encounter medications on file as of 01/08/2020.    Review of Systems  Constitutional: Negative for chills and fever.  HENT: Negative for hearing loss.        Feeling of fullness  Eyes:       Corrective lenses, dry eyes, macular degeneration  Respiratory: Negative for cough and shortness of breath.   Cardiovascular: Negative  for chest pain, palpitations and leg swelling.       Afib  Gastrointestinal: Positive for constipation.       Hemorrhoids  Genitourinary: Positive for urgency. Negative for dysuria.       Incontinence  Musculoskeletal: Positive for back pain, falls and joint pain.  Skin:       Skin discoloration, lumps  Neurological:       Balance problem, meningioma  Endo/Heme/Allergies: Bruises/bleeds easily.  Psychiatric/Behavioral: Negative for memory loss.    Vitals:   01/08/20 1406  BP: 122/82  Pulse: 62  Temp: (!) 97.1 F (36.2 C)  TempSrc: Temporal  SpO2: 97%  Weight: 153 lb 6.4 oz (69.6 kg)  Height: 5' 1"  (1.549 m)   Body mass index is 28.98 kg/m. Physical Exam Vitals reviewed.  Constitutional:      General: She is not in acute distress.    Appearance: Normal appearance. She is obese. She is not toxic-appearing.  HENT:     Head: Normocephalic and atraumatic.     Right Ear: External ear normal.     Left Ear: External ear normal.     Nose: Nose normal.     Mouth/Throat:     Pharynx: Oropharynx is clear. No oropharyngeal exudate.  Eyes:     General: No scleral icterus.    Extraocular Movements: Extraocular movements intact.     Conjunctiva/sclera: Conjunctivae normal.     Pupils: Pupils are equal, round, and reactive to light.  Cardiovascular:     Rate and Rhythm: Rhythm irregular.     Heart sounds: No murmur heard.   Pulmonary:     Effort: Pulmonary effort is normal.     Breath sounds: Normal breath sounds. No wheezing, rhonchi or rales.  Abdominal:     General: Bowel sounds are normal.     Palpations: Abdomen is soft.      Tenderness: There is no abdominal tenderness.  Musculoskeletal:        General: Normal range of motion.     Cervical back: Neck supple.     Right lower leg: No edema.     Left lower leg: No edema.  Lymphadenopathy:     Cervical: No cervical adenopathy.  Skin:    General: Skin is warm and dry.     Comments: Scar on left anterior shin from melanoma excision; hyperpigmention, venous insufficiency  Neurological:     General: No focal deficit present.     Mental Status: She is alert and oriented to person, place, and time.     Cranial Nerves: No cranial nerve deficit.     Sensory: No sensory deficit.     Motor: No weakness.     Coordination: Coordination normal.     Gait: Gait normal.     Deep Tendon Reflexes: Reflexes normal.  Psychiatric:        Mood and Affect: Mood normal.        Behavior: Behavior normal.        Thought Content: Thought content normal.     Labs reviewed: Basic Metabolic Panel: Recent Labs    04/05/19 1348 10/05/19 0808 11/19/19 1403  NA 141 141 141  K 4.1 3.9 3.9  CL 102 107 103  CO2 33* 24 32  GLUCOSE 98 132* 93  BUN 15 22 16   CREATININE 0.73 0.85 0.71  CALCIUM 10.9* 8.6* 10.1   Liver Function Tests: Recent Labs    04/05/19 1348 10/05/19 0808 11/19/19 1403  AST 18 23 16   ALT 19  20 16  ALKPHOS 56 49 67  BILITOT 0.6 0.9 0.5  PROT 6.4 5.6* 6.0  ALBUMIN 4.4 3.2* 4.2   Recent Labs    10/05/19 0808  LIPASE 28   No results for input(s): AMMONIA in the last 8760 hours. CBC: Recent Labs    10/05/19 0808 11/19/19 1403  WBC 11.2* 6.7  NEUTROABS 10.3* 4.7  HGB 15.2* 14.6  HCT 45.8 42.8  MCV 96.4 94.1  PLT 207 213.0   Cardiac Enzymes: No results for input(s): CKTOTAL, CKMB, CKMBINDEX, TROPONINI in the last 8760 hours. BNP: Invalid input(s): POCBNP No results found for: HGBA1C Lab Results  Component Value Date   TSH 3.490 11/07/2016   Lab Results  Component Value Date   VITAMINB12 235 02/01/2018   No results found for:  FOLATE No results found for: IRON, TIBC, FERRITIN  Imaging and Procedures noted on new patient packet: 2011 cscope Dr. Carlean Purl 2020 [facility] 2020 bone density [facility] 2021 CT, MRI Dr. [name] 2017 chemo and XRT Dr. Lindi Adie  Assessment/Plan 1. Pain of right thumb -Call back if right base of thumb pain and inflammation does not resolve -I wanted to check uric acid, ESR, CRP--actually suspect possible pseudogout, but she could not come for labs this week and it seems to be rapidly improving  2. Balance problem -seems etiology has never been fully determined, might be orthostatic hypotension  3. Senile osteopenia -has been on fosamax for a prolonged time and boniva at some point, as well -need to determine if she is eligible for an alternative with bone density being in osteopenia, not osteoporosis range -we opted to continue the fosamax until I could determine this  4. Paroxysmal atrial fibrillation (HCC) -cont diltiazem, flecainide and eliquis 34m po bid--has normal GFR  5. Orthostatic hypotension -counseled on hydration and slow positional changes  6. History of breast cancer -s/p left lumpectomy, chemo and radiation in 2016-2017 -oncologist is Dr. GLindi Adieand surgeon was Dr. CBrantley Stagefor that -not on hormone therapy  7. Pure hypercholesterolemia -LDL was at goal in May--no known CAD -cont pravachol and fish oil  8. History of melanoma -left shin, s/p excision, no mets -sees derm regularly  9. [place]noted on MRI 10/05/19:  Dural-based extra-axial mass compressing the right anterolateral medulla and craniocervical junction without parenchymal edema. Appearance is most consistent with a meningioma. -has f/u with neurosurgeon but was not a fan of his bedside manner  10. Essential hypertension -bp at goal with her diltiazem for afib  11. Right lumbar radiculitis -recurrent difficulty -encouraged chairfit 1 exercise, suggested balance program  also  Labs/tests ordered:  Cbc, cmp, flp F/u 6 mos, fasting labs before  Wrenly Lauritsen L. Maddyson Keil, D.O. [facility] [address] [place] [address]Cell Phone (Mon-Fri 8am-5pm):  [phone] Call:  [phone]& follow prompts after 5pm & weekends Office Phone:  [phone] Fax:  [phone]

## 2020-01-09 DIAGNOSIS — D1724 Benign lipomatous neoplasm of skin and subcutaneous tissue of left leg: Secondary | ICD-10-CM | POA: Diagnosis not present

## 2020-01-09 DIAGNOSIS — L918 Other hypertrophic disorders of the skin: Secondary | ICD-10-CM | POA: Diagnosis not present

## 2020-01-09 DIAGNOSIS — L304 Erythema intertrigo: Secondary | ICD-10-CM | POA: Diagnosis not present

## 2020-01-09 DIAGNOSIS — D485 Neoplasm of uncertain behavior of skin: Secondary | ICD-10-CM | POA: Diagnosis not present

## 2020-01-09 DIAGNOSIS — L905 Scar conditions and fibrosis of skin: Secondary | ICD-10-CM | POA: Diagnosis not present

## 2020-01-09 DIAGNOSIS — D1723 Benign lipomatous neoplasm of skin and subcutaneous tissue of right leg: Secondary | ICD-10-CM | POA: Diagnosis not present

## 2020-01-09 DIAGNOSIS — L821 Other seborrheic keratosis: Secondary | ICD-10-CM | POA: Diagnosis not present

## 2020-01-12 DIAGNOSIS — Z85820 Personal history of malignant melanoma of skin: Secondary | ICD-10-CM | POA: Insufficient documentation

## 2020-01-12 DIAGNOSIS — D329 Benign neoplasm of meninges, unspecified: Secondary | ICD-10-CM | POA: Insufficient documentation

## 2020-01-12 DIAGNOSIS — M5416 Radiculopathy, lumbar region: Secondary | ICD-10-CM | POA: Insufficient documentation

## 2020-01-12 DIAGNOSIS — Z853 Personal history of malignant neoplasm of breast: Secondary | ICD-10-CM | POA: Insufficient documentation

## 2020-01-12 DIAGNOSIS — M8580 Other specified disorders of bone density and structure, unspecified site: Secondary | ICD-10-CM | POA: Insufficient documentation

## 2020-02-24 ENCOUNTER — Other Ambulatory Visit: Payer: Self-pay | Admitting: Hematology and Oncology

## 2020-02-24 ENCOUNTER — Ambulatory Visit: Payer: Medicare PPO | Admitting: Family Medicine

## 2020-02-24 DIAGNOSIS — I4819 Other persistent atrial fibrillation: Secondary | ICD-10-CM

## 2020-02-26 ENCOUNTER — Other Ambulatory Visit: Payer: Self-pay

## 2020-02-26 ENCOUNTER — Encounter: Payer: Self-pay | Admitting: Family Medicine

## 2020-02-26 ENCOUNTER — Ambulatory Visit (INDEPENDENT_AMBULATORY_CARE_PROVIDER_SITE_OTHER): Payer: Medicare PPO

## 2020-02-26 ENCOUNTER — Ambulatory Visit: Payer: Medicare PPO | Admitting: Family Medicine

## 2020-02-26 VITALS — BP 132/92 | HR 58 | Ht 61.0 in | Wt 154.0 lb

## 2020-02-26 DIAGNOSIS — M1711 Unilateral primary osteoarthritis, right knee: Secondary | ICD-10-CM | POA: Diagnosis not present

## 2020-02-26 DIAGNOSIS — M7061 Trochanteric bursitis, right hip: Secondary | ICD-10-CM

## 2020-02-26 DIAGNOSIS — M5416 Radiculopathy, lumbar region: Secondary | ICD-10-CM

## 2020-02-26 DIAGNOSIS — R269 Unspecified abnormalities of gait and mobility: Secondary | ICD-10-CM | POA: Diagnosis not present

## 2020-02-26 DIAGNOSIS — M25561 Pain in right knee: Secondary | ICD-10-CM

## 2020-02-26 DIAGNOSIS — R2689 Other abnormalities of gait and mobility: Secondary | ICD-10-CM | POA: Diagnosis not present

## 2020-02-26 NOTE — Progress Notes (Signed)
Fontaine No, am serving as a scribe for Dr. Lynne Leader. This visit occurred during the SARS-CoV-2 public health emergency.  Safety protocols were in place, including screening questions prior to the visit, additional usage of staff PPE, and extensive cleaning of exam room while observing appropriate contact time as indicated for disinfecting solutions.   Jessica Mullins is a 86 y.o. female who presents to [place] [facility] today for f/u of low back pain and R leg pain.  She was last seen by Dr. Georgina Snell on 12/16/19 and was prescribed Gabapentin and referred for a 2nd R L4 nerve root block and epidural that she had on 12/24/19.  Since her last visit, pt reports injection did help for 2 months. Intermittent right knee pain over medial aspect. Pain is chronic in nature and is not improving. Also has back and hip pain at times. Knee pain seems to be more prevalent.   She has pain today is not the same as it was previously when it was thought to be lumbar radiculopathy.  She has pain is more prominent at the lateral hip and anterior medial knee.  Much less pain below the level of the knee.  Diagnostic testing: L-spine MRI- 05/29/19; R hip and L-spine XR- 05/10/19   Pertinent review of systems: No fevers or chills  Relevant historical information: History of breast cancer and melanoma. Lives at [place].   Exam:  BP (!) 132/92   Pulse (!) 58   Ht 5\' 1"  (1.549 m)   Wt 154 lb (69.9 kg)   SpO2 96%   BMI 29.10 kg/m  General: Well Developed, well nourished, and in no acute distress.   MSK: L-spine normal-appearing nontender midline.  Decreased lumbar motion. Negative right-sided straight leg raise test. Reflexes intact and equal bilateral lower extremities.  Right hip normal-appearing normal motion.  Tender palpation greater trochanter.  Hip abduction strength 3+/5.  External rotation strength 4/5.  Right knee: Normal-appearing Mildly tender palpation anterior  medial knee. Normal motion. Stable ligamentous exam. Intact strength.    Lab and Radiology Results  X-ray images right knee obtained today personally and independently reviewed Mild patellofemoral DJD.  X-ray surprisingly absent of significant arthritis. Await formal radiology review  Assessment and Plan: 86 y.o. female with multifactorial leg pain.  Majority of patient's pain today is the lateral hip to anterior hip which is most likely due to hip abductor tendinopathy.  She may still have a component of L4 radiculopathy not fully controlled by epidural steroid injection in [place].  Plan for physical therapy at [facility] to address her hip and leg pain.  Knee pain unclear etiology.  X-ray obtained today to the visit does not show significant arthritis which is a bit of a surprise.  Pain may be more tendinopathy related or may be even lumbar radicular related.  Again trial of physical therapy if not better consider further diagnostic testing or even knee injection or even repeat epidural steroid injection.  Recheck back in a month.   PDMP not reviewed this encounter. Orders Placed This Encounter  Procedures  . DG Knee AP/LAT W/[place] Right    Standing Status:   Future    Number of Occurrences:   1    Standing Expiration Date:   02/25/2021    Order Specific Question:   Reason for Exam (SYMPTOM  OR DIAGNOSIS REQUIRED)    Answer:   eval knee pain    Order Specific Question:   Preferred imaging location?  Answer:   [facility]    Order Specific Question:   Radiology Contrast Protocol - do NOT remove file path    Answer:   [email or web address]  . Ambulatory referral to Physical Therapy    Referral Priority:   Routine    Referral Type:   Physical Medicine    Referral Reason:   Specialty Services Required    Requested Specialty:   Physical Therapy    Number of Visits Requested:   1   No orders of the defined types were  placed in this encounter.    Discussed warning signs or symptoms. Please see discharge instructions. Patient expresses understanding.   The above documentation has been reviewed and is accurate and complete Lynne Leader, M.D. Total encounter time 30 minutes including face-to-face time with the patient and charting on the date of service.

## 2020-02-26 NOTE — Patient Instructions (Addendum)
Thank you for coming in today. Get xray today.  Plan for physical therapy for your hip and leg pain.  OK to use voltaren gel on the knee up to 4x daily. It is over the counter.  Recheck with me in about 1 month.  If not better I can do an injection in the hip or knee.  Return sooner or contact me sooner if needed.   You should be eligible for your 3rd covid vaccine (booster shot) 8 months after your last should which should be October 11.   Hip Bursitis  Hip bursitis is swelling of a fluid-filled sac (bursa) in your hip joint. This swelling (inflammation) can be painful. This condition may come and go over time. What are the causes?  Injury to the hip.  Overuse of the muscles that surround the hip joint.  An earlier injury or surgery of the hip.  Arthritis or gout.  Diabetes.  Thyroid disease.  Infection.  In some cases, the cause may not be known. What are the signs or symptoms?  Mild or moderate pain in the hip area. Pain may get worse with movement.  Tenderness and swelling of the hip, especially on the outer side of the hip.  In rare cases, the bursa may become infected. This may cause: ? A fever. ? Warmth and redness in the area. Symptoms may come and go. How is this treated? This condition is treated by resting, icing, applying pressure (compression), and raising (elevating) the injured area. You may hear this called the RICE treatment. Treatment may also include:  Using crutches.  Draining fluid out of the bursa to help relieve swelling.  Giving a shot of (injecting) medicine that helps to reduce swelling (cortisone).  Other medicines if the bursa is infected. Follow these instructions at home: Managing pain, stiffness, and swelling   If told, put ice on the painful area. ? Put ice in a plastic bag. ? Place a towel between your skin and the bag. ? Leave the ice on for 20 minutes, 2-3 times a day. ? Raise (elevate) your hip above the level of your  heart as much as you can without pain. To do this, try putting a pillow under your hips while you lie down. Stop if this causes pain. Activity  Return to your normal activities as told by your doctor. Ask your doctor what activities are safe for you.  Rest and protect your hip as much as you can until you feel better. General instructions  Take over-the-counter and prescription medicines only as told by your doctor.  Wear wraps that put pressure on your hip (compression wraps) only as told by your doctor.  Do not use your hip to support your body weight until your doctor says that you can.  Use crutches as told by your doctor.  Gently rub and stretch your injured area as often as is comfortable.  Keep all follow-up visits as told by your doctor. This is important. How is this prevented?  Exercise regularly, as told by your doctor.  Warm up and stretch before being active.  Cool down and stretch after being active.  Avoid activities that bother your hip or cause pain.  Avoid sitting down for long periods at a time. Contact a doctor if:  You have a fever.  You get new symptoms.  You have trouble walking.  You have trouble doing everyday activities.  You have pain that gets worse.  You have pain that does not get better  with medicine.  You get red skin on your hip area.  You get a feeling of warmth in your hip area. Get help right away if:  You cannot move your hip.  You have very bad pain. Summary  Hip bursitis is swelling of a fluid-filled sac (bursa) in your hip.  Hip bursitis can be painful.  Symptoms often come and go over time.  This condition is treated with rest, ice, compression, elevation, and medicines. This information is not intended to replace advice given to you by your health care provider. Make sure you discuss any questions you have with your health care provider. Document Revised: 02/19/2018 Document Reviewed: 02/19/2018 Elsevier Patient  Education  [place].

## 2020-02-27 ENCOUNTER — Ambulatory Visit (HOSPITAL_COMMUNITY)
Admission: RE | Admit: 2020-02-27 | Discharge: 2020-02-27 | Disposition: A | Discharge status: Home / Self Care | Payer: Medicare PPO | Source: Ambulatory Visit | Attending: Internal Medicine | Admitting: Internal Medicine

## 2020-02-27 ENCOUNTER — Ambulatory Visit: Payer: Medicare PPO | Admitting: Internal Medicine

## 2020-02-27 ENCOUNTER — Other Ambulatory Visit: Payer: Self-pay

## 2020-02-27 ENCOUNTER — Ambulatory Visit (INDEPENDENT_AMBULATORY_CARE_PROVIDER_SITE_OTHER): Payer: Medicare PPO | Admitting: Internal Medicine

## 2020-02-27 ENCOUNTER — Encounter: Payer: Self-pay | Admitting: Internal Medicine

## 2020-02-27 ENCOUNTER — Encounter (HOSPITAL_COMMUNITY): Payer: Self-pay | Admitting: Internal Medicine

## 2020-02-27 VITALS — BP 140/82 | HR 58 | Temp 97.5°F | Ht 61.0 in | Wt 154.4 lb

## 2020-02-27 VITALS — BP 136/82 | HR 55 | Wt 154.4 lb

## 2020-02-27 DIAGNOSIS — R5383 Other fatigue: Secondary | ICD-10-CM | POA: Diagnosis not present

## 2020-02-27 DIAGNOSIS — I10 Essential (primary) hypertension: Secondary | ICD-10-CM | POA: Diagnosis not present

## 2020-02-27 DIAGNOSIS — E785 Hyperlipidemia, unspecified: Secondary | ICD-10-CM | POA: Diagnosis not present

## 2020-02-27 DIAGNOSIS — Z23 Encounter for immunization: Secondary | ICD-10-CM | POA: Diagnosis not present

## 2020-02-27 DIAGNOSIS — Z888 Allergy status to other drugs, medicaments and biological substances status: Secondary | ICD-10-CM | POA: Diagnosis not present

## 2020-02-27 DIAGNOSIS — C50912 Malignant neoplasm of unspecified site of left female breast: Secondary | ICD-10-CM | POA: Insufficient documentation

## 2020-02-27 DIAGNOSIS — K5904 Chronic idiopathic constipation: Secondary | ICD-10-CM | POA: Diagnosis not present

## 2020-02-27 DIAGNOSIS — Z7901 Long term (current) use of anticoagulants: Secondary | ICD-10-CM | POA: Insufficient documentation

## 2020-02-27 DIAGNOSIS — W19XXXA Unspecified fall, initial encounter: Secondary | ICD-10-CM | POA: Insufficient documentation

## 2020-02-27 DIAGNOSIS — Z9989 Dependence on other enabling machines and devices: Secondary | ICD-10-CM | POA: Insufficient documentation

## 2020-02-27 DIAGNOSIS — R42 Dizziness and giddiness: Secondary | ICD-10-CM | POA: Diagnosis not present

## 2020-02-27 DIAGNOSIS — M5416 Radiculopathy, lumbar region: Secondary | ICD-10-CM

## 2020-02-27 DIAGNOSIS — M1711 Unilateral primary osteoarthritis, right knee: Secondary | ICD-10-CM | POA: Diagnosis not present

## 2020-02-27 DIAGNOSIS — I480 Paroxysmal atrial fibrillation: Secondary | ICD-10-CM | POA: Diagnosis not present

## 2020-02-27 DIAGNOSIS — Z8249 Family history of ischemic heart disease and other diseases of the circulatory system: Secondary | ICD-10-CM | POA: Diagnosis not present

## 2020-02-27 DIAGNOSIS — Z882 Allergy status to sulfonamides status: Secondary | ICD-10-CM | POA: Diagnosis not present

## 2020-02-27 DIAGNOSIS — R002 Palpitations: Secondary | ICD-10-CM | POA: Insufficient documentation

## 2020-02-27 DIAGNOSIS — M79604 Pain in right leg: Secondary | ICD-10-CM | POA: Insufficient documentation

## 2020-02-27 DIAGNOSIS — Z79899 Other long term (current) drug therapy: Secondary | ICD-10-CM | POA: Insufficient documentation

## 2020-02-27 MED ORDER — SENNOSIDES-DOCUSATE SODIUM 8.6-50 MG PO TABS: 1.0000 | ORAL_TABLET | ORAL | 3 refills | Status: DC

## 2020-02-27 NOTE — Patient Instructions (Signed)
Try senokot-s or colace every other day. We'll try [facility] with Thamas Jaegers before you see Dr. Lorin Mercy Keep me posted!

## 2020-02-27 NOTE — Progress Notes (Signed)
Zio patch placed onto patient.  All instructions and information reviewed with patient, they verbalize understanding with no questions. 

## 2020-02-27 NOTE — Addendum Note (Signed)
Encounter addended by: Malena Edman, RN on: 02/27/2020 2:03 PM  Actions taken: Clinical Note Signed

## 2020-02-27 NOTE — Patient Instructions (Signed)
Your provider has recommended that  you wear a Zio Patch for 14 days.  This monitor will record your heart rhythm for our review.  IF you have any symptoms while wearing the monitor please press the button.  If you have any issues with the patch or you notice a red or orange light on it please call the company at [phone].  Once you remove the patch please mail it back to the company as soon as possible so we can get the results.  Follow up appointments were recommended: tele-health 6 weeks,  You have been referred to the [facility]. They will contact you   If you have any questions or concerns before your next appointment please send [place] a message through [place] or call our office at [phone].    TO LEAVE A MESSAGE FOR THE NURSE SELECT OPTION 2, PLEASE LEAVE A MESSAGE INCLUDING: . YOUR NAME . DATE OF BIRTH . CALL BACK NUMBER . REASON FOR CALL**this is important as we prioritize the call backs  [place] AS LONG AS YOU CALL BEFORE 4:00 PM  At the [facility], you and your health needs are our priority. As part of our continuing mission to provide you with exceptional heart care, we have created designated Provider Care Teams. These Care Teams include your primary Cardiologist (physician) and Advanced Practice Providers (APPs- Physician Assistants and Nurse Practitioners) who all work together to provide you with the care you need, when you need it.   You may see any of the following providers on your designated Care Team at your next follow up: [name] Dr Glori Bickers . Dr Loralie Champagne . Darrick Grinder, NP . Lyda Jester, PA . Audry Riles, PharmD   Please be sure to bring in all your medications bottles to every appointment.

## 2020-02-27 NOTE — Progress Notes (Signed)
Patient ID: Jessica Mullins, female   DOB: May 18, 1931, 86 y.o.   MRN: [id number]  CARDIOLOGY CLINIC  NOTE  Referring Physician: Lindi Adie Primary Care: Etter Sjogren Primary Cardiologist: None  HPI:  Jessica Mullins is an 86 y.o woman with h/o HTN, HL, PAF and left breast CA.   Diagnosed with breast CA in 11/17. Underwent lumpectomy in 12/16. Had 2/4 rounds of chemo so far with taxotere and Cytoxan last dose Monday.  Found to have AF with RVR in 2017. Started on Eliquis. Echo normal.   We arranged for TEE/DC-CV on 08/14/15 but when she arrived for procedure she was in NSR. Given high risk of recurrence we started flecainide 50 bid. She had treadmill test to exclude proarrhythmia. Baseline ECG with NSR 73. QRS and PR intervlas normal. No arrhythmias. Nonspecific ST changes with exercise.   Returns for f/u. Now living at [facility] - since 10/10/19.  Says she is a mess. Had a fall and has been having leg pain. Multiple somatic complaints. Having occasional lightheaded spells. Denies frank palpitations. Saw nurse last week and SBP in 170s. On her cuff 120-130s for the most part. No bleeding with Eliquis.     Past Medical History:  Diagnosis Date  . Allergy   . Ankle fracture, left   . Atrial fibrillation [facility])    Per [place] Patient Packet   . Breast cancer ([place]) 1115/16   left   . Bursitis of right shoulder   . Cataract   . Family history of cancer   . Fracture of right wrist   . GERD (gastroesophageal reflux disease)   . Hyperlipidemia   . Hypertension   . Lumbar radiculitis    Per [facility] New Patient Packet   . Meningioma [facility])    Per [facility] New Patient Packet   . Skin cancer 2000   melanoma and basal cell  . Stress fracture    Right Heel, Per [place] New Patient Packet   . Vaso vagal episode    during preparation for colonoscopy    Current Outpatient Medications  Medication Sig Dispense Refill  . alendronate (FOSAMAX) 70 MG tablet TAKE 1 TAB ONCE A WEEK, AT LEAST 30 MIN BEFORE 1ST FOOD.DO NOT LIE DOWN  FOR 30 MIN AFTER TAKING. 4 tablet 2  . AMBULATORY NON FORMULARY MEDICATION Walker use daily as directed.  Disp 1 Leg pain M79.604 1 each 0  . Biotin (BIOTIN 5000) 5 MG CAPS Take 5 mg by mouth daily.     . Calcium Carbonate-Vitamin D (CALTRATE 600+D) 600-400 MG-UNIT per tablet Take 1 tablet by mouth daily.     . Cholecalciferol (VITAMIN D3) 1000 UNITS CAPS Take 1 capsule by mouth daily.      . cycloSPORINE (RESTASIS) 0.05 % ophthalmic emulsion PLACE 1 DROP INTO BOTH EYES TWO (TWO) TIMES DAILY.    [name] diltiazem (CARDIZEM CD) 180 MG 24 hr capsule TAKE 1 CAPSULE (180 MG TOTAL) BY MOUTH DAILY. 90 capsule 2  . ELIQUIS 5 MG TABS tablet TAKE 1 TABLET BY MOUTH TWICE DAILY. 60 tablet 0  . flecainide (TAMBOCOR) 50 MG tablet TAKE ONE TABLET BY MOUTH TWICE A DAY 60 tablet 10  . ketoconazole (NIZORAL) 2 % cream APPLY 1 APPLICATION TOPICALLY DAILY AS NEEDED FOR IRRITATION 15 g 1  . Multiple Vitamins-Minerals (PRESERVISION AREDS) TABS Take 2 tablets by mouth daily.    . Omega-3 Fatty Acids (FISH OIL PO) Take 1 tablet by mouth daily.     . pravastatin (PRAVACHOL) 40 MG tablet Take  1 tablet (40 mg total) by mouth daily. 90 tablet 1  . Psyllium (METAMUCIL PO) Take 15 mLs by mouth daily.     . Turmeric 500 MG CAPS Take 1 capsule by mouth daily.     . vitamin B-12 (CYANOCOBALAMIN) 1000 MCG tablet Take 1 tablet (1,000 mcg total) by mouth daily.     No current facility-administered medications for this encounter.    Allergies  Allergen Reactions  . Sulfa Antibiotics Other (See Comments)    As a child, rigid as a stick and not responsive  . Tape Other (See Comments)    Blisters, Please use "paper" tape only for short periods of time  . Glucosamine Forte [Nutritional Supplements] Rash  . Latex Rash      Social History   Socioeconomic History  . Marital status: Widowed    Spouse name: Not on file  . Number of children: 2  . Years of education: Not on file  . Highest education level: Master's degree  (e.g., MA, MS, MEng, MEd, [name], [name])  Occupational History  . Occupation: Product manager: RETIRED    Comment: retired  Tobacco Use  . Smoking status: Never Smoker  . Smokeless tobacco: Never Used  Vaping Use  . Vaping Use: Never used  Substance and Sexual Activity  . Alcohol use: No  . Drug use: No  . Sexual activity: Never  Other Topics Concern  . Not on file  Social History Narrative   Patient is widowed.  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    One son one daughter   1 caffeinated beverage daily   She is right-handed.    She works out everyday to a televised [employer] 30 minute program.   No tobacco or alcohol         Per [place] New Patient Packet Abstracted on 01/03/2020      Diet: Left blank       Caffeine: Yes      Married, if yes what year: Widowed, married in [place] you live in a house, apartment, assisted living, condo, trailer, ect: Apartment      Is it one or more stories: One stories, one person       Pets: 1 Neurosurgeon      Current/Past profession: Pharmacist, hospital      Highest level or education completed: MA of Education       Exercise:   Yes             Type and how often: Cardio, chair aerobics, 3-4 times weekly          Living Will: Yes   DNR: Yes   POA/HPOA: Yes      Functional Status:   Do you have difficulty bathing or dressing yourself? Left blank   Do you have difficulty preparing food or eating? Left blank   Do you have difficulty managing your medications? Left blank   Do you have difficulty managing your finances? Left blank   Do you have difficulty affording your medications? Left blank   Social Determinants of Health   Financial Resource Strain: Low Risk   . Difficulty of Paying Living Expenses: Not hard at all  Food Insecurity: No Food Insecurity  . Worried About Charity fundraiser in the Last Year: Never true  . Ran Out of Food in the Last Year: Never true  Transportation Needs: No Transportation Needs  . Lack of  Transportation (Medical): No  . Lack of Transportation (Non-Medical): No  Physical Activity:   . Days of Exercise per Week: Not on file  . Minutes of Exercise per Session: Not on file  Stress:   . Feeling of Stress : Not on file  Social Connections:   . Frequency of Communication with Friends and Family: Not on file  . Frequency of Social Gatherings with Friends and Family: Not on file  . Attends Religious Services: Not on file  . Active Member of Clubs or Organizations: Not on file  . Attends Archivist Meetings: Not on file  . Marital Status: Not on file  Intimate Partner Violence:   . Fear of Current or Ex-Partner: Not on file  . Emotionally Abused: Not on file  . Physically Abused: Not on file  . Sexually Abused: Not on file      Family History  Problem Relation Age of Onset  . Cancer Mother        mets to bone--? primary  . Coronary artery disease Brother        died of M! @ 41  . Diabetes Brother   . Hyperlipidemia Brother   . Hypertension Brother   . Heart disease Brother        chf  . Heart disease Father 44       MI  . Lung cancer Sister 25       former smoker  . Diabetes Maternal Aunt   . Lung cancer Other   . Thrombosis Other        thromboembolism clotting disorder--? father died of ? clot   . Hypertension Son   . High Cholesterol Son     Vitals:   02/27/20 1209  BP: 136/82  Pulse: (!) 55  SpO2: 95%  Weight: 70 kg (154 lb 6 oz)    PHYSICAL EXAM: General:  Well appearing. No resp difficulty HEENT: normal Neck: supple. no JVD. Carotids 2+ bilat; no bruits. No lymphadenopathy or thryomegaly appreciated. Cor: PMI nondisplaced. Regular brady No rubs, gallops or murmurs. Lungs: clear Abdomen: soft, nontender, nondistended. No hepatosplenomegaly. No bruits or masses. Good bowel sounds. Extremities: no cyanosis, clubbing, rash, edema Neuro: alert & orientedx3, cranial nerves grossly intact. moves all 4 extremities w/o difficulty. Affect  pleasant   ECG: sinus brady 54 bpm LVH. PR 156 QRS 72ms. Personally reviewed  ASSESSMENT & PLAN:  1. PAF with RVR     --CHADSVASC 4 (age (4), HTN, female)      --echo 2017 normal     --maintaining NSR on flecainide with only rare breakthrough.     --Having fatigue and mild palpitations. Unsure if it is related to breakthrough AF or bradycardia on diltizem or non-cardiac     --Place Zio to further evaluate     -- Refer back to [place] general cards  2. Breast CA     -- s/p mastectomy.  3. HTN    -- BP labile but relatively well controlled  Persais Ethridge,MD 1:50 PM

## 2020-02-27 NOTE — Progress Notes (Signed)
Mild knee arthritis present

## 2020-02-27 NOTE — Progress Notes (Signed)
Location:  [facility] clinic Provider: Daeron Carreno L. Mariea Clonts, D.O., C.M.D.  Goals of Care:  Advanced Directives 02/27/2020  Does Patient Have a Medical Advance Directive? Yes  Type of Paramedic of [place];Out of facility DNR (pink MOST or yellow form);Living will  Does patient want to make changes to medical advance directive? No - Patient declined  Copy of [place] in Chart? Yes - validated most recent copy scanned in chart (See row information)  Would patient like information on creating a medical advance directive? -  Pre-existing out of facility DNR order (yellow form or pink MOST form) Pink MOST/Yellow Form most recent copy in chart - Physician notified to receive inpatient order     Chief Complaint  Patient presents with  . Acute Visit    Stomach pain with bloating x months. Not feeling like she sleeps thru the night.   . Health Maintenance    influenza today     HPI: Patient is a 86 y.o. female seen today for an acute visit for stomach pain and bloating for months.  Says she is constipated--goes small amounts, then she'll be normal a few days, then a problem again.  Takes metamucil and miralax, eats a lot of vegetables.  Tried colace a long time ago, but not lately.  When she strains, it hurts her back.    She saw Dr. Haroldine Laws due to her lightheadedness for fear she was going into afib.  She's wearing a heart monitor for 14 days.    Falls asleep fine, but feels like she never slept afterward.  Feels hazy for a few hrs even into lunch at times.  Feels better later in the day.  Doesn't have anyone to ask if she snores.    Wants a second opinion about her problem with her knee/hip--with Dr. Lorin Mercy.  She had the epidural in dec with Dr. Georgina Snell, then in end of [place], early July, she had another.  She went to him yesterday and he ordered PT for her at [facility].  Her right knee had arthritis in it--putting voltaren gel on there and that's calmed it  down.  She's very weak in her right leg.    Past Medical History:  Diagnosis Date  . Allergy   . Ankle fracture, left   . Atrial fibrillation [facility])    Per [place] Patient Packet   . Breast cancer ([place]) 1115/16   left   . Bursitis of right shoulder   . Cataract   . Family history of cancer   . Fracture of right wrist   . GERD (gastroesophageal reflux disease)   . Hyperlipidemia   . Hypertension   . Lumbar radiculitis    Per [facility] New Patient Packet   . Meningioma [facility])    Per [facility] New Patient Packet   . Skin cancer 2000   melanoma and basal cell  . Stress fracture    Right Heel, Per [place] New Patient Packet   . Vaso vagal episode    during preparation for colonoscopy    Past Surgical History:  Procedure Laterality Date  . ABDOMINAL HYSTERECTOMY  1988   TAH/BSO--FIBROIDS  . APPENDECTOMY    . BREAST LUMPECTOMY     B/L--FCS  . BREAST LUMPECTOMY WITH RADIOACTIVE SEED AND SENTINEL LYMPH NODE BIOPSY Left 06/11/2015   Procedure: LEFT BREAST LUMPECTOMY WITH RADIOACTIVE SEED AND LEFT SENTINEL LYMPH NODE MAPPING;  Surgeon: Erroll Luna, MD;  Location: [place];  Service: General;  Laterality: Left;  .  CATARACT EXTRACTION  2015  . COLONOSCOPY  2010  . DG  BONE DENSITY ([place] [facility])    . EYE SURGERY Bilateral    cataracts  . fiberadenoma Bilateral 1978, 1980  . GANGLION CYST EXCISION     L  hand  . Lipiflow procedure      Allergies  Allergen Reactions  . Sulfa Antibiotics Other (See Comments)    As a child, rigid as a stick and not responsive  . Tape Other (See Comments)    Blisters, Please use "paper" tape only for short periods of time  . Glucosamine Forte [Nutritional Supplements] Rash  . Latex Rash    Outpatient Encounter Medications as of 02/27/2020  Medication Sig  . alendronate (FOSAMAX) 70 MG tablet TAKE 1 TAB ONCE A WEEK, AT LEAST 30 MIN BEFORE 1ST FOOD.DO NOT LIE DOWN FOR 30 MIN AFTER TAKING.  [name] AMBULATORY NON FORMULARY MEDICATION Walker use daily as directed.  Disp 1  Leg pain M79.604  . Biotin (BIOTIN 5000) 5 MG CAPS Take 5 mg by mouth daily.   . Calcium Carbonate-Vitamin D (CALTRATE 600+D) 600-400 MG-UNIT per tablet Take 1 tablet by mouth daily.   . Cholecalciferol (VITAMIN D3) 1000 UNITS CAPS Take 1 capsule by mouth daily.    . cycloSPORINE (RESTASIS) 0.05 % ophthalmic emulsion PLACE 1 DROP INTO BOTH EYES TWO (TWO) TIMES DAILY.  [name] diltiazem (CARDIZEM CD) 180 MG 24 hr capsule TAKE 1 CAPSULE (180 MG TOTAL) BY MOUTH DAILY.  [name] ELIQUIS 5 MG TABS tablet TAKE 1 TABLET BY MOUTH TWICE DAILY.  . flecainide (TAMBOCOR) 50 MG tablet TAKE ONE TABLET BY MOUTH TWICE A DAY  . ketoconazole (NIZORAL) 2 % cream APPLY 1 APPLICATION TOPICALLY DAILY AS NEEDED FOR IRRITATION  . Multiple Vitamins-Minerals (PRESERVISION AREDS) TABS Take 2 tablets by mouth daily.  . Omega-3 Fatty Acids (FISH OIL PO) Take 1 tablet by mouth daily.   . polyethylene glycol (MIRALAX / GLYCOLAX) 17 g packet Take 17 g by mouth daily.  . pravastatin (PRAVACHOL) 40 MG tablet Take 1 tablet (40 mg total) by mouth daily.  . Psyllium (METAMUCIL PO) Take 15 mLs by mouth daily.   . Turmeric 500 MG CAPS Take 1 capsule by mouth daily.   . vitamin B-12 (CYANOCOBALAMIN) 1000 MCG tablet Take 1 tablet (1,000 mcg total) by mouth daily.   No facility-administered encounter medications on file as of 02/27/2020.    Review of Systems:  Review of Systems  Constitutional: Negative for chills and fever.  HENT: Positive for hearing loss.   Eyes: Negative for blurred vision.  Respiratory: Negative for shortness of breath.   Cardiovascular: Positive for chest pain. Negative for palpitations and leg swelling.  Gastrointestinal: Positive for constipation. Negative for abdominal pain, blood in stool, diarrhea and melena.  Genitourinary: Negative for dysuria.  Musculoskeletal: Negative for joint pain and myalgias.  Skin: Negative for itching and rash.    Health Maintenance  Topic Date Due  . INFLUENZA VACCINE  01/26/2020   . MAMMOGRAM  05/15/2020  . TETANUS/TDAP  09/18/2021  . DEXA SCAN  Completed  . COVID-19 Vaccine  Completed  . PNA vac Low Risk Adult  Completed    Physical Exam: Vitals:   02/27/20 1413  BP: 140/82  Pulse: (!) 58  Temp: (!) 97.5 F (36.4 C)  TempSrc: Temporal  SpO2: 97%  Weight: 154 lb 7.2 oz (70.1 kg)  Height: 5\' 1"  (1.549 m)   Body mass index is 29.18 kg/m. Physical Exam Vitals reviewed.  Constitutional:      General: She is not in acute distress.    Appearance: Normal appearance. She is not toxic-appearing.  HENT:     Head: Normocephalic and atraumatic.  Cardiovascular:     Rate and Rhythm: Normal rate and regular rhythm.  Pulmonary:     Effort: Pulmonary effort is normal.     Breath sounds: Normal breath sounds. No wheezing, rhonchi or rales.  Abdominal:     General: Bowel sounds are normal.     Palpations: Abdomen is soft.     Tenderness: There is no abdominal tenderness.  Musculoskeletal:        General: Normal range of motion.     Right lower leg: No edema.     Left lower leg: No edema.     Comments: Right buttocks and sacroiliac tenderness  Skin:    General: Skin is warm and dry.  Neurological:     General: No focal deficit present.     Mental Status: She is alert and oriented to person, place, and time.  Psychiatric:        Mood and Affect: Mood normal.        Behavior: Behavior normal.     Labs reviewed: Basic Metabolic Panel: Recent Labs    04/05/19 1348 10/05/19 0808 11/19/19 1403  NA 141 141 141  K 4.1 3.9 3.9  CL 102 107 103  CO2 33* 24 32  GLUCOSE 98 132* 93  BUN 15 22 16   CREATININE 0.73 0.85 0.71  CALCIUM 10.9* 8.6* 10.1   Liver Function Tests: Recent Labs    04/05/19 1348 10/05/19 0808 11/19/19 1403  AST 18 23 16   ALT 19 20 16   ALKPHOS 56 49 67  BILITOT 0.6 0.9 0.5  PROT 6.4 5.6* 6.0  ALBUMIN 4.4 3.2* 4.2   Recent Labs    10/05/19 0808  LIPASE 28   No results for input(s): AMMONIA in the last 8760 hours. CBC:  Recent Labs    10/05/19 0808 11/19/19 1403  WBC 11.2* 6.7  NEUTROABS 10.3* 4.7  HGB 15.2* 14.6  HCT 45.8 42.8  MCV 96.4 94.1  PLT 207 213.0   Lipid Panel: Recent Labs    04/05/19 1348 11/19/19 1403  CHOL 143 155  HDL 63.30 61.40  LDLCALC 61 76  TRIG 97.0 89.0  CHOLHDL 2 3   No results found for: HGBA1C  Procedures since last visit: DG Knee AP/LAT W/Sun[place] Right  Result Date: 02/26/2020 CLINICAL DATA:  Chronic knee pain EXAM: RIGHT KNEE 3 VIEWS COMPARISON:  None. FINDINGS: No acute bony abnormality. Specifically, no fracture, subluxation, or dislocation. Trace suprapatellar effusion and thickening in Hoffa's fat pad. Background mild tricompartmental degenerative change. IMPRESSION: 1. Trace suprapatellar effusion and thickening in Hoffa's fat pad. No acute osseous abnormality. 2. Background mild tricompartmental degenerative change. Electronically Signed   By: Lovena Le M.D.   On: 02/26/2020 22:50    Assessment/Plan 1. Right lumbar radiculitis -returned 2 mos after epidural -agree with PT at [facility]--referral written out and sent to [facility] there  2. Localized osteoarthritis of right knee -ongoing, cont tylenol, voltaren gel  3. Paroxysmal atrial fibrillation (HCC) -wearing monitor to determine if having bouts   4. Chronic idiopathic constipation -continue miralax and metamucil, add senokot s or colace qod  5. Need for influenza vaccination - Flu Vaccine QUAD High Dose(Fluad)  Next appt:  07/15/2020--keep regular appt  Madyson Lukach L. Kyrsten Deleeuw, D.O. [facility] [address]  [place], [place] [address] Cell Phone (Mon-Fri 8am-5pm):  [phone] On Call:  [phone] & follow prompts after 5pm & weekends Office Phone:  [phone] Office Fax:  [phone]

## 2020-03-09 ENCOUNTER — Encounter (HOSPITAL_COMMUNITY): Payer: Self-pay | Admitting: Nurse Practitioner

## 2020-03-09 ENCOUNTER — Other Ambulatory Visit: Payer: Self-pay

## 2020-03-09 ENCOUNTER — Ambulatory Visit (HOSPITAL_COMMUNITY)
Admission: RE | Admit: 2020-03-09 | Discharge: 2020-03-09 | Disposition: A | Discharge status: Home / Self Care | Payer: Medicare PPO | Source: Ambulatory Visit | Attending: Nurse Practitioner | Admitting: Nurse Practitioner

## 2020-03-09 VITALS — BP 158/80 | HR 53 | Ht 61.0 in | Wt 153.0 lb

## 2020-03-09 DIAGNOSIS — Z882 Allergy status to sulfonamides status: Secondary | ICD-10-CM | POA: Diagnosis not present

## 2020-03-09 DIAGNOSIS — Z853 Personal history of malignant neoplasm of breast: Secondary | ICD-10-CM | POA: Insufficient documentation

## 2020-03-09 DIAGNOSIS — E785 Hyperlipidemia, unspecified: Secondary | ICD-10-CM | POA: Insufficient documentation

## 2020-03-09 DIAGNOSIS — Z8249 Family history of ischemic heart disease and other diseases of the circulatory system: Secondary | ICD-10-CM | POA: Diagnosis not present

## 2020-03-09 DIAGNOSIS — Z85820 Personal history of malignant melanoma of skin: Secondary | ICD-10-CM | POA: Diagnosis not present

## 2020-03-09 DIAGNOSIS — I10 Essential (primary) hypertension: Secondary | ICD-10-CM | POA: Diagnosis not present

## 2020-03-09 DIAGNOSIS — I480 Paroxysmal atrial fibrillation: Secondary | ICD-10-CM | POA: Diagnosis not present

## 2020-03-09 DIAGNOSIS — Z7901 Long term (current) use of anticoagulants: Secondary | ICD-10-CM | POA: Diagnosis not present

## 2020-03-09 DIAGNOSIS — Z888 Allergy status to other drugs, medicaments and biological substances status: Secondary | ICD-10-CM | POA: Diagnosis not present

## 2020-03-09 DIAGNOSIS — Z79899 Other long term (current) drug therapy: Secondary | ICD-10-CM | POA: Insufficient documentation

## 2020-03-09 DIAGNOSIS — R42 Dizziness and giddiness: Secondary | ICD-10-CM | POA: Insufficient documentation

## 2020-03-09 NOTE — Progress Notes (Addendum)
Primary Care Physician: Gayland Curry, DO Referring Physician: Dr. Haroldine Laws    Jessica Mullins is a 86 y.o. female with a h/o paroxysmal afib that has been on flecainide and CCB for some time. She had recently seen Dr. Haroldine Laws and c/o of intermittent spells of feeling off balance. SHe has not had any falls. Walks with a cane. She  was in rhythm for that appointment but he questioned if she could be having bouts of intermittent afib contributing to symptoms. . She states that she is never aware of palpitations. Has also noted some of her BP's have been running higher. Her heart rate at home by her monitor is usually in  the 60's.   Dr. Haroldine Laws placed a 14 day Zio patch on her and referred her here and to general cardiology. Unfortunately, she is still wearing the zio patch for a few more days, so I have no data to review as to status of afib burden. She is in SR today. She states that her weight has been fairly stable. She describes her lightheadedness more so with change of position. She is on eliquis 5 mg bid for a CHA2DS2VASc score of 3.  Today, she denies symptoms of palpitations, chest pain, shortness of breath, orthopnea, PND, lower extremity edema, dizziness, presyncope, syncope, or neurologic sequela. The patient is tolerating medications without difficulties and is otherwise without complaint today.   Past Medical History:  Diagnosis Date  . Allergy   . Ankle fracture, left   . Atrial fibrillation [facility])    Per [place] Patient Packet   . Breast cancer ([place]) 1115/16   left   . Bursitis of right shoulder   . Cataract   . Family history of cancer   . Fracture of right wrist   . GERD (gastroesophageal reflux disease)   . Hyperlipidemia   . Hypertension   . Lumbar radiculitis    Per [facility] New Patient Packet   . Meningioma [facility])    Per [facility] New Patient Packet   . Skin cancer 2000   melanoma and basal cell  . Stress fracture    Right Heel, Per [place] New Patient Packet   .  Vaso vagal episode    during preparation for colonoscopy   Past Surgical History:  Procedure Laterality Date  . ABDOMINAL HYSTERECTOMY  1988   TAH/BSO--FIBROIDS  . APPENDECTOMY    . BREAST LUMPECTOMY     B/L--FCS  . BREAST LUMPECTOMY WITH RADIOACTIVE SEED AND SENTINEL LYMPH NODE BIOPSY Left 06/11/2015   Procedure: LEFT BREAST LUMPECTOMY WITH RADIOACTIVE SEED AND LEFT SENTINEL LYMPH NODE MAPPING;  Surgeon: Erroll Luna, MD;  Location: [facility];  Service: General;  Laterality: Left;  . CATARACT EXTRACTION  2015  . COLONOSCOPY  2010  . DG  BONE DENSITY ([place] HX)    . EYE SURGERY Bilateral    cataracts  . fiberadenoma Bilateral 1978, 1980  . GANGLION CYST EXCISION     L  hand  . Lipiflow procedure      Current Outpatient Medications  Medication Sig Dispense Refill  . alendronate (FOSAMAX) 70 MG tablet TAKE 1 TAB ONCE A WEEK, AT LEAST 30 MIN BEFORE 1ST FOOD.DO NOT LIE DOWN FOR 30 MIN AFTER TAKING. 4 tablet 2  . AMBULATORY NON FORMULARY MEDICATION Walker use daily as directed.  Disp 1 Leg pain M79.604 1 each 0  . Biotin (BIOTIN 5000) 5 MG CAPS Take 5 mg by mouth daily.     . Calcium Carbonate-Vitamin  D (CALTRATE 600+D) 600-400 MG-UNIT per tablet Take 1 tablet by mouth daily.     . Cholecalciferol (VITAMIN D3) 1000 UNITS CAPS Take 1 capsule by mouth daily.      . cycloSPORINE (RESTASIS) 0.05 % ophthalmic emulsion PLACE 1 DROP INTO BOTH EYES TWO (TWO) TIMES DAILY.    [name] diltiazem (CARDIZEM CD) 180 MG 24 hr capsule TAKE 1 CAPSULE (180 MG TOTAL) BY MOUTH DAILY. 90 capsule 2  . ELIQUIS 5 MG TABS tablet TAKE 1 TABLET BY MOUTH TWICE DAILY. 60 tablet 0  . flecainide (TAMBOCOR) 50 MG tablet TAKE ONE TABLET BY MOUTH TWICE A DAY 60 tablet 10  . ketoconazole (NIZORAL) 2 % cream APPLY 1 APPLICATION TOPICALLY DAILY AS NEEDED FOR IRRITATION 15 g 1  . Multiple Vitamins-Minerals (PRESERVISION AREDS) TABS Take 2 tablets by mouth daily.    . Omega-3 Fatty Acids (FISH OIL PO) Take 1 tablet by mouth daily.      . polyethylene glycol (MIRALAX / GLYCOLAX) 17 g packet Take 17 g by mouth daily.    . pravastatin (PRAVACHOL) 40 MG tablet Take 1 tablet (40 mg total) by mouth daily. 90 tablet 1  . Psyllium (METAMUCIL PO) Take 15 mLs by mouth daily.     . Turmeric 500 MG CAPS Take 1 capsule by mouth daily.     . vitamin B-12 (CYANOCOBALAMIN) 1000 MCG tablet Take 1 tablet (1,000 mcg total) by mouth daily.    [name] senna-docusate (SENOKOT S) 8.6-50 MG tablet Take 1 tablet by mouth every other day. (Patient not taking: Reported on 03/09/2020) 15 tablet 3   No current facility-administered medications for this encounter.    Allergies  Allergen Reactions  . Sulfa Antibiotics Other (See Comments)    As a child, rigid as a stick and not responsive  . Tape Other (See Comments)    Blisters, Please use "paper" tape only for short periods of time  . Glucosamine Forte [Nutritional Supplements] Rash  . Latex Rash    Social History   Socioeconomic History  . Marital status: Widowed    Spouse name: Not on file  . Number of children: 2  . Years of education: Not on file  . Highest education level: Master's degree (e.g., MA, MS, MEng, MEd, [name], [name])  Occupational History  . Occupation: Product manager: RETIRED    Comment: retired  Tobacco Use  . Smoking status: Never Smoker  . Smokeless tobacco: Never Used  Vaping Use  . Vaping Use: Never used  Substance and Sexual Activity  . Alcohol use: No  . Drug use: No  . Sexual activity: Never  Other Topics Concern  . Not on file  Social History Narrative   Patient is widowed.  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    One son one daughter   1 caffeinated beverage daily   She is right-handed.    She works out everyday to a televised [employer] 30 minute program.   No tobacco or alcohol         Per [place] New Patient Packet Abstracted on 01/03/2020      Diet: Left blank       Caffeine: Yes      Married, if yes what year: Widowed, married in [place] you live in a house, apartment, assisted living, condo, trailer, ect: Apartment      Is it one or more stories: One stories, one person  Pets: 1 cat      Current/Past profession: Teacher      Highest level or education completed: MA of Education       Exercise:   Yes             Type and how often: Cardio, chair aerobics, 3-4 times weekly          Living Will: Yes   DNR: Yes   POA/HPOA: Yes      Functional Status:   Do you have difficulty bathing or dressing yourself? Left blank   Do you have difficulty preparing food or eating? Left blank   Do you have difficulty managing your medications? Left blank   Do you have difficulty managing your finances? Left blank   Do you have difficulty affording your medications? Left blank   Social Determinants of Health   Financial Resource Strain: Low Risk   . Difficulty of Paying Living Expenses: Not hard at all  Food Insecurity: No Food Insecurity  . Worried About Charity fundraiser in the Last Year: Never true  . Ran Out of Food in the Last Year: Never true  Transportation Needs: No Transportation Needs  . Lack of Transportation (Medical): No  . Lack of Transportation (Non-Medical): No  Physical Activity:   . Days of Exercise per Week: Not on file  . Minutes of Exercise per Session: Not on file  Stress:   . Feeling of Stress : Not on file  Social Connections:   . Frequency of Communication with Friends and Family: Not on file  . Frequency of Social Gatherings with Friends and Family: Not on file  . Attends Religious Services: Not on file  . Active Member of Clubs or Organizations: Not on file  . Attends Archivist Meetings: Not on file  . Marital Status: Not on file  Intimate Partner Violence:   . Fear of Current or Ex-Partner: Not on file  . Emotionally Abused: Not on file  . Physically Abused: Not on file  . Sexually Abused: Not on file    Family History  Problem Relation Age of Onset  . Cancer  Mother        mets to bone--? primary  . Coronary artery disease Brother        died of M! @ 48  . Diabetes Brother   . Hyperlipidemia Brother   . Hypertension Brother   . Heart disease Brother        chf  . Heart disease Father 9       MI  . Lung cancer Sister 62       former smoker  . Diabetes Maternal Aunt   . Lung cancer Other   . Thrombosis Other        thromboembolism clotting disorder--? father died of ? clot   . Hypertension Son   . High Cholesterol Son     ROS- All systems are reviewed and negative except as per the HPI above  Physical Exam: Vitals:   03/09/20 1103  BP: (!) 158/80  Pulse: (!) 53  Weight: 69.4 kg  Height: 5\' 1"  (1.549 m)   Wt Readings from Last 3 Encounters:  03/09/20 69.4 kg  02/27/20 70.1 kg  02/27/20 70 kg    Labs: Lab Results  Component Value Date   NA 141 11/19/2019   K 3.9 11/19/2019   CL 103 11/19/2019   CO2 32 11/19/2019   GLUCOSE 93 11/19/2019   BUN 16 11/19/2019  CREATININE 0.71 11/19/2019   CALCIUM 10.1 11/19/2019   MG 2.2 08/04/2015   Lab Results  Component Value Date   INR 1.20 10/21/2015   Lab Results  Component Value Date   CHOL 155 11/19/2019   HDL 61.40 11/19/2019   LDLCALC 76 11/19/2019   TRIG 89.0 11/19/2019     GEN- The patient is well appearing, alert and oriented x 3 today.   Head- normocephalic, atraumatic Eyes-  Sclera clear, conjunctiva pink Ears- hearing intact Oropharynx- clear Neck- supple, no JVP Lymph- no cervical lymphadenopathy Lungs- Clear to ausculation bilaterally, normal work of breathing Heart- Regular rate and rhythm, no murmurs, rubs or gallops, PMI not laterally displaced GI- soft, NT, ND, + BS Extremities- no clubbing, cyanosis, or edema MS- no significant deformity or atrophy Skin- no rash or lesion Psych- euthymic mood, full affect Neuro- strength and sensation are intact  EKG-Sinus brady at 53 bpm, pr int 168 ms, qrs int 86 ms, qtc 350 ms  EPic records reviewed Zio  patch placed 9/2 thru 916    Assessment and Plan: 1. Intermittent lightheadedness Pt denies bouts of palpitations  Sounds to be more to be associated with change of position  In SR today and on previously visit with PCP and Dr. Haroldine Laws  Wearing a zio patch to see if arrhythmia may be contributing, has a few days left to wear   Still has a few days left on monitor Continue flecainide 50 mg bid  Continue diltiazem 180 mg daily  Flecainide level today   2. HTN Mildly elevated today  Has noted some swings in BP at home  Pulse has been consistent at home in the 60's  3. CHA2DS2VASc score of 3 Continue  eliquis 5 mg bid, appropriately dosed  Pt will be seeing Dr. Sallyanne Kuster  as a  new pt on 9/30 The Zio patch results should be in about that time for review  On  review of the moniotr, if there  is arrhythmia  that requires  my input,  I will be glad to schedule the pt back here to be seen  Butch Penny C. Tenleigh Byer, [facility] [address] [place], [place] [address] [phone]

## 2020-03-10 LAB — FLECAINIDE LEVEL: Flecainide: 0.34 ug/mL (ref 0.20–1.00)

## 2020-03-10 NOTE — Progress Notes (Signed)
Got it.

## 2020-03-18 DIAGNOSIS — R2689 Other abnormalities of gait and mobility: Secondary | ICD-10-CM | POA: Diagnosis not present

## 2020-03-18 DIAGNOSIS — M62561 Muscle wasting and atrophy, not elsewhere classified, right lower leg: Secondary | ICD-10-CM | POA: Diagnosis not present

## 2020-03-18 DIAGNOSIS — M25561 Pain in right knee: Secondary | ICD-10-CM | POA: Diagnosis not present

## 2020-03-18 DIAGNOSIS — R278 Other lack of coordination: Secondary | ICD-10-CM | POA: Diagnosis not present

## 2020-03-18 DIAGNOSIS — M7061 Trochanteric bursitis, right hip: Secondary | ICD-10-CM | POA: Diagnosis not present

## 2020-03-18 DIAGNOSIS — M5416 Radiculopathy, lumbar region: Secondary | ICD-10-CM | POA: Diagnosis not present

## 2020-03-22 DIAGNOSIS — I4819 Other persistent atrial fibrillation: Secondary | ICD-10-CM | POA: Diagnosis not present

## 2020-03-24 DIAGNOSIS — R278 Other lack of coordination: Secondary | ICD-10-CM | POA: Diagnosis not present

## 2020-03-24 DIAGNOSIS — R2689 Other abnormalities of gait and mobility: Secondary | ICD-10-CM | POA: Diagnosis not present

## 2020-03-24 DIAGNOSIS — M5416 Radiculopathy, lumbar region: Secondary | ICD-10-CM | POA: Diagnosis not present

## 2020-03-24 DIAGNOSIS — M62561 Muscle wasting and atrophy, not elsewhere classified, right lower leg: Secondary | ICD-10-CM | POA: Diagnosis not present

## 2020-03-24 DIAGNOSIS — M25561 Pain in right knee: Secondary | ICD-10-CM | POA: Diagnosis not present

## 2020-03-24 DIAGNOSIS — M7061 Trochanteric bursitis, right hip: Secondary | ICD-10-CM | POA: Diagnosis not present

## 2020-03-25 ENCOUNTER — Ambulatory Visit: Payer: Medicare PPO | Admitting: Family Medicine

## 2020-03-26 ENCOUNTER — Encounter: Payer: Self-pay | Admitting: Cardiovascular Disease

## 2020-03-26 ENCOUNTER — Other Ambulatory Visit: Payer: Self-pay | Admitting: Family Medicine

## 2020-03-26 ENCOUNTER — Other Ambulatory Visit: Payer: Self-pay | Admitting: Hematology and Oncology

## 2020-03-26 ENCOUNTER — Ambulatory Visit (INDEPENDENT_AMBULATORY_CARE_PROVIDER_SITE_OTHER): Payer: Medicare PPO | Admitting: Cardiovascular Disease

## 2020-03-26 ENCOUNTER — Other Ambulatory Visit: Payer: Self-pay

## 2020-03-26 VITALS — BP 159/79 | HR 53 | Ht 60.0 in | Wt 155.2 lb

## 2020-03-26 DIAGNOSIS — I480 Paroxysmal atrial fibrillation: Secondary | ICD-10-CM

## 2020-03-26 DIAGNOSIS — I4819 Other persistent atrial fibrillation: Secondary | ICD-10-CM

## 2020-03-26 DIAGNOSIS — I471 Supraventricular tachycardia: Secondary | ICD-10-CM

## 2020-03-26 DIAGNOSIS — I10 Essential (primary) hypertension: Secondary | ICD-10-CM

## 2020-03-26 DIAGNOSIS — T50905A Adverse effect of unspecified drugs, medicaments and biological substances, initial encounter: Secondary | ICD-10-CM

## 2020-03-26 DIAGNOSIS — Z79899 Other long term (current) drug therapy: Secondary | ICD-10-CM | POA: Diagnosis not present

## 2020-03-26 DIAGNOSIS — Z5181 Encounter for therapeutic drug level monitoring: Secondary | ICD-10-CM | POA: Diagnosis not present

## 2020-03-26 DIAGNOSIS — R001 Bradycardia, unspecified: Secondary | ICD-10-CM | POA: Diagnosis not present

## 2020-03-26 DIAGNOSIS — Z7901 Long term (current) use of anticoagulants: Secondary | ICD-10-CM | POA: Diagnosis not present

## 2020-03-26 MED ORDER — DILTIAZEM HCL ER COATED BEADS 120 MG PO CP24: 120.0000 mg | ORAL_CAPSULE | Freq: Every day | ORAL | 3 refills | Status: DC

## 2020-03-26 NOTE — Patient Instructions (Signed)
Medication Instructions:  DECREASE the Diltiazem 120 mg once daily  *If you need a refill on your cardiac medications before your next appointment, please call your pharmacy*   Lab Work: None ordered If you have labs (blood work) drawn today and your tests are completely normal, you will receive your results only by: [name] MyChart Message (if you have MyChart) OR . A paper copy in the mail If you have any lab test that is abnormal or we need to change your treatment, we will call you to review the results.   Testing/Procedures: None ordered   Follow-Up: At [facility], you and your health needs are our priority.  As part of our continuing mission to provide you with exceptional heart care, we have created designated Provider Care Teams.  These Care Teams include your primary Cardiologist (physician) and Advanced Practice Providers (APPs -  Physician Assistants and Nurse Practitioners) who all work together to provide you with the care you need, when you need it.  We recommend signing up for the patient portal called "MyChart".  Sign up information is provided on this After Visit Summary.  MyChart is used to connect with patients for Virtual Visits (Telemedicine).  Patients are able to view lab/test results, encounter notes, upcoming appointments, etc.  Non-urgent messages can be sent to your provider as well.   To learn more about what you can do with MyChart, go to [email or web address].    Your next appointment:   3 month(s)  The format for your next appointment:   In Person  Provider:   You may see Sanda Klein, MD or one of the following Advanced Practice Providers on your designated Care Team:    Almyra Deforest, PA-C  Fabian Sharp, PA-C or   Roby Lofts, [place]

## 2020-03-26 NOTE — Progress Notes (Signed)
Cardiology Office Note:    Date:  03/26/2020   ID:  Jessica Mullins, DOB 1930/11/16, MRN [id number]  PCP:  Gayland Curry, DO  [facility] Cardiologist:  Sanda Klein, MD  [facility] Electrophysiologist:  None   Referring MD: Jolaine Artist, MD   Chief Complaint  Patient presents with   Dizziness    History of Present Illness:    Jessica Mullins is a 86 y.o. female with a hx of paroxysmal atrial fibrillation without significant structural heart disease, remote history of vasovagal syncope, with recent complaints of dizziness and gait unsteadiness.  She was seen in the atrial fibrillation clinic on 9/16 and has worn a 2 week event monitor. I reviewed the data today (not yet formally reviewed) and it shows relatively small abnormalities. There is a tendency to bradycardia and the rhythm during the patient activated strips is mild sinus bradycardia in the mid-50s. No severe bradycardia and there is normal circadian and activity-related heart rate variation. She has had 10 very short episodes of atrial tachycardia for max 13 beats, which do not correlate with the symptoms.  Her flecainide level is 0.34, lower end of therapeutic effect.  The description of her events today is more consistent with orthostatic hypotension, rather than arrhythmia.  She denies angina or dyspnea, either at rest or with activity. No true syncope. Unaware of any palpitations.  Past Medical History:  Diagnosis Date   Allergy    Ankle fracture, left    Atrial fibrillation ([place])    Per [place] New Patient Packet    Breast cancer ([place]) 1115/16   left    Bursitis of right shoulder    Cataract    Family history of cancer    Fracture of right wrist    GERD (gastroesophageal reflux disease)    Hyperlipidemia    Hypertension    Lumbar radiculitis    Per [place] New Patient Packet    Meningioma [facility])    Per [place] New Patient Packet    Skin cancer 2000   melanoma and basal cell    Stress fracture    Right Heel, Per [place] New Patient Packet    Vaso vagal episode    during preparation for colonoscopy    Past Surgical History:  Procedure Laterality Date   ABDOMINAL HYSTERECTOMY  1988   TAH/BSO--FIBROIDS   APPENDECTOMY     BREAST LUMPECTOMY     B/L--FCS   BREAST LUMPECTOMY WITH RADIOACTIVE SEED AND SENTINEL LYMPH NODE BIOPSY Left 06/11/2015   Procedure: LEFT BREAST LUMPECTOMY WITH RADIOACTIVE SEED AND LEFT SENTINEL LYMPH NODE MAPPING;  Surgeon: Erroll Luna, MD;  Location: [place];  Service: General;  Laterality: Left;   CATARACT EXTRACTION  2015   COLONOSCOPY  2010   DG  BONE DENSITY ([place] HX)     EYE SURGERY Bilateral    cataracts   fiberadenoma Bilateral 1978, 1980   GANGLION CYST EXCISION     L  hand   Lipiflow procedure      Current Medications: Current Meds  Medication Sig   alendronate (FOSAMAX) 70 MG tablet TAKE 1 TAB ONCE A WEEK, AT LEAST 30 MIN BEFORE 1ST FOOD.DO NOT LIE DOWN FOR 30 MIN AFTER TAKING.   AMBULATORY NON FORMULARY MEDICATION Walker use daily as directed.  Disp 1 Leg pain M79.604   Biotin (BIOTIN 5000) 5 MG CAPS Take 5 mg by mouth daily.    Calcium Carbonate-Vitamin D (CALTRATE 600+D) 600-400 MG-UNIT per tablet Take 1 tablet by  mouth daily.    Cholecalciferol (VITAMIN D3) 1000 UNITS CAPS Take 1 capsule by mouth daily.     cycloSPORINE (RESTASIS) 0.05 % ophthalmic emulsion PLACE 1 DROP INTO BOTH EYES TWO (TWO) TIMES DAILY.   diltiazem (CARDIZEM CD) 120 MG 24 hr capsule Take 1 capsule (120 mg total) by mouth daily.   ELIQUIS 5 MG TABS tablet TAKE 1 TABLET BY MOUTH TWICE DAILY.   flecainide (TAMBOCOR) 50 MG tablet TAKE ONE TABLET BY MOUTH TWICE A DAY   ketoconazole (NIZORAL) 2 % cream APPLY 1 APPLICATION TOPICALLY DAILY AS NEEDED FOR IRRITATION   Multiple Vitamins-Minerals (PRESERVISION AREDS) TABS Take 2 tablets by mouth daily.   Omega-3 Fatty Acids (FISH OIL PO) Take 1 tablet by mouth daily.    polyethylene  glycol (MIRALAX / GLYCOLAX) 17 g packet Take 17 g by mouth daily.   pravastatin (PRAVACHOL) 40 MG tablet Take 1 tablet (40 mg total) by mouth daily.   Psyllium (METAMUCIL PO) Take 15 mLs by mouth daily.    Turmeric 500 MG CAPS Take 1 capsule by mouth daily.    vitamin B-12 (CYANOCOBALAMIN) 1000 MCG tablet Take 1 tablet (1,000 mcg total) by mouth daily.   [DISCONTINUED] diltiazem (CARDIZEM CD) 180 MG 24 hr capsule TAKE 1 CAPSULE (180 MG TOTAL) BY MOUTH DAILY.     Allergies:   Sulfa antibiotics, Tape, Glucosamine forte [nutritional supplements], and Latex   Social History   Socioeconomic History   Marital status: Widowed    Spouse name: Not on file   Number of children: 2   Years of education: Not on file   Highest education level: Master's degree (e.g., MA, MS, MEng, MEd, [name], [name])  Occupational History   Occupation: Product manager: RETIRED    Comment: retired  Tobacco Use   Smoking status: Never Smoker   Smokeless tobacco: Never Used  Scientific laboratory technician Use: Never used  Substance and Sexual Activity   Alcohol use: No   Drug use: No   Sexual activity: Never  Other Topics Concern   Not on file  Social History Narrative   Patient is widowed.  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    One son one daughter   1 caffeinated beverage daily   She is right-handed.    She works out everyday to a televised [employer] 30 minute program.   No tobacco or alcohol         Per [place] New Patient Packet Abstracted on 01/03/2020      Diet: Left blank       Caffeine: Yes      Married, if yes what year: Widowed, married in [place] you live in a house, apartment, assisted living, condo, trailer, ect: Apartment      Is it one or more stories: One stories, one person       Pets: 1 Neurosurgeon      Current/Past profession: Pharmacist, hospital      Highest level or education completed: MA of Education       Exercise:   Yes             Type and how often: Cardio, chair  aerobics, 3-4 times weekly          Living Will: Yes   DNR: Yes   POA/HPOA: Yes      Functional Status:   Do you have difficulty bathing or dressing yourself? Left blank  Do you have difficulty preparing food or eating? Left blank   Do you have difficulty managing your medications? Left blank   Do you have difficulty managing your finances? Left blank   Do you have difficulty affording your medications? Left blank   Social Determinants of Health   Financial Resource Strain: Low Risk    Difficulty of Paying Living Expenses: Not hard at all  Food Insecurity: No Food Insecurity   Worried About Charity fundraiser in the Last Year: Never true   Ran Out of Food in the Last Year: Never true  Transportation Needs: No Transportation Needs   Lack of Transportation (Medical): No   Lack of Transportation (Non-Medical): No  Physical Activity:    Days of Exercise per Week: Not on file   Minutes of Exercise per Session: Not on file  Stress:    Feeling of Stress : Not on file  Social Connections:    Frequency of Communication with Friends and Family: Not on file   Frequency of Social Gatherings with Friends and Family: Not on file   Attends Religious Services: Not on file   Active Member of Clubs or Organizations: Not on file   Attends Archivist Meetings: Not on file   Marital Status: Not on file     Family History: The patient's family history includes Cancer in her mother; Coronary artery disease in her brother; Diabetes in her brother and maternal aunt; Heart disease in her brother; Heart disease (age of onset: 23) in her father; High Cholesterol in her son; Hyperlipidemia in her brother; Hypertension in her brother and son; Lung cancer in an other family member; Lung cancer (age of onset: 33) in her sister; Thrombosis in an other family member.  ROS:   Please see the history of present illness.     All other systems reviewed and are  negative.  EKGs/Labs/Other Studies Reviewed:    The following studies were reviewed today: Event monitor 09/02-09/15  EKG:  EKG is ordered today.  The ekg ordered today demonstrates sinus bradycardia 54 bpm, left axis deviation, nonspecific ST changes, narrow QRS 94 ms, QTc 366 ms.  Recent Labs: 11/19/2019: ALT 16; BUN 16; Creatinine, Ser 0.71; Hemoglobin 14.6; Platelets 213.0; Potassium 3.9; Sodium 141  Recent Lipid Panel    Component Value Date/Time   CHOL 155 11/19/2019 1403   TRIG 89.0 11/19/2019 1403   HDL 61.40 11/19/2019 1403   CHOLHDL 3 11/19/2019 1403   VLDL 17.8 11/19/2019 1403   LDLCALC 76 11/19/2019 1403   LDLDIRECT 153.4 11/16/2009 0000    Physical Exam:    VS:  BP (!) 159/79    Pulse (!) 53    Ht 5' (1.524 m)    Wt 155 lb 3.2 oz (70.4 kg)    SpO2 98%    BMI 30.31 kg/m     Wt Readings from Last 3 Encounters:  03/26/20 155 lb 3.2 oz (70.4 kg)  03/09/20 153 lb (69.4 kg)  02/27/20 154 lb 7.2 oz (70.1 kg)     GEN:  Well nourished, well developed in no acute distress HEENT: Normal NECK: No JVD; No carotid bruits LYMPHATICS: No lymphadenopathy CARDIAC: RRR, no murmurs, rubs, gallops RESPIRATORY:  Clear to auscultation without rales, wheezing or rhonchi  ABDOMEN: Soft, non-tender, non-distended MUSCULOSKELETAL:  No edema; No deformity  SKIN: Warm and dry NEUROLOGIC:  Alert and oriented x 3 PSYCHIATRIC:  Normal affect   ASSESSMENT:    1. Essential hypertension    PLAN:  In order of problems listed above:  1. AFib: none seen on the 2 week event monitor. On anticoagulation. 2. PAT: appears to be asymptomatic, cannot be implicated in her imbalance problems. 3. Bradycardia: may be contributing to her dizziness which I believe is due to orthostatic hypotension. Reduce diltiazem to 120 mg daily. 4. Flecainide: normal level, no signs of toxicity on ECG or event monitor. 5. Anticoagulation: no overt bleeding. Had a fall with head impact in April, none  since. 6. HTN: allow SBP 150s.   Medication Adjustments/Labs and Tests Ordered: Current medicines are reviewed at length with the patient today.  Concerns regarding medicines are outlined above.  Orders Placed This Encounter  Procedures   EKG 12-Lead   Meds ordered this encounter  Medications   diltiazem (CARDIZEM CD) 120 MG 24 hr capsule    Sig: Take 1 capsule (120 mg total) by mouth daily.    Dispense:  90 capsule    Refill:  3    Patient Instructions  Medication Instructions:  DECREASE the Diltiazem 120 mg once daily  *If you need a refill on your cardiac medications before your next appointment, please call your pharmacy*   Lab Work: None ordered If you have labs (blood work) drawn today and your tests are completely normal, you will receive your results only by:  [place] (if you have MyChart) OR  A paper copy in the mail If you have any lab test that is abnormal or we need to change your treatment, we will call you to review the results.   Testing/Procedures: None ordered   Follow-Up: At [facility], you and your health needs are our priority.  As part of our continuing mission to provide you with exceptional heart care, we have created designated Provider Care Teams.  These Care Teams include your primary Cardiologist (physician) and Advanced Practice Providers (APPs -  Physician Assistants and Nurse Practitioners) who all work together to provide you with the care you need, when you need it.  We recommend signing up for the patient portal called "MyChart".  Sign up information is provided on this After Visit Summary.  MyChart is used to connect with patients for Virtual Visits (Telemedicine).  Patients are able to view lab/test results, encounter notes, upcoming appointments, etc.  Non-urgent messages can be sent to your provider as well.   To learn more about what you can do with MyChart, go to [email or web address].    Your next appointment:   3  month(s)  The format for your next appointment:   In Person  Provider:   You may see Sanda Klein, MD or one of the following Advanced Practice Providers on your designated Care Team:    Almyra Deforest, PA-C  Fabian Sharp, [place] or   Roby Lofts, PA-C       Signed, Sanda Klein, MD  03/26/2020 1:01 PM    [place]

## 2020-03-27 DIAGNOSIS — M5416 Radiculopathy, lumbar region: Secondary | ICD-10-CM | POA: Diagnosis not present

## 2020-03-27 DIAGNOSIS — R278 Other lack of coordination: Secondary | ICD-10-CM | POA: Diagnosis not present

## 2020-03-27 DIAGNOSIS — M25561 Pain in right knee: Secondary | ICD-10-CM | POA: Diagnosis not present

## 2020-03-27 DIAGNOSIS — R2689 Other abnormalities of gait and mobility: Secondary | ICD-10-CM | POA: Diagnosis not present

## 2020-03-27 DIAGNOSIS — M62561 Muscle wasting and atrophy, not elsewhere classified, right lower leg: Secondary | ICD-10-CM | POA: Diagnosis not present

## 2020-03-27 DIAGNOSIS — M7061 Trochanteric bursitis, right hip: Secondary | ICD-10-CM | POA: Diagnosis not present

## 2020-03-30 NOTE — Addendum Note (Signed)
Encounter addended by: Micki Riley, RN on: 03/30/2020 12:13 PM  Actions taken: Imaging Exam ended

## 2020-04-02 DIAGNOSIS — M25561 Pain in right knee: Secondary | ICD-10-CM | POA: Diagnosis not present

## 2020-04-02 DIAGNOSIS — R278 Other lack of coordination: Secondary | ICD-10-CM | POA: Diagnosis not present

## 2020-04-02 DIAGNOSIS — M62561 Muscle wasting and atrophy, not elsewhere classified, right lower leg: Secondary | ICD-10-CM | POA: Diagnosis not present

## 2020-04-02 DIAGNOSIS — M7061 Trochanteric bursitis, right hip: Secondary | ICD-10-CM | POA: Diagnosis not present

## 2020-04-02 DIAGNOSIS — M5416 Radiculopathy, lumbar region: Secondary | ICD-10-CM | POA: Diagnosis not present

## 2020-04-02 DIAGNOSIS — R2689 Other abnormalities of gait and mobility: Secondary | ICD-10-CM | POA: Diagnosis not present

## 2020-04-09 ENCOUNTER — Other Ambulatory Visit: Payer: Self-pay

## 2020-04-09 ENCOUNTER — Ambulatory Visit (HOSPITAL_COMMUNITY)
Admission: RE | Admit: 2020-04-09 | Discharge: 2020-04-09 | Disposition: A | Discharge status: Home / Self Care | Payer: Medicare PPO | Source: Ambulatory Visit | Attending: Internal Medicine | Admitting: Internal Medicine

## 2020-04-09 DIAGNOSIS — I480 Paroxysmal atrial fibrillation: Secondary | ICD-10-CM | POA: Diagnosis not present

## 2020-04-09 DIAGNOSIS — I471 Supraventricular tachycardia: Secondary | ICD-10-CM | POA: Diagnosis not present

## 2020-04-09 DIAGNOSIS — I10 Essential (primary) hypertension: Secondary | ICD-10-CM

## 2020-04-09 NOTE — Progress Notes (Signed)
Heart Failure TeleHealth Note  Due to national recommendations of social distancing due to [place] 19, Audio/video telehealth visit is felt to be most appropriate for this patient at this time.  See MyChart message from today for patient consent regarding telehealth for [facility]. The patient was identified personally using two identifiers.   Date:  04/09/2020   ID:  Jessica Mullins, DOB 1931/06/08, MRN [id number]  Location: Home  Provider location: [facility] Type of Visit: Established patient  PCP:  Gayland Curry, DO  Cardiologist:  Jessica Klein, MD Primary HF: Jessica Mullins  Chief Complaint: Heart Failure follow-up   History of Present Illness:  Jessica Mullins is an 86 y.o woman with h/o HTN, HL, PAF and left breast CA.   Diagnosed with breast CA in 11/17. Underwent lumpectomy in 12/16. Had 2/4 rounds of chemo so far with taxotere and Cytoxan last dose Monday.  Found to have AF with RVR in 2017. Started on Eliquis. Echo normal.   We arranged for TEE/DC-CV on 08/14/15 but when she arrived for procedure she was in NSR. Given high risk of recurrence we started flecainide 50 bid. She had treadmill test to exclude proarrhythmia. Baseline ECG with NSR 73. QRS and PR intervlas normal. No arrhythmias. Nonspecific ST changes with exercise.   She presents via Engineer, civil (consulting) for a telehealth visit today.   I saw her in Clinic about a month ago and was having some palpitations. Zio some SVT. No AF. Referred to Dr. Drue Novel to establish long-term care. Was mildly orthostatic and diltiazem cut back to 120 daily. Flecainide level normal. Remains on Eliquis without bleeding. Says she feels ok. Good days and bad days with regards to imbalance. Doing PT/OT. Taking BP regularly. 130-140/70-80s. No SOB. No falls.   [name] denies symptoms worrisome for COVID 19.   Past Medical History:  Diagnosis Date  . Allergy   . Ankle fracture, left   . Atrial  fibrillation [facility])    Per [place] Patient Packet   . Breast cancer ([place]) 1115/16   left   . Bursitis of right shoulder   . Cataract   . Family history of cancer   . Fracture of right wrist   . GERD (gastroesophageal reflux disease)   . Hyperlipidemia   . Hypertension   . Lumbar radiculitis    Per [facility] New Patient Packet   . Meningioma [facility])    Per [facility] New Patient Packet   . Skin cancer 2000   melanoma and basal cell  . Stress fracture    Right Heel, Per [place] New Patient Packet   . Vaso vagal episode    during preparation for colonoscopy   Past Surgical History:  Procedure Laterality Date  . ABDOMINAL HYSTERECTOMY  1988   TAH/BSO--FIBROIDS  . APPENDECTOMY    . BREAST LUMPECTOMY     B/L--FCS  . BREAST LUMPECTOMY WITH RADIOACTIVE SEED AND SENTINEL LYMPH NODE BIOPSY Left 06/11/2015   Procedure: LEFT BREAST LUMPECTOMY WITH RADIOACTIVE SEED AND LEFT SENTINEL LYMPH NODE MAPPING;  Surgeon: Erroll Luna, MD;  Location: [place];  Service: General;  Laterality: Left;  . CATARACT EXTRACTION  2015  . COLONOSCOPY  2010  . DG  BONE DENSITY ([place] HX)    . EYE SURGERY Bilateral    cataracts  . fiberadenoma Bilateral 1978, 1980  . GANGLION CYST EXCISION     L  hand  . Lipiflow procedure       Current  Outpatient Medications  Medication Sig Dispense Refill  . alendronate (FOSAMAX) 70 MG tablet TAKE 1 TAB ONCE A WEEK, AT LEAST 30 MIN BEFORE 1ST FOOD.DO NOT LIE DOWN FOR 30 MIN AFTER TAKING. 4 tablet 0  . AMBULATORY NON FORMULARY MEDICATION Walker use daily as directed.  Disp 1 Leg pain M79.604 1 each 0  . Biotin (BIOTIN 5000) 5 MG CAPS Take 5 mg by mouth daily.     . Calcium Carbonate-Vitamin D (CALTRATE 600+D) 600-400 MG-UNIT per tablet Take 1 tablet by mouth daily.     . Cholecalciferol (VITAMIN D3) 1000 UNITS CAPS Take 1 capsule by mouth daily.      . cycloSPORINE (RESTASIS) 0.05 % ophthalmic emulsion PLACE 1 DROP INTO BOTH EYES TWO (TWO) TIMES DAILY.    [name] diltiazem (CARDIZEM CD) 120  MG 24 hr capsule Take 1 capsule (120 mg total) by mouth daily. 90 capsule 3  . ELIQUIS 5 MG TABS tablet TAKE 1 TABLET BY MOUTH TWICE DAILY. 60 tablet 0  . flecainide (TAMBOCOR) 50 MG tablet TAKE ONE TABLET BY MOUTH TWICE A DAY 60 tablet 10  . ketoconazole (NIZORAL) 2 % cream APPLY 1 APPLICATION TOPICALLY DAILY AS NEEDED FOR IRRITATION 15 g 1  . Multiple Vitamins-Minerals (PRESERVISION AREDS) TABS Take 2 tablets by mouth daily.    . Omega-3 Fatty Acids (FISH OIL PO) Take 1 tablet by mouth daily.     . polyethylene glycol (MIRALAX / GLYCOLAX) 17 g packet Take 17 g by mouth daily.    . pravastatin (PRAVACHOL) 40 MG tablet Take 1 tablet (40 mg total) by mouth daily. 90 tablet 1  . Psyllium (METAMUCIL PO) Take 15 mLs by mouth daily.     . Turmeric 500 MG CAPS Take 1 capsule by mouth daily.     . vitamin B-12 (CYANOCOBALAMIN) 1000 MCG tablet Take 1 tablet (1,000 mcg total) by mouth daily.     No current facility-administered medications for this encounter.    Allergies:   Sulfa antibiotics, Tape, Glucosamine forte [nutritional supplements], and Latex   Social History:  The patient  reports that she has never smoked. She has never used smokeless tobacco. She reports that she does not drink alcohol and does not use drugs.   Family History:  The patient's family history includes Cancer in her mother; Coronary artery disease in her brother; Diabetes in her brother and maternal aunt; Heart disease in her brother; Heart disease (age of onset: 64) in her father; High Cholesterol in her son; Hyperlipidemia in her brother; Hypertension in her brother and son; Lung cancer in an other family member; Lung cancer (age of onset: 42) in her sister; Thrombosis in an other family member.   ROS:  Please see the history of present illness.   All other systems are personally reviewed and negative.   Exam:  (Video/Tele Health Call; Exam is subjective and or/visual.) General:  Speaks in full sentences. No resp  difficulty. Lungs: Normal respiratory effort with conversation.  Abdomen: Non-distended per patient report Extremities: Pt denies edema. Neuro: Alert & oriented x 3.   Recent Labs: 11/19/2019: ALT 16; BUN 16; Creatinine, Ser 0.71; Hemoglobin 14.6; Platelets 213.0; Potassium 3.9; Sodium 141  Personally reviewed   Wt Readings from Last 3 Encounters:  03/26/20 70.4 kg (155 lb 3.2 oz)  03/09/20 69.4 kg (153 lb)  02/27/20 70.1 kg (154 lb 7.2 oz)      ASSESSMENT AND PLAN:  1. PAF with RVR     --CHADSVASC 4 (age (  4), HTN, female)      --echo 2017 normal     --maintaining NSR on flecainide     --Recently fatigue and mild palpitations. Zio 9/21          1. Sinus rhythm - avg HR of 64  2. Ten runs of SVT - the longest lasting 13 beats with an avg rate of 107 bpm.   3. Rare PACs and PVCs  4. No AF detected     -- Has been referred back to AF Clinic and [facility] general cards -> now seeing Dr. Drue Novel  2. Breast CA     -- s/p mastectomy.  3. HTN c/b orthostasis     -- Diltiazem recently decreased. BP stable.      -- will f/u with Dr. Bonne Dolores  4. Balance issues     --I suspect this is non-cardiac. Continue PT/OT. May be worth Neurology f/i    COVID screen The patient does not have any symptoms that suggest any further testing/ screening at this time.  Social distancing reinforced today.  Recommended follow-up:  As above  Relevant cardiac medications were reviewed at length with the patient today.   The patient does not have concerns regarding their medications at this time.   The following changes were made today:  As above  Today, I have spent 14 minutes with the patient with telehealth technology discussing the above issues .    Signed, Glori Bickers, MD  04/09/2020 1:59 PM  Advanced [facility] Failure [place] [address] Heart and [place] [address] [phone] (office) [phone] (fax)

## 2020-04-15 DIAGNOSIS — R278 Other lack of coordination: Secondary | ICD-10-CM | POA: Diagnosis not present

## 2020-04-15 DIAGNOSIS — M62561 Muscle wasting and atrophy, not elsewhere classified, right lower leg: Secondary | ICD-10-CM | POA: Diagnosis not present

## 2020-04-15 DIAGNOSIS — M5416 Radiculopathy, lumbar region: Secondary | ICD-10-CM | POA: Diagnosis not present

## 2020-04-15 DIAGNOSIS — R2689 Other abnormalities of gait and mobility: Secondary | ICD-10-CM | POA: Diagnosis not present

## 2020-04-15 DIAGNOSIS — M7061 Trochanteric bursitis, right hip: Secondary | ICD-10-CM | POA: Diagnosis not present

## 2020-04-15 DIAGNOSIS — M25561 Pain in right knee: Secondary | ICD-10-CM | POA: Diagnosis not present

## 2020-04-20 DIAGNOSIS — H26491 Other secondary cataract, right eye: Secondary | ICD-10-CM | POA: Diagnosis not present

## 2020-04-20 DIAGNOSIS — H35033 Hypertensive retinopathy, bilateral: Secondary | ICD-10-CM | POA: Diagnosis not present

## 2020-04-20 DIAGNOSIS — H353132 Nonexudative age-related macular degeneration, bilateral, intermediate dry stage: Secondary | ICD-10-CM | POA: Diagnosis not present

## 2020-04-22 ENCOUNTER — Other Ambulatory Visit: Payer: Self-pay | Admitting: Family Medicine

## 2020-04-22 ENCOUNTER — Other Ambulatory Visit: Payer: Self-pay | Admitting: Hematology and Oncology

## 2020-04-22 DIAGNOSIS — I4819 Other persistent atrial fibrillation: Secondary | ICD-10-CM

## 2020-04-30 DIAGNOSIS — L820 Inflamed seborrheic keratosis: Secondary | ICD-10-CM | POA: Diagnosis not present

## 2020-04-30 DIAGNOSIS — L570 Actinic keratosis: Secondary | ICD-10-CM | POA: Diagnosis not present

## 2020-05-22 ENCOUNTER — Other Ambulatory Visit: Payer: Self-pay | Admitting: Hematology and Oncology

## 2020-05-22 ENCOUNTER — Other Ambulatory Visit: Payer: Self-pay | Admitting: Family Medicine

## 2020-05-22 DIAGNOSIS — E785 Hyperlipidemia, unspecified: Secondary | ICD-10-CM

## 2020-05-22 DIAGNOSIS — I4819 Other persistent atrial fibrillation: Secondary | ICD-10-CM

## 2020-05-26 DIAGNOSIS — R928 Other abnormal and inconclusive findings on diagnostic imaging of breast: Secondary | ICD-10-CM | POA: Diagnosis not present

## 2020-05-26 DIAGNOSIS — Z853 Personal history of malignant neoplasm of breast: Secondary | ICD-10-CM | POA: Diagnosis not present

## 2020-05-26 LAB — HM MAMMOGRAPHY

## 2020-05-27 ENCOUNTER — Other Ambulatory Visit: Payer: Self-pay | Admitting: Family Medicine

## 2020-05-27 ENCOUNTER — Other Ambulatory Visit: Payer: Self-pay

## 2020-05-27 DIAGNOSIS — M8580 Other specified disorders of bone density and structure, unspecified site: Secondary | ICD-10-CM

## 2020-05-27 MED ORDER — ALENDRONATE SODIUM 70 MG PO TABS: ORAL_TABLET | ORAL | 0 refills | Status: DC

## 2020-05-27 NOTE — Telephone Encounter (Signed)
Patient called stating [facility] told her we had refused a refill request. Requests were sent to Dr. Etter Sjogren and Dr. Charlett Blake, which both refused. I told her we did not receive a request. A refill was sent to [facility].

## 2020-05-28 ENCOUNTER — Encounter: Payer: Self-pay | Admitting: Internal Medicine

## 2020-06-22 ENCOUNTER — Other Ambulatory Visit: Payer: Self-pay | Admitting: Internal Medicine

## 2020-06-22 ENCOUNTER — Other Ambulatory Visit: Payer: Self-pay | Admitting: Hematology and Oncology

## 2020-06-22 DIAGNOSIS — M8580 Other specified disorders of bone density and structure, unspecified site: Secondary | ICD-10-CM

## 2020-06-22 DIAGNOSIS — I4819 Other persistent atrial fibrillation: Secondary | ICD-10-CM

## 2020-06-23 ENCOUNTER — Telehealth: Payer: Self-pay | Admitting: Internal Medicine

## 2020-06-23 NOTE — Telephone Encounter (Signed)
Patient was thinking that she was receiving the Prolia injection on the 19 th, however she did mention that it was told to her that she can only receive it at [facility].Should I go ahead and make her an appointment around that time for the injection her or just let you discuss it at her appointment? Please advise. She stated she will stop the fosamax, because she takes it weekly.

## 2020-06-23 NOTE — Telephone Encounter (Signed)
Spoke with pt this am to discuss coming in for prolia inj here at [facility], she is not clear on how this works & needs guidance.  Jessica Mullins is still currently taking Fosomax. She dosen't know when she should stop this medication in order to start prolia. Her appt with Dr Renato Gails is sch for 07/15/20 at [facility] clinic. She would like to arrange transportation thru [facility] to come here for injection. Please advise...  Thanks, Misty Stanley

## 2020-06-23 NOTE — Telephone Encounter (Signed)
She can stop fosamax the day before she starts prolia which is then given every 6 months, and I typically recheck her bone density after 2 years of treatment to be sure it's improving or at least stable.  Of course, we cannot administer the prolia at [facility] only [facility].  If she wants to discuss this more at her visit, that's fine with me.  At least she is on treatment of some type until then.  I just didn't know what happened after we got the initial approval.

## 2020-06-24 NOTE — Telephone Encounter (Signed)
Let's plan to decide at the appt with me.

## 2020-06-27 DIAGNOSIS — G459 Transient cerebral ischemic attack, unspecified: Secondary | ICD-10-CM

## 2020-06-27 HISTORY — DX: Transient cerebral ischemic attack, unspecified: G45.9

## 2020-07-08 ENCOUNTER — Other Ambulatory Visit: Payer: Self-pay

## 2020-07-08 ENCOUNTER — Encounter: Payer: Self-pay | Admitting: Cardiovascular Disease

## 2020-07-08 ENCOUNTER — Ambulatory Visit: Payer: Medicare PPO | Admitting: Cardiovascular Disease

## 2020-07-08 VITALS — BP 136/78 | HR 53 | Ht 61.0 in | Wt 160.0 lb

## 2020-07-08 DIAGNOSIS — R001 Bradycardia, unspecified: Secondary | ICD-10-CM | POA: Diagnosis not present

## 2020-07-08 DIAGNOSIS — I471 Supraventricular tachycardia: Secondary | ICD-10-CM | POA: Diagnosis not present

## 2020-07-08 DIAGNOSIS — Z79899 Other long term (current) drug therapy: Secondary | ICD-10-CM

## 2020-07-08 DIAGNOSIS — I480 Paroxysmal atrial fibrillation: Secondary | ICD-10-CM

## 2020-07-08 DIAGNOSIS — Z5181 Encounter for therapeutic drug level monitoring: Secondary | ICD-10-CM | POA: Diagnosis not present

## 2020-07-08 DIAGNOSIS — T50905A Adverse effect of unspecified drugs, medicaments and biological substances, initial encounter: Secondary | ICD-10-CM

## 2020-07-08 DIAGNOSIS — I10 Essential (primary) hypertension: Secondary | ICD-10-CM | POA: Diagnosis not present

## 2020-07-08 DIAGNOSIS — Z7901 Long term (current) use of anticoagulants: Secondary | ICD-10-CM | POA: Diagnosis not present

## 2020-07-08 MED ORDER — DILTIAZEM HCL 30 MG PO TABS: 30.0000 mg | ORAL_TABLET | Freq: Two times a day (BID) | ORAL | 4 refills | Status: DC

## 2020-07-08 MED ORDER — FLECAINIDE ACETATE 50 MG PO TABS
50.0000 mg | ORAL_TABLET | Freq: Two times a day (BID) | ORAL | 10 refills | Status: DC
Start: 2020-07-08 — End: 2021-06-22

## 2020-07-08 NOTE — Patient Instructions (Addendum)
Medication Instructions:  STOP the Diltiazem CD 120 mg long acting START the Diltiazem 30 mg twice daily.  *If you need a refill on your cardiac medications before your next appointment, please call your pharmacy*   Lab Work: None ordered If you have labs (blood work) drawn today and your tests are completely normal, you will receive your results only by: [name] MyChart Message (if you have MyChart) OR . A paper copy in the mail If you have any lab test that is abnormal or we need to change your treatment, we will call you to review the results.   Testing/Procedures: None ordered   Follow-Up: At [facility], you and your health needs are our priority.  As part of our continuing mission to provide you with exceptional heart care, we have created designated Provider Care Teams.  These Care Teams include your primary Cardiologist (physician) and Advanced Practice Providers (APPs -  Physician Assistants and Nurse Practitioners) who all work together to provide you with the care you need, when you need it.  We recommend signing up for the patient portal called "MyChart".  Sign up information is provided on this After Visit Summary.  MyChart is used to connect with patients for Virtual Visits (Telemedicine).  Patients are able to view lab/test results, encounter notes, upcoming appointments, etc.  Non-urgent messages can be sent to your provider as well.   To learn more about what you can do with MyChart, go to [email or web address].    Your next appointment:   3 month(s)  The format for your next appointment:   In Person  Provider:   You may see Sanda Klein, MD or one of the following Advanced Practice Providers on your designated Care Team:    Almyra Deforest, PA-C  Fabian Sharp, PA-C or   Roby Lofts, [place]

## 2020-07-08 NOTE — Progress Notes (Signed)
Cardiology Office Note:    Date:  07/13/2020   ID:  Jessica Mullins, DOB 1930/06/30, MRN [id number]  PCP:  Gayland Curry, DO  [facility] Cardiologist:  Sanda Klein, MD  [place] Electrophysiologist:  None   Referring MD: Gayland Curry, DO   No chief complaint on file.   History of Present Illness:    Jessica Mullins is a 87 y.o. female with a hx of paroxysmal atrial fibrillation without significant structural heart disease, remote history of vasovagal syncope, now with complaints of fatigue.  Currently she is complaining mostly of feeling very tired.  She wakes up feeling like "a truck has run me down".  Throughout the day she slowly gathers a little more energy, but is always tired.  She does not need to take naps during the day.  She does not fall asleep when reading a book or watching a movie.  Palpitations have rarely been a problem, less than once a month.  When they occur they last for 2 or 3 hours and are not associated with syncope, dyspnea or angina.  She has not had dizziness, focal neurological complaints or any falls, injuries or serious bleeding problems.  She denies leg edema, orthopnea or PND.  Her event monitor last year did show a tendency to bradycardia with heart rates frequently in the 50s and borderline criteria for chronotropic incompetence.  Her average heart rate was 64.  The maximum achieved heart rate and sinus rhythm was 103 (75% of predicted for age).   Past Medical History:  Diagnosis Date  . Allergy   . Ankle fracture, left   . Atrial fibrillation [facility])    Per [place] Patient Packet   . Breast cancer ([place]) 1115/16   left   . Bursitis of right shoulder   . Cataract   . Family history of cancer   . Fracture of right wrist   . GERD (gastroesophageal reflux disease)   . Hyperlipidemia   . Hypertension   . Lumbar radiculitis    Per [facility] New Patient Packet   . Meningioma [facility])    Per [facility] New Patient Packet   . Skin cancer 2000    melanoma and basal cell  . Stress fracture    Right Heel, Per [place] New Patient Packet   . Vaso vagal episode    during preparation for colonoscopy    Past Surgical History:  Procedure Laterality Date  . ABDOMINAL HYSTERECTOMY  1988   TAH/BSO--FIBROIDS  . APPENDECTOMY    . BREAST LUMPECTOMY     B/L--FCS  . BREAST LUMPECTOMY WITH RADIOACTIVE SEED AND SENTINEL LYMPH NODE BIOPSY Left 06/11/2015   Procedure: LEFT BREAST LUMPECTOMY WITH RADIOACTIVE SEED AND LEFT SENTINEL LYMPH NODE MAPPING;  Surgeon: Erroll Luna, MD;  Location: [place];  Service: General;  Laterality: Left;  . CATARACT EXTRACTION  2015  . COLONOSCOPY  2010  . DG  BONE DENSITY ([place] HX)    . EYE SURGERY Bilateral    cataracts  . fiberadenoma Bilateral 1978, 1980  . GANGLION CYST EXCISION     L  hand  . Lipiflow procedure      Current Medications: Current Meds  Medication Sig  . alendronate (FOSAMAX) 70 MG tablet TAKE 1 TAB ONCE A WEEK, AT LEAST 30 MIN BEFORE 1ST FOOD.DO NOT LIE DOWN FOR 30 MIN AFTER TAKING.  [name] AMBULATORY NON FORMULARY MEDICATION Walker use daily as directed.  Disp 1 Leg pain M79.604  . Biotin 5 MG  CAPS Take 5 mg by mouth daily.   . Calcium Carbonate-Vitamin D 600-400 MG-UNIT tablet Take 1 tablet by mouth daily.  . Cholecalciferol (VITAMIN D3) 1000 UNITS CAPS Take 1 capsule by mouth daily.  . cycloSPORINE (RESTASIS) 0.05 % ophthalmic emulsion PLACE 1 DROP INTO BOTH EYES TWO (TWO) TIMES DAILY.  [name] diltiazem (CARDIZEM) 30 MG tablet Take 1 tablet (30 mg total) by mouth 2 (two) times daily.  [name] ELIQUIS 5 MG TABS tablet TAKE 1 TABLET BY MOUTH TWICE DAILY.  [name] ketoconazole (NIZORAL) 2 % cream APPLY 1 APPLICATION TOPICALLY DAILY AS NEEDED FOR IRRITATION  . Multiple Vitamins-Minerals (PRESERVISION AREDS) TABS Take 2 tablets by mouth daily.  . Omega-3 Fatty Acids (FISH OIL PO) Take 1 tablet by mouth daily.   . polyethylene glycol (MIRALAX / GLYCOLAX) 17 g packet Take 17 g by mouth daily.  . pravastatin  (PRAVACHOL) 40 MG tablet TAKE 1 TABLET ONCE DAILY.  [name] Psyllium (METAMUCIL PO) Take 15 mLs by mouth daily.   . Turmeric 500 MG CAPS Take 1 capsule by mouth daily.   . vitamin B-12 (CYANOCOBALAMIN) 1000 MCG tablet Take 1 tablet (1,000 mcg total) by mouth daily.  . [DISCONTINUED] diltiazem (CARDIZEM CD) 120 MG 24 hr capsule Take 1 capsule (120 mg total) by mouth daily.  . [DISCONTINUED] flecainide (TAMBOCOR) 50 MG tablet TAKE ONE TABLET BY MOUTH TWICE A DAY     Allergies:   Sulfa antibiotics, Tape, Glucosamine forte [nutritional supplements], and Latex   Social History   Socioeconomic History  . Marital status: Widowed    Spouse name: Not on file  . Number of children: 2  . Years of education: Not on file  . Highest education level: Master's degree (e.g., MA, MS, MEng, MEd, [name], [name])  Occupational History  . Occupation: Product manager: RETIRED    Comment: retired  Tobacco Use  . Smoking status: Never Smoker  . Smokeless tobacco: Never Used  Vaping Use  . Vaping Use: Never used  Substance and Sexual Activity  . Alcohol use: No  . Drug use: No  . Sexual activity: Never  Other Topics Concern  . Not on file  Social History Narrative   Patient is widowed.  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    One son one daughter   1 caffeinated beverage daily   She is right-handed.    She works out everyday to a televised [employer] 30 minute program.   No tobacco or alcohol         Per [place] New Patient Packet Abstracted on 01/03/2020      Diet: Left blank       Caffeine: Yes      Married, if yes what year: Widowed, married in [place] you live in a house, apartment, assisted living, condo, trailer, ect: Apartment      Is it one or more stories: One stories, one person       Pets: 1 Neurosurgeon      Current/Past profession: Pharmacist, hospital      Highest level or education completed: MA of Education       Exercise:   Yes             Type and how often: Cardio, chair aerobics,  3-4 times weekly          Living Will: Yes   DNR: Yes   POA/HPOA: Yes      Functional Status:  Do you have difficulty bathing or dressing yourself? Left blank   Do you have difficulty preparing food or eating? Left blank   Do you have difficulty managing your medications? Left blank   Do you have difficulty managing your finances? Left blank   Do you have difficulty affording your medications? Left blank   Social Determinants of Health   Financial Resource Strain: Low Risk   . Difficulty of Paying Living Expenses: Not hard at all  Food Insecurity: No Food Insecurity  . Worried About Charity fundraiser in the Last Year: Never true  . Ran Out of Food in the Last Year: Never true  Transportation Needs: No Transportation Needs  . Lack of Transportation (Medical): No  . Lack of Transportation (Non-Medical): No  Physical Activity: Not on file  Stress: Not on file  Social Connections: Not on file     Family History: The patient's family history includes Cancer in her mother; Coronary artery disease in her brother; Diabetes in her brother and maternal aunt; Heart disease in her brother; Heart disease (age of onset: 4) in her father; High Cholesterol in her son; Hyperlipidemia in her brother; Hypertension in her brother and son; Lung cancer in an other family member; Lung cancer (age of onset: 71) in her sister; Thrombosis in an other family member.  ROS:   Please see the history of present illness.     All other systems reviewed and are negative.  EKGs/Labs/Other Studies Reviewed:    The following studies were reviewed today: Event monitor 09/02-09/15  EKG:  EKG is ordered today.  It shows sinus bradycardia with left axis deviation, not quite meeting criteria for anterior fascicular block.  Non-corrected QT 422 ms  Recent Labs: 11/19/2019: ALT 16; BUN 16; Creatinine, Ser 0.71; Hemoglobin 14.6; Platelets 213.0; Potassium 3.9; Sodium 141  Recent Lipid Panel    Component Value  Date/Time   CHOL 155 11/19/2019 1403   TRIG 89.0 11/19/2019 1403   HDL 61.40 11/19/2019 1403   CHOLHDL 3 11/19/2019 1403   VLDL 17.8 11/19/2019 1403   LDLCALC 76 11/19/2019 1403   LDLDIRECT 153.4 11/16/2009 0000    Physical Exam:    VS:  BP 136/78 (BP Location: Left Arm, Patient Position: Sitting, Cuff Size: Large)   Pulse (!) 53   Ht 5\' 1"  (1.549 m)   Wt 160 lb (72.6 kg)   BMI 30.23 kg/m     Wt Readings from Last 3 Encounters:  07/08/20 160 lb (72.6 kg)  03/26/20 155 lb 3.2 oz (70.4 kg)  03/09/20 153 lb (69.4 kg)      General: Alert, oriented x3, no distress, borderline obese Head: no evidence of trauma, PERRL, EOMI, no exophtalmos or lid lag, no myxedema, no xanthelasma; normal ears, nose and oropharynx Neck: normal jugular venous pulsations and no hepatojugular reflux; brisk carotid pulses without delay and no carotid bruits Chest: clear to auscultation, no signs of consolidation by percussion or palpation, normal fremitus, symmetrical and full respiratory excursions Cardiovascular: normal position and quality of the apical impulse, slow regular rhythm, normal first and second heart sounds, no murmurs, rubs or gallops Abdomen: no tenderness or distention, no masses by palpation, no abnormal pulsatility or arterial bruits, normal bowel sounds, no hepatosplenomegaly Extremities: no clubbing, cyanosis or edema; 2+ radial, ulnar and brachial pulses bilaterally; 2+ right femoral, posterior tibial and dorsalis pedis pulses; 2+ left femoral, posterior tibial and dorsalis pedis pulses; no subclavian or femoral bruits Neurological: grossly nonfocal Psych: Normal mood and  affect   ASSESSMENT:    1. Paroxysmal atrial fibrillation (HCC)   2. PAT (paroxysmal atrial tachycardia) ([place])   3. Bradycardia, drug induced   4. Encounter for monitoring flecainide therapy   5. Long term (current) use of anticoagulants   6. Essential hypertension    PLAN:    In order of problems listed  above:  1. AFib: Episodes are infrequent and not particularly symptomatic.  We will decrease her dose of diltiazem.  Continue flecainide.  Discussed the possibility that she may be developing tachycardia-bradycardia syndrome, which will need treatment with a pacemaker.  2. PAT: Also noted on her event monitor but appeared to be asymptomatic. 3. Bradycardia: Reduce diltiazem to immediate release 30 mg twice daily.  May have to stop it altogether 4. Flecainide: No signs of toxicity to date.  QRS complex is narrow.  Discussed the fact that we typically need to continue on with AV nodal blocking agents to avoid possibility of extreme RVR with atrial tachycardia or atrial flutter.  The situation may change so that we may not be able to achieve symptomatic relief without a pacemaker in place. 5. Anticoagulation: No falls or bleeding problems recently. 6. HTN: Has a tendency to have orthostatic hypotension.  Okay to allow systolic blood pressure to the low 150s.   Medication Adjustments/Labs and Tests Ordered: Current medicines are reviewed at length with the patient today.  Concerns regarding medicines are outlined above.  Orders Placed This Encounter  Procedures  . EKG 12-Lead   Meds ordered this encounter  Medications  . diltiazem (CARDIZEM) 30 MG tablet    Sig: Take 1 tablet (30 mg total) by mouth 2 (two) times daily.    Dispense:  60 tablet    Refill:  4  . flecainide (TAMBOCOR) 50 MG tablet    Sig: Take 1 tablet (50 mg total) by mouth 2 (two) times daily.    Dispense:  60 tablet    Refill:  10    Patient Instructions  Medication Instructions:  STOP the Diltiazem CD 120 mg long acting START the Diltiazem 30 mg twice daily.  *If you need a refill on your cardiac medications before your next appointment, please call your pharmacy*   Lab Work: None ordered If you have labs (blood work) drawn today and your tests are completely normal, you will receive your results only by: [name] MyChart  Message (if you have MyChart) OR . A paper copy in the mail If you have any lab test that is abnormal or we need to change your treatment, we will call you to review the results.   Testing/Procedures: None ordered   Follow-Up: At [facility], you and your health needs are our priority.  As part of our continuing mission to provide you with exceptional heart care, we have created designated Provider Care Teams.  These Care Teams include your primary Cardiologist (physician) and Advanced Practice Providers (APPs -  Physician Assistants and Nurse Practitioners) who all work together to provide you with the care you need, when you need it.  We recommend signing up for the patient portal called "MyChart".  Sign up information is provided on this After Visit Summary.  MyChart is used to connect with patients for Virtual Visits (Telemedicine).  Patients are able to view lab/test results, encounter notes, upcoming appointments, etc.  Non-urgent messages can be sent to your provider as well.   To learn more about what you can do with MyChart, go to [email or web address].  Your next appointment:   3 month(s)  The format for your next appointment:   In Person  Provider:   You may see Sanda Klein, MD or one of the following Advanced Practice Providers on your designated Care Team:    Almyra Deforest, PA-C  Fabian Sharp, [place] or   Roby Lofts, PA-C      Signed, Sanda Klein, MD  07/13/2020 12:35 PM    [place]

## 2020-07-15 ENCOUNTER — Other Ambulatory Visit: Payer: Self-pay

## 2020-07-15 ENCOUNTER — Encounter: Payer: Self-pay | Admitting: Internal Medicine

## 2020-07-15 ENCOUNTER — Non-Acute Institutional Stay: Payer: Medicare PPO | Admitting: Internal Medicine

## 2020-07-15 VITALS — BP 132/72 | HR 66 | Temp 96.8°F | Ht 61.0 in | Wt 162.8 lb

## 2020-07-15 DIAGNOSIS — M1711 Unilateral primary osteoarthritis, right knee: Secondary | ICD-10-CM | POA: Diagnosis not present

## 2020-07-15 DIAGNOSIS — D329 Benign neoplasm of meninges, unspecified: Secondary | ICD-10-CM

## 2020-07-15 DIAGNOSIS — I480 Paroxysmal atrial fibrillation: Secondary | ICD-10-CM | POA: Diagnosis not present

## 2020-07-15 DIAGNOSIS — M5416 Radiculopathy, lumbar region: Secondary | ICD-10-CM

## 2020-07-15 DIAGNOSIS — R2689 Other abnormalities of gait and mobility: Secondary | ICD-10-CM | POA: Diagnosis not present

## 2020-07-15 DIAGNOSIS — K5904 Chronic idiopathic constipation: Secondary | ICD-10-CM

## 2020-07-15 NOTE — Progress Notes (Signed)
Location:  Occupational psychologist of Service:  Clinic (12)  Provider: Meia Emley L. Mariea Clonts, D.O., C.M.D.  Goals of Care:  Advanced Directives 07/15/2020  Does Patient Have a Medical Advance Directive? Yes  Type of Advance Directive -  Does patient want to make changes to medical advance directive? -  Copy of [place] in Chart? -  Would patient like information on creating a medical advance directive? -  Pre-existing out of facility DNR order (yellow form or pink MOST form) -   Chief Complaint  Patient presents with  . Medical Management of Chronic Issues    6 month follow up. Patient stated strange things have been happening to her such as standing and legs going out and her hands do not want to listen to her.     HPI: Patient is a 87 y.o. female seen today for medical management of chronic diseases.  She is fairly new here.  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   She's dropping things and cannot hold onto things.  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  She has her left third trigger finger.  She's stumbling around   [facility] had low back pain with radiculopathy before.  She did get a new mattress.  She has to use her walker when she first gets up in the am.  Then after an hour or two she doesn't need it anymore.  No neck pain.  Her legs always feel like rubber.  She has to stand for a few minutes before she can move.  Has to catch herself.  Drops items with both hands.  The spells of dropping only lasts a few seconds and then she can grasp things again.    Has her possible meningioma found on MRI 10/05/19 after she fell and hit her head just before she moved out here.  She'd seen Dr. [name] with neurosurgery sometime b/w April and July when she met me for her new pt visit.     She did her therapy exercises for quite a while to help with balance.  She stopped doing them and she didn't think there was a difference.  She went back to the exercises.  Now does them occasionally.  Some were related to her eyes.   Did go for epidurals for her low back pain and radiculopathy.  First time worked for 6 mos, then reappeared and got another one and it only lasted 2 mos.  Opted not to get more epidurals.  For 1.5-2 wks she used voltaren on her knee and it all went away.    Constipation is challenging.  Encouraged regular use of her bowel regimen.  Does have to get up to urinate often at night sometimes to where she does not even get rest.  She always has to get up at least 1-2 times regularly.    Past Medical History:  Diagnosis Date  . Allergy   . Ankle fracture, left   . Atrial fibrillation [facility])    Per [place] Patient Packet   . Breast cancer ([place]) 1115/16   left   . Bursitis of right shoulder   . Cataract   . Family history of cancer   . Fracture of right wrist   . GERD (gastroesophageal reflux disease)   . Hyperlipidemia   .  Hypertension   . Lumbar radiculitis    Per [facility] New Patient Packet   . Meningioma [facility])    Per [facility] New Patient Packet   . Skin cancer 2000   melanoma and basal cell  . Stress fracture    Right Heel, Per [place] New Patient Packet   . Vaso vagal episode    during preparation for colonoscopy    Past Surgical History:  Procedure Laterality Date  . ABDOMINAL HYSTERECTOMY  1988   TAH/BSO--FIBROIDS  . APPENDECTOMY    . BREAST LUMPECTOMY     B/L--FCS  . BREAST LUMPECTOMY WITH RADIOACTIVE SEED AND SENTINEL LYMPH NODE BIOPSY Left 06/11/2015   Procedure: LEFT BREAST LUMPECTOMY WITH RADIOACTIVE SEED AND LEFT SENTINEL LYMPH NODE MAPPING;  Surgeon: Erroll Luna, MD;  Location: [place];  Service: General;  Laterality: Left;  . CATARACT EXTRACTION  2015  . COLONOSCOPY  2010  . DG  BONE DENSITY ([place] HX)    . EYE SURGERY Bilateral    cataracts   . fiberadenoma Bilateral 1978, 1980  . GANGLION CYST EXCISION     L  hand  . Lipiflow procedure      Allergies  Allergen Reactions  . Sulfa Antibiotics Other (See Comments)    As a child, rigid as a stick and not responsive  . Tape Other (See Comments)    Blisters, Please use "paper" tape only for short periods of time  . Glucosamine Forte [Nutritional Supplements] Rash  . Latex Rash    Outpatient Encounter Medications as of 07/15/2020  Medication Sig  . alendronate (FOSAMAX) 70 MG tablet TAKE 1 TAB ONCE A WEEK, AT LEAST 30 MIN BEFORE 1ST FOOD.DO NOT LIE DOWN FOR 30 MIN AFTER TAKING.  [name] AMBULATORY NON FORMULARY MEDICATION Walker use daily as directed.  Disp 1 Leg pain M79.604  . Biotin 5 MG CAPS Take 5 mg by mouth daily.   . Calcium Carbonate-Vitamin D 600-400 MG-UNIT tablet Take 1 tablet by mouth daily.  . Cholecalciferol (VITAMIN D3) 1000 UNITS CAPS Take 1 capsule by mouth daily.  . cycloSPORINE (RESTASIS) 0.05 % ophthalmic emulsion PLACE 1 DROP INTO BOTH EYES TWO (TWO) TIMES DAILY.  [name] diltiazem (CARDIZEM) 30 MG tablet Take 1 tablet (30 mg total) by mouth 2 (two) times daily.  [name] ELIQUIS 5 MG TABS tablet TAKE 1 TABLET BY MOUTH TWICE DAILY.  . flecainide (TAMBOCOR) 50 MG tablet Take 1 tablet (50 mg total) by mouth 2 (two) times daily.  [name] ketoconazole (NIZORAL) 2 % cream APPLY 1 APPLICATION TOPICALLY DAILY AS NEEDED FOR IRRITATION  . Multiple Vitamins-Minerals (PRESERVISION AREDS) TABS Take 2 tablets by mouth daily.  . Omega-3 Fatty Acids (FISH OIL PO) Take 1 tablet by mouth daily.   . polyethylene glycol (MIRALAX / GLYCOLAX) 17 g packet Take 17 g by mouth daily.  . pravastatin (PRAVACHOL) 40 MG tablet TAKE 1 TABLET ONCE DAILY.  [name] Psyllium (METAMUCIL PO) Take 15 mLs by mouth daily.   . Turmeric 500 MG CAPS Take 1 capsule by mouth daily.   . vitamin B-12 (CYANOCOBALAMIN) 1000 MCG tablet Take 1 tablet (1,000 mcg total) by mouth daily.   No facility-administered encounter  medications on file as of 07/15/2020.    Review of Systems:  Review of Systems  Constitutional: Positive for malaise/fatigue. Negative for chills and fever.  HENT: Negative for congestion and sore throat.   Respiratory: Negative for cough and shortness of breath.   Cardiovascular: Negative for chest pain, palpitations and leg swelling.  Gastrointestinal: Positive for constipation. Negative for abdominal pain.  Genitourinary: Negative for dysuria.  Musculoskeletal: Positive for back pain. Negative for falls and neck pain.  Neurological: Positive for tingling and sensory change. Negative for loss of consciousness.       Dropping items  Endo/Heme/Allergies: Does not bruise/bleed easily.  Psychiatric/Behavioral: Negative for depression and memory loss. The patient is not nervous/anxious and does not have insomnia.     Health Maintenance  Topic Date Due  . COVID-19 Vaccine (4 - Booster for Pfizer series) 09/27/2020  . MAMMOGRAM  05/26/2021  . TETANUS/TDAP  09/18/2021  . INFLUENZA VACCINE  Completed  . DEXA SCAN  Completed  . PNA vac Low Risk Adult  Completed    Physical Exam: Vitals:   07/15/20 1410  BP: 132/72  Pulse: 66  Temp: (!) 96.8 F (36 C)  TempSrc: Temporal  SpO2: 97%  Weight: 162 lb 12.8 oz (73.8 kg)  Height: 5' 1"  (1.549 m)   Body mass index is 30.76 kg/m. Physical Exam Vitals reviewed.  Constitutional:      General: She is not in acute distress.    Appearance: She is not toxic-appearing.  HENT:     Head: Normocephalic and atraumatic.  Eyes:     Conjunctiva/sclera: Conjunctivae normal.     Pupils: Pupils are equal, round, and reactive to light.  Cardiovascular:     Rate and Rhythm: Rhythm irregular.     Heart sounds: No murmur heard.   Pulmonary:     Effort: Pulmonary effort is normal.     Breath sounds: Normal breath sounds.  Abdominal:     General: Bowel sounds are normal.     Palpations: Abdomen is soft.     Tenderness: There is no abdominal  tenderness.  Musculoskeletal:        General: Normal range of motion.     Right lower leg: No edema.     Left lower leg: No edema.  Neurological:     General: No focal deficit present.     Mental Status: She is alert and oriented to person, place, and time.     Cranial Nerves: No cranial nerve deficit.     Gait: Gait abnormal.     Deep Tendon Reflexes: Reflexes normal.     Comments: Grip strength and arm strength intact, gait unsteady   Psychiatric:        Mood and Affect: Mood normal.     Labs reviewed: Basic Metabolic Panel: Recent Labs    10/05/19 0808 11/19/19 1403  NA 141 141  K 3.9 3.9  CL 107 103  CO2 24 32  GLUCOSE 132* 93  BUN 22 16  CREATININE 0.85 0.71  CALCIUM 8.6* 10.1   Liver Function Tests: Recent Labs    10/05/19 0808 11/19/19 1403  AST 23 16  ALT 20 16  ALKPHOS 49 67  BILITOT 0.9 0.5  PROT 5.6* 6.0  ALBUMIN 3.2* 4.2   Recent Labs    10/05/19 0808  LIPASE 28   No results for input(s): AMMONIA in the last 8760 hours. CBC: Recent Labs    10/05/19 0808 11/19/19 1403  WBC 11.2* 6.7  NEUTROABS 10.3* 4.7  HGB 15.2* 14.6  HCT 45.8 42.8  MCV 96.4 94.1  PLT 207 213.0   Lipid Panel: Recent Labs    11/19/19 1403  CHOL 155  HDL 61.40  LDLCALC 76  TRIG 89.0  CHOLHDL 3   No results found for: HGBA1C  Procedures since last visit: No  results found.  Assessment/Plan 1. Right lumbar radiculitis - referred to neurology about this and her numerous other neurologic symptoms -she's unsteady already so I was hesitant to use gabapentin - Ambulatory referral to Neurology  2. Meningioma [facility]) - had neurosurgery consult with Dr. [name] and requests not to see him again, really does not want surgery either but symptoms are getting worse and seems to me are due to this meningioma beginning to compress more on her cervical spine - Ambulatory referral to Neurology for further evaluation   3. Localized osteoarthritis of right knee -cont voltaren  gel  4. Paroxysmal atrial fibrillation (HCC) -rate controlled with cardizem and doing fine on eliquis NOAC, also on flecainide  5. Chronic idiopathic constipation -cont daily miralax, metamucil, encouraged hydration and mobility as she can tolerate  6. Balance problem - worsening, seems she needs confirmation if all of her symptoms are due to her probable meningioma or other etiologies, as well - Ambulatory referral to Neurology  Labs/tests ordered:  Cbc, cmp, flp, b12, vit D and neurology   Next appt:  3 mos  Sammy Cassar L. Ival Pacer, D.O. [facility] [address], [place] [address] Cell Phone (Mon-Fri 8am-5pm):  [phone] On Call:  [phone] & follow prompts after 5pm & weekends Office Phone:  [phone] Office Fax:  [phone]

## 2020-07-16 ENCOUNTER — Encounter: Payer: Self-pay | Admitting: Internal Medicine

## 2020-07-16 DIAGNOSIS — K219 Gastro-esophageal reflux disease without esophagitis: Secondary | ICD-10-CM | POA: Diagnosis not present

## 2020-07-16 DIAGNOSIS — D519 Vitamin B12 deficiency anemia, unspecified: Secondary | ICD-10-CM | POA: Diagnosis not present

## 2020-07-16 DIAGNOSIS — E785 Hyperlipidemia, unspecified: Secondary | ICD-10-CM | POA: Diagnosis not present

## 2020-07-16 DIAGNOSIS — I10 Essential (primary) hypertension: Secondary | ICD-10-CM | POA: Diagnosis not present

## 2020-07-16 DIAGNOSIS — E559 Vitamin D deficiency, unspecified: Secondary | ICD-10-CM | POA: Diagnosis not present

## 2020-07-16 LAB — HEPATIC FUNCTION PANEL
ALT: 19 (ref 7–35)
AST: 19 (ref 13–35)

## 2020-07-16 LAB — COMPREHENSIVE METABOLIC PANEL
Albumin: 4.4 (ref 3.5–5.0)
Calcium: 10.4 (ref 8.7–10.7)
GFR calc Af Amer: >90
GFR calc non Af Amer: 78.57
Globulin: 1.9

## 2020-07-16 LAB — LIPID PANEL
Cholesterol: 152 (ref 0–200)
HDL: 74 — AB (ref 35–70)
LDL Cholesterol: 65
Triglycerides: 69 (ref 40–160)

## 2020-07-16 LAB — CBC AND DIFFERENTIAL
Hemoglobin: 15.5 (ref 12.0–16.0)
WBC: 6.1

## 2020-07-16 LAB — CBC: RBC: 4.93 (ref 3.87–5.11)

## 2020-07-16 LAB — BASIC METABOLIC PANEL
BUN: 15 (ref 4–21)
CO2: 31 — AB (ref 13–22)
Chloride: 103 (ref 99–108)
Creatinine: 0.7 (ref 0.5–1.1)
Glucose: 94
Potassium: 4.4 (ref 3.4–5.3)
Sodium: 142 (ref 137–147)

## 2020-07-16 NOTE — Telephone Encounter (Signed)
Patient notified and spoke with Jessica Mullins regarding her Prolia

## 2020-07-16 NOTE — Telephone Encounter (Signed)
Patient called and stated that you were talking about taking her off of her Fosamax and trying her on Prolia. Stated that you were going to discuss it at her appointment but it didn't get mentioned. Patient wants to know what she needs to do.  Please Advise.

## 2020-07-16 NOTE — Telephone Encounter (Signed)
I do think we should go ahead and get her started on prolia.  I believe we previously looked into it for her.  She will need to come to [facility] twice a year for the prolia injections.  She may continue the fosamax until the day she gets her first prolia.  I will cc: Lattie Haw on this.

## 2020-07-22 ENCOUNTER — Telehealth: Payer: Self-pay | Admitting: Adult Health

## 2020-07-22 NOTE — Telephone Encounter (Signed)
Rescheduled appts per 1/21 email/LC schedule change. Pt confirmed new appt date and time.

## 2020-07-23 ENCOUNTER — Ambulatory Visit: Payer: Medicare PPO | Admitting: *Deleted

## 2020-07-23 ENCOUNTER — Other Ambulatory Visit: Payer: Self-pay

## 2020-07-23 DIAGNOSIS — M8580 Other specified disorders of bone density and structure, unspecified site: Secondary | ICD-10-CM

## 2020-07-23 MED ORDER — DENOSUMAB 60 MG/ML ~~LOC~~ SOSY
60.0000 mg | PREFILLED_SYRINGE | Freq: Once | SUBCUTANEOUS | Status: AC
  Administered 2020-07-23: 60 mg via SUBCUTANEOUS

## 2020-07-24 ENCOUNTER — Encounter: Payer: Self-pay | Admitting: Neurology

## 2020-07-24 ENCOUNTER — Other Ambulatory Visit: Payer: Self-pay | Admitting: Hematology and Oncology

## 2020-07-24 DIAGNOSIS — I4819 Other persistent atrial fibrillation: Secondary | ICD-10-CM

## 2020-07-28 ENCOUNTER — Ambulatory Visit: Payer: Medicare PPO | Admitting: Internal Medicine

## 2020-07-29 ENCOUNTER — Encounter: Payer: Medicare PPO | Admitting: Adult Health

## 2020-08-06 ENCOUNTER — Inpatient Hospital Stay: Payer: Medicare PPO | Attending: Adult Health | Admitting: Adult Health

## 2020-08-06 ENCOUNTER — Other Ambulatory Visit: Payer: Self-pay

## 2020-08-06 ENCOUNTER — Encounter: Payer: Self-pay | Admitting: Adult Health

## 2020-08-06 VITALS — BP 147/81 | HR 62 | Temp 97.7°F | Resp 17 | Ht 61.0 in | Wt 161.0 lb

## 2020-08-06 DIAGNOSIS — Z853 Personal history of malignant neoplasm of breast: Secondary | ICD-10-CM | POA: Diagnosis not present

## 2020-08-06 DIAGNOSIS — C50112 Malignant neoplasm of central portion of left female breast: Secondary | ICD-10-CM | POA: Diagnosis not present

## 2020-08-06 DIAGNOSIS — Z171 Estrogen receptor negative status [ER-]: Secondary | ICD-10-CM

## 2020-08-06 NOTE — Progress Notes (Signed)
CLINIC:  Survivorship   REASON FOR VISIT:  Routine follow-up for history of breast cancer.   BRIEF ONCOLOGIC HISTORY:  Oncology History  Cancer of central portion of left female breast ([place])  05/04/2015 Mammogram    left breast distortion , breast density category A , 7 mm by ultrasound at 3:00 position middle depth   05/12/2015 Initial Diagnosis    left breast biopsy: invasive ductal cancer with DCIS , ER 0%, PR 0%, Ki-67 5%, HER-2 negative ratio 0.83   06/11/2015 Surgery   Left lumpectomy ([place]): DCIS, left additional margin: IDC 1.3 cm + DCIS 1/4 LN positive, grade 2, margins negative, LVI present, ER 0%, PR 0%, HER-2 negative ratio 0.60, Ki-67 5%, T1cN1a stage II a, Mammaprint high risk luminal B   07/20/2015 - 09/21/2015 Chemotherapy   Taxotere and Cytoxan adjuvant chemotherapy  4 cycles   07/24/2015 Procedure   Genetic testing: Negative Ashkenazi Founder mutation panel   01/21/2016 - 02/17/2016 Radiation Therapy   Adjuvant radiation therapy Lisbeth Renshaw). Left breast: 42.5 Gy in 17 fractions. Left breast boost: 7.5 Gy in 3 fractions.       INTERVAL HISTORY:  Ms. Forrey presents to the Survivorship Clinic today for routine follow-up for her history of breast cancer.  Overall, she reports feeling quite well.   Her most recent mammogram was completed in 04/2020 and showed no evidence of malignancy and breast density category A.  She underwent bone density testin on 11/23/2018 which showed an improvement in her osteopenia in her spine to -1.6 (up from -2.0).   Felicie has been doing well.  She has transitioned to living at [facility].  She had a fall and cracked her skull and is in the assisted living section since that time.  Since her move she has had no further falls.  She is otherwise doing well and she has had no health changes since that time.    REVIEW OF SYSTEMS:  Review of Systems  Constitutional: Negative for appetite change, chills, fatigue, fever and unexpected weight  change.  HENT:   Negative for hearing loss, lump/mass and trouble swallowing.   Eyes: Negative for eye problems and icterus.  Respiratory: Negative for chest tightness, cough and shortness of breath.   Cardiovascular: Negative for chest pain, leg swelling and palpitations.  Gastrointestinal: Negative for abdominal distention, abdominal pain, constipation, diarrhea, nausea and vomiting.  Endocrine: Negative for hot flashes.  Genitourinary: Negative for difficulty urinating.   Musculoskeletal: Negative for arthralgias.  Skin: Negative for itching and rash.  Neurological: Negative for dizziness, extremity weakness, headaches and numbness.  Hematological: Negative for adenopathy. Does not bruise/bleed easily.  Psychiatric/Behavioral: Negative for depression. The patient is not nervous/anxious.    Breast: Denies any new nodularity, masses, tenderness, nipple changes, or nipple discharge.       PAST MEDICAL/SURGICAL HISTORY:  Past Medical History:  Diagnosis Date  . Allergy   . Ankle fracture, left   . Atrial fibrillation [facility])    Per [facility] Patient Packet   . Breast cancer ([place]) 1115/16   left   . Bursitis of right shoulder   . Cataract   . Family history of cancer   . Fracture of right wrist   . GERD (gastroesophageal reflux disease)   . Hyperlipidemia   . Hypertension   . Lumbar radiculitis    Per [facility] New Patient Packet   . Meningioma [facility])    Per [facility] New Patient Packet   . Skin cancer 2000   melanoma and  basal cell  . Stress fracture    Right Heel, Per [place] New Patient Packet   . Vaso vagal episode    during preparation for colonoscopy   Past Surgical History:  Procedure Laterality Date  . ABDOMINAL HYSTERECTOMY  1988   TAH/BSO--FIBROIDS  . APPENDECTOMY    . BREAST LUMPECTOMY     B/L--[facility]  . BREAST LUMPECTOMY WITH RADIOACTIVE SEED AND SENTINEL LYMPH NODE BIOPSY Left 06/11/2015   Procedure: LEFT BREAST LUMPECTOMY WITH RADIOACTIVE SEED AND LEFT SENTINEL LYMPH NODE  MAPPING;  Surgeon: Erroll Luna, MD;  Location: [facility];  Service: General;  Laterality: Left;  . CATARACT EXTRACTION  2015  . COLONOSCOPY  2010  . DG  BONE DENSITY ([place] [facility])    . EYE SURGERY Bilateral    cataracts  . fiberadenoma Bilateral 1978, 1980  . GANGLION CYST EXCISION     L  hand  . Lipiflow procedure       ALLERGIES:  Allergies  Allergen Reactions  . Sulfa Antibiotics Other (See Comments)    As a child, rigid as a stick and not responsive  . Tape Other (See Comments)    Blisters, Please use "paper" tape only for short periods of time  . Glucosamine Forte [Nutritional Supplements] Rash  . Latex Rash     CURRENT MEDICATIONS:  Outpatient Encounter Medications as of 08/06/2020  Medication Sig  . alendronate (FOSAMAX) 70 MG tablet TAKE 1 TAB ONCE A WEEK, AT LEAST 30 MIN BEFORE 1ST FOOD.DO NOT LIE DOWN FOR 30 MIN AFTER TAKING.  [name] AMBULATORY NON FORMULARY MEDICATION Walker use daily as directed.  Disp 1 Leg pain M79.604  . Biotin 5 MG CAPS Take 5 mg by mouth daily.   . Calcium Carbonate-Vitamin D 600-400 MG-UNIT tablet Take 1 tablet by mouth daily.  . Cholecalciferol (VITAMIN D3) 1000 UNITS CAPS Take 1 capsule by mouth daily.  . cycloSPORINE (RESTASIS) 0.05 % ophthalmic emulsion PLACE 1 DROP INTO BOTH EYES TWO (TWO) TIMES DAILY.  [name] diltiazem (CARDIZEM) 30 MG tablet Take 1 tablet (30 mg total) by mouth 2 (two) times daily.  [name] ELIQUIS 5 MG TABS tablet TAKE 1 TABLET BY MOUTH TWICE DAILY.  . flecainide (TAMBOCOR) 50 MG tablet Take 1 tablet (50 mg total) by mouth 2 (two) times daily.  [name] ketoconazole (NIZORAL) 2 % cream APPLY 1 APPLICATION TOPICALLY DAILY AS NEEDED FOR IRRITATION  . Multiple Vitamins-Minerals (PRESERVISION AREDS) TABS Take 2 tablets by mouth daily.  . Omega-3 Fatty Acids (FISH OIL PO) Take 1 tablet by mouth daily.   . polyethylene glycol (MIRALAX / GLYCOLAX) 17 g packet Take 17 g by mouth daily.  . pravastatin (PRAVACHOL) 40 MG tablet TAKE 1 TABLET ONCE DAILY.   [name] Psyllium (METAMUCIL PO) Take 15 mLs by mouth daily.   . Turmeric 500 MG CAPS Take 1 capsule by mouth daily.   . vitamin B-12 (CYANOCOBALAMIN) 1000 MCG tablet Take 1 tablet (1,000 mcg total) by mouth daily.   No facility-administered encounter medications on file as of 08/06/2020.     ONCOLOGIC FAMILY HISTORY:  Family History  Problem Relation Age of Onset  . Cancer Mother        mets to bone--? primary  . Coronary artery disease Brother        died of M! @ 85  . Diabetes Brother   . Hyperlipidemia Brother   . Hypertension Brother   . Heart disease Brother        chf  . Heart disease  Father 63       MI  . Lung cancer Sister 101       former smoker  . Diabetes Maternal Aunt   . Lung cancer Other   . Thrombosis Other        thromboembolism clotting disorder--? father died of ? clot   . Hypertension Son   . High Cholesterol Son     GENETIC COUNSELING/TESTING: negative  SOCIAL HISTORY:  Social History   Socioeconomic History  . Marital status: Widowed    Spouse name: Not on file  . Number of children: 2  . Years of education: Not on file  . Highest education level: Master's degree (e.g., MA, MS, MEng, MEd, [name], [name])  Occupational History  . Occupation: Product manager: RETIRED    Comment: retired  Tobacco Use  . Smoking status: Never Smoker  . Smokeless tobacco: Never Used  Vaping Use  . Vaping Use: Never used  Substance and Sexual Activity  . Alcohol use: No  . Drug use: No  . Sexual activity: Never  Other Topics Concern  . Not on file  Social History Narrative   Patient is widowed.  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    One son one daughter   1 caffeinated beverage daily   She is right-handed.    She works out everyday to a televised [employer] 30 minute program.   No tobacco or alcohol         Per [facility] New Patient Packet Abstracted on 01/03/2020      Diet: Left blank       Caffeine: Yes      Married, if yes what year: Widowed,  married in [place] you live in a house, apartment, assisted living, condo, trailer, ect: Apartment      Is it one or more stories: One stories, one person       Pets: 1 Neurosurgeon      Current/Past profession: Pharmacist, hospital      Highest level or education completed: MA of Education       Exercise:   Yes             Type and how often: Cardio, chair aerobics, 3-4 times weekly          Living Will: Yes   DNR: Yes   POA/HPOA: Yes      Functional Status:   Do you have difficulty bathing or dressing yourself? Left blank   Do you have difficulty preparing food or eating? Left blank   Do you have difficulty managing your medications? Left blank   Do you have difficulty managing your finances? Left blank   Do you have difficulty affording your medications? Left blank   Social Determinants of Health   Financial Resource Strain: Low Risk   . Difficulty of Paying Living Expenses: Not hard at all  Food Insecurity: No Food Insecurity  . Worried About Charity fundraiser in the Last Year: Never true  . Ran Out of Food in the Last Year: Never true  Transportation Needs: No Transportation Needs  . Lack of Transportation (Medical): No  . Lack of Transportation (Non-Medical): No  Physical Activity: Not on file  Stress: Not on file  Social Connections: Not on file  Intimate Partner Violence: Not on file     PHYSICAL EXAMINATION:  Vital Signs: Vitals:   08/06/20 1413  BP: (!) 147/81  Pulse: 62  Resp: 17  Temp: 97.7 F (36.5 C)  SpO2: 99%   Filed Weights   08/06/20 1413  Weight: 161 lb (73 kg)   General: Well-nourished, well-appearing female in no acute distress.  Unaccompanied today.   HEENT: Head is normocephalic.  Pupils equal and reactive to light. Conjunctivae clear without exudate.  Sclerae anicteric. Oral mucosa is pink, moist.  Oropharynx is pink without lesions or erythema.  Lymph: No cervical, supraclavicular, or infraclavicular lymphadenopathy noted on palpation.   Cardiovascular: Regular rate and rhythm.[name] Respiratory: Clear to auscultation bilaterally. Chest expansion symmetric; breathing non-labored.  Breast Exam:  -Left breast: No appreciable masses on palpation. No skin redness, thickening, or peau d'orange appearance; no nipple retraction or nipple discharge; mild distortion in symmetry at previous lumpectomy site well healed scar without erythema or nodularity.  -Right breast: No appreciable masses on palpation. No skin redness, thickening, or peau d'orange appearance; no nipple retraction or nipple discharge -Axilla: No axillary adenopathy bilaterally.  GI: Abdomen soft and round; non-tender, non-distended. Bowel sounds normoactive. No hepatosplenomegaly.   GU: Deferred.  Neuro: No focal deficits. Steady gait.  Psych: Mood and affect normal and appropriate for situation.  MSK: No focal spinal tenderness to palpation, full range of motion in bilateral upper extremities Extremities: No edema. Skin: Warm and dry.  LABORATORY DATA:  None for this visit   DIAGNOSTIC IMAGING:  Most recent mammogram:       ASSESSMENT AND PLAN:  Ms.. Koffler is a pleasant 87 y.o. female with history of Stage IIA left breast invasive ductal carcinoma, ER-/PR-/HER2-, diagnosed in 04/2015, treated with lumpectomy, adjuvant chemotherapy, and adjuvant radiation therapy. She presents to the Survivorship Clinic for surveillance and routine follow-up.   1. History of breast cancer:  Ms. Totaro is currently clinically and radiographically without evidence of disease or recurrence of breast cancer. She will be due for mammogram in 04/2021; orders placed today.  She will return in one year for LTS follow-up.  I encouraged her to call me with any questions or concerns before her next visit at the cancer center, and I would be happy to see her sooner, if needed.    2. Bone health:  Given Ms. Stahly's age, history of breast cancer, she is at risk for bone  demineralization. Her last DEXA scan was on 11/23/2018 and showed mild osteopenia with a T score of -1.6 in the Spine.  She will be due for repeat in 10/2020.  In the meantime, she was encouraged to increase her consumption of foods rich in calcium, as well as increase her weight-bearing activities.  She was given education on specific food and activities to promote bone health.  3. Cancer screening:  Due to Ms. Ice's history and her age, she should receive screening for skin cancers, colon cancer. She was encouraged to follow-up with her PCP for appropriate cancer screenings.   4. Health maintenance and wellness promotion: Ms. Dohrmann was encouraged to consume 5-7 servings of fruits and vegetables per day. She was also encouraged to engage in moderate to vigorous exercise for 30 minutes per day most days of the week. She was instructed to limit her alcohol consumption and continue to abstain from tobacco use.    Dispo:  -Return to cancer center in 1 year for LTS f/u -Mammogram in 04/2021 -Bone density in 10/2020   Total encounter time: 20 minutes*  Wilber Bihari, NP 08/06/20 2:43 PM Medical Oncology and Hematology [facility] [place], [place] [address] Tel.  [phone]    Fax. [phone]  *Total Encounter Time as defined by the Centers for Medicare and Medicaid Services includes, in addition to the face-to-face time of a patient visit (documented in the note above) non-face-to-face time: obtaining and reviewing outside history, ordering and reviewing medications, tests or procedures, care coordination (communications with other health care professionals or caregivers) and documentation in the medical record.   Note: PRIMARY CARE PROVIDER Gayland Curry, [place] [phone]

## 2020-08-07 ENCOUNTER — Telehealth: Payer: Self-pay | Admitting: Adult Health

## 2020-08-07 NOTE — Telephone Encounter (Signed)
Scheduled appts per 2/10 los. Left voicemail with appt date and time.

## 2020-08-17 ENCOUNTER — Encounter: Payer: Self-pay | Admitting: Internal Medicine

## 2020-09-10 ENCOUNTER — Emergency Department (HOSPITAL_BASED_OUTPATIENT_CLINIC_OR_DEPARTMENT_OTHER)
Admission: EM | Admit: 2020-09-10 | Discharge: 2020-09-10 | Disposition: A | Discharge status: Home / Self Care | Payer: Medicare PPO | Attending: Emergency Medicine | Admitting: Emergency Medicine

## 2020-09-10 ENCOUNTER — Emergency Department (HOSPITAL_BASED_OUTPATIENT_CLINIC_OR_DEPARTMENT_OTHER): Payer: Medicare PPO

## 2020-09-10 ENCOUNTER — Encounter (HOSPITAL_BASED_OUTPATIENT_CLINIC_OR_DEPARTMENT_OTHER): Payer: Self-pay | Admitting: *Deleted

## 2020-09-10 ENCOUNTER — Other Ambulatory Visit: Payer: Self-pay

## 2020-09-10 DIAGNOSIS — Z7901 Long term (current) use of anticoagulants: Secondary | ICD-10-CM | POA: Diagnosis not present

## 2020-09-10 DIAGNOSIS — M5030 Other cervical disc degeneration, unspecified cervical region: Secondary | ICD-10-CM | POA: Insufficient documentation

## 2020-09-10 DIAGNOSIS — M542 Cervicalgia: Secondary | ICD-10-CM

## 2020-09-10 DIAGNOSIS — D329 Benign neoplasm of meninges, unspecified: Secondary | ICD-10-CM | POA: Diagnosis not present

## 2020-09-10 DIAGNOSIS — Z85828 Personal history of other malignant neoplasm of skin: Secondary | ICD-10-CM | POA: Insufficient documentation

## 2020-09-10 DIAGNOSIS — Z79899 Other long term (current) drug therapy: Secondary | ICD-10-CM | POA: Insufficient documentation

## 2020-09-10 DIAGNOSIS — Z91040 Latex allergy status: Secondary | ICD-10-CM | POA: Diagnosis not present

## 2020-09-10 DIAGNOSIS — I10 Essential (primary) hypertension: Secondary | ICD-10-CM | POA: Insufficient documentation

## 2020-09-10 DIAGNOSIS — Z853 Personal history of malignant neoplasm of breast: Secondary | ICD-10-CM | POA: Diagnosis not present

## 2020-09-10 DIAGNOSIS — R030 Elevated blood-pressure reading, without diagnosis of hypertension: Secondary | ICD-10-CM

## 2020-09-10 MED ORDER — ACETAMINOPHEN 325 MG PO TABS
650.0000 mg | ORAL_TABLET | Freq: Once | ORAL | Status: AC
  Administered 2020-09-10: 650 mg via ORAL
  Filled 2020-09-10: qty 2

## 2020-09-10 MED ORDER — METHOCARBAMOL 500 MG PO TABS: 500.0000 mg | ORAL_TABLET | Freq: Three times a day (TID) | ORAL | 0 refills | Status: DC | PRN

## 2020-09-10 NOTE — ED Provider Notes (Signed)
[facility] EMERGENCY DEPT Provider Note   CSN: [id number] Arrival date & time: 09/10/20  1937     History Chief Complaint  Patient presents with  . Neck Pain    Jessica Mullins is a 87 y.o. female.  Patient c/o neck pain for past several weeks, worse in past week. States will get acute onset pain in neck/diffuse, worse w movement/positional change of neck, sharp, occasionally radiating towards bil shoulders, and occasionally shooting down back or up towards head - states different at different times with different movements. Does not notice when completely still. No extremity numbness/weakness. No loss of normal functional ability, although states occasionally will drop things. Denies headache. No fever/chills. No recent fall, injury or strain.  Pt with hx ddd, also with hx meningioma at/near craniocervical junction - saw NS last year, and pt indicates was not advised on any particular treatment or follow up.   The history is provided by the patient.  Neck Pain Associated symptoms: no chest pain, no fever, no headaches, no numbness and no weakness        Past Medical History:  Diagnosis Date  . Allergy   . Ankle fracture, left   . Atrial fibrillation [facility])    Per [place] Patient Packet   . Breast cancer ([place]) 1115/16   left   . Bursitis of right shoulder   . Cataract   . Family history of cancer   . Fracture of right wrist   . GERD (gastroesophageal reflux disease)   . Hyperlipidemia   . Hypertension   . Lumbar radiculitis    Per [facility] New Patient Packet   . Meningioma [facility])    Per [facility] New Patient Packet   . Skin cancer 2000   melanoma and basal cell  . Stress fracture    Right Heel, Per [place] New Patient Packet   . Vaso vagal episode    during preparation for colonoscopy    Patient Active Problem List   Diagnosis Date Noted  . Senile osteopenia 01/12/2020  . History of breast cancer 01/12/2020  . Meningioma ([place]) 01/12/2020  . History of melanoma  01/12/2020  . Right lumbar radiculitis 01/12/2020  . Balance problem 04/05/2019  . Constipation by delayed colonic transit 12/13/2017  . Hemorrhoids 02/14/2017  . Atrial fibrillation ([place]) 08/10/2015  . Orthostatic hypotension 08/04/2015  . Leukocytosis 07/27/2015  . Genetic testing 07/27/2015  . Family history of cancer   . Cancer of central portion of left female breast ([place]) 06/01/2015  . Bilateral low back pain without sciatica 11/18/2014  . Right shoulder pain 04/10/2014  . Muscle spasm of back 01/13/2014  . Olecranon bursitis of left elbow 08/26/2013  . Posterolateral cervical muscle strain 12/14/2012  . Hyperlipidemia 02/16/2010  . TMJ PAIN 02/16/2010  . SEBORRHEIC KERATOSIS 08/24/2009  . Essential hypertension 10/18/2007  . OSTEOPENIA 07/12/2007  . Melanoma of skin ([place]) 10/17/2006  . BREAST CYST 10/17/2006  . COLONOSCOPY, HX OF 10/17/2006    Past Surgical History:  Procedure Laterality Date  . ABDOMINAL HYSTERECTOMY  1988   TAH/BSO--FIBROIDS  . APPENDECTOMY    . BREAST LUMPECTOMY     B/L--FCS  . BREAST LUMPECTOMY WITH RADIOACTIVE SEED AND SENTINEL LYMPH NODE BIOPSY Left 06/11/2015   Procedure: LEFT BREAST LUMPECTOMY WITH RADIOACTIVE SEED AND LEFT SENTINEL LYMPH NODE MAPPING;  Surgeon: Erroll Luna, MD;  Location: [facility];  Service: General;  Laterality: Left;  . CATARACT EXTRACTION  2015  . COLONOSCOPY  2010  . DG  BONE DENSITY ([place] HX)    . EYE SURGERY Bilateral    cataracts  . fiberadenoma Bilateral 1978, 1980  . GANGLION CYST EXCISION     L  hand  . Lipiflow procedure       OB History   No obstetric history on file.     Family History  Problem Relation Age of Onset  . Cancer Mother        mets to bone--? primary  . Coronary artery disease Brother        died of M! @ 25  . Diabetes Brother   . Hyperlipidemia Brother   . Hypertension Brother   . Heart disease Brother        chf  . Heart disease Father 11       MI  . Lung cancer Sister 52        former smoker  . Diabetes Maternal Aunt   . Lung cancer Other   . Thrombosis Other        thromboembolism clotting disorder--? father died of ? clot   . Hypertension Son   . High Cholesterol Son     Social History   Tobacco Use  . Smoking status: Never Smoker  . Smokeless tobacco: Never Used  Vaping Use  . Vaping Use: Never used  Substance Use Topics  . Alcohol use: No  . Drug use: No    Home Medications Prior to Admission medications   Medication Sig Start Date End Date Taking? Authorizing Provider  alendronate (FOSAMAX) 70 MG tablet TAKE 1 TAB ONCE A WEEK, AT LEAST 30 MIN BEFORE 1ST FOOD.DO NOT LIE DOWN FOR 30 MIN AFTER TAKING. 06/22/20   Reed, Tiffany L, DO  AMBULATORY NON FORMULARY MEDICATION Walker use daily as directed.  Disp 1 Leg pain M79.604 05/21/19   Gregor Hams, MD  Biotin 5 MG CAPS Take 5 mg by mouth daily.     [provider]  Calcium Carbonate-Vitamin D 600-400 MG-UNIT tablet Take 1 tablet by mouth daily.    [provider]  Cholecalciferol (VITAMIN D3) 1000 UNITS CAPS Take 1 capsule by mouth daily.    [provider]  cycloSPORINE (RESTASIS) 0.05 % ophthalmic emulsion PLACE 1 DROP INTO BOTH EYES TWO (TWO) TIMES DAILY. 10/11/16   [provider]  diltiazem (CARDIZEM) 30 MG tablet Take 1 tablet (30 mg total) by mouth 2 (two) times daily. 07/08/20   Croitoru, Mihai, MD  ELIQUIS 5 MG TABS tablet TAKE 1 TABLET BY MOUTH TWICE DAILY. 07/27/20   Nicholas Lose, MD  flecainide (TAMBOCOR) 50 MG tablet Take 1 tablet (50 mg total) by mouth 2 (two) times daily. 07/08/20   Croitoru, Mihai, MD  ketoconazole (NIZORAL) 2 % cream APPLY 1 APPLICATION TOPICALLY DAILY AS NEEDED FOR IRRITATION 10/04/19   Carollee Herter, Alferd Apa, DO  Multiple Vitamins-Minerals (PRESERVISION AREDS) TABS Take 2 tablets by mouth daily.    [provider]  Omega-3 Fatty Acids (FISH OIL PO) Take 1 tablet by mouth daily.     [provider]  polyethylene  glycol (MIRALAX / GLYCOLAX) 17 g packet Take 17 g by mouth daily.    [provider]  pravastatin (PRAVACHOL) 40 MG tablet TAKE 1 TABLET ONCE DAILY. 05/23/20   Ann Held, DO  Psyllium (METAMUCIL PO) Take 15 mLs by mouth daily.     [provider]  Turmeric 500 MG CAPS Take 1 capsule by mouth daily.     [provider]  vitamin B-12 (CYANOCOBALAMIN) 1000 MCG tablet Take 1 tablet (1,000 mcg total) by mouth daily. 07/19/18   Nicholas Lose, MD    Allergies    Sulfa antibiotics, Tape, Glucosamine forte [nutritional supplements], and Latex  Review of Systems   Review of Systems  Constitutional: Negative for chills and fever.  HENT: Negative for sore throat.   Eyes: Negative for redness.  Respiratory: Negative for cough and shortness of breath.   Cardiovascular: Negative for chest pain.  Gastrointestinal: Negative for abdominal pain, nausea and vomiting.  Genitourinary: Negative for flank pain.  Musculoskeletal: Positive for neck pain. Negative for back pain.  Skin: Negative for rash.  Neurological: Negative for weakness, numbness and headaches.  Hematological: Bruises/bleeds easily.       +anticoag therapy.   Psychiatric/Behavioral: Negative for confusion.    Physical Exam Updated Vital Signs BP (!) 187/99 (BP Location: Right Arm)   Pulse 70   Temp 98 F (36.7 C)   Resp 16   SpO2 99%   Physical Exam Vitals and nursing note reviewed.  Constitutional:      Appearance: Normal appearance. She is well-developed.  HENT:     Head: Atraumatic.     Nose: Nose normal.     Mouth/Throat:     Mouth: Mucous membranes are moist.  Eyes:     General: No scleral icterus.    Conjunctiva/sclera: Conjunctivae normal.  Neck:     Trachea: No tracheal deviation.     Comments: Non tender, soft tissue prominence at base of neck at cervical-thoracic region. Cardiovascular:     Rate and Rhythm: Normal rate.     Pulses: Normal pulses.  Pulmonary:     Effort:  Pulmonary effort is normal. No respiratory distress.  Abdominal:     General: There is no distension.  Genitourinary:    Comments: No cva tenderness.  Musculoskeletal:        General: No swelling.     Cervical back: Normal range of motion and neck supple. No rigidity. No muscular tenderness.  Lymphadenopathy:     Cervical: No cervical adenopathy.  Skin:    General: Skin is warm and dry.     Findings: No rash.  Neurological:     Mental Status: She is alert.     Comments: Alert, speech normal. Motor/sens grossly intact bil. stre 5/5 bilateral upper extremities, sens intact. Steady gait.   Psychiatric:        Mood and Affect: Mood normal.     ED Results / Procedures / Treatments   Labs (all labs ordered are listed, but only abnormal results are displayed) Labs Reviewed - No data to display  EKG None  Radiology CT CERVICAL SPINE WO CONTRAST  Result Date: 09/10/2020 CLINICAL DATA:  Neck pain, cervical radiculopathy EXAM: CT CERVICAL SPINE WITHOUT CONTRAST TECHNIQUE: Multidetector CT imaging of the cervical spine was performed without intravenous contrast. Multiplanar CT image reconstructions were also generated. COMPARISON:  None. FINDINGS: Alignment: Slight anterolisthesis of C2 on C3 and C3 on C4 related to facet disease. Skull base and vertebrae: No acute fracture. No primary bone lesion or focal pathologic process. Soft tissues and spinal canal: No prevertebral fluid or swelling. No visible canal hematoma. Disc levels: Diffuse degenerative disc disease and facet disease. Mild bilateral multilevel neural foraminal narrowing, right slightly greater than left. Upper chest: Nodular density in the left upper lobe measures 5 mm. Other: Bilateral thyroid nodules, the largest posteriorly in the right thyroid lobe measuring 12 mm. Not clinically significant; no follow-up  imaging recommended (ref: J Am Coll Radiol. 2015 Feb;12(2): 143-50). IMPRESSION: Diffuse degenerative disc and facet disease.  Mild bilateral multilevel neural foraminal narrowing, right slightly greater than left. No acute bony abnormality. 5 mm left upper lobe pulmonary nodule. No follow-up needed if patient is low-risk. Non-contrast chest CT can be considered in 12 months if patient is high-risk. This recommendation follows the consensus statement: Guidelines for Management of Incidental Pulmonary Nodules Detected on CT Images: From the Fleischner Society 2017; Radiology 2017; 284:228-243. Electronically Signed   By: Rolm Baptise M.D.   On: 09/10/2020 10:44    Procedures Procedures   Medications Ordered in ED Medications - No data to display  ED Course  I have reviewed the triage vital signs and the nursing notes.  Pertinent labs & imaging results that were available during my care of the patient were reviewed by me and considered in my medical decision making (see chart for details).    MDM Rules/Calculators/A&P                         Imaging ordered.   Reviewed nursing notes and prior charts for additional history.   CT reviewed/interpreted by me - neg acute.   Given chronic, and now worsening neck pain, ddd and meningioma, recommend pcp/NS f/u.  Incidental note of small lung nodule, discussed w pt, given age/nonsmoker, etc, likely not clinically relevant, pcp f/u.   Acetaminophen po.   Pt appears stable for d/c.         Final Clinical Impression(s) / ED Diagnoses Final diagnoses:  None    Rx / DC Orders ED Discharge Orders    None       Lajean Saver, MD 09/10/20 1058

## 2020-09-10 NOTE — Discharge Instructions (Addendum)
It was our pleasure to provide your ER care today - we hope that you feel better.  Take acetaminophen as need for pain. You may try robaxin as need for muscle pain/spasm - no driving when taking.   Fall precautions.   Follow up with your doctor/neurosurgery office in the next 1-2 weeks - call office to arrange appointment.   Incidental note was made on your scan of small 5 mm lung nodule and thyroid nodules - likely these are just incidental findings on your imaging study, and will not be of particularly worry or clinical relevance - discuss with your doctor.  Your blood pressure is high this AM - continue your blood pressure meds, and follow up with your doctor in 1-2 weeks.   Return to ER if worse, new symptoms, severe/intractable pain, numbness or weakness, loss of function, or other concern.

## 2020-09-10 NOTE — ED Notes (Signed)
Hurts when she turns her head.

## 2020-09-10 NOTE — ED Notes (Signed)
ED Provider at bedside. 

## 2020-09-10 NOTE — ED Triage Notes (Signed)
Intermittent neck pain radiating to bilateral shoulder for awhile but started last night, the pain is getting worst.

## 2020-09-24 NOTE — Progress Notes (Signed)
NEUROLOGY CONSULTATION NOTE  Jessica Mullins MRN: [id number] DOB: [birth year]  Referring provider: Hollace Kinnier, DO Primary care provider: Hollace Kinnier, DO  Reason for consult:  Right lumbar radiculitis, balance problems, meningioma  Assessment/Plan:   1.  Balance disorder - I think the balance problems are related to bilateral proximal leg weakness due to deconditioning as well as arthritic pain in the lumbar spine.  Due to her arthritis, she cannot consistently exercise and thus becomes more weak in the lower extremities. 2.  Meningioma - I don't suspect it is contributing to her balance problems, although there really isn't much to be done for it as neurosurgery feels risks outweigh benefits, with which I would agree. 3.  Lumbar spondylosis  - if radicular pain is again aggravating, would consider pain management - advised to try and exercise if she can - perhaps try exercising in the wading pool. -  Follow up as needed.   Subjective:  Jessica Mullins is an 87 year old right-handed female with paroxysmal atrial fibrillation, hypertension, hyperlipidemia, and history of orthostatic hypotension, melanoma and breast cancer who follows up for balance problems, right lumbar radiculitis, and meningioma.     Reports balance problems after treatment for breast cancer with chemotherapy and radiation in 2016.  Always feels off-balance.  Legs feel like "rubber" and heavy.  Saw patient in 2019.  Denied low back and leg pain at that time.  Had evidence of neuropathy in the feet. NCV-EMG on 02/27/2018 showed mild chronic L3-4 radiculopathy bilaterally but no evidence of large fiber polyneuropathy. MRI of lumbar spine on 03/22/2018 showed degenerative changes with moderate spinal stenosis at L3-4 and L4-5 and with right greater than left L4-5 foraminal stenosis possibly affecting right L4 nerve and at L5-S1 possibly compressing left L5 nerve.  Physical therapy was recommended.  In 2020, she developed  right buttock pain radiating down right leg with right leg weakness.  MRI of lumbar spine on 05/29/2019 personally reviewed stable compared to 2019.  She underwent right L4 nerve epidural which was effective.  In April 2021, she tripped and struck the back of her head.  MRI of brain with and without contrast on 10/05/2019 personally reviewed showed 2.6 x 2 x 2.7 cm meningioma compressing the right anteriolateral medulla and craniocervical junction without parenchymal edema.  She saw neurosurgery who did not advise surgical intervention.  Last year, she moved to [place]  She started taking part in exercises.  Balance was somewhat improved and did not fall much.  However, her back would act up and she would need to stop exercising.  Balance problems remain her main concern.  Although she has some low back pain she does not have any radicular pain down the legs.    PAST MEDICAL HISTORY: Past Medical History:  Diagnosis Date  . Allergy   . Ankle fracture, left   . Atrial fibrillation [facility])    Per [place] Patient Packet   . Breast cancer ([place]) 1115/16   left   . Bursitis of right shoulder   . Cataract   . Family history of cancer   . Fracture of right wrist   . GERD (gastroesophageal reflux disease)   . Hyperlipidemia   . Hypertension   . Lumbar radiculitis    Per [facility] New Patient Packet   . Meningioma [facility])    Per [facility] New Patient Packet   . Skin cancer 2000   melanoma and basal cell  . Stress fracture    Right  Heel, Per [place] New Patient Packet   . Vaso vagal episode    during preparation for colonoscopy    PAST SURGICAL HISTORY: Past Surgical History:  Procedure Laterality Date  . ABDOMINAL HYSTERECTOMY  1988   TAH/BSO--FIBROIDS  . APPENDECTOMY    . BREAST LUMPECTOMY     B/L--FCS  . BREAST LUMPECTOMY WITH RADIOACTIVE SEED AND SENTINEL LYMPH NODE BIOPSY Left 06/11/2015   Procedure: LEFT BREAST LUMPECTOMY WITH RADIOACTIVE SEED AND LEFT SENTINEL LYMPH NODE MAPPING;  Surgeon: Erroll Luna, MD;  Location: [facility];  Service: General;  Laterality: Left;  . CATARACT EXTRACTION  2015  . COLONOSCOPY  2010  . DG  BONE DENSITY ([place] HX)    . EYE SURGERY Bilateral    cataracts  . fiberadenoma Bilateral 1978, 1980  . GANGLION CYST EXCISION     L  hand  . Lipiflow procedure      MEDICATIONS: Current Outpatient Medications on File Prior to Visit  Medication Sig Dispense Refill  . alendronate (FOSAMAX) 70 MG tablet TAKE 1 TAB ONCE A WEEK, AT LEAST 30 MIN BEFORE 1ST FOOD.DO NOT LIE DOWN FOR 30 MIN AFTER TAKING. 4 tablet 0  . AMBULATORY NON FORMULARY MEDICATION Walker use daily as directed.  Disp 1 Leg pain M79.604 1 each 0  . Biotin 5 MG CAPS Take 5 mg by mouth daily.     . Calcium Carbonate-Vitamin D 600-400 MG-UNIT tablet Take 1 tablet by mouth daily.    . Cholecalciferol (VITAMIN D3) 1000 UNITS CAPS Take 1 capsule by mouth daily.    . cycloSPORINE (RESTASIS) 0.05 % ophthalmic emulsion PLACE 1 DROP INTO BOTH EYES TWO (TWO) TIMES DAILY.    [name] diltiazem (CARDIZEM) 30 MG tablet Take 1 tablet (30 mg total) by mouth 2 (two) times daily. 60 tablet 4  . ELIQUIS 5 MG TABS tablet TAKE 1 TABLET BY MOUTH TWICE DAILY. 60 tablet 2  . flecainide (TAMBOCOR) 50 MG tablet Take 1 tablet (50 mg total) by mouth 2 (two) times daily. 60 tablet 10  . ketoconazole (NIZORAL) 2 % cream APPLY 1 APPLICATION TOPICALLY DAILY AS NEEDED FOR IRRITATION 15 g 1  . methocarbamol (ROBAXIN) 500 MG tablet Take 1 tablet (500 mg total) by mouth 3 (three) times daily as needed (muscle spasm/pain). 15 tablet 0  . Multiple Vitamins-Minerals (PRESERVISION AREDS) TABS Take 2 tablets by mouth daily.    . Omega-3 Fatty Acids (FISH OIL PO) Take 1 tablet by mouth daily.     . polyethylene glycol (MIRALAX / GLYCOLAX) 17 g packet Take 17 g by mouth daily.    . pravastatin (PRAVACHOL) 40 MG tablet TAKE 1 TABLET ONCE DAILY. 90 tablet 0  . Psyllium (METAMUCIL PO) Take 15 mLs by mouth daily.     . Turmeric 500 MG CAPS Take 1  capsule by mouth daily.     . vitamin B-12 (CYANOCOBALAMIN) 1000 MCG tablet Take 1 tablet (1,000 mcg total) by mouth daily.     No current facility-administered medications on file prior to visit.    ALLERGIES: Allergies  Allergen Reactions  . Sulfa Antibiotics Other (See Comments)    As a child, rigid as a stick and not responsive  . Tape Other (See Comments)    Blisters, Please use "paper" tape only for short periods of time  . Glucosamine Forte [Nutritional Supplements] Rash  . Latex Rash    FAMILY HISTORY: Family History  Problem Relation Age of Onset  . Cancer Mother  mets to bone--? primary  . Coronary artery disease Brother        died of M! @ 55  . Diabetes Brother   . Hyperlipidemia Brother   . Hypertension Brother   . Heart disease Brother        chf  . Heart disease Father 56       MI  . Lung cancer Sister 82       former smoker  . Diabetes Maternal Aunt   . Lung cancer Other   . Thrombosis Other        thromboembolism clotting disorder--? father died of ? clot   . Hypertension Son   . High Cholesterol Son     Objective:  Blood pressure 133/79, pulse 69, height 5' 0.5" (1.537 m), weight 160 lb 9.6 oz (72.8 kg), SpO2 95 %. General: No acute distress.  Patient appears well-groomed.   Head:  Normocephalic/atraumatic Eyes:  fundi examined but not visualized Neck: supple, no paraspinal tenderness, full range of motion Back: Mild paraspinal tenderness Heart: regular rate and rhythm Lungs: Clear to auscultation bilaterally. Vascular: No carotid bruits. Neurological Exam: Mental status: alert and oriented to person, place, and time, recent and remote memory intact, fund of knowledge intact, attention and concentration intact, speech fluent and not dysarthric, language intact. Cranial nerves: CN I: not tested CN II: pupils equal, round and reactive to light, visual fields intact CN III, IV, VI:  full range of motion, no nystagmus, no ptosis CN V:  facial sensation intact. CN VII: upper and lower face symmetric CN VIII: hearing intact CN IX, X: gag intact, uvula midline CN XI: sternocleidomastoid and trapezius muscles intact CN XII: tongue midline Bulk & Tone: normal, no fasciculations. Motor:  muscle strength 5-/5 bilateral hip flexion and abduction, otherwise 5/5 throughout Sensation:  Pinprick sensation reduced on dorsum of left foot, otherwise intact.  Vibratory sensation reduced in feet. Deep Tendon Reflexes:  absent throughout,  toes downgoing.   Finger to nose testing:  Without dysmetria.   Heel to shin:  Without dysmetria.   Gait:  Wide-based, may slightly stumble.  Romberg negative.    Thank you for allowing me to take part in the care of this patient.  Metta Clines, DO  CC: Hollace Kinnier, DO

## 2020-09-25 ENCOUNTER — Other Ambulatory Visit: Payer: Self-pay

## 2020-09-25 ENCOUNTER — Ambulatory Visit: Payer: Medicare PPO | Admitting: Neurology

## 2020-09-25 ENCOUNTER — Encounter: Payer: Self-pay | Admitting: Neurology

## 2020-09-25 VITALS — BP 133/79 | HR 69 | Ht 60.5 in | Wt 160.6 lb

## 2020-09-25 DIAGNOSIS — R2689 Other abnormalities of gait and mobility: Secondary | ICD-10-CM

## 2020-09-25 DIAGNOSIS — D320 Benign neoplasm of cerebral meninges: Secondary | ICD-10-CM | POA: Diagnosis not present

## 2020-09-25 DIAGNOSIS — M47816 Spondylosis without myelopathy or radiculopathy, lumbar region: Secondary | ICD-10-CM | POA: Diagnosis not present

## 2020-09-25 NOTE — Patient Instructions (Signed)
I think a lot of the balance problems are due to deconditioning - when your back acts up, you are unable to exercise and move around as much - this causes increased balance problems and leg weakness when you are on your feet.

## 2020-10-12 ENCOUNTER — Telehealth: Payer: Self-pay | Admitting: Neurology

## 2020-10-12 DIAGNOSIS — R29898 Other symptoms and signs involving the musculoskeletal system: Secondary | ICD-10-CM

## 2020-10-12 DIAGNOSIS — M47816 Spondylosis without myelopathy or radiculopathy, lumbar region: Secondary | ICD-10-CM

## 2020-10-12 DIAGNOSIS — R2689 Other abnormalities of gait and mobility: Secondary | ICD-10-CM

## 2020-10-12 NOTE — Telephone Encounter (Signed)
Heather, Clinic Nurse at [facility] called to request orders for: Occupation therapy for home safety assessment and joint protection.  She said they have a group there she'd like to get the patient involved in.  Fax: [phone]

## 2020-10-12 NOTE — Telephone Encounter (Signed)
Called and scheduled the patient for an EMG on 12/09/20

## 2020-10-12 NOTE — Telephone Encounter (Signed)
Telephone call to pt, pt states two days ago her legs just gave out and she feel and hit her chest and her legs are bruised. Pt states that her legs do not feel weak until she takes a step and her just give out.   Please advise.

## 2020-10-12 NOTE — Telephone Encounter (Signed)
Patient called requesting a call back from the nurse. She is continuing to have problems with balance and scheduled follow up for 04/08/21. She's been added to the wait list for sooner availability.

## 2020-10-13 NOTE — Telephone Encounter (Signed)
Order added.

## 2020-10-13 NOTE — Telephone Encounter (Signed)
Yes

## 2020-10-14 ENCOUNTER — Encounter: Payer: Medicare PPO | Admitting: Neurology

## 2020-10-14 ENCOUNTER — Ambulatory Visit: Payer: Medicare PPO | Admitting: Neurology

## 2020-10-14 ENCOUNTER — Other Ambulatory Visit: Payer: Self-pay

## 2020-10-14 DIAGNOSIS — R29898 Other symptoms and signs involving the musculoskeletal system: Secondary | ICD-10-CM

## 2020-10-14 DIAGNOSIS — M5417 Radiculopathy, lumbosacral region: Secondary | ICD-10-CM

## 2020-10-14 NOTE — Procedures (Signed)
[facility]  [address]  [place], [place] [address] Tel: [phone] Fax:  [phone] Test Date:  10/14/2020  Patient: Jessica Mullins DOB: March 26, 1931 Physician: Narda Amber, DO  Sex: Female Height: 5' " Ref Phys: Metta Clines, D.O.  ID#: [id number]   Technician:    Patient Complaints: This is an 87 year old female referred for evaluation of gait unsteadiness.  NCV & EMG Findings: Extensive electrodiagnostic testing of the right lower extremity and additional studies of the left shows:  1. Bilateral sural and superficial peroneal sensory responses are within normal limits. 2. Bilateral peroneal and tibial motor responses are within normal limits. 3. Right tibial H reflex study is within normal limits. 4. Chronic motor axonal loss changes are seen affecting the tibialis anterior and rectus femoris muscles, without accompanied active denervation.  Proximal and deep muscles were unable to be tested as the patient is on anticoagulation therapy.  Impression: 1. Chronic L3-4 radiculopathy affecting bilateral lower extremities, moderate.  Findings have progressed as compared to study on 02/27/2018. 2. There is no evidence of a sensorimotor polyneuropathy affecting the lower extremities.   ___________________________ Narda Amber, DO    Nerve Conduction Studies Anti Sensory Summary Table   Stim Site NR Peak (ms) Norm Peak (ms) P-T Amp (V) Norm P-T Amp  Left Sup Peroneal Anti Sensory (Ant Lat Mall)  32C  12 cm    2.8 <4.6 10.1 >3  Right Sup Peroneal Anti Sensory (Ant Lat Mall)  32C  12 cm    3.2 <4.6 9.0 >3  Left Sural Anti Sensory (Lat Mall)  32C  Calf    2.7 <4.6 13.3 >3  Right Sural Anti Sensory (Lat Mall)  32C  Calf    3.4 <4.6 13.0 >3   Motor Summary Table   Stim Site NR Onset (ms) Norm Onset (ms) O-P Amp (mV) Norm O-P Amp Site1 Site2 Delta-0 (ms) Dist (cm) Vel (m/s) Norm Vel (m/s)  Left Peroneal Motor (Ext Dig Brev)  32C  Ankle    4.9 <6.0  3.1 >2.5 B Fib Ankle 8.0 38.0 48 >40  B Fib    12.9  3.1  Poplt B Fib 1.6 8.0 50 >40  Poplt    14.5  2.7         Right Peroneal Motor (Ext Dig Brev)  32C  Ankle    4.2 <6.0 3.0 >2.5 B Fib Ankle 8.4 39.0 46 >40  B Fib    12.6  2.9  Poplt B Fib 1.5 8.0 53 >40  Poplt    14.1  2.9         Left Tibial Motor (Abd Hall Brev)  32C  Ankle    5.6 <6.0 7.4 >4 Knee Ankle 8.9 40.0 45 >40  Knee    14.5  6.6         Right Tibial Motor (Abd Hall Brev)  32C  Ankle    5.5 <6.0 9.3 >4 Knee Ankle 8.1 41.0 51 >40  Knee    13.6  7.6          H Reflex Studies   NR H-Lat (ms) Lat Norm (ms) L-R H-Lat (ms)  Right Tibial (Gastroc)  32C     32.11 <35    EMG   Side Muscle Ins Act Fibs Psw Fasc Number Recrt Dur Dur. Amp Amp. Poly Poly. Comment  Right AntTibialis Nml Nml Nml Nml 1- Rapid Some 1+ Some 1+ Some 1+ N/A  Right Gastroc Nml Nml Nml Nml  Nml Nml Nml Nml Nml Nml Nml Nml N/A  Right RectFemoris Nml Nml Nml Nml 2- Rapid Many 1+ Many 1+ Many 1+ N/A  Left AntTibialis Nml Nml Nml Nml 1- Rapid Some 1+ Some 1+ Some 1+ N/A  Left Gastroc Nml Nml Nml Nml Nml Nml Nml Nml Nml Nml Nml Nml N/A  Left RectFemoris Nml Nml Nml Nml 2- Rapid Many 1+ Many 1+ Many 1+ N/A      Waveforms:

## 2020-10-16 ENCOUNTER — Telehealth: Payer: Self-pay

## 2020-10-16 DIAGNOSIS — R29898 Other symptoms and signs involving the musculoskeletal system: Secondary | ICD-10-CM

## 2020-10-16 DIAGNOSIS — M5417 Radiculopathy, lumbosacral region: Secondary | ICD-10-CM

## 2020-10-16 NOTE — Telephone Encounter (Signed)
-----   Message from Pieter Partridge, DO sent at 10/15/2020  4:56 PM EDT ----- Nerve study shows evidence of old pinched nerve in the lower back that seems progressed compared to prior study in 2019.  I would like to get MRI of lumbar spine without contrast for lumbar radiculopathy, bilateral leg weakness and frequent falls.

## 2020-10-16 NOTE — Telephone Encounter (Signed)
Pt called and informed that Nerve study shows evidence of old pinched nerve in the lower back that seems progressed compared to prior study in 2019 . MRI of lumbar spine ordered pt advised that [facility] will call her to get it scheduled

## 2020-10-19 ENCOUNTER — Other Ambulatory Visit: Payer: Self-pay | Admitting: Hematology and Oncology

## 2020-10-19 DIAGNOSIS — H26491 Other secondary cataract, right eye: Secondary | ICD-10-CM | POA: Diagnosis not present

## 2020-10-19 DIAGNOSIS — H35033 Hypertensive retinopathy, bilateral: Secondary | ICD-10-CM | POA: Diagnosis not present

## 2020-10-19 DIAGNOSIS — I4819 Other persistent atrial fibrillation: Secondary | ICD-10-CM

## 2020-10-19 DIAGNOSIS — H353132 Nonexudative age-related macular degeneration, bilateral, intermediate dry stage: Secondary | ICD-10-CM | POA: Diagnosis not present

## 2020-10-21 ENCOUNTER — Non-Acute Institutional Stay: Payer: Medicare PPO | Admitting: Internal Medicine

## 2020-10-21 ENCOUNTER — Encounter: Payer: Self-pay | Admitting: Internal Medicine

## 2020-10-21 ENCOUNTER — Other Ambulatory Visit: Payer: Self-pay

## 2020-10-21 VITALS — BP 132/74 | HR 63 | Temp 96.9°F | Ht 60.5 in | Wt 161.4 lb

## 2020-10-21 DIAGNOSIS — M5416 Radiculopathy, lumbar region: Secondary | ICD-10-CM | POA: Diagnosis not present

## 2020-10-21 DIAGNOSIS — M8580 Other specified disorders of bone density and structure, unspecified site: Secondary | ICD-10-CM | POA: Diagnosis not present

## 2020-10-21 DIAGNOSIS — E7800 Pure hypercholesterolemia, unspecified: Secondary | ICD-10-CM

## 2020-10-21 DIAGNOSIS — I480 Paroxysmal atrial fibrillation: Secondary | ICD-10-CM | POA: Diagnosis not present

## 2020-10-21 DIAGNOSIS — W19XXXA Unspecified fall, initial encounter: Secondary | ICD-10-CM | POA: Diagnosis not present

## 2020-10-21 DIAGNOSIS — D329 Benign neoplasm of meninges, unspecified: Secondary | ICD-10-CM

## 2020-10-21 DIAGNOSIS — I10 Essential (primary) hypertension: Secondary | ICD-10-CM | POA: Diagnosis not present

## 2020-10-21 MED ORDER — TETANUS-DIPHTH-ACELL PERTUSSIS 5-2.5-18.5 LF-MCG/0.5 IM SUSP: 0.5000 mL | Freq: Once | INTRAMUSCULAR | 0 refills | Status: AC

## 2020-10-21 NOTE — Progress Notes (Signed)
Location:  [facility] of Service:  Clinic (12)  Provider:   Code Status:  Goals of Care:  Advanced Directives 09/25/2020  Does Patient Have a Medical Advance Directive? Yes  Type of Paramedic of [place];Living will  Does patient want to make changes to medical advance directive? -  Copy of [place] in Chart? -  Would patient like information on creating a medical advance directive? -  Pre-existing out of facility DNR order (yellow form or pink MOST form) -     Chief Complaint  Patient presents with  . Medical Management of Chronic Issues    Patient returns to the clinic for 3 month follow up. She believes she has a pinched nerve in her back and will be seeing a neurologist soon for an MRI.     HPI: Patient is a 87 y.o. female seen today for medical management of chronic diseases.    Patient has h/o Meningoma Diagnosed with MRI in 4/21 Has seen Neurosurgery H/o Low Back Pain with Radiculopathy follows with Neurology H/o Breast Cancer Invasive Ductal Carcinoma diagnosed in 2016 Osteopenia Last DEXA was -1.6 in 2020 H/o PAF  Follows with Cardiology On Flecainide and Eliquis Also h/o Bradycardia Hypertension with Tendency for Orthostatic Hypotension  She came today for follow up She has h/o Recurrent falls when she was by herself in her home but says she has not had any falls since she moved to [place] But 2 weeks ago she had a fall. She says she was standing without her walker and suddenly fel her legs feeling weak and she slowly went down No Dizziness or LOC. She was aware she is going down. She did call Nurse. Did sustain a bruise in her Left Hand. No Other Injuries Back to her baseline again now Has done therapy before for her Balance but not since she has moved here  Her other complain include Back pain and stiffness which is Chronic Has MRI due tomorrow with Follow up with Dr Tomi Likens Cognitively  doing well Walks with her walker now.   Past Medical History:  Diagnosis Date  . Allergy   . Ankle fracture, left   . Atrial fibrillation [facility])    Per [place] Patient Packet   . Breast cancer ([place]) 1115/16   left   . Bursitis of right shoulder   . Cataract   . Family history of cancer   . Fracture of right wrist   . GERD (gastroesophageal reflux disease)   . Hyperlipidemia   . Hypertension   . Lumbar radiculitis    Per [facility] New Patient Packet   . Meningioma [facility])    Per [facility] New Patient Packet   . Skin cancer 2000   melanoma and basal cell  . Stress fracture    Right Heel, Per [place] New Patient Packet   . Vaso vagal episode    during preparation for colonoscopy    Past Surgical History:  Procedure Laterality Date  . ABDOMINAL HYSTERECTOMY  1988   TAH/BSO--FIBROIDS  . APPENDECTOMY    . BREAST LUMPECTOMY     B/L--[facility]CS  . BREAST LUMPECTOMY WITH RADIOACTIVE SEED AND SENTINEL LYMPH NODE BIOPSY Left 06/11/2015   Procedure: LEFT BREAST LUMPECTOMY WITH RADIOACTIVE SEED AND LEFT SENTINEL LYMPH NODE MAPPING;  Surgeon: Erroll Luna, MD;  Location: [place];  Service: General;  Laterality: Left;  . CATARACT EXTRACTION  2015  . COLONOSCOPY  2010  . DG  BONE DENSITY ([place] HX)    . EYE SURGERY Bilateral    cataracts  . fiberadenoma Bilateral 1978, 1980  . GANGLION CYST EXCISION     L  hand  . Lipiflow procedure      Allergies  Allergen Reactions  . Sulfa Antibiotics Other (See Comments)    As a child, rigid as a stick and not responsive  . Tape Other (See Comments)    Blisters, Please use "paper" tape only for short periods of time  . Glucosamine Forte [Nutritional Supplements] Rash  . Latex Rash    Outpatient Encounter Medications as of 10/21/2020  Medication Sig  . AMBULATORY NON FORMULARY MEDICATION Walker use daily as directed.  Disp 1 Leg pain M79.604  . Biotin 5 MG CAPS Take 5 mg by mouth daily.   . Calcium Carbonate-Vitamin D 600-400 MG-UNIT tablet Take 1 tablet by  mouth daily.  . Cholecalciferol (VITAMIN D3) 1000 UNITS CAPS Take 1 capsule by mouth daily.  . cycloSPORINE (RESTASIS) 0.05 % ophthalmic emulsion PLACE 1 DROP INTO BOTH EYES TWO (TWO) TIMES DAILY.  [name] diltiazem (CARDIZEM) 30 MG tablet Take 1 tablet (30 mg total) by mouth 2 (two) times daily.  [name] ELIQUIS 5 MG TABS tablet TAKE 1 TABLET BY MOUTH TWICE DAILY.  . flecainide (TAMBOCOR) 50 MG tablet Take 1 tablet (50 mg total) by mouth 2 (two) times daily.  [name] ketoconazole (NIZORAL) 2 % cream APPLY 1 APPLICATION TOPICALLY DAILY AS NEEDED FOR IRRITATION  . methocarbamol (ROBAXIN) 500 MG tablet Take 1 tablet (500 mg total) by mouth 3 (three) times daily as needed (muscle spasm/pain).  . Multiple Vitamins-Minerals (PRESERVISION AREDS) TABS Take 2 tablets by mouth daily.  . Omega-3 Fatty Acids (FISH OIL PO) Take 1 tablet by mouth daily.   . polyethylene glycol (MIRALAX / GLYCOLAX) 17 g packet Take 17 g by mouth daily.  . pravastatin (PRAVACHOL) 40 MG tablet TAKE 1 TABLET ONCE DAILY.  [name] Psyllium (METAMUCIL PO) Take 15 mLs by mouth daily.   . Turmeric 500 MG CAPS Take 1 capsule by mouth daily.   . vitamin B-12 (CYANOCOBALAMIN) 1000 MCG tablet Take 1 tablet (1,000 mcg total) by mouth daily.  . [DISCONTINUED] Tdap (BOOSTRIX) 5-2.5-18.5 LF-MCG/0.5 injection Inject 0.5 mLs into the muscle once.  . Tdap (BOOSTRIX) 5-2.5-18.5 LF-MCG/0.5 injection Inject 0.5 mLs into the muscle once for 1 dose.   No facility-administered encounter medications on file as of 10/21/2020.    Review of Systems:  Review of Systems  Constitutional: Positive for activity change.  HENT: Negative.   Respiratory: Negative.   Cardiovascular: Negative.   Gastrointestinal: Negative.   Genitourinary: Negative.   Musculoskeletal: Positive for back pain.  Neurological: Negative for dizziness.  Psychiatric/Behavioral: Negative.     Health Maintenance  Topic Date Due  . COVID-19 Vaccine (4 - Booster for Pfizer series) 09/27/2020  .  INFLUENZA VACCINE  01/25/2021  . MAMMOGRAM  05/26/2021  . TETANUS/TDAP  09/18/2021  . DEXA SCAN  Completed  . PNA vac Low Risk Adult  Completed  . HPV VACCINES  Aged Out    Physical Exam: Vitals:   10/21/20 0934  BP: 132/74  Pulse: 63  Temp: (!) 96.9 F (36.1 C)  SpO2: 97%  Weight: 161 lb 6.4 oz (73.2 kg)  Height: 5' 0.5" (1.537 m)   Body mass index is 31 kg/m. Physical Exam  Constitutional: Oriented to person, place, and time. Well-developed and well-nourished.  HENT:  Head: Normocephalic.  Mouth/Throat: Oropharynx is clear and  moist.  Eyes: Pupils are equal, round, and reactive to light.  Neck: Neck supple.  Cardiovascular: Normal rate and normal heart sounds.  No murmur heard.Irregular Rythm Pulmonary/Chest: Effort normal and breath sounds normal. No respiratory distress. No wheezes. She has no rales.  Abdominal: Soft. Bowel sounds are normal. No distension. There is no tenderness. There is no rebound.  Musculoskeletal: No edema.  Lymphadenopathy: none Neurological: Alert and oriented to person, place, and time.  Skin: Skin is warm and dry. Bruise on inner Side of her Arm Psychiatric: Normal mood and affect. Behavior is normal. Thought content normal.   Labs reviewed: Basic Metabolic Panel: Recent Labs    11/19/19 1403 07/16/20 0000  NA 141 142  K 3.9 4.4  CL 103 103  CO2 32 31*  GLUCOSE 93  --   BUN 16 15  CREATININE 0.71 0.7  CALCIUM 10.1 10.4   Liver Function Tests: Recent Labs    11/19/19 1403 07/16/20 0000  AST 16 19  ALT 16 19  ALKPHOS 67  --   BILITOT 0.5  --   PROT 6.0  --   ALBUMIN 4.2 4.4   No results for input(s): LIPASE, AMYLASE in the last 8760 hours. No results for input(s): AMMONIA in the last 8760 hours. CBC: Recent Labs    11/19/19 1403 07/16/20 0000  WBC 6.7 6.1  NEUTROABS 4.7  --   HGB 14.6 15.5  HCT 42.8  --   MCV 94.1  --   PLT 213.0  --    Lipid Panel: Recent Labs    11/19/19 1403 07/16/20 0000  CHOL 155 152   HDL 61.40 74*  LDLCALC 76 65  TRIG 89.0 69  CHOLHDL 3  --    No results found for: HGBA1C  Procedures since last visit: NCV with EMG(electromyography)  Result Date: 10/14/2020 Alda Berthold, DO     10/14/2020 12:46 PM [facility] [address], [address]  [place], [place] [address] Tel: [phone] Fax:  [phone] Test Date:  10/14/2020 Patient: Raynetta Lamarca DOB: [birth year] Physician: Narda Amber, DO Sex: Female Height: 5' " Ref Phys: Metta Clines, D.O. ID#: [id number]   Technician:  Patient Complaints: This is an 87 year old female referred for evaluation of gait unsteadiness. NCV & EMG Findings: Extensive electrodiagnostic testing of the right lower extremity and additional studies of the left shows: 1. Bilateral sural and superficial peroneal sensory responses are within normal limits. 2. Bilateral peroneal and tibial motor responses are within normal limits. 3. Right tibial H reflex study is within normal limits. 4. Chronic motor axonal loss changes are seen affecting the tibialis anterior and rectus femoris muscles, without accompanied active denervation.  Proximal and deep muscles were unable to be tested as the patient is on anticoagulation therapy. Impression: 1. Chronic L3-4 radiculopathy affecting bilateral lower extremities, moderate.  Findings have progressed as compared to study on 02/27/2018. 2. There is no evidence of a sensorimotor polyneuropathy affecting the lower extremities. ___________________________ Narda Amber, DO Nerve Conduction Studies Anti Sensory Summary Table  Stim Site NR Peak (ms) Norm Peak (ms) P-T Amp (V) Norm P-T Amp Left Sup Peroneal Anti Sensory (Ant Lat Mall)  32C 12 cm    2.8 <4.6 10.1 >3 Right Sup Peroneal Anti Sensory (Ant Lat Mall)  32C 12 cm    3.2 <4.6 9.0 >3 Left Sural Anti Sensory (Lat Mall)  32C Calf    2.7 <4.6 13.3 >3 Right Sural Anti Sensory (Lat Mall)  32C Calf  3.4 <4.6 13.0 >3 Motor Summary Table  Stim Site NR Onset (ms) Norm  Onset (ms) O-P Amp (mV) Norm O-P Amp Site1 Site2 Delta-0 (ms) Dist (cm) Vel (m/s) Norm Vel (m/s) Left Peroneal Motor (Ext Dig Brev)  32C Ankle    4.9 <6.0 3.1 >2.5 B Fib Ankle 8.0 38.0 48 >40 B Fib    12.9  3.1  Poplt B Fib 1.6 8.0 50 >40 Poplt    14.5  2.7        Right Peroneal Motor (Ext Dig Brev)  32C Ankle    4.2 <6.0 3.0 >2.5 B Fib Ankle 8.4 39.0 46 >40 B Fib    12.6  2.9  Poplt B Fib 1.5 8.0 53 >40 Poplt    14.1  2.9        Left Tibial Motor (Abd Hall Brev)  32C Ankle    5.6 <6.0 7.4 >4 Knee Ankle 8.9 40.0 45 >40 Knee    14.5  6.6        Right Tibial Motor (Abd Hall Brev)  32C Ankle    5.5 <6.0 9.3 >4 Knee Ankle 8.1 41.0 51 >40 Knee    13.6  7.6        H Reflex Studies  NR H-Lat (ms) Lat Norm (ms) L-R H-Lat (ms) Right Tibial (Gastroc)  32C    32.11 <35  EMG  Side Muscle Ins Act Fibs Psw Fasc Number Recrt Dur Dur. Amp Amp. Poly Poly. Comment Right AntTibialis Nml Nml Nml Nml 1- Rapid Some 1+ Some 1+ Some 1+ N/A Right Gastroc Nml Nml Nml Nml Nml Nml Nml Nml Nml Nml Nml Nml N/A Right RectFemoris Nml Nml Nml Nml 2- Rapid Many 1+ Many 1+ Many 1+ N/A Left AntTibialis Nml Nml Nml Nml 1- Rapid Some 1+ Some 1+ Some 1+ N/A Left Gastroc Nml Nml Nml Nml Nml Nml Nml Nml Nml Nml Nml Nml N/A Left RectFemoris Nml Nml Nml Nml 2- Rapid Many 1+ Many 1+ Many 1+ N/A Waveforms:             Assessment/Plan 1. Fall, initial encounter Unclear Etioloy  Does have MRI schedule tomorrow and Then follow up with Neurology I have Encouraged her to make follow up with Cardiology Refuses Therapy right now. We did d/w her how it can help her Endurance and keep her Gilford Rile with her all the time  2. Right lumbar radiculitis Does not take Anything Not even Robaxin. Takes it sometimes probably 2/month   3. Senile osteopenia On Vit D and Calcium  4. Meningioma [facility]) Follows with Neurosurgery  5. Paroxysmal atrial fibrillation (HCC) On Eliquis and Short acting Cardizem to avoid Hypotension Also on Flecainide  6. Pure  hypercholesterolemia On Pravachol Lat LDL 65 in 1/22  7. Essential hypertension Loose Control due to her h/o Falls    Labs/tests ordered:  CBC,lipid,TSH,CMP  Next appt:  01/22/2021

## 2020-10-22 ENCOUNTER — Other Ambulatory Visit: Payer: Self-pay

## 2020-10-22 ENCOUNTER — Ambulatory Visit
Admission: RE | Admit: 2020-10-22 | Discharge: 2020-10-22 | Disposition: A | Discharge status: Home / Self Care | Payer: Medicare PPO | Source: Ambulatory Visit | Attending: Neurology | Admitting: Neurology

## 2020-10-22 DIAGNOSIS — R29898 Other symptoms and signs involving the musculoskeletal system: Secondary | ICD-10-CM

## 2020-10-22 DIAGNOSIS — M5417 Radiculopathy, lumbosacral region: Secondary | ICD-10-CM

## 2020-10-22 DIAGNOSIS — M48061 Spinal stenosis, lumbar region without neurogenic claudication: Secondary | ICD-10-CM | POA: Diagnosis not present

## 2020-10-23 ENCOUNTER — Telehealth: Payer: Self-pay

## 2020-10-23 DIAGNOSIS — M47816 Spondylosis without myelopathy or radiculopathy, lumbar region: Secondary | ICD-10-CM

## 2020-10-23 DIAGNOSIS — M5417 Radiculopathy, lumbosacral region: Secondary | ICD-10-CM

## 2020-10-23 NOTE — Telephone Encounter (Signed)
-----   Message from Pieter Partridge, DO sent at 10/22/2020  8:27 PM EDT ----- MRI does show progression of arthritis with pressing of nerves compared to prior imaging.  I think this needs to be evaluated by a spine surgeon.

## 2020-10-23 NOTE — Telephone Encounter (Signed)
Referral added. Per pt request Mychart message sent with information of referral and Dr.Jaffe results note

## 2020-10-26 ENCOUNTER — Telehealth: Payer: Self-pay | Admitting: Cardiovascular Disease

## 2020-10-26 ENCOUNTER — Telehealth: Payer: Self-pay | Admitting: Neurology

## 2020-10-26 NOTE — Telephone Encounter (Signed)
Len Childs, Was [place] of the patient you called this morning. If not then she may have listened to an old Message from last.

## 2020-10-26 NOTE — Telephone Encounter (Signed)
New message    Patient returning call back to the office - voiced someone left a message on home answering machine.

## 2020-10-26 NOTE — Telephone Encounter (Signed)
Jessica Mullins is calling stating she has been experiencing her legs giving out causing her to fall for the past 2-3 weeks. She states it has occurred twice since reoccurring and she does not feel any symptoms prior to the occurrence. She reports she does not feel any dizziness nor does she faint. Her legs just give out, after taking a moment to ensure nothing has been broken from the fall she is able to get right back up. An appt has been scheduled for the first available on 11/24/20 with [facility]. Pt has also been added to Dr. Victorino December wait list. Please advise.

## 2020-10-26 NOTE — Telephone Encounter (Signed)
No she was not one of the ones that I called today

## 2020-10-26 NOTE — Telephone Encounter (Signed)
Spoke to pt who report roughly a year ago, she was experiencing episodes where her legs felt like rubber and would give out. Pt state since moving into [place], symptoms got better but for the past 2-3 weeks has reoccurred. On average pt state symptoms happen at least 2-3 times a week. She denies dizziness, blacking out, or any other symptoms.  Pt state she was seen by a neurologist last week and has an assessment with a nurse from [place] this week but wanted to make Dr. Loletha Grayer aware.  Will forward to MD

## 2020-10-28 DIAGNOSIS — H903 Sensorineural hearing loss, bilateral: Secondary | ICD-10-CM | POA: Insufficient documentation

## 2020-11-04 NOTE — Progress Notes (Signed)
NEUROLOGY FOLLOW UP OFFICE NOTE  Macaela L Magar [phone]  Assessment/Plan:   Balance disorder secondary to advanced degenerative disease of the lumbar spine with spinal and foraminal stenosis.  Recommend to continue exercise as directed by physical therapy.  Continue use of walker. Follow up as needed.    Subjective:  Jessica Mullins is an 9 year oldright-handed female with paroxysmal atrial fibrillation, hypertension, hyperlipidemia, and history of orthostatic hypotension, melanoma and breast cancer who follows up for balance problems, right lumbar radiculitis, and meningioma.    UPDATE: NCV-EMG of the lower extremities on 10/14/2020 again showed bilateral chronic L3-4 radiculopathy but progressed compared to prior study on 02/27/2018.  No evidence of polyneuropathy or myopathy.  Follow up MRI of lumbar spine on 10/22/2020 personally reviewed demonstrated advanced lumbar spine degeneration with progressed spinal stenosis at L3-4 with right greater than left foraminal impingement, moderate spinal stenosis at L4-5 with impingement of both L5 nerve roots and severe right foraminal impingement, and left L5-S1 left foraminal impingement.  HISTORY: Reports balance problems after treatment for breast cancer with chemotherapy and radiation in 2016.  Always feels off-balance.  Legs feel like "rubber" and heavy.  Saw patient in 2019.  Denied low back and leg pain at that time.  Had evidence of neuropathy in the feet. NCV-EMG on 02/27/2018 showed mild chronic L3-4 radiculopathy bilaterally but no evidence of large fiber polyneuropathy. MRI of lumbar spine on 03/22/2018 showed degenerative changes with moderate spinal stenosis at L3-4 and L4-5 and with right greater than left L4-5 foraminal stenosis possibly affecting right L4 nerve and at L5-S1 possibly compressing left L5 nerve.  Physical therapy was recommended.  In 2020, she developed right buttock pain radiating down right leg with right leg  weakness.  MRI of lumbar spine on 05/29/2019 personally reviewed stable compared to 2019.  She underwent right L4 nerve epidural which was effective.  In April 2021, she tripped and struck the back of her head.  MRI of brain with and without contrast on 10/05/2019 personally reviewed showed 2.6 x 2 x 2.7 cm meningioma compressing the right anteriolateral medulla and craniocervical junction without parenchymal edema.  She saw neurosurgery who did not advise surgical intervention.  Last year, she moved to [place]  She started taking part in exercises.  Balance was somewhat improved and did not fall much.  However, her back would act up and she would need to stop exercising.  Balance problems remain her main concern.  Although she has some low back pain she does not have any radicular pain down the legs.    PAST MEDICAL HISTORY: Past Medical History:  Diagnosis Date  . Allergy   . Ankle fracture, left   . Atrial fibrillation [facility])    Per [place] Patient Packet   . Breast cancer ([place]) 1115/16   left   . Bursitis of right shoulder   . Cataract   . Family history of cancer   . Fracture of right wrist   . GERD (gastroesophageal reflux disease)   . Hyperlipidemia   . Hypertension   . Lumbar radiculitis    Per [facility] New Patient Packet   . Meningioma [facility])    Per [facility] New Patient Packet   . Skin cancer 2000   melanoma and basal cell  . Stress fracture    Right Heel, Per [place] New Patient Packet   . Vaso vagal episode    during preparation for colonoscopy    MEDICATIONS: Current Outpatient Medications on File  Prior to Visit  Medication Sig Dispense Refill  . AMBULATORY NON FORMULARY MEDICATION Walker use daily as directed.  Disp 1 Leg pain M79.604 1 each 0  . Biotin 5 MG CAPS Take 5 mg by mouth daily.     . Calcium Carbonate-Vitamin D 600-400 MG-UNIT tablet Take 1 tablet by mouth daily.    . Cholecalciferol (VITAMIN D3) 1000 UNITS CAPS Take 1 capsule by mouth daily.    . cycloSPORINE  (RESTASIS) 0.05 % ophthalmic emulsion PLACE 1 DROP INTO BOTH EYES TWO (TWO) TIMES DAILY.    [name] diltiazem (CARDIZEM) 30 MG tablet Take 1 tablet (30 mg total) by mouth 2 (two) times daily. 60 tablet 4  . ELIQUIS 5 MG TABS tablet TAKE 1 TABLET BY MOUTH TWICE DAILY. 60 tablet 2  . flecainide (TAMBOCOR) 50 MG tablet Take 1 tablet (50 mg total) by mouth 2 (two) times daily. 60 tablet 10  . ketoconazole (NIZORAL) 2 % cream APPLY 1 APPLICATION TOPICALLY DAILY AS NEEDED FOR IRRITATION 15 g 1  . methocarbamol (ROBAXIN) 500 MG tablet Take 1 tablet (500 mg total) by mouth 3 (three) times daily as needed (muscle spasm/pain). 15 tablet 0  . Multiple Vitamins-Minerals (PRESERVISION AREDS) TABS Take 2 tablets by mouth daily.    . Omega-3 Fatty Acids (FISH OIL PO) Take 1 tablet by mouth daily.     . polyethylene glycol (MIRALAX / GLYCOLAX) 17 g packet Take 17 g by mouth daily.    . pravastatin (PRAVACHOL) 40 MG tablet TAKE 1 TABLET ONCE DAILY. 90 tablet 0  . Psyllium (METAMUCIL PO) Take 15 mLs by mouth daily.     . Turmeric 500 MG CAPS Take 1 capsule by mouth daily.     . vitamin B-12 (CYANOCOBALAMIN) 1000 MCG tablet Take 1 tablet (1,000 mcg total) by mouth daily.     No current facility-administered medications on file prior to visit.    ALLERGIES: Allergies  Allergen Reactions  . Sulfa Antibiotics Other (See Comments)    As a child, rigid as a stick and not responsive  . Tape Other (See Comments)    Blisters, Please use "paper" tape only for short periods of time  . Glucosamine Forte [Nutritional Supplements] Rash  . Latex Rash    FAMILY HISTORY: Family History  Problem Relation Age of Onset  . Cancer Mother        mets to bone--? primary  . Coronary artery disease Brother        died of M! @ 48  . Diabetes Brother   . Hyperlipidemia Brother   . Hypertension Brother   . Heart disease Brother        chf  . Heart disease Father 11       MI  . Lung cancer Sister 10       former smoker  .  Diabetes Maternal Aunt   . Lung cancer Other   . Thrombosis Other        thromboembolism clotting disorder--? father died of ? clot   . Hypertension Son   . High Cholesterol Son       Objective:  Blood pressure (!) 144/76, pulse 67, resp. rate 20, height 5\' 5"  (1.651 m), weight 160 lb (72.6 kg), SpO2 97 %. General: No acute distress.  Patient appears well-groomed.     Metta Clines, DO  CC: Veleta Miners, MD

## 2020-11-05 ENCOUNTER — Ambulatory Visit: Payer: Medicare PPO | Admitting: Neurology

## 2020-11-05 ENCOUNTER — Other Ambulatory Visit: Payer: Self-pay

## 2020-11-05 ENCOUNTER — Encounter: Payer: Self-pay | Admitting: Neurology

## 2020-11-05 VITALS — BP 144/76 | HR 67 | Resp 20 | Ht 65.0 in | Wt 160.0 lb

## 2020-11-05 DIAGNOSIS — M48061 Spinal stenosis, lumbar region without neurogenic claudication: Secondary | ICD-10-CM | POA: Diagnosis not present

## 2020-11-05 NOTE — Patient Instructions (Signed)
I think that your balance problems are related to arthritis in your lower back that can press on the nerves (lumbar spinal stenosis) - it can cause weakness and balance problems.

## 2020-11-16 ENCOUNTER — Other Ambulatory Visit: Payer: Self-pay | Admitting: Hematology and Oncology

## 2020-11-16 DIAGNOSIS — E785 Hyperlipidemia, unspecified: Secondary | ICD-10-CM

## 2020-11-17 ENCOUNTER — Other Ambulatory Visit: Payer: Self-pay | Admitting: Internal Medicine

## 2020-11-17 DIAGNOSIS — E785 Hyperlipidemia, unspecified: Secondary | ICD-10-CM

## 2020-11-24 ENCOUNTER — Encounter: Payer: Self-pay | Admitting: Physician Assistant

## 2020-11-24 ENCOUNTER — Other Ambulatory Visit: Payer: Self-pay

## 2020-11-24 ENCOUNTER — Ambulatory Visit: Payer: Medicare PPO | Admitting: Physician Assistant

## 2020-11-24 VITALS — BP 130/70 | HR 60 | Resp 18 | Ht 60.0 in | Wt 160.2 lb

## 2020-11-24 DIAGNOSIS — R4781 Slurred speech: Secondary | ICD-10-CM

## 2020-11-24 DIAGNOSIS — I10 Essential (primary) hypertension: Secondary | ICD-10-CM

## 2020-11-24 DIAGNOSIS — I480 Paroxysmal atrial fibrillation: Secondary | ICD-10-CM | POA: Diagnosis not present

## 2020-11-24 DIAGNOSIS — E785 Hyperlipidemia, unspecified: Secondary | ICD-10-CM

## 2020-11-24 NOTE — Progress Notes (Signed)
Cardiology Office Note:    Date:  11/25/2020   ID:  Jessica Mullins, DOB 1930/08/27, MRN [id number]  PCP:  Virgie Dad, MD   [facility] Providers Cardiologist:  Sanda Klein, MD     Referring MD: Virgie Dad, MD   Chief Complaint  Patient presents with  . Follow-up    Seen for Dr. Sallyanne Kuster    History of Present Illness:    Jessica Mullins is a 87 y.o. female with a hx of PAF, remote history of vasovagal syncope, GERD, hyperlipidemia, hypertension, breast cancer and history of fatigue.  She had a heart monitor in [name] that showed a tendency to bradycardia with heart rate frequently in the 50s, borderline criteria for chronotropic incompetence.  Average heart rate was 64 bpm.  Maximum achieved heart rate was 103 bpm which was 75% predicted for her age. Since her atrial fibrillation has been infrequent while on diltiazem and the flecainide, it was decided to decrease the dosage of her diltiazem to see if this will improve her fatigue.  There is some concern that in the future she likely will develop tachy-brady syndrome.  Based on the most recent phone note on 10/26/2020, patient has moved into [facility] facility.  Since she moved there, her fatigue symptom has initially improved, however recurred in the past month.  She was having some balance issues and was seen by neurology service Dr. Tomi Likens who felt her balance disorder may be related to advanced degenerative disease in the lumbar spine with spinal and foraminal stenosis.  It was recommended to continue exercise as directed by physical therapy.  Patient presents today for follow-up.  She denies any chest pain or worsening dyspnea.  She does complain of occasional slurred speech.  Full neurological examination shows no focal deficit.  Muscle strength equal on both sides symmetrically.  I recommend that she follow-up with her neurologist regarding the issue. She has been compliant with Eliquis.  She does have  occasional vision changes, however no dizziness, blurred vision or feeling of passing out to suggest its relationship with bradycardia.  He says when he focused on the flower pedals, sometimes their color become more pale.  This is more consistent with ophthalmological issue rather than cardiac issue.  She continued to have balance problem due to bilateral lower extremity weakness.   Past Medical History:  Diagnosis Date  . Allergy   . Ankle fracture, left   . Atrial fibrillation [facility])    Per [place] Patient Packet   . Breast cancer ([place]) 1115/16   left   . Bursitis of right shoulder   . Cataract   . Family history of cancer   . Fracture of right wrist   . GERD (gastroesophageal reflux disease)   . Hyperlipidemia   . Hypertension   . Lumbar radiculitis    Per [facility] New Patient Packet   . Meningioma [facility])    Per [facility] New Patient Packet   . Skin cancer 2000   melanoma and basal cell  . Stress fracture    Right Heel, Per [place] New Patient Packet   . Vaso vagal episode    during preparation for colonoscopy    Past Surgical History:  Procedure Laterality Date  . ABDOMINAL HYSTERECTOMY  1988   TAH/BSO--FIBROIDS  . APPENDECTOMY    . BREAST LUMPECTOMY     B/L--[facility]  . BREAST LUMPECTOMY WITH RADIOACTIVE SEED AND SENTINEL LYMPH NODE BIOPSY Left 06/11/2015   Procedure: LEFT BREAST LUMPECTOMY  WITH RADIOACTIVE SEED AND LEFT SENTINEL LYMPH NODE MAPPING;  Surgeon: Erroll Luna, MD;  Location: [place];  Service: General;  Laterality: Left;  . CATARACT EXTRACTION  2015  . COLONOSCOPY  2010  . DG  BONE DENSITY ([place] [facility])    . EYE SURGERY Bilateral    cataracts  . fiberadenoma Bilateral 1978, 1980  . GANGLION CYST EXCISION     L  hand  . Lipiflow procedure      Current Medications: Current Meds  Medication Sig  . AMBULATORY NON FORMULARY MEDICATION Walker use daily as directed.  Disp 1 Leg pain M79.604  . Biotin 5 MG CAPS Take 5 mg by mouth daily.   . Calcium Carbonate-Vitamin D  600-400 MG-UNIT tablet Take 1 tablet by mouth daily.  . Cholecalciferol (VITAMIN D3) 1000 UNITS CAPS Take 1 capsule by mouth daily.  . cycloSPORINE (RESTASIS) 0.05 % ophthalmic emulsion PLACE 1 DROP INTO BOTH EYES TWO (TWO) TIMES DAILY.  [name] diltiazem (CARDIZEM) 30 MG tablet Take 1 tablet (30 mg total) by mouth 2 (two) times daily.  [name] ELIQUIS 5 MG TABS tablet TAKE 1 TABLET BY MOUTH TWICE DAILY.  . flecainide (TAMBOCOR) 50 MG tablet Take 1 tablet (50 mg total) by mouth 2 (two) times daily.  [name] ketoconazole (NIZORAL) 2 % cream APPLY 1 APPLICATION TOPICALLY DAILY AS NEEDED FOR IRRITATION  . methocarbamol (ROBAXIN) 500 MG tablet Take 1 tablet (500 mg total) by mouth 3 (three) times daily as needed (muscle spasm/pain).  . Multiple Vitamins-Minerals (PRESERVISION AREDS) TABS Take 2 tablets by mouth daily.  . Omega-3 Fatty Acids (FISH OIL PO) Take 1 tablet by mouth daily.   . polyethylene glycol (MIRALAX / GLYCOLAX) 17 g packet Take 17 g by mouth daily.  . pravastatin (PRAVACHOL) 40 MG tablet TAKE 1 TABLET ONCE DAILY.  [name] Psyllium (METAMUCIL PO) Take 15 mLs by mouth daily.   . Turmeric 500 MG CAPS Take 1 capsule by mouth daily.   . vitamin B-12 (CYANOCOBALAMIN) 1000 MCG tablet Take 1 tablet (1,000 mcg total) by mouth daily.     Allergies:   Sulfa antibiotics, Tape, Glucosamine forte [nutritional supplements], and Latex   Social History   Socioeconomic History  . Marital status: Widowed    Spouse name: Not on file  . Number of children: 2  . Years of education: Not on file  . Highest education level: Master's degree (e.g., MA, MS, [name], MEd, [name], [name])  Occupational History  . Occupation: Product manager: RETIRED    Comment: retired  Tobacco Use  . Smoking status: Never Smoker  . Smokeless tobacco: Never Used  Vaping Use  . Vaping Use: Never used  Substance and Sexual Activity  . Alcohol use: Yes    Comment: rare  . Drug use: No  . Sexual activity: Never  Other Topics Concern  . Not  on file  Social History Narrative   Patient is widowed.  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    One son one daughter   1 caffeinated beverage daily   She is right-handed.    She works out everyday to a televised [employer] 30 minute program.   No tobacco or alcohol         Per [place] New Patient Packet Abstracted on 01/03/2020      Diet: Left blank       Caffeine: Yes      Married, if yes what year: Widowed, married in 1955  Do you live in a house, apartment, assisted living, condo, trailer, ect: Apartment      Is it one or more stories: One stories, one person       Pets: 1 cat      Current/Past profession: Teacher      Highest level or education completed: MA of Education       Exercise:   Yes             Type and how often: Cardio, chair aerobics, 3-4 times weekly          Living Will: Yes   DNR: Yes   POA/HPOA: Yes      Functional Status:   Do you have difficulty bathing or dressing yourself? Left blank   Do you have difficulty preparing food or eating? Left blank   Do you have difficulty managing your medications? Left blank   Do you have difficulty managing your finances? Left blank   Do you have difficulty affording your medications? Left blank   Social Determinants of Health   Financial Resource Strain: Not on file  Food Insecurity: Not on file  Transportation Needs: Not on file  Physical Activity: Not on file  Stress: Not on file  Social Connections: Not on file     Family History: The patient's family history includes Cancer in her mother; Coronary artery disease in her brother; Diabetes in her brother and maternal aunt; Heart disease in her brother; Heart disease (age of onset: 18) in her father; High Cholesterol in her son; Hyperlipidemia in her brother; Hypertension in her brother and son; Lung cancer in an other family member; Lung cancer (age of onset: 54) in her sister; Thrombosis in an other family member.  ROS:   Please see the  history of present illness.     All other systems reviewed and are negative.  EKGs/Labs/Other Studies Reviewed:    The following studies were reviewed today:  Echo 08/12/2015 Study Conclusions   - Left ventricle: The cavity size was normal. Wall thickness was  increased in a pattern of mild LVH. Systolic function was normal.  The estimated ejection fraction was in the range of 60% to 65%.  Wall motion was normal; there were no regional wall motion  abnormalities. Doppler parameters are consistent with abnormal  left ventricular relaxation (grade 1 diastolic dysfunction).  - Left atrium: The atrium was mildly dilated.  - Pulmonary arteries: Systolic pressure was mildly increased. PA  peak pressure: 32 mm Hg (S).   EKG:  EKG is ordered today.  The ekg ordered today demonstrates sinus bradycardia, poor R wave progression in the anterior leads.  Recent Labs: 07/16/2020: ALT 19; BUN 15; Creatinine 0.7; Hemoglobin 15.5; Potassium 4.4; Sodium 142  Recent Lipid Panel    Component Value Date/Time   CHOL 152 07/16/2020 0000   TRIG 69 07/16/2020 0000   HDL 74 (A) 07/16/2020 0000   CHOLHDL 3 11/19/2019 1403   VLDL 17.8 11/19/2019 1403   LDLCALC 65 07/16/2020 0000   LDLDIRECT 153.4 11/16/2009 0000     Risk Assessment/Calculations:    CHA2DS2-VASc Score = 4  This indicates a 4.8% annual risk of stroke. The patient's score is based upon: CHF History: No HTN History: Yes Diabetes History: No Stroke History: No Vascular Disease History: No Age Score: 2 Gender Score: 1      Physical Exam:    VS:  BP 130/70 (BP Location: Right Arm, Patient Position: Sitting, Cuff Size: Normal)   Pulse 60  Resp 18   Ht 5' (1.524 m)   Wt 160 lb 3.2 oz (72.7 kg)   SpO2 99%   BMI 31.29 kg/m     Wt Readings from Last 3 Encounters:  11/24/20 160 lb 3.2 oz (72.7 kg)  11/05/20 160 lb (72.6 kg)  10/21/20 161 lb 6.4 oz (73.2 kg)     GEN:  Well nourished, well developed in no acute  distress HEENT: Normal NECK: No JVD; No carotid bruits LYMPHATICS: No lymphadenopathy CARDIAC: RRR, no murmurs, rubs, gallops RESPIRATORY:  Clear to auscultation without rales, wheezing or rhonchi  ABDOMEN: Soft, non-tender, non-distended MUSCULOSKELETAL:  No edema; No deformity  SKIN: Warm and dry NEUROLOGIC:  Alert and oriented x 3 PSYCHIATRIC:  Normal affect   ASSESSMENT:    1. Paroxysmal atrial fibrillation (HCC)   2. Primary hypertension   3. Hyperlipidemia LDL goal <100   4. Slurred speech    PLAN:    In order of problems listed above:  1. Paroxysmal atrial fibrillation: Continue Eliquis, flecainide and diltiazem.  Maintaining sinus bradycardia based on today's EKG.  Continue on the current therapy  2. Hypertension: Blood pressure well controlled on current therapy  3. Hyperlipidemia: On pravastatin  4. Episodic slurred speech: Symptom has been going on for the past several months.  I urged her to see neurology for this.  She has been compliant with Eliquis.  Neuro exam today did not reveal any focal deficit.        Medication Adjustments/Labs and Tests Ordered: Current medicines are reviewed at length with the patient today.  Concerns regarding medicines are outlined above.  Orders Placed This Encounter  Procedures  . EKG 12-Lead   No orders of the defined types were placed in this encounter.   Patient Instructions  Medication Instructions:  Your physician recommends that you continue on your current medications as directed. Please refer to the Current Medication list given to you today.  *If you need a refill on your cardiac medications before your next appointment, please call your pharmacy*  Lab Work: NONE ordered at this time of appointment   If you have labs (blood work) drawn today and your tests are completely normal, you will receive your results only by: [name] MyChart Message (if you have MyChart) OR . A paper copy in the mail If you have any lab  test that is abnormal or we need to change your treatment, we will call you to review the results.  Testing/Procedures: NONE ordered at this time of appointment   Follow-Up: At [facility], you and your health needs are our priority.  As part of our continuing mission to provide you with exceptional heart care, we have created designated Provider Care Teams.  These Care Teams include your primary Cardiologist (physician) and Advanced Practice Providers (APPs -  Physician Assistants and Nurse Practitioners) who all work together to provide you with the care you need, when you need it.  Your next appointment:   6 month(s)  The format for your next appointment:   In Person  Provider:   Sanda Klein, MD  Other Instructions      Signed, Almyra Deforest, [place]  11/25/2020 11:07 PM    [place]

## 2020-11-24 NOTE — Patient Instructions (Signed)

## 2020-11-25 ENCOUNTER — Encounter: Payer: Self-pay | Admitting: Physician Assistant

## 2020-11-26 NOTE — Progress Notes (Signed)
NEUROLOGY FOLLOW UP OFFICE NOTE  Jessica Mullins [id number]  Assessment/Plan:   1.  Visual disturbance - semiology sounds very much like migraine aura/ocular migraine.  However she denies prior history of migraines or headaches in general.  Less common, consider atypical occipital seizures. 2.  Brainstem meningioma - unlikely to be the cause unless it has grown to involve compression of the occipital lobes.  1.  Will check MRI of brain with and without contrast. 2.  Further recommendations pending results 3.  Follow up after testing  Subjective:  Jessica Mullins is an 87 year old female who follows up at the request of ophthalmology for vision changes.  History supplemented by ophthalmology note.  Two weeks ago, she started experiencing visual disturbance.  If she looks at something, such as a flower, the petals will suddenly change position and jump around with change in shade of the colors.  It can last a couple of minutes up to an hour.  It would occur daily to every other day for about a week.  No associated vision loss, headache, phantosmia, epigastric rising/nausea, numbness or focal weakness.  It has not occurred while reading, watching TV or while driving.  She saw ophthalmology who did not note any significant findings on exam. She has not had an episode for about a week but had mild symptoms while looking at a painting while sitting in the waiting room.  Denies history of headaches/migraines or seizures.  MRI of brain with and without contrast on 10/05/2019 performed due to head injury showed 2.6 x 2 x 2.7 cm meningioma compressing the right anteriolateral medulla and craniocervical junction without parenchymal edema. She saw neurosurgery who did not advise surgical intervention.  PAST MEDICAL HISTORY: Past Medical History:  Diagnosis Date  . Allergy   . Ankle fracture, left   . Atrial fibrillation [facility])    Per [place] Patient Packet   . Breast cancer ([place]) 1115/16   left    . Bursitis of right shoulder   . Cataract   . Family history of cancer   . Fracture of right wrist   . GERD (gastroesophageal reflux disease)   . Hyperlipidemia   . Hypertension   . Lumbar radiculitis    Per [facility] New Patient Packet   . Meningioma [facility])    Per [facility] New Patient Packet   . Skin cancer 2000   melanoma and basal cell  . Stress fracture    Right Heel, Per [place] New Patient Packet   . Vaso vagal episode    during preparation for colonoscopy    MEDICATIONS: Current Outpatient Medications on File Prior to Visit  Medication Sig Dispense Refill  . AMBULATORY NON FORMULARY MEDICATION Walker use daily as directed.  Disp 1 Leg pain M79.604 1 each 0  . Biotin 5 MG CAPS Take 5 mg by mouth daily.     . Calcium Carbonate-Vitamin D 600-400 MG-UNIT tablet Take 1 tablet by mouth daily.    . Cholecalciferol (VITAMIN D3) 1000 UNITS CAPS Take 1 capsule by mouth daily.    . cycloSPORINE (RESTASIS) 0.05 % ophthalmic emulsion PLACE 1 DROP INTO BOTH EYES TWO (TWO) TIMES DAILY.    [name] diltiazem (CARDIZEM) 30 MG tablet Take 1 tablet (30 mg total) by mouth 2 (two) times daily. 60 tablet 4  . ELIQUIS 5 MG TABS tablet TAKE 1 TABLET BY MOUTH TWICE DAILY. 60 tablet 2  . flecainide (TAMBOCOR) 50 MG tablet Take 1 tablet (50 mg total) by  mouth 2 (two) times daily. 60 tablet 10  . ketoconazole (NIZORAL) 2 % cream APPLY 1 APPLICATION TOPICALLY DAILY AS NEEDED FOR IRRITATION 15 g 1  . methocarbamol (ROBAXIN) 500 MG tablet Take 1 tablet (500 mg total) by mouth 3 (three) times daily as needed (muscle spasm/pain). 15 tablet 0  . Multiple Vitamins-Minerals (PRESERVISION AREDS) TABS Take 2 tablets by mouth daily.    . Omega-3 Fatty Acids (FISH OIL PO) Take 1 tablet by mouth daily.     . polyethylene glycol (MIRALAX / GLYCOLAX) 17 g packet Take 17 g by mouth daily.    . pravastatin (PRAVACHOL) 40 MG tablet TAKE 1 TABLET ONCE DAILY. 90 tablet 1  . Psyllium (METAMUCIL PO) Take 15 mLs by mouth daily.     .  Turmeric 500 MG CAPS Take 1 capsule by mouth daily.     . vitamin B-12 (CYANOCOBALAMIN) 1000 MCG tablet Take 1 tablet (1,000 mcg total) by mouth daily.     No current facility-administered medications on file prior to visit.    ALLERGIES: Allergies  Allergen Reactions  . Sulfa Antibiotics Other (See Comments)    As a child, rigid as a stick and not responsive  . Tape Other (See Comments)    Blisters, Please use "paper" tape only for short periods of time  . Glucosamine Forte [Nutritional Supplements] Rash  . Latex Rash    FAMILY HISTORY: Family History  Problem Relation Age of Onset  . Cancer Mother        mets to bone--? primary  . Coronary artery disease Brother        died of M! @ 17  . Diabetes Brother   . Hyperlipidemia Brother   . Hypertension Brother   . Heart disease Brother        chf  . Heart disease Father 39       MI  . Lung cancer Sister 53       former smoker  . Diabetes Maternal Aunt   . Lung cancer Other   . Thrombosis Other        thromboembolism clotting disorder--? father died of ? clot   . Hypertension Son   . High Cholesterol Son       Objective:  Blood pressure 128/66, pulse 68, height 5' 0.5" (1.537 m), weight 159 lb (72.1 kg), SpO2 96 %. General: No acute distress.  Patient appears well-groomed.   Head:  Normocephalic/atraumatic Eyes:  Fundi examined but not visualized Neck: supple, no paraspinal tenderness, full range of motion Heart:  Regular rate and rhythm Lungs:  Clear to auscultation bilaterally Back: No paraspinal tenderness Neurological Exam: alert and oriented to person, place, and time.  Speech fluent and not dysarthric, language intact.  CN II-XII intact. Bulk and tone normal, muscle strength 5/5 throughout.  Sensation to light touch intact.  Deep tendon reflexes absent throughout, toes downgoing.  Finger to nose testing intact.  Unsteady gait.   Metta Clines, DO  CC: Jessica Miners, MD

## 2020-11-27 ENCOUNTER — Other Ambulatory Visit: Payer: Self-pay

## 2020-11-27 ENCOUNTER — Encounter: Payer: Self-pay | Admitting: Neurology

## 2020-11-27 ENCOUNTER — Ambulatory Visit: Payer: Medicare PPO | Admitting: Neurology

## 2020-11-27 VITALS — BP 128/66 | HR 68 | Ht 60.5 in | Wt 159.0 lb

## 2020-11-27 DIAGNOSIS — H539 Unspecified visual disturbance: Secondary | ICD-10-CM | POA: Diagnosis not present

## 2020-11-27 DIAGNOSIS — Z86011 Personal history of benign neoplasm of the brain: Secondary | ICD-10-CM | POA: Diagnosis not present

## 2020-11-27 NOTE — Addendum Note (Signed)
Addended by: Venetia Night on: 11/27/2020 10:02 AM   Modules accepted: Orders

## 2020-11-27 NOTE — Patient Instructions (Addendum)
MRI of brain with and without contrast. We have sent a referral to [facility] for your MRI and they will call you directly to schedule your appointment. They are located at [address] If you need to contact them directly please call [phone].  Further recommendations pending results Follow up after testing 

## 2020-12-03 ENCOUNTER — Other Ambulatory Visit: Payer: Self-pay | Admitting: Cardiovascular Disease

## 2020-12-09 ENCOUNTER — Encounter: Payer: Medicare PPO | Admitting: Neurology

## 2020-12-09 ENCOUNTER — Other Ambulatory Visit: Payer: Medicare PPO

## 2020-12-10 ENCOUNTER — Ambulatory Visit
Admission: RE | Admit: 2020-12-10 | Discharge: 2020-12-10 | Disposition: A | Discharge status: Home / Self Care | Payer: Medicare PPO | Source: Ambulatory Visit | Attending: Neurology | Admitting: Neurology

## 2020-12-10 ENCOUNTER — Other Ambulatory Visit: Payer: Self-pay

## 2020-12-10 DIAGNOSIS — H539 Unspecified visual disturbance: Secondary | ICD-10-CM

## 2020-12-10 DIAGNOSIS — Z86011 Personal history of benign neoplasm of the brain: Secondary | ICD-10-CM

## 2020-12-10 MED ORDER — GADOBENATE DIMEGLUMINE 529 MG/ML IV SOLN
14.0000 mL | Freq: Once | INTRAVENOUS | Status: AC | PRN
  Administered 2020-12-10: 14 mL via INTRAVENOUS

## 2020-12-11 ENCOUNTER — Telehealth: Payer: Self-pay | Admitting: Neurology

## 2020-12-11 ENCOUNTER — Ambulatory Visit: Payer: Medicare PPO | Admitting: Neurology

## 2020-12-11 NOTE — Telephone Encounter (Signed)
MRI is stable.  No change to the meningioma and nothing to explain hersymptoms.  No further recommendations at this time.    Pt advised.

## 2020-12-11 NOTE — Telephone Encounter (Signed)
Patient called back, would like a call back today. She said she thinks its regarding her results, but she couldn't hear vm

## 2020-12-11 NOTE — Progress Notes (Signed)
LMOVM to call the office back.

## 2020-12-14 ENCOUNTER — Non-Acute Institutional Stay: Payer: Medicare PPO | Admitting: Adult Health

## 2020-12-14 ENCOUNTER — Encounter: Payer: Self-pay | Admitting: Adult Health

## 2020-12-14 ENCOUNTER — Other Ambulatory Visit: Payer: Self-pay

## 2020-12-14 VITALS — BP 134/80 | HR 70 | Temp 96.2°F | Ht 60.5 in | Wt 160.0 lb

## 2020-12-14 DIAGNOSIS — K5901 Slow transit constipation: Secondary | ICD-10-CM | POA: Diagnosis not present

## 2020-12-14 DIAGNOSIS — M5416 Radiculopathy, lumbar region: Secondary | ICD-10-CM | POA: Diagnosis not present

## 2020-12-14 MED ORDER — METHOCARBAMOL 500 MG PO TABS: 500.0000 mg | ORAL_TABLET | Freq: Three times a day (TID) | ORAL | 0 refills | Status: DC | PRN

## 2020-12-14 NOTE — Patient Instructions (Addendum)
Robaxin 500 mg three times a day as needed for pain. If you are noticing the pain is present at night regularly may take one each evening on a schedule. Monitor for sedation and lethargy.  Take tylenol 1 gram tid as needed for pain max is 3 grams in 24 hrs  If no improvement in the next two weeks call our office back and we can place a referral to neurosurgery and/or call in prednisone taper

## 2020-12-14 NOTE — Progress Notes (Signed)
[facility]  Provider:  Cindi Carbon, [place] [phone]   Goals of Care:  Advanced Directives 11/05/2020  Does Patient Have a Medical Advance Directive? Yes  Type of Advance Directive -  Does patient want to make changes to medical advance directive? -  Copy of [place] in Chart? -  Would patient like information on creating a medical advance directive? -  Pre-existing out of facility DNR order (yellow form or pink MOST form) -     Chief Complaint  Patient presents with   Acute Visit    Patient returns to the clinic with back pain.     HPI: Patient is a 87 y.o. female seen today for an acute visit for lumbar back pain.  Started bothering her 2-3 days ago but has had lumbar stenosis chronically. MRI of the lumbar spine in April for 2022 showed foraminal stenosis. Denies that the pain goes down in her leg at this time. No change in bowel and bladder habits but has constipation. No numbness or tingling.  She reports she has had back injections in the past. Reports PT has not worked in the past but made things worse. She fell one week ago and sustained a knee abrasion but no acute injury.  LBM this morning takes metamucil and miralax  Past Medical History:  Diagnosis Date   Allergy    Ankle fracture, left    Atrial fibrillation ([place])    Per [place] New Patient Packet    Breast cancer ([place]) 1115/16   left    Bursitis of right shoulder    Cataract    Family history of cancer    Fracture of right wrist    GERD (gastroesophageal reflux disease)    Hyperlipidemia    Hypertension    Lumbar radiculitis    Per [place] New Patient Packet    Meningioma [facility])    Per [place] New Patient Packet    Skin cancer 2000   melanoma and basal cell   Stress fracture    Right Heel, Per [place] New Patient Packet    Vaso vagal episode    during preparation for colonoscopy    Past Surgical History:  Procedure Laterality Date    ABDOMINAL HYSTERECTOMY  1988   TAH/BSO--FIBROIDS   APPENDECTOMY     BREAST LUMPECTOMY     B/L--[facility]   BREAST LUMPECTOMY WITH RADIOACTIVE SEED AND SENTINEL LYMPH NODE BIOPSY Left 06/11/2015   Procedure: LEFT BREAST LUMPECTOMY WITH RADIOACTIVE SEED AND LEFT SENTINEL LYMPH NODE MAPPING;  Surgeon: Erroll Luna, MD;  Location: [facility];  Service: General;  Laterality: Left;   CATARACT EXTRACTION  2015   COLONOSCOPY  2010   DG  BONE DENSITY ([place] HX)     EYE SURGERY Bilateral    cataracts   fiberadenoma Bilateral 1978, 1980   GANGLION CYST EXCISION     L  hand   Lipiflow procedure      Allergies  Allergen Reactions   Sulfa Antibiotics Other (See Comments)    As a child, rigid as a stick and not responsive   Tape Other (See Comments)    Blisters, Please use "paper" tape only for short periods of time   Glucosamine Forte [Nutritional Supplements] Rash   Latex Rash    Outpatient Encounter Medications as of 12/14/2020  Medication Sig   AMBULATORY NON FORMULARY MEDICATION Walker use daily as directed.  Disp 1 Leg pain M79.604   Biotin 5  MG CAPS Take 5 mg by mouth daily.    Calcium Carbonate-Vitamin D 600-400 MG-UNIT tablet Take 1 tablet by mouth daily.   Cholecalciferol (VITAMIN D3) 1000 UNITS CAPS Take 1 capsule by mouth daily.   cycloSPORINE (RESTASIS) 0.05 % ophthalmic emulsion PLACE 1 DROP INTO BOTH EYES TWO (TWO) TIMES DAILY.   diltiazem (CARDIZEM) 30 MG tablet TAKE 1 TABLET BY MOUTH TWICE DAILY.   ELIQUIS 5 MG TABS tablet TAKE 1 TABLET BY MOUTH TWICE DAILY.   flecainide (TAMBOCOR) 50 MG tablet Take 1 tablet (50 mg total) by mouth 2 (two) times daily.   ketoconazole (NIZORAL) 2 % cream APPLY 1 APPLICATION TOPICALLY DAILY AS NEEDED FOR IRRITATION   Multiple Vitamins-Minerals (PRESERVISION AREDS) TABS Take 2 tablets by mouth daily.   Omega-3 Fatty Acids (FISH OIL PO) Take 1 tablet by mouth daily.    polyethylene glycol (MIRALAX / GLYCOLAX) 17 g packet Take 17 g by mouth daily.    pravastatin (PRAVACHOL) 40 MG tablet TAKE 1 TABLET ONCE DAILY.   Psyllium (METAMUCIL PO) Take 15 mLs by mouth daily.    Turmeric 500 MG CAPS Take 1 capsule by mouth daily.    vitamin B-12 (CYANOCOBALAMIN) 1000 MCG tablet Take 1 tablet (1,000 mcg total) by mouth daily.   [DISCONTINUED] methocarbamol (ROBAXIN) 500 MG tablet Take 1 tablet (500 mg total) by mouth 3 (three) times daily as needed (muscle spasm/pain).   methocarbamol (ROBAXIN) 500 MG tablet Take 1 tablet (500 mg total) by mouth 3 (three) times daily as needed (muscle spasm/pain).   No facility-administered encounter medications on file as of 12/14/2020.    Review of Systems:  Review of Systems  Constitutional:  Negative for activity change, appetite change, chills, diaphoresis, fatigue, fever and unexpected weight change.  HENT:  Negative for congestion.   Respiratory:  Negative for cough, shortness of breath and wheezing.   Cardiovascular:  Negative for chest pain, palpitations and leg swelling.  Gastrointestinal:  Negative for abdominal distention, abdominal pain, constipation and diarrhea.  Genitourinary:  Negative for difficulty urinating and dysuria.  Musculoskeletal:  Negative for arthralgias, back pain, gait problem, joint swelling and myalgias.  Neurological:  Negative for dizziness, tremors, seizures, syncope, facial asymmetry, speech difficulty, weakness, light-headedness, numbness and headaches.  Psychiatric/Behavioral:  Negative for agitation, behavioral problems and confusion.    Health Maintenance  Topic Date Due   COVID-19 Vaccine (4 - Booster for Pfizer series) 06/29/2020   INFLUENZA VACCINE  01/25/2021   MAMMOGRAM  05/26/2021   TETANUS/TDAP  10/24/2030   DEXA SCAN  Completed   PNA vac Low Risk Adult  Completed   Zoster Vaccines- Shingrix  Completed   HPV VACCINES  Aged Out    Physical Exam: Vitals:   12/14/20 1307  BP: 134/80  Pulse: 70  Temp: (!) 96.2 F (35.7 C)  SpO2: 99%  Weight: 160 lb (72.6  kg)  Height: 5' 0.5" (1.537 m)   Body mass index is 30.73 kg/m. Physical Exam Vitals and nursing note reviewed.  Constitutional:      General: She is not in acute distress.    Appearance: She is not diaphoretic.  HENT:     Head: Normocephalic and atraumatic.  Neck:     Vascular: No JVD.  Cardiovascular:     Rate and Rhythm: Normal rate. Rhythm irregular.     Heart sounds: No murmur heard. Pulmonary:     Effort: Pulmonary effort is normal. No respiratory distress.     Breath sounds: Normal breath sounds. No  wheezing.  Abdominal:     General: Bowel sounds are normal. There is no distension.     Palpations: Abdomen is soft.     Tenderness: There is no abdominal tenderness.  Musculoskeletal:     Lumbar back: No swelling, edema, signs of trauma, lacerations, spasms, tenderness or bony tenderness. Decreased range of motion.     Right lower leg: No edema.     Left lower leg: No edema.     Comments: Not able to lie back for full exam with SLR.     Skin:    General: Skin is warm and dry.  Neurological:     Mental Status: She is alert and oriented to person, place, and time.     Deep Tendon Reflexes:     Reflex Scores:      Patellar reflexes are 1+ on the right side and 1+ on the left side.      Achilles reflexes are 1+ on the right side and 1+ on the left side.    Comments: Strength 5/5 to BLE 4/5 to BLE  Psychiatric:        Mood and Affect: Mood normal.    Labs reviewed: Basic Metabolic Panel: Recent Labs    07/16/20 0000  NA 142  K 4.4  CL 103  CO2 31*  BUN 15  CREATININE 0.7  CALCIUM 10.4   Liver Function Tests: Recent Labs    07/16/20 0000  AST 19  ALT 19  ALBUMIN 4.4   No results for input(s): LIPASE, AMYLASE in the last 8760 hours. No results for input(s): AMMONIA in the last 8760 hours. CBC: Recent Labs    07/16/20 0000  WBC 6.1  HGB 15.5   Lipid Panel: Recent Labs    07/16/20 0000  CHOL 152  HDL 74*  LDLCALC 65  TRIG 69   No results  found for: HGBA1C  Procedures since last visit: MR BRAIN W WO CONTRAST  Result Date: 12/10/2020 CLINICAL DATA:  Brain mass or lesion.  History of meningioma. EXAM: MRI HEAD WITHOUT AND WITH CONTRAST TECHNIQUE: Multiplanar, multiecho pulse sequences of the brain and surrounding structures were obtained without and with intravenous contrast. CONTRAST:  5mL MULTIHANCE GADOBENATE DIMEGLUMINE 529 MG/ML IV SOLN COMPARISON:  10/05/2019 FINDINGS: Brain: 33 x 28 x 20 mm enhancing mass in the low right posterior fossa compressing the lower brainstem and displacing the vertebral arteries. The mass is directly over riding the right hypoglossal canal with regional dural thickening along the posterior clivus but is homogeneously enhancing and the hypoglossal canal does not appear enlarged, again most likely meningioma. The lower fourth ventricle is collapsed but no dilatation of the upper fourth ventricle or to notable degree affecting the third ventricle. Lateral ventricular enlargement is likely from volume loss. Chronic small vessel ischemia in the cerebral white matter which is mild for age. Vascular: Normal flow voids and vascular enhancements, including the vertebral arteries Skull and upper cervical spine: Normal marrow signal Sinuses/Orbits: Bilateral cataract resection. IMPRESSION: Essentially size stable posterior fossa mass that remains most consistent with meningioma, up to 3.3 cm. There is unchanged marked brainstem compression but no edema or new ventriculomegaly. Electronically Signed   By: Monte Fantasia M.D.   On: 12/10/2020 17:26    Assessment/Plan  1. Right lumbar radiculitis Declined prednisone taper at this time but will call back if not improving May use tylenol 1 gram tid prn Add - methocarbamol (ROBAXIN) 500 MG tablet; Take 1 tablet (500 mg total) by mouth  3 (three) times daily as needed (muscle spasm/pain).  Dispense: 20 tablet; Refill: 0 Declined PT as it did not help in the  past  Monitor for lethargy If no improvement will need neurosurgery referral  2. Slow transit constipation Controlled with metamucil and miralax  Total time 25 min  time greater than 50% of total time spent doing pt counseling and coordination of care  Labs/tests ordered:  * No order type specified * Next appt:  01/22/2021

## 2021-01-05 ENCOUNTER — Ambulatory Visit: Payer: Medicare PPO | Attending: Internal Medicine

## 2021-01-05 ENCOUNTER — Other Ambulatory Visit: Payer: Self-pay

## 2021-01-05 ENCOUNTER — Other Ambulatory Visit (HOSPITAL_BASED_OUTPATIENT_CLINIC_OR_DEPARTMENT_OTHER): Payer: Self-pay

## 2021-01-05 DIAGNOSIS — Z23 Encounter for immunization: Secondary | ICD-10-CM

## 2021-01-05 MED ORDER — PFIZER-BIONT COVID-19 VAC-TRIS 30 MCG/0.3ML IM SUSP
INTRAMUSCULAR | 0 refills | Status: DC
  Filled 2021-01-05: qty 0.3, 1d supply, fill #0

## 2021-01-05 NOTE — Progress Notes (Signed)
   Covid-19 Vaccination Clinic  Name:  Jessica Mullins    MRN: [id number] DOB: 05-Mar-1931  01/05/2021  Jessica Mullins was observed post Covid-19 immunization for 15 minutes without incident. She was provided with Vaccine Information Sheet and instruction to access the V-Safe system.   Jessica Mullins was instructed to call 911 with any severe reactions post vaccine: Difficulty breathing  Swelling of face and throat  A fast heartbeat  A bad rash all over body  Dizziness and weakness   Immunizations Administered     Name Date Dose VIS Date Route   PFIZER Comrnaty(Gray TOP) Covid-19 Vaccine 01/05/2021  2:11 PM 0.3 mL 06/04/2020 Intramuscular   Manufacturer: [employer]   Lot: [id number]   [place]: [phone]

## 2021-01-10 ENCOUNTER — Other Ambulatory Visit: Payer: Self-pay | Admitting: Hematology and Oncology

## 2021-01-10 DIAGNOSIS — I4819 Other persistent atrial fibrillation: Secondary | ICD-10-CM

## 2021-01-22 ENCOUNTER — Other Ambulatory Visit: Payer: Self-pay

## 2021-01-22 ENCOUNTER — Ambulatory Visit: Payer: Medicare PPO | Admitting: *Deleted

## 2021-01-22 DIAGNOSIS — M8580 Other specified disorders of bone density and structure, unspecified site: Secondary | ICD-10-CM | POA: Diagnosis not present

## 2021-01-22 MED ORDER — DENOSUMAB 60 MG/ML ~~LOC~~ SOSY
60.0000 mg | PREFILLED_SYRINGE | Freq: Once | SUBCUTANEOUS | Status: AC
Start: 2021-01-22 — End: 2021-01-22
  Administered 2021-01-22: 60 mg via SUBCUTANEOUS

## 2021-02-11 ENCOUNTER — Encounter: Payer: Self-pay | Admitting: Internal Medicine

## 2021-02-11 LAB — BASIC METABOLIC PANEL
BUN: 17 (ref 4–21)
CO2: 26 — AB (ref 13–22)
Chloride: 105 (ref 99–108)
Creatinine: 0.7 (ref 0.5–1.1)
Glucose: 104
Potassium: 4.4 (ref 3.4–5.3)
Sodium: 143 (ref 137–147)

## 2021-02-11 LAB — CBC: RBC: 4.95 (ref 3.87–5.11)

## 2021-02-11 LAB — CBC AND DIFFERENTIAL
HCT: 45 (ref 36–46)
Hemoglobin: 15.5 (ref 12.0–16.0)
Platelets: 210 (ref 150–399)
WBC: 5.4

## 2021-02-11 LAB — HEPATIC FUNCTION PANEL
ALT: 17 (ref 7–35)
AST: 19 (ref 13–35)
Alkaline Phosphatase: 60 (ref 25–125)
Bilirubin, Total: 0.5

## 2021-02-11 LAB — COMPREHENSIVE METABOLIC PANEL
Albumin: 4.4 (ref 3.5–5.0)
Calcium: 9.8 (ref 8.7–10.7)
Globulin: 1.8

## 2021-02-11 LAB — LIPID PANEL
Cholesterol: 156 (ref 0–200)
HDL: 66 (ref 35–70)
LDL Cholesterol: 75
LDl/HDL Ratio: 2.4
Triglycerides: 73 (ref 40–160)

## 2021-02-11 LAB — TSH: TSH: 2.99 (ref 0.41–5.90)

## 2021-02-17 ENCOUNTER — Other Ambulatory Visit: Payer: Self-pay

## 2021-02-17 ENCOUNTER — Non-Acute Institutional Stay: Payer: Medicare PPO | Admitting: Internal Medicine

## 2021-02-17 ENCOUNTER — Encounter: Payer: Self-pay | Admitting: Internal Medicine

## 2021-02-17 VITALS — BP 134/72 | HR 79 | Temp 96.8°F | Resp 16 | Ht 66.0 in | Wt 158.4 lb

## 2021-02-17 DIAGNOSIS — R2689 Other abnormalities of gait and mobility: Secondary | ICD-10-CM

## 2021-02-17 DIAGNOSIS — E7800 Pure hypercholesterolemia, unspecified: Secondary | ICD-10-CM

## 2021-02-17 DIAGNOSIS — D329 Benign neoplasm of meninges, unspecified: Secondary | ICD-10-CM

## 2021-02-17 DIAGNOSIS — M1711 Unilateral primary osteoarthritis, right knee: Secondary | ICD-10-CM

## 2021-02-17 DIAGNOSIS — M5416 Radiculopathy, lumbar region: Secondary | ICD-10-CM | POA: Diagnosis not present

## 2021-02-17 DIAGNOSIS — I10 Essential (primary) hypertension: Secondary | ICD-10-CM

## 2021-02-17 DIAGNOSIS — M8580 Other specified disorders of bone density and structure, unspecified site: Secondary | ICD-10-CM | POA: Diagnosis not present

## 2021-02-17 DIAGNOSIS — I480 Paroxysmal atrial fibrillation: Secondary | ICD-10-CM | POA: Diagnosis not present

## 2021-02-17 NOTE — Patient Instructions (Signed)
Referal to [facility] for Bine density Cut back your coffee in the evening and see if it  helps Try to empty bladder before going to bed Let me know if you want to try Therapy for unstable gait

## 2021-02-18 NOTE — Progress Notes (Signed)
Location:  [place] of Service:  Clinic (12)  Provider:   Code Status:  Goals of Care:  Advanced Directives 02/17/2021  Does Patient Have a Medical Advance Directive? Yes  Type of Paramedic of [place];Out of facility DNR (pink MOST or yellow form)  Does patient want to make changes to medical advance directive? No - Patient declined  Copy of [place] in Chart? Yes - validated most recent copy scanned in chart (See row information)  Would patient like information on creating a medical advance directive? -  Pre-existing out of facility DNR order (yellow form or pink MOST form) Yellow form placed in chart (order not valid for inpatient use)     Chief Complaint  Patient presents with   Medical Management of Chronic Issues    4 month follow. Patient states she has increased urinary output during the night    HPI: Patient is a 87 y.o. female seen today for medical management of chronic diseases.    Patient has h/o Meningoma Diagnosed with MRI in 4/21 Has seen Neurosurgery H/o Low Back Pain with Radiculopathy follows with Neurology H/o Breast Cancer Invasive Ductal Carcinoma diagnosed in 2016 Osteopenia Last DEXA was -1.6 in 2020 H/o PAF  Follows with Cardiology On Flecainide and Eliquis Also h/o Bradycardia Hypertension with Tendency for Orthostatic Hypotension  Also h/o issue with her balance Was seen by both Cardiology and Neurology and do not have definite  answer for her Recurrent falls.  Since her last visit patient has not had any falls.  She denies any dizziness.  Usually it happens when she is not using her walker and her legs feel weak and she goes down.  She has done therapy before.  Is now doing exercises but thinks that it makes her balance issue worse.  She did have an MRI done by Dr. Tomi Likens Lumbar spine MRI showed degeneration and spinal stenosis MRI of the  brain showed meningioma with  brainstem compression but no edema  Cognitively continues to do well.  Still drives.  Walks with her walker.  Independent in her ADLs Past Medical History:  Diagnosis Date   Allergy    Ankle fracture, left    Atrial fibrillation ([place])    Per [place] New Patient Packet    Breast cancer ([place]) 1115/16   left    Bursitis of right shoulder    Cataract    Family history of cancer    Fracture of right wrist    GERD (gastroesophageal reflux disease)    Hyperlipidemia    Hypertension    Lumbar radiculitis    Per [place] New Patient Packet    Meningioma [facility])    Per [place] New Patient Packet    Skin cancer 2000   melanoma and basal cell   Stress fracture    Right Heel, Per [place] New Patient Packet    Vaso vagal episode    during preparation for colonoscopy    Past Surgical History:  Procedure Laterality Date   ABDOMINAL HYSTERECTOMY  1988   TAH/BSO--FIBROIDS   APPENDECTOMY     BREAST LUMPECTOMY     B/L--[facility]   BREAST LUMPECTOMY WITH RADIOACTIVE SEED AND SENTINEL LYMPH NODE BIOPSY Left 06/11/2015   Procedure: LEFT BREAST LUMPECTOMY WITH RADIOACTIVE SEED AND LEFT SENTINEL LYMPH NODE MAPPING;  Surgeon: Erroll Luna, MD;  Location: [facility];  Service: General;  Laterality: Left;   CATARACT EXTRACTION  2015   COLONOSCOPY  2010  DG  BONE DENSITY ([facility] HX)     EYE SURGERY Bilateral    cataracts   fiberadenoma Bilateral 1978, 1980   GANGLION CYST EXCISION     L  hand   Lipiflow procedure      Allergies  Allergen Reactions   Sulfa Antibiotics Other (See Comments)    As a child, rigid as a stick and not responsive   Tape Other (See Comments)    Blisters, Please use "paper" tape only for short periods of time   Glucosamine Forte [Nutritional Supplements] Rash   Latex Rash    Outpatient Encounter Medications as of 02/17/2021  Medication Sig   AMBULATORY NON FORMULARY MEDICATION Walker use daily as directed.  Disp 1 Leg pain M79.604   Biotin 5 MG CAPS Take 5 mg by mouth daily.    Calcium  Carbonate-Vitamin D 600-400 MG-UNIT tablet Take 1 tablet by mouth daily.   Cholecalciferol (VITAMIN D3) 1000 UNITS CAPS Take 1 capsule by mouth daily.   COVID-19 mRNA Vac-TriS, Pfizer, (PFIZER-BIONT COVID-19 VAC-TRIS) SUSP injection Inject into the muscle.   cycloSPORINE (RESTASIS) 0.05 % ophthalmic emulsion PLACE 1 DROP INTO BOTH EYES TWO (TWO) TIMES DAILY.   diltiazem (CARDIZEM) 30 MG tablet TAKE 1 TABLET BY MOUTH TWICE DAILY.   ELIQUIS 5 MG TABS tablet TAKE 1 TABLET BY MOUTH TWICE DAILY.   flecainide (TAMBOCOR) 50 MG tablet Take 1 tablet (50 mg total) by mouth 2 (two) times daily.   ketoconazole (NIZORAL) 2 % cream APPLY 1 APPLICATION TOPICALLY DAILY AS NEEDED FOR IRRITATION   methocarbamol (ROBAXIN) 500 MG tablet Take 1 tablet (500 mg total) by mouth 3 (three) times daily as needed (muscle spasm/pain).   Multiple Vitamins-Minerals (PRESERVISION AREDS) TABS Take 2 tablets by mouth daily.   Omega-3 Fatty Acids (FISH OIL PO) Take 1 tablet by mouth daily.    polyethylene glycol (MIRALAX / GLYCOLAX) 17 g packet Take 17 g by mouth daily.   pravastatin (PRAVACHOL) 40 MG tablet TAKE 1 TABLET ONCE DAILY.   Psyllium (METAMUCIL PO) Take 15 mLs by mouth daily.    Turmeric 500 MG CAPS Take 1 capsule by mouth daily.    vitamin B-12 (CYANOCOBALAMIN) 1000 MCG tablet Take 1 tablet (1,000 mcg total) by mouth daily.   No facility-administered encounter medications on file as of 02/17/2021.    Review of Systems:  Review of Systems Review of Systems  Constitutional: Negative for activity change, appetite change, chills, diaphoresis, fatigue and fever.  HENT: Negative for mouth sores, postnasal drip, rhinorrhea, sinus pain and sore throat.   Respiratory: Negative for apnea, cough, chest tightness, shortness of breath and wheezing.   Cardiovascular: Negative for chest pain, palpitations and leg swelling.  Gastrointestinal: Negative for abdominal distention, abdominal pain, constipation, diarrhea, nausea and  vomiting.  Genitourinary: Negative for dysuria and frequency.  Musculoskeletal: Negative for arthralgias, joint swelling and myalgias.  Skin: Negative for rash.  Neurological: Negative for dizziness, syncope, weakness, light-headedness and numbness. Falls due to Balance issue Psychiatric/Behavioral: Negative for behavioral problems, confusion and sleep disturbance.    Health Maintenance  Topic Date Due   INFLUENZA VACCINE  01/25/2021   COVID-19 Vaccine (5 - Booster for Pfizer series) 05/08/2021   MAMMOGRAM  05/26/2021   TETANUS/TDAP  10/24/2030   DEXA SCAN  Completed   PNA vac Low Risk Adult  Completed   Zoster Vaccines- Shingrix  Completed   HPV VACCINES  Aged Out    Physical Exam: Vitals:   02/17/21 1002  BP: 134/72  Pulse: 79  Resp: 16  Temp: (!) 96.8 F (36 C)  SpO2: 97%  Weight: 158 lb 6.4 oz (71.8 kg)  Height: '5\' 6"'$  (1.676 m)   Body mass index is 25.57 kg/m. Physical Exam Constitutional: Oriented to person, place, and time. Well-developed and well-nourished.  HENT:  Head: Normocephalic.  Mouth/Throat: Oropharynx is clear and moist.  Eyes: Pupils are equal, round, and reactive to light.  Neck: Neck supple.  Cardiovascular: Normal rate and normal heart sounds.  No murmur heard. Pulmonary/Chest: Effort normal and breath sounds normal. No respiratory distress. No wheezes. She has no rales.  Abdominal: Soft. Bowel sounds are normal. No distension. There is no tenderness. There is no rebound.  Musculoskeletal: No edema.  Lymphadenopathy: none Neurological: Alert and oriented to person, place, and time. Walks with holding things. Feels instable otherwise  Skin: Skin is warm and dry.  Psychiatric: Normal mood and affect. Behavior is normal. Thought content normal.   Labs reviewed: Basic Metabolic Panel: Recent Labs    07/16/20 0000  NA 142  K 4.4  CL 103  CO2 31*  BUN 15  CREATININE 0.7  CALCIUM 10.4   Liver Function Tests: Recent Labs     07/16/20 0000  AST 19  ALT 19  ALBUMIN 4.4   No results for input(s): LIPASE, AMYLASE in the last 8760 hours. No results for input(s): AMMONIA in the last 8760 hours. CBC: Recent Labs    07/16/20 0000  WBC 6.1  HGB 15.5   Lipid Panel: Recent Labs    07/16/20 0000  CHOL 152  HDL 74*  LDLCALC 65  TRIG 69   No results found for: HGBA1C  Procedures since last visit: No results found.  Assessment/Plan Senile osteopenia - Plan: DEXA ordered  Right lumbar radiculitis No pain right now Does have Robaxin PRN Meningioma ([facility]) No Change in Recent MRI Dr Tomi Likens doesn't think her balance issues are related to her Meningioma Not Candidate for surgery  Paroxysmal atrial fibrillation ([facility]) On Tambocor and Cardizem and Eliquis  Pure hypercholesterolemia On Pravachol  Essential hypertension Loose control On Cardizem  Balance problem Patient very aware Reinforced to walk with her walker Therapy eval if symptoms get worse All labs reviwed They were in Normal Limits Labs/tests ordered:  Next appt:  06/07/2021

## 2021-03-04 ENCOUNTER — Other Ambulatory Visit: Payer: Self-pay | Admitting: Internal Medicine

## 2021-03-04 DIAGNOSIS — R21 Rash and other nonspecific skin eruption: Secondary | ICD-10-CM

## 2021-04-05 DIAGNOSIS — H353132 Nonexudative age-related macular degeneration, bilateral, intermediate dry stage: Secondary | ICD-10-CM | POA: Diagnosis not present

## 2021-04-05 DIAGNOSIS — H5211 Myopia, right eye: Secondary | ICD-10-CM | POA: Diagnosis not present

## 2021-04-05 DIAGNOSIS — H26491 Other secondary cataract, right eye: Secondary | ICD-10-CM | POA: Diagnosis not present

## 2021-04-05 DIAGNOSIS — Z961 Presence of intraocular lens: Secondary | ICD-10-CM | POA: Diagnosis not present

## 2021-04-06 ENCOUNTER — Encounter (HOSPITAL_BASED_OUTPATIENT_CLINIC_OR_DEPARTMENT_OTHER): Payer: Self-pay | Admitting: Emergency Medicine

## 2021-04-06 ENCOUNTER — Emergency Department (HOSPITAL_BASED_OUTPATIENT_CLINIC_OR_DEPARTMENT_OTHER): Payer: Medicare PPO | Admitting: Radiology

## 2021-04-06 ENCOUNTER — Other Ambulatory Visit: Payer: Self-pay

## 2021-04-06 ENCOUNTER — Emergency Department (HOSPITAL_BASED_OUTPATIENT_CLINIC_OR_DEPARTMENT_OTHER)
Admission: EM | Admit: 2021-04-06 | Discharge: 2021-04-06 | Disposition: A | Discharge status: Home / Self Care | Payer: Medicare PPO | Attending: Emergency Medicine | Admitting: Emergency Medicine

## 2021-04-06 DIAGNOSIS — Z91040 Latex allergy status: Secondary | ICD-10-CM | POA: Diagnosis not present

## 2021-04-06 DIAGNOSIS — I10 Essential (primary) hypertension: Secondary | ICD-10-CM | POA: Insufficient documentation

## 2021-04-06 DIAGNOSIS — Z79899 Other long term (current) drug therapy: Secondary | ICD-10-CM | POA: Diagnosis not present

## 2021-04-06 DIAGNOSIS — I4891 Unspecified atrial fibrillation: Secondary | ICD-10-CM | POA: Insufficient documentation

## 2021-04-06 DIAGNOSIS — M7989 Other specified soft tissue disorders: Secondary | ICD-10-CM | POA: Diagnosis not present

## 2021-04-06 DIAGNOSIS — Z85828 Personal history of other malignant neoplasm of skin: Secondary | ICD-10-CM | POA: Insufficient documentation

## 2021-04-06 DIAGNOSIS — Z7901 Long term (current) use of anticoagulants: Secondary | ICD-10-CM | POA: Insufficient documentation

## 2021-04-06 DIAGNOSIS — Z853 Personal history of malignant neoplasm of breast: Secondary | ICD-10-CM | POA: Diagnosis not present

## 2021-04-06 DIAGNOSIS — M79641 Pain in right hand: Secondary | ICD-10-CM | POA: Diagnosis not present

## 2021-04-06 NOTE — ED Triage Notes (Signed)
Pt states she woke up Sunday morning with her left hand swollen.  Does not know of any injury and did not see any puncture or area that appeared as a bug bite.  Pt reports swelling and pain has decreased since Sunday but persists.  Hand no longer is hurting, but wrist is painful with movement.  Small area of redness and swelling noted to hand and lateral wrist.

## 2021-04-06 NOTE — Discharge Instructions (Signed)
Take Tylenol ibuprofen as needed for pain.  You can also ice the hand if it is hurting.  Follow-up with a primary care doctor if no improvement.  Return if things worsen.

## 2021-04-06 NOTE — ED Notes (Signed)
Patient verbalizes understanding of discharge instructions. Opportunity for questioning and answers were provided. Patient discharged from ED.  °

## 2021-04-06 NOTE — ED Provider Notes (Signed)
[facility] EMERGENCY DEPT Provider Note   CSN: [id number] Arrival date & time: 04/06/21  1003     History Chief Complaint  Patient presents with   Hand Problem    Jessica Mullins is a 87 y.o. female.  HPI  Patient presents with right hand pain.  Patient reports that started Sunday morning acutely when she woke up, she states her hand was swollen and painful to touch.  She also endorses color changes of erythema.  Throughout the day the swelling improved, the pain moved up to her wrist.  Feels pain whenever she makes a twisting motion like opening a bottle, otherwise did not have any pain at all.  The swelling has improved, but there are still skin color changes which concerned her.  Denies any changes in medicine, no trauma to the hand, no bug bites or environmental exposures that she is aware of.  She is not having any fevers at home.  Denies any numbness or tingling to the hand.  Past Medical History:  Diagnosis Date   Allergy    Ankle fracture, left    Atrial fibrillation ([place])    Per [facility] New Patient Packet    Breast cancer ([place]) 1115/16   left    Bursitis of right shoulder    Cataract    Family history of cancer    Fracture of right wrist    GERD (gastroesophageal reflux disease)    Hyperlipidemia    Hypertension    Lumbar radiculitis    Per [place] New Patient Packet    Meningioma [facility])    Per [facility] New Patient Packet    Skin cancer 2000   melanoma and basal cell   Stress fracture    Right Heel, Per [facility] New Patient Packet    Vaso vagal episode    during preparation for colonoscopy    Patient Active Problem List   Diagnosis Date Noted   Senile osteopenia 01/12/2020   History of breast cancer 01/12/2020   Meningioma ([place]) 01/12/2020   History of melanoma 01/12/2020   Right lumbar radiculitis 01/12/2020   Balance problem 04/05/2019   Constipation by delayed colonic transit 12/13/2017   Hemorrhoids 02/14/2017   Atrial fibrillation ([place]) 08/10/2015    Orthostatic hypotension 08/04/2015   Leukocytosis 07/27/2015   Genetic testing 07/27/2015   Family history of cancer    Cancer of central portion of left female breast ([place]) 06/01/2015   Bilateral low back pain without sciatica 11/18/2014   Right shoulder pain 04/10/2014   Muscle spasm of back 01/13/2014   Olecranon bursitis of left elbow 08/26/2013   Posterolateral cervical muscle strain 12/14/2012   Hyperlipidemia 02/16/2010   TMJ PAIN 02/16/2010   SEBORRHEIC KERATOSIS 08/24/2009   Essential hypertension 10/18/2007   OSTEOPENIA 07/12/2007   Melanoma of skin ([place]) 10/17/2006   BREAST CYST 10/17/2006   COLONOSCOPY, HX OF 10/17/2006    Past Surgical History:  Procedure Laterality Date   ABDOMINAL HYSTERECTOMY  1988   TAH/BSO--FIBROIDS   APPENDECTOMY     BREAST LUMPECTOMY     B/L--FCS   BREAST LUMPECTOMY WITH RADIOACTIVE SEED AND SENTINEL LYMPH NODE BIOPSY Left 06/11/2015   Procedure: LEFT BREAST LUMPECTOMY WITH RADIOACTIVE SEED AND LEFT SENTINEL LYMPH NODE MAPPING;  Surgeon: Erroll Luna, MD;  Location: [facility];  Service: General;  Laterality: Left;   CATARACT EXTRACTION  2015   COLONOSCOPY  2010   DG  BONE DENSITY ([place] HX)     EYE SURGERY Bilateral    cataracts  fiberadenoma Bilateral 1978, 1980   GANGLION CYST EXCISION     L  hand   Lipiflow procedure       OB History   No obstetric history on file.     Family History  Problem Relation Age of Onset   Cancer Mother        mets to bone--? primary   Coronary artery disease Brother        died of M! @ 25   Diabetes Brother    Hyperlipidemia Brother    Hypertension Brother    Heart disease Brother        chf   Heart disease Father 63       MI   Lung cancer Sister 1       former smoker   Diabetes Maternal Aunt    Lung cancer Other    Thrombosis Other        thromboembolism clotting disorder--? father died of ? clot    Hypertension Son    High Cholesterol Son     Social History   Tobacco Use    Smoking status: Never   Smokeless tobacco: Never  Vaping Use   Vaping Use: Never used  Substance Use Topics   Alcohol use: Yes    Comment: rare   Drug use: No    Home Medications Prior to Admission medications   Medication Sig Start Date End Date Taking? Authorizing Provider  AMBULATORY NON FORMULARY MEDICATION Walker use daily as directed.  Disp 1 Leg pain M79.604 05/21/19   Gregor Hams, MD  Biotin 5 MG CAPS Take 5 mg by mouth daily.     [provider]  Calcium Carbonate-Vitamin D 600-400 MG-UNIT tablet Take 1 tablet by mouth daily.    [provider]  Cholecalciferol (VITAMIN D3) 1000 UNITS CAPS Take 1 capsule by mouth daily.    [provider]  COVID-19 mRNA Vac-TriS, Pfizer, (PFIZER-BIONT COVID-19 VAC-TRIS) SUSP injection Inject into the muscle. 01/05/21   Carlyle Basques, MD  cycloSPORINE (RESTASIS) 0.05 % ophthalmic emulsion PLACE 1 DROP INTO BOTH EYES TWO (TWO) TIMES DAILY. 10/11/16   [provider]  diltiazem (CARDIZEM) 30 MG tablet TAKE 1 TABLET BY MOUTH TWICE DAILY. 12/03/20   Croitoru, Mihai, MD  ELIQUIS 5 MG TABS tablet TAKE 1 TABLET BY MOUTH TWICE DAILY. 01/11/21   Nicholas Lose, MD  flecainide (TAMBOCOR) 50 MG tablet Take 1 tablet (50 mg total) by mouth 2 (two) times daily. 07/08/20   Croitoru, Mihai, MD  ketoconazole (NIZORAL) 2 % cream APPLY TO AFFECTED AREA ONCE DAILY AS NEEDED FOR ITCHING. 03/04/21   Virgie Dad, MD  methocarbamol (ROBAXIN) 500 MG tablet Take 1 tablet (500 mg total) by mouth 3 (three) times daily as needed (muscle spasm/pain). 12/14/20   Royal Hawthorn, NP  Multiple Vitamins-Minerals (PRESERVISION AREDS) TABS Take 2 tablets by mouth daily.    [provider]  Omega-3 Fatty Acids (FISH OIL PO) Take 1 tablet by mouth daily.     [provider]  polyethylene glycol (MIRALAX / GLYCOLAX) 17 g packet Take 17 g by mouth daily.    [provider]  pravastatin (PRAVACHOL) 40 MG tablet TAKE 1 TABLET  ONCE DAILY. 11/17/20   Virgie Dad, MD  Psyllium (METAMUCIL PO) Take 15 mLs by mouth daily.     [provider]  Turmeric 500 MG CAPS Take 1 capsule by mouth daily.     [provider]  vitamin B-12 (CYANOCOBALAMIN) 1000 MCG  tablet Take 1 tablet (1,000 mcg total) by mouth daily. 07/19/18   Nicholas Lose, MD    Allergies    Sulfa antibiotics, Tape, Glucosamine forte [nutritional supplements], and Latex  Review of Systems   Review of Systems  Constitutional:  Negative for fatigue and fever.  Musculoskeletal:  Positive for joint swelling and myalgias.  Skin:  Positive for color change.  Neurological:  Negative for numbness.   Physical Exam Updated Vital Signs BP (!) 187/71 (BP Location: Left Arm)   Pulse 60   Temp 97.7 F (36.5 C) (Oral)   Resp 16   Ht 5\' 1"  (1.549 m)   Wt 72.1 kg   SpO2 99%   BMI 30.04 kg/m   Physical Exam Vitals and nursing note reviewed. Exam conducted with a chaperone present.  Constitutional:      General: She is not in acute distress.    Appearance: Normal appearance.  HENT:     Head: Normocephalic and atraumatic.  Eyes:     General: No scleral icterus.    Extraocular Movements: Extraocular movements intact.     Pupils: Pupils are equal, round, and reactive to light.  Cardiovascular:     Pulses: Normal pulses.     Comments: Radial pulse 2+ bilaterally Musculoskeletal:        General: No swelling or tenderness. Normal range of motion.  Skin:    General: Skin is warm.     Capillary Refill: Capillary refill takes less than 2 seconds.     Coloration: Skin is not jaundiced.     Findings: Erythema present.  Neurological:     Mental Status: She is alert. Mental status is at baseline.     Coordination: Coordination normal.     Comments: Sensation light touch grossly intact to the upper extremities bilaterally     ED Results / Procedures / Treatments   Labs (all labs ordered are listed, but only abnormal results are  displayed) Labs Reviewed - No data to display  EKG None  Radiology DG Wrist Complete Right  Result Date: 04/06/2021 CLINICAL DATA:  Pain, swelling.  No known injury. EXAM: RIGHT WRIST - COMPLETE 3+ VIEW COMPARISON:  None. FINDINGS: Degenerative changes in the 1st carpometacarpal joint. No acute bony abnormality. Specifically, no fracture, subluxation, or dislocation. IMPRESSION: No acute bony abnormality. Electronically Signed   By: Rolm Baptise M.D.   On: 04/06/2021 12:00    Procedures Procedures   Medications Ordered in ED Medications - No data to display  ED Course  I have reviewed the triage vital signs and the nursing notes.  Pertinent labs & imaging results that were available during my care of the patient were reviewed by me and considered in my medical decision making (see chart for details).    MDM Rules/Calculators/A&P                           Patient is mildly hypertensive, otherwise vitals are stable.  Do not suspect any vascular pathology given radial pulse and Good cap refill.  Sensation is fully intact, no injury that would be suggestive of nerve palsy.  She has good range of motion, not consistent with fracture dislocation.  Radiograph ordered to assess for that versus osteomyelitis.  Patient does not have fever, she is able to tolerate pressure and range of motion so doubt a septic joint.  Radiograph is negative for any emergent pathology.  Exam is not consistent with cellulitis.  Patient could have  gout flare, no history of gout however.  Unclear etiology of her symptoms, but given no pain anymore and that the swelling is improving with time I doubt there is anything emergent.  Will have patient follow-up with your primary care doctor if things continue.  Final Clinical Impression(s) / ED Diagnoses Final diagnoses:  None    Rx / DC Orders ED Discharge Orders     None        Sherrill Raring, Hershal Coria 04/06/21 1608    Davonna Belling, MD 04/06/21 2355

## 2021-04-08 ENCOUNTER — Ambulatory Visit: Payer: Medicare PPO | Admitting: Neurology

## 2021-04-21 ENCOUNTER — Other Ambulatory Visit: Payer: Self-pay | Admitting: Hematology and Oncology

## 2021-04-21 DIAGNOSIS — I4819 Other persistent atrial fibrillation: Secondary | ICD-10-CM

## 2021-05-03 ENCOUNTER — Other Ambulatory Visit: Payer: Self-pay | Admitting: Cardiovascular Disease

## 2021-05-06 ENCOUNTER — Telehealth (INDEPENDENT_AMBULATORY_CARE_PROVIDER_SITE_OTHER): Payer: Medicare PPO | Admitting: Nurse Practitioner

## 2021-05-06 ENCOUNTER — Telehealth: Payer: Self-pay

## 2021-05-06 ENCOUNTER — Other Ambulatory Visit: Payer: Self-pay

## 2021-05-06 DIAGNOSIS — M549 Dorsalgia, unspecified: Secondary | ICD-10-CM

## 2021-05-06 DIAGNOSIS — M159 Polyosteoarthritis, unspecified: Secondary | ICD-10-CM

## 2021-05-06 NOTE — Progress Notes (Signed)
Careteam: Patient Care Team: Virgie Dad, MD as PCP - General (Internal Medicine) Croitoru, Dani Gobble, MD as PCP - Cardiology (Cardiology) Allyn Kenner, MD as Consulting Physician (Dermatology) Calvert Cantor, MD as Consulting Physician (Ophthalmology) Pieter Partridge, DO as Consulting Physician (Neurology) Delice Bison, Charlestine Massed, NP as Nurse Practitioner (Hematology and Oncology)  Advanced Directive information    Allergies  Allergen Reactions   Sulfa Antibiotics Other (See Comments)    As a child, rigid as a stick and not responsive   Tape Other (See Comments)    Blisters, Please use "paper" tape only for short periods of time   Glucosamine Forte [Nutritional Supplements] Rash   Latex Rash    Chief Complaint  Patient presents with   Acute Visit    Neck pain that comes and goes. Patient states that she has pain all over. Patient has had it seldomly, but now more frequently.Patient states pain level right now is about a 9. She is having pain in wrist and hand.     HPI: Patient is a 87 y.o. female due to pain.  Reports she is now having pain all over off and on.  Reports it started a few months ago and then would go away for a while. This week is staying.  Right hand and wrist is bothering her today. She reports she does not remember going to the ED due to pain in right hand.  States she was seen due to her neck being stiff.  Pain worse in the morning and eases up by afternoon. Not using tylenol during the day. No injury or recent falls.   She has hx of DDD in lumbar spine. Does heat or cold also has muscle rub.  Exercises have become too painful.   Review of Systems:  Review of Systems  Constitutional:  Negative for chills and fever.  Musculoskeletal:  Positive for back pain, joint pain, myalgias and neck pain.    Past Medical History:  Diagnosis Date   Allergy    Ankle fracture, left    Atrial fibrillation ([place])    Per [facility] New Patient Packet    Breast cancer  ([place]) 1115/16   left    Bursitis of right shoulder    Cataract    Family history of cancer    Fracture of right wrist    GERD (gastroesophageal reflux disease)    Hyperlipidemia    Hypertension    Lumbar radiculitis    Per [place] New Patient Packet    Meningioma [facility])    Per [place] New Patient Packet    Skin cancer 2000   melanoma and basal cell   Stress fracture    Right Heel, Per [place] New Patient Packet    Vaso vagal episode    during preparation for colonoscopy   Past Surgical History:  Procedure Laterality Date   ABDOMINAL HYSTERECTOMY  1988   TAH/BSO--FIBROIDS   APPENDECTOMY     BREAST LUMPECTOMY     B/L--FCS   BREAST LUMPECTOMY WITH RADIOACTIVE SEED AND SENTINEL LYMPH NODE BIOPSY Left 06/11/2015   Procedure: LEFT BREAST LUMPECTOMY WITH RADIOACTIVE SEED AND LEFT SENTINEL LYMPH NODE MAPPING;  Surgeon: Erroll Luna, MD;  Location: [facility];  Service: General;  Laterality: Left;   CATARACT EXTRACTION  2015   COLONOSCOPY  2010   DG  BONE DENSITY ([place] HX)     EYE SURGERY Bilateral    cataracts   fiberadenoma Bilateral 1978, 1980   GANGLION CYST EXCISION  L  hand   Lipiflow procedure     Social History:   reports that she has never smoked. She has never used smokeless tobacco. She reports current alcohol use. She reports that she does not use drugs.  Family History  Problem Relation Age of Onset   Cancer Mother        mets to bone--? primary   Coronary artery disease Brother        died of M! @ 43   Diabetes Brother    Hyperlipidemia Brother    Hypertension Brother    Heart disease Brother        chf   Heart disease Father 68       MI   Lung cancer Sister 79       former smoker   Diabetes Maternal Aunt    Lung cancer Other    Thrombosis Other        thromboembolism clotting disorder--? father died of ? clot    Hypertension Son    High Cholesterol Son     Medications: Patient's Medications  New Prescriptions   No medications on file  Previous Medications    AMBULATORY NON FORMULARY MEDICATION    Walker use daily as directed.  Disp 1 Leg pain M79.604   BIOTIN 5 MG CAPS    Take 5 mg by mouth daily.    CALCIUM CARBONATE-VITAMIN D 600-400 MG-UNIT TABLET    Take 1 tablet by mouth daily.   CHOLECALCIFEROL (VITAMIN D3) 1000 UNITS CAPS    Take 1 capsule by mouth daily.   COVID-19 MRNA VAC-TRIS, PFIZER, (PFIZER-BIONT COVID-19 VAC-TRIS) SUSP INJECTION    Inject into the muscle.   CYCLOSPORINE (RESTASIS) 0.05 % OPHTHALMIC EMULSION    PLACE 1 DROP INTO BOTH EYES TWO (TWO) TIMES DAILY.   DILTIAZEM (CARDIZEM) 30 MG TABLET    TAKE 1 TABLET BY MOUTH TWICE DAILY.   ELIQUIS 5 MG TABS TABLET    TAKE 1 TABLET BY MOUTH TWICE DAILY.   FLECAINIDE (TAMBOCOR) 50 MG TABLET    Take 1 tablet (50 mg total) by mouth 2 (two) times daily.   KETOCONAZOLE (NIZORAL) 2 % CREAM    APPLY TO AFFECTED AREA ONCE DAILY AS NEEDED FOR ITCHING.   METHOCARBAMOL (ROBAXIN) 500 MG TABLET    Take 1 tablet (500 mg total) by mouth 3 (three) times daily as needed (muscle spasm/pain).   MULTIPLE VITAMINS-MINERALS (PRESERVISION AREDS) TABS    Take 2 tablets by mouth daily.   OMEGA-3 FATTY ACIDS (FISH OIL PO)    Take 1 tablet by mouth daily.    POLYETHYLENE GLYCOL (MIRALAX / GLYCOLAX) 17 G PACKET    Take 17 g by mouth daily.   PRAVASTATIN (PRAVACHOL) 40 MG TABLET    TAKE 1 TABLET ONCE DAILY.   PSYLLIUM (METAMUCIL PO)    Take 15 mLs by mouth daily.    TURMERIC 500 MG CAPS    Take 1 capsule by mouth daily.    VITAMIN B-12 (CYANOCOBALAMIN) 1000 MCG TABLET    Take 1 tablet (1,000 mcg total) by mouth daily.  Modified Medications   No medications on file  Discontinued Medications   No medications on file    Physical Exam:  There were no vitals filed for this visit. There is no height or weight on file to calculate BMI. Wt Readings from Last 3 Encounters:  04/06/21 159 lb (72.1 kg)  02/17/21 158 lb 6.4 oz (71.8 kg)  12/14/20 160 lb (72.6 kg)  Labs reviewed: Basic Metabolic Panel: Recent  Labs    07/16/20 0000 02/11/21 0000  NA 142 143  K 4.4 4.4  CL 103 105  CO2 31* 26*  BUN 15 17  CREATININE 0.7 0.7  CALCIUM 10.4 9.8  TSH  --  2.99   Liver Function Tests: Recent Labs    07/16/20 0000 02/11/21 0000  AST 19 19  ALT 19 17  ALKPHOS  --  60  ALBUMIN 4.4 4.4   No results for input(s): LIPASE, AMYLASE in the last 8760 hours. No results for input(s): AMMONIA in the last 8760 hours. CBC: Recent Labs    07/16/20 0000 02/11/21 0000  WBC 6.1 5.4  HGB 15.5 15.5  HCT  --  45  PLT  --  210   Lipid Panel: Recent Labs    07/16/20 0000 02/11/21 0000  CHOL 152 156  HDL 74* 66  LDLCALC 65 75  TRIG 69 73   TSH: Recent Labs    02/11/21 0000  TSH 2.99   A1C: No results found for: HGBA1C   Assessment/Plan 1. Primary osteoarthritis involving multiple joints -ongoing, encouraged to continue exercise, heat, muscle rub and tylenol PRN.   2. Upper back pain -continue to use heat, muscle rub and exercise. She declines PT at this time.  -can use tylenol 1000 mg by mouth every 8 hours as needed     Neco Kling K. Harle Battiest  [facility] [phone]    Virtual Visit via mychart video  I connected with patient on 05/06/21 at 10:30 AM EST by video and verified that I am speaking with the correct person using two identifiers.  Location: Patient: home Provider: [facility] [place]   I discussed the limitations, risks, security and privacy concerns of performing an evaluation and management service by telephone and the availability of in person appointments. I also discussed with the patient that there may be a patient responsible charge related to this service. The patient expressed understanding and agreed to proceed.   I discussed the assessment and treatment plan with the patient. The patient was provided an opportunity to ask questions and all were answered. The patient agreed with the plan and demonstrated an understanding of the  instructions.   The patient was advised to call back or seek an in-person evaluation if the symptoms worsen or if the condition fails to improve as anticipated.  I provided 16 minutes of non-face-to-face time during this encounter.  Carlos American. Harle Battiest Avs printed and mailed

## 2021-05-06 NOTE — Telephone Encounter (Signed)
Ms. nasia, cannan are scheduled for a virtual visit with your provider today.    Just as we do with appointments in the office, we must obtain your consent to participate.  Your consent will be active for this visit and any virtual visit you may have with one of our providers in the next 365 days.    If you have a MyChart account, I can also send a copy of this consent to you electronically.  All virtual visits are billed to your insurance company just like a traditional visit in the office.  As this is a virtual visit, video technology does not allow for your provider to perform a traditional examination.  This may limit your provider's ability to fully assess your condition.  If your provider identifies any concerns that need to be evaluated in person or the need to arrange testing such as labs, EKG, etc, we will make arrangements to do so.    Although advances in technology are sophisticated, we cannot ensure that it will always work on either your end or our end.  If the connection with a video visit is poor, we may have to switch to a telephone visit.  With either a video or telephone visit, we are not always able to ensure that we have a secure connection.   I need to obtain your verbal consent now.   Are you willing to proceed with your visit today?   Jessica Mullins has provided verbal consent on 05/06/2021 for a virtual visit (video or telephone).   Carroll Kinds, CMA 05/06/2021  10:35 AM

## 2021-05-06 NOTE — Progress Notes (Signed)
This service is provided via telemedicine  No vital signs collected/recorded due to the encounter was a telemedicine visit.   Location of patient (ex: home, work):  Home  Patient consents to a telephone visit:  Yes, see encounter dated 05/06/2021  Location of the provider (ex: office, home):  [place]  Name of any referring provider:  Veleta Miners, MD  Names of all persons participating in the telemedicine service and their role in the encounter:  Sherrie Mustache, Nurse Practitioner, Carroll Kinds, CMA, and patient.   Time spent on call:  11 minutes with medical assistant

## 2021-05-10 ENCOUNTER — Observation Stay (HOSPITAL_COMMUNITY): Payer: Medicare PPO

## 2021-05-10 ENCOUNTER — Encounter: Payer: Medicare PPO | Admitting: Adult Health

## 2021-05-10 ENCOUNTER — Emergency Department (HOSPITAL_BASED_OUTPATIENT_CLINIC_OR_DEPARTMENT_OTHER): Payer: Medicare PPO

## 2021-05-10 ENCOUNTER — Encounter (HOSPITAL_BASED_OUTPATIENT_CLINIC_OR_DEPARTMENT_OTHER): Payer: Self-pay | Admitting: Emergency Medicine

## 2021-05-10 ENCOUNTER — Encounter (HOSPITAL_COMMUNITY): Payer: Self-pay | Admitting: Family Medicine

## 2021-05-10 ENCOUNTER — Encounter: Payer: Self-pay | Admitting: Adult Health

## 2021-05-10 ENCOUNTER — Observation Stay (HOSPITAL_BASED_OUTPATIENT_CLINIC_OR_DEPARTMENT_OTHER)
Admission: EM | Admit: 2021-05-10 | Discharge: 2021-05-11 | Disposition: A | Discharge status: Home / Self Care | Payer: Medicare PPO | Attending: Internal Medicine | Admitting: Internal Medicine

## 2021-05-10 ENCOUNTER — Other Ambulatory Visit: Payer: Self-pay

## 2021-05-10 ENCOUNTER — Emergency Department (HOSPITAL_COMMUNITY): Payer: Medicare PPO

## 2021-05-10 DIAGNOSIS — I10 Essential (primary) hypertension: Secondary | ICD-10-CM | POA: Diagnosis not present

## 2021-05-10 DIAGNOSIS — G935 Compression of brain: Secondary | ICD-10-CM | POA: Diagnosis not present

## 2021-05-10 DIAGNOSIS — G9520 Unspecified cord compression: Secondary | ICD-10-CM | POA: Diagnosis not present

## 2021-05-10 DIAGNOSIS — I639 Cerebral infarction, unspecified: Secondary | ICD-10-CM | POA: Diagnosis present

## 2021-05-10 DIAGNOSIS — D329 Benign neoplasm of meninges, unspecified: Secondary | ICD-10-CM | POA: Diagnosis not present

## 2021-05-10 DIAGNOSIS — R42 Dizziness and giddiness: Secondary | ICD-10-CM | POA: Diagnosis not present

## 2021-05-10 DIAGNOSIS — I4891 Unspecified atrial fibrillation: Secondary | ICD-10-CM | POA: Diagnosis not present

## 2021-05-10 DIAGNOSIS — Z91040 Latex allergy status: Secondary | ICD-10-CM | POA: Diagnosis not present

## 2021-05-10 DIAGNOSIS — G9389 Other specified disorders of brain: Secondary | ICD-10-CM | POA: Diagnosis not present

## 2021-05-10 DIAGNOSIS — Z79899 Other long term (current) drug therapy: Secondary | ICD-10-CM | POA: Diagnosis not present

## 2021-05-10 DIAGNOSIS — H532 Diplopia: Secondary | ICD-10-CM | POA: Diagnosis not present

## 2021-05-10 DIAGNOSIS — Z20822 Contact with and (suspected) exposure to covid-19: Secondary | ICD-10-CM | POA: Diagnosis not present

## 2021-05-10 DIAGNOSIS — I480 Paroxysmal atrial fibrillation: Secondary | ICD-10-CM | POA: Diagnosis not present

## 2021-05-10 DIAGNOSIS — I679 Cerebrovascular disease, unspecified: Secondary | ICD-10-CM | POA: Diagnosis not present

## 2021-05-10 DIAGNOSIS — E785 Hyperlipidemia, unspecified: Secondary | ICD-10-CM | POA: Diagnosis not present

## 2021-05-10 DIAGNOSIS — H538 Other visual disturbances: Principal | ICD-10-CM | POA: Insufficient documentation

## 2021-05-10 DIAGNOSIS — R531 Weakness: Secondary | ICD-10-CM | POA: Diagnosis not present

## 2021-05-10 DIAGNOSIS — R29818 Other symptoms and signs involving the nervous system: Secondary | ICD-10-CM | POA: Diagnosis not present

## 2021-05-10 DIAGNOSIS — I6523 Occlusion and stenosis of bilateral carotid arteries: Secondary | ICD-10-CM | POA: Diagnosis not present

## 2021-05-10 DIAGNOSIS — H5310 Unspecified subjective visual disturbances: Secondary | ICD-10-CM | POA: Diagnosis not present

## 2021-05-10 LAB — DIFFERENTIAL
Abs Immature Granulocytes: 0.01 10*3/uL (ref 0.00–0.07)
Basophils Absolute: 0 10*3/uL (ref 0.0–0.1)
Basophils Relative: 0 %
Eosinophils Absolute: 0.1 10*3/uL (ref 0.0–0.5)
Eosinophils Relative: 1 %
Immature Granulocytes: 0 %
Lymphocytes Relative: 22 %
Lymphs Abs: 1.7 10*3/uL (ref 0.7–4.0)
Monocytes Absolute: 0.5 10*3/uL (ref 0.1–1.0)
Monocytes Relative: 7 %
Neutro Abs: 5.4 10*3/uL (ref 1.7–7.7)
Neutrophils Relative %: 70 %

## 2021-05-10 LAB — COMPREHENSIVE METABOLIC PANEL
ALT: 13 U/L (ref 0–44)
AST: 19 U/L (ref 15–41)
Albumin: 4.3 g/dL (ref 3.5–5.0)
Alkaline Phosphatase: 46 U/L (ref 38–126)
Anion gap: 8 (ref 5–15)
BUN: 13 mg/dL (ref 8–23)
CO2: 28 mmol/L (ref 22–32)
Calcium: 9.8 mg/dL (ref 8.9–10.3)
Chloride: 104 mmol/L (ref 98–111)
Creatinine, Ser: 0.76 mg/dL (ref 0.44–1.00)
GFR, Estimated: >60 mL/min (ref >60)
Glucose, Bld: 92 mg/dL (ref 70–99)
Potassium: 3.9 mmol/L (ref 3.5–5.1)
Sodium: 140 mmol/L (ref 135–145)
Total Bilirubin: 0.7 mg/dL (ref 0.3–1.2)
Total Protein: 6.4 g/dL — ABNORMAL LOW (ref 6.5–8.1)

## 2021-05-10 LAB — CBC
HCT: 45.4 % (ref 36.0–46.0)
Hemoglobin: 15.3 g/dL — ABNORMAL HIGH (ref 12.0–15.0)
MCH: 31 pg (ref 26.0–34.0)
MCHC: 33.7 g/dL (ref 30.0–36.0)
MCV: 92.1 fL (ref 80.0–100.0)
Platelets: 193 10*3/uL (ref 150–400)
RBC: 4.93 MIL/uL (ref 3.87–5.11)
RDW: 14.5 % (ref 11.5–15.5)
WBC: 7.7 10*3/uL (ref 4.0–10.5)
nRBC: 0 % (ref 0.0–0.2)

## 2021-05-10 LAB — RESP PANEL BY RT-PCR (FLU A&B, COVID) ARPGX2
Influenza A by PCR: NEGATIVE
Influenza B by PCR: NEGATIVE
SARS Coronavirus 2 by RT PCR: NEGATIVE

## 2021-05-10 LAB — APTT: aPTT: 33 seconds (ref 24–36)

## 2021-05-10 LAB — ETHANOL: Alcohol, Ethyl (B): <10 mg/dL (ref <10)

## 2021-05-10 LAB — PROTIME-INR
INR: 1.2 (ref 0.8–1.2)
Prothrombin Time: 15 seconds (ref 11.4–15.2)

## 2021-05-10 MED ORDER — CYCLOSPORINE 0.05 % OP EMUL
1.0000 [drp] | Freq: Two times a day (BID) | OPHTHALMIC | Status: DC
  Administered 2021-05-10 – 2021-05-11 (×2): 1 [drp] via OPHTHALMIC
  Filled 2021-05-10 (×3): qty 30

## 2021-05-10 MED ORDER — STROKE: EARLY STAGES OF RECOVERY BOOK
Freq: Once | Status: DC
  Filled 2021-05-10: qty 1

## 2021-05-10 MED ORDER — PRAVASTATIN SODIUM 40 MG PO TABS
40.0000 mg | ORAL_TABLET | Freq: Every day | ORAL | Status: DC
  Administered 2021-05-11: 40 mg via ORAL
  Filled 2021-05-10 (×2): qty 1

## 2021-05-10 MED ORDER — IOHEXOL 350 MG/ML SOLN
75.0000 mL | Freq: Once | INTRAVENOUS | Status: AC | PRN
  Administered 2021-05-10: 75 mL via INTRAVENOUS

## 2021-05-10 MED ORDER — ACETAMINOPHEN 325 MG PO TABS
650.0000 mg | ORAL_TABLET | ORAL | Status: DC | PRN
  Administered 2021-05-10: 650 mg via ORAL
  Filled 2021-05-10: qty 2

## 2021-05-10 MED ORDER — ACETAMINOPHEN 160 MG/5ML PO SOLN: 650.0000 mg | ORAL | Status: DC | PRN

## 2021-05-10 MED ORDER — GADOBUTROL 1 MMOL/ML IV SOLN
7.0000 mL | Freq: Once | INTRAVENOUS | Status: AC | PRN
  Administered 2021-05-10: 7 mL via INTRAVENOUS

## 2021-05-10 MED ORDER — APIXABAN 5 MG PO TABS
5.0000 mg | ORAL_TABLET | Freq: Two times a day (BID) | ORAL | Status: DC
  Administered 2021-05-10 – 2021-05-11 (×2): 5 mg via ORAL
  Filled 2021-05-10 (×2): qty 1

## 2021-05-10 MED ORDER — FLECAINIDE ACETATE 50 MG PO TABS
50.0000 mg | ORAL_TABLET | Freq: Two times a day (BID) | ORAL | Status: DC
  Administered 2021-05-10 – 2021-05-11 (×2): 50 mg via ORAL
  Filled 2021-05-10 (×3): qty 1

## 2021-05-10 MED ORDER — ACETAMINOPHEN 650 MG RE SUPP: 650.0000 mg | RECTAL | Status: DC | PRN

## 2021-05-10 MED ORDER — DILTIAZEM HCL 30 MG PO TABS: 30.0000 mg | ORAL_TABLET | Freq: Two times a day (BID) | ORAL | Status: DC

## 2021-05-10 MED ORDER — SENNOSIDES-DOCUSATE SODIUM 8.6-50 MG PO TABS: 1.0000 | ORAL_TABLET | Freq: Every evening | ORAL | Status: DC | PRN

## 2021-05-10 NOTE — ED Triage Notes (Signed)
Pt adding that the blurry vision is intermittent and that she also has double vision.

## 2021-05-10 NOTE — ED Notes (Signed)
Patient transported to CT 

## 2021-05-10 NOTE — ED Notes (Signed)
[facility] due to brain mass and weakness.

## 2021-05-10 NOTE — ED Provider Notes (Signed)
Patient transferred from [facility] for MRI.  In summation 87 year old known history of known brain mass who presents for evaluation of vision changes, dizziness and difficulty word finding. Has some chronic low back pain. No saddle paresthesias, urine/bowel incontinence.  CT scan showed possible increase in size of known brain mass, recommend MRI. Discussed with Leonel Ramsay with Neuro PTA.   Sx current resolved.  [facility] Dr. Lyndel Safe  Physical Exam  BP (!) 132/99   Pulse 64   Temp 98 F (36.7 C)   Resp 15   Ht 5\' 1"  (1.549 m)   Wt 72.6 kg   SpO2 100%   BMI 30.23 kg/m   Physical Exam Vitals and nursing note reviewed.  Constitutional:      General: She is not in acute distress.    Appearance: She is well-developed. She is not ill-appearing or toxic-appearing.  HENT:     Head: Normocephalic and atraumatic.     Nose: Nose normal.     Mouth/Throat:     Mouth: Mucous membranes are moist.  Eyes:     Pupils: Pupils are equal, round, and reactive to light.  Cardiovascular:     Rate and Rhythm: Normal rate.     Pulses: Normal pulses.     Heart sounds: Normal heart sounds.  Pulmonary:     Effort: Pulmonary effort is normal. No respiratory distress.     Breath sounds: Normal breath sounds.  Abdominal:     General: Bowel sounds are normal. There is no distension.     Palpations: Abdomen is soft.  Musculoskeletal:        General: Normal range of motion.     Cervical back: Normal range of motion.  Skin:    General: Skin is warm and dry.     Capillary Refill: Capillary refill takes less than 2 seconds.  Neurological:     General: No focal deficit present.     Mental Status: She is alert and oriented to person, place, and time.     Comments: Equal strength and sensation No facial droop Abn finger to nose on right Neg Drift Full ROM Bl eyes  Psychiatric:        Mood and Affect: Mood normal.   ED Course/Procedures     Procedures Labs Reviewed  CBC -  Abnormal; Notable for the following components:      Result Value   Hemoglobin 15.3 (*)    All other components within normal limits  COMPREHENSIVE METABOLIC PANEL - Abnormal; Notable for the following components:   Total Protein 6.4 (*)    All other components within normal limits  RESP PANEL BY RT-PCR (FLU A&B, COVID) ARPGX2  ETHANOL  PROTIME-INR  APTT  DIFFERENTIAL  RAPID URINE DRUG SCREEN, HOSP PERFORMED  URINALYSIS, ROUTINE W REFLEX MICROSCOPIC   CT HEAD WO CONTRAST  Result Date: 05/10/2021 CLINICAL DATA:  Focal neuro deficit greater than 6 hours. Stroke suspected. Blurred vision in right eye since yesterday. Dizziness and generalized weakness. History of meningioma at the craniocervical junction. The EXAM: CT HEAD WITHOUT CONTRAST TECHNIQUE: Contiguous axial images were obtained from the base of the skull through the vertex without intravenous contrast. COMPARISON:  CT head 10/05/2019.  MRI brain 12/10/2020. FINDINGS: Brain: The known large posterior fossa mass extending into the foramen magnum appears slightly larger compared with 2021 CT, measuring 2.9 x 2.0 cm transverse on image 4/2 and 2.8 cm on coronal image 21/4. The appearance is similar to the more recent MRI of  5 months ago. There is significant chronic mass effect on the brainstem which is displaced posteriorly to the left. There is no evidence of acute intracranial hemorrhage, new mass lesion, brain edema or extra-axial fluid collection. The ventricles and subarachnoid spaces are appropriately sized for age. There is no CT evidence of acute cortical infarction. There are stable chronic small vessel ischemic changes in the periventricular white matter. Vascular: Intracranial vascular calcifications. No hyperdense vessel identified. Skull: Negative for fracture or focal lesion. Sinuses/Orbits: The visualized paranasal sinuses and mastoid air cells are clear. No orbital abnormalities are seen. Other: Stable postsurgical changes within  both globes. IMPRESSION: 1. No evidence of acute intracranial hemorrhage or acute stroke. 2. Known large mass in the posterior fossa with extension into the foramen magnum appears slightly larger from CT 10/05/2019, although not grossly changed from the more recent MRI of 5 months ago. This exerts significant mass effect on the brainstem and may be symptomatic. No associated brain edema or increased ventriculomegaly. 3. Grossly stable chronic small vessel ischemic changes. Electronically Signed   By: Richardean Sale M.D.   On: 05/10/2021 09:08   MR Brain W and Wo Contrast  Result Date: 05/10/2021 CLINICAL DATA:  Brain mass or lesion, focal neuro deficit EXAM: MRI HEAD WITHOUT AND WITH CONTRAST TECHNIQUE: Multiplanar, multiecho pulse sequences of the brain and surrounding structures were obtained without and with intravenous contrast. CONTRAST:  101mL GADAVIST GADOBUTROL 1 MMOL/ML IV SOLN COMPARISON:  12/10/2020 FINDINGS: Brain: Punctate area of restricted diffusion in the anterior left parietal lobe cortex (series 3, image 39-40 and series 4, image 13), with ADC correlate and without definite associated increased T2 signal. Redemonstrated enhancing mass in the lower right posterior fossa, most likely a meningioma, which compresses lower brainstem and displaces the vertebral arteries. The mass measures approximately 2.9 X 2.1 x 2.5 cm (series 11, image 11 and series 13, image 12), previously 2.8 x 2.1 x 2.6 cm when remeasured similarly, overall unchanged. The mass continues to overlie the right hypoglossal canal, with regional dural thickening along the posterior clivus. There is mass effect on the lower fourth ventricle, but unchanged size and configuration the superior aspect of the fourth ventricle no definite hydrocephalus. No new enhancing mass. No cerebral mass effect or midline shift. No extra-axial collection. T2 hyperintense signal in the periventricular white matter, likely the sequela of chronic small  vessel ischemic disease. Vascular: Normal flow voids. Skull and upper cervical spine: Normal marrow signal. Sinuses/Orbits: Negative.  Status post bilateral lens replacements. Other: The mastoids are well aerated. IMPRESSION: 1. Punctate area of restricted diffusion in the anterior left parietal lobe cortex, consistent with acute infarct. 2. Overall unchanged posterior fossa mass, most likely a meningioma. These results were called by telephone at the time of interpretation on 05/10/2021 at 1:59 pm to provider Dr. Roslynn Amble , who verbally acknowledged these results. Electronically Signed   By: Merilyn Baba M.D.   On: 05/10/2021 14:02    MDM  MRI here shows a stable meningioma however does show possible acute infarct.  CONSULT with Dr. Leonel Ramsay with Neuro who reviewed imaging. Recommends medicine admit and Neuro will consult  CONSULT with Dr. Rogers Blocker with [facility] who is agreeable to evaluate patient for admission.  Discussed plan with patient. Agreeable for admission.  The patient appears reasonably stabilized for admission considering the current resources, flow, and capabilities available in the ED at this time, and I doubt any other [facility] requiring further screening and/or treatment in the ED prior to admission.  Nettie Elm, PA-C 05/10/21 1523    Valarie Merino, MD 05/11/21 [phone]

## 2021-05-10 NOTE — H&P (Signed)
History and Physical    Jessica Mullins ZJI:[id number] DOB: [birth year] DOA: 05/10/2021  PCP: Virgie Dad, MD Consultants:  cardiology: Dr. Sallyanne Kuster, neurology: Dr. Tomi Likens, dermatology: Dr. Nevada Crane  Patient coming from:  [facility]. Lives at [facility]   Chief Complaint: vision changes   HPI: Jessica Mullins is a 87 y.o. female with medical history significant of hx of left breat cancer, atrial fibrillation on eliquis, HLD, HTN who presented to Ed with vision changes.  She states she woke up this morning and was seeing double and couldn't focus so she went to ED. She also had dizziness that has been intermittent in nature, but has become more frequent in last few weeks. She has no unilateral weakness, slurred speech or droopy faces. She has noticed over the past few weeks she has been searching for the right words and trying to make them come out clearly. She has never had a stroke before.  LKW last night around 10pm.   She denies any fever/chills, headaches, chest pain or palpitations, shortness of breath or cough, stomach pain, N/V/D, leg swelling, skin rashes, dysuria or other urinary symptoms.   At baseline she drives, she is fully independent with ADLs. She seldom cooks, but has such good food at [facility] she just eats there. She enjoys book clubs, theater. She keeps her own finances.   ED Course: vitals: Afebrile, blood pressure 170/80, heart rate 59, respiratory rate 14, oxygen 97% on room air. Pertinent labs: None CT head: Showed no evidence of acute hemorrhage or stroke.  No large mass in the posterior fossa appeared slightly larger from CT in April 2021 although not grossly changed.  Does exert significant mass-effect on the brainstem and may be symptomatic. MRI brain without contrast: Punctate area of restricted diffusion in the anterior left parietal lobe cortex consistent with acute infarct.  Overall unchanged posterior fossa mass most likely a meningioma. In  ED: [facility] and [facility].  Neuro hospitalist consulted [facility] asked to admit.  Review of Systems: As per HPI; otherwise review of systems reviewed and negative.   Ambulatory Status:  Ambulates with walker   Past Medical History:  Diagnosis Date   Allergy    Ankle fracture, left    Atrial fibrillation ([place])    Per [place] New Patient Packet    Breast cancer ([place]) 1115/16   left    Bursitis of right shoulder    Cataract    Family history of cancer    Fracture of right wrist    GERD (gastroesophageal reflux disease)    Hyperlipidemia    Hypertension    Lumbar radiculitis    Per [place] New Patient Packet    Meningioma [facility])    Per [place] New Patient Packet    Skin cancer 2000   melanoma and basal cell   Stress fracture    Right Heel, Per [place] New Patient Packet    Vaso vagal episode    during preparation for colonoscopy    Past Surgical History:  Procedure Laterality Date   ABDOMINAL HYSTERECTOMY  1988   TAH/BSO--FIBROIDS   APPENDECTOMY     BREAST LUMPECTOMY     B/L--[facility]   BREAST LUMPECTOMY WITH RADIOACTIVE SEED AND SENTINEL LYMPH NODE BIOPSY Left 06/11/2015   Procedure: LEFT BREAST LUMPECTOMY WITH RADIOACTIVE SEED AND LEFT SENTINEL LYMPH NODE MAPPING;  Surgeon: Erroll Luna, MD;  Location: [facility];  Service: General;  Laterality: Left;   CATARACT EXTRACTION  2015  COLONOSCOPY  2010   DG  BONE DENSITY ([facility] HX)     EYE SURGERY Bilateral    cataracts   fiberadenoma Bilateral 1978, 1980   GANGLION CYST EXCISION     L  hand   Lipiflow procedure      Social History   Socioeconomic History   Marital status: Widowed    Spouse name: Not on file   Number of children: 2   Years of education: Not on file   Highest education level: Master's degree (e.g., MA, MS, MEng, MEd, [name], [name])  Occupational History   Occupation: Product manager: RETIRED    Comment: retired  Tobacco Use   Smoking status: Never   Smokeless tobacco: Never  Vaping Use    Vaping Use: Never used  Substance and Sexual Activity   Alcohol use: Yes    Comment: rare   Drug use: No   Sexual activity: Never  Other Topics Concern   Not on file  Social History Narrative   Patient is widowed.  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    One son one daughter   1 caffeinated beverage daily   She is right-handed.    She works out everyday to a televised [employer] 30 minute program.   No tobacco or alcohol         Per [place] New Patient Packet Abstracted on 01/03/2020      Diet: Left blank       Caffeine: Yes      Married, if yes what year: Widowed, married in [place] you live in a house, apartment, assisted living, condo, trailer, ect: Apartment      Is it one or more stories: One stories, one person       Pets: 1 Neurosurgeon      Current/Past profession: Pharmacist, hospital      Highest level or education completed: MA of Education       Exercise:   Yes             Type and how often: Cardio, chair aerobics, 3-4 times weekly          Living Will: Yes   DNR: Yes   POA/HPOA: Yes      Functional Status:   Do you have difficulty bathing or dressing yourself? Left blank   Do you have difficulty preparing food or eating? Left blank   Do you have difficulty managing your medications? Left blank   Do you have difficulty managing your finances? Left blank   Do you have difficulty affording your medications? Left blank   Social Determinants of Health   Financial Resource Strain: Not on file  Food Insecurity: Not on file  Transportation Needs: Not on file  Physical Activity: Not on file  Stress: Not on file  Social Connections: Not on file  Intimate Partner Violence: Not on file    Allergies  Allergen Reactions   Sulfa Antibiotics Other (See Comments)    As a child, rigid as a stick and not responsive   Tape Other (See Comments)    Blisters, Please use "paper" tape only for short periods of time   Glucosamine Forte [Nutritional Supplements] Rash    Latex Rash    Family History  Problem Relation Age of Onset   Cancer Mother        mets to bone--? primary   Coronary artery disease Brother  died of M! @ 74   Diabetes Brother    Hyperlipidemia Brother    Hypertension Brother    Heart disease Brother        chf   Heart disease Father 104       MI   Lung cancer Sister 41       former smoker   Diabetes Maternal Aunt    Lung cancer Other    Thrombosis Other        thromboembolism clotting disorder--? father died of ? clot    Hypertension Son    High Cholesterol Son     Prior to Admission medications   Medication Sig Start Date End Date Taking? Authorizing Provider  AMBULATORY NON FORMULARY MEDICATION Walker use daily as directed.  Disp 1 Leg pain M79.604 05/21/19   Gregor Hams, MD  Biotin 5 MG CAPS Take 5 mg by mouth daily.     [provider]  Calcium Carbonate-Vitamin D 600-400 MG-UNIT tablet Take 1 tablet by mouth daily.    [provider]  Cholecalciferol (VITAMIN D3) 1000 UNITS CAPS Take 1 capsule by mouth daily.    [provider]  COVID-19 mRNA Vac-TriS, Pfizer, (PFIZER-BIONT COVID-19 VAC-TRIS) SUSP injection Inject into the muscle. 01/05/21   Carlyle Basques, MD  cycloSPORINE (RESTASIS) 0.05 % ophthalmic emulsion PLACE 1 DROP INTO BOTH EYES TWO (TWO) TIMES DAILY. 10/11/16   [provider]  diltiazem (CARDIZEM) 30 MG tablet TAKE 1 TABLET BY MOUTH TWICE DAILY. 05/03/21   Croitoru, Mihai, MD  ELIQUIS 5 MG TABS tablet TAKE 1 TABLET BY MOUTH TWICE DAILY. 04/21/21   Nicholas Lose, MD  flecainide (TAMBOCOR) 50 MG tablet Take 1 tablet (50 mg total) by mouth 2 (two) times daily. 07/08/20   Croitoru, Mihai, MD  ketoconazole (NIZORAL) 2 % cream APPLY TO AFFECTED AREA ONCE DAILY AS NEEDED FOR ITCHING. 03/04/21   Virgie Dad, MD  methocarbamol (ROBAXIN) 500 MG tablet Take 1 tablet (500 mg total) by mouth 3 (three) times daily as needed (muscle spasm/pain). Patient not taking: No sig reported  12/14/20   Royal Hawthorn, NP  Multiple Vitamins-Minerals (PRESERVISION AREDS) TABS Take 2 tablets by mouth daily.    [provider]  Omega-3 Fatty Acids (FISH OIL PO) Take 1 tablet by mouth daily.     [provider]  polyethylene glycol (MIRALAX / GLYCOLAX) 17 g packet Take 17 g by mouth daily.    [provider]  pravastatin (PRAVACHOL) 40 MG tablet TAKE 1 TABLET ONCE DAILY. 11/17/20   Virgie Dad, MD  Psyllium (METAMUCIL PO) Take 15 mLs by mouth daily.     [provider]  Turmeric 500 MG CAPS Take 1 capsule by mouth daily.     [provider]  vitamin B-12 (CYANOCOBALAMIN) 1000 MCG tablet Take 1 tablet (1,000 mcg total) by mouth daily. 07/19/18   Nicholas Lose, MD    Physical Exam: Vitals:   05/10/21 0939 05/10/21 0945 05/10/21 1055 05/10/21 1356  BP: (!) 156/81 (!) 165/73 (!) 134/104 (!) 132/99  Pulse: (!) 55 (!) 57 61 64  Resp: 17 18 14 15   Temp:   98.2 F (36.8 C) 98 F (36.7 C)  TempSrc:   Oral   SpO2: 98% 96% 100% 100%  Weight:      Height:         General:  Appears calm and comfortable and is in NAD. Squinting left eye  Eyes:  PERRL, EOMI, normal lids, iris ENT:  grossly normal hearing, lips & tongue, mmm; appropriate dentition Neck:  no LAD, masses or thyromegaly; no carotid bruits Cardiovascular:  RRR, no m/r/g. No LE edema.  Respiratory:   CTA bilaterally with no wheezes/rales/rhonchi.  Normal respiratory effort. Abdomen:  soft, NT, ND, NABS Back:   normal alignment, no CVAT Skin:  no rash or induration seen on limited exam Musculoskeletal:  grossly normal tone BUE/BLE-5/5 strength in BUE, 4-5/5 BLE, good ROM, no bony abnormality Lower extremity:  No LE edema.  Limited foot exam with no ulcerations.  2+ distal pulses. TTP over right trapezius.  Psychiatric:  grossly normal mood and affect, speech fluent and appropriate, AOx3 Neurologic:  CN 2-12 grossly intact, moves all extremities in coordinated fashion, sensation  intact. Heel to shin intact bilaterally. Finger to nose intact. Peripheral vision intact. Can read with no difficulties. DTR 2+, gait deferred.     Radiological Exams on Admission: Independently reviewed - see discussion in A/P where applicable  CT HEAD WO CONTRAST  Result Date: 05/10/2021 CLINICAL DATA:  Focal neuro deficit greater than 6 hours. Stroke suspected. Blurred vision in right eye since yesterday. Dizziness and generalized weakness. History of meningioma at the craniocervical junction. The EXAM: CT HEAD WITHOUT CONTRAST TECHNIQUE: Contiguous axial images were obtained from the base of the skull through the vertex without intravenous contrast. COMPARISON:  CT head 10/05/2019.  MRI brain 12/10/2020. FINDINGS: Brain: The known large posterior fossa mass extending into the foramen magnum appears slightly larger compared with 2021 CT, measuring 2.9 x 2.0 cm transverse on image 4/2 and 2.8 cm on coronal image 21/4. The appearance is similar to the more recent MRI of 5 months ago. There is significant chronic mass effect on the brainstem which is displaced posteriorly to the left. There is no evidence of acute intracranial hemorrhage, new mass lesion, brain edema or extra-axial fluid collection. The ventricles and subarachnoid spaces are appropriately sized for age. There is no CT evidence of acute cortical infarction. There are stable chronic small vessel ischemic changes in the periventricular white matter. Vascular: Intracranial vascular calcifications. No hyperdense vessel identified. Skull: Negative for fracture or focal lesion. Sinuses/Orbits: The visualized paranasal sinuses and mastoid air cells are clear. No orbital abnormalities are seen. Other: Stable postsurgical changes within both globes. IMPRESSION: 1. No evidence of acute intracranial hemorrhage or acute stroke. 2. Known large mass in the posterior fossa with extension into the foramen magnum appears slightly larger from CT 10/05/2019,  although not grossly changed from the more recent MRI of 5 months ago. This exerts significant mass effect on the brainstem and may be symptomatic. No associated brain edema or increased ventriculomegaly. 3. Grossly stable chronic small vessel ischemic changes. Electronically Signed   By: Richardean Sale M.D.   On: 05/10/2021 09:08   MR Brain W and Wo Contrast  Result Date: 05/10/2021 CLINICAL DATA:  Brain mass or lesion, focal neuro deficit EXAM: MRI HEAD WITHOUT AND WITH CONTRAST TECHNIQUE: Multiplanar, multiecho pulse sequences of the brain and surrounding structures were obtained without and with intravenous contrast. CONTRAST:  64mL GADAVIST GADOBUTROL 1 MMOL/ML IV SOLN COMPARISON:  12/10/2020 FINDINGS: Brain: Punctate area of restricted diffusion in the anterior left parietal lobe cortex (series 3, image 39-40 and series 4, image 13), with ADC correlate and without definite associated increased T2 signal. Redemonstrated enhancing mass in the lower right posterior fossa, most likely a meningioma, which compresses lower brainstem and displaces the vertebral arteries. The mass measures approximately 2.9 X 2.1 x 2.5 cm (  series 11, image 11 and series 13, image 12), previously 2.8 x 2.1 x 2.6 cm when remeasured similarly, overall unchanged. The mass continues to overlie the right hypoglossal canal, with regional dural thickening along the posterior clivus. There is mass effect on the lower fourth ventricle, but unchanged size and configuration the superior aspect of the fourth ventricle no definite hydrocephalus. No new enhancing mass. No cerebral mass effect or midline shift. No extra-axial collection. T2 hyperintense signal in the periventricular white matter, likely the sequela of chronic small vessel ischemic disease. Vascular: Normal flow voids. Skull and upper cervical spine: Normal marrow signal. Sinuses/Orbits: Negative.  Status post bilateral lens replacements. Other: The mastoids are well aerated.  IMPRESSION: 1. Punctate area of restricted diffusion in the anterior left parietal lobe cortex, consistent with acute infarct. 2. Overall unchanged posterior fossa mass, most likely a meningioma. These results were called by telephone at the time of interpretation on 05/10/2021 at 1:59 pm to provider Dr. Roslynn Amble , who verbally acknowledged these results. Electronically Signed   By: Merilyn Baba M.D.   On: 05/10/2021 14:02    EKG: Independently reviewed.  NSR with rate 60; nonspecific ST changes with no evidence of acute ischemia   Labs on Admission: I have personally reviewed the available labs and imaging studies at the time of the admission.  Pertinent labs:  none    Assessment/Plan Active Problems:   Acute CVA (cerebrovascular accident) [facility]) -87 year old female presenting with double vision and lightheadedness found to have acute infarct in left anterior parietal cortex.  -place in observation on telemetry for stroke work-up -Neurochecks per protocol -Neurology consulted-f/u on recommendations  -MRI brain without contrast per above -CTA head/neck pending  -echo, a1c/lipid pending  -Currently on eliquis-continue  -Permissive hypertension first 24 hours <220/110 -N.p.o. until bedside swallow screen-passed  -PT/ OT consult    Meningioma ([place]) -appears stable from previous imaging. Has followed with neurosurgery outpatient who has just been monitoring due to age and stable appearance -f/u on neurology recommendations.     Atrial fibrillation (HCC) NSR. Continue eliquis/flecainide (hold cardizem 24 hours)  Continue telemetry     Hyperlipidemia -lipid panel pending, goal LDL <70 -continue pravachol, may benefit from zetia if not to goal or change to crestor.     Essential hypertension  -permissive HTN first 24 hours.  -hold cardizem    Body mass index is 30.23 kg/m.   Level of care: Telemetry Medical DVT prophylaxis: eliquis  Code Status: DNR - confirmed with  patient Family Communication: None present; I spoke with the patient's daughter by telephone at the time of admission. Orpah Melter  Disposition Plan:  The patient is from: [facility]   Anticipated d/c is to: [facility]   Placed in observation as anticipate less than 2 midnight stay for acute stroke work up. Requires hospitalization for monitoring, imaging, and evaluation by therapy and MDM with other specialists.     Patient is currently: stable  Consults called: neurology by edp   Admission status:  observation   Dragon dictation used in completing this note.    Orma Flaming MD [facility]   How to contact the [facility] Attending or Consulting provider [place] or covering provider during after hours [place], for this patient?  Check the care team in [facility] and look for a) attending/consulting [facility] provider listed and b) the [facility] team listed Log into [email or web address] and use [place]'s universal password to access. If you do not have the  password, please contact the hospital operator. Locate the [facility] provider you are looking for under [facility] and page to a number that you can be directly reached. If you still have difficulty reaching the provider, please page the [facility] (Director on Call) for the Hospitalists listed on amion for assistance.   05/10/2021, 3:46 PM

## 2021-05-10 NOTE — ED Provider Notes (Signed)
[facility] EMERGENCY DEPT Provider Note   CSN: [id number] Arrival date & time: 05/10/21  2297     History Chief Complaint  Patient presents with   Dizziness    Jessica Mullins is a 87 y.o. female.  87 yo F with a chief complaints of double vision difficulty finding the right word to say and recurrent right-sided low back pain.  The patient tells me that the pain has occurred every morning for some time.  Tends to get better with ambulation.  Yesterday afternoon she experienced some double vision.  She has a "lazy eye "but typically does not have double vision with it.  Has been experiencing that especially with eye movement yesterday afternoon.  She took a Tylenol PM and woke up this morning and was unable to stand.  She tells me that when she tried to do so she felt unstable.  She does have right-sided leg pain when she tries to move that leg.  She denies head injury denies neck pain.  Denies pain to the eye.  She thinks it gets better sometimes when she closes the left eye.  She denies one-sided numbness or weakness denies difficulty speech or swallowing.  Denies recent medication changes denies cough congestion or fever.  Denies abdominal pain nausea or vomiting denies decreased oral intake.  The history is provided by the patient.  Dizziness Associated symptoms: no chest pain, no headaches, no nausea, no palpitations, no shortness of breath and no vomiting   Illness Severity:  Moderate Onset quality:  Gradual Duration:  12 hours Timing:  Constant Progression:  Unchanged Chronicity:  New Associated symptoms: no chest pain, no congestion, no fever, no headaches, no myalgias, no nausea, no rhinorrhea, no shortness of breath, no vomiting and no wheezing       Past Medical History:  Diagnosis Date   Allergy    Ankle fracture, left    Atrial fibrillation ([place])    Per [facility] New Patient Packet    Breast cancer ([place]) 1115/16   left    Bursitis of right shoulder     Cataract    Family history of cancer    Fracture of right wrist    GERD (gastroesophageal reflux disease)    Hyperlipidemia    Hypertension    Lumbar radiculitis    Per [place] New Patient Packet    Meningioma [facility])    Per [facility] New Patient Packet    Skin cancer 2000   melanoma and basal cell   Stress fracture    Right Heel, Per [facility] New Patient Packet    Vaso vagal episode    during preparation for colonoscopy    Patient Active Problem List   Diagnosis Date Noted   Senile osteopenia 01/12/2020   History of breast cancer 01/12/2020   Meningioma ([place]) 01/12/2020   History of melanoma 01/12/2020   Right lumbar radiculitis 01/12/2020   Balance problem 04/05/2019   Constipation by delayed colonic transit 12/13/2017   Hemorrhoids 02/14/2017   Atrial fibrillation ([place]) 08/10/2015   Orthostatic hypotension 08/04/2015   Leukocytosis 07/27/2015   Genetic testing 07/27/2015   Family history of cancer    Cancer of central portion of left female breast ([place]) 06/01/2015   Bilateral low back pain without sciatica 11/18/2014   Right shoulder pain 04/10/2014   Muscle spasm of back 01/13/2014   Olecranon bursitis of left elbow 08/26/2013   Posterolateral cervical muscle strain 12/14/2012   Hyperlipidemia 02/16/2010   TMJ PAIN 02/16/2010   SEBORRHEIC  KERATOSIS 08/24/2009   Essential hypertension 10/18/2007   OSTEOPENIA 07/12/2007   Melanoma of skin ([place]) 10/17/2006   BREAST CYST 10/17/2006   COLONOSCOPY, HX OF 10/17/2006    Past Surgical History:  Procedure Laterality Date   ABDOMINAL HYSTERECTOMY  1988   TAH/BSO--FIBROIDS   APPENDECTOMY     BREAST LUMPECTOMY     B/L--[facility]   BREAST LUMPECTOMY WITH RADIOACTIVE SEED AND SENTINEL LYMPH NODE BIOPSY Left 06/11/2015   Procedure: LEFT BREAST LUMPECTOMY WITH RADIOACTIVE SEED AND LEFT SENTINEL LYMPH NODE MAPPING;  Surgeon: Erroll Luna, MD;  Location: [facility];  Service: General;  Laterality: Left;   CATARACT EXTRACTION  2015   COLONOSCOPY   2010   DG  BONE DENSITY ([place] HX)     EYE SURGERY Bilateral    cataracts   fiberadenoma Bilateral 1978, 1980   GANGLION CYST EXCISION     L  hand   Lipiflow procedure       OB History   No obstetric history on file.     Family History  Problem Relation Age of Onset   Cancer Mother        mets to bone--? primary   Coronary artery disease Brother        died of M! @ 48   Diabetes Brother    Hyperlipidemia Brother    Hypertension Brother    Heart disease Brother        chf   Heart disease Father 23       MI   Lung cancer Sister 70       former smoker   Diabetes Maternal Aunt    Lung cancer Other    Thrombosis Other        thromboembolism clotting disorder--? father died of ? clot    Hypertension Son    High Cholesterol Son     Social History   Tobacco Use   Smoking status: Never   Smokeless tobacco: Never  Vaping Use   Vaping Use: Never used  Substance Use Topics   Alcohol use: Yes    Comment: rare   Drug use: No    Home Medications Prior to Admission medications   Medication Sig Start Date End Date Taking? Authorizing Provider  AMBULATORY NON FORMULARY MEDICATION [place] use daily as directed.  Disp 1 Leg pain M79.604 05/21/19   Gregor Hams, MD  Biotin 5 MG CAPS Take 5 mg by mouth daily.     [provider]  Calcium Carbonate-Vitamin D 600-400 MG-UNIT tablet Take 1 tablet by mouth daily.    [provider]  Cholecalciferol (VITAMIN D3) 1000 UNITS CAPS Take 1 capsule by mouth daily.    [provider]  COVID-19 mRNA Vac-TriS, Pfizer, (PFIZER-BIONT COVID-19 VAC-TRIS) SUSP injection Inject into the muscle. 01/05/21   Carlyle Basques, MD  cycloSPORINE (RESTASIS) 0.05 % ophthalmic emulsion PLACE 1 DROP INTO BOTH EYES TWO (TWO) TIMES DAILY. 10/11/16   [provider]  diltiazem (CARDIZEM) 30 MG tablet TAKE 1 TABLET BY MOUTH TWICE DAILY. 05/03/21   Croitoru, Mihai, MD  ELIQUIS 5 MG TABS tablet TAKE 1 TABLET BY MOUTH TWICE DAILY.  04/21/21   Nicholas Lose, MD  flecainide (TAMBOCOR) 50 MG tablet Take 1 tablet (50 mg total) by mouth 2 (two) times daily. 07/08/20   Croitoru, Mihai, MD  ketoconazole (NIZORAL) 2 % cream APPLY TO AFFECTED AREA ONCE DAILY AS NEEDED FOR ITCHING. 03/04/21   Virgie Dad, MD  methocarbamol (ROBAXIN) 500 MG tablet Take 1  tablet (500 mg total) by mouth 3 (three) times daily as needed (muscle spasm/pain). Patient not taking: No sig reported 12/14/20   Royal Hawthorn, NP  Multiple Vitamins-Minerals (PRESERVISION AREDS) TABS Take 2 tablets by mouth daily.    [provider]  Omega-3 Fatty Acids (FISH OIL PO) Take 1 tablet by mouth daily.     [provider]  polyethylene glycol (MIRALAX / GLYCOLAX) 17 g packet Take 17 g by mouth daily.    [provider]  pravastatin (PRAVACHOL) 40 MG tablet TAKE 1 TABLET ONCE DAILY. 11/17/20   Virgie Dad, MD  Psyllium (METAMUCIL PO) Take 15 mLs by mouth daily.     [provider]  Turmeric 500 MG CAPS Take 1 capsule by mouth daily.     [provider]  vitamin B-12 (CYANOCOBALAMIN) 1000 MCG tablet Take 1 tablet (1,000 mcg total) by mouth daily. 07/19/18   Nicholas Lose, MD    Allergies    Sulfa antibiotics, Tape, Glucosamine forte [nutritional supplements], and Latex  Review of Systems   Review of Systems  Constitutional:  Negative for chills and fever.  HENT:  Negative for congestion and rhinorrhea.   Eyes:  Positive for visual disturbance. Negative for redness.  Respiratory:  Negative for shortness of breath and wheezing.   Cardiovascular:  Negative for chest pain and palpitations.  Gastrointestinal:  Negative for nausea and vomiting.  Genitourinary:  Negative for dysuria and urgency.  Musculoskeletal:  Negative for arthralgias and myalgias.  Skin:  Negative for pallor and wound.  Neurological:  Positive for dizziness and speech difficulty. Negative for headaches.   Physical Exam Updated Vital Signs BP (!)  156/81 (BP Location: Right Arm)   Pulse (!) 55   Temp 98.5 F (36.9 C) (Oral)   Resp 17   Ht 5\' 1"  (1.549 m)   Wt 72.6 kg   SpO2 98%   BMI 30.23 kg/m   Physical Exam Vitals and nursing note reviewed.  Constitutional:      General: She is not in acute distress.    Appearance: She is well-developed. She is not diaphoretic.  HENT:     Head: Normocephalic and atraumatic.  Eyes:     Pupils: Pupils are equal, round, and reactive to light.  Cardiovascular:     Rate and Rhythm: Normal rate and regular rhythm.     Heart sounds: No murmur heard.   No friction rub. No gallop.  Pulmonary:     Effort: Pulmonary effort is normal.     Breath sounds: No wheezing or rales.  Abdominal:     General: There is no distension.     Palpations: Abdomen is soft.     Tenderness: There is no abdominal tenderness.  Musculoskeletal:        General: No tenderness.     Cervical back: Normal range of motion and neck supple.  Skin:    General: Skin is warm and dry.  Neurological:     Mental Status: She is alert and oriented to person, place, and time.     Cranial Nerves: Cranial nerves 2-12 are intact.     Sensory: Sensation is intact.     Motor: Motor function is intact.     Comments: Patient is able to maintain a seated position with her arms crossed across her chest without ataxia.  She does have past-pointing with the right upper extremity especially in the right upper visual fields.  Psychiatric:        Behavior: Behavior  normal.    ED Results / Procedures / Treatments   Labs (all labs ordered are listed, but only abnormal results are displayed) Labs Reviewed  CBC - Abnormal; Notable for the following components:      Result Value   Hemoglobin 15.3 (*)    All other components within normal limits  RESP PANEL BY RT-PCR (FLU A&B, COVID) ARPGX2  PROTIME-INR  APTT  DIFFERENTIAL  ETHANOL  COMPREHENSIVE METABOLIC PANEL  RAPID URINE DRUG SCREEN, HOSP PERFORMED  URINALYSIS, ROUTINE W REFLEX  MICROSCOPIC    EKG EKG Interpretation  Date/Time:  Monday May 10 2021 08:02:25 EST Ventricular Rate:  60 PR Interval:  162 QRS Duration: 102 QT Interval:  424 QTC Calculation: 424 R Axis:   -30 Text Interpretation: Sinus rhythm Left axis deviation Probable anterior infarct, age indeterminate TECHNICALLY DIFFICULT background noise Otherwise no significant change Confirmed by Deno Etienne [phone]) on 05/10/2021 8:53:13 AM  Radiology CT HEAD WO CONTRAST  Result Date: 05/10/2021 CLINICAL DATA:  Focal neuro deficit greater than 6 hours. Stroke suspected. Blurred vision in right eye since yesterday. Dizziness and generalized weakness. History of meningioma at the craniocervical junction. The EXAM: CT HEAD WITHOUT CONTRAST TECHNIQUE: Contiguous axial images were obtained from the base of the skull through the vertex without intravenous contrast. COMPARISON:  CT head 10/05/2019.  MRI brain 12/10/2020. FINDINGS: Brain: The known large posterior fossa mass extending into the foramen magnum appears slightly larger compared with 2021 CT, measuring 2.9 x 2.0 cm transverse on image 4/2 and 2.8 cm on coronal image 21/4. The appearance is similar to the more recent MRI of 5 months ago. There is significant chronic mass effect on the brainstem which is displaced posteriorly to the left. There is no evidence of acute intracranial hemorrhage, new mass lesion, brain edema or extra-axial fluid collection. The ventricles and subarachnoid spaces are appropriately sized for age. There is no CT evidence of acute cortical infarction. There are stable chronic small vessel ischemic changes in the periventricular white matter. Vascular: Intracranial vascular calcifications. No hyperdense vessel identified. Skull: Negative for fracture or focal lesion. Sinuses/Orbits: The visualized paranasal sinuses and mastoid air cells are clear. No orbital abnormalities are seen. Other: Stable postsurgical changes within both globes.  IMPRESSION: 1. No evidence of acute intracranial hemorrhage or acute stroke. 2. Known large mass in the posterior fossa with extension into the foramen magnum appears slightly larger from CT 10/05/2019, although not grossly changed from the more recent MRI of 5 months ago. This exerts significant mass effect on the brainstem and may be symptomatic. No associated brain edema or increased ventriculomegaly. 3. Grossly stable chronic small vessel ischemic changes. Electronically Signed   By: Richardean Sale M.D.   On: 05/10/2021 09:08    Procedures Procedures   Medications Ordered in ED Medications - No data to display  ED Course  I have reviewed the triage vital signs and the nursing notes.  Pertinent labs & imaging results that were available during my care of the patient were reviewed by me and considered in my medical decision making (see chart for details).    MDM Rules/Calculators/A&P                           87 yo F with a chief complaints of dizziness double vision going on since yesterday afternoon.  She was too unstable to stand this morning and was brought here.  She does have some past-pointing with the right  upper extremity.  She is telling that she has trouble finding her words though I am unsure of the chronicity of that.  We will obtain a CT scan of the head blood work reassess.  CT of the head with fairly large mass has some mass-effect on the brainstem.  I discussed this with neurology, Dr. Leonel Ramsay he felt the patient needed transfer for MRI the brain with and without contrast and then likely a neurosurgical consult.  I discussed this with the ED physician at [facility], Dr. Francia Greaves accepts the patient in ED to ED transfer.  The patients results and plan were reviewed and discussed.   Any x-rays performed were independently reviewed by myself.   Differential diagnosis were considered with the presenting HPI.  Medications - No data to display  Vitals:   05/10/21 0806  05/10/21 0807 05/10/21 0819 05/10/21 0939  BP: (!) 170/80   (!) 156/81  Pulse: (!) 59   (!) 55  Resp: 14   17  Temp: 98.5 F (36.9 C)     TempSrc: Oral     SpO2: 97%   98%  Weight:  81.6 kg 72.6 kg   Height:  5\' 1"  (1.549 m)      Final diagnoses:  Diplopia  Brain mass     Final Clinical Impression(s) / ED Diagnoses Final diagnoses:  Diplopia  Brain mass    Rx / DC Orders ED Discharge Orders     None        Deno Etienne, DO 05/10/21 0940

## 2021-05-10 NOTE — Consult Note (Signed)
NEURO HOSPITALIST CONSULT NOTE   Requestig physician: Dr. Rogers Blocker  Reason for Consult: Punctate left cortical stroke seen on MRI  History obtained from:  Patient and Chart     HPI:                                                                                                                                          Jessica Mullins is an 87 y.o. female with a history of atrial fibrillation, on Eliquis, HTN, HLD, left breast cancer, posterior fossa meningioma, who presented to the hospital with chief complaint of double vision starting on Saturday. She states that she has had intermittent mild double vision in the past, but that this episode was worse. Her double vision at time of interview is resolved. She also had some intermittent dizziness, which has been going on more frequently for the past few weeks. She denies unilateral weakness, but has had several months of intermittent lower extremity weakness involving either leg but not both, with giveway that has resulted in falls. She also notices that over the past few weeks she has been searching for the right words and trying to make them come out clearly. She has no prior history of stroke.   MRI brain revealed a punctate cortically based stroke in the left parietofrontal region.   Past Medical History:  Diagnosis Date   Allergy    Ankle fracture, left    Atrial fibrillation ([place])    Per [place] [facility] Patient Packet    Breast cancer ([place]) 1115/16   left    Bursitis of right shoulder    Cataract    Family history of cancer    Fracture of right wrist    GERD (gastroesophageal reflux disease)    Hyperlipidemia    Hypertension    Lumbar radiculitis    Per [place] New Patient Packet    Meningioma [facility])    Per [place] New Patient Packet    Skin cancer 2000   melanoma and basal cell   Stress fracture    Right Heel, Per [place] New Patient Packet    Vaso vagal episode    during preparation for colonoscopy    Past Surgical  History:  Procedure Laterality Date   ABDOMINAL HYSTERECTOMY  1988   TAH/BSO--FIBROIDS   APPENDECTOMY     BREAST LUMPECTOMY     B/L--[facility]   BREAST LUMPECTOMY WITH RADIOACTIVE SEED AND SENTINEL LYMPH NODE BIOPSY Left 06/11/2015   Procedure: LEFT BREAST LUMPECTOMY WITH RADIOACTIVE SEED AND LEFT SENTINEL LYMPH NODE MAPPING;  Surgeon: Erroll Luna, MD;  Location: [facility];  Service: General;  Laterality: Left;   CATARACT EXTRACTION  2015   COLONOSCOPY  2010   DG  BONE DENSITY ([place] [facility])     EYE SURGERY Bilateral  cataracts   fiberadenoma Bilateral 1978, 1980   GANGLION CYST EXCISION     L  hand   Lipiflow procedure      Family History  Problem Relation Age of Onset   Cancer Mother        mets to bone--? primary   Coronary artery disease Brother        died of M! @ 51   Diabetes Brother    Hyperlipidemia Brother    Hypertension Brother    Heart disease Brother        chf   Heart disease Father 38       MI   Lung cancer Sister 51       former smoker   Diabetes Maternal Aunt    Lung cancer Other    Thrombosis Other        thromboembolism clotting disorder--? father died of ? clot    Hypertension Son    High Cholesterol Son             Social History:  reports that she has never smoked. She has never used smokeless tobacco. She reports current alcohol use. She reports that she does not use drugs.  Allergies  Allergen Reactions   Sulfa Antibiotics Other (See Comments)    As a child, rigid as a stick and not responsive   Tape Other (See Comments)    Blisters, Please use "paper" tape only for short periods of time   Glucosamine Forte [Nutritional Supplements] Rash   Latex Rash    MEDICATIONS:                                                                                                                     Prior to Admission:  Medications Prior to Admission  Medication Sig Dispense Refill Last Dose   Biotin 5 MG CAPS Take 5 mg by mouth daily.    05/08/2021   Calcium  Carbonate-Vitamin D 600-400 MG-UNIT tablet Take 1 tablet by mouth daily.   05/08/2021   Cholecalciferol (VITAMIN D3) 1000 UNITS CAPS Take 1,000 Units by mouth daily.   05/08/2021   cycloSPORINE (RESTASIS) 0.05 % ophthalmic emulsion Place 1 drop into both eyes 2 (two) times daily.   05/09/2021   diltiazem (CARDIZEM) 30 MG tablet TAKE 1 TABLET BY MOUTH TWICE DAILY. (Patient taking differently: Take 30 mg by mouth 2 (two) times daily.) 60 tablet 4 05/10/2021   diphenhydramine-acetaminophen (TYLENOL PM) 25-500 MG TABS tablet Take 2 tablets by mouth at bedtime as needed (pain).   05/09/2021   ELIQUIS 5 MG TABS tablet TAKE 1 TABLET BY MOUTH TWICE DAILY. (Patient taking differently: Take 5 mg by mouth 2 (two) times daily.) 60 tablet 2 05/10/2021 at 8 am   flecainide (TAMBOCOR) 50 MG tablet Take 1 tablet (50 mg total) by mouth 2 (two) times daily. 60 tablet 10 05/10/2021   ketoconazole (NIZORAL) 2 % cream APPLY TO AFFECTED AREA ONCE DAILY AS NEEDED FOR ITCHING. (Patient taking differently: Apply 1 application  topically daily as needed for irritation.) 15 g 1 05/07/2021   Multiple Vitamins-Minerals (PRESERVISION AREDS) TABS Take 2 tablets by mouth daily.   05/09/2021   Omega-3 Fatty Acids (FISH OIL PO) Take 1 tablet by mouth daily.    05/09/2021   polyethylene glycol (MIRALAX / GLYCOLAX) 17 g packet Take 17 g by mouth daily.   05/09/2021   pravastatin (PRAVACHOL) 40 MG tablet TAKE 1 TABLET ONCE DAILY. (Patient taking differently: Take 40 mg by mouth daily.) 90 tablet 1 05/09/2021   Psyllium (METAMUCIL PO) Take 1 Dose by mouth daily. 1 teaspoon   05/08/2021   Turmeric 500 MG CAPS Take 500 capsules by mouth daily.   05/08/2021   vitamin B-12 (CYANOCOBALAMIN) 1000 MCG tablet Take 1 tablet (1,000 mcg total) by mouth daily.   05/08/2021   AMBULATORY NON FORMULARY MEDICATION Walker use daily as directed.  Disp 1 Leg pain M79.604 1 each 0    COVID-19 mRNA Vac-TriS, Pfizer, (PFIZER-BIONT COVID-19 VAC-TRIS) SUSP  injection Inject into the muscle. 0.3 mL 0    methocarbamol (ROBAXIN) 500 MG tablet Take 1 tablet (500 mg total) by mouth 3 (three) times daily as needed (muscle spasm/pain). (Patient not taking: No sig reported) 20 tablet 0 Not Taking   Scheduled:   stroke: mapping our early stages of recovery book   Does not apply Once   apixaban  5 mg Oral BID   cycloSPORINE  1 drop Both Eyes BID   flecainide  50 mg Oral BID   [START ON 05/11/2021] pravastatin  40 mg Oral Daily   ROS:                                                                                                                                       Complains of chronic right shoulder pain. No headaches, CP, fever/chills, SOB, cough, rash or leg swelling. Other ROS as per HPI.    Blood pressure (!) 148/93, pulse 71, temperature 98.6 F (37 C), temperature source Oral, resp. rate 20, height 5\' 1"  (1.549 m), weight 72.6 kg, SpO2 97 %.   General Examination:                                                                                                       Physical Exam  HEENT-  [place]/AT    Lungs- Respirations unlabored Extremities- No edema  Neurological Examination Mental Status: Awake and alert. Speech fluent with intact comprehension and naming. No dysarthria. Fully oriented. Cranial Nerves:  II: Temporal visual fields intact with no extinction to DSS. PERRL.  III,IV, VI: EOMI. No ptosis. No nystagmus. No double vision during exam but did complain of difficulty maintaining far rightward gaze.  V: Temp sensation intact bilaterally.  VII: Smile symmetric VIII: Hearing intact to conversation IX,X: No hypophonia or hoarseness XI: Head is midline XII: Midline tongue extension Motor: Right : Upper extremity   5/5    Left:     Upper extremity   5/5  Lower extremity   5/5     Lower extremity   5/5 No pronator drift Sensory: Temp and light touch intact throughout, bilaterally.No extinction to DSS.  Deep Tendon Reflexes: 2+ and  symmetric brachioradialis and patellae Cerebellar: No ataxia with FNF bilaterally Gait: Deferred   Lab Results: Basic Metabolic Panel: Recent Labs  Lab 05/10/21 0824  NA 140  K 3.9  CL 104  CO2 28  GLUCOSE 92  BUN 13  CREATININE 0.76  CALCIUM 9.8    CBC: Recent Labs  Lab 05/10/21 0824  WBC 7.7  NEUTROABS 5.4  HGB 15.3*  HCT 45.4  MCV 92.1  PLT 193    Cardiac Enzymes: No results for input(s): CKTOTAL, CKMB, CKMBINDEX, TROPONINI in the last 168 hours.  Lipid Panel: No results for input(s): CHOL, TRIG, HDL, CHOLHDL, VLDL, LDLCALC in the last 168 hours.  Imaging: CT HEAD WO CONTRAST  Result Date: 05/10/2021 CLINICAL DATA:  Focal neuro deficit greater than 6 hours. Stroke suspected. Blurred vision in right eye since yesterday. Dizziness and generalized weakness. History of meningioma at the craniocervical junction. The EXAM: CT HEAD WITHOUT CONTRAST TECHNIQUE: Contiguous axial images were obtained from the base of the skull through the vertex without intravenous contrast. COMPARISON:  CT head 10/05/2019.  MRI brain 12/10/2020. FINDINGS: Brain: The known large posterior fossa mass extending into the foramen magnum appears slightly larger compared with 2021 CT, measuring 2.9 x 2.0 cm transverse on image 4/2 and 2.8 cm on coronal image 21/4. The appearance is similar to the more recent MRI of 5 months ago. There is significant chronic mass effect on the brainstem which is displaced posteriorly to the left. There is no evidence of acute intracranial hemorrhage, new mass lesion, brain edema or extra-axial fluid collection. The ventricles and subarachnoid spaces are appropriately sized for age. There is no CT evidence of acute cortical infarction. There are stable chronic small vessel ischemic changes in the periventricular white matter. Vascular: Intracranial vascular calcifications. No hyperdense vessel identified. Skull: Negative for fracture or focal lesion. Sinuses/Orbits: The  visualized paranasal sinuses and mastoid air cells are clear. No orbital abnormalities are seen. Other: Stable postsurgical changes within both globes. IMPRESSION: 1. No evidence of acute intracranial hemorrhage or acute stroke. 2. Known large mass in the posterior fossa with extension into the foramen magnum appears slightly larger from CT 10/05/2019, although not grossly changed from the more recent MRI of 5 months ago. This exerts significant mass effect on the brainstem and may be symptomatic. No associated brain edema or increased ventriculomegaly. 3. Grossly stable chronic small vessel ischemic changes. Electronically Signed   By: Richardean Sale M.D.   On: 05/10/2021 09:08   MR Brain W and Wo Contrast  Result Date: 05/10/2021 CLINICAL DATA:  Brain mass or lesion, focal neuro deficit EXAM: MRI HEAD WITHOUT AND WITH CONTRAST TECHNIQUE: Multiplanar, multiecho pulse sequences of the brain and surrounding structures were obtained without and with intravenous contrast. CONTRAST:  82mL GADAVIST GADOBUTROL 1 MMOL/ML IV SOLN COMPARISON:  12/10/2020 FINDINGS: Brain: Punctate area of restricted diffusion in the anterior left parietal lobe cortex (series 3, image 39-40 and series 4, image 13), with ADC correlate and without definite associated increased T2 signal. Redemonstrated enhancing mass in the lower right posterior fossa, most likely a meningioma, which compresses lower brainstem and displaces the vertebral arteries. The mass measures approximately 2.9 X 2.1 x 2.5 cm (series 11, image 11 and series 13, image 12), previously 2.8 x 2.1 x 2.6 cm when remeasured similarly, overall unchanged. The mass continues to overlie the right hypoglossal canal, with regional dural thickening along the posterior clivus. There is mass effect on the lower fourth ventricle, but unchanged size and configuration the superior aspect of the fourth ventricle no definite hydrocephalus. No new enhancing mass. No cerebral mass effect or  midline shift. No extra-axial collection. T2 hyperintense signal in the periventricular white matter, likely the sequela of chronic small vessel ischemic disease. Vascular: Normal flow voids. Skull and upper cervical spine: Normal marrow signal. Sinuses/Orbits: Negative.  Status post bilateral lens replacements. Other: The mastoids are well aerated. IMPRESSION: 1. Punctate area of restricted diffusion in the anterior left parietal lobe cortex, consistent with acute infarct. 2. Overall unchanged posterior fossa mass, most likely a meningioma. These results were called by telephone at the time of interpretation on 05/10/2021 at 1:59 pm to provider Dr. Roslynn Amble , who verbally acknowledged these results. Electronically Signed   By: Merilyn Baba M.D.   On: 05/10/2021 14:02   CT ANGIO HEAD NECK W WO CM (CODE STROKE)  Result Date: 05/10/2021 CLINICAL DATA:  Initial evaluation for acute stroke. EXAM: CT ANGIOGRAPHY HEAD AND NECK TECHNIQUE: Multidetector CT imaging of the head and neck was performed using the standard protocol during bolus administration of intravenous contrast. Multiplanar CT image reconstructions and MIPs were obtained to evaluate the vascular anatomy. Carotid stenosis measurements (when applicable) are obtained utilizing NASCET criteria, using the distal internal carotid diameter as the denominator. CONTRAST:  24mL OMNIPAQUE IOHEXOL 350 MG/ML SOLN COMPARISON:  Prior CT and MRI from earlier the same day. FINDINGS: CTA NECK FINDINGS Aortic arch: Visualized aortic arch normal in caliber with normal branch pattern. Mild atheromatous change about the arch and origin of the great vessels without stenosis. Right carotid system: Right common and internal carotid arteries widely patent without stenosis, dissection or occlusion. Mild for age plaque about the right carotid bulb without stenosis. Left carotid system: Left common and internal carotid arteries widely patent without stenosis, dissection or  occlusion. Mild for age plaque about the left carotid bulb without stenosis. Vertebral arteries: Both vertebral arteries arise from the subclavian arteries. No proximal subclavian artery stenosis. Both vertebral arteries widely patent without stenosis, dissection or occlusion. Skeleton: No worrisome lytic or blastic osseous lesions. Mild for age multilevel cervical spondylosis without significant spinal stenosis. Other neck: No other acute soft tissue abnormality within the neck. Upper chest: 6 mm nodule present at the left lung apex (series 6, image 129), indeterminate. Visualized upper chest demonstrates no other acute finding. Review of the MIP images confirms the above findings CTA HEAD FINDINGS Anterior circulation: Both internal carotid arteries patent to the termini without stenosis. Mild for age atheromatous change within the carotid siphons without significant stenosis. A1 segments patent bilaterally. Normal anterior communicating artery complex. Anterior cerebral arteries patent without stenosis. No M1 stenosis or occlusion. Normal MCA bifurcations. Distal MCA branches well perfused and symmetric. Posterior circulation: Dominant left vertebral artery widely patent without stenosis. Diminutive right vertebral artery displaced around  the right posterior fossa mass, but remains widely patent. Both PICA origins patent and normal. Basilar patent to its distal aspect without stenosis. Superior cerebral arteries patent bilaterally. Both PCAs primarily supplied via the basilar well perfused or distal aspects. Venous sinuses: Not well assessed due to timing the contrast bolus. Anatomic variants: None significant.  No aneurysm. Review of the MIP images confirms the above findings IMPRESSION: 1. Negative CTA for large vessel occlusion. 2. Mild for age atheromatous change about the carotid bifurcations and carotid siphons without hemodynamically significant or correctable stenosis. 3. Mass effect on the right V4  segment by the adjacent right posterior fossa mass without stenosis. 4. 6 mm left upper lobe nodule, indeterminate. Non-contrast chest CT at 6-12 months is recommended. If the nodule is stable at time of repeat CT, then future CT at 18-24 months (from today's scan) is considered optional for low-risk patients, but is recommended for high-risk patients. This recommendation follows the consensus statement: Guidelines for Management of Incidental Pulmonary Nodules Detected on CT Images: From the Fleischner Society 2017; Radiology 2017; 284:228-243. Electronically Signed   By: Jeannine Boga M.D.   On: 05/10/2021 22:46     Assessment: 87 year old female with atrial fibrillation, on Eliquis, who presented with a recurrent episode of double vision, worse than prior spells, in addition to intermittent dizziness and difficulty with word finding for several weeks. MRI revealed a punctate cortically based stroke in the left parietal lobe.  1. Exam is nonfocal 2. MRI brain: Punctate area of restricted diffusion in the anterior left parietal lobe cortex, consistent with acute infarct. Overall unchanged posterior fossa mass, most likely a meningioma. 3. CTA of head and neck:  Negative CTA for large vessel occlusion. Mild for age atheromatous change about the carotid bifurcations and carotid siphons without hemodynamically significant or correctable stenosis. Mass effect on the right V4 segment by the adjacent right posterior fossa mass without stenosis. 4. Echocardiogram completed with report pending.  5. Stroke risk factors: Atrial fibrillation, HTN, HLD and history of cancer.   Recommendations: 1. HgbA1c, fasting lipid panel 2. PT consult, OT consult, Speech consult 3. Continue Eliquis 4. Risk factor modification 5. Telemetry monitoring 6. Frequent neuro checks 7. BP management. Out of permissive HTN time window.    Electronically signed: Dr. Kerney Elbe 05/10/2021, 11:04 PM

## 2021-05-10 NOTE — ED Notes (Signed)
MRI called and aware pt is in [place]. MRI to come get pt shortly.

## 2021-05-10 NOTE — ED Triage Notes (Signed)
Pt from [facility] started experiencing blurry vision in right eye yesterday afternoon. Pt is also.experiencing dizziness and generalized weakness in her legs. Pt states she is having difficulty forming words and speaking. Pt states that she has had to "search for words" the past few weeks.

## 2021-05-11 ENCOUNTER — Observation Stay (HOSPITAL_BASED_OUTPATIENT_CLINIC_OR_DEPARTMENT_OTHER): Payer: Medicare PPO

## 2021-05-11 DIAGNOSIS — E7800 Pure hypercholesterolemia, unspecified: Secondary | ICD-10-CM | POA: Diagnosis not present

## 2021-05-11 DIAGNOSIS — I6389 Other cerebral infarction: Secondary | ICD-10-CM

## 2021-05-11 DIAGNOSIS — I10 Essential (primary) hypertension: Secondary | ICD-10-CM

## 2021-05-11 DIAGNOSIS — R42 Dizziness and giddiness: Secondary | ICD-10-CM | POA: Diagnosis not present

## 2021-05-11 DIAGNOSIS — I639 Cerebral infarction, unspecified: Secondary | ICD-10-CM | POA: Diagnosis not present

## 2021-05-11 DIAGNOSIS — H532 Diplopia: Secondary | ICD-10-CM

## 2021-05-11 DIAGNOSIS — I480 Paroxysmal atrial fibrillation: Secondary | ICD-10-CM | POA: Diagnosis not present

## 2021-05-11 DIAGNOSIS — D329 Benign neoplasm of meninges, unspecified: Secondary | ICD-10-CM

## 2021-05-11 LAB — LIPID PANEL
Cholesterol: 146 mg/dL (ref 0–200)
HDL: 59 mg/dL (ref >40)
LDL Cholesterol: 70 mg/dL (ref 0–99)
Total CHOL/HDL Ratio: 2.5 RATIO
Triglycerides: 86 mg/dL (ref <150)
VLDL: 17 mg/dL (ref 0–40)

## 2021-05-11 LAB — ECHOCARDIOGRAM COMPLETE
Area-P 1/2: 1.82 cm2
Height: 61 in
S' Lateral: 2.4 cm
Weight: 2560 oz

## 2021-05-11 LAB — HEMOGLOBIN A1C
Hgb A1c MFr Bld: 4.8 % (ref 4.8–5.6)
Mean Plasma Glucose: 91.06 mg/dL

## 2021-05-11 MED ORDER — PROSIGHT PO TABS
1.0000 | ORAL_TABLET | Freq: Every day | ORAL | Status: DC
  Administered 2021-05-11: 1 via ORAL
  Filled 2021-05-11: qty 1

## 2021-05-11 MED ORDER — POLYETHYLENE GLYCOL 3350 17 G PO PACK
17.0000 g | PACK | Freq: Every day | ORAL | Status: DC
  Filled 2021-05-11: qty 1

## 2021-05-11 MED ORDER — OYSTER SHELL CALCIUM/D3 500-5 MG-MCG PO TABS
1.0000 | ORAL_TABLET | Freq: Every day | ORAL | Status: DC
Start: 2021-05-12 — End: 2021-05-12

## 2021-05-11 MED ORDER — CALCIUM CARBONATE-VITAMIN D 600-400 MG-UNIT PO TABS: 1.0000 | ORAL_TABLET | Freq: Every day | ORAL | Status: DC

## 2021-05-11 MED ORDER — BIOTIN 5 MG PO CAPS: 5.0000 mg | ORAL_CAPSULE | Freq: Every day | ORAL | Status: DC

## 2021-05-11 MED ORDER — ASPIRIN 81 MG PO CHEW
81.0000 mg | CHEWABLE_TABLET | Freq: Every day | ORAL | Status: DC
  Administered 2021-05-11: 81 mg via ORAL
  Filled 2021-05-11: qty 1

## 2021-05-11 MED ORDER — VITAMIN D3 25 MCG (1000 UNIT) PO TABS
1000.0000 [IU] | ORAL_TABLET | Freq: Every day | ORAL | Status: DC
  Administered 2021-05-11: 1000 [IU] via ORAL
  Filled 2021-05-11 (×2): qty 1

## 2021-05-11 MED ORDER — PRESERVISION AREDS PO TABS: 2.0000 | ORAL_TABLET | Freq: Every day | ORAL | Status: DC

## 2021-05-11 MED ORDER — VITAMIN B-12 1000 MCG PO TABS
1000.0000 ug | ORAL_TABLET | Freq: Every day | ORAL | Status: DC
  Administered 2021-05-11: 1000 ug via ORAL
  Filled 2021-05-11: qty 1

## 2021-05-11 MED ORDER — PANTOPRAZOLE SODIUM 40 MG PO TBEC
40.0000 mg | DELAYED_RELEASE_TABLET | Freq: Every day | ORAL | Status: DC
  Administered 2021-05-11: 40 mg via ORAL
  Filled 2021-05-11: qty 1

## 2021-05-11 NOTE — Evaluation (Signed)
Physical Therapy Evaluation Patient Details Name: [name] MRN: [id number] DOB: 08/06/1930 Today's Date: 05/11/2021  History of Present Illness  Jessica Mullins is a 87 y.o. 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 Pt with medical history significant of hx of left breat cancer, atrial fibrillation on eliquis, HTN.  Clinical Impression  Pt admitted with above diagnosis. Pt currently with functional limitations due to the deficits listed below (see PT Problem List). Pt reports improvement in vision since OT occluded her L eye, and states her vision is "perfect" while ambulating in the hallway. Pt was able to complete head turns (R/L and Up/Down) without significant gait deviations or reports of visual changes with focusing on targets. Feel this patient will benefit from outpatient PT for higher level balance activity. Initially, may benefit from increased supervision at her apartment. Acutely, pt will benefit from skilled PT to increase their independence and safety with mobility to allow discharge to the venue listed below.          Recommendations for follow up therapy are one component of a multi-disciplinary discharge planning process, led by the attending physician.  Recommendations may be updated based on patient status, additional functional criteria and insurance authorization.  Follow Up Recommendations Outpatient PT    Assistance Recommended at Discharge Intermittent Supervision/Assistance  Functional Status Assessment Patient has had a recent decline in their functional status and demonstrates the ability to make significant improvements in function in a reasonable and predictable amount of time.  Equipment Recommendations  None recommended by PT    Recommendations for Other Services       Precautions / Restrictions Precautions Precautions: Fall Restrictions Weight Bearing Restrictions: No       Mobility  Bed Mobility Overal bed mobility: Modified Independent             General bed mobility comments: Pt was received sitting up in the recliner.    Transfers Overall transfer level: Needs assistance Equipment used: Rolling walker (2 wheels) Transfers: Sit to/from Stand Sit to Stand: Min guard           General transfer comment: Close guard for safety as pt powered up to full stand. Increased time to sit however with controlled lower.    Ambulation/Gait Ambulation/Gait assistance: Min guard Gait Distance (Feet): 200 Feet Assistive device: Rolling walker (2 wheels) Gait Pattern/deviations: Step-through pattern;Decreased stride length;Trunk flexed Gait velocity: Decreased Gait velocity interpretation: <1.31 ft/sec, indicative of household ambulator   General Gait Details: VC's for improved posture, closer walker proximity, and forward gaze. No assist required however mildly unsteady at times.  Stairs            Wheelchair Mobility    Modified Rankin (Stroke Patients Only) Modified Rankin (Stroke Patients Only) Pre-Morbid Rankin Score: No significant disability Modified Rankin: Moderately severe disability     Balance Overall balance assessment: Needs assistance Sitting-balance support: Feet supported Sitting balance-Leahy Scale: Good     Standing balance support: Single extremity supported;During functional activity Standing balance-Leahy Scale: Fair                               Pertinent Vitals/Pain Pain Assessment: No/denies pain    Home Living Family/patient expects to be discharged to:: Private residence Living Arrangements: Alone Available Help at Discharge: Family;Available PRN/intermittently Type of Home: Independent living facility Home Access: Level entry  Home Layout: One level Home Equipment: Rollator (4 wheels);Rolling Walker (2 wheels);BSC/3in1 Additional Comments: [facility]F at [facility]    Prior Function  Prior Level of Function : Independent/Modified Independent             Mobility Comments: uses rollator or RW ADLs Comments: indep; walks to cafeteria/resturants at [employer] for meals     Hand Dominance   Dominant Hand: Right    Extremity/Trunk Assessment   Upper Extremity Assessment Upper Extremity Assessment: RUE deficits/detail RUE Deficits / Details: Slightly weaker than L with shoulder flexion, biceps/triceps    Lower Extremity Assessment Lower Extremity Assessment: RLE deficits/detail RLE Deficits / Details: quads/hip flexors 4-/5; hamstrings and dorsiflexion 5/5    Cervical / Trunk Assessment Cervical / Trunk Assessment: Kyphotic (mild)  Communication   Communication: No difficulties  Cognition Arousal/Alertness: Awake/alert Behavior During Therapy: WFL for tasks assessed/performed Overall Cognitive Status: Within Functional Limits for tasks assessed                                 General Comments: difficulty verbalizing vision changes/symptoms        General Comments General comments (skin integrity, edema, etc.): VSS on RA    Exercises     Assessment/Plan    PT Assessment Patient needs continued PT services  PT Problem List         PT Treatment Interventions DME instruction;Gait training;Functional mobility training;Therapeutic activities;Therapeutic exercise;Neuromuscular re-education;Patient/family education    PT Goals (Current goals can be found in the Care Plan section)  Acute Rehab PT Goals Patient Stated Goal: Stop having vision issues PT Goal Formulation: With patient Time For Goal Achievement: 05/18/21 Potential to Achieve Goals: Good    Frequency Min 4X/week   Barriers to discharge Inaccessible home environment      Co-evaluation               AM-PAC PT "6 Clicks" Mobility  Outcome Measure Help needed turning from your back to your side while in a flat bed without using bedrails?: None Help needed  moving from lying on your back to sitting on the side of a flat bed without using bedrails?: None Help needed moving to and from a bed to a chair (including a wheelchair)?: A Little Help needed standing up from a chair using your arms (e.g., wheelchair or bedside chair)?: A Little Help needed to walk in hospital room?: A Little Help needed climbing 3-5 steps with a railing? : A Little 6 Click Score: 20    End of Session Equipment Utilized During Treatment: Gait belt Activity Tolerance: Patient tolerated treatment well Patient left: in chair;with call bell/phone within reach;with chair alarm set Nurse Communication: Mobility status PT Visit Diagnosis: Unsteadiness on feet (R26.81);Other symptoms and signs involving the nervous system (R29.898)    Time:  -      Charges:   PT Evaluation $PT Eval Moderate Complexity: 1 Mod          Rolinda Roan, PT, DPT Acute Rehabilitation Services Pager: [phone] Office: [phone]   Thelma Comp 05/11/2021, 2:01 PM

## 2021-05-11 NOTE — Discharge Instructions (Addendum)
If you have diplopia cover the affected eye and seek medical attention right away.  Please do not drive if you have diplopia.    Follow with Primary MD Virgie Dad, MD and your ophthalmologist in 7 days   Get CBC, CMP, 2 view Chest X ray -  checked next visit within 1 week by Primary MD   Activity: As tolerated with Full fall precautions use walker/cane & assistance as needed  Disposition Home    Diet: Heart Healthy    Special Instructions: If you have smoked or chewed Tobacco  in the last 2 yrs please stop smoking, stop any regular Alcohol  and or any Recreational drug use.  On your next visit with your primary care physician please Get Medicines reviewed and adjusted.  Please request your Prim.MD to go over all Hospital Tests and Procedure/Radiological results at the follow up, please get all Hospital records sent to your Prim MD by signing hospital release before you go home.  If you experience worsening of your admission symptoms, develop shortness of breath, life threatening emergency, suicidal or homicidal thoughts you must seek medical attention immediately by calling 911 or calling your MD immediately  if symptoms less severe.  You Must read complete instructions/literature along with all the possible adverse reactions/side effects for all the Medicines you take and that have been prescribed to you. Take any new Medicines after you have completely understood and accpet all the possible adverse reactions/side effects.

## 2021-05-11 NOTE — Care Management Obs Status (Signed)
[facility] NOTIFICATION   Patient Details  Name: Jessica Mullins MRN: [id number] Date of Birth: 07/23/1930   Medicare Observation Status Notification Given:  Yes    Pollie Friar, RN 05/11/2021, 3:32 PM

## 2021-05-11 NOTE — Plan of Care (Signed)
  Problem: Education: Goal: Knowledge of General Education information will improve Description: Including pain rating scale, medication(s)/side effects and non-pharmacologic comfort measures Outcome: Progressing   Problem: Health Behavior/Discharge Planning: Goal: Ability to manage health-related needs will improve Outcome: Progressing   Problem: Clinical Measurements: Goal: Ability to maintain clinical measurements within normal limits will improve Outcome: Progressing Goal: Will remain free from infection Outcome: Progressing Goal: Diagnostic test results will improve Outcome: Progressing Goal: Respiratory complications will improve Outcome: Progressing Goal: Cardiovascular complication will be avoided Outcome: Progressing   Problem: Activity: Goal: Risk for activity intolerance will decrease Outcome: Progressing   Problem: Nutrition: Goal: Adequate nutrition will be maintained Outcome: Progressing   Problem: Coping: Goal: Level of anxiety will decrease Outcome: Progressing   Problem: Elimination: Goal: Will not experience complications related to bowel motility Outcome: Progressing Goal: Will not experience complications related to urinary retention Outcome: Progressing   Problem: Pain Managment: Goal: General experience of comfort will improve Outcome: Progressing   Problem: Safety: Goal: Ability to remain free from injury will improve Outcome: Progressing   Problem: Skin Integrity: Goal: Risk for impaired skin integrity will decrease Outcome: Progressing   Problem: Education: Goal: Knowledge of disease or condition will improve Outcome: Progressing Goal: Knowledge of secondary prevention will improve (SELECT ALL) Outcome: Progressing Goal: Knowledge of patient specific risk factors will improve (INDIVIDUALIZE FOR PATIENT) Outcome: Progressing   Problem: Education: Goal: Knowledge of General Education information will improve Description: Including  pain rating scale, medication(s)/side effects and non-pharmacologic comfort measures Outcome: Progressing   Problem: Health Behavior/Discharge Planning: Goal: Ability to manage health-related needs will improve Outcome: Progressing   Problem: Clinical Measurements: Goal: Ability to maintain clinical measurements within normal limits will improve Outcome: Progressing Goal: Will remain free from infection Outcome: Progressing Goal: Diagnostic test results will improve Outcome: Progressing Goal: Respiratory complications will improve Outcome: Progressing Goal: Cardiovascular complication will be avoided Outcome: Progressing   Problem: Activity: Goal: Risk for activity intolerance will decrease Outcome: Progressing   Problem: Nutrition: Goal: Adequate nutrition will be maintained Outcome: Progressing   Problem: Coping: Goal: Level of anxiety will decrease Outcome: Progressing   Problem: Elimination: Goal: Will not experience complications related to bowel motility Outcome: Progressing Goal: Will not experience complications related to urinary retention Outcome: Progressing   Problem: Pain Managment: Goal: General experience of comfort will improve Outcome: Progressing   Problem: Safety: Goal: Ability to remain free from injury will improve Outcome: Progressing   Problem: Skin Integrity: Goal: Risk for impaired skin integrity will decrease Outcome: Progressing   Problem: Education: Goal: Knowledge of disease or condition will improve Outcome: Progressing Goal: Knowledge of secondary prevention will improve (SELECT ALL) Outcome: Progressing Goal: Knowledge of patient specific risk factors will improve (INDIVIDUALIZE FOR PATIENT) Outcome: Progressing

## 2021-05-11 NOTE — Progress Notes (Signed)
PROGRESS NOTE                                                                                                                                                                                                             Patient Demographics:    Jessica Mullins, is a 87 y.o. female, DOB - 16-Sep-1930, OMV:[id number]  Outpatient Primary MD for the patient is Virgie Dad, MD    LOS - 0  Admit date - 05/10/2021    Chief Complaint  Patient presents with   Dizziness       Brief Narrative (HPI from H&P)    Jessica Mullins is a 87 y.o. female with medical history significant of hx of left breat cancer, atrial fibrillation on eliquis, HLD, HTN who presented to [facility] with vision changes.  MRI suggestive of acute stroke.   Subjective:    Jessica Mullins today has, No headache, No chest pain, No abdominal pain - No Nausea, No new weakness tingling or numbness, no shortness of breath, still having some right visual field defects   Assessment  & Plan :     Acute on chronic visual field defects mostly right temporal.  She has chronic vision issues in her left eye since childhood.  MRI confirms left parietal lobe cortex acute infarct, CTA noted, case discussed with neurology.  Continue Eliquis along with statin and aspirin for now, LDL and A1c are stable, complete stroke work-up.  Stroke team will see her shortly.   2.  Meningioma.  Outpatient follow-up with neurosurgery.  3.  Left upper lobe lung nodule.  Follow-up with pulmonary within a month for repeat CT soon thereafter.  4.  Paroxysmal atrial fibrillation [place] vas 2 score of greater than 3.  Continue Eliquis for anticoagulation, as needed IV Cardizem for now oral Cardizem held for permissive hypertension.  Continue flecainide.  5.  Dyslipidemia.  Continue home dose statin.  LDL at goal.  6.  Vitamin B12 deficiency.  Continue on oral replacement outpatient follow-up with PCP.       Condition - Fair  Family Communication  :  daughter over the phone on 05/11/2021  Code Status :  Full  Consults  :  Neuro  PUD Prophylaxis : PPI   Procedures  :            Disposition Plan  :  Status is: Observation   DVT Prophylaxis  :     apixaban (ELIQUIS) tablet 5 mg     Lab Results  Component Value Date   PLT 193 05/10/2021    Diet :  Diet Order             Diet Heart Room service appropriate? Yes; Fluid consistency: Thin  Diet effective now                    Inpatient Medications  Scheduled Meds:   stroke: mapping our early stages of recovery book   Does not apply Once   apixaban  5 mg Oral BID   cycloSPORINE  1 drop Both Eyes BID   flecainide  50 mg Oral BID   pravastatin  40 mg Oral Daily   Continuous Infusions: PRN Meds:.acetaminophen **OR** acetaminophen (TYLENOL) oral liquid 160 mg/5 mL **OR** acetaminophen, senna-docusate  Antibiotics  :    Anti-infectives (From admission, onward)    None        Time Spent in minutes  30   Lala Lund M.D on 05/11/2021 at 10:37 AM  To page go to [email or web address]   [facility] -  Office  [phone]  See all Orders from today for further details    Objective:   Vitals:   05/10/21 2316 05/11/21 0418 05/11/21 0430 05/11/21 0806  BP: (!) 149/66 (!) 138/107 132/90 (!) 145/73  Pulse: 60 (!) 59 69 (!) 58  Resp: 19 18  16   Temp: 98.4 F (36.9 C) 98.2 F (36.8 C)  97.7 F (36.5 C)  TempSrc: Oral Oral  Oral  SpO2: 99% 96%  95%  Weight:      Height:        Wt Readings from Last 3 Encounters:  05/10/21 72.6 kg  04/06/21 72.1 kg  02/17/21 71.8 kg    No intake or output data in the 24 hours ending 05/11/21 1037   Physical Exam  Awake Alert, No new F.N deficits, R.temporal Visual field defects [place].AT,PERRAL Supple Neck, No JVD,   Symmetrical Chest wall movement, Good air movement bilaterally, CTAB RRR,No Gallops,Rubs or new Murmurs,  +ve B.Sounds, Abd Soft, No  tenderness,   No Cyanosis, Clubbing or edema       Data Review:    CBC Recent Labs  Lab 05/10/21 0824  WBC 7.7  HGB 15.3*  HCT 45.4  PLT 193  MCV 92.1  MCH 31.0  MCHC 33.7  RDW 14.5  LYMPHSABS 1.7  MONOABS 0.5  EOSABS 0.1  BASOSABS 0.0    Electrolytes Recent Labs  Lab 05/10/21 0824 05/11/21 0128  NA 140  --   K 3.9  --   CL 104  --   CO2 28  --   GLUCOSE 92  --   BUN 13  --   CREATININE 0.76  --   CALCIUM 9.8  --   AST 19  --   ALT 13  --   ALKPHOS 46  --   BILITOT 0.7  --   ALBUMIN 4.3  --   INR 1.2  --   HGBA1C  --  4.8    ------------------------------------------------------------------------------------------------------------------ Recent Labs    05/11/21 0128  CHOL 146  HDL 59  LDLCALC 70  TRIG 86  CHOLHDL 2.5    Lab Results  Component Value Date   HGBA1C 4.8 05/11/2021    No results for input(s): TSH, T4TOTAL, T3FREE, THYROIDAB in the last 72 hours.  Invalid input(s): FREET3 ------------------------------------------------------------------------------------------------------------------  ID Labs Recent Labs  Lab 05/10/21 0824  WBC 7.7  PLT 193  CREATININE 0.76   Cardiac Enzymes No results for input(s): CKMB, TROPONINI, MYOGLOBIN in the last 168 hours.  Invalid input(s): CK   Radiology Reports CT HEAD WO CONTRAST  Result Date: 05/10/2021 CLINICAL DATA:  Focal neuro deficit greater than 6 hours. Stroke suspected. Blurred vision in right eye since yesterday. Dizziness and generalized weakness. History of meningioma at the craniocervical junction. The EXAM: CT HEAD WITHOUT CONTRAST TECHNIQUE: Contiguous axial images were obtained from the base of the skull through the vertex without intravenous contrast. COMPARISON:  CT head 10/05/2019.  MRI brain 12/10/2020. FINDINGS: Brain: The known large posterior fossa mass extending into the foramen magnum appears slightly larger compared with 2021 CT, measuring 2.9 x 2.0 cm transverse on  image 4/2 and 2.8 cm on coronal image 21/4. The appearance is similar to the more recent MRI of 5 months ago. There is significant chronic mass effect on the brainstem which is displaced posteriorly to the left. There is no evidence of acute intracranial hemorrhage, new mass lesion, brain edema or extra-axial fluid collection. The ventricles and subarachnoid spaces are appropriately sized for age. There is no CT evidence of acute cortical infarction. There are stable chronic small vessel ischemic changes in the periventricular white matter. Vascular: Intracranial vascular calcifications. No hyperdense vessel identified. Skull: Negative for fracture or focal lesion. Sinuses/Orbits: The visualized paranasal sinuses and mastoid air cells are clear. No orbital abnormalities are seen. Other: Stable postsurgical changes within both globes. IMPRESSION: 1. No evidence of acute intracranial hemorrhage or acute stroke. 2. Known large mass in the posterior fossa with extension into the foramen magnum appears slightly larger from CT 10/05/2019, although not grossly changed from the more recent MRI of 5 months ago. This exerts significant mass effect on the brainstem and may be symptomatic. No associated brain edema or increased ventriculomegaly. 3. Grossly stable chronic small vessel ischemic changes. Electronically Signed   By: Richardean Sale M.D.   On: 05/10/2021 09:08   MR Brain W and Wo Contrast  Result Date: 05/10/2021 CLINICAL DATA:  Brain mass or lesion, focal neuro deficit EXAM: MRI HEAD WITHOUT AND WITH CONTRAST TECHNIQUE: Multiplanar, multiecho pulse sequences of the brain and surrounding structures were obtained without and with intravenous contrast. CONTRAST:  61mL GADAVIST GADOBUTROL 1 MMOL/ML IV SOLN COMPARISON:  12/10/2020 FINDINGS: Brain: Punctate area of restricted diffusion in the anterior left parietal lobe cortex (series 3, image 39-40 and series 4, image 13), with ADC correlate and without definite  associated increased T2 signal. Redemonstrated enhancing mass in the lower right posterior fossa, most likely a meningioma, which compresses lower brainstem and displaces the vertebral arteries. The mass measures approximately 2.9 X 2.1 x 2.5 cm (series 11, image 11 and series 13, image 12), previously 2.8 x 2.1 x 2.6 cm when remeasured similarly, overall unchanged. The mass continues to overlie the right hypoglossal canal, with regional dural thickening along the posterior clivus. There is mass effect on the lower fourth ventricle, but unchanged size and configuration the superior aspect of the fourth ventricle no definite hydrocephalus. No new enhancing mass. No cerebral mass effect or midline shift. No extra-axial collection. T2 hyperintense signal in the periventricular white matter, likely the sequela of chronic small vessel ischemic disease. Vascular: Normal flow voids. Skull and upper cervical spine: Normal marrow signal. Sinuses/Orbits: Negative.  Status post bilateral lens replacements. Other: The mastoids are well aerated. IMPRESSION: 1. Punctate area of restricted  diffusion in the anterior left parietal lobe cortex, consistent with acute infarct. 2. Overall unchanged posterior fossa mass, most likely a meningioma. These results were called by telephone at the time of interpretation on 05/10/2021 at 1:59 pm to provider Dr. Roslynn Amble , who verbally acknowledged these results. Electronically Signed   By: Merilyn Baba M.D.   On: 05/10/2021 14:02   CT ANGIO HEAD NECK W WO CM (CODE STROKE)  Result Date: 05/10/2021 CLINICAL DATA:  Initial evaluation for acute stroke. EXAM: CT ANGIOGRAPHY HEAD AND NECK TECHNIQUE: Multidetector CT imaging of the head and neck was performed using the standard protocol during bolus administration of intravenous contrast. Multiplanar CT image reconstructions and MIPs were obtained to evaluate the vascular anatomy. Carotid stenosis measurements (when applicable) are obtained  utilizing NASCET criteria, using the distal internal carotid diameter as the denominator. CONTRAST:  20mL OMNIPAQUE IOHEXOL 350 MG/ML SOLN COMPARISON:  Prior CT and MRI from earlier the same day. FINDINGS: CTA NECK FINDINGS Aortic arch: Visualized aortic arch normal in caliber with normal branch pattern. Mild atheromatous change about the arch and origin of the great vessels without stenosis. Right carotid system: Right common and internal carotid arteries widely patent without stenosis, dissection or occlusion. Mild for age plaque about the right carotid bulb without stenosis. Left carotid system: Left common and internal carotid arteries widely patent without stenosis, dissection or occlusion. Mild for age plaque about the left carotid bulb without stenosis. Vertebral arteries: Both vertebral arteries arise from the subclavian arteries. No proximal subclavian artery stenosis. Both vertebral arteries widely patent without stenosis, dissection or occlusion. Skeleton: No worrisome lytic or blastic osseous lesions. Mild for age multilevel cervical spondylosis without significant spinal stenosis. Other neck: No other acute soft tissue abnormality within the neck. Upper chest: 6 mm nodule present at the left lung apex (series 6, image 129), indeterminate. Visualized upper chest demonstrates no other acute finding. Review of the MIP images confirms the above findings CTA HEAD FINDINGS Anterior circulation: Both internal carotid arteries patent to the termini without stenosis. Mild for age atheromatous change within the carotid siphons without significant stenosis. A1 segments patent bilaterally. Normal anterior communicating artery complex. Anterior cerebral arteries patent without stenosis. No M1 stenosis or occlusion. Normal MCA bifurcations. Distal MCA branches well perfused and symmetric. Posterior circulation: Dominant left vertebral artery widely patent without stenosis. Diminutive right vertebral artery displaced  around the right posterior fossa mass, but remains widely patent. Both PICA origins patent and normal. Basilar patent to its distal aspect without stenosis. Superior cerebral arteries patent bilaterally. Both PCAs primarily supplied via the basilar well perfused or distal aspects. Venous sinuses: Not well assessed due to timing the contrast bolus. Anatomic variants: None significant.  No aneurysm. Review of the MIP images confirms the above findings IMPRESSION: 1. Negative CTA for large vessel occlusion. 2. Mild for age atheromatous change about the carotid bifurcations and carotid siphons without hemodynamically significant or correctable stenosis. 3. Mass effect on the right V4 segment by the adjacent right posterior fossa mass without stenosis. 4. 6 mm left upper lobe nodule, indeterminate. Non-contrast chest CT at 6-12 months is recommended. If the nodule is stable at time of repeat CT, then future CT at 18-24 months (from today's scan) is considered optional for low-risk patients, but is recommended for high-risk patients. This recommendation follows the consensus statement: Guidelines for Management of Incidental Pulmonary Nodules Detected on CT Images: From the Fleischner Society 2017; Radiology 2017; 284:228-243. Electronically Signed   By: Jeannine Boga  M.D.   On: 05/10/2021 22:46

## 2021-05-11 NOTE — Discharge Summary (Signed)
Jessica Mullins YIR:[id number] DOB: September 02, 1930 DOA: 05/10/2021  PCP: Virgie Dad, MD  Admit date: 05/10/2021  Discharge date: 05/11/2021  Admitted From: Home   Disposition:  Home   Recommendations for Outpatient Follow-up:   Follow up with PCP in 1-2 weeks  PCP Please obtain BMP/CBC, 2 view CXR in 1week,  (see Discharge instructions)   PCP Please follow up on the following pending results: Out patient ophthalmology follow-up along with pulmonary follow-up for lung nodule.   Home Health: PT-OT   Equipment/Devices: None  Consultations: Neuro Discharge Condition: Stable    CODE STATUS: Full    Diet Recommendation: Heart Healthy   Diet Order             Diet - low sodium heart healthy           Diet Heart Room service appropriate? Yes; Fluid consistency: Thin  Diet effective now                    Chief Complaint  Patient presents with   Dizziness     Brief history of present illness from the day of admission and additional interim summary    Jessica Mullins is a 87 y.o. female with medical history significant of hx of left breat cancer, atrial fibrillation on eliquis, HLD, HTN who presented to [facility] with vision changes.  MRI suggestive of acute stroke.                                                                 Hospital Course       Acute on chronic visual field defects mostly right temporal.  She has chronic vision issues in her left eye since childhood.  MRI confirms left parietal lobe cortex acute infarct, CTA noted, case discussed with neurology.  Continue Eliquis along with statin, aspirin was offered by neurology but patient did not want to take any additional medications at this time, LDL and A1c are stable, per stroke team CVA was likely incidental and not related to her right  temporal vision loss in the right eye, requested to follow-up with ophthalmology outpatient, she was also requested to cover her right eye if she has any sensation of diplopia and seek medical attention at that point.  Currently symptoms up close to baseline.  She had no speech issues and speech evaluation is not deemed necessary, cleared by neurology for discharge at this point.  No further work-up. TTE DW Dr Dwyane Dee, stable for DC - follow with PCP.    2.  Meningioma.  Outpatient follow-up with neurosurgery.   3.  Left upper lobe lung nodule.  Follow-up with pulmonary within a month for repeat CT soon thereafter.   4.  Paroxysmal atrial fibrillation [place] vas 2 score of greater than 3.  Continue Eliquis for anticoagulation, as needed IV Cardizem for now oral Cardizem held for permissive hypertension.  Continue flecainide.   5.  Dyslipidemia.  Continue home dose statin.  LDL at goal.   6.  Vitamin B12 deficiency.  Continue on oral replacement outpatient follow-up with PCP.   Discharge diagnosis     Active Problems:   Hyperlipidemia   Essential hypertension   Atrial fibrillation (HCC)   Meningioma (HCC)   Acute CVA (cerebrovascular accident) [facility])    Discharge instructions    Discharge Instructions     Ambulatory referral to Physical Therapy   Complete by: As directed    Diet - low sodium heart healthy   Complete by: As directed    Discharge instructions   Complete by: As directed    If you have diplopia cover the affected eye and seek medical attention right away.  Please do not drive if you have diplopia.    Follow with Primary MD Virgie Dad, MD and your ophthalmologist in 7 days   Get CBC, CMP, 2 view Chest X ray -  checked next visit within 1 week by Primary MD    Activity: As tolerated with Full fall precautions use walker/cane & assistance as needed  Disposition Home  Diet: Heart Healthy         Special Instructions: If you have smoked or chewed Tobacco   in the last 2 yrs please stop smoking, stop any regular Alcohol  and or any Recreational drug use.  On your next visit with your primary care physician please Get Medicines reviewed and adjusted.  Please request your Prim.MD to go over all Hospital Tests and Procedure/Radiological results at the follow up, please get all Hospital records sent to your Prim MD by signing hospital release before you go home.  If you experience worsening of your admission symptoms, develop shortness of breath, life threatening emergency, suicidal or homicidal thoughts you must seek medical attention immediately by calling 911 or calling your MD immediately  if symptoms less severe.  You Must read complete instructions/literature along with all the possible adverse reactions/side effects for all the Medicines you take and that have been prescribed to you. Take any new Medicines after you have completely understood and accpet all the possible adverse reactions/side effects.   Increase activity slowly   Complete by: As directed        Discharge Medications   Allergies as of 05/11/2021       Reactions   Sulfa Antibiotics Other (See Comments)   As a child, rigid as a stick and not responsive   Tape Other (See Comments)   Blisters, Please use "paper" tape only for short periods of time   Glucosamine Forte [nutritional Supplements] Rash   Latex Rash        Medication List     STOP taking these medications    methocarbamol 500 MG tablet Commonly known as: ROBAXIN       TAKE these medications    AMBULATORY NON FORMULARY MEDICATION Walker use daily as directed.  Disp 1 Leg pain M79.604   Biotin 5 MG Caps Take 5 mg by mouth daily.   Calcium Carbonate-Vitamin D 600-400 MG-UNIT tablet Take 1 tablet by mouth daily.   cycloSPORINE 0.05 % ophthalmic emulsion Commonly known as: RESTASIS Place 1 drop into both eyes 2 (two) times daily.   diltiazem 30 MG tablet Commonly known as: CARDIZEM TAKE 1  TABLET BY MOUTH TWICE DAILY.   diphenhydramine-acetaminophen 25-500 MG  Tabs tablet Commonly known as: TYLENOL PM Take 2 tablets by mouth at bedtime as needed (pain).   Eliquis 5 MG Tabs tablet Generic drug: apixaban TAKE 1 TABLET BY MOUTH TWICE DAILY. What changed: how much to take   FISH OIL PO Take 1 tablet by mouth daily.   flecainide 50 MG tablet Commonly known as: TAMBOCOR Take 1 tablet (50 mg total) by mouth 2 (two) times daily.   ketoconazole 2 % cream Commonly known as: NIZORAL APPLY TO AFFECTED AREA ONCE DAILY AS NEEDED FOR ITCHING. What changed: See the new instructions.   METAMUCIL PO Take 1 Dose by mouth daily. 1 teaspoon   Pfizer-BioNT COVID-19 Vac-TriS Susp injection Generic drug: COVID-19 mRNA Vac-TriS (Pfizer) Inject into the muscle.   polyethylene glycol 17 g packet Commonly known as: MIRALAX / GLYCOLAX Take 17 g by mouth daily.   pravastatin 40 MG tablet Commonly known as: PRAVACHOL TAKE 1 TABLET ONCE DAILY.   PreserVision AREDS Tabs Take 2 tablets by mouth daily.   Turmeric 500 MG Caps Take 500 capsules by mouth daily.   vitamin B-12 1000 MCG tablet Commonly known as: CYANOCOBALAMIN Take 1 tablet (1,000 mcg total) by mouth daily.   Vitamin D3 25 MCG (1000 UT) Caps Take 1,000 Units by mouth daily.         Follow-up Information     Virgie Dad, MD. Schedule an appointment as soon as possible for a visit in 1 week(s).   Specialty: Internal Medicine Why: and your Opthalmologist in 1 week Contact information: [place] [address] [phone]         Sanda Klein, MD .   Specialty: Cardiology Contact information: [address] [place] [address] [phone]         Virgie Dad, MD Follow up.   Specialty: Internal Medicine Contact information: [place] [address] [phone]         Sanda Klein, MD .   Specialty: Cardiology Contact  information: [address] [place] [address] [phone]         Garner Nash, DO. Schedule an appointment as soon as possible for a visit in 1 week(s).   Specialty: Pulmonary Disease Why: Lung Nodule Contact information: [address] [place] [address] [phone]                 Major procedures and Radiology Reports - PLEASE review detailed and final reports thoroughly  -       CT HEAD WO CONTRAST  Result Date: 05/10/2021 CLINICAL DATA:  Focal neuro deficit greater than 6 hours. Stroke suspected. Blurred vision in right eye since yesterday. Dizziness and generalized weakness. History of meningioma at the craniocervical junction. The EXAM: CT HEAD WITHOUT CONTRAST TECHNIQUE: Contiguous axial images were obtained from the base of the skull through the vertex without intravenous contrast. COMPARISON:  CT head 10/05/2019.  MRI brain 12/10/2020. FINDINGS: Brain: The known large posterior fossa mass extending into the foramen magnum appears slightly larger compared with 2021 CT, measuring 2.9 x 2.0 cm transverse on image 4/2 and 2.8 cm on coronal image 21/4. The appearance is similar to the more recent MRI of 5 months ago. There is significant chronic mass effect on the brainstem which is displaced posteriorly to the left. There is no evidence of acute intracranial hemorrhage, new mass lesion, brain edema or extra-axial fluid collection. The ventricles and subarachnoid spaces are appropriately sized  for age. There is no CT evidence of acute cortical infarction. There are stable chronic small vessel ischemic changes in the periventricular white matter. Vascular: Intracranial vascular calcifications. No hyperdense vessel identified. Skull: Negative for fracture or focal lesion. Sinuses/Orbits: The visualized paranasal sinuses and mastoid air cells are clear. No orbital abnormalities are seen. Other: Stable postsurgical changes within both globes.  IMPRESSION: 1. No evidence of acute intracranial hemorrhage or acute stroke. 2. Known large mass in the posterior fossa with extension into the foramen magnum appears slightly larger from CT 10/05/2019, although not grossly changed from the more recent MRI of 5 months ago. This exerts significant mass effect on the brainstem and may be symptomatic. No associated brain edema or increased ventriculomegaly. 3. Grossly stable chronic small vessel ischemic changes. Electronically Signed   By: Richardean Sale M.D.   On: 05/10/2021 09:08   MR Brain W and Wo Contrast  Result Date: 05/10/2021 CLINICAL DATA:  Brain mass or lesion, focal neuro deficit EXAM: MRI HEAD WITHOUT AND WITH CONTRAST TECHNIQUE: Multiplanar, multiecho pulse sequences of the brain and surrounding structures were obtained without and with intravenous contrast. CONTRAST:  49mL GADAVIST GADOBUTROL 1 MMOL/ML IV SOLN COMPARISON:  12/10/2020 FINDINGS: Brain: Punctate area of restricted diffusion in the anterior left parietal lobe cortex (series 3, image 39-40 and series 4, image 13), with ADC correlate and without definite associated increased T2 signal. Redemonstrated enhancing mass in the lower right posterior fossa, most likely a meningioma, which compresses lower brainstem and displaces the vertebral arteries. The mass measures approximately 2.9 X 2.1 x 2.5 cm (series 11, image 11 and series 13, image 12), previously 2.8 x 2.1 x 2.6 cm when remeasured similarly, overall unchanged. The mass continues to overlie the right hypoglossal canal, with regional dural thickening along the posterior clivus. There is mass effect on the lower fourth ventricle, but unchanged size and configuration the superior aspect of the fourth ventricle no definite hydrocephalus. No new enhancing mass. No cerebral mass effect or midline shift. No extra-axial collection. T2 hyperintense signal in the periventricular white matter, likely the sequela of chronic small vessel  ischemic disease. Vascular: Normal flow voids. Skull and upper cervical spine: Normal marrow signal. Sinuses/Orbits: Negative.  Status post bilateral lens replacements. Other: The mastoids are well aerated. IMPRESSION: 1. Punctate area of restricted diffusion in the anterior left parietal lobe cortex, consistent with acute infarct. 2. Overall unchanged posterior fossa mass, most likely a meningioma. These results were called by telephone at the time of interpretation on 05/10/2021 at 1:59 pm to provider Dr. Roslynn Amble , who verbally acknowledged these results. Electronically Signed   By: Merilyn Baba M.D.   On: 05/10/2021 14:02   ECHOCARDIOGRAM COMPLETE  Result Date: 05/11/2021    ECHOCARDIOGRAM REPORT   Patient Name:   Jessica Mullins Date of Exam: 05/11/2021 Medical Rec #:  [id number]        Height:       61.0 in Accession #:    [id number]       Weight:       160.0 lb Date of Birth:  1930-10-22        BSA:          1.718 m Patient Age:    14 years         BP:           141/82 mmHg Patient Gender: F                HR:  72 bpm. Exam Location:  Inpatient Procedure: 2D Echo, Cardiac Doppler and Color Doppler Indications:    CVA  History:        Patient has prior history of Echocardiogram examinations, most                 recent 08/12/2015. Arrythmias:Atrial Fibrillation; Risk                 Factors:Hypertension and Dyslipidemia.  Sonographer:    Dustin Flock RDCS Referring Phys: [id number] [place]  1. A small pericardial effusion is present. The pericardial effusion is anterior to the right ventricle. There is no RV or RA collapse suggestive of increased pericardial pressures.  2. The inferior vena cava is normal in size with greater than 50% respiratory variability, suggesting right atrial pressure of 3 mmHg.  3. Caseous mitral annular calcifification noted. The mitral valve is abnormal. No evidence of mitral valve regurgitation. No evidence of mitral stenosis.  4. Left ventricular  ejection fraction, by estimation, is 55 to 60%. The left ventricle has normal function. The left ventricle has no regional wall motion abnormalities. There is mild concentric left ventricular hypertrophy. Left ventricular diastolic parameters are consistent with Grade II diastolic dysfunction (pseudonormalization).  5. Right ventricular systolic function is normal. The right ventricular size is normal. There is normal pulmonary artery systolic pressure.  6. Left atrial size was mildly dilated.  7. The aortic valve was not well visualized. Aortic valve regurgitation is not visualized. No aortic stenosis is present. Comparison(s): Prior images unable to be directly viewed, comparison made by report only. Similar from prior report. FINDINGS  Left Ventricle: Left ventricular ejection fraction, by estimation, is 55 to 60%. The left ventricle has normal function. The left ventricle has no regional wall motion abnormalities. The left ventricular internal cavity size was normal in size. There is  mild concentric left ventricular hypertrophy. Left ventricular diastolic parameters are consistent with Grade II diastolic dysfunction (pseudonormalization). Right Ventricle: The right ventricular size is normal. No increase in right ventricular wall thickness. Right ventricular systolic function is normal. There is normal pulmonary artery systolic pressure. The tricuspid regurgitant velocity is 2.59 m/s, and  with an assumed right atrial pressure of 3 mmHg, the estimated right ventricular systolic pressure is 18.5 mmHg. Left Atrium: Left atrial size was mildly dilated. Right Atrium: Right atrial size was normal in size. Pericardium: A small pericardial effusion is present. The pericardial effusion is anterior to the right ventricle. Mitral Valve: Caseous mitral annular calcifification noted. The mitral valve is abnormal. No evidence of mitral valve regurgitation. No evidence of mitral valve stenosis. Tricuspid Valve: The tricuspid  valve is normal in structure. Tricuspid valve regurgitation is mild . No evidence of tricuspid stenosis. Aortic Valve: The aortic valve was not well visualized. Aortic valve regurgitation is not visualized. No aortic stenosis is present. Pulmonic Valve: The pulmonic valve was not well visualized. Pulmonic valve regurgitation is not visualized. No evidence of pulmonic stenosis. Aorta: The aortic root and ascending aorta are structurally normal, with no evidence of dilitation. Venous: The inferior vena cava is normal in size with greater than 50% respiratory variability, suggesting right atrial pressure of 3 mmHg. IAS/Shunts: The atrial septum is grossly normal.  LEFT VENTRICLE PLAX 2D LVIDd:         4.20 cm   Diastology LVIDs:         2.40 cm   LV e' medial:    3.15 cm/s LV PW:  1.20 cm   LV E/e' medial:  17.0 LV IVS:        1.30 cm   LV e' lateral:   3.92 cm/s LVOT diam:     1.90 cm   LV E/e' lateral: 13.7 LV SV:         69 LV SV Index:   40 LVOT Area:     2.84 cm  RIGHT VENTRICLE RV Basal diam:  2.60 cm RV S prime:     3.59 cm/s LEFT ATRIUM             Index        RIGHT ATRIUM           Index LA diam:        4.00 cm 2.33 cm/m   RA Area:     11.90 cm LA Vol (A2C):   65.5 ml 38.13 ml/m  RA Volume:   22.60 ml  13.16 ml/m LA Vol (A4C):   55.2 ml 32.13 ml/m LA Biplane Vol: 60.0 ml 34.93 ml/m  AORTIC VALVE LVOT Vmax:   118.00 cm/s LVOT Vmean:  82.700 cm/s LVOT VTI:    0.245 m  AORTA Ao Root diam: 3.00 cm MITRAL VALVE                TRICUSPID VALVE MV Area (PHT): 1.82 cm     TR Peak grad:   26.8 mmHg MV Decel Time: 416 msec     TR Vmax:        259.00 cm/s MV E velocity: 53.60 cm/s MV A velocity: 127.00 cm/s  SHUNTS MV E/A ratio:  0.42         Systemic VTI:  0.24 m                             Systemic Diam: 1.90 cm Rudean Haskell MD Electronically signed by Rudean Haskell MD Signature Date/Time: 05/11/2021/4:04:50 PM    Final    CT ANGIO HEAD NECK W WO CM (CODE STROKE)  Result Date:  05/10/2021 CLINICAL DATA:  Initial evaluation for acute stroke. EXAM: CT ANGIOGRAPHY HEAD AND NECK TECHNIQUE: Multidetector CT imaging of the head and neck was performed using the standard protocol during bolus administration of intravenous contrast. Multiplanar CT image reconstructions and MIPs were obtained to evaluate the vascular anatomy. Carotid stenosis measurements (when applicable) are obtained utilizing NASCET criteria, using the distal internal carotid diameter as the denominator. CONTRAST:  49mL OMNIPAQUE IOHEXOL 350 MG/ML SOLN COMPARISON:  Prior CT and MRI from earlier the same day. FINDINGS: CTA NECK FINDINGS Aortic arch: Visualized aortic arch normal in caliber with normal branch pattern. Mild atheromatous change about the arch and origin of the great vessels without stenosis. Right carotid system: Right common and internal carotid arteries widely patent without stenosis, dissection or occlusion. Mild for age plaque about the right carotid bulb without stenosis. Left carotid system: Left common and internal carotid arteries widely patent without stenosis, dissection or occlusion. Mild for age plaque about the left carotid bulb without stenosis. Vertebral arteries: Both vertebral arteries arise from the subclavian arteries. No proximal subclavian artery stenosis. Both vertebral arteries widely patent without stenosis, dissection or occlusion. Skeleton: No worrisome lytic or blastic osseous lesions. Mild for age multilevel cervical spondylosis without significant spinal stenosis. Other neck: No other acute soft tissue abnormality within the neck. Upper chest: 6 mm nodule present at the left lung apex (series 6, image 129), indeterminate. Visualized upper  chest demonstrates no other acute finding. Review of the MIP images confirms the above findings CTA HEAD FINDINGS Anterior circulation: Both internal carotid arteries patent to the termini without stenosis. Mild for age atheromatous change within the  carotid siphons without significant stenosis. A1 segments patent bilaterally. Normal anterior communicating artery complex. Anterior cerebral arteries patent without stenosis. No M1 stenosis or occlusion. Normal MCA bifurcations. Distal MCA branches well perfused and symmetric. Posterior circulation: Dominant left vertebral artery widely patent without stenosis. Diminutive right vertebral artery displaced around the right posterior fossa mass, but remains widely patent. Both PICA origins patent and normal. Basilar patent to its distal aspect without stenosis. Superior cerebral arteries patent bilaterally. Both PCAs primarily supplied via the basilar well perfused or distal aspects. Venous sinuses: Not well assessed due to timing the contrast bolus. Anatomic variants: None significant.  No aneurysm. Review of the MIP images confirms the above findings IMPRESSION: 1. Negative CTA for large vessel occlusion. 2. Mild for age atheromatous change about the carotid bifurcations and carotid siphons without hemodynamically significant or correctable stenosis. 3. Mass effect on the right V4 segment by the adjacent right posterior fossa mass without stenosis. 4. 6 mm left upper lobe nodule, indeterminate. Non-contrast chest CT at 6-12 months is recommended. If the nodule is stable at time of repeat CT, then future CT at 18-24 months (from today's scan) is considered optional for low-risk patients, but is recommended for high-risk patients. This recommendation follows the consensus statement: Guidelines for Management of Incidental Pulmonary Nodules Detected on CT Images: From the Fleischner Society 2017; Radiology 2017; 284:228-243. Electronically Signed   By: Jeannine Boga M.D.   On: 05/10/2021 22:46     Today   Subjective    Jessica Mullins today has no headache,no chest abdominal pain,no new weakness tingling or numbness, feels much better wants to go home today.     Objective   Blood pressure (!) 141/82,  pulse 72, temperature (!) 97.4 F (36.3 C), temperature source Oral, resp. rate 20, height 5\' 1"  (1.549 m), weight 72.6 kg, SpO2 98 %.  No intake or output data in the 24 hours ending 05/11/21 1616  Exam  Awake Alert, No new F.N deficits, Normal affect [place].AT,PERRAL Supple Neck,No JVD, No cervical lymphadenopathy appriciated.  Symmetrical Chest wall movement, Good air movement bilaterally, CTAB RRR,No Gallops,Rubs or new Murmurs, No Parasternal Heave +ve B.Sounds, Abd Soft, Non tender, No organomegaly appriciated, No rebound -guarding or rigidity. No Cyanosis, Clubbing or edema, No new Rash or bruise   Data Review   CBC w Diff:  Lab Results  Component Value Date   WBC 7.7 05/10/2021   HGB 15.3 (H) 05/10/2021   HGB 13.2 09/21/2015   HCT 45.4 05/10/2021   HCT 40.3 09/21/2015   PLT 193 05/10/2021   PLT 282 09/21/2015   LYMPHOPCT 22 05/10/2021   LYMPHOPCT 11.9 (L) 09/21/2015   MONOPCT 7 05/10/2021   MONOPCT 7.7 09/21/2015   EOSPCT 1 05/10/2021   EOSPCT 0.3 09/21/2015   BASOPCT 0 05/10/2021   BASOPCT 0.8 09/21/2015    CMP:  Lab Results  Component Value Date   NA 140 05/10/2021   NA 143 02/11/2021   NA 140 09/21/2015   K 3.9 05/10/2021   K 4.8 09/21/2015   CL 104 05/10/2021   CO2 28 05/10/2021   CO2 30 (H) 09/21/2015   BUN 13 05/10/2021   BUN 17 02/11/2021   BUN 9.5 09/21/2015   CREATININE 0.76 05/10/2021   CREATININE 0.8 09/21/2015  GLU 104 02/11/2021   PROT 6.4 (L) 05/10/2021   PROT 6.7 09/21/2015   ALBUMIN 4.3 05/10/2021   ALBUMIN 3.3 (L) 09/21/2015   BILITOT 0.7 05/10/2021   BILITOT 0.31 09/21/2015   ALKPHOS 46 05/10/2021   ALKPHOS 76 09/21/2015   AST 19 05/10/2021   AST 19 09/21/2015   ALT 13 05/10/2021   ALT 17 09/21/2015  .  Lab Results  Component Value Date   HGBA1C 4.8 05/11/2021    Lab Results  Component Value Date   CHOL 146 05/11/2021   HDL 59 05/11/2021   LDLCALC 70 05/11/2021   LDLDIRECT 153.4 11/16/2009   TRIG 86 05/11/2021    CHOLHDL 2.5 05/11/2021    Total Time in preparing paper work, data evaluation and todays exam - 46 minutes  Lala Lund M.D on 05/11/2021 at 4:16 PM  [facility]

## 2021-05-11 NOTE — Progress Notes (Addendum)
STROKE TEAM PROGRESS NOTE   ATTENDING NOTE: I reviewed above note and agree with the assessment and plan. Pt was seen and examined.   87 year old female with history of A. fib on Eliquis, hypertension, hyperlipidemia, known large posterior fossa meningioma admitted for diplopia.  Per patient, she had double vision yesterday lasted half day and resolved.  This morning she had another episode of double vision but resolved when working with PT/OT.  However, she does have left "lazy eye" for her whole life and she generally seeing thing out of her right eye with left eye vision suppressed. She can not tell me whether the double vision coming out from right eye alone or binocular.  She thinks it likely to be the right eye alone. I told her that monocular diplopia likely ophthalmological condition, she said she had retina specialist and follow her up.  She also complained of visual disturbance with episodes of "objects jumping moving", lasting 5 minutes to 1 hour variably.  Sometimes happens once a week and sometimes happens 2-3 times a week.  He denied any dizziness, vertigo, ear ringing, headache or nausea vomiting.  Last episode has been for some time.  She also complaining of imbalance on her feet, but not associate with visual disturbance.  She had several falls in the past, however since she moved to [place] home, no more falls but still sometimes her legs feels like rubber.  On exam, patient awake alert, orientated x3, no aphasia, follows simple commands.  No diplopia on my exam, visual fields full, no gaze palsy, facial symmetrical.  Bilateral upper and lower extremities equal strengths sensation.  Finger-to-nose intact bilaterally.  CT no acute abnormality, no large posterior fossa meningioma.  MRI showed stable meningioma, tiny left superior frontoparietal infarct.  CTA head and neck showed right V4 compression by the meningioma but no occlusion.  EF 55 to 60%.  A1c 4.8, LDL 70, creatinine  0.76.  Etiology for patient punctate left superior frontal parietal infarct likely incidental finding and asymptomatic.  Okay to continue Eliquis and patient declined adding aspirin 81 on top of Eliquis. Continue pravastatin 40.  PT/OT recommend outpatient PT/OT.  No speech therapy needs at this time.  Etiology for patient diplopia episode not quite clear.  Asked patient to cover one eye and then the other to see if monocular versus binocular diplopia when this happened next time.  If monocular diplopia, she needs to continue follow-up with her ophthalmologist.  If binocular diplopia, it could be TIA given she had PAF on Eliquis and recommend her to call 911.  Likely not related to right V4 compression given no cerebellar involvement at this time.     For detailed assessment and plan, please refer to above as I have made changes wherever appropriate.   Neurology will sign off. Please call with questions. Pt will follow up with Dr. Tomi Likens at [facility] on 06/04/21. Thanks for the consult.   Rosalin Hawking, MD PhD Stroke Neurology 05/11/2021 6:24 PM  I had long discussion with family at bedside, updated pt current condition, treatment plan and potential prognosis, and answered all the questions.  She expressed understanding and appreciation. I discussed with Dr. Candiss Norse. I spent  35 minutes in total face-to-face time with the patient, more than 50% of which was spent in counseling and coordination of care, reviewing test results, images and medication, and discussing the diagnosis, treatment plan and potential prognosis. This patient's care requiresreview of multiple databases, neurological assessment, discussion with family, other specialists  and medical decision making of high complexity.      INTERVAL HISTORY No one is at the bedside at time of this exam. Pt met sitting in chair in no distress.   MRI brain shows punctate stroke at left parietal lobe cortex and known posterior fossa mass.  Vitals:    05/10/21 2316 05/11/21 0418 05/11/21 0430 05/11/21 0806  BP: (!) 149/66 (!) 138/107 132/90 (!) 145/73  Pulse: 60 (!) 59 69 (!) 58  Resp: 19 18  16   Temp: 98.4 F (36.9 C) 98.2 F (36.8 C)  97.7 F (36.5 C)  TempSrc: Oral Oral  Oral  SpO2: 99% 96%  95%  Weight:      Height:       CBC:  Recent Labs  Lab 05/10/21 0824  WBC 7.7  NEUTROABS 5.4  HGB 15.3*  HCT 45.4  MCV 92.1  PLT 585   Basic Metabolic Panel:  Recent Labs  Lab 05/10/21 0824  NA 140  K 3.9  CL 104  CO2 28  GLUCOSE 92  BUN 13  CREATININE 0.76  CALCIUM 9.8    Lipid Panel:  Recent Labs  Lab 05/11/21 0128  CHOL 146  TRIG 86  HDL 59  CHOLHDL 2.5  VLDL 17  LDLCALC 70     HgbA1c:  Recent Labs  Lab 05/11/21 0128  HGBA1C 4.8   Urine Drug Screen: No results for input(s): LABOPIA, COCAINSCRNUR, LABBENZ, AMPHETMU, THCU, LABBARB in the last 168 hours.  Alcohol Level  Recent Labs  Lab 05/10/21 0824  ETH <10    IMAGING past 24 hours MR Brain W and Wo Contrast  Result Date: 05/10/2021 CLINICAL DATA:  Brain mass or lesion, focal neuro deficit EXAM: MRI HEAD WITHOUT AND WITH CONTRAST TECHNIQUE: Multiplanar, multiecho pulse sequences of the brain and surrounding structures were obtained without and with intravenous contrast. CONTRAST:  54m GADAVIST GADOBUTROL 1 MMOL/ML IV SOLN COMPARISON:  12/10/2020 FINDINGS: Brain: Punctate area of restricted diffusion in the anterior left parietal lobe cortex (series 3, image 39-40 and series 4, image 13), with ADC correlate and without definite associated increased T2 signal. Redemonstrated enhancing mass in the lower right posterior fossa, most likely a meningioma, which compresses lower brainstem and displaces the vertebral arteries. The mass measures approximately 2.9 X 2.1 x 2.5 cm (series 11, image 11 and series 13, image 12), previously 2.8 x 2.1 x 2.6 cm when remeasured similarly, overall unchanged. The mass continues to overlie the right hypoglossal canal, with  regional dural thickening along the posterior clivus. There is mass effect on the lower fourth ventricle, but unchanged size and configuration the superior aspect of the fourth ventricle no definite hydrocephalus. No new enhancing mass. No cerebral mass effect or midline shift. No extra-axial collection. T2 hyperintense signal in the periventricular white matter, likely the sequela of chronic small vessel ischemic disease. Vascular: Normal flow voids. Skull and upper cervical spine: Normal marrow signal. Sinuses/Orbits: Negative.  Status post bilateral lens replacements. Other: The mastoids are well aerated. IMPRESSION: 1. Punctate area of restricted diffusion in the anterior left parietal lobe cortex, consistent with acute infarct. 2. Overall unchanged posterior fossa mass, most likely a meningioma. These results were called by telephone at the time of interpretation on 05/10/2021 at 1:59 pm to provider Dr. DRoslynn Amble, who verbally acknowledged these results. Electronically Signed   By: AMerilyn BabaM.D.   On: 05/10/2021 14:02   CT ANGIO HEAD NECK W WO CM (CODE STROKE)  Result Date: 05/10/2021 CLINICAL  DATA:  Initial evaluation for acute stroke. EXAM: CT ANGIOGRAPHY HEAD AND NECK TECHNIQUE: Multidetector CT imaging of the head and neck was performed using the standard protocol during bolus administration of intravenous contrast. Multiplanar CT image reconstructions and MIPs were obtained to evaluate the vascular anatomy. Carotid stenosis measurements (when applicable) are obtained utilizing NASCET criteria, using the distal internal carotid diameter as the denominator. CONTRAST:  42m OMNIPAQUE IOHEXOL 350 MG/ML SOLN COMPARISON:  Prior CT and MRI from earlier the same day. FINDINGS: CTA NECK FINDINGS Aortic arch: Visualized aortic arch normal in caliber with normal branch pattern. Mild atheromatous change about the arch and origin of the great vessels without stenosis. Right carotid system: Right common and  internal carotid arteries widely patent without stenosis, dissection or occlusion. Mild for age plaque about the right carotid bulb without stenosis. Left carotid system: Left common and internal carotid arteries widely patent without stenosis, dissection or occlusion. Mild for age plaque about the left carotid bulb without stenosis. Vertebral arteries: Both vertebral arteries arise from the subclavian arteries. No proximal subclavian artery stenosis. Both vertebral arteries widely patent without stenosis, dissection or occlusion. Skeleton: No worrisome lytic or blastic osseous lesions. Mild for age multilevel cervical spondylosis without significant spinal stenosis. Other neck: No other acute soft tissue abnormality within the neck. Upper chest: 6 mm nodule present at the left lung apex (series 6, image 129), indeterminate. Visualized upper chest demonstrates no other acute finding. Review of the MIP images confirms the above findings CTA HEAD FINDINGS Anterior circulation: Both internal carotid arteries patent to the termini without stenosis. Mild for age atheromatous change within the carotid siphons without significant stenosis. A1 segments patent bilaterally. Normal anterior communicating artery complex. Anterior cerebral arteries patent without stenosis. No M1 stenosis or occlusion. Normal MCA bifurcations. Distal MCA branches well perfused and symmetric. Posterior circulation: Dominant left vertebral artery widely patent without stenosis. Diminutive right vertebral artery displaced around the right posterior fossa mass, but remains widely patent. Both PICA origins patent and normal. Basilar patent to its distal aspect without stenosis. Superior cerebral arteries patent bilaterally. Both PCAs primarily supplied via the basilar well perfused or distal aspects. Venous sinuses: Not well assessed due to timing the contrast bolus. Anatomic variants: None significant.  No aneurysm. Review of the MIP images confirms  the above findings IMPRESSION: 1. Negative CTA for large vessel occlusion. 2. Mild for age atheromatous change about the carotid bifurcations and carotid siphons without hemodynamically significant or correctable stenosis. 3. Mass effect on the right V4 segment by the adjacent right posterior fossa mass without stenosis. 4. 6 mm left upper lobe nodule, indeterminate. Non-contrast chest CT at 6-12 months is recommended. If the nodule is stable at time of repeat CT, then future CT at 18-24 months (from today's scan) is considered optional for low-risk patients, but is recommended for high-risk patients. This recommendation follows the consensus statement: Guidelines for Management of Incidental Pulmonary Nodules Detected on CT Images: From the Fleischner Society 2017; Radiology 2017; 284:228-243. Electronically Signed   By: BJeannine BogaM.D.   On: 05/10/2021 22:46    PHYSICAL EXAM  Mental Status: Awake and alert. Speech fluent with intact comprehension and naming. No dysarthria. Fully oriented. Cranial Nerves: II: Temporal visual fields intact with no extinction to DSS. PERRL.  III,IV, VI: EOMI. No ptosis. No nystagmus. No double vision during exam but did complain of difficulty maintaining far rightward gaze.  V: Temp sensation intact bilaterally.  VII: Smile symmetric VIII: Hearing intact to conversation  IX,X: No hypophonia or hoarseness XI: Head is midline XII: Midline tongue extension Motor: Right :  Upper extremity 5/5                Lower extremity 5/5                                Left:     Upper extremity   5/5             Lower extremity 5/5                No pronator drift Sensory: Temp and light touch intact throughout, bilaterally.No extinction to DSS.  Deep Tendon Reflexes: 2+ and symmetric brachioradialis and patellae Cerebellar: No ataxia with FNF bilaterally Gait: Deferred    ASSESSMENT/PLAN Ms. Jessica Mullins is a 87 y.o. female with history of atrial  fibrillation, on Eliquis, HTN, HLD, left breast cancer, posterior fossa meningioma, who presented to the hospital with chief complaint of double vision starting on Saturday. She states that she has had intermittent mild double vision in the past, but that this episode was worse. Her double vision at time of interview is resolved.   She also had some intermittent dizziness, which has been going on more frequently for the past few weeks. She denies unilateral weakness, but has had several months of intermittent lower extremity weakness involving either leg but not both, with giveway that has resulted in falls. She also notices that over the past few weeks she has been searching for the right words and trying to make them come out clearly. She has no prior history of stroke.    MRI brain revealed a punctate cortically based stroke in the left parietofrontal region  Stroke, incidental finding - left frontoparietal punctate infarct embolic secondary to PAF even on eliquis CT head:  1. No evidence of acute intracranial hemorrhage or acute stroke. 2. Known large mass in the posterior fossa with extension into the foramen magnum appears slightly larger from CT 10/05/2019, although not grossly changed from the more recent MRI of 5 months ago. This exerts significant mass effect on the brainstem and may be symptomatic. No associated brain edema or increased ventriculomegaly. 3. Grossly stable chronic small vessel ischemic changes.  CTA HEAD: 1. Negative CTA for large vessel occlusion. 2. Mild for age atheromatous change about the carotid bifurcations and carotid siphons without hemodynamically significant or correctable stenosis. 3. Mass effect on the right V4 segment by the adjacent right posterior fossa mass without stenosis. 4. 6 mm left upper lobe nodule, indeterminate. Non-contrast chest CT at 6-12 months is recommended.   MRI   BRAIN:  1. Punctate area of restricted diffusion in the anterior  left parietal lobe cortex, consistent with acute infarct. 2. Overall unchanged posterior fossa mass, most likely a meningioma. The mass measures approximately 2.9 X 2.1 x 2.5 cm (series 11, image 11 and series 13, image 12), previously 2.8 x 2.1 x 2.6 cm when remeasured similarly, overall unchanged.  2D Echo pending   LDL 70 HgbA1c 4.8 VTE prophylaxis - scd     Diet   Diet Heart Room service appropriate? Yes; Fluid consistency: Thin   Eliquis (apixaban) daily prior to admission, now on and Eliquis (apixaban) daily.  Continue on discharge Therapy recommendations:  pending  Disposition:  pending   Hypertension Home meds:  cardizem Stable Permissive hypertension (OK if < 220/120) but gradually normalize in 5-7 days  Long-term BP goal normotensive  Hyperlipidemia Home meds:   pravastatin 40,  resumed in hospital LDL 70, goal < 70  High intensity statin   Continue statin at discharge    Other Stroke Risk Factors  Advanced Age >/= 37   Obesity, Body mass index is 30.23 kg/m., BMI >/= 30 associated with increased stroke risk, recommend weight loss, diet and exercise as appropriate      Other Active Problems    Meningioma (HCC) -appears stable from previous imaging. Has followed with neurosurgery outpatient who has just been monitoring due to age and stable appearance  Hospital day # 0     To contact Stroke Continuity provider, please refer to [email or web address] After hours, contact General Neurology

## 2021-05-11 NOTE — TOC Transition Note (Signed)
Transition of Care [facility]) - CM/SW Discharge Note   Patient Details  Name: Aahana L Calico MRN: [id number] Date of Birth: 06/25/1931  Transition of Care [facility]) CM/SW Contact:  Pollie Friar, RN Phone Number: 05/11/2021, 3:37 PM   Clinical Narrative:    Patient is discharging back to her apartment at [facility]. Recommendations are for outpatient therapy. Pt states she has had therapy through [facility] in the past. CM has left a Advertising account executive for [place] at [place] to see about arranging therapies and transport home.  Pt states she manages her own medications and has no issues.  Pt states [facility] provides her needed transportation.    Final next level of care: OP Rehab Barriers to Discharge: No Barriers Identified   Patient Goals and CMS Choice     Choice offered to / list presented to : Patient  Discharge Placement                       Discharge Plan and Services   Discharge Planning Services: CM Consult                                 Social Determinants of Health (SDOH) Interventions     Readmission Risk Interventions No flowsheet data found.

## 2021-05-11 NOTE — Evaluation (Signed)
Occupational Therapy Evaluation Patient Details Name: Jessica Mullins MRN: [id number] DOB: 1931-06-02 Today's Date: 05/11/2021   History of Present Illness Jessica Mullins is a 87 y.o. 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 Pt with medical history significant of hx of left breat cancer, atrial fibrillation on eliquis, HLD, HTN   Clinical Impression   Jessica Mullins was mod I PTA, she lives at [employer] [employer]. She uses a rollator or RW for ambulation, and is typically indep with ADLs, she walks to cafeteria/restaurants for most meals. Pt is completing ADLs and functional mobility with RW at a supervision - min guard level for safety only. She stated that her vision changes come and go, and describes her symptoms as double vision and blurry vision. Educated pt on occluding her L eye vision to help with double vision. Without glasses, pt stated the words in the stroke book were "jumping around" with the L nasal portion of the eye occluded, pt was a better more fluent reader. Pt was also educated on BE FAST, and encouraged to read stroke booklet. Pt would benefit from continued OT acutely. Recommend d/c back to ILF with intermittent supervision from family.      Recommendations for follow up therapy are one component of a multi-disciplinary discharge planning process, led by the attending physician.  Recommendations may be updated based on patient status, additional functional criteria and insurance authorization.   Follow Up Recommendations  Other (comment) (recommend pt follow up with eye doctor)    Assistance Recommended at Discharge Intermittent Supervision/Assistance  Functional Status Assessment  Patient has had a recent decline in their functional status and demonstrates the ability to make significant improvements in function in a reasonable and predictable amount of time.  Equipment Recommendations  None recommended by OT     Recommendations for Other Services       Precautions / Restrictions Precautions Precautions: Fall Restrictions Weight Bearing Restrictions: No      Mobility Bed Mobility Overal bed mobility: Modified Independent                  Transfers Overall transfer level: Needs assistance Equipment used: Rolling walker (2 wheels) Transfers: Sit to/from Stand Sit to Stand: Min guard           General transfer comment: min guard for safety      Balance Overall balance assessment: Needs assistance Sitting-balance support: Feet supported Sitting balance-Leahy Scale: Good     Standing balance support: Single extremity supported;During functional activity Standing balance-Leahy Scale: Fair                             ADL either performed or assessed with clinical judgement   ADL Overall ADL's : Needs assistance/impaired Eating/Feeding: Independent;Sitting   Grooming: Min guard;Standing   Upper Body Bathing: Supervision/ safety;Sitting   Lower Body Bathing: Min guard;Sit to/from stand   Upper Body Dressing : Supervision/safety;Sitting   Lower Body Dressing: Min guard;Sit to/from stand   Toilet Transfer: Min guard;Rolling walker (2 wheels);Ambulation   Toileting- Clothing Manipulation and Hygiene: Supervision/safety;Sitting/lateral lean       Functional mobility during ADLs: Min guard;Rolling walker (2 wheels) General ADL Comments: assistance levels for safety only this session, no physical assist required. pt did not have vision symptoms during gross motor/ ADL tasks.     Vision Baseline Vision/History: 1 Wears glasses Patient Visual Report: Diplopia;Blurring of  vision;Other (comment) Vision Assessment?: Yes;Vision impaired- to be further tested in functional context Eye Alignment: Within Functional Limits Ocular Range of Motion: Restricted on the left Alignment/Gaze Preference: Within Defined Limits Tracking/Visual Pursuits: Decreased  smoothness of horizontal tracking Saccades: Within functional limits Visual Fields: Impaired-to be further tested in functional context Diplopia Assessment: Disappears with one eye closed;Other (comment) Additional Comments: pt states her double vision/blurry visision comes and goes. She described her vision as "jumping" around, or "switching." pt also stated that her L eye has been a "laxy eye" since she was a kid, she cannot see well with that eye alone.     Perception     Praxis      Pertinent Vitals/Pain Pain Assessment: No/denies pain     Hand Dominance Right   Extremity/Trunk Assessment Upper Extremity Assessment Upper Extremity Assessment: Generalized weakness   Lower Extremity Assessment Lower Extremity Assessment: Defer to PT evaluation   Cervical / Trunk Assessment Cervical / Trunk Assessment: Kyphotic (mild)   Communication Communication Communication: No difficulties   Cognition Arousal/Alertness: Awake/alert Behavior During Therapy: WFL for tasks assessed/performed Overall Cognitive Status: Within Functional Limits for tasks assessed                                 General Comments: difficulty verbalizing vision changes/symptoms     General Comments  VSS on RA    Exercises     Shoulder Instructions      Home Living Family/patient expects to be discharged to:: Private residence Living Arrangements: Alone Available Help at Discharge: Family;Available PRN/intermittently Type of Home: Independent living facility Home Access: Level entry     Home Layout: One level     Bathroom Shower/Tub: Occupational psychologist: Handicapped height     Home Equipment: Rollator (4 wheels);Rolling Walker (2 wheels);BSC/3in1   Additional Comments: ILF at [facility]      Prior Functioning/Environment Prior Level of Function : Independent/Modified Independent             Mobility Comments: uses rollator or RW ADLs Comments: indep;  walks to cafeteria/resturants at [employer] for meals        OT Problem List: Impaired balance (sitting and/or standing);Impaired vision/perception;Decreased knowledge of precautions      OT Treatment/Interventions: Self-care/ADL training;Therapeutic exercise;Balance training;Patient/family education;DME and/or AE instruction    OT Goals(Current goals can be found in the care plan section) Acute Rehab OT Goals Patient Stated Goal: home today OT Goal Formulation: With patient Time For Goal Achievement: 05/25/21 Potential to Achieve Goals: Fair ADL Goals Additional ADL Goal #1: Pt will independently verbalize the progression of L eye glasses occlusion to ensure safe d/c home Additional ADL Goal #2: Pt will complete all BADLs at a mod I level  OT Frequency: Min 2X/week   Barriers to D/C: Decreased caregiver support  pt lives alone       Co-evaluation              AM-PAC OT "6 Clicks" Daily Activity     Outcome Measure Help from another person eating meals?: None Help from another person taking care of personal grooming?: A Little Help from another person toileting, which includes using toliet, bedpan, or urinal?: A Little Help from another person bathing (including washing, rinsing, drying)?: A Little Help from another person to put on and taking off regular upper body clothing?: A Little Help from another person to put on and taking off regular  lower body clothing?: A Little 6 Click Score: 19   End of Session Equipment Utilized During Treatment: Gait belt;Rolling walker (2 wheels) Nurse Communication: Mobility status (glass occulsion)  Activity Tolerance: Patient tolerated treatment well Patient left: in chair;with call bell/phone within reach  OT Visit Diagnosis: Low vision, both eyes (H54.2);Pain                Time: 3672-5500 OT Time Calculation (min): 39 min Charges:  OT General Charges $OT Visit: 1 Visit OT Evaluation $OT Eval Moderate Complexity: 1 Mod OT  Treatments $Self Care/Home Management : 23-37 mins  Ketzia Guzek A Nathanal Hermiz 05/11/2021, 11:44 AM

## 2021-05-11 NOTE — Progress Notes (Signed)
  Echocardiogram 2D Echocardiogram has been performed.  Jessica Mullins 05/11/2021, 2:15 PM

## 2021-05-11 NOTE — Progress Notes (Signed)
This encounter was created in error - please disregard.

## 2021-05-12 ENCOUNTER — Telehealth: Payer: Self-pay | Admitting: *Deleted

## 2021-05-12 DIAGNOSIS — H532 Diplopia: Secondary | ICD-10-CM | POA: Diagnosis not present

## 2021-05-12 NOTE — Telephone Encounter (Signed)
Transition Care Management Follow-up Telephone Call Date of discharge and from where: 05/11/2021 Jessica Mullins How have you been since you were released from the hospital? Weak but getting better. Any questions or concerns? No  Items Reviewed: Did the pt receive and understand the discharge instructions provided? Yes  Medications obtained and verified? Yes  Other? No  Any new allergies since your discharge? No  Dietary orders reviewed? Yes Do you have support at home? Yes   Home Care and Equipment/Supplies: Were home health services ordered? Patient stated that she wasn't sure, stated that she had a lot of paperwork that was given to [place].  If so, what is the name of the agency?  Has the agency set up a time to come to the patient's home? not applicable Were any new equipment or medical supplies ordered?  No What is the name of the medical supply agency? na Were you able to get the supplies/equipment? not applicable Do you have any questions related to the use of the equipment or supplies? No  Functional Questionnaire: (I = Independent and D = Dependent) ADLs: I  Bathing/Dressing- I  Meal Prep- D  Eating- I  Maintaining continence- I  Transferring/Ambulation- I  Managing Meds- I  Follow up appointments reviewed:  PCP Hospital f/u appt confirmed? Yes  Scheduled to see Amy on 05/13/2021 @ 2:30. [facility] f/u appt confirmed? No   Are transportation arrangements needed? No [facility] going to bring If their condition worsens, is the pt aware to call PCP or go to the Emergency Dept.? Yes Was the patient provided with contact information for the PCP's office or ED? Yes Was to pt encouraged to call back with questions or concerns? Yes

## 2021-05-13 ENCOUNTER — Encounter: Payer: Self-pay | Admitting: Orthopedic Surgery

## 2021-05-13 ENCOUNTER — Other Ambulatory Visit: Payer: Self-pay

## 2021-05-13 ENCOUNTER — Ambulatory Visit: Payer: Medicare PPO | Admitting: Orthopedic Surgery

## 2021-05-13 VITALS — BP 130/70 | HR 72 | Temp 97.3°F | Ht 61.0 in | Wt 153.8 lb

## 2021-05-13 DIAGNOSIS — I480 Paroxysmal atrial fibrillation: Secondary | ICD-10-CM

## 2021-05-13 DIAGNOSIS — Z8673 Personal history of transient ischemic attack (TIA), and cerebral infarction without residual deficits: Secondary | ICD-10-CM

## 2021-05-13 DIAGNOSIS — I10 Essential (primary) hypertension: Secondary | ICD-10-CM | POA: Diagnosis not present

## 2021-05-13 DIAGNOSIS — D329 Benign neoplasm of meninges, unspecified: Secondary | ICD-10-CM | POA: Diagnosis not present

## 2021-05-13 DIAGNOSIS — R630 Anorexia: Secondary | ICD-10-CM

## 2021-05-13 DIAGNOSIS — E7800 Pure hypercholesterolemia, unspecified: Secondary | ICD-10-CM | POA: Diagnosis not present

## 2021-05-13 DIAGNOSIS — R911 Solitary pulmonary nodule: Secondary | ICD-10-CM | POA: Diagnosis not present

## 2021-05-13 NOTE — Progress Notes (Signed)
Careteam: Patient Care Team: Virgie Dad, MD as PCP - General (Internal Medicine) Croitoru, Dani Gobble, MD as PCP - Cardiology (Cardiology) Allyn Kenner, MD as Consulting Physician (Dermatology) Calvert Cantor, MD as Consulting Physician (Ophthalmology) Pieter Partridge, DO as Consulting Physician (Neurology) Delice Bison, Charlestine Massed, NP as Nurse Practitioner (Hematology and Oncology)  Seen by: [name], AGNP-C  PLACE OF SERVICE:  [place]  Advanced Directive information    Allergies  Allergen Reactions   Sulfa Antibiotics Other (See Comments)    As a child, rigid as a stick and not responsive   Tape Other (See Comments)    Blisters, Please use "paper" tape only for short periods of time   Glucosamine Forte [Nutritional Supplements] Rash   Latex Rash    No chief complaint on file.    HPI: Patient is a 87 y.o. female seen today for f/u s/p hospitalization 11/14-11/15 at [facility].   11/14 she woke up seeing double with right sided vision loss and presented to ED. In the weeks prior to event she noticed she was word searching. No history of stroke in past. She was diagnosed with acute CVA. MRI brain revealed punctate area of restricted diffusion to anterior left parietal lobe cortex, consistent with infarct. She was advised to continue Eliquis and statin. Aspirin also recommended by neurology, but patient did not want to add additional medication. Stroke team did not think vision loss was related to stroke. Advised to follow up with ophthalmology in future. Meningioma also noted on MRI, advised to follow up with neurosurgery. 51mm left upper lung nodule noted on CT. She is scheduled to see pulmonary 11/29. Does not want f/u CXR today. Would like to wait and see pulmonary.   She saw Dr.Lyle yesterday. Denies loss of vision at this time. Advised to call office if right vision loss occurs again.   Does not plan to follow up with neurosurgery for meningioma due to goal of care.    Last fall 6 months ago, ambulates with rolator.   Reports poor appetite. Tries to eat at least one meal, sometimes has to force herself to eat. Does not use supplemental shakes.     Review of Systems:  Review of Systems  Constitutional:  Negative for chills, fever, malaise/fatigue and weight loss.  HENT:  Negative for hearing loss and sore throat.   Eyes:  Negative for blurred vision and double vision.  Respiratory:  Negative for cough, shortness of breath and wheezing.   Cardiovascular:  Negative for chest pain and leg swelling.  Gastrointestinal:  Positive for constipation and diarrhea. Negative for abdominal pain, blood in stool, heartburn, nausea and vomiting.  Genitourinary:  Negative for dysuria, frequency and hematuria.  Musculoskeletal:  Positive for joint pain and myalgias. Negative for falls.  Skin: Negative.   Neurological:  Positive for dizziness. Negative for weakness and headaches.  Psychiatric/Behavioral:  Negative for depression. The patient is not nervous/anxious.    Past Medical History:  Diagnosis Date   Allergy    Ankle fracture, left    Atrial fibrillation ([place])    Per [facility] New Patient Packet    Breast cancer ([place]) 1115/16   left    Bursitis of right shoulder    Cataract    Family history of cancer    Fracture of right wrist    GERD (gastroesophageal reflux disease)    Hyperlipidemia    Hypertension    Lumbar radiculitis    Per [facility] New Patient Packet  Meningioma [facility])    Per [facility] New Patient Packet    Skin cancer 2000   melanoma and basal cell   Stress fracture    Right Heel, Per [facility] New Patient Packet    Vaso vagal episode    during preparation for colonoscopy   Past Surgical History:  Procedure Laterality Date   ABDOMINAL HYSTERECTOMY  1988   TAH/BSO--FIBROIDS   APPENDECTOMY     BREAST LUMPECTOMY     B/L--FCS   BREAST LUMPECTOMY WITH RADIOACTIVE SEED AND SENTINEL LYMPH NODE BIOPSY Left 06/11/2015   Procedure: LEFT BREAST LUMPECTOMY WITH  RADIOACTIVE SEED AND LEFT SENTINEL LYMPH NODE MAPPING;  Surgeon: Erroll Luna, MD;  Location: [place];  Service: General;  Laterality: Left;   CATARACT EXTRACTION  2015   COLONOSCOPY  2010   DG  BONE DENSITY ([place] HX)     EYE SURGERY Bilateral    cataracts   fiberadenoma Bilateral 1978, 1980   GANGLION CYST EXCISION     L  hand   Lipiflow procedure     Social History:   reports that she has never smoked. She has never used smokeless tobacco. She reports current alcohol use. She reports that she does not use drugs.  Family History  Problem Relation Age of Onset   Cancer Mother        mets to bone--? primary   Coronary artery disease Brother        died of M! @ 30   Diabetes Brother    Hyperlipidemia Brother    Hypertension Brother    Heart disease Brother        chf   Heart disease Father 39       MI   Lung cancer Sister 34       former smoker   Diabetes Maternal Aunt    Lung cancer Other    Thrombosis Other        thromboembolism clotting disorder--? father died of ? clot    Hypertension Son    High Cholesterol Son     Medications: Patient's Medications  New Prescriptions   No medications on file  Previous Medications   AMBULATORY NON FORMULARY MEDICATION    Walker use daily as directed.  Disp 1 Leg pain M79.604   BIOTIN 5 MG CAPS    Take 5 mg by mouth daily.    CALCIUM CARBONATE-VITAMIN D 600-400 MG-UNIT TABLET    Take 1 tablet by mouth daily.   CHOLECALCIFEROL (VITAMIN D3) 1000 UNITS CAPS    Take 1,000 Units by mouth daily.   COVID-19 MRNA VAC-TRIS, PFIZER, (PFIZER-BIONT COVID-19 VAC-TRIS) SUSP INJECTION    Inject into the muscle.   CYCLOSPORINE (RESTASIS) 0.05 % OPHTHALMIC EMULSION    Place 1 drop into both eyes 2 (two) times daily.   DILTIAZEM (CARDIZEM) 30 MG TABLET    TAKE 1 TABLET BY MOUTH TWICE DAILY.   DIPHENHYDRAMINE-ACETAMINOPHEN (TYLENOL PM) 25-500 MG TABS TABLET    Take 2 tablets by mouth at bedtime as needed (pain).   ELIQUIS 5 MG TABS TABLET    TAKE  1 TABLET BY MOUTH TWICE DAILY.   FLECAINIDE (TAMBOCOR) 50 MG TABLET    Take 1 tablet (50 mg total) by mouth 2 (two) times daily.   KETOCONAZOLE (NIZORAL) 2 % CREAM    APPLY TO AFFECTED AREA ONCE DAILY AS NEEDED FOR ITCHING.   MULTIPLE VITAMINS-MINERALS (PRESERVISION AREDS) TABS    Take 2 tablets by mouth daily.   OMEGA-3 FATTY ACIDS (FISH OIL  PO)    Take 1 tablet by mouth daily.    POLYETHYLENE GLYCOL (MIRALAX / GLYCOLAX) 17 G PACKET    Take 17 g by mouth daily.   PRAVASTATIN (PRAVACHOL) 40 MG TABLET    TAKE 1 TABLET ONCE DAILY.   PSYLLIUM (METAMUCIL PO)    Take 1 Dose by mouth daily. 1 teaspoon   TURMERIC 500 MG CAPS    Take 500 capsules by mouth daily.   VITAMIN B-12 (CYANOCOBALAMIN) 1000 MCG TABLET    Take 1 tablet (1,000 mcg total) by mouth daily.  Modified Medications   No medications on file  Discontinued Medications   No medications on file    Physical Exam:  There were no vitals filed for this visit. There is no height or weight on file to calculate BMI. Wt Readings from Last 3 Encounters:  05/10/21 160 lb (72.6 kg)  04/06/21 159 lb (72.1 kg)  02/17/21 158 lb 6.4 oz (71.8 kg)    Physical Exam Vitals reviewed.  Constitutional:      General: She is not in acute distress. HENT:     Head: Normocephalic.  Eyes:     General:        Right eye: No discharge.        Left eye: No discharge.  Neck:     Vascular: No carotid bruit.  Cardiovascular:     Rate and Rhythm: Normal rate. Rhythm irregular.     Pulses: Normal pulses.     Heart sounds: Normal heart sounds. No murmur heard. Pulmonary:     Effort: Pulmonary effort is normal. No respiratory distress.     Breath sounds: Normal breath sounds. No wheezing.  Abdominal:     General: Bowel sounds are normal. There is no distension.     Palpations: Abdomen is soft.     Tenderness: There is no abdominal tenderness.  Musculoskeletal:     Cervical back: Normal range of motion.     Right lower leg: No edema.     Left lower  leg: No edema.  Lymphadenopathy:     Cervical: No cervical adenopathy.  Skin:    General: Skin is warm and dry.     Capillary Refill: Capillary refill takes less than 2 seconds.     Comments: Scattered bruising to BUE   Neurological:     General: No focal deficit present.     Mental Status: She is alert and oriented to person, place, and time.     Motor: Weakness present.     Gait: Gait abnormal.     Comments: Rolator  Psychiatric:        Mood and Affect: Mood normal.        Behavior: Behavior normal.    Labs reviewed: Basic Metabolic Panel: Recent Labs    07/16/20 0000 02/11/21 0000 05/10/21 0824  NA 142 143 140  K 4.4 4.4 3.9  CL 103 105 104  CO2 31* 26* 28  GLUCOSE  --   --  92  BUN 15 17 13   CREATININE 0.7 0.7 0.76  CALCIUM 10.4 9.8 9.8  TSH  --  2.99  --    Liver Function Tests: Recent Labs    07/16/20 0000 02/11/21 0000 05/10/21 0824  AST 19 19 19   ALT 19 17 13   ALKPHOS  --  60 46  BILITOT  --   --  0.7  PROT  --   --  6.4*  ALBUMIN 4.4 4.4 4.3   No results for  input(s): LIPASE, AMYLASE in the last 8760 hours. No results for input(s): AMMONIA in the last 8760 hours. CBC: Recent Labs    07/16/20 0000 02/11/21 0000 05/10/21 0824  WBC 6.1 5.4 7.7  NEUTROABS  --   --  5.4  HGB 15.5 15.5 15.3*  HCT  --  45 45.4  MCV  --   --  92.1  PLT  --  210 193   Lipid Panel: Recent Labs    07/16/20 0000 02/11/21 0000 05/11/21 0128  CHOL 152 156 146  HDL 74* 66 59  LDLCALC 65 75 70  TRIG 69 73 86  CHOLHDL  --   --  2.5   TSH: Recent Labs    02/11/21 0000  TSH 2.99   A1C: Lab Results  Component Value Date   HGBA1C 4.8 05/11/2021     Assessment/Plan 1. Left upper lobe pulmonary nodule - 6 mm nodule noted - asymptomatic - does not want CXR until pulmonary f/u- scheduled 11/29 - CBC with Differential/Platelet; Future - Basic Metabolic Panel; Future   2. History of CVA (cerebrovascular accident) - hospitalized 11/14-11/15- symptoms  right eye vision loss- no weakness or facial drooping - MRI brain revealed punctate area of restricted diffusion to anterior left parietal lobe cortex, consistent with infarct - exam unremarkable today - advised to f/u with Dr. Tyrone Schimke if vision loss occurs - cont Eliquis and statin - refused to start aspirin - CBC with Differential/Platelet; Future - Basic Metabolic Panel; Future  3. Meningioma ([place]) - advised to f/u with neurosurgery- not surgical candidate - does not want aggressive treatment or follow ups  4. Paroxysmal atrial fibrillation (HCC) - followed by cardiology - rate controlled with cardizem and flecainide - cont Eliquis for clot prevention  5. Essential hypertension - controlled - cont cardizem  6. Pure hypercholesterolemia - LDL 70 05/11/2021 - cont statin  7. Poor appetite - 6 lb weight loss in past 5 months - recommend eating > 1500 calories/day - recommend ensure shakes  Total time: 35 minutes. Greater than 50% of total time spent doing patient education on symptom management, goals of care and appetite.   Labs/procedures: f/u labs to be done at [facility] next week   Next appt: Visit date not found  [place], [facility] [phone]

## 2021-05-13 NOTE — Patient Instructions (Signed)
Continue follow up with Pulmonary and Cardiology.   Need lab work in one week- may do at [employer] [facility] follow up with Alyse Low in a few weeks  Try to increase eating- may try ensure shakes

## 2021-05-17 ENCOUNTER — Other Ambulatory Visit: Payer: Self-pay | Admitting: Internal Medicine

## 2021-05-17 DIAGNOSIS — E785 Hyperlipidemia, unspecified: Secondary | ICD-10-CM

## 2021-05-18 DIAGNOSIS — E7800 Pure hypercholesterolemia, unspecified: Secondary | ICD-10-CM | POA: Diagnosis not present

## 2021-05-18 DIAGNOSIS — I10 Essential (primary) hypertension: Secondary | ICD-10-CM | POA: Diagnosis not present

## 2021-05-18 DIAGNOSIS — I480 Paroxysmal atrial fibrillation: Secondary | ICD-10-CM | POA: Diagnosis not present

## 2021-05-18 DIAGNOSIS — D519 Vitamin B12 deficiency anemia, unspecified: Secondary | ICD-10-CM | POA: Diagnosis not present

## 2021-05-18 LAB — BASIC METABOLIC PANEL
BUN: 16 (ref 4–21)
CO2: 27 — AB (ref 13–22)
Chloride: 105 (ref 99–108)
Creatinine: 0.7 (ref 0.5–1.1)
Glucose: 102
Potassium: 4 (ref 3.4–5.3)
Sodium: 142 (ref 137–147)

## 2021-05-18 LAB — COMPREHENSIVE METABOLIC PANEL: Calcium: 10.3 (ref 8.7–10.7)

## 2021-05-18 LAB — CBC AND DIFFERENTIAL
HCT: 48 — AB (ref 36–46)
Hemoglobin: 15.2 (ref 12.0–16.0)
Platelets: 210 (ref 150–399)
WBC: 6.5

## 2021-05-18 LAB — CBC: RBC: 5.08 (ref 3.87–5.11)

## 2021-05-25 ENCOUNTER — Other Ambulatory Visit: Payer: Self-pay

## 2021-05-25 ENCOUNTER — Encounter: Payer: Self-pay | Admitting: Pulmonary Disease

## 2021-05-25 ENCOUNTER — Ambulatory Visit (INDEPENDENT_AMBULATORY_CARE_PROVIDER_SITE_OTHER): Payer: Medicare PPO | Admitting: Pulmonary Disease

## 2021-05-25 VITALS — BP 132/72 | HR 74 | Temp 98.1°F | Ht 61.0 in | Wt 153.2 lb

## 2021-05-25 DIAGNOSIS — R911 Solitary pulmonary nodule: Secondary | ICD-10-CM

## 2021-05-25 NOTE — Progress Notes (Signed)
Jessica Mullins    [id number]    [birth year]  Primary Care Physician:Gupta, Rene Kocher, MD  Referring Physician: Virgie Dad, MD [address] [place],  [place] [address]  Chief complaint: Consult for lung nodule  HPI: 87 year old with history of left breast cancer, atrial fibrillation on Eliquis, hypertension, hyperlipidemia, meningioma.  She had an admission in November 2022 for vision changes, acute stroke.  Neurology was consulted.  She had head and neck which showed incidental findings of left upper lobe and was referred here for further evaluation  At present she denies any respiratory issues.  No cough, sputum production, wheezing or hemoptysis.  She is a non-smoker with no exposure history   Outpatient Encounter Medications as of 05/25/2021  Medication Sig   AMBULATORY NON FORMULARY MEDICATION Walker use daily as directed.  Disp 1 Leg pain M79.604   Biotin 5 MG CAPS Take 5 mg by mouth daily.    Calcium Carbonate-Vitamin D 600-400 MG-UNIT tablet Take 1 tablet by mouth daily.   Cholecalciferol (VITAMIN D3) 1000 UNITS CAPS Take 1,000 Units by mouth daily.   cycloSPORINE (RESTASIS) 0.05 % ophthalmic emulsion Place 1 drop into both eyes 2 (two) times daily.   diltiazem (CARDIZEM) 30 MG tablet TAKE 1 TABLET BY MOUTH TWICE DAILY. (Patient taking differently: Take 30 mg by mouth 2 (two) times daily.)   diphenhydramine-acetaminophen (TYLENOL PM) 25-500 MG TABS tablet Take 2 tablets by mouth at bedtime as needed (pain).   ELIQUIS 5 MG TABS tablet TAKE 1 TABLET BY MOUTH TWICE DAILY. (Patient taking differently: Take 5 mg by mouth 2 (two) times daily.)   flecainide (TAMBOCOR) 50 MG tablet Take 1 tablet (50 mg total) by mouth 2 (two) times daily.   ketoconazole (NIZORAL) 2 % cream APPLY TO AFFECTED AREA ONCE DAILY AS NEEDED FOR ITCHING. (Patient taking differently: Apply 1 application topically daily as needed for irritation.)   Multiple Vitamins-Minerals (PRESERVISION AREDS)  TABS Take 2 tablets by mouth daily.   Omega-3 Fatty Acids (FISH OIL PO) Take 1 tablet by mouth daily.    polyethylene glycol (MIRALAX / GLYCOLAX) 17 g packet Take 17 g by mouth daily.   pravastatin (PRAVACHOL) 40 MG tablet TAKE ONE TABLET BY MOUTH ONCE DAILY   Psyllium (METAMUCIL PO) Take 1 Dose by mouth daily. 1 teaspoon   Turmeric 500 MG CAPS Take 500 capsules by mouth daily.   vitamin B-12 (CYANOCOBALAMIN) 1000 MCG tablet Take 1 tablet (1,000 mcg total) by mouth daily.   [DISCONTINUED] COVID-19 mRNA Vac-TriS, Pfizer, (PFIZER-BIONT COVID-19 VAC-TRIS) SUSP injection Inject into the muscle.   No facility-administered encounter medications on file as of 05/25/2021.    Allergies as of 05/25/2021 - Review Complete 05/25/2021  Allergen Reaction Noted   Sulfa antibiotics Other (See Comments) 09/25/2012   Tape Other (See Comments) 06/01/2015   Glucosamine forte [nutritional supplements] Rash 06/01/2015   Latex Rash 06/09/2015    Past Medical History:  Diagnosis Date   Allergy    Ankle fracture, left    Atrial fibrillation ([place])    Per [facility] New Patient Packet    Breast cancer ([place]) 1115/16   left    Bursitis of right shoulder    Cataract    Family history of cancer    Fracture of right wrist    GERD (gastroesophageal reflux disease)    Hyperlipidemia    Hypertension    Lumbar radiculitis    Per [facility] New Patient Packet  Meningioma [facility])    Per [facility] New Patient Packet    Skin cancer 2000   melanoma and basal cell   Stress fracture    Right Heel, Per [facility] New Patient Packet    Vaso vagal episode    during preparation for colonoscopy    Past Surgical History:  Procedure Laterality Date   ABDOMINAL HYSTERECTOMY  1988   TAH/BSO--FIBROIDS   APPENDECTOMY     BREAST LUMPECTOMY     B/L--FCS   BREAST LUMPECTOMY WITH RADIOACTIVE SEED AND SENTINEL LYMPH NODE BIOPSY Left 06/11/2015   Procedure: LEFT BREAST LUMPECTOMY WITH RADIOACTIVE SEED AND LEFT SENTINEL LYMPH NODE MAPPING;   Surgeon: Erroll Luna, MD;  Location: [place];  Service: General;  Laterality: Left;   CATARACT EXTRACTION  2015   COLONOSCOPY  2010   DG  BONE DENSITY ([place] HX)     EYE SURGERY Bilateral    cataracts   fiberadenoma Bilateral 1978, 1980   GANGLION CYST EXCISION     L  hand   Lipiflow procedure      Family History  Problem Relation Age of Onset   Cancer Mother        mets to bone--? primary   Coronary artery disease Brother        died of M! @ 32   Diabetes Brother    Hyperlipidemia Brother    Hypertension Brother    Heart disease Brother        chf   Heart disease Father 58       MI   Lung cancer Sister 71       former smoker   Diabetes Maternal Aunt    Lung cancer Other    Thrombosis Other        thromboembolism clotting disorder--? father died of ? clot    Hypertension Son    High Cholesterol Son     Social History   Socioeconomic History   Marital status: Widowed    Spouse name: Not on file   Number of children: 2   Years of education: Not on file   Highest education level: Master's degree (e.g., MA, MS, MEng, MEd, [name], [name])  Occupational History   Occupation: Product manager: RETIRED    Comment: retired  Tobacco Use   Smoking status: Never   Smokeless tobacco: Never  Vaping Use   Vaping Use: Never used  Substance and Sexual Activity   Alcohol use: Yes    Comment: rare   Drug use: No   Sexual activity: Never  Other Topics Concern   Not on file  Social History Narrative   Patient is widowed.  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    One son one daughter   1 caffeinated beverage daily   She is right-handed.    She works out everyday to a televised [employer] 30 minute program.   No tobacco or alcohol         Per [facility] New Patient Packet Abstracted on 01/03/2020      Diet: Left blank       Caffeine: Yes      Married, if yes what year: Widowed, married in [place] you live in a house, apartment, assisted living, condo, trailer,  ect: Apartment      Is it one or more stories: One stories, one person       Pets: 1 Public relations account executive profession:  Teacher      Highest level or education completed: MA of Education       Exercise:   Yes             Type and how often: Cardio, chair aerobics, 3-4 times weekly          Living Will: Yes   DNR: Yes   POA/HPOA: Yes      Functional Status:   Do you have difficulty bathing or dressing yourself? Left blank   Do you have difficulty preparing food or eating? Left blank   Do you have difficulty managing your medications? Left blank   Do you have difficulty managing your finances? Left blank   Do you have difficulty affording your medications? Left blank   Social Determinants of Health   Financial Resource Strain: Not on file  Food Insecurity: Not on file  Transportation Needs: Not on file  Physical Activity: Not on file  Stress: Not on file  Social Connections: Not on file  Intimate Partner Violence: Not on file    Review of systems: Review of Systems  Constitutional: Negative for fever and chills.  HENT: Negative.   Eyes: Negative for blurred vision.  Respiratory: as per HPI  Cardiovascular: Negative for chest pain and palpitations.  Gastrointestinal: Negative for vomiting, diarrhea, blood per rectum. Genitourinary: Negative for dysuria, urgency, frequency and hematuria.  Musculoskeletal: Negative for myalgias, back pain and joint pain.  Skin: Negative for itching and rash.  Neurological: Negative for dizziness, tremors, focal weakness, seizures and loss of consciousness.  Endo/Heme/Allergies: Negative for environmental allergies.  Psychiatric/Behavioral: Negative for depression, suicidal ideas and hallucinations.  All other systems reviewed and are negative.  Physical Exam: Blood pressure 132/72, pulse 74, temperature 98.1 F (36.7 C), temperature source Oral, height 5\' 1"  (1.549 m), weight 153 lb 3.2 oz (69.5 kg), SpO2 97 %. Gen:      No acute  distress HEENT:  EOMI, sclera anicteric Neck:     No masses; no thyromegaly Lungs:    Clear to auscultation bilaterally; normal respiratory effort CV:         Regular rate and rhythm; no murmurs Abd:      + bowel sounds; soft, non-tender; no palpable masses, no distension Ext:    No edema; adequate peripheral perfusion Skin:      Warm and dry; no rash Neuro: alert and oriented x 3 Psych: normal mood and affect  Data Reviewed: Imaging: CT cervical spine 09/10/2020-5 mm left upper lobe nodule CT head and neck 05/10/2021- 6 mm left upper lobe nodule I have reviewed the images personally.  PFTs:  Labs:  Assessment:  Lung nodule This was found incidentally during recent admission for stroke, vision changes She had prior CT scan in March 2022 which showed similar size of lung nodule without growth  This is likely benign as she is a non-smoker with no risk factors We will get a dedicated CT chest without contrast for better evaluation  Plan/Recommendations: CT chest without contrast  Marshell Garfinkel MD [facility] 05/25/2021, 10:52 AM  CC: Virgie Dad, MD

## 2021-05-25 NOTE — Progress Notes (Deleted)
Jessica Mullins    [id number]    Jan 02, 1931  Primary Care Physician:Gupta, Rene Kocher, MD  Referring Physician: Virgie Dad, MD [place],  [place] [id number]  Chief complaint:  ***  HPI:  ***  Pets: Occupation: Exposures: Smoking history: Travel history: Relevant family history:   Outpatient Encounter Medications as of 05/25/2021  Medication Sig   AMBULATORY NON FORMULARY MEDICATION Walker use daily as directed.  Disp 1 Leg pain M79.604   Biotin 5 MG CAPS Take 5 mg by mouth daily.    Calcium Carbonate-Vitamin D 600-400 MG-UNIT tablet Take 1 tablet by mouth daily.   Cholecalciferol (VITAMIN D3) 1000 UNITS CAPS Take 1,000 Units by mouth daily.   cycloSPORINE (RESTASIS) 0.05 % ophthalmic emulsion Place 1 drop into both eyes 2 (two) times daily.   diltiazem (CARDIZEM) 30 MG tablet TAKE 1 TABLET BY MOUTH TWICE DAILY. (Patient taking differently: Take 30 mg by mouth 2 (two) times daily.)   diphenhydramine-acetaminophen (TYLENOL PM) 25-500 MG TABS tablet Take 2 tablets by mouth at bedtime as needed (pain).   ELIQUIS 5 MG TABS tablet TAKE 1 TABLET BY MOUTH TWICE DAILY. (Patient taking differently: Take 5 mg by mouth 2 (two) times daily.)   flecainide (TAMBOCOR) 50 MG tablet Take 1 tablet (50 mg total) by mouth 2 (two) times daily.   ketoconazole (NIZORAL) 2 % cream APPLY TO AFFECTED AREA ONCE DAILY AS NEEDED FOR ITCHING. (Patient taking differently: Apply 1 application topically daily as needed for irritation.)   Multiple Vitamins-Minerals (PRESERVISION AREDS) TABS Take 2 tablets by mouth daily.   Omega-3 Fatty Acids (FISH OIL PO) Take 1 tablet by mouth daily.    polyethylene glycol (MIRALAX / GLYCOLAX) 17 g packet Take 17 g by mouth daily.   pravastatin (PRAVACHOL) 40 MG tablet TAKE ONE TABLET BY MOUTH ONCE DAILY   Psyllium (METAMUCIL PO) Take 1 Dose by mouth daily. 1 teaspoon   Turmeric 500 MG CAPS Take 500 capsules by mouth daily.   vitamin B-12  (CYANOCOBALAMIN) 1000 MCG tablet Take 1 tablet (1,000 mcg total) by mouth daily.   [DISCONTINUED] COVID-19 mRNA Vac-TriS, Pfizer, (PFIZER-BIONT COVID-19 VAC-TRIS) SUSP injection Inject into the muscle.   No facility-administered encounter medications on file as of 05/25/2021.    Allergies as of 05/25/2021 - Review Complete 05/25/2021  Allergen Reaction Noted   Sulfa antibiotics Other (See Comments) 09/25/2012   Tape Other (See Comments) 06/01/2015   Glucosamine forte [nutritional supplements] Rash 06/01/2015   Latex Rash 06/09/2015    Past Medical History:  Diagnosis Date   Allergy    Ankle fracture, left    Atrial fibrillation ([place])    Per [place] [facility] Patient Packet    Breast cancer ([place]) 1115/16   left    Bursitis of right shoulder    Cataract    Family history of cancer    Fracture of right wrist    GERD (gastroesophageal reflux disease)    Hyperlipidemia    Hypertension    Lumbar radiculitis    Per [place] [facility] Patient Packet    Meningioma [facility])    Per [place] New Patient Packet    Skin cancer 2000   melanoma and basal cell   Stress fracture    Right Heel, Per [place] [facility] Patient Packet    Vaso vagal episode    during preparation for colonoscopy    Past Surgical History:  Procedure Laterality Date   [place]  TAH/BSO--FIBROIDS   APPENDECTOMY     BREAST LUMPECTOMY     B/L--FCS   BREAST LUMPECTOMY WITH RADIOACTIVE SEED AND SENTINEL LYMPH NODE BIOPSY Left 06/11/2015   Procedure: LEFT BREAST LUMPECTOMY WITH RADIOACTIVE SEED AND LEFT SENTINEL LYMPH NODE MAPPING;  Surgeon: Erroll Luna, MD;  Location: [place];  Service: General;  Laterality: Left;   CATARACT EXTRACTION  2015   COLONOSCOPY  2010   DG  BONE DENSITY ([place] [facility])     EYE SURGERY Bilateral    cataracts   fiberadenoma Bilateral 1978, 1980   GANGLION CYST EXCISION     L  hand   Lipiflow procedure      Family History  Problem Relation Age of Onset   Cancer Mother        mets to bone--? primary    Coronary artery disease Brother        died of M! @ 37   Diabetes Brother    Hyperlipidemia Brother    Hypertension Brother    Heart disease Brother        chf   Heart disease Father 14       MI   Lung cancer Sister 73       former smoker   Diabetes Maternal Aunt    Lung cancer Other    Thrombosis Other        thromboembolism clotting disorder--? father died of ? clot    Hypertension Son    High Cholesterol Son     Social History   Socioeconomic History   Marital status: Widowed    Spouse name: Not on file   Number of children: 2   Years of education: Not on file   Highest education level: Master's degree (e.g., MA, MS, MEng, MEd, [name], [name])  Occupational History   Occupation: Product manager: RETIRED    Comment: retired  Tobacco Use   Smoking status: Never   Smokeless tobacco: Never  Vaping Use   Vaping Use: Never used  Substance and Sexual Activity   Alcohol use: Yes    Comment: rare   Drug use: No   Sexual activity: Never  Other Topics Concern   Not on file  Social History Narrative   Patient is widowed.  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    One son one daughter   1 caffeinated beverage daily   She is right-handed.    She works out everyday to a televised [employer] 30 minute program.   No tobacco or alcohol         Per [place] [facility] Patient Packet Abstracted on 01/03/2020      Diet: Left blank       Caffeine: Yes      Married, if yes what year: Widowed, married in [place] you live in a house, apartment, assisted living, condo, trailer, ect: Apartment      Is it one or more stories: One stories, one person       Pets: 1 Neurosurgeon      Current/Past profession: Pharmacist, hospital      Highest level or education completed: MA of Education       Exercise:   Yes             Type and how often: Cardio, chair aerobics, 3-4 times weekly          Living Will: Yes   DNR: Yes   POA/HPOA: Yes  Functional Status:   Do you have difficulty bathing  or dressing yourself? Left blank   Do you have difficulty preparing food or eating? Left blank   Do you have difficulty managing your medications? Left blank   Do you have difficulty managing your finances? Left blank   Do you have difficulty affording your medications? Left blank   Social Determinants of Health   Financial Resource Strain: Not on file  Food Insecurity: Not on file  Transportation Needs: Not on file  Physical Activity: Not on file  Stress: Not on file  Social Connections: Not on file  Intimate Partner Violence: Not on file    Review of systems: Review of Systems  Constitutional: Negative for fever and chills.  HENT: Negative.   Eyes: Negative for blurred vision.  Respiratory: as per HPI  Cardiovascular: Negative for chest pain and palpitations.  Gastrointestinal: Negative for vomiting, diarrhea, blood per rectum. Genitourinary: Negative for dysuria, urgency, frequency and hematuria.  Musculoskeletal: Negative for myalgias, back pain and joint pain.  Skin: Negative for itching and rash.  Neurological: Negative for dizziness, tremors, focal weakness, seizures and loss of consciousness.  Endo/Heme/Allergies: Negative for environmental allergies.  Psychiatric/Behavioral: Negative for depression, suicidal ideas and hallucinations.  All other systems reviewed and are negative.  Physical Exam: Blood pressure 132/72, pulse 74, temperature 98.1 F (36.7 C), temperature source Oral, height 5\' 1"  (1.549 m), weight 153 lb 3.2 oz (69.5 kg), SpO2 97 %. Gen:      No acute distress HEENT:  EOMI, sclera anicteric Neck:     No masses; no thyromegaly Lungs:    Clear to auscultation bilaterally; normal respiratory effort*** CV:         Regular rate and rhythm; no murmurs Abd:      + bowel sounds; soft, non-tender; no palpable masses, no distension Ext:    No edema; adequate peripheral perfusion Skin:      Warm and dry; no rash Neuro: alert and oriented x 3 Psych: normal mood  and affect  Data Reviewed: Imaging:  PFTs:  Labs:  Assessment:  ***  Plan/Recommendations: *** This appointment required *** minutes of patient care (this includes precharting, chart review, review of results, face-to-face care, etc.).  Marshell Garfinkel MD [facility] 05/25/2021, 11:03 AM  CC: Virgie Dad, MD

## 2021-05-25 NOTE — Patient Instructions (Signed)
I have reviewed your scan which shows a small lung nodule which appears benign and stable over at least 2 years We will get a dedicated CT chest without contrast for better evaluation Follow-up in 6 months

## 2021-05-26 DIAGNOSIS — M2559 Pain in other specified joint: Secondary | ICD-10-CM | POA: Diagnosis not present

## 2021-05-26 DIAGNOSIS — Z8673 Personal history of transient ischemic attack (TIA), and cerebral infarction without residual deficits: Secondary | ICD-10-CM | POA: Diagnosis not present

## 2021-05-27 ENCOUNTER — Encounter: Payer: Self-pay | Admitting: Pulmonary Disease

## 2021-06-03 ENCOUNTER — Encounter: Payer: Self-pay | Admitting: Cardiovascular Disease

## 2021-06-03 ENCOUNTER — Ambulatory Visit: Payer: Medicare PPO | Admitting: Cardiovascular Disease

## 2021-06-03 ENCOUNTER — Other Ambulatory Visit: Payer: Self-pay

## 2021-06-03 VITALS — BP 108/68 | HR 70 | Resp 20 | Ht 61.0 in | Wt 153.6 lb

## 2021-06-03 DIAGNOSIS — I639 Cerebral infarction, unspecified: Secondary | ICD-10-CM | POA: Diagnosis not present

## 2021-06-03 DIAGNOSIS — I10 Essential (primary) hypertension: Secondary | ICD-10-CM

## 2021-06-03 DIAGNOSIS — Z79899 Other long term (current) drug therapy: Secondary | ICD-10-CM

## 2021-06-03 DIAGNOSIS — Z5181 Encounter for therapeutic drug level monitoring: Secondary | ICD-10-CM

## 2021-06-03 DIAGNOSIS — Z7901 Long term (current) use of anticoagulants: Secondary | ICD-10-CM | POA: Diagnosis not present

## 2021-06-03 DIAGNOSIS — T50905D Adverse effect of unspecified drugs, medicaments and biological substances, subsequent encounter: Secondary | ICD-10-CM | POA: Diagnosis not present

## 2021-06-03 DIAGNOSIS — T50905A Adverse effect of unspecified drugs, medicaments and biological substances, initial encounter: Secondary | ICD-10-CM

## 2021-06-03 DIAGNOSIS — E7800 Pure hypercholesterolemia, unspecified: Secondary | ICD-10-CM

## 2021-06-03 DIAGNOSIS — I471 Supraventricular tachycardia: Secondary | ICD-10-CM

## 2021-06-03 DIAGNOSIS — R001 Bradycardia, unspecified: Secondary | ICD-10-CM

## 2021-06-03 DIAGNOSIS — I480 Paroxysmal atrial fibrillation: Secondary | ICD-10-CM

## 2021-06-03 NOTE — Progress Notes (Signed)
NEUROLOGY FOLLOW UP OFFICE NOTE  Carren L Drzewiecki [id number]  Assessment/Plan:   Intermittent double vision - unclear etiology.  TIA vs MRI-negative infarct possible.  Not explained by punctate infarct on MRI.  Cardiology questioned adding ASA in case she had a TIA secondary to small vessel disease.  In absence of a visualized stroke on MRI that appears consistent with small vessel disease, I wouldn't add an ASA at this time as risk of bleed outweighs potential benefits. Acute punctate left parietal cortical infarct - incidental finding Visual disturbance - semiology sounds very much like migraine aura/ocular migraine.  However she denies prior history of migraines or headaches in general.  Brainstem meningioma - stable Paroxysmal atrial fibrillation on AC Hypertension Hyperlipidemia   Secondary stroke prevention as managed by PCP and cardiology Eliquis Statin.  LDL goal less than 70 Normotensive blood pressure Glycemic control.  Hgb A1c less than 7 Follow up one year.   Subjective:  Jessica Mullins is an 87 year old right-handed female with paroxysmal atrial fibrillation, hypertension, hyperlipidemia, and history of orthostatic hypotension, melanoma and breast cancer who follows up for recent stroke.  History supplemented by hospital records.  UPDATE: Due to visual symptoms, MRI of brain with and without contrast was performed on 12/10/2020, which was personally reviewed and was stable.  She continued having the visual symptoms.  Since last visit, she began having intermittent dizziness and word-finding difficulty for several weeks.  On 05/08/2021 she began having intermittent double vision.  She was admitted to [facility] on 05/10/2021 for further evaluation.  MRI of brain personally reviewed showed a punctate acute infarct in the anterior left parietal lobe cortex.  Meningioma was stable.  CTA head and neck personally reviewed showed no LVO or hemodynamically significant  stenosis.  Echocardiogram 55-60% with no cardiac source of embolus.  LDL 70 and Hgb A1c 4.8.  She was continued on current medications.      HISTORY: Reports balance problems after treatment for breast cancer with chemotherapy and radiation in 2016.  Always feels off-balance.  Legs feel like "rubber" and heavy.  Saw patient in 2019.  Denied low back and leg pain at that time.  Had evidence of neuropathy in the feet. NCV-EMG on 02/27/2018 showed mild chronic L3-4 radiculopathy bilaterally but no evidence of large fiber polyneuropathy. MRI of lumbar spine on 03/22/2018 showed degenerative changes with moderate spinal stenosis at L3-4 and L4-5 and with right greater than left L4-5 foraminal stenosis possibly affecting right L4 nerve and at L5-S1 possibly compressing left L5 nerve.  Physical therapy was recommended.  In 2020, she developed right buttock pain radiating down right leg with right leg weakness.  MRI of lumbar spine on 05/29/2019 personally reviewed stable compared to 2019.  She underwent right L4 nerve epidural which was effective.  In April 2021, she tripped and struck the back of her head.  MRI of brain with and without contrast on 10/05/2019 personally reviewed showed 2.6 x 2 x 2.7 cm meningioma compressing the right anteriolateral medulla and craniocervical junction without parenchymal edema.  She saw neurosurgery who did not advise surgical intervention.  Last year, she moved to [place]  She started taking part in exercises.  Balance was somewhat improved and did not fall much.  However, her back would act up and she would need to stop exercising.  Balance problems remain her main concern.  NCV-EMG of the lower extremities on 10/14/2020 again showed bilateral chronic L3-4 radiculopathy but progressed compared to prior  study on 02/27/2018.  No evidence of polyneuropathy or myopathy.  Follow up MRI of lumbar spine on 10/22/2020 personally reviewed demonstrated advanced lumbar spine degeneration with  progressed spinal stenosis at L3-4 with right greater than left foraminal impingement, moderate spinal stenosis at L4-5 with impingement of both L5 nerve roots and severe right foraminal impingement, and left L5-S1 left foraminal impingement.  Although she has some low back pain she does not have any radicular pain down the legs.    In May 2022, she started experiencing visual disturbance.  If she looks at something, such as a flower, the petals will suddenly change position and jump around with change in shade of the colors.  It can last a couple of minutes up to an hour.  It would occur daily to every other day for about a week.  No associated vision loss, headache, phantosmia, epigastric rising/nausea, numbness or focal weakness.  It has not occurred while reading, watching TV or while driving.  She saw ophthalmology who did not note any significant findings on exam. She has not had an episode for about a week but had mild symptoms while looking at a painting while sitting in the waiting room.  Denies history of headaches/migraines or seizures.  PAST MEDICAL HISTORY: Past Medical History:  Diagnosis Date   Allergy    Ankle fracture, left    Atrial fibrillation ([place])    Per [place] [facility] Patient Packet    Breast cancer ([place]) 1115/16   left    Bursitis of right shoulder    Cataract    Family history of cancer    Fracture of right wrist    GERD (gastroesophageal reflux disease)    Hyperlipidemia    Hypertension    Lumbar radiculitis    Per [place] [facility] Patient Packet    Meningioma [facility])    Per [place] [facility] Patient Packet    Skin cancer 2000   melanoma and basal cell   Stress fracture    Right Heel, Per [place] New Patient Packet    Vaso vagal episode    during preparation for colonoscopy    MEDICATIONS: Current Outpatient Medications on File Prior to Visit  Medication Sig Dispense Refill   AMBULATORY NON FORMULARY MEDICATION Walker use daily as directed.  Disp 1 Leg pain M79.604 1 each 0   Biotin 5  MG CAPS Take 5 mg by mouth daily.      Calcium Carbonate-Vitamin D 600-400 MG-UNIT tablet Take 1 tablet by mouth daily.     Cholecalciferol (VITAMIN D3) 1000 UNITS CAPS Take 1,000 Units by mouth daily.     cycloSPORINE (RESTASIS) 0.05 % ophthalmic emulsion Place 1 drop into both eyes 2 (two) times daily.     diltiazem (CARDIZEM) 30 MG tablet TAKE 1 TABLET BY MOUTH TWICE DAILY. (Patient taking differently: Take 30 mg by mouth 2 (two) times daily.) 60 tablet 4   diphenhydramine-acetaminophen (TYLENOL PM) 25-500 MG TABS tablet Take 2 tablets by mouth at bedtime as needed (pain).     ELIQUIS 5 MG TABS tablet TAKE 1 TABLET BY MOUTH TWICE DAILY. (Patient taking differently: Take 5 mg by mouth 2 (two) times daily.) 60 tablet 2   flecainide (TAMBOCOR) 50 MG tablet Take 1 tablet (50 mg total) by mouth 2 (two) times daily. 60 tablet 10   ketoconazole (NIZORAL) 2 % cream APPLY TO AFFECTED AREA ONCE DAILY AS NEEDED FOR ITCHING. (Patient taking differently: Apply 1 application topically daily as needed for irritation.) 15 g 1  Multiple Vitamins-Minerals (PRESERVISION AREDS) TABS Take 2 tablets by mouth daily.     Omega-3 Fatty Acids (FISH OIL PO) Take 1 tablet by mouth daily.      polyethylene glycol (MIRALAX / GLYCOLAX) 17 g packet Take 17 g by mouth daily.     pravastatin (PRAVACHOL) 40 MG tablet TAKE ONE TABLET BY MOUTH ONCE DAILY 90 tablet 1   Psyllium (METAMUCIL PO) Take 1 Dose by mouth daily. 1 teaspoon     Turmeric 500 MG CAPS Take 500 capsules by mouth daily.     vitamin B-12 (CYANOCOBALAMIN) 1000 MCG tablet Take 1 tablet (1,000 mcg total) by mouth daily.     No current facility-administered medications on file prior to visit.    ALLERGIES: Allergies  Allergen Reactions   Sulfa Antibiotics Other (See Comments)    As a child, rigid as a stick and not responsive   Tape Other (See Comments)    Blisters, Please use "paper" tape only for short periods of time   Glucosamine Forte [Nutritional  Supplements] Rash   Latex Rash    FAMILY HISTORY: Family History  Problem Relation Age of Onset   Cancer Mother        mets to bone--? primary   Coronary artery disease Brother        died of M! @ 41   Diabetes Brother    Hyperlipidemia Brother    Hypertension Brother    Heart disease Brother        chf   Heart disease Father 54       MI   Lung cancer Sister 20       former smoker   Diabetes Maternal Aunt    Lung cancer Other    Thrombosis Other        thromboembolism clotting disorder--? father died of ? clot    Hypertension Son    High Cholesterol Son       Objective:  Blood pressure 130/88, pulse 63, height 5\' 1"  (1.549 m), weight 154 lb (69.9 kg), SpO2 97 %. General: No acute distress.  Patient appears well-groomed.   Head:  Normocephalic/atraumatic Eyes:  Fundi examined but not visualized Neck: supple, no paraspinal tenderness, full range of motion Heart:  Regular rate and rhythm Lungs:  Clear to auscultation bilaterally Back: No paraspinal tenderness Neurological Exam: alert and oriented to person, place, and time.  Speech fluent and not dysarthric, language intact.  CN II-XII intact. Bulk and tone normal, muscle strength 5/5 throughout.  Sensation to light touch intact.  Deep tendon reflexes absent throughout, toes downgoing.  Finger to nose testing intact.  Broad-based gait.     Metta Clines, DO  CC: Veleta Miners, MD

## 2021-06-03 NOTE — Progress Notes (Signed)
Cardiology Office Note:    Date:  06/03/2021   ID:  Jessica Mullins, DOB 30-Jan-1931, MRN [id number]  PCP:  Virgie Dad, MD  [facility] Cardiologist:  Sanda Klein, MD  [place] Electrophysiologist:  None   Referring MD: Virgie Dad, MD   No chief complaint on file.   History of Present Illness:    Jessica Mullins is a 87 y.o. female with a hx of paroxysmal atrial fibrillation without significant structural heart disease, remote history of vasovagal syncope.  Today she is doing well but she was recently hospitalized with a small stroke.  In November she developed problems with visual changes eventually with severe diplopia.  MRI of the brain showed a small punctate acute left parietal infarction, not an area that would be expected to contribute to visual changes.  CT angiogram of the head and neck did not show any large vessel stenoses and there was mild for age atherosclerosis in the intracranial vessels without significant stenoses.  The echocardiogram showed unchanged findings with normal LV systolic function LA dilated left atrium in the absence of hemodynamically significant valvular abnormalities (significant mitral annular calcification with caseous change reported).  Her symptoms have resolved.  The neurologist recommended adding aspirin to her Eliquis, but she was concerned about the bleeding risk and has not started this medication.  She is on a statin.  She was also evaluated by an ophthalmologist, Dr. Tyrone Schimke who thought that her visual changes were due to stroke.  He has a follow-up visit with neurologist, Dr. Tomi Likens tomorrow.   She is going to have another CT of the chest in a few weeks to monitor an incidentally discovered 6 mm left upper lung nodule.  She has not had any problems with palpitations recently.  She denies dizziness or syncope.  She still has an unsteady gait and uses a walker, but has not had any new falls.  She has not had dizziness, focal  neurological complaints or any falls, injuries or serious bleeding problems.  She denies leg edema, orthopnea or PND.  Her event monitor October 2021 did show a tendency to bradycardia with heart rates frequently in the 50s and borderline criteria for chronotropic incompetence.  Her average heart rate was 64.  The maximum achieved heart rate and sinus rhythm was 103 (75% of predicted for age).   Past Medical History:  Diagnosis Date   Allergy    Ankle fracture, left    Atrial fibrillation ([place])    Per [place] New Patient Packet    Breast cancer ([place]) 1115/16   left    Bursitis of right shoulder    Cataract    Family history of cancer    Fracture of right wrist    GERD (gastroesophageal reflux disease)    Hyperlipidemia    Hypertension    Lumbar radiculitis    Per [place] New Patient Packet    Meningioma [facility])    Per [place] New Patient Packet    Skin cancer 2000   melanoma and basal cell   Stress fracture    Right Heel, Per [place] New Patient Packet    Vaso vagal episode    during preparation for colonoscopy    Past Surgical History:  Procedure Laterality Date   ABDOMINAL HYSTERECTOMY  1988   TAH/BSO--FIBROIDS   APPENDECTOMY     BREAST LUMPECTOMY     B/L--FCS   BREAST LUMPECTOMY WITH RADIOACTIVE SEED AND SENTINEL LYMPH NODE BIOPSY Left 06/11/2015   Procedure: LEFT  BREAST LUMPECTOMY WITH RADIOACTIVE SEED AND LEFT SENTINEL LYMPH NODE MAPPING;  Surgeon: Erroll Luna, MD;  Location: [place];  Service: General;  Laterality: Left;   CATARACT EXTRACTION  2015   COLONOSCOPY  2010   DG  BONE DENSITY ([place] [facility])     EYE SURGERY Bilateral    cataracts   fiberadenoma Bilateral 1978, 1980   GANGLION CYST EXCISION     L  hand   Lipiflow procedure      Current Medications: Current Meds  Medication Sig   AMBULATORY NON FORMULARY MEDICATION Walker use daily as directed.  Disp 1 Leg pain M79.604   Biotin 5 MG CAPS Take 5 mg by mouth daily.    Calcium Carbonate-Vitamin D 600-400 MG-UNIT tablet  Take 1 tablet by mouth daily.   Cholecalciferol (VITAMIN D3) 1000 UNITS CAPS Take 1,000 Units by mouth daily.   cycloSPORINE (RESTASIS) 0.05 % ophthalmic emulsion Place 1 drop into both eyes 2 (two) times daily.   diltiazem (CARDIZEM) 30 MG tablet TAKE 1 TABLET BY MOUTH TWICE DAILY. (Patient taking differently: Take 30 mg by mouth 2 (two) times daily.)   diphenhydramine-acetaminophen (TYLENOL PM) 25-500 MG TABS tablet Take 2 tablets by mouth at bedtime as needed (pain).   ELIQUIS 5 MG TABS tablet TAKE 1 TABLET BY MOUTH TWICE DAILY. (Patient taking differently: Take 5 mg by mouth 2 (two) times daily.)   flecainide (TAMBOCOR) 50 MG tablet Take 1 tablet (50 mg total) by mouth 2 (two) times daily.   ketoconazole (NIZORAL) 2 % cream APPLY TO AFFECTED AREA ONCE DAILY AS NEEDED FOR ITCHING. (Patient taking differently: Apply 1 application topically daily as needed for irritation.)   Multiple Vitamins-Minerals (PRESERVISION AREDS) TABS Take 2 tablets by mouth daily.   Omega-3 Fatty Acids (FISH OIL PO) Take 1 tablet by mouth daily.    polyethylene glycol (MIRALAX / GLYCOLAX) 17 g packet Take 17 g by mouth daily.   pravastatin (PRAVACHOL) 40 MG tablet TAKE ONE TABLET BY MOUTH ONCE DAILY   Psyllium (METAMUCIL PO) Take 1 Dose by mouth daily. 1 teaspoon   Turmeric 500 MG CAPS Take 500 capsules by mouth daily.   vitamin B-12 (CYANOCOBALAMIN) 1000 MCG tablet Take 1 tablet (1,000 mcg total) by mouth daily.     Allergies:   Sulfa antibiotics, Tape, Glucosamine forte [nutritional supplements], and Latex   Social History   Socioeconomic History   Marital status: Widowed    Spouse name: Not on file   Number of children: 2   Years of education: Not on file   Highest education level: Master's degree (e.g., MA, MS, MEng, MEd, [name], [name])  Occupational History   Occupation: Product manager: RETIRED    Comment: retired  Tobacco Use   Smoking status: Never   Smokeless tobacco: Never  Vaping Use   Vaping  Use: Never used  Substance and Sexual Activity   Alcohol use: Yes    Comment: rare   Drug use: No   Sexual activity: Never  Other Topics Concern   Not on file  Social History Narrative   Patient is widowed.  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    One son one daughter   1 caffeinated beverage daily   She is right-handed.    She works out everyday to a televised [employer] 30 minute program.   No tobacco or alcohol         Per [facility] New Patient Packet Abstracted on 01/03/2020  Diet: Left blank       Caffeine: Yes      Married, if yes what year: Widowed, married in [place] you live in a house, apartment, assisted living, condo, trailer, ect: Apartment      Is it one or more stories: One stories, one person       Pets: 1 Neurosurgeon      Current/Past profession: Pharmacist, hospital      Highest level or education completed: MA of Education       Exercise:   Yes             Type and how often: Cardio, chair aerobics, 3-4 times weekly          Living Will: Yes   DNR: Yes   POA/HPOA: Yes      Functional Status:   Do you have difficulty bathing or dressing yourself? Left blank   Do you have difficulty preparing food or eating? Left blank   Do you have difficulty managing your medications? Left blank   Do you have difficulty managing your finances? Left blank   Do you have difficulty affording your medications? Left blank   Social Determinants of Health   Financial Resource Strain: Not on file  Food Insecurity: Not on file  Transportation Needs: Not on file  Physical Activity: Not on file  Stress: Not on file  Social Connections: Not on file     Family History: The patient's family history includes Cancer in her mother; Coronary artery disease in her brother; Diabetes in her brother and maternal aunt; Heart disease in her brother; Heart disease (age of onset: 66) in her father; High Cholesterol in her son; Hyperlipidemia in her brother; Hypertension in her brother and  son; Lung cancer in an other family member; Lung cancer (age of onset: 44) in her sister; Thrombosis in an other family member.  ROS:   Please see the history of present illness.     All other systems reviewed and are negative.  EKGs/Labs/Other Studies Reviewed:    The following studies were reviewed today: Event monitor 09/02-09/15  ECHO 05/11/2021   1. A small pericardial effusion is present. The pericardial effusion is  anterior to the right ventricle. There is no RV or RA collapse suggestive  of increased pericardial pressures.   2. The inferior vena cava is normal in size with greater than 50%  respiratory variability, suggesting right atrial pressure of 3 mmHg.   3. Caseous mitral annular calcifification noted. The mitral valve is  abnormal. No evidence of mitral valve regurgitation. No evidence of mitral  stenosis.   4. Left ventricular ejection fraction, by estimation, is 55 to 60%. The  left ventricle has normal function. The left ventricle has no regional  wall motion abnormalities. There is mild concentric left ventricular  hypertrophy. Left ventricular diastolic  parameters are consistent with Grade II diastolic dysfunction  (pseudonormalization).   5. Right ventricular systolic function is normal. The right ventricular  size is normal. There is normal pulmonary artery systolic pressure.   6. Left atrial size was mildly dilated.   7. The aortic valve was not well visualized. Aortic valve regurgitation  is not visualized. No aortic stenosis is present.   Comparison(s): Prior images unable to be directly viewed, comparison made  by report only. Similar from prior report.   EKG:  EKG is not ordered today.  ECG from 05/10/2021 personally reviewed, shows sinus rhythmECG, T wave inversion in the  anterolateral precordial leads.  There is left axis deviation that does not quite meet criteria for left anterior fascicular block.  QRS 102 ms QTC 424 ms  Recent Labs: 02/11/2021:  TSH 2.99 05/10/2021: ALT 13 05/18/2021: BUN 16; Creatinine 0.7; Hemoglobin 15.2; Platelets 210; Potassium 4.0; Sodium 142  Recent Lipid Panel    Component Value Date/Time   CHOL 146 05/11/2021 0128   TRIG 86 05/11/2021 0128   HDL 59 05/11/2021 0128   CHOLHDL 2.5 05/11/2021 0128   VLDL 17 05/11/2021 0128   LDLCALC 70 05/11/2021 0128   LDLDIRECT 153.4 11/16/2009 0000    Physical Exam:    VS:  BP 108/68 (BP Location: Left Arm, Patient Position: Sitting, Cuff Size: Normal)   Pulse 70   Resp 20   Ht 5\' 1"  (1.549 m)   Wt 153 lb 9.6 oz (69.7 kg)   SpO2 97%   BMI 29.02 kg/m     Wt Readings from Last 3 Encounters:  06/03/21 153 lb 9.6 oz (69.7 kg)  05/25/21 153 lb 3.2 oz (69.5 kg)  05/13/21 153 lb 12.8 oz (69.8 kg)     General: Alert, oriented x3, no distress, overweight Head: no evidence of trauma, PERRL, EOMI, no exophtalmos or lid lag, no myxedema, no xanthelasma; normal ears, nose and oropharynx Neck: normal jugular venous pulsations and no hepatojugular reflux; brisk carotid pulses without delay and no carotid bruits Chest: clear to auscultation, no signs of consolidation by percussion or palpation, normal fremitus, symmetrical and full respiratory excursions Cardiovascular: normal position and quality of the apical impulse, regular rhythm, normal first and second heart sounds, no murmurs, rubs or gallops Abdomen: no tenderness or distention, no masses by palpation, no abnormal pulsatility or arterial bruits, normal bowel sounds, no hepatosplenomegaly Extremities: no clubbing, cyanosis or edema; 2+ radial, ulnar and brachial pulses bilaterally; 2+ right femoral, posterior tibial and dorsalis pedis pulses; 2+ left femoral, posterior tibial and dorsalis pedis pulses; no subclavian or femoral bruits Neurological: grossly nonfocal Psych: Normal mood and affect    ASSESSMENT:    1. Paroxysmal atrial fibrillation (HCC)   2. PAT (paroxysmal atrial tachycardia) ([place])   3.  Drug-induced bradycardia   4. Encounter for monitoring flecainide therapy   5. Long term (current) use of anticoagulants   6. Essential hypertension   7. Ischemic stroke ([place])   8. Hypercholesterolemia     PLAN:    In order of problems listed above:  AFib: Since adding flecainide, her episodes of atrial fibrillation been relatively brief, minimally symptomatic and quite infrequent.  Had to decrease the dose of diltiazem due to relative bradycardia and chronotropic incompetence last year. PAT: Also noted on her event monitor but appeared to be asymptomatic. Bradycardia: Has not had symptomatic bradycardia since we reduce the diltiazem dose.  May have to stop it altogether in the future. Flecainide: No evidence of toxicity to date. Anticoagulation: No actual falls, no bleeding complications. HTN: She has had problems with orthostatic hypotension.  Blood pressure is actually quite low today. Recent CVA: Demonstrated on CT, but not really correlating with her visual symptoms.  Has a follow-up appointment with her neurologist tomorrow.  I am uncertain of the risk/benefit ratio of adding aspirin to her Eliquis.  She should discuss this with Dr. Tomi Likens tomorrow as well. HLP: Lipid profile parameters are all excellent on current statin dose.  Continue.   Medication Adjustments/Labs and Tests Ordered: Current medicines are reviewed at length with the patient today.  Concerns regarding medicines are outlined  above.  No orders of the defined types were placed in this encounter.  No orders of the defined types were placed in this encounter.   Patient Instructions  Medication Instructions:  No changes *If you need a refill on your cardiac medications before your next appointment, please call your pharmacy*   Lab Work: None ordered If you have labs (blood work) drawn today and your tests are completely normal, you will receive your results only by: [place] (if you have MyChart) OR A  paper copy in the mail If you have any lab test that is abnormal or we need to change your treatment, we will call you to review the results.   Testing/Procedures: None ordered   Follow-Up: At [facility], you and your health needs are our priority.  As part of our continuing mission to provide you with exceptional heart care, we have created designated Provider Care Teams.  These Care Teams include your primary Cardiologist (physician) and Advanced Practice Providers (APPs -  Physician Assistants and Nurse Practitioners) who all work together to provide you with the care you need, when you need it.  We recommend signing up for the patient portal called "MyChart".  Sign up information is provided on this After Visit Summary.  MyChart is used to connect with patients for Virtual Visits (Telemedicine).  Patients are able to view lab/test results, encounter notes, upcoming appointments, etc.  Non-urgent messages can be sent to your provider as well.   To learn more about what you can do with MyChart, go to [email or web address].    Your next appointment:   12 month(s)  The format for your next appointment:   In Person  Provider:   Sanda Klein, MD      Signed, Sanda Klein, MD  06/03/2021 2:30 PM    [place]

## 2021-06-03 NOTE — Patient Instructions (Signed)

## 2021-06-04 ENCOUNTER — Encounter: Payer: Self-pay | Admitting: Neurology

## 2021-06-04 ENCOUNTER — Ambulatory Visit: Payer: Medicare PPO | Admitting: Neurology

## 2021-06-04 VITALS — BP 130/88 | HR 63 | Ht 61.0 in | Wt 154.0 lb

## 2021-06-04 DIAGNOSIS — I639 Cerebral infarction, unspecified: Secondary | ICD-10-CM | POA: Diagnosis not present

## 2021-06-04 DIAGNOSIS — Z86011 Personal history of benign neoplasm of the brain: Secondary | ICD-10-CM | POA: Diagnosis not present

## 2021-06-04 DIAGNOSIS — M48061 Spinal stenosis, lumbar region without neurogenic claudication: Secondary | ICD-10-CM

## 2021-06-04 DIAGNOSIS — I10 Essential (primary) hypertension: Secondary | ICD-10-CM | POA: Diagnosis not present

## 2021-06-04 DIAGNOSIS — E7800 Pure hypercholesterolemia, unspecified: Secondary | ICD-10-CM | POA: Diagnosis not present

## 2021-06-04 NOTE — Patient Instructions (Signed)
No change in management.  Wouldn't start a daily aspirin.

## 2021-06-07 ENCOUNTER — Encounter: Payer: Medicare PPO | Admitting: Adult Health

## 2021-06-07 ENCOUNTER — Encounter: Payer: Self-pay | Admitting: Adult Health

## 2021-06-07 ENCOUNTER — Other Ambulatory Visit: Payer: Self-pay

## 2021-06-07 ENCOUNTER — Non-Acute Institutional Stay: Payer: Medicare PPO | Admitting: Adult Health

## 2021-06-07 VITALS — BP 138/86 | HR 59 | Temp 96.1°F | Ht 61.0 in | Wt 154.6 lb

## 2021-06-07 DIAGNOSIS — Z780 Asymptomatic menopausal state: Secondary | ICD-10-CM

## 2021-06-07 DIAGNOSIS — D329 Benign neoplasm of meninges, unspecified: Secondary | ICD-10-CM

## 2021-06-07 DIAGNOSIS — I480 Paroxysmal atrial fibrillation: Secondary | ICD-10-CM

## 2021-06-07 DIAGNOSIS — R911 Solitary pulmonary nodule: Secondary | ICD-10-CM

## 2021-06-07 DIAGNOSIS — I10 Essential (primary) hypertension: Secondary | ICD-10-CM | POA: Diagnosis not present

## 2021-06-07 DIAGNOSIS — Z853 Personal history of malignant neoplasm of breast: Secondary | ICD-10-CM

## 2021-06-07 DIAGNOSIS — G8929 Other chronic pain: Secondary | ICD-10-CM

## 2021-06-07 DIAGNOSIS — Z8673 Personal history of transient ischemic attack (TIA), and cerebral infarction without residual deficits: Secondary | ICD-10-CM

## 2021-06-07 DIAGNOSIS — E7800 Pure hypercholesterolemia, unspecified: Secondary | ICD-10-CM | POA: Diagnosis not present

## 2021-06-07 DIAGNOSIS — M8580 Other specified disorders of bone density and structure, unspecified site: Secondary | ICD-10-CM

## 2021-06-07 DIAGNOSIS — M545 Low back pain, unspecified: Secondary | ICD-10-CM

## 2021-06-07 NOTE — Progress Notes (Signed)
Location: [facility]  POS: clinic  Provider:  Cindi Carbon, [place] [phone]   Code Status:  Goals of Care:  Advanced Directives 06/07/2021  Does Patient Have a Medical Advance Directive? Yes  Type of Paramedic of [place];Living will  Does patient want to make changes to medical advance directive? No - Patient declined  Copy of [place] in Chart? Yes - validated most recent copy scanned in chart (See row information)  Would patient like information on creating a medical advance directive? -  Pre-existing out of facility DNR order (yellow form or pink MOST form) -     Chief Complaint  Patient presents with   Medical Management of Chronic Issues    Patient returns to the clinic for 4 month follow up.    HPI: Patient is a 87 y.o. female seen today for medical management of chronic diseases.    She had some low back pain last night and took tylenol pm with some relief noted. Has chronic back pain and has tried robaxin in the past but say this is too strong.  Seen by neurology 12/9 with intermittent double vision. Concern for TIA but MRI only showed punctate infarct noted but doesn't explain symptoms. ASA not recommended by neuro They think the double vision could be due to migraines. Was in the hospital in Nov for acute on chronic visual field defects mostly on right temporal area diagnosed with CVA.  Symptoms of double vision have resolved. She does note that her left eye has been "lazy" her whole life and has to use her right eye more.    Also has a CT scan scheduled to f/u on lung nodules 92mm LUL 12/14 Hx of meningioma stable size 3.3cm 12/10/20, follows with neurosurgery  Afib controlled on Eliquis follows with cardiology  H/o Breast Cancer Invasive Ductal Carcinoma diagnosed in 2016 Osteopenia Last DEXA was -1.6 in 2020. F/U Dexa ordered in August not done Continues to walk with a rollator. No falls in the  past month.  LDL 75 on pravachol Past Medical History:  Diagnosis Date   Allergy    Ankle fracture, left    Atrial fibrillation ([place])    Per [facility] Packet    Breast cancer ([place]) 1115/16   left    Bursitis of right shoulder    Cataract    Family history of cancer    Fracture of right wrist    GERD (gastroesophageal reflux disease)    Hyperlipidemia    Hypertension    Lumbar radiculitis    Per [facility] [place] [facility] Patient Packet    Meningioma [facility])    Per [facility] Patient Packet    Skin cancer 2000   melanoma and basal cell   Stress fracture    Right Heel, Per [facility] New Patient Packet    Vaso vagal episode    during preparation for colonoscopy    Past Surgical History:  Procedure Laterality Date   ABDOMINAL HYSTERECTOMY  1988   TAH/BSO--FIBROIDS   APPENDECTOMY     BREAST LUMPECTOMY     B/L--[facility]   BREAST LUMPECTOMY WITH RADIOACTIVE SEED AND SENTINEL LYMPH NODE BIOPSY Left 06/11/2015   Procedure: LEFT BREAST LUMPECTOMY WITH RADIOACTIVE SEED AND LEFT SENTINEL LYMPH NODE MAPPING;  Surgeon: Erroll Luna, MD;  Location: [facility];  Service: General;  Laterality: Left;   CATARACT EXTRACTION  2015   COLONOSCOPY  2010   DG  BONE DENSITY ([place] HX)  EYE SURGERY Bilateral    cataracts   fiberadenoma Bilateral 1978, 1980   GANGLION CYST EXCISION     L  hand   Lipiflow procedure      Allergies  Allergen Reactions   Sulfa Antibiotics Other (See Comments)    As a child, rigid as a stick and not responsive   Tape Other (See Comments)    Blisters, Please use "paper" tape only for short periods of time   Glucosamine Forte [Nutritional Supplements] Rash   Latex Rash    Outpatient Encounter Medications as of 06/07/2021  Medication Sig   AMBULATORY NON FORMULARY MEDICATION [place] use daily as directed.  Disp 1 Leg pain M79.604   Biotin 5 MG CAPS Take 5 mg by mouth daily.    Calcium Carbonate-Vitamin D 600-400 MG-UNIT tablet Take 1 tablet by mouth daily.   Cholecalciferol (VITAMIN  D3) 1000 UNITS CAPS Take 1,000 Units by mouth daily.   cycloSPORINE (RESTASIS) 0.05 % ophthalmic emulsion Place 1 drop into both eyes 2 (two) times daily.   diltiazem (CARDIZEM) 30 MG tablet TAKE 1 TABLET BY MOUTH TWICE DAILY. (Patient taking differently: Take 30 mg by mouth 2 (two) times daily.)   diphenhydramine-acetaminophen (TYLENOL PM) 25-500 MG TABS tablet Take 2 tablets by mouth at bedtime as needed (pain).   ELIQUIS 5 MG TABS tablet TAKE 1 TABLET BY MOUTH TWICE DAILY. (Patient taking differently: Take 5 mg by mouth 2 (two) times daily.)   flecainide (TAMBOCOR) 50 MG tablet Take 1 tablet (50 mg total) by mouth 2 (two) times daily.   ketoconazole (NIZORAL) 2 % cream APPLY TO AFFECTED AREA ONCE DAILY AS NEEDED FOR ITCHING. (Patient taking differently: Apply 1 application topically daily as needed for irritation.)   Multiple Vitamins-Minerals (PRESERVISION AREDS) TABS Take 2 tablets by mouth daily.   Omega-3 Fatty Acids (FISH OIL PO) Take 1 tablet by mouth daily.    polyethylene glycol (MIRALAX / GLYCOLAX) 17 g packet Take 17 g by mouth daily.   pravastatin (PRAVACHOL) 40 MG tablet TAKE ONE TABLET BY MOUTH ONCE DAILY   Psyllium (METAMUCIL PO) Take 1 Dose by mouth daily. 1 teaspoon   Turmeric 500 MG CAPS Take 500 capsules by mouth daily.   vitamin B-12 (CYANOCOBALAMIN) 1000 MCG tablet Take 1 tablet (1,000 mcg total) by mouth daily.   No facility-administered encounter medications on file as of 06/07/2021.    Review of Systems:  Review of Systems  Constitutional:  Negative for activity change, appetite change, chills, diaphoresis, fatigue, fever and unexpected weight change.  HENT:  Negative for congestion.   Eyes:  Positive for visual disturbance. Negative for photophobia, pain, discharge, redness and itching.  Respiratory:  Negative for cough, shortness of breath and wheezing.   Cardiovascular:  Negative for chest pain, palpitations and leg swelling.  Gastrointestinal:  Negative for  abdominal distention, abdominal pain, constipation and diarrhea.  Genitourinary:  Negative for difficulty urinating and dysuria.  Musculoskeletal:  Positive for arthralgias and gait problem. Negative for back pain, joint swelling and myalgias.  Neurological:  Negative for dizziness, tremors, seizures, syncope, facial asymmetry, speech difficulty, weakness, light-headedness, numbness and headaches.  Psychiatric/Behavioral:  Negative for agitation, behavioral problems and confusion.    Health Maintenance  Topic Date Due   COVID-19 Vaccine (5 - Booster for Pfizer series) 03/02/2021   MAMMOGRAM  05/26/2021   TETANUS/TDAP  10/24/2030   Pneumonia Vaccine 74+ Years old  Completed   INFLUENZA VACCINE  Completed   DEXA SCAN  Completed  Zoster Vaccines- Shingrix  Completed   HPV VACCINES  Aged Out    Physical Exam: There were no vitals filed for this visit. There is no height or weight on file to calculate BMI. Physical Exam Vitals and nursing note reviewed.  Constitutional:      General: She is not in acute distress.    Appearance: She is not diaphoretic.  HENT:     Head: Normocephalic and atraumatic.     Right Ear: Tympanic membrane and ear canal normal.     Left Ear: Tympanic membrane and ear canal normal.     Nose: Nose normal.     Mouth/Throat:     Mouth: Mucous membranes are moist.     Pharynx: Oropharynx is clear. No oropharyngeal exudate.  Eyes:     Conjunctiva/sclera: Conjunctivae normal.     Pupils: Pupils are equal, round, and reactive to light.  Neck:     Vascular: No carotid bruit or JVD.  Cardiovascular:     Rate and Rhythm: Normal rate and regular rhythm.     Heart sounds: No murmur heard. Pulmonary:     Effort: Pulmonary effort is normal. No respiratory distress.     Breath sounds: Normal breath sounds. No wheezing.  Abdominal:     General: Abdomen is flat. Bowel sounds are normal. There is no distension.     Palpations: Abdomen is soft. There is no mass.      Tenderness: There is no abdominal tenderness.     Hernia: No hernia is present.  Musculoskeletal:        General: No swelling, tenderness, deformity or signs of injury.     Cervical back: No rigidity or tenderness.     Right lower leg: No edema.     Left lower leg: No edema.  Lymphadenopathy:     Cervical: No cervical adenopathy.  Skin:    General: Skin is warm and dry.  Neurological:     General: No focal deficit present.     Mental Status: She is alert and oriented to person, place, and time. Mental status is at baseline.     Cranial Nerves: No cranial nerve deficit.  Psychiatric:        Mood and Affect: Mood normal.    Labs reviewed: Basic Metabolic Panel: Recent Labs    02/11/21 0000 05/10/21 0824 05/18/21 0000  NA 143 140 142  K 4.4 3.9 4.0  CL 105 104 105  CO2 26* 28 27*  GLUCOSE  --  92  --   BUN 17 13 16   CREATININE 0.7 0.76 0.7  CALCIUM 9.8 9.8 10.3  TSH 2.99  --   --    Liver Function Tests: Recent Labs    07/16/20 0000 02/11/21 0000 05/10/21 0824  AST 19 19 19   ALT 19 17 13   ALKPHOS  --  60 46  BILITOT  --   --  0.7  PROT  --   --  6.4*  ALBUMIN 4.4 4.4 4.3   No results for input(s): LIPASE, AMYLASE in the last 8760 hours. No results for input(s): AMMONIA in the last 8760 hours. CBC: Recent Labs    02/11/21 0000 05/10/21 0824 05/18/21 0000  WBC 5.4 7.7 6.5  NEUTROABS  --  5.4  --   HGB 15.5 15.3* 15.2  HCT 45 45.4 48*  MCV  --  92.1  --   PLT 210 193 210   Lipid Panel: Recent Labs    07/16/20 0000 02/11/21 0000 05/11/21  0128  CHOL 152 156 146  HDL 74* 66 59  LDLCALC 65 75 70  TRIG 69 73 86  CHOLHDL  --   --  2.5   Lab Results  Component Value Date   HGBA1C 4.8 05/11/2021    Procedures since last visit: CT HEAD WO CONTRAST  Result Date: 05/10/2021 CLINICAL DATA:  Focal neuro deficit greater than 6 hours. Stroke suspected. Blurred vision in right eye since yesterday. Dizziness and generalized weakness. History of meningioma  at the craniocervical junction. The EXAM: CT HEAD WITHOUT CONTRAST TECHNIQUE: Contiguous axial images were obtained from the base of the skull through the vertex without intravenous contrast. COMPARISON:  CT head 10/05/2019.  MRI brain 12/10/2020. FINDINGS: Brain: The known large posterior fossa mass extending into the foramen magnum appears slightly larger compared with 2021 CT, measuring 2.9 x 2.0 cm transverse on image 4/2 and 2.8 cm on coronal image 21/4. The appearance is similar to the more recent MRI of 5 months ago. There is significant chronic mass effect on the brainstem which is displaced posteriorly to the left. There is no evidence of acute intracranial hemorrhage, new mass lesion, brain edema or extra-axial fluid collection. The ventricles and subarachnoid spaces are appropriately sized for age. There is no CT evidence of acute cortical infarction. There are stable chronic small vessel ischemic changes in the periventricular white matter. Vascular: Intracranial vascular calcifications. No hyperdense vessel identified. Skull: Negative for fracture or focal lesion. Sinuses/Orbits: The visualized paranasal sinuses and mastoid air cells are clear. No orbital abnormalities are seen. Other: Stable postsurgical changes within both globes. IMPRESSION: 1. No evidence of acute intracranial hemorrhage or acute stroke. 2. Known large mass in the posterior fossa with extension into the foramen magnum appears slightly larger from CT 10/05/2019, although not grossly changed from the more recent MRI of 5 months ago. This exerts significant mass effect on the brainstem and may be symptomatic. No associated brain edema or increased ventriculomegaly. 3. Grossly stable chronic small vessel ischemic changes. Electronically Signed   By: Richardean Sale M.D.   On: 05/10/2021 09:08   MR Brain W and Wo Contrast  Result Date: 05/10/2021 CLINICAL DATA:  Brain mass or lesion, focal neuro deficit EXAM: MRI HEAD WITHOUT AND  WITH CONTRAST TECHNIQUE: Multiplanar, multiecho pulse sequences of the brain and surrounding structures were obtained without and with intravenous contrast. CONTRAST:  25mL GADAVIST GADOBUTROL 1 MMOL/ML IV SOLN COMPARISON:  12/10/2020 FINDINGS: Brain: Punctate area of restricted diffusion in the anterior left parietal lobe cortex (series 3, image 39-40 and series 4, image 13), with ADC correlate and without definite associated increased T2 signal. Redemonstrated enhancing mass in the lower right posterior fossa, most likely a meningioma, which compresses lower brainstem and displaces the vertebral arteries. The mass measures approximately 2.9 X 2.1 x 2.5 cm (series 11, image 11 and series 13, image 12), previously 2.8 x 2.1 x 2.6 cm when remeasured similarly, overall unchanged. The mass continues to overlie the right hypoglossal canal, with regional dural thickening along the posterior clivus. There is mass effect on the lower fourth ventricle, but unchanged size and configuration the superior aspect of the fourth ventricle no definite hydrocephalus. No new enhancing mass. No cerebral mass effect or midline shift. No extra-axial collection. T2 hyperintense signal in the periventricular white matter, likely the sequela of chronic small vessel ischemic disease. Vascular: Normal flow voids. Skull and upper cervical spine: Normal marrow signal. Sinuses/Orbits: Negative.  Status post bilateral lens replacements. Other: The mastoids  are well aerated. IMPRESSION: 1. Punctate area of restricted diffusion in the anterior left parietal lobe cortex, consistent with acute infarct. 2. Overall unchanged posterior fossa mass, most likely a meningioma. These results were called by telephone at the time of interpretation on 05/10/2021 at 1:59 pm to provider Dr. Roslynn Amble , who verbally acknowledged these results. Electronically Signed   By: Merilyn Baba M.D.   On: 05/10/2021 14:02   ECHOCARDIOGRAM COMPLETE  Result Date:  05/11/2021    ECHOCARDIOGRAM REPORT   Patient Name:   Jessica Mullins Date of Exam: 05/11/2021 Medical Rec #:  [id number]        Height:       61.0 in Accession #:    [id number]       Weight:       160.0 lb Date of Birth:  [birth year]        BSA:          1.718 m Patient Age:    75 years         BP:           141/82 mmHg Patient Gender: F                HR:           72 bpm. Exam Location:  Inpatient Procedure: 2D Echo, Cardiac Doppler and Color Doppler Indications:    CVA  History:        Patient has prior history of Echocardiogram examinations, most                 recent 08/12/2015. Arrythmias:Atrial Fibrillation; Risk                 Factors:Hypertension and Dyslipidemia.  Sonographer:    Dustin Flock RDCS Referring Phys: [id number] [place]  1. A small pericardial effusion is present. The pericardial effusion is anterior to the right ventricle. There is no RV or RA collapse suggestive of increased pericardial pressures.  2. The inferior vena cava is normal in size with greater than 50% respiratory variability, suggesting right atrial pressure of 3 mmHg.  3. Caseous mitral annular calcifification noted. The mitral valve is abnormal. No evidence of mitral valve regurgitation. No evidence of mitral stenosis.  4. Left ventricular ejection fraction, by estimation, is 55 to 60%. The left ventricle has normal function. The left ventricle has no regional wall motion abnormalities. There is mild concentric left ventricular hypertrophy. Left ventricular diastolic parameters are consistent with Grade II diastolic dysfunction (pseudonormalization).  5. Right ventricular systolic function is normal. The right ventricular size is normal. There is normal pulmonary artery systolic pressure.  6. Left atrial size was mildly dilated.  7. The aortic valve was not well visualized. Aortic valve regurgitation is not visualized. No aortic stenosis is present. Comparison(s): Prior images unable to be directly viewed,  comparison made by report only. Similar from prior report. FINDINGS  Left Ventricle: Left ventricular ejection fraction, by estimation, is 55 to 60%. The left ventricle has normal function. The left ventricle has no regional wall motion abnormalities. The left ventricular internal cavity size was normal in size. There is  mild concentric left ventricular hypertrophy. Left ventricular diastolic parameters are consistent with Grade II diastolic dysfunction (pseudonormalization). Right Ventricle: The right ventricular size is normal. No increase in right ventricular wall thickness. Right ventricular systolic function is normal. There is normal pulmonary artery systolic pressure. The tricuspid regurgitant velocity is 2.59 m/s, and  with an assumed right atrial  pressure of 3 mmHg, the estimated right ventricular systolic pressure is 78.6 mmHg. Left Atrium: Left atrial size was mildly dilated. Right Atrium: Right atrial size was normal in size. Pericardium: A small pericardial effusion is present. The pericardial effusion is anterior to the right ventricle. Mitral Valve: Caseous mitral annular calcifification noted. The mitral valve is abnormal. No evidence of mitral valve regurgitation. No evidence of mitral valve stenosis. Tricuspid Valve: The tricuspid valve is normal in structure. Tricuspid valve regurgitation is mild . No evidence of tricuspid stenosis. Aortic Valve: The aortic valve was not well visualized. Aortic valve regurgitation is not visualized. No aortic stenosis is present. Pulmonic Valve: The pulmonic valve was not well visualized. Pulmonic valve regurgitation is not visualized. No evidence of pulmonic stenosis. Aorta: The aortic root and ascending aorta are structurally normal, with no evidence of dilitation. Venous: The inferior vena cava is normal in size with greater than 50% respiratory variability, suggesting right atrial pressure of 3 mmHg. IAS/Shunts: The atrial septum is grossly normal.  LEFT  VENTRICLE PLAX 2D LVIDd:         4.20 cm   Diastology LVIDs:         2.40 cm   LV e' medial:    3.15 cm/s LV PW:         1.20 cm   LV E/e' medial:  17.0 LV IVS:        1.30 cm   LV e' lateral:   3.92 cm/s LVOT diam:     1.90 cm   LV E/e' lateral: 13.7 LV SV:         69 LV SV Index:   40 LVOT Area:     2.84 cm  RIGHT VENTRICLE RV Basal diam:  2.60 cm RV S prime:     3.59 cm/s LEFT ATRIUM             Index        RIGHT ATRIUM           Index LA diam:        4.00 cm 2.33 cm/m   RA Area:     11.90 cm LA Vol (A2C):   65.5 ml 38.13 ml/m  RA Volume:   22.60 ml  13.16 ml/m LA Vol (A4C):   55.2 ml 32.13 ml/m LA Biplane Vol: 60.0 ml 34.93 ml/m  AORTIC VALVE LVOT Vmax:   118.00 cm/s LVOT Vmean:  82.700 cm/s LVOT VTI:    0.245 m  AORTA Ao Root diam: 3.00 cm MITRAL VALVE                TRICUSPID VALVE MV Area (PHT): 1.82 cm     TR Peak grad:   26.8 mmHg MV Decel Time: 416 msec     TR Vmax:        259.00 cm/s MV E velocity: 53.60 cm/s MV A velocity: 127.00 cm/s  SHUNTS MV E/A ratio:  0.42         Systemic VTI:  0.24 m                             Systemic Diam: 1.90 cm Rudean Haskell MD Electronically signed by Rudean Haskell MD Signature Date/Time: 05/11/2021/4:04:50 PM    Final    CT ANGIO HEAD NECK W WO CM (CODE STROKE)  Result Date: 05/10/2021 CLINICAL DATA:  Initial evaluation for acute stroke. EXAM: CT ANGIOGRAPHY HEAD AND NECK TECHNIQUE: Multidetector CT imaging  of the head and neck was performed using the standard protocol during bolus administration of intravenous contrast. Multiplanar CT image reconstructions and MIPs were obtained to evaluate the vascular anatomy. Carotid stenosis measurements (when applicable) are obtained utilizing NASCET criteria, using the distal internal carotid diameter as the denominator. CONTRAST:  11mL OMNIPAQUE IOHEXOL 350 MG/ML SOLN COMPARISON:  Prior CT and MRI from earlier the same day. FINDINGS: CTA NECK FINDINGS Aortic arch: Visualized aortic arch normal in  caliber with normal branch pattern. Mild atheromatous change about the arch and origin of the great vessels without stenosis. Right carotid system: Right common and internal carotid arteries widely patent without stenosis, dissection or occlusion. Mild for age plaque about the right carotid bulb without stenosis. Left carotid system: Left common and internal carotid arteries widely patent without stenosis, dissection or occlusion. Mild for age plaque about the left carotid bulb without stenosis. Vertebral arteries: Both vertebral arteries arise from the subclavian arteries. No proximal subclavian artery stenosis. Both vertebral arteries widely patent without stenosis, dissection or occlusion. Skeleton: No worrisome lytic or blastic osseous lesions. Mild for age multilevel cervical spondylosis without significant spinal stenosis. Other neck: No other acute soft tissue abnormality within the neck. Upper chest: 6 mm nodule present at the left lung apex (series 6, image 129), indeterminate. Visualized upper chest demonstrates no other acute finding. Review of the MIP images confirms the above findings CTA HEAD FINDINGS Anterior circulation: Both internal carotid arteries patent to the termini without stenosis. Mild for age atheromatous change within the carotid siphons without significant stenosis. A1 segments patent bilaterally. Normal anterior communicating artery complex. Anterior cerebral arteries patent without stenosis. No M1 stenosis or occlusion. Normal MCA bifurcations. Distal MCA branches well perfused and symmetric. Posterior circulation: Dominant left vertebral artery widely patent without stenosis. Diminutive right vertebral artery displaced around the right posterior fossa mass, but remains widely patent. Both PICA origins patent and normal. Basilar patent to its distal aspect without stenosis. Superior cerebral arteries patent bilaterally. Both PCAs primarily supplied via the basilar well perfused or  distal aspects. Venous sinuses: Not well assessed due to timing the contrast bolus. Anatomic variants: None significant.  No aneurysm. Review of the MIP images confirms the above findings IMPRESSION: 1. Negative CTA for large vessel occlusion. 2. Mild for age atheromatous change about the carotid bifurcations and carotid siphons without hemodynamically significant or correctable stenosis. 3. Mass effect on the right V4 segment by the adjacent right posterior fossa mass without stenosis. 4. 6 mm left upper lobe nodule, indeterminate. Non-contrast chest CT at 6-12 months is recommended. If the nodule is stable at time of repeat CT, then future CT at 18-24 months (from today's scan) is considered optional for low-risk patients, but is recommended for high-risk patients. This recommendation follows the consensus statement: Guidelines for Management of Incidental Pulmonary Nodules Detected on CT Images: From the Fleischner Society 2017; Radiology 2017; 284:228-243. Electronically Signed   By: Jeannine Boga M.D.   On: 05/10/2021 22:46    Assessment/Plan  1. Essential hypertension Controlled.   2. Pure hypercholesterolemia LDL ok Continue pravachol  3. Osteopenia after menopause Needs dexa Continue Ca and Vit D Going to try pool exercise. With her back she has difficulty with weight bearing exercise.   4. History of breast cancer In 2016 Need mammo  5. History of CVA (cerebrovascular accident) Continue eliquis statin BP controlled  6. Meningioma ([place]) Continue to monitor via CT  7. Lung nodule Has CT 12/14  8. Chronic bilateral low  back pain without sciatica Recommend stretching and tylenol as needed  9. Afib Followed by cardiology Rate is controlled with flecainide On eliquis for CVA risk reduction   Needs mammogram Needs dexa she is scheduling both of these.   Labs/tests ordered:  * No order type specified * CBC BMP Next appt:  07/27/2021 F/U with Dr Darnell Level in 4  months   Total time 79min:  time greater than 50% of total time spent doing pt counseling and coordination of care

## 2021-06-09 ENCOUNTER — Ambulatory Visit (INDEPENDENT_AMBULATORY_CARE_PROVIDER_SITE_OTHER)
Admission: RE | Admit: 2021-06-09 | Discharge: 2021-06-09 | Disposition: A | Discharge status: Home / Self Care | Payer: Medicare PPO | Source: Ambulatory Visit | Attending: Pulmonary Disease | Admitting: Pulmonary Disease

## 2021-06-09 ENCOUNTER — Other Ambulatory Visit: Payer: Self-pay

## 2021-06-09 DIAGNOSIS — I700 Atherosclerosis of aorta: Secondary | ICD-10-CM | POA: Diagnosis not present

## 2021-06-09 DIAGNOSIS — I3139 Other pericardial effusion (noninflammatory): Secondary | ICD-10-CM | POA: Diagnosis not present

## 2021-06-09 DIAGNOSIS — R911 Solitary pulmonary nodule: Secondary | ICD-10-CM | POA: Diagnosis not present

## 2021-06-22 ENCOUNTER — Telehealth: Payer: Self-pay | Admitting: Neurology

## 2021-06-22 ENCOUNTER — Other Ambulatory Visit: Payer: Self-pay | Admitting: Cardiovascular Disease

## 2021-06-22 NOTE — Telephone Encounter (Signed)
Pt called in stating she has been having some back spasms. She says it's terribly painful. It's been going on a few weeks now. She would like to know what she should do?

## 2021-06-22 NOTE — Telephone Encounter (Signed)
Called patient back and let her know that Dr. Tomi Likens is recommending that she follow up with a pain specialist for her chronic pain do tot Spinal Stenosis

## 2021-06-22 NOTE — Telephone Encounter (Signed)
Called patient back to get more information Pain radiates from Knee to hip sometimes to lower back but mostly in the leg.Patient did see dr. Vertell Limber on one occasion but he felt she did not need to follow up. Patient struggling to remember due to her recent stroke. Patient said pain has been over a year usually uses Voltaren gel but over the last two weeks it has not been helping. Patient also stated she is taking methocarbamol 3x a day but usually does not take it that often.

## 2021-06-29 ENCOUNTER — Telehealth: Payer: Self-pay | Admitting: Neurology

## 2021-06-29 NOTE — Telephone Encounter (Signed)
Pt advised per Last conversation with Vikki Ports, Pt needs to see a pain specialist.  Pt to ask her PCP tomorrow at her visit for a referral.

## 2021-06-29 NOTE — Telephone Encounter (Signed)
Patient called and said she requested a referral for pain management, see telephone note from 06/22/21, but hasn't heard back from anyone for scheduling.  Is there a number she can call?

## 2021-06-30 ENCOUNTER — Other Ambulatory Visit: Payer: Self-pay

## 2021-06-30 ENCOUNTER — Encounter: Payer: Self-pay | Admitting: Family Medicine

## 2021-06-30 ENCOUNTER — Telehealth: Payer: Self-pay | Admitting: *Deleted

## 2021-06-30 ENCOUNTER — Ambulatory Visit: Payer: Medicare PPO | Admitting: Family Medicine

## 2021-06-30 DIAGNOSIS — M5416 Radiculopathy, lumbar region: Secondary | ICD-10-CM

## 2021-06-30 DIAGNOSIS — G8929 Other chronic pain: Secondary | ICD-10-CM | POA: Diagnosis not present

## 2021-06-30 DIAGNOSIS — M545 Low back pain, unspecified: Secondary | ICD-10-CM

## 2021-06-30 MED ORDER — PREDNISONE 20 MG PO TABS: ORAL_TABLET | ORAL | 0 refills | Status: DC

## 2021-06-30 NOTE — Progress Notes (Signed)
Provider:  Alain Honey, MD  Careteam: Patient Care Team: Virgie Dad, MD as PCP - General (Internal Medicine) Croitoru, Dani Gobble, MD as PCP - Cardiology (Cardiology) Allyn Kenner, MD as Consulting Physician (Dermatology) Calvert Cantor, MD as Consulting Physician (Ophthalmology) Pieter Partridge, DO as Consulting Physician (Neurology) Delice Bison, Charlestine Massed, NP as Nurse Practitioner (Hematology and Oncology)  PLACE OF SERVICE:  [place]  Advanced Directive information    Allergies  Allergen Reactions   Sulfa Antibiotics Other (See Comments)    As a child, rigid as a stick and not responsive   Tape Other (See Comments)    Blisters, Please use "paper" tape only for short periods of time   Glucosamine Forte [Nutritional Supplements] Rash   Latex Rash    Chief Complaint  Patient presents with   Acute Visit    Patient present today for right hip pain that radiates to right knee. She report pain come and goes for months now. Patient states that pain feels like a pinched nerve. She reports using heating pad and Voltaren gel for the pain.     HPI: Patient is a 88 y.o. female .  Patient presents today with intermittent right hip and leg pain.  Pain travels as far as her knee but does not go down to the foot.  She has a history of spinal stenosis.  Pain is not continuous; some days are worse than others.  She has been using heating pad and Voltaren gel.  Review of Systems:  Review of Systems  Respiratory: Negative.    Cardiovascular:  Negative for leg swelling.  Genitourinary: Negative.   Musculoskeletal:  Positive for back pain and joint pain.  All other systems reviewed and are negative.  Past Medical History:  Diagnosis Date   Allergy    Ankle fracture, left    Atrial fibrillation ([place])    Per [place] New Patient Packet    Breast cancer ([place]) 1115/16   left    Bursitis of right shoulder    Cataract    Family history of cancer    Fracture of right wrist    GERD  (gastroesophageal reflux disease)    Hyperlipidemia    Hypertension    Lumbar radiculitis    Per [place] New Patient Packet    Meningioma [facility])    Per [place] New Patient Packet    Skin cancer 2000   melanoma and basal cell   Stress fracture    Right Heel, Per [place] New Patient Packet    Vaso vagal episode    during preparation for colonoscopy   Past Surgical History:  Procedure Laterality Date   ABDOMINAL HYSTERECTOMY  1988   TAH/BSO--FIBROIDS   APPENDECTOMY     BREAST LUMPECTOMY     B/L--FCS   BREAST LUMPECTOMY WITH RADIOACTIVE SEED AND SENTINEL LYMPH NODE BIOPSY Left 06/11/2015   Procedure: LEFT BREAST LUMPECTOMY WITH RADIOACTIVE SEED AND LEFT SENTINEL LYMPH NODE MAPPING;  Surgeon: Erroll Luna, MD;  Location: [place];  Service: General;  Laterality: Left;   CATARACT EXTRACTION  2015   COLONOSCOPY  2010   DG  BONE DENSITY ([place] HX)     EYE SURGERY Bilateral    cataracts   fiberadenoma Bilateral 1978, 1980   GANGLION CYST EXCISION     L  hand   Lipiflow procedure     Social History:   reports that she has never smoked. She has never used smokeless tobacco. She reports current alcohol use. She reports that  she does not use drugs.  Family History  Problem Relation Age of Onset   Cancer Mother        mets to bone--? primary   Coronary artery disease Brother        died of M! @ 23   Diabetes Brother    Hyperlipidemia Brother    Hypertension Brother    Heart disease Brother        chf   Heart disease Father 54       MI   Lung cancer Sister 59       former smoker   Diabetes Maternal Aunt    Lung cancer Other    Thrombosis Other        thromboembolism clotting disorder--? father died of ? clot    Hypertension Son    High Cholesterol Son     Medications: Patient's Medications  New Prescriptions   No medications on file  Previous Medications   AMBULATORY NON FORMULARY MEDICATION    Walker use daily as directed.  Disp 1 Leg pain M79.604   BIOTIN 5 MG CAPS    Take 5 mg  by mouth daily.    CALCIUM CARBONATE-VITAMIN D 600-400 MG-UNIT TABLET    Take 1 tablet by mouth daily.   CHOLECALCIFEROL (VITAMIN D3) 1000 UNITS CAPS    Take 1,000 Units by mouth daily.   CYCLOSPORINE (RESTASIS) 0.05 % OPHTHALMIC EMULSION    Place 1 drop into both eyes 2 (two) times daily.   DILTIAZEM (CARDIZEM) 30 MG TABLET    TAKE 1 TABLET BY MOUTH TWICE DAILY.   DIPHENHYDRAMINE-ACETAMINOPHEN (TYLENOL PM) 25-500 MG TABS TABLET    Take 2 tablets by mouth at bedtime as needed (pain).   ELIQUIS 5 MG TABS TABLET    TAKE 1 TABLET BY MOUTH TWICE DAILY.   FLECAINIDE (TAMBOCOR) 50 MG TABLET    TAKE 1 TABLET BY MOUTH TWICE DAILY.   KETOCONAZOLE (NIZORAL) 2 % CREAM    APPLY TO AFFECTED AREA ONCE DAILY AS NEEDED FOR ITCHING.   MULTIPLE VITAMINS-MINERALS (PRESERVISION AREDS) TABS    Take 2 tablets by mouth daily.   OMEGA-3 FATTY ACIDS (FISH OIL PO)    Take 1 tablet by mouth daily.    POLYETHYLENE GLYCOL (MIRALAX / GLYCOLAX) 17 G PACKET    Take 17 g by mouth daily.   PRAVASTATIN (PRAVACHOL) 40 MG TABLET    TAKE ONE TABLET BY MOUTH ONCE DAILY   PSYLLIUM (METAMUCIL PO)    Take 1 Dose by mouth daily. 1 teaspoon   TURMERIC 500 MG CAPS    Take 500 capsules by mouth daily.   VITAMIN B-12 (CYANOCOBALAMIN) 1000 MCG TABLET    Take 1 tablet (1,000 mcg total) by mouth daily.  Modified Medications   No medications on file  Discontinued Medications   No medications on file    Physical Exam:  Vitals:   06/30/21 0824  BP: 130/88  Pulse: 76  Temp: (!) 96.2 F (35.7 C)  SpO2: 98%  Weight: 151 lb 12.8 oz (68.9 kg)  Height: 5' (1.524 m)   Body mass index is 29.65 kg/m. Wt Readings from Last 3 Encounters:  06/30/21 151 lb 12.8 oz (68.9 kg)  06/07/21 154 lb 9.6 oz (70.1 kg)  06/04/21 154 lb (69.9 kg)    Physical Exam Vitals and nursing note reviewed.  Constitutional:      Appearance: Normal appearance.  Cardiovascular:     Rate and Rhythm: Normal rate.  Pulmonary:  Effort: Pulmonary effort is  normal.  Musculoskeletal:        General: Normal range of motion.     Comments: Back exam; normal flexion extension Straight leg raising is negative Deep tendon reflexes showed diminished knee and ankle jerk on the right compared to the left There is some tenderness around the greater trochanter on the right There is no tenderness with percussion over the lumbar spine  Neurological:     Mental Status: She is alert.    Labs reviewed: Basic Metabolic Panel: Recent Labs    02/11/21 0000 05/10/21 0824 05/18/21 0000  NA 143 140 142  K 4.4 3.9 4.0  CL 105 104 105  CO2 26* 28 27*  GLUCOSE  --  92  --   BUN 17 13 16   CREATININE 0.7 0.76 0.7  CALCIUM 9.8 9.8 10.3  TSH 2.99  --   --    Liver Function Tests: Recent Labs    07/16/20 0000 02/11/21 0000 05/10/21 0824  AST 19 19 19   ALT 19 17 13   ALKPHOS  --  60 46  BILITOT  --   --  0.7  PROT  --   --  6.4*  ALBUMIN 4.4 4.4 4.3   No results for input(s): LIPASE, AMYLASE in the last 8760 hours. No results for input(s): AMMONIA in the last 8760 hours. CBC: Recent Labs    02/11/21 0000 05/10/21 0824 05/18/21 0000  WBC 5.4 7.7 6.5  NEUTROABS  --  5.4  --   HGB 15.5 15.3* 15.2  HCT 45 45.4 48*  MCV  --  92.1  --   PLT 210 193 210   Lipid Panel: Recent Labs    07/16/20 0000 02/11/21 0000 05/11/21 0128  CHOL 152 156 146  HDL 74* 66 59  LDLCALC 65 75 70  TRIG 69 73 86  CHOLHDL  --   --  2.5   TSH: Recent Labs    02/11/21 0000  TSH 2.99   A1C: Lab Results  Component Value Date   HGBA1C 4.8 05/11/2021     Assessment/Plan  1. Chronic bilateral low back pain without sciatica Her symptoms today probably represent a flare of her spinal stenosis with inflammation and swelling around nerve or nerves.  I would like to treat this with a short course of prednisone.  I have encouraged her to keep moving as tolerated.  If symptoms do not resolve consider MRI given the difference in reflexes comparing right to  left.   Alain Honey, MD [facility] [phone]

## 2021-06-30 NOTE — Telephone Encounter (Signed)
Patient called and stated that she was seen this morning by Dr. Sabra Heck and he gave her some Prednisone for pain/inflammation.   Patient stated that when she got home the pain wasn't quite so bad.   Patient is wanting to know if she should hold off on taking the Prednisone until the pain comes back or should she go ahead and take it.   Please Advise.

## 2021-06-30 NOTE — Patient Instructions (Signed)
Try to keep moving/walking Take prednisone as prescribed; if no better after med , make follow-up appointment

## 2021-06-30 NOTE — Telephone Encounter (Signed)
Jessica Honour, MD  You 10 minutes ago (4:32 PM)   She told me pain is intermittent; I would recommend taking med as prescribed      Patient notified and agreed.

## 2021-07-09 DIAGNOSIS — Z1231 Encounter for screening mammogram for malignant neoplasm of breast: Secondary | ICD-10-CM | POA: Diagnosis not present

## 2021-07-20 ENCOUNTER — Telehealth: Payer: Self-pay

## 2021-07-20 ENCOUNTER — Other Ambulatory Visit: Payer: Self-pay | Admitting: Internal Medicine

## 2021-07-20 DIAGNOSIS — M8580 Other specified disorders of bone density and structure, unspecified site: Secondary | ICD-10-CM

## 2021-07-20 DIAGNOSIS — Z853 Personal history of malignant neoplasm of breast: Secondary | ICD-10-CM

## 2021-07-20 NOTE — Progress Notes (Signed)
DEXA ordered again

## 2021-07-20 NOTE — Telephone Encounter (Signed)
[facility] called stating patient is to have bone density after her mammogram tomorrow. Note just says post menopause breast cancer. It doesn't say by what doc. Appointment was Dec 27th. [facility]isson just need referral for the bone density.   Tele. [phone] fax [phone]  To Dr. Lyndel Safe

## 2021-07-21 DIAGNOSIS — Z780 Asymptomatic menopausal state: Secondary | ICD-10-CM | POA: Diagnosis not present

## 2021-07-21 DIAGNOSIS — M8588 Other specified disorders of bone density and structure, other site: Secondary | ICD-10-CM | POA: Diagnosis not present

## 2021-07-22 ENCOUNTER — Other Ambulatory Visit: Payer: Self-pay | Admitting: Hematology and Oncology

## 2021-07-22 ENCOUNTER — Telehealth: Payer: Self-pay | Admitting: Internal Medicine

## 2021-07-22 DIAGNOSIS — I4819 Other persistent atrial fibrillation: Secondary | ICD-10-CM

## 2021-07-22 NOTE — Telephone Encounter (Signed)
Let her know that her Bone density is good limits. Will discuss more when she comes for her visit

## 2021-07-23 NOTE — Telephone Encounter (Signed)
Called patient, no answer. LMOM to return call.

## 2021-07-23 NOTE — Telephone Encounter (Signed)
Patient notified and agreed.  

## 2021-07-27 ENCOUNTER — Ambulatory Visit: Payer: Medicare PPO | Admitting: *Deleted

## 2021-07-27 ENCOUNTER — Other Ambulatory Visit: Payer: Self-pay

## 2021-07-27 DIAGNOSIS — M8580 Other specified disorders of bone density and structure, unspecified site: Secondary | ICD-10-CM

## 2021-07-27 DIAGNOSIS — Z780 Asymptomatic menopausal state: Secondary | ICD-10-CM

## 2021-07-27 MED ORDER — DENOSUMAB 60 MG/ML ~~LOC~~ SOSY
60.0000 mg | PREFILLED_SYRINGE | Freq: Once | SUBCUTANEOUS | Status: AC
  Administered 2021-07-27: 60 mg via SUBCUTANEOUS

## 2021-08-02 DIAGNOSIS — H26491 Other secondary cataract, right eye: Secondary | ICD-10-CM | POA: Diagnosis not present

## 2021-08-02 DIAGNOSIS — H43813 Vitreous degeneration, bilateral: Secondary | ICD-10-CM | POA: Diagnosis not present

## 2021-08-02 DIAGNOSIS — H353132 Nonexudative age-related macular degeneration, bilateral, intermediate dry stage: Secondary | ICD-10-CM | POA: Diagnosis not present

## 2021-08-02 DIAGNOSIS — H538 Other visual disturbances: Secondary | ICD-10-CM | POA: Diagnosis not present

## 2021-08-12 ENCOUNTER — Other Ambulatory Visit: Payer: Self-pay

## 2021-08-12 ENCOUNTER — Inpatient Hospital Stay: Payer: Medicare PPO | Attending: Adult Health | Admitting: Adult Health

## 2021-08-12 VITALS — BP 163/83 | HR 60 | Temp 97.9°F | Resp 16 | Ht 60.0 in | Wt 156.5 lb

## 2021-08-12 DIAGNOSIS — Z79899 Other long term (current) drug therapy: Secondary | ICD-10-CM | POA: Insufficient documentation

## 2021-08-12 DIAGNOSIS — Z7901 Long term (current) use of anticoagulants: Secondary | ICD-10-CM | POA: Insufficient documentation

## 2021-08-12 DIAGNOSIS — C50112 Malignant neoplasm of central portion of left female breast: Secondary | ICD-10-CM

## 2021-08-12 DIAGNOSIS — Z9221 Personal history of antineoplastic chemotherapy: Secondary | ICD-10-CM | POA: Insufficient documentation

## 2021-08-12 DIAGNOSIS — Z853 Personal history of malignant neoplasm of breast: Secondary | ICD-10-CM | POA: Insufficient documentation

## 2021-08-12 DIAGNOSIS — Z171 Estrogen receptor negative status [ER-]: Secondary | ICD-10-CM | POA: Diagnosis not present

## 2021-08-12 DIAGNOSIS — Z923 Personal history of irradiation: Secondary | ICD-10-CM | POA: Diagnosis not present

## 2021-08-12 DIAGNOSIS — D329 Benign neoplasm of meninges, unspecified: Secondary | ICD-10-CM | POA: Diagnosis not present

## 2021-08-12 NOTE — Progress Notes (Signed)
CLINIC:  Survivorship   REASON FOR VISIT:  Routine follow-up for history of breast cancer.   BRIEF ONCOLOGIC HISTORY:  Oncology History  Cancer of central portion of left female breast ([place])  05/04/2015 Mammogram    left breast distortion , breast density category A , 7 mm by ultrasound at 3:00 position middle depth   05/12/2015 Initial Diagnosis    left breast biopsy: invasive ductal cancer with DCIS , ER 0%, PR 0%, Ki-67 5%, HER-2 negative ratio 0.83   06/11/2015 Surgery   Left lumpectomy ([place]): DCIS, left additional margin: IDC 1.3 cm + DCIS 1/4 LN positive, grade 2, margins negative, LVI present, ER 0%, PR 0%, HER-2 negative ratio 0.60, Ki-67 5%, T1cN1a stage II a, Mammaprint high risk luminal B   07/20/2015 - 09/21/2015 Chemotherapy   Taxotere and Cytoxan adjuvant chemotherapy  4 cycles   07/24/2015 Procedure   Genetic testing: Negative Ashkenazi Founder mutation panel   01/21/2016 - 02/17/2016 Radiation Therapy   Adjuvant radiation therapy Lisbeth Renshaw). Left breast: 42.5 Gy in 17 fractions. Left breast boost: 7.5 Gy in 3 fractions.       INTERVAL HISTORY:  Ms. Vallely presents to the Survivorship Clinic today for routine follow-up for her history of breast cancer.  Overall, she reports feeling quite well.   Marianna is here for follow-up today.  She tells me that she had her mammogram completed.  There was no evidence of malignancy.  She continues to live in independent living at [facility].  She notes that she has a meningioma on her brainstem which has impacted her ability to balance.  This has also affected her activity level.  She stays up-to-date with primary care visits and otherwise is doing quite well today.  REVIEW OF SYSTEMS:  Review of Systems  Constitutional:  Negative for appetite change, chills, fatigue, fever and unexpected weight change.  HENT:   Negative for hearing loss, lump/mass and trouble swallowing.   Eyes:  Negative for eye problems and icterus.   Respiratory:  Negative for chest tightness, cough and shortness of breath.   Cardiovascular:  Negative for chest pain, leg swelling and palpitations.  Gastrointestinal:  Negative for abdominal distention, abdominal pain, constipation, diarrhea, nausea and vomiting.  Endocrine: Negative for hot flashes.  Genitourinary:  Negative for difficulty urinating.   Musculoskeletal:  Positive for gait problem. Negative for arthralgias.  Skin:  Negative for itching and rash.  Neurological:  Positive for gait problem. Negative for dizziness, extremity weakness, headaches and numbness.  Hematological:  Negative for adenopathy. Does not bruise/bleed easily.  Psychiatric/Behavioral:  Negative for depression. The patient is not nervous/anxious.   Breast: Denies any new nodularity, masses, tenderness, nipple changes, or nipple discharge.       PAST MEDICAL/SURGICAL HISTORY:  Past Medical History:  Diagnosis Date   Allergy    Ankle fracture, left    Atrial fibrillation ([place])    Per [place] New Patient Packet    Breast cancer ([place]) 1115/16   left    Bursitis of right shoulder    Cataract    Family history of cancer    Fracture of right wrist    GERD (gastroesophageal reflux disease)    Hyperlipidemia    Hypertension    Lumbar radiculitis    Per [place] New Patient Packet    Meningioma [facility])    Per [place] New Patient Packet    Skin cancer 2000   melanoma and basal cell   Stress fracture    Right Heel, Per  [place] New Patient Packet    Vaso vagal episode    during preparation for colonoscopy   Past Surgical History:  Procedure Laterality Date   ABDOMINAL HYSTERECTOMY  1988   TAH/BSO--FIBROIDS   APPENDECTOMY     BREAST LUMPECTOMY     B/L--FCS   BREAST LUMPECTOMY WITH RADIOACTIVE SEED AND SENTINEL LYMPH NODE BIOPSY Left 06/11/2015   Procedure: LEFT BREAST LUMPECTOMY WITH RADIOACTIVE SEED AND LEFT SENTINEL LYMPH NODE MAPPING;  Surgeon: Erroll Luna, MD;  Location: [place];  Service: General;   Laterality: Left;   CATARACT EXTRACTION  2015   COLONOSCOPY  2010   DG  BONE DENSITY ([place] HX)     EYE SURGERY Bilateral    cataracts   fiberadenoma Bilateral 1978, 1980   GANGLION CYST EXCISION     L  hand   Lipiflow procedure       ALLERGIES:  Allergies  Allergen Reactions   Sulfa Antibiotics Other (See Comments)    As a child, rigid as a stick and not responsive   Tape Other (See Comments)    Blisters, Please use "paper" tape only for short periods of time   Glucosamine Forte [Nutritional Supplements] Rash   Latex Rash     CURRENT MEDICATIONS:  Outpatient Encounter Medications as of 08/12/2021  Medication Sig   AMBULATORY NON FORMULARY MEDICATION Walker use daily as directed.  Disp 1 Leg pain M79.604   Biotin 5 MG CAPS Take 5 mg by mouth daily.    Calcium Carbonate-Vitamin D 600-400 MG-UNIT tablet Take 1 tablet by mouth daily.   Cholecalciferol (VITAMIN D3) 1000 UNITS CAPS Take 1,000 Units by mouth daily.   cycloSPORINE (RESTASIS) 0.05 % ophthalmic emulsion Place 1 drop into both eyes 2 (two) times daily.   diltiazem (CARDIZEM) 30 MG tablet TAKE 1 TABLET BY MOUTH TWICE DAILY. (Patient taking differently: Take 30 mg by mouth 2 (two) times daily.)   diphenhydramine-acetaminophen (TYLENOL PM) 25-500 MG TABS tablet Take 2 tablets by mouth at bedtime as needed (pain).   ELIQUIS 5 MG TABS tablet TAKE ONE TABLET BY MOUTH TWICE DAILY   flecainide (TAMBOCOR) 50 MG tablet TAKE 1 TABLET BY MOUTH TWICE DAILY.   ketoconazole (NIZORAL) 2 % cream APPLY TO AFFECTED AREA ONCE DAILY AS NEEDED FOR ITCHING. (Patient taking differently: Apply 1 application topically daily as needed for irritation.)   Multiple Vitamins-Minerals (PRESERVISION AREDS) TABS Take 2 tablets by mouth daily.   Omega-3 Fatty Acids (FISH OIL PO) Take 1 tablet by mouth daily.    polyethylene glycol (MIRALAX / GLYCOLAX) 17 g packet Take 17 g by mouth daily.   pravastatin (PRAVACHOL) 40 MG tablet TAKE ONE TABLET BY MOUTH  ONCE DAILY   predniSONE (DELTASONE) 20 MG tablet 2 po at same time daily for 5 days   Psyllium (METAMUCIL PO) Take 1 Dose by mouth daily. 1 teaspoon   Turmeric 500 MG CAPS Take 500 capsules by mouth daily.   vitamin B-12 (CYANOCOBALAMIN) 1000 MCG tablet Take 1 tablet (1,000 mcg total) by mouth daily.   No facility-administered encounter medications on file as of 08/12/2021.     ONCOLOGIC FAMILY HISTORY:  Family History  Problem Relation Age of Onset   Cancer Mother        mets to bone--? primary   Coronary artery disease Brother        died of M! @ 47   Diabetes Brother    Hyperlipidemia Brother    Hypertension Brother    Heart disease Brother  chf   Heart disease Father 44       MI   Lung cancer Sister 77       former smoker   Diabetes Maternal Aunt    Lung cancer Other    Thrombosis Other        thromboembolism clotting disorder--? father died of ? clot    Hypertension Son    High Cholesterol Son     GENETIC COUNSELING/TESTING: negative  SOCIAL HISTORY:  Social History   Socioeconomic History   Marital status: Widowed    Spouse name: Not on file   Number of children: 2   Years of education: Not on file   Highest education level: Master's degree (e.g., MA, MS, MEng, MEd, [name], [name])  Occupational History   Occupation: Product manager: RETIRED    Comment: retired  Tobacco Use   Smoking status: Never   Smokeless tobacco: Never  Vaping Use   Vaping Use: Never used  Substance and Sexual Activity   Alcohol use: Yes    Comment: rare   Drug use: No   Sexual activity: Never  Other Topics Concern   Not on file  Social History Narrative   Patient is widowed.  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    One son one daughter   1 caffeinated beverage daily   She is right-handed.    She works out everyday to a televised [employer] 30 minute program.   No tobacco or alcohol         Per [place] New Patient Packet Abstracted on 01/03/2020      Diet:  Left blank       Caffeine: Yes      Married, if yes what year: Widowed, married in [place] you live in a house, apartment, assisted living, condo, trailer, ect: Apartment      Is it one or more stories: One stories, one person       Pets: 1 Neurosurgeon      Current/Past profession: Pharmacist, hospital      Highest level or education completed: MA of Education       Exercise:   Yes             Type and how often: Cardio, chair aerobics, 3-4 times weekly          Living Will: Yes   DNR: Yes   POA/HPOA: Yes      Functional Status:   Do you have difficulty bathing or dressing yourself? Left blank   Do you have difficulty preparing food or eating? Left blank   Do you have difficulty managing your medications? Left blank   Do you have difficulty managing your finances? Left blank   Do you have difficulty affording your medications? Left blank   Social Determinants of Health   Financial Resource Strain: Not on file  Food Insecurity: Not on file  Transportation Needs: Not on file  Physical Activity: Not on file  Stress: Not on file  Social Connections: Not on file  Intimate Partner Violence: Not on file     PHYSICAL EXAMINATION:  Vital Signs: Vitals:   08/12/21 1106  BP: (!) 163/83  Pulse: 60  Resp: 16  Temp: 97.9 F (36.6 C)  SpO2: 99%   Filed Weights   08/12/21 1106  Weight: 156 lb 8 oz (71 kg)   General: Well-nourished, well-appearing female in no acute distress.  Unaccompanied today.   HEENT:  Head is normocephalic.  Pupils equal and reactive to light. Conjunctivae clear without exudate.  Sclerae anicteric. Oral mucosa is pink, moist.  Oropharynx is pink without lesions or erythema.  Lymph: No cervical, supraclavicular, or infraclavicular lymphadenopathy noted on palpation.  Cardiovascular: Regular rate and rhythm.[name] Respiratory: Clear to auscultation bilaterally. Chest expansion symmetric; breathing non-labored.  Breast Exam:  -Left breast: No appreciable masses on  palpation. No skin redness, thickening, or peau d'orange appearance; no nipple retraction or nipple discharge; mild distortion in symmetry at previous lumpectomy site well healed scar without erythema or nodularity.  -Right breast: No appreciable masses on palpation. No skin redness, thickening, or peau d'orange appearance; no nipple retraction or nipple discharge -Axilla: No axillary adenopathy bilaterally.  GI: Abdomen soft and round; non-tender, non-distended. Bowel sounds normoactive. No hepatosplenomegaly.   GU: Deferred.  Neuro: No focal deficits. Steady gait.  Psych: Mood and affect normal and appropriate for situation.  MSK: No focal spinal tenderness to palpation, full range of motion in bilateral upper extremities Extremities: No edema. Skin: Warm and dry.  LABORATORY DATA:  None for this visit   DIAGNOSTIC IMAGING:  Most recent mammogram:   Awaiting results from [place]:  Ms.. Glaus is a pleasant 88 y.o. female with history of Stage IIA left breast invasive ductal carcinoma, ER-/PR-/HER2-, diagnosed in 04/2015, treated with lumpectomy, adjuvant chemotherapy, and adjuvant radiation therapy. She presents to the Survivorship Clinic for surveillance and routine follow-up.   1. History of breast cancer:  Ms. Shadduck is currently clinically and radiographically without evidence of disease or recurrence of breast cancer.  I do not have her mammogram results and she cannot remember exactly when it was completed so I have asked my nurse to reach out to [facility] to get the sent to [place].  She will continue with annual mammograms.  She wants to go ahead and graduate from her follow-up with [place].  This is incredibly reasonable.  I do recommend that she has a annual clinical breast exam and I reviewed that with her in detail.  2. Bone health:  She was given education on specific food and activities to promote bone health.  3. Cancer screening:  Due to Ms. Winnick's  history and her age, she should receive screening for skin cancers. She was encouraged to follow-up with her PCP for appropriate cancer screenings.   4. Health maintenance and wellness promotion: Ms. Spirito was encouraged to consume 5-7 servings of fruits and vegetables per day. She was also encouraged to engage in moderate to vigorous exercise for 30 minutes per day most days of the week. She was instructed to limit her alcohol consumption and continue to abstain from tobacco use.    Dispo:  -Return to cancer center as needed   Total encounter time: 20 minutes*in face-to-face visit time, chart review, lab review, care coordination, order entry, and documentation of encounter.  Wilber Bihari, NP 08/12/21 11:13 AM Medical Oncology and Hematology [facility] [place], [place] [address] Tel. [phone]    Fax. [phone]  *Total Encounter Time as defined by the Centers for Medicare and Medicaid Services includes, in addition to the face-to-face time of a patient visit (documented in the note above) non-face-to-face time: obtaining and reviewing outside history, ordering and reviewing medications, tests or procedures, care coordination (communications with other health care professionals or caregivers) and documentation in the medical record.   Note: PRIMARY CARE PROVIDER Virgie Dad, MD [phone]

## 2021-08-30 DIAGNOSIS — H43813 Vitreous degeneration, bilateral: Secondary | ICD-10-CM | POA: Diagnosis not present

## 2021-08-30 DIAGNOSIS — H35033 Hypertensive retinopathy, bilateral: Secondary | ICD-10-CM | POA: Diagnosis not present

## 2021-08-30 DIAGNOSIS — H353132 Nonexudative age-related macular degeneration, bilateral, intermediate dry stage: Secondary | ICD-10-CM | POA: Diagnosis not present

## 2021-08-30 DIAGNOSIS — H538 Other visual disturbances: Secondary | ICD-10-CM | POA: Diagnosis not present

## 2021-08-31 ENCOUNTER — Encounter: Payer: Self-pay | Admitting: Nurse Practitioner

## 2021-09-09 ENCOUNTER — Ambulatory Visit: Payer: Medicare PPO | Admitting: Nurse Practitioner

## 2021-09-09 ENCOUNTER — Encounter: Payer: Self-pay | Admitting: Nurse Practitioner

## 2021-09-09 VITALS — BP 142/84 | HR 68 | Ht 60.5 in | Wt 153.0 lb

## 2021-09-09 DIAGNOSIS — K5900 Constipation, unspecified: Secondary | ICD-10-CM | POA: Diagnosis not present

## 2021-09-09 NOTE — Progress Notes (Deleted)
? ? ?Assessment and Plan:   ? ? ? ? ? ?History of Present Illness:  ? ?Patient profile:  ?Jessica Mullins is a 88 y.o. female known to Dr. [name] new to practice with a past medical history significant for ***. See PMH below for any additional history. ? ? ?Chief complaint:  ? ? ? ? ? ? ? ?Previous Labs / Imaging:: ?CBC Latest Ref Rng & Units 05/18/2021 05/10/2021 02/11/2021  ?WBC - 6.5 7.7 5.4  ?Hemoglobin 12.0 - 16.0 15.2 15.3(H) 15.5  ?Hematocrit 36 - 46 48(A) 45.4 45  ?Platelets 150 - 399 210 193 210  ? ? ?Lab Results  ?Component Value Date  ? LIPASE 28 10/05/2019  ? ?CMP Latest Ref Rng & Units 05/18/2021 05/10/2021 02/11/2021  ?Glucose 70 - 99 mg/dL - 92 -  ?BUN 4 - '21 16 13 17  '$ ?Creatinine 0.5 - 1.1 0.7 0.76 0.7  ?Sodium 137 - 147 142 140 143  ?Potassium 3.4 - 5.3 4.0 3.9 4.4  ?Chloride 99 - 108 105 104 105  ?CO2 13 - 22 27(A) 28 26(A)  ?Calcium 8.7 - 10.7 10.3 9.8 9.8  ?Total Protein 6.5 - 8.1 g/dL - 6.4(L) -  ?Total Bilirubin 0.3 - 1.2 mg/dL - 0.7 -  ?Alkaline Phos 38 - 126 U/L - 46 60  ?AST 15 - 41 U/L - 19 19  ?ALT 0 - 44 U/L - 13 17  ? ? ? ? ?Previous GI Evaluations   ? ?Endoscopies: ? ? ? ?Imaging:  ?CT Chest Wo Contrast ?CLINICAL DATA:  Follow-up lung nodule, history of breast cancer ? ?EXAM: ?CT CHEST WITHOUT CONTRAST ? ?TECHNIQUE: ?Multidetector CT imaging of the chest was performed following the ?standard protocol without IV contrast. ? ?COMPARISON:  CT cervical spine, 09/11/2018 ? ?FINDINGS: ?Cardiovascular: Aortic atherosclerosis. Normal heart size. ?Three-vessel coronary artery calcifications. Trace pericardial ?effusion. ? ?Mediastinum/Nodes: No enlarged mediastinal, hilar, or axillary lymph ?nodes. Thyroid gland, trachea, and esophagus demonstrate no ?significant findings. ? ?Lungs/Pleura: Unchanged 0.4 cm nodule of the left pulmonary apex ?(series 3, image 22). Mild subpleural radiation fibrosis of the ?anterior left upper lobe and lingula. Bland appearing, bandlike ?scarring of the bilateral lung  bases, with scarring and volume loss ?of the medial segment right middle lobe. No pleural effusion or ?pneumothorax. ? ?Upper Abdomen: No acute abnormality. ? ?Musculoskeletal: No chest wall mass or suspicious bone lesions ?identified. Status post left lumpectomy. ? ?IMPRESSION: ?1. Unchanged 0.4 cm nodule of the left pulmonary apex, benign. No ?further follow-up or characterization is required. ?2. Postoperative findings of left lumpectomy with minimal underlying ?radiation fibrosis. ?3. Coronary artery disease. ? ?Aortic Atherosclerosis (ICD10-I70.0). ? ?Electronically Signed ?  By: Delanna Ahmadi M.D. ?  On: 06/11/2021 07:40 ? ? ?'@10RELATIVEDAYS'$ @ ? ?Past Medical History:  ?Diagnosis Date  ? Allergy   ? Ankle fracture, left   ? Atrial fibrillation ([place])   ? Per [facility] New Patient Packet   ? Breast cancer ([place]) 1115/16  ? left   ? Bursitis of right shoulder   ? Cataract   ? Family history of cancer   ? Fracture of right wrist   ? GERD (gastroesophageal reflux disease)   ? Hyperlipidemia   ? Hypertension   ? Lumbar radiculitis   ? Per [facility] New Patient Packet   ? Meningioma ([place])   ? Per [facility] New Patient Packet   ? Skin cancer 2000  ? melanoma and basal cell  ? Stress fracture   ? Right Heel, Per [facility]  New Patient Packet   ? Vaso vagal episode   ? during preparation for colonoscopy  ? ?Past Surgical History:  ?Procedure Laterality Date  ? ABDOMINAL HYSTERECTOMY  1988  ? TAH/BSO--FIBROIDS  ? APPENDECTOMY    ? BREAST LUMPECTOMY    ? B/L--FCS  ? BREAST LUMPECTOMY WITH RADIOACTIVE SEED AND SENTINEL LYMPH NODE BIOPSY Left 06/11/2015  ? Procedure: LEFT BREAST LUMPECTOMY WITH RADIOACTIVE SEED AND LEFT SENTINEL LYMPH NODE MAPPING;  Surgeon: Erroll Luna, MD;  Location: [place];  Service: General;  Laterality: Left;  ? CATARACT EXTRACTION  2015  ? COLONOSCOPY  2010  ? DG  BONE DENSITY ([place] HX)    ? EYE SURGERY Bilateral   ? cataracts  ? fiberadenoma Bilateral 1978, 1980  ? GANGLION CYST EXCISION    ? L  hand  ? Lipiflow procedure     ? ?Family History  ?Problem Relation Age of Onset  ? Cancer Mother   ?     mets to bone--? primary  ? Coronary artery disease Brother   ?     died of M! @ 36  ? Diabetes Brother   ? Hyperlipidemia Brother   ? Hypertension Brother   ? Heart disease Brother   ?     chf  ? Heart disease Father 27  ?     MI  ? Lung cancer Sister 73  ?     former smoker  ? Diabetes Maternal Aunt   ? Lung cancer Other   ? Thrombosis Other   ?     thromboembolism clotting disorder--? father died of ? clot   ? Hypertension Son   ? High Cholesterol Son   ? ?Social History  ? ?Tobacco Use  ? Smoking status: Never  ? Smokeless tobacco: Never  ?Vaping Use  ? Vaping Use: Never used  ?Substance Use Topics  ? Alcohol use: Yes  ?  Comment: rare  ? Drug use: No  ? ?Current Outpatient Medications  ?Medication Sig Dispense Refill  ? AMBULATORY NON FORMULARY MEDICATION Walker use daily as directed.  ?Disp 1 ?Leg pain M79.604 1 each 0  ? Biotin 5 MG CAPS Take 5 mg by mouth daily.     ? Calcium Carbonate-Vitamin D 600-400 MG-UNIT tablet Take 1 tablet by mouth daily.    ? Cholecalciferol (VITAMIN D3) 1000 UNITS CAPS Take 1,000 Units by mouth daily.    ? cycloSPORINE (RESTASIS) 0.05 % ophthalmic emulsion Place 1 drop into both eyes 2 (two) times daily.    ? diltiazem (CARDIZEM) 30 MG tablet TAKE 1 TABLET BY MOUTH TWICE DAILY. (Patient taking differently: Take 30 mg by mouth 2 (two) times daily.) 60 tablet 4  ? diphenhydramine-acetaminophen (TYLENOL PM) 25-500 MG TABS tablet Take 2 tablets by mouth at bedtime as needed (pain).    ? ELIQUIS 5 MG TABS tablet TAKE ONE TABLET BY MOUTH TWICE DAILY 60 tablet 2  ? flecainide (TAMBOCOR) 50 MG tablet TAKE 1 TABLET BY MOUTH TWICE DAILY. 60 tablet 10  ? ketoconazole (NIZORAL) 2 % cream APPLY TO AFFECTED AREA ONCE DAILY AS NEEDED FOR ITCHING. (Patient taking differently: Apply 1 application. topically daily as needed for irritation.) 15 g 1  ? Multiple Vitamins-Minerals (PRESERVISION AREDS) TABS Take 2 tablets by  mouth daily.    ? Omega-3 Fatty Acids (FISH OIL PO) Take 1 tablet by mouth daily.     ? polyethylene glycol (MIRALAX / GLYCOLAX) 17 g packet Take 17 g by mouth daily.    ?  pravastatin (PRAVACHOL) 40 MG tablet TAKE ONE TABLET BY MOUTH ONCE DAILY 90 tablet 1  ? Psyllium (METAMUCIL PO) Take 1 Dose by mouth daily. 1 teaspoon    ? Turmeric 500 MG CAPS Take 500 capsules by mouth daily.    ? vitamin B-12 (CYANOCOBALAMIN) 1000 MCG tablet Take 1 tablet (1,000 mcg total) by mouth daily.    ? ?No current facility-administered medications for this visit.  ? ?Allergies  ?Allergen Reactions  ? Sulfa Antibiotics Other (See Comments)  ?  As a child, rigid as a stick and not responsive  ? Tape Other (See Comments)  ?  Blisters, Please use "paper" tape only for short periods of time  ? Glucosamine Forte [Nutritional Supplements] Rash  ? Latex Rash  ? ? ? ?Review of Systems: ?Positive for ***.  All other systems reviewed and negative except where noted in HPI.  ? ?Physical Exam:   ? ?Wt Readings from Last 3 Encounters:  ?09/09/21 153 lb (69.4 kg)  ?08/12/21 156 lb 8 oz (71 kg)  ?06/30/21 151 lb 12.8 oz (68.9 kg)  ? ? ?BP (!) 142/84   Pulse 68   Ht 5' 0.5" (1.537 m)   Wt 153 lb (69.4 kg)   BMI 29.39 kg/m?  ?Constitutional:  Generally well appearing ***female in no acute distress. ?Psychiatric: Pleasant. Normal mood and affect. Behavior is normal. ?EENT: Pupils normal.  Conjunctivae are normal. No scleral icterus. ?Neck supple.  ?Cardiovascular: Normal rate, regular rhythm. No edema ?Pulmonary/chest: Effort normal and breath sounds normal. No wheezing, rales or rhonchi. ?Abdominal: Soft, nondistended, nontender. Bowel sounds active throughout. There are no masses palpable. No hepatomegaly. ?Neurological: Alert and oriented to person place and time. ?Skin: Skin is warm and dry. No rashes noted. ? ?Tye Savoy, NP  09/09/2021, 2:11 PM ? ?Cc:  ?Referring Provider ?Virgie Dad, MD ? ? ? ? ? ? ? ?

## 2021-09-09 NOTE — Patient Instructions (Addendum)
Constipation;  ?--Continue daily Miralax ?--Add daily Senokot tablet  ?--Hold Metamucil for now due to to bloating ?--Can increase Miralax to twice daily if adding senokot doesn't help. ?--Can also try Glycerin suppositories as needed to help evacuate stool .  ?--Remember to sit upright on toilet during a bowel movement ( do not lean forward over knees) ? ?If you are age 86 or older, your body mass index should be between 23-30. Your Body mass index is 29.39 kg/m?[name] If this is out of the aforementioned range listed, please consider follow up with your Primary Care Provider. ? ?If you are age 37 or younger, your body mass index should be between 19-25. Your Body mass index is 29.39 kg/m?[name] If this is out of the aformentioned range listed, please consider follow up with your Primary Care Provider.  ? ?Follow up in 4 weeks. ? ?The [facility] providers would like to encourage you to use [facility] to communicate with providers for non-urgent requests or questions.  Due to long hold times on the telephone, sending your provider a message by [facility] may be a faster and more efficient way to get a response.  Please allow 48 business hours for a response.  Please remember that this is for non-urgent requests.  ? ?It was a pleasure to see you today! ? ?Thank you for trusting me with your gastrointestinal care!   ? ?Tye Savoy , NP  ? ?

## 2021-09-09 NOTE — Progress Notes (Addendum)
? ? ?Assessment and Plan:   ? ?# 88 yo female with chronic constipation with hard, difficult to pass stools but also occasional bouts of diarrhea. She has been diagnosed with a rectocele which may or may not complicating things   ?--Continue daily Miralax ?--Add daily Senokot tablet  ?--Hold Metamucil for now due to to bloating ?--Can increase Miralax to twice daily if adding senokot doesn't help. ?--Can also try Glycerin suppositories as needed to help evacuate stool .  ?--Sit upright on toilet, do not lean forward over knees ?--follow up with me in 4 weeks. ?--It may be difficult for her to add enemas. The next step may be a trial of Amitiza   ? ?# Post-prandial bloating 2-3 times a week. It is hard for her to [facility], doesn't cook for herself. Doesn't use artifical sweeteners. Dairy doesn't bother her.  ?--Hold Metamucil for now as it may be adding to the bloating  ?--Treat constipation ? ?# Hemorrhoids.  Not associated with pain or bleeding but interfere with hygiene.  ?--Using Prep H. We discussed that getting constipation under better control is the most important thing right now.   ? ?History of Present Illness:  ? ?Patient profile:  ?Jessica Mullins is a 88 y.o. female known to Dr. Carlean Purl with a past medical history significant for .  ?Breast cancer, AFIB on Eliquis, chronic constipation, HLD, TIA ? ?Chief complaint:  constipation but sometime has a few days of diarrhea ? ? Ms Lukin was last seen in 2019 for treatment of constipation, diarrhea, anal discomfort. On exam wih ansocopy she had:  ?Rectocele moderate size ?Decreased rest/vol squeeze ?Otherwise normal ?  ?Anoscopy mldly inflamed Gr 2 ?Prescribed miralx.  ? ?INTERVAL HISTORY : ?She struggles with almost daily constipation but occasionally has a bout of diarrhea lasting a couple of days. She takes a capful of miralax and metamucil every day. Despite this regimen she may go 2 or 3 days without a BM . When she does have a BM it is often  hard, difficult to pass  She never really ever has a normal BM. She drinks water all day long. Straining bothers her hemorrhoids.They do not hurt or bleed but interferes with hygiene. She thinks the hemorrhoids sometimes cause her to feel like she has to have a BM even if she doesn't.  Also she has been having abdominal bloating a couple of times a week, most often after eating.  She cannot correlate the bloating to any particular foods but then someone else prepares her meals. She doesn't use artificial sweeteners. Dairy doesn't seem to bother her.  ? ?Previous Labs / Imaging:: ?CBC Latest Ref Rng & Units 05/18/2021 05/10/2021 02/11/2021  ?WBC - 6.5 7.7 5.4  ?Hemoglobin 12.0 - 16.0 15.2 15.3(H) 15.5  ?Hematocrit 36 - 46 48(A) 45.4 45  ?Platelets 150 - 399 210 193 210  ? ? ? ?CMP Latest Ref Rng & Units 05/18/2021 05/10/2021 02/11/2021  ?Glucose 70 - 99 mg/dL - 92 -  ?BUN 4 - '21 16 13 17  '$ ?Creatinine 0.5 - 1.1 0.7 0.76 0.7  ?Sodium 137 - 147 142 140 143  ?Potassium 3.4 - 5.3 4.0 3.9 4.4  ?Chloride 99 - 108 105 104 105  ?CO2 13 - 22 27(A) 28 26(A)  ?Calcium 8.7 - 10.7 10.3 9.8 9.8  ?Total Protein 6.5 - 8.1 g/dL - 6.4(L) -  ?Total Bilirubin 0.3 - 1.2 mg/dL - 0.7 -  ?Alkaline Phos 38 - 126 U/L - 46 60  ?  AST 15 - 41 U/L - 19 19  ?ALT 0 - 44 U/L - 13 17  ? ? ? ?PREVIOUS GI EVALUATIONS  ? ?Endoscopies: ?2011 screening colonoscopy  ?-Hemorrhoids. Otherwise normal.  ? ?Imaging:  ?CT Chest Wo Contrast ?CLINICAL DATA:  Follow-up lung nodule, history of breast cancer ? ?EXAM: ?CT CHEST WITHOUT CONTRAST ? ?TECHNIQUE: ?Multidetector CT imaging of the chest was performed following the ?standard protocol without IV contrast. ? ?COMPARISON:  CT cervical spine, 09/11/2018 ? ?FINDINGS: ?Cardiovascular: Aortic atherosclerosis. Normal heart size. ?Three-vessel coronary artery calcifications. Trace pericardial ?effusion. ? ?Mediastinum/Nodes: No enlarged mediastinal, hilar, or axillary lymph ?nodes. Thyroid gland, trachea, and esophagus  demonstrate no ?significant findings. ? ?Lungs/Pleura: Unchanged 0.4 cm nodule of the left pulmonary apex ?(series 3, image 22). Mild subpleural radiation fibrosis of the ?anterior left upper lobe and lingula. Bland appearing, bandlike ?scarring of the bilateral lung bases, with scarring and volume loss ?of the medial segment right middle lobe. No pleural effusion or ?pneumothorax. ? ?Upper Abdomen: No acute abnormality. ? ?Musculoskeletal: No chest wall mass or suspicious bone lesions ?identified. Status post left lumpectomy. ? ?IMPRESSION: ?1. Unchanged 0.4 cm nodule of the left pulmonary apex, benign. No ?further follow-up or characterization is required. ?2. Postoperative findings of left lumpectomy with minimal underlying ?radiation fibrosis. ?3. Coronary artery disease. ? ?Aortic Atherosclerosis (ICD10-I70.0). ? ?Electronically Signed ?  By: Delanna Ahmadi M.D. ?  On: 06/11/2021 07:40 ? ? ?Past Medical History:  ?Diagnosis Date  ? Allergy   ? Ankle fracture, left   ? Atrial fibrillation ([place])   ? Per [facility] New Patient Packet   ? Breast cancer ([place]) 1115/16  ? left   ? Bursitis of right shoulder   ? Cataract   ? Family history of cancer   ? Fracture of right wrist   ? GERD (gastroesophageal reflux disease)   ? Hyperlipidemia   ? Hypertension   ? Lumbar radiculitis   ? Per [facility] New Patient Packet   ? Meningioma ([place])   ? Per [facility] New Patient Packet   ? Skin cancer 2000  ? melanoma and basal cell  ? Stress fracture   ? Right Heel, Per [place] New Patient Packet   ? Vaso vagal episode   ? during preparation for colonoscopy  ? ?Past Surgical History:  ?Procedure Laterality Date  ? ABDOMINAL HYSTERECTOMY  1988  ? TAH/BSO--FIBROIDS  ? APPENDECTOMY    ? BREAST LUMPECTOMY    ? B/L--FCS  ? BREAST LUMPECTOMY WITH RADIOACTIVE SEED AND SENTINEL LYMPH NODE BIOPSY Left 06/11/2015  ? Procedure: LEFT BREAST LUMPECTOMY WITH RADIOACTIVE SEED AND LEFT SENTINEL LYMPH NODE MAPPING;  Surgeon: Erroll Luna, MD;  Location: [place];  Service:  General;  Laterality: Left;  ? CATARACT EXTRACTION  2015  ? COLONOSCOPY  2010  ? DG  BONE DENSITY ([place] HX)    ? EYE SURGERY Bilateral   ? cataracts  ? fiberadenoma Bilateral 1978, 1980  ? GANGLION CYST EXCISION    ? L  hand  ? Lipiflow procedure    ? ?Family History  ?Problem Relation Age of Onset  ? Cancer Mother   ?     mets to bone--? primary  ? Coronary artery disease Brother   ?     died of M! @ 51  ? Diabetes Brother   ? Hyperlipidemia Brother   ? Hypertension Brother   ? Heart disease Brother   ?     chf  ? Heart disease Father 69  ?  MI  ? Lung cancer Sister 32  ?     former smoker  ? Diabetes Maternal Aunt   ? Lung cancer Other   ? Thrombosis Other   ?     thromboembolism clotting disorder--? father died of ? clot   ? Hypertension Son   ? High Cholesterol Son   ? ?Social History  ? ?Tobacco Use  ? Smoking status: Never  ? Smokeless tobacco: Never  ?Vaping Use  ? Vaping Use: Never used  ?Substance Use Topics  ? Alcohol use: Yes  ?  Comment: rare  ? Drug use: No  ? ?Current Outpatient Medications  ?Medication Sig Dispense Refill  ? AMBULATORY NON FORMULARY MEDICATION Walker use daily as directed.  ?Disp 1 ?Leg pain M79.604 1 each 0  ? Biotin 5 MG CAPS Take 5 mg by mouth daily.     ? Calcium Carbonate-Vitamin D 600-400 MG-UNIT tablet Take 1 tablet by mouth daily.    ? Cholecalciferol (VITAMIN D3) 1000 UNITS CAPS Take 1,000 Units by mouth daily.    ? cycloSPORINE (RESTASIS) 0.05 % ophthalmic emulsion Place 1 drop into both eyes 2 (two) times daily.    ? diltiazem (CARDIZEM) 30 MG tablet TAKE 1 TABLET BY MOUTH TWICE DAILY. (Patient taking differently: Take 30 mg by mouth 2 (two) times daily.) 60 tablet 4  ? diphenhydramine-acetaminophen (TYLENOL PM) 25-500 MG TABS tablet Take 2 tablets by mouth at bedtime as needed (pain).    ? ELIQUIS 5 MG TABS tablet TAKE ONE TABLET BY MOUTH TWICE DAILY 60 tablet 2  ? flecainide (TAMBOCOR) 50 MG tablet TAKE 1 TABLET BY MOUTH TWICE DAILY. 60 tablet 10  ? ketoconazole  (NIZORAL) 2 % cream APPLY TO AFFECTED AREA ONCE DAILY AS NEEDED FOR ITCHING. (Patient taking differently: Apply 1 application. topically daily as needed for irritation.) 15 g 1  ? Multiple Vitamins-Minerals (PRES

## 2021-09-28 DIAGNOSIS — I639 Cerebral infarction, unspecified: Secondary | ICD-10-CM | POA: Diagnosis not present

## 2021-09-28 DIAGNOSIS — I10 Essential (primary) hypertension: Secondary | ICD-10-CM | POA: Diagnosis not present

## 2021-09-28 LAB — BASIC METABOLIC PANEL
BUN: 18 (ref 4–21)
CO2: 27 — AB (ref 13–22)
Chloride: 102 (ref 99–108)
Creatinine: 0.7 (ref 0.5–1.1)
Glucose: 96
Potassium: 4.2 mEq/L (ref 3.5–5.1)
Sodium: 138 (ref 137–147)

## 2021-09-28 LAB — COMPREHENSIVE METABOLIC PANEL
Albumin: 4.2 (ref 3.5–5.0)
Calcium: 9.9 (ref 8.7–10.7)
Globulin: 1.7

## 2021-09-28 LAB — CBC AND DIFFERENTIAL
HCT: 44 (ref 36–46)
Hemoglobin: 14.6 (ref 12.0–16.0)
Platelets: 208 10*3/uL (ref 150–400)
WBC: 6

## 2021-09-28 LAB — CBC: RBC: 4.71 (ref 3.87–5.11)

## 2021-09-28 LAB — HEPATIC FUNCTION PANEL
ALT: 12 U/L (ref 7–35)
AST: 16 (ref 13–35)
Alkaline Phosphatase: 65 (ref 25–125)
Bilirubin, Total: 0.3

## 2021-10-04 ENCOUNTER — Other Ambulatory Visit: Payer: Self-pay | Admitting: Cardiovascular Disease

## 2021-10-05 DIAGNOSIS — H353132 Nonexudative age-related macular degeneration, bilateral, intermediate dry stage: Secondary | ICD-10-CM | POA: Diagnosis not present

## 2021-10-06 ENCOUNTER — Encounter: Payer: Self-pay | Admitting: Internal Medicine

## 2021-10-06 ENCOUNTER — Non-Acute Institutional Stay: Payer: Medicare PPO | Admitting: Internal Medicine

## 2021-10-06 VITALS — BP 146/78 | HR 57 | Temp 97.5°F | Ht 60.5 in | Wt 152.6 lb

## 2021-10-06 DIAGNOSIS — G8929 Other chronic pain: Secondary | ICD-10-CM

## 2021-10-06 DIAGNOSIS — M545 Low back pain, unspecified: Secondary | ICD-10-CM

## 2021-10-06 DIAGNOSIS — Z8673 Personal history of transient ischemic attack (TIA), and cerebral infarction without residual deficits: Secondary | ICD-10-CM | POA: Diagnosis not present

## 2021-10-06 DIAGNOSIS — I10 Essential (primary) hypertension: Secondary | ICD-10-CM | POA: Diagnosis not present

## 2021-10-06 DIAGNOSIS — Z853 Personal history of malignant neoplasm of breast: Secondary | ICD-10-CM

## 2021-10-06 DIAGNOSIS — R911 Solitary pulmonary nodule: Secondary | ICD-10-CM

## 2021-10-06 DIAGNOSIS — I480 Paroxysmal atrial fibrillation: Secondary | ICD-10-CM

## 2021-10-06 DIAGNOSIS — D329 Benign neoplasm of meninges, unspecified: Secondary | ICD-10-CM

## 2021-10-06 DIAGNOSIS — E7800 Pure hypercholesterolemia, unspecified: Secondary | ICD-10-CM

## 2021-10-06 NOTE — Progress Notes (Signed)
? ?Location:  [name] ?  ?Place of Service:  Clinic (12) ? ?Provider:  ? ?Code Status:  ?Goals of Care:  ? ?  08/12/2021  ? 11:05 AM  ?Advanced Directives  ?Does Patient Have a Medical Advance Directive? Yes  ?Type of Advance Directive Living will;Healthcare Power of Attorney  ?Copy of [place] in Chart? No - copy requested  ? ? ? ?Chief Complaint  ?Patient presents with  ? Medical Management of Chronic Issues  ?  Patient returns to the clinic for 4 month follow up.   ? ? ?HPI: Patient is a 88 y.o. female seen today for medical management of chronic diseases.   ? ?Patient has h/o Meningoma Diagnosed with MRI in 4/21 Has seen Neurosurgery ?H/o Low Back Pain with Radiculopathy follows with Neurology ?H/o Breast Cancer Invasive Ductal Carcinoma diagnosed in 2016 ?Osteopenia Last DEXA was -1.6 in 2020 ?H/o PAF  Follows with Cardiology On Flecainide and Eliquis Also h/o Bradycardia ?Hypertension with Tendency for Orthostatic Hypotension ?  ?Also h/o issue with her balance ?Was seen by both Cardiology and Neurology and do not have definite  answer for her Recurrent falls.  ?Since her last visit patient has not had any falls.  She denies any dizziness.  Usually it happens when she is not using her walker and her legs feel weak and she goes down. ? ?Admitted in the hospital in 11/22 for Acute Visual Defects Also Acute Parietal Lobe infarct ?Neurology recommended adding Aspirin to Eliquis but she refused ?Continues to have Diplopia but follows with Ophthalmologist ?Seen By Dr Tomi Likens Who thinks her Visual Defects are not explained by infarct. No Aspirin needed ? ?Pulmonary Nodule ?CT chest Stable no further follow up needed ?PAF ?On Flecainide and Eliquis ?H/o Breast cancer ?Per Oncology she will follow with them PRN  ?Continue Mammograms annually ?Constipation ?Seen by GI taking Miralax and Metamucil as needed ? ? No Other Issues ?Walks with her walker now ?No Recent Falls ? ?Past  Medical History:  ?Diagnosis Date  ? Allergy   ? Ankle fracture, left   ? Atrial fibrillation ([place])   ? Per [facility] New Patient Packet   ? Breast cancer ([place]) 1115/16  ? left   ? Bursitis of right shoulder   ? Cataract   ? Family history of cancer   ? Fracture of right wrist   ? GERD (gastroesophageal reflux disease)   ? Hyperlipidemia   ? Hypertension   ? Lumbar radiculitis   ? Per [facility] New Patient Packet   ? Meningioma ([place])   ? Per [facility] New Patient Packet   ? Skin cancer 2000  ? melanoma and basal cell  ? Stress fracture   ? Right Heel, Per [place] New Patient Packet   ? Vaso vagal episode   ? during preparation for colonoscopy  ? ? ?Past Surgical History:  ?Procedure Laterality Date  ? ABDOMINAL HYSTERECTOMY  1988  ? TAH/BSO--FIBROIDS  ? APPENDECTOMY    ? BREAST LUMPECTOMY    ? B/L--FCS  ? BREAST LUMPECTOMY WITH RADIOACTIVE SEED AND SENTINEL LYMPH NODE BIOPSY Left 06/11/2015  ? Procedure: LEFT BREAST LUMPECTOMY WITH RADIOACTIVE SEED AND LEFT SENTINEL LYMPH NODE MAPPING;  Surgeon: Erroll Luna, MD;  Location: [facility];  Service: General;  Laterality: Left;  ? CATARACT EXTRACTION  2015  ? COLONOSCOPY  2010  ? DG  BONE DENSITY ([place] HX)    ? EYE SURGERY Bilateral   ? cataracts  ? fiberadenoma Bilateral 1978,  1980  ? GANGLION CYST EXCISION    ? L  hand  ? Lipiflow procedure    ? ? ?Allergies  ?Allergen Reactions  ? Sulfa Antibiotics Other (See Comments)  ?  As a child, rigid as a stick and not responsive  ? Tape Other (See Comments)  ?  Blisters, Please use "paper" tape only for short periods of time  ? Glucosamine Forte [Nutritional Supplements] Rash  ? Latex Rash  ? ? ?Outpatient Encounter Medications as of 10/06/2021  ?Medication Sig  ? AMBULATORY NON FORMULARY MEDICATION Walker use daily as directed.  ?Disp 1 ?Leg pain M79.604  ? Biotin 5 MG CAPS Take 5 mg by mouth daily.   ? Calcium Carbonate-Vitamin D 600-400 MG-UNIT tablet Take 1 tablet by mouth daily.  ? Cholecalciferol (VITAMIN D3) 1000 UNITS CAPS Take 1,000 Units by  mouth daily.  ? cycloSPORINE (RESTASIS) 0.05 % ophthalmic emulsion Place 1 drop into both eyes 2 (two) times daily.  ? diltiazem (CARDIZEM) 30 MG tablet TAKE ONE TABLET BY MOUTH TWICE DAILY  ? diphenhydramine-acetaminophen (TYLENOL PM) 25-500 MG TABS tablet Take 2 tablets by mouth at bedtime as needed (pain).  ? ELIQUIS 5 MG TABS tablet TAKE ONE TABLET BY MOUTH TWICE DAILY  ? flecainide (TAMBOCOR) 50 MG tablet TAKE 1 TABLET BY MOUTH TWICE DAILY.  ? ketoconazole (NIZORAL) 2 % cream APPLY TO AFFECTED AREA ONCE DAILY AS NEEDED FOR ITCHING. (Patient taking differently: Apply 1 application. topically daily as needed for irritation.)  ? Multiple Vitamins-Minerals (PRESERVISION AREDS) TABS Take 2 tablets by mouth daily.  ? Omega-3 Fatty Acids (FISH OIL PO) Take 1 tablet by mouth daily.   ? polyethylene glycol (MIRALAX / GLYCOLAX) 17 g packet Take 17 g by mouth daily.  ? pravastatin (PRAVACHOL) 40 MG tablet TAKE ONE TABLET BY MOUTH ONCE DAILY  ? Psyllium (METAMUCIL PO) Take 1 Dose by mouth daily. 1 teaspoon  ? Turmeric 500 MG CAPS Take 500 capsules by mouth daily.  ? vitamin B-12 (CYANOCOBALAMIN) 1000 MCG tablet Take 1 tablet (1,000 mcg total) by mouth daily.  ? ?No facility-administered encounter medications on file as of 10/06/2021.  ? ? ?Review of Systems:  ?Review of Systems  ?Constitutional:  Negative for activity change and appetite change.  ?HENT: Negative.    ?Respiratory:  Negative for cough and shortness of breath.   ?Cardiovascular:  Negative for leg swelling.  ?Gastrointestinal:  Positive for constipation.  ?Genitourinary: Negative.   ?Musculoskeletal:  Positive for gait problem. Negative for arthralgias and myalgias.  ?Skin: Negative.   ?Neurological:  Positive for weakness. Negative for dizziness.  ?Psychiatric/Behavioral:  Negative for confusion, dysphoric mood and sleep disturbance.   ? ?Health Maintenance  ?Topic Date Due  ? COVID-19 Vaccine (5 - Booster for Pfizer series) 03/02/2021  ? INFLUENZA VACCINE   01/25/2022  ? MAMMOGRAM  07/11/2022  ? TETANUS/TDAP  10/24/2030  ? Pneumonia Vaccine 32+ Years old  Completed  ? DEXA SCAN  Completed  ? Zoster Vaccines- Shingrix  Completed  ? HPV VACCINES  Aged Out  ? ? ?Physical Exam: ?Vitals:  ? 10/06/21 1103  ?BP: (!) 146/78  ?Pulse: (!) 57  ?Temp: (!) 97.5 ?F (36.4 ?C)  ?SpO2: 98%  ?Weight: 152 lb 9.6 oz (69.2 kg)  ?Height: 5' 0.5" (1.537 m)  ? ?Body mass index is 29.31 kg/m?[name] ?Physical Exam ?Vitals reviewed.  ?Constitutional:   ?   Appearance: Normal appearance.  ?HENT:  ?   Head: Normocephalic.  ?   Right Ear:  Tympanic membrane normal.  ?   Left Ear: Tympanic membrane normal.  ?   Nose: Nose normal.  ?   Mouth/Throat:  ?   Mouth: Mucous membranes are moist.  ?   Pharynx: Oropharynx is clear.  ?Eyes:  ?   Pupils: Pupils are equal, round, and reactive to light.  ?Cardiovascular:  ?   Rate and Rhythm: Normal rate and regular rhythm.  ?   Pulses: Normal pulses.  ?   Heart sounds: Normal heart sounds. No murmur heard. ?Pulmonary:  ?   Effort: Pulmonary effort is normal.  ?   Breath sounds: Normal breath sounds.  ?Abdominal:  ?   General: Abdomen is flat. Bowel sounds are normal.  ?   Palpations: Abdomen is soft.  ?Musculoskeletal:     ?   General: No swelling.  ?   Cervical back: Neck supple.  ?Skin: ?   General: Skin is warm.  ?Neurological:  ?   General: No focal deficit present.  ?   Mental Status: She is alert and oriented to person, place, and time.  ?Psychiatric:     ?   Mood and Affect: Mood normal.     ?   Thought Content: Thought content normal.  ? ? ?Labs reviewed: ?Basic Metabolic Panel: ?Recent Labs  ?  02/11/21 ?0000 05/10/21 ?0824 05/18/21 ?0000  ?NA 143 140 142  ?K 4.4 3.9 4.0  ?CL 105 104 105  ?CO2 26* 28 27*  ?GLUCOSE  --  92  --   ?BUN '17 13 16  '$ ?CREATININE 0.7 0.76 0.7  ?CALCIUM 9.8 9.8 10.3  ?TSH 2.99  --   --   ? ?Liver Function Tests: ?Recent Labs  ?  02/11/21 ?0000 05/10/21 ?9735  ?AST 19 19  ?ALT 17 13  ?ALKPHOS 60 46  ?BILITOT  --  0.7  ?PROT  --  6.4*   ?ALBUMIN 4.4 4.3  ? ?No results for input(s): LIPASE, AMYLASE in the last 8760 hours. ?No results for input(s): AMMONIA in the last 8760 hours. ?CBC: ?Recent Labs  ?  02/11/21 ?0000 05/10/21 ?0824 05/18/21

## 2021-10-07 ENCOUNTER — Ambulatory Visit: Payer: Medicare PPO | Admitting: Nurse Practitioner

## 2021-10-20 ENCOUNTER — Other Ambulatory Visit: Payer: Self-pay | Admitting: Hematology and Oncology

## 2021-10-20 DIAGNOSIS — I4819 Other persistent atrial fibrillation: Secondary | ICD-10-CM

## 2021-11-04 ENCOUNTER — Encounter (HOSPITAL_BASED_OUTPATIENT_CLINIC_OR_DEPARTMENT_OTHER): Payer: Self-pay | Admitting: Obstetrics and Gynecology

## 2021-11-04 ENCOUNTER — Emergency Department (HOSPITAL_BASED_OUTPATIENT_CLINIC_OR_DEPARTMENT_OTHER)
Admission: EM | Admit: 2021-11-04 | Discharge: 2021-11-04 | Disposition: A | Discharge status: Home / Self Care | Payer: Medicare PPO | Attending: Emergency Medicine | Admitting: Emergency Medicine

## 2021-11-04 ENCOUNTER — Emergency Department (HOSPITAL_BASED_OUTPATIENT_CLINIC_OR_DEPARTMENT_OTHER): Payer: Medicare PPO

## 2021-11-04 ENCOUNTER — Emergency Department (HOSPITAL_COMMUNITY): Payer: Medicare PPO

## 2021-11-04 ENCOUNTER — Other Ambulatory Visit: Payer: Self-pay

## 2021-11-04 DIAGNOSIS — Z91040 Latex allergy status: Secondary | ICD-10-CM | POA: Insufficient documentation

## 2021-11-04 DIAGNOSIS — Z853 Personal history of malignant neoplasm of breast: Secondary | ICD-10-CM | POA: Diagnosis not present

## 2021-11-04 DIAGNOSIS — Z7901 Long term (current) use of anticoagulants: Secondary | ICD-10-CM | POA: Insufficient documentation

## 2021-11-04 DIAGNOSIS — I10 Essential (primary) hypertension: Secondary | ICD-10-CM | POA: Diagnosis not present

## 2021-11-04 DIAGNOSIS — Z85828 Personal history of other malignant neoplasm of skin: Secondary | ICD-10-CM | POA: Insufficient documentation

## 2021-11-04 DIAGNOSIS — I639 Cerebral infarction, unspecified: Secondary | ICD-10-CM | POA: Diagnosis not present

## 2021-11-04 DIAGNOSIS — Z79899 Other long term (current) drug therapy: Secondary | ICD-10-CM | POA: Diagnosis not present

## 2021-11-04 DIAGNOSIS — R42 Dizziness and giddiness: Secondary | ICD-10-CM | POA: Diagnosis not present

## 2021-11-04 LAB — COMPREHENSIVE METABOLIC PANEL
ALT: 21 U/L (ref 0–44)
AST: 18 U/L (ref 15–41)
Albumin: 4.4 g/dL (ref 3.5–5.0)
Alkaline Phosphatase: 49 U/L (ref 38–126)
Anion gap: 6 (ref 5–15)
BUN: 17 mg/dL (ref 8–23)
CO2: 30 mmol/L (ref 22–32)
Calcium: 9.9 mg/dL (ref 8.9–10.3)
Chloride: 105 mmol/L (ref 98–111)
Creatinine, Ser: 0.71 mg/dL (ref 0.44–1.00)
GFR, Estimated: >60 mL/min (ref >60)
Glucose, Bld: 96 mg/dL (ref 70–99)
Potassium: 4.6 mmol/L (ref 3.5–5.1)
Sodium: 141 mmol/L (ref 135–145)
Total Bilirubin: 0.5 mg/dL (ref 0.3–1.2)
Total Protein: 6.2 g/dL — ABNORMAL LOW (ref 6.5–8.1)

## 2021-11-04 LAB — CBC
HCT: 43.8 % (ref 36.0–46.0)
Hemoglobin: 14.6 g/dL (ref 12.0–15.0)
MCH: 30.6 pg (ref 26.0–34.0)
MCHC: 33.3 g/dL (ref 30.0–36.0)
MCV: 91.8 fL (ref 80.0–100.0)
Platelets: 205 10*3/uL (ref 150–400)
RBC: 4.77 MIL/uL (ref 3.87–5.11)
RDW: 14.7 % (ref 11.5–15.5)
WBC: 6.8 10*3/uL (ref 4.0–10.5)
nRBC: 0 % (ref 0.0–0.2)

## 2021-11-04 LAB — APTT: aPTT: 34 seconds (ref 24–36)

## 2021-11-04 LAB — DIFFERENTIAL
Abs Immature Granulocytes: 0.01 10*3/uL (ref 0.00–0.07)
Basophils Absolute: 0 10*3/uL (ref 0.0–0.1)
Basophils Relative: 0 %
Eosinophils Absolute: 0.1 10*3/uL (ref 0.0–0.5)
Eosinophils Relative: 1 %
Immature Granulocytes: 0 %
Lymphocytes Relative: 25 %
Lymphs Abs: 1.7 10*3/uL (ref 0.7–4.0)
Monocytes Absolute: 0.5 10*3/uL (ref 0.1–1.0)
Monocytes Relative: 7 %
Neutro Abs: 4.5 10*3/uL (ref 1.7–7.7)
Neutrophils Relative %: 67 %

## 2021-11-04 LAB — CBG MONITORING, ED: Glucose-Capillary: 83 mg/dL (ref 70–99)

## 2021-11-04 LAB — PROTIME-INR
INR: 1.2 (ref 0.8–1.2)
Prothrombin Time: 14.9 seconds (ref 11.4–15.2)

## 2021-11-04 MED ORDER — SODIUM CHLORIDE 0.9% FLUSH
3.0000 mL | Freq: Once | INTRAVENOUS | Status: DC
  Filled 2021-11-04: qty 3

## 2021-11-04 NOTE — ED Triage Notes (Signed)
Patient reports to the ER for dizziness. Patient reports yesterday she started having some dizziness and it has continued since then. Patient reports she has had some difficulty walking. Patient reports her BP was high yesterday ?

## 2021-11-04 NOTE — ED Provider Notes (Signed)
5:15 PM-patient is here for brain MRI after consultation with hospitalist.  She presented earlier today at the freestanding ED for evaluation of disequilibrium.  This is ongoing for 2 days.  She has a known meningioma, which is being followed expectantly. ? ?7:05 PM-patient is comfortable.  No dysarthria or aphasia.  No ataxia.  Reviewed MRI brain results with neuro hospitalist.  Patient likely having symptomatic dizziness from known meningioma which is essentially unchanged.  She is stable for outpatient management by following up with her neurologist.  There is no indication for hospitalization or further interventions from the ED at this time. ?  ?Daleen Bo, MD ?11/04/21 2358 ? ?

## 2021-11-04 NOTE — ED Notes (Signed)
Report given to Charge RN.

## 2021-11-04 NOTE — ED Notes (Signed)
Report called to [facility] °

## 2021-11-04 NOTE — ED Notes (Signed)
This RN spoke to Jessica Mullins at [facility] her this patient is up for discharge. Per Minerva Fester, someone from transport will be leaving soon to pick up patient. Upon arrival to ED, transport will notify ED security and this RN will take patient to lobby via wheelchair. Patient currently waiting in room. ?

## 2021-11-04 NOTE — ED Provider Notes (Signed)
?[facility] EMERGENCY DEPT ?Provider Note ? ? ?CSN: [id number] ?Arrival date & time: 11/04/21  1045 ? ?  ? ?History ? ?Chief Complaint  ?Patient presents with  ? Dizziness  ? ? ?Jessica Mullins is a 88 y.o. female. ? ?HPI ? ?  ? ? ? ?When turn head to side will feel dizzy with meningioma ?Began about 2 days ago, then coming more than going ?Today was more pronounced than usual ?Nurse at [facility] recommended coming here ?Balance problem at baseline, uses walker because of that, balance problems similar.   ? ? ? ?Past Medical History:  ?Diagnosis Date  ? Allergy   ? Ankle fracture, left   ? Atrial fibrillation ([place])   ? Per [facility] New Patient Packet   ? Breast cancer ([place]) 1115/16  ? left   ? Bursitis of right shoulder   ? Cataract   ? Family history of cancer   ? Fracture of right wrist   ? GERD (gastroesophageal reflux disease)   ? Hyperlipidemia   ? Hypertension   ? Lumbar radiculitis   ? Per [facility] New Patient Packet   ? Meningioma ([place])   ? Per [facility] New Patient Packet   ? Skin cancer 2000  ? melanoma and basal cell  ? Stress fracture   ? Right Heel, Per [place] New Patient Packet   ? TIA (transient ischemic attack) 2022  ? Vaso vagal episode   ? during preparation for colonoscopy  ?  ? ?Home Medications ?Prior to Admission medications   ?Medication Sig Start Date End Date Taking? Authorizing Provider  ?acetaminophen (TYLENOL) 325 MG tablet Take 325 mg by mouth every 6 (six) hours as needed.   Yes [provider]  ?AMBULATORY NON FORMULARY MEDICATION Walker use daily as directed.  ?Disp 1 ?Leg pain M79.604 05/21/19  Yes Gregor Hams, MD  ?Biotin 5 MG CAPS Take 5 mg by mouth daily.    Yes [provider]  ?Calcium Carbonate-Vitamin D 600-400 MG-UNIT tablet Take 1 tablet by mouth daily.   Yes [provider]  ?Cholecalciferol (VITAMIN D3) 1000 UNITS CAPS Take 1,000 Units by mouth daily.   Yes [provider]  ?cycloSPORINE (RESTASIS) 0.05 % ophthalmic emulsion Place 1 drop  into both eyes 2 (two) times daily. 10/11/16  Yes [provider]  ?diltiazem (CARDIZEM) 30 MG tablet TAKE ONE TABLET BY MOUTH TWICE DAILY 10/04/21  Yes Croitoru, Mihai, MD  ?ELIQUIS 5 MG TABS tablet TAKE ONE TABLET BY MOUTH TWICE DAILY 10/20/21  Yes Causey, Charlestine Massed, NP  ?flecainide (TAMBOCOR) 50 MG tablet TAKE 1 TABLET BY MOUTH TWICE DAILY. 06/22/21  Yes Croitoru, Mihai, MD  ?Multiple Vitamins-Minerals (PRESERVISION AREDS) TABS Take 2 tablets by mouth daily.   Yes [provider]  ?Omega-3 Fatty Acids (FISH OIL PO) Take 1 tablet by mouth daily.    Yes [provider]  ?polyethylene glycol (MIRALAX / GLYCOLAX) 17 g packet Take 17 g by mouth daily.   Yes [provider]  ?pravastatin (PRAVACHOL) 40 MG tablet TAKE ONE TABLET BY MOUTH ONCE DAILY 05/17/21  Yes Virgie Dad, MD  ?Psyllium (METAMUCIL PO) Take 1 Dose by mouth daily. 1 teaspoon   Yes [provider]  ?Turmeric 500 MG CAPS Take 500 capsules by mouth daily.   Yes [provider]  ?vitamin B-12 (CYANOCOBALAMIN) 1000 MCG tablet Take 1 tablet (1,000 mcg total) by mouth daily. 07/19/18  Yes Nicholas Lose, MD  ?ketoconazole (NIZORAL) 2 % cream APPLY TO  AFFECTED AREA ONCE DAILY AS NEEDED FOR ITCHING. ?Patient taking differently: Apply 1 application. topically daily as needed for irritation. 03/04/21   Virgie Dad, MD  ?   ? ?Allergies    ?Sulfa antibiotics, Tape, Glucosamine forte [nutritional supplements], and Latex   ? ?Review of Systems   ?Review of Systems ? ?Physical Exam ?Updated Vital Signs ?BP 138/78 (BP Location: Right Arm)   Pulse 70   Temp 97.8 ?F (36.6 ?C) (Oral)   Resp 16   Ht 5' (1.524 m)   Wt 68 kg   SpO2 100%   BMI 29.29 kg/m?  ?Physical Exam ?Vitals and nursing note reviewed.  ?Constitutional:   ?   General: She is not in acute distress. ?   Appearance: She is well-developed. She is not diaphoretic.  ?HENT:  ?   Head: Normocephalic and atraumatic.  ?Eyes:  ?    Conjunctiva/sclera: Conjunctivae normal.  ?Cardiovascular:  ?   Rate and Rhythm: Normal rate and regular rhythm.  ?   Heart sounds: Normal heart sounds. No murmur heard. ?  No friction rub. No gallop.  ?Pulmonary:  ?   Effort: Pulmonary effort is normal. No respiratory distress.  ?   Breath sounds: Normal breath sounds. No wheezing or rales.  ?Abdominal:  ?   General: There is no distension.  ?   Palpations: Abdomen is soft.  ?   Tenderness: There is no abdominal tenderness. There is no guarding.  ?Musculoskeletal:     ?   General: No tenderness.  ?   Cervical back: Normal range of motion.  ?Skin: ?   General: Skin is warm and dry.  ?   Findings: No erythema or rash.  ?Neurological:  ?   Mental Status: She is alert and oriented to person, place, and time.  ? ? ?ED Results / Procedures / Treatments   ?Labs ?(all labs ordered are listed, but only abnormal results are displayed) ?Labs Reviewed  ?COMPREHENSIVE METABOLIC PANEL - Abnormal; Notable for the following components:  ?    Result Value  ? Total Protein 6.2 (*)   ? All other components within normal limits  ?PROTIME-INR  ?APTT  ?CBC  ?DIFFERENTIAL  ?CBG MONITORING, ED  ? ? ?EKG ?EKG Interpretation ? ?Date/Time:  Thursday Nov 04 2021 11:34:55 EDT ?Ventricular Rate:  51 ?PR Interval:  170 ?QRS Duration: 90 ?QT Interval:  404 ?QTC Calculation: 372 ?R Axis:   -31 ?Text Interpretation: Sinus bradycardia Left axis deviation Cannot rule out Anterior infarct , age undetermined Abnormal ECG When compared with ECG of 10-May-2021 08:02, No significant change since last tracing Confirmed by Gareth Morgan [phone]) on 11/04/2021 2:38:13 PM ? ?Radiology ?CT HEAD WO CONTRAST ? ?Result Date: 11/04/2021 ?CLINICAL DATA:  Dizziness EXAM: CT HEAD WITHOUT CONTRAST TECHNIQUE: Contiguous axial images were obtained from the base of the skull through the vertex without intravenous contrast. RADIATION DOSE REDUCTION: This exam was performed according to the departmental dose-optimization  program which includes automated exposure control, adjustment of the mA and/or kV according to patient size and/or use of iterative reconstruction technique. COMPARISON:  05/10/2021 FINDINGS: Brain: No acute intracranial findings are seen. There are no signs of bleeding within the cranium. There is 2.9 x 2.5 cm soft tissue mass in the anterior aspect of posterior cranial fossa at the level of brainstem causing extrinsic pressure over the medulla and displacing the medulla to the left and posterior. This lesion has not changed significantly since 05/10/2021. Vascular: Unremarkable. Skull: Unremarkable. Sinuses/Orbits: Unremarkable.  Other: No significant interval changes are noted. IMPRESSION: No acute intracranial findings are seen in noncontrast CT brain. There is 2.9 cm smooth marginated soft tissue mass in the anterior inferior aspect of posterior cranial fossa on the right side causing extrinsic pressure over the brainstem. This may suggest meningioma. This finding has not changed significantly. Electronically Signed   By: Elmer Picker M.D.   On: 11/04/2021 12:17  ? ?MR BRAIN WO CONTRAST ? ?Result Date: 11/04/2021 ?CLINICAL DATA:  Stroke follow-up. EXAM: MRI HEAD WITHOUT CONTRAST TECHNIQUE: Multiplanar, multiecho pulse sequences of the brain and surrounding structures were obtained without intravenous contrast. COMPARISON:  CT head 11/04/2021.  MRI head with contrast 05/10/2021 FINDINGS: Brain: Negative for acute infarct. Mild to moderate chronic microvascular ischemic change in the white matter. No intracranial hemorrhage Extra-axial mass lesion in the posterior fossa on the right. This appears to be attached to the clivus on the right and measures approximately 2.9 x 2.1 cm, unchanged from the prior MRI. There is significant mass-effect on the brainstem which is flattened and displaced to the left. There appears to be tumor in the hypoglossal canal on the right as noted on the contrast enhanced MRI. No  change in size of the mass. There is dural thickening along the clivus similar to the prior MRI. Vascular: Normal arterial flow voids at the skull base. Skull and upper cervical spine: No focal skull lesion. Sinuses/Orbits: Editor, commissioning

## 2021-11-04 NOTE — ED Provider Notes (Signed)
5:15 PM-patient is here for brain MRI after consultation with hospitalist.  She presented earlier today at the freestanding ED for evaluation of disequilibrium.  This is ongoing for 2 days.  She has a known meningioma, which is being followed expectantly. ? ?6:55 PM-patient awake and alert.  She now states that she is asymptomatic.  She has previously had similar symptoms, on and off for "a long time."  She last saw her neurologist, on 06/04/2021, to follow-up on a hospitalization for TIA versus MRI negative infarct.  At that time she had a stable meningioma.  MRI today indicates stable meningioma without CVA.  The meningioma is likely causing intermittent equilibrium trouble.  She does not require hospitalization at this time.  She ambulates with a rolling walker.  She makes her own medical decisions.  I offered to call family members and talk to them and she declined that.  She stated that she would call them when she gets home.  Patient appears lucid and capable of making decisions. ?  ?Daleen Bo, MD ?11/04/21 1908 ? ?

## 2021-11-04 NOTE — ED Notes (Signed)
Spoke to MD about trending  BP; permissive HTN at this time ?

## 2021-11-04 NOTE — ED Notes (Signed)
Pt reports mild headache but states she is doing ok ?

## 2021-11-04 NOTE — ED Triage Notes (Signed)
Pt bib [facility] from [facility] for MRI. Pt arrives complaining of weakness and feeling light headed. Pt was hypertensive with EMS at 204 SBP. Denies pain at this time ?

## 2021-11-04 NOTE — Discharge Instructions (Signed)
The MRI does not show acute changes of your meningioma.  It is however likely causing you periods of dizziness.  The MRI today did not show any changes in the meningioma and no sign of stroke.  Continue taking your usual medicines.  Always use your walker when ambulating.  Make sure you are getting plenty of rest, eating and drinking well.  Take all of your medicines as prescribed. ?

## 2021-11-05 ENCOUNTER — Telehealth: Payer: Self-pay | Admitting: Neurology

## 2021-11-05 NOTE — Telephone Encounter (Signed)
Patient called and said she was seen in the ED yesterday and advised to follow up with Dr. Tomi Likens. ? ?Routing to clinical staff for review and to advise on scheduling urgency and whether to work in. ?

## 2021-11-05 NOTE — Telephone Encounter (Signed)
Patient seen for Dizziness, Per note patient alert and able to speak on her own walk without assistance. ?Patient discharged from hospital.  ?

## 2021-11-05 NOTE — Telephone Encounter (Signed)
She may follow up.  However I really don't think it is urgent as episodic dizziness is not new and she has a known reason for it.  Often the ED will automatically tell patients to follow up regardless.    ? ?Patient advised.  ?

## 2021-11-19 ENCOUNTER — Other Ambulatory Visit: Payer: Self-pay | Admitting: Adult Health

## 2021-11-19 DIAGNOSIS — I4819 Other persistent atrial fibrillation: Secondary | ICD-10-CM

## 2021-11-27 ENCOUNTER — Other Ambulatory Visit: Payer: Self-pay | Admitting: Internal Medicine

## 2021-11-27 DIAGNOSIS — E785 Hyperlipidemia, unspecified: Secondary | ICD-10-CM

## 2021-12-14 ENCOUNTER — Telehealth: Payer: Self-pay | Admitting: Cardiovascular Disease

## 2021-12-14 ENCOUNTER — Ambulatory Visit (INDEPENDENT_AMBULATORY_CARE_PROVIDER_SITE_OTHER): Payer: Medicare PPO

## 2021-12-14 DIAGNOSIS — R001 Bradycardia, unspecified: Secondary | ICD-10-CM

## 2021-12-14 NOTE — Progress Notes (Unsigned)
Enrolled for [employer] to mail a ZIO AT Live Telemetry monitor to patients address on file.  

## 2021-12-14 NOTE — Telephone Encounter (Signed)
Spoke with patient -   she states she went to health center at  [facility] -  She states  she felt little dizzy , blurry vision occasional but not now. Patient states the nurse checked her blood pressure today was 128/60 with pulse rate of 39.  Patient states she went yesterday to have blood pressure and pulse done ,but does not remember the numbers.     RN obtain phone number from patient and contact [facility] - spoke to  Saline . Leafy Ro states she did the patient blood pressure and heart rate.   Today s blood pressure  128/60 heart rate 39. She states she rechecked  heart rate and it was 39 as well.  RN as if she can look up yesterdays  reading.   12/13/21  reading was 122/76 pulse rate 52.    RN reviewed with Dr Gwenlyn Found  ( D.O.D)  -  patient has history of bradycardia. No  beta blocker  in usage. Patient has not worn a monitor since 2021.   Per verbal order  from Dr Gwenlyn Found -  14 day Zio  monitor  and follow up with Dr Sallyanne Kuster afterwards.

## 2021-12-14 NOTE — Telephone Encounter (Signed)
STAT if patient feels like he/she is going to faint   Are you dizzy now?           Yes  Do you feel faint or have you passed out?               Moving slowly so she won't pass out  Do you have any other symptoms?     Blurry vision every once in a while  Have you checked your HR and BP (record if available)?            Current 128/60, HR 39

## 2021-12-14 NOTE — Telephone Encounter (Signed)
Spoke to patient . Informed patient - - Doctor of the day would like for her for wear  2 week  Zio monitor and follow up with Dr Sallyanne Kuster.   Alive 2 week monitor ordered . Appointment schedule for Dr Sallyanne Kuster on Jul 25 , 2023.  Patient is aware. Patient states she believes she can place the monitor on without assistance .  The Zio Monitor 14 day  will be mailed to her apartment at  [facility]

## 2021-12-15 DIAGNOSIS — Z85828 Personal history of other malignant neoplasm of skin: Secondary | ICD-10-CM | POA: Diagnosis not present

## 2021-12-15 DIAGNOSIS — L814 Other melanin hyperpigmentation: Secondary | ICD-10-CM | POA: Diagnosis not present

## 2021-12-15 DIAGNOSIS — Z85820 Personal history of malignant melanoma of skin: Secondary | ICD-10-CM | POA: Diagnosis not present

## 2021-12-15 DIAGNOSIS — L570 Actinic keratosis: Secondary | ICD-10-CM | POA: Diagnosis not present

## 2021-12-15 DIAGNOSIS — D1801 Hemangioma of skin and subcutaneous tissue: Secondary | ICD-10-CM | POA: Diagnosis not present

## 2021-12-15 DIAGNOSIS — L821 Other seborrheic keratosis: Secondary | ICD-10-CM | POA: Diagnosis not present

## 2021-12-17 ENCOUNTER — Telehealth: Payer: Self-pay | Admitting: Cardiovascular Disease

## 2021-12-17 ENCOUNTER — Encounter: Payer: Self-pay | Admitting: Adult Health

## 2021-12-17 DIAGNOSIS — R001 Bradycardia, unspecified: Secondary | ICD-10-CM | POA: Diagnosis not present

## 2021-12-17 NOTE — Telephone Encounter (Signed)
Pt states that she was told to call the office and inform us that she is holding Diltiazem until her appt with Dr. Royann Shivers.

## 2021-12-18 DIAGNOSIS — R001 Bradycardia, unspecified: Secondary | ICD-10-CM | POA: Diagnosis not present

## 2021-12-20 ENCOUNTER — Telehealth: Payer: Self-pay | Admitting: Cardiovascular Disease

## 2021-12-20 ENCOUNTER — Non-Acute Institutional Stay: Payer: Medicare PPO | Admitting: Adult Health

## 2021-12-20 ENCOUNTER — Encounter: Payer: Self-pay | Admitting: Adult Health

## 2021-12-20 VITALS — BP 124/76 | HR 57 | Temp 98.1°F | Ht 60.0 in | Wt 154.0 lb

## 2021-12-20 DIAGNOSIS — R001 Bradycardia, unspecified: Secondary | ICD-10-CM | POA: Diagnosis not present

## 2021-12-20 DIAGNOSIS — R051 Acute cough: Secondary | ICD-10-CM | POA: Diagnosis not present

## 2021-12-20 NOTE — Telephone Encounter (Signed)
Patient is calling back stating she was advised by [facility] to call and see when Dr. Royann Shivers wants her to start back on this medication. Please advise.

## 2021-12-21 ENCOUNTER — Encounter: Payer: Self-pay | Admitting: Adult Health

## 2022-01-06 ENCOUNTER — Encounter: Payer: Self-pay | Admitting: Cardiovascular Disease

## 2022-01-06 ENCOUNTER — Ambulatory Visit: Payer: Medicare PPO | Admitting: Cardiovascular Disease

## 2022-01-06 VITALS — BP 130/80 | HR 67 | Ht 60.0 in | Wt 148.8 lb

## 2022-01-06 DIAGNOSIS — I10 Essential (primary) hypertension: Secondary | ICD-10-CM | POA: Diagnosis not present

## 2022-01-06 DIAGNOSIS — E7800 Pure hypercholesterolemia, unspecified: Secondary | ICD-10-CM | POA: Diagnosis not present

## 2022-01-06 DIAGNOSIS — Z8673 Personal history of transient ischemic attack (TIA), and cerebral infarction without residual deficits: Secondary | ICD-10-CM | POA: Diagnosis not present

## 2022-01-06 DIAGNOSIS — Z79899 Other long term (current) drug therapy: Secondary | ICD-10-CM

## 2022-01-06 DIAGNOSIS — I495 Sick sinus syndrome: Secondary | ICD-10-CM | POA: Diagnosis not present

## 2022-01-06 DIAGNOSIS — I480 Paroxysmal atrial fibrillation: Secondary | ICD-10-CM

## 2022-01-06 DIAGNOSIS — D6869 Other thrombophilia: Secondary | ICD-10-CM

## 2022-01-06 DIAGNOSIS — Z5181 Encounter for therapeutic drug level monitoring: Secondary | ICD-10-CM | POA: Diagnosis not present

## 2022-01-06 LAB — BASIC METABOLIC PANEL
BUN/Creatinine Ratio: 21 (ref 12–28)
BUN: 16 mg/dL (ref 8–27)
CO2: 28 mmol/L (ref 20–29)
Calcium: 10.1 mg/dL (ref 8.7–10.3)
Chloride: 103 mmol/L (ref 96–106)
Creatinine, Ser: 0.77 mg/dL (ref 0.57–1.00)
Glucose: 87 mg/dL (ref 70–99)
Potassium: 4.7 mmol/L (ref 3.5–5.2)
Sodium: 142 mmol/L (ref 134–144)
eGFR: 74 mL/min/{1.73_m2} (ref >59)

## 2022-01-06 LAB — CBC
Hematocrit: 44.5 % (ref 34.0–46.6)
Hemoglobin: 14.8 g/dL (ref 11.1–15.9)
MCH: 30.5 pg (ref 26.6–33.0)
MCHC: 33.3 g/dL (ref 31.5–35.7)
MCV: 92 fL (ref 79–97)
Platelets: 226 10*3/uL (ref 150–450)
RBC: 4.86 x10E6/uL (ref 3.77–5.28)
RDW: 13.4 % (ref 11.7–15.4)
WBC: 7.6 10*3/uL (ref 3.4–10.8)

## 2022-01-06 NOTE — Progress Notes (Signed)
Cardiology Office Note:    Date:  01/06/2022   ID:  Briasia L Marcon, DOB [birth year], MRN [id number]  PCP:  Virgie Dad, MD  [facility] Cardiologist:  Sanda Klein, MD  [place] Electrophysiologist:  None   Referring MD: Virgie Dad, MD   No chief complaint on file.    History of Present Illness:    Jessica Mullins is a 88 y.o. female with a hx of paroxysmal atrial fibrillation without significant structural heart disease, remote history of vasovagal syncope, history of stroke 2022, cerebral meningioma.   In November 2022 she developed problems with visual changes eventually with severe diplopia.  MRI of the brain showed a small punctate acute left parietal infarction, not an area that would be expected to contribute to visual changes.  CT angiogram of the head and neck did not show any large vessel stenoses and there was mild for age atherosclerosis in the intracranial vessels without significant stenoses.  The echocardiogram showed unchanged findings with normal LV systolic function LA dilated left atrium in the absence of hemodynamically significant valvular abnormalities (significant mitral annular calcification with caseous change reported).  Her symptoms have resolved.  She was seen in the emergency room for problems with her balance in early May.  She was in sinus bradycardia at 51 bpm.    She called on 12/14/2021 with complaints of dizziness and blurry vision.  Her heart rate was reportedly 39.  She has been feeling worse now for about a couple of weeks and associates this with irregular palpitations.  She has been feeling very weak, lacking energy.  She "feels drained".  It is also affected her balance which is much less steady.  We placed an event monitor to confirm that she is in atrial fibrillation but also showed periods of marked slow ventricular response.  We asked her to stop taking her diltiazem.  She presents today in atrial fibrillation with a  ventricular rate of only 67 bpm, despite not taking any rate control medication.  She is still on flecainide.  QRS complex is narrow and QTc is normal.  She feels partially improved after stopping the diltiazem.  Her previous event monitor October 2021 did show a tendency to bradycardia with heart rates frequently in the 50s and borderline criteria for chronotropic incompetence.  Her average heart rate was 64.  The maximum achieved heart rate and sinus rhythm was 103 (75% of predicted for age).  Most recent metabolic parameters include LDL of 70, HDL 59 and hemoglobin A1c of 4.8%.  Most recent creatinine was normal at 0.71 and potassium 4.6.  She has been compliant with anticoagulation without any interruption.  She denies any bleeding complications.  She has had unsteady gait ever since her A-fib onset but has not had serious injuries.  One day she was trying to straighten out to the cover on her arm chair and fell forward smacking her face on the back of the chair, but without serious consequences.  Past Medical History:  Diagnosis Date   Allergy    Ankle fracture, left    Atrial fibrillation [facility])    Per [facility] New Patient Packet    Breast cancer ([place]) 1115/16   left    Bursitis of right shoulder    Cataract    Family history of cancer    Fracture of right wrist    GERD (gastroesophageal reflux disease)    Hyperlipidemia    Hypertension    Lumbar radiculitis  Per [place] New Patient Packet    Meningioma [facility])    Per [facility] New Patient Packet    Skin cancer 2000   melanoma and basal cell   Stress fracture    Right Heel, Per [facility] New Patient Packet    TIA (transient ischemic attack) 2022   Vaso vagal episode    during preparation for colonoscopy    Past Surgical History:  Procedure Laterality Date   ABDOMINAL HYSTERECTOMY  1988   TAH/BSO--FIBROIDS   APPENDECTOMY     BREAST LUMPECTOMY     B/L--FCS   BREAST LUMPECTOMY WITH RADIOACTIVE SEED AND SENTINEL LYMPH NODE BIOPSY Left  06/11/2015   Procedure: LEFT BREAST LUMPECTOMY WITH RADIOACTIVE SEED AND LEFT SENTINEL LYMPH NODE MAPPING;  Surgeon: Erroll Luna, MD;  Location: [place];  Service: General;  Laterality: Left;   CATARACT EXTRACTION  2015   COLONOSCOPY  2010   DG  BONE DENSITY ([place] [facility])     EYE SURGERY Bilateral    cataracts   fiberadenoma Bilateral 1978, 1980   GANGLION CYST EXCISION     L  hand   Lipiflow procedure      Current Medications: Current Meds  Medication Sig   acetaminophen (TYLENOL) 325 MG tablet Take 325 mg by mouth every 6 (six) hours as needed.   AMBULATORY NON FORMULARY MEDICATION Walker use daily as directed.  Disp 1 Leg pain M79.604   Biotin 5 MG CAPS Take 5 mg by mouth daily.    Calcium Carbonate-Vitamin D 600-400 MG-UNIT tablet Take 1 tablet by mouth daily.   Cholecalciferol (VITAMIN D3) 1000 UNITS CAPS Take 1,000 Units by mouth daily.   cycloSPORINE (RESTASIS) 0.05 % ophthalmic emulsion Place 1 drop into both eyes 2 (two) times daily.   ELIQUIS 5 MG TABS tablet TAKE ONE TABLET BY MOUTH TWICE DAILY   flecainide (TAMBOCOR) 50 MG tablet TAKE 1 TABLET BY MOUTH TWICE DAILY.   ketoconazole (NIZORAL) 2 % cream APPLY TO AFFECTED AREA ONCE DAILY AS NEEDED FOR ITCHING. (Patient taking differently: Apply 1 application  topically daily as needed for irritation.)   Multiple Vitamins-Minerals (PRESERVISION AREDS) TABS Take 2 tablets by mouth daily.   Omega-3 Fatty Acids (FISH OIL PO) Take 1 tablet by mouth daily.    polyethylene glycol (MIRALAX / GLYCOLAX) 17 g packet Take 17 g by mouth daily.   pravastatin (PRAVACHOL) 40 MG tablet TAKE ONE TABLET BY MOUTH ONCE DAILY   Psyllium (METAMUCIL PO) Take 1 Dose by mouth daily. 1 teaspoon   Turmeric 500 MG CAPS Take 500 capsules by mouth daily.   vitamin B-12 (CYANOCOBALAMIN) 1000 MCG tablet Take 1 tablet (1,000 mcg total) by mouth daily.     Allergies:   Sulfa antibiotics, Tape, Glucosamine forte [nutritional supplements], and Latex   Social  History   Socioeconomic History   Marital status: Widowed    Spouse name: Not on file   Number of children: 2   Years of education: Not on file   Highest education level: Master's degree (e.g., MA, MS, MEng, MEd, [name], [name])  Occupational History   Occupation: Product manager: RETIRED    Comment: retired  Tobacco Use   Smoking status: Never   Smokeless tobacco: Never  Vaping Use   Vaping Use: Never used  Substance and Sexual Activity   Alcohol use: Yes    Comment: rare   Drug use: No   Sexual activity: Never  Other Topics Concern   Not on file  Social History  Narrative   Patient is widowed.  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    One son one daughter   1 caffeinated beverage daily   She is right-handed.    She works out everyday to a televised [employer] 30 minute program.   No tobacco or alcohol         Per [facility] New Patient Packet Abstracted on 01/03/2020      Diet: Left blank       Caffeine: Yes      Married, if yes what year: Widowed, married in [place] you live in a house, apartment, assisted living, condo, trailer, ect: Apartment      Is it one or more stories: One stories, one person       Pets: 1 Neurosurgeon      Current/Past profession: Pharmacist, hospital      Highest level or education completed: MA of Education       Exercise:   Yes             Type and how often: Cardio, chair aerobics, 3-4 times weekly          Living Will: Yes   DNR: Yes   POA/HPOA: Yes      Functional Status:   Do you have difficulty bathing or dressing yourself? Left blank   Do you have difficulty preparing food or eating? Left blank   Do you have difficulty managing your medications? Left blank   Do you have difficulty managing your finances? Left blank   Do you have difficulty affording your medications? Left blank   Social Determinants of Health   Financial Resource Strain: Low Risk  (11/14/2019)   Overall Financial Resource Strain (CARDIA)    Difficulty of Paying  Living Expenses: Not hard at all  Food Insecurity: No Food Insecurity (11/14/2019)   Hunger Vital Sign    Worried About Running Out of Food in the Last Year: Never true    Ran Out of Food in the Last Year: Never true  Transportation Needs: No Transportation Needs (11/14/2019)   PRAPARE - Hydrologist (Medical): No    Lack of Transportation (Non-Medical): No  Physical Activity: Not on file  Stress: Not on file  Social Connections: Not on file     Family History: The patient's family history includes Cancer in her mother; Coronary artery disease in her brother; Diabetes in her brother and maternal aunt; Heart disease in her brother; Heart disease (age of onset: 109) in her father; High Cholesterol in her son; Hyperlipidemia in her brother; Hypertension in her brother and son; Lung cancer in an other family member; Lung cancer (age of onset: 2) in her sister; Thrombosis in an other family member.  ROS:   Please see the history of present illness.     All other systems reviewed and are negative.  EKGs/Labs/Other Studies Reviewed:    The following studies were reviewed today: Event monitor 09/02-09/15  ECHO 05/11/2021   1. A small pericardial effusion is present. The pericardial effusion is  anterior to the right ventricle. There is no RV or RA collapse suggestive  of increased pericardial pressures.   2. The inferior vena cava is normal in size with greater than 50%  respiratory variability, suggesting right atrial pressure of 3 mmHg.   3. Caseous mitral annular calcifification noted. The mitral valve is  abnormal. No evidence of mitral valve regurgitation. No evidence of  mitral  stenosis.   4. Left ventricular ejection fraction, by estimation, is 55 to 60%. The  left ventricle has normal function. The left ventricle has no regional  wall motion abnormalities. There is mild concentric left ventricular  hypertrophy. Left ventricular diastolic  parameters  are consistent with Grade II diastolic dysfunction  (pseudonormalization).   5. Right ventricular systolic function is normal. The right ventricular  size is normal. There is normal pulmonary artery systolic pressure.   6. Left atrial size was mildly dilated.   7. The aortic valve was not well visualized. Aortic valve regurgitation  is not visualized. No aortic stenosis is present.   Comparison(s): Prior images unable to be directly viewed, comparison made  by report only. Similar from prior report.   EKG:  EKG is not ordered today.  ECG from 05/10/2021 personally reviewed, shows sinus rhythmECG, T wave inversion in the anterolateral precordial leads.  There is left axis deviation that does not quite meet criteria for left anterior fascicular block.  QRS 102 ms QTC 424 ms  Recent Labs: 02/11/2021: TSH 2.99 11/04/2021: ALT 21; BUN 17; Creatinine, Ser 0.71; Hemoglobin 14.6; Platelets 205; Potassium 4.6; Sodium 141  Recent Lipid Panel    Component Value Date/Time   CHOL 146 05/11/2021 0128   TRIG 86 05/11/2021 0128   HDL 59 05/11/2021 0128   CHOLHDL 2.5 05/11/2021 0128   VLDL 17 05/11/2021 0128   LDLCALC 70 05/11/2021 0128   LDLDIRECT 153.4 11/16/2009 0000    Physical Exam:    VS:  BP 130/80 (BP Location: Left Arm, Patient Position: Sitting, Cuff Size: Normal)   Pulse 67   Ht 5' (1.524 m)   Wt 148 lb 12.8 oz (67.5 kg)   SpO2 95%   BMI 29.06 kg/m     Wt Readings from Last 3 Encounters:  01/06/22 148 lb 12.8 oz (67.5 kg)  12/20/21 154 lb (69.9 kg)  11/04/21 150 lb (68 kg)      General: Alert, oriented x3, no distress, overweight Head: no evidence of trauma, PERRL, EOMI, no exophtalmos or lid lag, no myxedema, no xanthelasma; normal ears, nose and oropharynx Neck: normal jugular venous pulsations and no hepatojugular reflux; brisk carotid pulses without delay and no carotid bruits Chest: clear to auscultation, no signs of consolidation by percussion or palpation, normal  fremitus, symmetrical and full respiratory excursions Cardiovascular: normal position and quality of the apical impulse, irregular rhythm, normal first and second heart sounds, no murmurs, rubs or gallops Abdomen: no tenderness or distention, no masses by palpation, no abnormal pulsatility or arterial bruits, normal bowel sounds, no hepatosplenomegaly Extremities: no clubbing, cyanosis or edema; 2+ radial, ulnar and brachial pulses bilaterally; 2+ right femoral, posterior tibial and dorsalis pedis pulses; 2+ left femoral, posterior tibial and dorsalis pedis pulses; no subclavian or femoral bruits Neurological: grossly nonfocal Psych: Normal mood and affect    ASSESSMENT:    1. Paroxysmal atrial fibrillation (HCC)   2. SSS (sick sinus syndrome) ([place])   3. Encounter for monitoring flecainide therapy   4. Acquired thrombophilia ([place])   5. Essential hypertension   6. History of ischemic stroke   7. Hypercholesterolemia      PLAN:    In order of problems listed above:  AFib: This is her first meaningful episode of atrial fibrillation breakthrough since starting flecainide a couple of years ago.  Last year she was in sinus rhythm and had to decrease the dose of diltiazem due to bradycardia.  She had evidence of  chronotropic incompetence but wished to delay pacemaker discussion.  She now has atrial fibrillation with slow ventricular response even after discontinuing the diltiazem.  She is quite symptomatic due to the arrhythmia, rather than a slow heartbeat.  We will schedule for cardioversion with deep sedation by anesthesiology. This procedure has been fully reviewed with the patient and written informed consent has been obtained.  If she has early recurrence of atrial fibrillation options include switching to dofetilide or amiodarone.  Either 1 of these would be likely to precipitate the need for pacemaker implantation. SSS/AV conduction abnormalities: It is very likely that she will need a  pacemaker in the near future regardless of the results of the cardioversion.  We discussed this briefly today.  In the meantime, avoid all medications with negative chronotropic effect including beta-blockers and centrally acting calcium channel blockers. Flecainide: No evidence of toxicity to date.  Has worked pretty well to maintain sinus rhythm until just the past few weeks. Anticoagulation: Has not had any interruptions in medication in the last 4 weeks.  No actual falls, no bleeding complications. HTN: Well-controlled.  History of orthostatic hypotension. History of CVA: Demonstrated on CT, but not really correlating with other neuro symptoms.  Distribution is not typical for embolic events. HLP: All lipid parameters in desirable range on the current medications.  Medication Adjustments/Labs and Tests Ordered: Current medicines are reviewed at length with the patient today.  Concerns regarding medicines are outlined above.  Orders Placed This Encounter  Procedures   Basic metabolic panel   CBC   EKG 12-Lead    No orders of the defined types were placed in this encounter.    Patient Instructions  Medication Instructions:  No changes *If you need a refill on your cardiac medications before your next appointment, please call your pharmacy*   Lab Work: Your provider would like for you to have the following labs today: CBC and BMET  If you have labs (blood work) drawn today and your tests are completely normal, you will receive your results only by: MyChart Message (if you have MyChart) OR A paper copy in the mail If you have any lab test that is abnormal or we need to change your treatment, we will call you to review the results.  Follow-Up: At [facility], you and your health needs are our priority.  As part of our continuing mission to provide you with exceptional heart care, we have created designated Provider Care Teams.  These Care Teams include your primary Cardiologist  (physician) and Advanced Practice Providers (APPs -  Physician Assistants and Nurse Practitioners) who all work together to provide you with the care you need, when you need it.  We recommend signing up for the patient portal called "MyChart".  Sign up information is provided on this After Visit Summary.  MyChart is used to connect with patients for Virtual Visits (Telemedicine).  Patients are able to view lab/test results, encounter notes, upcoming appointments, etc.  Non-urgent messages can be sent to your provider as well.   To learn more about what you can do with MyChart, go to [email or web address].    Your next appointment:   3 month(s)  The format for your next appointment:   In Person  Provider:   Sanda Klein, MD {    Other Instructions  You are scheduled for a Cardioversion on 01/11/22 with Dr. Harrell Gave.  Please arrive at the [facility] (Main Entrance A) at [facility]: [place], [place]  [id number] at 10:30 am. (1 hour prior to procedure)  DIET: Nothing to eat or drink after midnight except a sip of water with medications (see medication instructions below)  FYI: For your safety, and to allow [place] to monitor your vital signs accurately during the surgery/procedure we request that   if you have artificial nails, gel coating, SNS etc. Please have those removed prior to your surgery/procedure. Not having the nail coverings /polish removed may result in cancellation or delay of your surgery/procedure.   Medication Instructions: Hold nothing to hold  Continue your anticoagulant: Eliquis You will need to continue your anticoagulant after your procedure until you  are told by your provider that it is safe to stop   Labs: labs completed today  You must have a responsible person to drive you home and stay in the waiting area during your procedure. Failure to do so could result in cancellation.  Bring your insurance cards.  *Special Note: Every effort  is made to have your procedure done on time. Occasionally there are emergencies that occur at the hospital that may cause delays. Please be patient if a delay does occur.        Signed, Sanda Klein, MD  01/06/2022 2:03 PM    [place]

## 2022-01-06 NOTE — H&P (View-Only) (Signed)
Cardiology Office Note:    Date:  01/06/2022   ID:  Jessica Mullins, DOB 05-21-1931, MRN [id number]  PCP:  Virgie Dad, MD  [facility] Cardiologist:  Sanda Klein, MD  [place] Electrophysiologist:  None   Referring MD: Virgie Dad, MD   No chief complaint on file.    History of Present Illness:    Jessica Mullins is a 88 y.o. female with a hx of paroxysmal atrial fibrillation without significant structural heart disease, remote history of vasovagal syncope, history of stroke 2022, cerebral meningioma.   In November 2022 she developed problems with visual changes eventually with severe diplopia.  MRI of the brain showed a small punctate acute left parietal infarction, not an area that would be expected to contribute to visual changes.  CT angiogram of the head and neck did not show any large vessel stenoses and there was mild for age atherosclerosis in the intracranial vessels without significant stenoses.  The echocardiogram showed unchanged findings with normal LV systolic function LA dilated left atrium in the absence of hemodynamically significant valvular abnormalities (significant mitral annular calcification with caseous change reported).  Her symptoms have resolved.  She was seen in the emergency room for problems with her balance in early May.  She was in sinus bradycardia at 51 bpm.    She called on 12/14/2021 with complaints of dizziness and blurry vision.  Her heart rate was reportedly 39.  She has been feeling worse now for about a couple of weeks and associates this with irregular palpitations.  She has been feeling very weak, lacking energy.  She "feels drained".  It is also affected her balance which is much less steady.  We placed an event monitor to confirm that she is in atrial fibrillation but also showed periods of marked slow ventricular response.  We asked her to stop taking her diltiazem.  She presents today in atrial fibrillation with a  ventricular rate of only 67 bpm, despite not taking any rate control medication.  She is still on flecainide.  QRS complex is narrow and QTc is normal.  She feels partially improved after stopping the diltiazem.  Her previous event monitor October 2021 did show a tendency to bradycardia with heart rates frequently in the 50s and borderline criteria for chronotropic incompetence.  Her average heart rate was 64.  The maximum achieved heart rate and sinus rhythm was 103 (75% of predicted for age).  Most recent metabolic parameters include LDL of 70, HDL 59 and hemoglobin A1c of 4.8%.  Most recent creatinine was normal at 0.71 and potassium 4.6.  She has been compliant with anticoagulation without any interruption.  She denies any bleeding complications.  She has had unsteady gait ever since her A-fib onset but has not had serious injuries.  One day she was trying to straighten out to the cover on her arm chair and fell forward smacking her face on the back of the chair, but without serious consequences.  Past Medical History:  Diagnosis Date   Allergy    Ankle fracture, left    Atrial fibrillation [facility])    Per [facility] New Patient Packet    Breast cancer ([place]) 1115/16   left    Bursitis of right shoulder    Cataract    Family history of cancer    Fracture of right wrist    GERD (gastroesophageal reflux disease)    Hyperlipidemia    Hypertension    Lumbar radiculitis  Per [place] New Patient Packet    Meningioma [facility])    Per [facility] New Patient Packet    Skin cancer 2000   melanoma and basal cell   Stress fracture    Right Heel, Per [place] New Patient Packet    TIA (transient ischemic attack) 2022   Vaso vagal episode    during preparation for colonoscopy    Past Surgical History:  Procedure Laterality Date   ABDOMINAL HYSTERECTOMY  1988   TAH/BSO--FIBROIDS   APPENDECTOMY     BREAST LUMPECTOMY     B/L--[facility]CS   BREAST LUMPECTOMY WITH RADIOACTIVE SEED AND SENTINEL LYMPH NODE BIOPSY Left  06/11/2015   Procedure: LEFT BREAST LUMPECTOMY WITH RADIOACTIVE SEED AND LEFT SENTINEL LYMPH NODE MAPPING;  Surgeon: Erroll Luna, MD;  Location: [place];  Service: General;  Laterality: Left;   CATARACT EXTRACTION  2015   COLONOSCOPY  2010   DG  BONE DENSITY ([place] HX)     EYE SURGERY Bilateral    cataracts   fiberadenoma Bilateral 1978, 1980   GANGLION CYST EXCISION     L  hand   Lipiflow procedure      Current Medications: Current Meds  Medication Sig   acetaminophen (TYLENOL) 325 MG tablet Take 325 mg by mouth every 6 (six) hours as needed.   AMBULATORY NON FORMULARY MEDICATION [place] use daily as directed.  Disp 1 Leg pain M79.604   Biotin 5 MG CAPS Take 5 mg by mouth daily.    Calcium Carbonate-Vitamin D 600-400 MG-UNIT tablet Take 1 tablet by mouth daily.   Cholecalciferol (VITAMIN D3) 1000 UNITS CAPS Take 1,000 Units by mouth daily.   cycloSPORINE (RESTASIS) 0.05 % ophthalmic emulsion Place 1 drop into both eyes 2 (two) times daily.   ELIQUIS 5 MG TABS tablet TAKE ONE TABLET BY MOUTH TWICE DAILY   flecainide (TAMBOCOR) 50 MG tablet TAKE 1 TABLET BY MOUTH TWICE DAILY.   ketoconazole (NIZORAL) 2 % cream APPLY TO AFFECTED AREA ONCE DAILY AS NEEDED FOR ITCHING. (Patient taking differently: Apply 1 application  topically daily as needed for irritation.)   Multiple Vitamins-Minerals (PRESERVISION AREDS) TABS Take 2 tablets by mouth daily.   Omega-3 Fatty Acids (FISH OIL PO) Take 1 tablet by mouth daily.    polyethylene glycol (MIRALAX / GLYCOLAX) 17 g packet Take 17 g by mouth daily.   pravastatin (PRAVACHOL) 40 MG tablet TAKE ONE TABLET BY MOUTH ONCE DAILY   Psyllium (METAMUCIL PO) Take 1 Dose by mouth daily. 1 teaspoon   Turmeric 500 MG CAPS Take 500 capsules by mouth daily.   vitamin B-12 (CYANOCOBALAMIN) 1000 MCG tablet Take 1 tablet (1,000 mcg total) by mouth daily.     Allergies:   Sulfa antibiotics, Tape, Glucosamine forte [nutritional supplements], and Latex   Social  History   Socioeconomic History   Marital status: Widowed    Spouse name: Not on file   Number of children: 2   Years of education: Not on file   Highest education level: Master's degree (e.g., MA, MS, MEng, MEd, [name], [name])  Occupational History   Occupation: Product manager: RETIRED    Comment: retired  Tobacco Use   Smoking status: Never   Smokeless tobacco: Never  Vaping Use   Vaping Use: Never used  Substance and Sexual Activity   Alcohol use: Yes    Comment: rare   Drug use: No   Sexual activity: Never  Other Topics Concern   Not on file  Social History  Narrative   Patient is widowed.  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    One son one daughter   1 caffeinated beverage daily   She is right-handed.    She works out everyday to a televised [employer] 30 minute program.   No tobacco or alcohol         Per [place] New Patient Packet Abstracted on 01/03/2020      Diet: Left blank       Caffeine: Yes      Married, if yes what year: Widowed, married in [place] you live in a house, apartment, assisted living, condo, trailer, ect: Apartment      Is it one or more stories: One stories, one person       Pets: 1 Neurosurgeon      Current/Past profession: Pharmacist, hospital      Highest level or education completed: MA of Education       Exercise:   Yes             Type and how often: Cardio, chair aerobics, 3-4 times weekly          Living Will: Yes   DNR: Yes   POA/HPOA: Yes      Functional Status:   Do you have difficulty bathing or dressing yourself? Left blank   Do you have difficulty preparing food or eating? Left blank   Do you have difficulty managing your medications? Left blank   Do you have difficulty managing your finances? Left blank   Do you have difficulty affording your medications? Left blank   Social Determinants of Health   Financial Resource Strain: Low Risk  (11/14/2019)   Overall Financial Resource Strain (CARDIA)    Difficulty of Paying  Living Expenses: Not hard at all  Food Insecurity: No Food Insecurity (11/14/2019)   Hunger Vital Sign    Worried About Running Out of Food in the Last Year: Never true    Ran Out of Food in the Last Year: Never true  Transportation Needs: No Transportation Needs (11/14/2019)   PRAPARE - Hydrologist (Medical): No    Lack of Transportation (Non-Medical): No  Physical Activity: Not on file  Stress: Not on file  Social Connections: Not on file     Family History: The patient's family history includes Cancer in her mother; Coronary artery disease in her brother; Diabetes in her brother and maternal aunt; Heart disease in her brother; Heart disease (age of onset: 79) in her father; High Cholesterol in her son; Hyperlipidemia in her brother; Hypertension in her brother and son; Lung cancer in an other family member; Lung cancer (age of onset: 3) in her sister; Thrombosis in an other family member.  ROS:   Please see the history of present illness.     All other systems reviewed and are negative.  EKGs/Labs/Other Studies Reviewed:    The following studies were reviewed today: Event monitor 09/02-09/15  ECHO 05/11/2021   1. A small pericardial effusion is present. The pericardial effusion is  anterior to the right ventricle. There is no RV or RA collapse suggestive  of increased pericardial pressures.   2. The inferior vena cava is normal in size with greater than 50%  respiratory variability, suggesting right atrial pressure of 3 mmHg.   3. Caseous mitral annular calcifification noted. The mitral valve is  abnormal. No evidence of mitral valve regurgitation. No evidence of  mitral  stenosis.   4. Left ventricular ejection fraction, by estimation, is 55 to 60%. The  left ventricle has normal function. The left ventricle has no regional  wall motion abnormalities. There is mild concentric left ventricular  hypertrophy. Left ventricular diastolic  parameters  are consistent with Grade II diastolic dysfunction  (pseudonormalization).   5. Right ventricular systolic function is normal. The right ventricular  size is normal. There is normal pulmonary artery systolic pressure.   6. Left atrial size was mildly dilated.   7. The aortic valve was not well visualized. Aortic valve regurgitation  is not visualized. No aortic stenosis is present.   Comparison(s): Prior images unable to be directly viewed, comparison made  by report only. Similar from prior report.   EKG:  EKG is not ordered today.  ECG from 05/10/2021 personally reviewed, shows sinus rhythmECG, T wave inversion in the anterolateral precordial leads.  There is left axis deviation that does not quite meet criteria for left anterior fascicular block.  QRS 102 ms QTC 424 ms  Recent Labs: 02/11/2021: TSH 2.99 11/04/2021: ALT 21; BUN 17; Creatinine, Ser 0.71; Hemoglobin 14.6; Platelets 205; Potassium 4.6; Sodium 141  Recent Lipid Panel    Component Value Date/Time   CHOL 146 05/11/2021 0128   TRIG 86 05/11/2021 0128   HDL 59 05/11/2021 0128   CHOLHDL 2.5 05/11/2021 0128   VLDL 17 05/11/2021 0128   LDLCALC 70 05/11/2021 0128   LDLDIRECT 153.4 11/16/2009 0000    Physical Exam:    VS:  BP 130/80 (BP Location: Left Arm, Patient Position: Sitting, Cuff Size: Normal)   Pulse 67   Ht 5' (1.524 m)   Wt 148 lb 12.8 oz (67.5 kg)   SpO2 95%   BMI 29.06 kg/m     Wt Readings from Last 3 Encounters:  01/06/22 148 lb 12.8 oz (67.5 kg)  12/20/21 154 lb (69.9 kg)  11/04/21 150 lb (68 kg)      General: Alert, oriented x3, no distress, overweight Head: no evidence of trauma, PERRL, EOMI, no exophtalmos or lid lag, no myxedema, no xanthelasma; normal ears, nose and oropharynx Neck: normal jugular venous pulsations and no hepatojugular reflux; brisk carotid pulses without delay and no carotid bruits Chest: clear to auscultation, no signs of consolidation by percussion or palpation, normal  fremitus, symmetrical and full respiratory excursions Cardiovascular: normal position and quality of the apical impulse, irregular rhythm, normal first and second heart sounds, no murmurs, rubs or gallops Abdomen: no tenderness or distention, no masses by palpation, no abnormal pulsatility or arterial bruits, normal bowel sounds, no hepatosplenomegaly Extremities: no clubbing, cyanosis or edema; 2+ radial, ulnar and brachial pulses bilaterally; 2+ right femoral, posterior tibial and dorsalis pedis pulses; 2+ left femoral, posterior tibial and dorsalis pedis pulses; no subclavian or femoral bruits Neurological: grossly nonfocal Psych: Normal mood and affect    ASSESSMENT:    1. Paroxysmal atrial fibrillation (HCC)   2. SSS (sick sinus syndrome) ([place])   3. Encounter for monitoring flecainide therapy   4. Acquired thrombophilia ([place])   5. Essential hypertension   6. History of ischemic stroke   7. Hypercholesterolemia      PLAN:    In order of problems listed above:  AFib: This is her first meaningful episode of atrial fibrillation breakthrough since starting flecainide a couple of years ago.  Last year she was in sinus rhythm and had to decrease the dose of diltiazem due to bradycardia.  She had evidence of  chronotropic incompetence but wished to delay pacemaker discussion.  She now has atrial fibrillation with slow ventricular response even after discontinuing the diltiazem.  She is quite symptomatic due to the arrhythmia, rather than a slow heartbeat.  We will schedule for cardioversion with deep sedation by anesthesiology. This procedure has been fully reviewed with the patient and written informed consent has been obtained.  If she has early recurrence of atrial fibrillation options include switching to dofetilide or amiodarone.  Either 1 of these would be likely to precipitate the need for pacemaker implantation. SSS/AV conduction abnormalities: It is very likely that she will need a  pacemaker in the near future regardless of the results of the cardioversion.  We discussed this briefly today.  In the meantime, avoid all medications with negative chronotropic effect including beta-blockers and centrally acting calcium channel blockers. Flecainide: No evidence of toxicity to date.  Has worked pretty well to maintain sinus rhythm until just the past few weeks. Anticoagulation: Has not had any interruptions in medication in the last 4 weeks.  No actual falls, no bleeding complications. HTN: Well-controlled.  History of orthostatic hypotension. History of CVA: Demonstrated on CT, but not really correlating with other neuro symptoms.  Distribution is not typical for embolic events. HLP: All lipid parameters in desirable range on the current medications.  Medication Adjustments/Labs and Tests Ordered: Current medicines are reviewed at length with the patient today.  Concerns regarding medicines are outlined above.  Orders Placed This Encounter  Procedures   Basic metabolic panel   CBC   EKG 12-Lead    No orders of the defined types were placed in this encounter.    Patient Instructions  Medication Instructions:  No changes *If you need a refill on your cardiac medications before your next appointment, please call your pharmacy*   Lab Work: Your provider would like for you to have the following labs today: CBC and BMET  If you have labs (blood work) drawn today and your tests are completely normal, you will receive your results only by: MyChart Message (if you have MyChart) OR A paper copy in the mail If you have any lab test that is abnormal or we need to change your treatment, we will call you to review the results.  Follow-Up: At [facility] you and your health needs are our priority.  As part of our continuing mission to provide you with exceptional heart care, we have created designated Provider Care Teams.  These Care Teams include your primary Cardiologist  (physician) and Advanced Practice Providers (APPs -  Physician Assistants and Nurse Practitioners) who all work together to provide you with the care you need, when you need it.  We recommend signing up for the patient portal called "MyChart".  Sign up information is provided on this After Visit Summary.  MyChart is used to connect with patients for Virtual Visits (Telemedicine).  Patients are able to view lab/test results, encounter notes, upcoming appointments, etc.  Non-urgent messages can be sent to your provider as well.   To learn more about what you can do with MyChart, go to [email or web address].    Your next appointment:   3 month(s)  The format for your next appointment:   In Person  Provider:   Sanda Klein, MD {    Other Instructions  You are scheduled for a Cardioversion on 01/11/22 with Dr. Harrell Gave.  Please arrive at the [facility] (Main Entrance A) at [facility]: [place], [place]  [address] at 10:30 am. (1 hour prior to procedure)  DIET: Nothing to eat or drink after midnight except a sip of water with medications (see medication instructions below)  FYI: For your safety, and to allow [place] to monitor your vital signs accurately during the surgery/procedure we request that   if you have artificial nails, gel coating, SNS etc. Please have those removed prior to your surgery/procedure. Not having the nail coverings /polish removed may result in cancellation or delay of your surgery/procedure.   Medication Instructions: Hold nothing to hold  Continue your anticoagulant: Eliquis You will need to continue your anticoagulant after your procedure until you  are told by your provider that it is safe to stop   Labs: labs completed today  You must have a responsible person to drive you home and stay in the waiting area during your procedure. Failure to do so could result in cancellation.  Bring your insurance cards.  *Special Note: Every effort  is made to have your procedure done on time. Occasionally there are emergencies that occur at the hospital that may cause delays. Please be patient if a delay does occur.        Signed, Sanda Klein, MD  01/06/2022 2:03 PM    [place]

## 2022-01-06 NOTE — Patient Instructions (Signed)
Medication Instructions:  No changes *If you need a refill on your cardiac medications before your next appointment, please call your pharmacy*   Lab Work: Your provider would like for you to have the following labs today: CBC and BMET  If you have labs (blood work) drawn today and your tests are completely normal, you will receive your results only by: [place] (if you have MyChart) OR A paper copy in the mail If you have any lab test that is abnormal or we need to change your treatment, we will call you to review the results.  Follow-Up: At [facility], you and your health needs are our priority.  As part of our continuing mission to provide you with exceptional heart care, we have created designated Provider Care Teams.  These Care Teams include your primary Cardiologist (physician) and Advanced Practice Providers (APPs -  Physician Assistants and Nurse Practitioners) who all work together to provide you with the care you need, when you need it.  We recommend signing up for the patient portal called "MyChart".  Sign up information is provided on this After Visit Summary.  MyChart is used to connect with patients for Virtual Visits (Telemedicine).  Patients are able to view lab/test results, encounter notes, upcoming appointments, etc.  Non-urgent messages can be sent to your provider as well.   To learn more about what you can do with MyChart, go to [email or web address].    Your next appointment:   3 month(s)  The format for your next appointment:   In Person  Provider:   Sanda Klein, MD {    Other Instructions  You are scheduled for a Cardioversion on 01/11/22 with Dr. Harrell Gave.  Please arrive at the [facility] (Main Entrance A) at [facility]: [address] [place], [place] [address] at 10:30 am. (1 hour prior to procedure)  DIET: Nothing to eat or drink after midnight except a sip of water with medications (see medication instructions below)  FYI:  For your safety, and to allow [place] to monitor your vital signs accurately during the surgery/procedure we request that   if you have artificial nails, gel coating, SNS etc. Please have those removed prior to your surgery/procedure. Not having the nail coverings /polish removed may result in cancellation or delay of your surgery/procedure.   Medication Instructions: Hold nothing to hold  Continue your anticoagulant: Eliquis You will need to continue your anticoagulant after your procedure until you  are told by your provider that it is safe to stop   Labs: labs completed today  You must have a responsible person to drive you home and stay in the waiting area during your procedure. Failure to do so could result in cancellation.  Bring your insurance cards.  *Special Note: Every effort is made to have your procedure done on time. Occasionally there are emergencies that occur at the hospital that may cause delays. Please be patient if a delay does occur.

## 2022-01-07 ENCOUNTER — Telehealth: Payer: Self-pay | Admitting: Cardiovascular Disease

## 2022-01-07 NOTE — Telephone Encounter (Signed)
Pt c/o medication issue:  1. Name of Medication:   flecainide (TAMBOCOR) 50 MG tablet  2. How are you currently taking this medication (dosage and times per day)?   As prescribed  3. Are you having a reaction (difficulty breathing--STAT)?  No  4. What is your medication issue?    Patient is concerned if she should still take this medication because she is has an upcoming cardioversion procedure.

## 2022-01-07 NOTE — Telephone Encounter (Signed)
Called pt to go over her instructions for her cardioversion. Pt did not remember instructions about her other medications. She wrote herself a note to remember. She thanked me for calling her back.

## 2022-01-10 ENCOUNTER — Telehealth: Payer: Self-pay | Admitting: Cardiovascular Disease

## 2022-01-10 ENCOUNTER — Other Ambulatory Visit: Payer: Self-pay | Admitting: *Deleted

## 2022-01-10 DIAGNOSIS — I480 Paroxysmal atrial fibrillation: Secondary | ICD-10-CM

## 2022-01-10 NOTE — Telephone Encounter (Signed)
[employer] called with a critical monitor results from 6/27. This has already been addressed and patient has been seen in the office since the.   90 minute run of slow afib with heart rate of 40.

## 2022-01-11 ENCOUNTER — Other Ambulatory Visit: Payer: Self-pay

## 2022-01-11 ENCOUNTER — Ambulatory Visit (HOSPITAL_BASED_OUTPATIENT_CLINIC_OR_DEPARTMENT_OTHER): Payer: Medicare PPO | Admitting: Anesthesiology

## 2022-01-11 ENCOUNTER — Encounter (HOSPITAL_COMMUNITY): Payer: Self-pay | Admitting: Cardiology

## 2022-01-11 ENCOUNTER — Ambulatory Visit (HOSPITAL_COMMUNITY)
Admission: RE | Admit: 2022-01-11 | Discharge: 2022-01-11 | Disposition: A | Discharge status: Nursing Home (Includes ICF) | Payer: Medicare PPO | Source: Ambulatory Visit | Attending: Cardiology | Admitting: Cardiology

## 2022-01-11 ENCOUNTER — Ambulatory Visit (HOSPITAL_COMMUNITY): Payer: Medicare PPO | Admitting: Anesthesiology

## 2022-01-11 ENCOUNTER — Encounter (HOSPITAL_COMMUNITY)
Admission: RE | Disposition: A | Discharge status: Nursing Home (Includes ICF) | Payer: Self-pay | Source: Ambulatory Visit | Attending: Cardiology

## 2022-01-11 DIAGNOSIS — I10 Essential (primary) hypertension: Secondary | ICD-10-CM | POA: Diagnosis not present

## 2022-01-11 DIAGNOSIS — E7800 Pure hypercholesterolemia, unspecified: Secondary | ICD-10-CM | POA: Diagnosis not present

## 2022-01-11 DIAGNOSIS — Z66 Do not resuscitate: Secondary | ICD-10-CM | POA: Diagnosis not present

## 2022-01-11 DIAGNOSIS — D6859 Other primary thrombophilia: Secondary | ICD-10-CM | POA: Diagnosis not present

## 2022-01-11 DIAGNOSIS — Z8673 Personal history of transient ischemic attack (TIA), and cerebral infarction without residual deficits: Secondary | ICD-10-CM | POA: Diagnosis not present

## 2022-01-11 DIAGNOSIS — I4891 Unspecified atrial fibrillation: Secondary | ICD-10-CM

## 2022-01-11 DIAGNOSIS — I495 Sick sinus syndrome: Secondary | ICD-10-CM | POA: Diagnosis not present

## 2022-01-11 DIAGNOSIS — Z79899 Other long term (current) drug therapy: Secondary | ICD-10-CM | POA: Insufficient documentation

## 2022-01-11 DIAGNOSIS — I480 Paroxysmal atrial fibrillation: Secondary | ICD-10-CM | POA: Insufficient documentation

## 2022-01-11 HISTORY — PX: CARDIOVERSION: SHX1299

## 2022-01-11 SURGERY — CARDIOVERSION
Anesthesia: General

## 2022-01-11 MED ORDER — PROPOFOL 10 MG/ML IV BOLUS
INTRAVENOUS | Status: DC | PRN
  Administered 2022-01-11: 50 mg via INTRAVENOUS

## 2022-01-11 MED ORDER — LIDOCAINE 2% (20 MG/ML) 5 ML SYRINGE
INTRAMUSCULAR | Status: DC | PRN
  Administered 2022-01-11: 70 mg via INTRAVENOUS

## 2022-01-11 MED ORDER — SODIUM CHLORIDE 0.9 % IV SOLN: INTRAVENOUS | Status: DC

## 2022-01-11 NOTE — Transfer of Care (Signed)
Immediate Anesthesia Transfer of Care Note  Patient: Jessica Mullins  Procedure(s) Performed: CARDIOVERSION  Patient Location: Endoscopy Unit  Anesthesia Type:General  Level of Consciousness: drowsy and patient cooperative  Airway & Oxygen Therapy: Patient Spontanous Breathing  Post-op Assessment: Report given to RN and Post -op Vital signs reviewed and stable  Post vital signs: Reviewed and stable  Last Vitals:  Vitals Value Taken Time  BP 179/95   Temp    Pulse 68 01/11/22 1121  Resp 11 01/11/22 1121  SpO2 99 % 01/11/22 1121  Vitals shown include unvalidated device data.  Last Pain:  Vitals:   01/11/22 1040  TempSrc: Temporal  PainSc: 0-No pain         Complications: No notable events documented.

## 2022-01-11 NOTE — CV Procedure (Signed)
Procedure:   DCCV  Indication:  Symptomatic atrial fibrillation  Procedure Note:  The patient signed informed consent.  They have had had therapeutic anticoagulation with apixaban greater than 3 weeks.  Anesthesia was administered by Dr. Lissa Hoard.  Patient received 70 mg IV lidocaine and 50 mg IV propofol.Adequate airway was maintained throughout and vital followed per protocol.  They were cardioverted x 1 with 120J of biphasic synchronized energy.  They converted to NSR.  There were no apparent complications.  The patient had normal neuro status and respiratory status post procedure with vitals stable as recorded elsewhere.    Follow up:  They will continue on current medical therapy and follow up with cardiology as scheduled.  Buford Dresser, MD PhD 01/11/2022 11:20 AM

## 2022-01-11 NOTE — Discharge Instructions (Signed)

## 2022-01-11 NOTE — Interval H&P Note (Signed)
History and Physical Interval Note:  01/11/2022 10:25 AM  Jessica Mullins  has presented today for surgery, with the diagnosis of AFIB.  The various methods of treatment have been discussed with the patient and family. After consideration of risks, benefits and other options for treatment, the patient has consented to  Procedure(s): CARDIOVERSION (N/A) as a surgical intervention.  The patient's history has been reviewed, patient examined, no change in status, stable for surgery.  I have reviewed the patient's chart and labs.  Questions were answered to the patient's satisfaction.     Jessica Mullins

## 2022-01-11 NOTE — Anesthesia Preprocedure Evaluation (Addendum)
Anesthesia Evaluation  Patient identified by MRN, date of birth, ID band Patient awake    Reviewed: Allergy & Precautions, NPO status , Patient's Chart, lab work & pertinent test results  Airway Mallampati: II  TM Distance: >3 FB Neck ROM: Full    Dental  (+) Dental Advisory Given, Teeth Intact   Pulmonary neg pulmonary ROS,    Pulmonary exam normal breath sounds clear to auscultation       Cardiovascular hypertension, Pt. on medications Normal cardiovascular exam Rhythm:Regular Rate:Normal  Echo 04/2021 1. A small pericardial effusion is present. The pericardial effusion is anterior to the right ventricle. There is no RV or RA collapse suggestive of increased pericardial pressures.  2. The inferior vena cava is normal in size with greater than 50% respiratory variability, suggesting right atrial pressure of 3 mmHg.  3. Caseous mitral annular calcifification noted. The mitral valve is abnormal. No evidence of mitral valve regurgitation. No evidence of mitral stenosis.  4. Left ventricular ejection fraction, by estimation, is 55 to 60%. The left ventricle has normal function. The left ventricle has no regional wall motion abnormalities. There is mild concentric left ventricular hypertrophy. Left ventricular diastolic parameters are consistent with Grade II diastolic dysfunction (pseudonormalization).  5. Right ventricular systolic function is normal. The right ventricular size is normal. There is normal pulmonary artery systolic pressure.  6. Left atrial size was mildly dilated.  7. The aortic valve was not well visualized. Aortic valve regurgitation is not visualized. No aortic stenosis is present.    Neuro/Psych TIA Neuromuscular disease CVA    GI/Hepatic Neg liver ROS, GERD  ,  Endo/Other  negative endocrine ROS  Renal/GU negative Renal ROS     Musculoskeletal negative musculoskeletal ROS (+)   Abdominal   Peds   Hematology negative hematology ROS (+)   Anesthesia Other Findings   Reproductive/Obstetrics                           Anesthesia Physical Anesthesia Plan  ASA: 3  Anesthesia Plan: General   Post-op Pain Management:    Induction: Intravenous  PONV Risk Score and Plan: 3 and Propofol infusion, Treatment may vary due to age or medical condition and TIVA  Airway Management Planned: Natural Airway and Mask  Additional Equipment:   Intra-op Plan:   Post-operative Plan:   Informed Consent: I have reviewed the patients History and Physical, chart, labs and discussed the procedure including the risks, benefits and alternatives for the proposed anesthesia with the patient or authorized representative who has indicated his/her understanding and acceptance.     Dental advisory given  Plan Discussed with: CRNA  Anesthesia Plan Comments:        Anesthesia Quick Evaluation

## 2022-01-11 NOTE — Anesthesia Procedure Notes (Signed)
Procedure Name: General with mask airway Date/Time: 01/11/2022 11:12 AM  Performed by: Janene Harvey, CRNAPre-anesthesia Checklist: Patient identified, Emergency Drugs available, Suction available and Patient being monitored Patient Re-evaluated:Patient Re-evaluated prior to induction Oxygen Delivery Method: Ambu bag Placement Confirmation: positive ETCO2 Dental Injury: Teeth and Oropharynx as per pre-operative assessment

## 2022-01-11 NOTE — Anesthesia Postprocedure Evaluation (Signed)
Anesthesia Post Note  Patient: Jessica Mullins  Procedure(s) Performed: CARDIOVERSION     Patient location during evaluation: PACU Anesthesia Type: General Level of consciousness: patient cooperative and awake and alert Pain management: pain level controlled Vital Signs Assessment: post-procedure vital signs reviewed and stable Respiratory status: spontaneous breathing Cardiovascular status: stable Anesthetic complications: no   No notable events documented.  Last Vitals:  Vitals:   01/11/22 1140 01/11/22 1148  BP: (!) 167/92   Pulse: 66 68  Resp: 11 17  Temp:    SpO2: 94% 96%    Last Pain:  Vitals:   01/11/22 1148  TempSrc:   PainSc: 0-No pain                 Nolon Nations

## 2022-01-12 DIAGNOSIS — L570 Actinic keratosis: Secondary | ICD-10-CM | POA: Diagnosis not present

## 2022-01-12 DIAGNOSIS — Z85828 Personal history of other malignant neoplasm of skin: Secondary | ICD-10-CM | POA: Diagnosis not present

## 2022-01-12 DIAGNOSIS — L821 Other seborrheic keratosis: Secondary | ICD-10-CM | POA: Diagnosis not present

## 2022-01-12 DIAGNOSIS — L814 Other melanin hyperpigmentation: Secondary | ICD-10-CM | POA: Diagnosis not present

## 2022-01-12 DIAGNOSIS — Z85820 Personal history of malignant melanoma of skin: Secondary | ICD-10-CM | POA: Diagnosis not present

## 2022-01-18 ENCOUNTER — Ambulatory Visit: Payer: Medicare PPO | Admitting: Cardiovascular Disease

## 2022-01-21 ENCOUNTER — Telehealth: Payer: Self-pay | Admitting: Cardiovascular Disease

## 2022-01-21 NOTE — Telephone Encounter (Signed)
Spoke with pt, aware she returned to normal rhythm when cardioverted and to continue current medications until seen 04/22/22. She does not really know when she is out of rhythm.

## 2022-01-21 NOTE — Telephone Encounter (Signed)
Pt seemed confused and denied talking to anyone on 07/24 in regards to the cardioversion results and states she would like an update on her results. Requesting call back.

## 2022-01-25 IMAGING — CT CT CHEST W/O CM
2 of 4 series · 15 of 36 positions shown, 18 images · non-contrast
Comparison: CT cervical spine, 09/11/2018

CLINICAL DATA: Follow-up lung nodule, history of breast cancer

EXAM:
CT CHEST WITHOUT CONTRAST
TECHNIQUE: Multidetector CT imaging of the chest was performed following the
standard protocol without IV contrast.

[Series 2: thorax · axial · 0.70mm/px · z∈[-264,-52]mm · 12 of 126 slices shown, 15 images]
[im 10/126  mediastinal]
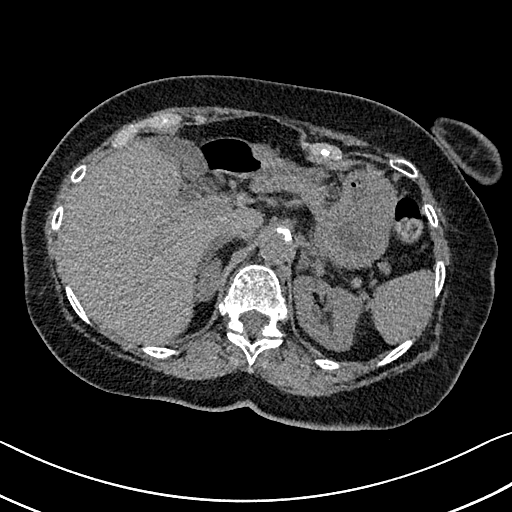
[im 10/126  lung]
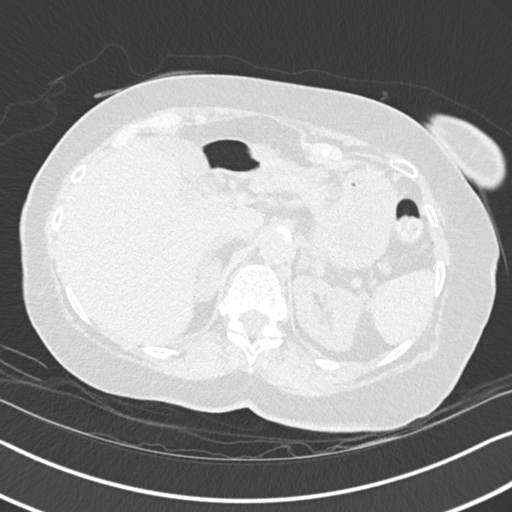
[im 20/126  lung]
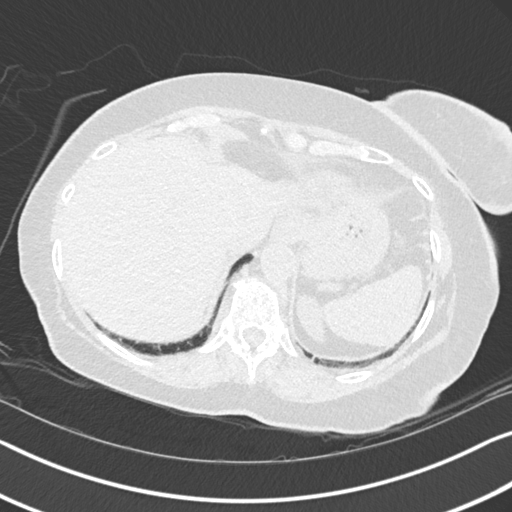
[im 29/126  lung]
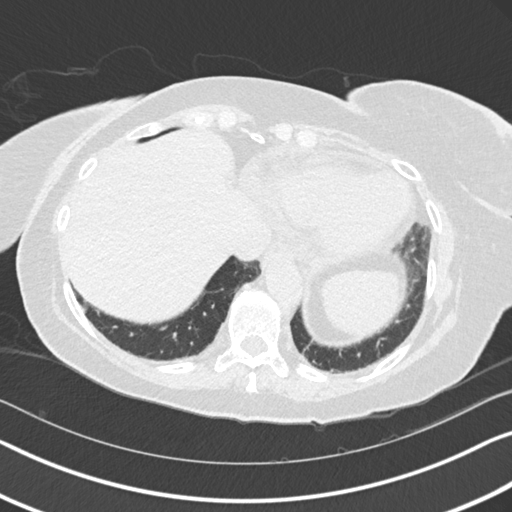
[im 39/126  lung]
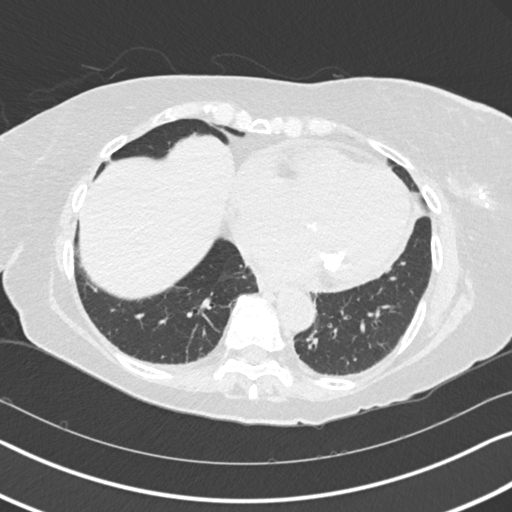
[im 49/126  mediastinal]
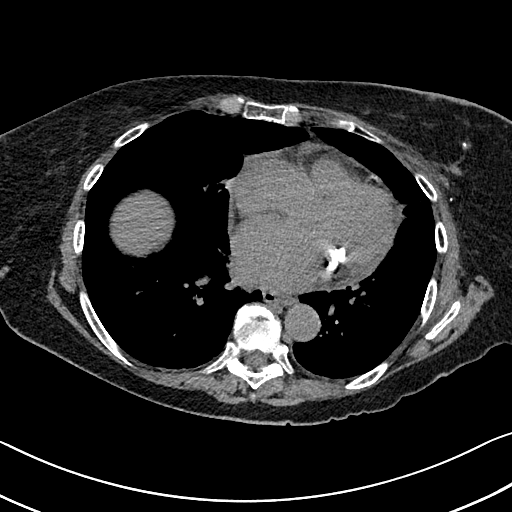
[im 49/126  lung]
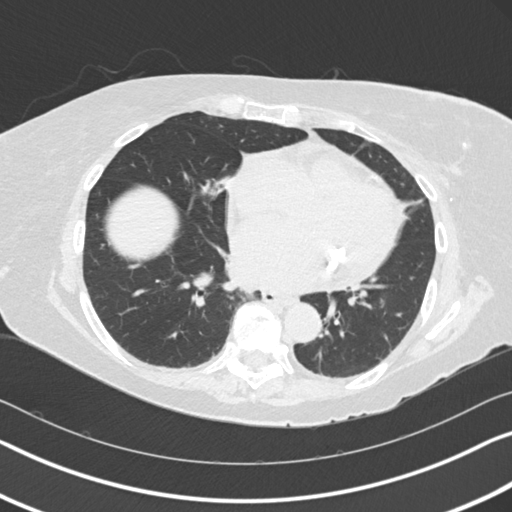
[im 58/126  lung]
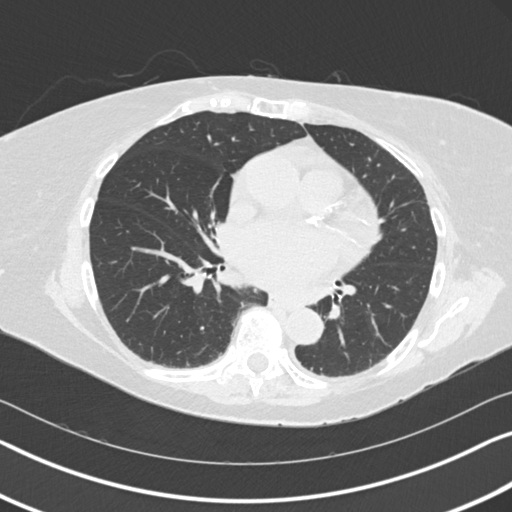
[im 68/126  lung]
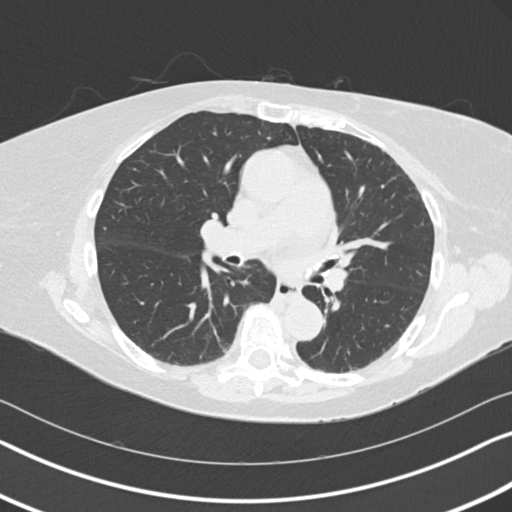
[im 77/126  lung]
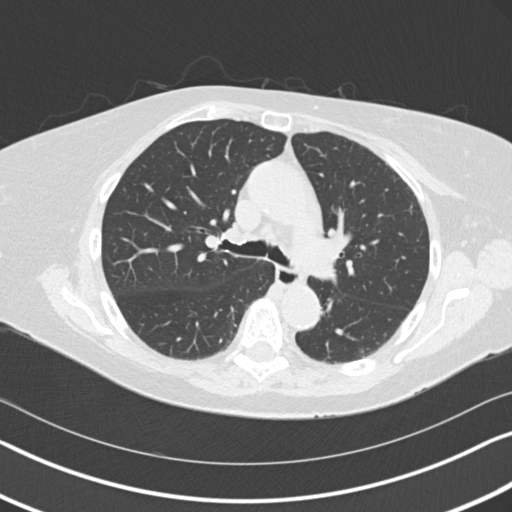
[im 87/126  mediastinal]
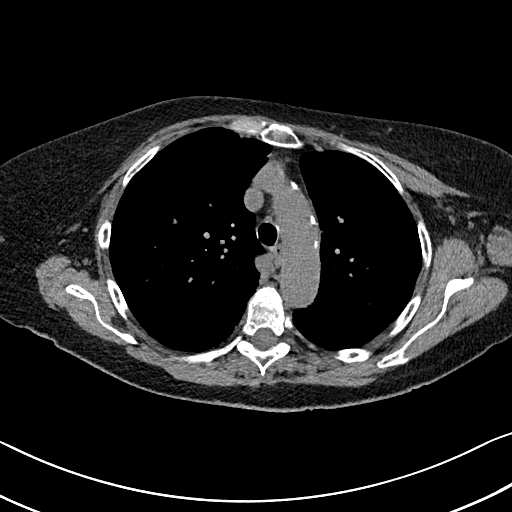
[im 87/126  lung]
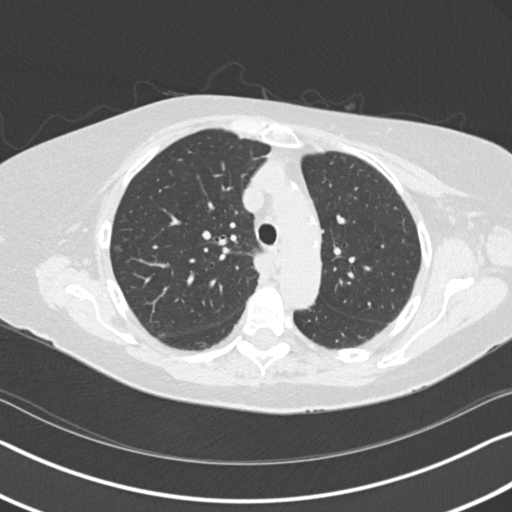
[im 97/126  lung]
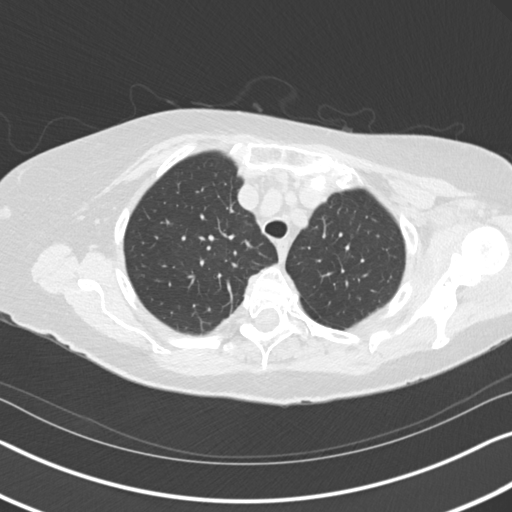
[im 106/126  lung]
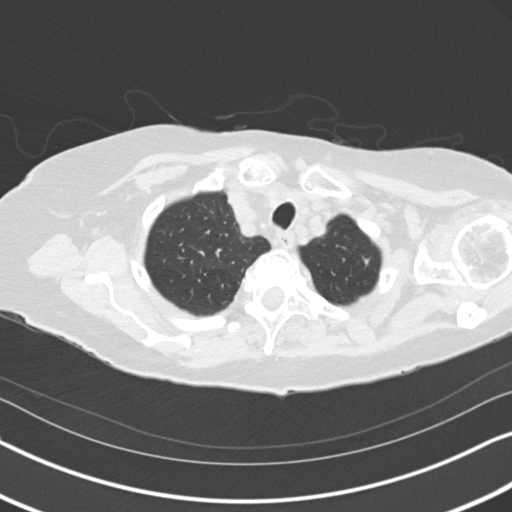
[im 116/126  lung]
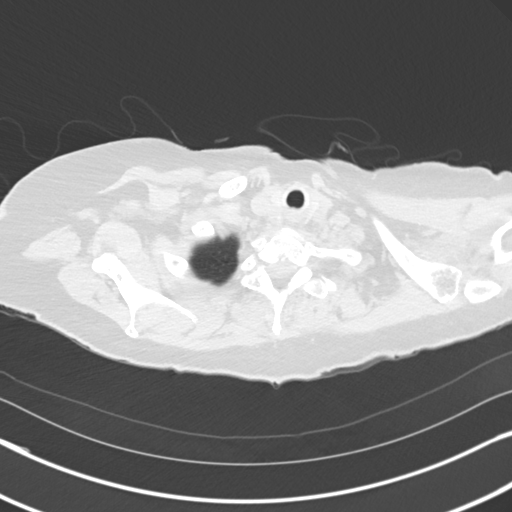

[Series 5: coronal · coronal · 0.52mm/px · 3 of 126 slices shown]
[im 26/126  lung]
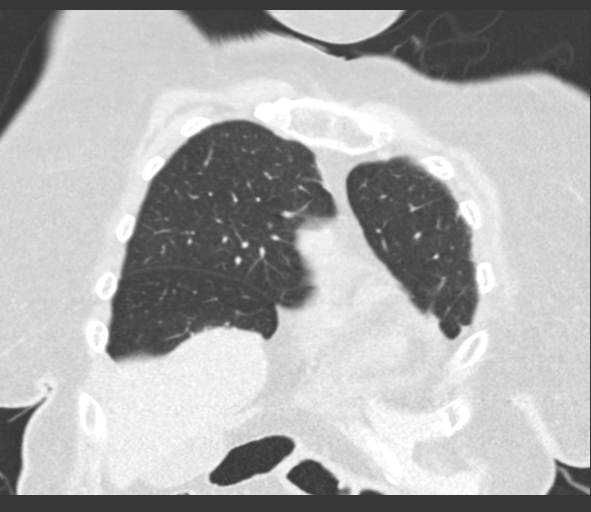
[im 51/126  lung]
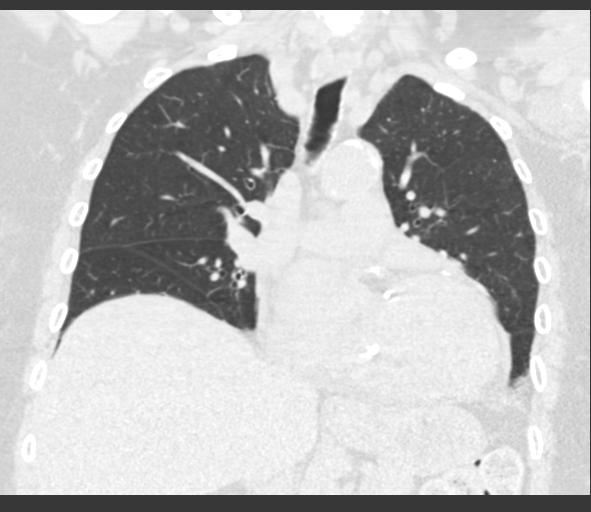
[im 76/126  lung]
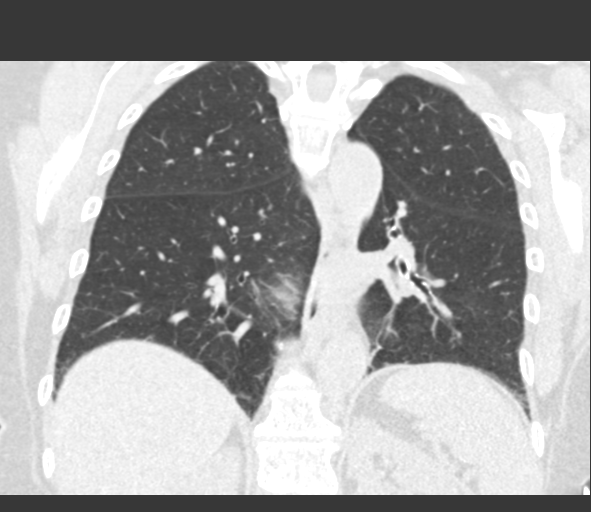

[15 of 36 positions shown; findings below may reference images not displayed]

FINDINGS: Cardiovascular: Aortic atherosclerosis. Normal heart size.
Three-vessel coronary artery calcifications. Trace pericardial
effusion.

Mediastinum/Nodes: No enlarged mediastinal, hilar, or axillary lymph
nodes. Thyroid gland, trachea, and esophagus demonstrate no
significant findings.

Lungs/Pleura: Unchanged 0.4 cm nodule of the left pulmonary apex
(series 3, image 22). Mild subpleural radiation fibrosis of the
anterior left upper lobe and lingula. Bland appearing, bandlike
scarring of the bilateral lung bases, with scarring and volume loss
of the medial segment right middle lobe. No pleural effusion or
pneumothorax.

Upper Abdomen: No acute abnormality.

Musculoskeletal: No chest wall mass or suspicious bone lesions
identified. Status post left lumpectomy.
IMPRESSION: 1. Unchanged 0.4 cm nodule of the left pulmonary apex, benign. No
further follow-up or characterization is required.
2. Postoperative findings of left lumpectomy with minimal underlying
radiation fibrosis.
3. Coronary artery disease.

Aortic Atherosclerosis ([id number]).

## 2022-01-26 ENCOUNTER — Ambulatory Visit: Payer: Medicare PPO

## 2022-01-26 DIAGNOSIS — M8580 Other specified disorders of bone density and structure, unspecified site: Secondary | ICD-10-CM | POA: Diagnosis not present

## 2022-01-26 MED ORDER — DENOSUMAB 60 MG/ML ~~LOC~~ SOSY
60.0000 mg | PREFILLED_SYRINGE | Freq: Once | SUBCUTANEOUS | Status: AC
  Administered 2022-01-26: 60 mg via SUBCUTANEOUS

## 2022-01-27 DIAGNOSIS — H903 Sensorineural hearing loss, bilateral: Secondary | ICD-10-CM | POA: Diagnosis not present

## 2022-01-28 ENCOUNTER — Telehealth: Payer: Self-pay | Admitting: Cardiovascular Disease

## 2022-01-28 NOTE — Telephone Encounter (Signed)
Vatalie from [facility] called to f/u on atrial fibrillation a few weeks ago and wanted to see how the pt is now. Please advise

## 2022-01-31 NOTE — Telephone Encounter (Signed)
Returned call to [employer] with [facility].  She wanted to follow up on patient to make sure there was no harm.  She advised they missed the slow afib and wanted to follow up.  Advised unable to provide patient information.  Patient was seen in office 7/13, cardioversion 7/18.  Discussing PPM at upcoming appt.

## 2022-01-31 NOTE — Telephone Encounter (Signed)
Calling to F/U on the outcome on pt. Please advise

## 2022-02-07 ENCOUNTER — Encounter: Payer: Self-pay | Admitting: Adult Health

## 2022-02-07 ENCOUNTER — Non-Acute Institutional Stay: Payer: Medicare PPO | Admitting: Adult Health

## 2022-02-07 VITALS — BP 142/98 | HR 81 | Temp 97.9°F | Ht 60.0 in | Wt 148.2 lb

## 2022-02-07 DIAGNOSIS — I480 Paroxysmal atrial fibrillation: Secondary | ICD-10-CM

## 2022-02-07 DIAGNOSIS — D329 Benign neoplasm of meninges, unspecified: Secondary | ICD-10-CM

## 2022-02-07 DIAGNOSIS — K5901 Slow transit constipation: Secondary | ICD-10-CM

## 2022-02-07 DIAGNOSIS — I10 Essential (primary) hypertension: Secondary | ICD-10-CM

## 2022-02-07 DIAGNOSIS — Z853 Personal history of malignant neoplasm of breast: Secondary | ICD-10-CM

## 2022-02-07 DIAGNOSIS — R269 Unspecified abnormalities of gait and mobility: Secondary | ICD-10-CM

## 2022-02-07 DIAGNOSIS — M8580 Other specified disorders of bone density and structure, unspecified site: Secondary | ICD-10-CM | POA: Diagnosis not present

## 2022-02-07 DIAGNOSIS — E782 Mixed hyperlipidemia: Secondary | ICD-10-CM

## 2022-02-07 NOTE — Progress Notes (Signed)
Location:  [facility]  POS: Clinic  Provider: Royal Hawthorn, ANP  Code Status:  Goals of Care:     02/07/2022    3:06 PM  Advanced Directives  Does Patient Have a Medical Advance Directive? Yes  Type of Paramedic of [place];Living will  Does patient want to make changes to medical advance directive? No - Patient declined  Copy of [place] in Chart? Yes - validated most recent copy scanned in chart (See row information)     Chief Complaint  Patient presents with   Medication Management    Patient returns to the clinic for her 4 month follow up.   Quality Metric Gaps    Covid and flu shot    HPI: Patient is a 88 y.o. female seen today for medical management of chronic diseases.   PMH significant for CVA, afib, HTN, gait abnormality, HLD, orthostatic hypotension, bradycardia, lumbar radiculitis, meningioma, osteopenia, and breast cancer.   Sometimes feels like she has some palpitations in the morning, needs to go slowly.  Had cardioversion for afib 01/11/22 Had some bradycardia which did improve off cardizem 2nd degree AV block on zio patch July 2023  Poor balance, uses walker which is chronic .   Has constipation. Had a BM today. Sometimes she goes 4-5 x a day. Other times not at al. Dewaine Conger miralax and metamucil  Exercises mixture of weights and cardio, also yoga just started back, was doing it every day. Going slowly.   Mammogram 07/11/21 normal  Dexa scan 07/21/21 t score -1.1 on Prolia   Lung nodule: CT scan showed 0.4cm nodule of the left lung apex, benign. No further f/u recommended.  Meningioma: MRI of the brain 11/04/21 showed stable appearance.  Hx of meningioma stable size 3.3cm 12/10/20, follows with neurosurgery  Afib controlled on Eliquis follows with cardiology  H/o left Breast Cancer Invasive Ductal Carcinoma diagnosed in 2016 S/p lumpectomy, chemo, radiation  Past Medical History:  Diagnosis Date   Allergy     Ankle fracture, left    Atrial fibrillation ([place])    Per [place] New Patient Packet    Breast cancer ([place]) 1115/16   left    Bursitis of right shoulder    Cataract    Family history of cancer    Fracture of right wrist    GERD (gastroesophageal reflux disease)    Hyperlipidemia    Hypertension    Lumbar radiculitis    Per [place] New Patient Packet    Meningioma [facility])    Per [place] New Patient Packet    Skin cancer 2000   melanoma and basal cell   Stress fracture    Right Heel, Per [place] New Patient Packet    TIA (transient ischemic attack) 2022   Vaso vagal episode    during preparation for colonoscopy    Past Surgical History:  Procedure Laterality Date   ABDOMINAL HYSTERECTOMY  1988   TAH/BSO--FIBROIDS   APPENDECTOMY     BREAST LUMPECTOMY     B/L--[facility]   BREAST LUMPECTOMY WITH RADIOACTIVE SEED AND SENTINEL LYMPH NODE BIOPSY Left 06/11/2015   Procedure: LEFT BREAST LUMPECTOMY WITH RADIOACTIVE SEED AND LEFT SENTINEL LYMPH NODE MAPPING;  Surgeon: Erroll Luna, MD;  Location: [facility];  Service: General;  Laterality: Left;   CARDIOVERSION N/A 01/11/2022   Procedure: CARDIOVERSION;  Surgeon: Buford Dresser, MD;  Location: [facility];  Service: Cardiovascular;  Laterality: N/A;   CATARACT EXTRACTION  2015   COLONOSCOPY  2010  DG  BONE DENSITY ([facility] HX)     EYE SURGERY Bilateral    cataracts   fiberadenoma Bilateral 1978, 1980   GANGLION CYST EXCISION     L  hand   Lipiflow procedure      Allergies  Allergen Reactions   Sulfa Antibiotics Other (See Comments)    As a child, rigid as a stick and not responsive   Tape Other (See Comments)    Blisters, Please use "paper" tape only for short periods of time   Glucosamine Forte [Nutritional Supplements] Rash   Latex Rash    Outpatient Encounter Medications as of 02/07/2022  Medication Sig   acetaminophen (TYLENOL) 325 MG tablet Take 325 mg by mouth every 6 (six) hours as needed for moderate pain.   AMBULATORY NON  FORMULARY MEDICATION Walker use daily as directed.  Disp 1 Leg pain M79.604   Biotin 5 MG CAPS Take 5 mg by mouth daily.    Calcium Carbonate-Vitamin D 600-400 MG-UNIT tablet Take 1 tablet by mouth daily.   Cholecalciferol (VITAMIN D3) 1000 UNITS CAPS Take 1,000 Units by mouth daily.   cycloSPORINE (RESTASIS) 0.05 % ophthalmic emulsion Place 1 drop into both eyes 2 (two) times daily.   ELIQUIS 5 MG TABS tablet TAKE ONE TABLET BY MOUTH TWICE DAILY   flecainide (TAMBOCOR) 50 MG tablet TAKE 1 TABLET BY MOUTH TWICE DAILY.   ketoconazole (NIZORAL) 2 % cream APPLY TO AFFECTED AREA ONCE DAILY AS NEEDED FOR ITCHING.   Multiple Vitamins-Minerals (PRESERVISION AREDS) TABS Take 2 tablets by mouth daily.   Omega-3 Fatty Acids (FISH OIL PO) Take 1 Capful by mouth daily.   polyethylene glycol (MIRALAX / GLYCOLAX) 17 g packet Take 17 g by mouth daily.   pravastatin (PRAVACHOL) 40 MG tablet TAKE ONE TABLET BY MOUTH ONCE DAILY   Psyllium (METAMUCIL PO) Take 1 Dose by mouth daily. 1 teaspoon   Turmeric 500 MG CAPS Take 500 capsules by mouth daily.   vitamin B-12 (CYANOCOBALAMIN) 1000 MCG tablet Take 1 tablet (1,000 mcg total) by mouth daily.   No facility-administered encounter medications on file as of 02/07/2022.    Review of Systems:  Review of Systems  Constitutional:  Negative for activity change, appetite change, chills, diaphoresis, fatigue, fever and unexpected weight change.  HENT:  Negative for congestion.   Respiratory:  Negative for cough, shortness of breath and wheezing.   Cardiovascular:  Positive for palpitations (in the past but not currently). Negative for chest pain and leg swelling.  Gastrointestinal:  Positive for constipation. Negative for abdominal distention, abdominal pain and diarrhea.  Endocrine: Negative for polyuria.  Genitourinary:  Negative for difficulty urinating and dysuria.  Musculoskeletal:  Positive for gait problem. Negative for arthralgias, back pain, joint swelling  and myalgias.  Neurological:  Negative for dizziness, tremors, seizures, syncope, facial asymmetry, speech difficulty, weakness, light-headedness, numbness and headaches.  Psychiatric/Behavioral:  Negative for agitation, behavioral problems and confusion.     Health Maintenance  Topic Date Due   COVID-19 Vaccine (5 - Pfizer risk series) 03/02/2021   INFLUENZA VACCINE  01/25/2022   MAMMOGRAM  07/11/2022   TETANUS/TDAP  10/24/2030   Pneumonia Vaccine 31+ Years old  Completed   DEXA SCAN  Completed   Zoster Vaccines- Shingrix  Completed   HPV VACCINES  Aged Out    Physical Exam: Vitals:   02/07/22 1456  BP: (!) 142/98  Pulse: 81  Temp: 97.9 F (36.6 C)  SpO2: 97%  Weight: 148 lb 3.2 oz (67.2  kg)  Height: 5' (1.524 m)   Body mass index is 28.94 kg/m. Physical Exam Vitals and nursing note reviewed.  Constitutional:      General: She is not in acute distress.    Appearance: She is not diaphoretic.  HENT:     Head: Normocephalic and atraumatic.     Right Ear: Tympanic membrane, ear canal and external ear normal.     Left Ear: Tympanic membrane, ear canal and external ear normal.     Nose: Nose normal.     Mouth/Throat:     Mouth: Mucous membranes are moist.     Pharynx: Oropharynx is clear.  Eyes:     Conjunctiva/sclera: Conjunctivae normal.     Pupils: Pupils are equal, round, and reactive to light.  Neck:     Vascular: No JVD.  Cardiovascular:     Rate and Rhythm: Normal rate. Rhythm irregular.     Heart sounds: No murmur heard. Pulmonary:     Effort: Pulmonary effort is normal. No respiratory distress.     Breath sounds: Normal breath sounds. No wheezing.  Abdominal:     General: Bowel sounds are normal. There is no distension.     Palpations: Abdomen is soft.     Tenderness: There is no abdominal tenderness.  Musculoskeletal:     Cervical back: No rigidity or tenderness.     Right lower leg: No edema.     Left lower leg: No edema.  Lymphadenopathy:      Cervical: No cervical adenopathy.  Skin:    General: Skin is warm and dry.  Neurological:     Mental Status: She is alert and oriented to person, place, and time.  Psychiatric:        Mood and Affect: Mood normal.     Labs reviewed: Basic Metabolic Panel: Recent Labs    02/11/21 0000 05/10/21 0824 05/18/21 0000 09/28/21 0000 11/04/21 1144 01/06/22 1026  NA 143 140   < > 138 141 142  K 4.4 3.9   < > 4.2 4.6 4.7  CL 105 104   < > 102 105 103  CO2 26* 28   < > 27* 30 28  GLUCOSE  --  92  --   --  96 87  BUN 17 13   < > _0 CREATININE 0.7 0.76   < > 0.7 0.71 0.77  CALCIUM 9.8 9.8   < > 9.9 9.9 10.1  TSH 2.99  --   --   --   --   --    < > = values in this interval not displayed.   Liver Function Tests: Recent Labs    05/10/21 0824 09/28/21 0000 11/04/21 1144  AST _1 ALT _2 ALKPHOS 46 65 49  BILITOT 0.7  --  0.5  PROT 6.4*  --  6.2*  ALBUMIN 4.3 4.2 4.4   No results for input(s): "LIPASE", "AMYLASE" in the last 8760 hours. No results for input(s): "AMMONIA" in the last 8760 hours. CBC: Recent Labs    05/10/21 0824 05/18/21 0000 09/28/21 0000 11/04/21 1144 01/06/22 1026  WBC 7.7   < > 6.0 6.8 7.6  NEUTROABS 5.4  --   --  4.5  --   HGB 15.3*   < > 14.6 14.6 14.8  HCT 45.4   < > 44 43.8 44.5  MCV 92.1  --   --  91.8 92  PLT 193   < >  208 205 226   < > = values in this interval not displayed.   Lipid Panel: Recent Labs    02/11/21 0000 05/11/21 0128  CHOL 156 146  HDL 66 59  LDLCALC 75 70  TRIG 73 86  CHOLHDL  --  2.5   Lab Results  Component Value Date   HGBA1C 4.8 05/11/2021    Procedures since last visit: [place] (3-14 DAYS)  Result Date: 01/14/2022   At recording onset, the rhythm was sinus with second degree AV block, Mobitz type 1, but the dominant rhythm was atrial fibrillation, which was present in the last >11 days of recording and was still present at the end of the study   Atrial  fibrillation rate control is generally good, but there occasional periods of rapid ventricular response. Slow ventricular response is also seen, both during wake hours and sleep hours.   Pauses of 3 seconds are seen during sinus rhythm (due to second degree AV block) and during atrial fibrillation with slow ventricular response. The pauses were associated with symptoms.   There are rare PVCs and there is no complex ventricular arrhythmia. Abnormal event monitor with persistent atrial fibrillation and periods of severe bradycardia/pauses due to second degree AV block, Mobitz type I and slow ventricular response. Findings are consistent with significant AV node disease. Since symptoms are present, pacemaker therapy is indicated. Patch Wear Time:  13 days and 23 hours (2023-06-23T11:12:29-0400 to 2023-07-07T10:25:45-0400) Patient had a min HR of 22 bpm, max HR of 132 bpm, and avg HR of 65 bpm. Predominant underlying rhythm was Atrial Fibrillation/Flutter. First Degree AV Block was present. Atrial Fibrillation/Flutter occurred (81% burden), ranging from 34-132 bpm (avg of 66 bpm), the longest lasting 11 days 9 hours with an avg rate of 66 bpm. Atrial Fibrillation was present at de-activation of device. 2 Pauses occurred, the longest lasting 3 secs (20 bpm). Second Degree AV Block-Mobitz I ([place]enckebach) was present. Atrial Fibrillation/Flutter was detected within +/- 45 seconds of symptomatic patient event(s). Isolated SVEs were rare (<1.0%), and no SVE Couplets or SVE Triplets were present. Isolated VEs were rare (<1.0%), and no VE Couplets or VE Triplets were present. Previously Notified: MD notification criteria for First Documentation of Atrial Fibrillation met - Notified Cherylann Ratel., RN on 20 Dec 2021 at 9:38 AM EDT (BP). MD notification criteria for Slow Atrial Fibrillation met - notified Notified Cristopher Peru on 10 Jan 2022 at 1:32 PM CDT (DM).    Assessment/Plan  1. Essential hypertension Not on antihypertensive.  BP  slightly above goal but has a propensity towards orthostatic hypotension and fall risk Observe for now.   2. Mixed hyperlipidemia Lab Results  Component Value Date   LDLCALC 70 05/11/2021   Needs lipid panel on pravachol  3. Paroxysmal atrial fibrillation (HCC) Irregular on exam Following up with cardiology Continues on Eliquis   4. Senile osteopenia Continue Prolia Monitor BMP BMD q 2 years Weight bearing exercise as tolerated.   5. Hx of breast cancer No longer follows with oncology Continues with mammograms annually   6. Meningioma ([place]) MRI 5/23 stable Followed by neurosurgery   7. Gait abnormality Always use walker   8. Slow transit constipation Could try cutting back on miralax    Labs/tests ordered: lipid panel this week 8/17 Also CBC BMP in 6 months.  Next appt:  6 months with Dr Lyndel Safe.    Total time 69mn:  time greater than 50% of total time spent doing pt counseling  and coordination of care

## 2022-02-10 DIAGNOSIS — E785 Hyperlipidemia, unspecified: Secondary | ICD-10-CM | POA: Diagnosis not present

## 2022-02-10 DIAGNOSIS — E781 Pure hyperglyceridemia: Secondary | ICD-10-CM | POA: Diagnosis not present

## 2022-02-10 LAB — LIPID PANEL
Cholesterol: 142 (ref 0–200)
HDL: 62 (ref 35–70)
LDL Cholesterol: 64
Triglycerides: 81 (ref 40–160)

## 2022-02-10 LAB — HEPATIC FUNCTION PANEL
ALT: 49 U/L — AB (ref 7–35)
AST: 37 — AB (ref 13–35)
Alkaline Phosphatase: 65 (ref 25–125)
Bilirubin, Total: 0.6

## 2022-02-10 LAB — COMPREHENSIVE METABOLIC PANEL: Albumin: 4.3 (ref 3.5–5.0)

## 2022-02-11 ENCOUNTER — Encounter: Payer: Self-pay | Admitting: Adult Health

## 2022-02-15 DIAGNOSIS — Z85820 Personal history of malignant melanoma of skin: Secondary | ICD-10-CM | POA: Diagnosis not present

## 2022-02-15 DIAGNOSIS — L570 Actinic keratosis: Secondary | ICD-10-CM | POA: Diagnosis not present

## 2022-02-15 DIAGNOSIS — Z85828 Personal history of other malignant neoplasm of skin: Secondary | ICD-10-CM | POA: Diagnosis not present

## 2022-02-18 ENCOUNTER — Ambulatory Visit: Payer: Medicare PPO | Admitting: Cardiovascular Disease

## 2022-02-18 ENCOUNTER — Encounter: Payer: Self-pay | Admitting: Cardiovascular Disease

## 2022-02-18 VITALS — BP 136/86 | HR 75 | Ht 60.5 in | Wt 147.6 lb

## 2022-02-18 DIAGNOSIS — I10 Essential (primary) hypertension: Secondary | ICD-10-CM

## 2022-02-18 DIAGNOSIS — I495 Sick sinus syndrome: Secondary | ICD-10-CM

## 2022-02-18 DIAGNOSIS — I4819 Other persistent atrial fibrillation: Secondary | ICD-10-CM | POA: Diagnosis not present

## 2022-02-18 DIAGNOSIS — D6869 Other thrombophilia: Secondary | ICD-10-CM

## 2022-02-18 DIAGNOSIS — Z8673 Personal history of transient ischemic attack (TIA), and cerebral infarction without residual deficits: Secondary | ICD-10-CM | POA: Diagnosis not present

## 2022-02-18 DIAGNOSIS — E7800 Pure hypercholesterolemia, unspecified: Secondary | ICD-10-CM | POA: Diagnosis not present

## 2022-02-18 NOTE — Progress Notes (Signed)
Cardiology Office Note:    Date:  02/18/2022   ID:  Jessica Mullins, DOB 15-Aug-1930, MRN [id number]  PCP:  Virgie Dad, MD  [facility] Cardiologist:  Sanda Klein, MD  [facility] Electrophysiologist:  None   Referring MD: Virgie Dad, MD   Chief Complaint  Patient presents with   Atrial Fibrillation     History of Present Illness:    Jessica Mullins is a 88 y.o. female with a hx of paroxysmal atrial fibrillation without significant structural heart disease, more recently with persistent atrial fibrillation, remote history of vasovagal syncope, history of stroke 2022, cerebral meningioma.   In November 2022 she developed problems with visual changes eventually with severe diplopia.  MRI of the brain showed a small punctate acute left parietal infarction, not an area that would be expected to contribute to visual changes.  CT angiogram of the head and neck did not show any large vessel stenoses and there was mild for age atherosclerosis in the intracranial vessels without significant stenoses.  The echocardiogram showed unchanged findings with normal LV systolic function LA dilated left atrium in the absence of hemodynamically significant valvular abnormalities (significant mitral annular calcification with caseous change reported).  Her symptoms have resolved.  She was seen in the emergency room for problems with her balance in early May.  She was in sinus bradycardia at 51 bpm.  On V[place] 20 she had dizziness and blurry vision with a heart rate reportedly of only 39 bpm.  She complains of fatigue and poor balance.  We stopped her diltiazem.  The event monitor showed persistent atrial fibrillation.  She was still taking flecainide and we set her up for cardioversion which was successfully performed on 01/11/2022.  She presents today in atrial fibrillation with controlled ventricular response again.  She remains arrhythmia unaware so she cannot tell us when the arrhythmia  recurred.  She did not feel any improvement following her cardioversion.  She remains tired and feels that her balance is poor.  She has not had falls or injuries or bleeding problems.  She denies orthopnea, PND or lower extremity edema and has never complained of chest pain.  Her blood pressure was a little high when she first checked in, but was normal when rechecked a few minutes later.  Her previous event monitor October 2021 did show a tendency to bradycardia with heart rates frequently in the 50s and borderline criteria for chronotropic incompetence.  Her average heart rate was 64.  The maximum achieved heart rate and sinus rhythm was 103 (75% of predicted for age).  Most recent metabolic parameters include LDL of 70, HDL 59 and hemoglobin A1c of 4.8%.  Most recent creatinine was normal at 0.71 and potassium 4.6.  She has been compliant with anticoagulation without any interruption.  She denies any bleeding complications.  She has had unsteady gait ever since her A-fib onset but has not had serious injuries.  One day she was trying to straighten out to the cover on her arm chair and fell forward smacking her face on the back of the chair, but without serious consequences.  Past Medical History:  Diagnosis Date   Allergy    Ankle fracture, left    Atrial fibrillation [facility])    Per [facility] New Patient Packet    Breast cancer [facility]) 1115/16   left    Bursitis of right shoulder    Cataract    Family history of cancer    Fracture of right  wrist    GERD (gastroesophageal reflux disease)    Hyperlipidemia    Hypertension    Lumbar radiculitis    Per [place] New Patient Packet    Meningioma [facility])    Per [place] New Patient Packet    Skin cancer 2000   melanoma and basal cell   Stress fracture    Right Heel, Per [place] New Patient Packet    TIA (transient ischemic attack) 2022   Vaso vagal episode    during preparation for colonoscopy    Past Surgical History:  Procedure Laterality Date    ABDOMINAL HYSTERECTOMY  1988   TAH/BSO--FIBROIDS   APPENDECTOMY     BREAST LUMPECTOMY     B/L--FCS   BREAST LUMPECTOMY WITH RADIOACTIVE SEED AND SENTINEL LYMPH NODE BIOPSY Left 06/11/2015   Procedure: LEFT BREAST LUMPECTOMY WITH RADIOACTIVE SEED AND LEFT SENTINEL LYMPH NODE MAPPING;  Surgeon: Erroll Luna, MD;  Location: [place];  Service: General;  Laterality: Left;   CARDIOVERSION N/A 01/11/2022   Procedure: CARDIOVERSION;  Surgeon: Buford Dresser, MD;  Location: [place];  Service: Cardiovascular;  Laterality: N/A;   CATARACT EXTRACTION  2015   COLONOSCOPY  2010   DG  BONE DENSITY ([place] HX)     EYE SURGERY Bilateral    cataracts   fiberadenoma Bilateral 1978, 1980   GANGLION CYST EXCISION     L  hand   Lipiflow procedure      Current Medications: Current Meds  Medication Sig   AMBULATORY NON FORMULARY MEDICATION [place] use daily as directed.  Disp 1 Leg pain M79.604   Biotin 5 MG CAPS Take 5 mg by mouth daily.    Calcium Carbonate-Vitamin D 600-400 MG-UNIT tablet Take 1 tablet by mouth daily.   Cholecalciferol (VITAMIN D3) 1000 UNITS CAPS Take 1,000 Units by mouth daily.   cycloSPORINE (RESTASIS) 0.05 % ophthalmic emulsion Place 1 drop into both eyes 2 (two) times daily.   ELIQUIS 5 MG TABS tablet TAKE ONE TABLET BY MOUTH TWICE DAILY   Multiple Vitamins-Minerals (PRESERVISION AREDS) TABS Take 2 tablets by mouth daily.   Omega-3 Fatty Acids (FISH OIL PO) Take 1 Capful by mouth daily.   polyethylene glycol (MIRALAX / GLYCOLAX) 17 g packet Take 17 g by mouth daily.   pravastatin (PRAVACHOL) 40 MG tablet TAKE ONE TABLET BY MOUTH ONCE DAILY   Psyllium (METAMUCIL PO) Take 1 Dose by mouth daily. 1 teaspoon   Turmeric 500 MG CAPS Take 500 capsules by mouth daily.   vitamin B-12 (CYANOCOBALAMIN) 1000 MCG tablet Take 1 tablet (1,000 mcg total) by mouth daily.   [DISCONTINUED] flecainide (TAMBOCOR) 50 MG tablet TAKE 1 TABLET BY MOUTH TWICE DAILY.     Allergies:   Sulfa  antibiotics, Tape, Glucosamine forte [nutritional supplements], and Latex   Social History   Socioeconomic History   Marital status: Widowed    Spouse name: Not on file   Number of children: 2   Years of education: Not on file   Highest education level: Master's degree (e.g., MA, MS, MEng, MEd, [name], [name])  Occupational History   Occupation: Product manager: RETIRED    Comment: retired  Tobacco Use   Smoking status: Never   Smokeless tobacco: Never  Vaping Use   Vaping Use: Never used  Substance and Sexual Activity   Alcohol use: Yes    Comment: rare   Drug use: No   Sexual activity: Never  Other Topics Concern   Not on file  Social History  Narrative   Patient is widowed.  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    One son one daughter   1 caffeinated beverage daily   She is right-handed.    She works out everyday to a televised [employer] 30 minute program.   No tobacco or alcohol         Per [place] New Patient Packet Abstracted on 01/03/2020      Diet: Left blank       Caffeine: Yes      Married, if yes what year: Widowed, married in [place] you live in a house, apartment, assisted living, condo, trailer, ect: Apartment      Is it one or more stories: One stories, one person       Pets: 1 Neurosurgeon      Current/Past profession: Pharmacist, hospital      Highest level or education completed: MA of Education       Exercise:   Yes             Type and how often: Cardio, chair aerobics, 3-4 times weekly          Living Will: Yes   DNR: Yes   POA/HPOA: Yes      Functional Status:   Do you have difficulty bathing or dressing yourself? Left blank   Do you have difficulty preparing food or eating? Left blank   Do you have difficulty managing your medications? Left blank   Do you have difficulty managing your finances? Left blank   Do you have difficulty affording your medications? Left blank   Social Determinants of Health   Financial Resource Strain: Low Risk   (11/14/2019)   Overall Financial Resource Strain (CARDIA)    Difficulty of Paying Living Expenses: Not hard at all  Food Insecurity: No Food Insecurity (11/14/2019)   Hunger Vital Sign    Worried About Running Out of Food in the Last Year: Never true    Ran Out of Food in the Last Year: Never true  Transportation Needs: No Transportation Needs (11/14/2019)   PRAPARE - Hydrologist (Medical): No    Lack of Transportation (Non-Medical): No  Physical Activity: Not on file  Stress: Not on file  Social Connections: Not on file     Family History: The patient's family history includes Cancer in her mother; Coronary artery disease in her brother; Diabetes in her brother and maternal aunt; Heart disease in her brother; Heart disease (age of onset: 57) in her father; High Cholesterol in her son; Hyperlipidemia in her brother; Hypertension in her brother and son; Lung cancer in an other family member; Lung cancer (age of onset: 24) in her sister; Thrombosis in an other family member.  ROS:   Please see the history of present illness.     All other systems reviewed and are negative.  EKGs/Labs/Other Studies Reviewed:    The following studies were reviewed today: Event monitor 09/02-09/15  ECHO 05/11/2021   1. A small pericardial effusion is present. The pericardial effusion is  anterior to the right ventricle. There is no RV or RA collapse suggestive  of increased pericardial pressures.   2. The inferior vena cava is normal in size with greater than 50%  respiratory variability, suggesting right atrial pressure of 3 mmHg.   3. Caseous mitral annular calcifification noted. The mitral valve is  abnormal. No evidence of mitral valve regurgitation. No evidence of  mitral  stenosis.   4. Left ventricular ejection fraction, by estimation, is 55 to 60%. The  left ventricle has normal function. The left ventricle has no regional  wall motion abnormalities. There is mild  concentric left ventricular  hypertrophy. Left ventricular diastolic  parameters are consistent with Grade II diastolic dysfunction  (pseudonormalization).   5. Right ventricular systolic function is normal. The right ventricular  size is normal. There is normal pulmonary artery systolic pressure.   6. Left atrial size was mildly dilated.   7. The aortic valve was not well visualized. Aortic valve regurgitation  is not visualized. No aortic stenosis is present.   Comparison(s): Prior images unable to be directly viewed, comparison made  by report only. Similar from prior report.   EKG:  EKG is not ordered today.  Personally reviewed the ECG from  01/11/2022 which was performed immediately post cardioversion shows sinus rhythm, nonspecific T wave changes, narrow QRS, normal QTc 404 ms.  There is borderline left axis deviation. Recent Labs: 01/06/2022: BUN 16; Creatinine, Ser 0.77; Hemoglobin 14.8; Platelets 226; Potassium 4.7; Sodium 142 02/10/2022: ALT 49  Recent Lipid Panel    Component Value Date/Time   CHOL 142 02/10/2022 0000   TRIG 81 02/10/2022 0000   HDL 62 02/10/2022 0000   CHOLHDL 2.5 05/11/2021 0128   VLDL 17 05/11/2021 0128   LDLCALC 64 02/10/2022 0000   LDLDIRECT 153.4 11/16/2009 0000    Physical Exam:    VS:  BP 136/86   Pulse 75   Ht 5' 0.5" (1.537 m)   Wt 147 lb 9.6 oz (67 kg)   SpO2 96%   BMI 28.35 kg/m     Wt Readings from Last 3 Encounters:  02/18/22 147 lb 9.6 oz (67 kg)  02/07/22 148 lb 3.2 oz (67.2 kg)  01/11/22 148 lb (67.1 kg)      General: Alert, oriented x3, no distress, overweight Head: no evidence of trauma, PERRL, EOMI, no exophtalmos or lid lag, no myxedema, no xanthelasma; normal ears, nose and oropharynx Neck: normal jugular venous pulsations and no hepatojugular reflux; brisk carotid pulses without delay and no carotid bruits Chest: clear to auscultation, no signs of consolidation by percussion or palpation, normal fremitus, symmetrical  and full respiratory excursions Cardiovascular: normal position and quality of the apical impulse, regular rhythm, normal first and second heart sounds, no murmurs, rubs or gallops Abdomen: no tenderness or distention, no masses by palpation, no abnormal pulsatility or arterial bruits, normal bowel sounds, no hepatosplenomegaly Extremities: no clubbing, cyanosis or edema; 2+ radial, ulnar and brachial pulses bilaterally; 2+ right femoral, posterior tibial and dorsalis pedis pulses; 2+ left femoral, posterior tibial and dorsalis pedis pulses; no subclavian or femoral bruits Neurological: grossly nonfocal Psych: Normal mood and affect     ASSESSMENT:    1. Persistent atrial fibrillation ([place])   2. SSS (sick sinus syndrome) ([place])   3. Acquired thrombophilia ([place])   4. Essential hypertension   5. History of ischemic stroke   6. Hypercholesterolemia       PLAN:    In order of problems listed above:  AFib: Flecainide is no longer helping her maintain normal rhythm and we had to stop AV nodal blocking agents due to symptomatic bradycardia.   Even last year she already had evidence of chronotropic incompetence and we had broached the subject of possible pacemaker.  At this point our options are to start treatment with amiodarone as an outpatient (this would certainly precipitate the need for  pacemaker implantation due to symptomatic bradycardia), hospitalization to initiate dofetilide (probably followed by need for pacemaker as well) or treating her as well rate controlled atrial fibrillation with observation.  She is not sure what she would want to do, none of these options sound appealing to her.  Encouraged her to discuss these options with her family as well and will bring her back for more consultation before the end of the year. SSS/AV conduction abnormalities: We had to stop all AV node blocking agents due to bradycardia.  Currently well rate controlled but anticipate that as time passes  she will again develop slow ventricular response even if we do not put her on additional antiarrhythmics. Flecainide: No longer helping.  We will discontinue this medication. Anticoagulation: Denies falls or bleeding complications. HTN: Adequate control.  She has history of orthostatic hypotension and we may have to avoid "perfect" "control. History of CVA: Demonstrated on CT, but not really correlating with other neuro symptoms.  Distribution is not typical for embolic events. HLP: All lipid parameters in desirable range on the current medications.  Medication Adjustments/Labs and Tests Ordered: Current medicines are reviewed at length with the patient today.  Concerns regarding medicines are outlined above.  No orders of the defined types were placed in this encounter.   No orders of the defined types were placed in this encounter.    Patient Instructions  Medication Instructions:  STOP the Flecainide  *If you need a refill on your cardiac medications before your next appointment, please call your pharmacy*   Lab Work: None ordered If you have labs (blood work) drawn today and your tests are completely normal, you will receive your results only by: [place] (if you have MyChart) OR A paper copy in the mail If you have any lab test that is abnormal or we need to change your treatment, we will call you to review the results.   Testing/Procedures: None ordered   Follow-Up: At [facility], you and your health needs are our priority.  As part of our continuing mission to provide you with exceptional heart care, we have created designated Provider Care Teams.  These Care Teams include your primary Cardiologist (physician) and Advanced Practice Providers (APPs -  Physician Assistants and Nurse Practitioners) who all work together to provide you with the care you need, when you need it.  We recommend signing up for the patient portal called "MyChart".  Sign up information is  provided on this After Visit Summary.  MyChart is used to connect with patients for Virtual Visits (Telemedicine).  Patients are able to view lab/test results, encounter notes, upcoming appointments, etc.  Non-urgent messages can be sent to your provider as well.   To learn more about what you can do with MyChart, go to [email or web address].    Your next appointment:   4 month(s)  The format for your next appointment:   In Person  Provider:   Sanda Klein, MD {    Important Information About Sugar         Signed, Sanda Klein, MD  02/18/2022 1:35 PM    [place]

## 2022-02-18 NOTE — Patient Instructions (Signed)
Medication Instructions:  STOP the Flecainide  *If you need a refill on your cardiac medications before your next appointment, please call your pharmacy*   Lab Work: None ordered If you have labs (blood work) drawn today and your tests are completely normal, you will receive your results only by: [place] (if you have MyChart) OR A paper copy in the mail If you have any lab test that is abnormal or we need to change your treatment, we will call you to review the results.   Testing/Procedures: None ordered   Follow-Up: At [facility], you and your health needs are our priority.  As part of our continuing mission to provide you with exceptional heart care, we have created designated Provider Care Teams.  These Care Teams include your primary Cardiologist (physician) and Advanced Practice Providers (APPs -  Physician Assistants and Nurse Practitioners) who all work together to provide you with the care you need, when you need it.  We recommend signing up for the patient portal called "MyChart".  Sign up information is provided on this After Visit Summary.  MyChart is used to connect with patients for Virtual Visits (Telemedicine).  Patients are able to view lab/test results, encounter notes, upcoming appointments, etc.  Non-urgent messages can be sent to your provider as well.   To learn more about what you can do with MyChart, go to [email or web address].    Your next appointment:   4 month(s)  The format for your next appointment:   In Person  Provider:   Sanda Klein, MD {    Important Information About Sugar

## 2022-03-01 DIAGNOSIS — H43813 Vitreous degeneration, bilateral: Secondary | ICD-10-CM | POA: Diagnosis not present

## 2022-03-01 DIAGNOSIS — H35033 Hypertensive retinopathy, bilateral: Secondary | ICD-10-CM | POA: Diagnosis not present

## 2022-03-01 DIAGNOSIS — H538 Other visual disturbances: Secondary | ICD-10-CM | POA: Diagnosis not present

## 2022-03-01 DIAGNOSIS — H353132 Nonexudative age-related macular degeneration, bilateral, intermediate dry stage: Secondary | ICD-10-CM | POA: Diagnosis not present

## 2022-03-16 DIAGNOSIS — Z85820 Personal history of malignant melanoma of skin: Secondary | ICD-10-CM | POA: Diagnosis not present

## 2022-03-16 DIAGNOSIS — L989 Disorder of the skin and subcutaneous tissue, unspecified: Secondary | ICD-10-CM | POA: Diagnosis not present

## 2022-03-16 DIAGNOSIS — Z85828 Personal history of other malignant neoplasm of skin: Secondary | ICD-10-CM | POA: Diagnosis not present

## 2022-03-16 DIAGNOSIS — D492 Neoplasm of unspecified behavior of bone, soft tissue, and skin: Secondary | ICD-10-CM | POA: Diagnosis not present

## 2022-03-16 DIAGNOSIS — L570 Actinic keratosis: Secondary | ICD-10-CM | POA: Diagnosis not present

## 2022-03-29 ENCOUNTER — Other Ambulatory Visit (HOSPITAL_BASED_OUTPATIENT_CLINIC_OR_DEPARTMENT_OTHER): Payer: Self-pay

## 2022-03-29 MED ORDER — FLUAD QUADRIVALENT 0.5 ML IM PRSY
PREFILLED_SYRINGE | INTRAMUSCULAR | 0 refills | Status: DC
Start: 2022-03-29 — End: 2022-04-20
  Filled 2022-03-29: qty 0.5, 1d supply, fill #0

## 2022-04-07 DIAGNOSIS — H353131 Nonexudative age-related macular degeneration, bilateral, early dry stage: Secondary | ICD-10-CM | POA: Diagnosis not present

## 2022-04-07 DIAGNOSIS — H52203 Unspecified astigmatism, bilateral: Secondary | ICD-10-CM | POA: Diagnosis not present

## 2022-04-07 DIAGNOSIS — H16223 Keratoconjunctivitis sicca, not specified as Sjogren's, bilateral: Secondary | ICD-10-CM | POA: Diagnosis not present

## 2022-04-19 ENCOUNTER — Encounter: Payer: Self-pay | Admitting: Internal Medicine

## 2022-04-19 ENCOUNTER — Ambulatory Visit: Payer: Medicare PPO | Admitting: Internal Medicine

## 2022-04-19 VITALS — BP 130/82 | HR 80 | Temp 97.9°F | Resp 18 | Ht 61.0 in | Wt 146.6 lb

## 2022-04-19 DIAGNOSIS — D329 Benign neoplasm of meninges, unspecified: Secondary | ICD-10-CM | POA: Diagnosis not present

## 2022-04-19 DIAGNOSIS — R42 Dizziness and giddiness: Secondary | ICD-10-CM

## 2022-04-19 DIAGNOSIS — E782 Mixed hyperlipidemia: Secondary | ICD-10-CM | POA: Diagnosis not present

## 2022-04-19 DIAGNOSIS — I480 Paroxysmal atrial fibrillation: Secondary | ICD-10-CM

## 2022-04-19 DIAGNOSIS — R001 Bradycardia, unspecified: Secondary | ICD-10-CM

## 2022-04-19 DIAGNOSIS — R269 Unspecified abnormalities of gait and mobility: Secondary | ICD-10-CM

## 2022-04-19 NOTE — Patient Instructions (Signed)
I will make referral to Neurology. Do lab work [employer] Will do therapy referral after your appointment with Cardiology

## 2022-04-20 NOTE — Progress Notes (Signed)
Location:  [place] of Service:   Clinic  Provider: Veleta Miners  Code Status: DNR Goals of Care:     04/19/2022    3:59 PM  Advanced Directives  Does Patient Have a Medical Advance Directive? Yes  Type of Paramedic of [place];Living will;Out of facility DNR (pink MOST or yellow form)  Does patient want to make changes to medical advance directive? No - Patient declined  Copy of [place] in Chart? Yes - validated most recent copy scanned in chart (See row information)     Chief Complaint  Patient presents with   Acute Visit    Dizziness and balance issue    HPI: Patient is a 88 y.o. female seen today for an acute visit for Dizziness  [place]  Patient has h/o Meningoma Diagnosed with MRI in 4/21 Has seen Neurosurgery H/o Low Back Pain with Radiculopathy follows with Neurology H/o Breast Cancer Invasive Ductal Carcinoma diagnosed in 2016 Osteopenia Last DEXA was -1.6 in 2020 H/o PAF  Follows with Cardiology On Eliquis Also h/o Bradycardia Hypertension with Tendency for Orthostatic Hypotension  Dizziness It is chronic issue Has seen Dr Tomi Likens and Cardiology She says it is getting worse She describes her dizziness is more like feeling unstable.  And fear of fall.  She denies any vertigo.  No change with position.  Unable to walk without her walker .  She also wanted another opinion from another neurologist Has not had any falls Being careful with walking. Not very active due to fear of falling PAF Patient is getting to see by Dr. Herbie Saxon this Friday.  She has decided to get admitted in the hospital for antiarrhythmic medications.  Possibly with pacemaker due to her history of bradycardia.  Daughter in town who helps her  Past Medical History:  Diagnosis Date   Allergy    Ankle fracture, left    Atrial fibrillation ([place])    Per [facility] New Patient Packet    Breast cancer ([place]) 1115/16    left    Bursitis of right shoulder    Cataract    Family history of cancer    Fracture of right wrist    GERD (gastroesophageal reflux disease)    Hyperlipidemia    Hypertension    Lumbar radiculitis    Per [place] New Patient Packet    Meningioma [facility])    Per [place] New Patient Packet    Skin cancer 2000   melanoma and basal cell   Stress fracture    Right Heel, Per [place] New Patient Packet    TIA (transient ischemic attack) 2022   Vaso vagal episode    during preparation for colonoscopy    Past Surgical History:  Procedure Laterality Date   ABDOMINAL HYSTERECTOMY  1988   TAH/BSO--FIBROIDS   APPENDECTOMY     BREAST LUMPECTOMY     B/L--[facility]   BREAST LUMPECTOMY WITH RADIOACTIVE SEED AND SENTINEL LYMPH NODE BIOPSY Left 06/11/2015   Procedure: LEFT BREAST LUMPECTOMY WITH RADIOACTIVE SEED AND LEFT SENTINEL LYMPH NODE MAPPING;  Surgeon: Erroll Luna, MD;  Location: [place];  Service: General;  Laterality: Left;   CARDIOVERSION N/A 01/11/2022   Procedure: CARDIOVERSION;  Surgeon: Buford Dresser, MD;  Location: [place];  Service: Cardiovascular;  Laterality: N/A;   CATARACT EXTRACTION  2015   COLONOSCOPY  2010   DG  BONE DENSITY ([place] HX)     EYE SURGERY Bilateral  cataracts   fiberadenoma Bilateral 1978, 1980   GANGLION CYST EXCISION     L  hand   Lipiflow procedure      Allergies  Allergen Reactions   Sulfa Antibiotics Other (See Comments)    As a child, rigid as a stick and not responsive   Tape Other (See Comments)    Blisters, Please use "paper" tape only for short periods of time   Glucosamine Forte [Nutritional Supplements] Rash   Latex Rash    Outpatient Encounter Medications as of 04/19/2022  Medication Sig   acetaminophen (TYLENOL) 325 MG tablet Take 325 mg by mouth every 6 (six) hours as needed for moderate pain. (Patient not taking: Reported on 02/18/2022)   AMBULATORY NON FORMULARY MEDICATION Walker use daily as directed.  Disp 1 Leg pain M79.604    Biotin 5 MG CAPS Take 5 mg by mouth daily.    Calcium Carbonate-Vitamin D 600-400 MG-UNIT tablet Take 1 tablet by mouth daily.   Cholecalciferol (VITAMIN D3) 1000 UNITS CAPS Take 1,000 Units by mouth daily.   cycloSPORINE (RESTASIS) 0.05 % ophthalmic emulsion Place 1 drop into both eyes 2 (two) times daily.   ELIQUIS 5 MG TABS tablet TAKE ONE TABLET BY MOUTH TWICE DAILY   influenza vaccine adjuvanted (FLUAD QUADRIVALENT) 0.5 ML injection Inject into the muscle.   ketoconazole (NIZORAL) 2 % cream APPLY TO AFFECTED AREA ONCE DAILY AS NEEDED FOR ITCHING. (Patient not taking: Reported on 02/18/2022)   Multiple Vitamins-Minerals (PRESERVISION AREDS) TABS Take 2 tablets by mouth daily.   Omega-3 Fatty Acids (FISH OIL PO) Take 1 Capful by mouth daily.   polyethylene glycol (MIRALAX / GLYCOLAX) 17 g packet Take 17 g by mouth daily.   pravastatin (PRAVACHOL) 40 MG tablet TAKE ONE TABLET BY MOUTH ONCE DAILY   Psyllium (METAMUCIL PO) Take 1 Dose by mouth daily. 1 teaspoon   Turmeric 500 MG CAPS Take 500 capsules by mouth daily.   vitamin B-12 (CYANOCOBALAMIN) 1000 MCG tablet Take 1 tablet (1,000 mcg total) by mouth daily.   No facility-administered encounter medications on file as of 04/19/2022.    Review of Systems:  Review of Systems  Constitutional:  Positive for activity change. Negative for appetite change.  HENT: Negative.    Respiratory:  Negative for cough and shortness of breath.   Cardiovascular:  Negative for leg swelling.  Gastrointestinal:  Negative for constipation.  Genitourinary: Negative.   Musculoskeletal:  Positive for gait problem. Negative for arthralgias and myalgias.  Skin: Negative.   Neurological:  Positive for dizziness and weakness.  Psychiatric/Behavioral:  Negative for confusion, dysphoric mood and sleep disturbance.     Health Maintenance  Topic Date Due   Medicare Annual Wellness (AWV)  10/25/2013   COVID-19 Vaccine (5 - Pfizer risk series) 03/02/2021    MAMMOGRAM  07/11/2022   TETANUS/TDAP  10/24/2030   Pneumonia Vaccine 44+ Years old  Completed   INFLUENZA VACCINE  Completed   DEXA SCAN  Completed   Zoster Vaccines- Shingrix  Completed   HPV VACCINES  Aged Out    Physical Exam: Vitals:   04/19/22 1555  BP: 130/82  Pulse: 80  Resp: 18  Temp: 97.9 F (36.6 C)  TempSrc: Temporal  SpO2: 97%  Weight: 146 lb 9.6 oz (66.5 kg)  Height: '5\' 1"'$  (1.549 m)   Body mass index is 27.7 kg/m. Physical Exam Vitals reviewed.  Constitutional:      Appearance: Normal appearance.  HENT:     Head: Normocephalic.  Nose: Nose normal.     Mouth/Throat:     Mouth: Mucous membranes are moist.     Pharynx: Oropharynx is clear.  Eyes:     Pupils: Pupils are equal, round, and reactive to light.  Cardiovascular:     Rate and Rhythm: Normal rate. Rhythm irregular.     Pulses: Normal pulses.     Heart sounds: Normal heart sounds. No murmur heard. Pulmonary:     Effort: Pulmonary effort is normal.     Breath sounds: Normal breath sounds.  Abdominal:     General: Abdomen is flat. Bowel sounds are normal.     Palpations: Abdomen is soft.  Musculoskeletal:        General: Swelling present.     Cervical back: Neck supple.  Skin:    General: Skin is warm.  Neurological:     General: No focal deficit present.     Mental Status: She is alert and oriented to person, place, and time.     Comments: Did not get dizzy with position change Does have Proximal weakness in her Legs   Psychiatric:        Mood and Affect: Mood normal.        Thought Content: Thought content normal.     Labs reviewed: Basic Metabolic Panel: Recent Labs    05/10/21 0824 05/18/21 0000 09/28/21 0000 11/04/21 1144 01/06/22 1026  NA 140   < > 138 141 142  K 3.9   < > 4.2 4.6 4.7  CL 104   < > 102 105 103  CO2 28   < > 27* 30 28  GLUCOSE 92  --   --  96 87  BUN 13   < > '18 17 16  '$ CREATININE 0.76   < > 0.7 0.71 0.77  CALCIUM 9.8   < > 9.9 9.9 10.1   < > =  values in this interval not displayed.   Liver Function Tests: Recent Labs    05/10/21 0824 09/28/21 0000 11/04/21 1144 02/10/22 0000  AST '19 16 18 '$ 37*  ALT '13 12 21 '$ 49*  ALKPHOS 46 65 49 65  BILITOT 0.7  --  0.5  --   PROT 6.4*  --  6.2*  --   ALBUMIN 4.3 4.2 4.4 4.3   No results for input(s): "LIPASE", "AMYLASE" in the last 8760 hours. No results for input(s): "AMMONIA" in the last 8760 hours. CBC: Recent Labs    05/10/21 0824 05/18/21 0000 09/28/21 0000 11/04/21 1144 01/06/22 1026  WBC 7.7   < > 6.0 6.8 7.6  NEUTROABS 5.4  --   --  4.5  --   HGB 15.3*   < > 14.6 14.6 14.8  HCT 45.4   < > 44 43.8 44.5  MCV 92.1  --   --  91.8 92  PLT 193   < > 208 205 226   < > = values in this interval not displayed.   Lipid Panel: Recent Labs    05/11/21 0128 02/10/22 0000  CHOL 146 142  HDL 59 62  LDLCALC 70 64  TRIG 86 81  CHOLHDL 2.5  --    Lab Results  Component Value Date   HGBA1C 4.8 05/11/2021    Procedures since last visit: No results found.  Assessment/Plan 1. Dizziness Combination of Meningoma and PAF and Deconditioning  ? Unstable gait ? Fear of falling and overall Decline CBC,CMP ordered  Will make another Neurology Referal per her request Will  hold therapy till she decides on her Cardiac issues Possible will need AL Placement if continues with decline  2. Paroxysmal atrial fibrillation ([place]) Plan to see cardiology Fri Off most of her Antiarrythmic meds On Eliquis Has failed Cardioversion   Other issues History of CVA (cerebrovascular accident) On Eliquis Statin Aspirin not added per Neurology   History of breast cancer In Remission Follow with Oncology PRN Annually Mammogram   Chronic bilateral low back pain without sciatica     Essential hypertension Off all meds Pure hypercholesterolemia Continue Statin LDL 70 in 11/22  Lung nodule No Further Follow up needed   Meningioma ([place]) stable   Constipation Doing well with  Miralax PRN   All Labs reviewed and they were normal     Labs/tests ordered:  CBC,CMP, Lipid pAnel Next appt:  05/17/2022

## 2022-04-21 DIAGNOSIS — E785 Hyperlipidemia, unspecified: Secondary | ICD-10-CM | POA: Diagnosis not present

## 2022-04-21 DIAGNOSIS — I480 Paroxysmal atrial fibrillation: Secondary | ICD-10-CM | POA: Diagnosis not present

## 2022-04-21 DIAGNOSIS — R42 Dizziness and giddiness: Secondary | ICD-10-CM | POA: Diagnosis not present

## 2022-04-21 LAB — COMPREHENSIVE METABOLIC PANEL
Albumin: 4 (ref 3.5–5.0)
Calcium: 9.8 (ref 8.7–10.7)
Globulin: 1.7
eGFR: 71

## 2022-04-21 LAB — CBC AND DIFFERENTIAL
HCT: 46 (ref 36–46)
Hemoglobin: 15.4 (ref 12.0–16.0)
Platelets: 193 10*3/uL (ref 150–400)
WBC: 7.2

## 2022-04-21 LAB — BASIC METABOLIC PANEL
BUN: 16 (ref 4–21)
CO2: 27 — AB (ref 13–22)
Chloride: 106 (ref 99–108)
Creatinine: 0.8 (ref 0.5–1.1)
Glucose: 109
Potassium: 4.1 mEq/L (ref 3.5–5.1)
Sodium: 144 (ref 137–147)

## 2022-04-21 LAB — HEPATIC FUNCTION PANEL
ALT: 31 U/L (ref 7–35)
AST: 25 (ref 13–35)
Alkaline Phosphatase: 68 (ref 25–125)
Bilirubin, Total: 0.6

## 2022-04-21 LAB — LIPID PANEL
Cholesterol: 122 (ref 0–200)
HDL: 58 (ref 35–70)
LDL Cholesterol: 50
Triglycerides: 66 (ref 40–160)

## 2022-04-21 LAB — CBC: RBC: 5 (ref 3.87–5.11)

## 2022-04-22 ENCOUNTER — Ambulatory Visit: Payer: Medicare PPO | Attending: Cardiovascular Disease | Admitting: Cardiovascular Disease

## 2022-04-22 ENCOUNTER — Encounter: Payer: Self-pay | Admitting: Cardiovascular Disease

## 2022-04-22 VITALS — BP 115/83 | HR 89 | Ht 60.05 in | Wt 146.0 lb

## 2022-04-22 DIAGNOSIS — E7800 Pure hypercholesterolemia, unspecified: Secondary | ICD-10-CM | POA: Diagnosis not present

## 2022-04-22 DIAGNOSIS — D6869 Other thrombophilia: Secondary | ICD-10-CM | POA: Diagnosis not present

## 2022-04-22 DIAGNOSIS — Z8673 Personal history of transient ischemic attack (TIA), and cerebral infarction without residual deficits: Secondary | ICD-10-CM

## 2022-04-22 DIAGNOSIS — I495 Sick sinus syndrome: Secondary | ICD-10-CM | POA: Diagnosis not present

## 2022-04-22 DIAGNOSIS — I4819 Other persistent atrial fibrillation: Secondary | ICD-10-CM | POA: Diagnosis not present

## 2022-04-22 DIAGNOSIS — I10 Essential (primary) hypertension: Secondary | ICD-10-CM | POA: Diagnosis not present

## 2022-04-22 NOTE — Progress Notes (Signed)
Cardiology Office Note:    Date:  04/22/2022   ID:  Jessica Mullins, DOB 09-Apr-1931, MRN [id number]  PCP:  Jessica Dad, MD  [facility] Cardiologist:  Jessica Klein, MD  [facility] Electrophysiologist:  None   Referring MD: Jessica Dad, MD   Chief Complaint  Patient presents with   Irregular Heart Beat          History of Present Illness:    Jessica Mullins is a 88 y.o. female with a hx of paroxysmal atrial fibrillation without significant structural heart disease, more recently with persistent atrial fibrillation, remote history of vasovagal syncope, history of stroke 2022, cerebral meningioma.  Her major complaint remains fatigue.  More acutely she has noticed a very weak legs.  If she sits down for a longer period of time she has difficulty getting out of a chair.  She does not have any leg muscle pain and does not think that this is due to joint problems.  She has weakness in her quadriceps muscles and finds it hard to lift her leg up to place to cross her knees.  She has been participated in physical therapy (seated exercise) at [facility].  At her last appointment we discussed options for conservative management (continue anticoagulation, no need for rate control medications at this time) versus antiarrhythmic therapy with dofetilide (which requires hospitalization) or amiodarone (which has a much higher side effect profile and which would guarantee that she needs a pacemaker sooner.  Also discussed the fact that it is very likely that a pacemaker will be necessary at some point in the future due to evidence of AV node conduction disease (normal ventricular rates during atrial fibrillation without medications).  She is given some thought to these options and discuss with the family but has not really reached a conclusion.  Today she is much more preoccupied about her leg weakness.  She has not had any falls or bleeding problems.  She remains compliant with her  anticoagulant.  All her metabolic parameters are well controlled.  TSH was 2.99 a year ago.  On statin her LDL cholesterol is 64.   In November 2022 she developed problems with visual changes eventually with severe diplopia.  MRI of the brain showed a small punctate acute left parietal infarction, not an area that would be expected to contribute to visual changes.  CT angiogram of the head and neck did not show any large vessel stenoses and there was mild for age atherosclerosis in the intracranial vessels without significant stenoses.  The echocardiogram showed unchanged findings with normal LV systolic function LA dilated left atrium in the absence of hemodynamically significant valvular abnormalities (significant mitral annular calcification with caseous change reported).  Her symptoms have resolved.  She was seen in the emergency room for problems with her balance in early May.  She was in sinus bradycardia at 51 bpm.  On Jessica Mullins she had dizziness and blurry vision with a heart rate reportedly of only 39 bpm.  She complains of fatigue and poor balance.  We stopped her diltiazem.  The event monitor showed persistent atrial fibrillation.  She was still taking flecainide and we set her up for cardioversion which was successfully performed on 01/11/2022.  She presents today in atrial fibrillation with controlled ventricular response again.  She remains arrhythmia unaware so she cannot tell us when the arrhythmia recurred.  She did not feel any improvement following her cardioversion.  Her previous event monitor October 2021 did show  a tendency to bradycardia with heart rates frequently in the 50s and borderline criteria for chronotropic incompetence.  Her average heart rate was 64.  The maximum achieved heart rate and sinus rhythm was 103 (75% of predicted for age).    Past Medical History:  Diagnosis Date   Allergy    Ankle fracture, left    Atrial fibrillation ([place])    Per [place] New Patient Packet     Breast cancer ([place]) 1115/16   left    Bursitis of right shoulder    Cataract    Family history of cancer    Fracture of right wrist    GERD (gastroesophageal reflux disease)    Hyperlipidemia    Hypertension    Lumbar radiculitis    Per [place] New Patient Packet    Meningioma [facility])    Per [place] New Patient Packet    Skin cancer 2000   melanoma and basal cell   Stress fracture    Right Heel, Per [place] New Patient Packet    TIA (transient ischemic attack) 2022   Vaso vagal episode    during preparation for colonoscopy    Past Surgical History:  Procedure Laterality Date   ABDOMINAL HYSTERECTOMY  1988   TAH/BSO--FIBROIDS   APPENDECTOMY     BREAST LUMPECTOMY     B/L--FCS   BREAST LUMPECTOMY WITH RADIOACTIVE SEED AND SENTINEL LYMPH NODE BIOPSY Left 06/11/2015   Procedure: LEFT BREAST LUMPECTOMY WITH RADIOACTIVE SEED AND LEFT SENTINEL LYMPH NODE MAPPING;  Surgeon: Erroll Luna, MD;  Location: [facility];  Service: General;  Laterality: Left;   CARDIOVERSION N/A 01/11/2022   Procedure: CARDIOVERSION;  Surgeon: Buford Dresser, MD;  Location: [facility];  Service: Cardiovascular;  Laterality: N/A;   CATARACT EXTRACTION  2015   COLONOSCOPY  2010   DG  BONE DENSITY ([place] HX)     EYE SURGERY Bilateral    cataracts   fiberadenoma Bilateral 1978, 1980   GANGLION CYST EXCISION     L  hand   Lipiflow procedure      Current Medications: Current Meds  Medication Sig   acetaminophen (TYLENOL) 325 MG tablet Take 325 mg by mouth every 6 (six) hours as needed for moderate pain.   AMBULATORY NON FORMULARY MEDICATION Walker use daily as directed.  Disp 1 Leg pain M79.604   Biotin 5 MG CAPS Take 5 mg by mouth daily.    Calcium Carbonate-Vitamin D 600-400 MG-UNIT tablet Take 1 tablet by mouth daily.   Cholecalciferol (VITAMIN D3) 1000 UNITS CAPS Take 1,000 Units by mouth daily.   cycloSPORINE (RESTASIS) 0.05 % ophthalmic emulsion Place 1 drop into both eyes 2 (two) times daily.   ELIQUIS  5 MG TABS tablet TAKE ONE TABLET BY MOUTH TWICE DAILY   Multiple Vitamins-Minerals (PRESERVISION AREDS) TABS Take 2 tablets by mouth daily.   Omega-3 Fatty Acids (FISH OIL PO) Take 1 Capful by mouth daily.   polyethylene glycol (MIRALAX / GLYCOLAX) 17 g packet Take 17 g by mouth daily.   pravastatin (PRAVACHOL) 40 MG tablet TAKE ONE TABLET BY MOUTH ONCE DAILY   Psyllium (METAMUCIL PO) Take 1 Dose by mouth daily. 1 teaspoon   Turmeric 500 MG CAPS Take 500 capsules by mouth daily.   vitamin B-12 (CYANOCOBALAMIN) 1000 MCG tablet Take 1 tablet (1,000 mcg total) by mouth daily.     Allergies:   Sulfa antibiotics, Tape, Glucosamine forte [nutritional supplements], and Latex   Social History   Socioeconomic History   Marital status: Widowed  Spouse name: Not on file   Number of children: 2   Years of education: Not on file   Highest education level: Master's degree (e.g., MA, MS, MEng, MEd, [name], [name])  Occupational History   Occupation: Product manager: RETIRED    Comment: retired  Tobacco Use   Smoking status: Never   Smokeless tobacco: Never  Vaping Use   Vaping Use: Never used  Substance and Sexual Activity   Alcohol use: Yes    Comment: rare   Drug use: No   Sexual activity: Never  Other Topics Concern   Not on file  Social History Narrative   Patient is widowed.  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    One son one daughter   1 caffeinated beverage daily   She is right-handed.    She works out everyday to a televised [employer] 30 minute program.   No tobacco or alcohol         Per [place] New Patient Packet Abstracted on 01/03/2020      Diet: Left blank       Caffeine: Yes      Married, if yes what year: Widowed, married in [place] you live in a house, apartment, assisted living, condo, trailer, ect: Apartment      Is it one or more stories: One stories, one person       Pets: 1 Neurosurgeon      Current/Past profession: Pharmacist, hospital      Highest level or  education completed: MA of Education       Exercise:   Yes             Type and how often: Cardio, chair aerobics, 3-4 times weekly          Living Will: Yes   DNR: Yes   POA/HPOA: Yes      Functional Status:   Do you have difficulty bathing or dressing yourself? Left blank   Do you have difficulty preparing food or eating? Left blank   Do you have difficulty managing your medications? Left blank   Do you have difficulty managing your finances? Left blank   Do you have difficulty affording your medications? Left blank   Social Determinants of Health   Financial Resource Strain: Low Risk  (5/Mullins/2021)   Overall Financial Resource Strain (CARDIA)    Difficulty of Paying Living Expenses: Not hard at all  Food Insecurity: No Food Insecurity (5/Mullins/2021)   Hunger Vital Sign    Worried About Running Out of Food in the Last Year: Never true    Ran Out of Food in the Last Year: Never true  Transportation Needs: No Transportation Needs (5/Mullins/2021)   PRAPARE - Hydrologist (Medical): No    Lack of Transportation (Non-Medical): No  Physical Activity: Not on file  Stress: Not on file  Social Connections: Not on file     Family History: The patient's family history includes Cancer in her mother; Coronary artery disease in her brother; Diabetes in her brother and maternal aunt; Heart disease in her brother; Heart disease (age of onset: 84) in her father; High Cholesterol in her son; Hyperlipidemia in her brother; Hypertension in her brother and son; Lung cancer in an other family member; Lung cancer (age of onset: 55) in her sister; Thrombosis in an other family member.  ROS:   Please see the history of present illness.  All other systems reviewed and are negative.  EKGs/Labs/Other Studies Reviewed:    The following studies were reviewed today: Event monitor 09/02-09/15  ECHO 05/11/2021   1. A small pericardial effusion is present. The pericardial  effusion is  anterior to the right ventricle. There is no RV or RA collapse suggestive  of increased pericardial pressures.   2. The inferior vena cava is normal in size with greater than 50%  respiratory variability, suggesting right atrial pressure of 3 mmHg.   3. Caseous mitral annular calcifification noted. The mitral valve is  abnormal. No evidence of mitral valve regurgitation. No evidence of mitral  stenosis.   4. Left ventricular ejection fraction, by estimation, is 55 to 60%. The  left ventricle has normal function. The left ventricle has no regional  wall motion abnormalities. There is mild concentric left ventricular  hypertrophy. Left ventricular diastolic  parameters are consistent with Grade II diastolic dysfunction  (pseudonormalization).   5. Right ventricular systolic function is normal. The right ventricular  size is normal. There is normal pulmonary artery systolic pressure.   6. Left atrial size was mildly dilated.   7. The aortic valve was not well visualized. Aortic valve regurgitation  is not visualized. No aortic stenosis is present.   Comparison(s): Prior images unable to be directly viewed, comparison made  by report only. Similar from prior report.   EKG:  EKG is not ordered today.  Personally reviewed the ECG from  01/11/2022 which was performed immediately post cardioversion shows sinus rhythm, nonspecific T wave changes, narrow QRS, normal QTc 404 ms.  There is borderline left axis deviation. Recent Labs: 01/06/2022: BUN 16; Creatinine, Ser 0.77; Hemoglobin 14.8; Platelets 226; Potassium 4.7; Sodium 142 02/10/2022: ALT 49  Recent Lipid Panel    Component Value Date/Time   CHOL 142 02/10/2022 0000   TRIG 81 02/10/2022 0000   HDL 62 02/10/2022 0000   CHOLHDL 2.5 05/11/2021 0128   VLDL 17 05/11/2021 0128   LDLCALC 64 02/10/2022 0000   LDLDIRECT 153.4 11/16/2009 0000    Physical Exam:    VS:  BP 115/83 (BP Location: Left Arm, Patient Position: Sitting,  Cuff Size: Normal)   Pulse 89   Ht 5' 0.05" (1.525 m)   Wt 66.2 kg   SpO2 96%   BMI 28.47 kg/m     Wt Readings from Last 3 Encounters:  04/22/22 66.2 kg  04/19/22 66.5 kg  02/18/22 67 kg     General: Alert, oriented x3, no distress, Overweight Head: no evidence of trauma, PERRL, EOMI, no exophtalmos or lid lag, no myxedema, no xanthelasma; normal ears, nose and oropharynx Neck: normal jugular venous pulsations and no hepatojugular reflux; brisk carotid pulses without delay and no carotid bruits Chest: clear to auscultation, no signs of consolidation by percussion or palpation, normal fremitus, symmetrical and full respiratory excursions Cardiovascular: normal position and quality of the apical impulse, irregular rhythm, normal first and second heart sounds, no murmurs, rubs or gallops Abdomen: no tenderness or distention, no masses by palpation, no abnormal pulsatility or arterial bruits, normal bowel sounds, no hepatosplenomegaly Extremities: no clubbing, cyanosis or edema; 2+ radial, ulnar and brachial pulses bilaterally; 2+ right femoral, posterior tibial and dorsalis pedis pulses; 2+ left femoral, posterior tibial and dorsalis pedis pulses; no subclavian or femoral bruits Neurological: grossly nonfocal Psych: Normal mood and affect  ASSESSMENT:    1. Persistent atrial fibrillation ([place])   2. SSS (sick sinus syndrome) ([place])   3. Acquired thrombophilia ([place])  4. Essential hypertension   5. History of ischemic stroke   6. Hypercholesterolemia        PLAN:    In order of problems listed above:  AFib: Failed flecainide antiarrhythmic therapy with early recurrence of arrhythmia after cardioversion.   Even last year she already had evidence of chronotropic incompetence and we had broached the subject of possible pacemaker.  Has spontaneous ventricular rate control due to AV conduction disease.  Reviewed options: start treatment with amiodarone as an outpatient (this would  certainly precipitate the need for pacemaker implantation due to symptomatic bradycardia), hospitalization to initiate dofetilide (possibly followed by need for pacemaker as well) or treating her as well rate controlled atrial fibrillation with observation.  For the time being we will continue with a conservative approach.  I did point out that the longer she stays in atrial fibrillation the less chance we will have her return to normal rhythm.  She has a follow-up appointment scheduled with me in a couple of months. SSS/AV conduction abnormalities: We had to stop all AV node blocking agents due to bradycardia.  Currently well rate controlled but anticipate that as time passes she will again develop slow ventricular response even if we do not put her on additional antiarrhythmics. Anticoagulation: No bleeding complications.  No recent falls. HTN: Well-controlled.  Known history of orthostatic hypotension History of CVA: No current neurological events.  The weakness in her legs is bilateral and symmetrical.  Demonstrated on CT, but not really correlating with other neuro symptoms.  Distribution is not typical for embolic events. HLP: All lipid parameters in desirable range on the current medications.  We will hold her statin just in case this is causing her proximal leg muscle weakness.  Reevaluate at her appointment in January.  Medication Adjustments/Labs and Tests Ordered: Current medicines are reviewed at length with the patient today.  Concerns regarding medicines are outlined above.  Orders Placed This Encounter  Procedures   EKG 12-Lead    No orders of the defined types were placed in this encounter.    Patient Instructions  Medication Instructions:  HOLD the pravastatin until the your next appointment in January  *If you need a refill on your cardiac medications before your next appointment, please call your pharmacy*   Lab Work: None ordered  If you have labs (blood work) drawn  today and your tests are completely normal, you will receive your results only by: [place] (if you have MyChart) OR A paper copy in the mail If you have any lab test that is abnormal or we need to change your treatment, we will call you to review the results.   Testing/Procedures: None ordered   Follow-Up: At [facility], you and your health needs are our priority.  As part of our continuing mission to provide you with exceptional heart care, we have created designated Provider Care Teams.  These Care Teams include your primary Cardiologist (physician) and Advanced Practice Providers (APPs -  Physician Assistants and Nurse Practitioners) who all work together to provide you with the care you need, when you need it.  We recommend signing up for the patient portal called "MyChart".  Sign up information is provided on this After Visit Summary.  MyChart is used to connect with patients for Virtual Visits (Telemedicine).  Patients are able to view lab/test results, encounter notes, upcoming appointments, etc.  Non-urgent messages can be sent to your provider as well.   To learn more about what you can  do with MyChart, go to [email or web address].    Your next appointment:   Keep follow up as planned      Signed, Jessica Klein, MD  04/22/2022 10:40 AM    [place]

## 2022-04-22 NOTE — Patient Instructions (Signed)
Medication Instructions:  HOLD the pravastatin until the your next appointment in January  *If you need a refill on your cardiac medications before your next appointment, please call your pharmacy*   Lab Work: None ordered  If you have labs (blood work) drawn today and your tests are completely normal, you will receive your results only by: [place] (if you have MyChart) OR A paper copy in the mail If you have any lab test that is abnormal or we need to change your treatment, we will call you to review the results.   Testing/Procedures: None ordered   Follow-Up: At [facility], you and your health needs are our priority.  As part of our continuing mission to provide you with exceptional heart care, we have created designated Provider Care Teams.  These Care Teams include your primary Cardiologist (physician) and Advanced Practice Providers (APPs -  Physician Assistants and Nurse Practitioners) who all work together to provide you with the care you need, when you need it.  We recommend signing up for the patient portal called "MyChart".  Sign up information is provided on this After Visit Summary.  MyChart is used to connect with patients for Virtual Visits (Telemedicine).  Patients are able to view lab/test results, encounter notes, upcoming appointments, etc.  Non-urgent messages can be sent to your provider as well.   To learn more about what you can do with MyChart, go to [email or web address].    Your next appointment:   Keep follow up as planned

## 2022-04-25 ENCOUNTER — Encounter: Payer: Self-pay | Admitting: Internal Medicine

## 2022-04-26 ENCOUNTER — Telehealth: Payer: Self-pay | Admitting: Cardiovascular Disease

## 2022-04-26 NOTE — Telephone Encounter (Signed)
Calling in regards to patient afib. Please advise

## 2022-04-26 NOTE — Telephone Encounter (Signed)
Spoke to patient's RN Autumn at [facility] calling to report patient has been having frequent episodes of Afib.Stated she complains of dizziness and weakness.Stated she is presently ok.She would like to have parameters and does she need a sooner appointment with Dr.Croitoru.Advised Dr.Croitoru is out of office.I will send message to him for advice.

## 2022-04-29 NOTE — Telephone Encounter (Signed)
Spoke to Autumn with [facility] Dr.Hilty's advice given.Appointment scheduled with Dr.Croitoru 11/14 at 2:40 pm.

## 2022-05-10 ENCOUNTER — Ambulatory Visit: Payer: Medicare PPO | Attending: Cardiovascular Disease | Admitting: Cardiovascular Disease

## 2022-05-10 ENCOUNTER — Encounter: Payer: Self-pay | Admitting: Cardiovascular Disease

## 2022-05-10 ENCOUNTER — Other Ambulatory Visit: Payer: Self-pay | Admitting: Cardiovascular Disease

## 2022-05-10 VITALS — BP 132/90 | HR 101 | Ht 60.5 in | Wt 147.8 lb

## 2022-05-10 DIAGNOSIS — I10 Essential (primary) hypertension: Secondary | ICD-10-CM

## 2022-05-10 DIAGNOSIS — Z8673 Personal history of transient ischemic attack (TIA), and cerebral infarction without residual deficits: Secondary | ICD-10-CM | POA: Diagnosis not present

## 2022-05-10 DIAGNOSIS — R55 Syncope and collapse: Secondary | ICD-10-CM | POA: Diagnosis not present

## 2022-05-10 DIAGNOSIS — D6869 Other thrombophilia: Secondary | ICD-10-CM | POA: Diagnosis not present

## 2022-05-10 DIAGNOSIS — I480 Paroxysmal atrial fibrillation: Secondary | ICD-10-CM

## 2022-05-10 DIAGNOSIS — I495 Sick sinus syndrome: Secondary | ICD-10-CM | POA: Diagnosis not present

## 2022-05-10 DIAGNOSIS — E7800 Pure hypercholesterolemia, unspecified: Secondary | ICD-10-CM | POA: Diagnosis not present

## 2022-05-10 DIAGNOSIS — I4819 Other persistent atrial fibrillation: Secondary | ICD-10-CM

## 2022-05-10 NOTE — Patient Instructions (Addendum)
Medication Instructions:  Continue same medications *If you need a refill on your cardiac medications before your next appointment, please call your pharmacy*   Lab Work: None ordered   Testing/Procedures: 14 day Heart Monitor   Follow-Up: At [facility], you and your health needs are our priority.  As part of our continuing mission to provide you with exceptional heart care, we have created designated Provider Care Teams.  These Care Teams include your primary Cardiologist (physician) and Advanced Practice Providers (APPs -  Physician Assistants and Nurse Practitioners) who all work together to provide you with the care you need, when you need it.  We recommend signing up for the patient portal called "MyChart".  Sign up information is provided on this After Visit Summary.  MyChart is used to connect with patients for Virtual Visits (Telemedicine).  Patients are able to view lab/test results, encounter notes, upcoming appointments, etc.  Non-urgent messages can be sent to your provider as well.   To learn more about what you can do with MyChart, go to [email or web address].    Your next appointment:  Keep appointment already scheduled   Monday 07/04/22 at 10:00 am    The format for your next appointment: Office  Provider:  Dr.Croitoru   Important Information About [place]

## 2022-05-10 NOTE — Progress Notes (Signed)
Cardiology Office Note:    Date:  05/14/2022   ID:  Jessica Mullins, DOB [birth year], MRN [id number]  PCP:  Virgie Dad, MD  [facility] Cardiologist:  Sanda Klein, MD  [place] Electrophysiologist:  None   Referring MD: Virgie Dad, MD   No chief complaint on file.    History of Present Illness:    Jessica Mullins is a 88 y.o. female with a hx of paroxysmal atrial fibrillation without significant structural heart disease, more recently with persistent atrial fibrillation, remote history of vasovagal syncope, history of stroke 2022, cerebral meningioma.  She complains of some palpitations and dizziness and was seen by the nurse at [place] which set her up for another appointment because her rhythm is irregular.  Jessica Mullins reports that she had 1 episode in the middle of the night when she went to the bathroom and thinks that she almost passed out.  She has known persistent atrial fibrillation.  In fact her ventricular rate has been very well controlled, based on [facility].  Today when she came in the heart rate was a little fast at 101, when I rechecked it it was in the low 90s.  Her biggest complaint remains that that she is uncertain of her gait and is using a walker now.  She has complaints of fatigue.  Mostly she complains of weak legs, rather than overall loss of stamina.    She has not had falls.  She has not had any bleeding problems and remains appropriately anticoagulated with Eliquis.  On several occasions we discussed options for conservative management with continued rate control versus more aggressive antiarrhythmic therapy.  She remains undecided.  At her last appointment we discussed options for conservative management (continue anticoagulation, no need for rate control medications at this time) versus antiarrhythmic therapy with dofetilide (which requires hospitalization) or amiodarone (which has a much higher side effect profile and which  would guarantee that she needs a pacemaker sooner.  Also discussed the fact that it is very likely that a pacemaker will be necessary at some point in the future due to evidence of AV node conduction disease (normal ventricular rates during atrial fibrillation without medications).   In November 2022 she developed problems with visual changes eventually with severe diplopia.  MRI of the brain showed a small punctate acute left parietal infarction, not an area that would be expected to contribute to visual changes.  CT angiogram of the head and neck did not show any large vessel stenoses and there was mild for age atherosclerosis in the intracranial vessels without significant stenoses.  The echocardiogram showed unchanged findings with normal LV systolic function LA dilated left atrium in the absence of hemodynamically significant valvular abnormalities (significant mitral annular calcification with caseous change reported).  Her symptoms have resolved.  She was seen in the emergency room for problems with her balance in early May.  She was in sinus bradycardia at 51 bpm.  On [name] 20 she had dizziness and blurry vision with a heart rate reportedly of only 39 bpm.  She complains of fatigue and poor balance.  We stopped her diltiazem.  The event monitor showed persistent atrial fibrillation.  She was still taking flecainide and we set her up for cardioversion which was successfully performed on 01/11/2022.  She presents today in atrial fibrillation with controlled ventricular response again.  She remains arrhythmia unaware so she cannot tell us when the arrhythmia recurred.  She did not feel any improvement  following her cardioversion.  Her previous event monitor October 2021 did show a tendency to bradycardia with heart rates frequently in the 50s and borderline criteria for chronotropic incompetence.  Her average heart rate was 64.  The maximum achieved heart rate and sinus rhythm was 103 (75% of predicted for  age).    Past Medical History:  Diagnosis Date   Allergy    Ankle fracture, left    Atrial fibrillation ([place])    Per [place] New Patient Packet    Breast cancer ([place]) 1115/16   left    Bursitis of right shoulder    Cataract    Family history of cancer    Fracture of right wrist    GERD (gastroesophageal reflux disease)    Hyperlipidemia    Hypertension    Lumbar radiculitis    Per [place] New Patient Packet    Meningioma [facility])    Per [place] New Patient Packet    Skin cancer 2000   melanoma and basal cell   Stress fracture    Right Heel, Per [place] New Patient Packet    TIA (transient ischemic attack) 2022   Vaso vagal episode    during preparation for colonoscopy    Past Surgical History:  Procedure Laterality Date   ABDOMINAL HYSTERECTOMY  1988   TAH/BSO--FIBROIDS   APPENDECTOMY     BREAST LUMPECTOMY     B/L--[facility]   BREAST LUMPECTOMY WITH RADIOACTIVE SEED AND SENTINEL LYMPH NODE BIOPSY Left 06/11/2015   Procedure: LEFT BREAST LUMPECTOMY WITH RADIOACTIVE SEED AND LEFT SENTINEL LYMPH NODE MAPPING;  Surgeon: Erroll Luna, MD;  Location: [place];  Service: General;  Laterality: Left;   CARDIOVERSION N/A 01/11/2022   Procedure: CARDIOVERSION;  Surgeon: Buford Dresser, MD;  Location: [place];  Service: Cardiovascular;  Laterality: N/A;   CATARACT EXTRACTION  2015   COLONOSCOPY  2010   DG  BONE DENSITY ([place] HX)     EYE SURGERY Bilateral    cataracts   fiberadenoma Bilateral 1978, 1980   GANGLION CYST EXCISION     L  hand   Lipiflow procedure      Current Medications: Current Meds  Medication Sig   acetaminophen (TYLENOL) 325 MG tablet Take 325 mg by mouth every 6 (six) hours as needed for moderate pain.   AMBULATORY NON FORMULARY MEDICATION Walker use daily as directed.  Disp 1 Leg pain M79.604   Biotin 5 MG CAPS Take 5 mg by mouth daily.    Calcium Carbonate-Vitamin D 600-400 MG-UNIT tablet Take 1 tablet by mouth daily.   Cholecalciferol (VITAMIN D3) 1000 UNITS  CAPS Take 1,000 Units by mouth daily.   cycloSPORINE (RESTASIS) 0.05 % ophthalmic emulsion Place 1 drop into both eyes 2 (two) times daily.   ELIQUIS 5 MG TABS tablet TAKE ONE TABLET BY MOUTH TWICE DAILY   Multiple Vitamins-Minerals (PRESERVISION AREDS) TABS Take 2 tablets by mouth daily.   Omega-3 Fatty Acids (FISH OIL PO) Take 1 Capful by mouth daily.   polyethylene glycol (MIRALAX / GLYCOLAX) 17 g packet Take 17 g by mouth daily as needed.   pravastatin (PRAVACHOL) 40 MG tablet TAKE ONE TABLET BY MOUTH ONCE DAILY   Psyllium (METAMUCIL PO) Take 1 Dose by mouth daily. 1 teaspoon   Turmeric 500 MG CAPS Take 1 capsule by mouth daily.   vitamin B-12 (CYANOCOBALAMIN) 1000 MCG tablet Take 1 tablet (1,000 mcg total) by mouth daily.     Allergies:   Sulfa antibiotics, Tape, Glucosamine forte [nutritional supplements], and  Latex   Social History   Socioeconomic History   Marital status: Widowed    Spouse name: Not on file   Number of children: 2   Years of education: Not on file   Highest education level: Master's degree (e.g., MA, MS, MEng, MEd, [name], [name])  Occupational History   Occupation: Product manager: RETIRED    Comment: retired  Tobacco Use   Smoking status: Never   Smokeless tobacco: Never  Vaping Use   Vaping Use: Never used  Substance and Sexual Activity   Alcohol use: Yes    Comment: rare   Drug use: No   Sexual activity: Never  Other Topics Concern   Not on file  Social History Narrative   Patient is widowed.  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    One son one daughter   1 caffeinated beverage daily   She is right-handed.    She works out everyday to a televised [employer] 30 minute program.   No tobacco or alcohol         Per [place] New Patient Packet Abstracted on 01/03/2020      Diet: Left blank       Caffeine: Yes      Married, if yes what year: Widowed, married in [place] you live in a house, apartment, assisted living, condo, trailer,  ect: Apartment      Is it one or more stories: One stories, one person       Pets: 1 Neurosurgeon      Current/Past profession: Pharmacist, hospital      Highest level or education completed: MA of Education       Exercise:   Yes             Type and how often: Cardio, chair aerobics, 3-4 times weekly          Living Will: Yes   DNR: Yes   POA/HPOA: Yes      Functional Status:   Do you have difficulty bathing or dressing yourself? Left blank   Do you have difficulty preparing food or eating? Left blank   Do you have difficulty managing your medications? Left blank   Do you have difficulty managing your finances? Left blank   Do you have difficulty affording your medications? Left blank   Social Determinants of Health   Financial Resource Strain: Low Risk  (11/14/2019)   Overall Financial Resource Strain (CARDIA)    Difficulty of Paying Living Expenses: Not hard at all  Food Insecurity: No Food Insecurity (11/14/2019)   Hunger Vital Sign    Worried About Running Out of Food in the Last Year: Never true    Ran Out of Food in the Last Year: Never true  Transportation Needs: No Transportation Needs (11/14/2019)   PRAPARE - Hydrologist (Medical): No    Lack of Transportation (Non-Medical): No  Physical Activity: Not on file  Stress: Not on file  Social Connections: Not on file     Family History: The patient's family history includes Cancer in her mother; Coronary artery disease in her brother; Diabetes in her brother and maternal aunt; Heart disease in her brother; Heart disease (age of onset: 6) in her father; High Cholesterol in her son; Hyperlipidemia in her brother; Hypertension in her brother and son; Lung cancer in an other family member; Lung cancer (age of onset: 58) in her sister; Thrombosis  in an other family member.  ROS:   Please see the history of present illness.     All other systems reviewed and are negative.  EKGs/Labs/Other Studies Reviewed:     The following studies were reviewed today: Event monitor 09/02-09/15  ECHO 05/11/2021   1. A small pericardial effusion is present. The pericardial effusion is  anterior to the right ventricle. There is no RV or RA collapse suggestive  of increased pericardial pressures.   2. The inferior vena cava is normal in size with greater than 50%  respiratory variability, suggesting right atrial pressure of 3 mmHg.   3. Caseous mitral annular calcifification noted. The mitral valve is  abnormal. No evidence of mitral valve regurgitation. No evidence of mitral  stenosis.   4. Left ventricular ejection fraction, by estimation, is 55 to 60%. The  left ventricle has normal function. The left ventricle has no regional  wall motion abnormalities. There is mild concentric left ventricular  hypertrophy. Left ventricular diastolic  parameters are consistent with Grade II diastolic dysfunction  (pseudonormalization).   5. Right ventricular systolic function is normal. The right ventricular  size is normal. There is normal pulmonary artery systolic pressure.   6. Left atrial size was mildly dilated.   7. The aortic valve was not well visualized. Aortic valve regurgitation  is not visualized. No aortic stenosis is present.   Comparison(s): Prior images unable to be directly viewed, comparison made  by report only. Similar from prior report.   EKG:  EKG is ordered today.  It shows atrial fibrillation with borderline ventricular rate control at 101 bpm, QS pattern in leads V1-V2, QTc 464 ms (hard to measure with accuracy due to the arrhythmia).  Recent Labs: 04/21/2022: ALT 31; BUN 16; Creatinine 0.8; Hemoglobin 15.4; Platelets 193; Potassium 4.1; Sodium 144  Recent Lipid Panel    Component Value Date/Time   CHOL 122 04/21/2022 0000   TRIG 66 04/21/2022 0000   HDL 58 04/21/2022 0000   CHOLHDL 2.5 05/11/2021 0128   VLDL 17 05/11/2021 0128   LDLCALC 50 04/21/2022 0000   LDLDIRECT 153.4 11/16/2009  0000    Physical Exam:    VS:  BP (!) 132/90 (BP Location: Left Arm, Patient Position: Sitting, Cuff Size: Normal)   Pulse (!) 101   Ht 5' 0.5" (1.537 m)   Wt 67 kg   SpO2 98%   BMI 28.39 kg/m     Wt Readings from Last 3 Encounters:  05/10/22 67 kg  04/22/22 66.2 kg  04/19/22 66.5 kg      General: Alert, oriented x3, no distress, overweight Head: no evidence of trauma, PERRL, EOMI, no exophtalmos or lid lag, no myxedema, no xanthelasma; normal ears, nose and oropharynx Neck: normal jugular venous pulsations and no hepatojugular reflux; brisk carotid pulses without delay and no carotid bruits Chest: clear to auscultation, no signs of consolidation by percussion or palpation, normal fremitus, symmetrical and full respiratory excursions Cardiovascular: normal position and quality of the apical impulse, irregular rhythm, normal first and second heart sounds, no murmurs, rubs or gallops Abdomen: no tenderness or distention, no masses by palpation, no abnormal pulsatility or arterial bruits, normal bowel sounds, no hepatosplenomegaly Extremities: no clubbing, cyanosis or edema; 2+ radial, ulnar and brachial pulses bilaterally; 2+ right femoral, posterior tibial and dorsalis pedis pulses; 2+ left femoral, posterior tibial and dorsalis pedis pulses; no subclavian or femoral bruits Neurological: grossly nonfocal Psych: Normal mood and affect   ASSESSMENT:    1. Persistent  atrial fibrillation ([place])   2. Near syncope   3. SSS (sick sinus syndrome) ([place])   4. Acquired thrombophilia ([place])   5. Essential hypertension   6. History of ischemic stroke   7. Hypercholesterolemia         PLAN:    In order of problems listed above:  AFib: She is only mildly symptomatic.  Most of her complaints appear related to leg weakness.  She failed flecainide antiarrhythmic therapy with early recurrence of arrhythmia after cardioversion.   Even last year she already had evidence of chronotropic  incompetence and we had broached the subject of possible pacemaker.  Has spontaneous ventricular rate control due to AV conduction disease.  She remains uncertain about whether she should pursue aggressive rhythm maintenance therapy.  QTc on the current EKG suggest that she may not tolerate dofetilide.  Amiodarone is virtually certain to push her to pacemaker therapy.  I think I would recommend continuing the conservative approach. Near syncope:We will check an event monitor to make sure she is not having periods of extreme tachycardia or bradycardia that could cause symptoms.    SSS/AV conduction abnormalities: We had to stop all AV node blocking agents due to bradycardia.  Currently well rate controlled but anticipate that as time passes she will again develop slow ventricular response even if we do not put her on additional antiarrhythmics. Anticoagulation: No bleeding problems HTN: Adequately trolled.  She has a history of orthostatic hypotension. History of CVA: Incidentally seen on CT and not correlating with symptoms and not in a pattern typical for embolic events  HLP: All lipid parameters in desirable range on the current medications.  So far holding her statin has not led to any improvement in her proximal leg muscle strength.  Reevaluate at her appointment in January.  Medication Adjustments/Labs and Tests Ordered: Current medicines are reviewed at length with the patient today.  Concerns regarding medicines are outlined above.  Orders Placed This Encounter  Procedures   EKG 12-Lead    No orders of the defined types were placed in this encounter.    Patient Instructions  Medication Instructions:  Continue same medications *If you need a refill on your cardiac medications before your next appointment, please call your pharmacy*   Lab Work: None ordered   Testing/Procedures: 14 day Heart Monitor   Follow-Up: At [facility], you and your health needs are our  priority.  As part of our continuing mission to provide you with exceptional heart care, we have created designated Provider Care Teams.  These Care Teams include your primary Cardiologist (physician) and Advanced Practice Providers (APPs -  Physician Assistants and Nurse Practitioners) who all work together to provide you with the care you need, when you need it.  We recommend signing up for the patient portal called "MyChart".  Sign up information is provided on this After Visit Summary.  MyChart is used to connect with patients for Virtual Visits (Telemedicine).  Patients are able to view lab/test results, encounter notes, upcoming appointments, etc.  Non-urgent messages can be sent to your provider as well.   To learn more about what you can do with MyChart, go to [email or web address].    Your next appointment:  Keep appointment already scheduled   Monday 07/04/22 at 10:00 am    The format for your next appointment: Office  Provider:  Dr.Lynne Takemoto   Important Information About Sugar         Signed, Sanda Klein, MD  05/14/2022 6:10 PM  [facility]

## 2022-05-12 ENCOUNTER — Telehealth: Payer: Self-pay | Admitting: Cardiovascular Disease

## 2022-05-12 ENCOUNTER — Ambulatory Visit: Payer: Medicare PPO | Attending: Cardiovascular Disease

## 2022-05-12 DIAGNOSIS — I480 Paroxysmal atrial fibrillation: Secondary | ICD-10-CM

## 2022-05-12 DIAGNOSIS — R55 Syncope and collapse: Secondary | ICD-10-CM | POA: Diagnosis not present

## 2022-05-12 NOTE — Telephone Encounter (Signed)
Received a call from [facility] with [employer] calling to report patient just applied monitor this morning.She had a episode of Afib at 10:56 am rate 73 to 103 lasting appox 90 sec.She was called and she was aware.I will send message to Dr.Croitoru.

## 2022-05-12 NOTE — Telephone Encounter (Signed)
Abnormal EKG results. Call transferred

## 2022-05-12 NOTE — Telephone Encounter (Signed)
She is in persistent atrial fibrillation for months. That sounds like appropriate rate control.

## 2022-05-12 NOTE — Progress Notes (Unsigned)
ZIO AT serial # [id number] from office inventory applied to patient.

## 2022-05-12 NOTE — Telephone Encounter (Signed)
Spoke to patient Dr.Croitoru advised heart rate controlled with last episode of afib.Advised to wear monitor as prescribed.

## 2022-05-13 DIAGNOSIS — R55 Syncope and collapse: Secondary | ICD-10-CM | POA: Diagnosis not present

## 2022-05-13 DIAGNOSIS — I480 Paroxysmal atrial fibrillation: Secondary | ICD-10-CM | POA: Diagnosis not present

## 2022-05-17 ENCOUNTER — Non-Acute Institutional Stay: Payer: Medicare PPO | Admitting: Internal Medicine

## 2022-05-17 ENCOUNTER — Encounter: Payer: Self-pay | Admitting: Internal Medicine

## 2022-05-17 VITALS — BP 134/94 | HR 99 | Temp 97.8°F | Resp 17 | Ht 60.5 in | Wt 147.0 lb

## 2022-05-17 DIAGNOSIS — R269 Unspecified abnormalities of gait and mobility: Secondary | ICD-10-CM

## 2022-05-17 DIAGNOSIS — I480 Paroxysmal atrial fibrillation: Secondary | ICD-10-CM

## 2022-05-17 DIAGNOSIS — R42 Dizziness and giddiness: Secondary | ICD-10-CM

## 2022-05-17 NOTE — Progress Notes (Signed)
Location: [facility] of Service:  Clinic (12)  Provider:   Code Status: DNR Goals of Care:     05/17/2022    2:34 PM  Advanced Directives  Does Patient Have a Medical Advance Directive? Yes  Type of Paramedic of [place];Living will;Out of facility DNR (pink MOST or yellow form)  Does patient want to make changes to medical advance directive? No - Patient declined  Copy of [place] in Chart? Yes - validated most recent copy scanned in chart (See row information)     Chief Complaint  Patient presents with   Medical Management of Chronic Issues    4 week follow up. Also patient would like to discuss some back pain    HPI: Patient is a 88 y.o. female seen today for an acute visit for follow of her Dizziness   [place] lives [place] in [place]   Patient has h/o Meningoma Diagnosed with MRI in 4/21 Has seen Neurosurgery H/o Low Back Pain with Radiculopathy follows with Neurology H/o Breast Cancer Invasive Ductal Carcinoma diagnosed in 2016 Osteopenia Last DEXA was -1.6 in 2020 H/o PAF  Follows with Cardiology On Eliquis Also h/o Bradycardia Hypertension with Tendency for Orthostatic Hypotension  Dizziness It is chronic issue Has seen Dr Tomi Likens and Cardiology She says it is getting worse She describes her dizziness is more like feeling unstable.  And fear of fall.  She denies any vertigo.  No change with position.  Unable to walk without her walker Getting another opinion for her dizziness PAF Did not make and changes in her meds per Dr Sallyanne Kuster Has talked about Pacemaker she still not sure No Antiarrythmic drugs due to AV nodal disease Does have Event Monitor Chronic Back pain Takes tylenol Does not want to take anything aggressive due to Dizziness and fear of falling  Past Medical History:  Diagnosis Date   Allergy    Ankle fracture, left    Atrial fibrillation ([place])    Per [facility] New Patient Packet     Breast cancer ([place]) 1115/16   left    Bursitis of right shoulder    Cataract    Family history of cancer    Fracture of right wrist    GERD (gastroesophageal reflux disease)    Hyperlipidemia    Hypertension    Lumbar radiculitis    Per [facility] New Patient Packet    Meningioma [facility])    Per [facility] New Patient Packet    Skin cancer 2000   melanoma and basal cell   Stress fracture    Right Heel, Per [facility] New Patient Packet    TIA (transient ischemic attack) 2022   Vaso vagal episode    during preparation for colonoscopy    Past Surgical History:  Procedure Laterality Date   ABDOMINAL HYSTERECTOMY  1988   TAH/BSO--FIBROIDS   APPENDECTOMY     BREAST LUMPECTOMY     B/L--[facility]   BREAST LUMPECTOMY WITH RADIOACTIVE SEED AND SENTINEL LYMPH NODE BIOPSY Left 06/11/2015   Procedure: LEFT BREAST LUMPECTOMY WITH RADIOACTIVE SEED AND LEFT SENTINEL LYMPH NODE MAPPING;  Surgeon: Erroll Luna, MD;  Location: [facility];  Service: General;  Laterality: Left;   CARDIOVERSION N/A 01/11/2022   Procedure: CARDIOVERSION;  Surgeon: Buford Dresser, MD;  Location: [facility];  Service: Cardiovascular;  Laterality: N/A;   CATARACT EXTRACTION  2015   COLONOSCOPY  2010   DG  BONE DENSITY ([place] HX)     EYE  SURGERY Bilateral    cataracts   fiberadenoma Bilateral 1978, 1980   GANGLION CYST EXCISION     L  hand   Lipiflow procedure      Allergies  Allergen Reactions   Sulfa Antibiotics Other (See Comments)    As a child, rigid as a stick and not responsive   Tape Other (See Comments)    Blisters, Please use "paper" tape only for short periods of time   Glucosamine Forte [Nutritional Supplements] Rash   Latex Rash    Outpatient Encounter Medications as of 05/17/2022  Medication Sig   acetaminophen (TYLENOL) 325 MG tablet Take 325 mg by mouth every 6 (six) hours as needed for moderate pain.   AMBULATORY NON FORMULARY MEDICATION Walker use daily as directed.  Disp 1 Leg pain M79.604   Biotin 5 MG  CAPS Take 5 mg by mouth daily.    Calcium Carbonate-Vitamin D 600-400 MG-UNIT tablet Take 1 tablet by mouth daily.   Cholecalciferol (VITAMIN D3) 1000 UNITS CAPS Take 1,000 Units by mouth daily.   cycloSPORINE (RESTASIS) 0.05 % ophthalmic emulsion Place 1 drop into both eyes 2 (two) times daily.   ELIQUIS 5 MG TABS tablet TAKE ONE TABLET BY MOUTH TWICE DAILY   Multiple Vitamins-Minerals (PRESERVISION AREDS) TABS Take 2 tablets by mouth daily.   Omega-3 Fatty Acids (FISH OIL PO) Take 1 Capful by mouth daily.   polyethylene glycol (MIRALAX / GLYCOLAX) 17 g packet Take 17 g by mouth daily as needed.   pravastatin (PRAVACHOL) 40 MG tablet TAKE ONE TABLET BY MOUTH ONCE DAILY   Psyllium (METAMUCIL PO) Take 1 Dose by mouth daily. 1 teaspoon   Turmeric 500 MG CAPS Take 1 capsule by mouth daily.   vitamin B-12 (CYANOCOBALAMIN) 1000 MCG tablet Take 1 tablet (1,000 mcg total) by mouth daily.   No facility-administered encounter medications on file as of 05/17/2022.    Review of Systems:  Review of Systems  Constitutional:  Positive for activity change. Negative for appetite change.  HENT: Negative.    Respiratory:  Negative for cough and shortness of breath.   Cardiovascular:  Negative for leg swelling.  Gastrointestinal:  Negative for constipation.  Genitourinary: Negative.   Musculoskeletal:  Positive for back pain and gait problem. Negative for arthralgias and myalgias.  Skin: Negative.   Neurological:  Positive for dizziness and weakness.  Psychiatric/Behavioral:  Negative for confusion, dysphoric mood and sleep disturbance.     Health Maintenance  Topic Date Due   Medicare Annual Wellness (AWV)  09/25/2013   COVID-19 Vaccine (6 - 2023-24 season) 06/22/2022   MAMMOGRAM  07/11/2022   Pneumonia Vaccine 24+ Years old  Completed   INFLUENZA VACCINE  Completed   DEXA SCAN  Completed   Zoster Vaccines- Shingrix  Completed   HPV VACCINES  Aged Out    Physical Exam: Vitals:   05/17/22  1430  BP: (!) 134/94  Pulse: 99  Resp: 17  Temp: 97.8 F (36.6 C)  TempSrc: Temporal  SpO2: 98%  Weight: 147 lb (66.7 kg)  Height: 5' 0.5" (1.537 m)   Body mass index is 28.24 kg/m. Physical Exam Vitals reviewed.  Constitutional:      Appearance: Normal appearance.  HENT:     Head: Normocephalic.     Nose: Nose normal.     Mouth/Throat:     Mouth: Mucous membranes are moist.     Pharynx: Oropharynx is clear.  Eyes:     Pupils: Pupils are equal, round, and reactive  to light.  Cardiovascular:     Rate and Rhythm: Normal rate. Rhythm irregular.     Pulses: Normal pulses.     Heart sounds: Normal heart sounds. No murmur heard. Pulmonary:     Effort: Pulmonary effort is normal.     Breath sounds: Normal breath sounds.  Abdominal:     General: Abdomen is flat. Bowel sounds are normal.     Palpations: Abdomen is soft.  Musculoskeletal:        General: No swelling.     Cervical back: Neck supple.  Skin:    General: Skin is warm.  Neurological:     General: No focal deficit present.     Mental Status: She is alert and oriented to person, place, and time.  Psychiatric:        Mood and Affect: Mood normal.        Thought Content: Thought content normal.     Labs reviewed: Basic Metabolic Panel: Recent Labs    11/04/21 1144 01/06/22 1026 04/21/22 0000  NA 141 142 144  K 4.6 4.7 4.1  CL 105 103 106  CO2 30 28 27*  GLUCOSE 96 87  --   BUN '17 16 16  '$ CREATININE 0.71 0.77 0.8  CALCIUM 9.9 10.1 9.8   Liver Function Tests: Recent Labs    11/04/21 1144 02/10/22 0000 04/21/22 0000  AST 18 37* 25  ALT 21 49* 31  ALKPHOS 49 65 68  BILITOT 0.5  --   --   PROT 6.2*  --   --   ALBUMIN 4.4 4.3 4.0   No results for input(s): "LIPASE", "AMYLASE" in the last 8760 hours. No results for input(s): "AMMONIA" in the last 8760 hours. CBC: Recent Labs    11/04/21 1144 01/06/22 1026 04/21/22 0000  WBC 6.8 7.6 7.2  NEUTROABS 4.5  --   --   HGB 14.6 14.8 15.4  HCT  43.8 44.5 46  MCV 91.8 92  --   PLT 205 226 193   Lipid Panel: Recent Labs    02/10/22 0000 04/21/22 0000  CHOL 142 122  HDL 62 58  LDLCALC 64 50  TRIG 81 66   Lab Results  Component Value Date   HGBA1C 4.8 05/11/2021    Procedures since last visit: No results found.  Assessment/Plan 1. Dizziness Per Neurology combination of her meningoma and deconditioning I have made Referral to Therapy She is also going ot see neurology again for another opinion Labs Look good Reviewed 2. Gait abnormality Therapy referal  3. Paroxysmal atrial fibrillation (HCC) Following closely with Cardiology Still not sure if she wants Pacemaker with Antiarrhythmic drugs 4 Chronic Back pain Tylenol and Therapy Does not want anything stronger  Other issues History of CVA (cerebrovascular accident) On Eliquis Statin Aspirin not added per Neurology   History of breast cancer In Remission Follow with Oncology PRN Annually Mammogram  Essential hypertension Off all meds Pure hypercholesterolemia Continue Statin LDL 70 in 11/22  Lung nodule No Further Follow up needed   Meningioma ([place]) stable   Constipation Doing well with Miralax PRN     Labs/tests ordered:  * No order type specified * Next appt:  08/01/2022

## 2022-06-06 ENCOUNTER — Ambulatory Visit: Payer: Medicare PPO | Admitting: Neurology

## 2022-06-08 ENCOUNTER — Other Ambulatory Visit: Payer: Self-pay

## 2022-06-08 ENCOUNTER — Encounter: Payer: Medicare PPO | Admitting: Internal Medicine

## 2022-06-08 MED ORDER — METOPROLOL SUCCINATE ER 25 MG PO TB24: ORAL_TABLET | ORAL | 3 refills | Status: DC

## 2022-06-09 ENCOUNTER — Encounter: Payer: Self-pay | Admitting: Physician Assistant

## 2022-06-09 ENCOUNTER — Ambulatory Visit: Payer: Medicare PPO | Attending: Physician Assistant | Admitting: Physician Assistant

## 2022-06-09 VITALS — BP 168/96 | HR 92 | Ht 60.0 in | Wt 145.4 lb

## 2022-06-09 DIAGNOSIS — R001 Bradycardia, unspecified: Secondary | ICD-10-CM | POA: Diagnosis not present

## 2022-06-09 DIAGNOSIS — I10 Essential (primary) hypertension: Secondary | ICD-10-CM | POA: Diagnosis not present

## 2022-06-09 DIAGNOSIS — I4819 Other persistent atrial fibrillation: Secondary | ICD-10-CM | POA: Diagnosis not present

## 2022-06-09 DIAGNOSIS — E785 Hyperlipidemia, unspecified: Secondary | ICD-10-CM | POA: Diagnosis not present

## 2022-06-09 DIAGNOSIS — Z8673 Personal history of transient ischemic attack (TIA), and cerebral infarction without residual deficits: Secondary | ICD-10-CM | POA: Diagnosis not present

## 2022-06-09 DIAGNOSIS — R413 Other amnesia: Secondary | ICD-10-CM

## 2022-06-09 NOTE — Progress Notes (Signed)
Cardiology Office Note:    Date:  06/11/2022   ID:  Jessica Mullins, DOB 11-27-1930, MRN [id number]  PCP:  Virgie Dad, MD   [facility] Cardiologist:  Sanda Klein, MD     Referring MD: Virgie Dad, MD   Chief Complaint  Patient presents with   Follow-up    Seen for Dr. Sallyanne Kuster    History of Present Illness:    Jessica Mullins is a 88 y.o. female with a hx of persistent atrial fibrillation, remote history of vasovagal syncope, CVA 2022, cerebral meningioma, hypertension, and hyperlipidemia. Previous heart monitor in October 2021 showed tendency to bradycardia with heart rate frequently in the 50s, borderline criteria for chronotropic incompetence.  The maximum achieved heart rate was 130s sinus rhythm, which is 75% of predicted for age.  In November 2022, she developed problem with visual changes including diplopia.  MRI of the brain showed small punctate acute left parietal infarction, not an area that would expected to contribute to visual changes.  CTA of the head and neck did not show large vessel stenosis.  Echocardiogram was unchanged with normal EF and dilated left atrium.  Her symptom eventually resolved.    Patient was seen in the ED in May 2023.  She was seen sinus bradycardia with heart rate of 51 bpm.  In [place], she had dizziness and blurred vision with heart rate reportedly down to 39 bpm.  She complained of fatigue and poor balance.  Her diltiazem was stopped.  Event monitor showed persistent atrial fibrillation.  She was still taking flecainide at the time and she was set up for outpatient cardioversion which was successful on 01/11/2022.  Unfortunately she had very early recurrence of A-fib on flecainide, flecainide was stopped due to failure of therapy.  She has no cardiac awareness of atrial fibrillation, therefore did not feel significant improvement following her cardioversion.  During the most recent visit, it was discussed with the  patient possibility of leaving her in A-fib with self-controlled heart rate and not on AV nodal blocking agent versus antiarrhythmic therapy such as Tikosyn and amiodarone.  Although it was felt with amiodarone, she is not more likely to progress to pacemaker therapy.  She was last seen by Dr. Sallyanne Kuster in November 2023, heart monitor was recommended to make sure she does not have episodes of extreme tachycardia or bradycardia.  Heart monitor showed she was staying in atrial fibrillation, her heart rate does go up to 110s to 120s with exercise, therefore she was started on 25 mg daily of metoprolol succinate.  Patient presents today in follow-up.  Recent note by internal medicine service suggested dizziness, daughter's primary concern however is correlation between A-fib and memory loss she is having significant decline in her short-term memory in the recent month.  Not confident that her decline is truly related to A-fib.  She has not started on the metoprolol succinate as recommended by Dr. Sallyanne Kuster, she likely will take her medication from PCP pharmacy today or tomorrow.  I asked her to start on the medication. Her dizziness seems to be more orthostatic in nature, however her blood pressure is high to the office today.  In manual recheck by myself, her blood pressure was 150/100.  Maybe some of her primary issue is related to uncontrolled elevated blood pressure.  We discussed again regarding rate control strategy versus antiarrhythmic medication such as amiodarone versus Tikosyn.  She has no cardiac awareness of A-fib.  After her recent  cardioversion, her fatigue was unchanged.  Therefore I do not think her baseline fatigue is related to A-fib, it would be reasonable to continue rate control therapy.  Past Medical History:  Diagnosis Date   Allergy    Ankle fracture, left    Atrial fibrillation ([place])    Per [facility] New Patient Packet    Breast cancer ([place]) 1115/16   left    Bursitis of right shoulder     Cataract    Family history of cancer    Fracture of right wrist    GERD (gastroesophageal reflux disease)    Hyperlipidemia    Hypertension    Lumbar radiculitis    Per [facility] New Patient Packet    Meningioma [facility])    Per [facility] New Patient Packet    Skin cancer 2000   melanoma and basal cell   Stress fracture    Right Heel, Per [facility] New Patient Packet    TIA (transient ischemic attack) 2022   Vaso vagal episode    during preparation for colonoscopy    Past Surgical History:  Procedure Laterality Date   ABDOMINAL HYSTERECTOMY  1988   TAH/BSO--FIBROIDS   APPENDECTOMY     BREAST LUMPECTOMY     B/L--FCS   BREAST LUMPECTOMY WITH RADIOACTIVE SEED AND SENTINEL LYMPH NODE BIOPSY Left 06/11/2015   Procedure: LEFT BREAST LUMPECTOMY WITH RADIOACTIVE SEED AND LEFT SENTINEL LYMPH NODE MAPPING;  Surgeon: Erroll Luna, MD;  Location: [facility];  Service: General;  Laterality: Left;   CARDIOVERSION N/A 01/11/2022   Procedure: CARDIOVERSION;  Surgeon: Buford Dresser, MD;  Location: [facility];  Service: Cardiovascular;  Laterality: N/A;   CATARACT EXTRACTION  2015   COLONOSCOPY  2010   DG  BONE DENSITY ([place] HX)     EYE SURGERY Bilateral    cataracts   fiberadenoma Bilateral 1978, 1980   GANGLION CYST EXCISION     L  hand   Lipiflow procedure      Current Medications: Current Meds  Medication Sig   acetaminophen (TYLENOL) 325 MG tablet Take 325 mg by mouth every 6 (six) hours as needed for moderate pain.   AMBULATORY NON FORMULARY MEDICATION Walker use daily as directed.  Disp 1 Leg pain M79.604   Biotin 5 MG CAPS Take 5 mg by mouth daily.    Calcium Carbonate-Vitamin D 600-400 MG-UNIT tablet Take 1 tablet by mouth daily.   Cholecalciferol (VITAMIN D3) 1000 UNITS CAPS Take 1,000 Units by mouth daily.   cycloSPORINE (RESTASIS) 0.05 % ophthalmic emulsion Place 1 drop into both eyes 2 (two) times daily.   ELIQUIS 5 MG TABS tablet TAKE ONE TABLET BY MOUTH TWICE DAILY   metoprolol  succinate (TOPROL XL) 25 MG 24 hr tablet Take 25 mg every morning   Multiple Vitamins-Minerals (PRESERVISION AREDS) TABS Take 2 tablets by mouth daily.   Omega-3 Fatty Acids (FISH OIL PO) Take 1 Capful by mouth daily.   polyethylene glycol (MIRALAX / GLYCOLAX) 17 g packet Take 17 g by mouth daily as needed.   pravastatin (PRAVACHOL) 40 MG tablet TAKE ONE TABLET BY MOUTH ONCE DAILY   Psyllium (METAMUCIL PO) Take 1 Dose by mouth daily. 1 teaspoon   Turmeric 500 MG CAPS Take 1 capsule by mouth daily.   vitamin B-12 (CYANOCOBALAMIN) 1000 MCG tablet Take 1 tablet (1,000 mcg total) by mouth daily.     Allergies:   Sulfa antibiotics, Tape, Glucosamine forte [nutritional supplements], and Latex   Social History   Socioeconomic History  Marital status: Widowed    Spouse name: Not on file   Number of children: 2   Years of education: Not on file   Highest education level: Master's degree (e.g., MA, MS, [employer], MEd, [name], [name])  Occupational History   Occupation: Product manager: RETIRED    Comment: retired  Tobacco Use   Smoking status: Never   Smokeless tobacco: Never  Vaping Use   Vaping Use: Never used  Substance and Sexual Activity   Alcohol use: Yes    Comment: rare   Drug use: No   Sexual activity: Never  Other Topics Concern   Not on file  Social History Narrative   Patient is widowed.  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    One son one daughter   1 caffeinated beverage daily   She is right-handed.    She works out everyday to a televised [employer] 30 minute program.   No tobacco or alcohol         Per [facility] New Patient Packet Abstracted on 01/03/2020      Diet: Left blank       Caffeine: Yes      Married, if yes what year: Widowed, married in [place] you live in a house, apartment, assisted living, condo, trailer, ect: Apartment      Is it one or more stories: One stories, one person       Pets: 1 Neurosurgeon      Current/Past profession: Pharmacist, hospital       Highest level or education completed: MA of Education       Exercise:   Yes             Type and how often: Cardio, chair aerobics, 3-4 times weekly          Living Will: Yes   DNR: Yes   POA/HPOA: Yes      Functional Status:   Do you have difficulty bathing or dressing yourself? Left blank   Do you have difficulty preparing food or eating? Left blank   Do you have difficulty managing your medications? Left blank   Do you have difficulty managing your finances? Left blank   Do you have difficulty affording your medications? Left blank   Social Determinants of Health   Financial Resource Strain: Low Risk  (11/14/2019)   Overall Financial Resource Strain (CARDIA)    Difficulty of Paying Living Expenses: Not hard at all  Food Insecurity: No Food Insecurity (11/14/2019)   Hunger Vital Sign    Worried About Running Out of Food in the Last Year: Never true    Ran Out of Food in the Last Year: Never true  Transportation Needs: No Transportation Needs (11/14/2019)   PRAPARE - Hydrologist (Medical): No    Lack of Transportation (Non-Medical): No  Physical Activity: Not on file  Stress: Not on file  Social Connections: Not on file     Family History: The patient's family history includes Cancer in her mother; Coronary artery disease in her brother; Diabetes in her brother and maternal aunt; Heart disease in her brother; Heart disease (age of onset: 78) in her father; High Cholesterol in her son; Hyperlipidemia in her brother; Hypertension in her brother and son; Lung cancer in an other family member; Lung cancer (age of onset: 16) in her sister; Thrombosis in an other family member.  ROS:   Please see  the history of present illness.     All other systems reviewed and are negative.  EKGs/Labs/Other Studies Reviewed:    The following studies were reviewed today:  Echo 05/11/2021 1. A small pericardial effusion is present. The pericardial effusion is   anterior to the right ventricle. There is no RV or RA collapse suggestive  of increased pericardial pressures.   2. The inferior vena cava is normal in size with greater than 50%  respiratory variability, suggesting right atrial pressure of 3 mmHg.   3. Caseous mitral annular calcifification noted. The mitral valve is  abnormal. No evidence of mitral valve regurgitation. No evidence of mitral  stenosis.   4. Left ventricular ejection fraction, by estimation, is 55 to 60%. The  left ventricle has normal function. The left ventricle has no regional  wall motion abnormalities. There is mild concentric left ventricular  hypertrophy. Left ventricular diastolic  parameters are consistent with Grade II diastolic dysfunction  (pseudonormalization).   5. Right ventricular systolic function is normal. The right ventricular  size is normal. There is normal pulmonary artery systolic pressure.   6. Left atrial size was mildly dilated.   7. The aortic valve was not well visualized. Aortic valve regurgitation  is not visualized. No aortic stenosis is present.   Comparison(s): Prior images unable to be directly viewed, comparison made  by report only. Similar from prior report.   EKG:  EKG is ordered today.  The ekg ordered today demonstrates atrial fibrillation, rate controlled.  Significant motion artifact.  Recent Labs: 04/21/2022: ALT 31; BUN 16; Creatinine 0.8; Hemoglobin 15.4; Platelets 193; Potassium 4.1; Sodium 144  Recent Lipid Panel    Component Value Date/Time   CHOL 122 04/21/2022 0000   TRIG 66 04/21/2022 0000   HDL 58 04/21/2022 0000   CHOLHDL 2.5 05/11/2021 0128   VLDL 17 05/11/2021 0128   LDLCALC 50 04/21/2022 0000   LDLDIRECT 153.4 11/16/2009 0000     Risk Assessment/Calculations:    CHA2DS2-VASc Score = 6   This indicates a 9.7% annual risk of stroke. The patient's score is based upon: CHF History: 0 HTN History: 1 Diabetes History: 0 Stroke History: 2 Vascular  Disease History: 0 Age Score: 2 Gender Score: 1          Physical Exam:    VS:  BP (!) 168/96   Pulse 92   Ht 5' (1.524 m)   Wt 145 lb 6.4 oz (66 kg)   SpO2 97%   BMI 28.40 kg/m        Wt Readings from Last 3 Encounters:  06/09/22 145 lb 6.4 oz (66 kg)  05/17/22 147 lb (66.7 kg)  05/10/22 147 lb 12.8 oz (67 kg)     GEN:  Well nourished, well developed in no acute distress HEENT: Normal NECK: No JVD; No carotid bruits LYMPHATICS: No lymphadenopathy CARDIAC: Irregularly irregular, no murmurs, rubs, gallops RESPIRATORY:  Clear to auscultation without rales, wheezing or rhonchi  ABDOMEN: Soft, non-tender, non-distended MUSCULOSKELETAL:  No edema; No deformity  SKIN: Warm and dry NEUROLOGIC:  Alert and oriented x 3 PSYCHIATRIC:  Normal affect   ASSESSMENT:    1. Persistent atrial fibrillation ([place])   2. H/O: CVA (cerebrovascular accident)   3. Essential hypertension   4. Hyperlipidemia LDL goal <70   5. Memory change    PLAN:    In order of problems listed above:  Persistent atrial fibrillation: She has no cardiac awareness.  She had recurrence of atrial fibrillation after  the previous cardioversion in July 2023 despite on flecainide.  Flecainide has since been discontinued.  Alternative options include continue rate control therapy versus antiarrhythmic therapy such as amiodarone and Tikosyn.  The concern is amiodarone may cause her baseline conduction disorder to worsen in the future where she would require a pacemaker.  Since she has no cardiac awareness of atrial fibrillation, I recommended continue rate control therapy for now  History of CVA: No significant recurrence.  On Eliquis for A-fib.  Hypertension: Blood pressure may remain elevated today, patient was recently seen by internal medicine service for dizziness.  Dr. Sallyanne Kuster recommended addition of Toprol-XL which she has not started yet.  I asked her to start on beta-blocker as a trial  Memory change:  This is the primary concern daughter had for today's appointment.  She noticed patient had increasing memory issues this year.  She is wondering if there is a correlation between staying in A-fib and a declining memory.  I am not aware of direct correlation between the two issue.  Although her blood pressure could be better.  History of bradycardia: Patient has a history of sinus bradycardia which suggest underlying conduction disorder.   Recent heart monitor shows no sign of bradycardia, with minimal activity, her heart rate in A-fib goes up to 110s to 120s.  Dr. Sallyanne Kuster has recommended addition of low-dose metoprolol succinate.  Will need to monitor heart rate closely for recurrent bradycardic event.            Medication Adjustments/Labs and Tests Ordered: Current medicines are reviewed at length with the patient today.  Concerns regarding medicines are outlined above.  Orders Placed This Encounter  Procedures   EKG 12-Lead   No orders of the defined types were placed in this encounter.   Patient Instructions  Medication Instructions:   Your physician recommends that you continue on your current medications as directed. Please refer to the Current Medication list given to you today.  *If you need a refill on your cardiac medications before your next appointment, please call your pharmacy*  Lab Work: NONE ordered at this time of appointment   If you have labs (blood work) drawn today and your tests are completely normal, you will receive your results only by: [facility] (if you have MyChart) OR A paper copy in the mail If you have any lab test that is abnormal or we need to change your treatment, we will call you to review the results.  Testing/Procedures: NONE ordered at this time of appointment   Follow-Up: At [facility], you and your health needs are our priority.  As part of our continuing mission to provide you with exceptional heart care, we have created  designated Provider Care Teams.  These Care Teams include your primary Cardiologist (physician) and Advanced Practice Providers (APPs -  Physician Assistants and Nurse Practitioners) who all work together to provide you with the care you need, when you need it.   Your next appointment:   As previously scheduled   The format for your next appointment:   In Person  Provider:   Sanda Klein, MD     Other Instructions  Important Information About Sugar         Hilbert Corrigan, [place]  06/11/2022 9:54 PM    [place]

## 2022-06-09 NOTE — Patient Instructions (Signed)
Medication Instructions:   Your physician recommends that you continue on your current medications as directed. Please refer to the Current Medication list given to you today.  *If you need a refill on your cardiac medications before your next appointment, please call your pharmacy*  Lab Work: NONE ordered at this time of appointment   If you have labs (blood work) drawn today and your tests are completely normal, you will receive your results only by: [facility] (if you have MyChart) OR A paper copy in the mail If you have any lab test that is abnormal or we need to change your treatment, we will call you to review the results.  Testing/Procedures: NONE ordered at this time of appointment   Follow-Up: At [facility], you and your health needs are our priority.  As part of our continuing mission to provide you with exceptional heart care, we have created designated Provider Care Teams.  These Care Teams include your primary Cardiologist (physician) and Advanced Practice Providers (APPs -  Physician Assistants and Nurse Practitioners) who all work together to provide you with the care you need, when you need it.   Your next appointment:   As previously scheduled   The format for your next appointment:   In Person  Provider:   Sanda Klein, MD     Other Instructions  Important Information About Sugar

## 2022-06-11 ENCOUNTER — Encounter: Payer: Self-pay | Admitting: Physician Assistant

## 2022-06-13 ENCOUNTER — Encounter: Payer: Self-pay | Admitting: Neurology

## 2022-06-13 ENCOUNTER — Ambulatory Visit: Payer: Medicare PPO | Admitting: Neurology

## 2022-06-13 VITALS — BP 188/104 | HR 88 | Ht 60.0 in | Wt 141.0 lb

## 2022-06-13 DIAGNOSIS — R269 Unspecified abnormalities of gait and mobility: Secondary | ICD-10-CM | POA: Diagnosis not present

## 2022-06-13 DIAGNOSIS — Z86011 Personal history of benign neoplasm of the brain: Secondary | ICD-10-CM

## 2022-06-13 NOTE — Patient Instructions (Addendum)
Dr. Vicie Mutters  at [facility]    [place], [place] [address]  [phone])

## 2022-06-13 NOTE — Progress Notes (Signed)
Chief Complaint  Patient presents with   New Patient (Initial Visit)    Rm 14. Alone. NP internal referral for Dizziness, meningoma. Second opinion.    ASSESSMENT AND PLAN  Jessica Mullins is a 88 y.o. female   Stable right posterior fossa mass, mass effect on the brainstem, Gait abnormality Dizziness spells, mild decreased hearing  Repeat MRIs showed stable size  She has mild truncal ataxia on examination, unsteady gait, reported frequent dizziness spells, can be related to the brainstem compression of the right posterior fossa mass,  She wants to have hearing test, refer to Dr.Eric Thornell Mule at [facility] follow-up with primary care physician, return to clinic for new issues   DIAGNOSTIC DATA (LABS, IMAGING, TESTING) - I reviewed patient records, labs, notes, testing and imaging myself where available.   MEDICAL HISTORY:  Jessica Mullins 88 year old female, seen in request by Dr. Veleta Miners for evaluation of dizziness, unsteady gait, known history of meningioma, initial evaluation was on June 14, 2019  I reviewed and summarized the referring note. PMHX. A fib- Eliquis Left breast cancer, s/p lobectomy, chemoradiation therapy HLD  She currently lives at [facility] independently managing, quit driving since 5170 worsening dizziness  For evaluation of slow worsening gait abnormality, headaches, had MRI brain with and without contrast in April 2021, personally reviewed film, dural based extra-axial mass compressing the right anterolateral medulla and craniocervical junction without parenchymal edema, appearance most consistent with meningioma  She had repeat MRI again in [place] 16, 2022, November 2022, most recent on Nov 04, 2021, reviewed multiple films, no acute infarction, small vessel disease, stable right posterior fossa meningioma, mass effect on the medulla and brainstem, no significant change  CT angiogram head and neck November 2022 showed no  large vessel disease    PHYSICAL EXAM:   Vitals:   06/13/22 1345  BP: (!) 188/104  Pulse: 88  Weight: 141 lb (64 kg)  Height: 5' (1.524 m)      Body mass index is 27.54 kg/m.  PHYSICAL EXAMNIATION:  Gen: NAD, conversant, well nourised, well groomed                     Cardiovascular: Regular rate rhythm, no peripheral edema, warm, nontender. Eyes: Conjunctivae clear without exudates or hemorrhage Neck: Supple, no carotid bruits. Pulmonary: Clear to auscultation bilaterally   NEUROLOGICAL EXAM:  MENTAL STATUS: Speech/cognition: Awake, alert, oriented to history taking and casual conversation CRANIAL NERVES: CN II: Visual fields are full to confrontation. Pupils are round equal and briskly reactive to light. OD 20/30, OS 20/400 CN III, IV, VI: extraocular movement are normal. No ptosis. CN V: Facial sensation is intact to light touch CN VII: Face is symmetric with normal eye closure  CN VIII: Hearing is mildly decreased to casual conversation, CN IX, X: Phonation is normal. CN XI: Head turning and shoulder shrug are intact  MOTOR: Mild left arm pronation drift, fixation of left arm on rapid rotating movement,  REFLEXES: Reflexes are  1 and symmetric at the biceps, triceps, knees, and ankles. Plantar responses are flexor.  SENSORY: Intact to light touch, pinprick and vibratory sensation are intact in fingers and toes.  COORDINATION: There was no limb dysmetria noted, she has mild truncal ataxia  GAIT/STANCE: Need push-up to get up from seated position, wide-based, cautious, unsteady gait,  REVIEW OF SYSTEMS:  Full 14 system review of systems performed and notable only for as above All other review of systems were  negative.   ALLERGIES: Allergies  Allergen Reactions   Sulfa Antibiotics Other (See Comments)    As a child, rigid as a stick and not responsive   Tape Other (See Comments)    Blisters, Please use "paper" tape only for short periods of time    Glucosamine Forte [Nutritional Supplements] Rash   Latex Rash    HOME MEDICATIONS: Current Outpatient Medications  Medication Sig Dispense Refill   acetaminophen (TYLENOL) 325 MG tablet Take 325 mg by mouth every 6 (six) hours as needed for moderate pain.     AMBULATORY NON FORMULARY MEDICATION Walker use daily as directed.  Disp 1 Leg pain M79.604 1 each 0   Biotin 5 MG CAPS Take 5 mg by mouth daily.      Calcium Carbonate-Vitamin D 600-400 MG-UNIT tablet Take 1 tablet by mouth daily.     Cholecalciferol (VITAMIN D3) 1000 UNITS CAPS Take 1,000 Units by mouth daily.     cycloSPORINE (RESTASIS) 0.05 % ophthalmic emulsion Place 1 drop into both eyes 2 (two) times daily.     ELIQUIS 5 MG TABS tablet TAKE ONE TABLET BY MOUTH TWICE DAILY 60 tablet 12   metoprolol succinate (TOPROL XL) 25 MG 24 hr tablet Take 25 mg every morning 90 tablet 3   Multiple Vitamins-Minerals (PRESERVISION AREDS) TABS Take 2 tablets by mouth daily.     Omega-3 Fatty Acids (FISH OIL PO) Take 1 Capful by mouth daily.     polyethylene glycol (MIRALAX / GLYCOLAX) 17 g packet Take 17 g by mouth daily as needed.     Psyllium (METAMUCIL PO) Take 1 Dose by mouth daily. 1 teaspoon     Turmeric 500 MG CAPS Take 1 capsule by mouth daily.     vitamin B-12 (CYANOCOBALAMIN) 1000 MCG tablet Take 1 tablet (1,000 mcg total) by mouth daily.     pravastatin (PRAVACHOL) 40 MG tablet TAKE ONE TABLET BY MOUTH ONCE DAILY 90 tablet 1   No current facility-administered medications for this visit.    PAST MEDICAL HISTORY: Past Medical History:  Diagnosis Date   Allergy    Ankle fracture, left    Atrial fibrillation ([place])    Per [facility] New Patient Packet    Breast cancer ([place]) 1115/16   left    Bursitis of right shoulder    Cataract    Family history of cancer    Fracture of right wrist    GERD (gastroesophageal reflux disease)    Hyperlipidemia    Hypertension    Lumbar radiculitis    Per [facility] New Patient Packet    Meningioma  [facility])    Per [place] [place] New Patient Packet    Skin cancer 2000   melanoma and basal cell   Stress fracture    Right Heel, Per [place] New Patient Packet    TIA (transient ischemic attack) 2022   Vaso vagal episode    during preparation for colonoscopy    PAST SURGICAL HISTORY: Past Surgical History:  Procedure Laterality Date   ABDOMINAL HYSTERECTOMY  1988   TAH/BSO--FIBROIDS   APPENDECTOMY     BREAST LUMPECTOMY     B/L--FCS   BREAST LUMPECTOMY WITH RADIOACTIVE SEED AND SENTINEL LYMPH NODE BIOPSY Left 06/11/2015   Procedure: LEFT BREAST LUMPECTOMY WITH RADIOACTIVE SEED AND LEFT SENTINEL LYMPH NODE MAPPING;  Surgeon: Erroll Luna, MD;  Location: [facility];  Service: General;  Laterality: Left;   CARDIOVERSION N/A 01/11/2022   Procedure: CARDIOVERSION;  Surgeon: Buford Dresser, MD;  [facility];  Service: Cardiovascular;  Laterality: N/A;   CATARACT EXTRACTION  2015   COLONOSCOPY  2010   DG  BONE DENSITY ([place] HX)     EYE SURGERY Bilateral    cataracts   fiberadenoma Bilateral 1978, 1980   GANGLION CYST EXCISION     L  hand   Lipiflow procedure      FAMILY HISTORY: Family History  Problem Relation Age of Onset   Cancer Mother        mets to bone--? primary   Coronary artery disease Brother        died of M! @ 77   Diabetes Brother    Hyperlipidemia Brother    Hypertension Brother    Heart disease Brother        chf   Heart disease Father 95       MI   Lung cancer Sister 43       former smoker   Diabetes Maternal Aunt    Lung cancer Other    Thrombosis Other        thromboembolism clotting disorder--? father died of ? clot    Hypertension Son    High Cholesterol Son     SOCIAL HISTORY: Social History   Socioeconomic History   Marital status: Widowed    Spouse name: Not on file   Number of children: 2   Years of education: Not on file   Highest education level: Master's degree (e.g., MA, MS, MEng, MEd, [name], [name])  Occupational History   Occupation:  Product manager: RETIRED    Comment: retired  Tobacco Use   Smoking status: Never   Smokeless tobacco: Never  Vaping Use   Vaping Use: Never used  Substance and Sexual Activity   Alcohol use: Yes    Comment: rare   Drug use: No   Sexual activity: Never  Other Topics Concern   Not on file  Social History Narrative   Patient is widowed.  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    One son one daughter   1 caffeinated beverage daily   She is right-handed.    She works out everyday to a televised [employer] 30 minute program.   No tobacco or alcohol         Per [facility] New Patient Packet Abstracted on 01/03/2020      Diet: Left blank       Caffeine: Yes      Married, if yes what year: Widowed, married in [place] you live in a house, apartment, assisted living, condo, trailer, ect: Apartment      Is it one or more stories: One stories, one person       Pets: 1 Neurosurgeon      Current/Past profession: Pharmacist, hospital      Highest level or education completed: MA of Education       Exercise:   Yes             Type and how often: Cardio, chair aerobics, 3-4 times weekly          Living Will: Yes   DNR: Yes   POA/HPOA: Yes      Functional Status:   Do you have difficulty bathing or dressing yourself? Left blank   Do you have difficulty preparing food or eating? Left blank   Do you have difficulty managing your medications? Left blank   Do you have difficulty managing your finances? Left  blank   Do you have difficulty affording your medications? Left blank   Social Determinants of Health   Financial Resource Strain: Low Risk  (11/14/2019)   Overall Financial Resource Strain (CARDIA)    Difficulty of Paying Living Expenses: Not hard at all  Food Insecurity: No Food Insecurity (11/14/2019)   Hunger Vital Sign    Worried About Running Out of Food in the Last Year: Never true    Ran Out of Food in the Last Year: Never true  Transportation Needs: No Transportation Needs  (11/14/2019)   PRAPARE - Hydrologist (Medical): No    Lack of Transportation (Non-Medical): No  Physical Activity: Not on file  Stress: Not on file  Social Connections: Not on file  Intimate Partner Violence: Not on file      Marcial Pacas, M.D. Ph.D.  [facility] [address], [place], [place] [address] Ph: [phone] Fax: [phone]  CC:  Virgie Dad, MD [place],  [place] [phone]  Virgie Dad, MD

## 2022-06-16 ENCOUNTER — Telehealth: Payer: Self-pay | Admitting: Neurology

## 2022-06-16 NOTE — Telephone Encounter (Signed)
ENT Referral sent to Dr. Thornell Mule, phone # [phone].

## 2022-06-21 ENCOUNTER — Non-Acute Institutional Stay: Payer: Medicare PPO | Admitting: Internal Medicine

## 2022-06-21 ENCOUNTER — Encounter: Payer: Self-pay | Admitting: Internal Medicine

## 2022-06-21 VITALS — BP 138/84 | HR 84 | Temp 97.8°F | Resp 17 | Ht 60.0 in | Wt 141.0 lb

## 2022-06-21 DIAGNOSIS — R42 Dizziness and giddiness: Secondary | ICD-10-CM | POA: Diagnosis not present

## 2022-06-21 DIAGNOSIS — I480 Paroxysmal atrial fibrillation: Secondary | ICD-10-CM

## 2022-06-21 DIAGNOSIS — W19XXXA Unspecified fall, initial encounter: Secondary | ICD-10-CM | POA: Diagnosis not present

## 2022-06-21 DIAGNOSIS — D329 Benign neoplasm of meninges, unspecified: Secondary | ICD-10-CM

## 2022-06-21 DIAGNOSIS — R269 Unspecified abnormalities of gait and mobility: Secondary | ICD-10-CM

## 2022-06-21 NOTE — Progress Notes (Signed)
Location: [place] of Service:  Clinic (12)  Provider:   Code Status:  Goals of Care:     06/21/2022   10:45 AM  Advanced Directives  Does Patient Have a Medical Advance Directive? Yes  Type of Paramedic of [place];Living will;Out of facility DNR (pink MOST or yellow form)  Does patient want to make changes to medical advance directive? No - Patient declined  Copy of [place] in Chart? Yes - validated most recent copy scanned in chart (See row information)     Chief Complaint  Patient presents with   Medical Management of Chronic Issues    Patient is being seen for an evaluation. She has been having some balance problems. Also having some pain in her back and legs    Quality Metric Gaps    Discussed the need for AWV    HPI: Patient is a 88 y.o. female seen today for an acute visit for Fall and Dizziness  Patient lives [place] in [place]  S/p Fall Patient fell on 06/12/22 in her apartment  Sustained Skin tears Had to call Security to get her up She says suddenly her legs give up and she falls Skin Tear sustained in her Left Arm. They are healing well Dizziness and unsteadiness Has seen Dr Tomi Likens and Dr Krista Blue now and They both think it is due to her Meningoma which is increasing the pressure on her Brain stem  Patient upset that she was not aware of this  Having Cognitive issues also  Says she falls in her apartment all the time and feels unsteady  PAF Recently started on Toprol for Rapid A fib per Cardiology Seen on her Cardiac Monitor  Patient has h/o Meningoma Diagnosed with MRI in 4/21 Has seen Neurosurgery H/o Low Back Pain with Radiculopathy follows with Neurology H/o Breast Cancer Invasive Ductal Carcinoma diagnosed in 2016 Osteopenia Last DEXA was -1.6 in 2020 H/o PAF  Follows with Cardiology On Eliquis Also h/o Bradycardia Hypertension with Tendency for Orthostatic Hypotension     Past  Medical History:  Diagnosis Date   Allergy    Ankle fracture, left    Atrial fibrillation ([place])    Per [facility] New Patient Packet    Breast cancer ([place]) 1115/16   left    Bursitis of right shoulder    Cataract    Family history of cancer    Fracture of right wrist    GERD (gastroesophageal reflux disease)    Hyperlipidemia    Hypertension    Lumbar radiculitis    Per [facility] New Patient Packet    Meningioma [facility])    Per [place] New Patient Packet    Skin cancer 2000   melanoma and basal cell   Stress fracture    Right Heel, Per [place] New Patient Packet    TIA (transient ischemic attack) 2022   Vaso vagal episode    during preparation for colonoscopy    Past Surgical History:  Procedure Laterality Date   ABDOMINAL HYSTERECTOMY  1988   TAH/BSO--FIBROIDS   APPENDECTOMY     BREAST LUMPECTOMY     B/L--FCS   BREAST LUMPECTOMY WITH RADIOACTIVE SEED AND SENTINEL LYMPH NODE BIOPSY Left 06/11/2015   Procedure: LEFT BREAST LUMPECTOMY WITH RADIOACTIVE SEED AND LEFT SENTINEL LYMPH NODE [place];  Surgeon: Erroll Luna, MD;  Location: [facility];  Service: General;  Laterality: Left;   CARDIOVERSION N/A 01/11/2022   Procedure: CARDIOVERSION;  Surgeon: Buford Dresser,  MD;  Location: [facility];  Service: Cardiovascular;  Laterality: N/A;   CATARACT EXTRACTION  2015   COLONOSCOPY  2010   DG  BONE DENSITY ([place] HX)     EYE SURGERY Bilateral    cataracts   fiberadenoma Bilateral 1978, 1980   GANGLION CYST EXCISION     L  hand   Lipiflow procedure      Allergies  Allergen Reactions   Sulfa Antibiotics Other (See Comments)    As a child, rigid as a stick and not responsive   Tape Other (See Comments)    Blisters, Please use "paper" tape only for short periods of time   Glucosamine Forte [Nutritional Supplements] Rash   Latex Rash    Outpatient Encounter Medications as of 06/21/2022  Medication Sig   acetaminophen (TYLENOL) 325 MG tablet Take 325 mg by mouth every 6 (six) hours as  needed for moderate pain.   AMBULATORY NON FORMULARY MEDICATION Walker use daily as directed.  Disp 1 Leg pain M79.604   Biotin 5 MG CAPS Take 5 mg by mouth daily.    Calcium Carbonate-Vitamin D 600-400 MG-UNIT tablet Take 1 tablet by mouth daily.   Cholecalciferol (VITAMIN D3) 1000 UNITS CAPS Take 1,000 Units by mouth daily.   cycloSPORINE (RESTASIS) 0.05 % ophthalmic emulsion Place 1 drop into both eyes 2 (two) times daily.   ELIQUIS 5 MG TABS tablet TAKE ONE TABLET BY MOUTH TWICE DAILY   metoprolol succinate (TOPROL XL) 25 MG 24 hr tablet Take 25 mg every morning   Multiple Vitamins-Minerals (PRESERVISION AREDS) TABS Take 2 tablets by mouth daily.   Omega-3 Fatty Acids (FISH OIL PO) Take 1 Capful by mouth daily.   polyethylene glycol (MIRALAX / GLYCOLAX) 17 g packet Take 17 g by mouth daily as needed.   pravastatin (PRAVACHOL) 40 MG tablet TAKE ONE TABLET BY MOUTH ONCE DAILY   Psyllium (METAMUCIL PO) Take 1 Dose by mouth daily. 1 teaspoon   Turmeric 500 MG CAPS Take 1 capsule by mouth daily.   vitamin B-12 (CYANOCOBALAMIN) 1000 MCG tablet Take 1 tablet (1,000 mcg total) by mouth daily.   No facility-administered encounter medications on file as of 06/21/2022.    Review of Systems:  Review of Systems  Constitutional:  Positive for activity change. Negative for appetite change.  HENT: Negative.    Respiratory:  Negative for cough and shortness of breath.   Cardiovascular:  Negative for leg swelling.  Gastrointestinal:  Negative for constipation.  Genitourinary: Negative.   Musculoskeletal:  Positive for back pain and gait problem. Negative for arthralgias and myalgias.  Skin: Negative.   Neurological:  Negative for dizziness and weakness.  Psychiatric/Behavioral:  Positive for confusion. Negative for dysphoric mood and sleep disturbance.     Health Maintenance  Topic Date Due   Medicare Annual Wellness (AWV)  09/25/2013   COVID-19 Vaccine (6 - 2023-24 season) 06/22/2022    MAMMOGRAM  07/11/2022   DTaP/Tdap/Td (5 - Td or Tdap) 10/24/2030   Pneumonia Vaccine 1+ Years old  Completed   INFLUENZA VACCINE  Completed   DEXA SCAN  Completed   Zoster Vaccines- Shingrix  Completed   HPV VACCINES  Aged Out    Physical Exam: Vitals:   06/21/22 1043  BP: 138/84  Pulse: 84  Resp: 17  Temp: 97.8 F (36.6 C)  TempSrc: Temporal  SpO2: 99%  Weight: 141 lb (64 kg)  Height: 5' (1.524 m)   Body mass index is 27.54 kg/m. Physical Exam Vitals reviewed.  Constitutional:      Appearance: Normal appearance.  HENT:     Head: Normocephalic.     Nose: Nose normal.     Mouth/Throat:     Mouth: Mucous membranes are moist.     Pharynx: Oropharynx is clear.  Eyes:     Pupils: Pupils are equal, round, and reactive to light.  Cardiovascular:     Rate and Rhythm: Normal rate. Rhythm irregular.     Pulses: Normal pulses.     Heart sounds: Normal heart sounds. No murmur heard. Pulmonary:     Effort: Pulmonary effort is normal.     Breath sounds: Normal breath sounds.  Abdominal:     General: Abdomen is flat. Bowel sounds are normal.     Palpations: Abdomen is soft.  Musculoskeletal:        General: No swelling.     Cervical back: Neck supple.  Skin:    General: Skin is warm.     Comments: 2 skin tears in left arms Both are healing well  Neurological:     General: No focal deficit present.     Mental Status: She is alert.     Comments: Walks fine with her walker   Psychiatric:        Mood and Affect: Mood normal.        Thought Content: Thought content normal.     Labs reviewed: Basic Metabolic Panel: Recent Labs    11/04/21 1144 01/06/22 1026 04/21/22 0000  NA 141 142 144  K 4.6 4.7 4.1  CL 105 103 106  CO2 30 28 27*  GLUCOSE 96 87  --   BUN _0 CREATININE 0.71 0.77 0.8  CALCIUM 9.9 10.1 9.8   Liver Function Tests: Recent Labs    11/04/21 1144 02/10/22 0000 04/21/22 0000  AST 18 37* 25  ALT 21 49* 31  ALKPHOS 49 65 68  BILITOT  0.5  --   --   PROT 6.2*  --   --   ALBUMIN 4.4 4.3 4.0   No results for input(s): "LIPASE", "AMYLASE" in the last 8760 hours. No results for input(s): "AMMONIA" in the last 8760 hours. CBC: Recent Labs    11/04/21 1144 01/06/22 1026 04/21/22 0000  WBC 6.8 7.6 7.2  NEUTROABS 4.5  --   --   HGB 14.6 14.8 15.4  HCT 43.8 44.5 46  MCV 91.8 92  --   PLT 205 226 193   Lipid Panel: Recent Labs    02/10/22 0000 04/21/22 0000  CHOL 142 122  HDL 62 58  LDLCALC 64 50  TRIG 81 66   Lab Results  Component Value Date   HGBA1C 4.8 05/11/2021    Procedures since last visit: [place] (3-14 DAYS)  Result Date: 06/07/2022   Dominant rhythm is atrial fibrillation with mostly well controlled ventricular response at rest, but with frequent mild rapid ventricular response during periods of activity.   No severe bradycardia is seen.   There are rare PVCs and a single 4 beat episode of nonsustained VT. Abnormal arrhythmia monitor due to persistent atrial fibrillation, often with rapid ventricular response during activity, but well rate controlled at rest.. Patch Wear Time:  13 days and 23 hours (2023-11-16T10:17:05-499 to 2023-11-30T09:48:16-0500) 1 run of Ventricular Tachycardia occurred lasting 4 beats with a max rate of 106 bpm (avg 104 bpm). Atrial Fibrillation occurred continuously (100% burden), ranging from 50-154 bpm (avg of 90 bpm). Isolated VEs were rare (<  1.0%), VE Couplets were rare (<1.0%), and no VE Triplets were present. Previously notified: MD notification criteria for First Documentation of Atrial Fibrillation met - notified Jordan Likes LPN on 13 May 6755 at 11:53 AM EST (RP).    Assessment/Plan 1. Fall, initial encounter [facility] on 06/12/2022 Skin tears healing Refused therapy again  2. Dizziness Due to Meningoma most likely is going to see ENT also  No More recommendations per Neurology 3. Gait abnormality Needs therapy but refusing right now I  Discussed with Facility Nurse and Her to consider AL   4. [facility]oma [facility]) Has seen Neurosurgery before and Decided for Conservative management  5. Paroxysmal atrial fibrillation (HCC) Eliquis and ToprolXl Following closely with Cardiology Still not sure if she wants Pacemaker with Antiarrhythmic drugs  Chronic Back pain Tylenol  Does not want anything stronger   Other issues History of CVA (cerebrovascular accident) On Eliquis Statin Aspirin not added per Neurology   History of breast cancer In Remission Follow with Oncology PRN Annually Mammogram  Essential hypertension Now on Toprol Pure hypercholesterolemia Continue Statin LDL 70 in 11/22  Lung nodule No Further Follow up needed     Constipation Doing well with Miralax PRN    Labs/tests ordered:  * No order type specified * Next appt:  08/01/2022

## 2022-06-21 NOTE — Patient Instructions (Signed)
Dr Vicie Mutters [phone] For ENT

## 2022-07-04 ENCOUNTER — Ambulatory Visit: Payer: Medicare PPO | Admitting: Cardiovascular Disease

## 2022-07-04 ENCOUNTER — Emergency Department (HOSPITAL_COMMUNITY): Payer: Medicare PPO

## 2022-07-04 ENCOUNTER — Encounter (HOSPITAL_COMMUNITY): Payer: Self-pay | Admitting: Radiology

## 2022-07-04 ENCOUNTER — Emergency Department (HOSPITAL_COMMUNITY)
Admission: EM | Admit: 2022-07-04 | Discharge: 2022-07-04 | Disposition: A | Discharge status: Skilled Nursing Facility | Payer: Medicare PPO | Attending: Emergency Medicine | Admitting: Emergency Medicine

## 2022-07-04 DIAGNOSIS — C719 Malignant neoplasm of brain, unspecified: Secondary | ICD-10-CM | POA: Diagnosis not present

## 2022-07-04 DIAGNOSIS — C729 Malignant neoplasm of central nervous system, unspecified: Secondary | ICD-10-CM | POA: Diagnosis not present

## 2022-07-04 DIAGNOSIS — D329 Benign neoplasm of meninges, unspecified: Secondary | ICD-10-CM | POA: Diagnosis not present

## 2022-07-04 DIAGNOSIS — I4891 Unspecified atrial fibrillation: Secondary | ICD-10-CM | POA: Diagnosis not present

## 2022-07-04 DIAGNOSIS — D320 Benign neoplasm of cerebral meninges: Secondary | ICD-10-CM | POA: Diagnosis not present

## 2022-07-04 DIAGNOSIS — R531 Weakness: Secondary | ICD-10-CM

## 2022-07-04 DIAGNOSIS — Z91040 Latex allergy status: Secondary | ICD-10-CM | POA: Diagnosis not present

## 2022-07-04 DIAGNOSIS — R270 Ataxia, unspecified: Secondary | ICD-10-CM | POA: Diagnosis not present

## 2022-07-04 DIAGNOSIS — Z7901 Long term (current) use of anticoagulants: Secondary | ICD-10-CM | POA: Insufficient documentation

## 2022-07-04 DIAGNOSIS — M47814 Spondylosis without myelopathy or radiculopathy, thoracic region: Secondary | ICD-10-CM | POA: Diagnosis not present

## 2022-07-04 DIAGNOSIS — Z853 Personal history of malignant neoplasm of breast: Secondary | ICD-10-CM | POA: Insufficient documentation

## 2022-07-04 DIAGNOSIS — Z7401 Bed confinement status: Secondary | ICD-10-CM | POA: Diagnosis not present

## 2022-07-04 DIAGNOSIS — R42 Dizziness and giddiness: Secondary | ICD-10-CM | POA: Diagnosis not present

## 2022-07-04 DIAGNOSIS — I10 Essential (primary) hypertension: Secondary | ICD-10-CM | POA: Diagnosis not present

## 2022-07-04 DIAGNOSIS — M5136 Other intervertebral disc degeneration, lumbar region: Secondary | ICD-10-CM | POA: Diagnosis not present

## 2022-07-04 LAB — COMPREHENSIVE METABOLIC PANEL
ALT: 53 U/L — ABNORMAL HIGH (ref 0–44)
AST: 31 U/L (ref 15–41)
Albumin: 3.8 g/dL (ref 3.5–5.0)
Alkaline Phosphatase: 69 U/L (ref 38–126)
Anion gap: 7 (ref 5–15)
BUN: 14 mg/dL (ref 8–23)
CO2: 29 mmol/L (ref 22–32)
Calcium: 9.3 mg/dL (ref 8.9–10.3)
Chloride: 103 mmol/L (ref 98–111)
Creatinine, Ser: 0.8 mg/dL (ref 0.44–1.00)
GFR, Estimated: >60 mL/min (ref >60)
Glucose, Bld: 99 mg/dL (ref 70–99)
Potassium: 3.9 mmol/L (ref 3.5–5.1)
Sodium: 139 mmol/L (ref 135–145)
Total Bilirubin: 1.2 mg/dL (ref 0.3–1.2)
Total Protein: 6.1 g/dL — ABNORMAL LOW (ref 6.5–8.1)

## 2022-07-04 LAB — CBC
HCT: 47 % — ABNORMAL HIGH (ref 36.0–46.0)
Hemoglobin: 15.9 g/dL — ABNORMAL HIGH (ref 12.0–15.0)
MCH: 30.8 pg (ref 26.0–34.0)
MCHC: 33.8 g/dL (ref 30.0–36.0)
MCV: 91.1 fL (ref 80.0–100.0)
Platelets: 198 10*3/uL (ref 150–400)
RBC: 5.16 MIL/uL — ABNORMAL HIGH (ref 3.87–5.11)
RDW: 15.2 % (ref 11.5–15.5)
WBC: 8 10*3/uL (ref 4.0–10.5)
nRBC: 0 % (ref 0.0–0.2)

## 2022-07-04 LAB — PROTIME-INR
INR: 1.4 — ABNORMAL HIGH (ref 0.8–1.2)
Prothrombin Time: 17.4 seconds — ABNORMAL HIGH (ref 11.4–15.2)

## 2022-07-04 MED ORDER — DEXAMETHASONE 4 MG PO TABS
10.0000 mg | ORAL_TABLET | Freq: Once | ORAL | Status: AC
  Administered 2022-07-04: 10 mg via ORAL
  Filled 2022-07-04: qty 1

## 2022-07-04 MED ORDER — IOHEXOL 300 MG/ML  SOLN
100.0000 mL | Freq: Once | INTRAMUSCULAR | Status: AC | PRN
  Administered 2022-07-04: 100 mL via INTRAVENOUS

## 2022-07-04 MED ORDER — IOHEXOL 300 MG/ML  SOLN: 80.0000 mL | Freq: Once | INTRAMUSCULAR | Status: DC | PRN

## 2022-07-04 MED ORDER — PREDNISONE 10 MG (21) PO TBPK: ORAL_TABLET | Freq: Every day | ORAL | 0 refills | Status: DC

## 2022-07-04 NOTE — ED Notes (Signed)
This Probation officer spoke with Jessica Mullins [phone] from [facility] regarding transportation back to facility. Awaiting communication.

## 2022-07-04 NOTE — ED Provider Notes (Signed)
[facility] DEPT Provider Note   CSN: [id number] Arrival date & time: 07/04/22  1101     History  Chief Complaint  Patient presents with   Weakness   Dizziness    Jessica Mullins is a 89 y.o. female.  HPI 89 year old female history of meningioma presents today from last pain planing of increasing weakness over the past month.  She states that she has had ongoing episodes of this.  Denies pain, falls, or lateralized weakness.  She is on Eliquis for atrial fibs.    Home Medications Prior to Admission medications   Medication Sig Start Date End Date Taking? Authorizing Provider  predniSONE (STERAPRED UNI-PAK 21 TAB) 10 MG (21) TBPK tablet Take by mouth daily. Take 6 tabs by mouth daily  for 2 days, then 5 tabs for 2 days, then 4 tabs for 2 days, then 3 tabs for 2 days, 2 tabs for 2 days, then 1 tab by mouth daily for 2 days 07/04/22  Yes Pattricia Boss, MD  acetaminophen (TYLENOL) 325 MG tablet Take 325 mg by mouth every 6 (six) hours as needed for moderate pain.    [provider]  AMBULATORY NON FORMULARY MEDICATION Walker use daily as directed.  Disp 1 Leg pain M79.604 05/21/19   Gregor Hams, MD  Biotin 5 MG CAPS Take 5 mg by mouth daily.     [provider]  Calcium Carbonate-Vitamin D 600-400 MG-UNIT tablet Take 1 tablet by mouth daily.    [provider]  Cholecalciferol (VITAMIN D3) 1000 UNITS CAPS Take 1,000 Units by mouth daily.    [provider]  cycloSPORINE (RESTASIS) 0.05 % ophthalmic emulsion Place 1 drop into both eyes 2 (two) times daily. 10/11/16   [provider]  ELIQUIS 5 MG TABS tablet TAKE ONE TABLET BY MOUTH TWICE DAILY 11/19/21   Gardenia Phlegm, NP  metoprolol succinate (TOPROL XL) 25 MG 24 hr tablet Take 25 mg every morning 06/08/22   Croitoru, Mihai, MD  Multiple Vitamins-Minerals (PRESERVISION AREDS) TABS Take 2 tablets by mouth daily.    [provider]  Omega-3  Fatty Acids (FISH OIL PO) Take 1 Capful by mouth daily.    [provider]  polyethylene glycol (MIRALAX / GLYCOLAX) 17 g packet Take 17 g by mouth daily as needed.    [provider]  pravastatin (PRAVACHOL) 40 MG tablet TAKE ONE TABLET BY MOUTH ONCE DAILY 11/29/21   Virgie Dad, MD  Psyllium (METAMUCIL PO) Take 1 Dose by mouth daily. 1 teaspoon    [provider]  Turmeric 500 MG CAPS Take 1 capsule by mouth daily.    [provider]  vitamin B-12 (CYANOCOBALAMIN) 1000 MCG tablet Take 1 tablet (1,000 mcg total) by mouth daily. 07/19/18   Nicholas Lose, MD      Allergies    Sulfa antibiotics, Tape, Glucosamine forte [nutritional supplements], and Latex    Review of Systems   Review of Systems  Physical Exam Updated Vital Signs BP (!) 155/115   Pulse 90   Temp (!) 97.5 F (36.4 C) (Oral)   Resp (!) 22   SpO2 96%  Physical Exam Vitals and nursing note reviewed.  HENT:     Head: Normocephalic.     Right Ear: External ear normal.     Left Ear: External ear normal.     Nose: Nose normal.     Mouth/Throat:     Pharynx: Oropharynx is clear.  Eyes:  Extraocular Movements: Extraocular movements intact.     Pupils: Pupils are equal, round, and reactive to light.  Cardiovascular:     Rate and Rhythm: Normal rate. Rhythm irregular.     Pulses: Normal pulses.  Pulmonary:     Effort: Pulmonary effort is normal.     Breath sounds: Normal breath sounds.  Abdominal:     General: Abdomen is flat. Bowel sounds are normal.  Musculoskeletal:        General: Normal range of motion.     Cervical back: Normal range of motion.  Skin:    General: Skin is warm and dry.     Capillary Refill: Capillary refill takes less than 2 seconds.  Neurological:     Mental Status: She is alert.     Cranial Nerves: No cranial nerve deficit.     Sensory: No sensory deficit.     Motor: No weakness.     Coordination: Coordination normal.     Deep Tendon Reflexes:  Reflexes normal.  Psychiatric:        Mood and Affect: Mood normal.     ED Results / Procedures / Treatments   Labs (all labs ordered are listed, but only abnormal results are displayed) Labs Reviewed  CBC - Abnormal; Notable for the following components:      Result Value   RBC 5.16 (*)    Hemoglobin 15.9 (*)    HCT 47.0 (*)    All other components within normal limits  COMPREHENSIVE METABOLIC PANEL - Abnormal; Notable for the following components:   Total Protein 6.1 (*)    ALT 53 (*)    All other components within normal limits  PROTIME-INR - Abnormal; Notable for the following components:   Prothrombin Time 17.4 (*)    INR 1.4 (*)    All other components within normal limits    EKG EKG Interpretation  Date/Time:  Monday July 04 2022 11:25:55 EST Ventricular Rate:  99 PR Interval:    QRS Duration: 84 QT Interval:  418 QTC Calculation: 537 R Axis:   -26 Text Interpretation: Atrial fibrillation Borderline left axis deviation Nonspecific T abnrm, anterolateral leads Prolonged QT interval Confirmed by Pattricia Boss [phone]) on 07/04/2022 5:20:46 PM  Radiology CT Head W or Wo Contrast  Result Date: 07/04/2022 CLINICAL DATA:  Provided history: Meningioma. Brain/CNS neoplasm, monitor. Ataxia. Dizziness and weakness. EXAM: CT HEAD WITHOUT AND WITH CONTRAST CT CERVICAL SPINE WITHOUT AND WITHOUT CONTRAST TECHNIQUE: Multidetector CT imaging of the head and cervical spine was performed following the standard protocol without and with intravenous contrast. Multiplanar CT image reconstructions of the cervical spine were also generated. RADIATION DOSE REDUCTION: This exam was performed according to the departmental dose-optimization program which includes automated exposure control, adjustment of the mA and/or kV according to patient size and/or use of iterative reconstruction technique. COMPARISON:  Brain MRI 11/04/2021.  Cervical spine CT 09/10/2020. FINDINGS: CT HEAD FINDINGS Brain: No  age advanced or lobar predominant parenchymal atrophy. Redemonstrated enhancing extra-axial mass centered within the right ventrolateral aspect of the posterior fossa. The mass measures 3.2 x 2.3 x 3.0 cm on today's exam. This is slightly increased in size from the prior brain MRI of 11/04/2021 (previously 3.0 x 2.3 x 3.0 cm when remeasured on prior). Tumor extension into the right hypoglossal canal, better appreciated on the prior brain MRI of 05/10/2021. Probable dural tail along the dorsal clivus, petrous right temporal bone and extending into the right internal auditory canal, also better appreciated on the  prior contrast-enhanced brain MRI 05/10/2021. As before, there is prominent mass effect upon the caudal aspect of the pons, on the medulla and on the upper cervical spinal cord. Mild-to-moderate patchy and ill-defined hypoattenuation within the cerebral white matter, nonspecific but compatible with chronic small vessel ischemic disease. There is no acute intracranial hemorrhage. No demarcated cortical infarct. No extra-axial fluid collection. No midline shift. Vascular: No hyperdense vessel on precontrast imaging. Atherosclerotic calcifications. Enhancement of the proximal large arterial vessels and dural venous sinuses. The posterior fossa mass posterior medially displaces the intracranial right vertebral artery. Skull: No fracture or aggressive osseous lesion. Sinuses/Orbits: No orbital mass or acute orbital finding. No significant paranasal sinus disease. CT CERVICAL SPINE FINDINGS Alignment: Levocurvature of the cervical spine. Slight grade 1 retrolisthesis at C4-C5. Slight grade 1 anterolisthesis at C6-C7. 2 mm grade 1 anterolisthesis at C7-T1, T1-T2, T2-T3 and T3-T4. Skull base and vertebrae: The basion-dental and atlanto-dental intervals are maintained.No cervical spine fracture is identified. Soft tissues and spinal canal: No paraspinal mass or collection. Disc levels: Multilevel disc space narrowing.  Most notably, advanced disc space narrowing is present at C3-C4, C4-C5, C5-C6, C6-C7 and T2-T3. As described under the CT head findings section, an extra-axial mass centered within the right ventrolateral aspect of the posterior fossa exerts prominent mass effect upon the caudal pons, upon the medulla and upon the upper cervical spinal cord. C2-C3: Small central disc protrusion. Minimal facet arthrosis. No significant spinal canal stenosis. No significant bony neural foraminal narrowing. C3-C4: Posterior disc osteophyte complex with bilateral disc osteophyte ridge/uncinate hypertrophy. Facet arthrosis. Mild relative spinal canal narrowing. Bilateral bony neural foraminal narrowing (mild right, moderate left). C4-C5: Slight grade 1 retrolisthesis. Posterior disc osteophyte complex with bilateral disc osteophyte ridge/uncinate hypertrophy. Mild relative spinal canal narrowing. Bilateral bony neural foraminal narrowing (moderate/severe right, mild to moderate left). C5-C6: Posterior disc osteophyte complex with bilateral disc osteophyte ridge/uncinate hypertrophy. Ligamentum flavum thickening and calcification. Apparent mild-to-moderate spinal canal stenosis. Moderate/severe bony neural foraminal narrowing on the right. C6-C7: Posterior disc osteophyte complex with bilateral disc osteophyte ridge/uncinate hypertrophy. No significant spinal canal stenosis. Mild bilateral bony neural foraminal narrowing. C7-T1: 2 mm grade 1 anterolisthesis. No appreciable significant disc herniation or spinal canal stenosis. No significant bony neural foraminal narrowing. Upper chest: 5 mm pulmonary nodule within the left upper lobe, unchanged in size from the prior examination of 09/10/2020, and considered benign. No follow-up imaging is recommended. Atherosclerotic plaque within the visualized aortic arch. Other: Right thyroid lobe nodule, incompletely imaged but increased in size as compared to the prior cervical spine CT of  09/10/2020 and measuring at least 1.8 cm (for instance as seen on series 10, image 76). Subcentimeter nodule more inferiorly within the right thyroid lobe, not meeting consensus criteria for ultrasound follow-up based on size. IMPRESSION: CT head: 1. 3.2 x 2.3 x 3.0 cm extra-axial mass centered within the right ventrolateral aspect of the posterior fossa, slightly increased in size from the prior brain MRI of 11/04/2021 (previously 3.0 x 2.3 x 3.0 cm). This is favored to reflect a meningioma. Although considered less likely, a hypoglossal schwannoma cannot be excluded. Similar to the prior MRI, there is prominent mass effect upon the caudal aspect of the pons, upon the medulla and upon the upper cervical spinal cord. 2. Mild-to-moderate chronic small vessel ischemic changes within the cerebral white matter. CT cervical spine: 1. As described, the posterior fossa tumor exerts mass effect upon the upper cervical spinal cord. 2. Cervical spondylosis, as outlined. 3. At C5-C6,  a posterior disc osteophyte complex contributes to apparent mild-to-moderate spinal canal stenosis. No more than mild spinal canal stenosis is appreciated at the remaining levels. 4. Multilevel bony neural foraminal narrowing, as detailed. 5. Disc degeneration is greatest at C3-C4, C4-C5, C5-C6, C6-C7 and T2-T3 (advanced at these levels). 6. Right thyroid lobe nodule, incompletely imaged but increased in size from the prior cervical spine CT of 09/10/2020 and measuring at least 1.8 cm. Given the patient's advanced age, a thyroid ultrasound may obtained for further evaluation, if clinically appropriate. 7.  Aortic Atherosclerosis (ICD10-I70.0). Electronically Signed   By: Kellie Simmering D.O.   On: 07/04/2022 16:43   CT CERVICAL SPINE W WO CONTRAST  Result Date: 07/04/2022 CLINICAL DATA:  Provided history: Meningioma. Brain/CNS neoplasm, monitor. Ataxia. Dizziness and weakness. EXAM: CT HEAD WITHOUT AND WITH CONTRAST CT CERVICAL SPINE WITHOUT AND  WITHOUT CONTRAST TECHNIQUE: Multidetector CT imaging of the head and cervical spine was performed following the standard protocol without and with intravenous contrast. Multiplanar CT image reconstructions of the cervical spine were also generated. RADIATION DOSE REDUCTION: This exam was performed according to the departmental dose-optimization program which includes automated exposure control, adjustment of the mA and/or kV according to patient size and/or use of iterative reconstruction technique. COMPARISON:  Brain MRI 11/04/2021.  Cervical spine CT 09/10/2020. FINDINGS: CT HEAD FINDINGS Brain: No age advanced or lobar predominant parenchymal atrophy. Redemonstrated enhancing extra-axial mass centered within the right ventrolateral aspect of the posterior fossa. The mass measures 3.2 x 2.3 x 3.0 cm on today's exam. This is slightly increased in size from the prior brain MRI of 11/04/2021 (previously 3.0 x 2.3 x 3.0 cm when remeasured on prior). Tumor extension into the right hypoglossal canal, better appreciated on the prior brain MRI of 05/10/2021. Probable dural tail along the dorsal clivus, petrous right temporal bone and extending into the right internal auditory canal, also better appreciated on the prior contrast-enhanced brain MRI 05/10/2021. As before, there is prominent mass effect upon the caudal aspect of the pons, on the medulla and on the upper cervical spinal cord. Mild-to-moderate patchy and ill-defined hypoattenuation within the cerebral white matter, nonspecific but compatible with chronic small vessel ischemic disease. There is no acute intracranial hemorrhage. No demarcated cortical infarct. No extra-axial fluid collection. No midline shift. Vascular: No hyperdense vessel on precontrast imaging. Atherosclerotic calcifications. Enhancement of the proximal large arterial vessels and dural venous sinuses. The posterior fossa mass posterior medially displaces the intracranial right vertebral  artery. Skull: No fracture or aggressive osseous lesion. Sinuses/Orbits: No orbital mass or acute orbital finding. No significant paranasal sinus disease. CT CERVICAL SPINE FINDINGS Alignment: Levocurvature of the cervical spine. Slight grade 1 retrolisthesis at C4-C5. Slight grade 1 anterolisthesis at C6-C7. 2 mm grade 1 anterolisthesis at C7-T1, T1-T2, T2-T3 and T3-T4. Skull base and vertebrae: The basion-dental and atlanto-dental intervals are maintained.No cervical spine fracture is identified. Soft tissues and spinal canal: No paraspinal mass or collection. Disc levels: Multilevel disc space narrowing. Most notably, advanced disc space narrowing is present at C3-C4, C4-C5, C5-C6, C6-C7 and T2-T3. As described under the CT head findings section, an extra-axial mass centered within the right ventrolateral aspect of the posterior fossa exerts prominent mass effect upon the caudal pons, upon the medulla and upon the upper cervical spinal cord. C2-C3: Small central disc protrusion. Minimal facet arthrosis. No significant spinal canal stenosis. No significant bony neural foraminal narrowing. C3-C4: Posterior disc osteophyte complex with bilateral disc osteophyte ridge/uncinate hypertrophy. Facet arthrosis. Mild relative spinal  canal narrowing. Bilateral bony neural foraminal narrowing (mild right, moderate left). C4-C5: Slight grade 1 retrolisthesis. Posterior disc osteophyte complex with bilateral disc osteophyte ridge/uncinate hypertrophy. Mild relative spinal canal narrowing. Bilateral bony neural foraminal narrowing (moderate/severe right, mild to moderate left). C5-C6: Posterior disc osteophyte complex with bilateral disc osteophyte ridge/uncinate hypertrophy. Ligamentum flavum thickening and calcification. Apparent mild-to-moderate spinal canal stenosis. Moderate/severe bony neural foraminal narrowing on the right. C6-C7: Posterior disc osteophyte complex with bilateral disc osteophyte ridge/uncinate  hypertrophy. No significant spinal canal stenosis. Mild bilateral bony neural foraminal narrowing. C7-T1: 2 mm grade 1 anterolisthesis. No appreciable significant disc herniation or spinal canal stenosis. No significant bony neural foraminal narrowing. Upper chest: 5 mm pulmonary nodule within the left upper lobe, unchanged in size from the prior examination of 09/10/2020, and considered benign. No follow-up imaging is recommended. Atherosclerotic plaque within the visualized aortic arch. Other: Right thyroid lobe nodule, incompletely imaged but increased in size as compared to the prior cervical spine CT of 09/10/2020 and measuring at least 1.8 cm (for instance as seen on series 10, image 76). Subcentimeter nodule more inferiorly within the right thyroid lobe, not meeting consensus criteria for ultrasound follow-up based on size. IMPRESSION: CT head: 1. 3.2 x 2.3 x 3.0 cm extra-axial mass centered within the right ventrolateral aspect of the posterior fossa, slightly increased in size from the prior brain MRI of 11/04/2021 (previously 3.0 x 2.3 x 3.0 cm). This is favored to reflect a meningioma. Although considered less likely, a hypoglossal schwannoma cannot be excluded. Similar to the prior MRI, there is prominent mass effect upon the caudal aspect of the pons, upon the medulla and upon the upper cervical spinal cord. 2. Mild-to-moderate chronic small vessel ischemic changes within the cerebral white matter. CT cervical spine: 1. As described, the posterior fossa tumor exerts mass effect upon the upper cervical spinal cord. 2. Cervical spondylosis, as outlined. 3. At C5-C6, a posterior disc osteophyte complex contributes to apparent mild-to-moderate spinal canal stenosis. No more than mild spinal canal stenosis is appreciated at the remaining levels. 4. Multilevel bony neural foraminal narrowing, as detailed. 5. Disc degeneration is greatest at C3-C4, C4-C5, C5-C6, C6-C7 and T2-T3 (advanced at these levels). 6.  Right thyroid lobe nodule, incompletely imaged but increased in size from the prior cervical spine CT of 09/10/2020 and measuring at least 1.8 cm. Given the patient's advanced age, a thyroid ultrasound may obtained for further evaluation, if clinically appropriate. 7.  Aortic Atherosclerosis (ICD10-I70.0). Electronically Signed   By: Kellie Simmering D.O.   On: 07/04/2022 16:43   CT THORACIC SPINE W CONTRAST  Result Date: 07/04/2022 CLINICAL DATA:  Dizziness and weakness. EXAM: CT THORACIC SPINE WITH CONTRAST TECHNIQUE: Multidetector CT images of thoracic was performed according to the standard protocol following intravenous contrast administration. RADIATION DOSE REDUCTION: This exam was performed according to the departmental dose-optimization program which includes automated exposure control, adjustment of the mA and/or kV according to patient size and/or use of iterative reconstruction technique. CONTRAST:  158m OMNIPAQUE IOHEXOL 300 MG/ML  SOLN COMPARISON:  CT chest dated June 09, 2021. FINDINGS: Alignment: Unchanged mild anterolisthesis at T2-T3 and mild retrolisthesis at T11-T12. Vertebrae: No acute fracture or focal pathologic process. Osteopenia. Paraspinal and other soft tissues: New trace bilateral pleural effusions. Unchanged cardiomegaly and trace pericardial effusion. Coronary, aortic arch, and branch vessel atherosclerotic vascular disease. Disc levels: Unchanged severe T2-T3 and moderate T11-T12 disc height loss. Similar mild disc height loss at the remaining thoracic levels. Scattered mild facet arthropathy  throughout the thoracic spine. No significant spinal canal or neuroforaminal stenosis. IMPRESSION: 1. No acute osseous abnormality. 2. Unchanged multilevel thoracic spondylosis without significant spinal canal or neuroforaminal stenosis. 3. New trace bilateral pleural effusions. 4.  Aortic Atherosclerosis (ICD10-I70.0). Electronically Signed   By: Titus Dubin M.D.   On: 07/04/2022 16:05    CT LUMBAR SPINE W CONTRAST  Result Date: 07/04/2022 CLINICAL DATA:  Ataxia. EXAM: CT LUMBAR SPINE WITH CONTRAST TECHNIQUE: Multidetector CT imaging of the lumbar spine was performed with intravenous contrast administration. RADIATION DOSE REDUCTION: This exam was performed according to the departmental dose-optimization program which includes automated exposure control, adjustment of the mA and/or kV according to patient size and/or use of iterative reconstruction technique. CONTRAST:  151m OMNIPAQUE IOHEXOL 300 MG/ML  SOLN COMPARISON:  MRI lumbar spine 10/22/2020 FINDINGS: Segmentation: 5 lumbar type vertebrae. Alignment: There is 8 mm of anterolisthesis at L4-L5 and 4 mm of anterolisthesis at L5-S1 which appears similar to prior. Alignment is otherwise anatomic. Vertebrae: No acute fracture or focal pathologic process. Paraspinal and other soft tissues: Negative. Left renal cysts noted. Disc levels: T12-L1, L1-L2 appear within normal limits. L2-L3: Broad-based disc bulge with bilateral facet arthropathy. No central canal or neural foraminal stenosis. L3-L4: Bilateral facet arthropathy and broad-based disc bulge. Moderate left and mild right neural foraminal stenosis. Mild central canal stenosis. L4-L5: Bilateral facet arthropathy and uncovertebral spurring. Moderate bilateral neural foraminal stenosis. Mild spinal canal stenosis. L5-S1: Bilateral facet arthropathy and uncovertebral spurring. Moderate bilateral neural foraminal stenosis, left greater than right. IMPRESSION: 1. No acute fracture or malalignment of the lumbar spine. 2. Multilevel degenerative changes of the lumbar spine, not significantly changed compared to prior MRI. 3. Stable anterolisthesis at L4-L5 and L5-S1. Electronically Signed   By: ARonney AstersM.D.   On: 07/04/2022 15:58    Procedures Procedures    Medications Ordered in ED Medications  dexamethasone (DECADRON) tablet 10 mg (10 mg Oral Given 07/04/22 1335)  iohexol  (OMNIPAQUE) 300 MG/ML solution 100 mL (100 mLs Intravenous Contrast Given 07/04/22 1513)    ED Course/ Medical Decision Making/ A&P Clinical Course as of 07/04/22 1732  Mon Jul 04, 2022  1617 CT of thoracic spine is reviewed no evidence of Catemine is noted radiologist interpretation notes unchanged multilevel thoracic spondylosis new trace bilateral pleural effusions [DR]  18366CT lumbar spine with contrast notes multilevel degenerative changes in the lumbar spine with no significant change from prior MRI [DR]  1706 CT head and cervical spine with and without contrast reviewed There is a extra-axial mass centered within the right ventrolateral aspect of the posterior fossa which is slightly increased in size from the prior brain MRI 11/04/2021 and favored to reflect a meningioma similarly to the prior MRI, there is point mass effect upon the caudal aspect of the pons blood in the [place] the upper cervical spine [DR]    Clinical Course User Index [DR] RPattricia Boss MD                           Medical Decision Making Amount and/or Complexity of Data Reviewed Labs: ordered. Radiology: ordered.  Risk Prescription drug management.   This patient presents to the ED for concern of weakness, this involves an extensive number of treatment options, and is a complaint that carries with it a high risk of complications and morbidity.  The differential diagnosis includes broad and includes but is not limited to stroke, intracranial hemorrhage, worsening  of known meningioma, metabolic etiologies, infection   Co morbidities that complicate the patient evaluation  Tree of meningioma   Additional history obtained: Called daughter Jessica Mullins at [phone] answer Attempted to call son Yiselle Babich at phone #[phone] and states unable to connect Discussed with daughter Jessica Mullins and aware of menigioma and prognosis Extensive discussion around placement in rehab or assisted  living Additional history obtained from progress notes [facility] Dizziness and unsteadiness Has seen Dr Tomi Likens and Dr Krista Blue now and They both think it is due to her Meningoma which is increasing the pressure on her Brain stem  From neurology note 06/13/22 Dizziness and unsteadiness Has seen Dr Tomi Likens and Dr Krista Blue now and They both think it is due to her Meningoma which is increasing the pressure on her Brain stem  Patient upset that she was not aware of this  Dizziness and unsteadiness Has seen Dr Tomi Likens and Dr Krista Blue now and They both think it is due to her Meningoma which is increasing the pressure on her Brain stem  Patient upset that she was not aware of this   Patient upset that she was not aware of this   From  External records from outside source obtained and reviewed including  Reviewed note from Dr. Grandville Silos, neurosurgery, February 08, 2021  Jessica Mullins is an 89 year old female who presents to the office today due to complaints of progressively worsening balance and intermittent &#34;rubbery&#34; feeling in her bilateral lower extremities. The patient was last seen by Dr. [name] in April of 2021 for follow-up in regards to a right foramen magnum meningioma that was incidentally found after the patient had a fall where she hit her head. At the conclusion of her last visit, it was determined that she will proceed with conservative management and continued assessment of growth. Today, she reports that her balance difficulties are becoming more &#34;prevalent.&#34; She also reports more episodes of her rubbery leg type feeling that she gets. She currently reports her pain to be a 0/10. She was unable to report any alleviating or exacerbating factors.Past medical history: Breast cancer, atrial fibrillation   Functional Status  Lab Tests:  I Ordered, and personally interpreted labs.  The pertinent results include: CBC returned normal and no risk abnormality, complete metabolic panel  reviewed interpreted [facility] abnormality   Imaging Studies ordered:  I ordered imaging studies including CT head and spine with presumed meningioma, mass slightly enlarged but generally appears stable I independently visualized and interpreted imaging which showed CT head and spine I agree with the radiologist interpretation   Cardiac Monitoring: / EKG:  The patient was maintained on a cardiac monitor.  I personally viewed and interpreted the cardiac monitored which showed an underlying rhythm of: Mains in A-fib   Consultations Obtained:  I requested consultation with the neurosurgery,  and discussed lab and imaging findings as well as pertinent plan - they recommend: No further intervention   Problem List / ED Course / Critical interventions / Medication management  Generalized weakness I ordered medication including decadron  for question of symptoms from increased meningioma  Reevaluation of the patient after these medicines showed that the patient improved I have reviewed the patients home medicines and have made adjustments as needed   Social Determinants of Health:  elderly   Test / Admission - Considered:  Discussed care with patient, daughter, and neurosurgeon. Neurosurgery does not feel there is any indication for intervention Patient was started on Decadron here She appears to be  at her baseline. Discussed again with daughter and with patient that she is going back to [facility].  They plan for her to go into the rehab area.  She has Discussed assisted living but does not wish that at this time.        Final Clinical Impression(s) / ED Diagnoses Final diagnoses:  Weakness  Meningioma ([place])    Rx / DC Orders ED Discharge Orders          Ordered    predniSONE (STERAPRED UNI-PAK 21 TAB) 10 MG (21) TBPK tablet  Daily        07/04/22 1731              Pattricia Boss, MD 07/04/22 1733

## 2022-07-04 NOTE — Care Management (Addendum)
This RNCM received secure chat regarding HHPT services at [facility]. This RNCM spoke with Alexa at [facility] who provided fax # [phone] for HHPT orders and  request to receive report before patient transported back to [facility]. This RNCM faxed documents to [facility] and notified EDP and RN of report phone number [phone].  No additional TOC needs at this time.

## 2022-07-04 NOTE — Discharge Instructions (Signed)
You were seen here in the emergency department for weakness You were evaluated with labs and CT scans and EKG No evidence of bleeding or new abnormalities are noted on your CT scans. You were started on steroids for possible swelling around the site of the meningioma. You received your first dose of medicine in the emergency department and can pick up your medication and take it tomorrow. Please call your Dr. tomorrow for follow-up this week

## 2022-07-04 NOTE — ED Notes (Signed)
Patient transported to CT 

## 2022-07-04 NOTE — ED Triage Notes (Signed)
Pt BIBA from [facility] for dizziness and weakness. Pt reports that this morning her legs 'just wouldn't hold'. Dizziness with standing. Has has some similar episodes in the past. Grip strength equal, no facial droop, no slurring. Denies fall today but had recent falls. Takes eliquis. A&Ox4  156/90 HR 94 96% RA CBG 120

## 2022-07-05 ENCOUNTER — Encounter: Payer: Self-pay | Admitting: Orthopedic Surgery

## 2022-07-05 ENCOUNTER — Non-Acute Institutional Stay (SKILLED_NURSING_FACILITY): Payer: Medicare PPO | Admitting: Orthopedic Surgery

## 2022-07-05 DIAGNOSIS — R269 Unspecified abnormalities of gait and mobility: Secondary | ICD-10-CM | POA: Diagnosis not present

## 2022-07-05 DIAGNOSIS — D329 Benign neoplasm of meninges, unspecified: Secondary | ICD-10-CM

## 2022-07-05 DIAGNOSIS — R42 Dizziness and giddiness: Secondary | ICD-10-CM | POA: Diagnosis not present

## 2022-07-05 DIAGNOSIS — R296 Repeated falls: Secondary | ICD-10-CM | POA: Diagnosis not present

## 2022-07-05 DIAGNOSIS — E041 Nontoxic single thyroid nodule: Secondary | ICD-10-CM

## 2022-07-05 DIAGNOSIS — I480 Paroxysmal atrial fibrillation: Secondary | ICD-10-CM

## 2022-07-05 NOTE — Progress Notes (Signed)
Location:  [place] Room Number: [place] of Service:  SNF ([phone]) Provider:  [name], NP   Patient Care Team: Virgie Dad, MD as PCP - General (Internal Medicine) Croitoru, Dani Gobble, MD as PCP - Cardiology (Cardiology) Allyn Kenner, MD as Consulting Physician (Dermatology) Calvert Cantor, MD as Consulting Physician (Ophthalmology) Pieter Partridge, DO as Consulting Physician (Neurology) Delice Bison Charlestine Massed, NP as Nurse Practitioner (Hematology and Oncology)  Extended Emergency Contact Information Primary Emergency Contact: Ray,Ruth  Johnnette Litter of [place] Phone: [phone] Mobile Phone: [phone] Relation: Daughter Secondary Emergency Contact: Eudelia Bunch [place] of [place] Phone: [phone] Relation: Son  Code Status:  DNR Goals of care: Advanced Directive information    07/05/2022   10:24 AM  Advanced Directives  Does Patient Have a Medical Advance Directive? Yes  Type of Paramedic of [place];Living will;Out of facility DNR (pink MOST or yellow form)  Does patient want to make changes to medical advance directive? No - Patient declined  Copy of [place] in Chart? Yes - validated most recent copy scanned in chart (See row information)     Chief Complaint  Patient presents with   Hospitalization [facility] Follow up for begin seen on yesterday for weakness and dizziness.    HPI:  Pt is a 89 y.o. female seen today for f/u s/p ED visit 07/04/2022.   07/04/2022 she presented to the ED due to progressive weakness. Weakness began about 1 month ago. She also had some falls without injury. In the ED, vitals stable. EKG atrial fibrillation. WBC 8.0, hgb 15.9, hct 47, Na+ 139, K+ 3.9, BUN/creat 14/0.80, AST 31, ALT 53, PT 17.4, INR 1.4. CT head noted meningioma 3.2 x 2.3 x 3.0 to right ventrolateral aspect of posterior fossa> pressing against brainstem, mild to  moderate chronic small vessel ischemic changes within white matter. CT spine with no acute changes, mild spinal stenosis to C5-C6, disc degeneration to C3-C4, C4-C5, C5-C6, C6-C7, T2-T3. CT imaging did note right thyroid lobe nodule with possible enlargement. Thyroid ultrasound recommended. She has been evaluated by neurology in the past (Dr. Tomi Likens and Dr. Krista Blue) for dizziness. Dizziness thought to be related to meningioma. Neurosurgery did not recommend further intervention at this time. She was given one dose of decadron and 500 cc fluid bolus in ED. Discharged to [facility] rehabilitation for PT/OT evaluation. Also discharged with prednisone taper.   Today, she describes her dizziness as lightheadedness. Reports increased lightheadedness when changing position. Denies room spinning, N/V. Denies generalized pain, chest pain, and sob. Reports eating 2 meals a day. LBM 2 days ago. Ambulates with rolator. She misses her cat, Jun[place]ug. She hopes to discharge back to [place] to be with her pet.   Past Medical History:  Diagnosis Date   Allergy    Ankle fracture, left    Atrial fibrillation ([place])    Per [place] New Patient Packet    Breast cancer ([place]) 1115/16   left    Bursitis of right shoulder    Cataract    Family history of cancer    Fracture of right wrist    GERD (gastroesophageal reflux disease)    Hyperlipidemia    Hypertension    Lumbar radiculitis    Per [place] New Patient Packet    Meningioma [facility])    Per [place] New Patient Packet    Skin cancer 2000   melanoma and basal cell   Stress fracture  Right Heel, Per [place] New Patient Packet    TIA (transient ischemic attack) 2022   Vaso vagal episode    during preparation for colonoscopy   Past Surgical History:  Procedure Laterality Date   ABDOMINAL HYSTERECTOMY  1988   TAH/BSO--FIBROIDS   APPENDECTOMY     BREAST LUMPECTOMY     B/L--FCS   BREAST LUMPECTOMY WITH RADIOACTIVE SEED AND SENTINEL LYMPH NODE BIOPSY Left 06/11/2015   Procedure: LEFT  BREAST LUMPECTOMY WITH RADIOACTIVE SEED AND LEFT SENTINEL LYMPH NODE MAPPING;  Surgeon: Erroll Luna, MD;  Location: [place];  Service: General;  Laterality: Left;   CARDIOVERSION N/A 01/11/2022   Procedure: CARDIOVERSION;  Surgeon: Buford Dresser, MD;  Location: [place];  Service: Cardiovascular;  Laterality: N/A;   CATARACT EXTRACTION  2015   COLONOSCOPY  2010   DG  BONE DENSITY ([place] H[facility])     EYE SURGERY Bilateral    cataracts   fiberadenoma Bilateral 1978, 1980   GANGLION CYST EXCISION     L  hand   Lipiflow procedure      Allergies  Allergen Reactions   Sulfa Antibiotics Other (See Comments)    As a child, rigid as a stick and not responsive   Tape Other (See Comments)    Blisters, Please use "paper" tape only for short periods of time   Glucosamine Forte [Nutritional Supplements] Rash   Latex Rash    Outpatient Encounter Medications as of 07/05/2022  Medication Sig   acetaminophen (TYLENOL) 325 MG tablet Take 325 mg by mouth every 6 (six) hours as needed for moderate pain.   Biotin 5 MG CAPS Take 5 mg by mouth daily.    Calcium Carbonate-Vitamin D 600-400 MG-UNIT tablet Take 1 tablet by mouth daily.   Cholecalciferol (VITAMIN D3) 1000 UNITS CAPS Take 1,000 Units by mouth daily.   cycloSPORINE (RESTASIS) 0.05 % ophthalmic emulsion Place 1 drop into both eyes 2 (two) times daily.   ELIQUIS 5 MG TABS tablet TAKE ONE TABLET BY MOUTH TWICE DAILY   metoprolol succinate (TOPROL XL) 25 MG 24 hr tablet Take 25 mg every morning   Multiple Vitamins-Minerals (PRESERVISION AREDS) TABS Take 2 tablets by mouth daily.   Omega-3 Fatty Acids (FISH OIL PO) Take 1 Capful by mouth daily.   polyethylene glycol (MIRALAX / GLYCOLAX) 17 g packet Take 17 g by mouth daily as needed.   pravastatin (PRAVACHOL) 40 MG tablet TAKE ONE TABLET BY MOUTH ONCE DAILY   predniSONE (STERAPRED UNI-PAK 21 TAB) 10 MG (21) TBPK tablet Take by mouth daily. Take 6 tabs by mouth daily  for 2 days, then 5 tabs  for 2 days, then 4 tabs for 2 days, then 3 tabs for 2 days, 2 tabs for 2 days, then 1 tab by mouth daily for 2 days   Psyllium (METAMUCIL PO) Take 1 Dose by mouth daily. 1 teaspoon   Turmeric 500 MG CAPS Take 1 capsule by mouth daily.   vitamin B-12 (CYANOCOBALAMIN) 1000 MCG tablet Take 1 tablet (1,000 mcg total) by mouth daily.   [DISCONTINUED] AMBULATORY NON FORMULARY MEDICATION Walker use daily as directed.  Disp 1 Leg pain M79.604   No facility-administered encounter medications on file as of 07/05/2022.    Review of Systems  Constitutional:  Negative for activity change and appetite change.  HENT:  Negative for congestion and trouble swallowing.   Eyes:  Negative for visual disturbance.  Respiratory:  Negative for cough, shortness of breath and wheezing.   Cardiovascular:  Negative  for chest pain and leg swelling.  Gastrointestinal:  Positive for constipation. Negative for abdominal distention, abdominal pain, diarrhea, nausea and vomiting.  Genitourinary:  Negative for dysuria.  Musculoskeletal:  Positive for gait problem.  Skin:  Negative for wound.  Neurological:  Positive for dizziness, weakness and light-headedness.  Psychiatric/Behavioral:  Negative for confusion, dysphoric mood and sleep disturbance. The patient is not nervous/anxious.     Immunization History  Administered Date(s) Administered   Fluad Quad(high Dose 65+) 02/27/2020, 03/29/2022   H1N1 07/01/2008   Influenza Whole 04/10/2007, 04/01/2009, 03/26/2010, 03/25/2012   Influenza, High Dose Seasonal PF 04/16/2014, 04/09/2015, 04/03/2017, 04/19/2018, 02/28/2019, 03/08/2021   Influenza-Unspecified 06/27/2016, 04/03/2017   Moderna Sars-Covid-2 Vaccination 04/27/2022   PFIZER Comirnaty(Gray Top)Covid-19 Tri-Sucrose Vaccine 01/05/2021   PFIZER(Purple Top)SARS-COV-2 Vaccination 07/18/2019, 08/08/2019, 03/29/2020   Pneumococcal Conjugate-13 05/13/2014   Pneumococcal Polysaccharide-23 08/29/2016   Td 11/05/2003   Tdap  06/28/2011, 09/19/2011, 10/23/2020   Zoster Recombinat (Shingrix) 10/08/2016, 01/03/2017   Zoster, Live 02/24/2010   Pertinent  Health Maintenance Due  Topic Date Due   MAMMOGRAM  07/11/2022   INFLUENZA VACCINE  Completed   DEXA SCAN  Completed      04/19/2022    3:58 PM 05/17/2022    2:36 PM 06/21/2022   10:45 AM 07/04/2022   11:33 AM 07/05/2022   10:24 AM  Fall Risk  Falls in the past year? 0 0 1  0  Was there an injury with Fall? 0 0 1  0  Fall Risk Category Calculator 0 0 3  0  Fall Risk Category Low Low High  Low  Patient Fall Risk Level Low fall risk Moderate fall risk High fall risk High fall risk High fall risk  Patient at Risk for Falls Due to Impaired balance/gait;Impaired mobility History of fall(s);Impaired balance/gait History of fall(s);Impaired balance/gait;Impaired mobility  History of fall(s);Impaired balance/gait;Impaired mobility  Patient at Risk for Falls Due to - Comments  has a walker     Fall risk Follow up Falls evaluation completed Falls evaluation completed Falls evaluation completed  Falls evaluation completed   Functional Status Survey:    Vitals:   07/05/22 1016  BP: 126/78  Pulse: 93  Resp: 20  Temp: 97.6 F (36.4 C)  SpO2: 100%  Height: 5' (1.524 m)   Body mass index is 27.54 kg/m. Physical Exam Vitals reviewed.  Constitutional:      General: She is not in acute distress. HENT:     Head: Normocephalic.     Right Ear: There is no impacted cerumen.     Left Ear: There is no impacted cerumen.     Nose: Nose normal.     Mouth/Throat:     Mouth: Mucous membranes are moist.  Eyes:     General:        Right eye: No discharge.        Left eye: No discharge.  Neck:     Vascular: No carotid bruit.  Cardiovascular:     Rate and Rhythm: Normal rate. Rhythm irregular.     Pulses: Normal pulses.     Heart sounds: Normal heart sounds.  Pulmonary:     Effort: Pulmonary effort is normal. No respiratory distress.     Breath sounds: Normal  breath sounds. No wheezing.  Abdominal:     General: Bowel sounds are normal. There is no distension.     Palpations: Abdomen is soft.     Tenderness: There is no abdominal tenderness.  Musculoskeletal:  Cervical back: Neck supple.     Right lower leg: No edema.     Left lower leg: No edema.  Lymphadenopathy:     Cervical: No cervical adenopathy.  Skin:    General: Skin is warm and dry.     Capillary Refill: Capillary refill takes less than 2 seconds.  Neurological:     General: No focal deficit present.     Mental Status: She is alert and oriented to person, place, and time.     Motor: Weakness present.     Gait: Gait abnormal.     Comments: rolator  Psychiatric:        Mood and Affect: Mood normal.        Behavior: Behavior normal.     Labs reviewed: Recent Labs    11/04/21 1144 01/06/22 1026 04/21/22 0000 07/04/22 1219  NA 141 142 144 139  K 4.6 4.7 4.1 3.9  CL 105 103 106 103  CO2 30 28 27* 29  GLUCOSE 96 87  --  99  BUN '17 16 16 14  '$ CREATININE 0.71 0.77 0.8 0.80  CALCIUM 9.9 10.1 9.8 9.3   Recent Labs    11/04/21 1144 02/10/22 0000 04/21/22 0000 07/04/22 1219  AST 18 37* 25 31  ALT 21 49* 31 53*  ALKPHOS 49 65 68 69  BILITOT 0.5  --   --  1.2  PROT 6.2*  --   --  6.1*  ALBUMIN 4.4 4.3 4.0 3.8   Recent Labs    11/04/21 1144 01/06/22 1026 04/21/22 0000 07/04/22 1219  WBC 6.8 7.6 7.2 8.0  NEUTROABS 4.5  --   --   --   HGB 14.6 14.8 15.4 15.9*  HCT 43.8 44.5 46 47.0*  MCV 91.8 92  --  91.1  PLT 205 226 193 198   Lab Results  Component Value Date   TSH 2.99 02/11/2021   Lab Results  Component Value Date   HGBA1C 4.8 05/11/2021   Lab Results  Component Value Date   CHOL 122 04/21/2022   HDL 58 04/21/2022   LDLCALC 50 04/21/2022   LDLDIRECT 153.4 11/16/2009   TRIG 66 04/21/2022   CHOLHDL 2.5 05/11/2021    Significant Diagnostic Results in last 30 days:  CT Head W or Wo Contrast  Result Date: 07/04/2022 CLINICAL DATA:  Provided  history: Meningioma. Brain/CNS neoplasm, monitor. Ataxia. Dizziness and weakness. EXAM: CT HEAD WITHOUT AND WITH CONTRAST CT CERVICAL SPINE WITHOUT AND WITHOUT CONTRAST TECHNIQUE: Multidetector CT imaging of the head and cervical spine was performed following the standard protocol without and with intravenous contrast. Multiplanar CT image reconstructions of the cervical spine were also generated. RADIATION DOSE REDUCTION: This exam was performed according to the departmental dose-optimization program which includes automated exposure control, adjustment of the mA and/or kV according to patient size and/or use of iterative reconstruction technique. COMPARISON:  Brain MRI 11/04/2021.  Cervical spine CT 09/10/2020. FINDINGS: CT HEAD FINDINGS Brain: No age advanced or lobar predominant parenchymal atrophy. Redemonstrated enhancing extra-axial mass centered within the right ventrolateral aspect of the posterior fossa. The mass measures 3.2 x 2.3 x 3.0 cm on today's exam. This is slightly increased in size from the prior brain MRI of 11/04/2021 (previously 3.0 x 2.3 x 3.0 cm when remeasured on prior). Tumor extension into the right hypoglossal canal, better appreciated on the prior brain MRI of 05/10/2021. Probable dural tail along the dorsal clivus, petrous right temporal bone and extending into the right internal auditory  canal, also better appreciated on the prior contrast-enhanced brain MRI 05/10/2021. As before, there is prominent mass effect upon the caudal aspect of the pons, on the medulla and on the upper cervical spinal cord. Mild-to-moderate patchy and ill-defined hypoattenuation within the cerebral white matter, nonspecific but compatible with chronic small vessel ischemic disease. There is no acute intracranial hemorrhage. No demarcated cortical infarct. No extra-axial fluid collection. No midline shift. Vascular: No hyperdense vessel on precontrast imaging. Atherosclerotic calcifications. Enhancement of the  proximal large arterial vessels and dural venous sinuses. The posterior fossa mass posterior medially displaces the intracranial right vertebral artery. Skull: No fracture or aggressive osseous lesion. Sinuses/Orbits: No orbital mass or acute orbital finding. No significant paranasal sinus disease. CT CERVICAL SPINE FINDINGS Alignment: Levocurvature of the cervical spine. Slight grade 1 retrolisthesis at C4-C5. Slight grade 1 anterolisthesis at C6-C7. 2 mm grade 1 anterolisthesis at C7-T1, T1-T2, T2-T3 and T3-T4. Skull base and vertebrae: The basion-dental and atlanto-dental intervals are maintained.No cervical spine fracture is identified. Soft tissues and spinal canal: No paraspinal mass or collection. Disc levels: Multilevel disc space narrowing. Most notably, advanced disc space narrowing is present at C3-C4, C4-C5, C5-C6, C6-C7 and T2-T3. As described under the CT head findings section, an extra-axial mass centered within the right ventrolateral aspect of the posterior fossa exerts prominent mass effect upon the caudal pons, upon the medulla and upon the upper cervical spinal cord. C2-C3: Small central disc protrusion. Minimal facet arthrosis. No significant spinal canal stenosis. No significant bony neural foraminal narrowing. C3-C4: Posterior disc osteophyte complex with bilateral disc osteophyte ridge/uncinate hypertrophy. Facet arthrosis. Mild relative spinal canal narrowing. Bilateral bony neural foraminal narrowing (mild right, moderate left). C4-C5: Slight grade 1 retrolisthesis. Posterior disc osteophyte complex with bilateral disc osteophyte ridge/uncinate hypertrophy. Mild relative spinal canal narrowing. Bilateral bony neural foraminal narrowing (moderate/severe right, mild to moderate left). C5-C6: Posterior disc osteophyte complex with bilateral disc osteophyte ridge/uncinate hypertrophy. Ligamentum flavum thickening and calcification. Apparent mild-to-moderate spinal canal stenosis.  Moderate/severe bony neural foraminal narrowing on the right. C6-C7: Posterior disc osteophyte complex with bilateral disc osteophyte ridge/uncinate hypertrophy. No significant spinal canal stenosis. Mild bilateral bony neural foraminal narrowing. C7-T1: 2 mm grade 1 anterolisthesis. No appreciable significant disc herniation or spinal canal stenosis. No significant bony neural foraminal narrowing. Upper chest: 5 mm pulmonary nodule within the left upper lobe, unchanged in size from the prior examination of 09/10/2020, and considered benign. No follow-up imaging is recommended. Atherosclerotic plaque within the visualized aortic arch. Other: Right thyroid lobe nodule, incompletely imaged but increased in size as compared to the prior cervical spine CT of 09/10/2020 and measuring at least 1.8 cm (for instance as seen on series 10, image 76). Subcentimeter nodule more inferiorly within the right thyroid lobe, not meeting consensus criteria for ultrasound follow-up based on size. IMPRESSION: CT head: 1. 3.2 x 2.3 x 3.0 cm extra-axial mass centered within the right ventrolateral aspect of the posterior fossa, slightly increased in size from the prior brain MRI of 11/04/2021 (previously 3.0 x 2.3 x 3.0 cm). This is favored to reflect a meningioma. Although considered less likely, a hypoglossal schwannoma cannot be excluded. Similar to the prior MRI, there is prominent mass effect upon the caudal aspect of the pons, upon the medulla and upon the upper cervical spinal cord. 2. Mild-to-moderate chronic small vessel ischemic changes within the cerebral white matter. CT cervical spine: 1. As described, the posterior fossa tumor exerts mass effect upon the upper cervical spinal cord. 2. Cervical  spondylosis, as outlined. 3. At C5-C6, a posterior disc osteophyte complex contributes to apparent mild-to-moderate spinal canal stenosis. No more than mild spinal canal stenosis is appreciated at the remaining levels. 4. Multilevel  bony neural foraminal narrowing, as detailed. 5. Disc degeneration is greatest at C3-C4, C4-C5, C5-C6, C6-C7 and T2-T3 (advanced at these levels). 6. Right thyroid lobe nodule, incompletely imaged but increased in size from the prior cervical spine CT of 09/10/2020 and measuring at least 1.8 cm. Given the patient's advanced age, a thyroid ultrasound may obtained for further evaluation, if clinically appropriate. 7.  Aortic Atherosclerosis (ICD10-I70.0). Electronically Signed   By: Kellie Simmering D.O.   On: 07/04/2022 16:43   CT CERVICAL SPINE W WO CONTRAST  Result Date: 07/04/2022 CLINICAL DATA:  Provided history: Meningioma. Brain/CNS neoplasm, monitor. Ataxia. Dizziness and weakness. EXAM: CT HEAD WITHOUT AND WITH CONTRAST CT CERVICAL SPINE WITHOUT AND WITHOUT CONTRAST TECHNIQUE: Multidetector CT imaging of the head and cervical spine was performed following the standard protocol without and with intravenous contrast. Multiplanar CT image reconstructions of the cervical spine were also generated. RADIATION DOSE REDUCTION: This exam was performed according to the departmental dose-optimization program which includes automated exposure control, adjustment of the mA and/or kV according to patient size and/or use of iterative reconstruction technique. COMPARISON:  Brain MRI 11/04/2021.  Cervical spine CT 09/10/2020. FINDINGS: CT HEAD FINDINGS Brain: No age advanced or lobar predominant parenchymal atrophy. Redemonstrated enhancing extra-axial mass centered within the right ventrolateral aspect of the posterior fossa. The mass measures 3.2 x 2.3 x 3.0 cm on today's exam. This is slightly increased in size from the prior brain MRI of 11/04/2021 (previously 3.0 x 2.3 x 3.0 cm when remeasured on prior). Tumor extension into the right hypoglossal canal, better appreciated on the prior brain MRI of 05/10/2021. Probable dural tail along the dorsal clivus, petrous right temporal bone and extending into the right internal  auditory canal, also better appreciated on the prior contrast-enhanced brain MRI 05/10/2021. As before, there is prominent mass effect upon the caudal aspect of the pons, on the medulla and on the upper cervical spinal cord. Mild-to-moderate patchy and ill-defined hypoattenuation within the cerebral white matter, nonspecific but compatible with chronic small vessel ischemic disease. There is no acute intracranial hemorrhage. No demarcated cortical infarct. No extra-axial fluid collection. No midline shift. Vascular: No hyperdense vessel on precontrast imaging. Atherosclerotic calcifications. Enhancement of the proximal large arterial vessels and dural venous sinuses. The posterior fossa mass posterior medially displaces the intracranial right vertebral artery. Skull: No fracture or aggressive osseous lesion. Sinuses/Orbits: No orbital mass or acute orbital finding. No significant paranasal sinus disease. CT CERVICAL SPINE FINDINGS Alignment: Levocurvature of the cervical spine. Slight grade 1 retrolisthesis at C4-C5. Slight grade 1 anterolisthesis at C6-C7. 2 mm grade 1 anterolisthesis at C7-T1, T1-T2, T2-T3 and T3-T4. Skull base and vertebrae: The basion-dental and atlanto-dental intervals are maintained.No cervical spine fracture is identified. Soft tissues and spinal canal: No paraspinal mass or collection. Disc levels: Multilevel disc space narrowing. Most notably, advanced disc space narrowing is present at C3-C4, C4-C5, C5-C6, C6-C7 and T2-T3. As described under the CT head findings section, an extra-axial mass centered within the right ventrolateral aspect of the posterior fossa exerts prominent mass effect upon the caudal pons, upon the medulla and upon the upper cervical spinal cord. C2-C3: Small central disc protrusion. Minimal facet arthrosis. No significant spinal canal stenosis. No significant bony neural foraminal narrowing. C3-C4: Posterior disc osteophyte complex with bilateral disc osteophyte  ridge/uncinate hypertrophy. Facet arthrosis. Mild relative spinal canal narrowing. Bilateral bony neural foraminal narrowing (mild right, moderate left). C4-C5: Slight grade 1 retrolisthesis. Posterior disc osteophyte complex with bilateral disc osteophyte ridge/uncinate hypertrophy. Mild relative spinal canal narrowing. Bilateral bony neural foraminal narrowing (moderate/severe right, mild to moderate left). C5-C6: Posterior disc osteophyte complex with bilateral disc osteophyte ridge/uncinate hypertrophy. Ligamentum flavum thickening and calcification. Apparent mild-to-moderate spinal canal stenosis. Moderate/severe bony neural foraminal narrowing on the right. C6-C7: Posterior disc osteophyte complex with bilateral disc osteophyte ridge/uncinate hypertrophy. No significant spinal canal stenosis. Mild bilateral bony neural foraminal narrowing. C7-T1: 2 mm grade 1 anterolisthesis. No appreciable significant disc herniation or spinal canal stenosis. No significant bony neural foraminal narrowing. Upper chest: 5 mm pulmonary nodule within the left upper lobe, unchanged in size from the prior examination of 09/10/2020, and considered benign. No follow-up imaging is recommended. Atherosclerotic plaque within the visualized aortic arch. Other: Right thyroid lobe nodule, incompletely imaged but increased in size as compared to the prior cervical spine CT of 09/10/2020 and measuring at least 1.8 cm (for instance as seen on series 10, image 76). Subcentimeter nodule more inferiorly within the right thyroid lobe, not meeting consensus criteria for ultrasound follow-up based on size. IMPRESSION: CT head: 1. 3.2 x 2.3 x 3.0 cm extra-axial mass centered within the right ventrolateral aspect of the posterior fossa, slightly increased in size from the prior brain MRI of 11/04/2021 (previously 3.0 x 2.3 x 3.0 cm). This is favored to reflect a meningioma. Although considered less likely, a hypoglossal schwannoma cannot be  excluded. Similar to the prior MRI, there is prominent mass effect upon the caudal aspect of the pons, upon the medulla and upon the upper cervical spinal cord. 2. Mild-to-moderate chronic small vessel ischemic changes within the cerebral white matter. CT cervical spine: 1. As described, the posterior fossa tumor exerts mass effect upon the upper cervical spinal cord. 2. Cervical spondylosis, as outlined. 3. At C5-C6, a posterior disc osteophyte complex contributes to apparent mild-to-moderate spinal canal stenosis. No more than mild spinal canal stenosis is appreciated at the remaining levels. 4. Multilevel bony neural foraminal narrowing, as detailed. 5. Disc degeneration is greatest at C3-C4, C4-C5, C5-C6, C6-C7 and T2-T3 (advanced at these levels). 6. Right thyroid lobe nodule, incompletely imaged but increased in size from the prior cervical spine CT of 09/10/2020 and measuring at least 1.8 cm. Given the patient's advanced age, a thyroid ultrasound may obtained for further evaluation, if clinically appropriate. 7.  Aortic Atherosclerosis (ICD10-I70.0). Electronically Signed   By: Kellie Simmering D.O.   On: 07/04/2022 16:43   CT THORACIC SPINE W CONTRAST  Result Date: 07/04/2022 CLINICAL DATA:  Dizziness and weakness. EXAM: CT THORACIC SPINE WITH CONTRAST TECHNIQUE: Multidetector CT images of thoracic was performed according to the standard protocol following intravenous contrast administration. RADIATION DOSE REDUCTION: This exam was performed according to the departmental dose-optimization program which includes automated exposure control, adjustment of the mA and/or kV according to patient size and/or use of iterative reconstruction technique. CONTRAST:  120m OMNIPAQUE IOHEXOL 300 MG/ML  SOLN COMPARISON:  CT chest dated June 09, 2021. FINDINGS: Alignment: Unchanged mild anterolisthesis at T2-T3 and mild retrolisthesis at T11-T12. Vertebrae: No acute fracture or focal pathologic process. Osteopenia.  Paraspinal and other soft tissues: New trace bilateral pleural effusions. Unchanged cardiomegaly and trace pericardial effusion. Coronary, aortic arch, and branch vessel atherosclerotic vascular disease. Disc levels: Unchanged severe T2-T3 and moderate T11-T12 disc height loss. Similar mild disc height loss at the  remaining thoracic levels. Scattered mild facet arthropathy throughout the thoracic spine. No significant spinal canal or neuroforaminal stenosis. IMPRESSION: 1. No acute osseous abnormality. 2. Unchanged multilevel thoracic spondylosis without significant spinal canal or neuroforaminal stenosis. 3. New trace bilateral pleural effusions. 4.  Aortic Atherosclerosis (ICD10-I70.0). Electronically Signed   By: Titus Dubin M.D.   On: 07/04/2022 16:05   CT LUMBAR SPINE W CONTRAST  Result Date: 07/04/2022 CLINICAL DATA:  Ataxia. EXAM: CT LUMBAR SPINE WITH CONTRAST TECHNIQUE: Multidetector CT imaging of the lumbar spine was performed with intravenous contrast administration. RADIATION DOSE REDUCTION: This exam was performed according to the departmental dose-optimization program which includes automated exposure control, adjustment of the mA and/or kV according to patient size and/or use of iterative reconstruction technique. CONTRAST:  194m OMNIPAQUE IOHEXOL 300 MG/ML  SOLN COMPARISON:  MRI lumbar spine 10/22/2020 FINDINGS: Segmentation: 5 lumbar type vertebrae. Alignment: There is 8 mm of anterolisthesis at L4-L5 and 4 mm of anterolisthesis at L5-S1 which appears similar to prior. Alignment is otherwise anatomic. Vertebrae: No acute fracture or focal pathologic process. Paraspinal and other soft tissues: Negative. Left renal cysts noted. Disc levels: T12-L1, L1-L2 appear within normal limits. L2-L3: Broad-based disc bulge with bilateral facet arthropathy. No central canal or neural foraminal stenosis. L3-L4: Bilateral facet arthropathy and broad-based disc bulge. Moderate left and mild right neural  foraminal stenosis. Mild central canal stenosis. L4-L5: Bilateral facet arthropathy and uncovertebral spurring. Moderate bilateral neural foraminal stenosis. Mild spinal canal stenosis. L5-S1: Bilateral facet arthropathy and uncovertebral spurring. Moderate bilateral neural foraminal stenosis, left greater than right. IMPRESSION: 1. No acute fracture or malalignment of the lumbar spine. 2. Multilevel degenerative changes of the lumbar spine, not significantly changed compared to prior MRI. 3. Stable anterolisthesis at L4-L5 and L5-S1. Electronically Signed   By: ARonney AstersM.D.   On: 07/04/2022 15:58    Assessment/Plan 1. Dizziness - ongoing, thought to be related to meningioma per Dr. JTomi Likensand Dr. YKrista Blue- neurosurgery does not recommend further intervention - 01/08 ED visit> decadron given> prescribed prednisone taper - described "lightheadedness with position change" today - she does take metoprolol for afib - orthostatic blood pressures daily x 2 days - encourage hydration with water - cont PT/OT  2. Meningioma ([facility] - see above - Decadron given in ED - cont prednisone taper  3. Gait abnormality - frequent falls without injury x 1 month - cont PT/OT evaluation  4. Frequent falls - see above - DEXA 2023  5. Paroxysmal atrial fibrillation (HCC) - HR < 100 - cont metoprolol for rate control - cont Eliquis for clot prevention  6. Right thyroid nodule - TSH 2.99 02/11/2021 - weakness x 1 month - recent CT noted right thyroid nodule with possible enlargement> u/s recommended if clinically significant - repeat TSH     Family/ staff Communication: plan discussed with patient and nurse  Labs/tests ordered:  orthostatic blood pressures daily x 2 days, TSH 01/11

## 2022-07-06 DIAGNOSIS — R42 Dizziness and giddiness: Secondary | ICD-10-CM | POA: Diagnosis not present

## 2022-07-06 DIAGNOSIS — M6389 Disorders of muscle in diseases classified elsewhere, multiple sites: Secondary | ICD-10-CM | POA: Diagnosis not present

## 2022-07-06 DIAGNOSIS — R4189 Other symptoms and signs involving cognitive functions and awareness: Secondary | ICD-10-CM | POA: Diagnosis not present

## 2022-07-06 DIAGNOSIS — R278 Other lack of coordination: Secondary | ICD-10-CM | POA: Diagnosis not present

## 2022-07-06 DIAGNOSIS — Z9181 History of falling: Secondary | ICD-10-CM | POA: Diagnosis not present

## 2022-07-06 DIAGNOSIS — M4726 Other spondylosis with radiculopathy, lumbar region: Secondary | ICD-10-CM | POA: Diagnosis not present

## 2022-07-07 DIAGNOSIS — D329 Benign neoplasm of meninges, unspecified: Secondary | ICD-10-CM | POA: Diagnosis not present

## 2022-07-07 DIAGNOSIS — M62562 Muscle wasting and atrophy, not elsewhere classified, left lower leg: Secondary | ICD-10-CM | POA: Diagnosis not present

## 2022-07-07 DIAGNOSIS — R2681 Unsteadiness on feet: Secondary | ICD-10-CM | POA: Diagnosis not present

## 2022-07-07 DIAGNOSIS — E039 Hypothyroidism, unspecified: Secondary | ICD-10-CM | POA: Diagnosis not present

## 2022-07-07 DIAGNOSIS — M62561 Muscle wasting and atrophy, not elsewhere classified, right lower leg: Secondary | ICD-10-CM | POA: Diagnosis not present

## 2022-07-07 DIAGNOSIS — R42 Dizziness and giddiness: Secondary | ICD-10-CM | POA: Diagnosis not present

## 2022-07-07 LAB — TSH: TSH: 1.92 (ref 0.41–5.90)

## 2022-07-08 DIAGNOSIS — Z9181 History of falling: Secondary | ICD-10-CM | POA: Diagnosis not present

## 2022-07-08 DIAGNOSIS — R278 Other lack of coordination: Secondary | ICD-10-CM | POA: Diagnosis not present

## 2022-07-08 DIAGNOSIS — M6389 Disorders of muscle in diseases classified elsewhere, multiple sites: Secondary | ICD-10-CM | POA: Diagnosis not present

## 2022-07-08 DIAGNOSIS — M4726 Other spondylosis with radiculopathy, lumbar region: Secondary | ICD-10-CM | POA: Diagnosis not present

## 2022-07-08 DIAGNOSIS — R42 Dizziness and giddiness: Secondary | ICD-10-CM | POA: Diagnosis not present

## 2022-07-08 DIAGNOSIS — R4189 Other symptoms and signs involving cognitive functions and awareness: Secondary | ICD-10-CM | POA: Diagnosis not present

## 2022-07-11 DIAGNOSIS — M6389 Disorders of muscle in diseases classified elsewhere, multiple sites: Secondary | ICD-10-CM | POA: Diagnosis not present

## 2022-07-11 DIAGNOSIS — R42 Dizziness and giddiness: Secondary | ICD-10-CM | POA: Diagnosis not present

## 2022-07-11 DIAGNOSIS — Z9181 History of falling: Secondary | ICD-10-CM | POA: Diagnosis not present

## 2022-07-11 DIAGNOSIS — R4189 Other symptoms and signs involving cognitive functions and awareness: Secondary | ICD-10-CM | POA: Diagnosis not present

## 2022-07-11 DIAGNOSIS — R278 Other lack of coordination: Secondary | ICD-10-CM | POA: Diagnosis not present

## 2022-07-11 DIAGNOSIS — M4726 Other spondylosis with radiculopathy, lumbar region: Secondary | ICD-10-CM | POA: Diagnosis not present

## 2022-07-11 DIAGNOSIS — Z1231 Encounter for screening mammogram for malignant neoplasm of breast: Secondary | ICD-10-CM | POA: Diagnosis not present

## 2022-07-11 LAB — HM MAMMOGRAPHY

## 2022-07-14 DIAGNOSIS — R42 Dizziness and giddiness: Secondary | ICD-10-CM | POA: Diagnosis not present

## 2022-07-14 DIAGNOSIS — R278 Other lack of coordination: Secondary | ICD-10-CM | POA: Diagnosis not present

## 2022-07-14 DIAGNOSIS — Z9181 History of falling: Secondary | ICD-10-CM | POA: Diagnosis not present

## 2022-07-14 DIAGNOSIS — M4726 Other spondylosis with radiculopathy, lumbar region: Secondary | ICD-10-CM | POA: Diagnosis not present

## 2022-07-14 DIAGNOSIS — R4189 Other symptoms and signs involving cognitive functions and awareness: Secondary | ICD-10-CM | POA: Diagnosis not present

## 2022-07-14 DIAGNOSIS — M6389 Disorders of muscle in diseases classified elsewhere, multiple sites: Secondary | ICD-10-CM | POA: Diagnosis not present

## 2022-07-25 ENCOUNTER — Telehealth: Payer: Self-pay | Admitting: Cardiovascular Disease

## 2022-07-25 DIAGNOSIS — M6389 Disorders of muscle in diseases classified elsewhere, multiple sites: Secondary | ICD-10-CM | POA: Diagnosis not present

## 2022-07-25 DIAGNOSIS — Z9181 History of falling: Secondary | ICD-10-CM | POA: Diagnosis not present

## 2022-07-25 DIAGNOSIS — M4726 Other spondylosis with radiculopathy, lumbar region: Secondary | ICD-10-CM | POA: Diagnosis not present

## 2022-07-25 DIAGNOSIS — R4189 Other symptoms and signs involving cognitive functions and awareness: Secondary | ICD-10-CM | POA: Diagnosis not present

## 2022-07-25 DIAGNOSIS — R42 Dizziness and giddiness: Secondary | ICD-10-CM | POA: Diagnosis not present

## 2022-07-25 DIAGNOSIS — R278 Other lack of coordination: Secondary | ICD-10-CM | POA: Diagnosis not present

## 2022-07-25 NOTE — Telephone Encounter (Signed)
Returned call to patient-patient reports she had appt with Dr. Sallyanne Kuster 1/8 but had to go to the hospital and was unable to come.  She is requesting follow up with him.   Follow up scheduled and placed on cancellation list.

## 2022-07-25 NOTE — Telephone Encounter (Signed)
Pt would like a callback regarding missed appt on 07/04/22. She is insisting on sooner appt than what was offered for provider. Please advise

## 2022-07-26 ENCOUNTER — Telehealth: Payer: Self-pay | Admitting: *Deleted

## 2022-07-26 NOTE — Telephone Encounter (Signed)
Received fax from [place] and [place] 07/28/2022-06/27/2023 Reference #: [id number]

## 2022-07-26 NOTE — Telephone Encounter (Signed)
Called [employer] [phone] to initiate Prior Authorization for patient's Prolia.   Spoke with Moshe Salisbury and went through clinical questions.  Letter of Determination will be sent in 48-72 hours.   Reference #:[id number] CV:[id number]

## 2022-07-28 DIAGNOSIS — M4726 Other spondylosis with radiculopathy, lumbar region: Secondary | ICD-10-CM | POA: Diagnosis not present

## 2022-07-28 DIAGNOSIS — M6389 Disorders of muscle in diseases classified elsewhere, multiple sites: Secondary | ICD-10-CM | POA: Diagnosis not present

## 2022-07-28 DIAGNOSIS — R278 Other lack of coordination: Secondary | ICD-10-CM | POA: Diagnosis not present

## 2022-07-28 DIAGNOSIS — R4189 Other symptoms and signs involving cognitive functions and awareness: Secondary | ICD-10-CM | POA: Diagnosis not present

## 2022-07-28 DIAGNOSIS — R42 Dizziness and giddiness: Secondary | ICD-10-CM | POA: Diagnosis not present

## 2022-07-28 DIAGNOSIS — Z9181 History of falling: Secondary | ICD-10-CM | POA: Diagnosis not present

## 2022-08-01 ENCOUNTER — Ambulatory Visit: Payer: Medicare PPO

## 2022-08-01 DIAGNOSIS — M6389 Disorders of muscle in diseases classified elsewhere, multiple sites: Secondary | ICD-10-CM | POA: Diagnosis not present

## 2022-08-01 DIAGNOSIS — M8580 Other specified disorders of bone density and structure, unspecified site: Secondary | ICD-10-CM

## 2022-08-01 DIAGNOSIS — R42 Dizziness and giddiness: Secondary | ICD-10-CM | POA: Diagnosis not present

## 2022-08-01 DIAGNOSIS — R4189 Other symptoms and signs involving cognitive functions and awareness: Secondary | ICD-10-CM | POA: Diagnosis not present

## 2022-08-01 DIAGNOSIS — Z9181 History of falling: Secondary | ICD-10-CM | POA: Diagnosis not present

## 2022-08-01 DIAGNOSIS — R278 Other lack of coordination: Secondary | ICD-10-CM | POA: Diagnosis not present

## 2022-08-01 DIAGNOSIS — M4726 Other spondylosis with radiculopathy, lumbar region: Secondary | ICD-10-CM | POA: Diagnosis not present

## 2022-08-01 MED ORDER — DENOSUMAB 60 MG/ML ~~LOC~~ SOSY
60.0000 mg | PREFILLED_SYRINGE | Freq: Once | SUBCUTANEOUS | Status: AC
  Administered 2022-08-01: 60 mg via SUBCUTANEOUS

## 2022-08-04 ENCOUNTER — Encounter (HOSPITAL_COMMUNITY): Payer: Self-pay | Admitting: *Deleted

## 2022-08-04 DIAGNOSIS — I10 Essential (primary) hypertension: Secondary | ICD-10-CM | POA: Diagnosis not present

## 2022-08-04 DIAGNOSIS — I480 Paroxysmal atrial fibrillation: Secondary | ICD-10-CM | POA: Diagnosis not present

## 2022-08-04 LAB — LIPID PANEL
Cholesterol: 152 (ref 0–200)
HDL: 63 (ref 35–70)
LDL Cholesterol: 70
Triglycerides: 91 (ref 40–160)

## 2022-08-04 LAB — BASIC METABOLIC PANEL
BUN: 16 (ref 4–21)
CO2: 28 — AB (ref 13–22)
Chloride: 102 (ref 99–108)
Creatinine: 0.8 (ref 0.5–1.1)
Glucose: 119
Potassium: 4.2 mEq/L (ref 3.5–5.1)
Sodium: 143 (ref 137–147)

## 2022-08-04 LAB — CBC AND DIFFERENTIAL
HCT: 47 — AB (ref 36–46)
Hemoglobin: 15.8 (ref 12.0–16.0)
Platelets: 223 10*3/uL (ref 150–400)
WBC: 5.7

## 2022-08-04 LAB — COMPREHENSIVE METABOLIC PANEL
Calcium: 10.1 (ref 8.7–10.7)
eGFR: 67

## 2022-08-04 LAB — CBC: RBC: 5.09 (ref 3.87–5.11)

## 2022-08-05 ENCOUNTER — Other Ambulatory Visit: Payer: Self-pay | Admitting: Internal Medicine

## 2022-08-05 DIAGNOSIS — E785 Hyperlipidemia, unspecified: Secondary | ICD-10-CM

## 2022-08-09 ENCOUNTER — Encounter: Payer: Medicare PPO | Admitting: Internal Medicine

## 2022-08-10 ENCOUNTER — Encounter: Payer: Medicare PPO | Admitting: Internal Medicine

## 2022-08-15 DIAGNOSIS — R4189 Other symptoms and signs involving cognitive functions and awareness: Secondary | ICD-10-CM | POA: Diagnosis not present

## 2022-08-15 DIAGNOSIS — Z9181 History of falling: Secondary | ICD-10-CM | POA: Diagnosis not present

## 2022-08-15 DIAGNOSIS — R42 Dizziness and giddiness: Secondary | ICD-10-CM | POA: Diagnosis not present

## 2022-08-15 DIAGNOSIS — M4726 Other spondylosis with radiculopathy, lumbar region: Secondary | ICD-10-CM | POA: Diagnosis not present

## 2022-08-15 DIAGNOSIS — R278 Other lack of coordination: Secondary | ICD-10-CM | POA: Diagnosis not present

## 2022-08-15 DIAGNOSIS — M6389 Disorders of muscle in diseases classified elsewhere, multiple sites: Secondary | ICD-10-CM | POA: Diagnosis not present

## 2022-08-18 DIAGNOSIS — Z9181 History of falling: Secondary | ICD-10-CM | POA: Diagnosis not present

## 2022-08-18 DIAGNOSIS — R278 Other lack of coordination: Secondary | ICD-10-CM | POA: Diagnosis not present

## 2022-08-18 DIAGNOSIS — R42 Dizziness and giddiness: Secondary | ICD-10-CM | POA: Diagnosis not present

## 2022-08-18 DIAGNOSIS — M4726 Other spondylosis with radiculopathy, lumbar region: Secondary | ICD-10-CM | POA: Diagnosis not present

## 2022-08-18 DIAGNOSIS — R4189 Other symptoms and signs involving cognitive functions and awareness: Secondary | ICD-10-CM | POA: Diagnosis not present

## 2022-08-18 DIAGNOSIS — M6389 Disorders of muscle in diseases classified elsewhere, multiple sites: Secondary | ICD-10-CM | POA: Diagnosis not present

## 2022-08-22 ENCOUNTER — Non-Acute Institutional Stay: Payer: Medicare PPO | Admitting: Adult Health

## 2022-08-22 ENCOUNTER — Encounter: Payer: Self-pay | Admitting: Adult Health

## 2022-08-22 VITALS — BP 130/86 | HR 80 | Temp 97.6°F | Resp 17 | Ht 60.0 in | Wt 143.2 lb

## 2022-08-22 DIAGNOSIS — M79644 Pain in right finger(s): Secondary | ICD-10-CM | POA: Diagnosis not present

## 2022-08-22 DIAGNOSIS — R413 Other amnesia: Secondary | ICD-10-CM | POA: Diagnosis not present

## 2022-08-22 DIAGNOSIS — I10 Essential (primary) hypertension: Secondary | ICD-10-CM | POA: Diagnosis not present

## 2022-08-22 DIAGNOSIS — I480 Paroxysmal atrial fibrillation: Secondary | ICD-10-CM | POA: Diagnosis not present

## 2022-08-22 DIAGNOSIS — R42 Dizziness and giddiness: Secondary | ICD-10-CM | POA: Diagnosis not present

## 2022-08-22 DIAGNOSIS — E7800 Pure hypercholesterolemia, unspecified: Secondary | ICD-10-CM | POA: Diagnosis not present

## 2022-08-22 DIAGNOSIS — E041 Nontoxic single thyroid nodule: Secondary | ICD-10-CM

## 2022-08-22 DIAGNOSIS — D329 Benign neoplasm of meninges, unspecified: Secondary | ICD-10-CM

## 2022-08-22 MED ORDER — PREDNISONE 20 MG PO TABS: 20.0000 mg | ORAL_TABLET | Freq: Every day | ORAL | 0 refills | Status: DC

## 2022-08-22 NOTE — Progress Notes (Signed)
Location:  [facility]  POS: Clinic  Provider: Royal Hawthorn, ANP  Code Status: Full Goals of Care:     07/05/2022   10:24 AM  Advanced Directives  Does Patient Have a Medical Advance Directive? Yes  Type of Paramedic of [place];Living will;Out of facility DNR (pink MOST or yellow form)  Does patient want to make changes to medical advance directive? No - Patient declined  Copy of [place] in Chart? Yes - validated most recent copy scanned in chart (See row information)     Chief Complaint  Patient presents with   Medical Management of Chronic Issues    3 month follow up   Quality Metric Gaps    Discussed the need for AWV    HPI: Patient is a 89 y.o. female seen today for medical management of chronic diseases.    PMH significant for CVA, afib, HTN, gait abnormality, HLD, orthostatic hypotension, bradycardia, lumbar radiculitis, meningioma, osteopenia, and breast cancer.   Reports right middle finger is red and painful for two weeks  Recently [place] due to dizziness and memory loss Reports she doesn't like living in assisted living From [place], retired Pharmacist, hospital Likes her independence  Also reports worsening memory loss, having trouble counting backwards Did poorly in serial 7s and recall on MMSE 22/30 08/21/22 Had trouble spelling world backwards.   Had cardioversion for afib 01/11/22 Had some bradycardia which did improve off cardizem 2nd degree AV block on zio patch July 2023  Poor balance, uses walker which is chronic .   Mammogram 07/11/22 normal  Dexa scan 07/21/21 t score -1.1 on Prolia   Meningioma: MRI of the brain 11/04/21 showed stable appearance.   follows with neurosurgery  CT of heat 07/04/22 in ED 1. 3.2 x 2.3 x 3.0 cm extra-axial mass centered within the right ventrolateral aspect of the posterior fossa, slightly increased in size from the prior brain MRI of 11/04/2021 (previously 3.0 x 2.3 x 3.0  cm). This is favored to reflect a meningioma.   Afib controlled on Eliquis follows with cardiology on metoprolol  H/o left Breast Cancer Invasive Ductal Carcinoma diagnosed in 2016 S/p lumpectomy, chemo, radiation Past Medical History:  Diagnosis Date   Allergy    Ankle fracture, left    Atrial fibrillation ([place])    Per [place] New Patient Packet    Breast cancer ([place]) 1115/16   left    Bursitis of right shoulder    Cataract    Family history of cancer    Fracture of right wrist    GERD (gastroesophageal reflux disease)    Hyperlipidemia    Hypertension    Lumbar radiculitis    Per [place] New Patient Packet    Meningioma [facility])    Per [place] New Patient Packet    Skin cancer 2000   melanoma and basal cell   Stress fracture    Right Heel, Per [place] New Patient Packet    TIA (transient ischemic attack) 2022   Vaso vagal episode    during preparation for colonoscopy    Past Surgical History:  Procedure Laterality Date   ABDOMINAL HYSTERECTOMY  1988   TAH/BSO--FIBROIDS   APPENDECTOMY     BREAST LUMPECTOMY     B/L--[facility]   BREAST LUMPECTOMY WITH RADIOACTIVE SEED AND SENTINEL LYMPH NODE BIOPSY Left 06/11/2015   Procedure: LEFT BREAST LUMPECTOMY WITH RADIOACTIVE SEED AND LEFT SENTINEL LYMPH NODE MAPPING;  Surgeon: Erroll Luna, MD;  Location: [facility];  Service: General;  Laterality: Left;   CARDIOVERSION N/A 01/11/2022   Procedure: CARDIOVERSION;  Surgeon: Buford Dresser, MD;  Location: [facility] ENDOSCOPY;  Service: Cardiovascular;  Laterality: N/A;   CATARACT EXTRACTION  2015   COLONOSCOPY  2010   DG  BONE DENSITY ([place] [facility])     EYE SURGERY Bilateral    cataracts   fiberadenoma Bilateral 1978, 1980   GANGLION CYST EXCISION     L  hand   Lipiflow procedure      Allergies  Allergen Reactions   Sulfa Antibiotics Other (See Comments)    As a child, rigid as a stick and not responsive   Tape Other (See Comments)    Blisters, Please use "paper" tape only for short periods of time    Glucosamine Forte [Nutritional Supplements] Rash   Latex Rash    Outpatient Encounter Medications as of 08/22/2022  Medication Sig   acetaminophen (TYLENOL) 325 MG tablet Take 325 mg by mouth every 6 (six) hours as needed for moderate pain.   Biotin 5 MG CAPS Take 5 mg by mouth daily.    Calcium Carbonate-Vitamin D 600-400 MG-UNIT tablet Take 1 tablet by mouth daily.   Cholecalciferol (VITAMIN D3) 1000 UNITS CAPS Take 1,000 Units by mouth daily.   cycloSPORINE (RESTASIS) 0.05 % ophthalmic emulsion Place 1 drop into both eyes 2 (two) times daily.   ELIQUIS 5 MG TABS tablet TAKE ONE TABLET BY MOUTH TWICE DAILY   metoprolol succinate (TOPROL XL) 25 MG 24 hr tablet Take 25 mg every morning   Multiple Vitamins-Minerals (PRESERVISION AREDS) TABS Take 2 tablets by mouth daily.   Omega-3 Fatty Acids (FISH OIL PO) Take 1 Capful by mouth daily.   polyethylene glycol (MIRALAX / GLYCOLAX) 17 g packet Take 17 g by mouth daily as needed.   pravastatin (PRAVACHOL) 40 MG tablet TAKE ONE TABLET BY MOUTH ONCE DAILY   predniSONE (STERAPRED UNI-PAK 21 TAB) 10 MG (21) TBPK tablet Take by mouth daily. Take 6 tabs by mouth daily  for 2 days, then 5 tabs for 2 days, then 4 tabs for 2 days, then 3 tabs for 2 days, 2 tabs for 2 days, then 1 tab by mouth daily for 2 days   Psyllium (METAMUCIL PO) Take 1 Dose by mouth daily. 1 teaspoon   Turmeric 500 MG CAPS Take 1 capsule by mouth daily.   vitamin B-12 (CYANOCOBALAMIN) 1000 MCG tablet Take 1 tablet (1,000 mcg total) by mouth daily.   [DISCONTINUED] AMBULATORY NON FORMULARY MEDICATION Walker use daily as directed.  Disp 1 Leg pain M79.604   [DISCONTINUED] pravastatin (PRAVACHOL) 40 MG tablet TAKE ONE TABLET BY MOUTH ONCE DAILY   No facility-administered encounter medications on file as of 08/22/2022.    Review of Systems:  Review of Systems  Constitutional:  Negative for activity change, appetite change, chills, diaphoresis, fatigue, fever and unexpected weight  change.  HENT:  Negative for congestion.   Respiratory:  Negative for cough, shortness of breath and wheezing.   Cardiovascular:  Negative for chest pain, palpitations and leg swelling.  Gastrointestinal:  Negative for abdominal distention, abdominal pain, constipation and diarrhea.  Genitourinary:  Negative for difficulty urinating and dysuria.  Musculoskeletal:  Positive for arthralgias, gait problem and joint swelling. Negative for back pain and myalgias.  Neurological:  Positive for dizziness. Negative for tremors, seizures, syncope, facial asymmetry, speech difficulty, weakness, light-headedness, numbness and headaches.  Psychiatric/Behavioral:  Negative for agitation, behavioral problems and confusion.  Memory loss    Health Maintenance  Topic Date Due   Medicare Annual Wellness (AWV)  11/13/2020   COVID-19 Vaccine (6 - 2023-24 season) 02/15/2023 (Originally 06/22/2022)   MAMMOGRAM  07/12/2023   DTaP/Tdap/Td (5 - Td or Tdap) 10/24/2030   Pneumonia Vaccine 85+ Years old  Completed   INFLUENZA VACCINE  Completed   DEXA SCAN  Completed   Zoster Vaccines- Shingrix  Completed   HPV VACCINES  Aged Out    Physical Exam: There were no vitals filed for this visit. There is no height or weight on file to calculate BMI. Physical Exam Vitals and nursing note reviewed.  Constitutional:      General: She is not in acute distress.    Appearance: She is not diaphoretic.  HENT:     Head: Normocephalic and atraumatic.     Right Ear: Tympanic membrane normal.     Left Ear: Tympanic membrane normal.     Nose: Nose normal.     Mouth/Throat:     Mouth: Mucous membranes are moist.     Pharynx: Oropharynx is clear.  Eyes:     Extraocular Movements: Extraocular movements intact.     Conjunctiva/sclera: Conjunctivae normal.     Pupils: Pupils are equal, round, and reactive to light.  Neck:     Vascular: No JVD.  Cardiovascular:     Rate and Rhythm: Normal rate. Rhythm irregular.      Heart sounds: No murmur heard. Pulmonary:     Effort: Pulmonary effort is normal. No respiratory distress.     Breath sounds: Normal breath sounds. No wheezing.  Abdominal:     General: Bowel sounds are normal. There is no distension.     Palpations: Abdomen is soft.  Musculoskeletal:     Comments: Right middle finger DIP joint with pain and redness and swelling.  Trace ankle edema  Skin:    General: Skin is warm and dry.  Neurological:     General: No focal deficit present.     Mental Status: She is alert and oriented to person, place, and time. Mental status is at baseline.  Psychiatric:        Mood and Affect: Mood normal.     Labs reviewed: Basic Metabolic Panel: Recent Labs    11/04/21 1144 01/06/22 1026 04/21/22 0000 07/04/22 1219 08/04/22 0000  NA 141 142 144 139 143  K 4.6 4.7 4.1 3.9 4.2  CL 105 103 106 103 102  CO2 30 28 27* 29 28*  GLUCOSE 96 87  --  99  --   BUN '17 16 16 14 16  '$ CREATININE 0.71 0.77 0.8 0.80 0.8  CALCIUM 9.9 10.1 9.8 9.3 10.1   Liver Function Tests: Recent Labs    11/04/21 1144 02/10/22 0000 04/21/22 0000 07/04/22 1219  AST 18 37* 25 31  ALT 21 49* 31 53*  ALKPHOS 49 65 68 69  BILITOT 0.5  --   --  1.2  PROT 6.2*  --   --  6.1*  ALBUMIN 4.4 4.3 4.0 3.8   No results for input(s): "LIPASE", "AMYLASE" in the last 8760 hours. No results for input(s): "AMMONIA" in the last 8760 hours. CBC: Recent Labs    11/04/21 1144 01/06/22 1026 04/21/22 0000 07/04/22 1219 08/04/22 0000  WBC 6.8 7.6 7.2 8.0 5.7  NEUTROABS 4.5  --   --   --   --   HGB 14.6 14.8 15.4 15.9* 15.8  HCT 43.8 44.5 46 47.0* 47*  MCV 91.8 92  --  91.1  --   PLT 205 226 193 198 223   Lipid Panel: Recent Labs    02/10/22 0000 04/21/22 0000 08/04/22 0000  CHOL 142 122 152  HDL 62 58 63  LDLCALC 64 50 70  TRIG 81 66 91   Lab Results  Component Value Date   HGBA1C 4.8 05/11/2021    Procedures since last visit: No results found.  Assessment/Plan  1.  Pain of finger of right hand Note to DIP joint  Prednisone 20 mg daily for 5 days Monitor report if not improving.   2. Memory loss MMSE 22/30 Issues of recall and serial 7s Will order ST eval for cognition   3. Dizziness Improving Needs walker at all times BP ok Like due to meningioma per notes   4. Meningioma (HCC) Slight increase in size possibly pushing on brain stem per notes from Dr Krista Blue Not a surgical candidate   5. Paroxysmal atrial fibrillation (HCC) Rate is controlled on metoprolol  ON DOAC for CVA risk reduction   6. Right thyroid nodule TSH ok Found on CT incidentally Will hold off on imaging for right now.   7. Essential hypertension Controlled   8. Pure hypercholesterolemia Lab Results  Component Value Date   LDLCALC 70 08/04/2022   Continue pravachol     Labs/tests ordered:  * No order type specified * BMP prior to apt Next appt: 4 months with Dr Lyndel Safe    Total time 35mn   time greater than 50% of total time spent doing pt counseling and coordination of care

## 2022-08-23 DIAGNOSIS — R278 Other lack of coordination: Secondary | ICD-10-CM | POA: Diagnosis not present

## 2022-08-23 DIAGNOSIS — Z9181 History of falling: Secondary | ICD-10-CM | POA: Diagnosis not present

## 2022-08-23 DIAGNOSIS — R42 Dizziness and giddiness: Secondary | ICD-10-CM | POA: Diagnosis not present

## 2022-08-23 DIAGNOSIS — M6389 Disorders of muscle in diseases classified elsewhere, multiple sites: Secondary | ICD-10-CM | POA: Diagnosis not present

## 2022-08-23 DIAGNOSIS — M4726 Other spondylosis with radiculopathy, lumbar region: Secondary | ICD-10-CM | POA: Diagnosis not present

## 2022-08-23 DIAGNOSIS — R4189 Other symptoms and signs involving cognitive functions and awareness: Secondary | ICD-10-CM | POA: Diagnosis not present

## 2022-08-24 ENCOUNTER — Encounter: Payer: Self-pay | Admitting: Cardiovascular Disease

## 2022-08-24 ENCOUNTER — Ambulatory Visit: Payer: Medicare PPO | Attending: Cardiovascular Disease | Admitting: Cardiovascular Disease

## 2022-08-24 VITALS — BP 140/88 | HR 88 | Ht 60.0 in | Wt 143.6 lb

## 2022-08-24 DIAGNOSIS — I495 Sick sinus syndrome: Secondary | ICD-10-CM | POA: Diagnosis not present

## 2022-08-24 DIAGNOSIS — E7800 Pure hypercholesterolemia, unspecified: Secondary | ICD-10-CM | POA: Diagnosis not present

## 2022-08-24 DIAGNOSIS — R55 Syncope and collapse: Secondary | ICD-10-CM | POA: Diagnosis not present

## 2022-08-24 DIAGNOSIS — I10 Essential (primary) hypertension: Secondary | ICD-10-CM | POA: Diagnosis not present

## 2022-08-24 DIAGNOSIS — I4819 Other persistent atrial fibrillation: Secondary | ICD-10-CM

## 2022-08-24 DIAGNOSIS — Z8673 Personal history of transient ischemic attack (TIA), and cerebral infarction without residual deficits: Secondary | ICD-10-CM | POA: Diagnosis not present

## 2022-08-24 DIAGNOSIS — D6869 Other thrombophilia: Secondary | ICD-10-CM

## 2022-08-24 NOTE — Progress Notes (Signed)
Cardiology Office Note:    Date:  08/24/2022   ID:  Jessica Mullins, DOB 09-06-1930, MRN [id number]  PCP:  Virgie Dad, MD  [facility] Cardiologist:  Sanda Klein, MD  [place] Electrophysiologist:  None   Referring MD: Virgie Dad, MD   No chief complaint on file.    History of Present Illness:    Jessica Mullins is a 89 y.o. female with a hx of paroxysmal atrial fibrillation without significant structural heart disease, more recently with persistent atrial fibrillation, remote history of vasovagal syncope, history of stroke 2022, cerebral meningioma.  She is doing quite well although she is a little perturbed by her recent transition from independent living to assisted living.  She wonders whether she has made the right decision.  Still adjusting to the additional loss and her autonomy.  She has not been troubled by palpitations.  She rarely has dizziness.  She has not had any falls.  She carefully uses a walker.  She denies bleeding issues.  She has not had chest pain or shortness of breath at rest or with usual activity.  ECG today shows atrial fibrillation with controlled ventricular response.  At her last appointment we discussed options for conservative management (continue anticoagulation, no need for rate control medications at this time) versus antiarrhythmic therapy with dofetilide (which requires hospitalization) or amiodarone (which has a much higher side effect profile and which would guarantee that she needs a pacemaker sooner.  Also discussed the fact that it is very likely that a pacemaker will be necessary at some point in the future due to evidence of AV node conduction disease (normal ventricular rates during atrial fibrillation without medications).   In November 2022 she developed problems with visual changes eventually with severe diplopia.  MRI of the brain showed a small punctate acute left parietal infarction, not an area that would be  expected to contribute to visual changes.  CT angiogram of the head and neck did not show any large vessel stenoses and there was mild for age atherosclerosis in the intracranial vessels without significant stenoses.  The echocardiogram showed unchanged findings with normal LV systolic function LA dilated left atrium in the absence of hemodynamically significant valvular abnormalities (significant mitral annular calcification with caseous change reported).  Her symptoms have resolved.  She was seen in the emergency room for problems with her balance in early May.  She was in sinus bradycardia at 51 bpm.  On Miyah 20 she had dizziness and blurry vision with a heart rate reportedly of only 39 bpm.  She complains of fatigue and poor balance.  We stopped her diltiazem.  The event monitor showed persistent atrial fibrillation.  She was still taking flecainide and we set her up for cardioversion which was successfully performed on 01/11/2022.  She presents today in atrial fibrillation with controlled ventricular response again.  She remains arrhythmia unaware so she cannot tell us when the arrhythmia recurred.  She did not feel any improvement following her cardioversion.  Her previous event monitor October 2021 did show a tendency to bradycardia with heart rates frequently in the 50s and borderline criteria for chronotropic incompetence.  Her average heart rate was 64.  The maximum achieved heart rate and sinus rhythm was 103 (75% of predicted for age).  Repeat event monitor performed in late 2023 showed persistent atrial fibrillation with periods of RVR, but no significant bradycardia.    Past Medical History:  Diagnosis Date   Allergy  Ankle fracture, left    Atrial fibrillation ([place])    Per [place] New Patient Packet    Breast cancer ([place]) 1115/16   left    Bursitis of right shoulder    Cataract    Family history of cancer    Fracture of right wrist    GERD (gastroesophageal reflux disease)     Hyperlipidemia    Hypertension    Lumbar radiculitis    Per [place] New Patient Packet    [facility]    Per [place] New Patient Packet    Skin cancer 2000   melanoma and basal cell   Stress fracture    Right Heel, Per [place] New Patient Packet    TIA (transient ischemic attack) 2022   Vaso vagal episode    during preparation for colonoscopy    Past Surgical History:  Procedure Laterality Date   ABDOMINAL HYSTERECTOMY  1988   TAH/BSO--FIBROIDS   APPENDECTOMY     BREAST LUMPECTOMY     B/L--FCS   BREAST LUMPECTOMY WITH RADIOACTIVE SEED AND SENTINEL LYMPH NODE BIOPSY Left 06/11/2015   Procedure: LEFT BREAST LUMPECTOMY WITH RADIOACTIVE SEED AND LEFT SENTINEL LYMPH NODE MAPPING;  Surgeon: Erroll Luna, MD;  Location: [facility];  Service: General;  Laterality: Left;   CARDIOVERSION N/A 01/11/2022   Procedure: CARDIOVERSION;  Surgeon: Buford Dresser, MD;  Location: [facility];  Service: Cardiovascular;  Laterality: N/A;   CATARACT EXTRACTION  2015   COLONOSCOPY  2010   DG  BONE DENSITY ([place] [facility])     EYE SURGERY Bilateral    cataracts   fiberadenoma Bilateral 1978, 1980   GANGLION CYST EXCISION     L  hand   Lipiflow procedure      Current Medications: No outpatient medications have been marked as taking for the 08/24/22 encounter (Office Visit) with Sanda Klein, MD.     Allergies:   Sulfa antibiotics, Tape, Wound dressing adhesive, Glucosamine forte [nutritional supplements], and Latex   Social History   Socioeconomic History   Marital status: Widowed    Spouse name: Not on file   Number of children: 2   Years of education: Not on file   Highest education level: Master's degree (e.g., MA, MS, MEng, MEd, [name], [name])  Occupational History   Occupation: Product manager: RETIRED    Comment: retired  Tobacco Use   Smoking status: Never   Smokeless tobacco: Never  Vaping Use   Vaping Use: Never used  Substance and Sexual Activity   Alcohol use: Yes    Comment:  rare   Drug use: No   Sexual activity: Never  Other Topics Concern   Not on file  Social History Narrative   Patient is widowed.  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    One son one daughter   1 caffeinated beverage daily   She is right-handed.    She works out everyday to a televised [employer] 30 minute program.   No tobacco or alcohol         Per [place] New Patient Packet Abstracted on 01/03/2020      Diet: Left blank       Caffeine: Yes      Married, if yes what year: Widowed, married in [place] you live in a house, apartment, assisted living, condo, trailer, ect: Apartment      Is it one or more stories: One stories, one person  Pets: 1 cat      Current/Past profession: Teacher      Highest level or education completed: MA of Education       Exercise:   Yes             Type and how often: Cardio, chair aerobics, 3-4 times weekly          Living Will: Yes   DNR: Yes   POA/HPOA: Yes      Functional Status:   Do you have difficulty bathing or dressing yourself? Left blank   Do you have difficulty preparing food or eating? Left blank   Do you have difficulty managing your medications? Left blank   Do you have difficulty managing your finances? Left blank   Do you have difficulty affording your medications? Left blank   Social Determinants of Health   Financial Resource Strain: Low Risk  (11/14/2019)   Overall Financial Resource Strain (CARDIA)    Difficulty of Paying Living Expenses: Not hard at all  Food Insecurity: No Food Insecurity (11/14/2019)   Hunger Vital Sign    Worried About Running Out of Food in the Last Year: Never true    Ran Out of Food in the Last Year: Never true  Transportation Needs: No Transportation Needs (11/14/2019)   PRAPARE - Hydrologist (Medical): No    Lack of Transportation (Non-Medical): No  Physical Activity: Not on file  Stress: Not on file  Social Connections: Not on file      Family History: The patient's family history includes Cancer in her mother; Coronary artery disease in her brother; Diabetes in her brother and maternal aunt; Heart disease in her brother; Heart disease (age of onset: 73) in her father; High Cholesterol in her son; Hyperlipidemia in her brother; Hypertension in her brother and son; Lung cancer in an other family member; Lung cancer (age of onset: 62) in her sister; Thrombosis in an other family member.  ROS:   Please see the history of present illness.     All other systems reviewed and are negative.  EKGs/Labs/Other Studies Reviewed:    The following studies were reviewed today: Event monitor 06/06/2022:   Dominant rhythm is atrial fibrillation with mostly well controlled ventricular response at rest, but with frequent mild rapid ventricular response during periods of activity.   No severe bradycardia is seen.   There are rare PVCs and a single 4 beat episode of nonsustained VT.   Abnormal arrhythmia monitor due to persistent atrial fibrillation, often with rapid ventricular response during activity, but well rate controlled at rest..  ECHO 05/11/2021   1. A small pericardial effusion is present. The pericardial effusion is  anterior to the right ventricle. There is no RV or RA collapse suggestive  of increased pericardial pressures.   2. The inferior vena cava is normal in size with greater than 50%  respiratory variability, suggesting right atrial pressure of 3 mmHg.   3. Caseous mitral annular calcifification noted. The mitral valve is  abnormal. No evidence of mitral valve regurgitation. No evidence of mitral  stenosis.   4. Left ventricular ejection fraction, by estimation, is 55 to 60%. The  left ventricle has normal function. The left ventricle has no regional  wall motion abnormalities. There is mild concentric left ventricular  hypertrophy. Left ventricular diastolic  parameters are consistent with Grade II diastolic  dysfunction  (pseudonormalization).   5. Right ventricular systolic function is normal. The right ventricular  size is normal. There is normal pulmonary artery systolic pressure.   6. Left atrial size was mildly dilated.   7. The aortic valve was not well visualized. Aortic valve regurgitation  is not visualized. No aortic stenosis is present.   Comparison(s): Prior images unable to be directly viewed, comparison made  by report only. Similar from prior report.   EKG:  EKG is ordered today.  It shows atrial fibrillation with borderline ventricular rate control at 101 bpm, QS pattern in leads V1-V2, QTc 464 ms (hard to measure with accuracy due to the arrhythmia).  Recent Labs: 07/04/2022: ALT 53 07/07/2022: TSH 1.92 08/04/2022: BUN 16; Creatinine 0.8; Hemoglobin 15.8; Platelets 223; Potassium 4.2; Sodium 143  Recent Lipid Panel    Component Value Date/Time   CHOL 152 08/04/2022 0000   TRIG 91 08/04/2022 0000   HDL 63 08/04/2022 0000   CHOLHDL 2.5 05/11/2021 0128   VLDL 17 05/11/2021 0128   LDLCALC 70 08/04/2022 0000   LDLDIRECT 153.4 11/16/2009 0000    Physical Exam:    VS:  BP (!) 140/88 (BP Location: Left Arm, Patient Position: Sitting, Cuff Size: Normal)   Pulse 88   Ht 5' (1.524 m)   Wt 143 lb 9.6 oz (65.1 kg)   SpO2 96%   BMI 28.04 kg/m     Wt Readings from Last 3 Encounters:  08/24/22 143 lb 9.6 oz (65.1 kg)  08/22/22 143 lb 3.2 oz (65 kg)  07/05/22 136 lb 12.8 oz (62.1 kg)      General: Alert, oriented x3, no distress, overweight Head: no evidence of trauma, PERRL, EOMI, no exophtalmos or lid lag, no myxedema, no xanthelasma; normal ears, nose and oropharynx Neck: normal jugular venous pulsations and no hepatojugular reflux; brisk carotid pulses without delay and no carotid bruits Chest: clear to auscultation, no signs of consolidation by percussion or palpation, normal fremitus, symmetrical and full respiratory excursions Cardiovascular: normal position and  quality of the apical impulse, irregular rhythm, normal first and second heart sounds, no murmurs, rubs or gallops Abdomen: no tenderness or distention, no masses by palpation, no abnormal pulsatility or arterial bruits, normal bowel sounds, no hepatosplenomegaly Extremities: no clubbing, cyanosis or edema; 2+ radial, ulnar and brachial pulses bilaterally; 2+ right femoral, posterior tibial and dorsalis pedis pulses; 2+ left femoral, posterior tibial and dorsalis pedis pulses; no subclavian or femoral bruits Neurological: grossly nonfocal Psych: Normal mood and affect   ASSESSMENT:    1. Persistent atrial fibrillation ([place])   2. Near syncope   3. SSS (sick sinus syndrome) ([place])   4. Acquired thrombophilia ([place])   5. Essential hypertension   6. History of ischemic stroke   7. Hypercholesterolemia       PLAN:    In order of problems listed above:  AFib: This is not really a symptomatic arrhythmia.  Adequate rate control on a low-dose of metoprolol.  Preserved left ventricular systolic function.  Aggressively pursuing normal rhythm will likely lead to need for pacemaker implantation and other complications.  I think she would be best served by managing this as permanent, with rate control.  On appropriate anticoagulation.   Near syncope: No new events.  She did not have any meaningful bradycardia while wearing the most recent arrhythmia monitor. SSS/AV conduction abnormalities: When she was in sinus rhythm we had to stop all AV nodal blocking agents due to bradycardia.  Currently requiring a very low-dose of metoprolol to achieve good rate control. Anticoagulation: No bleeding problems. She is relatively  small in body size and it is possible that in the future we will have to decrease her dose of Eliquis to 2.5 mg twice daily if her weight decreases to less than 60 kg. HTN: Adequately controlled.  She has a history of orthostatic hypotension.  Tolerate systolic blood pressure around  140-150. History of CVA: Incidentally seen on CT and not correlating with symptoms and not in a pattern typical for embolic events  HLP: She is back on low-dose pravastatin and her LDL cholesterol is excellent at 70.  Holding the statin did not lead to any improvement in her leg weakness.  Medication Adjustments/Labs and Tests Ordered: Current medicines are reviewed at length with the patient today.  Concerns regarding medicines are outlined above.  No orders of the defined types were placed in this encounter.   No orders of the defined types were placed in this encounter.    Patient Instructions  Medication Instructions:   No changes  *If you need a refill on your cardiac medications before your next appointment, please call your pharmacy*   Lab Work:  Not needed   Testing/Procedures:  Not needed  Follow-Up: At [facility], you and your health needs are our priority.  As part of our continuing mission to provide you with exceptional heart care, we have created designated Provider Care Teams.  These Care Teams include your primary Cardiologist (physician) and Advanced Practice Providers (APPs -  Physician Assistants and Nurse Practitioners) who all work together to provide you with the care you need, when you need it.     Your next appointment:   12 month(s)  The format for your next appointment:   In Person  Provider:   Sanda Klein, MD    Signed, Sanda Klein, MD  08/24/2022 2:24 PM    [place]

## 2022-08-24 NOTE — Patient Instructions (Signed)
Medication Instructions:   No changes  *If you need a refill on your cardiac medications before your next appointment, please call your pharmacy*   Lab Work: Not needed    Testing/Procedures:  Not  needed  Follow-Up: At [facility], you and your health needs are our priority.  As part of our continuing mission to provide you with exceptional heart care, we have created designated Provider Care Teams.  These Care Teams include your primary Cardiologist (physician) and Advanced Practice Providers (APPs -  Physician Assistants and Nurse Practitioners) who all work together to provide you with the care you need, when you need it.     Your next appointment:   12 month(s)  The format for your next appointment:   In Person  Provider:   Mihai Croitoru, MD    

## 2022-09-05 DIAGNOSIS — M6389 Disorders of muscle in diseases classified elsewhere, multiple sites: Secondary | ICD-10-CM | POA: Diagnosis not present

## 2022-09-05 DIAGNOSIS — R278 Other lack of coordination: Secondary | ICD-10-CM | POA: Diagnosis not present

## 2022-09-05 DIAGNOSIS — Z9181 History of falling: Secondary | ICD-10-CM | POA: Diagnosis not present

## 2022-09-05 DIAGNOSIS — R4189 Other symptoms and signs involving cognitive functions and awareness: Secondary | ICD-10-CM | POA: Diagnosis not present

## 2022-09-05 DIAGNOSIS — R42 Dizziness and giddiness: Secondary | ICD-10-CM | POA: Diagnosis not present

## 2022-09-05 DIAGNOSIS — M4726 Other spondylosis with radiculopathy, lumbar region: Secondary | ICD-10-CM | POA: Diagnosis not present

## 2022-09-06 DIAGNOSIS — R4189 Other symptoms and signs involving cognitive functions and awareness: Secondary | ICD-10-CM | POA: Diagnosis not present

## 2022-09-06 DIAGNOSIS — R41841 Cognitive communication deficit: Secondary | ICD-10-CM | POA: Diagnosis not present

## 2022-09-06 DIAGNOSIS — R413 Other amnesia: Secondary | ICD-10-CM | POA: Diagnosis not present

## 2022-09-15 DIAGNOSIS — R413 Other amnesia: Secondary | ICD-10-CM | POA: Diagnosis not present

## 2022-09-15 DIAGNOSIS — R41841 Cognitive communication deficit: Secondary | ICD-10-CM | POA: Diagnosis not present

## 2022-09-15 DIAGNOSIS — R4189 Other symptoms and signs involving cognitive functions and awareness: Secondary | ICD-10-CM | POA: Diagnosis not present

## 2022-09-20 ENCOUNTER — Encounter: Payer: Self-pay | Admitting: Internal Medicine

## 2022-09-20 ENCOUNTER — Non-Acute Institutional Stay: Payer: Medicare PPO | Admitting: Internal Medicine

## 2022-09-20 VITALS — BP 132/84 | HR 83 | Temp 97.9°F | Resp 16 | Ht 60.0 in | Wt 141.6 lb

## 2022-09-20 DIAGNOSIS — I10 Essential (primary) hypertension: Secondary | ICD-10-CM

## 2022-09-20 DIAGNOSIS — R42 Dizziness and giddiness: Secondary | ICD-10-CM

## 2022-09-20 DIAGNOSIS — I480 Paroxysmal atrial fibrillation: Secondary | ICD-10-CM | POA: Diagnosis not present

## 2022-09-20 DIAGNOSIS — R413 Other amnesia: Secondary | ICD-10-CM | POA: Diagnosis not present

## 2022-09-20 DIAGNOSIS — K5901 Slow transit constipation: Secondary | ICD-10-CM | POA: Diagnosis not present

## 2022-09-20 DIAGNOSIS — D329 Benign neoplasm of meninges, unspecified: Secondary | ICD-10-CM | POA: Diagnosis not present

## 2022-09-20 DIAGNOSIS — R21 Rash and other nonspecific skin eruption: Secondary | ICD-10-CM

## 2022-09-20 NOTE — Progress Notes (Unsigned)
Location: [facility] of Service:  Clinic (12)  Provider:   Code Status: DNR Goals of Care:     09/20/2022    9:17 AM  Advanced Directives  Does Patient Have a Medical Advance Directive? Yes  Type of Paramedic of [place];Living will  Does patient want to make changes to medical advance directive? No - Patient declined  Copy of [place] in Chart? Yes - validated most recent copy scanned in chart (See row information)     Chief Complaint  Patient presents with   Acute Visit    Patient states she has some concerns and pain in her whole body    HPI: Patient is a 89 y.o. female seen today for an acute visit for Pain in her rectal area, Constipation Possible Hemorrhoids  Lives in [place] In [place]  C/o Constipation With Abdominal Discomfort Pain in Perirectal area and Possible hemorrhoid flare up No Nausea No Vomiting  Her Dizziness is better now No Recent falls Back pain better with tylenol  H/o  Dizziness and unsteadiness Has seen Dr Tomi Likens and Dr Krista Blue now and They both think it is due to her Meningoma which is increasing the pressure on her Brain stem  H/o PAF H/o Low Back Pain with Radiculopathy follows with Neurology H/o Breast Cancer Invasive Ductal Carcinoma diagnosed in 2016 Osteopenia Last DEXA was 03/23   Past Medical History:  Diagnosis Date   Allergy    Ankle fracture, left    Atrial fibrillation ([place])    Per [facility] New Patient Packet    Breast cancer ([place]) 1115/16   left    Bursitis of right shoulder    Cataract    Family history of cancer    Fracture of right wrist    GERD (gastroesophageal reflux disease)    Hyperlipidemia    Hypertension    Lumbar radiculitis    Per [place] New Patient Packet    Meningioma [facility])    Per [place] New Patient Packet    Skin cancer 2000   melanoma and basal cell   Stress fracture    Right Heel, Per [place] New Patient Packet    TIA (transient ischemic attack)  2022   Vaso vagal episode    during preparation for colonoscopy    Past Surgical History:  Procedure Laterality Date   ABDOMINAL HYSTERECTOMY  1988   TAH/BSO--FIBROIDS   APPENDECTOMY     BREAST LUMPECTOMY     B/L--[facility]   BREAST LUMPECTOMY WITH RADIOACTIVE SEED AND SENTINEL LYMPH NODE BIOPSY Left 06/11/2015   Procedure: LEFT BREAST LUMPECTOMY WITH RADIOACTIVE SEED AND LEFT SENTINEL [place];  Surgeon: Erroll Luna, MD;  Location: [facility];  Service: General;  Laterality: Left;   CARDIOVERSION N/A 01/11/2022   Procedure: CARDIOVERSION;  Surgeon: Buford Dresser, MD;  Location: [facility];  Service: Cardiovascular;  Laterality: N/A;   CATARACT EXTRACTION  2015   COLONOSCOPY  2010   DG  BONE DENSITY ([place] HX)     EYE SURGERY Bilateral    cataracts   fiberadenoma Bilateral 1978, 1980   GANGLION CYST EXCISION     L  hand   Lipiflow procedure      Allergies  Allergen Reactions   Sulfa Antibiotics Other (See Comments)    As a child, rigid as a stick and not responsive   Tape Other (See Comments)    Blisters, Please use "paper" tape only for short periods of time  Wound Dressing Adhesive Other (See Comments)    Blisters, Please use "paper" tape only for short periods of time, Blisters, Please use "paper" tape only for short periods of time   Glucosamine Forte [Nutritional Supplements] Rash   Latex Rash    Outpatient Encounter Medications as of 09/20/2022  Medication Sig   acetaminophen (TYLENOL) 325 MG tablet Take 325 mg by mouth every 6 (six) hours as needed for moderate pain.   Biotin 5 MG CAPS Take 5 mg by mouth daily.    Calcium Carbonate-Vitamin D 600-400 MG-UNIT tablet Take 1 tablet by mouth daily.   Cholecalciferol (VITAMIN D3) 1000 UNITS CAPS Take 1,000 Units by mouth daily.   cycloSPORINE (RESTASIS) 0.05 % ophthalmic emulsion Place 1 drop into both eyes 2 (two) times daily.   ELIQUIS 5 MG TABS tablet TAKE ONE TABLET BY MOUTH TWICE DAILY   metoprolol  succinate (TOPROL XL) 25 MG 24 hr tablet Take 25 mg every morning   Multiple Vitamins-Minerals (PRESERVISION AREDS) TABS Take 2 tablets by mouth daily.   Omega-3 Fatty Acids (FISH OIL PO) Take 1 Capful by mouth daily.   polyethylene glycol (MIRALAX / GLYCOLAX) 17 g packet Take 17 g by mouth daily as needed.   pravastatin (PRAVACHOL) 40 MG tablet TAKE ONE TABLET BY MOUTH ONCE DAILY   Psyllium (METAMUCIL PO) Take 1 Dose by mouth daily. 1 teaspoon   Turmeric 500 MG CAPS Take 1 capsule by mouth daily.   vitamin B-12 (CYANOCOBALAMIN) 1000 MCG tablet Take 1 tablet (1,000 mcg total) by mouth daily.   [DISCONTINUED] predniSONE (DELTASONE) 20 MG tablet Take 1 tablet (20 mg total) by mouth daily with breakfast. (Patient not taking: Reported on 09/20/2022)   No facility-administered encounter medications on file as of 09/20/2022.    Review of Systems:  Review of Systems  Constitutional:  Negative for activity change and appetite change.  HENT: Negative.    Respiratory:  Negative for cough and shortness of breath.   Cardiovascular:  Negative for leg swelling.  Gastrointestinal:  Positive for abdominal distention, constipation and rectal pain.  Genitourinary: Negative.   Musculoskeletal:  Positive for gait problem. Negative for arthralgias and myalgias.  Skin: Negative.   Neurological:  Positive for dizziness and weakness.  Psychiatric/Behavioral:  Negative for confusion, dysphoric mood and sleep disturbance.     Health Maintenance  Topic Date Due   Medicare Annual Wellness (AWV)  10/04/2022 (Originally 11/13/2020)   COVID-19 Vaccine (6 - 2023-24 season) 02/15/2023 (Originally 06/22/2022)   MAMMOGRAM  07/12/2023   DTaP/Tdap/Td (5 - Td or Tdap) 10/24/2030   Pneumonia Vaccine 85+ Years old  Completed   INFLUENZA VACCINE  Completed   DEXA SCAN  Completed   Zoster Vaccines- Shingrix  Completed   HPV VACCINES  Aged Out    Physical Exam: Vitals:   09/20/22 1014  BP: 132/84  Pulse: 83  Resp: 16   Temp: 97.9 F (36.6 C)  TempSrc: Temporal  SpO2: 99%  Weight: 141 lb 9.6 oz (64.2 kg)  Height: 5' (1.524 m)   Body mass index is 27.65 kg/m. Physical Exam Vitals reviewed.  Constitutional:      Appearance: Normal appearance.  HENT:     Head: Normocephalic.     Nose: Nose normal.     Mouth/Throat:     Mouth: Mucous membranes are moist.     Pharynx: Oropharynx is clear.  Eyes:     Pupils: Pupils are equal, round, and reactive to light.  Cardiovascular:  Rate and Rhythm: Normal rate and regular rhythm.     Pulses: Normal pulses.     Heart sounds: Normal heart sounds. No murmur heard. Pulmonary:     Effort: Pulmonary effort is normal.     Breath sounds: Normal breath sounds.  Abdominal:     General: Abdomen is flat. Bowel sounds are normal.     Palpations: Abdomen is soft.  Genitourinary:    Comments: Rash in Perirectal area 2 Hemorrhoids which are not swollen but tender per patient Musculoskeletal:        General: No swelling.     Cervical back: Neck supple.  Skin:    General: Skin is warm.  Neurological:     General: No focal deficit present.     Mental Status: She is alert and oriented to person, place, and time.  Psychiatric:        Mood and Affect: Mood normal.        Thought Content: Thought content normal.     Labs reviewed: Basic Metabolic Panel: Recent Labs    11/04/21 1144 01/06/22 1026 04/21/22 0000 07/04/22 1219 07/07/22 0000 08/04/22 0000  NA 141 142 144 139  --  143  K 4.6 4.7 4.1 3.9  --  4.2  CL 105 103 106 103  --  102  CO2 30 28 27* 29  --  28*  GLUCOSE 96 87  --  99  --   --   BUN 17 16 16 14   --  16  CREATININE 0.71 0.77 0.8 0.80  --  0.8  CALCIUM 9.9 10.1 9.8 9.3  --  10.1  TSH  --   --   --   --  1.92  --    Liver Function Tests: Recent Labs    11/04/21 1144 02/10/22 0000 04/21/22 0000 07/04/22 1219  AST 18 37* 25 31  ALT 21 49* 31 53*  ALKPHOS 49 65 68 69  BILITOT 0.5  --   --  1.2  PROT 6.2*  --   --  6.1*   ALBUMIN 4.4 4.3 4.0 3.8   No results for input(s): "LIPASE", "AMYLASE" in the last 8760 hours. No results for input(s): "AMMONIA" in the last 8760 hours. CBC: Recent Labs    11/04/21 1144 01/06/22 1026 04/21/22 0000 07/04/22 1219 08/04/22 0000  WBC 6.8 7.6 7.2 8.0 5.7  NEUTROABS 4.5  --   --   --   --   HGB 14.6 14.8 15.4 15.9* 15.8  HCT 43.8 44.5 46 47.0* 47*  MCV 91.8 92  --  91.1  --   PLT 205 226 193 198 223   Lipid Panel: Recent Labs    02/10/22 0000 04/21/22 0000 08/04/22 0000  CHOL 142 122 152  HDL 62 58 63  LDLCALC 64 50 70  TRIG 81 66 91   Lab Results  Component Value Date   HGBA1C 4.8 05/11/2021    Procedures since last visit: No results found.  Assessment/Plan 1.Constipation Start on Miralax QD Add senokot plus 2 tabs at night  Discontinue Metamucil  2. Rash and possible Hemorrhoids Will Do Anusol HC suppository and Zinc with Nystatin for rash  3. Paroxysmal atrial fibrillation (HCC) Eliquis Also oN Metoprolol  4. Dizziness Due to her Meningoma HAS done well in [place] with no falls  5. Memory loss Doing well in [place]  6. Meningioma [facility]) Has seen Neurosurgery before and Decided for Conservative management   7. Essential hypertension On Toprol  Other  issues  History of CVA (cerebrovascular accident) On Eliquis Statin Aspirin not added per Neurology   History of breast cancer In Remission Follow with Oncology PRN Annually Mammogram  Pure hypercholesterolemia Continue Statin LDL 70 in 11/22  Lung nodule No Further Follow up needed        Labs/tests ordered:  * No order type specified * Next appt:  10/03/2022

## 2022-09-26 ENCOUNTER — Encounter: Payer: Medicare PPO | Admitting: Adult Health

## 2022-09-30 DIAGNOSIS — R413 Other amnesia: Secondary | ICD-10-CM | POA: Diagnosis not present

## 2022-09-30 DIAGNOSIS — R4189 Other symptoms and signs involving cognitive functions and awareness: Secondary | ICD-10-CM | POA: Diagnosis not present

## 2022-09-30 DIAGNOSIS — R41841 Cognitive communication deficit: Secondary | ICD-10-CM | POA: Diagnosis not present

## 2022-10-03 ENCOUNTER — Encounter: Payer: Self-pay | Admitting: Adult Health

## 2022-10-03 ENCOUNTER — Non-Acute Institutional Stay: Payer: Medicare PPO | Admitting: Adult Health

## 2022-10-03 VITALS — BP 136/86 | HR 71 | Temp 97.9°F | Resp 17 | Ht 60.0 in | Wt 141.0 lb

## 2022-10-03 DIAGNOSIS — R21 Rash and other nonspecific skin eruption: Secondary | ICD-10-CM

## 2022-10-03 DIAGNOSIS — K5901 Slow transit constipation: Secondary | ICD-10-CM

## 2022-10-03 MED ORDER — SENNA-DOCUSATE SODIUM 8.6-50 MG PO TABS: 2.0000 | ORAL_TABLET | Freq: Every day | ORAL | 0 refills | Status: DC

## 2022-10-03 MED ORDER — SENNA-DOCUSATE SODIUM 8.6-50 MG PO TABS: 2.0000 | ORAL_TABLET | Freq: Every evening | ORAL | 0 refills | Status: DC | PRN

## 2022-10-03 NOTE — Progress Notes (Signed)
Location:  [facility]  POS: Clinic  Provider: Fletcher Anon, ANP   Goals of Care:     09/20/2022    9:17 AM  Advanced Directives  Does Patient Have a Medical Advance Directive? Yes  Type of Estate agent of [place];Living will  Does patient want to make changes to medical advance directive? No - Patient declined  Copy of Healthcare Power of Attorney in Chart? Yes - validated most recent copy scanned in chart (See row information)     Chief Complaint  Patient presents with   Acute Visit    Patient states she has been having constipation, bloating, then diarrhea for a while that is not getting better     HPI: Patient is a 89 y.o. female seen today for constipation  She was originally scheduled for an annual wellness visit but forgot and thought she was here for an acute apt. She had issues with her bowels and wanted to discuss. She was having difficulty relaying the issue and it was difficult to sort out.  She saw Dr Chales Abrahams and was found to have constipation miralax and senokot were added on 09/20/22. She reported some bloating and diarrhea which resolved. She denied taking any laxatives but in review of the record she is taking the miralax but refusing the senokot.  She also had a rash to her buttocks and was prescribed nystatin and reports he rash has resolved. She also was prescribed anusol for hemorrhoids. Denies any rectal pain or irritation now.   Past Medical History:  Diagnosis Date   Allergy    Ankle fracture, left    Atrial fibrillation    Per [facility] New Patient Packet    Breast cancer 1115/16   left    Bursitis of right shoulder    Cataract    Family history of cancer    Fracture of right wrist    GERD (gastroesophageal reflux disease)    Hyperlipidemia    Hypertension    Lumbar radiculitis    Per [facility] New Patient Packet    Meningioma    Per [facility] New Patient Packet    Skin cancer 2000   melanoma and basal cell   Stress fracture    Right  Heel, Per [facility] New Patient Packet    TIA (transient ischemic attack) 2022   Vaso vagal episode    during preparation for colonoscopy    Past Surgical History:  Procedure Laterality Date   ABDOMINAL HYSTERECTOMY  1988   TAH/BSO--FIBROIDS   APPENDECTOMY     BREAST LUMPECTOMY     B/L--[facility]   BREAST LUMPECTOMY WITH RADIOACTIVE SEED AND SENTINEL LYMPH NODE BIOPSY Left 06/11/2015   Procedure: LEFT BREAST LUMPECTOMY WITH RADIOACTIVE SEED AND LEFT SENTINEL LYMPH NODE MAPPING;  Surgeon: Harriette Bouillon, MD;  [facility]: [facility];  Service: General;  Laterality: Left;   CARDIOVERSION N/A 01/11/2022   Procedure: CARDIOVERSION;  Surgeon: Jodelle Red, MD;  Location: [facility] ENDOSCOPY;  Service: Cardiovascular;  Laterality: N/A;   CATARACT EXTRACTION  2015   COLONOSCOPY  2010   DG  BONE DENSITY ([facility] HX)     EYE SURGERY Bilateral    cataracts   fiberadenoma Bilateral 1978, 1980   GANGLION CYST EXCISION     L  hand   Lipiflow procedure      Allergies  Allergen Reactions   Sulfa Antibiotics Other (See Comments)    As a child, rigid as a stick and not responsive   Tape Other (See Comments)  Blisters, Please use "paper" tape only for short periods of time   Wound Dressing Adhesive Other (See Comments)    Blisters, Please use "paper" tape only for short periods of time, Blisters, Please use "paper" tape only for short periods of time   Glucosamine Forte [Nutritional Supplements] Rash   Latex Rash    Outpatient Encounter Medications as of 10/03/2022  Medication Sig   acetaminophen (TYLENOL) 325 MG tablet Take 325 mg by mouth every 6 (six) hours as needed for moderate pain.   Biotin 5 MG CAPS Take 5 mg by mouth daily.    Calcium Carbonate-Vitamin D 600-400 MG-UNIT tablet Take 1 tablet by mouth daily.   Cholecalciferol (VITAMIN D3) 1000 UNITS CAPS Take 1,000 Units by mouth daily.   cycloSPORINE (RESTASIS) 0.05 % ophthalmic emulsion Place 1 drop into both eyes 2 (two) times daily.   ELIQUIS 5 MG  TABS tablet TAKE ONE TABLET BY MOUTH TWICE DAILY   metoprolol succinate (TOPROL XL) 25 MG 24 hr tablet Take 25 mg every morning   Multiple Vitamins-Minerals (PRESERVISION AREDS) TABS Take 2 tablets by mouth daily.   Omega-3 Fatty Acids (FISH OIL PO) Take 1 Capful by mouth daily.   polyethylene glycol (MIRALAX / GLYCOLAX) 17 g packet Take 17 g by mouth once.   pravastatin (PRAVACHOL) 40 MG tablet TAKE ONE TABLET BY MOUTH ONCE DAILY   Psyllium (METAMUCIL PO) Take 1 Dose by mouth daily. 1 teaspoon   Turmeric 500 MG CAPS Take 1 capsule by mouth daily.   vitamin B-12 (CYANOCOBALAMIN) 1000 MCG tablet Take 1 tablet (1,000 mcg total) by mouth daily.   No facility-administered encounter medications on file as of 10/03/2022.    Review of Systems:  Review of Systems  Constitutional:  Negative for activity change, appetite change, chills, diaphoresis, fatigue, fever and unexpected weight change.  HENT:  Negative for congestion.   Respiratory:  Negative for cough, shortness of breath and wheezing.   Cardiovascular:  Negative for chest pain, palpitations and leg swelling.  Gastrointestinal:  Positive for constipation and diarrhea. Negative for abdominal distention, abdominal pain, nausea and vomiting.  Genitourinary:  Negative for difficulty urinating and dysuria.  Musculoskeletal:  Positive for gait problem. Negative for arthralgias, back pain, joint swelling and myalgias.  Neurological:  Positive for dizziness (intermittent chronic). Negative for tremors, seizures, syncope, facial asymmetry, speech difficulty, weakness, light-headedness, numbness and headaches.  Psychiatric/Behavioral:  Positive for confusion. Negative for agitation and behavioral problems.     Health Maintenance  Topic Date Due   Medicare Annual Wellness (AWV)  10/04/2022 (Originally 11/13/2020)   COVID-19 Vaccine (6 - 2023-24 season) 02/15/2023 (Originally 06/22/2022)   INFLUENZA VACCINE  01/26/2023   MAMMOGRAM  07/12/2023    DTaP/Tdap/Td (5 - Td or Tdap) 10/24/2030   Pneumonia Vaccine 29+ Years old  Completed   DEXA SCAN  Completed   Zoster Vaccines- Shingrix  Completed   HPV VACCINES  Aged Out    Physical Exam: Vitals:   10/03/22 1454  BP: 136/86  Pulse: 71  Resp: 17  Temp: 97.9 F (36.6 C)  TempSrc: Temporal  SpO2: 97%  Weight: 141 lb (64 kg)  Height: 5' (1.524 m)   Body mass index is 27.54 kg/m. Physical Exam Vitals and nursing note reviewed.  Constitutional:      General: She is not in acute distress.    Appearance: She is not diaphoretic.  HENT:     Head: Normocephalic and atraumatic.  Neck:     Vascular: No JVD.  Cardiovascular:     Rate and Rhythm: Normal rate and regular rhythm.     Heart sounds: No murmur heard. Pulmonary:     Effort: Pulmonary effort is normal. No respiratory distress.     Breath sounds: Normal breath sounds. No wheezing.  Abdominal:     General: Bowel sounds are normal. There is no distension.     Palpations: Abdomen is soft.     Tenderness: There is no abdominal tenderness.  Skin:    General: Skin is warm and dry.  Neurological:     Mental Status: She is alert and oriented to person, place, and time.     Labs reviewed: Basic Metabolic Panel: Recent Labs    11/04/21 1144 11/04/21 1144 01/06/22 1026 04/21/22 0000 07/04/22 1219 07/07/22 0000 08/04/22 0000  NA 141   < > 142 144 139  --  143  K 4.6  --  4.7 4.1 3.9  --  4.2  CL 105  --  103 106 103  --  102  CO2 30  --  28 27* 29  --  28*  GLUCOSE 96  --  87  --  99  --   --   BUN 17   < > 16 16 14   --  16  CREATININE 0.71  --  0.77 0.8 0.80  --  0.8  CALCIUM 9.9  --  10.1 9.8 9.3  --  10.1  TSH  --   --   --   --   --  1.92  --    < > = values in this interval not displayed.   Liver Function Tests: Recent Labs    11/04/21 1144 02/10/22 0000 04/21/22 0000 07/04/22 1219  AST 18 37* 25 31  ALT 21 49* 31 53*  ALKPHOS 49 65 68 69  BILITOT 0.5  --   --  1.2  PROT 6.2*  --   --  6.1*   ALBUMIN 4.4 4.3 4.0 3.8   No results for input(s): "LIPASE", "AMYLASE" in the last 8760 hours. No results for input(s): "AMMONIA" in the last 8760 hours. CBC: Recent Labs    11/04/21 1144 01/06/22 1026 01/06/22 1026 04/21/22 0000 07/04/22 1219 08/04/22 0000  WBC 6.8 7.6   < > 7.2 8.0 5.7  NEUTROABS 4.5  --   --   --   --   --   HGB 14.6 14.8  --  15.4 15.9* 15.8  HCT 43.8 44.5  --  46 47.0* 47*  MCV 91.8 92  --   --  91.1  --   PLT 205 226  --  193 198 223   < > = values in this interval not displayed.   Lipid Panel: Recent Labs    02/10/22 0000 04/21/22 0000 08/04/22 0000  CHOL 142 122 152  HDL 62 58 63  LDLCALC 64 50 70  TRIG 81 66 91   Lab Results  Component Value Date   HGBA1C 4.8 05/11/2021    Procedures since last visit: No results found.  Assessment/Plan  1. Slow transit constipation Improved with miralax Change senna s to prn as patient is refusing and reports bowel movements are better.  Had some issues with her communicating her needs and relating the issues at hand   2. Rash Resolved.    Labs/tests ordered:  * No order type specified * Next appt:  11/22/2022   Total time **:  time greater than 50% of total time spent doing  pt counseling and coordination of care

## 2022-10-04 DIAGNOSIS — H353131 Nonexudative age-related macular degeneration, bilateral, early dry stage: Secondary | ICD-10-CM | POA: Diagnosis not present

## 2022-10-04 DIAGNOSIS — H16223 Keratoconjunctivitis sicca, not specified as Sjogren's, bilateral: Secondary | ICD-10-CM | POA: Diagnosis not present

## 2022-10-05 DIAGNOSIS — R4189 Other symptoms and signs involving cognitive functions and awareness: Secondary | ICD-10-CM | POA: Diagnosis not present

## 2022-10-05 DIAGNOSIS — R413 Other amnesia: Secondary | ICD-10-CM | POA: Diagnosis not present

## 2022-10-05 DIAGNOSIS — R41841 Cognitive communication deficit: Secondary | ICD-10-CM | POA: Diagnosis not present

## 2022-10-06 DIAGNOSIS — R413 Other amnesia: Secondary | ICD-10-CM | POA: Diagnosis not present

## 2022-10-06 DIAGNOSIS — R4189 Other symptoms and signs involving cognitive functions and awareness: Secondary | ICD-10-CM | POA: Diagnosis not present

## 2022-10-06 DIAGNOSIS — R41841 Cognitive communication deficit: Secondary | ICD-10-CM | POA: Diagnosis not present

## 2022-10-07 DIAGNOSIS — M6389 Disorders of muscle in diseases classified elsewhere, multiple sites: Secondary | ICD-10-CM | POA: Diagnosis not present

## 2022-10-07 DIAGNOSIS — R278 Other lack of coordination: Secondary | ICD-10-CM | POA: Diagnosis not present

## 2022-10-07 DIAGNOSIS — Z9181 History of falling: Secondary | ICD-10-CM | POA: Diagnosis not present

## 2022-10-07 DIAGNOSIS — R42 Dizziness and giddiness: Secondary | ICD-10-CM | POA: Diagnosis not present

## 2022-10-07 DIAGNOSIS — R4189 Other symptoms and signs involving cognitive functions and awareness: Secondary | ICD-10-CM | POA: Diagnosis not present

## 2022-10-07 DIAGNOSIS — M4726 Other spondylosis with radiculopathy, lumbar region: Secondary | ICD-10-CM | POA: Diagnosis not present

## 2022-10-12 DIAGNOSIS — L570 Actinic keratosis: Secondary | ICD-10-CM | POA: Diagnosis not present

## 2022-10-12 DIAGNOSIS — R413 Other amnesia: Secondary | ICD-10-CM | POA: Diagnosis not present

## 2022-10-12 DIAGNOSIS — R4189 Other symptoms and signs involving cognitive functions and awareness: Secondary | ICD-10-CM | POA: Diagnosis not present

## 2022-10-12 DIAGNOSIS — L821 Other seborrheic keratosis: Secondary | ICD-10-CM | POA: Diagnosis not present

## 2022-10-12 DIAGNOSIS — L820 Inflamed seborrheic keratosis: Secondary | ICD-10-CM | POA: Diagnosis not present

## 2022-10-12 DIAGNOSIS — R41841 Cognitive communication deficit: Secondary | ICD-10-CM | POA: Diagnosis not present

## 2022-10-12 DIAGNOSIS — L853 Xerosis cutis: Secondary | ICD-10-CM | POA: Diagnosis not present

## 2022-10-14 DIAGNOSIS — R4189 Other symptoms and signs involving cognitive functions and awareness: Secondary | ICD-10-CM | POA: Diagnosis not present

## 2022-10-14 DIAGNOSIS — M6389 Disorders of muscle in diseases classified elsewhere, multiple sites: Secondary | ICD-10-CM | POA: Diagnosis not present

## 2022-10-14 DIAGNOSIS — R278 Other lack of coordination: Secondary | ICD-10-CM | POA: Diagnosis not present

## 2022-10-14 DIAGNOSIS — R42 Dizziness and giddiness: Secondary | ICD-10-CM | POA: Diagnosis not present

## 2022-10-14 DIAGNOSIS — Z9181 History of falling: Secondary | ICD-10-CM | POA: Diagnosis not present

## 2022-10-14 DIAGNOSIS — M4726 Other spondylosis with radiculopathy, lumbar region: Secondary | ICD-10-CM | POA: Diagnosis not present

## 2022-10-19 DIAGNOSIS — R413 Other amnesia: Secondary | ICD-10-CM | POA: Diagnosis not present

## 2022-10-19 DIAGNOSIS — R4189 Other symptoms and signs involving cognitive functions and awareness: Secondary | ICD-10-CM | POA: Diagnosis not present

## 2022-10-19 DIAGNOSIS — R41841 Cognitive communication deficit: Secondary | ICD-10-CM | POA: Diagnosis not present

## 2022-10-21 DIAGNOSIS — M6389 Disorders of muscle in diseases classified elsewhere, multiple sites: Secondary | ICD-10-CM | POA: Diagnosis not present

## 2022-10-21 DIAGNOSIS — R278 Other lack of coordination: Secondary | ICD-10-CM | POA: Diagnosis not present

## 2022-10-21 DIAGNOSIS — R42 Dizziness and giddiness: Secondary | ICD-10-CM | POA: Diagnosis not present

## 2022-10-21 DIAGNOSIS — M4726 Other spondylosis with radiculopathy, lumbar region: Secondary | ICD-10-CM | POA: Diagnosis not present

## 2022-10-21 DIAGNOSIS — R4189 Other symptoms and signs involving cognitive functions and awareness: Secondary | ICD-10-CM | POA: Diagnosis not present

## 2022-10-21 DIAGNOSIS — Z9181 History of falling: Secondary | ICD-10-CM | POA: Diagnosis not present

## 2022-10-25 DIAGNOSIS — Z9181 History of falling: Secondary | ICD-10-CM | POA: Diagnosis not present

## 2022-10-25 DIAGNOSIS — R278 Other lack of coordination: Secondary | ICD-10-CM | POA: Diagnosis not present

## 2022-10-25 DIAGNOSIS — R4189 Other symptoms and signs involving cognitive functions and awareness: Secondary | ICD-10-CM | POA: Diagnosis not present

## 2022-10-25 DIAGNOSIS — R42 Dizziness and giddiness: Secondary | ICD-10-CM | POA: Diagnosis not present

## 2022-10-25 DIAGNOSIS — M6389 Disorders of muscle in diseases classified elsewhere, multiple sites: Secondary | ICD-10-CM | POA: Diagnosis not present

## 2022-10-25 DIAGNOSIS — M4726 Other spondylosis with radiculopathy, lumbar region: Secondary | ICD-10-CM | POA: Diagnosis not present

## 2022-10-27 DIAGNOSIS — R413 Other amnesia: Secondary | ICD-10-CM | POA: Diagnosis not present

## 2022-10-27 DIAGNOSIS — R41841 Cognitive communication deficit: Secondary | ICD-10-CM | POA: Diagnosis not present

## 2022-10-27 DIAGNOSIS — R4189 Other symptoms and signs involving cognitive functions and awareness: Secondary | ICD-10-CM | POA: Diagnosis not present

## 2022-10-28 ENCOUNTER — Ambulatory Visit (INDEPENDENT_AMBULATORY_CARE_PROVIDER_SITE_OTHER)
Admission: RE | Admit: 2022-10-28 | Discharge: 2022-10-28 | Disposition: A | Discharge status: Home / Self Care | Payer: Medicare PPO | Source: Ambulatory Visit | Attending: Internal Medicine | Admitting: Internal Medicine

## 2022-10-28 ENCOUNTER — Ambulatory Visit: Payer: Medicare PPO | Admitting: Internal Medicine

## 2022-10-28 ENCOUNTER — Encounter: Payer: Self-pay | Admitting: Internal Medicine

## 2022-10-28 VITALS — BP 120/78 | HR 72 | Ht 60.0 in | Wt 137.5 lb

## 2022-10-28 DIAGNOSIS — K5909 Other constipation: Secondary | ICD-10-CM | POA: Diagnosis not present

## 2022-10-28 DIAGNOSIS — N816 Rectocele: Secondary | ICD-10-CM | POA: Diagnosis not present

## 2022-10-28 DIAGNOSIS — K649 Unspecified hemorrhoids: Secondary | ICD-10-CM

## 2022-10-28 DIAGNOSIS — K5900 Constipation, unspecified: Secondary | ICD-10-CM | POA: Diagnosis not present

## 2022-10-28 NOTE — Progress Notes (Unsigned)
Jessica Mullins Patient 89 y.o. 18-Jan-1931 [id number]  Assessment & Plan:   Encounter Diagnoses  Name Primary?   Chronic constipation Yes   Rectocele    Hemorrhoids, unspecified hemorrhoid type    I suspect there is a component of outlet dysfunction here with the rectocele and she probably has pelvic floor dysfunction.  I am going to order an abdominal film to look for high impaction.  She is not impacted on exam today.  Further plans pending that suspect some adjustment of MiraLAX plus or minus Dulcolax or Senokot.  May need other pharmacologic therapy.  Question if she could participate in pelvic floor physical therapy will communicate with providers at [facility]. Orders Placed This Encounter  Procedures   DG Abd 2 Views    CC: Jessica Gammon, MD    Subjective:   Chief Complaint: Constipation and hemorrhoids and rectal pain  HPI 89 year old white woman, a resident of [facility], who returns at the request of Jessica Mullins and Jessica Mullins because of constipation and hemorrhoid issues.  She has a history of chronic constipation and some rare bleeding with hemorrhoids, and had discomfort in the rectum.  She has a rectocele.  History is not completely reliable, she was started back on MiraLAX and Senokot in March.  She is refusing the Senokot.  She also had a perianal dermatitis treated with nystatin.  I think she is moving her bowels somewhat better but she alternates and it sounds like she goes several days without effective bowel movements and then unloads with multiple bowel movements.  This pattern is not new.  Sometimes it is sore in the anal area or rectal area she is not describing itching.  She is frustrated with the loss of control she has had in her assisted living center.  Allergies  Allergen Reactions   Sulfa Antibiotics Other (See Comments)    As a child, rigid as a stick and not responsive   Tape Other (See Comments)    Blisters, Please use "paper" tape only for short periods  of time   Wound Dressing Adhesive Other (See Comments)    Blisters, Please use "paper" tape only for short periods of time, Blisters, Please use "paper" tape only for short periods of time   Glucosamine Forte [Nutritional Supplements] Rash   Latex Rash   Current Meds  Medication Sig   acetaminophen (TYLENOL) 325 MG tablet Take 325 mg by mouth every 6 (six) hours as needed for moderate pain.   Biotin 5 MG CAPS Take 5 mg by mouth daily.    Calcium Carbonate-Vitamin D 600-400 MG-UNIT tablet Take 1 tablet by mouth daily.   Cholecalciferol (VITAMIN D3) 1000 UNITS CAPS Take 1,000 Units by mouth daily.   cycloSPORINE (RESTASIS) 0.05 % ophthalmic emulsion Place 1 drop into both eyes 2 (two) times daily.   ELIQUIS 5 MG TABS tablet TAKE ONE TABLET BY MOUTH TWICE DAILY   metoprolol succinate (TOPROL XL) 25 MG 24 hr tablet Take 25 mg every morning   Multiple Vitamins-Minerals (PRESERVISION AREDS) TABS Take 2 tablets by mouth daily.   Omega-3 Fatty Acids (FISH OIL PO) Take 1 Capful by mouth daily.   polyethylene glycol (MIRALAX / GLYCOLAX) 17 g packet Take 17 g by mouth once.   pravastatin (PRAVACHOL) 40 MG tablet TAKE ONE TABLET BY MOUTH ONCE DAILY   Psyllium (METAMUCIL PO) Take 1 Dose by mouth daily. 1 teaspoon   sennosides-docusate sodium (SENOKOT-S) 8.6-50 MG tablet Take 2 tablets by mouth at bedtime as  needed for constipation.   Turmeric 500 MG CAPS Take 1 capsule by mouth daily.   vitamin B-12 (CYANOCOBALAMIN) 1000 MCG tablet Take 1 tablet (1,000 mcg total) by mouth daily.   Past Medical History:  Diagnosis Date   Allergy    Ankle fracture, left    Atrial fibrillation (HCC)    Per [facility] New Patient Packet    Breast cancer (HCC) 1115/16   left    Bursitis of right shoulder    Cataract    Family history of cancer    Fracture of right wrist    GERD (gastroesophageal reflux disease)    Hyperlipidemia    Hypertension    Lumbar radiculitis    Per [facility] New Patient Packet    Meningioma [facility])     Per [facility] New Patient Packet    Skin cancer 2000   melanoma and basal cell   Stress fracture    Right Heel, Per [facility] New Patient Packet    TIA (transient ischemic attack) 2022   Vaso vagal episode    during preparation for colonoscopy   Past Surgical History:  Procedure Laterality Date   ABDOMINAL HYSTERECTOMY  1988   TAH/BSO--FIBROIDS   APPENDECTOMY     BREAST LUMPECTOMY     B/[facility]ullins--[facility]   BREAST LUMPECTOMY WITH RADIOACTIVE SEED AND SENTINEL LYMPH NODE BIOPSY Left 06/11/2015   Procedure: LEFT BREAST LUMPECTOMY WITH RADIOACTIVE SEED AND LEFT SENTINEL LYMPH NODE MAPPING;  Surgeon: Jessica Bouillon, MD;  Location: [facility] OR;  Service: General;  Laterality: Left;   CARDIOVERSION N/A 01/11/2022   Procedure: CARDIOVERSION;  Surgeon: Jessica Red, MD;  Location: [facility] ENDOSCOPY;  Service: Cardiovascular;  Laterality: N/A;   CATARACT EXTRACTION  2015   COLONOSCOPY  2010   DG  BONE DENSITY ([facility] HX)     EYE SURGERY Bilateral    cataracts   fiberadenoma Bilateral 1978, 1980   GANGLION CYST EXCISION     Mullins  hand   Lipiflow procedure     Social History   Social History Narrative   Patient is widowed.  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    One son one daughter   1 caffeinated beverage daily   She is right-handed.    She works out everyday to a televised [employer] 30 minute program.   No tobacco or alcohol         Per [facility] New Patient Packet Abstracted on 01/03/2020      Diet: Left blank       Caffeine: Yes      Married, if yes what year: Widowed, married in 1955      Do you live in a house, apartment, assisted living, condo, trailer, ect: Apartment      Is it one or more stories: One stories, one person       Pets: 1 Medical laboratory scientific officer      Current/Past profession: Runner, broadcasting/film/video      Highest level or education completed: MA of Education       Exercise:   Yes             Type and how often: Cardio, chair aerobics, 3-4 times weekly          Living Will: Yes   DNR: Yes   POA/HPOA:  Yes      Functional Status:   Do you have difficulty bathing or dressing yourself? Left blank   Do you have difficulty preparing food or eating? Left blank   Do you  have difficulty managing your medications? Left blank   Do you have difficulty managing your finances? Left blank   Do you have difficulty affording your medications? Left blank   family history includes Cancer in her mother; Coronary artery disease in her brother; Diabetes in her brother and maternal aunt; Heart disease in her brother; Heart disease (age of onset: 65) in her father; High Cholesterol in her son; Hyperlipidemia in her brother; Hypertension in her brother and son; Lung cancer in an other family member; Lung cancer (age of onset: 21) in her sister; Thrombosis in an other family member.   Review of Systems As per HPI  Objective:   Physical Exam BP 120/78   Pulse 72   Ht 5' (1.524 m)   Wt 137 lb 8 oz (62.4 kg)   BMI 26.85 kg/m  Elderly ww nad  Abd soft NT no mass, BS +  Rectal  [employer] CMA present  Rectal - perianal erythema - mild, small external hemorrhoids DRE - no impaction, tan stool, moderate rectocele, NL resting anal tone

## 2022-10-28 NOTE — Patient Instructions (Signed)
Please go to the x-ray department today before leaving.  We will contact you with results and plans.   I appreciate the opportunity to care for you. Stan Head, MD, [facility]

## 2022-10-31 DIAGNOSIS — M6389 Disorders of muscle in diseases classified elsewhere, multiple sites: Secondary | ICD-10-CM | POA: Diagnosis not present

## 2022-10-31 DIAGNOSIS — M4726 Other spondylosis with radiculopathy, lumbar region: Secondary | ICD-10-CM | POA: Diagnosis not present

## 2022-10-31 DIAGNOSIS — R4189 Other symptoms and signs involving cognitive functions and awareness: Secondary | ICD-10-CM | POA: Diagnosis not present

## 2022-10-31 DIAGNOSIS — Z9181 History of falling: Secondary | ICD-10-CM | POA: Diagnosis not present

## 2022-10-31 DIAGNOSIS — R42 Dizziness and giddiness: Secondary | ICD-10-CM | POA: Diagnosis not present

## 2022-10-31 DIAGNOSIS — R278 Other lack of coordination: Secondary | ICD-10-CM | POA: Diagnosis not present

## 2022-11-07 DIAGNOSIS — M6389 Disorders of muscle in diseases classified elsewhere, multiple sites: Secondary | ICD-10-CM | POA: Diagnosis not present

## 2022-11-07 DIAGNOSIS — Z9181 History of falling: Secondary | ICD-10-CM | POA: Diagnosis not present

## 2022-11-07 DIAGNOSIS — R278 Other lack of coordination: Secondary | ICD-10-CM | POA: Diagnosis not present

## 2022-11-07 DIAGNOSIS — R4189 Other symptoms and signs involving cognitive functions and awareness: Secondary | ICD-10-CM | POA: Diagnosis not present

## 2022-11-07 DIAGNOSIS — R42 Dizziness and giddiness: Secondary | ICD-10-CM | POA: Diagnosis not present

## 2022-11-07 DIAGNOSIS — M4726 Other spondylosis with radiculopathy, lumbar region: Secondary | ICD-10-CM | POA: Diagnosis not present

## 2022-11-09 DIAGNOSIS — L570 Actinic keratosis: Secondary | ICD-10-CM | POA: Diagnosis not present

## 2022-11-09 DIAGNOSIS — L821 Other seborrheic keratosis: Secondary | ICD-10-CM | POA: Diagnosis not present

## 2022-11-09 DIAGNOSIS — L814 Other melanin hyperpigmentation: Secondary | ICD-10-CM | POA: Diagnosis not present

## 2022-11-09 DIAGNOSIS — L578 Other skin changes due to chronic exposure to nonionizing radiation: Secondary | ICD-10-CM | POA: Diagnosis not present

## 2022-11-14 DIAGNOSIS — R4189 Other symptoms and signs involving cognitive functions and awareness: Secondary | ICD-10-CM | POA: Diagnosis not present

## 2022-11-14 DIAGNOSIS — M6389 Disorders of muscle in diseases classified elsewhere, multiple sites: Secondary | ICD-10-CM | POA: Diagnosis not present

## 2022-11-14 DIAGNOSIS — M4726 Other spondylosis with radiculopathy, lumbar region: Secondary | ICD-10-CM | POA: Diagnosis not present

## 2022-11-14 DIAGNOSIS — R278 Other lack of coordination: Secondary | ICD-10-CM | POA: Diagnosis not present

## 2022-11-14 DIAGNOSIS — R42 Dizziness and giddiness: Secondary | ICD-10-CM | POA: Diagnosis not present

## 2022-11-14 DIAGNOSIS — Z9181 History of falling: Secondary | ICD-10-CM | POA: Diagnosis not present

## 2022-11-21 DIAGNOSIS — I10 Essential (primary) hypertension: Secondary | ICD-10-CM | POA: Diagnosis not present

## 2022-11-21 LAB — BASIC METABOLIC PANEL
BUN: 20 (ref 4–21)
CO2: 29 — AB (ref 13–22)
Chloride: 105 (ref 99–108)
Creatinine: 0.9 (ref 0.5–1.1)
Glucose: 105
Potassium: 4 mEq/L (ref 3.5–5.1)
Sodium: 144 (ref 137–147)

## 2022-11-21 LAB — COMPREHENSIVE METABOLIC PANEL
Calcium: 9.8 (ref 8.7–10.7)
eGFR: 65

## 2022-11-22 ENCOUNTER — Encounter: Payer: Self-pay | Admitting: Internal Medicine

## 2022-11-22 ENCOUNTER — Non-Acute Institutional Stay: Payer: Medicare PPO | Admitting: Internal Medicine

## 2022-11-22 VITALS — BP 122/76 | HR 86 | Temp 97.8°F | Resp 18 | Ht 60.0 in | Wt 140.0 lb

## 2022-11-22 DIAGNOSIS — E7800 Pure hypercholesterolemia, unspecified: Secondary | ICD-10-CM | POA: Diagnosis not present

## 2022-11-22 DIAGNOSIS — R4189 Other symptoms and signs involving cognitive functions and awareness: Secondary | ICD-10-CM | POA: Diagnosis not present

## 2022-11-22 DIAGNOSIS — K5901 Slow transit constipation: Secondary | ICD-10-CM

## 2022-11-22 DIAGNOSIS — I10 Essential (primary) hypertension: Secondary | ICD-10-CM

## 2022-11-22 DIAGNOSIS — R42 Dizziness and giddiness: Secondary | ICD-10-CM | POA: Diagnosis not present

## 2022-11-22 DIAGNOSIS — R269 Unspecified abnormalities of gait and mobility: Secondary | ICD-10-CM

## 2022-11-22 DIAGNOSIS — I480 Paroxysmal atrial fibrillation: Secondary | ICD-10-CM

## 2022-11-25 DIAGNOSIS — Z9181 History of falling: Secondary | ICD-10-CM | POA: Diagnosis not present

## 2022-11-25 DIAGNOSIS — R42 Dizziness and giddiness: Secondary | ICD-10-CM | POA: Diagnosis not present

## 2022-11-25 DIAGNOSIS — M4726 Other spondylosis with radiculopathy, lumbar region: Secondary | ICD-10-CM | POA: Diagnosis not present

## 2022-11-25 DIAGNOSIS — M6389 Disorders of muscle in diseases classified elsewhere, multiple sites: Secondary | ICD-10-CM | POA: Diagnosis not present

## 2022-11-25 DIAGNOSIS — R278 Other lack of coordination: Secondary | ICD-10-CM | POA: Diagnosis not present

## 2022-11-25 DIAGNOSIS — R4189 Other symptoms and signs involving cognitive functions and awareness: Secondary | ICD-10-CM | POA: Diagnosis not present

## 2022-11-27 NOTE — Progress Notes (Signed)
Location:  [facility] [employer] features editor of Service:  Clinic (12)  Provider:   Code Status: DNR Goals of Care:     11/22/2022   10:48 AM  Advanced Directives  Does Patient Have a Medical Advance Directive? Yes  Type of Estate agent of [place];Living will  Does patient want to make changes to medical advance directive? No - Patient declined     Chief Complaint  Patient presents with   Medical Management of Chronic Issues    Patient is being seen for 3 month follow up    HPI: Patient is a 89 y.o. female seen today for medical management of chronic diseases.    [place] now  Dizziness and unsteadiness Has seen Dr Everlena Cooper and Dr Terrace Arabia now and They both think it is due to her Meningoma which is increasing the pressure on her Brain stem   Today she was running around since this morning and now feels Dizzy  But over all doing better since being in the [place] [facility] with her walker No falls  Constipation Most Symptoms resolved Maintained on Miralax PAF Doing well on Eliquis and Toprol Low back pain  Does not want to take anything beside tylenol   Other issues Patient has h/o Meningoma Diagnosed with MRI in 4/21 Has seen Neurosurgery H/o Low Back Pain with Radiculopathy follows with Neurology H/o Breast Cancer Invasive Ductal Carcinoma diagnosed in 2016 Osteopenia Last DEXA was -1.6 in 2020 H/o PAF  Follows with Cardiology On Eliquis Also h/o Bradycardia Hypertension with Tendency for Orthostatic Hypotension    Past Medical History:  Diagnosis Date   Allergy    Ankle fracture, left    Atrial fibrillation (HCC)    Per [facility] New Patient Packet    Breast cancer (HCC) 1115/16   left    Bursitis of right shoulder    Cataract    Family history of cancer    Fracture of right wrist    GERD (gastroesophageal reflux disease)    Hyperlipidemia    Hypertension    Lumbar radiculitis    Per [facility] New Patient Packet    Meningioma [facility])    Per  [facility] New Patient Packet    Skin cancer 2000   melanoma and basal cell   Stress fracture    Right Heel, Per [facility] New Patient Packet    TIA (transient ischemic attack) 2022   Vaso vagal episode    during preparation for colonoscopy    Past Surgical History:  Procedure Laterality Date   ABDOMINAL HYSTERECTOMY  1988   TAH/BSO--FIBROIDS   APPENDECTOMY     BREAST LUMPECTOMY     B/L--[facility]   BREAST LUMPECTOMY WITH RADIOACTIVE SEED AND SENTINEL LYMPH NODE BIOPSY Left 06/11/2015   Procedure: LEFT BREAST LUMPECTOMY WITH RADIOACTIVE SEED AND LEFT SENTINEL LYMPH NODE MAPPING;  Surgeon: Harriette Bouillon, MD;  Location: [facility] OR;  Service: General;  Laterality: Left;   CARDIOVERSION N/A 01/11/2022   Procedure: CARDIOVERSION;  Surgeon: Jodelle Red, MD;  Location: [facility] ENDOSCOPY;  Service: Cardiovascular;  Laterality: N/A;   CATARACT EXTRACTION  2015   COLONOSCOPY  2010   DG  BONE DENSITY ([facility] HX)     EYE SURGERY Bilateral    cataracts   fiberadenoma Bilateral 1978, 1980   GANGLION CYST EXCISION     L  hand   Lipiflow procedure      Allergies  Allergen Reactions   Sulfa Antibiotics Other (See Comments)    As a  child, rigid as a stick and not responsive   Tape Other (See Comments)    Blisters, Please use "paper" tape only for short periods of time   Wound Dressing Adhesive Other (See Comments)    Blisters, Please use "paper" tape only for short periods of time, Blisters, Please use "paper" tape only for short periods of time   Glucosamine Forte [Nutritional Supplements] Rash   Latex Rash    Outpatient Encounter Medications as of 11/22/2022  Medication Sig   acetaminophen (TYLENOL) 325 MG tablet Take 325 mg by mouth every 6 (six) hours as needed for moderate pain.   Biotin 5 MG CAPS Take 5 mg by mouth daily.    Calcium Carbonate-Vitamin D 600-400 MG-UNIT tablet Take 1 tablet by mouth daily.   Cholecalciferol (VITAMIN D3) 1000 UNITS CAPS Take 1,000 Units by mouth daily.   cycloSPORINE  (RESTASIS) 0.05 % ophthalmic emulsion Place 1 drop into both eyes 2 (two) times daily.   ELIQUIS 5 MG TABS tablet TAKE ONE TABLET BY MOUTH TWICE DAILY   loperamide (IMODIUM A-D) 2 MG tablet Take 2 mg by mouth 4 (four) times daily as needed for diarrhea or loose stools.   metoprolol succinate (TOPROL XL) 25 MG 24 hr tablet Take 25 mg every morning   Multiple Vitamins-Minerals (PRESERVISION AREDS) TABS Take 2 tablets by mouth daily.   Omega-3 Fatty Acids (FISH OIL PO) Take 1 Capful by mouth daily.   polyethylene glycol (MIRALAX / GLYCOLAX) 17 g packet Take 17 g by mouth once.   pravastatin (PRAVACHOL) 40 MG tablet TAKE ONE TABLET BY MOUTH ONCE DAILY   Psyllium (METAMUCIL PO) Take 1 Dose by mouth daily. 1 teaspoon   sennosides-docusate sodium (SENOKOT-S) 8.6-50 MG tablet Take 2 tablets by mouth at bedtime as needed for constipation.   Turmeric 500 MG CAPS Take 1 capsule by mouth daily.   vitamin B-12 (CYANOCOBALAMIN) 1000 MCG tablet Take 1 tablet (1,000 mcg total) by mouth daily.   [DISCONTINUED] predniSONE (DELTASONE) 20 MG tablet Take 1 tablet (20 mg total) by mouth daily with breakfast. (Patient not taking: Reported on 09/20/2022)   No facility-administered encounter medications on file as of 11/22/2022.    Review of Systems:  Review of Systems  Constitutional:  Positive for activity change. Negative for appetite change.  HENT: Negative.    Respiratory:  Negative for cough and shortness of breath.   Cardiovascular:  Negative for leg swelling.  Gastrointestinal:  Negative for constipation.  Genitourinary: Negative.   Musculoskeletal:  Positive for back pain and gait problem. Negative for arthralgias and myalgias.  Skin: Negative.   Neurological:  Positive for dizziness. Negative for weakness.  Psychiatric/Behavioral:  Positive for confusion. Negative for dysphoric mood and sleep disturbance.     Health Maintenance  Topic Date Due   Medicare Annual Wellness (AWV)  12/23/2022 (Originally  11/13/2020)   COVID-19 Vaccine (6 - 2023-24 season) 02/15/2023 (Originally 06/22/2022)   INFLUENZA VACCINE  01/26/2023   MAMMOGRAM  07/12/2023   DTaP/Tdap/Td (5 - Td or Tdap) 10/24/2030   Pneumonia Vaccine 60+ Years old  Completed   DEXA SCAN  Completed   Zoster Vaccines- Shingrix  Completed   HPV VACCINES  Aged Out    Physical Exam: Vitals:   11/22/22 1050  BP: 122/76  Pulse: 86  Resp: 18  Temp: 97.8 F (36.6 C)  TempSrc: Temporal  SpO2: 96%  Weight: 140 lb (63.5 kg)  Height: 5' (1.524 m)   Body mass index is 27.34 kg/m. Physical  Exam Vitals reviewed.  Constitutional:      Appearance: Normal appearance.  HENT:     Head: Normocephalic.     Nose: Nose normal.     Mouth/Throat:     Mouth: Mucous membranes are moist.     Pharynx: Oropharynx is clear.  Eyes:     Pupils: Pupils are equal, round, and reactive to light.  Cardiovascular:     Rate and Rhythm: Normal rate. Rhythm irregular.     Pulses: Normal pulses.     Heart sounds: Normal heart sounds. No murmur heard. Pulmonary:     Effort: Pulmonary effort is normal.     Breath sounds: Normal breath sounds.  Abdominal:     General: Abdomen is flat. Bowel sounds are normal.     Palpations: Abdomen is soft.  Musculoskeletal:        General: Swelling present.     Cervical back: Neck supple.  Skin:    General: Skin is warm.  Neurological:     General: No focal deficit present.     Mental Status: She is alert and oriented to person, place, and time.  Psychiatric:        Mood and Affect: Mood normal.        Thought Content: Thought content normal.     Labs reviewed: Basic Metabolic Panel: Recent Labs    01/06/22 1026 04/21/22 0000 07/04/22 1219 07/07/22 0000 08/04/22 0000  NA 142 144 139  --  143  K 4.7 4.1 3.9  --  4.2  CL 103 106 103  --  102  CO2 28 27* 29  --  28*  GLUCOSE 87  --  99  --   --   BUN 16 16 14   --  16  CREATININE 0.77 0.8 0.80  --  0.8  CALCIUM 10.1 9.8 9.3  --  10.1  TSH  --   --    --  1.92  --    Liver Function Tests: Recent Labs    02/10/22 0000 04/21/22 0000 07/04/22 1219  AST 37* 25 31  ALT 49* 31 53*  ALKPHOS 65 68 69  BILITOT  --   --  1.2  PROT  --   --  6.1*  ALBUMIN 4.3 4.0 3.8   No results for input(s): "LIPASE", "AMYLASE" in the last 8760 hours. No results for input(s): "AMMONIA" in the last 8760 hours. CBC: Recent Labs    01/06/22 1026 04/21/22 0000 07/04/22 1219 08/04/22 0000  WBC 7.6 7.2 8.0 5.7  HGB 14.8 15.4 15.9* 15.8  HCT 44.5 46 47.0* 47*  MCV 92  --  91.1  --   PLT 226 193 198 223   Lipid Panel: Recent Labs    02/10/22 0000 04/21/22 0000 08/04/22 0000  CHOL 142 122 152  HDL 62 58 63  LDLCALC 64 50 70  TRIG 81 66 91   Lab Results  Component Value Date   HGBA1C 4.8 05/11/2021    Procedures since last visit: No results found.  Assessment/Plan 1. Dizziness BMP done few days ago was normal Due to her Meningioma  2. Slow transit constipation Doing better  Needed cleaning with Miralax but now stable on one time dose  3. Paroxysmal atrial fibrillation (HCC) Toprol and Eliquis  4. Essential hypertension Loose Control due to her dizziness Toprol 5. Pure hypercholesterolemia Statin LDL 70 in 02/24  6. Gait abnormality Walks with her walker  7. Cognitive impairment Doing well in [place]  8 Chronic  Back pain Tylenol  Does not want anything stronger 9 History of breast cancer In Remission Follow with Oncology PRN Annually Mammogram 10  Lung nodule No Further Follow up needed  Labs/tests ordered:  * No order type specified * Next appt:  02/01/2023

## 2022-11-30 DIAGNOSIS — R278 Other lack of coordination: Secondary | ICD-10-CM | POA: Diagnosis not present

## 2022-11-30 DIAGNOSIS — Z9181 History of falling: Secondary | ICD-10-CM | POA: Diagnosis not present

## 2022-11-30 DIAGNOSIS — M6389 Disorders of muscle in diseases classified elsewhere, multiple sites: Secondary | ICD-10-CM | POA: Diagnosis not present

## 2022-11-30 DIAGNOSIS — R42 Dizziness and giddiness: Secondary | ICD-10-CM | POA: Diagnosis not present

## 2022-11-30 DIAGNOSIS — M4726 Other spondylosis with radiculopathy, lumbar region: Secondary | ICD-10-CM | POA: Diagnosis not present

## 2022-11-30 DIAGNOSIS — R4189 Other symptoms and signs involving cognitive functions and awareness: Secondary | ICD-10-CM | POA: Diagnosis not present

## 2022-12-04 ENCOUNTER — Telehealth: Payer: Self-pay | Admitting: Family

## 2022-12-04 ENCOUNTER — Other Ambulatory Visit: Payer: Self-pay

## 2022-12-04 ENCOUNTER — Emergency Department (HOSPITAL_COMMUNITY)
Admission: EM | Admit: 2022-12-04 | Discharge: 2022-12-05 | Disposition: A | Discharge status: Skilled Nursing Facility | Payer: Medicare PPO | Attending: Emergency Medicine | Admitting: Emergency Medicine

## 2022-12-04 ENCOUNTER — Encounter (HOSPITAL_COMMUNITY): Payer: Self-pay | Admitting: Radiology

## 2022-12-04 ENCOUNTER — Emergency Department (HOSPITAL_COMMUNITY): Payer: Medicare PPO

## 2022-12-04 DIAGNOSIS — Z7901 Long term (current) use of anticoagulants: Secondary | ICD-10-CM | POA: Insufficient documentation

## 2022-12-04 DIAGNOSIS — Z91040 Latex allergy status: Secondary | ICD-10-CM | POA: Insufficient documentation

## 2022-12-04 DIAGNOSIS — I10 Essential (primary) hypertension: Secondary | ICD-10-CM | POA: Diagnosis not present

## 2022-12-04 DIAGNOSIS — Z853 Personal history of malignant neoplasm of breast: Secondary | ICD-10-CM | POA: Diagnosis not present

## 2022-12-04 DIAGNOSIS — I4891 Unspecified atrial fibrillation: Secondary | ICD-10-CM | POA: Insufficient documentation

## 2022-12-04 DIAGNOSIS — Z79899 Other long term (current) drug therapy: Secondary | ICD-10-CM | POA: Diagnosis not present

## 2022-12-04 DIAGNOSIS — R202 Paresthesia of skin: Secondary | ICD-10-CM | POA: Diagnosis not present

## 2022-12-04 DIAGNOSIS — Z8673 Personal history of transient ischemic attack (TIA), and cerebral infarction without residual deficits: Secondary | ICD-10-CM | POA: Insufficient documentation

## 2022-12-04 DIAGNOSIS — R9082 White matter disease, unspecified: Secondary | ICD-10-CM | POA: Diagnosis not present

## 2022-12-04 DIAGNOSIS — R262 Difficulty in walking, not elsewhere classified: Secondary | ICD-10-CM | POA: Diagnosis not present

## 2022-12-04 DIAGNOSIS — Z1152 Encounter for screening for COVID-19: Secondary | ICD-10-CM | POA: Insufficient documentation

## 2022-12-04 DIAGNOSIS — I63323 Cerebral infarction due to thrombosis of bilateral anterior cerebral arteries: Secondary | ICD-10-CM | POA: Diagnosis not present

## 2022-12-04 DIAGNOSIS — R531 Weakness: Secondary | ICD-10-CM | POA: Insufficient documentation

## 2022-12-04 DIAGNOSIS — E785 Hyperlipidemia, unspecified: Secondary | ICD-10-CM | POA: Diagnosis not present

## 2022-12-04 DIAGNOSIS — G9389 Other specified disorders of brain: Secondary | ICD-10-CM | POA: Diagnosis not present

## 2022-12-04 LAB — CBC WITH DIFFERENTIAL/PLATELET
Abs Immature Granulocytes: 0.02 10*3/uL (ref 0.00–0.07)
Basophils Absolute: 0.1 10*3/uL (ref 0.0–0.1)
Basophils Relative: 1 %
Eosinophils Absolute: 0.1 10*3/uL (ref 0.0–0.5)
Eosinophils Relative: 1 %
HCT: 45.3 % (ref 36.0–46.0)
Hemoglobin: 15.3 g/dL — ABNORMAL HIGH (ref 12.0–15.0)
Immature Granulocytes: 0 %
Lymphocytes Relative: 22 %
Lymphs Abs: 1.8 10*3/uL (ref 0.7–4.0)
MCH: 31.1 pg (ref 26.0–34.0)
MCHC: 33.8 g/dL (ref 30.0–36.0)
MCV: 92.1 fL (ref 80.0–100.0)
Monocytes Absolute: 0.8 10*3/uL (ref 0.1–1.0)
Monocytes Relative: 9 %
Neutro Abs: 5.8 10*3/uL (ref 1.7–7.7)
Neutrophils Relative %: 67 %
Platelets: 191 10*3/uL (ref 150–400)
RBC: 4.92 MIL/uL (ref 3.87–5.11)
RDW: 14.9 % (ref 11.5–15.5)
WBC: 8.5 10*3/uL (ref 4.0–10.5)
nRBC: 0 % (ref 0.0–0.2)

## 2022-12-04 LAB — COMPREHENSIVE METABOLIC PANEL
ALT: 27 U/L (ref 0–44)
AST: 28 U/L (ref 15–41)
Albumin: 3.8 g/dL (ref 3.5–5.0)
Alkaline Phosphatase: 59 U/L (ref 38–126)
Anion gap: 9 (ref 5–15)
BUN: 28 mg/dL — ABNORMAL HIGH (ref 8–23)
CO2: 26 mmol/L (ref 22–32)
Calcium: 9.6 mg/dL (ref 8.9–10.3)
Chloride: 106 mmol/L (ref 98–111)
Creatinine, Ser: 1.06 mg/dL — ABNORMAL HIGH (ref 0.44–1.00)
GFR, Estimated: 50 mL/min — ABNORMAL LOW (ref >60)
Glucose, Bld: 96 mg/dL (ref 70–99)
Potassium: 4.1 mmol/L (ref 3.5–5.1)
Sodium: 141 mmol/L (ref 135–145)
Total Bilirubin: 1.1 mg/dL (ref 0.3–1.2)
Total Protein: 6.2 g/dL — ABNORMAL LOW (ref 6.5–8.1)

## 2022-12-04 LAB — URINALYSIS, ROUTINE W REFLEX MICROSCOPIC
Bacteria, UA: NONE SEEN
Bilirubin Urine: NEGATIVE
Glucose, UA: NEGATIVE mg/dL
Hgb urine dipstick: NEGATIVE
Ketones, ur: NEGATIVE mg/dL
Nitrite: NEGATIVE
Protein, ur: NEGATIVE mg/dL
Specific Gravity, Urine: 1.016 (ref 1.005–1.030)
pH: 6 (ref 5.0–8.0)

## 2022-12-04 LAB — TROPONIN I (HIGH SENSITIVITY)
Troponin I (High Sensitivity): 13 ng/L (ref <18)
Troponin I (High Sensitivity): 15 ng/L (ref <18)

## 2022-12-04 LAB — RESP PANEL BY RT-PCR (RSV, FLU A&B, COVID)  RVPGX2
Influenza A by PCR: NEGATIVE
Influenza B by PCR: NEGATIVE
Resp Syncytial Virus by PCR: NEGATIVE
SARS Coronavirus 2 by RT PCR: NEGATIVE

## 2022-12-04 LAB — MAGNESIUM: Magnesium: 2.5 mg/dL — ABNORMAL HIGH (ref 1.7–2.4)

## 2022-12-04 LAB — CBG MONITORING, ED: Glucose-Capillary: 95 mg/dL (ref 70–99)

## 2022-12-04 MED ORDER — IOHEXOL 350 MG/ML SOLN
75.0000 mL | Freq: Once | INTRAVENOUS | Status: AC | PRN
  Administered 2022-12-05: 75 mL via INTRAVENOUS

## 2022-12-04 NOTE — Telephone Encounter (Signed)
[facility] assisted [facility] facility nurse called stated patient complaining of new onset of inability to feel her lower extremities.  Nurse observed patient trying to ambulate but knees buckled and was unable to walk. States has already send patient to [facility] for further evaluation.Nurse states patient complained previously of lower back pain.

## 2022-12-04 NOTE — ED Triage Notes (Signed)
EMS reports from [facility] c/o decreased mobility x 1 1/2 weak. States that her knee buckles that comes and goes. Hx of osteoarthritis. Denies pain.  BP 176/102 HR 84 RR 14 Sp02 95 RA

## 2022-12-04 NOTE — ED Provider Notes (Signed)
[place] EMERGENCY DEPARTMENT AT [facility] Provider Note   CSN: [id number] Arrival date & time: 12/04/22  1821     History {Add pertinent medical, surgical, social history, OB history to HPI:1} Chief Complaint  Patient presents with  . Decreased mobility    Jessica Mullins is a 89 y.o. female with breast cancer, HTN, HLD, afib, orthostatic hypotension, h/o CVA, h/o meningioma of brain, who is BIBEMS reports from [facility] with concern for intermittent right lower extremity weakness that began several weeks ago. Patient has hard time delineating time frame or frequency, but states that it happens intermittently where she feels like her right leg doesn't work as well as the left one, buckles on her, doesn't respond as well, and feels numb or heavy. States that her knee buckles that comes and goes. Denies pain, recent falls or trauma, or any other focal neuro deficits including numbness tingling anywhere else, asymmetric weakness anywhere else, slurred speech, confusion, visual changes.  Denies any headache, urinary incontinence or retention, fever/chills, neck stiffness.  She states this is never happened to her before.  She states she does not feel the numbness or heaviness at this moment.  She normally walks with a walker.    HPI      Home Medications Prior to Admission medications   Medication Sig Start Date End Date Taking? Authorizing Provider  acetaminophen (TYLENOL) 325 MG tablet Take 325 mg by mouth every 6 (six) hours as needed for moderate pain.    [provider]  Biotin 5 MG CAPS Take 5 mg by mouth daily.     [provider]  Calcium Carbonate-Vitamin D 600-400 MG-UNIT tablet Take 1 tablet by mouth daily.    [provider]  Cholecalciferol (VITAMIN D3) 1000 UNITS CAPS Take 1,000 Units by mouth daily.    [provider]  cycloSPORINE (RESTASIS) 0.05 % ophthalmic emulsion Place 1 drop into both eyes 2 (two) times daily. 10/11/16    [provider]  ELIQUIS 5 MG TABS tablet TAKE ONE TABLET BY MOUTH TWICE DAILY 11/19/21   Loa Socks, NP  loperamide (IMODIUM A-D) 2 MG tablet Take 2 mg by mouth 4 (four) times daily as needed for diarrhea or loose stools.    [provider]  metoprolol succinate (TOPROL XL) 25 MG 24 hr tablet Take 25 mg every morning 06/08/22   Croitoru, Mihai, MD  Multiple Vitamins-Minerals (PRESERVISION AREDS) TABS Take 2 tablets by mouth daily.    [provider]  Omega-3 Fatty Acids (FISH OIL PO) Take 1 Capful by mouth daily.    [provider]  polyethylene glycol (MIRALAX / GLYCOLAX) 17 g packet Take 17 g by mouth once.    [provider]  pravastatin (PRAVACHOL) 40 MG tablet TAKE ONE TABLET BY MOUTH ONCE DAILY 08/05/22   Mahlon Gammon, MD  Psyllium (METAMUCIL PO) Take 1 Dose by mouth daily. 1 teaspoon    [provider]  sennosides-docusate sodium (SENOKOT-S) 8.6-50 MG tablet Take 2 tablets by mouth at bedtime as needed for constipation. 10/03/22   Fletcher Anon, NP  Turmeric 500 MG CAPS Take 1 capsule by mouth daily.    [provider]  vitamin B-12 (CYANOCOBALAMIN) 1000 MCG tablet Take 1 tablet (1,000 mcg total) by mouth daily. 07/19/18   Serena Croissant, MD      Allergies    Sulfa antibiotics, Tape, Wound dressing adhesive, Glucosamine forte [nutritional supplements], and Latex    Review of Systems   Review  of Systems Review of systems Negative for f/c.  A 10 point review of systems was performed and is negative unless otherwise reported in HPI.  Physical Exam Updated Vital Signs BP (!) 161/105   Pulse 67   Temp 98.3 F (36.8 C)   Resp 15   SpO2 94%  Physical Exam General: Normal appearing female, lying in bed.  HEENT: PERRLA, EOMI, Sclera anicteric, MMM, trachea midline. Tongue protrudes midline. Cardiology: RRR, no murmurs/rubs/gallops. BL radial and DP pulses equal bilaterally.  Resp: Normal respiratory rate and  effort. CTAB, no wheezes, rhonchi, crackles.  Abd: Soft, non-tender, non-distended. No rebound tenderness or guarding.  GU: Deferred. MSK: No peripheral edema or signs of trauma. Extremities without deformity or TTP. No cyanosis or clubbing. Skin: warm, dry.  Neuro: A&Ox4, CNs II-XII grossly intact. 4/5 strength in all extremities. Sensation grossly intact. Normal speech.  Psych: Normal mood and affect.   ED Results / Procedures / Treatments   Labs (all labs ordered are listed, but only abnormal results are displayed) Labs Reviewed  CBC WITH DIFFERENTIAL/PLATELET - Abnormal; Notable for the following components:      Result Value   Hemoglobin 15.3 (*)    All other components within normal limits  COMPREHENSIVE METABOLIC PANEL - Abnormal; Notable for the following components:   BUN 28 (*)    Creatinine, Ser 1.06 (*)    Total Protein 6.2 (*)    GFR, Estimated 50 (*)    All other components within normal limits  URINALYSIS, ROUTINE W REFLEX MICROSCOPIC - Abnormal; Notable for the following components:   Leukocytes,Ua SMALL (*)    All other components within normal limits  MAGNESIUM - Abnormal; Notable for the following components:   Magnesium 2.5 (*)    All other components within normal limits  RESP PANEL BY RT-PCR (RSV, FLU A&B, COVID)  RVPGX2  CBG MONITORING, ED  TROPONIN I (HIGH SENSITIVITY)  TROPONIN I (HIGH SENSITIVITY)    EKG EKG Interpretation  Date/Time:  Sunday Shivonne 09 2024 19:12:06 EDT Ventricular Rate:  67 PR Interval:    QRS Duration: 87 QT Interval:  356 QTC Calculation: 376 R Axis:   -6 Text Interpretation: Atrial fibrillation  Similar to prior Confirmed by Vivi Barrack [id number]) on 12/04/2022 10:00:33 PM  Radiology CT Head Wo Contrast  Result Date: 12/04/2022 CLINICAL DATA:  Stroke, acute neurological deficit. EXAM: CT HEAD WITHOUT CONTRAST TECHNIQUE: Contiguous axial images were obtained from the base of the skull through the vertex without intravenous  contrast. RADIATION DOSE REDUCTION: This exam was performed according to the departmental dose-optimization program which includes automated exposure control, adjustment of the mA and/or kV according to patient size and/or use of iterative reconstruction technique. COMPARISON:  07/04/2022 FINDINGS: Brain: Solid extra-axial mass within the right ventrolateral, inferior posterior fossa closely apposed to the dorsal clivus and extending into the foramen magnum demonstrates significant mass effect upon the brainstem, similar to prior examination. This mass appears stable in size measuring roughly 2.2 x 3.0 cm at axial image # 29/2. Mass effect results in effacement of the fourth ventricle, similar to prior examination, with stable mild ventriculomegaly involving the lateral and third ventricles which may reflect changes of secondary obstructing hydrocephalus. Mild periventricular white matter changes are again noted, likely reflecting the sequela of small vessel ischemia. No acute infarct or acute intracranial hemorrhage. No abnormal intra or extra-axial fluid collection. Vascular: No hyperdense vessel or unexpected calcification. Skull: Normal. Negative for fracture or focal lesion. Sinuses/Orbits: No acute finding. Other: Mastoid  air cells and middle ear cavities are clear. IMPRESSION: 1. No acute intracranial hemorrhage or infarct. 2. Stable extra-axial mass within the right ventrolateral, inferior posterior fossa closely apposed to the dorsal clivus and extending into the foramen magnum with significant mass effect upon the brainstem, similar to prior examination. Mass effect results in effacement of the fourth ventricle, with stable mild ventriculomegaly involving the lateral and third ventricles which may reflect changes of secondary obstructing hydrocephalus. 3. Mild senescent changes. Electronically Signed   By: Helyn Numbers M.D.   On: 12/04/2022 21:18    Procedures Procedures  {Document cardiac monitor,  telemetry assessment procedure when appropriate:1}  Medications Ordered in ED Medications - No data to display  ED Course/ Medical Decision Making/ A&P                          Medical Decision Making Amount and/or Complexity of Data Reviewed Labs: ordered. Decision-making details documented in ED Course. Radiology: ordered. Decision-making details documented in ED Course.    This patient presents to the ED for concern of ***, this involves an extensive number of treatment options, and is a complaint that carries with it a high risk of complications and morbidity.  I considered the following differential and admission for this acute, potentially life threatening condition.   MDM:    DDX for generalized weakness includes but is not limited to:  Infectious processes, severe metabolic derangements or electrolyte abnormalities, ischemia/ACS, heart failure, anemia, and intracranial/central processes but think these are unlikely given the history and physical exam. ***  Clinical Course as of 12/04/22 2350  Sun Dec 04, 2022  2039 CBC with Differential(!) C/w prior [HN]  2039 Glucose-Capillary: 95 [HN]  2039 Resp panel by RT-PCR (RSV, Flu A&B, Covid) Anterior Nasal Swab Neg [HN]  2039 Creatinine(!): 1.06 BL 0.8 [HN]  2039 Troponin I (High Sensitivity): 13 [HN]  2159 CT Head Wo Contrast 1. No acute intracranial hemorrhage or infarct. 2. Stable extra-axial mass within the right ventrolateral, inferior posterior fossa closely apposed to the dorsal clivus and extending into the foramen magnum with significant mass effect upon the brainstem, similar to prior examination. Mass effect results in effacement of the fourth ventricle, with stable mild ventriculomegaly involving the lateral and third ventricles which may reflect changes of secondary obstructing hydrocephalus. 3. Mild senescent changes.   [HN]  2200 Consulting with neurology [HN]  2207 Consulted to neurosurgery and  neurology. D/w neurology who recommends CTA H&N. Dr. Amada Jupiter states that it would be unusual for a stable mass to acutely cause this problem, and if neurosurgery states there is no way that her mass is causing her symptoms, then MRI would be the next step. [HN]  2243 D/w Dr. Lovell Sheehan, who states that her tumor is significantly large and could explain her weakness. He states the obstructive hydrocephalus is not that severe, could explain her weakness. Will move forward with CTA H&N to r/o vascular cause and can f/u with neurology.  [HN]    Clinical Course User Index [HN] Loetta Rough, MD    Labs: I Ordered, and personally interpreted labs.  The pertinent results include:  those listed above  Imaging Studies ordered: I ordered imaging studies including CTH I independently visualized and interpreted imaging. I agree with the radiologist interpretation  Additional history obtained from ***.  External records from outside source obtained and reviewed including ***  Cardiac Monitoring: .The patient was maintained on a cardiac monitor.  I  personally viewed and interpreted the cardiac monitored which showed an underlying rhythm of: ***  Reevaluation: After the interventions noted above, I reevaluated the patient and found that they have :{resolved/improved/worsened:23923::"improved"}  Social Determinants of Health: .***  Disposition:  ***  Co morbidities that complicate the patient evaluation . Past Medical History:  Diagnosis Date  . Allergy   . Ankle fracture, left   . Atrial fibrillation [facility])    Per [facility] New Patient Packet   . Breast cancer (HCC) 1115/16   left   . Bursitis of right shoulder   . Cataract   . Family history of cancer   . Fracture of right wrist   . GERD (gastroesophageal reflux disease)   . Hyperlipidemia   . Hypertension   . Lumbar radiculitis    Per [facility] New Patient Packet   . Meningioma [facility])    Per [facility] New Patient Packet   . Skin cancer 2000    melanoma and basal cell  . Stress fracture    Right Heel, Per [facility] New Patient Packet   . TIA (transient ischemic attack) 2022  . Vaso vagal episode    during preparation for colonoscopy     Medicines No orders of the defined types were placed in this encounter.   I have reviewed the patients home medicines and have made adjustments as needed  Problem List / ED Course: Problem List Items Addressed This Visit   None        {Document critical care time when appropriate:1} {Document review of labs and clinical decision tools ie heart score, Chads2Vasc2 etc:1}  {Document your independent review of radiology images, and any outside records:1} {Document your discussion with family members, caretakers, and with consultants:1} {Document social determinants of health affecting pt's care:1} {Document your decision making why or why not admission, treatments were needed:1}  This note was created using dictation software, which may contain spelling or grammatical errors.

## 2022-12-05 ENCOUNTER — Encounter (HOSPITAL_COMMUNITY): Payer: Self-pay | Admitting: Radiology

## 2022-12-05 ENCOUNTER — Emergency Department (HOSPITAL_COMMUNITY): Payer: Medicare PPO

## 2022-12-05 DIAGNOSIS — Z7401 Bed confinement status: Secondary | ICD-10-CM | POA: Diagnosis not present

## 2022-12-05 DIAGNOSIS — R531 Weakness: Secondary | ICD-10-CM | POA: Diagnosis not present

## 2022-12-05 DIAGNOSIS — I63323 Cerebral infarction due to thrombosis of bilateral anterior cerebral arteries: Secondary | ICD-10-CM | POA: Diagnosis not present

## 2022-12-05 NOTE — ED Notes (Addendum)
Pt taken to restroom w/ sara-steady. Pt advises she needs a minute. Instructed pt to use call cord when she is finished. Pt understood and agrees.

## 2022-12-05 NOTE — ED Notes (Signed)
Attempted to call report to facility, no answer.  

## 2022-12-05 NOTE — ED Provider Notes (Signed)
I assumed care of this patient from previous provider.  Please see their note for further details of history, exam, and MDM.   Briefly patient is a 89 y.o. female who presented intermittent paresthesias. Known brain tumor noted on CT head. NSU and neurology consulted who recommended CTA.  CTA w/o significant occlusions. Incidental findings mentioned to patient who was already aware of lung nodules.  The patient appears reasonably screened and/or stabilized for discharge and I doubt any other medical condition or other [facility] requiring further screening, evaluation, or treatment in the ED at this time. I have discussed the findings, Dx and Tx plan with the patient/family who expressed understanding and agree(s) with the plan. Discharge instructions discussed at length. The patient/family was given strict return precautions who verbalized understanding of the instructions. No further questions at time of discharge.  Disposition: Discharge  Condition: Good  ED Discharge Orders     None        Follow Up: Mahlon Gammon, MD [address] [place] [address] [phone]  Call  to schedule an appointment for close follow up  Drema Dallas, DO [address] [place] [address] [phone]  Call            Nira Conn, MD 12/05/22 [phone]

## 2022-12-06 ENCOUNTER — Non-Acute Institutional Stay: Payer: Medicare PPO | Admitting: Orthopedic Surgery

## 2022-12-06 ENCOUNTER — Encounter: Payer: Self-pay | Admitting: Orthopedic Surgery

## 2022-12-06 DIAGNOSIS — R202 Paresthesia of skin: Secondary | ICD-10-CM | POA: Diagnosis not present

## 2022-12-06 DIAGNOSIS — D329 Benign neoplasm of meninges, unspecified: Secondary | ICD-10-CM

## 2022-12-06 DIAGNOSIS — R42 Dizziness and giddiness: Secondary | ICD-10-CM | POA: Diagnosis not present

## 2022-12-06 NOTE — Progress Notes (Unsigned)
Location:  [facility] Room Number: 621-A Place of Service:  ALF [phone]) Provider: Hazle Nordmann, NP  Code Status: FULL CODE Goals of Care:     12/06/2022    1:48 PM  Advanced Directives  Does Jessica Mullins Have a Medical Advance Directive? Yes  Type of Estate agent of [place];Living will  Does Jessica Mullins want to make changes to medical advance directive? No - Jessica Mullins declined  Copy of Healthcare Power of Attorney in Chart? Yes - validated most recent copy scanned in chart (See row information)     Chief Complaint  Jessica Mullins presents with   Hospitalization Follow-up    ED Follow Up.     HPI: Jessica Mullins is a 89 y.o. female seen today for hospital follow-up s/p admission from   Past Medical History:  Diagnosis Date   Allergy    Ankle fracture, left    Atrial fibrillation (HCC)    Per [facility] Mull[facility]    Breast cancer (HCC) 1115/16   left    Bursitis of right shoulder    Cataract    Family history of cancer    Fracture of right wrist    GERD (gastroesophageal reflux disease)    Hyperlipidemia    Hypertension    Lumbar radiculitis    Per [facility]    Meningioma [facility])    Per [facility]    Skin cancer 2000   melanoma and basal cell   Stress fracture    Right Heel, Per [facility] Jessica Mull[facility] Packet    TIA (transient ischemic attack) 2022   Vaso vagal episode    during preparation for colonoscopy    Past Surgical History:  Procedure Laterality Date   ABDOMINAL HYSTERECTOMY  1988   TAH/BSO--FIBROIDS   APPENDECTOMY     BREAST LUMPECTOMY     B/L--[facility]   BREAST LUMPECTOMY WITH RADIOACTIVE SEED AND SENTINEL LYMPH NODE BIOPSY Left 06/11/2015   Procedure: LEFT BREAST LUMPECTOMY WITH RADIOACTIVE SEED AND LEFT SENTINEL LYMPH NODE MAPPING;  Surgeon: Harriette Bouillon, MD;  Location: [facility] OR;  Service: General;  Laterality: Left;   CARDIOVERSION N/A 01/11/2022   Procedure: CARDIOVERSION;  Surgeon: Jodelle Red, MD;  Location: [facility] ENDOSCOPY;  Service: Cardiovascular;  Laterality: N/A;   CATARACT EXTRACTION  2015   COLONOSCOPY  2010   DG  BONE DENSITY ([facility] HX)     EYE SURGERY Bilateral    cataracts   fiberadenoma Bilateral 1978, 1980   GANGLION CYST EXCISION     L  hand   Lipiflow procedure      Allergies  Allergen Reactions   Sulfa Antibiotics Other (See Comments)    As a child, rigid as a stick and not responsive   Tape Other (See Comments)    Blisters, Please use "paper" tape only for short periods of time   Wound Dressing Adhesive Other (See Comments)    Blisters, Please use "paper" tape only for short periods of time, Blisters, Please use "paper" tape only for short periods of time   Glucosamine Forte [Nutritional Supplements] Rash   Latex Rash    Outpatient Encounter Medications as of 12/06/2022  Medication Sig   acetaminophen (TYLENOL) 325 MG tablet Take 325 mg by mouth every 6 (six) hours as needed for moderate pain.   Biotin 5 MG CAPS Take 5 mg by mouth daily.    Calcium Carbonate-Vitamin D 600-400 MG-UNIT tablet Take 1 tablet by mouth daily.   Cholecalciferol (VITAMIN  D3) 1000 UNITS CAPS Take 1,000 Units by mouth daily.   cycloSPORINE (RESTASIS) 0.05 % ophthalmic emulsion Place 1 drop into both eyes 2 (two) times daily.   ELIQUIS 5 MG TABS tablet TAKE ONE TABLET BY MOUTH TWICE DAILY   loperamide (IMODIUM A-D) 2 MG tablet Take 2 mg by mouth 4 (four) times daily as needed for diarrhea or loose stools.   metoprolol succinate (TOPROL XL) 25 MG 24 hr tablet Take 25 mg every morning   Multiple Vitamins-Minerals (PRESERVISION AREDS) TABS Take 2 tablets by mouth daily.   nystatin cream (MYCOSTATIN) Apply 1 Application topically as needed for dry skin.   Omega-3 Fatty Acids (FISH OIL PO) Take 1 Capful by mouth daily.   polyethylene glycol (MIRALAX / GLYCOLAX) 17 g packet Take 17 g by mouth once.   pravastatin (PRAVACHOL) 40 MG tablet TAKE ONE TABLET BY MOUTH ONCE DAILY    sennosides-docusate sodium (SENOKOT-S) 8.6-50 MG tablet Take 2 tablets by mouth at bedtime as needed for constipation.   sodium chloride (MURO 128) 5 % ophthalmic ointment Place 1 Application into both eyes at bedtime.   Turmeric 500 MG CAPS Take 1 capsule by mouth daily.   vitamin B-12 (CYANOCOBALAMIN) 1000 MCG tablet Take 1 tablet (1,000 mcg total) by mouth daily.   [DISCONTINUED] Psyllium (METAMUCIL PO) Take 1 Dose by mouth daily. 1 teaspoon   No facility-administered encounter medications on file as of 12/06/2022.    Review of Systems:  Review of Systems  Health Maintenance  Topic Date Due   Medicare Annual Wellness (AWV)  12/23/2022 (Originally 11/13/2020)   COVID-19 Vaccine (6 - 2023-24 season) 02/15/2023 (Originally 06/22/2022)   INFLUENZA VACCINE  01/26/2023   MAMMOGRAM  07/12/2023   DTaP/Tdap/Td (5 - Td or Tdap) 10/24/2030   Pneumonia Vaccine 31+ Years old  Completed   DEXA SCAN  Completed   Zoster Vaccines- Shingrix  Completed   HPV VACCINES  Aged Out    Physical Exam: Vitals:   12/06/22 1342  BP: (!) 150/88  Pulse: 73  Resp: 18  Temp: 98.2 F (36.8 C)  SpO2: 97%  Weight: 139 lb 3.2 oz (63.1 kg)  Height: 5' (1.524 m)   Body mass index is 27.19 kg/m. Physical Exam  Labs reviewed: Basic Metabolic Panel: Recent Labs    01/06/22 1026 04/21/22 0000 07/04/22 1219 07/07/22 0000 08/04/22 0000 12/04/22 1906  NA 142   < > 139  --  143 141  K 4.7   < > 3.9  --  4.2 4.1  CL 103   < > 103  --  102 106  CO2 28   < > 29  --  28* 26  GLUCOSE 87  --  99  --   --  96  BUN 16   < > 14  --  16 28*  CREATININE 0.77   < > 0.80  --  0.8 1.06*  CALCIUM 10.1   < > 9.3  --  10.1 9.6  MG  --   --   --   --   --  2.5*  TSH  --   --   --  1.92  --   --    < > = values in this interval not displayed.   Liver Function Tests: Recent Labs    04/21/22 0000 07/04/22 1219 12/04/22 1906  AST 25 31 28   ALT 31 53* 27  ALKPHOS 68 69 59  BILITOT  --  1.2 1.1  PROT  --  6.1*  6.2*  ALBUMIN 4.0 3.8 3.8   No results for input(s): "LIPASE", "AMYLASE" in the last 8760 hours. No results for input(s): "AMMONIA" in the last 8760 hours. CBC: Recent Labs    01/06/22 1026 04/21/22 0000 07/04/22 1219 08/04/22 0000 12/04/22 1906  WBC 7.6   < > 8.0 5.7 8.5  NEUTROABS  --   --   --   --  5.8  HGB 14.8   < > 15.9* 15.8 15.3*  HCT 44.5   < > 47.0* 47* 45.3  MCV 92  --  91.1  --  92.1  PLT 226   < > 198 223 191   < > = values in this interval not displayed.   Lipid Panel: Recent Labs    02/10/22 0000 04/21/22 0000 08/04/22 0000  CHOL 142 122 152  HDL 62 58 63  LDLCALC 64 50 70  TRIG 81 66 91   Lab Results  Component Value Date   HGBA1C 4.8 05/11/2021    Procedures since last visit: CT ANGIO HEAD NECK W WO CM  Result Date: 12/05/2022 CLINICAL DATA:  Initial evaluation for neuro deficit, stroke. EXAM: CT ANGIOGRAPHY HEAD AND NECK WITH AND WITHOUT CONTRAST TECHNIQUE: Multidetector CT imaging of the head and neck was performed using the standard protocol during bolus administration of intravenous contrast. Multiplanar CT image reconstructions and MIPs were obtained to evaluate the vascular anatomy. Carotid stenosis measurements (when applicable) are obtained utilizing NASCET criteria, using the distal internal carotid diameter as the denominator. RADIATION DOSE REDUCTION: This exam was performed according to the departmental dose-optimization program which includes automated exposure control, adjustment of the mA and/or kV according to Jessica Mullins size and/or use of iterative reconstruction technique. CONTRAST:  75mL OMNIPAQUE IOHEXOL 350 MG/ML SOLN COMPARISON:  CT from 12/04/2022. FINDINGS: CTA NECK FINDINGS Aortic arch: Visualized aortic arch normal in caliber with standard 3 vessel morphology. Atheromatous change about the arch itself. No significant stenosis about the origin the great vessels. Right carotid system: Right common and internal carotid arteries are patent  without dissection. Mild atheromatous change about the right carotid bulb without hemodynamically significant greater than 50% stenosis. Left carotid system: Left common and internal carotid arteries are patent without evidence for dissection. Minor atheromatous change about the left carotid bulb without hemodynamically significant greater than 50% stenosis. Vertebral arteries: Both vertebral arteries arise from the subclavian arteries. No proximal subclavian artery stenosis. Left vertebral artery slightly dominant. Vertebral arteries are patent without stenosis or dissection. Skeleton: No discrete or worrisome osseous lesions. Moderately advanced cervical spondylosis at C3-4 through C6-7. Other neck: No other acute abnormality within the neck. Upper chest: Scattered interlobular septal thickening within the visualized lungs, consistent with pulmonary interstitial edema. Associated small layering bilateral pleural effusions. Few small nodules are seen clustered together at the peripheral right upper lobe, largest of which measures 5 mm (series 6, image 11), indeterminate. Review of the MIP images confirms the above findings CTA HEAD FINDINGS Anterior circulation: Mild atheromatous change about the carotid siphons without hemodynamically significant stenosis. A1 segments, anterior tibial artery complex common anterior cerebral arteries patent without stenosis. No M1 stenosis or occlusion. No proximal MCA branch occlusion or high-grade stenosis. Distal MCA branches perfused and symmetric. Posterior circulation: Both V4 segments patent without stenosis. Intradural right V4 segment is displaced posteriorly due to the extra-axial mass at the right skull base. Both PICA patent. Basilar patent without stenosis. Superior cerebellar and posterior cerebral arteries patent bilaterally. Venous sinuses: Grossly patent allowing for  timing the contrast bolus. Anatomic variants: None significant.  No aneurysm. Review of the MIP  images confirms the above findings IMPRESSION: 1. Negative CTA for large vessel occlusion or other emergent finding. 2. Mild atheromatous change about the carotid bifurcations and carotid siphons without hemodynamically significant or correctable stenosis. 3. Mild pulmonary interstitial edema with small layering bilateral pleural effusions. 4. Few small nodules clustered together at the peripheral right upper lobe, largest of which measures 5 mm, indeterminate. Per Fleischner Society Guidelines, if Jessica Mullins is low risk for malignancy, no routine follow-up imaging is recommended. If Jessica Mullins is high risk for malignancy, a non-contrast Chest CT at 12 months is optional. If performed and the nodule is stable at 12 months, no further follow-up is recommended. These guidelines do not apply to immunocompromised patients and patients with cancer. Follow up in patients with significant comorbidities as clinically warranted. For lung cancer screening, adhere to Lung-RADS guidelines. Reference: Radiology. 2017; 284(1):228-43. Electronically Signed   By: Rise Mu M.D.   On: 12/05/2022 01:29   CT Head Wo Contrast  Result Date: 12/04/2022 CLINICAL DATA:  Stroke, acute neurological deficit. EXAM: CT HEAD WITHOUT CONTRAST TECHNIQUE: Contiguous axial images were obtained from the base of the skull through the vertex without intravenous contrast. RADIATION DOSE REDUCTION: This exam was performed according to the departmental dose-optimization program which includes automated exposure control, adjustment of the mA and/or kV according to Jessica Mullins size and/or use of iterative reconstruction technique. COMPARISON:  07/04/2022 FINDINGS: Brain: Solid extra-axial mass within the right ventrolateral, inferior posterior fossa closely apposed to the dorsal clivus and extending into the foramen magnum demonstrates significant mass effect upon the brainstem, similar to prior examination. This mass appears stable in size measuring  roughly 2.2 x 3.0 cm at axial image # 29/2. Mass effect results in effacement of the fourth ventricle, similar to prior examination, with stable mild ventriculomegaly involving the lateral and third ventricles which may reflect changes of secondary obstructing hydrocephalus. Mild periventricular white matter changes are again noted, likely reflecting the sequela of small vessel ischemia. No acute infarct or acute intracranial hemorrhage. No abnormal intra or extra-axial fluid collection. Vascular: No hyperdense vessel or unexpected calcification. Skull: Normal. Negative for fracture or focal lesion. Sinuses/Orbits: No acute finding. Other: Mastoid air cells and middle ear cavities are clear. IMPRESSION: 1. No acute intracranial hemorrhage or infarct. 2. Stable extra-axial mass within the right ventrolateral, inferior posterior fossa closely apposed to the dorsal clivus and extending into the foramen magnum with significant mass effect upon the brainstem, similar to prior examination. Mass effect results in effacement of the fourth ventricle, with stable mild ventriculomegaly involving the lateral and third ventricles which may reflect changes of secondary obstructing hydrocephalus. 3. Mild senescent changes. Electronically Signed   By: Helyn Numbers M.D.   On: 12/04/2022 21:18    Assessment/Plan There are no diagnoses linked to this encounter.   Labs/tests ordered:  * No order type specified * Next appt:  02/01/2023

## 2022-12-06 NOTE — Progress Notes (Unsigned)
NEUROLOGY FOLLOW UP OFFICE NOTE  Jessica Mullins [id number]  Assessment/Plan:   Intermittent right leg numbness and weakness as well as left leg weakness.  Patient is poor historian.  Diff dx includes CVA vs lumbar stenosis/radiculopathy vs posterior fossa meningioma.  There is no LVO or significant stenosis on CTA.  She is already on Eliquis, and therefore further stroke workup would likely not change management.  To establish a diagnosis, will check MRI of brain to evaluate for recent stroke, although wouldn't change management.   Right-sided posterior fossa meningioma with mass effect on the brainstem - stable.  Surgery not an option as per neurosurgery Paroxysmal atrial fibrillation on AC Left shoulder pain    MRI of brain Further recommendations pending results. Recommend PCP evaluate left shoulder pain.   Subjective:  Jessica Mullins is an 89 year old right-handed female with paroxysmal atrial fibrillation, hypertension, hyperlipidemia, and history of orthostatic hypotension, melanoma and breast cancer who follows up for recent stroke.  History supplemented by hospital records.  UPDATE: Last year, she had ongoing dizziness.  Seen in the ED on 11/04/2021 where MRI of brain personally reviewed showed stable meningioma in the posterior fossa with mass effect on the medulla and brainstem on the right and mild to moderate chronic cerebrovascular disease but no acute findings.  Followed up with cardiology.  Heart monitor caught a fib.  Underwent cardioversion on 01/11/2022.  Dizziness continued.  Saw Dr. Terrace Arabia of [facility] who stated dizziness may possibly be from the posterior fossa meningioma.    She reports intermittent right lower extremity numbness and weakness for several months.  No back pain.  Sometimes has right leg pain.  Seen in the ED on 6/9.  Evaluated for possible stroke.  CT head showed stable right ventrolateral, inferior posterior fossa meningoma with significant  mass effect on upon the brainstem. CTA head and neck showed mild atheromatous changes at the carotid bifurcations but no LVO or hemodynamically significant stenosis.  She reports her memory is worse since then.  Also she says she has trouble with her left leg.  "It doesn't go where I want it to".  Sometimes it shakes.  UA showed small leukocytes but otherwise was unremarkable.    She does have longstanding history of low back pain.  NCV-EMG of the lower extremities on 10/14/2020 showed bilateral chronic L3-4 radiculopathy but progressed compared to prior study on 02/27/2018.  No evidence of polyneuropathy or myopathy.  Follow up MRI of lumbar spine on 10/22/2020 demonstrated advanced lumbar spine degeneration with progressed spinal stenosis at L3-4 with right greater than left foraminal impingement, moderate spinal stenosis at L4-5 with impingement of both L5 nerve roots and severe right foraminal impingement, and left L5-S1 left foraminal impingement.    HISTORY: Reports balance problems after treatment for breast cancer with chemotherapy and radiation in 2016.  Always feels off-balance.  Legs feel like "rubber" and heavy.  Saw patient in 2019.  Denied low back and leg pain at that time.  Had evidence of neuropathy in the feet. NCV-EMG on 02/27/2018 showed mild chronic L3-4 radiculopathy bilaterally but no evidence of large fiber polyneuropathy. MRI of lumbar spine on 03/22/2018 showed degenerative changes with moderate spinal stenosis at L3-4 and L4-5 and with right greater than left L4-5 foraminal stenosis possibly affecting right L4 nerve and at L5-S1 possibly compressing left L5 nerve.  Physical therapy was recommended.  In 2020, she developed right buttock pain radiating down right leg with right leg weakness.  MRI of lumbar spine on 05/29/2019 personally reviewed stable compared to 2019.  She underwent right L4 nerve epidural which was effective.  In April 2021, she tripped and struck the back of her head.  MRI  of brain with and without contrast on 10/05/2019 personally reviewed showed 2.6 x 2 x 2.7 cm meningioma compressing the right anteriolateral medulla and craniocervical junction without parenchymal edema.  She saw neurosurgery who did not advise surgical intervention.  She then moved to [employer].  She started taking part in exercises.  Balance was somewhat improved and did not fall much.  However, her back would act up and she would need to stop exercising.  Balance problems remain her main concern.  NCV-EMG of the lower extremities on 10/14/2020 again showed bilateral chronic L3-4 radiculopathy but progressed compared to prior study on 02/27/2018.  No evidence of polyneuropathy or myopathy.  Follow up MRI of lumbar spine on 10/22/2020 personally reviewed demonstrated advanced lumbar spine degeneration with progressed spinal stenosis at L3-4 with right greater than left foraminal impingement, moderate spinal stenosis at L4-5 with impingement of both L5 nerve roots and severe right foraminal impingement, and left L5-S1 left foraminal impingement.  Although she has some low back pain she does not have any radicular pain down the legs.    In May 2022, she started experiencing visual disturbance.  If she looks at something, such as a flower, the petals will suddenly change position and jump around with change in shade of the colors.  It can last a couple of minutes up to an hour.  It would occur daily to every other day for about a week.  No associated vision loss, headache, phantosmia, epigastric rising/nausea, numbness or focal weakness.  It has not occurred while reading, watching TV or while driving.  She saw ophthalmology who did not note any significant findings on exam. She has not had an episode for about a week but had mild symptoms while looking at a painting while sitting in the waiting room.  Denies history of headaches/migraines or seizures.  MRI of brain with and without contrast was performed on 12/10/2020  was stable.  She continued having the visual symptoms.  Since last visit, she began having intermittent dizziness and word-finding difficulty for several weeks.  On 05/08/2021 she began having intermittent double vision.  She was admitted to [facility] on 05/10/2021 for further evaluation.  MRI of brain showed a punctate acute infarct in the anterior left parietal lobe cortex.  Meningioma was stable.  CTA head and neck personally reviewed showed no LVO or hemodynamically significant stenosis.  Echocardiogram 55-60% with no cardiac source of embolus.  LDL 70 and Hgb A1c 4.8.  She was continued on current medications.  Semiology of visual symptoms seemed consistent with ocular migraine, although patient denies prior history of migraines.    PAST MEDICAL HISTORY: Past Medical History:  Diagnosis Date   Allergy    Ankle fracture, left    Atrial fibrillation (HCC)    Per [facility] New Patient Packet    Breast cancer (HCC) 1115/16   left    Bursitis of right shoulder    Cataract    Family history of cancer    Fracture of right wrist    GERD (gastroesophageal reflux disease)    Hyperlipidemia    Hypertension    Lumbar radiculitis    Per [facility] New Patient Packet    Meningioma [facility])    Per [facility] New Patient Packet    Skin  cancer 2000   melanoma and basal cell   Stress fracture    Right Heel, Per [facility] New Patient Packet    TIA (transient ischemic attack) 2022   Vaso vagal episode    during preparation for colonoscopy    MEDICATIONS: Current Outpatient Medications on File Prior to Visit  Medication Sig Dispense Refill   acetaminophen (TYLENOL) 325 MG tablet Take 325 mg by mouth every 6 (six) hours as needed for moderate pain.     Biotin 5 MG CAPS Take 5 mg by mouth daily.      Calcium Carbonate-Vitamin D 600-400 MG-UNIT tablet Take 1 tablet by mouth daily.     Cholecalciferol (VITAMIN D3) 1000 UNITS CAPS Take 1,000 Units by mouth daily.     cycloSPORINE (RESTASIS) 0.05 % ophthalmic emulsion  Place 1 drop into both eyes 2 (two) times daily.     ELIQUIS 5 MG TABS tablet TAKE ONE TABLET BY MOUTH TWICE DAILY 60 tablet 12   loperamide (IMODIUM A-D) 2 MG tablet Take 2 mg by mouth 4 (four) times daily as needed for diarrhea or loose stools.     metoprolol succinate (TOPROL XL) 25 MG 24 hr tablet Take 25 mg every morning 90 tablet 3   Multiple Vitamins-Minerals (PRESERVISION AREDS) TABS Take 2 tablets by mouth daily.     Omega-3 Fatty Acids (FISH OIL PO) Take 1 Capful by mouth daily.     polyethylene glycol (MIRALAX / GLYCOLAX) 17 g packet Take 17 g by mouth once.     pravastatin (PRAVACHOL) 40 MG tablet TAKE ONE TABLET BY MOUTH ONCE DAILY 90 tablet 1   Psyllium (METAMUCIL PO) Take 1 Dose by mouth daily. 1 teaspoon     sennosides-docusate sodium (SENOKOT-S) 8.6-50 MG tablet Take 2 tablets by mouth at bedtime as needed for constipation. 60 tablet 0   Turmeric 500 MG CAPS Take 1 capsule by mouth daily.     vitamin B-12 (CYANOCOBALAMIN) 1000 MCG tablet Take 1 tablet (1,000 mcg total) by mouth daily.     No current facility-administered medications on file prior to visit.    ALLERGIES: Allergies  Allergen Reactions   Sulfa Antibiotics Other (See Comments)    As a child, rigid as a stick and not responsive   Tape Other (See Comments)    Blisters, Please use "paper" tape only for short periods of time   Wound Dressing Adhesive Other (See Comments)    Blisters, Please use "paper" tape only for short periods of time, Blisters, Please use "paper" tape only for short periods of time   Glucosamine Forte [Nutritional Supplements] Rash   Latex Rash    FAMILY HISTORY: Family History  Problem Relation Age of Onset   Cancer Mother        mets to bone--? primary   Coronary artery disease Brother        died of M! @ 65   Diabetes Brother    Hyperlipidemia Brother    Hypertension Brother    Heart disease Brother        chf   Heart disease Father 24       MI   Lung cancer Sister 43        former smoker   Diabetes Maternal Aunt    Lung cancer Other    Thrombosis Other        thromboembolism clotting disorder--? father died of ? clot    Hypertension Son    High Cholesterol Son  Objective:  Blood pressure 130/88, pulse 63, height 5\' 1"  (1.549 m), weight 154 lb (69.9 kg), SpO2 97 %. General: No acute distress.  Patient appears well-groomed.   Head:  Normocephalic/atraumatic Eyes:  Fundi examined but not visualized Neck: supple, no paraspinal tenderness, full range of motion Heart:  Regular rate and rhythm Back: No paraspinal tenderness Neurological Exam: alert and oriented to person, place, and time.  Speech fluent and not dysarthric, language intact.  CN II-XII intact. Bulk and tone normal, muscle strength 4/5 left arm abduction (related to shoulder pain).  Otherwise, 5/5 throughout.  Sensation to pinprick inconsistent on exam.  Vibratory sensation reduced in feet. Deep tendon reflexes absent throughout, toes downgoing.  Finger to nose testing intact.  Broad-based gait.     Shon Millet, DO  CC: Einar Crow, MD

## 2022-12-07 ENCOUNTER — Ambulatory Visit: Payer: Medicare PPO | Admitting: Neurology

## 2022-12-07 ENCOUNTER — Encounter: Payer: Self-pay | Admitting: Neurology

## 2022-12-07 VITALS — BP 139/63 | HR 85 | Ht 61.0 in | Wt 139.0 lb

## 2022-12-07 DIAGNOSIS — R29898 Other symptoms and signs involving the musculoskeletal system: Secondary | ICD-10-CM | POA: Diagnosis not present

## 2022-12-07 DIAGNOSIS — Z86011 Personal history of benign neoplasm of the brain: Secondary | ICD-10-CM | POA: Diagnosis not present

## 2022-12-07 DIAGNOSIS — R410 Disorientation, unspecified: Secondary | ICD-10-CM | POA: Diagnosis not present

## 2022-12-07 NOTE — Patient Instructions (Addendum)
Will check MRI of brain.   Recommend PCP evaluate left shoulder pain   A referral to [facility] has been placed for your MRI someone will contact you directly to schedule your appt. They are located at [address] [facility]. Please contact them directly by calling [phone] with any questions regarding your referral.

## 2022-12-11 ENCOUNTER — Inpatient Hospital Stay (HOSPITAL_COMMUNITY): Payer: Medicare PPO

## 2022-12-11 ENCOUNTER — Inpatient Hospital Stay (HOSPITAL_COMMUNITY)
Admission: EM | Admit: 2022-12-11 | Discharge: 2022-12-13 | DRG: 071 | Disposition: A | Discharge status: Home Health | Payer: Medicare PPO | Source: Skilled Nursing Facility | Attending: Student in an Organized Health Care Education/Training Program | Admitting: Student in an Organized Health Care Education/Training Program

## 2022-12-11 ENCOUNTER — Emergency Department (HOSPITAL_COMMUNITY): Payer: Medicare PPO

## 2022-12-11 ENCOUNTER — Encounter (HOSPITAL_COMMUNITY): Payer: Self-pay | Admitting: Internal Medicine

## 2022-12-11 DIAGNOSIS — R531 Weakness: Secondary | ICD-10-CM | POA: Diagnosis not present

## 2022-12-11 DIAGNOSIS — G9340 Encephalopathy, unspecified: Secondary | ICD-10-CM | POA: Diagnosis present

## 2022-12-11 DIAGNOSIS — R262 Difficulty in walking, not elsewhere classified: Secondary | ICD-10-CM | POA: Diagnosis not present

## 2022-12-11 DIAGNOSIS — Z85828 Personal history of other malignant neoplasm of skin: Secondary | ICD-10-CM

## 2022-12-11 DIAGNOSIS — I480 Paroxysmal atrial fibrillation: Secondary | ICD-10-CM | POA: Diagnosis present

## 2022-12-11 DIAGNOSIS — Z79899 Other long term (current) drug therapy: Secondary | ICD-10-CM | POA: Diagnosis not present

## 2022-12-11 DIAGNOSIS — Z83438 Family history of other disorder of lipoprotein metabolism and other lipidemia: Secondary | ICD-10-CM

## 2022-12-11 DIAGNOSIS — G9389 Other specified disorders of brain: Secondary | ICD-10-CM | POA: Diagnosis not present

## 2022-12-11 DIAGNOSIS — Z8249 Family history of ischemic heart disease and other diseases of the circulatory system: Secondary | ICD-10-CM

## 2022-12-11 DIAGNOSIS — D320 Benign neoplasm of cerebral meninges: Secondary | ICD-10-CM | POA: Diagnosis not present

## 2022-12-11 DIAGNOSIS — Z882 Allergy status to sulfonamides status: Secondary | ICD-10-CM

## 2022-12-11 DIAGNOSIS — R400 Somnolence: Secondary | ICD-10-CM | POA: Diagnosis not present

## 2022-12-11 DIAGNOSIS — Z85820 Personal history of malignant melanoma of skin: Secondary | ICD-10-CM | POA: Diagnosis not present

## 2022-12-11 DIAGNOSIS — R519 Headache, unspecified: Secondary | ICD-10-CM | POA: Diagnosis not present

## 2022-12-11 DIAGNOSIS — G911 Obstructive hydrocephalus: Secondary | ICD-10-CM | POA: Diagnosis present

## 2022-12-11 DIAGNOSIS — Z833 Family history of diabetes mellitus: Secondary | ICD-10-CM | POA: Diagnosis not present

## 2022-12-11 DIAGNOSIS — Z888 Allergy status to other drugs, medicaments and biological substances status: Secondary | ICD-10-CM

## 2022-12-11 DIAGNOSIS — E785 Hyperlipidemia, unspecified: Secondary | ICD-10-CM | POA: Diagnosis present

## 2022-12-11 DIAGNOSIS — I4891 Unspecified atrial fibrillation: Secondary | ICD-10-CM | POA: Diagnosis not present

## 2022-12-11 DIAGNOSIS — R42 Dizziness and giddiness: Secondary | ICD-10-CM | POA: Diagnosis not present

## 2022-12-11 DIAGNOSIS — Z853 Personal history of malignant neoplasm of breast: Secondary | ICD-10-CM

## 2022-12-11 DIAGNOSIS — Z801 Family history of malignant neoplasm of trachea, bronchus and lung: Secondary | ICD-10-CM | POA: Diagnosis not present

## 2022-12-11 DIAGNOSIS — Z66 Do not resuscitate: Secondary | ICD-10-CM | POA: Diagnosis not present

## 2022-12-11 DIAGNOSIS — I10 Essential (primary) hypertension: Secondary | ICD-10-CM | POA: Diagnosis present

## 2022-12-11 DIAGNOSIS — R410 Disorientation, unspecified: Secondary | ICD-10-CM | POA: Diagnosis not present

## 2022-12-11 DIAGNOSIS — Z91040 Latex allergy status: Secondary | ICD-10-CM | POA: Diagnosis not present

## 2022-12-11 DIAGNOSIS — G9349 Other encephalopathy: Secondary | ICD-10-CM | POA: Diagnosis not present

## 2022-12-11 DIAGNOSIS — R4182 Altered mental status, unspecified: Secondary | ICD-10-CM | POA: Diagnosis not present

## 2022-12-11 DIAGNOSIS — Z8673 Personal history of transient ischemic attack (TIA), and cerebral infarction without residual deficits: Secondary | ICD-10-CM

## 2022-12-11 DIAGNOSIS — I639 Cerebral infarction, unspecified: Secondary | ICD-10-CM | POA: Diagnosis not present

## 2022-12-11 DIAGNOSIS — R270 Ataxia, unspecified: Secondary | ICD-10-CM | POA: Diagnosis not present

## 2022-12-11 DIAGNOSIS — Z7901 Long term (current) use of anticoagulants: Secondary | ICD-10-CM | POA: Diagnosis not present

## 2022-12-11 LAB — RAPID URINE DRUG SCREEN, HOSP PERFORMED
Amphetamines: NOT DETECTED
Barbiturates: NOT DETECTED
Benzodiazepines: NOT DETECTED
Cocaine: NOT DETECTED
Opiates: NOT DETECTED
Tetrahydrocannabinol: NOT DETECTED

## 2022-12-11 LAB — HEPATIC FUNCTION PANEL
ALT: 18 U/L (ref 0–44)
AST: 22 U/L (ref 15–41)
Albumin: 4.2 g/dL (ref 3.5–5.0)
Alkaline Phosphatase: 64 U/L (ref 38–126)
Bilirubin, Direct: 0.2 mg/dL (ref 0.0–0.2)
Indirect Bilirubin: 0.7 mg/dL (ref 0.3–0.9)
Total Bilirubin: 0.9 mg/dL (ref 0.3–1.2)
Total Protein: 7.1 g/dL (ref 6.5–8.1)

## 2022-12-11 LAB — CBC WITH DIFFERENTIAL/PLATELET
Abs Immature Granulocytes: 0.02 10*3/uL (ref 0.00–0.07)
Basophils Absolute: 0.1 10*3/uL (ref 0.0–0.1)
Basophils Relative: 1 %
Eosinophils Absolute: 0.1 10*3/uL (ref 0.0–0.5)
Eosinophils Relative: 1 %
HCT: 51.4 % — ABNORMAL HIGH (ref 36.0–46.0)
Hemoglobin: 17.6 g/dL — ABNORMAL HIGH (ref 12.0–15.0)
Immature Granulocytes: 0 %
Lymphocytes Relative: 27 %
Lymphs Abs: 2.5 10*3/uL (ref 0.7–4.0)
MCH: 31.4 pg (ref 26.0–34.0)
MCHC: 34.2 g/dL (ref 30.0–36.0)
MCV: 91.8 fL (ref 80.0–100.0)
Monocytes Absolute: 0.6 10*3/uL (ref 0.1–1.0)
Monocytes Relative: 7 %
Neutro Abs: 5.9 10*3/uL (ref 1.7–7.7)
Neutrophils Relative %: 64 %
Platelets: 217 10*3/uL (ref 150–400)
RBC: 5.6 MIL/uL — ABNORMAL HIGH (ref 3.87–5.11)
RDW: 14.6 % (ref 11.5–15.5)
WBC: 9.1 10*3/uL (ref 4.0–10.5)
nRBC: 0 % (ref 0.0–0.2)

## 2022-12-11 LAB — URINALYSIS, ROUTINE W REFLEX MICROSCOPIC
Bilirubin Urine: NEGATIVE
Glucose, UA: NEGATIVE mg/dL
Hgb urine dipstick: NEGATIVE
Ketones, ur: NEGATIVE mg/dL
Leukocytes,Ua: NEGATIVE
Nitrite: NEGATIVE
Protein, ur: NEGATIVE mg/dL
Specific Gravity, Urine: 1.013 (ref 1.005–1.030)
pH: 7 (ref 5.0–8.0)

## 2022-12-11 LAB — TROPONIN I (HIGH SENSITIVITY)
Troponin I (High Sensitivity): 13 ng/L (ref <18)
Troponin I (High Sensitivity): 12 ng/L (ref <18)

## 2022-12-11 LAB — BASIC METABOLIC PANEL
Anion gap: 9 (ref 5–15)
BUN: 21 mg/dL (ref 8–23)
CO2: 28 mmol/L (ref 22–32)
Calcium: 10.2 mg/dL (ref 8.9–10.3)
Chloride: 103 mmol/L (ref 98–111)
Creatinine, Ser: 0.86 mg/dL (ref 0.44–1.00)
GFR, Estimated: >60 mL/min (ref >60)
Glucose, Bld: 109 mg/dL — ABNORMAL HIGH (ref 70–99)
Potassium: 5.2 mmol/L — ABNORMAL HIGH (ref 3.5–5.1)
Sodium: 140 mmol/L (ref 135–145)

## 2022-12-11 LAB — ETHANOL: Alcohol, Ethyl (B): <10 mg/dL (ref <10)

## 2022-12-11 LAB — AMMONIA: Ammonia: 12 umol/L (ref 9–35)

## 2022-12-11 MED ORDER — CYCLOSPORINE 0.05 % OP EMUL
1.0000 [drp] | Freq: Two times a day (BID) | OPHTHALMIC | Status: DC
  Administered 2022-12-12 – 2022-12-13 (×3): 1 [drp] via OPHTHALMIC
  Filled 2022-12-11 (×6): qty 30

## 2022-12-11 MED ORDER — METOPROLOL SUCCINATE ER 25 MG PO TB24
25.0000 mg | ORAL_TABLET | Freq: Every day | ORAL | Status: DC
  Administered 2022-12-12 – 2022-12-13 (×2): 25 mg via ORAL
  Filled 2022-12-11 (×2): qty 1

## 2022-12-11 MED ORDER — MIDAZOLAM HCL 2 MG/2ML IJ SOLN
2.0000 mg | Freq: Once | INTRAMUSCULAR | Status: AC
  Administered 2022-12-11: 2 mg via INTRAVENOUS
  Filled 2022-12-11: qty 2

## 2022-12-11 MED ORDER — APIXABAN 5 MG PO TABS
5.0000 mg | ORAL_TABLET | Freq: Two times a day (BID) | ORAL | Status: DC
  Administered 2022-12-12 – 2022-12-13 (×3): 5 mg via ORAL
  Filled 2022-12-11 (×4): qty 1

## 2022-12-11 MED ORDER — VITAMIN B-12 1000 MCG PO TABS
1000.0000 ug | ORAL_TABLET | Freq: Every day | ORAL | Status: DC
  Administered 2022-12-12 – 2022-12-13 (×2): 1000 ug via ORAL
  Filled 2022-12-11 (×2): qty 1

## 2022-12-11 MED ORDER — PRAVASTATIN SODIUM 40 MG PO TABS
40.0000 mg | ORAL_TABLET | Freq: Every day | ORAL | Status: DC
  Administered 2022-12-12: 40 mg via ORAL
  Filled 2022-12-11: qty 1
  Filled 2022-12-11: qty 2

## 2022-12-11 MED ORDER — ONDANSETRON HCL 4 MG/2ML IJ SOLN
4.0000 mg | Freq: Once | INTRAMUSCULAR | Status: AC
  Administered 2022-12-11: 4 mg via INTRAVENOUS
  Filled 2022-12-11: qty 2

## 2022-12-11 MED ORDER — ACETAMINOPHEN 325 MG PO TABS
650.0000 mg | ORAL_TABLET | Freq: Four times a day (QID) | ORAL | Status: DC | PRN
  Administered 2022-12-12: 650 mg via ORAL
  Filled 2022-12-11: qty 2

## 2022-12-11 MED ORDER — FENTANYL CITRATE PF 50 MCG/ML IJ SOSY
25.0000 ug | PREFILLED_SYRINGE | Freq: Once | INTRAMUSCULAR | Status: AC
  Administered 2022-12-11: 25 ug via INTRAVENOUS
  Filled 2022-12-11: qty 1

## 2022-12-11 MED ORDER — SODIUM CHLORIDE (HYPERTONIC) 5 % OP OINT
1.0000 | TOPICAL_OINTMENT | Freq: Every day | OPHTHALMIC | Status: DC
  Filled 2022-12-11 (×3): qty 3.5

## 2022-12-11 MED ORDER — ACETAMINOPHEN 650 MG RE SUPP: 650.0000 mg | Freq: Four times a day (QID) | RECTAL | Status: DC | PRN

## 2022-12-11 NOTE — ED Notes (Signed)
Care link notified for pt transport.  

## 2022-12-11 NOTE — ED Provider Notes (Signed)
[place] EMERGENCY DEPARTMENT AT [facility] Provider Note   CSN: [id number] Arrival date & time: 12/11/22  5621     History  Chief Complaint  Patient presents with   Dizziness   Altered Mental Status    Norabelle L Hedgeman is a 89 y.o. female presenting from [facility] nursing facility with abrupt confusion, dizziness, difficulty ambulating.  CBG wnl by EMS.   patient self appears confused my exam, reports that she is having a headache, feels that she needs to urinate but not able to urinate.  Patient has a known history of a large meningioma with obstructive pathology noted on prior CT imaging, evidence of mild hydrocephalus.  She was seen in the emergency department approximately 1 week ago for balance difficulty and intermittent right lower extremity weakness, had CT angiogram performed that showed no large vessel occlusion.  At that time, per my review of external records, the case was discussed with the on-call neurosurgeon Dr Lovell Sheehan, who advised that the tumor is significantly larger could explain some of her weakness, but did not feel that the obstructive hydrocephalus was severe enough to explain the weakness.  She normally walks with a walker but has not been able to walk today.  Patient was more recently seen by her neurologist 4 days ago, Dr Everlena Cooper on 12/07/22, who notes that the patient has a meningioma with mass effect on the brainstem that has been reportedly stable, no surgery is "not an option as per neurosurgery".  I cannot find neurosurgery office records in our medical system. Dr Everlena Cooper had ordered outpatient MRI.  She has a history of A-fib and she is on Eliquis.  [facility] nurse explained to me the patient appeared much more confused than normal today.  Typically the patient "lucid and alert".  Today the patient was noted to be "somewhat more confused about typical things around the building, indecisive about ordering lunch, confused about the elevator  buttons," which is unusual for her.   HPI     Home Medications Prior to Admission medications   Medication Sig Start Date End Date Taking? Authorizing Provider  Biotin 5 MG CAPS Take 5 mg by mouth daily.    Yes [provider]  Calcium Carb-Cholecalciferol (CALCIUM 600+D3) 600-10 MG-MCG TABS Take 1 tablet by mouth in the morning.   Yes [provider]  Cholecalciferol (VITAMIN D3) 1000 UNITS CAPS Take 1,000 Units by mouth daily.   Yes [provider]  cycloSPORINE (RESTASIS) 0.05 % ophthalmic emulsion Place 1 drop into both eyes See admin instructions. Instill 1 drop into each eye two times a day, after applying a hot compress for 3-5 minutes beforehand 10/11/16  Yes [provider]  ELIQUIS 5 MG TABS tablet TAKE ONE TABLET BY MOUTH TWICE DAILY Patient taking differently: Take 5 mg by mouth 2 (two) times daily. 11/19/21  Yes Causey, Larna Daughters, NP  loperamide (IMODIUM A-D) 2 MG tablet Take 2 mg by mouth 3 (three) times daily as needed for diarrhea or loose stools.   Yes [provider]  metoprolol succinate (TOPROL XL) 25 MG 24 hr tablet Take 25 mg every morning Patient taking differently: Take 25 mg by mouth in the morning. 06/08/22  Yes Croitoru, Mihai, MD  Multiple Vitamins-Minerals (MULTIVITAMIN WITH MINERALS) tablet Take 2 tablets by mouth daily with breakfast.   Yes [provider]  NONFORMULARY OR COMPOUNDED ITEM Apply 1 application  topically See admin instructions. Nystatin + Zinc Cream 1:1 ratio- Apply to perirectal  area once a day as needed for rashes   Yes [provider]  Omega-3 Fatty Acids (FISH OIL PO) Take 1 capsule by mouth daily after breakfast.   Yes [provider]  polyethylene glycol (MIRALAX / GLYCOLAX) 17 g packet Take 17 g by mouth in the morning.   Yes [provider]  pravastatin (PRAVACHOL) 40 MG tablet TAKE ONE TABLET BY MOUTH ONCE DAILY Patient taking differently: Take 40 mg by  mouth every evening. 08/05/22  Yes Mahlon Gammon, MD  sennosides-docusate sodium (SENOKOT-S) 8.6-50 MG tablet Take 2 tablets by mouth at bedtime as needed for constipation. Patient taking differently: Take 2 tablets by mouth at bedtime as needed for constipation (hold for diarrhea). 10/03/22  Yes Wert, Trula Ore, NP  sodium chloride (MURO 128) 5 % ophthalmic ointment Place 1 Application into both eyes at bedtime.   Yes [provider]  Turmeric 500 MG CAPS Take 500 mg by mouth in the morning.   Yes [provider]  vitamin B-12 (CYANOCOBALAMIN) 1000 MCG tablet Take 1 tablet (1,000 mcg total) by mouth daily. 07/19/18  Yes Serena Croissant, MD  acetaminophen (TYLENOL) 325 MG tablet Take 325 mg by mouth every 6 (six) hours as needed for moderate pain.    [provider]  nystatin cream (MYCOSTATIN) Apply 1 Application topically as needed for dry skin.    [provider]      Allergies    Sulfa antibiotics, Tape, Wound dressing adhesive, Glucosamine forte [nutritional supplements], and Latex    Review of Systems   Review of Systems  Physical Exam Updated Vital Signs BP 122/88   Pulse 84   Temp 98.3 F (36.8 C) (Oral)   Resp 17   SpO2 98%  Physical Exam Constitutional:      General: She is not in acute distress.    Comments: Repeating herself  HENT:     Head: Normocephalic and atraumatic.  Eyes:     Conjunctiva/sclera: Conjunctivae normal.     Pupils: Pupils are equal, round, and reactive to light.  Cardiovascular:     Rate and Rhythm: Normal rate and regular rhythm.  Pulmonary:     Effort: Pulmonary effort is normal. No respiratory distress.  Abdominal:     Tenderness: There is no abdominal tenderness.  Skin:    General: Skin is warm and dry.  Neurological:     General: No focal deficit present.     Mental Status: She is alert. Mental status is at baseline.     Comments: Verbal 4, Motor 6, Eyes 4 = GCS 14     ED Results / Procedures /  Treatments   Labs (all labs ordered are listed, but only abnormal results are displayed) Labs Reviewed  BASIC METABOLIC PANEL - Abnormal; Notable for the following components:      Result Value   Potassium 5.2 (*)    Glucose, Bld 109 (*)    All other components within normal limits  CBC WITH DIFFERENTIAL/PLATELET - Abnormal; Notable for the following components:   RBC 5.60 (*)    Hemoglobin 17.6 (*)    HCT 51.4 (*)    All other components within normal limits  URINALYSIS, ROUTINE W REFLEX MICROSCOPIC  ETHANOL  HEPATIC FUNCTION PANEL  RAPID URINE DRUG SCREEN, HOSP PERFORMED  AMMONIA  TROPONIN I (HIGH SENSITIVITY)  TROPONIN I (HIGH SENSITIVITY)    EKG EKG Interpretation  Date/Time:  Sunday Maddalynn 16 2024 19:11:29 EDT Ventricular Rate:  90 PR Interval:  QRS Duration: 81 QT Interval:  492 QTC Calculation: 579 R Axis:   13 Text Interpretation: Atrial fibrillation Anteroseptal infarct, age indeterminate Borderline repolarization abnormality Prolonged QT interval Confirmed by Alvester Chou [id number]) on 12/11/2022 7:24:12 PM  Radiology CT Head Wo Contrast  Result Date: 12/11/2022 CLINICAL DATA:  Mental status changes, unknown cause. Confusion and headache. Known meningioma. EXAM: CT HEAD WITHOUT CONTRAST TECHNIQUE: Contiguous axial images were obtained from the base of the skull through the vertex without intravenous contrast. RADIATION DOSE REDUCTION: This exam was performed according to the departmental dose-optimization program which includes automated exposure control, adjustment of the mA and/or kV according to patient size and/or use of iterative reconstruction technique. COMPARISON:  CT scan head and CTA head and neck 12/04/2022 and 12/05/2022, older studies back to November 2022. FINDINGS: Brain: Again noted is a 3.1 x 3.7 x 2.9 cm gray matter isodense extra-axial posterior fossa mass centered in between the dorsal clivus and the right anterolateral aspect of the brainstem and  extending into the foramina magnum. Today's measurements were obtained on axial image 7 and sagittal reconstruction image 43. The mass size is stable back to November 2022, with effacement of the fourth ventricle and right perimedullary cistern, and compression of the brainstem from the right and anteriorly. There are occasional punctate calcifications in the mass, which is probably a meningioma. There is mild-to-moderate atrophy and small-vessel disease. Moderate ventriculomegaly could be due to atrophic ventriculomegaly, chronic partial obstructive hydrocephalus or combination. The ventricles are stable in size and with no midline shift. No acute cortical based infarct, parenchymal mass, parenchymal or extra-axial hemorrhage are seen. Vascular: There are patchy calcifications in the carotid siphons and scattered calcific plaque in the distal vertebral arteries. No hyperdense central vessels are seen. Skull: Negative for fractures or focal lesions. Sinuses/Orbits: Negative orbits apart from old lens replacements. Clear sinuses and mastoids. Other: None. IMPRESSION: 1. No acute intracranial CT findings or interval changes. 2. Stable 3.1 x 3.7 x 2.9 cm posterior fossa mass, most likely a meningioma, with associated stable mass effect. 3. Atrophy and small-vessel disease. 4. Moderate ventriculomegaly which could be due to atrophic ventriculomegaly, chronic partial obstructive hydrocephalus or combination. Stable ventricular size. Electronically Signed   By: Almira Bar M.D.   On: 12/11/2022 20:17    Procedures Procedures    Medications Ordered in ED Medications  fentaNYL (SUBLIMAZE) injection 25 mcg (25 mcg Intravenous Given 12/11/22 2040)  ondansetron (ZOFRAN) injection 4 mg (4 mg Intravenous Given 12/11/22 2106)  midazolam (VERSED) injection 2 mg (2 mg Intravenous Given 12/11/22 2108)    ED Course/ Medical Decision Making/ A&P Clinical Course as of 12/11/22 2120  Sun Dec 11, 2022  1919 Left voicemail  with [facility] nursing supervisor per number I was directed to [MT]  2053 I spoke to Leo Grosser from NSGY who advises transfer to [facility] for MR imaging of the brain w/ and w/o contrast, NSGY to consult tomorrow - pt extremely unlikely to be NSGY candidate given location of meningioma and age/risk factors involved. [MT]  2101 I updated her daughter by phone regarding admission plans. [MT]    Clinical Course User Index [MT] Atianna Haidar, Kermit Balo, MD                             Medical Decision Making Amount and/or Complexity of Data Reviewed Labs: ordered. Radiology: ordered.  Risk Prescription drug management. Decision regarding hospitalization.   This patient presents to  the Emergency Department with complaint of altered mental status.  This involves an extensive number of treatment options, and is a complaint that carries with it a high risk of complications and morbidity.  The differential diagnosis includes hypoglycemia vs metabolic encephalopathy vs infection (including cystitis) vs ICH vs stroke vs polypharmacy vs other  The patient does have some confusion when I examine her, and is oriented only to herself, not to the date or year.  She seems to be mixing up her days and feels that she was in the ER yesterday, when it was well over a week ago.  However she is otherwise able to have a normal discussion with me, and is difficult for me to ascertain exactly what her baseline is.  Based on the supplemental history provided, it does appear likely or possible that the patient is confused off of her baseline.  I ordered, reviewed, and interpreted labs, including mild hypokalemia, no other emergent findings noted.  UA without evidence of infection I ordered medication pain, agitation, nausea I ordered imaging studies which included CT scan of the head I independently visualized and interpreted imaging which showed stable findings, no acute emergent issues and the monitor tracing which showed  NSR Additional history was obtained from EMS, staff at [facility] Previous records obtained and reviewed showing neurology outpatient records earlier this week, concern for potential TIA, MRI ordered by neurologist I personally reviewed the patients ECG which showed sinus rhythm with no acute ischemic findings  I consulted NSGY and discussed lab and imaging findings - see ED course  After the interventions stated above, I reevaluated the patient and found patient remained stable.  She was complaining of nausea and Zofran was ordered.  A small dose of Versed was ordered as the patient did appear extremely anxious in the bed.  While we would strive to avoid prolonged or repeated benzo use in the hospital, a single dose is reasonable at this time in the ED.  Plan to admit for MRI scan, which is ordered, to better differentiate any changes in the patient's meningioma, as well as to evaluate for potential posterior circulation stroke or injury.         Final Clinical Impression(s) / ED Diagnoses Final diagnoses:  Altered mental status, unspecified altered mental status type    Rx / DC Orders ED Discharge Orders     None         Terald Sleeper, MD 12/11/22 2121

## 2022-12-11 NOTE — ED Triage Notes (Signed)
BIBA from [facility] for confusion,increased weakness, dizziness, and unable to ambulate. 180/90 BP 50-70 HR Afib 112 cbg 98% room air

## 2022-12-11 NOTE — H&P (Signed)
History and Physical    Jessica Mullins ZOX:[id number] DOB: [birth year] DOA: 12/11/2022  PCP: Mahlon Gammon, MD   Chief Complaint: ams  HPI: Jessica Mullins is a 89 y.o. female with medical history significant of A-fib, breast cancer, GERD, hypertension, hyperlipidemia, meningioma who presented to the emergency department due to altered mental status.  Patient lives in a nursing facility but was noted to be dizzy and having difficulty ambulating.  She was sent to the ER for further assessment.  On arrival she reportedly been complaining of a headache.  She has known history of meningioma on prior imaging with hydrocephalus.  She was seen in the ER on 6/10 patient was sent home due to being clinically stable.  Neurosurgery was reportedly contacted at that time and thought  Obstructive hydrocephalus was not severe enough to explain her weakness.  On arrival to the emergency department she was afebrile hemodynamically stable.  Labs were obtained which revealed potassium 5.2, creatinine 0.86, WBC 9.1, hemoglobin 17.6, troponin within normal limits, LFTs within normal limits.  Urinalysis showed no concern for infection.  Patient underwent CT head which demonstrated 3.1 x 3.7 cm brain mass.  Neurosurgery was consulted and rec transfer to [facility] for neurosurgical consultation.  MRI brain was ordered.  On assessment patient had reportedly been altered and was given Versed.  Patient was difficult to arouse and unable to contribute to her evaluation.   Review of Systems: Review of Systems  All other systems reviewed and are negative.    As per HPI otherwise 10 point review of systems negative.   Allergies  Allergen Reactions   Sulfa Antibiotics Other (See Comments)    As a child, became "rigid as a stick and not responsive"   Tape Other (See Comments)    Blisters, Please use "paper" tape only for short periods of time   Wound Dressing Adhesive Other (See Comments)    Blisters, Please use  "paper" tape only for short periods of time, Blisters, Please use "paper" tape only for short periods of time   Glucosamine Forte [Nutritional Supplements] Rash   Latex Rash    Past Medical History:  Diagnosis Date   Allergy    Ankle fracture, left    Atrial fibrillation (HCC)    Per [facility] New Patient Packet    Breast cancer (HCC) 1115/16   left    Bursitis of right shoulder    Cataract    Family history of cancer    Fracture of right wrist    GERD (gastroesophageal reflux disease)    Hyperlipidemia    Hypertension    Lumbar radiculitis    Per [facility] New Patient Packet    Meningioma [facility])    Per [facility] New Patient Packet    Skin cancer 2000   melanoma and basal cell   Stress fracture    Right Heel, Per [facility] New Patient Packet    TIA (transient ischemic attack) 2022   Vaso vagal episode    during preparation for colonoscopy    Past Surgical History:  Procedure Laterality Date   ABDOMINAL HYSTERECTOMY  1988   TAH/BSO--FIBROIDS   APPENDECTOMY     BREAST LUMPECTOMY     B/L--[facility]   BREAST LUMPECTOMY WITH RADIOACTIVE SEED AND SENTINEL LYMPH NODE BIOPSY Left 06/11/2015   Procedure: LEFT BREAST LUMPECTOMY WITH RADIOACTIVE SEED AND LEFT SENTINEL LYMPH NODE MAPPING;  Surgeon: Harriette Bouillon, MD;  Location: [facility] OR;  Service: General;  Laterality: Left;   CARDIOVERSION N/A 01/11/2022  Procedure: CARDIOVERSION;  Surgeon: Jodelle Red, MD;  Location: [facility] ENDOSCOPY;  Service: Cardiovascular;  Laterality: N/A;   CATARACT EXTRACTION  2015   COLONOSCOPY  2010   DG  BONE DENSITY ([facility] HX)     EYE SURGERY Bilateral    cataracts   fiberadenoma Bilateral 1978, 1980   GANGLION CYST EXCISION     L  hand   Lipiflow procedure       reports that she has never smoked. She has never used smokeless tobacco. She reports current alcohol use. She reports that she does not use drugs.  Family History  Problem Relation Age of Onset   Cancer Mother        mets to bone--? primary   Coronary  artery disease Brother        died of M! @ 33   Diabetes Brother    Hyperlipidemia Brother    Hypertension Brother    Heart disease Brother        chf   Heart disease Father 39       MI   Lung cancer Sister 11       former smoker   Diabetes Maternal Aunt    Lung cancer Other    Thrombosis Other        thromboembolism clotting disorder--? father died of ? clot    Hypertension Son    High Cholesterol Son     Prior to Admission medications   Medication Sig Start Date End Date Taking? Authorizing Provider  acetaminophen (TYLENOL) 325 MG tablet Take 325 mg by mouth every 6 (six) hours as needed for moderate pain.   Yes [provider]  Biotin 5 MG CAPS Take 5 mg by mouth daily.    Yes [provider]  Calcium Carb-Cholecalciferol (CALCIUM 600+D3) 600-10 MG-MCG TABS Take 1 tablet by mouth in the morning.   Yes [provider]  Cholecalciferol (VITAMIN D3) 1000 UNITS CAPS Take 1,000 Units by mouth daily.   Yes [provider]  cycloSPORINE (RESTASIS) 0.05 % ophthalmic emulsion Place 1 drop into both eyes See admin instructions. Instill 1 drop into each eye two times a day, after applying a hot compress for 3-5 minutes beforehand 10/11/16  Yes [provider]  ELIQUIS 5 MG TABS tablet TAKE ONE TABLET BY MOUTH TWICE DAILY Patient taking differently: Take 5 mg by mouth 2 (two) times daily. 11/19/21  Yes Causey, Larna Daughters, NP  loperamide (IMODIUM A-D) 2 MG tablet Take 2 mg by mouth 3 (three) times daily as needed for diarrhea or loose stools.   Yes [provider]  metoprolol succinate (TOPROL XL) 25 MG 24 hr tablet Take 25 mg every morning Patient taking differently: Take 25 mg by mouth in the morning. 06/08/22  Yes Croitoru, Mihai, MD  Multiple Vitamins-Minerals (MULTIVITAMIN WITH MINERALS) tablet Take 2 tablets by mouth daily with breakfast.   Yes [provider]  NONFORMULARY OR COMPOUNDED ITEM Apply 1 application  topically  See admin instructions. Nystatin + Zinc Cream 1:1 ratio- Apply to perirectal area once a day as needed for rashes   Yes [provider]  Omega-3 Fatty Acids (FISH OIL PO) Take 1 capsule by mouth daily after breakfast.   Yes [provider]  polyethylene glycol (MIRALAX / GLYCOLAX) 17 g packet Take 17 g by mouth in the morning. Mix 17 grams of powder into 4-6 ounces of water and drink every morning- may take an additional dose (17 grams) once a day as needed  for constipation   Yes [provider]  pravastatin (PRAVACHOL) 40 MG tablet TAKE ONE TABLET BY MOUTH ONCE DAILY Patient taking differently: Take 40 mg by mouth every evening. 08/05/22  Yes Mahlon Gammon, MD  sennosides-docusate sodium (SENOKOT-S) 8.6-50 MG tablet Take 2 tablets by mouth at bedtime as needed for constipation. Patient taking differently: Take 2 tablets by mouth at bedtime as needed for constipation (hold for diarrhea). 10/03/22  Yes Wert, Trula Ore, NP  sodium chloride (MURO 128) 5 % ophthalmic ointment Place 1 Application into both eyes at bedtime.   Yes [provider]  Turmeric 500 MG CAPS Take 500 mg by mouth in the morning.   Yes [provider]  vitamin B-12 (CYANOCOBALAMIN) 1000 MCG tablet Take 1 tablet (1,000 mcg total) by mouth daily. 07/19/18  Yes Serena Croissant, MD    Physical Exam: Vitals:   12/11/22 1910 12/11/22 1918 12/11/22 1950 12/11/22 2200  BP: (!) 172/143 (!) 176/118 122/88 (!) 146/117  Pulse: 89  84   Resp: 15  17 18   Temp:   98.3 F (36.8 C)   TempSrc:   Oral   SpO2: 99%  98%    Physical Exam Vitals reviewed.  Constitutional:      Appearance: She is normal weight.  HENT:     Head: Normocephalic.     Nose: Nose normal.     Mouth/Throat:     Mouth: Mucous membranes are moist.     Pharynx: Oropharynx is clear.  Eyes:     Conjunctiva/sclera: Conjunctivae normal.     Pupils: Pupils are equal, round, and reactive to light.  Cardiovascular:     Rate and  Rhythm: Normal rate.     Pulses: Normal pulses.  Pulmonary:     Effort: Pulmonary effort is normal.     Breath sounds: Normal breath sounds.  Abdominal:     General: Bowel sounds are normal.  Musculoskeletal:        General: Normal range of motion.     Cervical back: Normal range of motion.  Neurological:     General: No focal deficit present.     Mental Status: She is disoriented.     Motor: No weakness.      Labs on Admission: I have personally reviewed the patients's labs and imaging studies.  Assessment/Plan Principal Problem:   Encephalopathy   # Acute encephalopathy in setting of known brain mass, POA, active - Brain mass is thought to be benign meningioma - Last MRI brain 1 year ago showed probable meningioma. - Nonfocal on exam - No infectious complaints  PLAN: FU blood Cx FU CXR FU MR brain FU NSG consultation at [facility] and if symptoms are thought to be due to meningoma no need for further workup of encephalopathy  #paroxysmal afib- continue eliquis  #HTN- continue metoprolol  #HLD- continue statin  #GOC- patient DNR from prior presentation.   Admission status: Inpatient Med-Surg  Certification: The appropriate patient status for this patient is INPATIENT. Inpatient status is judged to be reasonable and necessary in order to provide the required intensity of service to ensure the patient's safety. The patient's presenting symptoms, physical exam findings, and initial radiographic and laboratory data in the context of their chronic comorbidities is felt to place them at high risk for further clinical deterioration. Furthermore, it is not anticipated that the patient will be medically stable for discharge from the hospital within 2 midnights of admission.   * I certify that at the point of  admission it is my clinical judgment that the patient will require inpatient hospital care spanning beyond 2 midnights from the point of admission due to high intensity of  service, high risk for further deterioration and high frequency of surveillance required.Alan Mulder MD [facility] If 7PM-7AM, please contact night-coverage [email or web address]  12/11/2022, 10:44 PM

## 2022-12-12 ENCOUNTER — Inpatient Hospital Stay (HOSPITAL_COMMUNITY): Payer: Medicare PPO

## 2022-12-12 ENCOUNTER — Other Ambulatory Visit: Payer: Self-pay

## 2022-12-12 ENCOUNTER — Other Ambulatory Visit: Payer: Self-pay | Admitting: Family Medicine

## 2022-12-12 DIAGNOSIS — R4182 Altered mental status, unspecified: Secondary | ICD-10-CM

## 2022-12-12 LAB — BASIC METABOLIC PANEL
Anion gap: 13 (ref 5–15)
BUN: 13 mg/dL (ref 8–23)
CO2: 25 mmol/L (ref 22–32)
Calcium: 9.2 mg/dL (ref 8.9–10.3)
Chloride: 101 mmol/L (ref 98–111)
Creatinine, Ser: 0.77 mg/dL (ref 0.44–1.00)
GFR, Estimated: >60 mL/min (ref >60)
Glucose, Bld: 88 mg/dL (ref 70–99)
Potassium: 3.8 mmol/L (ref 3.5–5.1)
Sodium: 139 mmol/L (ref 135–145)

## 2022-12-12 LAB — CBC
HCT: 50.6 % — ABNORMAL HIGH (ref 36.0–46.0)
Hemoglobin: 17.6 g/dL — ABNORMAL HIGH (ref 12.0–15.0)
MCH: 31.4 pg (ref 26.0–34.0)
MCHC: 34.8 g/dL (ref 30.0–36.0)
MCV: 90.4 fL (ref 80.0–100.0)
Platelets: 181 10*3/uL (ref 150–400)
RBC: 5.6 MIL/uL — ABNORMAL HIGH (ref 3.87–5.11)
RDW: 14.4 % (ref 11.5–15.5)
WBC: 10.3 10*3/uL (ref 4.0–10.5)
nRBC: 0 % (ref 0.0–0.2)

## 2022-12-12 LAB — RPR: RPR Ser Ql: NONREACTIVE

## 2022-12-12 LAB — HIV ANTIBODY (ROUTINE TESTING W REFLEX): HIV Screen 4th Generation wRfx: NONREACTIVE

## 2022-12-12 LAB — VITAMIN B12: Vitamin B-12: 766 pg/mL (ref 180–914)

## 2022-12-12 LAB — FOLATE: Folate: 17.9 ng/mL (ref >5.9)

## 2022-12-12 LAB — TSH: TSH: 3.099 u[IU]/mL (ref 0.350–4.500)

## 2022-12-12 MED ORDER — GADOBUTROL 1 MMOL/ML IV SOLN
6.0000 mL | Freq: Once | INTRAVENOUS | Status: AC | PRN
  Administered 2022-12-12: 6 mL via INTRAVENOUS

## 2022-12-12 MED ORDER — CHLORHEXIDINE GLUCONATE CLOTH 2 % EX PADS
6.0000 | MEDICATED_PAD | Freq: Every day | CUTANEOUS | Status: DC
  Administered 2022-12-12: 6 via TOPICAL

## 2022-12-12 MED ORDER — SODIUM CHLORIDE (HYPERTONIC) 5 % OP SOLN
1.0000 [drp] | Freq: Every day | OPHTHALMIC | Status: DC
  Administered 2022-12-12: 1 [drp] via OPHTHALMIC
  Filled 2022-12-12: qty 15

## 2022-12-12 NOTE — Progress Notes (Signed)
PROGRESS NOTE   Jessica Mullins  ZOX:[id number] DOB: 1931-03-07 DOA: 12/11/2022 PCP: Mahlon Gammon, MD   Date of Service: the patient was seen and examined on 12/12/2022  Brief Narrative:  Jessica Mullins is a 89 y.o. female with medical history significant of A-fib, breast cancer, GERD, hypertension, hyperlipidemia, meningioma who presented to the emergency department due to altered mental status.  Patient lives in a nursing facility but was noted to be dizzy and having difficulty ambulating.  She was sent to the ER for further assessment.  On arrival she reportedly been complaining of a headache.  She has known history of meningioma on prior imaging with hydrocephalus.  She was seen in the ER on 6/10 patient was sent home due to being clinically stable.  Neurosurgery was reportedly contacted at that time and thought obstructive hydrocephalus was not severe enough to explain her weakness. On arrival to the ED she was afebrile hemodynamically stable.  Labs: potassium 5.2, creatinine 0.86, WBC 9.1, hemoglobin 17.6, troponin within normal limits, LFTs within normal limits.  UA showed no concern for infection. CT head which demonstrated 3.1 x 3.7 cm brain mass.  Neurosurgery was consulted and rec transfer to [facility] for neurosurgical consultation.  MRI brain was ordered.   Assessment and Plan:  Acute encephalopathy in setting of known brain mass Pt appears to be back to her baseline. UDS negative, troponin, ammonia, ethanol levels within normal limits.  UA negative.  CBC without signs of infection. K 5.2. CT head: Stable 3.1 x 3.7 x 2.9 cm posterior fossa mass, most likely a meningioma, with associated stable mass effect, moderate ventriculomegaly. CXR: No active disease, mild cardiomegaly.   -Neurosurgery consulted-recommended given that she is 89 years old there is no neurosurgical intervention and to continue medical management -Continue metabolic and infectious workup: Follow-up blood cx, CBC,  CMP, TSH, B12, folate, HIV -Consider neurology or cardiac consult if no improvement in patient's symptoms -PT OT -Once patient is at baseline and above workup negative can return back to nursing home   Paroxysmal Afib -Continue Eliquis   HTN BP 144/95 -Continue metoprolol   HLD -Continue statin   GOC Patient DNR from prior presentation.   Subjective:  No acute concerns from patient.   Physical Exam:  Vitals:   12/12/22 0631 12/12/22 0700 12/12/22 0715 12/12/22 0730  BP: (!) 146/82 (!) 136/102 (!) 135/103 (!) 144/95  Pulse: 89 61 66 87  Resp: 18   16  Temp: 98.6 F (37 C)     TempSrc: Oral     SpO2: 96% 100% 100% 100%    General: Alert, no acute distress, frail elderly female, sitting up in bed  Cardio: Normal S1 and S2, RRR, no r/m/g Pulm: CTAB, normal work of breathing Abdomen: Bowel sounds normal. Abdomen soft and non-tender.  Extremities: No peripheral edema.  Neuro: Cranial nerves grossly intact, ANO x 4   Data Reviewed:  I have personally reviewed and interpreted labs, imaging.  CBC: Recent Labs  Lab 12/11/22 1937  WBC 9.1  NEUTROABS 5.9  HGB 17.6*  HCT 51.4*  MCV 91.8  PLT 217   Basic Metabolic Panel: Recent Labs  Lab 12/11/22 1937  NA 140  K 5.2*  CL 103  CO2 28  GLUCOSE 109*  BUN 21  CREATININE 0.86  CALCIUM 10.2   GFR: Estimated Creatinine Clearance: 37 mL/min (by C-G formula based on SCr of 0.86 mg/dL). Liver Function Tests: Recent Labs  Lab 12/11/22 1937  AST 22  ALT 18  ALKPHOS 64  BILITOT 0.9  PROT 7.1  ALBUMIN 4.2    Coagulation Profile: No results for input(s): "INR", "PROTIME" in the last 168 hours.  Code Status:  DNR.  Code status decision has been confirmed with: patient  Family Communication:     Severity of Illness:  The appropriate patient status for this patient is INPATIENT. Inpatient status is judged to be reasonable and necessary in order to provide the required intensity of service to ensure the  patient's safety. The patient's presenting symptoms, physical exam findings, and initial radiographic and laboratory data in the context of their chronic comorbidities is felt to place them at high risk for further clinical deterioration. Furthermore, it is not anticipated that the patient will be medically stable for discharge from the hospital within 2 midnights of admission.   * I certify that at the point of admission it is my clinical judgment that the patient will require inpatient hospital care spanning beyond 2 midnights from the point of admission due to high intensity of service, high risk for further deterioration and high frequency of surveillance required.*   Author:  Rolm Gala MD  12/12/2022 8:05 AM

## 2022-12-12 NOTE — Progress Notes (Signed)
Called in regards to this patients head CT which showed a stable posterior fossa mass with increase in her ventricular size compared to previous CT.There was a change in her mental status and she was brought to the ED. Dr. Lovell Sheehan was called about this patient last week as well and it was recommended she be sent back to nursing facility. Again, the patient is 89 years old and there is no role for neurosurgical intervention at this point. I recommended medical management.

## 2022-12-12 NOTE — Progress Notes (Signed)
Mobility Specialist Progress Note   12/12/22 1630  Mobility  Activity Ambulated with assistance in hallway  Level of Assistance Contact guard assist, steadying assist  Assistive Device Front wheel walker  Distance Ambulated (ft) 230 ft  Activity Response Tolerated well  Mobility Referral Yes  $Mobility charge 1 Mobility  Mobility Specialist Start Time (ACUTE ONLY) 1547  Mobility Specialist Stop Time (ACUTE ONLY) 1625  Mobility Specialist Time Calculation (min) (ACUTE ONLY) 38 min   Received pt in chair having no complaints and agreeable to mobility. Pt was asymptomatic throughout ambulation and returned to room w/o fault. Left in bed w/ call bell in reach and all needs met.  Frederico Hamman Mobility Specialist Please contact via SecureChat or  Rehab office at [phone]

## 2022-12-13 ENCOUNTER — Encounter: Payer: Medicare PPO | Admitting: Internal Medicine

## 2022-12-13 ENCOUNTER — Encounter: Payer: Self-pay | Admitting: Internal Medicine

## 2022-12-13 DIAGNOSIS — R4182 Altered mental status, unspecified: Secondary | ICD-10-CM | POA: Diagnosis not present

## 2022-12-13 DIAGNOSIS — G9340 Encephalopathy, unspecified: Secondary | ICD-10-CM | POA: Diagnosis not present

## 2022-12-13 LAB — BASIC METABOLIC PANEL
Anion gap: 12 (ref 5–15)
BUN: 17 mg/dL (ref 8–23)
CO2: 24 mmol/L (ref 22–32)
Calcium: 8.8 mg/dL — ABNORMAL LOW (ref 8.9–10.3)
Chloride: 102 mmol/L (ref 98–111)
Creatinine, Ser: 0.92 mg/dL (ref 0.44–1.00)
GFR, Estimated: 59 mL/min — ABNORMAL LOW (ref >60)
Glucose, Bld: 98 mg/dL (ref 70–99)
Potassium: 3.4 mmol/L — ABNORMAL LOW (ref 3.5–5.1)
Sodium: 138 mmol/L (ref 135–145)

## 2022-12-13 LAB — CBC
HCT: 49 % — ABNORMAL HIGH (ref 36.0–46.0)
Hemoglobin: 16.5 g/dL — ABNORMAL HIGH (ref 12.0–15.0)
MCH: 30.3 pg (ref 26.0–34.0)
MCHC: 33.7 g/dL (ref 30.0–36.0)
MCV: 90.1 fL (ref 80.0–100.0)
Platelets: 208 10*3/uL (ref 150–400)
RBC: 5.44 MIL/uL — ABNORMAL HIGH (ref 3.87–5.11)
RDW: 14.6 % (ref 11.5–15.5)
WBC: 8.4 10*3/uL (ref 4.0–10.5)
nRBC: 0 % (ref 0.0–0.2)

## 2022-12-13 MED ORDER — POTASSIUM CHLORIDE CRYS ER 20 MEQ PO TBCR
40.0000 meq | EXTENDED_RELEASE_TABLET | Freq: Two times a day (BID) | ORAL | Status: DC
  Administered 2022-12-13: 40 meq via ORAL
  Filled 2022-12-13: qty 2

## 2022-12-13 NOTE — Discharge Summary (Signed)
Physician Discharge Summary  Patient: Jessica Mullins ZOX:[id number] DOB: [birth year]   Code Status: DNR Admit date: 12/11/2022 Discharge date: 12/13/2022 Disposition: Assisted living, PT and OT PCP: Mahlon Gammon, MD  Recommendations for Outpatient Follow-up:  Follow up with PCP within 1-2 weeks Regarding general hospital follow up and preventative care  Discharge Diagnoses:  Principal Problem:   Encephalopathy Active Problems:   Altered mental status  Brief Hospital Course Summary: Jessica Mullins is a 89 y.o. female with medical history significant of A-fib, breast cancer, GERD, hypertension, hyperlipidemia, meningioma who presented to the emergency department due to altered mental status.  Patient lives in a nursing facility but was noted to be dizzy and having difficulty ambulating.  She was sent to the ER for further assessment.  On arrival she reportedly been complaining of a headache.  She has known history of meningioma on prior imaging with hydrocephalus.  She was seen in the ER on 6/10 patient was sent home due to being clinically stable.  Neurosurgery was reportedly contacted at that time and thought obstructive hydrocephalus was not severe enough to explain her weakness. On arrival to the ED she was afebrile hemodynamically stable.  Labs: potassium 5.2, creatinine 0.86, WBC 9.1, hemoglobin 17.6, troponin within normal limits, LFTs within normal limits.  UA showed no concern for infection. CT head which demonstrated 3.1 x 3.7 cm brain mass- stable from previous.  Neurosurgery was consulted and did not recommend surgical intervention.  Patient is at her mental baseline today is although has some unsteady gait requiring use of a walker, she states she is at her baseline physically as well. She states that she has had her tumor for 15 years.  She has remained hemodynamically stable on home medications since admission without any new interventions. PT/OT evaluated and recommended  home health.   Discharge Condition: Good, improved Recommended discharge diet: Regular healthy diet  Consultations: Neurosurgery   Procedures/Studies: None   Discharge Instructions     Discharge patient   Complete by: As directed    Discharge disposition: 06-Home-Health Care Svc   Discharge patient date: 12/13/2022      Allergies as of 12/13/2022       Reactions   Sulfa Antibiotics Other (See Comments)   As a child, became "rigid as a stick and not responsive"   Tape Other (See Comments)   Blisters, Please use "paper" tape only for short periods of time   Wound Dressing Adhesive Other (See Comments)   Blisters, Please use "paper" tape only for short periods of time, Blisters, Please use "paper" tape only for short periods of time   Glucosamine Forte [nutritional Supplements] Rash   Latex Rash        Medication List     STOP taking these medications    sennosides-docusate sodium 8.6-50 MG tablet Commonly known as: SENOKOT-S       TAKE these medications    acetaminophen 325 MG tablet Commonly known as: TYLENOL Take 325 mg by mouth every 6 (six) hours as needed for moderate pain.   Biotin 5 MG Caps Take 5 mg by mouth daily.   Calcium 600+D3 600-10 MG-MCG Tabs Generic drug: Calcium Carb-Cholecalciferol Take 1 tablet by mouth in the morning.   cyanocobalamin 1000 MCG tablet Commonly known as: VITAMIN B12 Take 1 tablet (1,000 mcg total) by mouth daily.   cycloSPORINE 0.05 % ophthalmic emulsion Commonly known as: RESTASIS Place 1 drop into both eyes See admin instructions. Instill 1 drop into  each eye two times a day, after applying a hot compress for 3-5 minutes beforehand   Eliquis 5 MG Tabs tablet Generic drug: apixaban TAKE ONE TABLET BY MOUTH TWICE DAILY What changed: how much to take   FISH OIL PO Take 1 capsule by mouth daily after breakfast.   loperamide 2 MG tablet Commonly known as: IMODIUM A-D Take 2 mg by mouth 3 (three) times daily as  needed for diarrhea or loose stools.   metoprolol succinate 25 MG 24 hr tablet Commonly known as: Toprol XL Take 25 mg every morning What changed:  how much to take how to take this when to take this additional instructions   multivitamin with minerals tablet Take 2 tablets by mouth daily with breakfast.   NONFORMULARY OR COMPOUNDED ITEM Apply 1 application  topically See admin instructions. Nystatin + Zinc Cream 1:1 ratio- Apply to perirectal area once a day as needed for rashes   polyethylene glycol 17 g packet Commonly known as: MIRALAX / GLYCOLAX Take 17 g by mouth in the morning. Mix 17 grams of powder into 4-6 ounces of water and drink every morning- may take an additional dose (17 grams) once a day as needed for constipation   pravastatin 40 MG tablet Commonly known as: PRAVACHOL TAKE ONE TABLET BY MOUTH ONCE DAILY What changed: when to take this   sodium chloride 5 % ophthalmic ointment Commonly known as: MURO 128 Place 1 Application into both eyes at bedtime.   Turmeric 500 MG Caps Take 500 mg by mouth in the morning.   Vitamin D3 25 MCG (1000 UT) Caps Take 1,000 Units by mouth daily.       Subjective   Pt reports feeling well today. She is confused as to why she is in the hospital since she feels normal. She denies complaints. She is able to recall thorough details of her living situation, medical history, and family members contact information and her own personal birthday information. States that she was a little "wobbly" while walking but uses a rolling walker at baseline and did well with our walker here today. She had no difficulty eating breakfast.   All questions and concerns were addressed at time of discharge.  Objective  Blood pressure 104/60, pulse 85, temperature 98 F (36.7 C), temperature source Oral, resp. rate 16, height 5\' 1"  (1.549 m), weight 62.1 kg, SpO2 100 %.   General: Pt is alert, awake, not in acute distress Cardiovascular: RRR, S1/S2  +, no rubs, no gallops Respiratory: CTA bilaterally, no wheezing, no rhonchi Abdominal: Soft, NT, ND, bowel sounds + Extremities: no edema, no cyanosis  The results of significant diagnostics from this hospitalization (including imaging, microbiology, ancillary and laboratory) are listed below for reference.   Imaging studies: MR Brain W and Wo Contrast  Result Date: 12/12/2022 CLINICAL DATA:  Stroke, follow-up. Ataxia, confusion, history of meningioma in the posterior fossa. Evaluate for enlarged mass effect or posterior circulation infarct. EXAM: MRI HEAD WITHOUT AND WITH CONTRAST TECHNIQUE: Multiplanar, multiecho pulse sequences of the brain and surrounding structures were obtained without and with intravenous contrast. CONTRAST:  6mL GADAVIST GADOBUTROL 1 MMOL/ML IV SOLN COMPARISON:  CT head 1 day prior, brain MRI 11/04/2021 FINDINGS: Brain: There is no acute intracranial hemorrhage, extra-axial fluid collection, or acute infarct. Parenchymal volume loss with prominence of the ventricular system and extra-axial CSF spaces is unchanged. Patchy and confluent FLAIR signal abnormality in the supratentorial white matter likely reflects sequela of underlying moderate chronic small-vessel ischemic change,  similar to 2023. The 3.1 cm x 2.2 cm in the axial plane and up to 3.0 cm CC (11-12, 12-12) Homogeneously enhancing extra-axial lesion centered along the right aspect of the foramen magnum extending through the hypoglossal canal, along the dorsal aspect of the clivus, and dorsal aspect of the dens/C2 vertebral body appears slightly increased in size since the MRI from 11/04/2021. On the most recent contrast enhanced brain MRI from 2021, the lesion measured up to 2.7 cm x 2.0 cm in the axial plane. There remains significant mass effect on the underlying medulla which is compressed to the left but without parenchymal edema. There is partial effacement of fourth ventricle without upstream hydrocephalus. There is  no other mass lesion or abnormal enhancement. There is no midline shift. Vascular: The major flow voids are preserved. There is mass effect on the right V4 segment by the above-described extra-axial mass, unchanged. Skull and upper cervical spine: Normal marrow signal. Sinuses/Orbits: The paranasal sinuses are clear. Bilateral lens implants are in place. The globes and orbits are otherwise unremarkable. Other: The mastoid air cells and middle ear cavities are clear. IMPRESSION: 1. The meningioma centered along the right aspect of the foramina magnum appears to be slowly increasing in size over time when compared to the brain MRI from 2021. There remains significant mass effect on the underlying medulla which is compressed to the left but without parenchymal edema. 2. No acute intracranial pathology. Electronically Signed   By: Lesia Hausen M.D.   On: 12/12/2022 10:50   DG CHEST PORT 1 VIEW  Result Date: 12/11/2022 CLINICAL DATA:  Encephalopathy EXAM: PORTABLE CHEST 1 VIEW COMPARISON:  Chest x-ray 10/05/2019 FINDINGS: Heart is mildly enlarged. The lungs and costophrenic angles are clear. There is no pneumothorax or acute fracture. Left chest wall surgical clips are present. IMPRESSION: 1. No active disease. 2. Mild cardiomegaly. Electronically Signed   By: Darliss Cheney M.D.   On: 12/11/2022 23:21   CT Head Wo Contrast  Result Date: 12/11/2022 CLINICAL DATA:  Mental status changes, unknown cause. Confusion and headache. Known meningioma. EXAM: CT HEAD WITHOUT CONTRAST TECHNIQUE: Contiguous axial images were obtained from the base of the skull through the vertex without intravenous contrast. RADIATION DOSE REDUCTION: This exam was performed according to the departmental dose-optimization program which includes automated exposure control, adjustment of the mA and/or kV according to patient size and/or use of iterative reconstruction technique. COMPARISON:  CT scan head and CTA head and neck 12/04/2022 and  12/05/2022, older studies back to November 2022. FINDINGS: Brain: Again noted is a 3.1 x 3.7 x 2.9 cm gray matter isodense extra-axial posterior fossa mass centered in between the dorsal clivus and the right anterolateral aspect of the brainstem and extending into the foramina magnum. Today's measurements were obtained on axial image 7 and sagittal reconstruction image 43. The mass size is stable back to November 2022, with effacement of the fourth ventricle and right perimedullary cistern, and compression of the brainstem from the right and anteriorly. There are occasional punctate calcifications in the mass, which is probably a meningioma. There is mild-to-moderate atrophy and small-vessel disease. Moderate ventriculomegaly could be due to atrophic ventriculomegaly, chronic partial obstructive hydrocephalus or combination. The ventricles are stable in size and with no midline shift. No acute cortical based infarct, parenchymal mass, parenchymal or extra-axial hemorrhage are seen. Vascular: There are patchy calcifications in the carotid siphons and scattered calcific plaque in the distal vertebral arteries. No hyperdense central vessels are seen. Skull: Negative for fractures  or focal lesions. Sinuses/Orbits: Negative orbits apart from old lens replacements. Clear sinuses and mastoids. Other: None. IMPRESSION: 1. No acute intracranial CT findings or interval changes. 2. Stable 3.1 x 3.7 x 2.9 cm posterior fossa mass, most likely a meningioma, with associated stable mass effect. 3. Atrophy and small-vessel disease. 4. Moderate ventriculomegaly which could be due to atrophic ventriculomegaly, chronic partial obstructive hydrocephalus or combination. Stable ventricular size. Electronically Signed   By: Almira Bar M.D.   On: 12/11/2022 20:17   CT ANGIO HEAD NECK W WO CM  Result Date: 12/05/2022 CLINICAL DATA:  Initial evaluation for neuro deficit, stroke. EXAM: CT ANGIOGRAPHY HEAD AND NECK WITH AND WITHOUT  CONTRAST TECHNIQUE: Multidetector CT imaging of the head and neck was performed using the standard protocol during bolus administration of intravenous contrast. Multiplanar CT image reconstructions and MIPs were obtained to evaluate the vascular anatomy. Carotid stenosis measurements (when applicable) are obtained utilizing NASCET criteria, using the distal internal carotid diameter as the denominator. RADIATION DOSE REDUCTION: This exam was performed according to the departmental dose-optimization program which includes automated exposure control, adjustment of the mA and/or kV according to patient size and/or use of iterative reconstruction technique. CONTRAST:  75mL OMNIPAQUE IOHEXOL 350 MG/ML SOLN COMPARISON:  CT from 12/04/2022. FINDINGS: CTA NECK FINDINGS Aortic arch: Visualized aortic arch normal in caliber with standard 3 vessel morphology. Atheromatous change about the arch itself. No significant stenosis about the origin the great vessels. Right carotid system: Right common and internal carotid arteries are patent without dissection. Mild atheromatous change about the right carotid bulb without hemodynamically significant greater than 50% stenosis. Left carotid system: Left common and internal carotid arteries are patent without evidence for dissection. Minor atheromatous change about the left carotid bulb without hemodynamically significant greater than 50% stenosis. Vertebral arteries: Both vertebral arteries arise from the subclavian arteries. No proximal subclavian artery stenosis. Left vertebral artery slightly dominant. Vertebral arteries are patent without stenosis or dissection. Skeleton: No discrete or worrisome osseous lesions. Moderately advanced cervical spondylosis at C3-4 through C6-7. Other neck: No other acute abnormality within the neck. Upper chest: Scattered interlobular septal thickening within the visualized lungs, consistent with pulmonary interstitial edema. Associated small layering  bilateral pleural effusions. Few small nodules are seen clustered together at the peripheral right upper lobe, largest of which measures 5 mm (series 6, image 11), indeterminate. Review of the MIP images confirms the above findings CTA HEAD FINDINGS Anterior circulation: Mild atheromatous change about the carotid siphons without hemodynamically significant stenosis. A1 segments, anterior tibial artery complex common anterior cerebral arteries patent without stenosis. No M1 stenosis or occlusion. No proximal MCA branch occlusion or high-grade stenosis. Distal MCA branches perfused and symmetric. Posterior circulation: Both V4 segments patent without stenosis. Intradural right V4 segment is displaced posteriorly due to the extra-axial mass at the right skull base. Both PICA patent. Basilar patent without stenosis. Superior cerebellar and posterior cerebral arteries patent bilaterally. Venous sinuses: Grossly patent allowing for timing the contrast bolus. Anatomic variants: None significant.  No aneurysm. Review of the MIP images confirms the above findings IMPRESSION: 1. Negative CTA for large vessel occlusion or other emergent finding. 2. Mild atheromatous change about the carotid bifurcations and carotid siphons without hemodynamically significant or correctable stenosis. 3. Mild pulmonary interstitial edema with small layering bilateral pleural effusions. 4. Few small nodules clustered together at the peripheral right upper lobe, largest of which measures 5 mm, indeterminate. Per Fleischner Society Guidelines, if patient is low risk for malignancy, no  routine follow-up imaging is recommended. If patient is high risk for malignancy, a non-contrast Chest CT at 12 months is optional. If performed and the nodule is stable at 12 months, no further follow-up is recommended. These guidelines do not apply to immunocompromised patients and patients with cancer. Follow up in patients with significant comorbidities as  clinically warranted. For lung cancer screening, adhere to Lung-RADS guidelines. Reference: Radiology. 2017; 284(1):228-43. Electronically Signed   By: Rise Mu M.D.   On: 12/05/2022 01:29   CT Head Wo Contrast  Result Date: 12/04/2022 CLINICAL DATA:  Stroke, acute neurological deficit. EXAM: CT HEAD WITHOUT CONTRAST TECHNIQUE: Contiguous axial images were obtained from the base of the skull through the vertex without intravenous contrast. RADIATION DOSE REDUCTION: This exam was performed according to the departmental dose-optimization program which includes automated exposure control, adjustment of the mA and/or kV according to patient size and/or use of iterative reconstruction technique. COMPARISON:  07/04/2022 FINDINGS: Brain: Solid extra-axial mass within the right ventrolateral, inferior posterior fossa closely apposed to the dorsal clivus and extending into the foramen magnum demonstrates significant mass effect upon the brainstem, similar to prior examination. This mass appears stable in size measuring roughly 2.2 x 3.0 cm at axial image # 29/2. Mass effect results in effacement of the fourth ventricle, similar to prior examination, with stable mild ventriculomegaly involving the lateral and third ventricles which may reflect changes of secondary obstructing hydrocephalus. Mild periventricular white matter changes are again noted, likely reflecting the sequela of small vessel ischemia. No acute infarct or acute intracranial hemorrhage. No abnormal intra or extra-axial fluid collection. Vascular: No hyperdense vessel or unexpected calcification. Skull: Normal. Negative for fracture or focal lesion. Sinuses/Orbits: No acute finding. Other: Mastoid air cells and middle ear cavities are clear. IMPRESSION: 1. No acute intracranial hemorrhage or infarct. 2. Stable extra-axial mass within the right ventrolateral, inferior posterior fossa closely apposed to the dorsal clivus and extending into the  foramen magnum with significant mass effect upon the brainstem, similar to prior examination. Mass effect results in effacement of the fourth ventricle, with stable mild ventriculomegaly involving the lateral and third ventricles which may reflect changes of secondary obstructing hydrocephalus. 3. Mild senescent changes. Electronically Signed   By: Helyn Numbers M.D.   On: 12/04/2022 21:18    Labs: Basic Metabolic Panel: Recent Labs  Lab 12/11/22 1937 12/12/22 0859 12/13/22 0411  NA 140 139 138  K 5.2* 3.8 3.4*  CL 103 101 102  CO2 28 25 24   GLUCOSE 109* 88 98  BUN 21 13 17   CREATININE 0.86 0.77 0.92  CALCIUM 10.2 9.2 8.8*   CBC: Recent Labs  Lab 12/11/22 1937 12/12/22 0859 12/13/22 0411  WBC 9.1 10.3 8.4  NEUTROABS 5.9  --   --   HGB 17.6* 17.6* 16.5*  HCT 51.4* 50.6* 49.0*  MCV 91.8 90.4 90.1  PLT 217 181 208   Microbiology: Results for orders placed or performed during the hospital encounter of 12/11/22  Culture, blood (Routine X 2) w Reflex to ID Panel     Status: None (Preliminary result)   Collection Time: 12/12/22  1:44 AM   Specimen: BLOOD  Result Value Ref Range Status   Specimen Description   Final    BLOOD LEFT ANTECUBITAL Performed at [facility], [address] [place], [place] [address]    Special Requests   Final    BOTTLES DRAWN AEROBIC AND ANAEROBIC Blood Culture results may not be optimal due to an  inadequate volume of blood received in culture bottles Performed at [facility], [address], [place], [place] [address]    Culture   Final    NO GROWTH 1 DAY Performed at [facility], [address] [place], [place] [address]    Report Status PENDING  Incomplete  Culture, blood (Routine X 2) w Reflex to ID Panel     Status: None (Preliminary result)   Collection Time: 12/12/22  8:59 AM   Specimen: BLOOD RIGHT ARM  Result Value Ref Range Status   Specimen Description BLOOD RIGHT ARM  Final    Special Requests   Final    BOTTLES DRAWN AEROBIC ONLY Blood Culture results may not be optimal due to an inadequate volume of blood received in culture bottles   Culture   Final    NO GROWTH < 24 HOURS Performed at [facility], [address], [place], [place] [address]    Report Status PENDING  Incomplete   Time coordinating discharge: Over 30 minutes  Leeroy Bock, MD  [facility] 12/13/2022, 11:07 AM

## 2022-12-13 NOTE — Evaluation (Signed)
Occupational Therapy Evaluation Patient Details Name: Jessica Mullins MRN: [id number] DOB: October 24, 1930 Today's Date: 12/13/2022   History of Present Illness Pt is a 89 y/o female presenting with AMS in setting of known brain mass, likely benign meningioma. Not a surgical candidate per neurosurgery. PMH: a fib, breast CA, GERD, HTN, HLD, meningioma, TIA   Clinical Impression   PTA, pt from [facility] (appears to be in ILF portion), reports Modified Independence with ADLs, basic IADLs and mobility using RW. Pt presents now with deficits in cognition, strength and chronic neck pain hindering L shoulder ROM. Overall, pt able to mobilize with RW in room and manage ADLs with no more than Supervision-min guard. Pt reports difficulty with clear speech and not quite feeling like herself yet. Anticipate good progress to return to PLOF with HHOT follow up at [facility]. Plan to further assess functional cognition in next OT session.      Recommendations for follow up therapy are one component of a multi-disciplinary discharge planning process, led by the attending physician.  Recommendations may be updated based on patient status, additional functional criteria and insurance authorization.   Assistance Recommended at Discharge Intermittent Supervision/Assistance  Patient can return home with the following Assistance with cooking/housework;Direct supervision/assist for medications management;Direct supervision/assist for financial management    Functional Status Assessment  Patient has had a recent decline in their functional status and demonstrates the ability to make significant improvements in function in a reasonable and predictable amount of time.  Equipment Recommendations  None recommended by OT    Recommendations for Other Services       Precautions / Restrictions Precautions Precautions: Fall Restrictions Weight Bearing Restrictions: No      Mobility Bed Mobility Overal bed mobility:  Needs Assistance Bed Mobility: Supine to Sit     Supine to sit: Min guard     General bed mobility comments: for safety with lifting trunk    Transfers Overall transfer level: Needs assistance Equipment used: Rolling walker (2 wheels) Transfers: Sit to/from Stand Sit to Stand: Min guard, Supervision           General transfer comment: for safety progressing to Supervision      Balance Overall balance assessment: Needs assistance Sitting-balance support: No upper extremity supported, Feet supported Sitting balance-Leahy Scale: Good     Standing balance support: Bilateral upper extremity supported, During functional activity, No upper extremity supported Standing balance-Leahy Scale: Fair                             ADL either performed or assessed with clinical judgement   ADL Overall ADL's : Needs assistance/impaired Eating/Feeding: Independent   Grooming: Supervision/safety;Standing;Wash/dry face;Wash/dry hands;Brushing hair Grooming Details (indicate cue type and reason): no LOB reaching overhead Upper Body Bathing: Set up   Lower Body Bathing: Supervison/ safety;Sitting/lateral leans;Sit to/from stand   Upper Body Dressing : Set up   Lower Body Dressing: Supervision/safety;Sitting/lateral leans;Sit to/from stand   Toilet Transfer: Min guard;Ambulation;Rolling walker (2 wheels);Supervision/safety Toilet Transfer Details (indicate cue type and reason): progressing to supervision, able to stand from regular toilet without assist Toileting- Clothing Manipulation and Hygiene: Supervision/safety;Sitting/lateral lean;Sit to/from stand       Functional mobility during ADLs: Min guard;Rolling walker (2 wheels);Supervision/safety General ADL Comments: Moving fairly well, reports difficulty getting words out and not quite feeling like herself mentally     Vision Baseline Vision/History: 1 Wears glasses Ability to See in Adequate Light: 1  Impaired  Patient Visual Report: Other (comment) (reports some blurring new in past couple weeks) Vision Assessment?: Vision impaired- to be further tested in functional context Additional Comments: reports new blurriness; wears glasses for seeing/reading; denies double vision. reports some difficulty keeping eyes open in AM sometimes     Perception     Praxis      Pertinent Vitals/Pain Pain Assessment Pain Assessment: Faces Faces Pain Scale: Hurts a little bit Pain Location: chronic neck pain Pain Descriptors / Indicators: Discomfort Pain Intervention(s): Monitored during session     Hand Dominance Right   Extremity/Trunk Assessment Upper Extremity Assessment Upper Extremity Assessment: Generalized weakness;LUE deficits/detail LUE Deficits / Details: L shoulder/neck pain   Lower Extremity Assessment Lower Extremity Assessment: Defer to PT evaluation   Cervical / Trunk Assessment Cervical / Trunk Assessment: Normal;Other exceptions Cervical / Trunk Exceptions: reports chronic neck pain hindering ability to lift L UE fully   Communication Communication Communication: No difficulties   Cognition Arousal/Alertness: Awake/alert Behavior During Therapy: WFL for tasks assessed/performed Overall Cognitive Status: Impaired/Different from baseline Area of Impairment: Memory, Awareness, Problem solving                     Memory: Decreased short-term memory     Awareness: Emergent Problem Solving: Slow processing, Requires verbal cues, Difficulty sequencing General Comments: able to report month/year, being at hospital and asking appropriate questions regarding reason for hospitalization. reports feeling speech is more garbled and feeling mind is not as clear. pleasant, witty and follows directions consistently.     General Comments       Exercises     Shoulder Instructions      Home Living Family/patient expects to be discharged to:: Other (Comment)                                  Additional Comments: [facility]      Prior Functioning/Environment Prior Level of Function : Independent/Modified Independent;Patient poor historian/Family not available             Mobility Comments: reports use of RW for mobility ADLs Comments: reports managing ADLs, simple meals, meds; appeared confused when asked if she had a dining hall she could go to at [employer]        OT Problem List: Decreased strength;Decreased activity tolerance;Impaired balance (sitting and/or standing);Decreased cognition;Pain      OT Treatment/Interventions: Self-care/ADL training;Therapeutic exercise;Energy conservation;DME and/or AE instruction;Therapeutic activities;Patient/family education    OT Goals(Current goals can be found in the care plan section) Acute Rehab OT Goals Patient Stated Goal: get mind cleared up OT Goal Formulation: With patient Time For Goal Achievement: 12/27/22 Potential to Achieve Goals: Good ADL Goals Additional ADL Goal #1: Pt to complete pillbox test with 0 errors Additional ADL Goal #2: Pt to gather ADL/iADL items MOD I without LOB or safety concerns  OT Frequency: Min 2X/week    Co-evaluation              AM-PAC OT "6 Clicks" Daily Activity     Outcome Measure Help from another person eating meals?: None Help from another person taking care of personal grooming?: A Little Help from another person toileting, which includes using toliet, bedpan, or urinal?: A Little Help from another person bathing (including washing, rinsing, drying)?: A Little Help from another person to put on and taking off regular upper body clothing?: A Little Help from another person to put on  and taking off regular lower body clothing?: A Little 6 Click Score: 19   End of Session Equipment Utilized During Treatment: Gait belt;Rolling walker (2 wheels) Nurse Communication: Mobility status  Activity Tolerance: Patient tolerated treatment  well Patient left: in chair;with call bell/phone within reach;with chair alarm set  OT Visit Diagnosis: Unsteadiness on feet (R26.81);Other abnormalities of gait and mobility (R26.89);Muscle weakness (generalized) (M62.81);Other symptoms and signs involving cognitive function                Time: 0732-0801 OT Time Calculation (min): 29 min Charges:  OT General Charges $OT Visit: 1 Visit OT Evaluation $OT Eval Moderate Complexity: 1 Mod OT Treatments $Self Care/Home Management : 8-22 mins  Bradd Canary, OTR/L Acute Rehab Services Office: [phone]   Lorre Munroe 12/13/2022, 8:24 AM

## 2022-12-13 NOTE — TOC Transition Note (Signed)
Transition of Care [facility]) - CM/SW Discharge Note   Patient Details  Name: Jessica Mullins MRN: [id number] Date of Birth: 12-16-1930  [facility] CM/SW Contact:  Baldemar Lenis, LCSW Phone Number: 12/13/2022, 12:07 PM   Clinical Narrative:   CSW updated that patient medically stable to return to ALF. CSW spoke with [facility], they reviewed notes and discussed that they agree patient can return to ALF and not need rehab. [facility] will arrange transportation for patient to return, will pick up around 3:30. CSW updated RN. CSW spoke with daughter, [name], to provide update; she is in agreement.   Nurse to call report to [phone].     Final next level of care: Assisted Living Barriers to Discharge: Barriers Resolved   Patient Goals and CMS Choice CMS Medicare.gov Compare Post Acute Care list provided to:: Patient Represenative (must comment)    Discharge Placement                         Discharge Plan and Services Additional resources added to the After Visit Summary for       Post Acute Care Choice: NA                               Social Determinants of Health (SDOH) Interventions SDOH Screenings   Food Insecurity: No Food Insecurity (12/12/2022)  Housing: Patient Declined (12/12/2022)  Transportation Needs: No Transportation Needs (12/12/2022)  Utilities: Not At Risk (12/12/2022)  Depression (PHQ2-9): Low Risk  (11/22/2022)  Financial Resource Strain: Low Risk  (11/14/2019)  Tobacco Use: Low Risk  (12/13/2022)     Readmission Risk Interventions     No data to display

## 2022-12-13 NOTE — Evaluation (Signed)
Physical Therapy Evaluation Patient Details Name: Jessica Mullins MRN: [id number] DOB: 1930-08-09 Today's Date: 12/13/2022  History of Present Illness  Pt is a 89 y/o female presenting with AMS in setting of known brain mass, likely benign meningioma. Not a surgical candidate per neurosurgery. PMH: a fib, breast CA, GERD, HTN, HLD, meningioma, TIA   Clinical Impression  Jessica Mullins is 89 y.o. female admitted with above HPI and diagnosis. Patient is currently limited by functional impairments below (see PT problem list). Patient lives at [facility] and is independent with RW for mobility and with all ADL's at baseline. Currently pt requires min guard/supervision for transfers and min guard/assist for amb with RW in hallway to avoid obstacles safely. Patient will benefit from continued skilled PT interventions to address impairments and progress independence with mobility. Acute PT will follow and progress as able.        Recommendations for follow up therapy are one component of a multi-disciplinary discharge planning process, led by the attending physician.  Recommendations may be updated based on patient status, additional functional criteria and insurance authorization.  Follow Up Recommendations       Assistance Recommended at Discharge Frequent or constant Supervision/Assistance  Patient can return home with the following  A little help with walking and/or transfers;A little help with bathing/dressing/bathroom;Assistance with cooking/housework;Direct supervision/assist for medications management;Assist for transportation;Help with stairs or ramp for entrance    Equipment Recommendations None recommended by PT  Recommendations for Other Services       Functional Status Assessment Patient has had a recent decline in their functional status and demonstrates the ability to make significant improvements in function in a reasonable and predictable amount of time.     Precautions  / Restrictions Precautions Precautions: Fall Restrictions Weight Bearing Restrictions: No      Mobility  Bed Mobility               General bed mobility comments: pt OOB in recliner    Transfers Overall transfer level: Needs assistance Equipment used: Rolling walker (2 wheels) Transfers: Sit to/from Stand Sit to Stand: Min guard, Supervision           General transfer comment: pt with use of hand from recliner. able to compelete 5xsit<>stand with hands on knees.    Ambulation/Gait Ambulation/Gait assistance: Min assist Gait Distance (Feet): 240 Feet Assistive device: Rolling walker (2 wheels) Gait Pattern/deviations: Step-through pattern, Decreased stride length, Decreased step length - right, Decreased step length - left, Trunk flexed Gait velocity: decr     General Gait Details: pt with decr gait speed, no LOB but silght drift Lt and slightly flexed posture. Cues for proximity to RW and to navigate hallway safely as pt tending to disregard obstacles and bump walker into them requiring assist to avoid/manage.  Stairs            Wheelchair Mobility    Modified Rankin (Stroke Patients Only)       Balance Overall balance assessment: Needs assistance Sitting-balance support: No upper extremity supported, Feet supported Sitting balance-Leahy Scale: Good     Standing balance support: Bilateral upper extremity supported, During functional activity, No upper extremity supported Standing balance-Leahy Scale: Fair                               Pertinent Vitals/Pain Pain Assessment Pain Assessment: No/denies pain Pain Intervention(s): Limited activity within patient's tolerance, Monitored during session, Repositioned  Home Living Family/patient expects to be discharged to:: Other (Comment)                   Additional Comments: [facility]    Prior Function Prior Level of Function : Independent/Modified Independent;Patient  poor historian/Family not available             Mobility Comments: reports use of RW for mobility ADLs Comments: reports managing ADLs, simple meals, meds; appeared confused when asked if she had a dining hall she could go to at [employer]     Hand Dominance   Dominant Hand: Right    Extremity/Trunk Assessment   Upper Extremity Assessment Upper Extremity Assessment: Defer to OT evaluation LUE Deficits / Details: L shoulder/neck pain    Lower Extremity Assessment Lower Extremity Assessment: RLE deficits/detail;LLE deficits/detail;Generalized weakness RLE Deficits / Details: 4/5 grossly with hip flex, knee ext/flex, DF RLE Sensation: WNL RLE Coordination: WNL LLE Deficits / Details: 4-/5 for hip flex, knee flex/ext, DF LLE Sensation: WNL LLE Coordination: WNL    Cervical / Trunk Assessment Cervical / Trunk Assessment: Normal;Other exceptions Cervical / Trunk Exceptions: reports chronic neck pain hindering ability to lift L UE fully  Communication   Communication: No difficulties  Cognition Arousal/Alertness: Awake/alert Behavior During Therapy: WFL for tasks assessed/performed Overall Cognitive Status: Impaired/Different from baseline Area of Impairment: Memory, Awareness, Problem solving, Safety/judgement                     Memory: Decreased short-term memory   Safety/Judgement: Decreased awareness of safety Awareness: Emergent Problem Solving: Slow processing, Requires verbal cues, Difficulty sequencing General Comments: able to report month/year, being at hospital and asking appropriate questions regarding reason for hospitalization. pleasant, witty and follows directions consistently.        General Comments General comments (skin integrity, edema, etc.): 5xSit<>Stand: UE's on thighs; 15.9 seconds.    Exercises     Assessment/Plan    PT Assessment Patient needs continued PT services  PT Problem List Decreased strength;Decreased activity  tolerance;Decreased balance;Decreased mobility;Decreased cognition;Decreased knowledge of use of DME;Decreased safety awareness       PT Treatment Interventions DME instruction;Gait training;Stair training;Functional mobility training;Therapeutic activities;Therapeutic exercise;Balance training;Neuromuscular re-education;Cognitive remediation;Patient/family education;Wheelchair mobility training    PT Goals (Current goals can be found in the Care Plan section)  Acute Rehab PT Goals Patient Stated Goal: get back to [facility] PT Goal Formulation: With patient Time For Goal Achievement: 12/27/22 Potential to Achieve Goals: Good    Frequency Min 3X/week     Co-evaluation               AM-PAC PT "6 Clicks" Mobility  Outcome Measure Help needed turning from your back to your side while in a flat bed without using bedrails?: A Little Help needed moving from lying on your back to sitting on the side of a flat bed without using bedrails?: A Little Help needed moving to and from a bed to a chair (including a wheelchair)?: A Little Help needed standing up from a chair using your arms (e.g., wheelchair or bedside chair)?: A Little Help needed to walk in hospital room?: A Little Help needed climbing 3-5 steps with a railing? : A Little 6 Click Score: 18    End of Session Equipment Utilized During Treatment: Gait belt Activity Tolerance: Patient tolerated treatment well Patient left: in chair;with call bell/phone within reach;with chair alarm set Nurse Communication: Mobility status PT Visit Diagnosis: Other abnormalities of gait and mobility (R26.89);Muscle weakness (  generalized) (M62.81);Difficulty in walking, not elsewhere classified (R26.2);Unsteadiness on feet (R26.81)    Time: 1010-1027 PT Time Calculation (min) (ACUTE ONLY): 17 min   Charges:   PT Evaluation $PT Eval Low Complexity: 1 Low          Wynn Maudlin, DPT Acute Rehabilitation Services Office  [phone]  12/13/22 11:23 AM

## 2022-12-13 NOTE — TOC Initial Note (Signed)
Transition of Care [facility]) - Initial/Assessment Note    Patient Details  Name: Jessica Mullins MRN: [id number] Date of Birth: [birth year]  [facility] CM/SW Contact:    Baldemar Lenis, LCSW Phone Number: 12/13/2022, 10:59 AM  Clinical Narrative:       CSW noting per chart review that patient is from [facility]. CSW contacted [facility] social worker Lupita Leash to ask about patient's prior living situation. Patient is from ALF, and would probably prefer to return to ALF if still safe to do so. Facility can assist with home health or caregiver support, if need be, and could look at SNF if needed; awaiting therapy evals for patient. [facility] also asking about possibility of palliative consult, with patient's medical issues, to determine goals of care moving forward when patient returns. CSW sent information to MD. CSW to follow.           Expected Discharge Plan: Assisted Living Barriers to Discharge: Continued Medical Work up   Patient Goals and CMS Choice Patient states their goals for this hospitalization and ongoing recovery are:: patient unable to participate in goal setting, not oriented CMS Medicare.gov Compare Post Acute Care list provided to:: Patient Represenative (must comment)        Expected Discharge Plan and Services     Post Acute Care Choice: NA Living arrangements for the past 2 months: Assisted Living Facility                                      Prior Living Arrangements/Services Living arrangements for the past 2 months: Assisted Living Facility Lives with:: Facility Resident                   Activities of Daily Living Home Assistive Devices/Equipment: Environmental consultant (specify type) (rollator) ADL Screening (condition at time of admission) Patient's cognitive ability adequate to safely complete daily activities?: No Is the patient deaf or have difficulty hearing?: No Does the patient have difficulty seeing, even when wearing  glasses/contacts?: No Does the patient have difficulty concentrating, remembering, or making decisions?: Yes Patient able to express need for assistance with ADLs?: Yes Does the patient have difficulty dressing or bathing?: No Independently performs ADLs?: Yes (appropriate for developmental age) Does the patient have difficulty walking or climbing stairs?: No Weakness of Legs: None Weakness of Arms/Hands: None  Permission Sought/Granted                  Emotional Assessment              Admission diagnosis:  Encephalopathy [G93.40] Altered mental status, unspecified altered mental status type [R41.82] Patient Active Problem List   Diagnosis Date Noted   Altered mental status 12/12/2022   Encephalopathy 12/11/2022   Gait abnormality 06/13/2022   Hx of meningioma of the brain 06/13/2022   Drug-induced bradycardia 06/03/2021   Acute CVA (cerebrovascular accident) (HCC) 05/10/2021   Sensorineural hearing loss (SNHL) of both ears 10/28/2020   Senile osteopenia 01/12/2020   History of breast cancer 01/12/2020   Meningioma (HCC) 01/12/2020   History of melanoma 01/12/2020   Right lumbar radiculitis 01/12/2020   Balance problem 04/05/2019   Constipation by delayed colonic transit 12/13/2017   Hemorrhoids 02/14/2017   Atrial fibrillation (HCC) 08/10/2015   Orthostatic hypotension 08/04/2015   Genetic testing 07/27/2015   Family history of cancer    Cancer of central portion of left female breast (  HCC) 06/01/2015   Bilateral low back pain without sciatica 11/18/2014   Right shoulder pain 04/10/2014   Muscle spasm of back 01/13/2014   Olecranon bursitis of left elbow 08/26/2013   Posterolateral cervical muscle strain 12/14/2012   Hyperlipidemia 02/16/2010   TMJ PAIN 02/16/2010   SEBORRHEIC KERATOSIS 08/24/2009   Essential hypertension 10/18/2007   OSTEOPENIA 07/12/2007   Melanoma of skin (HCC) 10/17/2006   BREAST CYST 10/17/2006   COLONOSCOPY, HX OF 10/17/2006    PCP:  Mahlon Gammon, MD Pharmacy:   [facility] [place], [place] - [phone] [place] [address] Phone: [phone] Fax: [phone]     Social Determinants of Health (SDOH) Social History: SDOH Screenings   Food Insecurity: No Food Insecurity (12/12/2022)  Housing: Patient Declined (12/12/2022)  Transportation Needs: No Transportation Needs (12/12/2022)  Utilities: Not At Risk (12/12/2022)  Depression (PHQ2-9): Low Risk  (11/22/2022)  Financial Resource Strain: Low Risk  (11/14/2019)  Tobacco Use: Low Risk  (12/13/2022)   SDOH Interventions:     Readmission Risk Interventions     No data to display

## 2022-12-13 NOTE — Progress Notes (Signed)
Providing Compassionate, Quality Care - Together   Subjective: Patient is siting up in her chair. She reports dizziness with ambulation. She feels she is having difficulty with her short term memory.  Objective: Vital signs in last 24 hours: Temp:  [97.6 F (36.4 C)-98.4 F (36.9 C)] 98 F (36.7 C) (06/18 0821) Pulse Rate:  [63-95] 85 (06/18 0821) Resp:  [16-18] 16 (06/18 0821) BP: (99-145)/(60-100) 104/60 (06/18 0821) SpO2:  [96 %-100 %] 100 % (06/18 0821) Weight:  [62.1 kg] 62.1 kg (06/17 1115)  Intake/Output from previous day: 06/17 0701 - 06/18 0700 In: 960 [P.O.:960] Out: 100 [Urine:100] Intake/Output this shift: Total I/O In: 240 [P.O.:240] Out: 0   Oriented to self, place, time, and situation PERRLA Speech clear, some repetition MAE.S/S intact  Lab Results: Recent Labs    12/12/22 0859 12/13/22 0411  WBC 10.3 8.4  HGB 17.6* 16.5*  HCT 50.6* 49.0*  PLT 181 208   BMET Recent Labs    12/12/22 0859 12/13/22 0411  NA 139 138  K 3.8 3.4*  CL 101 102  CO2 25 24  GLUCOSE 88 98  BUN 13 17  CREATININE 0.77 0.92  CALCIUM 9.2 8.8*    Studies/Results: MR Brain W and Wo Contrast  Result Date: 12/12/2022 CLINICAL DATA:  Stroke, follow-up. Ataxia, confusion, history of meningioma in the posterior fossa. Evaluate for enlarged mass effect or posterior circulation infarct. EXAM: MRI HEAD WITHOUT AND WITH CONTRAST TECHNIQUE: Multiplanar, multiecho pulse sequences of the brain and surrounding structures were obtained without and with intravenous contrast. CONTRAST:  6mL GADAVIST GADOBUTROL 1 MMOL/ML IV SOLN COMPARISON:  CT head 1 day prior, brain MRI 11/04/2021 FINDINGS: Brain: There is no acute intracranial hemorrhage, extra-axial fluid collection, or acute infarct. Parenchymal volume loss with prominence of the ventricular system and extra-axial CSF spaces is unchanged. Patchy and confluent FLAIR signal abnormality in the supratentorial white matter likely reflects  sequela of underlying moderate chronic small-vessel ischemic change, similar to 2023. The 3.1 cm x 2.2 cm in the axial plane and up to 3.0 cm CC (11-12, 12-12) Homogeneously enhancing extra-axial lesion centered along the right aspect of the foramen magnum extending through the hypoglossal canal, along the dorsal aspect of the clivus, and dorsal aspect of the dens/C2 vertebral body appears slightly increased in size since the MRI from 11/04/2021. On the most recent contrast enhanced brain MRI from 2021, the lesion measured up to 2.7 cm x 2.0 cm in the axial plane. There remains significant mass effect on the underlying medulla which is compressed to the left but without parenchymal edema. There is partial effacement of fourth ventricle without upstream hydrocephalus. There is no other mass lesion or abnormal enhancement. There is no midline shift. Vascular: The major flow voids are preserved. There is mass effect on the right V4 segment by the above-described extra-axial mass, unchanged. Skull and upper cervical spine: Normal marrow signal. Sinuses/Orbits: The paranasal sinuses are clear. Bilateral lens implants are in place. The globes and orbits are otherwise unremarkable. Other: The mastoid air cells and middle ear cavities are clear. IMPRESSION: 1. The meningioma centered along the right aspect of the foramina magnum appears to be slowly increasing in size over time when compared to the brain MRI from 2021. There remains significant mass effect on the underlying medulla which is compressed to the left but without parenchymal edema. 2. No acute intracranial pathology. Electronically Signed   By: Lesia Hausen M.D.   On: 12/12/2022 10:50   DG CHEST  PORT 1 VIEW  Result Date: 12/11/2022 CLINICAL DATA:  Encephalopathy EXAM: PORTABLE CHEST 1 VIEW COMPARISON:  Chest x-ray 10/05/2019 FINDINGS: Heart is mildly enlarged. The lungs and costophrenic angles are clear. There is no pneumothorax or acute fracture. Left chest  wall surgical clips are present. IMPRESSION: 1. No active disease. 2. Mild cardiomegaly. Electronically Signed   By: Darliss Cheney M.D.   On: 12/11/2022 23:21   CT Head Wo Contrast  Result Date: 12/11/2022 CLINICAL DATA:  Mental status changes, unknown cause. Confusion and headache. Known meningioma. EXAM: CT HEAD WITHOUT CONTRAST TECHNIQUE: Contiguous axial images were obtained from the base of the skull through the vertex without intravenous contrast. RADIATION DOSE REDUCTION: This exam was performed according to the departmental dose-optimization program which includes automated exposure control, adjustment of the mA and/or kV according to patient size and/or use of iterative reconstruction technique. COMPARISON:  CT scan head and CTA head and neck 12/04/2022 and 12/05/2022, older studies back to November 2022. FINDINGS: Brain: Again noted is a 3.1 x 3.7 x 2.9 cm gray matter isodense extra-axial posterior fossa mass centered in between the dorsal clivus and the right anterolateral aspect of the brainstem and extending into the foramina magnum. Today's measurements were obtained on axial image 7 and sagittal reconstruction image 43. The mass size is stable back to November 2022, with effacement of the fourth ventricle and right perimedullary cistern, and compression of the brainstem from the right and anteriorly. There are occasional punctate calcifications in the mass, which is probably a meningioma. There is mild-to-moderate atrophy and small-vessel disease. Moderate ventriculomegaly could be due to atrophic ventriculomegaly, chronic partial obstructive hydrocephalus or combination. The ventricles are stable in size and with no midline shift. No acute cortical based infarct, parenchymal mass, parenchymal or extra-axial hemorrhage are seen. Vascular: There are patchy calcifications in the carotid siphons and scattered calcific plaque in the distal vertebral arteries. No hyperdense central vessels are seen.  Skull: Negative for fractures or focal lesions. Sinuses/Orbits: Negative orbits apart from old lens replacements. Clear sinuses and mastoids. Other: None. IMPRESSION: 1. No acute intracranial CT findings or interval changes. 2. Stable 3.1 x 3.7 x 2.9 cm posterior fossa mass, most likely a meningioma, with associated stable mass effect. 3. Atrophy and small-vessel disease. 4. Moderate ventriculomegaly which could be due to atrophic ventriculomegaly, chronic partial obstructive hydrocephalus or combination. Stable ventricular size. Electronically Signed   By: Almira Bar M.D.   On: 12/11/2022 20:17    Assessment/Plan: Patient presented to the [facility] ED with dizziness and difficulty ambulating. She has a known meningioma, but is not a surgical candidate. She is followed by Neurology. Her imaging from 6/16 is unchanged from her imaging performed on 12/04/2022.   LOS: 2 days   -Patient can follow up as an outpatient with Dr. Maurice Small if she would like to further discuss her meningioma and available treatment options. His information has been added to the AVS. -No acute interventions warranted at this time.    Val Eagle, DNP, AGNP-C Nurse Practitioner  [facility] [address], [place], [place] [address] P: [phone]    F: [phone]  12/13/2022, 10:43 AM

## 2022-12-13 NOTE — NC FL2 (Signed)
[place] MEDICAID FL2 LEVEL OF CARE FORM     IDENTIFICATION  Patient Name: Jessica Mullins Birthdate: [birth year] Sex: female Admission Date (Current Location): 12/11/2022  [facility] and [place] Number:  Producer, television/film/video and Address:  [facility], [address], [place], [place] [address]      Provider Number: [id number]  Attending Physician Name and Address:  Leeroy Bock, MD  Relative Name and Phone Number:       Current Level of Care: Hospital Recommended Level of Care: Assisted Living Facility Prior Approval Number:    Date Approved/Denied:   PASRR Number:    Discharge Plan: Other (Comment) (ALF)    Current Diagnoses: Patient Active Problem List   Diagnosis Date Noted   Altered mental status 12/12/2022   Encephalopathy 12/11/2022   Gait abnormality 06/13/2022   Hx of meningioma of the brain 06/13/2022   Drug-induced bradycardia 06/03/2021   Acute CVA (cerebrovascular accident) (HCC) 05/10/2021   Sensorineural hearing loss (SNHL) of both ears 10/28/2020   Senile osteopenia 01/12/2020   History of breast cancer 01/12/2020   Meningioma (HCC) 01/12/2020   History of melanoma 01/12/2020   Right lumbar radiculitis 01/12/2020   Balance problem 04/05/2019   Constipation by delayed colonic transit 12/13/2017   Hemorrhoids 02/14/2017   Atrial fibrillation (HCC) 08/10/2015   Orthostatic hypotension 08/04/2015   Genetic testing 07/27/2015   Family history of cancer    Cancer of central portion of left female breast (HCC) 06/01/2015   Bilateral low back pain without sciatica 11/18/2014   Right shoulder pain 04/10/2014   Muscle spasm of back 01/13/2014   Olecranon bursitis of left elbow 08/26/2013   Posterolateral cervical muscle strain 12/14/2012   Hyperlipidemia 02/16/2010   TMJ PAIN 02/16/2010   SEBORRHEIC KERATOSIS 08/24/2009   Essential hypertension 10/18/2007   OSTEOPENIA 07/12/2007   Melanoma of skin (HCC) 10/17/2006    BREAST CYST 10/17/2006   COLONOSCOPY, HX OF 10/17/2006    Orientation RESPIRATION BLADDER Height & Weight     Self  Normal Incontinent Weight: 136 lb 14.5 oz (62.1 kg) Height:  5\' 1"  (154.9 cm)  BEHAVIORAL SYMPTOMS/MOOD NEUROLOGICAL BOWEL NUTRITION STATUS      Continent Diet (regular)  AMBULATORY STATUS COMMUNICATION OF NEEDS Skin   Limited Assist Verbally Normal                       Personal Care Assistance Level of Assistance  Bathing, Feeding, Dressing Bathing Assistance: Limited assistance Feeding assistance: Limited assistance Dressing Assistance: Limited assistance     Functional Limitations Info  Hearing   Hearing Info: Impaired      SPECIAL CARE FACTORS FREQUENCY  PT (By licensed PT), OT (By licensed OT)     PT Frequency: eval and treat OT Frequency: eval and treat            Contractures Contractures Info: Not present    Additional Factors Info  Code Status, Allergies Code Status Info: DNR Allergies Info: Sulfa Antibiotics, Tape, Wound Dressing Adhesive, Glucosamine Forte (Nutritional Supplements), Latex           Current Medications (12/13/2022):  This is the current hospital active medication list Current Facility-Administered Medications  Medication Dose Route Frequency Provider Last Rate Last Admin   acetaminophen (TYLENOL) tablet 650 mg  650 mg Oral Q6H PRN Alan Mulder, MD   650 mg at 12/12/22 1058   Or   acetaminophen (TYLENOL) suppository 650 mg  650 mg  Rectal Q6H PRN Alan Mulder, MD       apixaban Everlene Balls) tablet 5 mg  5 mg Oral BID Alan Mulder, MD   5 mg at 12/13/22 4098   cyanocobalamin (VITAMIN B12) tablet 1,000 mcg  1,000 mcg Oral Daily Alan Mulder, MD   1,000 mcg at 12/13/22 1191   cycloSPORINE (RESTASIS) 0.05 % ophthalmic emulsion 1 drop  1 drop Both Eyes BID Alan Mulder, MD   1 drop at 12/13/22 0959   metoprolol succinate (TOPROL-XL) 24 hr tablet 25 mg  25 mg Oral Daily Alan Mulder, MD   25 mg at 12/13/22  0958   potassium chloride SA (KLOR-CON M) CR tablet 40 mEq  40 mEq Oral BID Leeroy Bock, MD   40 mEq at 12/13/22 0958   pravastatin (PRAVACHOL) tablet 40 mg  40 mg Oral Florence Canner, MD   40 mg at 12/12/22 2204   sodium chloride (MURO 128) 5 % ophthalmic solution 1 drop  1 drop Both Eyes QHS Cathleen Corti, MD   1 drop at 12/12/22 2329     Discharge Medications: STOP taking these medications     sennosides-docusate sodium 8.6-50 MG tablet Commonly known as: SENOKOT-S           TAKE these medications     acetaminophen 325 MG tablet Commonly known as: TYLENOL Take 325 mg by mouth every 6 (six) hours as needed for moderate pain.    Biotin 5 MG Caps Take 5 mg by mouth daily.    Calcium 600+D3 600-10 MG-MCG Tabs Generic drug: Calcium Carb-Cholecalciferol Take 1 tablet by mouth in the morning.    cyanocobalamin 1000 MCG tablet Commonly known as: VITAMIN B12 Take 1 tablet (1,000 mcg total) by mouth daily.    cycloSPORINE 0.05 % ophthalmic emulsion Commonly known as: RESTASIS Place 1 drop into both eyes See admin instructions. Instill 1 drop into each eye two times a day, after applying a hot compress for 3-5 minutes beforehand    Eliquis 5 MG Tabs tablet Generic drug: apixaban TAKE ONE TABLET BY MOUTH TWICE DAILY What changed: how much to take    FISH OIL PO Take 1 capsule by mouth daily after breakfast.    loperamide 2 MG tablet Commonly known as: IMODIUM A-D Take 2 mg by mouth 3 (three) times daily as needed for diarrhea or loose stools.    metoprolol succinate 25 MG 24 hr tablet Commonly known as: Toprol XL Take 25 mg every morning What changed:  how much to take how to take this when to take this additional instructions    multivitamin with minerals tablet Take 2 tablets by mouth daily with breakfast.    NONFORMULARY OR COMPOUNDED ITEM Apply 1 application  topically See admin instructions. Nystatin + Zinc Cream 1:1 ratio- Apply to  perirectal area once a day as needed for rashes    polyethylene glycol 17 g packet Commonly known as: MIRALAX / GLYCOLAX Take 17 g by mouth in the morning. Mix 17 grams of powder into 4-6 ounces of water and drink every morning- may take an additional dose (17 grams) once a day as needed for constipation    pravastatin 40 MG tablet Commonly known as: PRAVACHOL TAKE ONE TABLET BY MOUTH ONCE DAILY What changed: when to take this    sodium chloride 5 % ophthalmic ointment Commonly known as: MURO 128 Place 1 Application into both eyes at bedtime.    Turmeric 500 MG Caps Take 500 mg by mouth  in the morning.    Vitamin D3 25 MCG (1000 UT) Caps Take 1,000 Units by mouth daily.    Relevant Imaging Results:  Relevant Lab Results:   Additional Information SS#: [id number]  Baldemar Lenis, LCSW

## 2022-12-14 ENCOUNTER — Encounter: Payer: Self-pay | Admitting: Orthopedic Surgery

## 2022-12-14 ENCOUNTER — Non-Acute Institutional Stay: Payer: Medicare PPO | Admitting: Orthopedic Surgery

## 2022-12-14 DIAGNOSIS — R269 Unspecified abnormalities of gait and mobility: Secondary | ICD-10-CM | POA: Diagnosis not present

## 2022-12-14 DIAGNOSIS — R202 Paresthesia of skin: Secondary | ICD-10-CM | POA: Diagnosis not present

## 2022-12-14 DIAGNOSIS — I480 Paroxysmal atrial fibrillation: Secondary | ICD-10-CM

## 2022-12-14 DIAGNOSIS — D329 Benign neoplasm of meninges, unspecified: Secondary | ICD-10-CM

## 2022-12-14 DIAGNOSIS — R42 Dizziness and giddiness: Secondary | ICD-10-CM

## 2022-12-14 DIAGNOSIS — I10 Essential (primary) hypertension: Secondary | ICD-10-CM | POA: Diagnosis not present

## 2022-12-14 NOTE — Progress Notes (Signed)
A user error has taken place.

## 2022-12-14 NOTE — Progress Notes (Signed)
Location:  [facility] Room Number: 621-A Place of Service:  ALF [phone]) Provider: Hazle Nordmann, NP  Code Status: DNR Goals of Care:     12/14/2022   10:56 AM  Advanced Directives  Does Patient Have a Medical Advance Directive? Yes  Type of Estate agent of [place];Living will;Out of facility DNR (pink MOST or yellow form)  Does patient want to make changes to medical advance directive? No - Patient declined  Copy of Healthcare Power of Attorney in Chart? Yes - validated most recent copy scanned in chart (See row information)     Chief Complaint  Patient presents with   Hospitalization Follow-up    Hospital follow up.     HPI: Patient is a 89 y.o. female seen today s/p hospitalization from [facility] 06/16-06/18 due to altered mental status.    She currently resides on the assisted living unit at [facility]. PMH: PAF, HTN, HLD, left breast cancer, meningioma, osteopenia, and gait abnormality.   "06/09 she presented to the ED for paresthesias, specifically LLE weakness. CT head revealed no acute intracranial hemorrhage/infarct, meningioma and hydrocephalus noted. Neuro and neurosurgery consulted. No changes with meningioma, obstructive hydrocephalus not severe. MRI brain recommended. Cbc/diff, cmp, troponin, flu, covid unremarkable. Magnesium 2.5. UA normal. She was discharged back to [facility] and advised to f/u with neurology."   06/12 she was seen by neurology. MRI brain ordered.   06/16 she was sent to ED due to ongoing dizziness and trouble ambulating. C/o headache on arrival. Lab work unremarkable. CT head revealed 3.1 cm x 3.7 cm brain mass. Neurosurgery was consulted> recommended MRI brain. 06/07 MRI brain noted slowly increasing meningioma over right foramina magnum. Neurosurgery did not recommend surgical intervention. She was discharged back to [place], [facility] PT/OT recommended.   Today, she c/o intermittent dizziness. No  recent falls. She is ambulating AL and IL unit well. Orthostatic blood pressures done> see below. Appetite fair. Afebrile. Vitals stable.   Orthostatic blood pressures 06/19:  Laying 132/69  Sitting 129/77  Standing 130/75    Past Medical History:  Diagnosis Date   Allergy    Ankle fracture, left    Atrial fibrillation (HCC)    Per [facility] New Patient Packet    Breast cancer (HCC) 1115/16   left    Bursitis of right shoulder    Cataract    Family history of cancer    Fracture of right wrist    GERD (gastroesophageal reflux disease)    Hyperlipidemia    Hypertension    Lumbar radiculitis    Per [facility] New Patient Packet    Meningioma [facility]    Per [facility] New Patient Packet    Skin cancer 2000   melanoma and basal cell   Stress fracture    Right Heel, Per [facility] New Patient Packet    TIA (transient ischemic attack) 2022   Vaso vagal episode    during preparation for colonoscopy    Past Surgical History:  Procedure Laterality Date   ABDOMINAL HYSTERECTOMY  1988   TAH/BSO--FIBROIDS   APPENDECTOMY     BREAST LUMPECTOMY     B/L--[facility]   BREAST LUMPECTOMY WITH RADIOACTIVE SEED AND SENTINEL LYMPH NODE BIOPSY Left 06/11/2015   Procedure: LEFT BREAST LUMPECTOMY WITH RADIOACTIVE SEED AND LEFT SENTINEL LYMPH NODE MAPPING;  Surgeon: Harriette Bouillon, MD;  Location: [facility] OR;  Service: General;  Laterality: Left;   CARDIOVERSION N/A 01/11/2022   Procedure: CARDIOVERSION;  Surgeon: Jodelle Red, MD;  Location: [facility]  ENDOSCOPY;  Service: Cardiovascular;  Laterality: N/A;   CATARACT EXTRACTION  2015   COLONOSCOPY  2010   DG  BONE DENSITY ([facility] HX)     EYE SURGERY Bilateral    cataracts   fiberadenoma Bilateral 1978, 1980   GANGLION CYST EXCISION     L  hand   Lipiflow procedure      Allergies  Allergen Reactions   Sulfa Antibiotics Other (See Comments)    As a child, became "rigid as a stick and not responsive"   Tape Other (See Comments)    Blisters, Please use "paper" tape only for  short periods of time   Wound Dressing Adhesive Other (See Comments)    Blisters, Please use "paper" tape only for short periods of time, Blisters, Please use "paper" tape only for short periods of time   Glucosamine Forte [Nutritional Supplements] Rash   Latex Rash    Outpatient Encounter Medications as of 12/14/2022  Medication Sig   acetaminophen (TYLENOL) 325 MG tablet Take 325 mg by mouth every 6 (six) hours as needed for moderate pain.   Biotin 5 MG CAPS Take 5 mg by mouth daily.    Calcium Carb-Cholecalciferol (CALCIUM 600+D3) 600-10 MG-MCG TABS Take 1 tablet by mouth in the morning.   Cholecalciferol (VITAMIN D3) 1000 UNITS CAPS Take 1,000 Units by mouth daily.   cycloSPORINE (RESTASIS) 0.05 % ophthalmic emulsion Place 1 drop into both eyes See admin instructions. Instill 1 drop into each eye two times a day, after applying a hot compress for 3-5 minutes beforehand   ELIQUIS 5 MG TABS tablet TAKE ONE TABLET BY MOUTH TWICE DAILY   loperamide (IMODIUM A-D) 2 MG tablet Take 2 mg by mouth 3 (three) times daily as needed for diarrhea or loose stools.   metoprolol succinate (TOPROL XL) 25 MG 24 hr tablet Take 25 mg every morning   Multiple Vitamins-Minerals (MULTIVITAMIN WITH MINERALS) tablet Take 2 tablets by mouth daily with breakfast.   NONFORMULARY OR COMPOUNDED ITEM Apply 1 application  topically See admin instructions. Nystatin + Zinc Cream 1:1 ratio- Apply to perirectal area once a day as needed for rashes   Omega-3 Fatty Acids (FISH OIL PO) Take 1 capsule by mouth daily after breakfast.   polyethylene glycol (MIRALAX / GLYCOLAX) 17 g packet Take 17 g by mouth in the morning. Mix 17 grams of powder into 4-6 ounces of water and drink every morning- may take an additional dose (17 grams) once a day as needed for constipation   pravastatin (PRAVACHOL) 40 MG tablet TAKE ONE TABLET BY MOUTH ONCE DAILY   sodium chloride (MURO 128) 5 % ophthalmic ointment Place 1 Application into both eyes at  bedtime.   Turmeric 500 MG CAPS Take 500 mg by mouth in the morning.   vitamin B-12 (CYANOCOBALAMIN) 1000 MCG tablet Take 1 tablet (1,000 mcg total) by mouth daily.   No facility-administered encounter medications on file as of 12/14/2022.    Review of Systems:  Review of Systems  Health Maintenance  Topic Date Due   Medicare Annual Wellness (AWV)  12/23/2022 (Originally 11/13/2020)   COVID-19 Vaccine (6 - 2023-24 season) 02/15/2023 (Originally 06/22/2022)   INFLUENZA VACCINE  01/26/2023   MAMMOGRAM  07/12/2023   DTaP/Tdap/Td (5 - Td or Tdap) 10/24/2030   Pneumonia Vaccine 70+ Years old  Completed   DEXA SCAN  Completed   Zoster Vaccines- Shingrix  Completed   HPV VACCINES  Aged Out    Physical Exam: Vitals:  12/14/22 1051  BP: 105/72  Pulse: 75  Resp: 18  Temp: (!) 97 F (36.1 C)  SpO2: 98%  Weight: 139 lb 3.2 oz (63.1 kg)  Height: 5\' 1"  (1.549 m)   Body mass index is 26.3 kg/m. Physical Exam Vitals reviewed.  Constitutional:      General: She is not in acute distress. HENT:     Head: Normocephalic.     Nose: Nose normal.     Mouth/Throat:     Mouth: Mucous membranes are moist.  Eyes:     General:        Right eye: No discharge.        Left eye: No discharge.     Extraocular Movements: Extraocular movements intact.     Right eye: Normal extraocular motion and no nystagmus.     Left eye: Normal extraocular motion and no nystagmus.     Pupils: Pupils are equal, round, and reactive to light.  Cardiovascular:     Rate and Rhythm: Normal rate and regular rhythm.     Pulses: Normal pulses.     Heart sounds: Normal heart sounds.  Pulmonary:     Effort: Pulmonary effort is normal. No respiratory distress.     Breath sounds: Normal breath sounds. No wheezing.  Abdominal:     General: Bowel sounds are normal.     Palpations: Abdomen is soft.  Musculoskeletal:     Cervical back: Neck supple.     Right lower leg: No edema.     Left lower leg: No edema.  Skin:     General: Skin is warm.     Capillary Refill: Capillary refill takes less than 2 seconds.  Neurological:     General: No focal deficit present.     Mental Status: She is alert. Mental status is at baseline.     Motor: Weakness present.     Gait: Gait abnormal.     Comments: walker  Psychiatric:        Mood and Affect: Mood normal.     Labs reviewed: Basic Metabolic Panel: Recent Labs    07/07/22 0000 08/04/22 0000 12/04/22 1906 12/11/22 1937 12/12/22 0859 12/13/22 0411  NA  --    < > 141 140 139 138  K  --    < > 4.1 5.2* 3.8 3.4*  CL  --    < > 106 103 101 102  CO2  --    < > 26 28 25 24   GLUCOSE  --   --  96 109* 88 98  BUN  --    < > 28* 21 13 17   CREATININE  --    < > 1.06* 0.86 0.77 0.92  CALCIUM  --    < > 9.6 10.2 9.2 8.8*  MG  --   --  2.5*  --   --   --   TSH 1.92  --   --   --  3.099  --    < > = values in this interval not displayed.   Liver Function Tests: Recent Labs    07/04/22 1219 12/04/22 1906 12/11/22 1937  AST 31 28 22   ALT 53* 27 18  ALKPHOS 69 59 64  BILITOT 1.2 1.1 0.9  PROT 6.1* 6.2* 7.1  ALBUMIN 3.8 3.8 4.2   No results for input(s): "LIPASE", "AMYLASE" in the last 8760 hours. Recent Labs    12/11/22 2043  AMMONIA 12   CBC: Recent Labs    12/04/22  1906 12/11/22 1937 12/12/22 0859 12/13/22 0411  WBC 8.5 9.1 10.3 8.4  NEUTROABS 5.8 5.9  --   --   HGB 15.3* 17.6* 17.6* 16.5*  HCT 45.3 51.4* 50.6* 49.0*  MCV 92.1 91.8 90.4 90.1  PLT 191 217 181 208   Lipid Panel: Recent Labs    02/10/22 0000 04/21/22 0000 08/04/22 0000  CHOL 142 122 152  HDL 62 58 63  LDLCALC 64 50 70  TRIG 81 66 91   Lab Results  Component Value Date   HGBA1C 4.8 05/11/2021    Procedures since last visit: MR Brain W and Wo Contrast  Result Date: 12/12/2022 CLINICAL DATA:  Stroke, follow-up. Ataxia, confusion, history of meningioma in the posterior fossa. Evaluate for enlarged mass effect or posterior circulation infarct. EXAM: MRI HEAD  WITHOUT AND WITH CONTRAST TECHNIQUE: Multiplanar, multiecho pulse sequences of the brain and surrounding structures were obtained without and with intravenous contrast. CONTRAST:  6mL GADAVIST GADOBUTROL 1 MMOL/ML IV SOLN COMPARISON:  CT head 1 day prior, brain MRI 11/04/2021 FINDINGS: Brain: There is no acute intracranial hemorrhage, extra-axial fluid collection, or acute infarct. Parenchymal volume loss with prominence of the ventricular system and extra-axial CSF spaces is unchanged. Patchy and confluent FLAIR signal abnormality in the supratentorial white matter likely reflects sequela of underlying moderate chronic small-vessel ischemic change, similar to 2023. The 3.1 cm x 2.2 cm in the axial plane and up to 3.0 cm CC (11-12, 12-12) Homogeneously enhancing extra-axial lesion centered along the right aspect of the foramen magnum extending through the hypoglossal canal, along the dorsal aspect of the clivus, and dorsal aspect of the dens/C2 vertebral body appears slightly increased in size since the MRI from 11/04/2021. On the most recent contrast enhanced brain MRI from 2021, the lesion measured up to 2.7 cm x 2.0 cm in the axial plane. There remains significant mass effect on the underlying medulla which is compressed to the left but without parenchymal edema. There is partial effacement of fourth ventricle without upstream hydrocephalus. There is no other mass lesion or abnormal enhancement. There is no midline shift. Vascular: The major flow voids are preserved. There is mass effect on the right V4 segment by the above-described extra-axial mass, unchanged. Skull and upper cervical spine: Normal marrow signal. Sinuses/Orbits: The paranasal sinuses are clear. Bilateral lens implants are in place. The globes and orbits are otherwise unremarkable. Other: The mastoid air cells and middle ear cavities are clear. IMPRESSION: 1. The meningioma centered along the right aspect of the foramina magnum appears to be  slowly increasing in size over time when compared to the brain MRI from 2021. There remains significant mass effect on the underlying medulla which is compressed to the left but without parenchymal edema. 2. No acute intracranial pathology. Electronically Signed   By: Lesia Hausen M.D.   On: 12/12/2022 10:50   DG CHEST PORT 1 VIEW  Result Date: 12/11/2022 CLINICAL DATA:  Encephalopathy EXAM: PORTABLE CHEST 1 VIEW COMPARISON:  Chest x-ray 10/05/2019 FINDINGS: Heart is mildly enlarged. The lungs and costophrenic angles are clear. There is no pneumothorax or acute fracture. Left chest wall surgical clips are present. IMPRESSION: 1. No active disease. 2. Mild cardiomegaly. Electronically Signed   By: Darliss Cheney M.D.   On: 12/11/2022 23:21   CT Head Wo Contrast  Result Date: 12/11/2022 CLINICAL DATA:  Mental status changes, unknown cause. Confusion and headache. Known meningioma. EXAM: CT HEAD WITHOUT CONTRAST TECHNIQUE: Contiguous axial images were obtained from the base  of the skull through the vertex without intravenous contrast. RADIATION DOSE REDUCTION: This exam was performed according to the departmental dose-optimization program which includes automated exposure control, adjustment of the mA and/or kV according to patient size and/or use of iterative reconstruction technique. COMPARISON:  CT scan head and CTA head and neck 12/04/2022 and 12/05/2022, older studies back to November 2022. FINDINGS: Brain: Again noted is a 3.1 x 3.7 x 2.9 cm gray matter isodense extra-axial posterior fossa mass centered in between the dorsal clivus and the right anterolateral aspect of the brainstem and extending into the foramina magnum. Today's measurements were obtained on axial image 7 and sagittal reconstruction image 43. The mass size is stable back to November 2022, with effacement of the fourth ventricle and right perimedullary cistern, and compression of the brainstem from the right and anteriorly. There are  occasional punctate calcifications in the mass, which is probably a meningioma. There is mild-to-moderate atrophy and small-vessel disease. Moderate ventriculomegaly could be due to atrophic ventriculomegaly, chronic partial obstructive hydrocephalus or combination. The ventricles are stable in size and with no midline shift. No acute cortical based infarct, parenchymal mass, parenchymal or extra-axial hemorrhage are seen. Vascular: There are patchy calcifications in the carotid siphons and scattered calcific plaque in the distal vertebral arteries. No hyperdense central vessels are seen. Skull: Negative for fractures or focal lesions. Sinuses/Orbits: Negative orbits apart from old lens replacements. Clear sinuses and mastoids. Other: None. IMPRESSION: 1. No acute intracranial CT findings or interval changes. 2. Stable 3.1 x 3.7 x 2.9 cm posterior fossa mass, most likely a meningioma, with associated stable mass effect. 3. Atrophy and small-vessel disease. 4. Moderate ventriculomegaly which could be due to atrophic ventriculomegaly, chronic partial obstructive hydrocephalus or combination. Stable ventricular size. Electronically Signed   By: Almira Bar M.D.   On: 12/11/2022 20:17    Assessment/Plan 1. Dizziness - ongoing - today's orthostatics normal  2. Paresthesias - involves LLE - no recent falls - ambulates unit at [facility] well with walker - hospitalized 06/16-06/18> [facility] PT/OT recommended   3. Meningioma (HCC) - followed by neurology -CT head revealed 3.1 cm x 3.7 cm brain mass> was 2.2 cm x 3.0 cm - neurosurgery did not recommend surgical intervention  4. Paroxysmal atrial fibrillation (HCC) - HR< 100 with metoprolol - cont Eliquis for clot prevention  5. Essential hypertension - controlled  with metoprolol  6. Gait abnormality - cont PT/OT    Labs/tests ordered:  orthostatic blood pressure x 1  Next appt:  12/20/2022

## 2022-12-15 LAB — CULTURE, BLOOD (ROUTINE X 2)

## 2022-12-16 LAB — CULTURE, BLOOD (ROUTINE X 2)

## 2022-12-17 LAB — CULTURE, BLOOD (ROUTINE X 2)
Culture: NO GROWTH
Culture: NO GROWTH

## 2022-12-20 ENCOUNTER — Encounter: Payer: Self-pay | Admitting: Internal Medicine

## 2022-12-20 ENCOUNTER — Ambulatory Visit: Payer: Medicare PPO | Admitting: Internal Medicine

## 2022-12-20 VITALS — BP 130/82 | HR 76 | Temp 97.6°F | Resp 17 | Ht 61.0 in | Wt 139.2 lb

## 2022-12-20 DIAGNOSIS — I480 Paroxysmal atrial fibrillation: Secondary | ICD-10-CM | POA: Diagnosis not present

## 2022-12-20 DIAGNOSIS — Z7189 Other specified counseling: Secondary | ICD-10-CM | POA: Diagnosis not present

## 2022-12-20 DIAGNOSIS — D329 Benign neoplasm of meninges, unspecified: Secondary | ICD-10-CM

## 2022-12-20 DIAGNOSIS — F32A Depression, unspecified: Secondary | ICD-10-CM | POA: Diagnosis not present

## 2022-12-20 DIAGNOSIS — R42 Dizziness and giddiness: Secondary | ICD-10-CM | POA: Diagnosis not present

## 2022-12-20 DIAGNOSIS — I10 Essential (primary) hypertension: Secondary | ICD-10-CM

## 2022-12-20 DIAGNOSIS — R269 Unspecified abnormalities of gait and mobility: Secondary | ICD-10-CM | POA: Diagnosis not present

## 2022-12-20 NOTE — Progress Notes (Signed)
Location:  Clinic    Place of Service:   [facility]  Provider:   Code Status: DNR Goals of Care:     12/20/2022   10:45 AM  Advanced Directives  Does Patient Have a Medical Advance Directive? Yes  Type of Estate agent of [place];Living will;Out of facility DNR (pink MOST or yellow form)  Does patient want to make changes to medical advance directive? No - Patient declined  Copy of Healthcare Power of Attorney in Chart? Yes - validated most recent copy scanned in chart (See row information)     Chief Complaint  Patient presents with   Transitions Of Care    Patient is here for [facility] patient have some concerns    HPI: Patient is a 89 y.o. female seen today for an acute visit for Follow up from the hospital Patient lives in [place] in [facility].  Her daughter came with her today to discuss goals of care and further approach for her dizziness.  Patient has had 1 ED visit and 1 admission in the past 2 weeks. Patient went to ED the first time because she said that her legs would not work and she would not be was not able to walk.  She was not admitted and was came back to [place]. [name]  She was sent to ED again and was admitted from 6/16 to 6/18 for aphasia and dizziness and change in mental status.  Patient had an MRI done and it showed meningioma again which has increased in size causing compression on medulla She was seen by neurosurgery and again was told that she is not a surgical candidate .  Per Dr. Moises Blood note there is not much neurology can do either.  She did not have any acute stroke.  She already is on Eliquis.  Patient did not have any acute complaints.  She continues to have issues with cognition, aphasia, word finding, dizziness She is staying independent in [place] Patient's daughter thinks anxiety has something to do with her symptoms.  Patient did agree that she is depressed.  Past Medical History:  Diagnosis Date   Allergy    Ankle fracture, left     Atrial fibrillation (HCC)    Per [facility] New Patient Packet    Breast cancer (HCC) 1115/16   left    Bursitis of right shoulder    Cataract    Family history of cancer    Fracture of right wrist    GERD (gastroesophageal reflux disease)    Hyperlipidemia    Hypertension    Lumbar radiculitis    Per [facility] New Patient Packet    Meningioma [facility])    Per [facility] New Patient Packet    Skin cancer 2000   melanoma and basal cell   Stress fracture    Right Heel, Per [facility] New Patient Packet    TIA (transient ischemic attack) 2022   Vaso vagal episode    during preparation for colonoscopy    Past Surgical History:  Procedure Laterality Date   ABDOMINAL HYSTERECTOMY  1988   TAH/BSO--FIBROIDS   APPENDECTOMY     BREAST LUMPECTOMY     B/L--[facility]   BREAST LUMPECTOMY WITH RADIOACTIVE SEED AND SENTINEL LYMPH NODE BIOPSY Left 06/11/2015   Procedure: LEFT BREAST LUMPECTOMY WITH RADIOACTIVE SEED AND LEFT SENTINEL LYMPH NODE MAPPING;  Surgeon: Harriette Bouillon, MD;  Location: [facility] OR;  Service: General;  Laterality: Left;   CARDIOVERSION N/A 01/11/2022   Procedure: CARDIOVERSION;  Surgeon: Jodelle Red, MD;  Location: [facility] ENDOSCOPY;  Service: Cardiovascular;  Laterality: N/A;   CATARACT EXTRACTION  2015   COLONOSCOPY  2010   DG  BONE DENSITY ([facility] HX)     EYE SURGERY Bilateral    cataracts   fiberadenoma Bilateral 1978, 1980   GANGLION CYST EXCISION     L  hand   Lipiflow procedure      Allergies  Allergen Reactions   Sulfa Antibiotics Other (See Comments)    As a child, became "rigid as a stick and not responsive"   Tape Other (See Comments)    Blisters, Please use "paper" tape only for short periods of time   Wound Dressing Adhesive Other (See Comments)    Blisters, Please use "paper" tape only for short periods of time, Blisters, Please use "paper" tape only for short periods of time   Glucosamine Forte [Nutritional Supplements] Rash   Latex Rash    Outpatient Encounter Medications as  of 12/20/2022  Medication Sig   acetaminophen (TYLENOL) 325 MG tablet Take 325 mg by mouth every 6 (six) hours as needed for moderate pain.   Biotin 5 MG CAPS Take 5 mg by mouth daily.    Calcium Carb-Cholecalciferol (CALCIUM 600+D3) 600-10 MG-MCG TABS Take 1 tablet by mouth in the morning.   Cholecalciferol (VITAMIN D3) 1000 UNITS CAPS Take 1,000 Units by mouth daily.   ELIQUIS 5 MG TABS tablet TAKE ONE TABLET BY MOUTH TWICE DAILY   loperamide (IMODIUM A-D) 2 MG tablet Take 2 mg by mouth 3 (three) times daily as needed for diarrhea or loose stools.   metoprolol succinate (TOPROL XL) 25 MG 24 hr tablet Take 25 mg every morning   Multiple Vitamins-Minerals (MULTIVITAMIN WITH MINERALS) tablet Take 2 tablets by mouth daily with breakfast.   NONFORMULARY OR COMPOUNDED ITEM Apply 1 application  topically See admin instructions. Nystatin + Zinc Cream 1:1 ratio- Apply to perirectal area once a day as needed for rashes   Omega-3 Fatty Acids (FISH OIL PO) Take 1 capsule by mouth daily after breakfast.   polyethylene glycol (MIRALAX / GLYCOLAX) 17 g packet Take 17 g by mouth in the morning. Mix 17 grams of powder into 4-6 ounces of water and drink every morning- may take an additional dose (17 grams) once a day as needed for constipation   pravastatin (PRAVACHOL) 40 MG tablet TAKE ONE TABLET BY MOUTH ONCE DAILY   sodium chloride (MURO 128) 5 % ophthalmic ointment Place 1 Application into both eyes at bedtime.   Turmeric 500 MG CAPS Take 500 mg by mouth in the morning.   vitamin B-12 (CYANOCOBALAMIN) 1000 MCG tablet Take 1 tablet (1,000 mcg total) by mouth daily.   cycloSPORINE (RESTASIS) 0.05 % ophthalmic emulsion Place 1 drop into both eyes See admin instructions. Instill 1 drop into each eye two times a day, after applying a hot compress for 3-5 minutes beforehand (Patient not taking: Reported on 12/20/2022)   No facility-administered encounter medications on file as of 12/20/2022.    Review of Systems:   Review of Systems  Constitutional:  Negative for activity change and appetite change.  HENT: Negative.    Respiratory:  Negative for cough and shortness of breath.   Cardiovascular:  Negative for leg swelling.  Gastrointestinal:  Negative for constipation.  Genitourinary: Negative.   Musculoskeletal:  Positive for gait problem. Negative for arthralgias and myalgias.  Skin: Negative.   Neurological:  Positive for dizziness. Negative for weakness.  Psychiatric/Behavioral:  Positive for confusion. Negative for dysphoric  mood and sleep disturbance.     Health Maintenance  Topic Date Due   Medicare Annual Wellness (AWV)  12/23/2022 (Originally 11/13/2020)   COVID-19 Vaccine (6 - 2023-24 season) 02/15/2023 (Originally 06/22/2022)   INFLUENZA VACCINE  01/26/2023   MAMMOGRAM  07/12/2023   DTaP/Tdap/Td (5 - Td or Tdap) 10/24/2030   Pneumonia Vaccine 68+ Years old  Completed   DEXA SCAN  Completed   Zoster Vaccines- Shingrix  Completed   HPV VACCINES  Aged Out    Physical Exam: Vitals:   12/20/22 1049  BP: 130/82  Pulse: 76  Resp: 17  Temp: 97.6 F (36.4 C)  TempSrc: Temporal  SpO2: 95%  Weight: 139 lb 3.2 oz (63.1 kg)  Height: 5\' 1"  (1.549 m)   Body mass index is 26.3 kg/m. Physical Exam Vitals reviewed.  Constitutional:      Appearance: Normal appearance.  HENT:     Head: Normocephalic.     Nose: Nose normal.     Mouth/Throat:     Mouth: Mucous membranes are moist.     Pharynx: Oropharynx is clear.  Eyes:     Pupils: Pupils are equal, round, and reactive to light.  Cardiovascular:     Rate and Rhythm: Normal rate and regular rhythm.     Pulses: Normal pulses.     Heart sounds: Normal heart sounds. No murmur heard. Pulmonary:     Effort: Pulmonary effort is normal.     Breath sounds: Normal breath sounds.  Abdominal:     General: Abdomen is flat. Bowel sounds are normal.     Palpations: Abdomen is soft.  Musculoskeletal:        General: No swelling.      Cervical back: Neck supple.  Skin:    General: Skin is warm.  Neurological:     General: No focal deficit present.     Mental Status: She is alert.  Psychiatric:        Mood and Affect: Mood normal.        Thought Content: Thought content normal.     Labs reviewed: Basic Metabolic Panel: Recent Labs    07/07/22 0000 08/04/22 0000 12/04/22 1906 12/11/22 1937 12/12/22 0859 12/13/22 0411  NA  --    < > 141 140 139 138  K  --    < > 4.1 5.2* 3.8 3.4*  CL  --    < > 106 103 101 102  CO2  --    < > 26 28 25 24   GLUCOSE  --   --  96 109* 88 98  BUN  --    < > 28* 21 13 17   CREATININE  --    < > 1.06* 0.86 0.77 0.92  CALCIUM  --    < > 9.6 10.2 9.2 8.8*  MG  --   --  2.5*  --   --   --   TSH 1.92  --   --   --  3.099  --    < > = values in this interval not displayed.   Liver Function Tests: Recent Labs    07/04/22 1219 12/04/22 1906 12/11/22 1937  AST 31 28 22   ALT 53* 27 18  ALKPHOS 69 59 64  BILITOT 1.2 1.1 0.9  PROT 6.1* 6.2* 7.1  ALBUMIN 3.8 3.8 4.2   No results for input(s): "LIPASE", "AMYLASE" in the last 8760 hours. Recent Labs    12/11/22 2043  AMMONIA 12   CBC: Recent  Labs    12/04/22 1906 12/11/22 1937 12/12/22 0859 12/13/22 0411  WBC 8.5 9.1 10.3 8.4  NEUTROABS 5.8 5.9  --   --   HGB 15.3* 17.6* 17.6* 16.5*  HCT 45.3 51.4* 50.6* 49.0*  MCV 92.1 91.8 90.4 90.1  PLT 191 217 181 208   Lipid Panel: Recent Labs    02/10/22 0000 04/21/22 0000 08/04/22 0000  CHOL 142 122 152  HDL 62 58 63  LDLCALC 64 50 70  TRIG 81 66 91   Lab Results  Component Value Date   HGBA1C 4.8 05/11/2021    Procedures since last visit: MR Brain W and Wo Contrast  Result Date: 12/12/2022 CLINICAL DATA:  Stroke, follow-up. Ataxia, confusion, history of meningioma in the posterior fossa. Evaluate for enlarged mass effect or posterior circulation infarct. EXAM: MRI HEAD WITHOUT AND WITH CONTRAST TECHNIQUE: Multiplanar, multiecho pulse sequences of the brain and  surrounding structures were obtained without and with intravenous contrast. CONTRAST:  6mL GADAVIST GADOBUTROL 1 MMOL/ML IV SOLN COMPARISON:  CT head 1 day prior, brain MRI 11/04/2021 FINDINGS: Brain: There is no acute intracranial hemorrhage, extra-axial fluid collection, or acute infarct. Parenchymal volume loss with prominence of the ventricular system and extra-axial CSF spaces is unchanged. Patchy and confluent FLAIR signal abnormality in the supratentorial white matter likely reflects sequela of underlying moderate chronic small-vessel ischemic change, similar to 2023. The 3.1 cm x 2.2 cm in the axial plane and up to 3.0 cm CC (11-12, 12-12) Homogeneously enhancing extra-axial lesion centered along the right aspect of the foramen magnum extending through the hypoglossal canal, along the dorsal aspect of the clivus, and dorsal aspect of the dens/C2 vertebral body appears slightly increased in size since the MRI from 11/04/2021. On the most recent contrast enhanced brain MRI from 2021, the lesion measured up to 2.7 cm x 2.0 cm in the axial plane. There remains significant mass effect on the underlying medulla which is compressed to the left but without parenchymal edema. There is partial effacement of fourth ventricle without upstream hydrocephalus. There is no other mass lesion or abnormal enhancement. There is no midline shift. Vascular: The major flow voids are preserved. There is mass effect on the right V4 segment by the above-described extra-axial mass, unchanged. Skull and upper cervical spine: Normal marrow signal. Sinuses/Orbits: The paranasal sinuses are clear. Bilateral lens implants are in place. The globes and orbits are otherwise unremarkable. Other: The mastoid air cells and middle ear cavities are clear. IMPRESSION: 1. The meningioma centered along the right aspect of the foramina magnum appears to be slowly increasing in size over time when compared to the brain MRI from 2021. There remains  significant mass effect on the underlying medulla which is compressed to the left but without parenchymal edema. 2. No acute intracranial pathology. Electronically Signed   By: Lesia Hausen M.D.   On: 12/12/2022 10:50   DG CHEST PORT 1 VIEW  Result Date: 12/11/2022 CLINICAL DATA:  Encephalopathy EXAM: PORTABLE CHEST 1 VIEW COMPARISON:  Chest x-ray 10/05/2019 FINDINGS: Heart is mildly enlarged. The lungs and costophrenic angles are clear. There is no pneumothorax or acute fracture. Left chest wall surgical clips are present. IMPRESSION: 1. No active disease. 2. Mild cardiomegaly. Electronically Signed   By: Darliss Cheney M.D.   On: 12/11/2022 23:21   CT Head Wo Contrast  Result Date: 12/11/2022 CLINICAL DATA:  Mental status changes, unknown cause. Confusion and headache. Known meningioma. EXAM: CT HEAD WITHOUT CONTRAST TECHNIQUE: Contiguous axial images  were obtained from the base of the skull through the vertex without intravenous contrast. RADIATION DOSE REDUCTION: This exam was performed according to the departmental dose-optimization program which includes automated exposure control, adjustment of the mA and/or kV according to patient size and/or use of iterative reconstruction technique. COMPARISON:  CT scan head and CTA head and neck 12/04/2022 and 12/05/2022, older studies back to November 2022. FINDINGS: Brain: Again noted is a 3.1 x 3.7 x 2.9 cm gray matter isodense extra-axial posterior fossa mass centered in between the dorsal clivus and the right anterolateral aspect of the brainstem and extending into the foramina magnum. Today's measurements were obtained on axial image 7 and sagittal reconstruction image 43. The mass size is stable back to November 2022, with effacement of the fourth ventricle and right perimedullary cistern, and compression of the brainstem from the right and anteriorly. There are occasional punctate calcifications in the mass, which is probably a meningioma. There is  mild-to-moderate atrophy and small-vessel disease. Moderate ventriculomegaly could be due to atrophic ventriculomegaly, chronic partial obstructive hydrocephalus or combination. The ventricles are stable in size and with no midline shift. No acute cortical based infarct, parenchymal mass, parenchymal or extra-axial hemorrhage are seen. Vascular: There are patchy calcifications in the carotid siphons and scattered calcific plaque in the distal vertebral arteries. No hyperdense central vessels are seen. Skull: Negative for fractures or focal lesions. Sinuses/Orbits: Negative orbits apart from old lens replacements. Clear sinuses and mastoids. Other: None. IMPRESSION: 1. No acute intracranial CT findings or interval changes. 2. Stable 3.1 x 3.7 x 2.9 cm posterior fossa mass, most likely a meningioma, with associated stable mass effect. 3. Atrophy and small-vessel disease. 4. Moderate ventriculomegaly which could be due to atrophic ventriculomegaly, chronic partial obstructive hydrocephalus or combination. Stable ventricular size. Electronically Signed   By: Almira Bar M.D.   On: 12/11/2022 20:17   CT ANGIO HEAD NECK W WO CM  Result Date: 12/05/2022 CLINICAL DATA:  Initial evaluation for neuro deficit, stroke. EXAM: CT ANGIOGRAPHY HEAD AND NECK WITH AND WITHOUT CONTRAST TECHNIQUE: Multidetector CT imaging of the head and neck was performed using the standard protocol during bolus administration of intravenous contrast. Multiplanar CT image reconstructions and MIPs were obtained to evaluate the vascular anatomy. Carotid stenosis measurements (when applicable) are obtained utilizing NASCET criteria, using the distal internal carotid diameter as the denominator. RADIATION DOSE REDUCTION: This exam was performed according to the departmental dose-optimization program which includes automated exposure control, adjustment of the mA and/or kV according to patient size and/or use of iterative reconstruction technique.  CONTRAST:  75mL OMNIPAQUE IOHEXOL 350 MG/ML SOLN COMPARISON:  CT from 12/04/2022. FINDINGS: CTA NECK FINDINGS Aortic arch: Visualized aortic arch normal in caliber with standard 3 vessel morphology. Atheromatous change about the arch itself. No significant stenosis about the origin the great vessels. Right carotid system: Right common and internal carotid arteries are patent without dissection. Mild atheromatous change about the right carotid bulb without hemodynamically significant greater than 50% stenosis. Left carotid system: Left common and internal carotid arteries are patent without evidence for dissection. Minor atheromatous change about the left carotid bulb without hemodynamically significant greater than 50% stenosis. Vertebral arteries: Both vertebral arteries arise from the subclavian arteries. No proximal subclavian artery stenosis. Left vertebral artery slightly dominant. Vertebral arteries are patent without stenosis or dissection. Skeleton: No discrete or worrisome osseous lesions. Moderately advanced cervical spondylosis at C3-4 through C6-7. Other neck: No other acute abnormality within the neck. Upper chest: Scattered interlobular septal  thickening within the visualized lungs, consistent with pulmonary interstitial edema. Associated small layering bilateral pleural effusions. Few small nodules are seen clustered together at the peripheral right upper lobe, largest of which measures 5 mm (series 6, image 11), indeterminate. Review of the MIP images confirms the above findings CTA HEAD FINDINGS Anterior circulation: Mild atheromatous change about the carotid siphons without hemodynamically significant stenosis. A1 segments, anterior tibial artery complex common anterior cerebral arteries patent without stenosis. No M1 stenosis or occlusion. No proximal MCA branch occlusion or high-grade stenosis. Distal MCA branches perfused and symmetric. Posterior circulation: Both V4 segments patent without  stenosis. Intradural right V4 segment is displaced posteriorly due to the extra-axial mass at the right skull base. Both PICA patent. Basilar patent without stenosis. Superior cerebellar and posterior cerebral arteries patent bilaterally. Venous sinuses: Grossly patent allowing for timing the contrast bolus. Anatomic variants: None significant.  No aneurysm. Review of the MIP images confirms the above findings IMPRESSION: 1. Negative CTA for large vessel occlusion or other emergent finding. 2. Mild atheromatous change about the carotid bifurcations and carotid siphons without hemodynamically significant or correctable stenosis. 3. Mild pulmonary interstitial edema with small layering bilateral pleural effusions. 4. Few small nodules clustered together at the peripheral right upper lobe, largest of which measures 5 mm, indeterminate. Per Fleischner Society Guidelines, if patient is low risk for malignancy, no routine follow-up imaging is recommended. If patient is high risk for malignancy, a non-contrast Chest CT at 12 months is optional. If performed and the nodule is stable at 12 months, no further follow-up is recommended. These guidelines do not apply to immunocompromised patients and patients with cancer. Follow up in patients with significant comorbidities as clinically warranted. For lung cancer screening, adhere to Lung-RADS guidelines. Reference: Radiology. 2017; 284(1):228-43. Electronically Signed   By: Rise Mu M.D.   On: 12/05/2022 01:29   CT Head Wo Contrast  Result Date: 12/04/2022 CLINICAL DATA:  Stroke, acute neurological deficit. EXAM: CT HEAD WITHOUT CONTRAST TECHNIQUE: Contiguous axial images were obtained from the base of the skull through the vertex without intravenous contrast. RADIATION DOSE REDUCTION: This exam was performed according to the departmental dose-optimization program which includes automated exposure control, adjustment of the mA and/or kV according to patient size  and/or use of iterative reconstruction technique. COMPARISON:  07/04/2022 FINDINGS: Brain: Solid extra-axial mass within the right ventrolateral, inferior posterior fossa closely apposed to the dorsal clivus and extending into the foramen magnum demonstrates significant mass effect upon the brainstem, similar to prior examination. This mass appears stable in size measuring roughly 2.2 x 3.0 cm at axial image # 29/2. Mass effect results in effacement of the fourth ventricle, similar to prior examination, with stable mild ventriculomegaly involving the lateral and third ventricles which may reflect changes of secondary obstructing hydrocephalus. Mild periventricular white matter changes are again noted, likely reflecting the sequela of small vessel ischemia. No acute infarct or acute intracranial hemorrhage. No abnormal intra or extra-axial fluid collection. Vascular: No hyperdense vessel or unexpected calcification. Skull: Normal. Negative for fracture or focal lesion. Sinuses/Orbits: No acute finding. Other: Mastoid air cells and middle ear cavities are clear. IMPRESSION: 1. No acute intracranial hemorrhage or infarct. 2. Stable extra-axial mass within the right ventrolateral, inferior posterior fossa closely apposed to the dorsal clivus and extending into the foramen magnum with significant mass effect upon the brainstem, similar to prior examination. Mass effect results in effacement of the fourth ventricle, with stable mild ventriculomegaly involving the lateral and third ventricles  which may reflect changes of secondary obstructing hydrocephalus. 3. Mild senescent changes. Electronically Signed   By: Helyn Numbers M.D.   On: 12/04/2022 21:18    Assessment/Plan 1. Dizziness and goals of care Dizziness continues to be her issue This due to her meningioma Discussed with the daughter today She is agreeable that her mom does not need to go to the hospital unless there is anything new. I have signed the MOST  form with limited interventions.  Also the nurses will call the daughter before sending patient to ED  2. Meningioma [facility]) Not a surgical candidate  3. Paroxysmal atrial fibrillation (HCC) On Eliquis and metoprolol  4. Depression, unspecified depression type Patient started on low-dose of Lexapro Check BMP after 2 weeks She will also get some counseling from [facility]  5. Essential hypertension Metoprolol  6. Gait abnormality Use walker all the time    Labs/tests ordered:   Next appt:  12/20/2022

## 2023-01-03 ENCOUNTER — Encounter: Payer: Self-pay | Admitting: Internal Medicine

## 2023-01-03 ENCOUNTER — Non-Acute Institutional Stay: Payer: Medicare PPO | Admitting: Internal Medicine

## 2023-01-03 VITALS — BP 130/80 | HR 99 | Temp 97.6°F | Resp 17 | Ht 61.0 in | Wt 140.8 lb

## 2023-01-03 DIAGNOSIS — R4701 Aphasia: Secondary | ICD-10-CM

## 2023-01-03 DIAGNOSIS — I480 Paroxysmal atrial fibrillation: Secondary | ICD-10-CM | POA: Diagnosis not present

## 2023-01-03 DIAGNOSIS — D329 Benign neoplasm of meninges, unspecified: Secondary | ICD-10-CM

## 2023-01-03 DIAGNOSIS — F32A Depression, unspecified: Secondary | ICD-10-CM

## 2023-01-03 DIAGNOSIS — R42 Dizziness and giddiness: Secondary | ICD-10-CM

## 2023-01-03 NOTE — Progress Notes (Signed)
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 Yes  Type of Estate agent of [place];Living will;Out of facility DNR (pink MOST or yellow form)  Does patient want to make changes to medical advance directive? No - Patient declined  Copy of Healthcare Power of Attorney in Chart? Yes - validated most recent copy scanned in chart (See row information)     Chief Complaint  Patient presents with   Acute Visit    Patient states she is here [place] confusion    HPI: Patient is a 89 y.o. female seen today for an acute visit for Confusion and Aphasia and Word Finding Patient [place] in [place].   Patient has known h/o Meningioma. Has been to ED many times with number of MRI . MRI it showed meningioma again which has increased in size causing compression on medulla She was seen by neurosurgery and again was told that she is not a surgical candidate Per Dr. Moises Blood note there is not much neurology can do either.  She did not have any acute stroke.  She already is on Eliquis.   Yesterday she had another episode of difficulty in word finding. And she wanted to  be seen Today she is back to her baseline Walking with her walker. No Neurological deficits She continues to have cognition issues and forgets that she has Meningioma  Also c/o Pain in her Left shoulder today She was started on Lexapro last time as we think her anxiety makes her symptoms worse  Other issues  H/o Low Back Pain with Radiculopathy follows with Neurology H/o Breast Cancer Invasive Ductal Carcinoma diagnosed in 2016 Osteopenia Last DEXA was -1.6 in 2020 H/o PAF  Follows with Cardiology On Eliquis Also h/o Bradycardia Hypertension with Tendency for Orthostatic Hypotension   Past Medical History:  Diagnosis Date    Allergy    Ankle fracture, left    Atrial fibrillation (HCC)    Per [facility] New Patient Packet    Breast cancer (HCC) 1115/16   left    Bursitis of right shoulder    Cataract    Family history of cancer    Fracture of right wrist    GERD (gastroesophageal reflux disease)    Hyperlipidemia    Hypertension    Lumbar radiculitis    Per [facility] New Patient Packet    Meningioma [facility])    Per [facility] New Patient Packet    Skin cancer 2000   melanoma and basal cell   Stress fracture    Right Heel, Per [facility] New Patient Packet    TIA (transient ischemic attack) 2022   Vaso vagal episode    during preparation for colonoscopy    Past Surgical History:  Procedure Laterality Date   ABDOMINAL HYSTERECTOMY  1988   TAH/BSO--FIBROIDS   APPENDECTOMY     BREAST LUMPECTOMY     B/L--[facility]   BREAST LUMPECTOMY WITH RADIOACTIVE SEED AND SENTINEL LYMPH NODE BIOPSY Left 06/11/2015   Procedure: LEFT BREAST LUMPECTOMY WITH RADIOACTIVE SEED AND LEFT SENTINEL LYMPH NODE MAPPING;  Surgeon: Harriette Bouillon, MD;  Location: [facility] OR;  Service: General;  Laterality: Left;   CARDIOVERSION N/A 01/11/2022   Procedure: CARDIOVERSION;  Surgeon: Jodelle Red, MD;  Location: [facility] ENDOSCOPY;  Service: Cardiovascular;  Laterality: N/A;   CATARACT  EXTRACTION  2015   COLONOSCOPY  2010   DG  BONE DENSITY ([facility] HX)     EYE SURGERY Bilateral    cataracts   fiberadenoma Bilateral 1978, 1980   GANGLION CYST EXCISION     L  hand   Lipiflow procedure      Allergies  Allergen Reactions   Sulfa Antibiotics Other (See Comments)    As a child, became "rigid as a stick and not responsive"   Tape Other (See Comments)    Blisters, Please use "paper" tape only for short periods of time   Wound Dressing Adhesive Other (See Comments)    Blisters, Please use "paper" tape only for short periods of time, Blisters, Please use "paper" tape only for short periods of time   Glucosamine Forte [Nutritional Supplements] Rash   Latex Rash     Outpatient Encounter Medications as of 01/03/2023  Medication Sig   acetaminophen (TYLENOL) 325 MG tablet Take 325 mg by mouth every 6 (six) hours as needed for moderate pain.   Biotin 5 MG CAPS Take 5 mg by mouth daily.    Calcium Carb-Cholecalciferol (CALCIUM 600+D3) 600-10 MG-MCG TABS Take 1 tablet by mouth in the morning.   Cholecalciferol (VITAMIN D3) 1000 UNITS CAPS Take 1,000 Units by mouth daily.   cycloSPORINE (RESTASIS) 0.05 % ophthalmic emulsion Place 1 drop into both eyes See admin instructions. Instill 1 drop into each eye two times a day, after applying a hot compress for 3-5 minutes beforehand (Patient not taking: Reported on 12/20/2022)   ELIQUIS 5 MG TABS tablet TAKE ONE TABLET BY MOUTH TWICE DAILY   escitalopram (LEXAPRO) 5 MG tablet Take 5 mg by mouth daily.   loperamide (IMODIUM A-D) 2 MG tablet Take 2 mg by mouth 3 (three) times daily as needed for diarrhea or loose stools.   metoprolol succinate (TOPROL XL) 25 MG 24 hr tablet Take 25 mg every morning   Multiple Vitamins-Minerals (MULTIVITAMIN WITH MINERALS) tablet Take 2 tablets by mouth daily with breakfast.   NONFORMULARY OR COMPOUNDED ITEM Apply 1 application  topically See admin instructions. Nystatin + Zinc Cream 1:1 ratio- Apply to perirectal area once a day as needed for rashes   Omega-3 Fatty Acids (FISH OIL PO) Take 1 capsule by mouth daily after breakfast.   polyethylene glycol (MIRALAX / GLYCOLAX) 17 g packet Take 17 g by mouth in the morning. Mix 17 grams of powder into 4-6 ounces of water and drink every morning- may take an additional dose (17 grams) once a day as needed for constipation   pravastatin (PRAVACHOL) 40 MG tablet TAKE ONE TABLET BY MOUTH ONCE DAILY   sodium chloride (MURO 128) 5 % ophthalmic ointment Place 1 Application into both eyes at bedtime.   Turmeric 500 MG CAPS Take 500 mg by mouth in the morning.   vitamin B-12 (CYANOCOBALAMIN) 1000 MCG tablet Take 1 tablet (1,000 mcg total) by mouth  daily.   No facility-administered encounter medications on file as of 01/03/2023.    Review of Systems:  Review of Systems  Unable to perform ROS: Other    Health Maintenance  Topic Date Due   Medicare Annual Wellness (AWV)  11/13/2020   COVID-19 Vaccine (6 - 2023-24 season) 02/15/2023 (Originally 06/22/2022)   INFLUENZA VACCINE  01/26/2023   MAMMOGRAM  07/12/2023   DTaP/Tdap/Td (5 - Td or Tdap) 10/24/2030   Pneumonia Vaccine 62+ Years old  Completed   DEXA SCAN  Completed   Zoster Vaccines- Shingrix  Completed  HPV VACCINES  Aged Out    Physical Exam: Vitals:   01/03/23 0818  BP: 130/80  Pulse: 99  Resp: 17  Temp: 97.6 F (36.4 C)  TempSrc: Temporal  SpO2: 98%  Weight: 140 lb 12.8 oz (63.9 kg)  Height: 5\' 1"  (1.549 m)   Body mass index is 26.6 kg/m. Physical Exam Vitals reviewed.  Constitutional:      Appearance: Normal appearance.  HENT:     Head: Normocephalic.     Nose: Nose normal.     Mouth/Throat:     Mouth: Mucous membranes are moist.     Pharynx: Oropharynx is clear.  Eyes:     Pupils: Pupils are equal, round, and reactive to light.  Cardiovascular:     Rate and Rhythm: Normal rate. Rhythm irregular.     Pulses: Normal pulses.     Heart sounds: Normal heart sounds. No murmur heard. Pulmonary:     Effort: Pulmonary effort is normal.     Breath sounds: Normal breath sounds.  Abdominal:     General: Abdomen is flat. Bowel sounds are normal.     Palpations: Abdomen is soft.  Musculoskeletal:        General: No swelling.     Cervical back: Neck supple.     Comments: Some c/o Pain in her Left shoulder but had good Passive movement  Skin:    General: Skin is warm.  Neurological:     General: No focal deficit present.     Mental Status: She is alert.  Psychiatric:        Mood and Affect: Mood normal.        Thought Content: Thought content normal.     Labs reviewed: Basic Metabolic Panel: Recent Labs    07/07/22 0000 08/04/22 0000  12/04/22 1906 12/11/22 1937 12/12/22 0859 12/13/22 0411  NA  --    < > 141 140 139 138  K  --    < > 4.1 5.2* 3.8 3.4*  CL  --    < > 106 103 101 102  CO2  --    < > 26 28 25 24   GLUCOSE  --   --  96 109* 88 98  BUN  --    < > 28* 21 13 17   CREATININE  --    < > 1.06* 0.86 0.77 0.92  CALCIUM  --    < > 9.6 10.2 9.2 8.8*  MG  --   --  2.5*  --   --   --   TSH 1.92  --   --   --  3.099  --    < > = values in this interval not displayed.   Liver Function Tests: Recent Labs    07/04/22 1219 12/04/22 1906 12/11/22 1937  AST 31 28 22   ALT 53* 27 18  ALKPHOS 69 59 64  BILITOT 1.2 1.1 0.9  PROT 6.1* 6.2* 7.1  ALBUMIN 3.8 3.8 4.2   No results for input(s): "LIPASE", "AMYLASE" in the last 8760 hours. Recent Labs    12/11/22 2043  AMMONIA 12   CBC: Recent Labs    12/04/22 1906 12/11/22 1937 12/12/22 0859 12/13/22 0411  WBC 8.5 9.1 10.3 8.4  NEUTROABS 5.8 5.9  --   --   HGB 15.3* 17.6* 17.6* 16.5*  HCT 45.3 51.4* 50.6* 49.0*  MCV 92.1 91.8 90.4 90.1  PLT 191 217 181 208   Lipid Panel: Recent Labs    02/10/22 0000  04/21/22 0000 08/04/22 0000  CHOL 142 122 152  HDL 62 58 63  LDLCALC 64 50 70  TRIG 81 66 91   Lab Results  Component Value Date   HGBA1C 4.8 05/11/2021    Procedures since last visit: MR Brain W and Wo Contrast  Result Date: 12/12/2022 CLINICAL DATA:  Stroke, follow-up. Ataxia, confusion, history of meningioma in the posterior fossa. Evaluate for enlarged mass effect or posterior circulation infarct. EXAM: MRI HEAD WITHOUT AND WITH CONTRAST TECHNIQUE: Multiplanar, multiecho pulse sequences of the brain and surrounding structures were obtained without and with intravenous contrast. CONTRAST:  6mL GADAVIST GADOBUTROL 1 MMOL/ML IV SOLN COMPARISON:  CT head 1 day prior, brain MRI 11/04/2021 FINDINGS: Brain: There is no acute intracranial hemorrhage, extra-axial fluid collection, or acute infarct. Parenchymal volume loss with prominence of the ventricular  system and extra-axial CSF spaces is unchanged. Patchy and confluent FLAIR signal abnormality in the supratentorial white matter likely reflects sequela of underlying moderate chronic small-vessel ischemic change, similar to 2023. The 3.1 cm x 2.2 cm in the axial plane and up to 3.0 cm CC (11-12, 12-12) Homogeneously enhancing extra-axial lesion centered along the right aspect of the foramen magnum extending through the hypoglossal canal, along the dorsal aspect of the clivus, and dorsal aspect of the dens/C2 vertebral body appears slightly increased in size since the MRI from 11/04/2021. On the most recent contrast enhanced brain MRI from 2021, the lesion measured up to 2.7 cm x 2.0 cm in the axial plane. There remains significant mass effect on the underlying medulla which is compressed to the left but without parenchymal edema. There is partial effacement of fourth ventricle without upstream hydrocephalus. There is no other mass lesion or abnormal enhancement. There is no midline shift. Vascular: The major flow voids are preserved. There is mass effect on the right V4 segment by the above-described extra-axial mass, unchanged. Skull and upper cervical spine: Normal marrow signal. Sinuses/Orbits: The paranasal sinuses are clear. Bilateral lens implants are in place. The globes and orbits are otherwise unremarkable. Other: The mastoid air cells and middle ear cavities are clear. IMPRESSION: 1. The meningioma centered along the right aspect of the foramina magnum appears to be slowly increasing in size over time when compared to the brain MRI from 2021. There remains significant mass effect on the underlying medulla which is compressed to the left but without parenchymal edema. 2. No acute intracranial pathology. Electronically Signed   By: Lesia Hausen M.D.   On: 12/12/2022 10:50   DG CHEST PORT 1 VIEW  Result Date: 12/11/2022 CLINICAL DATA:  Encephalopathy EXAM: PORTABLE CHEST 1 VIEW COMPARISON:  Chest x-ray  10/05/2019 FINDINGS: Heart is mildly enlarged. The lungs and costophrenic angles are clear. There is no pneumothorax or acute fracture. Left chest wall surgical clips are present. IMPRESSION: 1. No active disease. 2. Mild cardiomegaly. Electronically Signed   By: Darliss Cheney M.D.   On: 12/11/2022 23:21   CT Head Wo Contrast  Result Date: 12/11/2022 CLINICAL DATA:  Mental status changes, unknown cause. Confusion and headache. Known meningioma. EXAM: CT HEAD WITHOUT CONTRAST TECHNIQUE: Contiguous axial images were obtained from the base of the skull through the vertex without intravenous contrast. RADIATION DOSE REDUCTION: This exam was performed according to the departmental dose-optimization program which includes automated exposure control, adjustment of the mA and/or kV according to patient size and/or use of iterative reconstruction technique. COMPARISON:  CT scan head and CTA head and neck 12/04/2022 and 12/05/2022, older studies  back to November 2022. FINDINGS: Brain: Again noted is a 3.1 x 3.7 x 2.9 cm gray matter isodense extra-axial posterior fossa mass centered in between the dorsal clivus and the right anterolateral aspect of the brainstem and extending into the foramina magnum. Today's measurements were obtained on axial image 7 and sagittal reconstruction image 43. The mass size is stable back to November 2022, with effacement of the fourth ventricle and right perimedullary cistern, and compression of the brainstem from the right and anteriorly. There are occasional punctate calcifications in the mass, which is probably a meningioma. There is mild-to-moderate atrophy and small-vessel disease. Moderate ventriculomegaly could be due to atrophic ventriculomegaly, chronic partial obstructive hydrocephalus or combination. The ventricles are stable in size and with no midline shift. No acute cortical based infarct, parenchymal mass, parenchymal or extra-axial hemorrhage are seen. Vascular: There are  patchy calcifications in the carotid siphons and scattered calcific plaque in the distal vertebral arteries. No hyperdense central vessels are seen. Skull: Negative for fractures or focal lesions. Sinuses/Orbits: Negative orbits apart from old lens replacements. Clear sinuses and mastoids. Other: None. IMPRESSION: 1. No acute intracranial CT findings or interval changes. 2. Stable 3.1 x 3.7 x 2.9 cm posterior fossa mass, most likely a meningioma, with associated stable mass effect. 3. Atrophy and small-vessel disease. 4. Moderate ventriculomegaly which could be due to atrophic ventriculomegaly, chronic partial obstructive hydrocephalus or combination. Stable ventricular size. Electronically Signed   By: Almira Bar M.D.   On: 12/11/2022 20:17   CT ANGIO HEAD NECK W WO CM  Result Date: 12/05/2022 CLINICAL DATA:  Initial evaluation for neuro deficit, stroke. EXAM: CT ANGIOGRAPHY HEAD AND NECK WITH AND WITHOUT CONTRAST TECHNIQUE: Multidetector CT imaging of the head and neck was performed using the standard protocol during bolus administration of intravenous contrast. Multiplanar CT image reconstructions and MIPs were obtained to evaluate the vascular anatomy. Carotid stenosis measurements (when applicable) are obtained utilizing NASCET criteria, using the distal internal carotid diameter as the denominator. RADIATION DOSE REDUCTION: This exam was performed according to the departmental dose-optimization program which includes automated exposure control, adjustment of the mA and/or kV according to patient size and/or use of iterative reconstruction technique. CONTRAST:  75mL OMNIPAQUE IOHEXOL 350 MG/ML SOLN COMPARISON:  CT from 12/04/2022. FINDINGS: CTA NECK FINDINGS Aortic arch: Visualized aortic arch normal in caliber with standard 3 vessel morphology. Atheromatous change about the arch itself. No significant stenosis about the origin the great vessels. Right carotid system: Right common and internal carotid  arteries are patent without dissection. Mild atheromatous change about the right carotid bulb without hemodynamically significant greater than 50% stenosis. Left carotid system: Left common and internal carotid arteries are patent without evidence for dissection. Minor atheromatous change about the left carotid bulb without hemodynamically significant greater than 50% stenosis. Vertebral arteries: Both vertebral arteries arise from the subclavian arteries. No proximal subclavian artery stenosis. Left vertebral artery slightly dominant. Vertebral arteries are patent without stenosis or dissection. Skeleton: No discrete or worrisome osseous lesions. Moderately advanced cervical spondylosis at C3-4 through C6-7. Other neck: No other acute abnormality within the neck. Upper chest: Scattered interlobular septal thickening within the visualized lungs, consistent with pulmonary interstitial edema. Associated small layering bilateral pleural effusions. Few small nodules are seen clustered together at the peripheral right upper lobe, largest of which measures 5 mm (series 6, image 11), indeterminate. Review of the MIP images confirms the above findings CTA HEAD FINDINGS Anterior circulation: Mild atheromatous change about the carotid siphons without hemodynamically  significant stenosis. A1 segments, anterior tibial artery complex common anterior cerebral arteries patent without stenosis. No M1 stenosis or occlusion. No proximal MCA branch occlusion or high-grade stenosis. Distal MCA branches perfused and symmetric. Posterior circulation: Both V4 segments patent without stenosis. Intradural right V4 segment is displaced posteriorly due to the extra-axial mass at the right skull base. Both PICA patent. Basilar patent without stenosis. Superior cerebellar and posterior cerebral arteries patent bilaterally. Venous sinuses: Grossly patent allowing for timing the contrast bolus. Anatomic variants: None significant.  No aneurysm.  Review of the MIP images confirms the above findings IMPRESSION: 1. Negative CTA for large vessel occlusion or other emergent finding. 2. Mild atheromatous change about the carotid bifurcations and carotid siphons without hemodynamically significant or correctable stenosis. 3. Mild pulmonary interstitial edema with small layering bilateral pleural effusions. 4. Few small nodules clustered together at the peripheral right upper lobe, largest of which measures 5 mm, indeterminate. Per Fleischner Society Guidelines, if patient is low risk for malignancy, no routine follow-up imaging is recommended. If patient is high risk for malignancy, a non-contrast Chest CT at 12 months is optional. If performed and the nodule is stable at 12 months, no further follow-up is recommended. These guidelines do not apply to immunocompromised patients and patients with cancer. Follow up in patients with significant comorbidities as clinically warranted. For lung cancer screening, adhere to Lung-RADS guidelines. Reference: Radiology. 2017; 284(1):228-43. Electronically Signed   By: Rise Mu M.D.   On: 12/05/2022 01:29   CT Head Wo Contrast  Result Date: 12/04/2022 CLINICAL DATA:  Stroke, acute neurological deficit. EXAM: CT HEAD WITHOUT CONTRAST TECHNIQUE: Contiguous axial images were obtained from the base of the skull through the vertex without intravenous contrast. RADIATION DOSE REDUCTION: This exam was performed according to the departmental dose-optimization program which includes automated exposure control, adjustment of the mA and/or kV according to patient size and/or use of iterative reconstruction technique. COMPARISON:  07/04/2022 FINDINGS: Brain: Solid extra-axial mass within the right ventrolateral, inferior posterior fossa closely apposed to the dorsal clivus and extending into the foramen magnum demonstrates significant mass effect upon the brainstem, similar to prior examination. This mass appears stable in  size measuring roughly 2.2 x 3.0 cm at axial image # 29/2. Mass effect results in effacement of the fourth ventricle, similar to prior examination, with stable mild ventriculomegaly involving the lateral and third ventricles which may reflect changes of secondary obstructing hydrocephalus. Mild periventricular white matter changes are again noted, likely reflecting the sequela of small vessel ischemia. No acute infarct or acute intracranial hemorrhage. No abnormal intra or extra-axial fluid collection. Vascular: No hyperdense vessel or unexpected calcification. Skull: Normal. Negative for fracture or focal lesion. Sinuses/Orbits: No acute finding. Other: Mastoid air cells and middle ear cavities are clear. IMPRESSION: 1. No acute intracranial hemorrhage or infarct. 2. Stable extra-axial mass within the right ventrolateral, inferior posterior fossa closely apposed to the dorsal clivus and extending into the foramen magnum with significant mass effect upon the brainstem, similar to prior examination. Mass effect results in effacement of the fourth ventricle, with stable mild ventriculomegaly involving the lateral and third ventricles which may reflect changes of secondary obstructing hydrocephalus. 3. Mild senescent changes. Electronically Signed   By: Helyn Numbers M.D.   On: 12/04/2022 21:18    Assessment/Plan 1. Dizziness Due to her Meningioma Encourage PO fluids 2. Meningioma [facility]) Not candidate for surgery  3. Aphasia Related to her ? Cognition  MRI in 06/24 showed Atrophy and Chronic Vessel  Disease 4. Depression, unspecified depression type Change Lexapro to 10 mg  5. Paroxysmal atrial fibrillation (HCC) On Eliquis    Labs/tests ordered:  * No order type specified * Next appt:  02/01/2023

## 2023-01-04 DIAGNOSIS — I10 Essential (primary) hypertension: Secondary | ICD-10-CM | POA: Diagnosis not present

## 2023-01-04 LAB — COMPREHENSIVE METABOLIC PANEL: Calcium: 9.5 (ref 8.7–10.7)

## 2023-01-04 LAB — BASIC METABOLIC PANEL
BUN: 16 (ref 4–21)
CO2: 25 — AB (ref 13–22)
Chloride: 104 (ref 99–108)
Creatinine: 0.8 (ref 0.5–1.1)
Glucose: 99
Potassium: 4.1 mEq/L (ref 3.5–5.1)
Sodium: 141 (ref 137–147)

## 2023-01-11 DIAGNOSIS — L853 Xerosis cutis: Secondary | ICD-10-CM | POA: Diagnosis not present

## 2023-01-11 DIAGNOSIS — L570 Actinic keratosis: Secondary | ICD-10-CM | POA: Diagnosis not present

## 2023-01-24 ENCOUNTER — Encounter: Payer: Self-pay | Admitting: Orthopedic Surgery

## 2023-01-24 ENCOUNTER — Non-Acute Institutional Stay: Payer: Medicare PPO | Admitting: Orthopedic Surgery

## 2023-01-24 DIAGNOSIS — D329 Benign neoplasm of meninges, unspecified: Secondary | ICD-10-CM

## 2023-01-24 DIAGNOSIS — R269 Unspecified abnormalities of gait and mobility: Secondary | ICD-10-CM

## 2023-01-24 DIAGNOSIS — R52 Pain, unspecified: Secondary | ICD-10-CM

## 2023-01-24 DIAGNOSIS — R4701 Aphasia: Secondary | ICD-10-CM

## 2023-01-24 NOTE — Progress Notes (Signed)
[facility] Room Number: 621-A Place of Service:  ALF [phone]) Provider:  Hazle Nordmann, NP  PCP: Mahlon Gammon, MD  Patient Care Team: Mahlon Gammon, MD as PCP - General (Internal Medicine) Croitoru, Rachelle Hora, MD as PCP - Cardiology (Cardiology) Nita Sells, MD as Consulting Physician (Dermatology) Nelson Chimes, MD as Consulting Physician (Ophthalmology) Drema Dallas, DO as Consulting Physician (Neurology) Axel Filler Larna Daughters, NP as Nurse Practitioner (Hematology and Oncology)  Extended Emergency Contact Information Primary Emergency Contact: Ray,Ruth  [place] of [place] Home Phone: [phone] Mobile Phone: [phone] Relation: Daughter Secondary Emergency Contact: Corliss [place] Home Phone: [phone] Relation: Son  Code Status:  DNR Goals of care: Advanced Directive information    01/24/2023   10:17 AM  Advanced Directives  Does Patient Have a Medical Advance Directive? Yes  Type of Estate agent of [place];Living will;Out of facility DNR (pink MOST or yellow form)  Does patient want to make changes to medical advance directive? No - Patient declined  Copy of Healthcare Power of Attorney in Chart? Yes - validated most recent copy scanned in chart (See row information)     Chief Complaint  Patient presents with   Acute Visit    Back pain.    HPI:  Pt is a 89 y.o. female seen today for an acute visit due to unstable gait.   She currently resides on the assisted living unit at [facility]. PMH: PAF, HTN, HLD, left breast cancer, meningioma, osteopenia, and gait abnormality.   H/o meningioma. She c/o ongoing paresthesias, weakness, unstable gait and dizziness > 1 month. Recent MRI revealed meningioma increased in size causing compression on medulla. Neurosurgery does not recommend surgical intervention. Neurology reports there is not much that can be done. She is on  Eliquis for stroke prevention. Today, she reports ambulating less on the assisted living unit. Ambulates with rolator. No recent falls reported. She will wake up and sometimes feel weak with back or generalized pain. She is taking tylenol 325 mg. She is requesting power wheelchair or standard wheelchair due to weakness. I have observed her ambulating well around unit. We discussed the benefits of walking versus sitting.   H/o low back pain and radiculopathy. This morning she c/o acute back pain to nursing. She was unable to discuss back pain in detail. H/o aphasia. MMSE 22/30.   No falls since AL admission 07/2022 per chart review.   Past Medical History:  Diagnosis Date   Allergy    Ankle fracture, left    Atrial fibrillation (HCC)    Per [facility] New Patient Packet    Breast cancer (HCC) 1115/16   left    Bursitis of right shoulder    Cataract    Family history of cancer    Fracture of right wrist    GERD (gastroesophageal reflux disease)    Hyperlipidemia    Hypertension    Lumbar radiculitis    Per [facility] New Patient Packet    Meningioma [facility])    Per [facility] New Patient Packet    Skin cancer 2000   melanoma and basal cell   Stress fracture    Right Heel, Per [facility] New Patient Packet    TIA (transient ischemic attack) 2022   Vaso vagal episode    during preparation for colonoscopy   Past Surgical History:  Procedure Laterality Date   ABDOMINAL HYSTERECTOMY  1988   TAH/BSO--FIBROIDS   APPENDECTOMY  BREAST LUMPECTOMY     B/L--[facility]   BREAST LUMPECTOMY WITH RADIOACTIVE SEED AND SENTINEL LYMPH NODE BIOPSY Left 06/11/2015   Procedure: LEFT BREAST LUMPECTOMY WITH RADIOACTIVE SEED AND LEFT SENTINEL LYMPH NODE MAPPING;  Surgeon: Harriette Bouillon, MD;  Location: [facility] OR;  Service: General;  Laterality: Left;   CARDIOVERSION N/A 01/11/2022   Procedure: CARDIOVERSION;  Surgeon: Jodelle Red, MD;  Location[facility] ENDOSCOPY;  Service: Cardiovascular;  Laterality: N/A;   CATARACT EXTRACTION   2015   COLONOSCOPY  2010   DG  BONE DENSITY ([facility] HX)     EYE SURGERY Bilateral    cataracts   fiberadenoma Bilateral 1978, 1980   GANGLION CYST EXCISION     L  hand   Lipiflow procedure      Allergies  Allergen Reactions   Sulfa Antibiotics Other (See Comments)    As a child, became "rigid as a stick and not responsive"   Tape Other (See Comments)    Blisters, Please use "paper" tape only for short periods of time   Wound Dressing Adhesive Other (See Comments)    Blisters, Please use "paper" tape only for short periods of time, Blisters, Please use "paper" tape only for short periods of time   Glucosamine Forte [Nutritional Supplements] Rash   Latex Rash    Allergies as of 01/24/2023       Reactions   Sulfa Antibiotics Other (See Comments)   As a child, became "rigid as a stick and not responsive"   Tape Other (See Comments)   Blisters, Please use "paper" tape only for short periods of time   Wound Dressing Adhesive Other (See Comments)   Blisters, Please use "paper" tape only for short periods of time, Blisters, Please use "paper" tape only for short periods of time   Glucosamine Forte [nutritional Supplements] Rash   Latex Rash        Medication List        Accurate as of January 24, 2023 10:21 AM. If you have any questions, ask your nurse or doctor.          STOP taking these medications    acetaminophen 325 MG tablet Commonly known as: TYLENOL Stopped by: Octavia Heir   NONFORMULARY OR COMPOUNDED ITEM Stopped by: Kailan Laws E Namari Breton       TAKE these medications    Biotin 5 MG Caps Take 5 mg by mouth daily.   Calcium 600+D3 600-10 MG-MCG Tabs Generic drug: Calcium Carb-Cholecalciferol Take 1 tablet by mouth in the morning.   cyanocobalamin 1000 MCG tablet Commonly known as: VITAMIN B12 Take 1 tablet (1,000 mcg total) by mouth daily.   cycloSPORINE 0.05 % ophthalmic emulsion Commonly known as: RESTASIS Place 1 drop into both eyes See admin  instructions. Instill 1 drop into each eye two times a day, after applying a hot compress for 3-5 minutes beforehand   Eliquis 5 MG Tabs tablet Generic drug: apixaban TAKE ONE TABLET BY MOUTH TWICE DAILY   escitalopram 5 MG tablet Commonly known as: LEXAPRO Take 10 mg by mouth daily.   FISH OIL PO Take 1 capsule by mouth daily after breakfast.   loperamide 2 MG tablet Commonly known as: IMODIUM A-D Take 2 mg by mouth 3 (three) times daily as needed for diarrhea or loose stools.   metoprolol succinate 25 MG 24 hr tablet Commonly known as: Toprol XL Take 25 mg every morning   multivitamin with minerals tablet Take 2 tablets by mouth daily with breakfast.  polyethylene glycol 17 g packet Commonly known as: MIRALAX / GLYCOLAX Take 17 g by mouth in the morning. Mix 17 grams of powder into 4-6 ounces of water and drink every morning- may take an additional dose (17 grams) once a day as needed for constipation   pravastatin 40 MG tablet Commonly known as: PRAVACHOL TAKE ONE TABLET BY MOUTH ONCE DAILY   sodium chloride 5 % ophthalmic ointment Commonly known as: MURO 128 Place 1 Application into both eyes at bedtime.   Turmeric 500 MG Caps Take 500 mg by mouth in the morning.   Vitamin D3 25 MCG (1000 UT) Caps Take 1,000 Units by mouth daily.        Review of Systems  Constitutional:  Positive for activity change. Negative for appetite change.  Eyes:  Negative for visual disturbance.  Respiratory:  Negative for cough, shortness of breath and wheezing.   Cardiovascular:  Negative for chest pain and leg swelling.  Gastrointestinal:  Negative for abdominal distention and abdominal pain.  Genitourinary:  Negative for dysuria.  Musculoskeletal:  Positive for arthralgias, back pain, gait problem and myalgias.  Skin:  Negative for wound.  Neurological:  Positive for dizziness, weakness and numbness.  Psychiatric/Behavioral:  Positive for confusion. Negative for dysphoric  mood. The patient is not nervous/anxious.     Immunization History  Administered Date(s) Administered   Fluad Quad(high Dose 65+) 02/27/2020, 03/29/2022   H1N1 07/01/2008   Influenza Whole 04/10/2007, 04/01/2009, 03/26/2010, 03/25/2012   Influenza, High Dose Seasonal PF 04/16/2014, 04/09/2015, 04/03/2017, 04/19/2018, 02/28/2019, 03/08/2021   Influenza-Unspecified 06/27/2016, 04/03/2017   Moderna Sars-Covid-2 Vaccination 04/27/2022   PFIZER Comirnaty(Gray Top)Covid-19 Tri-Sucrose Vaccine 01/05/2021   PFIZER(Purple Top)SARS-COV-2 Vaccination 07/18/2019, 08/08/2019, 03/29/2020   Pneumococcal Conjugate-13 05/13/2014   Pneumococcal Polysaccharide-23 08/29/2016   Td 11/05/2003   Tdap 06/28/2011, 09/19/2011, 10/23/2020   Zoster Recombinant(Shingrix) 10/08/2016, 01/03/2017   Zoster, Live 02/24/2010   Pertinent  Health Maintenance Due  Topic Date Due   INFLUENZA VACCINE  01/26/2023   MAMMOGRAM  07/12/2023   DEXA SCAN  Completed      09/20/2022   10:13 AM 11/22/2022   10:48 AM 12/07/2022   10:04 AM 12/20/2022   10:44 AM 01/03/2023    8:20 AM  Fall Risk  Falls in the past year? 1 0 0 1 1  Was there an injury with Fall? 0 0 0 1 1  Fall Risk Category Calculator 1 0 0 2 2  Patient at Risk for Falls Due to History of fall(s) Impaired mobility;Impaired balance/gait  Impaired balance/gait;Impaired mobility;History of fall(s) History of fall(s);Impaired balance/gait;Impaired mobility  Fall risk Follow up  Falls evaluation completed Falls evaluation completed Falls evaluation completed Falls evaluation completed   Functional Status Survey:    Vitals:   01/24/23 1013  BP: 123/87  Pulse: 78  Resp: 20  Temp: (!) 97.4 F (36.3 C)  SpO2: 97%  Weight: 139 lb 12.8 oz (63.4 kg)  Height: 5\' 1"  (1.549 m)   Body mass index is 26.41 kg/m. Physical Exam Vitals reviewed.  Constitutional:      General: She is not in acute distress. HENT:     Head: Normocephalic.  Eyes:     General:         Right eye: No discharge.        Left eye: No discharge.     Extraocular Movements: Extraocular movements intact.     Pupils: Pupils are equal, round, and reactive to light.  Cardiovascular:     Rate  and Rhythm: Normal rate and regular rhythm.     Pulses: Normal pulses.     Heart sounds: Normal heart sounds.  Pulmonary:     Effort: Pulmonary effort is normal. No respiratory distress.     Breath sounds: Normal breath sounds. No wheezing.  Abdominal:     General: Bowel sounds are normal.     Palpations: Abdomen is soft.  Musculoskeletal:     Cervical back: Neck supple.     Right lower leg: No edema.     Left lower leg: No edema.  Skin:    General: Skin is warm.     Capillary Refill: Capillary refill takes less than 2 seconds.  Neurological:     General: No focal deficit present.     Mental Status: She is alert. Mental status is at baseline.     Sensory: No sensory deficit.     Motor: Weakness present.     Coordination: Coordination normal.     Gait: Gait abnormal.     Deep Tendon Reflexes: Reflexes normal.     Comments: rolator  Psychiatric:        Mood and Affect: Mood normal.     Comments: Aphasia, unable to answer/describe in detail, very pleasant, alert to self/person/place     Labs reviewed: Recent Labs    12/04/22 1906 12/11/22 1937 12/12/22 0859 12/13/22 0411 01/04/23 0000  NA 141 140 139 138 141  K 4.1 5.2* 3.8 3.4* 4.1  CL 106 103 101 102 104  CO2 26 28 25 24  25*  GLUCOSE 96 109* 88 98  --   BUN 28* 21 13 17 16   CREATININE 1.06* 0.86 0.77 0.92 0.8  CALCIUM 9.6 10.2 9.2 8.8* 9.5  MG 2.5*  --   --   --   --    Recent Labs    07/04/22 1219 12/04/22 1906 12/11/22 1937  AST 31 28 22   ALT 53* 27 18  ALKPHOS 69 59 64  BILITOT 1.2 1.1 0.9  PROT 6.1* 6.2* 7.1  ALBUMIN 3.8 3.8 4.2   Recent Labs    12/04/22 1906 12/11/22 1937 12/12/22 0859 12/13/22 0411  WBC 8.5 9.1 10.3 8.4  NEUTROABS 5.8 5.9  --   --   HGB 15.3* 17.6* 17.6* 16.5*  HCT 45.3 51.4*  50.6* 49.0*  MCV 92.1 91.8 90.4 90.1  PLT 191 217 181 208   Lab Results  Component Value Date   TSH 3.099 12/12/2022   Lab Results  Component Value Date   HGBA1C 4.8 05/11/2021   Lab Results  Component Value Date   CHOL 152 08/04/2022   HDL 63 08/04/2022   LDLCALC 70 08/04/2022   LDLDIRECT 153.4 11/16/2009   TRIG 91 08/04/2022   CHOLHDL 2.5 05/11/2021    Significant Diagnostic Results in last 30 days:  No results found.  Assessment/Plan 1. Gait abnormality - ongoing - no recent falls  - I have observed her walking unit without difficulty  - ? related to meningioma and cognition changes - discussed benefits of ambulating versus sitting - PT/OT evaluation for PWC/ wheelchair/changes in gait  2. Meningioma (HCC) - 06/17 MRI brain noted meningioma increasing in size, compressed against medulla - neurosurgery does not recommend surgical intervention - follow by neurology> no new interventions   3. Generalized pain - involves back and extremities - suspect arthritis  - will increase tylenol 1000 mg po QAM - start tylenol 1000 mg po BID PRN  4. Aphasia - MMSE 22/30 - concerns  for impaired cognition - 06/17 MRI brain notes moderate chronic small- vessel ischemic changes - h/o stroke 2022  Family/ staff Communication: plan discussed with patient and nurse  Labs/tests ordered:   PT/OT evaluation for PWC/wheelchair/unstable gait

## 2023-01-30 ENCOUNTER — Other Ambulatory Visit: Payer: Self-pay

## 2023-01-30 ENCOUNTER — Emergency Department (HOSPITAL_COMMUNITY): Payer: Medicare PPO

## 2023-01-30 ENCOUNTER — Encounter (HOSPITAL_COMMUNITY): Payer: Self-pay

## 2023-01-30 ENCOUNTER — Emergency Department (HOSPITAL_COMMUNITY)
Admission: EM | Admit: 2023-01-30 | Discharge: 2023-01-31 | Disposition: A | Discharge status: Home / Self Care | Payer: Medicare PPO | Attending: Emergency Medicine | Admitting: Emergency Medicine

## 2023-01-30 DIAGNOSIS — W1809XA Striking against other object with subsequent fall, initial encounter: Secondary | ICD-10-CM | POA: Insufficient documentation

## 2023-01-30 DIAGNOSIS — S0990XA Unspecified injury of head, initial encounter: Secondary | ICD-10-CM | POA: Diagnosis not present

## 2023-01-30 DIAGNOSIS — R0902 Hypoxemia: Secondary | ICD-10-CM | POA: Diagnosis not present

## 2023-01-30 DIAGNOSIS — S0512XA Contusion of eyeball and orbital tissues, left eye, initial encounter: Secondary | ICD-10-CM

## 2023-01-30 DIAGNOSIS — S0993XA Unspecified injury of face, initial encounter: Secondary | ICD-10-CM | POA: Diagnosis not present

## 2023-01-30 DIAGNOSIS — M6389 Disorders of muscle in diseases classified elsewhere, multiple sites: Secondary | ICD-10-CM | POA: Diagnosis not present

## 2023-01-30 DIAGNOSIS — D320 Benign neoplasm of cerebral meninges: Secondary | ICD-10-CM | POA: Diagnosis not present

## 2023-01-30 DIAGNOSIS — W19XXXA Unspecified fall, initial encounter: Secondary | ICD-10-CM

## 2023-01-30 DIAGNOSIS — S0012XA Contusion of left eyelid and periocular area, initial encounter: Secondary | ICD-10-CM | POA: Insufficient documentation

## 2023-01-30 DIAGNOSIS — Z7901 Long term (current) use of anticoagulants: Secondary | ICD-10-CM | POA: Diagnosis not present

## 2023-01-30 DIAGNOSIS — S199XXA Unspecified injury of neck, initial encounter: Secondary | ICD-10-CM | POA: Diagnosis not present

## 2023-01-30 DIAGNOSIS — M5459 Other low back pain: Secondary | ICD-10-CM | POA: Diagnosis not present

## 2023-01-30 DIAGNOSIS — R55 Syncope and collapse: Secondary | ICD-10-CM | POA: Diagnosis not present

## 2023-01-30 DIAGNOSIS — Z043 Encounter for examination and observation following other accident: Secondary | ICD-10-CM | POA: Diagnosis not present

## 2023-01-30 DIAGNOSIS — I4891 Unspecified atrial fibrillation: Secondary | ICD-10-CM | POA: Diagnosis not present

## 2023-01-30 DIAGNOSIS — Z91040 Latex allergy status: Secondary | ICD-10-CM | POA: Diagnosis not present

## 2023-01-30 DIAGNOSIS — R278 Other lack of coordination: Secondary | ICD-10-CM | POA: Diagnosis not present

## 2023-01-30 DIAGNOSIS — I10 Essential (primary) hypertension: Secondary | ICD-10-CM | POA: Diagnosis not present

## 2023-01-30 DIAGNOSIS — I6523 Occlusion and stenosis of bilateral carotid arteries: Secondary | ICD-10-CM | POA: Diagnosis not present

## 2023-01-30 DIAGNOSIS — M47816 Spondylosis without myelopathy or radiculopathy, lumbar region: Secondary | ICD-10-CM | POA: Diagnosis not present

## 2023-01-30 DIAGNOSIS — R0989 Other specified symptoms and signs involving the circulatory and respiratory systems: Secondary | ICD-10-CM | POA: Diagnosis not present

## 2023-01-30 LAB — CBC
HCT: 43.8 % (ref 36.0–46.0)
Hemoglobin: 14.9 g/dL (ref 12.0–15.0)
MCH: 30.8 pg (ref 26.0–34.0)
MCHC: 34 g/dL (ref 30.0–36.0)
MCV: 90.7 fL (ref 80.0–100.0)
Platelets: 208 10*3/uL (ref 150–400)
RBC: 4.83 MIL/uL (ref 3.87–5.11)
RDW: 14.6 % (ref 11.5–15.5)
WBC: 8.8 10*3/uL (ref 4.0–10.5)
nRBC: 0 % (ref 0.0–0.2)

## 2023-01-30 LAB — I-STAT CHEM 8, ED
BUN: 32 mg/dL — ABNORMAL HIGH (ref 8–23)
Calcium, Ion: 1.28 mmol/L (ref 1.15–1.40)
Chloride: 103 mmol/L (ref 98–111)
Creatinine, Ser: 1 mg/dL (ref 0.44–1.00)
Glucose, Bld: 123 mg/dL — ABNORMAL HIGH (ref 70–99)
HCT: 45 % (ref 36.0–46.0)
Hemoglobin: 15.3 g/dL — ABNORMAL HIGH (ref 12.0–15.0)
Potassium: 3.8 mmol/L (ref 3.5–5.1)
Sodium: 138 mmol/L (ref 135–145)
TCO2: 26 mmol/L (ref 22–32)

## 2023-01-30 LAB — COMPREHENSIVE METABOLIC PANEL
ALT: 18 U/L (ref 0–44)
AST: 28 U/L (ref 15–41)
Albumin: 3.5 g/dL (ref 3.5–5.0)
Alkaline Phosphatase: 55 U/L (ref 38–126)
Anion gap: 9 (ref 5–15)
BUN: 31 mg/dL — ABNORMAL HIGH (ref 8–23)
CO2: 26 mmol/L (ref 22–32)
Calcium: 10 mg/dL (ref 8.9–10.3)
Chloride: 102 mmol/L (ref 98–111)
Creatinine, Ser: 1.03 mg/dL — ABNORMAL HIGH (ref 0.44–1.00)
GFR, Estimated: 52 mL/min — ABNORMAL LOW (ref >60)
Glucose, Bld: 125 mg/dL — ABNORMAL HIGH (ref 70–99)
Potassium: 4 mmol/L (ref 3.5–5.1)
Sodium: 137 mmol/L (ref 135–145)
Total Bilirubin: 1.2 mg/dL (ref 0.3–1.2)
Total Protein: 5.7 g/dL — ABNORMAL LOW (ref 6.5–8.1)

## 2023-01-30 LAB — I-STAT CG4 LACTIC ACID, ED: Lactic Acid, Venous: 1.8 mmol/L (ref 0.5–1.9)

## 2023-01-30 LAB — SAMPLE TO BLOOD BANK

## 2023-01-30 LAB — PROTIME-INR
INR: 1.4 — ABNORMAL HIGH (ref 0.8–1.2)
Prothrombin Time: 17.2 seconds — ABNORMAL HIGH (ref 11.4–15.2)

## 2023-01-30 NOTE — ED Notes (Signed)
[facility] called, no eta 

## 2023-01-30 NOTE — ED Triage Notes (Signed)
Pt brought by EMS from [place] after unwitnessed fall. 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 (-) LOC. (+) eloquis. Pt A&Ox4. Reports mild headache. Pt reports intermittent blurred vision at baseline. Pt hit head on desk when falling.

## 2023-01-30 NOTE — Discharge Instructions (Signed)
You were seen in the emergency department after your fall.  Your workup showed no signs of injury or obvious cause of your fall.  You did have a bruise to your eye and you can ice your eye and take Tylenol as needed for pain.  You can follow-up with your primary doctor to have your symptoms rechecked.  You should return to the emergency department if you have recurrent falls and injury yourself, you become dizzy and pass out, you have severe chest pain or if you have any other new or concerning symptoms.

## 2023-01-30 NOTE — ED Provider Notes (Signed)
[facility] EMERGENCY DEPARTMENT AT [facility] Provider Note   CSN: [id number] Arrival date & time: 01/30/23  2107     History  Chief Complaint  Patient presents with   Fall    Jessica Mullins is a 89 y.o. female.  Patient is a 89 year old female with a past medical history of A-fib on Eliquis presenting to the emergency department after a fall.  The patient states that she has a meningioma that commonly causes dizziness.  She states that she thinks that she may have been having her normal dizziness today when she fell and hit her face on her desk.  She denies any loss of consciousness.  She denies any current pain from the fall.  She initially reported to EMS that she was having double vision but reports to me that she has had intermittent blurry vision but has had no blurry vision since the fall.  She denies any numbness or weakness, nausea or vomiting.  She denied any chest pain or shortness of breath prior to the fall.  She denies any recent dysuria or hematuria.  The history is provided by the patient.  Fall       Home Medications Prior to Admission medications   Medication Sig Start Date End Date Taking? Authorizing Provider  Biotin 5 MG CAPS Take 5 mg by mouth daily.     [provider]  Calcium Carb-Cholecalciferol (CALCIUM 600+D3) 600-10 MG-MCG TABS Take 1 tablet by mouth in the morning.    [provider]  Cholecalciferol (VITAMIN D3) 1000 UNITS CAPS Take 1,000 Units by mouth daily.    [provider]  cycloSPORINE (RESTASIS) 0.05 % ophthalmic emulsion Place 1 drop into both eyes See admin instructions. Instill 1 drop into each eye two times a day, after applying a hot compress for 3-5 minutes beforehand 10/11/16   [provider]  ELIQUIS 5 MG TABS tablet TAKE ONE TABLET BY MOUTH TWICE DAILY 11/19/21   Loa Socks, NP  escitalopram (LEXAPRO) 5 MG tablet Take 10 mg by mouth daily.    [provider]   loperamide (IMODIUM A-D) 2 MG tablet Take 2 mg by mouth 3 (three) times daily as needed for diarrhea or loose stools.    [provider]  metoprolol succinate (TOPROL XL) 25 MG 24 hr tablet Take 25 mg every morning 06/08/22   Croitoru, Mihai, MD  Multiple Vitamins-Minerals (MULTIVITAMIN WITH MINERALS) tablet Take 2 tablets by mouth daily with breakfast.    [provider]  Omega-3 Fatty Acids (FISH OIL PO) Take 1 capsule by mouth daily after breakfast.    [provider]  polyethylene glycol (MIRALAX / GLYCOLAX) 17 g packet Take 17 g by mouth in the morning. Mix 17 grams of powder into 4-6 ounces of water and drink every morning- may take an additional dose (17 grams) once a day as needed for constipation    [provider]  pravastatin (PRAVACHOL) 40 MG tablet TAKE ONE TABLET BY MOUTH ONCE DAILY 08/05/22   Mahlon Gammon, MD  sodium chloride (MURO 128) 5 % ophthalmic ointment Place 1 Application into both eyes at bedtime.    [provider]  Turmeric 500 MG CAPS Take 500 mg by mouth in the morning.    [provider]  vitamin B-12 (CYANOCOBALAMIN) 1000 MCG tablet Take 1 tablet (1,000 mcg total) by mouth daily. 07/19/18   Serena Croissant, MD      Allergies    Sulfa antibiotics, Tape,  Wound dressing adhesive, Glucosamine forte [nutritional supplements], and Latex    Review of Systems   Review of Systems  Physical Exam Updated Vital Signs BP (!) 120/90 (BP Location: Left Arm)   Pulse 73   Temp 98.3 F (36.8 C) (Oral)   Resp (!) 23   Ht 5\' 1"  (1.549 m)   Wt 63.4 kg   SpO2 94%   BMI 26.41 kg/m  Physical Exam Vitals and nursing note reviewed.  Constitutional:      General: She is not in acute distress.    Appearance: Normal appearance.  HENT:     Head: Normocephalic and atraumatic.     Nose: Nose normal.     Mouth/Throat:     Mouth: Mucous membranes are moist.     Pharynx: Oropharynx is clear.  Eyes:     Extraocular Movements:  Extraocular movements intact.     Conjunctiva/sclera: Conjunctivae normal.     Pupils: Pupils are equal, round, and reactive to light.     Comments: Mild left periorbital bruising  Neck:     Comments: No midline neck tenderness, c-collar in place Cardiovascular:     Rate and Rhythm: Normal rate and regular rhythm.     Heart sounds: Normal heart sounds.  Pulmonary:     Effort: Pulmonary effort is normal.     Breath sounds: Normal breath sounds.  Abdominal:     General: Abdomen is flat.     Palpations: Abdomen is soft.     Tenderness: There is no abdominal tenderness.  Musculoskeletal:        General: Normal range of motion.     Comments: No midline back tenderness No bony tenderness of bilateral upper or lower extremities Pelvis stable, nontender  Skin:    General: Skin is warm and dry.  Neurological:     General: No focal deficit present.     Mental Status: She is alert and oriented to person, place, and time.     Cranial Nerves: No cranial nerve deficit.     Sensory: No sensory deficit.     Motor: No weakness.  Psychiatric:        Mood and Affect: Mood normal.        Behavior: Behavior normal.     ED Results / Procedures / Treatments   Labs (all labs ordered are listed, but only abnormal results are displayed) Labs Reviewed  COMPREHENSIVE METABOLIC PANEL - Abnormal; Notable for the following components:      Result Value   Glucose, Bld 125 (*)    BUN 31 (*)    Creatinine, Ser 1.03 (*)    Total Protein 5.7 (*)    GFR, Estimated 52 (*)    All other components within normal limits  PROTIME-INR - Abnormal; Notable for the following components:   Prothrombin Time 17.2 (*)    INR 1.4 (*)    All other components within normal limits  I-STAT CHEM 8, ED - Abnormal; Notable for the following components:   BUN 32 (*)    Glucose, Bld 123 (*)    Hemoglobin 15.3 (*)    All other components within normal limits  CBC  URINALYSIS, ROUTINE W REFLEX MICROSCOPIC  I-STAT CG4  LACTIC ACID, ED  SAMPLE TO BLOOD BANK    EKG EKG Interpretation Date/Time:  Monday January 30 2023 21:14:24 EDT Ventricular Rate:  96 PR Interval:    QRS Duration:  92 QT Interval:  378 QTC Calculation: 478 R Axis:   -11  Text Interpretation: Atrial fibrillation Ventricular premature complex Borderline repolarization abnormality No significant change since last tracing Confirmed by Elayne Snare ([id number]) on 01/30/2023 10:00:25 PM  Radiology CT HEAD WO CONTRAST  Result Date: 01/30/2023 CLINICAL DATA:  Trauma EXAM: CT HEAD WITHOUT CONTRAST CT MAXILLOFACIAL WITHOUT CONTRAST CT CERVICAL SPINE WITHOUT CONTRAST TECHNIQUE: Multidetector CT imaging of the head, cervical spine, and maxillofacial structures were performed using the standard protocol without intravenous contrast. Multiplanar CT image reconstructions of the cervical spine and maxillofacial structures were also generated. RADIATION DOSE REDUCTION: This exam was performed according to the departmental dose-optimization program which includes automated exposure control, adjustment of the mA and/or kV according to patient size and/or use of iterative reconstruction technique. COMPARISON:  12/11/2022 FINDINGS: CT HEAD FINDINGS Brain: Unchanged 3.0 cm mass at the right cervicomedullary junction with leftward deviation of the brainstem/upper cervical spinal cord. No acute hemorrhage or extra-axial collection. There is generalized atrophy without lobar predilection. There is hypoattenuation of the periventricular white matter, most commonly indicating chronic ischemic microangiopathy. Vascular: Atherosclerotic calcification of the internal carotid arteries at the skull base. No abnormal hyperdensity of the major intracranial arteries or dural venous sinuses. Skull: The visualized skull base, calvarium and extracranial soft tissues are normal. CT MAXILLOFACIAL FINDINGS Osseous: No facial fracture or mandibular dislocation. Orbits: The globes are intact.  Normal appearance of the intra- and extraconal fat. Symmetric extraocular muscles and optic nerves. Sinuses: No fluid levels or advanced mucosal thickening. Soft tissues: Normal visualized extracranial soft tissues. CT CERVICAL SPINE FINDINGS Alignment: No static subluxation. Facets are aligned. Occipital condyles and the lateral masses of C1-C2 are aligned. Skull base and vertebrae: No acute fracture. Soft tissues and spinal canal: No prevertebral fluid or swelling. No visible canal hematoma. Disc levels: No advanced spinal canal or neural foraminal stenosis. Upper chest: No pneumothorax, pulmonary nodule or pleural effusion. Other: Normal visualized paraspinal cervical soft tissues. IMPRESSION: 1. No acute intracranial abnormality. 2. No acute fracture or static subluxation of the cervical spine. 3. No facial fracture. 4. Unchanged appearance of 3.0 cm mass (likely meningioma) at the right cervicomedullary junction with leftward deviation of the brainstem/upper cervical spinal cord. Electronically Signed   By: Deatra Robinson M.D.   On: 01/30/2023 22:24   CT MAXILLOFACIAL WO CONTRAST  Result Date: 01/30/2023 CLINICAL DATA:  Trauma EXAM: CT HEAD WITHOUT CONTRAST CT MAXILLOFACIAL WITHOUT CONTRAST CT CERVICAL SPINE WITHOUT CONTRAST TECHNIQUE: Multidetector CT imaging of the head, cervical spine, and maxillofacial structures were performed using the standard protocol without intravenous contrast. Multiplanar CT image reconstructions of the cervical spine and maxillofacial structures were also generated. RADIATION DOSE REDUCTION: This exam was performed according to the departmental dose-optimization program which includes automated exposure control, adjustment of the mA and/or kV according to patient size and/or use of iterative reconstruction technique. COMPARISON:  12/11/2022 FINDINGS: CT HEAD FINDINGS Brain: Unchanged 3.0 cm mass at the right cervicomedullary junction with leftward deviation of the brainstem/upper  cervical spinal cord. No acute hemorrhage or extra-axial collection. There is generalized atrophy without lobar predilection. There is hypoattenuation of the periventricular white matter, most commonly indicating chronic ischemic microangiopathy. Vascular: Atherosclerotic calcification of the internal carotid arteries at the skull base. No abnormal hyperdensity of the major intracranial arteries or dural venous sinuses. Skull: The visualized skull base, calvarium and extracranial soft tissues are normal. CT MAXILLOFACIAL FINDINGS Osseous: No facial fracture or mandibular dislocation. Orbits: The globes are intact. Normal appearance of the intra- and extraconal fat. Symmetric extraocular muscles and optic nerves. Sinuses: No  fluid levels or advanced mucosal thickening. Soft tissues: Normal visualized extracranial soft tissues. CT CERVICAL SPINE FINDINGS Alignment: No static subluxation. Facets are aligned. Occipital condyles and the lateral masses of C1-C2 are aligned. Skull base and vertebrae: No acute fracture. Soft tissues and spinal canal: No prevertebral fluid or swelling. No visible canal hematoma. Disc levels: No advanced spinal canal or neural foraminal stenosis. Upper chest: No pneumothorax, pulmonary nodule or pleural effusion. Other: Normal visualized paraspinal cervical soft tissues. IMPRESSION: 1. No acute intracranial abnormality. 2. No acute fracture or static subluxation of the cervical spine. 3. No facial fracture. 4. Unchanged appearance of 3.0 cm mass (likely meningioma) at the right cervicomedullary junction with leftward deviation of the brainstem/upper cervical spinal cord. Electronically Signed   By: Deatra Robinson M.D.   On: 01/30/2023 22:24   CT CERVICAL SPINE WO CONTRAST  Result Date: 01/30/2023 CLINICAL DATA:  Trauma EXAM: CT HEAD WITHOUT CONTRAST CT MAXILLOFACIAL WITHOUT CONTRAST CT CERVICAL SPINE WITHOUT CONTRAST TECHNIQUE: Multidetector CT imaging of the head, cervical spine, and  maxillofacial structures were performed using the standard protocol without intravenous contrast. Multiplanar CT image reconstructions of the cervical spine and maxillofacial structures were also generated. RADIATION DOSE REDUCTION: This exam was performed according to the departmental dose-optimization program which includes automated exposure control, adjustment of the mA and/or kV according to patient size and/or use of iterative reconstruction technique. COMPARISON:  12/11/2022 FINDINGS: CT HEAD FINDINGS Brain: Unchanged 3.0 cm mass at the right cervicomedullary junction with leftward deviation of the brainstem/upper cervical spinal cord. No acute hemorrhage or extra-axial collection. There is generalized atrophy without lobar predilection. There is hypoattenuation of the periventricular white matter, most commonly indicating chronic ischemic microangiopathy. Vascular: Atherosclerotic calcification of the internal carotid arteries at the skull base. No abnormal hyperdensity of the major intracranial arteries or dural venous sinuses. Skull: The visualized skull base, calvarium and extracranial soft tissues are normal. CT MAXILLOFACIAL FINDINGS Osseous: No facial fracture or mandibular dislocation. Orbits: The globes are intact. Normal appearance of the intra- and extraconal fat. Symmetric extraocular muscles and optic nerves. Sinuses: No fluid levels or advanced mucosal thickening. Soft tissues: Normal visualized extracranial soft tissues. CT CERVICAL SPINE FINDINGS Alignment: No static subluxation. Facets are aligned. Occipital condyles and the lateral masses of C1-C2 are aligned. Skull base and vertebrae: No acute fracture. Soft tissues and spinal canal: No prevertebral fluid or swelling. No visible canal hematoma. Disc levels: No advanced spinal canal or neural foraminal stenosis. Upper chest: No pneumothorax, pulmonary nodule or pleural effusion. Other: Normal visualized paraspinal cervical soft tissues.  IMPRESSION: 1. No acute intracranial abnormality. 2. No acute fracture or static subluxation of the cervical spine. 3. No facial fracture. 4. Unchanged appearance of 3.0 cm mass (likely meningioma) at the right cervicomedullary junction with leftward deviation of the brainstem/upper cervical spinal cord. Electronically Signed   By: Deatra Robinson M.D.   On: 01/30/2023 22:24   DG Pelvis Portable  Result Date: 01/30/2023 CLINICAL DATA:  Status post fall. EXAM: PORTABLE PELVIS 1-2 VIEWS COMPARISON:  None Available. FINDINGS: There is no evidence of pelvic fracture or diastasis. No pelvic bone lesions are seen. Marked severity degenerative changes are seen within the visualized portion of the lower lumbar spine. Multiple radiopaque surgical clips are seen overlying the pelvis. IMPRESSION: 1. No acute osseous abnormality. Electronically Signed   By: Aram Candela M.D.   On: 01/30/2023 21:42   DG Chest Port 1 View  Result Date: 01/30/2023 CLINICAL DATA:  Status post fall. EXAM:  PORTABLE CHEST 1 VIEW COMPARISON:  [place] 16, 2024 FINDINGS: The cardiac silhouette is mildly enlarged and unchanged in size. There is moderate severity calcification of the aortic arch. Low lung volumes are noted, without evidence of acute infiltrate, pleural effusion or pneumothorax. Radiopaque surgical clips are seen within the soft tissues of the left breast. No acute osseous abnormalities are identified. IMPRESSION: Low lung volumes without acute or active cardiopulmonary disease. Electronically Signed   By: Aram Candela M.D.   On: 01/30/2023 21:39    Procedures Procedures    Medications Ordered in ED Medications - No data to display  ED Course/ Medical Decision Making/ A&P Clinical Course as of 01/30/23 2340  Mon Jan 30, 2023  2234 Labs within normal range, no acute traumatic injury. [VK]    Clinical Course User Index [VK] Rexford Maus, DO                                 Medical Decision Making This  patient presents to the ED with chief complaint(s) of fall with pertinent past medical history of a fib on Eliquis, meningioma which further complicates the presenting complaint. The complaint involves an extensive differential diagnosis and also carries with it a high risk of complications and morbidity.    The differential diagnosis includes due to patient's fall on thinners concern for ICH or mass effect, cervical spine fracture, no other traumatic injury seen on exam, with questionable dizziness concern for possible syncopal fall, arrhythmia, anemia, dehydration, electrolyte abnormality, infection  Additional history obtained: Additional history obtained from EMS  Records reviewed Nursing Home Documents  ED Course and Reassessment: On patient's arrival she was hemodynamically stable in no acute distress.  Due to her fall on thinners she was made a level 2 trauma and I was immediately present at bedside on her arrival.  Primary survey was intact.  Secondary survey was significant for left periorbital bruising no other traumatic injuries seen.  The patient will have EKG and labs performed with possible dizziness causing the fall and will have CT head, C-spine, chest x-ray and pelvis x-ray to evaluate for traumatic injury.  Independent labs interpretation:  The following labs were independently interpreted: Within normal range  Independent visualization of imaging: - I independently visualized the following imaging with scope of interpretation limited to determining acute life threatening conditions related to emergency care: CT head/C-spine, CT max face, chest x-ray, pelvis x-ray, which revealed no acute traumatic injury  Consultation: - Consulted or discussed management/test interpretation w/ external professional: N/A  Consideration for admission or further workup: Patient has no emergent conditions requiring admission or further work-up at this time and is stable for discharge home with  primary care follow-up  Social Determinants of health: N/A    Amount and/or Complexity of Data Reviewed Labs: ordered. Radiology: ordered.          Final Clinical Impression(s) / ED Diagnoses Final diagnoses:  Fall, initial encounter  Periorbital contusion of left eye, initial encounter    Rx / DC Orders ED Discharge Orders     None         Rexford Maus, DO 01/30/23 2340

## 2023-01-30 NOTE — Progress Notes (Signed)
Orthopedic Tech Progress Note Patient Details:  Jessica Mullins 1930-09-20 [id number]  Patient ID: Jessica Mullins, female   DOB: October 07, 1930, 89 y.o.   MRN: [id number] I attended trauma page. Trinna Post 01/30/2023, 11:49 PM

## 2023-01-31 ENCOUNTER — Non-Acute Institutional Stay: Payer: Self-pay | Admitting: Orthopedic Surgery

## 2023-01-31 ENCOUNTER — Encounter: Payer: Self-pay | Admitting: Orthopedic Surgery

## 2023-01-31 DIAGNOSIS — R42 Dizziness and giddiness: Secondary | ICD-10-CM | POA: Diagnosis not present

## 2023-01-31 DIAGNOSIS — R531 Weakness: Secondary | ICD-10-CM | POA: Diagnosis not present

## 2023-01-31 DIAGNOSIS — Z7401 Bed confinement status: Secondary | ICD-10-CM | POA: Diagnosis not present

## 2023-01-31 DIAGNOSIS — W19XXXA Unspecified fall, initial encounter: Secondary | ICD-10-CM

## 2023-01-31 DIAGNOSIS — M5459 Other low back pain: Secondary | ICD-10-CM | POA: Diagnosis not present

## 2023-01-31 DIAGNOSIS — R600 Localized edema: Secondary | ICD-10-CM

## 2023-01-31 DIAGNOSIS — M6389 Disorders of muscle in diseases classified elsewhere, multiple sites: Secondary | ICD-10-CM | POA: Diagnosis not present

## 2023-01-31 DIAGNOSIS — R278 Other lack of coordination: Secondary | ICD-10-CM | POA: Diagnosis not present

## 2023-01-31 NOTE — Progress Notes (Signed)
Location:  [facility] Room Number: 621-A Place of Service:  ALF [phone]) Provider: Hazle Nordmann, NP  Code Status: DNR Goals of Care:     01/31/2023   10:49 AM  Advanced Directives  Does Patient Have a Medical Advance Directive? Yes  Type of Estate agent of [place];Living will;Out of facility DNR (pink MOST or yellow form)  Does patient want to make changes to medical advance directive? No - Patient declined  Copy of Healthcare Power of Attorney in Chart? Yes - validated most recent copy scanned in chart (See row information)     Chief Complaint  Patient presents with   Hospitalization Follow-up    HPI: Patient is a 89 y.o. female seen today f/u s/p ED visit 08/05 due to fall.   She currently resides on the assisted living unit at [facility]. PMH: PAF, HTN, HLD, left breast cancer, meningioma, osteopenia, and gait abnormality.   "H/o meningioma. She c/o ongoing paresthesias, weakness, unstable gait and dizziness > 1 month. Recent MRI revealed meningioma increased in size causing compression on medulla. Neurosurgery does not recommend surgical intervention. Neurology reports there is not much that can be done. She is on Eliquis for stroke prevention. " Yesterday evening, she was found by nursing staff on the floor. Apparently she fell forward and hit left eye and head. She was sent to ED since she is on Eliquis. CT head/spine/maxillofacial with no acute intracranial abnormality, fracture, subluxation or facial fracture/ meningioma size unchanged. CXR and pelvic xray no acute injury. Labs unremarkable. EKG afib with PAC's. She was discharged back to [facility] a few hours later.   Today, she c/o subtle dizziness when she changes positions. She denies HA, N/V or blurred vision. She is currently followed by OT. Discussed falls safety measures. Advised to call of nursing if she feels dizziness, change positions slowly, and stay hydrated with  water. She does have bruising and mild swelling to left eye. She denies pain or changes in vision. Afebrile. Vitals stable.   Past Medical History:  Diagnosis Date   Allergy    Ankle fracture, left    Atrial fibrillation (HCC)    Per [facility] New Patient Packet    Breast cancer (HCC) 1115/16   left    Bursitis of right shoulder    Cataract    Family history of cancer    Fracture of right wrist    GERD (gastroesophageal reflux disease)    Hyperlipidemia    Hypertension    Lumbar radiculitis    Per [facility] New Patient Packet    Meningioma [facility])    Per [facility] New Patient Packet    Skin cancer 2000   melanoma and basal cell   Stress fracture    Right Heel, Per [facility] New Patient Packet    TIA (transient ischemic attack) 2022   Vaso vagal episode    during preparation for colonoscopy    Past Surgical History:  Procedure Laterality Date   ABDOMINAL HYSTERECTOMY  1988   TAH/BSO--FIBROIDS   APPENDECTOMY     BREAST LUMPECTOMY     B/L--[facility]   BREAST LUMPECTOMY WITH RADIOACTIVE SEED AND SENTINEL LYMPH NODE BIOPSY Left 06/11/2015   Procedure: LEFT BREAST LUMPECTOMY WITH RADIOACTIVE SEED AND LEFT SENTINEL LYMPH NODE MAPPING;  Surgeon: Harriette Bouillon, MD;  Location: [facility] OR;  Service: General;  Laterality: Left;   CARDIOVERSION N/A 01/11/2022   Procedure: CARDIOVERSION;  Surgeon: Jodelle Red, MD;  Location: [facility] ENDOSCOPY;  Service: Cardiovascular;  Laterality:  N/A;   CATARACT EXTRACTION  2015   COLONOSCOPY  2010   DG  BONE DENSITY ([facility] HX)     EYE SURGERY Bilateral    cataracts   fiberadenoma Bilateral 1978, 1980   GANGLION CYST EXCISION     L  hand   Lipiflow procedure      Allergies  Allergen Reactions   Sulfa Antibiotics Other (See Comments)    As a child, became "rigid as a stick and not responsive"   Tape Other (See Comments)    Blisters, Please use "paper" tape only for short periods of time   Wound Dressing Adhesive Other (See Comments)    Blisters, Please use "paper" tape  only for short periods of time, Blisters, Please use "paper" tape only for short periods of time   Glucosamine Forte [Nutritional Supplements] Rash   Latex Rash    Outpatient Encounter Medications as of 01/31/2023  Medication Sig   acetaminophen (TYLENOL) 500 MG tablet Take 1,000 mg by mouth as needed.   acetaminophen (TYLENOL) 500 MG tablet Take 1,000 mg by mouth every morning.   Biotin 5 MG CAPS Take 5 mg by mouth daily.    Calcium Carb-Cholecalciferol (CALCIUM 600+D3) 600-10 MG-MCG TABS Take 1 tablet by mouth in the morning.   Cholecalciferol (VITAMIN D3) 1000 UNITS CAPS Take 1,000 Units by mouth daily.   cycloSPORINE (RESTASIS) 0.05 % ophthalmic emulsion Place 1 drop into both eyes See admin instructions. Instill 1 drop into each eye two times a day, after applying a hot compress for 3-5 minutes beforehand   ELIQUIS 5 MG TABS tablet TAKE ONE TABLET BY MOUTH TWICE DAILY   escitalopram (LEXAPRO) 5 MG tablet Take 10 mg by mouth daily.   loperamide (IMODIUM A-D) 2 MG tablet Take 2 mg by mouth 3 (three) times daily as needed for diarrhea or loose stools.   metoprolol succinate (TOPROL XL) 25 MG 24 hr tablet Take 25 mg every morning   Multiple Vitamins-Minerals (MULTIVITAMIN WITH MINERALS) tablet Take 2 tablets by mouth daily with breakfast.   Omega-3 Fatty Acids (FISH OIL PO) Take 1 capsule by mouth daily after breakfast.   polyethylene glycol (MIRALAX / GLYCOLAX) 17 g packet Take 17 g by mouth in the morning. Mix 17 grams of powder into 4-6 ounces of water and drink every morning- may take an additional dose (17 grams) once a day as needed for constipation   pravastatin (PRAVACHOL) 40 MG tablet TAKE ONE TABLET BY MOUTH ONCE DAILY   sodium chloride (MURO 128) 5 % ophthalmic ointment Place 1 Application into both eyes at bedtime.   Turmeric 500 MG CAPS Take 500 mg by mouth in the morning.   vitamin B-12 (CYANOCOBALAMIN) 1000 MCG tablet Take 1 tablet (1,000 mcg total) by mouth daily.   No  facility-administered encounter medications on file as of 01/31/2023.    Review of Systems:  Review of Systems  Constitutional:  Negative for activity change and appetite change.  HENT: Negative.    Eyes:  Negative for visual disturbance.  Respiratory:  Negative for cough and shortness of breath.   Cardiovascular:  Negative for chest pain and leg swelling.  Gastrointestinal:  Negative for nausea and vomiting.  Genitourinary: Negative.   Musculoskeletal:  Positive for gait problem.  Skin:  Positive for wound.  Neurological:  Positive for dizziness, weakness and numbness. Negative for headaches.  Psychiatric/Behavioral:  Positive for confusion. Negative for dysphoric mood. The patient is not nervous/anxious.     Health Maintenance  Topic Date Due   Medicare Annual Wellness (AWV)  11/13/2020   INFLUENZA VACCINE  01/26/2023   COVID-19 Vaccine (6 - 2023-24 season) 02/15/2023 (Originally 06/22/2022)   MAMMOGRAM  07/12/2023   DTaP/Tdap/Td (5 - Td or Tdap) 10/24/2030   Pneumonia Vaccine 59+ Years old  Completed   DEXA SCAN  Completed   Zoster Vaccines- Shingrix  Completed   HPV VACCINES  Aged Out    Physical Exam: Vitals:   01/31/23 1027  BP: (!) 149/97  Pulse: 96  Resp: (!) 22  Temp: 98.2 F (36.8 C)  SpO2: 97%  Weight: 140 lb 3.2 oz (63.6 kg)  Height: 5\' 1"  (1.549 m)   Body mass index is 26.49 kg/m. Physical Exam Vitals reviewed.  Constitutional:      General: She is not in acute distress. HENT:     Head: Normocephalic and atraumatic. Left periorbital erythema present. No raccoon eyes or Battle's sign.     Nose: Nose normal.     Mouth/Throat:     Mouth: Mucous membranes are moist.  Eyes:     General:        Right eye: No discharge.        Left eye: No discharge.     Extraocular Movements: Extraocular movements intact.     Right eye: Normal extraocular motion and no nystagmus.     Left eye: Normal extraocular motion and no nystagmus.     Pupils: Pupils are equal,  round, and reactive to light.  Cardiovascular:     Rate and Rhythm: Normal rate. Rhythm irregular.     Pulses: Normal pulses.     Heart sounds: Normal heart sounds.  Pulmonary:     Effort: Pulmonary effort is normal. No respiratory distress.     Breath sounds: Normal breath sounds. No wheezing.  Abdominal:     General: Bowel sounds are normal.     Palpations: Abdomen is soft.  Musculoskeletal:     Cervical back: Neck supple.     Right lower leg: No edema.     Left lower leg: No edema.  Skin:    General: Skin is warm.     Capillary Refill: Capillary refill takes less than 2 seconds.  Neurological:     General: No focal deficit present.     Mental Status: She is oriented to person, place, and time.     Motor: Weakness present.     Gait: Gait abnormal.     Comments: rolator  Psychiatric:        Mood and Affect: Mood normal.     Labs reviewed: Basic Metabolic Panel: Recent Labs    07/07/22 0000 08/04/22 0000 12/04/22 1906 12/11/22 1937 12/12/22 0859 12/13/22 0411 01/04/23 0000 01/30/23 2123 01/30/23 2133  NA  --    < > 141   < > 139 138 141 137 138  K  --    < > 4.1   < > 3.8 3.4* 4.1 4.0 3.8  CL  --    < > 106   < > 101 102 104 102 103  CO2  --    < > 26   < > 25 24 25* 26  --   GLUCOSE  --   --  96   < > 88 98  --  125* 123*  BUN  --    < > 28*   < > 13 17 16  31* 32*  CREATININE  --    < > 1.06*   < >  0.77 0.92 0.8 1.03* 1.00  CALCIUM  --    < > 9.6   < > 9.2 8.8* 9.5 10.0  --   MG  --   --  2.5*  --   --   --   --   --   --   TSH 1.92  --   --   --  3.099  --   --   --   --    < > = values in this interval not displayed.   Liver Function Tests: Recent Labs    12/04/22 1906 12/11/22 1937 01/30/23 2123  AST 28 22 28   ALT 27 18 18   ALKPHOS 59 64 55  BILITOT 1.1 0.9 1.2  PROT 6.2* 7.1 5.7*  ALBUMIN 3.8 4.2 3.5   No results for input(s): "LIPASE", "AMYLASE" in the last 8760 hours. Recent Labs    12/11/22 2043  AMMONIA 12   CBC: Recent Labs     12/04/22 1906 12/11/22 1937 12/12/22 0859 12/13/22 0411 01/30/23 2123 01/30/23 2133  WBC 8.5 9.1 10.3 8.4 8.8  --   NEUTROABS 5.8 5.9  --   --   --   --   HGB 15.3* 17.6* 17.6* 16.5* 14.9 15.3*  HCT 45.3 51.4* 50.6* 49.0* 43.8 45.0  MCV 92.1 91.8 90.4 90.1 90.7  --   PLT 191 217 181 208 208  --    Lipid Panel: Recent Labs    02/10/22 0000 04/21/22 0000 08/04/22 0000  CHOL 142 122 152  HDL 62 58 63  LDLCALC 64 50 70  TRIG 81 66 91   Lab Results  Component Value Date   HGBA1C 4.8 05/11/2021    Procedures since last visit: CT HEAD WO CONTRAST  Result Date: 01/30/2023 CLINICAL DATA:  Trauma EXAM: CT HEAD WITHOUT CONTRAST CT MAXILLOFACIAL WITHOUT CONTRAST CT CERVICAL SPINE WITHOUT CONTRAST TECHNIQUE: Multidetector CT imaging of the head, cervical spine, and maxillofacial structures were performed using the standard protocol without intravenous contrast. Multiplanar CT image reconstructions of the cervical spine and maxillofacial structures were also generated. RADIATION DOSE REDUCTION: This exam was performed according to the departmental dose-optimization program which includes automated exposure control, adjustment of the mA and/or kV according to patient size and/or use of iterative reconstruction technique. COMPARISON:  12/11/2022 FINDINGS: CT HEAD FINDINGS Brain: Unchanged 3.0 cm mass at the right cervicomedullary junction with leftward deviation of the brainstem/upper cervical spinal cord. No acute hemorrhage or extra-axial collection. There is generalized atrophy without lobar predilection. There is hypoattenuation of the periventricular white matter, most commonly indicating chronic ischemic microangiopathy. Vascular: Atherosclerotic calcification of the internal carotid arteries at the skull base. No abnormal hyperdensity of the major intracranial arteries or dural venous sinuses. Skull: The visualized skull base, calvarium and extracranial soft tissues are normal. CT  MAXILLOFACIAL FINDINGS Osseous: No facial fracture or mandibular dislocation. Orbits: The globes are intact. Normal appearance of the intra- and extraconal fat. Symmetric extraocular muscles and optic nerves. Sinuses: No fluid levels or advanced mucosal thickening. Soft tissues: Normal visualized extracranial soft tissues. CT CERVICAL SPINE FINDINGS Alignment: No static subluxation. Facets are aligned. Occipital condyles and the lateral masses of C1-C2 are aligned. Skull base and vertebrae: No acute fracture. Soft tissues and spinal canal: No prevertebral fluid or swelling. No visible canal hematoma. Disc levels: No advanced spinal canal or neural foraminal stenosis. Upper chest: No pneumothorax, pulmonary nodule or pleural effusion. Other: Normal visualized paraspinal cervical soft tissues. IMPRESSION: 1. No acute intracranial abnormality. 2.  No acute fracture or static subluxation of the cervical spine. 3. No facial fracture. 4. Unchanged appearance of 3.0 cm mass (likely meningioma) at the right cervicomedullary junction with leftward deviation of the brainstem/upper cervical spinal cord. Electronically Signed   By: Deatra Robinson M.D.   On: 01/30/2023 22:24   CT MAXILLOFACIAL WO CONTRAST  Result Date: 01/30/2023 CLINICAL DATA:  Trauma EXAM: CT HEAD WITHOUT CONTRAST CT MAXILLOFACIAL WITHOUT CONTRAST CT CERVICAL SPINE WITHOUT CONTRAST TECHNIQUE: Multidetector CT imaging of the head, cervical spine, and maxillofacial structures were performed using the standard protocol without intravenous contrast. Multiplanar CT image reconstructions of the cervical spine and maxillofacial structures were also generated. RADIATION DOSE REDUCTION: This exam was performed according to the departmental dose-optimization program which includes automated exposure control, adjustment of the mA and/or kV according to patient size and/or use of iterative reconstruction technique. COMPARISON:  12/11/2022 FINDINGS: CT HEAD FINDINGS  Brain: Unchanged 3.0 cm mass at the right cervicomedullary junction with leftward deviation of the brainstem/upper cervical spinal cord. No acute hemorrhage or extra-axial collection. There is generalized atrophy without lobar predilection. There is hypoattenuation of the periventricular white matter, most commonly indicating chronic ischemic microangiopathy. Vascular: Atherosclerotic calcification of the internal carotid arteries at the skull base. No abnormal hyperdensity of the major intracranial arteries or dural venous sinuses. Skull: The visualized skull base, calvarium and extracranial soft tissues are normal. CT MAXILLOFACIAL FINDINGS Osseous: No facial fracture or mandibular dislocation. Orbits: The globes are intact. Normal appearance of the intra- and extraconal fat. Symmetric extraocular muscles and optic nerves. Sinuses: No fluid levels or advanced mucosal thickening. Soft tissues: Normal visualized extracranial soft tissues. CT CERVICAL SPINE FINDINGS Alignment: No static subluxation. Facets are aligned. Occipital condyles and the lateral masses of C1-C2 are aligned. Skull base and vertebrae: No acute fracture. Soft tissues and spinal canal: No prevertebral fluid or swelling. No visible canal hematoma. Disc levels: No advanced spinal canal or neural foraminal stenosis. Upper chest: No pneumothorax, pulmonary nodule or pleural effusion. Other: Normal visualized paraspinal cervical soft tissues. IMPRESSION: 1. No acute intracranial abnormality. 2. No acute fracture or static subluxation of the cervical spine. 3. No facial fracture. 4. Unchanged appearance of 3.0 cm mass (likely meningioma) at the right cervicomedullary junction with leftward deviation of the brainstem/upper cervical spinal cord. Electronically Signed   By: Deatra Robinson M.D.   On: 01/30/2023 22:24   CT CERVICAL SPINE WO CONTRAST  Result Date: 01/30/2023 CLINICAL DATA:  Trauma EXAM: CT HEAD WITHOUT CONTRAST CT MAXILLOFACIAL WITHOUT  CONTRAST CT CERVICAL SPINE WITHOUT CONTRAST TECHNIQUE: Multidetector CT imaging of the head, cervical spine, and maxillofacial structures were performed using the standard protocol without intravenous contrast. Multiplanar CT image reconstructions of the cervical spine and maxillofacial structures were also generated. RADIATION DOSE REDUCTION: This exam was performed according to the departmental dose-optimization program which includes automated exposure control, adjustment of the mA and/or kV according to patient size and/or use of iterative reconstruction technique. COMPARISON:  12/11/2022 FINDINGS: CT HEAD FINDINGS Brain: Unchanged 3.0 cm mass at the right cervicomedullary junction with leftward deviation of the brainstem/upper cervical spinal cord. No acute hemorrhage or extra-axial collection. There is generalized atrophy without lobar predilection. There is hypoattenuation of the periventricular white matter, most commonly indicating chronic ischemic microangiopathy. Vascular: Atherosclerotic calcification of the internal carotid arteries at the skull base. No abnormal hyperdensity of the major intracranial arteries or dural venous sinuses. Skull: The visualized skull base, calvarium and extracranial soft tissues are normal. CT MAXILLOFACIAL FINDINGS Osseous:  No facial fracture or mandibular dislocation. Orbits: The globes are intact. Normal appearance of the intra- and extraconal fat. Symmetric extraocular muscles and optic nerves. Sinuses: No fluid levels or advanced mucosal thickening. Soft tissues: Normal visualized extracranial soft tissues. CT CERVICAL SPINE FINDINGS Alignment: No static subluxation. Facets are aligned. Occipital condyles and the lateral masses of C1-C2 are aligned. Skull base and vertebrae: No acute fracture. Soft tissues and spinal canal: No prevertebral fluid or swelling. No visible canal hematoma. Disc levels: No advanced spinal canal or neural foraminal stenosis. Upper chest: No  pneumothorax, pulmonary nodule or pleural effusion. Other: Normal visualized paraspinal cervical soft tissues. IMPRESSION: 1. No acute intracranial abnormality. 2. No acute fracture or static subluxation of the cervical spine. 3. No facial fracture. 4. Unchanged appearance of 3.0 cm mass (likely meningioma) at the right cervicomedullary junction with leftward deviation of the brainstem/upper cervical spinal cord. Electronically Signed   By: Deatra Robinson M.D.   On: 01/30/2023 22:24   DG Pelvis Portable  Result Date: 01/30/2023 CLINICAL DATA:  Status post fall. EXAM: PORTABLE PELVIS 1-2 VIEWS COMPARISON:  None Available. FINDINGS: There is no evidence of pelvic fracture or diastasis. No pelvic bone lesions are seen. Marked severity degenerative changes are seen within the visualized portion of the lower lumbar spine. Multiple radiopaque surgical clips are seen overlying the pelvis. IMPRESSION: 1. No acute osseous abnormality. Electronically Signed   By: Aram Candela M.D.   On: 01/30/2023 21:42   DG Chest Port 1 View  Result Date: 01/30/2023 CLINICAL DATA:  Status post fall. EXAM: PORTABLE CHEST 1 VIEW COMPARISON:  Mariza 16, 2024 FINDINGS: The cardiac silhouette is mildly enlarged and unchanged in size. There is moderate severity calcification of the aortic arch. Low lung volumes are noted, without evidence of acute infiltrate, pleural effusion or pneumothorax. Radiopaque surgical clips are seen within the soft tissues of the left breast. No acute osseous abnormalities are identified. IMPRESSION: Low lung volumes without acute or active cardiopulmonary disease. Electronically Signed   By: Aram Candela M.D.   On: 01/30/2023 21:39    Assessment/Plan: 1. Periorbital edema of left eye - 08/05 mechanical fall - mild swelling/bruising to left eye - denies pain, changes in vision - ice applications prn  2. Fall, initial encounter - see above - OT ordered last week due to ongoing dizziness/  weakness - discussed falls safety, advised to sit and use call bell when dizzy, rise slowly, stay hydrated - consider stopping Eliquis if falls continue   3. Dizziness - ED workup normal - EKG afib with PAC's - hgb 14.9, BUN/creat 32/1.0 - see above    Labs/tests ordered:  none Next appt:  02/01/2023

## 2023-01-31 NOTE — ED Notes (Signed)
Trauma Response Nurse Documentation   Jessica Mullins is a 89 y.o. female arriving to [facility] ED via EMS  On Eliquis (apixaban) daily. Trauma was activated as a Level 2 by ED charge RN based on the following trauma criteria Elderly patients > 65 with head trauma on anti-coagulation (excluding ASA).  Patient cleared for CT by Dr. Theresia Lo EDP. Pt transported to CT with trauma response nurse present to monitor. RN remained with the patient throughout their absence from the department for clinical observation.   GCS 15.  History   Past Medical History:  Diagnosis Date   Allergy    Ankle fracture, left    Atrial fibrillation (HCC)    Per [facility] New Patient Packet    Breast cancer (HCC) 1115/16   left    Bursitis of right shoulder    Cataract    Family history of cancer    Fracture of right wrist    GERD (gastroesophageal reflux disease)    Hyperlipidemia    Hypertension    Lumbar radiculitis    Per [facility] New Patient Packet    Meningioma [facility])    Per [facility] New Patient Packet    Skin cancer 2000   melanoma and basal cell   Stress fracture    Right Heel, Per [facility] New Patient Packet    TIA (transient ischemic attack) 2022   Vaso vagal episode    during preparation for colonoscopy     Past Surgical History:  Procedure Laterality Date   ABDOMINAL HYSTERECTOMY  1988   TAH/BSO--FIBROIDS   APPENDECTOMY     BREAST LUMPECTOMY     B/L--[facility]   BREAST LUMPECTOMY WITH RADIOACTIVE SEED AND SENTINEL LYMPH NODE BIOPSY Left 06/11/2015   Procedure: LEFT BREAST LUMPECTOMY WITH RADIOACTIVE SEED AND LEFT SENTINEL LYMPH NODE MAPPING;  Surgeon: Harriette Bouillon, MD;  Location: [facility] OR;  Service: General;  Laterality: Left;   CARDIOVERSION N/A 01/11/2022   Procedure: CARDIOVERSION;  Surgeon: Jodelle Red, MD;  Location: [facility] ENDOSCOPY;  Service: Cardiovascular;  Laterality: N/A;   CATARACT EXTRACTION  2015   COLONOSCOPY  2010   DG  BONE DENSITY ([facility] HX)     EYE SURGERY Bilateral    cataracts    fiberadenoma Bilateral 1978, 1980   GANGLION CYST EXCISION     L  hand   Lipiflow procedure         Initial Focused Assessment (If applicable, or please see trauma documentation): Arrives via EMS from [facility] after a fall with head injury, denies LOC. Eliquis for AFIB. Arrives in EMS collar, changed to [place].  Airway patent/unobstructed, BS clear No obvious external hemorrhage GCS 15 PERRLA 3  CT's Completed:   CT Head, CT Maxillofacial, and CT C-Spine   Interventions:  Portable chest and pelvis XRAY CT head, c-spine, max face [place] collar Trauma lab draw  Plan for disposition:  Discharge home   Consults completed:  none   Event Summary: Fall from nursing facility with posterior head injury. No trauma noted. Hx of weakess, multiple falls. Scans negative, D/C back to facility.   Bedside handoff with ED RN Trinna Post.     O   Trauma Response RN  Please call TRN at [phone] for further assistance.

## 2023-02-01 ENCOUNTER — Ambulatory Visit: Payer: Medicare PPO

## 2023-02-01 DIAGNOSIS — M5459 Other low back pain: Secondary | ICD-10-CM | POA: Diagnosis not present

## 2023-02-01 DIAGNOSIS — R278 Other lack of coordination: Secondary | ICD-10-CM | POA: Diagnosis not present

## 2023-02-01 DIAGNOSIS — M6389 Disorders of muscle in diseases classified elsewhere, multiple sites: Secondary | ICD-10-CM | POA: Diagnosis not present

## 2023-02-02 DIAGNOSIS — M6389 Disorders of muscle in diseases classified elsewhere, multiple sites: Secondary | ICD-10-CM | POA: Diagnosis not present

## 2023-02-02 DIAGNOSIS — R278 Other lack of coordination: Secondary | ICD-10-CM | POA: Diagnosis not present

## 2023-02-02 DIAGNOSIS — M5459 Other low back pain: Secondary | ICD-10-CM | POA: Diagnosis not present

## 2023-02-03 DIAGNOSIS — R278 Other lack of coordination: Secondary | ICD-10-CM | POA: Diagnosis not present

## 2023-02-03 DIAGNOSIS — M6389 Disorders of muscle in diseases classified elsewhere, multiple sites: Secondary | ICD-10-CM | POA: Diagnosis not present

## 2023-02-03 DIAGNOSIS — M5459 Other low back pain: Secondary | ICD-10-CM | POA: Diagnosis not present

## 2023-02-06 DIAGNOSIS — R278 Other lack of coordination: Secondary | ICD-10-CM | POA: Diagnosis not present

## 2023-02-06 DIAGNOSIS — M6259 Muscle wasting and atrophy, not elsewhere classified, multiple sites: Secondary | ICD-10-CM | POA: Diagnosis not present

## 2023-02-06 DIAGNOSIS — M5459 Other low back pain: Secondary | ICD-10-CM | POA: Diagnosis not present

## 2023-02-06 DIAGNOSIS — M6389 Disorders of muscle in diseases classified elsewhere, multiple sites: Secondary | ICD-10-CM | POA: Diagnosis not present

## 2023-02-06 DIAGNOSIS — R296 Repeated falls: Secondary | ICD-10-CM | POA: Diagnosis not present

## 2023-02-06 DIAGNOSIS — R2689 Other abnormalities of gait and mobility: Secondary | ICD-10-CM | POA: Diagnosis not present

## 2023-02-07 DIAGNOSIS — M5459 Other low back pain: Secondary | ICD-10-CM | POA: Diagnosis not present

## 2023-02-07 DIAGNOSIS — M6389 Disorders of muscle in diseases classified elsewhere, multiple sites: Secondary | ICD-10-CM | POA: Diagnosis not present

## 2023-02-07 DIAGNOSIS — R278 Other lack of coordination: Secondary | ICD-10-CM | POA: Diagnosis not present

## 2023-02-08 DIAGNOSIS — R278 Other lack of coordination: Secondary | ICD-10-CM | POA: Diagnosis not present

## 2023-02-08 DIAGNOSIS — M6389 Disorders of muscle in diseases classified elsewhere, multiple sites: Secondary | ICD-10-CM | POA: Diagnosis not present

## 2023-02-08 DIAGNOSIS — M5459 Other low back pain: Secondary | ICD-10-CM | POA: Diagnosis not present

## 2023-02-09 DIAGNOSIS — M6389 Disorders of muscle in diseases classified elsewhere, multiple sites: Secondary | ICD-10-CM | POA: Diagnosis not present

## 2023-02-09 DIAGNOSIS — R278 Other lack of coordination: Secondary | ICD-10-CM | POA: Diagnosis not present

## 2023-02-09 DIAGNOSIS — M5459 Other low back pain: Secondary | ICD-10-CM | POA: Diagnosis not present

## 2023-02-10 DIAGNOSIS — R278 Other lack of coordination: Secondary | ICD-10-CM | POA: Diagnosis not present

## 2023-02-10 DIAGNOSIS — R2689 Other abnormalities of gait and mobility: Secondary | ICD-10-CM | POA: Diagnosis not present

## 2023-02-10 DIAGNOSIS — M6389 Disorders of muscle in diseases classified elsewhere, multiple sites: Secondary | ICD-10-CM | POA: Diagnosis not present

## 2023-02-10 DIAGNOSIS — R296 Repeated falls: Secondary | ICD-10-CM | POA: Diagnosis not present

## 2023-02-10 DIAGNOSIS — M5459 Other low back pain: Secondary | ICD-10-CM | POA: Diagnosis not present

## 2023-02-10 DIAGNOSIS — M6259 Muscle wasting and atrophy, not elsewhere classified, multiple sites: Secondary | ICD-10-CM | POA: Diagnosis not present

## 2023-02-13 DIAGNOSIS — R296 Repeated falls: Secondary | ICD-10-CM | POA: Diagnosis not present

## 2023-02-13 DIAGNOSIS — R278 Other lack of coordination: Secondary | ICD-10-CM | POA: Diagnosis not present

## 2023-02-13 DIAGNOSIS — M6389 Disorders of muscle in diseases classified elsewhere, multiple sites: Secondary | ICD-10-CM | POA: Diagnosis not present

## 2023-02-13 DIAGNOSIS — M5459 Other low back pain: Secondary | ICD-10-CM | POA: Diagnosis not present

## 2023-02-13 DIAGNOSIS — M6259 Muscle wasting and atrophy, not elsewhere classified, multiple sites: Secondary | ICD-10-CM | POA: Diagnosis not present

## 2023-02-13 DIAGNOSIS — R2689 Other abnormalities of gait and mobility: Secondary | ICD-10-CM | POA: Diagnosis not present

## 2023-02-14 DIAGNOSIS — R278 Other lack of coordination: Secondary | ICD-10-CM | POA: Diagnosis not present

## 2023-02-14 DIAGNOSIS — M5459 Other low back pain: Secondary | ICD-10-CM | POA: Diagnosis not present

## 2023-02-14 DIAGNOSIS — R296 Repeated falls: Secondary | ICD-10-CM | POA: Diagnosis not present

## 2023-02-14 DIAGNOSIS — R2689 Other abnormalities of gait and mobility: Secondary | ICD-10-CM | POA: Diagnosis not present

## 2023-02-14 DIAGNOSIS — M6389 Disorders of muscle in diseases classified elsewhere, multiple sites: Secondary | ICD-10-CM | POA: Diagnosis not present

## 2023-02-14 DIAGNOSIS — M6259 Muscle wasting and atrophy, not elsewhere classified, multiple sites: Secondary | ICD-10-CM | POA: Diagnosis not present

## 2023-02-15 DIAGNOSIS — M6389 Disorders of muscle in diseases classified elsewhere, multiple sites: Secondary | ICD-10-CM | POA: Diagnosis not present

## 2023-02-15 DIAGNOSIS — R2689 Other abnormalities of gait and mobility: Secondary | ICD-10-CM | POA: Diagnosis not present

## 2023-02-15 DIAGNOSIS — M5459 Other low back pain: Secondary | ICD-10-CM | POA: Diagnosis not present

## 2023-02-15 DIAGNOSIS — R296 Repeated falls: Secondary | ICD-10-CM | POA: Diagnosis not present

## 2023-02-15 DIAGNOSIS — R278 Other lack of coordination: Secondary | ICD-10-CM | POA: Diagnosis not present

## 2023-02-15 DIAGNOSIS — M6259 Muscle wasting and atrophy, not elsewhere classified, multiple sites: Secondary | ICD-10-CM | POA: Diagnosis not present

## 2023-02-16 DIAGNOSIS — R2689 Other abnormalities of gait and mobility: Secondary | ICD-10-CM | POA: Diagnosis not present

## 2023-02-16 DIAGNOSIS — M6389 Disorders of muscle in diseases classified elsewhere, multiple sites: Secondary | ICD-10-CM | POA: Diagnosis not present

## 2023-02-16 DIAGNOSIS — M6259 Muscle wasting and atrophy, not elsewhere classified, multiple sites: Secondary | ICD-10-CM | POA: Diagnosis not present

## 2023-02-16 DIAGNOSIS — M5459 Other low back pain: Secondary | ICD-10-CM | POA: Diagnosis not present

## 2023-02-16 DIAGNOSIS — R296 Repeated falls: Secondary | ICD-10-CM | POA: Diagnosis not present

## 2023-02-16 DIAGNOSIS — R278 Other lack of coordination: Secondary | ICD-10-CM | POA: Diagnosis not present

## 2023-02-17 DIAGNOSIS — M6259 Muscle wasting and atrophy, not elsewhere classified, multiple sites: Secondary | ICD-10-CM | POA: Diagnosis not present

## 2023-02-17 DIAGNOSIS — R2689 Other abnormalities of gait and mobility: Secondary | ICD-10-CM | POA: Diagnosis not present

## 2023-02-17 DIAGNOSIS — R296 Repeated falls: Secondary | ICD-10-CM | POA: Diagnosis not present

## 2023-02-17 DIAGNOSIS — M5459 Other low back pain: Secondary | ICD-10-CM | POA: Diagnosis not present

## 2023-02-17 DIAGNOSIS — R278 Other lack of coordination: Secondary | ICD-10-CM | POA: Diagnosis not present

## 2023-02-17 DIAGNOSIS — M6389 Disorders of muscle in diseases classified elsewhere, multiple sites: Secondary | ICD-10-CM | POA: Diagnosis not present

## 2023-02-19 ENCOUNTER — Encounter: Payer: Self-pay | Admitting: Adult Health

## 2023-02-19 NOTE — Progress Notes (Unsigned)
Location:  [facility]  POS: Clinic  Provider: Fletcher Anon, ANP  Code Status: Full Goals of Care:     01/31/2023   10:49 AM  Advanced Directives  Does Patient Have a Medical Advance Directive? Yes  Type of Estate agent of [place];Living will;Out of facility DNR (pink MOST or yellow form)  Does patient want to make changes to medical advance directive? No - Patient declined  Copy of Healthcare Power of Attorney in Chart? Yes - validated most recent copy scanned in chart (See row information)     Chief Complaint  Patient presents with   Medical Management of Chronic Issues    Patient is being seen for a 3 month follow up   Immunizations    Patient is due for Covid and flu vaccine     HPI: Patient is a 89 y.o. female seen today for medical management of chronic diseases.    PMH significant for CVA, afib, HTN, gait abnormality, HLD, orthostatic hypotension, bradycardia, lumbar radiculitis, meningioma, osteopenia, and breast cancer.   [place]ides in [place] due to dizziness and memory loss [place], retired [employer], broadcasting/film/video  She has been in the ED three times this year. On 01/30/23 she had a fall due to dizziness and had a left periorbital contusion. CT of the head showed unchanged meningioma and hydrocephalus.   Dr Everlena Cooper is her neurologist. He saw her in [place] she was having leg paresthesia. MRI recommended No surgical intervention recommended. MRI 12/12/22 showed increasing size of the meningioma.   Memory loss: worsening over time.  Did poorly in serial 7s and recall on MMSE 22/30 08/21/22 Had trouble spelling world backwards.   Had cardioversion for afib 01/11/22 Had some bradycardia which did improve off cardizem 2nd degree AV block on zio patch July 2023  Poor balance, uses walker which is chronic .   Mammogram 07/11/22 normal  Dexa scan 07/21/21 t score -1.1 on Prolia. Last injection 08/22/22.    Meningioma: MRI of the brain 11/04/21 showed stable  appearance.   follows with neurosurgery  CT of heat 07/04/22 in ED 1. 3.2 x 2.3 x 3.0 cm extra-axial mass centered within the right ventrolateral aspect of the posterior fossa, slightly increased in size from the prior brain MRI of 11/04/2021 (previously 3.0 x 2.3 x 3.0 cm). This is favored to reflect a meningioma.   Afib controlled on Eliquis follows with cardiology on metoprolol  H/o left Breast Cancer Invasive Ductal Carcinoma diagnosed in 2016 S/p lumpectomy, chemo, radiation Past Medical History:  Diagnosis Date   Allergy    Ankle fracture, left    Atrial fibrillation (HCC)    Per [facility] New Patient Packet    Breast cancer (HCC) 1115/16   left    Bursitis of right shoulder    Cataract    Family history of cancer    Fracture of right wrist    GERD (gastroesophageal reflux disease)    Hyperlipidemia    Hypertension    Lumbar radiculitis    Per [facility] New Patient Packet    Meningioma [facility])    Per [facility] New Patient Packet    Skin cancer 2000   melanoma and basal cell   Stress fracture    Right Heel, Per [facility] New Patient Packet    TIA (transient ischemic attack) 2022   Vaso vagal episode    during preparation for colonoscopy    Past Surgical History:  Procedure Laterality Date   ABDOMINAL HYSTERECTOMY  1988   TAH/BSO--FIBROIDS  APPENDECTOMY     BREAST LUMPECTOMY     B/L--[facility]   BREAST LUMPECTOMY WITH RADIOACTIVE SEED AND SENTINEL LYMPH NODE BIOPSY Left 06/11/2015   Procedure: LEFT BREAST LUMPECTOMY WITH RADIOACTIVE SEED AND LEFT SENTINEL LYMPH NODE MAPPING;  Surgeon: Harriette Bouillon, MD;  Location: [facility] OR;  Service: General;  Laterality: Left;   CARDIOVERSION N/A 01/11/2022   Procedure: CARDIOVERSION;  Surgeon: Jodelle Red, MD;  Location: [facility] ENDOSCOPY;  Service: Cardiovascular;  Laterality: N/A;   CATARACT EXTRACTION  2015   COLONOSCOPY  2010   DG  BONE DENSITY ([facility] HX)     EYE SURGERY Bilateral    cataracts   fiberadenoma Bilateral 1978, 1980   GANGLION CYST  EXCISION     L  hand   Lipiflow procedure      Allergies  Allergen Reactions   Sulfa Antibiotics Other (See Comments)    As a child, became "rigid as a stick and not responsive"   Tape Other (See Comments)    Blisters, Please use "paper" tape only for short periods of time   Wound Dressing Adhesive Other (See Comments)    Blisters, Please use "paper" tape only for short periods of time, Blisters, Please use "paper" tape only for short periods of time   Glucosamine Forte [Nutritional Supplements] Rash   Latex Rash    Outpatient Encounter Medications as of 02/20/2023  Medication Sig   acetaminophen (TYLENOL) 500 MG tablet Take 1,000 mg by mouth as needed.   acetaminophen (TYLENOL) 500 MG tablet Take 1,000 mg by mouth every morning.   Biotin 5 MG CAPS Take 5 mg by mouth daily.    Calcium Carb-Cholecalciferol (CALCIUM 600+D3) 600-10 MG-MCG TABS Take 1 tablet by mouth in the morning.   Cholecalciferol (VITAMIN D3) 1000 UNITS CAPS Take 1,000 Units by mouth daily.   cycloSPORINE (RESTASIS) 0.05 % ophthalmic emulsion Place 1 drop into both eyes See admin instructions. Instill 1 drop into each eye two times a day, after applying a hot compress for 3-5 minutes beforehand   ELIQUIS 5 MG TABS tablet TAKE ONE TABLET BY MOUTH TWICE DAILY   escitalopram (LEXAPRO) 5 MG tablet Take 10 mg by mouth daily.   loperamide (IMODIUM A-D) 2 MG tablet Take 2 mg by mouth 3 (three) times daily as needed for diarrhea or loose stools.   metoprolol succinate (TOPROL XL) 25 MG 24 hr tablet Take 25 mg every morning   Multiple Vitamins-Minerals (MULTIVITAMIN WITH MINERALS) tablet Take 2 tablets by mouth daily with breakfast.   Omega-3 Fatty Acids (FISH OIL PO) Take 1 capsule by mouth daily after breakfast.   polyethylene glycol (MIRALAX / GLYCOLAX) 17 g packet Take 17 g by mouth in the morning. Mix 17 grams of powder into 4-6 ounces of water and drink every morning- may take an additional dose (17 grams) once a day as  needed for constipation   pravastatin (PRAVACHOL) 40 MG tablet TAKE ONE TABLET BY MOUTH ONCE DAILY   sodium chloride (MURO 128) 5 % ophthalmic ointment Place 1 Application into both eyes at bedtime.   Turmeric 500 MG CAPS Take 500 mg by mouth in the morning.   vitamin B-12 (CYANOCOBALAMIN) 1000 MCG tablet Take 1 tablet (1,000 mcg total) by mouth daily.   No facility-administered encounter medications on file as of 02/20/2023.    Review of Systems:  Review of Systems  Constitutional:  Negative for activity change, appetite change, chills, diaphoresis, fatigue, fever and unexpected weight change.  HENT:  Negative for congestion.  Respiratory:  Negative for cough, shortness of breath and wheezing.   Cardiovascular:  Negative for chest pain, palpitations and leg swelling.  Gastrointestinal:  Negative for abdominal distention, abdominal pain, constipation and diarrhea.  Genitourinary:  Negative for difficulty urinating and dysuria.  Musculoskeletal:  Positive for arthralgias, gait problem and joint swelling. Negative for back pain and myalgias.  Neurological:  Positive for dizziness. Negative for tremors, seizures, syncope, facial asymmetry, speech difficulty, weakness, light-headedness, numbness and headaches.  Psychiatric/Behavioral:  Negative for agitation, behavioral problems and confusion.        Memory loss    Health Maintenance  Topic Date Due   Medicare Annual Wellness (AWV)  11/13/2020   COVID-19 Vaccine (6 - 2023-24 season) 06/22/2022   INFLUENZA VACCINE  01/26/2023   MAMMOGRAM  07/12/2023   DTaP/Tdap/Td (5 - Td or Tdap) 10/24/2030   Pneumonia Vaccine 66+ Years old  Completed   DEXA SCAN  Completed   Zoster Vaccines- Shingrix  Completed   HPV VACCINES  Aged Out    Physical Exam: There were no vitals filed for this visit. There is no height or weight on file to calculate BMI. Physical Exam Vitals and nursing note reviewed.  Constitutional:      General: She is not in  acute distress.    Appearance: She is not diaphoretic.  HENT:     Head: Normocephalic and atraumatic.     Right Ear: Tympanic membrane normal.     Left Ear: Tympanic membrane normal.     Nose: Nose normal.     Mouth/Throat:     Mouth: Mucous membranes are moist.     Pharynx: Oropharynx is clear.  Eyes:     Extraocular Movements: Extraocular movements intact.     Conjunctiva/sclera: Conjunctivae normal.     Pupils: Pupils are equal, round, and reactive to light.  Neck:     Vascular: No JVD.  Cardiovascular:     Rate and Rhythm: Normal rate. Rhythm irregular.     Heart sounds: No murmur heard. Pulmonary:     Effort: Pulmonary effort is normal. No respiratory distress.     Breath sounds: Normal breath sounds. No wheezing.  Abdominal:     General: Bowel sounds are normal. There is no distension.     Palpations: Abdomen is soft.  Musculoskeletal:     Comments: Right middle finger DIP joint with pain and redness and swelling.  Trace ankle edema  Skin:    General: Skin is warm and dry.  Neurological:     General: No focal deficit present.     Mental Status: She is alert and oriented to person, place, and time. Mental status is at baseline.  Psychiatric:        Mood and Affect: Mood normal.     Labs reviewed: Basic Metabolic Panel: Recent Labs    07/07/22 0000 08/04/22 0000 12/04/22 1906 12/11/22 1937 12/12/22 0859 12/13/22 0411 01/04/23 0000 01/30/23 2123 01/30/23 2133  NA  --    < > 141   < > 139 138 141 137 138  K  --    < > 4.1   < > 3.8 3.4* 4.1 4.0 3.8  CL  --    < > 106   < > 101 102 104 102 103  CO2  --    < > 26   < > 25 24 25* 26  --   GLUCOSE  --   --  96   < > 88 98  --  125* 123*  BUN  --    < > 28*   < > 13 17 16  31* 32*  CREATININE  --    < > 1.06*   < > 0.77 0.92 0.8 1.03* 1.00  CALCIUM  --    < > 9.6   < > 9.2 8.8* 9.5 10.0  --   MG  --   --  2.5*  --   --   --   --   --   --   TSH 1.92  --   --   --  3.099  --   --   --   --    < > = values in  this interval not displayed.   Liver Function Tests: Recent Labs    12/04/22 1906 12/11/22 1937 01/30/23 2123  AST 28 22 28   ALT 27 18 18   ALKPHOS 59 64 55  BILITOT 1.1 0.9 1.2  PROT 6.2* 7.1 5.7*  ALBUMIN 3.8 4.2 3.5   No results for input(s): "LIPASE", "AMYLASE" in the last 8760 hours. Recent Labs    12/11/22 2043  AMMONIA 12   CBC: Recent Labs    12/04/22 1906 12/11/22 1937 12/12/22 0859 12/13/22 0411 01/30/23 2123 01/30/23 2133  WBC 8.5 9.1 10.3 8.4 8.8  --   NEUTROABS 5.8 5.9  --   --   --   --   HGB 15.3* 17.6* 17.6* 16.5* 14.9 15.3*  HCT 45.3 51.4* 50.6* 49.0* 43.8 45.0  MCV 92.1 91.8 90.4 90.1 90.7  --   PLT 191 217 181 208 208  --    Lipid Panel: Recent Labs    04/21/22 0000 08/04/22 0000  CHOL 122 152  HDL 58 63  LDLCALC 50 70  TRIG 66 91   Lab Results  Component Value Date   HGBA1C 4.8 05/11/2021    Procedures since last visit: CT HEAD WO CONTRAST  Result Date: 01/30/2023 CLINICAL DATA:  Trauma EXAM: CT HEAD WITHOUT CONTRAST CT MAXILLOFACIAL WITHOUT CONTRAST CT CERVICAL SPINE WITHOUT CONTRAST TECHNIQUE: Multidetector CT imaging of the head, cervical spine, and maxillofacial structures were performed using the standard protocol without intravenous contrast. Multiplanar CT image reconstructions of the cervical spine and maxillofacial structures were also generated. RADIATION DOSE REDUCTION: This exam was performed according to the departmental dose-optimization program which includes automated exposure control, adjustment of the mA and/or kV according to patient size and/or use of iterative reconstruction technique. COMPARISON:  12/11/2022 FINDINGS: CT HEAD FINDINGS Brain: Unchanged 3.0 cm mass at the right cervicomedullary junction with leftward deviation of the brainstem/upper cervical spinal cord. No acute hemorrhage or extra-axial collection. There is generalized atrophy without lobar predilection. There is hypoattenuation of the periventricular  white matter, most commonly indicating chronic ischemic microangiopathy. Vascular: Atherosclerotic calcification of the internal carotid arteries at the skull base. No abnormal hyperdensity of the major intracranial arteries or dural venous sinuses. Skull: The visualized skull base, calvarium and extracranial soft tissues are normal. CT MAXILLOFACIAL FINDINGS Osseous: No facial fracture or mandibular dislocation. Orbits: The globes are intact. Normal appearance of the intra- and extraconal fat. Symmetric extraocular muscles and optic nerves. Sinuses: No fluid levels or advanced mucosal thickening. Soft tissues: Normal visualized extracranial soft tissues. CT CERVICAL SPINE FINDINGS Alignment: No static subluxation. Facets are aligned. Occipital condyles and the lateral masses of C1-C2 are aligned. Skull base and vertebrae: No acute fracture. Soft tissues and spinal canal: No prevertebral fluid or swelling. No visible canal hematoma. Disc levels: No advanced  spinal canal or neural foraminal stenosis. Upper chest: No pneumothorax, pulmonary nodule or pleural effusion. Other: Normal visualized paraspinal cervical soft tissues. IMPRESSION: 1. No acute intracranial abnormality. 2. No acute fracture or static subluxation of the cervical spine. 3. No facial fracture. 4. Unchanged appearance of 3.0 cm mass (likely meningioma) at the right cervicomedullary junction with leftward deviation of the brainstem/upper cervical spinal cord. Electronically Signed   By: Deatra Robinson M.D.   On: 01/30/2023 22:24   CT MAXILLOFACIAL WO CONTRAST  Result Date: 01/30/2023 CLINICAL DATA:  Trauma EXAM: CT HEAD WITHOUT CONTRAST CT MAXILLOFACIAL WITHOUT CONTRAST CT CERVICAL SPINE WITHOUT CONTRAST TECHNIQUE: Multidetector CT imaging of the head, cervical spine, and maxillofacial structures were performed using the standard protocol without intravenous contrast. Multiplanar CT image reconstructions of the cervical spine and maxillofacial  structures were also generated. RADIATION DOSE REDUCTION: This exam was performed according to the departmental dose-optimization program which includes automated exposure control, adjustment of the mA and/or kV according to patient size and/or use of iterative reconstruction technique. COMPARISON:  12/11/2022 FINDINGS: CT HEAD FINDINGS Brain: Unchanged 3.0 cm mass at the right cervicomedullary junction with leftward deviation of the brainstem/upper cervical spinal cord. No acute hemorrhage or extra-axial collection. There is generalized atrophy without lobar predilection. There is hypoattenuation of the periventricular white matter, most commonly indicating chronic ischemic microangiopathy. Vascular: Atherosclerotic calcification of the internal carotid arteries at the skull base. No abnormal hyperdensity of the major intracranial arteries or dural venous sinuses. Skull: The visualized skull base, calvarium and extracranial soft tissues are normal. CT MAXILLOFACIAL FINDINGS Osseous: No facial fracture or mandibular dislocation. Orbits: The globes are intact. Normal appearance of the intra- and extraconal fat. Symmetric extraocular muscles and optic nerves. Sinuses: No fluid levels or advanced mucosal thickening. Soft tissues: Normal visualized extracranial soft tissues. CT CERVICAL SPINE FINDINGS Alignment: No static subluxation. Facets are aligned. Occipital condyles and the lateral masses of C1-C2 are aligned. Skull base and vertebrae: No acute fracture. Soft tissues and spinal canal: No prevertebral fluid or swelling. No visible canal hematoma. Disc levels: No advanced spinal canal or neural foraminal stenosis. Upper chest: No pneumothorax, pulmonary nodule or pleural effusion. Other: Normal visualized paraspinal cervical soft tissues. IMPRESSION: 1. No acute intracranial abnormality. 2. No acute fracture or static subluxation of the cervical spine. 3. No facial fracture. 4. Unchanged appearance of 3.0 cm mass  (likely meningioma) at the right cervicomedullary junction with leftward deviation of the brainstem/upper cervical spinal cord. Electronically Signed   By: Deatra Robinson M.D.   On: 01/30/2023 22:24   CT CERVICAL SPINE WO CONTRAST  Result Date: 01/30/2023 CLINICAL DATA:  Trauma EXAM: CT HEAD WITHOUT CONTRAST CT MAXILLOFACIAL WITHOUT CONTRAST CT CERVICAL SPINE WITHOUT CONTRAST TECHNIQUE: Multidetector CT imaging of the head, cervical spine, and maxillofacial structures were performed using the standard protocol without intravenous contrast. Multiplanar CT image reconstructions of the cervical spine and maxillofacial structures were also generated. RADIATION DOSE REDUCTION: This exam was performed according to the departmental dose-optimization program which includes automated exposure control, adjustment of the mA and/or kV according to patient size and/or use of iterative reconstruction technique. COMPARISON:  12/11/2022 FINDINGS: CT HEAD FINDINGS Brain: Unchanged 3.0 cm mass at the right cervicomedullary junction with leftward deviation of the brainstem/upper cervical spinal cord. No acute hemorrhage or extra-axial collection. There is generalized atrophy without lobar predilection. There is hypoattenuation of the periventricular white matter, most commonly indicating chronic ischemic microangiopathy. Vascular: Atherosclerotic calcification of the internal carotid arteries at the skull  base. No abnormal hyperdensity of the major intracranial arteries or dural venous sinuses. Skull: The visualized skull base, calvarium and extracranial soft tissues are normal. CT MAXILLOFACIAL FINDINGS Osseous: No facial fracture or mandibular dislocation. Orbits: The globes are intact. Normal appearance of the intra- and extraconal fat. Symmetric extraocular muscles and optic nerves. Sinuses: No fluid levels or advanced mucosal thickening. Soft tissues: Normal visualized extracranial soft tissues. CT CERVICAL SPINE FINDINGS  Alignment: No static subluxation. Facets are aligned. Occipital condyles and the lateral masses of C1-C2 are aligned. Skull base and vertebrae: No acute fracture. Soft tissues and spinal canal: No prevertebral fluid or swelling. No visible canal hematoma. Disc levels: No advanced spinal canal or neural foraminal stenosis. Upper chest: No pneumothorax, pulmonary nodule or pleural effusion. Other: Normal visualized paraspinal cervical soft tissues. IMPRESSION: 1. No acute intracranial abnormality. 2. No acute fracture or static subluxation of the cervical spine. 3. No facial fracture. 4. Unchanged appearance of 3.0 cm mass (likely meningioma) at the right cervicomedullary junction with leftward deviation of the brainstem/upper cervical spinal cord. Electronically Signed   By: Deatra Robinson M.D.   On: 01/30/2023 22:24   DG Pelvis Portable  Result Date: 01/30/2023 CLINICAL DATA:  Status post fall. EXAM: PORTABLE PELVIS 1-2 VIEWS COMPARISON:  None Available. FINDINGS: There is no evidence of pelvic fracture or diastasis. No pelvic bone lesions are seen. Marked severity degenerative changes are seen within the visualized portion of the lower lumbar spine. Multiple radiopaque surgical clips are seen overlying the pelvis. IMPRESSION: 1. No acute osseous abnormality. Electronically Signed   By: Aram Candela M.D.   On: 01/30/2023 21:42   DG Chest Port 1 View  Result Date: 01/30/2023 CLINICAL DATA:  Status post fall. EXAM: PORTABLE CHEST 1 VIEW COMPARISON:  [place] 16, 2024 FINDINGS: The cardiac silhouette is mildly enlarged and unchanged in size. There is moderate severity calcification of the aortic arch. Low lung volumes are noted, without evidence of acute infiltrate, pleural effusion or pneumothorax. Radiopaque surgical clips are seen within the soft tissues of the left breast. No acute osseous abnormalities are identified. IMPRESSION: Low lung volumes without acute or active cardiopulmonary disease.  Electronically Signed   By: Aram Candela M.D.   On: 01/30/2023 21:39    Assessment/Plan  1. Pain of finger of right hand Note to DIP joint  Prednisone 20 mg daily for 5 days Monitor report if not improving.   2. Memory loss MMSE 22/30 Issues of recall and serial 7s Will order ST eval for cognition   3. Dizziness Improving Needs walker at all times BP ok Like due to meningioma per notes   4. Meningioma (HCC) Slight increase in size possibly pushing on brain stem per notes from Dr Terrace Arabia Not a surgical candidate   5. Paroxysmal atrial fibrillation (HCC) Rate is controlled on metoprolol  ON DOAC for CVA risk reduction   6. Right thyroid nodule TSH ok Found on CT incidentally Will hold off on imaging for right now.   7. Essential hypertension Controlled   8. Pure hypercholesterolemia Lab Results  Component Value Date   LDLCALC 70 08/04/2022   Continue pravachol     Labs/tests ordered:  * No order type specified * BMP prior to apt Next appt: 4 months with Dr Chales Abrahams    Total time   time greater than 50% of total time spent doing pt counseling and coordination of care

## 2023-02-20 ENCOUNTER — Encounter: Payer: Self-pay | Admitting: Adult Health

## 2023-02-20 ENCOUNTER — Non-Acute Institutional Stay: Payer: Medicare PPO | Admitting: Adult Health

## 2023-02-20 VITALS — BP 132/72 | HR 88 | Temp 97.8°F | Resp 17 | Ht 61.0 in | Wt 137.0 lb

## 2023-02-20 DIAGNOSIS — D329 Benign neoplasm of meninges, unspecified: Secondary | ICD-10-CM | POA: Diagnosis not present

## 2023-02-20 DIAGNOSIS — R269 Unspecified abnormalities of gait and mobility: Secondary | ICD-10-CM

## 2023-02-20 DIAGNOSIS — F32A Depression, unspecified: Secondary | ICD-10-CM | POA: Diagnosis not present

## 2023-02-20 DIAGNOSIS — R413 Other amnesia: Secondary | ICD-10-CM | POA: Diagnosis not present

## 2023-02-20 DIAGNOSIS — E7800 Pure hypercholesterolemia, unspecified: Secondary | ICD-10-CM | POA: Diagnosis not present

## 2023-02-20 DIAGNOSIS — R296 Repeated falls: Secondary | ICD-10-CM | POA: Diagnosis not present

## 2023-02-20 DIAGNOSIS — G8929 Other chronic pain: Secondary | ICD-10-CM

## 2023-02-20 DIAGNOSIS — I10 Essential (primary) hypertension: Secondary | ICD-10-CM

## 2023-02-20 DIAGNOSIS — M5459 Other low back pain: Secondary | ICD-10-CM | POA: Diagnosis not present

## 2023-02-20 DIAGNOSIS — M6259 Muscle wasting and atrophy, not elsewhere classified, multiple sites: Secondary | ICD-10-CM | POA: Diagnosis not present

## 2023-02-20 DIAGNOSIS — R278 Other lack of coordination: Secondary | ICD-10-CM | POA: Diagnosis not present

## 2023-02-20 DIAGNOSIS — M545 Low back pain, unspecified: Secondary | ICD-10-CM

## 2023-02-20 DIAGNOSIS — R42 Dizziness and giddiness: Secondary | ICD-10-CM

## 2023-02-20 DIAGNOSIS — R2689 Other abnormalities of gait and mobility: Secondary | ICD-10-CM | POA: Diagnosis not present

## 2023-02-20 DIAGNOSIS — M6389 Disorders of muscle in diseases classified elsewhere, multiple sites: Secondary | ICD-10-CM | POA: Diagnosis not present

## 2023-02-20 MED ORDER — DENOSUMAB 60 MG/ML ~~LOC~~ SOSY: 60.0000 mg | PREFILLED_SYRINGE | SUBCUTANEOUS | Status: DC

## 2023-02-20 NOTE — Patient Instructions (Signed)
Covid and flu vaccine recommended this fall  Keep getting prolia every 6 months  F/U with ophthalmology

## 2023-02-21 DIAGNOSIS — R278 Other lack of coordination: Secondary | ICD-10-CM | POA: Diagnosis not present

## 2023-02-21 DIAGNOSIS — R296 Repeated falls: Secondary | ICD-10-CM | POA: Diagnosis not present

## 2023-02-21 DIAGNOSIS — M5459 Other low back pain: Secondary | ICD-10-CM | POA: Diagnosis not present

## 2023-02-21 DIAGNOSIS — M6259 Muscle wasting and atrophy, not elsewhere classified, multiple sites: Secondary | ICD-10-CM | POA: Diagnosis not present

## 2023-02-21 DIAGNOSIS — R2689 Other abnormalities of gait and mobility: Secondary | ICD-10-CM | POA: Diagnosis not present

## 2023-02-21 DIAGNOSIS — M6389 Disorders of muscle in diseases classified elsewhere, multiple sites: Secondary | ICD-10-CM | POA: Diagnosis not present

## 2023-02-22 DIAGNOSIS — R2689 Other abnormalities of gait and mobility: Secondary | ICD-10-CM | POA: Diagnosis not present

## 2023-02-22 DIAGNOSIS — M6259 Muscle wasting and atrophy, not elsewhere classified, multiple sites: Secondary | ICD-10-CM | POA: Diagnosis not present

## 2023-02-22 DIAGNOSIS — R278 Other lack of coordination: Secondary | ICD-10-CM | POA: Diagnosis not present

## 2023-02-22 DIAGNOSIS — Z8673 Personal history of transient ischemic attack (TIA), and cerebral infarction without residual deficits: Secondary | ICD-10-CM | POA: Diagnosis not present

## 2023-02-22 DIAGNOSIS — M5459 Other low back pain: Secondary | ICD-10-CM | POA: Diagnosis not present

## 2023-02-22 DIAGNOSIS — R41841 Cognitive communication deficit: Secondary | ICD-10-CM | POA: Diagnosis not present

## 2023-02-22 DIAGNOSIS — R296 Repeated falls: Secondary | ICD-10-CM | POA: Diagnosis not present

## 2023-02-22 DIAGNOSIS — M6389 Disorders of muscle in diseases classified elsewhere, multiple sites: Secondary | ICD-10-CM | POA: Diagnosis not present

## 2023-02-23 ENCOUNTER — Ambulatory Visit: Payer: Medicare PPO

## 2023-02-23 ENCOUNTER — Emergency Department (HOSPITAL_COMMUNITY): Payer: Medicare PPO

## 2023-02-23 ENCOUNTER — Emergency Department (HOSPITAL_COMMUNITY)
Admission: EM | Admit: 2023-02-23 | Discharge: 2023-02-23 | Disposition: A | Discharge status: Home / Self Care | Payer: Medicare PPO | Attending: Emergency Medicine | Admitting: Emergency Medicine

## 2023-02-23 ENCOUNTER — Other Ambulatory Visit: Payer: Self-pay

## 2023-02-23 ENCOUNTER — Encounter (HOSPITAL_COMMUNITY): Payer: Self-pay

## 2023-02-23 DIAGNOSIS — W1830XA Fall on same level, unspecified, initial encounter: Secondary | ICD-10-CM | POA: Diagnosis not present

## 2023-02-23 DIAGNOSIS — D320 Benign neoplasm of cerebral meninges: Secondary | ICD-10-CM | POA: Diagnosis not present

## 2023-02-23 DIAGNOSIS — I3481 Nonrheumatic mitral (valve) annulus calcification: Secondary | ICD-10-CM | POA: Diagnosis not present

## 2023-02-23 DIAGNOSIS — M160 Bilateral primary osteoarthritis of hip: Secondary | ICD-10-CM | POA: Diagnosis not present

## 2023-02-23 DIAGNOSIS — M47816 Spondylosis without myelopathy or radiculopathy, lumbar region: Secondary | ICD-10-CM | POA: Diagnosis not present

## 2023-02-23 DIAGNOSIS — S0990XA Unspecified injury of head, initial encounter: Secondary | ICD-10-CM | POA: Diagnosis not present

## 2023-02-23 DIAGNOSIS — R410 Disorientation, unspecified: Secondary | ICD-10-CM | POA: Diagnosis present

## 2023-02-23 DIAGNOSIS — C719 Malignant neoplasm of brain, unspecified: Secondary | ICD-10-CM | POA: Diagnosis not present

## 2023-02-23 DIAGNOSIS — S20211A Contusion of right front wall of thorax, initial encounter: Secondary | ICD-10-CM | POA: Insufficient documentation

## 2023-02-23 DIAGNOSIS — S299XXA Unspecified injury of thorax, initial encounter: Secondary | ICD-10-CM | POA: Diagnosis not present

## 2023-02-23 DIAGNOSIS — Z043 Encounter for examination and observation following other accident: Secondary | ICD-10-CM | POA: Diagnosis not present

## 2023-02-23 DIAGNOSIS — S20219A Contusion of unspecified front wall of thorax, initial encounter: Secondary | ICD-10-CM | POA: Diagnosis not present

## 2023-02-23 DIAGNOSIS — I700 Atherosclerosis of aorta: Secondary | ICD-10-CM | POA: Diagnosis not present

## 2023-02-23 DIAGNOSIS — W19XXXA Unspecified fall, initial encounter: Secondary | ICD-10-CM

## 2023-02-23 DIAGNOSIS — R220 Localized swelling, mass and lump, head: Secondary | ICD-10-CM | POA: Diagnosis not present

## 2023-02-23 LAB — CBC WITH DIFFERENTIAL/PLATELET
Abs Immature Granulocytes: 0.03 10*3/uL (ref 0.00–0.07)
Basophils Absolute: 0 10*3/uL (ref 0.0–0.1)
Basophils Relative: 1 %
Eosinophils Absolute: 0.1 10*3/uL (ref 0.0–0.5)
Eosinophils Relative: 1 %
HCT: 45.7 % (ref 36.0–46.0)
Hemoglobin: 15.4 g/dL — ABNORMAL HIGH (ref 12.0–15.0)
Immature Granulocytes: 0 %
Lymphocytes Relative: 15 %
Lymphs Abs: 1.3 10*3/uL (ref 0.7–4.0)
MCH: 30.6 pg (ref 26.0–34.0)
MCHC: 33.7 g/dL (ref 30.0–36.0)
MCV: 90.9 fL (ref 80.0–100.0)
Monocytes Absolute: 0.7 10*3/uL (ref 0.1–1.0)
Monocytes Relative: 8 %
Neutro Abs: 6.5 10*3/uL (ref 1.7–7.7)
Neutrophils Relative %: 75 %
Platelets: 208 10*3/uL (ref 150–400)
RBC: 5.03 MIL/uL (ref 3.87–5.11)
RDW: 14.7 % (ref 11.5–15.5)
WBC: 8.6 10*3/uL (ref 4.0–10.5)
nRBC: 0 % (ref 0.0–0.2)

## 2023-02-23 LAB — COMPREHENSIVE METABOLIC PANEL
ALT: 15 U/L (ref 0–44)
AST: 24 U/L (ref 15–41)
Albumin: 3.5 g/dL (ref 3.5–5.0)
Alkaline Phosphatase: 50 U/L (ref 38–126)
Anion gap: 8 (ref 5–15)
BUN: 15 mg/dL (ref 8–23)
CO2: 31 mmol/L (ref 22–32)
Calcium: 9.8 mg/dL (ref 8.9–10.3)
Chloride: 101 mmol/L (ref 98–111)
Creatinine, Ser: 0.94 mg/dL (ref 0.44–1.00)
GFR, Estimated: 58 mL/min — ABNORMAL LOW (ref >60)
Glucose, Bld: 76 mg/dL (ref 70–99)
Potassium: 3.7 mmol/L (ref 3.5–5.1)
Sodium: 140 mmol/L (ref 135–145)
Total Bilirubin: 1.3 mg/dL — ABNORMAL HIGH (ref 0.3–1.2)
Total Protein: 5.8 g/dL — ABNORMAL LOW (ref 6.5–8.1)

## 2023-02-23 MED ORDER — SODIUM CHLORIDE 0.9 % IV BOLUS
1000.0000 mL | Freq: Once | INTRAVENOUS | Status: AC
  Administered 2023-02-23: 1000 mL via INTRAVENOUS

## 2023-02-23 NOTE — ED Triage Notes (Signed)
PT [facility] for a unwitnessed fall, no LOC. Per patient she has a brain tumor and has had periods of confusion, but per staff, she is at her base line.    157/94 94% CBG 173

## 2023-02-23 NOTE — Progress Notes (Signed)
Orthopedic Tech Progress Note Patient Details:  Jessica Mullins [birth year] [id number]  Level 2 trauma, ortho techs not needed at this time.  Patient ID: [name], female   DOB: [birth year], 89 y.o.   MRN: [id number]  Docia Furl 02/23/2023, 10:43 AM

## 2023-02-23 NOTE — Progress Notes (Signed)
Pt gone for Seabron Spates, [facility], Pager [phone]

## 2023-02-23 NOTE — ED Notes (Signed)
Xray in the room.

## 2023-02-23 NOTE — Discharge Instructions (Signed)
No obvious bleeding inside your skull on CT here.  Please follow up with your family doctor in the office.

## 2023-02-23 NOTE — ED Provider Notes (Signed)
[place] EMERGENCY DEPARTMENT AT [facility] Provider Note   CSN: [id number] Arrival date & time: 02/23/23  0940     History  Chief Complaint  Patient presents with   Fall    Level 2    Jessica Mullins is a 89 y.o. female.  89 yo F with a chief complaint of fall.  Patient is demented at baseline and is not sure exactly what happened.  She said she was walking that also she was not.  Complaining of pain mostly to her pride.  She denies any specific injury and thinks maybe her back hurts but has trouble knowing if it is hurting her now or if it was earlier today.    Fall       Home Medications Prior to Admission medications   Medication Sig Start Date End Date Taking? Authorizing Provider  acetaminophen (TYLENOL) 500 MG tablet Take 1,000 mg by mouth in the morning.   Yes [provider]  acetaminophen (TYLENOL) 500 MG tablet Take 1,000 mg by mouth 2 (two) times daily as needed for moderate pain.   Yes [provider]  Biotin 5 MG CAPS Take 5 mg by mouth in the morning.   Yes [provider]  Calcium Carb-Cholecalciferol (CALCIUM 600+D3) 600-10 MG-MCG TABS Take 1 tablet by mouth in the morning.   Yes [provider]  Cholecalciferol (VITAMIN D3) 1000 UNITS CAPS Take 1,000 Units by mouth in the morning.   Yes [provider]  cycloSPORINE (RESTASIS) 0.05 % ophthalmic emulsion Place 1 drop into both eyes 2 (two) times daily.  after applying a hot compress 10/11/16  Yes [provider]  denosumab (PROLIA) 60 MG/ML SOSY injection Inject 60 mg into the skin every 6 (six) months. Patient taking differently: Inject 60 mg into the skin. 02/20/23  Yes Wert, Trula Ore, NP  ELIQUIS 5 MG TABS tablet TAKE ONE TABLET BY MOUTH TWICE DAILY 11/19/21  Yes Causey, Larna Daughters, NP  escitalopram (LEXAPRO) 5 MG tablet Take 10 mg by mouth in the morning.   Yes [provider]  loperamide (IMODIUM A-D) 2 MG tablet Take 2 mg by  mouth 3 (three) times daily as needed for diarrhea or loose stools.   Yes [provider]  metoprolol succinate (TOPROL XL) 25 MG 24 hr tablet Take 25 mg every morning 06/08/22  Yes Croitoru, Mihai, MD  Multiple Vitamins-Minerals (MULTIVITAMIN WITH MINERALS) tablet Take 2 tablets by mouth in the morning.   Yes [provider]  NON FORMULARY Place 1 Application into both eyes in the morning and at bedtime. Hot compress to eyelids for 3-5 minutes twice daily   Yes [provider]  Omega-3 Fatty Acids (FISH OIL PO) Take 1 capsule by mouth in the morning. 350 mg-235 mg-90 mg-597 mg   Yes [provider]  polyethylene glycol (MIRALAX / GLYCOLAX) 17 g packet Take 17 g by mouth in the morning.   Yes [provider]  pravastatin (PRAVACHOL) 40 MG tablet TAKE ONE TABLET BY MOUTH ONCE DAILY Patient taking differently: Take 40 mg by mouth every evening. 08/05/22  Yes Mahlon Gammon, MD  sodium chloride (MURO 128) 5 % ophthalmic ointment Place 1 Application into both eyes at bedtime.   Yes [provider]  Turmeric 500 MG CAPS Take 500 mg by mouth in the morning.   Yes [provider]  vitamin B-12 (CYANOCOBALAMIN) 1000 MCG tablet Take 1 tablet (1,000 mcg total) by mouth daily. 07/19/18  Yes  Serena Croissant, MD      Allergies    Sulfa antibiotics, Tape, Wound dressing adhesive, Glucosamine forte [nutritional supplements], and Latex    Review of Systems   Review of Systems  Physical Exam Updated Vital Signs BP (!) 162/114   Pulse 81   Temp 97.8 F (36.6 C)   Resp 12   Ht 5\' 1"  (1.549 m)   Wt 60.8 kg   SpO2 100%   BMI 25.32 kg/m  Physical Exam Vitals and nursing note reviewed.  Constitutional:      General: She is not in acute distress.    Appearance: She is well-developed. She is not diaphoretic.  HENT:     Head: Normocephalic and atraumatic.  Eyes:     Pupils: Pupils are equal, round, and reactive to light.  Cardiovascular:      Rate and Rhythm: Normal rate and regular rhythm.     Heart sounds: No murmur heard.    No friction rub. No gallop.     Comments: Patient has a bruise to the right posterior rib angles.  No obvious crepitus or tenderness on palpation. Pulmonary:     Effort: Pulmonary effort is normal.     Breath sounds: No wheezing or rales.  Abdominal:     General: There is no distension.     Palpations: Abdomen is soft.     Tenderness: There is no abdominal tenderness.  Musculoskeletal:        General: No tenderness.     Cervical back: Normal range of motion and neck supple.     Comments: Patient was palpated from head to toe without any obvious noted areas of bony tenderness.  Skin:    General: Skin is warm and dry.  Neurological:     Mental Status: She is alert and oriented to person, place, and time.  Psychiatric:        Behavior: Behavior normal.     ED Results / Procedures / Treatments   Labs (all labs ordered are listed, but only abnormal results are displayed) Labs Reviewed  CBC WITH DIFFERENTIAL/PLATELET - Abnormal; Notable for the following components:      Result Value   Hemoglobin 15.4 (*)    All other components within normal limits  COMPREHENSIVE METABOLIC PANEL - Abnormal; Notable for the following components:   Total Protein 5.8 (*)    Total Bilirubin 1.3 (*)    GFR, Estimated 58 (*)    All other components within normal limits    EKG None  Radiology CT Head Wo Contrast  Result Date: 02/23/2023 CLINICAL DATA:  Unwitnessed fall, head injury, brain tumor EXAM: CT HEAD WITHOUT CONTRAST CT CERVICAL SPINE WITHOUT CONTRAST TECHNIQUE: Multidetector CT imaging of the head and cervical spine was performed following the standard protocol without intravenous contrast. Multiplanar CT image reconstructions of the cervical spine were also generated. RADIATION DOSE REDUCTION: This exam was performed according to the departmental dose-optimization program which includes automated exposure  control, adjustment of the mA and/or kV according to patient size and/or use of iterative reconstruction technique. COMPARISON:  01/30/2023 FINDINGS: CT HEAD FINDINGS Brain: No evidence of acute infarction, hemorrhage, or hydrocephalus. Unchanged extra-axial mass of the anterior posterior fossa appearing to arise from the clivus measuring 3.1 x 2.7 cm (series 3, image 4). Periventricular and deep white matter hypodensity. Vascular: No hyperdense vessel or unexpected calcification. Skull: Normal. Negative for fracture or focal lesion. Sinuses/Orbits: No acute finding. Other: None. CT CERVICAL SPINE FINDINGS Alignment: Normal. Skull base and  vertebrae: No acute fracture. No primary bone lesion or focal pathologic process. Soft tissues and spinal canal: No prevertebral fluid or swelling. No visible canal hematoma. Disc levels: Moderate multilevel disc space height loss and osteophytosis throughout the cervical spine. Upper chest: Negative. Other: None. IMPRESSION: 1. No acute intracranial pathology. 2. Unchanged extra-axial mass of the anterior posterior fossa appearing to arise from the clivus measuring 3.1 x 2.7 cm, previously characterized as a meningioma. 3. No fracture or static subluxation of the cervical spine. 4. Moderate multilevel cervical disc degenerative disease. Electronically Signed   By: Jearld Lesch M.D.   On: 02/23/2023 11:42   CT Cervical Spine Wo Contrast  Result Date: 02/23/2023 CLINICAL DATA:  Unwitnessed fall, head injury, brain tumor EXAM: CT HEAD WITHOUT CONTRAST CT CERVICAL SPINE WITHOUT CONTRAST TECHNIQUE: Multidetector CT imaging of the head and cervical spine was performed following the standard protocol without intravenous contrast. Multiplanar CT image reconstructions of the cervical spine were also generated. RADIATION DOSE REDUCTION: This exam was performed according to the departmental dose-optimization program which includes automated exposure control, adjustment of the mA and/or  kV according to patient size and/or use of iterative reconstruction technique. COMPARISON:  01/30/2023 FINDINGS: CT HEAD FINDINGS Brain: No evidence of acute infarction, hemorrhage, or hydrocephalus. Unchanged extra-axial mass of the anterior posterior fossa appearing to arise from the clivus measuring 3.1 x 2.7 cm (series 3, image 4). Periventricular and deep white matter hypodensity. Vascular: No hyperdense vessel or unexpected calcification. Skull: Normal. Negative for fracture or focal lesion. Sinuses/Orbits: No acute finding. Other: None. CT CERVICAL SPINE FINDINGS Alignment: Normal. Skull base and vertebrae: No acute fracture. No primary bone lesion or focal pathologic process. Soft tissues and spinal canal: No prevertebral fluid or swelling. No visible canal hematoma. Disc levels: Moderate multilevel disc space height loss and osteophytosis throughout the cervical spine. Upper chest: Negative. Other: None. IMPRESSION: 1. No acute intracranial pathology. 2. Unchanged extra-axial mass of the anterior posterior fossa appearing to arise from the clivus measuring 3.1 x 2.7 cm, previously characterized as a meningioma. 3. No fracture or static subluxation of the cervical spine. 4. Moderate multilevel cervical disc degenerative disease. Electronically Signed   By: Jearld Lesch M.D.   On: 02/23/2023 11:42   DG Pelvis Portable  Result Date: 02/23/2023 CLINICAL DATA:  Unwitnessed fall. EXAM: PORTABLE PELVIS 1-2 VIEWS COMPARISON:  01/30/2023. FINDINGS: Pelvis is intact with normal and symmetric sacroiliac joints. No acute fracture or dislocation. No aggressive osseous lesion. Visualized sacral arcuate lines are unremarkable. Unremarkable symphysis pubis. There are mild degenerative changes of bilateral hip joints. Moderate-to-severe degenerative changes of the visualized lower lumbar spine. No radiopaque foreign bodies. There are surgical staples overlying the pelvis. IMPRESSION: 1. No acute osseous abnormality.   Degenerative joint/disc disease. Electronically Signed   By: Jules Schick M.D.   On: 02/23/2023 10:42   DG Chest Port 1 View  Result Date: 02/23/2023 CLINICAL DATA:  trauma.  Unwitnessed fall. EXAM: PORTABLE CHEST 1 VIEW COMPARISON:  01/30/2023. FINDINGS: Bilateral lung fields are clear. Bilateral costophrenic angles are clear. Stable cardio-mediastinal silhouette. Aortic arch calcifications and dense mitral annulus calcifications are again seen. No acute osseous abnormalities. The soft tissues are within normal limits. Surgical staples are noted overlying the left breast/axillary region. IMPRESSION: 1. No active disease. 2. Aortic atherosclerosis. 3. Mitral annulus calcifications. Electronically Signed   By: Jules Schick M.D.   On: 02/23/2023 10:40    Procedures Procedures    Medications Ordered in ED Medications  sodium  chloride 0.9 % bolus 1,000 mL (1,000 mLs Intravenous New Bag/Given 02/23/23 1039)    ED Course/ Medical Decision Making/ A&P                                 Medical Decision Making Amount and/or Complexity of Data Reviewed Labs: ordered. Radiology: ordered.   89 yo F with a chief complaint of a fall from standing.  Patient has trouble telling us exactly what happened it was reported as a nonsyncopal fall by the nursing facility.  She also does not know what hurts or not so she said maybe her back hurts but is not sure when that happened or if it actually hurts now.  She does have signs of a bruise to the right side of her back.  Plain film of the chest independently interpreted by me without obvious displaced rib fracture.  No pneumothorax.  CT of the head and C-spine were negative.  Blood work obtained without obvious electrolyte abnormality, no anemia.  12:27 PM:  I have discussed the diagnosis/risks/treatment options with the patient.  Evaluation and diagnostic testing in the emergency department does not suggest an emergent condition requiring admission or  immediate intervention beyond what has been performed at this time.  They will follow up with PCP. We also discussed returning to the ED immediately if new or worsening sx occur. We discussed the sx which are most concerning (e.g., sudden worsening pain, fever, inability to tolerate by mouth) that necessitate immediate return. Medications administered to the patient during their visit and any new prescriptions provided to the patient are listed below.  Medications given during this visit Medications  sodium chloride 0.9 % bolus 1,000 mL (1,000 mLs Intravenous New Bag/Given 02/23/23 1039)     The patient appears reasonably screen and/or stabilized for discharge and I doubt any other medical condition or other [facility] requiring further screening, evaluation, or treatment in the ED at this time prior to discharge.          Final Clinical Impression(s) / ED Diagnoses Final diagnoses:  Fall, initial encounter    Rx / DC Orders ED Discharge Orders     None         Melene Plan, DO 02/23/23 1227

## 2023-02-24 ENCOUNTER — Non-Acute Institutional Stay (SKILLED_NURSING_FACILITY): Payer: Medicare PPO | Admitting: Adult Health

## 2023-02-24 DIAGNOSIS — E7800 Pure hypercholesterolemia, unspecified: Secondary | ICD-10-CM

## 2023-02-24 DIAGNOSIS — R2689 Other abnormalities of gait and mobility: Secondary | ICD-10-CM | POA: Diagnosis not present

## 2023-02-24 DIAGNOSIS — I480 Paroxysmal atrial fibrillation: Secondary | ICD-10-CM | POA: Diagnosis not present

## 2023-02-24 DIAGNOSIS — R278 Other lack of coordination: Secondary | ICD-10-CM | POA: Diagnosis not present

## 2023-02-24 DIAGNOSIS — R296 Repeated falls: Secondary | ICD-10-CM | POA: Diagnosis not present

## 2023-02-24 DIAGNOSIS — I10 Essential (primary) hypertension: Secondary | ICD-10-CM | POA: Diagnosis not present

## 2023-02-24 DIAGNOSIS — R269 Unspecified abnormalities of gait and mobility: Secondary | ICD-10-CM | POA: Diagnosis not present

## 2023-02-24 DIAGNOSIS — Z853 Personal history of malignant neoplasm of breast: Secondary | ICD-10-CM

## 2023-02-24 DIAGNOSIS — M5459 Other low back pain: Secondary | ICD-10-CM | POA: Diagnosis not present

## 2023-02-24 DIAGNOSIS — D329 Benign neoplasm of meninges, unspecified: Secondary | ICD-10-CM | POA: Diagnosis not present

## 2023-02-24 DIAGNOSIS — M6259 Muscle wasting and atrophy, not elsewhere classified, multiple sites: Secondary | ICD-10-CM | POA: Diagnosis not present

## 2023-02-24 DIAGNOSIS — M816 Localized osteoporosis [Lequesne]: Secondary | ICD-10-CM

## 2023-02-24 DIAGNOSIS — M6389 Disorders of muscle in diseases classified elsewhere, multiple sites: Secondary | ICD-10-CM | POA: Diagnosis not present

## 2023-02-25 ENCOUNTER — Encounter: Payer: Self-pay | Admitting: Adult Health

## 2023-02-25 NOTE — Progress Notes (Signed)
Location:  Medical illustrator of Service:  SNF (31) Provider:   Peggye Ley, ANP [facility] [phone]   Mahlon Gammon, MD  Patient Care Team: Mahlon Gammon, MD as PCP - General (Internal Medicine) Thurmon Fair, MD as PCP - Cardiology (Cardiology) Nita Sells, MD as Consulting Physician (Dermatology) Nelson Chimes, MD as Consulting Physician (Ophthalmology) Drema Dallas, DO as Consulting Physician (Neurology) Axel Filler Larna Daughters, NP as Nurse Practitioner (Hematology and Oncology)  Extended Emergency Contact Information Primary Emergency Contact: Ray,Ruth  [place] of [place] Home Phone: [phone] Mobile Phone: [phone] Relation: Daughter Secondary Emergency Contact: Corliss Marcus [place] of [place] Home Phone: [phone] Relation: Son  Code Status:  DNR Goals of care: Advanced Directive information    02/23/2023    9:50 AM  Advanced Directives  Does Patient Have a Medical Advance Directive? Yes     Chief Complaint  Patient presents with   Acute Visit    F/u ER Visit for a fall.     HPI:  Pt is a 89 y.o. female seen today for a follow up after an ER visit due to a fall  PMH significant for CVA, afib, HTN, gait abnormality, HLD, orthostatic hypotension, bradycardia, lumbar radiculitis, meningioma, osteopenia, and breast cancer.   From [place], retired Runner, broadcasting/film/video  She has been seen in the ED 4 times in the past year.  ON 8/29 she was sent to the ER. She is not sure how she fell. She hit her head and due to being on Eliquis she was sent to the ER> CT of head and neck showed no acute changes. Due to her meningioma and progressive memory loss she is having more falls. No acute neuro changes at this time. BP was elevated last evening, hydralazine given and bp improved.   She is able to walk with a walker, at times needs assistance 1:1. Needs cuing and reminders.   Feels her vision is  worsening Blurry vision, no double vision. Followed by ophthalmology.   Dr Everlena Cooper is her neurologist. He saw her in [place] 12 she was having leg paresthesia. MRI recommended  MRI 12/12/22 showed increasing size of the meningioma. No surgical intervention recommended.  Memory loss: worsening over time.  Did poorly in serial 7s and recall on MMSE 22/30 08/21/22 Had trouble spelling world backwards.   Had cardioversion for afib 01/11/22 Had some bradycardia which did improve off cardizem 2nd degree AV block on zio patch July 2023 Afib controlled on Eliquis follows with cardiology on metoprolol  Mammogram 07/11/22 normal  Dexa scan 07/21/21 t score -1.1 on Prolia. Last injection 08/22/22.    Meningioma: MRI of the brain 11/04/21 showed stable appearance.   follows with neurosurgery   H/o left Breast Cancer Invasive Ductal Carcinoma diagnosed in 2016 S/p lumpectomy, chemo, radiation Past Medical History:  Diagnosis Date   Allergy    Ankle fracture, left    Atrial fibrillation (HCC)    Per [facility] New Patient Packet    Breast cancer (HCC) 1115/16   left    Bursitis of right shoulder    Cataract    Family history of cancer    Fracture of right wrist    GERD (gastroesophageal reflux disease)    Hyperlipidemia    Hypertension    Lumbar radiculitis    Per [facility] New Patient Packet    Meningioma [facility])    Per [facility] New Patient Packet    Skin cancer 2000   melanoma and  basal cell   Stress fracture    Right Heel, Per [facility] New Patient Packet    TIA (transient ischemic attack) 2022   Vaso vagal episode    during preparation for colonoscopy   Past Surgical History:  Procedure Laterality Date   ABDOMINAL HYSTERECTOMY  1988   TAH/BSO--FIBROIDS   APPENDECTOMY     BREAST LUMPECTOMY     B/L--[facility]   BREAST LUMPECTOMY WITH RADIOACTIVE SEED AND SENTINEL LYMPH NODE BIOPSY Left 06/11/2015   Procedure: LEFT BREAST LUMPECTOMY WITH RADIOACTIVE SEED AND LEFT SENTINEL LYMPH NODE MAPPING;  Surgeon: Harriette Bouillon, MD;  Location: [facility] OR;  Service: General;  Laterality: Left;   CARDIOVERSION N/A 01/11/2022   Procedure: CARDIOVERSION;  Surgeon: Jodelle Red, MD;  Location: [facility] ENDOSCOPY;  Service: Cardiovascular;  Laterality: N/A;   CATARACT EXTRACTION  2015   COLONOSCOPY  2010   DG  BONE DENSITY ([facility] HX)     EYE SURGERY Bilateral    cataracts   fiberadenoma Bilateral 1978, 1980   GANGLION CYST EXCISION     L  hand   Lipiflow procedure      Allergies  Allergen Reactions   Sulfa Antibiotics Other (See Comments)    As a child, became "rigid as a stick and not responsive"   Tape Other (See Comments)    Blisters, Please use "paper" tape only for short periods of time   Wound Dressing Adhesive Other (See Comments)    Blisters, Please use "paper" tape only for short periods of time, Blisters, Please use "paper" tape only for short periods of time   Glucosamine Forte [Nutritional Supplements] Rash   Latex Rash    Outpatient Encounter Medications as of 02/24/2023  Medication Sig   acetaminophen (TYLENOL) 500 MG tablet Take 1,000 mg by mouth in the morning.   acetaminophen (TYLENOL) 500 MG tablet Take 1,000 mg by mouth 2 (two) times daily as needed for moderate pain.   Biotin 5 MG CAPS Take 5 mg by mouth in the morning.   Calcium Carb-Cholecalciferol (CALCIUM 600+D3) 600-10 MG-MCG TABS Take 1 tablet by mouth in the morning.   Cholecalciferol (VITAMIN D3) 1000 UNITS CAPS Take 1,000 Units by mouth in the morning.   cycloSPORINE (RESTASIS) 0.05 % ophthalmic emulsion Place 1 drop into both eyes 2 (two) times daily.  after applying a hot compress   denosumab (PROLIA) 60 MG/ML SOSY injection Inject 60 mg into the skin every 6 (six) months. (Patient taking differently: Inject 60 mg into the skin.)   ELIQUIS 5 MG TABS tablet TAKE ONE TABLET BY MOUTH TWICE DAILY   escitalopram (LEXAPRO) 5 MG tablet Take 10 mg by mouth in the morning.   loperamide (IMODIUM A-D) 2 MG tablet Take 2 mg by mouth 3  (three) times daily as needed for diarrhea or loose stools.   metoprolol succinate (TOPROL XL) 25 MG 24 hr tablet Take 25 mg every morning   Multiple Vitamins-Minerals (MULTIVITAMIN WITH MINERALS) tablet Take 2 tablets by mouth in the morning.   NON FORMULARY Place 1 Application into both eyes in the morning and at bedtime. Hot compress to eyelids for 3-5 minutes twice daily   Omega-3 Fatty Acids (FISH OIL PO) Take 1 capsule by mouth in the morning. 350 mg-235 mg-90 mg-597 mg   polyethylene glycol (MIRALAX / GLYCOLAX) 17 g packet Take 17 g by mouth in the morning.   pravastatin (PRAVACHOL) 40 MG tablet TAKE ONE TABLET BY MOUTH ONCE DAILY (Patient taking differently: Take 40 mg by  mouth every evening.)   sodium chloride (MURO 128) 5 % ophthalmic ointment Place 1 Application into both eyes at bedtime.   Turmeric 500 MG CAPS Take 500 mg by mouth in the morning.   vitamin B-12 (CYANOCOBALAMIN) 1000 MCG tablet Take 1 tablet (1,000 mcg total) by mouth daily.   No facility-administered encounter medications on file as of 02/24/2023.    Review of Systems  Constitutional:  Positive for activity change. Negative for appetite change, chills, diaphoresis, fatigue, fever and unexpected weight change.  HENT:  Negative for congestion.   Eyes:  Negative for pain, discharge, redness and itching.       Vision worsening per pt  Respiratory:  Negative for cough, shortness of breath and wheezing.   Cardiovascular:  Negative for chest pain, palpitations and leg swelling.  Gastrointestinal:  Negative for abdominal distention, abdominal pain, constipation and diarrhea.  Genitourinary:  Negative for difficulty urinating and dysuria.  Musculoskeletal:  Positive for arthralgias, back pain and gait problem. Negative for joint swelling and myalgias.  Neurological:  Positive for weakness. Negative for dizziness, tremors, seizures, syncope, facial asymmetry, speech difficulty, light-headedness, numbness and headaches.   Psychiatric/Behavioral:  Positive for confusion. Negative for agitation and behavioral problems.     Immunization History  Administered Date(s) Administered   Fluad Quad(high Dose 65+) 02/27/2020, 03/29/2022   H1N1 07/01/2008   Influenza Whole 04/10/2007, 04/01/2009, 03/26/2010, 03/25/2012   Influenza, High Dose Seasonal PF 04/16/2014, 04/09/2015, 04/03/2017, 04/19/2018, 02/28/2019, 03/08/2021   Influenza-Unspecified 06/27/2016, 04/03/2017   Moderna Sars-Covid-2 Vaccination 04/27/2022   PFIZER Comirnaty(Gray Top)Covid-19 Tri-Sucrose Vaccine 01/05/2021   PFIZER(Purple Top)SARS-COV-2 Vaccination 07/18/2019, 08/08/2019, 03/29/2020   Pneumococcal Conjugate-13 05/13/2014   Pneumococcal Polysaccharide-23 08/29/2016   Td 11/05/2003   Tdap 06/28/2011, 09/19/2011, 10/23/2020   Zoster Recombinant(Shingrix) 10/08/2016, 01/03/2017   Zoster, Live 02/24/2010   Pertinent  Health Maintenance Due  Topic Date Due   INFLUENZA VACCINE  01/26/2023   MAMMOGRAM  07/12/2023   DEXA SCAN  Completed      12/07/2022   10:04 AM 12/20/2022   10:44 AM 01/03/2023    8:20 AM 01/24/2023   12:43 PM 02/20/2023   12:50 PM  Fall Risk  Falls in the past year? 0 1 1 1 1   Was there an injury with Fall? 0 1 1 0 0  Fall Risk Category Calculator 0 2 2 1 1   Patient at Risk for Falls Due to  Impaired balance/gait;Impaired mobility;History of fall(s) History of fall(s);Impaired balance/gait;Impaired mobility History of fall(s) History of fall(s);Impaired mobility  Fall risk Follow up Falls evaluation completed Falls evaluation completed Falls evaluation completed Falls evaluation completed;Education provided;Falls prevention discussed Falls evaluation completed   Functional Status Survey:    Vitals:   02/25/23 0713  BP: 137/80  Pulse: 80  Resp: 14  Temp: 97.6 F (36.4 C)  SpO2: 99%   There is no height or weight on file to calculate BMI. Physical Exam Vitals and nursing note reviewed.  Constitutional:       General: She is not in acute distress.    Appearance: She is not diaphoretic.  HENT:     Head: Normocephalic and atraumatic.  Neck:     Vascular: No JVD.  Cardiovascular:     Rate and Rhythm: Normal rate. Rhythm irregular.     Heart sounds: No murmur heard. Pulmonary:     Effort: Pulmonary effort is normal. No respiratory distress.     Breath sounds: Normal breath sounds. No wheezing.  Musculoskeletal:     Right  lower leg: No edema.     Left lower leg: No edema.     Comments: Strength 4/5, reduced to BUE and BLE  Skin:    General: Skin is warm and dry.  Neurological:     Mental Status: She is alert and oriented to person, place, and time.     Labs reviewed: Recent Labs    12/04/22 1906 12/11/22 1937 01/04/23 0000 01/30/23 2123 01/30/23 2133 02/23/23 0959  NA 141   < > 141 137 138 140  K 4.1   < > 4.1 4.0 3.8 3.7  CL 106   < > 104 102 103 101  CO2 26   < > 25* 26  --  31  GLUCOSE 96   < >  --  125* 123* 76  BUN 28*   < > 16 31* 32* 15  CREATININE 1.06*   < > 0.8 1.03* 1.00 0.94  CALCIUM 9.6   < > 9.5 10.0  --  9.8  MG 2.5*  --   --   --   --   --    < > = values in this interval not displayed.   Recent Labs    12/11/22 1937 01/30/23 2123 02/23/23 0959  AST 22 28 24   ALT 18 18 15   ALKPHOS 64 55 50  BILITOT 0.9 1.2 1.3*  PROT 7.1 5.7* 5.8*  ALBUMIN 4.2 3.5 3.5   Recent Labs    12/04/22 1906 12/11/22 1937 12/12/22 0859 12/13/22 0411 01/30/23 2123 01/30/23 2133 02/23/23 0959  WBC 8.5 9.1   < > 8.4 8.8  --  8.6  NEUTROABS 5.8 5.9  --   --   --   --  6.5  HGB 15.3* 17.6*   < > 16.5* 14.9 15.3* 15.4*  HCT 45.3 51.4*   < > 49.0* 43.8 45.0 45.7  MCV 92.1 91.8   < > 90.1 90.7  --  90.9  PLT 191 217   < > 208 208  --  208   < > = values in this interval not displayed.   Lab Results  Component Value Date   TSH 3.099 12/12/2022   Lab Results  Component Value Date   HGBA1C 4.8 05/11/2021   Lab Results  Component Value Date   CHOL 152 08/04/2022    HDL 63 08/04/2022   LDLCALC 70 08/04/2022   LDLDIRECT 153.4 11/16/2009   TRIG 91 08/04/2022   CHOLHDL 2.5 05/11/2021    Significant Diagnostic Results in last 30 days:  CT Head Wo Contrast  Result Date: 02/23/2023 CLINICAL DATA:  Unwitnessed fall, head injury, brain tumor EXAM: CT HEAD WITHOUT CONTRAST CT CERVICAL SPINE WITHOUT CONTRAST TECHNIQUE: Multidetector CT imaging of the head and cervical spine was performed following the standard protocol without intravenous contrast. Multiplanar CT image reconstructions of the cervical spine were also generated. RADIATION DOSE REDUCTION: This exam was performed according to the departmental dose-optimization program which includes automated exposure control, adjustment of the mA and/or kV according to patient size and/or use of iterative reconstruction technique. COMPARISON:  01/30/2023 FINDINGS: CT HEAD FINDINGS Brain: No evidence of acute infarction, hemorrhage, or hydrocephalus. Unchanged extra-axial mass of the anterior posterior fossa appearing to arise from the clivus measuring 3.1 x 2.7 cm (series 3, image 4). Periventricular and deep white matter hypodensity. Vascular: No hyperdense vessel or unexpected calcification. Skull: Normal. Negative for fracture or focal lesion. Sinuses/Orbits: No acute finding. Other: None. CT CERVICAL SPINE FINDINGS Alignment: Normal. Skull base and  vertebrae: No acute fracture. No primary bone lesion or focal pathologic process. Soft tissues and spinal canal: No prevertebral fluid or swelling. No visible canal hematoma. Disc levels: Moderate multilevel disc space height loss and osteophytosis throughout the cervical spine. Upper chest: Negative. Other: None. IMPRESSION: 1. No acute intracranial pathology. 2. Unchanged extra-axial mass of the anterior posterior fossa appearing to arise from the clivus measuring 3.1 x 2.7 cm, previously characterized as a meningioma. 3. No fracture or static subluxation of the cervical spine. 4.  Moderate multilevel cervical disc degenerative disease. Electronically Signed   By: Jearld Lesch M.D.   On: 02/23/2023 11:42   CT Cervical Spine Wo Contrast  Result Date: 02/23/2023 CLINICAL DATA:  Unwitnessed fall, head injury, brain tumor EXAM: CT HEAD WITHOUT CONTRAST CT CERVICAL SPINE WITHOUT CONTRAST TECHNIQUE: Multidetector CT imaging of the head and cervical spine was performed following the standard protocol without intravenous contrast. Multiplanar CT image reconstructions of the cervical spine were also generated. RADIATION DOSE REDUCTION: This exam was performed according to the departmental dose-optimization program which includes automated exposure control, adjustment of the mA and/or kV according to patient size and/or use of iterative reconstruction technique. COMPARISON:  01/30/2023 FINDINGS: CT HEAD FINDINGS Brain: No evidence of acute infarction, hemorrhage, or hydrocephalus. Unchanged extra-axial mass of the anterior posterior fossa appearing to arise from the clivus measuring 3.1 x 2.7 cm (series 3, image 4). Periventricular and deep white matter hypodensity. Vascular: No hyperdense vessel or unexpected calcification. Skull: Normal. Negative for fracture or focal lesion. Sinuses/Orbits: No acute finding. Other: None. CT CERVICAL SPINE FINDINGS Alignment: Normal. Skull base and vertebrae: No acute fracture. No primary bone lesion or focal pathologic process. Soft tissues and spinal canal: No prevertebral fluid or swelling. No visible canal hematoma. Disc levels: Moderate multilevel disc space height loss and osteophytosis throughout the cervical spine. Upper chest: Negative. Other: None. IMPRESSION: 1. No acute intracranial pathology. 2. Unchanged extra-axial mass of the anterior posterior fossa appearing to arise from the clivus measuring 3.1 x 2.7 cm, previously characterized as a meningioma. 3. No fracture or static subluxation of the cervical spine. 4. Moderate multilevel cervical disc  degenerative disease. Electronically Signed   By: Jearld Lesch M.D.   On: 02/23/2023 11:42   DG Pelvis Portable  Result Date: 02/23/2023 CLINICAL DATA:  Unwitnessed fall. EXAM: PORTABLE PELVIS 1-2 VIEWS COMPARISON:  01/30/2023. FINDINGS: Pelvis is intact with normal and symmetric sacroiliac joints. No acute fracture or dislocation. No aggressive osseous lesion. Visualized sacral arcuate lines are unremarkable. Unremarkable symphysis pubis. There are mild degenerative changes of bilateral hip joints. Moderate-to-severe degenerative changes of the visualized lower lumbar spine. No radiopaque foreign bodies. There are surgical staples overlying the pelvis. IMPRESSION: 1. No acute osseous abnormality.  Degenerative joint/disc disease. Electronically Signed   By: Jules Schick M.D.   On: 02/23/2023 10:42   DG Chest Port 1 View  Result Date: 02/23/2023 CLINICAL DATA:  trauma.  Unwitnessed fall. EXAM: PORTABLE CHEST 1 VIEW COMPARISON:  01/30/2023. FINDINGS: Bilateral lung fields are clear. Bilateral costophrenic angles are clear. Stable cardio-mediastinal silhouette. Aortic arch calcifications and dense mitral annulus calcifications are again seen. No acute osseous abnormalities. The soft tissues are within normal limits. Surgical staples are noted overlying the left breast/axillary region. IMPRESSION: 1. No active disease. 2. Aortic atherosclerosis. 3. Mitral annulus calcifications. Electronically Signed   By: Jules Schick M.D.   On: 02/23/2023 10:40    Assessment/Plan  1. Gait abnormality Recommend PT OT ST Rehab needed for  level of care assessment, possibly skilled care placement.   2. Meningioma (HCC) Increasing in size over the past year Not a surgical candidate.   3. Essential hypertension Improved with prn hydralazine.   4. Pure hypercholesterolemia Lab Results  Component Value Date   LDLCALC 70 08/04/2022   Continue pravachol   5. Paroxysmal atrial fibrillation (HCC) Rate is  controlled with metoprolol  Continues on Eliquis for CVA risk reduction If she continues to fall would consult with cardiology for risk vs benefit.   6. Localized osteoporosis without current pathological fracture Currently on Prolia Ca and VIt D supplement BMP done 8/29 Rescheduled for prolia in Sept due to ER visit.   7. History of breast cancer Mammogram done Jan 2024   8. Memory loss Progressing over time with the need for cuing and reminders. Followed by neurology, would not likely benefit from memory med.  Family/ staff Communication: nurse.   Labs/tests ordered:  NA

## 2023-02-27 DIAGNOSIS — M5459 Other low back pain: Secondary | ICD-10-CM | POA: Diagnosis not present

## 2023-02-27 DIAGNOSIS — M6259 Muscle wasting and atrophy, not elsewhere classified, multiple sites: Secondary | ICD-10-CM | POA: Diagnosis not present

## 2023-02-27 DIAGNOSIS — R296 Repeated falls: Secondary | ICD-10-CM | POA: Diagnosis not present

## 2023-02-27 DIAGNOSIS — Z8673 Personal history of transient ischemic attack (TIA), and cerebral infarction without residual deficits: Secondary | ICD-10-CM | POA: Diagnosis not present

## 2023-02-27 DIAGNOSIS — R2689 Other abnormalities of gait and mobility: Secondary | ICD-10-CM | POA: Diagnosis not present

## 2023-02-27 DIAGNOSIS — R41841 Cognitive communication deficit: Secondary | ICD-10-CM | POA: Diagnosis not present

## 2023-02-28 DIAGNOSIS — R41841 Cognitive communication deficit: Secondary | ICD-10-CM | POA: Diagnosis not present

## 2023-02-28 DIAGNOSIS — R278 Other lack of coordination: Secondary | ICD-10-CM | POA: Diagnosis not present

## 2023-02-28 DIAGNOSIS — M6389 Disorders of muscle in diseases classified elsewhere, multiple sites: Secondary | ICD-10-CM | POA: Diagnosis not present

## 2023-02-28 DIAGNOSIS — Z8673 Personal history of transient ischemic attack (TIA), and cerebral infarction without residual deficits: Secondary | ICD-10-CM | POA: Diagnosis not present

## 2023-02-28 DIAGNOSIS — R296 Repeated falls: Secondary | ICD-10-CM | POA: Diagnosis not present

## 2023-02-28 DIAGNOSIS — M5459 Other low back pain: Secondary | ICD-10-CM | POA: Diagnosis not present

## 2023-02-28 DIAGNOSIS — M6259 Muscle wasting and atrophy, not elsewhere classified, multiple sites: Secondary | ICD-10-CM | POA: Diagnosis not present

## 2023-02-28 DIAGNOSIS — R2689 Other abnormalities of gait and mobility: Secondary | ICD-10-CM | POA: Diagnosis not present

## 2023-03-01 ENCOUNTER — Encounter: Payer: Self-pay | Admitting: Orthopedic Surgery

## 2023-03-01 ENCOUNTER — Non-Acute Institutional Stay (SKILLED_NURSING_FACILITY): Payer: Medicare PPO | Admitting: Orthopedic Surgery

## 2023-03-01 DIAGNOSIS — M545 Low back pain, unspecified: Secondary | ICD-10-CM | POA: Diagnosis not present

## 2023-03-01 DIAGNOSIS — Z8673 Personal history of transient ischemic attack (TIA), and cerebral infarction without residual deficits: Secondary | ICD-10-CM | POA: Diagnosis not present

## 2023-03-01 DIAGNOSIS — R296 Repeated falls: Secondary | ICD-10-CM | POA: Diagnosis not present

## 2023-03-01 DIAGNOSIS — R531 Weakness: Secondary | ICD-10-CM | POA: Diagnosis not present

## 2023-03-01 DIAGNOSIS — S0511XA Contusion of eyeball and orbital tissues, right eye, initial encounter: Secondary | ICD-10-CM

## 2023-03-01 DIAGNOSIS — M6389 Disorders of muscle in diseases classified elsewhere, multiple sites: Secondary | ICD-10-CM | POA: Diagnosis not present

## 2023-03-01 DIAGNOSIS — M5459 Other low back pain: Secondary | ICD-10-CM | POA: Diagnosis not present

## 2023-03-01 DIAGNOSIS — G8929 Other chronic pain: Secondary | ICD-10-CM

## 2023-03-01 DIAGNOSIS — R4189 Other symptoms and signs involving cognitive functions and awareness: Secondary | ICD-10-CM | POA: Diagnosis not present

## 2023-03-01 DIAGNOSIS — R41841 Cognitive communication deficit: Secondary | ICD-10-CM | POA: Diagnosis not present

## 2023-03-01 DIAGNOSIS — R278 Other lack of coordination: Secondary | ICD-10-CM | POA: Diagnosis not present

## 2023-03-01 DIAGNOSIS — R2689 Other abnormalities of gait and mobility: Secondary | ICD-10-CM | POA: Diagnosis not present

## 2023-03-01 DIAGNOSIS — M6259 Muscle wasting and atrophy, not elsewhere classified, multiple sites: Secondary | ICD-10-CM | POA: Diagnosis not present

## 2023-03-01 NOTE — Progress Notes (Signed)
[facility] Room Number: 154-A Place of Service:  SNF [phone]) Provider:  Hazle Nordmann, NP  PCP: Mahlon Gammon, MD  Patient Care Team: Mahlon Gammon, MD as PCP - General (Internal Medicine) Croitoru, Rachelle Hora, MD as PCP - Cardiology (Cardiology) Nita Sells, MD as Consulting Physician (Dermatology) Nelson Chimes, MD as Consulting Physician (Ophthalmology) Drema Dallas, DO as Consulting Physician (Neurology) Axel Filler Larna Daughters, NP as Nurse Practitioner (Hematology and Oncology)  Extended Emergency Contact Information Primary Emergency Contact: Ray,Ruth  [place] of [place] Home Phone: [phone] Mobile Phone: [phone] Relation: Daughter Secondary Emergency Contact: Corliss [place] of [place] Home Phone: [phone] Relation: Son  Code Status:  DNR Goals of care: Advanced Directive information    03/01/2023   10:24 AM  Advanced Directives  Does Patient Have a Medical Advance Directive? Yes  Type of Estate agent of [place];Living will;Out of facility DNR (pink MOST or yellow form)  Does patient want to make changes to medical advance directive? No - Patient declined  Copy of Healthcare Power of Attorney in Chart? Yes - validated most recent copy scanned in chart (See row information)     Chief Complaint  Patient presents with   Acute Visit    Back pain.    HPI:  Pt is a 89 y.o. female seen today for acute visit due to back pain.   She currently resides on the rehab unit at [facility] due to weakness. PMH: PAF, HTN, HLD, left breast cancer, meningioma, osteopenia, and gait abnormality.   08/29 ED evaluation for mechanical fall unremarkable, advised to follow up with PCP. 08/30 she was transferred to rehab unit at [facility] due to weakness.   09/03 she was found on the bathroom floor with her pants down. She did not use call buell for assistance. She was able to get to bed  with assistance. She c/o increased back pain after event. She also had developed right sided black eye. On call provider held Eliquis x 2 doses.   Today, she denies pain to back and right eye. Denies changes to vision. She is unable to give details of fall yesterday, but remembers event. 02/27/2023 MMSE 19/30> was 22/30 08/21/2022. Ambulates with walker. She is currently working with PT/OT. Appetite fair. Family considering SNF placement.    Past Medical History:  Diagnosis Date   Allergy    Ankle fracture, left    Atrial fibrillation (HCC)    Per [facility] New Patient Packet    Breast cancer (HCC) 1115/16   left    Bursitis of right shoulder    Cataract    Family history of cancer    Fracture of right wrist    GERD (gastroesophageal reflux disease)    Hyperlipidemia    Hypertension    Lumbar radiculitis    Per [facility] New Patient Packet    Meningioma [facility])    Per [facility] New Patient Packet    Skin cancer 2000   melanoma and basal cell   Stress fracture    Right Heel, Per [facility] New Patient Packet    TIA (transient ischemic attack) 2022   Vaso vagal episode    during preparation for colonoscopy   Past Surgical History:  Procedure Laterality Date   ABDOMINAL HYSTERECTOMY  1988   TAH/BSO--FIBROIDS   APPENDECTOMY     BREAST LUMPECTOMY     B/L--[facility]   BREAST LUMPECTOMY WITH RADIOACTIVE SEED AND SENTINEL LYMPH NODE BIOPSY Left 06/11/2015  Procedure: LEFT BREAST LUMPECTOMY WITH RADIOACTIVE SEED AND LEFT SENTINEL LYMPH NODE MAPPING;  Surgeon: Harriette Bouillon, MD;  Location: [facility] OR;  Service: General;  Laterality: Left;   CARDIOVERSION N/A 01/11/2022   Procedure: CARDIOVERSION;  Surgeon: Jodelle Red, MD;  Location: [facility] ENDOSCOPY;  Service: Cardiovascular;  Laterality: N/A;   CATARACT EXTRACTION  2015   COLONOSCOPY  2010   DG  BONE DENSITY ([facility] HX)     EYE SURGERY Bilateral    cataracts   fiberadenoma Bilateral 1978, 1980   GANGLION CYST EXCISION     L  hand   Lipiflow procedure       Allergies  Allergen Reactions   Sulfa Antibiotics Other (See Comments)    As a child, became "rigid as a stick and not responsive"   Tape Other (See Comments)    Blisters, Please use "paper" tape only for short periods of time   Wound Dressing Adhesive Other (See Comments)    Blisters, Please use "paper" tape only for short periods of time, Blisters, Please use "paper" tape only for short periods of time   Glucosamine Forte [Nutritional Supplements] Rash   Latex Rash    Allergies as of 03/01/2023       Reactions   Sulfa Antibiotics Other (See Comments)   As a child, became "rigid as a stick and not responsive"   Tape Other (See Comments)   Blisters, Please use "paper" tape only for short periods of time   Wound Dressing Adhesive Other (See Comments)   Blisters, Please use "paper" tape only for short periods of time, Blisters, Please use "paper" tape only for short periods of time   Glucosamine Forte [nutritional Supplements] Rash   Latex Rash        Medication List        Accurate as of March 01, 2023 10:24 AM. If you have any questions, ask your nurse or doctor.          STOP taking these medications    denosumab 60 MG/ML Sosy injection Commonly known as: PROLIA Stopped by: Davonda Ausley E Shawnia Vizcarrondo   NON FORMULARY Stopped by: Octavia Heir       TAKE these medications    acetaminophen 500 MG tablet Commonly known as: TYLENOL Take 1,000 mg by mouth in the morning.   acetaminophen 500 MG tablet Commonly known as: TYLENOL Take 1,000 mg by mouth 2 (two) times daily as needed for moderate pain.   Biotin 5 MG Caps Take 5 mg by mouth in the morning.   Calcium 600+D3 600-10 MG-MCG Tabs Generic drug: Calcium Carb-Cholecalciferol Take 1 tablet by mouth in the morning.   cyanocobalamin 1000 MCG tablet Commonly known as: VITAMIN B12 Take 1 tablet (1,000 mcg total) by mouth daily.   cycloSPORINE 0.05 % ophthalmic emulsion Commonly known as: RESTASIS Place 1 drop  into both eyes 2 (two) times daily.  after applying a hot compress   Eliquis 5 MG Tabs tablet Generic drug: apixaban TAKE ONE TABLET BY MOUTH TWICE DAILY   escitalopram 5 MG tablet Commonly known as: LEXAPRO Take 10 mg by mouth in the morning.   FISH OIL PO Take 1 capsule by mouth in the morning. 350 mg-235 mg-90 mg-597 mg   hydrALAZINE 10 MG tablet Commonly known as: APRESOLINE Take 10 mg by mouth every 8 (eight) hours as needed.   loperamide 2 MG tablet Commonly known as: IMODIUM A-D Take 2 mg by mouth 3 (three) times daily as needed for diarrhea or  loose stools.   metoprolol succinate 25 MG 24 hr tablet Commonly known as: Toprol XL Take 25 mg every morning   multivitamin with minerals tablet Take 2 tablets by mouth in the morning.   polyethylene glycol 17 g packet Commonly known as: MIRALAX / GLYCOLAX Take 17 g by mouth in the morning.   pravastatin 40 MG tablet Commonly known as: PRAVACHOL TAKE ONE TABLET BY MOUTH ONCE DAILY   sodium chloride 5 % ophthalmic ointment Commonly known as: MURO 128 Place 1 Application into both eyes at bedtime.   Turmeric 500 MG Caps Take 500 mg by mouth in the morning.   Vitamin D3 25 MCG (1000 UT) Caps Take 1,000 Units by mouth in the morning.        Review of Systems  Unable to perform ROS: Dementia    Immunization History  Administered Date(s) Administered   Fluad Quad(high Dose 65+) 02/27/2020, 03/29/2022   H1N1 07/01/2008   Influenza Whole 04/10/2007, 04/01/2009, 03/26/2010, 03/25/2012   Influenza, High Dose Seasonal PF 04/16/2014, 04/09/2015, 04/03/2017, 04/19/2018, 02/28/2019, 03/08/2021   Influenza-Unspecified 06/27/2016, 04/03/2017   Moderna Sars-Covid-2 Vaccination 04/27/2022   PFIZER Comirnaty(Gray Top)Covid-19 Tri-Sucrose Vaccine 01/05/2021   PFIZER(Purple Top)SARS-COV-2 Vaccination 07/18/2019, 08/08/2019, 03/29/2020   Pneumococcal Conjugate-13 05/13/2014   Pneumococcal Polysaccharide-23 08/29/2016   Td  11/05/2003   Tdap 06/28/2011, 09/19/2011, 10/23/2020   Zoster Recombinant(Shingrix) 10/08/2016, 01/03/2017   Zoster, Live 02/24/2010   Pertinent  Health Maintenance Due  Topic Date Due   INFLUENZA VACCINE  01/26/2023   MAMMOGRAM  07/12/2023   DEXA SCAN  Completed      12/07/2022   10:04 AM 12/20/2022   10:44 AM 01/03/2023    8:20 AM 01/24/2023   12:43 PM 02/20/2023   12:50 PM  Fall Risk  Falls in the past year? 0 1 1 1 1   Was there an injury with Fall? 0 1 1 0 0  Fall Risk Category Calculator 0 2 2 1 1   Patient at Risk for Falls Due to  Impaired balance/gait;Impaired mobility;History of fall(s) History of fall(s);Impaired balance/gait;Impaired mobility History of fall(s) History of fall(s);Impaired mobility  Fall risk Follow up Falls evaluation completed Falls evaluation completed Falls evaluation completed Falls evaluation completed;Education provided;Falls prevention discussed Falls evaluation completed   Functional Status Survey:    Vitals:   03/01/23 1016  BP: (!) 149/83  Pulse: 64  Resp: 14  Temp: 98 F (36.7 C)  SpO2: 99%  Weight: 134 lb 9.6 oz (61.1 kg)  Height: 5\' 1"  (1.549 m)   Body mass index is 25.43 kg/m. Physical Exam Vitals reviewed.  Constitutional:      General: She is not in acute distress. HENT:     Head: Normocephalic. Contusion and right periorbital erythema present. No raccoon eyes, Battle's sign or abrasion.     Comments: Right perioribital contusion to upper and lower portion of eye    Nose: Nose normal.     Mouth/Throat:     Mouth: Mucous membranes are moist.  Eyes:     General:        Right eye: No discharge.        Left eye: No discharge.     Extraocular Movements: Extraocular movements intact.     Pupils: Pupils are equal, round, and reactive to light.  Cardiovascular:     Rate and Rhythm: Normal rate and regular rhythm.     Pulses: Normal pulses.     Heart sounds: Normal heart sounds.  Pulmonary:  Effort: Pulmonary effort is  normal. No respiratory distress.     Breath sounds: Normal breath sounds. No wheezing.  Abdominal:     General: Bowel sounds are normal.     Palpations: Abdomen is soft.  Musculoskeletal:     Cervical back: Normal and neck supple.     Thoracic back: Normal.     Lumbar back: Normal.     Right lower leg: No edema.     Left lower leg: No edema.     Comments: Mild bruising to right side, no skin breakdown  Skin:    General: Skin is warm.     Capillary Refill: Capillary refill takes less than 2 seconds.  Neurological:     General: No focal deficit present.     Mental Status: She is alert. Mental status is at baseline.     Motor: Weakness present.     Gait: Gait abnormal.     Comments: walker  Psychiatric:        Mood and Affect: Mood normal.     Comments: Alert to self/person/place, follows commands     Labs reviewed: Recent Labs    12/04/22 1906 12/11/22 1937 01/04/23 0000 01/30/23 2123 01/30/23 2133 02/23/23 0959  NA 141   < > 141 137 138 140  K 4.1   < > 4.1 4.0 3.8 3.7  CL 106   < > 104 102 103 101  CO2 26   < > 25* 26  --  31  GLUCOSE 96   < >  --  125* 123* 76  BUN 28*   < > 16 31* 32* 15  CREATININE 1.06*   < > 0.8 1.03* 1.00 0.94  CALCIUM 9.6   < > 9.5 10.0  --  9.8  MG 2.5*  --   --   --   --   --    < > = values in this interval not displayed.   Recent Labs    12/11/22 1937 01/30/23 2123 02/23/23 0959  AST 22 28 24   ALT 18 18 15   ALKPHOS 64 55 50  BILITOT 0.9 1.2 1.3*  PROT 7.1 5.7* 5.8*  ALBUMIN 4.2 3.5 3.5   Recent Labs    12/04/22 1906 12/11/22 1937 12/12/22 0859 12/13/22 0411 01/30/23 2123 01/30/23 2133 02/23/23 0959  WBC 8.5 9.1   < > 8.4 8.8  --  8.6  NEUTROABS 5.8 5.9  --   --   --   --  6.5  HGB 15.3* 17.6*   < > 16.5* 14.9 15.3* 15.4*  HCT 45.3 51.4*   < > 49.0* 43.8 45.0 45.7  MCV 92.1 91.8   < > 90.1 90.7  --  90.9  PLT 191 217   < > 208 208  --  208   < > = values in this interval not displayed.   Lab Results  Component  Value Date   TSH 3.099 12/12/2022   Lab Results  Component Value Date   HGBA1C 4.8 05/11/2021   Lab Results  Component Value Date   CHOL 152 08/04/2022   HDL 63 08/04/2022   LDLCALC 70 08/04/2022   LDLDIRECT 153.4 11/16/2009   TRIG 91 08/04/2022   CHOLHDL 2.5 05/11/2021    Significant Diagnostic Results in last 30 days:  CT Head Wo Contrast  Result Date: 02/23/2023 CLINICAL DATA:  Unwitnessed fall, head injury, brain tumor EXAM: CT HEAD WITHOUT CONTRAST CT CERVICAL SPINE WITHOUT CONTRAST TECHNIQUE: Multidetector CT imaging of the  head and cervical spine was performed following the standard protocol without intravenous contrast. Multiplanar CT image reconstructions of the cervical spine were also generated. RADIATION DOSE REDUCTION: This exam was performed according to the departmental dose-optimization program which includes automated exposure control, adjustment of the mA and/or kV according to patient size and/or use of iterative reconstruction technique. COMPARISON:  01/30/2023 FINDINGS: CT HEAD FINDINGS Brain: No evidence of acute infarction, hemorrhage, or hydrocephalus. Unchanged extra-axial mass of the anterior posterior fossa appearing to arise from the clivus measuring 3.1 x 2.7 cm (series 3, image 4). Periventricular and deep white matter hypodensity. Vascular: No hyperdense vessel or unexpected calcification. Skull: Normal. Negative for fracture or focal lesion. Sinuses/Orbits: No acute finding. Other: None. CT CERVICAL SPINE FINDINGS Alignment: Normal. Skull base and vertebrae: No acute fracture. No primary bone lesion or focal pathologic process. Soft tissues and spinal canal: No prevertebral fluid or swelling. No visible canal hematoma. Disc levels: Moderate multilevel disc space height loss and osteophytosis throughout the cervical spine. Upper chest: Negative. Other: None. IMPRESSION: 1. No acute intracranial pathology. 2. Unchanged extra-axial mass of the anterior posterior fossa  appearing to arise from the clivus measuring 3.1 x 2.7 cm, previously characterized as a meningioma. 3. No fracture or static subluxation of the cervical spine. 4. Moderate multilevel cervical disc degenerative disease. Electronically Signed   By: Jearld Lesch M.D.   On: 02/23/2023 11:42   CT Cervical Spine Wo Contrast  Result Date: 02/23/2023 CLINICAL DATA:  Unwitnessed fall, head injury, brain tumor EXAM: CT HEAD WITHOUT CONTRAST CT CERVICAL SPINE WITHOUT CONTRAST TECHNIQUE: Multidetector CT imaging of the head and cervical spine was performed following the standard protocol without intravenous contrast. Multiplanar CT image reconstructions of the cervical spine were also generated. RADIATION DOSE REDUCTION: This exam was performed according to the departmental dose-optimization program which includes automated exposure control, adjustment of the mA and/or kV according to patient size and/or use of iterative reconstruction technique. COMPARISON:  01/30/2023 FINDINGS: CT HEAD FINDINGS Brain: No evidence of acute infarction, hemorrhage, or hydrocephalus. Unchanged extra-axial mass of the anterior posterior fossa appearing to arise from the clivus measuring 3.1 x 2.7 cm (series 3, image 4). Periventricular and deep white matter hypodensity. Vascular: No hyperdense vessel or unexpected calcification. Skull: Normal. Negative for fracture or focal lesion. Sinuses/Orbits: No acute finding. Other: None. CT CERVICAL SPINE FINDINGS Alignment: Normal. Skull base and vertebrae: No acute fracture. No primary bone lesion or focal pathologic process. Soft tissues and spinal canal: No prevertebral fluid or swelling. No visible canal hematoma. Disc levels: Moderate multilevel disc space height loss and osteophytosis throughout the cervical spine. Upper chest: Negative. Other: None. IMPRESSION: 1. No acute intracranial pathology. 2. Unchanged extra-axial mass of the anterior posterior fossa appearing to arise from the clivus  measuring 3.1 x 2.7 cm, previously characterized as a meningioma. 3. No fracture or static subluxation of the cervical spine. 4. Moderate multilevel cervical disc degenerative disease. Electronically Signed   By: Jearld Lesch M.D.   On: 02/23/2023 11:42   DG Pelvis Portable  Result Date: 02/23/2023 CLINICAL DATA:  Unwitnessed fall. EXAM: PORTABLE PELVIS 1-2 VIEWS COMPARISON:  01/30/2023. FINDINGS: Pelvis is intact with normal and symmetric sacroiliac joints. No acute fracture or dislocation. No aggressive osseous lesion. Visualized sacral arcuate lines are unremarkable. Unremarkable symphysis pubis. There are mild degenerative changes of bilateral hip joints. Moderate-to-severe degenerative changes of the visualized lower lumbar spine. No radiopaque foreign bodies. There are surgical staples overlying the pelvis. IMPRESSION: 1. No  acute osseous abnormality.  Degenerative joint/disc disease. Electronically Signed   By: Jules Schick M.D.   On: 02/23/2023 10:42   DG Chest Port 1 View  Result Date: 02/23/2023 CLINICAL DATA:  trauma.  Unwitnessed fall. EXAM: PORTABLE CHEST 1 VIEW COMPARISON:  01/30/2023. FINDINGS: Bilateral lung fields are clear. Bilateral costophrenic angles are clear. Stable cardio-mediastinal silhouette. Aortic arch calcifications and dense mitral annulus calcifications are again seen. No acute osseous abnormalities. The soft tissues are within normal limits. Surgical staples are noted overlying the left breast/axillary region. IMPRESSION: 1. No active disease. 2. Aortic atherosclerosis. 3. Mitral annulus calcifications. Electronically Signed   By: Jules Schick M.D.   On: 02/23/2023 10:40   CT HEAD WO CONTRAST  Result Date: 01/30/2023 CLINICAL DATA:  Trauma EXAM: CT HEAD WITHOUT CONTRAST CT MAXILLOFACIAL WITHOUT CONTRAST CT CERVICAL SPINE WITHOUT CONTRAST TECHNIQUE: Multidetector CT imaging of the head, cervical spine, and maxillofacial structures were performed using the standard  protocol without intravenous contrast. Multiplanar CT image reconstructions of the cervical spine and maxillofacial structures were also generated. RADIATION DOSE REDUCTION: This exam was performed according to the departmental dose-optimization program which includes automated exposure control, adjustment of the mA and/or kV according to patient size and/or use of iterative reconstruction technique. COMPARISON:  12/11/2022 FINDINGS: CT HEAD FINDINGS Brain: Unchanged 3.0 cm mass at the right cervicomedullary junction with leftward deviation of the brainstem/upper cervical spinal cord. No acute hemorrhage or extra-axial collection. There is generalized atrophy without lobar predilection. There is hypoattenuation of the periventricular white matter, most commonly indicating chronic ischemic microangiopathy. Vascular: Atherosclerotic calcification of the internal carotid arteries at the skull base. No abnormal hyperdensity of the major intracranial arteries or dural venous sinuses. Skull: The visualized skull base, calvarium and extracranial soft tissues are normal. CT MAXILLOFACIAL FINDINGS Osseous: No facial fracture or mandibular dislocation. Orbits: The globes are intact. Normal appearance of the intra- and extraconal fat. Symmetric extraocular muscles and optic nerves. Sinuses: No fluid levels or advanced mucosal thickening. Soft tissues: Normal visualized extracranial soft tissues. CT CERVICAL SPINE FINDINGS Alignment: No static subluxation. Facets are aligned. Occipital condyles and the lateral masses of C1-C2 are aligned. Skull base and vertebrae: No acute fracture. Soft tissues and spinal canal: No prevertebral fluid or swelling. No visible canal hematoma. Disc levels: No advanced spinal canal or neural foraminal stenosis. Upper chest: No pneumothorax, pulmonary nodule or pleural effusion. Other: Normal visualized paraspinal cervical soft tissues. IMPRESSION: 1. No acute intracranial abnormality. 2. No acute  fracture or static subluxation of the cervical spine. 3. No facial fracture. 4. Unchanged appearance of 3.0 cm mass (likely meningioma) at the right cervicomedullary junction with leftward deviation of the brainstem/upper cervical spinal cord. Electronically Signed   By: Deatra Robinson M.D.   On: 01/30/2023 22:24   CT MAXILLOFACIAL WO CONTRAST  Result Date: 01/30/2023 CLINICAL DATA:  Trauma EXAM: CT HEAD WITHOUT CONTRAST CT MAXILLOFACIAL WITHOUT CONTRAST CT CERVICAL SPINE WITHOUT CONTRAST TECHNIQUE: Multidetector CT imaging of the head, cervical spine, and maxillofacial structures were performed using the standard protocol without intravenous contrast. Multiplanar CT image reconstructions of the cervical spine and maxillofacial structures were also generated. RADIATION DOSE REDUCTION: This exam was performed according to the departmental dose-optimization program which includes automated exposure control, adjustment of the mA and/or kV according to patient size and/or use of iterative reconstruction technique. COMPARISON:  12/11/2022 FINDINGS: CT HEAD FINDINGS Brain: Unchanged 3.0 cm mass at the right cervicomedullary junction with leftward deviation of the brainstem/upper cervical spinal cord. No  acute hemorrhage or extra-axial collection. There is generalized atrophy without lobar predilection. There is hypoattenuation of the periventricular white matter, most commonly indicating chronic ischemic microangiopathy. Vascular: Atherosclerotic calcification of the internal carotid arteries at the skull base. No abnormal hyperdensity of the major intracranial arteries or dural venous sinuses. Skull: The visualized skull base, calvarium and extracranial soft tissues are normal. CT MAXILLOFACIAL FINDINGS Osseous: No facial fracture or mandibular dislocation. Orbits: The globes are intact. Normal appearance of the intra- and extraconal fat. Symmetric extraocular muscles and optic nerves. Sinuses: No fluid levels or  advanced mucosal thickening. Soft tissues: Normal visualized extracranial soft tissues. CT CERVICAL SPINE FINDINGS Alignment: No static subluxation. Facets are aligned. Occipital condyles and the lateral masses of C1-C2 are aligned. Skull base and vertebrae: No acute fracture. Soft tissues and spinal canal: No prevertebral fluid or swelling. No visible canal hematoma. Disc levels: No advanced spinal canal or neural foraminal stenosis. Upper chest: No pneumothorax, pulmonary nodule or pleural effusion. Other: Normal visualized paraspinal cervical soft tissues. IMPRESSION: 1. No acute intracranial abnormality. 2. No acute fracture or static subluxation of the cervical spine. 3. No facial fracture. 4. Unchanged appearance of 3.0 cm mass (likely meningioma) at the right cervicomedullary junction with leftward deviation of the brainstem/upper cervical spinal cord. Electronically Signed   By: Deatra Robinson M.D.   On: 01/30/2023 22:24   CT CERVICAL SPINE WO CONTRAST  Result Date: 01/30/2023 CLINICAL DATA:  Trauma EXAM: CT HEAD WITHOUT CONTRAST CT MAXILLOFACIAL WITHOUT CONTRAST CT CERVICAL SPINE WITHOUT CONTRAST TECHNIQUE: Multidetector CT imaging of the head, cervical spine, and maxillofacial structures were performed using the standard protocol without intravenous contrast. Multiplanar CT image reconstructions of the cervical spine and maxillofacial structures were also generated. RADIATION DOSE REDUCTION: This exam was performed according to the departmental dose-optimization program which includes automated exposure control, adjustment of the mA and/or kV according to patient size and/or use of iterative reconstruction technique. COMPARISON:  12/11/2022 FINDINGS: CT HEAD FINDINGS Brain: Unchanged 3.0 cm mass at the right cervicomedullary junction with leftward deviation of the brainstem/upper cervical spinal cord. No acute hemorrhage or extra-axial collection. There is generalized atrophy without lobar predilection.  There is hypoattenuation of the periventricular white matter, most commonly indicating chronic ischemic microangiopathy. Vascular: Atherosclerotic calcification of the internal carotid arteries at the skull base. No abnormal hyperdensity of the major intracranial arteries or dural venous sinuses. Skull: The visualized skull base, calvarium and extracranial soft tissues are normal. CT MAXILLOFACIAL FINDINGS Osseous: No facial fracture or mandibular dislocation. Orbits: The globes are intact. Normal appearance of the intra- and extraconal fat. Symmetric extraocular muscles and optic nerves. Sinuses: No fluid levels or advanced mucosal thickening. Soft tissues: Normal visualized extracranial soft tissues. CT CERVICAL SPINE FINDINGS Alignment: No static subluxation. Facets are aligned. Occipital condyles and the lateral masses of C1-C2 are aligned. Skull base and vertebrae: No acute fracture. Soft tissues and spinal canal: No prevertebral fluid or swelling. No visible canal hematoma. Disc levels: No advanced spinal canal or neural foraminal stenosis. Upper chest: No pneumothorax, pulmonary nodule or pleural effusion. Other: Normal visualized paraspinal cervical soft tissues. IMPRESSION: 1. No acute intracranial abnormality. 2. No acute fracture or static subluxation of the cervical spine. 3. No facial fracture. 4. Unchanged appearance of 3.0 cm mass (likely meningioma) at the right cervicomedullary junction with leftward deviation of the brainstem/upper cervical spinal cord. Electronically Signed   By: Deatra Robinson M.D.   On: 01/30/2023 22:24   DG Pelvis Portable  Result Date: 01/30/2023  CLINICAL DATA:  Status post fall. EXAM: PORTABLE PELVIS 1-2 VIEWS COMPARISON:  None Available. FINDINGS: There is no evidence of pelvic fracture or diastasis. No pelvic bone lesions are seen. Marked severity degenerative changes are seen within the visualized portion of the lower lumbar spine. Multiple radiopaque surgical clips are  seen overlying the pelvis. IMPRESSION: 1. No acute osseous abnormality. Electronically Signed   By: Aram Candela M.D.   On: 01/30/2023 21:42   DG Chest Port 1 View  Result Date: 01/30/2023 CLINICAL DATA:  Status post fall. EXAM: PORTABLE CHEST 1 VIEW COMPARISON:  [place] 16, 2024 FINDINGS: The cardiac silhouette is mildly enlarged and unchanged in size. There is moderate severity calcification of the aortic arch. Low lung volumes are noted, without evidence of acute infiltrate, pleural effusion or pneumothorax. Radiopaque surgical clips are seen within the soft tissues of the left breast. No acute osseous abnormalities are identified. IMPRESSION: Low lung volumes without acute or active cardiopulmonary disease. Electronically Signed   By: Aram Candela M.D.   On: 01/30/2023 21:39    Assessment/Plan 1. Frequent falls - 08/29 mechanical fall> ED evaluation unremarkable - 09/03 mechanical fall> found on bathroom floor/ did not call for assistance - 08/30 admitted to rehab unit - cont PT/OT - if no improvement will need SNF  2. Weakness generalized - see above  3. Contusion, periorbital, right, initial encounter - right eye contusion to upper and lower portion of eye - from mechanical fall 09/30 - vision, EOM normal - apply ice to right eye TID, 20 minute intervals x 5 days - Eliquis held x 2 doses  4. Cognitive impairment - 09/02 MMSE 19/30> was 22/30 (07/2022) - no behaviors - not on medication - needing more assistance with ADLs  5. Chronic bilateral low back pain without sciatica - ongoing - recent CT spine noted moderate multilevel DDD - cont tylenol      Family/ staff Communication: plan discussed with patient and nurse  Labs/tests ordered:  none

## 2023-03-02 DIAGNOSIS — R296 Repeated falls: Secondary | ICD-10-CM | POA: Diagnosis not present

## 2023-03-02 DIAGNOSIS — R2689 Other abnormalities of gait and mobility: Secondary | ICD-10-CM | POA: Diagnosis not present

## 2023-03-02 DIAGNOSIS — M6259 Muscle wasting and atrophy, not elsewhere classified, multiple sites: Secondary | ICD-10-CM | POA: Diagnosis not present

## 2023-03-02 DIAGNOSIS — M5459 Other low back pain: Secondary | ICD-10-CM | POA: Diagnosis not present

## 2023-03-02 DIAGNOSIS — R278 Other lack of coordination: Secondary | ICD-10-CM | POA: Diagnosis not present

## 2023-03-02 DIAGNOSIS — R41841 Cognitive communication deficit: Secondary | ICD-10-CM | POA: Diagnosis not present

## 2023-03-02 DIAGNOSIS — Z8673 Personal history of transient ischemic attack (TIA), and cerebral infarction without residual deficits: Secondary | ICD-10-CM | POA: Diagnosis not present

## 2023-03-02 DIAGNOSIS — M6389 Disorders of muscle in diseases classified elsewhere, multiple sites: Secondary | ICD-10-CM | POA: Diagnosis not present

## 2023-03-03 DIAGNOSIS — M6259 Muscle wasting and atrophy, not elsewhere classified, multiple sites: Secondary | ICD-10-CM | POA: Diagnosis not present

## 2023-03-03 DIAGNOSIS — R278 Other lack of coordination: Secondary | ICD-10-CM | POA: Diagnosis not present

## 2023-03-03 DIAGNOSIS — M6389 Disorders of muscle in diseases classified elsewhere, multiple sites: Secondary | ICD-10-CM | POA: Diagnosis not present

## 2023-03-03 DIAGNOSIS — Z8673 Personal history of transient ischemic attack (TIA), and cerebral infarction without residual deficits: Secondary | ICD-10-CM | POA: Diagnosis not present

## 2023-03-03 DIAGNOSIS — R2689 Other abnormalities of gait and mobility: Secondary | ICD-10-CM | POA: Diagnosis not present

## 2023-03-03 DIAGNOSIS — R41841 Cognitive communication deficit: Secondary | ICD-10-CM | POA: Diagnosis not present

## 2023-03-03 DIAGNOSIS — R296 Repeated falls: Secondary | ICD-10-CM | POA: Diagnosis not present

## 2023-03-03 DIAGNOSIS — M5459 Other low back pain: Secondary | ICD-10-CM | POA: Diagnosis not present

## 2023-03-04 DIAGNOSIS — R278 Other lack of coordination: Secondary | ICD-10-CM | POA: Diagnosis not present

## 2023-03-04 DIAGNOSIS — M5459 Other low back pain: Secondary | ICD-10-CM | POA: Diagnosis not present

## 2023-03-04 DIAGNOSIS — M6389 Disorders of muscle in diseases classified elsewhere, multiple sites: Secondary | ICD-10-CM | POA: Diagnosis not present

## 2023-03-06 ENCOUNTER — Non-Acute Institutional Stay (SKILLED_NURSING_FACILITY): Payer: Self-pay | Admitting: Internal Medicine

## 2023-03-06 DIAGNOSIS — M6259 Muscle wasting and atrophy, not elsewhere classified, multiple sites: Secondary | ICD-10-CM | POA: Diagnosis not present

## 2023-03-06 DIAGNOSIS — D329 Benign neoplasm of meninges, unspecified: Secondary | ICD-10-CM

## 2023-03-06 DIAGNOSIS — E7800 Pure hypercholesterolemia, unspecified: Secondary | ICD-10-CM

## 2023-03-06 DIAGNOSIS — S0511XS Contusion of eyeball and orbital tissues, right eye, sequela: Secondary | ICD-10-CM

## 2023-03-06 DIAGNOSIS — Z8673 Personal history of transient ischemic attack (TIA), and cerebral infarction without residual deficits: Secondary | ICD-10-CM | POA: Diagnosis not present

## 2023-03-06 DIAGNOSIS — R4189 Other symptoms and signs involving cognitive functions and awareness: Secondary | ICD-10-CM

## 2023-03-06 DIAGNOSIS — R41841 Cognitive communication deficit: Secondary | ICD-10-CM | POA: Diagnosis not present

## 2023-03-06 DIAGNOSIS — R269 Unspecified abnormalities of gait and mobility: Secondary | ICD-10-CM

## 2023-03-06 DIAGNOSIS — I10 Essential (primary) hypertension: Secondary | ICD-10-CM

## 2023-03-06 DIAGNOSIS — M5459 Other low back pain: Secondary | ICD-10-CM | POA: Diagnosis not present

## 2023-03-06 DIAGNOSIS — R2689 Other abnormalities of gait and mobility: Secondary | ICD-10-CM | POA: Diagnosis not present

## 2023-03-06 DIAGNOSIS — I480 Paroxysmal atrial fibrillation: Secondary | ICD-10-CM | POA: Diagnosis not present

## 2023-03-06 DIAGNOSIS — M6389 Disorders of muscle in diseases classified elsewhere, multiple sites: Secondary | ICD-10-CM | POA: Diagnosis not present

## 2023-03-06 DIAGNOSIS — R296 Repeated falls: Secondary | ICD-10-CM

## 2023-03-06 DIAGNOSIS — G8929 Other chronic pain: Secondary | ICD-10-CM

## 2023-03-06 DIAGNOSIS — R278 Other lack of coordination: Secondary | ICD-10-CM | POA: Diagnosis not present

## 2023-03-06 DIAGNOSIS — M816 Localized osteoporosis [Lequesne]: Secondary | ICD-10-CM

## 2023-03-06 DIAGNOSIS — M545 Low back pain, unspecified: Secondary | ICD-10-CM

## 2023-03-06 NOTE — Progress Notes (Signed)
Provider:   Location:  Medical illustrator of Service:  SNF ([id number])  PCP: Mahlon Gammon, MD Patient Care Team: Mahlon Gammon, MD as PCP - General (Internal Medicine) Croitoru, Rachelle Hora, MD as PCP - Cardiology (Cardiology) Nita Sells, MD as Consulting Physician (Dermatology) Nelson Chimes, MD as Consulting Physician (Ophthalmology) Drema Dallas, DO as Consulting Physician (Neurology) Axel Filler, Larna Daughters, NP as Nurse Practitioner (Hematology and Oncology)  Extended Emergency Contact Information Primary Emergency Contact: Ray,Ruth  [place] of [place] Home Phone: [phone] Mobile Phone: [phone] Relation: Daughter Secondary Emergency Contact: Corliss [place] Home Phone: [phone] Relation: Son  Code Status: DNR Goals of Care: Advanced Directive information    03/01/2023   10:24 AM  Advanced Directives  Does Patient Have a Medical Advance Directive? Yes  Type of Estate agent of [place];Living will;Out of facility DNR (pink MOST or yellow form)  Does patient want to make changes to medical advance directive? No - Patient declined  Copy of Healthcare Power of Attorney in Chart? Yes - validated most recent copy scanned in chart (See row information)      Chief Complaint  Patient presents with   Readmit To SNF    HPI: Patient is a 89 y.o. female seen today for admission to Rehab  [place] was living  in [place].    Patient has known h/o Meningioma. Has been to ED many times with number of MRI . MRI it showed meningioma again which has increased in size causing compression on medulla She has seen  neurosurgery and  told that she is not a surgical candidate Per Dr. Moises Blood note there is not much neurology can do either.  She did not have any acute stroke.  She already is on Eliquis.  She continues to have falls Most of the time forgets to call for help and she says her legs gave  way Larey Seat on 02/23/23 Send to ED CT Scan negative for any acute issues Now in Rehab Plan fo SNF for long term care Had another fall on 02/28/2023 in Facility in the bathroom  Now Has Right orbital Contusion  Also having occasional Blurry vision  Cognitive changes  Her main complain continues to be dizziness and weakness  Other issues   H/o Low Back Pain with Radiculopathy follows with Neurology H/o Breast Cancer Invasive Ductal Carcinoma diagnosed in 2016 On Prolia for Osteoporosis H/o PAF  Follows with Cardiology On Eliquis Also h/o Bradycardia Hypertension with Tendency for Orthostatic Hypotension      Past Medical History:  Diagnosis Date   Allergy    Ankle fracture, left    Atrial fibrillation (HCC)    Per [facility] New Patient Packet    Breast cancer (HCC) 1115/16   left    Bursitis of right shoulder    Cataract    Family history of cancer    Fracture of right wrist    GERD (gastroesophageal reflux disease)    Hyperlipidemia    Hypertension    Lumbar radiculitis    Per [facility] New Patient Packet    Meningioma [facility])    Per [facility] New Patient Packet    Skin cancer 2000   melanoma and basal cell   Stress fracture    Right Heel, Per [facility] New Patient Packet    TIA (transient ischemic attack) 2022   Vaso vagal episode    during preparation for colonoscopy   Past Surgical History:  Procedure Laterality Date  ABDOMINAL HYSTERECTOMY  1988   TAH/BSO--FIBROIDS   APPENDECTOMY     BREAST LUMPECTOMY     B/L--[facility]   BREAST LUMPECTOMY WITH RADIOACTIVE SEED AND SENTINEL LYMPH NODE BIOPSY Left 06/11/2015   Procedure: LEFT BREAST LUMPECTOMY WITH RADIOACTIVE SEED AND LEFT SENTINEL LYMPH NODE MAPPING;  Surgeon: Harriette Bouillon, MD;  Location: [facility] OR;  Service: General;  Laterality: Left;   CARDIOVERSION N/A 01/11/2022   Procedure: CARDIOVERSION;  Surgeon: Jodelle Red, MD;  Location: [facility] ENDOSCOPY;  Service: Cardiovascular;  Laterality: N/A;   CATARACT EXTRACTION  2015    COLONOSCOPY  2010   DG  BONE DENSITY ([facility] HX)     EYE SURGERY Bilateral    cataracts   fiberadenoma Bilateral 1978, 1980   GANGLION CYST EXCISION     L  hand   Lipiflow procedure      reports that she has never smoked. She has never used smokeless tobacco. She reports current alcohol use. She reports that she does not use drugs. Social History   Socioeconomic History   Marital status: Widowed    Spouse name: Not on file   Number of children: 2   Years of education: Not on file   Highest education level: Master's degree (e.g., MA, MS, MEng, MEd, [name], [name])  Occupational History   Occupation: Magazine features editor: RETIRED    Comment: retired  Tobacco Use   Smoking status: Never   Smokeless tobacco: Never  Vaping Use   Vaping status: Never Used  Substance and Sexual Activity   Alcohol use: Yes    Comment: rare   Drug use: No   Sexual activity: Never  Other Topics Concern   Not on file  Social History Narrative   Patient is widowed.  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    One son one daughter   1 caffeinated beverage daily   She is right-handed.    She works out everyday to a televised [employer] 30 minute program.   No tobacco or alcohol         Per [facility] New Patient Packet Abstracted on 01/03/2020      Diet: Left blank       Caffeine: Yes      Married, if yes what year: Widowed, married in 1955      Do you live in a house, apartment, assisted living, condo, trailer, ect: Apartment      Is it one or more stories: One stories, one person       Pets: 1 Medical laboratory scientific officer      Current/Past profession: Runner, broadcasting/film/video      Highest level or education completed: MA of Education       Exercise:   Yes             Type and how often: Cardio, chair aerobics, 3-4 times weekly          Living Will: Yes   DNR: Yes   POA/HPOA: Yes      Functional Status:   Do you have difficulty bathing or dressing yourself? Left blank   Do you have difficulty preparing food or eating? Left blank    Do you have difficulty managing your medications? Left blank   Do you have difficulty managing your finances? Left blank   Do you have difficulty affording your medications? Left blank   Social Determinants of Health   Financial Resource Strain: Low Risk  (03/07/2023)   Overall Financial Resource Strain (CARDIA)  Difficulty of Paying Living Expenses: Not hard at all  Food Insecurity: No Food Insecurity (03/07/2023)   Hunger Vital Sign    Worried About Running Out of Food in the Last Year: Never true    Ran Out of Food in the Last Year: Never true  Transportation Needs: No Transportation Needs (03/07/2023)   PRAPARE - Administrator, Civil Service (Medical): No    Lack of Transportation (Non-Medical): No  Physical Activity: Sufficiently Active (03/07/2023)   Exercise Vital Sign    Days of Exercise per Week: 5 days    Minutes of Exercise per Session: 30 min  Stress: No Stress Concern Present (03/07/2023)   [employer] of Occupational Health - Occupational Stress Questionnaire    Feeling of Stress : Only a little  Social Connections: Socially Isolated (03/07/2023)   Social Connection and Isolation Panel [NHANES]    Frequency of Communication with Friends and Family: More than three times a week    Frequency of Social Gatherings with Friends and Family: More than three times a week    Attends Religious Services: Never    Database administrator or Organizations: No    Attends Banker Meetings: Never    Marital Status: Widowed  Intimate Partner Violence: Not At Risk (03/07/2023)   Humiliation, Afraid, Rape, and Kick questionnaire    Fear of Current or Ex-Partner: No    Emotionally Abused: No    Physically Abused: No    Sexually Abused: No    Functional Status Survey:    Family History  Problem Relation Age of Onset   Cancer Mother        mets to bone--? primary   Coronary artery disease Brother        died of M! @ 35   Diabetes Brother     Hyperlipidemia Brother    Hypertension Brother    Heart disease Brother        chf   Heart disease Father 35       MI   Lung cancer Sister 39       former smoker   Diabetes Maternal Aunt    Lung cancer Other    Thrombosis Other        thromboembolism clotting disorder--? father died of ? clot    Hypertension Son    High Cholesterol Son     Health Maintenance  Topic Date Due   INFLUENZA VACCINE  01/26/2023   COVID-19 Vaccine (6 - 2023-24 season) 02/26/2023   MAMMOGRAM  07/12/2023   Medicare Annual Wellness (AWV)  03/06/2024   DTaP/Tdap/Td (5 - Td or Tdap) 10/24/2030   Pneumonia Vaccine 31+ Years old  Completed   DEXA SCAN  Completed   Zoster Vaccines- Shingrix  Completed   HPV VACCINES  Aged Out    Allergies  Allergen Reactions   Sulfa Antibiotics Other (See Comments)    As a child, became "rigid as a stick and not responsive"   Tape Other (See Comments)    Blisters, Please use "paper" tape only for short periods of time   Wound Dressing Adhesive Other (See Comments)    Blisters, Please use "paper" tape only for short periods of time, Blisters, Please use "paper" tape only for short periods of time   Glucosamine Forte [Nutritional Supplements] Rash   Latex Rash    Outpatient Encounter Medications as of 03/06/2023  Medication Sig   acetaminophen (TYLENOL) 500 MG tablet Take 1,000 mg by mouth in the  morning.   acetaminophen (TYLENOL) 500 MG tablet Take 1,000 mg by mouth 2 (two) times daily as needed for moderate pain.   Biotin 5 MG CAPS Take 5 mg by mouth in the morning.   Calcium Carb-Cholecalciferol (CALCIUM 600+D3) 600-10 MG-MCG TABS Take 1 tablet by mouth in the morning.   Cholecalciferol (VITAMIN D3) 1000 UNITS CAPS Take 1,000 Units by mouth in the morning.   cycloSPORINE (RESTASIS) 0.05 % ophthalmic emulsion Place 1 drop into both eyes 2 (two) times daily.  after applying a hot compress   ELIQUIS 5 MG TABS tablet TAKE ONE TABLET BY MOUTH TWICE DAILY   escitalopram  (LEXAPRO) 5 MG tablet Take 10 mg by mouth in the morning.   hydrALAZINE (APRESOLINE) 10 MG tablet Take 10 mg by mouth every 8 (eight) hours as needed.   loperamide (IMODIUM A-D) 2 MG tablet Take 2 mg by mouth 3 (three) times daily as needed for diarrhea or loose stools.   metoprolol succinate (TOPROL XL) 25 MG 24 hr tablet Take 25 mg every morning   Multiple Vitamins-Minerals (MULTIVITAMIN WITH MINERALS) tablet Take 2 tablets by mouth in the morning.   Omega-3 Fatty Acids (FISH OIL PO) Take 1 capsule by mouth in the morning. 350 mg-235 mg-90 mg-597 mg   polyethylene glycol (MIRALAX / GLYCOLAX) 17 g packet Take 17 g by mouth in the morning.   pravastatin (PRAVACHOL) 40 MG tablet TAKE ONE TABLET BY MOUTH ONCE DAILY   sodium chloride (MURO 128) 5 % ophthalmic ointment Place 1 Application into both eyes at bedtime.   Turmeric 500 MG CAPS Take 500 mg by mouth in the morning.   vitamin B-12 (CYANOCOBALAMIN) 1000 MCG tablet Take 1 tablet (1,000 mcg total) by mouth daily.   No facility-administered encounter medications on file as of 03/06/2023.    Review of Systems  Constitutional:  Positive for activity change. Negative for appetite change.  HENT: Negative.    Respiratory:  Negative for cough and shortness of breath.   Cardiovascular:  Negative for leg swelling.  Gastrointestinal:  Negative for constipation.  Genitourinary: Negative.   Musculoskeletal:  Positive for gait problem. Negative for arthralgias and myalgias.  Skin: Negative.   Neurological:  Positive for dizziness and weakness.  Psychiatric/Behavioral:  Positive for confusion. Negative for dysphoric mood and sleep disturbance.     Vitals:   03/06/23 1959  BP: 138/89  Pulse: 87  Resp: 18  Temp: (!) 97 F (36.1 C)  Weight: 132 lb 12.8 oz (60.2 kg)   Body mass index is 25.09 kg/m. Physical Exam Vitals reviewed.  Constitutional:      Appearance: Normal appearance.  HENT:     Head: Normocephalic.     Nose: Nose normal.      Mouth/Throat:     Mouth: Mucous membranes are moist.     Pharynx: Oropharynx is clear.  Eyes:     Extraocular Movements: Extraocular movements intact.     Pupils: Pupils are equal, round, and reactive to light.     Comments: Right Periorbital Contusion  Cardiovascular:     Rate and Rhythm: Normal rate. Rhythm irregular.     Pulses: Normal pulses.     Heart sounds: Normal heart sounds. No murmur heard. Pulmonary:     Effort: Pulmonary effort is normal.     Breath sounds: Normal breath sounds.  Abdominal:     General: Abdomen is flat. Bowel sounds are normal.     Palpations: Abdomen is soft.  Musculoskeletal:  General: No swelling.     Cervical back: Neck supple.  Skin:    General: Skin is warm.  Neurological:     General: No focal deficit present.     Mental Status: She is alert.     Comments: But very weak when stands up Gait unstable  Psychiatric:        Mood and Affect: Mood normal.        Thought Content: Thought content normal.    Labs reviewed: Basic Metabolic Panel: Recent Labs    12/04/22 1906 12/11/22 1937 01/04/23 0000 01/30/23 2123 01/30/23 2133 02/23/23 0959  NA 141   < > 141 137 138 140  K 4.1   < > 4.1 4.0 3.8 3.7  CL 106   < > 104 102 103 101  CO2 26   < > 25* 26  --  31  GLUCOSE 96   < >  --  125* 123* 76  BUN 28*   < > 16 31* 32* 15  CREATININE 1.06*   < > 0.8 1.03* 1.00 0.94  CALCIUM 9.6   < > 9.5 10.0  --  9.8  MG 2.5*  --   --   --   --   --    < > = values in this interval not displayed.   Liver Function Tests: Recent Labs    12/11/22 1937 01/30/23 2123 02/23/23 0959  AST 22 28 24   ALT 18 18 15   ALKPHOS 64 55 50  BILITOT 0.9 1.2 1.3*  PROT 7.1 5.7* 5.8*  ALBUMIN 4.2 3.5 3.5   No results for input(s): "LIPASE", "AMYLASE" in the last 8760 hours. Recent Labs    12/11/22 2043  AMMONIA 12   CBC: Recent Labs    12/04/22 1906 12/11/22 1937 12/12/22 0859 12/13/22 0411 01/30/23 2123 01/30/23 2133 02/23/23 0959  WBC 8.5  9.1   < > 8.4 8.8  --  8.6  NEUTROABS 5.8 5.9  --   --   --   --  6.5  HGB 15.3* 17.6*   < > 16.5* 14.9 15.3* 15.4*  HCT 45.3 51.4*   < > 49.0* 43.8 45.0 45.7  MCV 92.1 91.8   < > 90.1 90.7  --  90.9  PLT 191 217   < > 208 208  --  208   < > = values in this interval not displayed.   Cardiac Enzymes: No results for input(s): "CKTOTAL", "CKMB", "CKMBINDEX", "TROPONINI" in the last 8760 hours. BNP: Invalid input(s): "POCBNP" Lab Results  Component Value Date   HGBA1C 4.8 05/11/2021   Lab Results  Component Value Date   TSH 3.099 12/12/2022   Lab Results  Component Value Date   VITAMINB12 766 12/12/2022   Lab Results  Component Value Date   FOLATE 17.9 12/12/2022   No results found for: "IRON", "TIBC", "FERRITIN"  Imaging and Procedures obtained prior to SNF admission: CT Head Wo Contrast  Result Date: 02/23/2023 CLINICAL DATA:  Unwitnessed fall, head injury, brain tumor EXAM: CT HEAD WITHOUT CONTRAST CT CERVICAL SPINE WITHOUT CONTRAST TECHNIQUE: Multidetector CT imaging of the head and cervical spine was performed following the standard protocol without intravenous contrast. Multiplanar CT image reconstructions of the cervical spine were also generated. RADIATION DOSE REDUCTION: This exam was performed according to the departmental dose-optimization program which includes automated exposure control, adjustment of the mA and/or kV according to patient size and/or use of iterative reconstruction technique. COMPARISON:  01/30/2023 FINDINGS: CT HEAD FINDINGS  Brain: No evidence of acute infarction, hemorrhage, or hydrocephalus. Unchanged extra-axial mass of the anterior posterior fossa appearing to arise from the clivus measuring 3.1 x 2.7 cm (series 3, image 4). Periventricular and deep white matter hypodensity. Vascular: No hyperdense vessel or unexpected calcification. Skull: Normal. Negative for fracture or focal lesion. Sinuses/Orbits: No acute finding. Other: None. CT CERVICAL SPINE  FINDINGS Alignment: Normal. Skull base and vertebrae: No acute fracture. No primary bone lesion or focal pathologic process. Soft tissues and spinal canal: No prevertebral fluid or swelling. No visible canal hematoma. Disc levels: Moderate multilevel disc space height loss and osteophytosis throughout the cervical spine. Upper chest: Negative. Other: None. IMPRESSION: 1. No acute intracranial pathology. 2. Unchanged extra-axial mass of the anterior posterior fossa appearing to arise from the clivus measuring 3.1 x 2.7 cm, previously characterized as a meningioma. 3. No fracture or static subluxation of the cervical spine. 4. Moderate multilevel cervical disc degenerative disease. Electronically Signed   By: Jearld Lesch M.D.   On: 02/23/2023 11:42   CT Cervical Spine Wo Contrast  Result Date: 02/23/2023 CLINICAL DATA:  Unwitnessed fall, head injury, brain tumor EXAM: CT HEAD WITHOUT CONTRAST CT CERVICAL SPINE WITHOUT CONTRAST TECHNIQUE: Multidetector CT imaging of the head and cervical spine was performed following the standard protocol without intravenous contrast. Multiplanar CT image reconstructions of the cervical spine were also generated. RADIATION DOSE REDUCTION: This exam was performed according to the departmental dose-optimization program which includes automated exposure control, adjustment of the mA and/or kV according to patient size and/or use of iterative reconstruction technique. COMPARISON:  01/30/2023 FINDINGS: CT HEAD FINDINGS Brain: No evidence of acute infarction, hemorrhage, or hydrocephalus. Unchanged extra-axial mass of the anterior posterior fossa appearing to arise from the clivus measuring 3.1 x 2.7 cm (series 3, image 4). Periventricular and deep white matter hypodensity. Vascular: No hyperdense vessel or unexpected calcification. Skull: Normal. Negative for fracture or focal lesion. Sinuses/Orbits: No acute finding. Other: None. CT CERVICAL SPINE FINDINGS Alignment: Normal. Skull  base and vertebrae: No acute fracture. No primary bone lesion or focal pathologic process. Soft tissues and spinal canal: No prevertebral fluid or swelling. No visible canal hematoma. Disc levels: Moderate multilevel disc space height loss and osteophytosis throughout the cervical spine. Upper chest: Negative. Other: None. IMPRESSION: 1. No acute intracranial pathology. 2. Unchanged extra-axial mass of the anterior posterior fossa appearing to arise from the clivus measuring 3.1 x 2.7 cm, previously characterized as a meningioma. 3. No fracture or static subluxation of the cervical spine. 4. Moderate multilevel cervical disc degenerative disease. Electronically Signed   By: Jearld Lesch M.D.   On: 02/23/2023 11:42   DG Pelvis Portable  Result Date: 02/23/2023 CLINICAL DATA:  Unwitnessed fall. EXAM: PORTABLE PELVIS 1-2 VIEWS COMPARISON:  01/30/2023. FINDINGS: Pelvis is intact with normal and symmetric sacroiliac joints. No acute fracture or dislocation. No aggressive osseous lesion. Visualized sacral arcuate lines are unremarkable. Unremarkable symphysis pubis. There are mild degenerative changes of bilateral hip joints. Moderate-to-severe degenerative changes of the visualized lower lumbar spine. No radiopaque foreign bodies. There are surgical staples overlying the pelvis. IMPRESSION: 1. No acute osseous abnormality.  Degenerative joint/disc disease. Electronically Signed   By: Jules Schick M.D.   On: 02/23/2023 10:42   DG Chest Port 1 View  Result Date: 02/23/2023 CLINICAL DATA:  trauma.  Unwitnessed fall. EXAM: PORTABLE CHEST 1 VIEW COMPARISON:  01/30/2023. FINDINGS: Bilateral lung fields are clear. Bilateral costophrenic angles are clear. Stable cardio-mediastinal silhouette. Aortic arch calcifications and  dense mitral annulus calcifications are again seen. No acute osseous abnormalities. The soft tissues are within normal limits. Surgical staples are noted overlying the left breast/axillary region.  IMPRESSION: 1. No active disease. 2. Aortic atherosclerosis. 3. Mitral annulus calcifications. Electronically Signed   By: Jules Schick M.D.   On: 02/23/2023 10:40    Assessment/Plan 1. Frequent falls Continues to be her issue She will not be going back to [place] And staying in SNF now Working with [employer] Her falls are related to Meningioma and Cognitive impairnment  2. Periorbital contusion of right eye, sequela Has Follow up with Ophthalmology She is stable  3. Cognitive impairment Worsening Recent MMSE 19/30  4. Chronic bilateral low back pain without sciatica Tylenol PRn  5. Gait abnormality Working with therapy   6. Meningioma [facility]) Not candidate for any other work up  7. Essential hypertension Low dose of Metoprolol Loose control  8. Pure hypercholesterolemia On statin LDL 70 9. Paroxysmal atrial fibrillation (HCC) On Eliquis and Metoprolol  10. Localized osteoporosis without current pathological fracture Prolia Needs to be given this month 11 Depression On Lexapro 12 Constipation Continue Miralax 13 History of breast cancer In Remission Follow with Oncology PRN Annually Mammogram 14  Lung nodule No Further Follow up needed  Family/ staff Communication:   Labs/tests ordered:

## 2023-03-07 ENCOUNTER — Encounter: Payer: Self-pay | Admitting: Orthopedic Surgery

## 2023-03-07 ENCOUNTER — Non-Acute Institutional Stay (INDEPENDENT_AMBULATORY_CARE_PROVIDER_SITE_OTHER): Payer: Self-pay | Admitting: Orthopedic Surgery

## 2023-03-07 DIAGNOSIS — Z0000 Encounter for general adult medical examination without abnormal findings: Secondary | ICD-10-CM | POA: Diagnosis not present

## 2023-03-07 DIAGNOSIS — R41841 Cognitive communication deficit: Secondary | ICD-10-CM | POA: Diagnosis not present

## 2023-03-07 DIAGNOSIS — R278 Other lack of coordination: Secondary | ICD-10-CM | POA: Diagnosis not present

## 2023-03-07 DIAGNOSIS — R2689 Other abnormalities of gait and mobility: Secondary | ICD-10-CM | POA: Diagnosis not present

## 2023-03-07 DIAGNOSIS — M6389 Disorders of muscle in diseases classified elsewhere, multiple sites: Secondary | ICD-10-CM | POA: Diagnosis not present

## 2023-03-07 DIAGNOSIS — R296 Repeated falls: Secondary | ICD-10-CM | POA: Diagnosis not present

## 2023-03-07 DIAGNOSIS — Z8673 Personal history of transient ischemic attack (TIA), and cerebral infarction without residual deficits: Secondary | ICD-10-CM | POA: Diagnosis not present

## 2023-03-07 DIAGNOSIS — M5459 Other low back pain: Secondary | ICD-10-CM | POA: Diagnosis not present

## 2023-03-07 DIAGNOSIS — M6259 Muscle wasting and atrophy, not elsewhere classified, multiple sites: Secondary | ICD-10-CM | POA: Diagnosis not present

## 2023-03-07 NOTE — Progress Notes (Signed)
Subjective:   Jessica Mullins is a 89 y.o. female who presents for Medicare Annual (Subsequent) preventive examination.  Visit Complete: In person  Patient Medicare AWV questionnaire was completed by the patient on 03/07/2023; I have confirmed that all information answered by patient is correct and no changes since this date.  Review of Systems     Cardiac Risk Factors include: advanced age (>16men, >89 women);sedentary lifestyle;hypertension;dyslipidemia     Objective:    Today's Vitals   03/07/23 1430 03/07/23 1432  BP: 138/89   Pulse: 87   Resp: 18   Temp: (!) 97 F (36.1 C)   SpO2: 99%   Weight: 134 lb 9.6 oz (61.1 kg)   Height: 5\' 1"  (1.549 m)   PainSc:  0-No pain   Body mass index is 25.43 kg/m.     03/01/2023   10:24 AM 02/23/2023    9:50 AM 02/20/2023   12:51 PM 01/31/2023   10:49 AM 01/30/2023    9:12 PM 01/24/2023   10:17 AM 01/03/2023    8:20 AM  Advanced Directives  Does Patient Have a Medical Advance Directive? Yes Yes Yes Yes Yes Yes Yes  Type of Estate agent of [place];Living will;Out of facility DNR (pink MOST or yellow form)  Healthcare Power of [place];Living will;Out of facility DNR (pink MOST or yellow form) Healthcare Power of [place];Living will;Out of facility DNR (pink MOST or yellow form) Healthcare Power of [place];Living will;Out of facility DNR (pink MOST or yellow form) Healthcare Power of [place];Living will;Out of facility DNR (pink MOST or yellow form) Healthcare Power of [place];Living will;Out of facility DNR (pink MOST or yellow form)  Does patient want to make changes to medical advance directive? No - Patient declined  No - Patient declined No - Patient declined No - Patient declined No - Patient declined No - Patient declined  Copy of Healthcare Power of Attorney in Chart? Yes - validated most recent copy scanned in chart (See row information)  Yes - validated most recent copy scanned in chart (See row information)  Yes - validated most recent copy scanned in chart (See row information) Yes - validated most recent copy scanned in chart (See row information), Physician notified Yes - validated most recent copy scanned in chart (See row information) Yes - validated most recent copy scanned in chart (See row information)    Current Medications (verified) Outpatient Encounter Medications as of 03/07/2023  Medication Sig   acetaminophen (TYLENOL) 500 MG tablet Take 1,000 mg by mouth in the morning.   acetaminophen (TYLENOL) 500 MG tablet Take 1,000 mg by mouth 2 (two) times daily as needed for moderate pain.   Biotin 5 MG CAPS Take 5 mg by mouth in the morning.   Calcium Carb-Cholecalciferol (CALCIUM 600+D3) 600-10 MG-MCG TABS Take 1 tablet by mouth in the morning.   Cholecalciferol (VITAMIN D3) 1000 UNITS CAPS Take 1,000 Units by mouth in the morning.   cycloSPORINE (RESTASIS) 0.05 % ophthalmic emulsion Place 1 drop into both eyes 2 (two) times daily.  after applying a hot compress   ELIQUIS 5 MG TABS tablet TAKE ONE TABLET BY MOUTH TWICE DAILY   escitalopram (LEXAPRO) 5 MG tablet Take 10 mg by mouth in the morning.   hydrALAZINE (APRESOLINE) 10 MG tablet Take 10 mg by mouth every 8 (eight) hours as needed.   loperamide (IMODIUM A-D) 2 MG tablet Take 2 mg by mouth 3 (three) times daily as needed for diarrhea or loose stools.  metoprolol succinate (TOPROL XL) 25 MG 24 hr tablet Take 25 mg every morning   Multiple Vitamins-Minerals (MULTIVITAMIN WITH MINERALS) tablet Take 2 tablets by mouth in the morning.   Omega-3 Fatty Acids (FISH OIL PO) Take 1 capsule by mouth in the morning. 350 mg-235 mg-90 mg-597 mg   polyethylene glycol (MIRALAX / GLYCOLAX) 17 g packet Take 17 g by mouth in the morning.   pravastatin (PRAVACHOL) 40 MG tablet TAKE ONE TABLET BY MOUTH ONCE DAILY   sodium chloride (MURO 128) 5 % ophthalmic ointment Place 1 Application into both eyes at bedtime.   Turmeric 500 MG CAPS Take 500 mg by mouth  in the morning.   vitamin B-12 (CYANOCOBALAMIN) 1000 MCG tablet Take 1 tablet (1,000 mcg total) by mouth daily.   No facility-administered encounter medications on file as of 03/07/2023.    Allergies (verified) Sulfa antibiotics, Tape, Wound dressing adhesive, Glucosamine forte [nutritional supplements], and Latex   History: Past Medical History:  Diagnosis Date   Allergy    Ankle fracture, left    Atrial fibrillation (HCC)    Per [facility] New Patient Packet    Breast cancer (HCC) 1115/16   left    Bursitis of right shoulder    Cataract    Family history of cancer    Fracture of right wrist    GERD (gastroesophageal reflux disease)    Hyperlipidemia    Hypertension    Lumbar radiculitis    Per [facility] New Patient Packet    Meningioma [facility])    Per [facility] New Patient Packet    Skin cancer 2000   melanoma and basal cell   Stress fracture    Right Heel, Per [facility] New Patient Packet    TIA (transient ischemic attack) 2022   Vaso vagal episode    during preparation for colonoscopy   Past Surgical History:  Procedure Laterality Date   ABDOMINAL HYSTERECTOMY  1988   TAH/BSO--FIBROIDS   APPENDECTOMY     BREAST LUMPECTOMY     B/L--[facility]   BREAST LUMPECTOMY WITH RADIOACTIVE SEED AND SENTINEL LYMPH NODE BIOPSY Left 06/11/2015   Procedure: LEFT BREAST LUMPECTOMY WITH RADIOACTIVE SEED AND LEFT SENTINEL LYMPH NODE MAPPING;  Surgeon: Harriette Bouillon, MD;  Location: [facility] OR;  Service: General;  Laterality: Left;   CARDIOVERSION N/A 01/11/2022   Procedure: CARDIOVERSION;  Surgeon: Jodelle Red, MD;  Location: [facility] ENDOSCOPY;  Service: Cardiovascular;  Laterality: N/A;   CATARACT EXTRACTION  2015   COLONOSCOPY  2010   DG  BONE DENSITY ([facility] HX)     EYE SURGERY Bilateral    cataracts   fiberadenoma Bilateral 1978, 1980   GANGLION CYST EXCISION     L  hand   Lipiflow procedure     Family History  Problem Relation Age of Onset   Cancer Mother        mets to bone--? primary   Coronary  artery disease Brother        died of M! @ 10   Diabetes Brother    Hyperlipidemia Brother    Hypertension Brother    Heart disease Brother        chf   Heart disease Father 42       MI   Lung cancer Sister 48       former smoker   Diabetes Maternal Aunt    Lung cancer Other    Thrombosis Other        thromboembolism clotting disorder--? father died of ? clot  Hypertension Son    High Cholesterol Son    Social History   Socioeconomic History   Marital status: Widowed    Spouse name: Not on file   Number of children: 2   Years of education: Not on file   Highest education level: Master's degree (e.g., MA, MS, MEng, MEd, [name], [name])  Occupational History   Occupation: Magazine features editor: RETIRED    Comment: retired  Tobacco Use   Smoking status: Never   Smokeless tobacco: Never  Vaping Use   Vaping status: Never Used  Substance and Sexual Activity   Alcohol use: Yes    Comment: rare   Drug use: No   Sexual activity: Never  Other Topics Concern   Not on file  Social History Narrative   Patient is widowed.  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    One son one daughter   1 caffeinated beverage daily   She is right-handed.    She works out everyday to a televised [employer] 30 minute program.   No tobacco or alcohol         Per [facility] New Patient Packet Abstracted on 01/03/2020      Diet: Left blank       Caffeine: Yes      Married, if yes what year: Widowed, married in 1955      Do you live in a house, apartment, assisted living, condo, trailer, ect: Apartment      Is it one or more stories: One stories, one person       Pets: 1 Medical laboratory scientific officer      Current/Past profession: Runner, broadcasting/film/video      Highest level or education completed: MA of Education       Exercise:   Yes             Type and how often: Cardio, chair aerobics, 3-4 times weekly          Living Will: Yes   DNR: Yes   POA/HPOA: Yes      Functional Status:   Do you have difficulty bathing or  dressing yourself? Left blank   Do you have difficulty preparing food or eating? Left blank   Do you have difficulty managing your medications? Left blank   Do you have difficulty managing your finances? Left blank   Do you have difficulty affording your medications? Left blank   Social Determinants of Health   Financial Resource Strain: Low Risk  (03/07/2023)   Overall Financial Resource Strain (CARDIA)    Difficulty of Paying Living Expenses: Not hard at all  Food Insecurity: No Food Insecurity (03/07/2023)   Hunger Vital Sign    Worried About Running Out of Food in the Last Year: Never true    Ran Out of Food in the Last Year: Never true  Transportation Needs: No Transportation Needs (03/07/2023)   PRAPARE - Administrator, Civil Service (Medical): No    Lack of Transportation (Non-Medical): No  Physical Activity: Sufficiently Active (03/07/2023)   Exercise Vital Sign    Days of Exercise per Week: 5 days    Minutes of Exercise per Session: 30 min  Stress: No Stress Concern Present (03/07/2023)   [employer] of Occupational Health - Occupational Stress Questionnaire    Feeling of Stress : Only a little  Social Connections: Socially Isolated (03/07/2023)   Social Connection and Isolation Panel [NHANES]    Frequency of Communication with Friends and Family:  More than three times a week    Frequency of Social Gatherings with Friends and Family: More than three times a week    Attends Religious Services: Never    Database administrator or Organizations: No    Attends Banker Meetings: Never    Marital Status: Widowed    Tobacco Counseling Counseling given: Not Answered   Clinical Intake:  Pre-visit preparation completed: No  Pain : No/denies pain Pain Score: 0-No pain     BMI - recorded: 25.43 Nutritional Status: BMI 25 -29 Overweight Nutritional Risks: None Diabetes: No  How often do you need to have someone help you when you read  instructions, pamphlets, or other written materials from your doctor or pharmacy?: 4 - Often What is the last grade level you completed in school?: Masters degree  Interpreter Needed?: No      Activities of Daily Living    03/07/2023    2:34 PM 12/12/2022   10:00 AM  In your present state of health, do you have any difficulty performing the following activities:  Hearing? 0 0  Vision? 0 0  Difficulty concentrating or making decisions? 1 1  Walking or climbing stairs? 1 0  Dressing or bathing? 1 0  Doing errands, shopping? 1 0  Preparing Food and eating ? Y   Using the Toilet? Y   In the past six months, have you accidently leaked urine? Y   Do you have problems with loss of bowel control? N   Managing your Medications? Y   Managing your Finances? Y   Housekeeping or managing your Housekeeping? Y     Patient Care Team: Mahlon Gammon, MD as PCP - General (Internal Medicine) Croitoru, Rachelle Hora, MD as PCP - Cardiology (Cardiology) Nita Sells, MD as Consulting Physician (Dermatology) Nelson Chimes, MD as Consulting Physician (Ophthalmology) Drema Dallas, DO as Consulting Physician (Neurology) Axel Filler, Larna Daughters, NP as Nurse Practitioner (Hematology and Oncology)  Indicate any recent Medical Services you may have received from other than [facility] providers in the past year (date may be approximate).     Assessment:   This is a routine wellness examination for [place].  Hearing/Vision screen No results found.   Goals Addressed             This Visit's Progress    Maintain healthy lifestyle   On track      Depression Screen    03/07/2023    2:37 PM 02/20/2023   12:50 PM 01/03/2023    8:20 AM 12/20/2022   10:45 AM 11/22/2022   10:48 AM 09/20/2022   10:13 AM 08/22/2022    2:13 PM  PHQ 2/9 Scores  PHQ - 2 Score 0 0 0 0 0 0 0    Fall Risk    03/07/2023    2:38 PM 02/20/2023   12:50 PM 01/24/2023   12:43 PM 01/03/2023    8:20 AM 12/20/2022   10:44 AM  Fall Risk    Falls in the past year? 1 1 1 1 1   Number falls in past yr: 1 0 0 0 0  Injury with Fall? 1 0 0 1 1  Risk for fall due to : History of fall(s);Impaired balance/gait;Impaired mobility History of fall(s);Impaired mobility History of fall(s) History of fall(s);Impaired balance/gait;Impaired mobility Impaired balance/gait;Impaired mobility;History of fall(s)  Follow up Falls evaluation completed;Education provided;Falls prevention discussed Falls evaluation completed Falls evaluation completed;Education provided;Falls prevention discussed Falls evaluation completed Falls evaluation completed    MEDICARE  RISK AT HOME: Medicare Risk at Home Any stairs in or around the home?: No If so, are there any without handrails?: No Home free of loose throw rugs in walkways, pet beds, electrical cords, etc?: Yes Adequate lighting in your home to reduce risk of falls?: Yes Life alert?: No Use of a cane, walker or w/c?: Yes Grab bars in the bathroom?: Yes Shower chair or bench in shower?: Yes Elevated toilet seat or a handicapped toilet?: Yes  TIMED UP AND GO:  Was the test performed?  No    Cognitive Function:    03/07/2023    2:39 PM 11/09/2017    9:49 AM 09/13/2016    3:29 PM  MMSE - Mini Mental State Exam  Not completed: Refused    Orientation to time  5 5  Orientation to Place  5 5  Registration  3 3  Attention/ Calculation  5 5  Recall  2 3  Language- name 2 objects  2 2  Language- repeat  1 1  Language- follow 3 step command  3 3  Language- read & follow direction  1 1  Write a sentence  1 1  Copy design  1 1  Total score  29 30        11/14/2019    9:42 AM  6CIT Screen  What Year? 0 points  What month? 0 points  What time? 0 points  Count back from 20 0 points  Months in reverse 0 points  Repeat phrase 0 points  Total Score 0 points    Immunizations Immunization History  Administered Date(s) Administered   Fluad Quad(high Dose 65+) 02/27/2020, 03/29/2022   H1N1  07/01/2008   Influenza Whole 04/10/2007, 04/01/2009, 03/26/2010, 03/25/2012   Influenza, High Dose Seasonal PF 04/16/2014, 04/09/2015, 04/03/2017, 04/19/2018, 02/28/2019, 03/08/2021   Influenza-Unspecified 06/27/2016, 04/03/2017   Moderna Sars-Covid-2 Vaccination 04/27/2022   PFIZER Comirnaty(Gray Top)Covid-19 Tri-Sucrose Vaccine 01/05/2021   PFIZER(Purple Top)SARS-COV-2 Vaccination 07/18/2019, 08/08/2019, 03/29/2020   Pneumococcal Conjugate-13 05/13/2014   Pneumococcal Polysaccharide-23 08/29/2016   Td 11/05/2003   Tdap 06/28/2011, 09/19/2011, 10/23/2020   Zoster Recombinant(Shingrix) 10/08/2016, 01/03/2017   Zoster, Live 02/24/2010    TDAP status: Up to date  Flu Vaccine status: Due, Education has been provided regarding the importance of this vaccine. Advised may receive this vaccine at local pharmacy or Health Dept. Aware to provide a copy of the vaccination record if obtained from local pharmacy or Health Dept. Verbalized acceptance and understanding.  Pneumococcal vaccine status: Up to date  Covid-19 vaccine status: Completed vaccines  Qualifies for Shingles Vaccine? Yes   Zostavax completed Yes   Shingrix Completed?: Yes  Screening Tests Health Maintenance  Topic Date Due   INFLUENZA VACCINE  01/26/2023   COVID-19 Vaccine (6 - 2023-24 season) 02/26/2023   MAMMOGRAM  07/12/2023   Medicare Annual Wellness (AWV)  03/06/2024   DTaP/Tdap/Td (5 - Td or Tdap) 10/24/2030   Pneumonia Vaccine 24+ Years old  Completed   DEXA SCAN  Completed   Zoster Vaccines- Shingrix  Completed   HPV VACCINES  Aged Out    Health Maintenance  Health Maintenance Due  Topic Date Due   INFLUENZA VACCINE  01/26/2023   COVID-19 Vaccine (6 - 2023-24 season) 02/26/2023    Colorectal cancer screening: No longer required.   Mammogram status: No longer required due to advanced age.  Bone Density status: Completed 06/2021. Results reflect: Bone density results: OSTEOPENIA. Repeat every 2  years.  Lung Cancer Screening: (Low Dose CT Chest  recommended if Age 15-80 years, 20 pack-year currently smoking OR have quit w/in 15years.) does not qualify.   Lung Cancer Screening Referral: No  Additional Screening:  Hepatitis C Screening: does not qualify; Completed   Vision Screening: Recommended annual ophthalmology exams for early detection of glaucoma and other disorders of the eye. Is the patient up to date with their annual eye exam?  Yes  Who is the provider or what is the name of the office in which the patient attends annual eye exams? Cannot recall If pt is not established with a provider, would they like to be referred to a provider to establish care? No .   Dental Screening: Recommended annual dental exams for proper oral hygiene  Diabetic Foot Exam: Diabetic Foot Exam: Completed 03/07/2023  Community Resource Referral / Chronic Care Management: CRR required this visit?  No   CCM required this visit?  No     Plan:     I have personally reviewed and noted the following in the patient's chart:   Medical and social history Use of alcohol, tobacco or illicit drugs  Current medications and supplements including opioid prescriptions. Patient is not currently taking opioid prescriptions. Functional ability and status Nutritional status Physical activity Advanced directives List of other physicians Hospitalizations, surgeries, and ER visits in previous 12 months Vitals Screenings to include cognitive, depression, and falls Referrals and appointments  In addition, I have reviewed and discussed with patient certain preventive protocols, quality metrics, and best practice recommendations. A written personalized care plan for preventive services as well as general preventive health recommendations were provided to patient.     Octavia Heir, NP   03/07/2023   After Visit Summary: (MyChart) Due to this being a telephonic visit, the after visit summary with patients  personalized plan was offered to patient via MyChart   Nurse Notes: UTD on vaccinations, flu/covid offered by [facility] 03/2023. MMSE 19/30 02/27/2023.

## 2023-03-08 DIAGNOSIS — R2689 Other abnormalities of gait and mobility: Secondary | ICD-10-CM | POA: Diagnosis not present

## 2023-03-08 DIAGNOSIS — M6259 Muscle wasting and atrophy, not elsewhere classified, multiple sites: Secondary | ICD-10-CM | POA: Diagnosis not present

## 2023-03-08 DIAGNOSIS — R278 Other lack of coordination: Secondary | ICD-10-CM | POA: Diagnosis not present

## 2023-03-08 DIAGNOSIS — R41841 Cognitive communication deficit: Secondary | ICD-10-CM | POA: Diagnosis not present

## 2023-03-08 DIAGNOSIS — M6389 Disorders of muscle in diseases classified elsewhere, multiple sites: Secondary | ICD-10-CM | POA: Diagnosis not present

## 2023-03-08 DIAGNOSIS — R296 Repeated falls: Secondary | ICD-10-CM | POA: Diagnosis not present

## 2023-03-08 DIAGNOSIS — M5459 Other low back pain: Secondary | ICD-10-CM | POA: Diagnosis not present

## 2023-03-08 DIAGNOSIS — Z8673 Personal history of transient ischemic attack (TIA), and cerebral infarction without residual deficits: Secondary | ICD-10-CM | POA: Diagnosis not present

## 2023-03-09 DIAGNOSIS — M6389 Disorders of muscle in diseases classified elsewhere, multiple sites: Secondary | ICD-10-CM | POA: Diagnosis not present

## 2023-03-09 DIAGNOSIS — M5459 Other low back pain: Secondary | ICD-10-CM | POA: Diagnosis not present

## 2023-03-09 DIAGNOSIS — R2689 Other abnormalities of gait and mobility: Secondary | ICD-10-CM | POA: Diagnosis not present

## 2023-03-09 DIAGNOSIS — R41841 Cognitive communication deficit: Secondary | ICD-10-CM | POA: Diagnosis not present

## 2023-03-09 DIAGNOSIS — R278 Other lack of coordination: Secondary | ICD-10-CM | POA: Diagnosis not present

## 2023-03-09 DIAGNOSIS — Z8673 Personal history of transient ischemic attack (TIA), and cerebral infarction without residual deficits: Secondary | ICD-10-CM | POA: Diagnosis not present

## 2023-03-09 DIAGNOSIS — M6259 Muscle wasting and atrophy, not elsewhere classified, multiple sites: Secondary | ICD-10-CM | POA: Diagnosis not present

## 2023-03-09 DIAGNOSIS — R296 Repeated falls: Secondary | ICD-10-CM | POA: Diagnosis not present

## 2023-03-10 DIAGNOSIS — Z8673 Personal history of transient ischemic attack (TIA), and cerebral infarction without residual deficits: Secondary | ICD-10-CM | POA: Diagnosis not present

## 2023-03-10 DIAGNOSIS — M6389 Disorders of muscle in diseases classified elsewhere, multiple sites: Secondary | ICD-10-CM | POA: Diagnosis not present

## 2023-03-10 DIAGNOSIS — R278 Other lack of coordination: Secondary | ICD-10-CM | POA: Diagnosis not present

## 2023-03-10 DIAGNOSIS — M5459 Other low back pain: Secondary | ICD-10-CM | POA: Diagnosis not present

## 2023-03-10 DIAGNOSIS — R41841 Cognitive communication deficit: Secondary | ICD-10-CM | POA: Diagnosis not present

## 2023-03-13 ENCOUNTER — Encounter: Payer: Self-pay | Admitting: Internal Medicine

## 2023-03-13 DIAGNOSIS — M6389 Disorders of muscle in diseases classified elsewhere, multiple sites: Secondary | ICD-10-CM | POA: Diagnosis not present

## 2023-03-13 DIAGNOSIS — R41841 Cognitive communication deficit: Secondary | ICD-10-CM | POA: Diagnosis not present

## 2023-03-13 DIAGNOSIS — Z8673 Personal history of transient ischemic attack (TIA), and cerebral infarction without residual deficits: Secondary | ICD-10-CM | POA: Diagnosis not present

## 2023-03-13 DIAGNOSIS — M5459 Other low back pain: Secondary | ICD-10-CM | POA: Diagnosis not present

## 2023-03-13 DIAGNOSIS — R278 Other lack of coordination: Secondary | ICD-10-CM | POA: Diagnosis not present

## 2023-03-14 DIAGNOSIS — R41841 Cognitive communication deficit: Secondary | ICD-10-CM | POA: Diagnosis not present

## 2023-03-14 DIAGNOSIS — R278 Other lack of coordination: Secondary | ICD-10-CM | POA: Diagnosis not present

## 2023-03-14 DIAGNOSIS — Z8673 Personal history of transient ischemic attack (TIA), and cerebral infarction without residual deficits: Secondary | ICD-10-CM | POA: Diagnosis not present

## 2023-03-14 DIAGNOSIS — M5459 Other low back pain: Secondary | ICD-10-CM | POA: Diagnosis not present

## 2023-03-14 DIAGNOSIS — M6389 Disorders of muscle in diseases classified elsewhere, multiple sites: Secondary | ICD-10-CM | POA: Diagnosis not present

## 2023-03-15 DIAGNOSIS — M6389 Disorders of muscle in diseases classified elsewhere, multiple sites: Secondary | ICD-10-CM | POA: Diagnosis not present

## 2023-03-15 DIAGNOSIS — Z8673 Personal history of transient ischemic attack (TIA), and cerebral infarction without residual deficits: Secondary | ICD-10-CM | POA: Diagnosis not present

## 2023-03-15 DIAGNOSIS — R278 Other lack of coordination: Secondary | ICD-10-CM | POA: Diagnosis not present

## 2023-03-15 DIAGNOSIS — M5459 Other low back pain: Secondary | ICD-10-CM | POA: Diagnosis not present

## 2023-03-15 DIAGNOSIS — R41841 Cognitive communication deficit: Secondary | ICD-10-CM | POA: Diagnosis not present

## 2023-03-16 ENCOUNTER — Non-Acute Institutional Stay (SKILLED_NURSING_FACILITY): Payer: Medicare PPO | Admitting: Adult Health

## 2023-03-16 ENCOUNTER — Encounter: Payer: Self-pay | Admitting: Adult Health

## 2023-03-16 ENCOUNTER — Ambulatory Visit (INDEPENDENT_AMBULATORY_CARE_PROVIDER_SITE_OTHER): Payer: Medicare PPO

## 2023-03-16 DIAGNOSIS — R0989 Other specified symptoms and signs involving the circulatory and respiratory systems: Secondary | ICD-10-CM | POA: Diagnosis not present

## 2023-03-16 DIAGNOSIS — M6389 Disorders of muscle in diseases classified elsewhere, multiple sites: Secondary | ICD-10-CM | POA: Diagnosis not present

## 2023-03-16 DIAGNOSIS — R278 Other lack of coordination: Secondary | ICD-10-CM | POA: Diagnosis not present

## 2023-03-16 DIAGNOSIS — R41841 Cognitive communication deficit: Secondary | ICD-10-CM | POA: Diagnosis not present

## 2023-03-16 DIAGNOSIS — Z8673 Personal history of transient ischemic attack (TIA), and cerebral infarction without residual deficits: Secondary | ICD-10-CM | POA: Diagnosis not present

## 2023-03-16 DIAGNOSIS — M8580 Other specified disorders of bone density and structure, unspecified site: Secondary | ICD-10-CM | POA: Diagnosis not present

## 2023-03-16 DIAGNOSIS — F329 Major depressive disorder, single episode, unspecified: Secondary | ICD-10-CM | POA: Diagnosis not present

## 2023-03-16 DIAGNOSIS — M5459 Other low back pain: Secondary | ICD-10-CM | POA: Diagnosis not present

## 2023-03-16 MED ORDER — DENOSUMAB 60 MG/ML ~~LOC~~ SOSY
60.0000 mg | PREFILLED_SYRINGE | Freq: Once | SUBCUTANEOUS | Status: AC
Start: 2023-03-16 — End: 2023-03-16
  Administered 2023-03-16: 60 mg via SUBCUTANEOUS

## 2023-03-16 NOTE — Progress Notes (Signed)
Location:  Medical illustrator of Service:  SNF (31) Provider:   Peggye Ley, ANP [facility] [phone]   Mahlon Gammon, MD  Patient Care Team: Mahlon Gammon, MD as PCP - General (Internal Medicine) Thurmon Fair, MD as PCP - Cardiology (Cardiology) Nita Sells, MD as Consulting Physician (Dermatology) Nelson Chimes, MD as Consulting Physician (Ophthalmology) Drema Dallas, DO as Consulting Physician (Neurology) Axel Filler Larna Daughters, NP as Nurse Practitioner (Hematology and Oncology)  Extended Emergency Contact Information Primary Emergency Contact: Ray,Ruth  D[place] of [place] Home Phone: [phone] Mobile Phone: [phone] Relation: Daughter Secondary Emergency Contact: Corliss Marcus [place] of [place] Home Phone: [phone] Relation: Son  Code Status:  DNR Goals of care: Advanced Directive information    03/01/2023   10:24 AM  Advanced Directives  Does Patient Have a Medical Advance Directive? Yes  Type of Estate agent of [place];Living will;Out of facility DNR (pink MOST or yellow form)  Does patient want to make changes to medical advance directive? No - Patient declined  Copy of Healthcare Power of Attorney in Chart? Yes - validated most recent copy scanned in chart (See row information)     Chief Complaint  Patient presents with   Acute Visit    Patient is being seen for acute depression.     HPI:  Pt is a 89 y.o. female seen today for an acute visit for depression.   Ms. Violet resides in skilled rehab and transferred from assisted living due to falls and increased care needs.  She is feeling sad that she is moving to skilled care but denies depression. The nurse reports her daughter would like her evaluated for depression.  She is pleasant with no aggression. No suicidal ideation. Denies appetite loss or difficulty sleeping. She has had tearful episodes.   She also  reports a cough and rhinorhea for 1 day. No fever or sob.   Back ground: PMH significant for CVA, afib, HTN, gait abnormality, HLD, orthostatic hypotension, bradycardia, lumbar radiculitis, meningioma, osteopenia, and breast cancer.   She is working with therapy but needs assistance with walking and transfers.   Dr Everlena Cooper is her neurologist. He saw her in [place] she was having leg paresthesia. MRI recommended  MRI 12/12/22 showed increasing size of the meningioma. No surgical intervention recommended.  Memory loss: worsening over time.  Did poorly in serial 7s and recall on MMSE 22/30 08/21/22 Had trouble spelling world backwards.   Had cardioversion for afib 01/11/22 Had some bradycardia which did improve off cardizem 2nd degree AV block on zio patch July 2023 Afib controlled on Eliquis follows with cardiology on metoprolol  Mammogram 07/11/22 normal  Dexa scan 07/21/21 t score -1.1 on Prolia. Last injection 03/16/23  Meningioma: MRI of the brain 11/04/21 showed stable appearance.   follows with neurosurgery   H/o left Breast Cancer Invasive Ductal Carcinoma diagnosed in 2016 S/p lumpectomy, chemo, radiation  Past Medical History:  Diagnosis Date   Allergy    Ankle fracture, left    Atrial fibrillation (HCC)    Per [facility] New Patient Packet    Breast cancer (HCC) 1115/16   left    Bursitis of right shoulder    Cataract    Family history of cancer    Fracture of right wrist    GERD (gastroesophageal reflux disease)    Hyperlipidemia    Hypertension    Lumbar radiculitis    Per [facility] New Patient Packet  Meningioma [facility])    Per [facility] New Patient Packet    Skin cancer 2000   melanoma and basal cell   Stress fracture    Right Heel, Per [facility] New Patient Packet    TIA (transient ischemic attack) 2022   Vaso vagal episode    during preparation for colonoscopy   Past Surgical History:  Procedure Laterality Date   ABDOMINAL HYSTERECTOMY  1988   TAH/BSO--FIBROIDS   APPENDECTOMY      BREAST LUMPECTOMY     B/L--[facility]   BREAST LUMPECTOMY WITH RADIOACTIVE SEED AND SENTINEL LYMPH NODE BIOPSY Left 06/11/2015   Procedure: LEFT BREAST LUMPECTOMY WITH RADIOACTIVE SEED AND LEFT SENTINEL LYMPH NODE MAPPING;  Surgeon: Harriette Bouillon, MD;  Location: [facility] OR;  Service: General;  Laterality: Left;   CARDIOVERSION N/A 01/11/2022   Procedure: CARDIOVERSION;  Surgeon: Jodelle Red, MD;  Location: [facility] ENDOSCOPY;  Service: Cardiovascular;  Laterality: N/A;   CATARACT EXTRACTION  2015   COLONOSCOPY  2010   DG  BONE DENSITY ([facility] HX)     EYE SURGERY Bilateral    cataracts   fiberadenoma Bilateral 1978, 1980   GANGLION CYST EXCISION     L  hand   Lipiflow procedure      Allergies  Allergen Reactions   Sulfa Antibiotics Other (See Comments)    As a child, became "rigid as a stick and not responsive"   Tape Other (See Comments)    Blisters, Please use "paper" tape only for short periods of time   Wound Dressing Adhesive Other (See Comments)    Blisters, Please use "paper" tape only for short periods of time, Blisters, Please use "paper" tape only for short periods of time   Glucosamine Forte [Nutritional Supplements] Rash   Latex Rash    Outpatient Encounter Medications as of 03/16/2023  Medication Sig   acetaminophen (TYLENOL) 500 MG tablet Take 1,000 mg by mouth in the morning.   acetaminophen (TYLENOL) 500 MG tablet Take 1,000 mg by mouth 2 (two) times daily as needed for moderate pain.   Biotin 5 MG CAPS Take 5 mg by mouth in the morning.   Calcium Carb-Cholecalciferol (CALCIUM 600+D3) 600-10 MG-MCG TABS Take 1 tablet by mouth in the morning.   Cholecalciferol (VITAMIN D3) 1000 UNITS CAPS Take 1,000 Units by mouth in the morning.   cycloSPORINE (RESTASIS) 0.05 % ophthalmic emulsion Place 1 drop into both eyes 2 (two) times daily.  after applying a hot compress   ELIQUIS 5 MG TABS tablet TAKE ONE TABLET BY MOUTH TWICE DAILY   escitalopram (LEXAPRO) 5 MG tablet Take 10 mg  by mouth in the morning.   hydrALAZINE (APRESOLINE) 10 MG tablet Take 10 mg by mouth every 8 (eight) hours as needed.   loperamide (IMODIUM A-D) 2 MG tablet Take 2 mg by mouth 3 (three) times daily as needed for diarrhea or loose stools.   metoprolol succinate (TOPROL XL) 25 MG 24 hr tablet Take 25 mg every morning   Multiple Vitamins-Minerals (MULTIVITAMIN WITH MINERALS) tablet Take 2 tablets by mouth in the morning.   Omega-3 Fatty Acids (FISH OIL PO) Take 1 capsule by mouth in the morning. 350 mg-235 mg-90 mg-597 mg   polyethylene glycol (MIRALAX / GLYCOLAX) 17 g packet Take 17 g by mouth in the morning.   pravastatin (PRAVACHOL) 40 MG tablet TAKE ONE TABLET BY MOUTH ONCE DAILY   sodium chloride (MURO 128) 5 % ophthalmic ointment Place 1 Application into both eyes at bedtime.   Turmeric  500 MG CAPS Take 500 mg by mouth in the morning.   vitamin B-12 (CYANOCOBALAMIN) 1000 MCG tablet Take 1 tablet (1,000 mcg total) by mouth daily.   No facility-administered encounter medications on file as of 03/16/2023.    Review of Systems  Constitutional:  Positive for activity change. Negative for appetite change, chills, diaphoresis, fatigue, fever and unexpected weight change.  HENT:  Positive for rhinorrhea. Negative for congestion.   Respiratory:  Positive for cough. Negative for shortness of breath and wheezing.   Cardiovascular:  Negative for chest pain, palpitations and leg swelling.  Gastrointestinal:  Negative for abdominal distention, abdominal pain, constipation and diarrhea.  Genitourinary:  Negative for difficulty urinating and dysuria.  Musculoskeletal:  Positive for gait problem. Negative for arthralgias, back pain, joint swelling and myalgias.  Neurological:  Negative for dizziness, tremors, seizures, syncope, facial asymmetry, speech difficulty, weakness, light-headedness, numbness and headaches.  Psychiatric/Behavioral:  Positive for confusion. Negative for agitation and behavioral  problems.     Immunization History  Administered Date(s) Administered   Fluad Quad(high Dose 65+) 02/27/2020, 03/29/2022   H1N1 07/01/2008   Influenza Whole 04/10/2007, 04/01/2009, 03/26/2010, 03/25/2012   Influenza, High Dose Seasonal PF 04/16/2014, 04/09/2015, 04/03/2017, 04/19/2018, 02/28/2019, 03/08/2021   Influenza-Unspecified 06/27/2016, 04/03/2017   Moderna Sars-Covid-2 Vaccination 04/27/2022   PFIZER Comirnaty(Gray Top)Covid-19 Tri-Sucrose Vaccine 01/05/2021   PFIZER(Purple Top)SARS-COV-2 Vaccination 07/18/2019, 08/08/2019, 03/29/2020   Pneumococcal Conjugate-13 05/13/2014   Pneumococcal Polysaccharide-23 08/29/2016   Td 11/05/2003   Tdap 06/28/2011, 09/19/2011, 10/23/2020   Zoster Recombinant(Shingrix) 10/08/2016, 01/03/2017   Zoster, Live 02/24/2010   Pertinent  Health Maintenance Due  Topic Date Due   INFLUENZA VACCINE  01/26/2023   MAMMOGRAM  07/12/2023   DEXA SCAN  Completed      12/20/2022   10:44 AM 01/03/2023    8:20 AM 01/24/2023   12:43 PM 02/20/2023   12:50 PM 03/07/2023    2:38 PM  Fall Risk  Falls in the past year? 1 1 1 1 1   Was there an injury with Fall? 1 1 0 0 1  Fall Risk Category Calculator 2 2 1 1 3   Patient at Risk for Falls Due to Impaired balance/gait;Impaired mobility;History of fall(s) History of fall(s);Impaired balance/gait;Impaired mobility History of fall(s) History of fall(s);Impaired mobility History of fall(s);Impaired balance/gait;Impaired mobility  Fall risk Follow up Falls evaluation completed Falls evaluation completed Falls evaluation completed;Education provided;Falls prevention discussed Falls evaluation completed Falls evaluation completed;Education provided;Falls prevention discussed   Functional Status Survey:    Vitals:   03/16/23 1538  BP: 126/88  Pulse: 80  Resp: 18  Temp: 97.8 F (36.6 C)  TempSrc: Temporal  SpO2: 100%  Weight: 134 lb 3.2 oz (60.9 kg)  Height: 5\' 1"  (1.549 m)   Body mass index is 25.36  kg/m. Physical Exam Vitals and nursing note reviewed.  Constitutional:      General: She is not in acute distress.    Appearance: She is not diaphoretic.  HENT:     Head: Normocephalic and atraumatic.     Nose: Congestion present.     Mouth/Throat:     Mouth: Mucous membranes are moist.     Pharynx: Oropharynx is clear.  Eyes:     Conjunctiva/sclera: Conjunctivae normal.     Pupils: Pupils are equal, round, and reactive to light.  Neck:     Vascular: No JVD.  Cardiovascular:     Rate and Rhythm: Normal rate and regular rhythm.     Heart sounds: No murmur heard. Pulmonary:  Effort: Pulmonary effort is normal. No respiratory distress.     Breath sounds: Normal breath sounds. No wheezing.  Abdominal:     General: Bowel sounds are normal. There is no distension.     Palpations: Abdomen is soft.     Tenderness: There is no abdominal tenderness.  Skin:    General: Skin is warm and dry.  Neurological:     General: No focal deficit present.     Mental Status: She is alert. Mental status is at baseline.  Psychiatric:        Mood and Affect: Mood normal.     Labs reviewed: Recent Labs    12/04/22 1906 12/11/22 1937 01/04/23 0000 01/30/23 2123 01/30/23 2133 02/23/23 0959  NA 141   < > 141 137 138 140  K 4.1   < > 4.1 4.0 3.8 3.7  CL 106   < > 104 102 103 101  CO2 26   < > 25* 26  --  31  GLUCOSE 96   < >  --  125* 123* 76  BUN 28*   < > 16 31* 32* 15  CREATININE 1.06*   < > 0.8 1.03* 1.00 0.94  CALCIUM 9.6   < > 9.5 10.0  --  9.8  MG 2.5*  --   --   --   --   --    < > = values in this interval not displayed.   Recent Labs    12/11/22 1937 01/30/23 2123 02/23/23 0959  AST 22 28 24   ALT 18 18 15   ALKPHOS 64 55 50  BILITOT 0.9 1.2 1.3*  PROT 7.1 5.7* 5.8*  ALBUMIN 4.2 3.5 3.5   Recent Labs    12/04/22 1906 12/11/22 1937 12/12/22 0859 12/13/22 0411 01/30/23 2123 01/30/23 2133 02/23/23 0959  WBC 8.5 9.1   < > 8.4 8.8  --  8.6  NEUTROABS 5.8 5.9  --    --   --   --  6.5  HGB 15.3* 17.6*   < > 16.5* 14.9 15.3* 15.4*  HCT 45.3 51.4*   < > 49.0* 43.8 45.0 45.7  MCV 92.1 91.8   < > 90.1 90.7  --  90.9  PLT 191 217   < > 208 208  --  208   < > = values in this interval not displayed.   Lab Results  Component Value Date   TSH 3.099 12/12/2022   Lab Results  Component Value Date   HGBA1C 4.8 05/11/2021   Lab Results  Component Value Date   CHOL 152 08/04/2022   HDL 63 08/04/2022   LDLCALC 70 08/04/2022   LDLDIRECT 153.4 11/16/2009   TRIG 91 08/04/2022   CHOLHDL 2.5 05/11/2021    Significant Diagnostic Results in last 30 days:  CT Head Wo Contrast  Result Date: 02/23/2023 CLINICAL DATA:  Unwitnessed fall, head injury, brain tumor EXAM: CT HEAD WITHOUT CONTRAST CT CERVICAL SPINE WITHOUT CONTRAST TECHNIQUE: Multidetector CT imaging of the head and cervical spine was performed following the standard protocol without intravenous contrast. Multiplanar CT image reconstructions of the cervical spine were also generated. RADIATION DOSE REDUCTION: This exam was performed according to the departmental dose-optimization program which includes automated exposure control, adjustment of the mA and/or kV according to patient size and/or use of iterative reconstruction technique. COMPARISON:  01/30/2023 FINDINGS: CT HEAD FINDINGS Brain: No evidence of acute infarction, hemorrhage, or hydrocephalus. Unchanged extra-axial mass of the anterior posterior fossa appearing to arise  from the clivus measuring 3.1 x 2.7 cm (series 3, image 4). Periventricular and deep white matter hypodensity. Vascular: No hyperdense vessel or unexpected calcification. Skull: Normal. Negative for fracture or focal lesion. Sinuses/Orbits: No acute finding. Other: None. CT CERVICAL SPINE FINDINGS Alignment: Normal. Skull base and vertebrae: No acute fracture. No primary bone lesion or focal pathologic process. Soft tissues and spinal canal: No prevertebral fluid or swelling. No visible  canal hematoma. Disc levels: Moderate multilevel disc space height loss and osteophytosis throughout the cervical spine. Upper chest: Negative. Other: None. IMPRESSION: 1. No acute intracranial pathology. 2. Unchanged extra-axial mass of the anterior posterior fossa appearing to arise from the clivus measuring 3.1 x 2.7 cm, previously characterized as a meningioma. 3. No fracture or static subluxation of the cervical spine. 4. Moderate multilevel cervical disc degenerative disease. Electronically Signed   By: Jearld Lesch M.D.   On: 02/23/2023 11:42   CT Cervical Spine Wo Contrast  Result Date: 02/23/2023 CLINICAL DATA:  Unwitnessed fall, head injury, brain tumor EXAM: CT HEAD WITHOUT CONTRAST CT CERVICAL SPINE WITHOUT CONTRAST TECHNIQUE: Multidetector CT imaging of the head and cervical spine was performed following the standard protocol without intravenous contrast. Multiplanar CT image reconstructions of the cervical spine were also generated. RADIATION DOSE REDUCTION: This exam was performed according to the departmental dose-optimization program which includes automated exposure control, adjustment of the mA and/or kV according to patient size and/or use of iterative reconstruction technique. COMPARISON:  01/30/2023 FINDINGS: CT HEAD FINDINGS Brain: No evidence of acute infarction, hemorrhage, or hydrocephalus. Unchanged extra-axial mass of the anterior posterior fossa appearing to arise from the clivus measuring 3.1 x 2.7 cm (series 3, image 4). Periventricular and deep white matter hypodensity. Vascular: No hyperdense vessel or unexpected calcification. Skull: Normal. Negative for fracture or focal lesion. Sinuses/Orbits: No acute finding. Other: None. CT CERVICAL SPINE FINDINGS Alignment: Normal. Skull base and vertebrae: No acute fracture. No primary bone lesion or focal pathologic process. Soft tissues and spinal canal: No prevertebral fluid or swelling. No visible canal hematoma. Disc levels:  Moderate multilevel disc space height loss and osteophytosis throughout the cervical spine. Upper chest: Negative. Other: None. IMPRESSION: 1. No acute intracranial pathology. 2. Unchanged extra-axial mass of the anterior posterior fossa appearing to arise from the clivus measuring 3.1 x 2.7 cm, previously characterized as a meningioma. 3. No fracture or static subluxation of the cervical spine. 4. Moderate multilevel cervical disc degenerative disease. Electronically Signed   By: Jearld Lesch M.D.   On: 02/23/2023 11:42   DG Pelvis Portable  Result Date: 02/23/2023 CLINICAL DATA:  Unwitnessed fall. EXAM: PORTABLE PELVIS 1-2 VIEWS COMPARISON:  01/30/2023. FINDINGS: Pelvis is intact with normal and symmetric sacroiliac joints. No acute fracture or dislocation. No aggressive osseous lesion. Visualized sacral arcuate lines are unremarkable. Unremarkable symphysis pubis. There are mild degenerative changes of bilateral hip joints. Moderate-to-severe degenerative changes of the visualized lower lumbar spine. No radiopaque foreign bodies. There are surgical staples overlying the pelvis. IMPRESSION: 1. No acute osseous abnormality.  Degenerative joint/disc disease. Electronically Signed   By: Jules Schick M.D.   On: 02/23/2023 10:42   DG Chest Port 1 View  Result Date: 02/23/2023 CLINICAL DATA:  trauma.  Unwitnessed fall. EXAM: PORTABLE CHEST 1 VIEW COMPARISON:  01/30/2023. FINDINGS: Bilateral lung fields are clear. Bilateral costophrenic angles are clear. Stable cardio-mediastinal silhouette. Aortic arch calcifications and dense mitral annulus calcifications are again seen. No acute osseous abnormalities. The soft tissues are within normal limits. Surgical staples  are noted overlying the left breast/axillary region. IMPRESSION: 1. No active disease. 2. Aortic atherosclerosis. 3. Mitral annulus calcifications. Electronically Signed   By: Jules Schick M.D.   On: 02/23/2023 10:40    Assessment/Plan 1.  Reactive depression Increase lexapro to 20 mg due to feelings of depression from moving to skilled care.   2. Runny nose Rapid covid and flu negative If worsening check PCR    Family/ staff Communication: nurse  Labs/tests ordered:  NA

## 2023-03-16 NOTE — Progress Notes (Signed)
  Location:  Medical illustrator of Service:  SNF (31) Provider:  Tamsen Roers, MD  Patient Care Team: Mahlon Gammon, MD as PCP - General (Internal Medicine) Thurmon Fair, MD as PCP - Cardiology (Cardiology) Nita Sells, MD as Consulting Physician (Dermatology) Nelson Chimes, MD as Consulting Physician (Ophthalmology) Drema Dallas, DO as Consulting Physician (Neurology) Axel Filler Larna Daughters, NP as Nurse Practitioner (Hematology and Oncology)  Extended Emergency Contact Information Primary Emergency Contact: Ray,Ruth  [place] of [place] Home Phone: [phone] Mobile Phone: [phone] Relation: Daughter Secondary Emergency Contact: Corliss Marcus [place] of [place] Home Phone: [phone] Relation: Son  Code Status:  DNR Goals of care: Advanced Directive information    03/01/2023   10:24 AM  Advanced Directives  Does Patient Have a Medical Advance Directive? Yes  Type of Estate agent of [place];Living will;Out of facility DNR (pink MOST or yellow form)  Does patient want to make changes to medical advance directive? No - Patient declined  Copy of Healthcare Power of Attorney in Chart? Yes - validated most recent copy scanned in chart (See row information)

## 2023-03-17 DIAGNOSIS — R41841 Cognitive communication deficit: Secondary | ICD-10-CM | POA: Diagnosis not present

## 2023-03-17 DIAGNOSIS — M6389 Disorders of muscle in diseases classified elsewhere, multiple sites: Secondary | ICD-10-CM | POA: Diagnosis not present

## 2023-03-17 DIAGNOSIS — M5459 Other low back pain: Secondary | ICD-10-CM | POA: Diagnosis not present

## 2023-03-17 DIAGNOSIS — R278 Other lack of coordination: Secondary | ICD-10-CM | POA: Diagnosis not present

## 2023-03-17 DIAGNOSIS — Z8673 Personal history of transient ischemic attack (TIA), and cerebral infarction without residual deficits: Secondary | ICD-10-CM | POA: Diagnosis not present

## 2023-03-19 DIAGNOSIS — R41841 Cognitive communication deficit: Secondary | ICD-10-CM | POA: Diagnosis not present

## 2023-03-19 DIAGNOSIS — Z8673 Personal history of transient ischemic attack (TIA), and cerebral infarction without residual deficits: Secondary | ICD-10-CM | POA: Diagnosis not present

## 2023-03-20 DIAGNOSIS — R278 Other lack of coordination: Secondary | ICD-10-CM | POA: Diagnosis not present

## 2023-03-20 DIAGNOSIS — Z8673 Personal history of transient ischemic attack (TIA), and cerebral infarction without residual deficits: Secondary | ICD-10-CM | POA: Diagnosis not present

## 2023-03-20 DIAGNOSIS — R41841 Cognitive communication deficit: Secondary | ICD-10-CM | POA: Diagnosis not present

## 2023-03-20 DIAGNOSIS — M6389 Disorders of muscle in diseases classified elsewhere, multiple sites: Secondary | ICD-10-CM | POA: Diagnosis not present

## 2023-03-20 DIAGNOSIS — M5459 Other low back pain: Secondary | ICD-10-CM | POA: Diagnosis not present

## 2023-03-21 DIAGNOSIS — M6389 Disorders of muscle in diseases classified elsewhere, multiple sites: Secondary | ICD-10-CM | POA: Diagnosis not present

## 2023-03-21 DIAGNOSIS — R278 Other lack of coordination: Secondary | ICD-10-CM | POA: Diagnosis not present

## 2023-03-21 DIAGNOSIS — M5459 Other low back pain: Secondary | ICD-10-CM | POA: Diagnosis not present

## 2023-03-22 DIAGNOSIS — R41841 Cognitive communication deficit: Secondary | ICD-10-CM | POA: Diagnosis not present

## 2023-03-22 DIAGNOSIS — R278 Other lack of coordination: Secondary | ICD-10-CM | POA: Diagnosis not present

## 2023-03-22 DIAGNOSIS — M6389 Disorders of muscle in diseases classified elsewhere, multiple sites: Secondary | ICD-10-CM | POA: Diagnosis not present

## 2023-03-22 DIAGNOSIS — Z8673 Personal history of transient ischemic attack (TIA), and cerebral infarction without residual deficits: Secondary | ICD-10-CM | POA: Diagnosis not present

## 2023-03-22 DIAGNOSIS — M5459 Other low back pain: Secondary | ICD-10-CM | POA: Diagnosis not present

## 2023-03-23 DIAGNOSIS — R278 Other lack of coordination: Secondary | ICD-10-CM | POA: Diagnosis not present

## 2023-03-23 DIAGNOSIS — R41841 Cognitive communication deficit: Secondary | ICD-10-CM | POA: Diagnosis not present

## 2023-03-23 DIAGNOSIS — M6389 Disorders of muscle in diseases classified elsewhere, multiple sites: Secondary | ICD-10-CM | POA: Diagnosis not present

## 2023-03-23 DIAGNOSIS — M5459 Other low back pain: Secondary | ICD-10-CM | POA: Diagnosis not present

## 2023-03-23 DIAGNOSIS — Z8673 Personal history of transient ischemic attack (TIA), and cerebral infarction without residual deficits: Secondary | ICD-10-CM | POA: Diagnosis not present

## 2023-03-23 DIAGNOSIS — Z79899 Other long term (current) drug therapy: Secondary | ICD-10-CM | POA: Diagnosis not present

## 2023-03-23 LAB — BASIC METABOLIC PANEL
BUN: 18 (ref 4–21)
CO2: 26 — AB (ref 13–22)
Chloride: 106 (ref 99–108)
Creatinine: 0.8 (ref 0.5–1.1)
Glucose: 84
Potassium: 4.7 meq/L (ref 3.5–5.1)
Sodium: 144 (ref 137–147)

## 2023-03-23 LAB — COMPREHENSIVE METABOLIC PANEL: Calcium: 9.8 (ref 8.7–10.7)

## 2023-03-24 DIAGNOSIS — M6389 Disorders of muscle in diseases classified elsewhere, multiple sites: Secondary | ICD-10-CM | POA: Diagnosis not present

## 2023-03-24 DIAGNOSIS — M5459 Other low back pain: Secondary | ICD-10-CM | POA: Diagnosis not present

## 2023-03-24 DIAGNOSIS — Z8673 Personal history of transient ischemic attack (TIA), and cerebral infarction without residual deficits: Secondary | ICD-10-CM | POA: Diagnosis not present

## 2023-03-24 DIAGNOSIS — R41841 Cognitive communication deficit: Secondary | ICD-10-CM | POA: Diagnosis not present

## 2023-03-24 DIAGNOSIS — R278 Other lack of coordination: Secondary | ICD-10-CM | POA: Diagnosis not present

## 2023-03-27 ENCOUNTER — Non-Acute Institutional Stay: Payer: Medicare PPO | Admitting: Adult Health

## 2023-03-27 DIAGNOSIS — Z8673 Personal history of transient ischemic attack (TIA), and cerebral infarction without residual deficits: Secondary | ICD-10-CM | POA: Diagnosis not present

## 2023-03-27 DIAGNOSIS — R41841 Cognitive communication deficit: Secondary | ICD-10-CM | POA: Diagnosis not present

## 2023-03-27 DIAGNOSIS — M5459 Other low back pain: Secondary | ICD-10-CM | POA: Diagnosis not present

## 2023-03-27 DIAGNOSIS — M6389 Disorders of muscle in diseases classified elsewhere, multiple sites: Secondary | ICD-10-CM | POA: Diagnosis not present

## 2023-03-27 DIAGNOSIS — R278 Other lack of coordination: Secondary | ICD-10-CM | POA: Diagnosis not present

## 2023-03-28 ENCOUNTER — Non-Acute Institutional Stay (SKILLED_NURSING_FACILITY): Payer: Self-pay | Admitting: Orthopedic Surgery

## 2023-03-28 ENCOUNTER — Encounter: Payer: Self-pay | Admitting: Orthopedic Surgery

## 2023-03-28 DIAGNOSIS — M150 Primary generalized (osteo)arthritis: Secondary | ICD-10-CM | POA: Diagnosis not present

## 2023-03-28 DIAGNOSIS — K644 Residual hemorrhoidal skin tags: Secondary | ICD-10-CM | POA: Diagnosis not present

## 2023-03-28 DIAGNOSIS — K5909 Other constipation: Secondary | ICD-10-CM

## 2023-03-28 DIAGNOSIS — R41841 Cognitive communication deficit: Secondary | ICD-10-CM | POA: Diagnosis not present

## 2023-03-28 DIAGNOSIS — M5459 Other low back pain: Secondary | ICD-10-CM | POA: Diagnosis not present

## 2023-03-28 DIAGNOSIS — R278 Other lack of coordination: Secondary | ICD-10-CM | POA: Diagnosis not present

## 2023-03-28 DIAGNOSIS — Z8673 Personal history of transient ischemic attack (TIA), and cerebral infarction without residual deficits: Secondary | ICD-10-CM | POA: Diagnosis not present

## 2023-03-28 MED ORDER — LIDOCAINE 4 % EX PTCH
1.0000 | MEDICATED_PATCH | CUTANEOUS | Status: DC
Start: 2023-03-28 — End: 2023-06-21

## 2023-03-28 MED ORDER — SENNA 8.6 MG PO TABS: 2.0000 | ORAL_TABLET | Freq: Every day | ORAL | Status: DC

## 2023-03-28 NOTE — Progress Notes (Signed)
Location:  [facility] Room Number: 154/A Place of Service:  SNF [phone]) Provider:  Octavia Heir, NP   Mahlon Gammon, MD  Patient Care Team: Mahlon Gammon, MD as PCP - General (Internal Medicine) Thurmon Fair, MD as PCP - Cardiology (Cardiology) Nita Sells, MD as Consulting Physician (Dermatology) Nelson Chimes, MD as Consulting Physician (Ophthalmology) Drema Dallas, DO as Consulting Physician (Neurology) Axel Filler Larna Daughters, NP as Nurse Practitioner (Hematology and Oncology)  Extended Emergency Contact Information Primary Emergency Contact: Ray,Ruth  [place] of [place] Home Phone: [phone] Mobile Phone: [phone] Relation: Daughter Secondary Emergency Contact: Corliss [place] of [place] Home Phone: [phone] Relation: Son  Code Status:  DNR Goals of care: Advanced Directive information    03/01/2023   10:24 AM  Advanced Directives  Does Patient Have a Medical Advance Directive? Yes  Type of Estate agent of [place];Living will;Out of facility DNR (pink MOST or yellow form)  Does patient want to make changes to medical advance directive? No - Patient declined  Copy of Healthcare Power of Attorney in Chart? Yes - validated most recent copy scanned in chart (See row information)     Chief Complaint  Patient presents with   Acute Visit    HPI:  Pt is a 89 y.o. female seen today for acute visit due to blood in stool.   She currently resides on the rehab unit at [facility] due to weakness. PMH: PAF, HTN, HLD, left breast cancer, meningioma, osteopenia, and gait abnormality.   Resident self reported bloody stool 03/27/2023. Nursing reports  disimpaction due to hard stools 03/26/2023. Poor historian due to dementia and poor vision. She confirms tenderness on examination over LLQ. She is followed by Dr. Elberta Leatherwood for chronic constipation, rectocele and hemorrhoids. Taking miralax  daily.   Continues to have generalized pain every morning. Shoulders, lower back and knees most bothersome. Every morning pain is in different location. She is given tylenol 1000 mg every AM, but nursing does not believes it is helping.    Past Medical History:  Diagnosis Date   Allergy    Ankle fracture, left    Atrial fibrillation (HCC)    Per [facility] New Patient Packet    Breast cancer (HCC) 1115/16   left    Bursitis of right shoulder    Cataract    Family history of cancer    Fracture of right wrist    GERD (gastroesophageal reflux disease)    Hyperlipidemia    Hypertension    Lumbar radiculitis    Per [facility] New Patient Packet    Meningioma [facility])    Per [facility] New Patient Packet    Skin cancer 2000   melanoma and basal cell   Stress fracture    Right Heel, Per [facility] New Patient Packet    TIA (transient ischemic attack) 2022   Vaso vagal episode    during preparation for colonoscopy   Past Surgical History:  Procedure Laterality Date   ABDOMINAL HYSTERECTOMY  1988   TAH/BSO--FIBROIDS   APPENDECTOMY     BREAST LUMPECTOMY     B/L--[facility]   BREAST LUMPECTOMY WITH RADIOACTIVE SEED AND SENTINEL LYMPH NODE BIOPSY Left 06/11/2015   Procedure: LEFT BREAST LUMPECTOMY WITH RADIOACTIVE SEED AND LEFT SENTINEL LYMPH NODE MAPPING;  Surgeon: Harriette Bouillon, MD;  Location: [facility] OR;  Service: General;  Laterality: Left;   CARDIOVERSION N/A 01/11/2022   Procedure: CARDIOVERSION;  Surgeon: Jodelle Red, MD;  Location: [facility] ENDOSCOPY;  Service: Cardiovascular;  Laterality: N/A;   CATARACT EXTRACTION  2015   COLONOSCOPY  2010   DG  BONE DENSITY ([facility] HX)     EYE SURGERY Bilateral    cataracts   fiberadenoma Bilateral 1978, 1980   GANGLION CYST EXCISION     L  hand   Lipiflow procedure      Allergies  Allergen Reactions   Sulfa Antibiotics Other (See Comments)    As a child, became "rigid as a stick and not responsive"   Tape Other (See Comments)    Blisters, Please use "paper" tape  only for short periods of time   Wound Dressing Adhesive Other (See Comments)    Blisters, Please use "paper" tape only for short periods of time, Blisters, Please use "paper" tape only for short periods of time   Glucosamine Forte [Nutritional Supplements] Rash   Latex Rash    Outpatient Encounter Medications as of 03/28/2023  Medication Sig   acetaminophen (TYLENOL) 500 MG tablet Take 1,000 mg by mouth in the morning.   acetaminophen (TYLENOL) 500 MG tablet Take 1,000 mg by mouth 2 (two) times daily as needed for moderate pain.   Biotin 5 MG CAPS Take 5 mg by mouth in the morning.   Calcium Carb-Cholecalciferol (CALCIUM 600+D3) 600-10 MG-MCG TABS Take 1 tablet by mouth in the morning.   Cholecalciferol (VITAMIN D3) 1000 UNITS CAPS Take 1,000 Units by mouth in the morning.   cycloSPORINE (RESTASIS) 0.05 % ophthalmic emulsion Place 1 drop into both eyes 2 (two) times daily.  after applying a hot compress   ELIQUIS 5 MG TABS tablet TAKE ONE TABLET BY MOUTH TWICE DAILY   escitalopram (LEXAPRO) 5 MG tablet Take 20 mg by mouth in the morning.   hydrALAZINE (APRESOLINE) 10 MG tablet Take 10 mg by mouth every 8 (eight) hours as needed.   loperamide (IMODIUM A-D) 2 MG tablet Take 2 mg by mouth 3 (three) times daily as needed for diarrhea or loose stools.   metoprolol succinate (TOPROL XL) 25 MG 24 hr tablet Take 25 mg every morning   Multiple Vitamins-Minerals (MULTIVITAMIN WITH MINERALS) tablet Take 2 tablets by mouth in the morning.   Omega-3 Fatty Acids (FISH OIL PO) Take 1 capsule by mouth in the morning. 350 mg-235 mg-90 mg-597 mg   polyethylene glycol (MIRALAX / GLYCOLAX) 17 g packet Take 17 g by mouth in the morning.   pravastatin (PRAVACHOL) 40 MG tablet TAKE ONE TABLET BY MOUTH ONCE DAILY   sodium chloride (MURO 128) 5 % ophthalmic ointment Place 1 Application into both eyes at bedtime.   Turmeric 500 MG CAPS Take 500 mg by mouth in the morning.   vitamin B-12 (CYANOCOBALAMIN) 1000 MCG  tablet Take 1 tablet (1,000 mcg total) by mouth daily.   No facility-administered encounter medications on file as of 03/28/2023.    Review of Systems  Unable to perform ROS: Dementia    Immunization History  Administered Date(s) Administered   Fluad Quad(high Dose 65+) 02/27/2020, 03/29/2022   H1N1 07/01/2008   Influenza Whole 04/10/2007, 04/01/2009, 03/26/2010, 03/25/2012   Influenza, High Dose Seasonal PF 04/16/2014, 04/09/2015, 04/03/2017, 04/19/2018, 02/28/2019, 03/08/2021   Influenza-Unspecified 06/27/2016, 04/03/2017   Moderna Sars-Covid-2 Vaccination 04/27/2022   PFIZER Comirnaty(Gray Top)Covid-19 Tri-Sucrose Vaccine 01/05/2021   PFIZER(Purple Top)SARS-COV-2 Vaccination 07/18/2019, 08/08/2019, 03/29/2020   Pneumococcal Conjugate-13 05/13/2014   Pneumococcal Polysaccharide-23 08/29/2016   Td 11/05/2003   Tdap 06/28/2011, 09/19/2011, 10/23/2020   Zoster Recombinant(Shingrix) 10/08/2016, 01/03/2017   Zoster, Live 02/24/2010  Pertinent  Health Maintenance Due  Topic Date Due   INFLUENZA VACCINE  01/26/2023   MAMMOGRAM  07/12/2023   DEXA SCAN  Completed      12/20/2022   10:44 AM 01/03/2023    8:20 AM 01/24/2023   12:43 PM 02/20/2023   12:50 PM 03/07/2023    2:38 PM  Fall Risk  Falls in the past year? 1 1 1 1 1   Was there an injury with Fall? 1 1 0 0 1  Fall Risk Category Calculator 2 2 1 1 3   Patient at Risk for Falls Due to Impaired balance/gait;Impaired mobility;History of fall(s) History of fall(s);Impaired balance/gait;Impaired mobility History of fall(s) History of fall(s);Impaired mobility History of fall(s);Impaired balance/gait;Impaired mobility  Fall risk Follow up Falls evaluation completed Falls evaluation completed Falls evaluation completed;Education provided;Falls prevention discussed Falls evaluation completed Falls evaluation completed;Education provided;Falls prevention discussed   Functional Status Survey:    Vitals:   03/28/23 1330  BP: (!) 150/90   Pulse: 77  Resp: 14  Temp: 97.9 F (36.6 C)  SpO2: 98%  Weight: 133 lb 3.2 oz (60.4 kg)  Height: 4\' 8"  (1.422 m)   Body mass index is 29.86 kg/m. Physical Exam Vitals reviewed.  Constitutional:      General: She is not in acute distress. HENT:     Head: Normocephalic.  Eyes:     General:        Right eye: No discharge.        Left eye: No discharge.  Cardiovascular:     Rate and Rhythm: Normal rate. Rhythm irregular.     Pulses: Normal pulses.     Heart sounds: Normal heart sounds.  Pulmonary:     Effort: Pulmonary effort is normal. No respiratory distress.     Breath sounds: Normal breath sounds. No wheezing or rales.  Abdominal:     General: Bowel sounds are normal.     Palpations: Abdomen is soft.     Tenderness: There is abdominal tenderness.     Comments: LLQ tenderness  Genitourinary:    Rectum: External hemorrhoid and internal hemorrhoid present.  Musculoskeletal:     Cervical back: Neck supple.     Right lower leg: No edema.     Left lower leg: No edema.  Skin:    General: Skin is warm.     Capillary Refill: Capillary refill takes less than 2 seconds.  Neurological:     General: No focal deficit present.     Mental Status: She is alert. Mental status is at baseline.     Motor: Weakness present.     Gait: Gait abnormal.  Psychiatric:        Mood and Affect: Mood normal.     Comments: Follows commands, alert to self/familiar face     Labs reviewed: Recent Labs    12/04/22 1906 12/11/22 1937 01/04/23 0000 01/30/23 2123 01/30/23 2133 02/23/23 0959  NA 141   < > 141 137 138 140  K 4.1   < > 4.1 4.0 3.8 3.7  CL 106   < > 104 102 103 101  CO2 26   < > 25* 26  --  31  GLUCOSE 96   < >  --  125* 123* 76  BUN 28*   < > 16 31* 32* 15  CREATININE 1.06*   < > 0.8 1.03* 1.00 0.94  CALCIUM 9.6   < > 9.5 10.0  --  9.8  MG 2.5*  --   --   --   --   --    < > =  values in this interval not displayed.   Recent Labs    12/11/22 1937 01/30/23 2123  02/23/23 0959  AST 22 28 24   ALT 18 18 15   ALKPHOS 64 55 50  BILITOT 0.9 1.2 1.3*  PROT 7.1 5.7* 5.8*  ALBUMIN 4.2 3.5 3.5   Recent Labs    12/04/22 1906 12/11/22 1937 12/12/22 0859 12/13/22 0411 01/30/23 2123 01/30/23 2133 02/23/23 0959  WBC 8.5 9.1   < > 8.4 8.8  --  8.6  NEUTROABS 5.8 5.9  --   --   --   --  6.5  HGB 15.3* 17.6*   < > 16.5* 14.9 15.3* 15.4*  HCT 45.3 51.4*   < > 49.0* 43.8 45.0 45.7  MCV 92.1 91.8   < > 90.1 90.7  --  90.9  PLT 191 217   < > 208 208  --  208   < > = values in this interval not displayed.   Lab Results  Component Value Date   TSH 3.099 12/12/2022   Lab Results  Component Value Date   HGBA1C 4.8 05/11/2021   Lab Results  Component Value Date   CHOL 152 08/04/2022   HDL 63 08/04/2022   LDLCALC 70 08/04/2022   LDLDIRECT 153.4 11/16/2009   TRIG 91 08/04/2022   CHOLHDL 2.5 05/11/2021    Significant Diagnostic Results in last 30 days:  No results found.  Assessment/Plan 1. External hemorrhoid - 09/29 hard stools  - external hemorrhoid present on exam - suspect reason for blood in stool  - start senna- 2 tablets po qhs  2. Chronic constipation - ongoing - followed by Dr. Elberta Leatherwood - abdomen soft, BS + x 4, LLQ tenderness - gives extra dose miralax once this afternoon  3. Primary osteoarthritis involving multiple joints - ongoing - shoulders, low back and knees most bothersome - cont scheduled tylenol - start lidocaine 4% patch daily- ask patient where to place    Family/ staff Communication: plan discussed with patient and nurse  Labs/tests ordered:  none

## 2023-03-29 DIAGNOSIS — M5459 Other low back pain: Secondary | ICD-10-CM | POA: Diagnosis not present

## 2023-03-29 DIAGNOSIS — R278 Other lack of coordination: Secondary | ICD-10-CM | POA: Diagnosis not present

## 2023-03-29 DIAGNOSIS — Z8673 Personal history of transient ischemic attack (TIA), and cerebral infarction without residual deficits: Secondary | ICD-10-CM | POA: Diagnosis not present

## 2023-03-29 DIAGNOSIS — R41841 Cognitive communication deficit: Secondary | ICD-10-CM | POA: Diagnosis not present

## 2023-03-30 DIAGNOSIS — R278 Other lack of coordination: Secondary | ICD-10-CM | POA: Diagnosis not present

## 2023-03-30 DIAGNOSIS — Z8673 Personal history of transient ischemic attack (TIA), and cerebral infarction without residual deficits: Secondary | ICD-10-CM | POA: Diagnosis not present

## 2023-03-30 DIAGNOSIS — M5459 Other low back pain: Secondary | ICD-10-CM | POA: Diagnosis not present

## 2023-03-30 DIAGNOSIS — R41841 Cognitive communication deficit: Secondary | ICD-10-CM | POA: Diagnosis not present

## 2023-03-30 NOTE — Progress Notes (Signed)
This encounter was created in error - please disregard.

## 2023-03-31 DIAGNOSIS — R278 Other lack of coordination: Secondary | ICD-10-CM | POA: Diagnosis not present

## 2023-03-31 DIAGNOSIS — M5459 Other low back pain: Secondary | ICD-10-CM | POA: Diagnosis not present

## 2023-03-31 DIAGNOSIS — R41841 Cognitive communication deficit: Secondary | ICD-10-CM | POA: Diagnosis not present

## 2023-03-31 DIAGNOSIS — Z8673 Personal history of transient ischemic attack (TIA), and cerebral infarction without residual deficits: Secondary | ICD-10-CM | POA: Diagnosis not present

## 2023-04-03 DIAGNOSIS — Z8673 Personal history of transient ischemic attack (TIA), and cerebral infarction without residual deficits: Secondary | ICD-10-CM | POA: Diagnosis not present

## 2023-04-03 DIAGNOSIS — R41841 Cognitive communication deficit: Secondary | ICD-10-CM | POA: Diagnosis not present

## 2023-04-03 DIAGNOSIS — M5459 Other low back pain: Secondary | ICD-10-CM | POA: Diagnosis not present

## 2023-04-03 DIAGNOSIS — R278 Other lack of coordination: Secondary | ICD-10-CM | POA: Diagnosis not present

## 2023-04-04 DIAGNOSIS — M5459 Other low back pain: Secondary | ICD-10-CM | POA: Diagnosis not present

## 2023-04-04 DIAGNOSIS — R278 Other lack of coordination: Secondary | ICD-10-CM | POA: Diagnosis not present

## 2023-04-04 DIAGNOSIS — R41841 Cognitive communication deficit: Secondary | ICD-10-CM | POA: Diagnosis not present

## 2023-04-04 DIAGNOSIS — Z8673 Personal history of transient ischemic attack (TIA), and cerebral infarction without residual deficits: Secondary | ICD-10-CM | POA: Diagnosis not present

## 2023-04-05 DIAGNOSIS — M5459 Other low back pain: Secondary | ICD-10-CM | POA: Diagnosis not present

## 2023-04-05 DIAGNOSIS — R41841 Cognitive communication deficit: Secondary | ICD-10-CM | POA: Diagnosis not present

## 2023-04-05 DIAGNOSIS — R278 Other lack of coordination: Secondary | ICD-10-CM | POA: Diagnosis not present

## 2023-04-05 DIAGNOSIS — Z8673 Personal history of transient ischemic attack (TIA), and cerebral infarction without residual deficits: Secondary | ICD-10-CM | POA: Diagnosis not present

## 2023-04-06 DIAGNOSIS — Z8673 Personal history of transient ischemic attack (TIA), and cerebral infarction without residual deficits: Secondary | ICD-10-CM | POA: Diagnosis not present

## 2023-04-06 DIAGNOSIS — M5459 Other low back pain: Secondary | ICD-10-CM | POA: Diagnosis not present

## 2023-04-06 DIAGNOSIS — R41841 Cognitive communication deficit: Secondary | ICD-10-CM | POA: Diagnosis not present

## 2023-04-06 DIAGNOSIS — R278 Other lack of coordination: Secondary | ICD-10-CM | POA: Diagnosis not present

## 2023-04-07 DIAGNOSIS — R278 Other lack of coordination: Secondary | ICD-10-CM | POA: Diagnosis not present

## 2023-04-07 DIAGNOSIS — R41841 Cognitive communication deficit: Secondary | ICD-10-CM | POA: Diagnosis not present

## 2023-04-07 DIAGNOSIS — Z8673 Personal history of transient ischemic attack (TIA), and cerebral infarction without residual deficits: Secondary | ICD-10-CM | POA: Diagnosis not present

## 2023-04-07 DIAGNOSIS — M5459 Other low back pain: Secondary | ICD-10-CM | POA: Diagnosis not present

## 2023-04-10 DIAGNOSIS — H16223 Keratoconjunctivitis sicca, not specified as Sjogren's, bilateral: Secondary | ICD-10-CM | POA: Diagnosis not present

## 2023-04-10 DIAGNOSIS — M5459 Other low back pain: Secondary | ICD-10-CM | POA: Diagnosis not present

## 2023-04-10 DIAGNOSIS — H53002 Unspecified amblyopia, left eye: Secondary | ICD-10-CM | POA: Diagnosis not present

## 2023-04-10 DIAGNOSIS — Z8673 Personal history of transient ischemic attack (TIA), and cerebral infarction without residual deficits: Secondary | ICD-10-CM | POA: Diagnosis not present

## 2023-04-10 DIAGNOSIS — H353131 Nonexudative age-related macular degeneration, bilateral, early dry stage: Secondary | ICD-10-CM | POA: Diagnosis not present

## 2023-04-10 DIAGNOSIS — H5213 Myopia, bilateral: Secondary | ICD-10-CM | POA: Diagnosis not present

## 2023-04-10 DIAGNOSIS — R278 Other lack of coordination: Secondary | ICD-10-CM | POA: Diagnosis not present

## 2023-04-10 DIAGNOSIS — R41841 Cognitive communication deficit: Secondary | ICD-10-CM | POA: Diagnosis not present

## 2023-04-11 DIAGNOSIS — M5459 Other low back pain: Secondary | ICD-10-CM | POA: Diagnosis not present

## 2023-04-11 DIAGNOSIS — Z8673 Personal history of transient ischemic attack (TIA), and cerebral infarction without residual deficits: Secondary | ICD-10-CM | POA: Diagnosis not present

## 2023-04-11 DIAGNOSIS — R41841 Cognitive communication deficit: Secondary | ICD-10-CM | POA: Diagnosis not present

## 2023-04-11 DIAGNOSIS — R278 Other lack of coordination: Secondary | ICD-10-CM | POA: Diagnosis not present

## 2023-04-12 DIAGNOSIS — L814 Other melanin hyperpigmentation: Secondary | ICD-10-CM | POA: Diagnosis not present

## 2023-04-12 DIAGNOSIS — L905 Scar conditions and fibrosis of skin: Secondary | ICD-10-CM | POA: Diagnosis not present

## 2023-04-12 DIAGNOSIS — R41841 Cognitive communication deficit: Secondary | ICD-10-CM | POA: Diagnosis not present

## 2023-04-12 DIAGNOSIS — L821 Other seborrheic keratosis: Secondary | ICD-10-CM | POA: Diagnosis not present

## 2023-04-12 DIAGNOSIS — L853 Xerosis cutis: Secondary | ICD-10-CM | POA: Diagnosis not present

## 2023-04-12 DIAGNOSIS — R278 Other lack of coordination: Secondary | ICD-10-CM | POA: Diagnosis not present

## 2023-04-12 DIAGNOSIS — Z8673 Personal history of transient ischemic attack (TIA), and cerebral infarction without residual deficits: Secondary | ICD-10-CM | POA: Diagnosis not present

## 2023-04-12 DIAGNOSIS — D1801 Hemangioma of skin and subcutaneous tissue: Secondary | ICD-10-CM | POA: Diagnosis not present

## 2023-04-12 DIAGNOSIS — M5459 Other low back pain: Secondary | ICD-10-CM | POA: Diagnosis not present

## 2023-04-13 DIAGNOSIS — R41841 Cognitive communication deficit: Secondary | ICD-10-CM | POA: Diagnosis not present

## 2023-04-13 DIAGNOSIS — Z8673 Personal history of transient ischemic attack (TIA), and cerebral infarction without residual deficits: Secondary | ICD-10-CM | POA: Diagnosis not present

## 2023-04-14 DIAGNOSIS — R41841 Cognitive communication deficit: Secondary | ICD-10-CM | POA: Diagnosis not present

## 2023-04-14 DIAGNOSIS — R278 Other lack of coordination: Secondary | ICD-10-CM | POA: Diagnosis not present

## 2023-04-14 DIAGNOSIS — M5459 Other low back pain: Secondary | ICD-10-CM | POA: Diagnosis not present

## 2023-04-14 DIAGNOSIS — Z8673 Personal history of transient ischemic attack (TIA), and cerebral infarction without residual deficits: Secondary | ICD-10-CM | POA: Diagnosis not present

## 2023-04-15 DIAGNOSIS — M5459 Other low back pain: Secondary | ICD-10-CM | POA: Diagnosis not present

## 2023-04-15 DIAGNOSIS — R278 Other lack of coordination: Secondary | ICD-10-CM | POA: Diagnosis not present

## 2023-04-17 DIAGNOSIS — M5459 Other low back pain: Secondary | ICD-10-CM | POA: Diagnosis not present

## 2023-04-17 DIAGNOSIS — R41841 Cognitive communication deficit: Secondary | ICD-10-CM | POA: Diagnosis not present

## 2023-04-17 DIAGNOSIS — Z8673 Personal history of transient ischemic attack (TIA), and cerebral infarction without residual deficits: Secondary | ICD-10-CM | POA: Diagnosis not present

## 2023-04-17 DIAGNOSIS — R278 Other lack of coordination: Secondary | ICD-10-CM | POA: Diagnosis not present

## 2023-04-18 DIAGNOSIS — Z8673 Personal history of transient ischemic attack (TIA), and cerebral infarction without residual deficits: Secondary | ICD-10-CM | POA: Diagnosis not present

## 2023-04-18 DIAGNOSIS — R278 Other lack of coordination: Secondary | ICD-10-CM | POA: Diagnosis not present

## 2023-04-18 DIAGNOSIS — R41841 Cognitive communication deficit: Secondary | ICD-10-CM | POA: Diagnosis not present

## 2023-04-18 DIAGNOSIS — M5459 Other low back pain: Secondary | ICD-10-CM | POA: Diagnosis not present

## 2023-04-19 DIAGNOSIS — Z8673 Personal history of transient ischemic attack (TIA), and cerebral infarction without residual deficits: Secondary | ICD-10-CM | POA: Diagnosis not present

## 2023-04-19 DIAGNOSIS — R278 Other lack of coordination: Secondary | ICD-10-CM | POA: Diagnosis not present

## 2023-04-19 DIAGNOSIS — R41841 Cognitive communication deficit: Secondary | ICD-10-CM | POA: Diagnosis not present

## 2023-04-19 DIAGNOSIS — M5459 Other low back pain: Secondary | ICD-10-CM | POA: Diagnosis not present

## 2023-04-20 DIAGNOSIS — R278 Other lack of coordination: Secondary | ICD-10-CM | POA: Diagnosis not present

## 2023-04-20 DIAGNOSIS — Z8673 Personal history of transient ischemic attack (TIA), and cerebral infarction without residual deficits: Secondary | ICD-10-CM | POA: Diagnosis not present

## 2023-04-20 DIAGNOSIS — M5459 Other low back pain: Secondary | ICD-10-CM | POA: Diagnosis not present

## 2023-04-20 DIAGNOSIS — R41841 Cognitive communication deficit: Secondary | ICD-10-CM | POA: Diagnosis not present

## 2023-04-21 DIAGNOSIS — M5459 Other low back pain: Secondary | ICD-10-CM | POA: Diagnosis not present

## 2023-04-21 DIAGNOSIS — R278 Other lack of coordination: Secondary | ICD-10-CM | POA: Diagnosis not present

## 2023-04-21 DIAGNOSIS — Z8673 Personal history of transient ischemic attack (TIA), and cerebral infarction without residual deficits: Secondary | ICD-10-CM | POA: Diagnosis not present

## 2023-04-21 DIAGNOSIS — R41841 Cognitive communication deficit: Secondary | ICD-10-CM | POA: Diagnosis not present

## 2023-04-24 DIAGNOSIS — R41841 Cognitive communication deficit: Secondary | ICD-10-CM | POA: Diagnosis not present

## 2023-04-24 DIAGNOSIS — M5459 Other low back pain: Secondary | ICD-10-CM | POA: Diagnosis not present

## 2023-04-24 DIAGNOSIS — R278 Other lack of coordination: Secondary | ICD-10-CM | POA: Diagnosis not present

## 2023-04-24 DIAGNOSIS — Z8673 Personal history of transient ischemic attack (TIA), and cerebral infarction without residual deficits: Secondary | ICD-10-CM | POA: Diagnosis not present

## 2023-04-25 ENCOUNTER — Non-Acute Institutional Stay (SKILLED_NURSING_FACILITY): Payer: Medicare PPO | Admitting: Orthopedic Surgery

## 2023-04-25 ENCOUNTER — Encounter: Payer: Self-pay | Admitting: Orthopedic Surgery

## 2023-04-25 DIAGNOSIS — Z8673 Personal history of transient ischemic attack (TIA), and cerebral infarction without residual deficits: Secondary | ICD-10-CM | POA: Diagnosis not present

## 2023-04-25 DIAGNOSIS — R634 Abnormal weight loss: Secondary | ICD-10-CM

## 2023-04-25 DIAGNOSIS — I10 Essential (primary) hypertension: Secondary | ICD-10-CM | POA: Diagnosis not present

## 2023-04-25 DIAGNOSIS — R278 Other lack of coordination: Secondary | ICD-10-CM | POA: Diagnosis not present

## 2023-04-25 DIAGNOSIS — I480 Paroxysmal atrial fibrillation: Secondary | ICD-10-CM

## 2023-04-25 DIAGNOSIS — R4189 Other symptoms and signs involving cognitive functions and awareness: Secondary | ICD-10-CM

## 2023-04-25 DIAGNOSIS — E7800 Pure hypercholesterolemia, unspecified: Secondary | ICD-10-CM | POA: Diagnosis not present

## 2023-04-25 DIAGNOSIS — F329 Major depressive disorder, single episode, unspecified: Secondary | ICD-10-CM | POA: Diagnosis not present

## 2023-04-25 DIAGNOSIS — D329 Benign neoplasm of meninges, unspecified: Secondary | ICD-10-CM

## 2023-04-25 DIAGNOSIS — R41841 Cognitive communication deficit: Secondary | ICD-10-CM | POA: Diagnosis not present

## 2023-04-25 DIAGNOSIS — R531 Weakness: Secondary | ICD-10-CM | POA: Diagnosis not present

## 2023-04-25 DIAGNOSIS — M5459 Other low back pain: Secondary | ICD-10-CM | POA: Diagnosis not present

## 2023-04-25 NOTE — Progress Notes (Signed)
[facility] Room Number: 127-A Place of Service:  SNF [id number]) Provider:  Hazle Nordmann, NP  PCP: Mahlon Gammon, MD  Patient Care Team: Mahlon Gammon, MD as PCP - General (Internal Medicine) Croitoru, Rachelle Hora, MD as PCP - Cardiology (Cardiology) Nita Sells, MD as Consulting Physician (Dermatology) Nelson Chimes, MD as Consulting Physician (Ophthalmology) Drema Dallas, DO as Consulting Physician (Neurology) Axel Filler Larna Daughters, NP as Nurse Practitioner (Hematology and Oncology)  Extended Emergency Contact Information Primary Emergency Contact: Ray,Ruth  [place] of [place] Home Phone: [phone] Mobile Phone: [phone] Relation: Daughter Secondary Emergency Contact: Corliss [place] of [place] Home Phone: [phone] Relation: Son  Code Status:  DNR Goals of care: Advanced Directive information    04/25/2023   10:23 AM  Advanced Directives  Does Patient Have a Medical Advance Directive? Yes  Type of Estate agent of [place];Living will;Out of facility DNR (pink MOST or yellow form)  Does patient want to make changes to medical advance directive? No - Patient declined  Copy of Healthcare Power of Attorney in Chart? Yes - validated most recent copy scanned in chart (See row information)     Chief Complaint  Patient presents with   Medical Management of Chronic Issues    Routine visit.     HPI:  Pt is a 89 y.o. female seen today for medical management of chronic diseases.    She currently resides on the skilled nursing unit (admitted 02/23/2023) at [facility] due to weakness. PMH: PAF, HTN, HLD, left breast cancer, meningioma, osteopenia, and gait abnormality.   Generalized weakness- 01/2023 frequent falls, admitted to rehab unit after ED visit 8/29, ambulating with Rolator, followed by PT/OT Cognitive impairment- MMSE 19/30 02/27/2023, forgetful, no behavior, 08/29 CT head  noted periventricular and deep white matter hypodensity, dependent with some ADLs, not on medication Meningioma- 08/29 CT head noted 3.1 x 2.7 cm meningioma> increased in size over years, non surgical intervention per neurosurgery/neurology Reactive depression- Na+ 144 03/23/2023, remains on Leaxpro HTN- BUN/creat 18/0.8 03/23/2023, remains on metoprolol and  hydralazine for SBP> 175 HLD- total 152/ LDL 70 08/04/2022, remains on pravastatin PAF-  HR< 100 with metoprolol, remains on Eliquis for clot prevention  No recent falls or injuries.   Recent blood pressures:  10/20- 127/83  10/14- 141/90, 128/84  10/13- 123/89, 124/84    Past Medical History:  Diagnosis Date   Allergy    Ankle fracture, left    Atrial fibrillation (HCC)    Per [facility] New Patient Packet    Breast cancer (HCC) 1115/16   left    Bursitis of right shoulder    Cataract    Family history of cancer    Fracture of right wrist    GERD (gastroesophageal reflux disease)    Hyperlipidemia    Hypertension    Lumbar radiculitis    Per [facility] New Patient Packet    Meningioma [facility])    Per [facility] New Patient Packet    Skin cancer 2000   melanoma and basal cell   Stress fracture    Right Heel, Per [facility] New Patient Packet    TIA (transient ischemic attack) 2022   Vaso vagal episode    during preparation for colonoscopy   Past Surgical History:  Procedure Laterality Date   ABDOMINAL HYSTERECTOMY  1988   TAH/BSO--FIBROIDS   APPENDECTOMY     BREAST LUMPECTOMY     B/L--[facility]   BREAST LUMPECTOMY WITH RADIOACTIVE SEED  AND SENTINEL LYMPH NODE BIOPSY Left 06/11/2015   Procedure: LEFT BREAST LUMPECTOMY WITH RADIOACTIVE SEED AND LEFT SENTINEL LYMPH NODE MAPPING;  Surgeon: Harriette Bouillon, MD;  Location: [facility] OR;  Service: General;  Laterality: Left;   CARDIOVERSION N/A 01/11/2022   Procedure: CARDIOVERSION;  Surgeon: Jodelle Red, MD;  Location: [facility] ENDOSCOPY;  Service: Cardiovascular;  Laterality: N/A;   CATARACT EXTRACTION   2015   COLONOSCOPY  2010   DG  BONE DENSITY ([facility] HX)     EYE SURGERY Bilateral    cataracts   fiberadenoma Bilateral 1978, 1980   GANGLION CYST EXCISION     L  hand   Lipiflow procedure      Allergies  Allergen Reactions   Sulfa Antibiotics Other (See Comments)    As a child, became "rigid as a stick and not responsive"   Tape Other (See Comments)    Blisters, Please use "paper" tape only for short periods of time   Wound Dressing Adhesive Other (See Comments)    Blisters, Please use "paper" tape only for short periods of time, Blisters, Please use "paper" tape only for short periods of time   Glucosamine Forte [Nutritional Supplements] Rash   Latex Rash    Allergies as of 04/25/2023       Reactions   Sulfa Antibiotics Other (See Comments)   As a child, became "rigid as a stick and not responsive"   Tape Other (See Comments)   Blisters, Please use "paper" tape only for short periods of time   Wound Dressing Adhesive Other (See Comments)   Blisters, Please use "paper" tape only for short periods of time, Blisters, Please use "paper" tape only for short periods of time   Glucosamine Forte [nutritional Supplements] Rash   Latex Rash        Medication List        Accurate as of April 25, 2023 10:23 AM. If you have any questions, ask your nurse or doctor.          STOP taking these medications    Biotin 5 MG Caps Stopped by: Kelin Nixon E Sufyaan Palma   multivitamin with minerals tablet Stopped by: Zohar Laing E Jakson Delpilar       TAKE these medications    acetaminophen 500 MG tablet Commonly known as: TYLENOL Take 1,000 mg by mouth in the morning.   acetaminophen 500 MG tablet Commonly known as: TYLENOL Take 1,000 mg by mouth 2 (two) times daily as needed for moderate pain.   Calcium 600+D3 600-10 MG-MCG Tabs Generic drug: Calcium Carb-Cholecalciferol Take 1 tablet by mouth in the morning.   cyanocobalamin 1000 MCG tablet Commonly known as: VITAMIN B12 Take 1 tablet (1,000  mcg total) by mouth daily.   cycloSPORINE 0.05 % ophthalmic emulsion Commonly known as: RESTASIS Place 1 drop into both eyes 2 (two) times daily.  after applying a hot compress   Eliquis 5 MG Tabs tablet Generic drug: apixaban TAKE ONE TABLET BY MOUTH TWICE DAILY   escitalopram 5 MG tablet Commonly known as: LEXAPRO Take 20 mg by mouth in the morning.   FISH OIL PO Take 1 capsule by mouth in the morning. 350 mg-235 mg-90 mg-597 mg   hydrALAZINE 10 MG tablet Commonly known as: APRESOLINE Take 10 mg by mouth every 8 (eight) hours as needed.   lidocaine 4 % Place 1 patch onto the skin daily.   loperamide 2 MG tablet Commonly known as: IMODIUM A-D Take 2 mg by mouth 3 (three) times daily as  needed for diarrhea or loose stools.   metoprolol succinate 25 MG 24 hr tablet Commonly known as: Toprol XL Take 25 mg every morning   polyethylene glycol 17 g packet Commonly known as: MIRALAX / GLYCOLAX Take 17 g by mouth in the morning.   pravastatin 40 MG tablet Commonly known as: PRAVACHOL TAKE ONE TABLET BY MOUTH ONCE DAILY   senna 8.6 MG Tabs tablet Commonly known as: SENOKOT Take 2 tablets (17.2 mg total) by mouth at bedtime.   sodium chloride 5 % ophthalmic ointment Commonly known as: MURO 128 Place 1 Application into both eyes at bedtime.   Turmeric 500 MG Caps Take 500 mg by mouth in the morning.   Vitamin D3 25 MCG (1000 UT) Caps Take 1,000 Units by mouth in the morning.        Review of Systems  Constitutional:  Negative for activity change and appetite change.  HENT:  Negative for congestion, sore throat and trouble swallowing.   Respiratory:  Negative for cough, shortness of breath and wheezing.   Cardiovascular:  Negative for chest pain and leg swelling.  Gastrointestinal:  Negative for abdominal distention and abdominal pain.  Genitourinary:  Negative for dysuria and frequency.  Musculoskeletal:  Positive for arthralgias and gait problem.  Skin:   Negative for wound.  Neurological:  Positive for weakness. Negative for dizziness and headaches.  Psychiatric/Behavioral:  Positive for confusion and dysphoric mood. Negative for sleep disturbance. The patient is not nervous/anxious.     Immunization History  Administered Date(s) Administered   Fluad Quad(high Dose 65+) 02/27/2020, 03/29/2022   H1N1 07/01/2008   Influenza Whole 04/10/2007, 04/01/2009, 03/26/2010, 03/25/2012   Influenza, High Dose Seasonal PF 04/16/2014, 04/09/2015, 04/03/2017, 04/19/2018, 02/28/2019, 03/08/2021, 04/18/2023   Influenza-Unspecified 06/27/2016, 04/03/2017   Moderna Sars-Covid-2 Vaccination 04/27/2022   PFIZER Comirnaty(Gray Top)Covid-19 Tri-Sucrose Vaccine 01/05/2021   PFIZER(Purple Top)SARS-COV-2 Vaccination 07/18/2019, 08/08/2019, 03/29/2020   Pneumococcal Conjugate-13 05/13/2014   Pneumococcal Polysaccharide-23 08/29/2016   Td 11/05/2003   Tdap 06/28/2011, 09/19/2011, 10/23/2020   Tetanus 10/23/2020   Unspecified SARS-COV-2 Vaccination 04/18/2023   Zoster Recombinant(Shingrix) 10/08/2016, 01/03/2017   Zoster, Live 02/24/2010   Pertinent  Health Maintenance Due  Topic Date Due   MAMMOGRAM  07/12/2023   INFLUENZA VACCINE  Completed   DEXA SCAN  Completed      12/20/2022   10:44 AM 01/03/2023    8:20 AM 01/24/2023   12:43 PM 02/20/2023   12:50 PM 03/07/2023    2:38 PM  Fall Risk  Falls in the past year? 1 1 1 1 1   Was there an injury with Fall? 1 1 0 0 1  Fall Risk Category Calculator 2 2 1 1 3   Patient at Risk for Falls Due to Impaired balance/gait;Impaired mobility;History of fall(s) History of fall(s);Impaired balance/gait;Impaired mobility History of fall(s) History of fall(s);Impaired mobility History of fall(s);Impaired balance/gait;Impaired mobility  Fall risk Follow up Falls evaluation completed Falls evaluation completed Falls evaluation completed;Education provided;Falls prevention discussed Falls evaluation completed Falls evaluation  completed;Education provided;Falls prevention discussed   Functional Status Survey:    Vitals:   04/25/23 1017  BP: 127/83  Pulse: 71  Resp: 18  Temp: (!) 96.5 F (35.8 C)  SpO2: 98%  Weight: 130 lb 6.4 oz (59.1 kg)  Height: 4\' 8"  (1.422 m)   Body mass index is 29.24 kg/m. Physical Exam Vitals reviewed.  Constitutional:      General: She is not in acute distress. HENT:     Head: Normocephalic.  Right Ear: Tympanic membrane normal. There is no impacted cerumen.     Left Ear: Tympanic membrane normal. There is no impacted cerumen.     Nose: Nose normal.     Mouth/Throat:     Mouth: Mucous membranes are moist.  Eyes:     General:        Right eye: No discharge.        Left eye: No discharge.  Cardiovascular:     Rate and Rhythm: Normal rate. Rhythm irregular.     Pulses: Normal pulses.     Heart sounds: Normal heart sounds.  Pulmonary:     Effort: Pulmonary effort is normal. No respiratory distress.     Breath sounds: Normal breath sounds. No wheezing.  Abdominal:     General: Bowel sounds are normal.     Palpations: Abdomen is soft.  Musculoskeletal:     Cervical back: Neck supple.     Right lower leg: No edema.     Left lower leg: No edema.  Skin:    General: Skin is warm.     Capillary Refill: Capillary refill takes less than 2 seconds.  Neurological:     General: No focal deficit present.     Mental Status: She is alert. Mental status is at baseline.     Motor: Weakness present.     Gait: Gait abnormal.     Comments: rolator  Psychiatric:        Mood and Affect: Mood normal.     Labs reviewed: Recent Labs    12/04/22 1906 12/11/22 1937 01/30/23 2123 01/30/23 2133 02/23/23 0959 03/23/23 0000  NA 141   < > 137 138 140 144  K 4.1   < > 4.0 3.8 3.7 4.7  CL 106   < > 102 103 101 106  CO2 26   < > 26  --  31 26*  GLUCOSE 96   < > 125* 123* 76  --   BUN 28*   < > 31* 32* 15 18  CREATININE 1.06*   < > 1.03* 1.00 0.94 0.8  CALCIUM 9.6   < > 10.0   --  9.8 9.8  MG 2.5*  --   --   --   --   --    < > = values in this interval not displayed.   Recent Labs    12/11/22 1937 01/30/23 2123 02/23/23 0959  AST 22 28 24   ALT 18 18 15   ALKPHOS 64 55 50  BILITOT 0.9 1.2 1.3*  PROT 7.1 5.7* 5.8*  ALBUMIN 4.2 3.5 3.5   Recent Labs    12/04/22 1906 12/11/22 1937 12/12/22 0859 12/13/22 0411 01/30/23 2123 01/30/23 2133 02/23/23 0959  WBC 8.5 9.1   < > 8.4 8.8  --  8.6  NEUTROABS 5.8 5.9  --   --   --   --  6.5  HGB 15.3* 17.6*   < > 16.5* 14.9 15.3* 15.4*  HCT 45.3 51.4*   < > 49.0* 43.8 45.0 45.7  MCV 92.1 91.8   < > 90.1 90.7  --  90.9  PLT 191 217   < > 208 208  --  208   < > = values in this interval not displayed.   Lab Results  Component Value Date   TSH 3.099 12/12/2022   Lab Results  Component Value Date   HGBA1C 4.8 05/11/2021   Lab Results  Component Value Date   CHOL 152 08/04/2022  HDL 63 08/04/2022   LDLCALC 70 08/04/2022   LDLDIRECT 153.4 11/16/2009   TRIG 91 08/04/2022   CHOLHDL 2.5 05/11/2021    Significant Diagnostic Results in last 30 days:  No results found.  Assessment/Plan 1. Weakness generalized - improved - now lives in SNF since 08/29 - no recent falls - ambulates with rolator - cont PT/OT  2. Cognitive impairment - recent MMSE 19/30 - no behaviors - dependent with some ADLs - not on medication - cont skilled nursing  3. Meningioma (HCC) - increased in size over years> now 3.1 x 2.7 cm - no further workup at this time per neurosurgery/ neurology  4. Reactive depression - Na+ stable - cont Lexapro  5. Essential hypertension - controlled  - cont metoprolol and hydralazine prn  6. Pure hypercholesterolemia - total and LDL stable - cont pravastatin  7. Paroxysmal atrial fibrillation (HCC) - HR< 100 with metoprolol - cont Eliquis for clot prevention  8. Weight loss - lost about 10 lbs in 2 months - BMI 29.24 - cont monthly weights    Family/ staff  Communication: plan discussed with patient and nurse  Labs/tests ordered: none

## 2023-04-26 DIAGNOSIS — Z8673 Personal history of transient ischemic attack (TIA), and cerebral infarction without residual deficits: Secondary | ICD-10-CM | POA: Diagnosis not present

## 2023-04-26 DIAGNOSIS — R278 Other lack of coordination: Secondary | ICD-10-CM | POA: Diagnosis not present

## 2023-04-26 DIAGNOSIS — M5459 Other low back pain: Secondary | ICD-10-CM | POA: Diagnosis not present

## 2023-04-26 DIAGNOSIS — R41841 Cognitive communication deficit: Secondary | ICD-10-CM | POA: Diagnosis not present

## 2023-04-27 DIAGNOSIS — R278 Other lack of coordination: Secondary | ICD-10-CM | POA: Diagnosis not present

## 2023-04-27 DIAGNOSIS — M5459 Other low back pain: Secondary | ICD-10-CM | POA: Diagnosis not present

## 2023-04-28 DIAGNOSIS — R41841 Cognitive communication deficit: Secondary | ICD-10-CM | POA: Diagnosis not present

## 2023-04-28 DIAGNOSIS — M5459 Other low back pain: Secondary | ICD-10-CM | POA: Diagnosis not present

## 2023-04-28 DIAGNOSIS — Z8673 Personal history of transient ischemic attack (TIA), and cerebral infarction without residual deficits: Secondary | ICD-10-CM | POA: Diagnosis not present

## 2023-04-28 DIAGNOSIS — R278 Other lack of coordination: Secondary | ICD-10-CM | POA: Diagnosis not present

## 2023-05-01 DIAGNOSIS — M5459 Other low back pain: Secondary | ICD-10-CM | POA: Diagnosis not present

## 2023-05-01 DIAGNOSIS — R41841 Cognitive communication deficit: Secondary | ICD-10-CM | POA: Diagnosis not present

## 2023-05-01 DIAGNOSIS — R278 Other lack of coordination: Secondary | ICD-10-CM | POA: Diagnosis not present

## 2023-05-01 DIAGNOSIS — Z8673 Personal history of transient ischemic attack (TIA), and cerebral infarction without residual deficits: Secondary | ICD-10-CM | POA: Diagnosis not present

## 2023-05-02 DIAGNOSIS — R278 Other lack of coordination: Secondary | ICD-10-CM | POA: Diagnosis not present

## 2023-05-02 DIAGNOSIS — R41841 Cognitive communication deficit: Secondary | ICD-10-CM | POA: Diagnosis not present

## 2023-05-02 DIAGNOSIS — M5459 Other low back pain: Secondary | ICD-10-CM | POA: Diagnosis not present

## 2023-05-02 DIAGNOSIS — Z8673 Personal history of transient ischemic attack (TIA), and cerebral infarction without residual deficits: Secondary | ICD-10-CM | POA: Diagnosis not present

## 2023-05-03 DIAGNOSIS — R278 Other lack of coordination: Secondary | ICD-10-CM | POA: Diagnosis not present

## 2023-05-03 DIAGNOSIS — M5459 Other low back pain: Secondary | ICD-10-CM | POA: Diagnosis not present

## 2023-05-03 DIAGNOSIS — Z8673 Personal history of transient ischemic attack (TIA), and cerebral infarction without residual deficits: Secondary | ICD-10-CM | POA: Diagnosis not present

## 2023-05-03 DIAGNOSIS — R41841 Cognitive communication deficit: Secondary | ICD-10-CM | POA: Diagnosis not present

## 2023-05-04 DIAGNOSIS — M5459 Other low back pain: Secondary | ICD-10-CM | POA: Diagnosis not present

## 2023-05-04 DIAGNOSIS — Z8673 Personal history of transient ischemic attack (TIA), and cerebral infarction without residual deficits: Secondary | ICD-10-CM | POA: Diagnosis not present

## 2023-05-04 DIAGNOSIS — R278 Other lack of coordination: Secondary | ICD-10-CM | POA: Diagnosis not present

## 2023-05-04 DIAGNOSIS — R41841 Cognitive communication deficit: Secondary | ICD-10-CM | POA: Diagnosis not present

## 2023-05-05 ENCOUNTER — Telehealth: Payer: Medicare PPO | Admitting: *Deleted

## 2023-05-05 DIAGNOSIS — R278 Other lack of coordination: Secondary | ICD-10-CM | POA: Diagnosis not present

## 2023-05-05 DIAGNOSIS — M5459 Other low back pain: Secondary | ICD-10-CM | POA: Diagnosis not present

## 2023-05-05 DIAGNOSIS — R41841 Cognitive communication deficit: Secondary | ICD-10-CM | POA: Diagnosis not present

## 2023-05-05 DIAGNOSIS — Z8673 Personal history of transient ischemic attack (TIA), and cerebral infarction without residual deficits: Secondary | ICD-10-CM | POA: Diagnosis not present

## 2023-05-05 NOTE — Telephone Encounter (Signed)
Received fax from [facility] #[phone] stating a Previous request for Prolia was submitted and has been APPROVED. The authorization [id number] is good from 06/28/2023-06/26/2024  Request has been APPROVED and extended for the next Calendar year  Authorization Fax Sent to [employer].

## 2023-05-08 DIAGNOSIS — Z8673 Personal history of transient ischemic attack (TIA), and cerebral infarction without residual deficits: Secondary | ICD-10-CM | POA: Diagnosis not present

## 2023-05-08 DIAGNOSIS — R41841 Cognitive communication deficit: Secondary | ICD-10-CM | POA: Diagnosis not present

## 2023-05-09 DIAGNOSIS — M5459 Other low back pain: Secondary | ICD-10-CM | POA: Diagnosis not present

## 2023-05-09 DIAGNOSIS — R278 Other lack of coordination: Secondary | ICD-10-CM | POA: Diagnosis not present

## 2023-05-10 DIAGNOSIS — R41841 Cognitive communication deficit: Secondary | ICD-10-CM | POA: Diagnosis not present

## 2023-05-10 DIAGNOSIS — Z8673 Personal history of transient ischemic attack (TIA), and cerebral infarction without residual deficits: Secondary | ICD-10-CM | POA: Diagnosis not present

## 2023-05-12 DIAGNOSIS — R278 Other lack of coordination: Secondary | ICD-10-CM | POA: Diagnosis not present

## 2023-05-12 DIAGNOSIS — M5459 Other low back pain: Secondary | ICD-10-CM | POA: Diagnosis not present

## 2023-05-15 DIAGNOSIS — R41841 Cognitive communication deficit: Secondary | ICD-10-CM | POA: Diagnosis not present

## 2023-05-15 DIAGNOSIS — Z8673 Personal history of transient ischemic attack (TIA), and cerebral infarction without residual deficits: Secondary | ICD-10-CM | POA: Diagnosis not present

## 2023-05-16 DIAGNOSIS — M5459 Other low back pain: Secondary | ICD-10-CM | POA: Diagnosis not present

## 2023-05-16 DIAGNOSIS — R278 Other lack of coordination: Secondary | ICD-10-CM | POA: Diagnosis not present

## 2023-05-17 DIAGNOSIS — Z8673 Personal history of transient ischemic attack (TIA), and cerebral infarction without residual deficits: Secondary | ICD-10-CM | POA: Diagnosis not present

## 2023-05-17 DIAGNOSIS — R41841 Cognitive communication deficit: Secondary | ICD-10-CM | POA: Diagnosis not present

## 2023-05-18 DIAGNOSIS — R278 Other lack of coordination: Secondary | ICD-10-CM | POA: Diagnosis not present

## 2023-05-18 DIAGNOSIS — M5459 Other low back pain: Secondary | ICD-10-CM | POA: Diagnosis not present

## 2023-05-19 DIAGNOSIS — Z8673 Personal history of transient ischemic attack (TIA), and cerebral infarction without residual deficits: Secondary | ICD-10-CM | POA: Diagnosis not present

## 2023-05-19 DIAGNOSIS — R41841 Cognitive communication deficit: Secondary | ICD-10-CM | POA: Diagnosis not present

## 2023-05-22 DIAGNOSIS — Z8673 Personal history of transient ischemic attack (TIA), and cerebral infarction without residual deficits: Secondary | ICD-10-CM | POA: Diagnosis not present

## 2023-05-22 DIAGNOSIS — R41841 Cognitive communication deficit: Secondary | ICD-10-CM | POA: Diagnosis not present

## 2023-05-23 ENCOUNTER — Encounter: Payer: Self-pay | Admitting: Orthopedic Surgery

## 2023-05-23 ENCOUNTER — Non-Acute Institutional Stay (SKILLED_NURSING_FACILITY): Payer: Self-pay | Admitting: Orthopedic Surgery

## 2023-05-23 DIAGNOSIS — M8580 Other specified disorders of bone density and structure, unspecified site: Secondary | ICD-10-CM | POA: Diagnosis not present

## 2023-05-23 DIAGNOSIS — D329 Benign neoplasm of meninges, unspecified: Secondary | ICD-10-CM | POA: Diagnosis not present

## 2023-05-23 DIAGNOSIS — E7800 Pure hypercholesterolemia, unspecified: Secondary | ICD-10-CM | POA: Diagnosis not present

## 2023-05-23 DIAGNOSIS — R531 Weakness: Secondary | ICD-10-CM

## 2023-05-23 DIAGNOSIS — I480 Paroxysmal atrial fibrillation: Secondary | ICD-10-CM | POA: Diagnosis not present

## 2023-05-23 DIAGNOSIS — I10 Essential (primary) hypertension: Secondary | ICD-10-CM | POA: Diagnosis not present

## 2023-05-23 DIAGNOSIS — M5459 Other low back pain: Secondary | ICD-10-CM | POA: Diagnosis not present

## 2023-05-23 DIAGNOSIS — R278 Other lack of coordination: Secondary | ICD-10-CM | POA: Diagnosis not present

## 2023-05-23 DIAGNOSIS — F329 Major depressive disorder, single episode, unspecified: Secondary | ICD-10-CM

## 2023-05-23 DIAGNOSIS — R4189 Other symptoms and signs involving cognitive functions and awareness: Secondary | ICD-10-CM | POA: Diagnosis not present

## 2023-05-23 NOTE — Progress Notes (Signed)
Location:  [facility] Room Number: 127/A Place of Service:  SNF [phone]) Provider:  Octavia Heir, NP   Mahlon Gammon, MD  Patient Care Team: Mahlon Gammon, MD as PCP - General (Internal Medicine) Thurmon Fair, MD as PCP - Cardiology (Cardiology) Nita Sells, MD as Consulting Physician (Dermatology) Nelson Chimes, MD as Consulting Physician (Ophthalmology) Drema Dallas, DO as Consulting Physician (Neurology) Axel Filler Larna Daughters, NP as Nurse Practitioner (Hematology and Oncology)  Extended Emergency Contact Information Primary Emergency Contact: Ray,Ruth  [place] of [place] Home Phone: [phone] Mobile Phone: [phone] Relation: Daughter Secondary Emergency Contact: Corliss Marcus [place] of [place] Home Phone: [phone] Relation: Son  Code Status:  DNR Goals of care: Advanced Directive information    04/25/2023   10:23 AM  Advanced Directives  Does Patient Have a Medical Advance Directive? Yes  Type of Estate agent of [place];Living will;Out of facility DNR (pink MOST or yellow form)  Does patient want to make changes to medical advance directive? No - Patient declined  Copy of Healthcare Power of Attorney in Chart? Yes - validated most recent copy scanned in chart (See row information)     Chief Complaint  Patient presents with   Medical Management of Chronic Issues    HPI:  Pt is a 89 y.o. female seen today for medical management of chronic diseases.    She currently resides on the skilled nursing unit (admitted 02/23/2023) at [facility] due to weakness. PMH: PAF, HTN, HLD, left breast cancer, meningioma, osteopenia, and gait abnormality.   HTN- BUN/creat 18/0.8 03/23/2023, remains on metoprolol and  hydralazine for SBP> 175 HLD- total 152/ LDL 70 08/04/2022, remains on pravastatin PAF-  HR< 100 with metoprolol, remains on Eliquis for clot prevention  Generalized weakness- h/o  frequent falls> 11/15 found on floor by nursing> no apparent injury, ambulates with Rolator Cognitive impairment- 08/29 CT head noted periventricular and deep white matter hypodensity, MMSE 19/30 02/27/2023, no behaviors, aphasia worse per nursing, not on medication Meningioma- 08/29 CT head noted 3.1 x 2.7 cm meningioma> increased in size over years, no surgical intervention per neurosurgery/neurology Reactive depression- Na+ 144 03/23/2023, remains on Leaxpro Senile osteopenia- remains on Prolia injections> scheduled 09/14/2023  Recent blood pressures:  11/24- 129/84  11/18- 132/77  11/17- 105/69  Recent weights:  11/02- 132.1 lbs  10/01- 133.2 lbs  09/03- 134.6 lbs   Past Medical History:  Diagnosis Date   Allergy    Ankle fracture, left    Atrial fibrillation (HCC)    Per [facility] New Patient Packet    Breast cancer (HCC) 1115/16   left    Bursitis of right shoulder    Cataract    Family history of cancer    Fracture of right wrist    GERD (gastroesophageal reflux disease)    Hyperlipidemia    Hypertension    Lumbar radiculitis    Per [facility] New Patient Packet    Meningioma [facility])    Per [facility] New Patient Packet    Skin cancer 2000   melanoma and basal cell   Stress fracture    Right Heel, Per [facility] New Patient Packet    TIA (transient ischemic attack) 2022   Vaso vagal episode    during preparation for colonoscopy   Past Surgical History:  Procedure Laterality Date   ABDOMINAL HYSTERECTOMY  1988   TAH/BSO--FIBROIDS   APPENDECTOMY     BREAST LUMPECTOMY     B/L--[facility]   BREAST  LUMPECTOMY WITH RADIOACTIVE SEED AND SENTINEL LYMPH NODE BIOPSY Left 06/11/2015   Procedure: LEFT BREAST LUMPECTOMY WITH RADIOACTIVE SEED AND LEFT SENTINEL LYMPH NODE MAPPING;  Surgeon: Harriette Bouillon, MD;  Location: [facility] OR;  Service: General;  Laterality: Left;   CARDIOVERSION N/A 01/11/2022   Procedure: CARDIOVERSION;  Surgeon: Jodelle Red, MD;  Location: [facility] ENDOSCOPY;  Service:  Cardiovascular;  Laterality: N/A;   CATARACT EXTRACTION  2015   COLONOSCOPY  2010   DG  BONE DENSITY ([facility] HX)     EYE SURGERY Bilateral    cataracts   fiberadenoma Bilateral 1978, 1980   GANGLION CYST EXCISION     L  hand   Lipiflow procedure      Allergies  Allergen Reactions   Sulfa Antibiotics Other (See Comments)    As a child, became "rigid as a stick and not responsive"   Tape Other (See Comments)    Blisters, Please use "paper" tape only for short periods of time   Wound Dressing Adhesive Other (See Comments)    Blisters, Please use "paper" tape only for short periods of time, Blisters, Please use "paper" tape only for short periods of time   Glucosamine Forte [Nutritional Supplements] Rash   Latex Rash    Outpatient Encounter Medications as of 05/23/2023  Medication Sig   acetaminophen (TYLENOL) 500 MG tablet Take 1,000 mg by mouth in the morning.   acetaminophen (TYLENOL) 500 MG tablet Take 1,000 mg by mouth 2 (two) times daily as needed for moderate pain.   Calcium Carb-Cholecalciferol (CALCIUM 600+D3) 600-10 MG-MCG TABS Take 1 tablet by mouth in the morning.   Cholecalciferol (VITAMIN D3) 1000 UNITS CAPS Take 1,000 Units by mouth in the morning.   cycloSPORINE (RESTASIS) 0.05 % ophthalmic emulsion Place 1 drop into both eyes 2 (two) times daily.  after applying a hot compress   ELIQUIS 5 MG TABS tablet TAKE ONE TABLET BY MOUTH TWICE DAILY   escitalopram (LEXAPRO) 5 MG tablet Take 20 mg by mouth in the morning.   hydrALAZINE (APRESOLINE) 10 MG tablet Take 10 mg by mouth every 8 (eight) hours as needed.   lidocaine 4 % Place 1 patch onto the skin daily.   loperamide (IMODIUM A-D) 2 MG tablet Take 2 mg by mouth 3 (three) times daily as needed for diarrhea or loose stools.   metoprolol succinate (TOPROL XL) 25 MG 24 hr tablet Take 25 mg every morning   Omega-3 Fatty Acids (FISH OIL PO) Take 1 capsule by mouth in the morning. 350 mg-235 mg-90 mg-597 mg   polyethylene  glycol (MIRALAX / GLYCOLAX) 17 g packet Take 17 g by mouth in the morning.   pravastatin (PRAVACHOL) 40 MG tablet TAKE ONE TABLET BY MOUTH ONCE DAILY   senna (SENOKOT) 8.6 MG TABS tablet Take 2 tablets (17.2 mg total) by mouth at bedtime.   sodium chloride (MURO 128) 5 % ophthalmic ointment Place 1 Application into both eyes at bedtime.   Turmeric 500 MG CAPS Take 500 mg by mouth in the morning.   vitamin B-12 (CYANOCOBALAMIN) 1000 MCG tablet Take 1 tablet (1,000 mcg total) by mouth daily.   No facility-administered encounter medications on file as of 05/23/2023.    Review of Systems  Unable to perform ROS: Dementia    Immunization History  Administered Date(s) Administered   Fluad Quad(high Dose 65+) 02/27/2020, 03/29/2022   H1N1 07/01/2008   Influenza Whole 04/10/2007, 04/01/2009, 03/26/2010, 03/25/2012   Influenza, High Dose Seasonal PF 04/16/2014, 04/09/2015, 04/03/2017, 04/19/2018,  02/28/2019, 03/08/2021, 04/18/2023   Influenza-Unspecified 06/27/2016, 04/03/2017   Moderna Sars-Covid-2 Vaccination 04/27/2022   PFIZER Comirnaty(Gray Top)Covid-19 Tri-Sucrose Vaccine 01/05/2021   PFIZER(Purple Top)SARS-COV-2 Vaccination 07/18/2019, 08/08/2019, 03/29/2020   Pneumococcal Conjugate-13 05/13/2014   Pneumococcal Polysaccharide-23 08/29/2016   Td 11/05/2003   Tdap 06/28/2011, 09/19/2011, 10/23/2020   Tetanus 10/23/2020   Unspecified SARS-COV-2 Vaccination 04/18/2023   Zoster Recombinant(Shingrix) 10/08/2016, 01/03/2017   Zoster, Live 02/24/2010   Pertinent  Health Maintenance Due  Topic Date Due   MAMMOGRAM  07/12/2023   INFLUENZA VACCINE  Completed   DEXA SCAN  Completed      01/03/2023    8:20 AM 01/24/2023   12:43 PM 02/20/2023   12:50 PM 03/07/2023    2:38 PM 04/25/2023    1:52 PM  Fall Risk  Falls in the past year? 1 1 1 1 1   Was there an injury with Fall? 1 0 0 1 1  Fall Risk Category Calculator 2 1 1 3 3   Patient at Risk for Falls Due to History of fall(s);Impaired  balance/gait;Impaired mobility History of fall(s) History of fall(s);Impaired mobility History of fall(s);Impaired balance/gait;Impaired mobility History of fall(s);Impaired balance/gait;Impaired mobility  Fall risk Follow up Falls evaluation completed Falls evaluation completed;Education provided;Falls prevention discussed Falls evaluation completed Falls evaluation completed;Education provided;Falls prevention discussed Falls evaluation completed;Education provided;Falls prevention discussed   Functional Status Survey:    Vitals:   05/23/23 1450  BP: 129/84  Pulse: 74  Resp: 14  Temp: (!) 97 F (36.1 C)  SpO2: 97%  Weight: 132 lb 1.6 oz (59.9 kg)  Height: 4\' 8"  (1.422 m)   Body mass index is 29.62 kg/m. Physical Exam Vitals reviewed.  Constitutional:      General: She is not in acute distress. HENT:     Head: Normocephalic.     Right Ear: There is no impacted cerumen.     Left Ear: There is no impacted cerumen.     Nose: Nose normal.     Mouth/Throat:     Mouth: Mucous membranes are moist.  Eyes:     General:        Right eye: No discharge.        Left eye: No discharge.  Cardiovascular:     Rate and Rhythm: Normal rate and regular rhythm.     Pulses: Normal pulses.     Heart sounds: Normal heart sounds.  Pulmonary:     Effort: Pulmonary effort is normal. No respiratory distress.     Breath sounds: Normal breath sounds. No wheezing.  Abdominal:     General: Bowel sounds are normal.     Palpations: Abdomen is soft.  Musculoskeletal:     Cervical back: Neck supple.     Right lower leg: No edema.     Left lower leg: No edema.  Skin:    General: Skin is warm.     Capillary Refill: Capillary refill takes less than 2 seconds.  Neurological:     General: No focal deficit present.     Mental Status: She is alert. Mental status is at baseline.     Motor: Weakness present.     Gait: Gait abnormal.  Psychiatric:        Mood and Affect: Mood normal.     Comments: Alert  to self/familiar face, aphasia appears worse, follows commands, very pleasant     Labs reviewed: Recent Labs    12/04/22 1906 12/11/22 1937 01/30/23 2123 01/30/23 2133 02/23/23 0959 03/23/23 0000  NA 141   < >  137 138 140 144  K 4.1   < > 4.0 3.8 3.7 4.7  CL 106   < > 102 103 101 106  CO2 26   < > 26  --  31 26*  GLUCOSE 96   < > 125* 123* 76  --   BUN 28*   < > 31* 32* 15 18  CREATININE 1.06*   < > 1.03* 1.00 0.94 0.8  CALCIUM 9.6   < > 10.0  --  9.8 9.8  MG 2.5*  --   --   --   --   --    < > = values in this interval not displayed.   Recent Labs    12/11/22 1937 01/30/23 2123 02/23/23 0959  AST 22 28 24   ALT 18 18 15   ALKPHOS 64 55 50  BILITOT 0.9 1.2 1.3*  PROT 7.1 5.7* 5.8*  ALBUMIN 4.2 3.5 3.5   Recent Labs    12/04/22 1906 12/11/22 1937 12/12/22 0859 12/13/22 0411 01/30/23 2123 01/30/23 2133 02/23/23 0959  WBC 8.5 9.1   < > 8.4 8.8  --  8.6  NEUTROABS 5.8 5.9  --   --   --   --  6.5  HGB 15.3* 17.6*   < > 16.5* 14.9 15.3* 15.4*  HCT 45.3 51.4*   < > 49.0* 43.8 45.0 45.7  MCV 92.1 91.8   < > 90.1 90.7  --  90.9  PLT 191 217   < > 208 208  --  208   < > = values in this interval not displayed.   Lab Results  Component Value Date   TSH 3.099 12/12/2022   Lab Results  Component Value Date   HGBA1C 4.8 05/11/2021   Lab Results  Component Value Date   CHOL 152 08/04/2022   HDL 63 08/04/2022   LDLCALC 70 08/04/2022   LDLDIRECT 153.4 11/16/2009   TRIG 91 08/04/2022   CHOLHDL 2.5 05/11/2021    Significant Diagnostic Results in last 30 days:  No results found.  Assessment/Plan 1. Essential hypertension - controlled with metoprolol and hydralazine prn  2. Pure hypercholesterolemia - cont pravastatin  3. Paroxysmal atrial fibrillation (HCC) - HR< 100 with metoprolol - cont Eliquis for clot prevention   4. Weakness generalized - ongoing - 11/15 fall without injury - now in SNF - ambulates with rolator  5. Cognitive impairment -  MMSE 19/30 - no behaviors - aphasia appears worse - weights stable - cont skilled nursing   6. Meningioma (HCC) - now 3.1 x 2.7 cm  - followed by neurosurgery and neurology - no further workup a this time  7. Reactive depression - no mood changes - cont Lexapro  8. Senile osteopenia - DEXA 2023 - cont Prolia injections> scheduled 09/14/2023     Family/ staff Communication: plan discussed with patient and nurse  Labs/tests ordered:  none

## 2023-05-24 DIAGNOSIS — R41841 Cognitive communication deficit: Secondary | ICD-10-CM | POA: Diagnosis not present

## 2023-05-24 DIAGNOSIS — Z8673 Personal history of transient ischemic attack (TIA), and cerebral infarction without residual deficits: Secondary | ICD-10-CM | POA: Diagnosis not present

## 2023-05-29 DIAGNOSIS — Z8673 Personal history of transient ischemic attack (TIA), and cerebral infarction without residual deficits: Secondary | ICD-10-CM | POA: Diagnosis not present

## 2023-05-29 DIAGNOSIS — R41841 Cognitive communication deficit: Secondary | ICD-10-CM | POA: Diagnosis not present

## 2023-05-30 ENCOUNTER — Encounter: Payer: Medicare PPO | Admitting: Internal Medicine

## 2023-05-31 DIAGNOSIS — R41841 Cognitive communication deficit: Secondary | ICD-10-CM | POA: Diagnosis not present

## 2023-05-31 DIAGNOSIS — Z8673 Personal history of transient ischemic attack (TIA), and cerebral infarction without residual deficits: Secondary | ICD-10-CM | POA: Diagnosis not present

## 2023-06-07 DIAGNOSIS — Z8673 Personal history of transient ischemic attack (TIA), and cerebral infarction without residual deficits: Secondary | ICD-10-CM | POA: Diagnosis not present

## 2023-06-07 DIAGNOSIS — R41841 Cognitive communication deficit: Secondary | ICD-10-CM | POA: Diagnosis not present

## 2023-06-08 DIAGNOSIS — R41841 Cognitive communication deficit: Secondary | ICD-10-CM | POA: Diagnosis not present

## 2023-06-08 DIAGNOSIS — Z8673 Personal history of transient ischemic attack (TIA), and cerebral infarction without residual deficits: Secondary | ICD-10-CM | POA: Diagnosis not present

## 2023-06-19 ENCOUNTER — Non-Acute Institutional Stay (SKILLED_NURSING_FACILITY): Payer: Self-pay | Admitting: Internal Medicine

## 2023-06-19 ENCOUNTER — Encounter: Payer: Self-pay | Admitting: Internal Medicine

## 2023-06-19 DIAGNOSIS — Z853 Personal history of malignant neoplasm of breast: Secondary | ICD-10-CM

## 2023-06-19 DIAGNOSIS — I480 Paroxysmal atrial fibrillation: Secondary | ICD-10-CM

## 2023-06-19 DIAGNOSIS — M810 Age-related osteoporosis without current pathological fracture: Secondary | ICD-10-CM | POA: Diagnosis not present

## 2023-06-19 DIAGNOSIS — R4189 Other symptoms and signs involving cognitive functions and awareness: Secondary | ICD-10-CM

## 2023-06-19 DIAGNOSIS — E7800 Pure hypercholesterolemia, unspecified: Secondary | ICD-10-CM

## 2023-06-19 DIAGNOSIS — F329 Major depressive disorder, single episode, unspecified: Secondary | ICD-10-CM

## 2023-06-19 DIAGNOSIS — I10 Essential (primary) hypertension: Secondary | ICD-10-CM | POA: Diagnosis not present

## 2023-06-19 DIAGNOSIS — R296 Repeated falls: Secondary | ICD-10-CM | POA: Diagnosis not present

## 2023-06-19 DIAGNOSIS — D329 Benign neoplasm of meninges, unspecified: Secondary | ICD-10-CM | POA: Diagnosis not present

## 2023-06-19 DIAGNOSIS — R911 Solitary pulmonary nodule: Secondary | ICD-10-CM

## 2023-06-19 NOTE — Progress Notes (Unsigned)
Location:  [facility] Room Number: 127A Place of Service:  SNF [phone]) Provider:  Mahlon Gammon, MD   Mahlon Gammon, MD  Patient Care Team: Mahlon Gammon, MD as PCP - General (Internal Medicine) Thurmon Fair, MD as PCP - Cardiology (Cardiology) Nita Sells, MD as Consulting Physician (Dermatology) Nelson Chimes, MD as Consulting Physician (Ophthalmology) Drema Dallas, DO as Consulting Physician (Neurology) Axel Filler Larna Daughters, NP as Nurse Practitioner (Hematology and Oncology)  Extended Emergency Contact Information Primary Emergency Contact: Ray,Ruth  [place] of [place] Home Phone: [phone] Mobile Phone: [phone] Relation: Daughter Secondary Emergency Contact: Corliss Marcus [place] of [place] Home Phone: [phone] Relation: Son  Code Status:  DNR Goals of care: Advanced Directive information    04/25/2023   10:23 AM  Advanced Directives  Does Patient Have a Medical Advance Directive? Yes  Type of Estate agent of [place];Living will;Out of facility DNR (pink MOST or yellow form)  Does patient want to make changes to medical advance directive? No - Patient declined  Copy of Healthcare Power of Attorney in Chart? Yes - validated most recent copy scanned in chart (See row information)     Chief Complaint  Patient presents with   Medical Management of Chronic Issues    Patient is being seen for a routine visit     HPI:  Pt is a 89 y.o. female seen today for medical management of chronic diseases.   She is now lives in [place] in [place]  Patient has known h/o Meningioma.  MRI it showed meningioma again which has increased in size causing compression on medulla Not Surgical candidate H/o Low Back Pain with Radiculopathy follows with Neurology H/o Breast Cancer Invasive Ductal Carcinoma diagnosed in 2016 On Prolia for Osteoporosis H/o PAF  Follows with Cardiology On Eliquis Also h/o  Bradycardia Hypertension with Tendency for Orthostatic Hypotension   She is doing better in SNF level of care Walks With Walker No Recent Falls Her aphasia is worse. Today her main complain was Depression and being Paranoid about the staff.  Per Nursing she c/o about everything. Gets Paranoid  Confusion in the evening Paces around in the hallways at night Wt Readings from Last 3 Encounters:  06/19/23 132 lb 1.6 oz (59.9 kg)  05/23/23 132 lb 1.6 oz (59.9 kg)  04/25/23 130 lb 6.4 oz (59.1 kg)   Weight is stable Cognition is  worse No Dizziness   Past Medical History:  Diagnosis Date   Allergy    Ankle fracture, left    Atrial fibrillation (HCC)    Per [facility] New Patient Packet    Breast cancer (HCC) 1115/16   left    Bursitis of right shoulder    Cataract    Family history of cancer    Fracture of right wrist    GERD (gastroesophageal reflux disease)    Hyperlipidemia    Hypertension    Lumbar radiculitis    Per [facility] New Patient Packet    M[facility]ioma [facility])    Per [facility] New Patient Packet    Skin cancer 2000   melanoma and basal cell   Stress fracture    Right Heel, Per [facility] New Patient Packet    TIA (transient ischemic attack) 2022   Vaso vagal episode    during preparation for colonoscopy   Past Surgical History:  Procedure Laterality Date   ABDOMINAL HYSTERECTOMY  1988   TAH/BSO--FIBROIDS   APPENDECTOMY     BREAST LUMPECTOMY  B/L--[facility]   BREAST LUMPECTOMY WITH RADIOACTIVE SEED AND SENTINEL LYMPH NODE BIOPSY Left 06/11/2015   Procedure: LEFT BREAST LUMPECTOMY WITH RADIOACTIVE SEED AND LEFT SENTINEL LYMPH NODE MAPPING;  Surgeon: Harriette Bouillon, MD;  Location: [facility] OR;  Service: General;  Laterality: Left;   CARDIOVERSION N/A 01/11/2022   Procedure: CARDIOVERSION;  Surgeon: Jodelle Red, MD;  Location: [facility] ENDOSCOPY;  Service: Cardiovascular;  Laterality: N/A;   CATARACT EXTRACTION  2015   COLONOSCOPY  2010   DG  BONE DENSITY ([facility] HX)     EYE SURGERY  Bilateral    cataracts   fiberadenoma Bilateral 1978, 1980   GANGLION CYST EXCISION     L  hand   Lipiflow procedure      Allergies  Allergen Reactions   Sulfa Antibiotics Other (See Comments)    As a child, became "rigid as a stick and not responsive"   Tape Other (See Comments)    Blisters, Please use "paper" tape only for short periods of time   Wound Dressing Adhesive Other (See Comments)    Blisters, Please use "paper" tape only for short periods of time, Blisters, Please use "paper" tape only for short periods of time   Glucosamine Forte [Nutritional Supplements] Rash   Latex Rash    Outpatient Encounter Medications as of 06/19/2023  Medication Sig   acetaminophen (TYLENOL) 500 MG tablet Take 1,000 mg by mouth in the morning.   acetaminophen (TYLENOL) 500 MG tablet Take 1,000 mg by mouth 2 (two) times daily as needed for moderate pain.   Calcium Carb-Cholecalciferol (CALCIUM 600+D3) 600-10 MG-MCG TABS Take 1 tablet by mouth in the morning.   Cholecalciferol (VITAMIN D3) 1000 UNITS CAPS Take 1,000 Units by mouth in the morning.   cycloSPORINE (RESTASIS) 0.05 % ophthalmic emulsion Place 1 drop into both eyes 2 (two) times daily.  after applying a hot compress   ELIQUIS 5 MG TABS tablet TAKE ONE TABLET BY MOUTH TWICE DAILY   escitalopram (LEXAPRO) 5 MG tablet Take 20 mg by mouth in the morning.   hydrALAZINE (APRESOLINE) 10 MG tablet Take 10 mg by mouth every 8 (eight) hours as needed.   metoprolol succinate (TOPROL XL) 25 MG 24 hr tablet Take 25 mg every morning   polyethylene glycol (MIRALAX / GLYCOLAX) 17 g packet Take 17 g by mouth in the morning.   pravastatin (PRAVACHOL) 40 MG tablet TAKE ONE TABLET BY MOUTH ONCE DAILY   senna (SENOKOT) 8.6 MG TABS tablet Take 2 tablets (17.2 mg total) by mouth at bedtime.   sodium chloride (MURO 128) 5 % ophthalmic ointment Place 1 Application into both eyes at bedtime.   vitamin B-12 (CYANOCOBALAMIN) 1000 MCG tablet Take 1 tablet (1,000  mcg total) by mouth daily.   lidocaine 4 % Place 1 patch onto the skin daily. (Patient not taking: Reported on 06/19/2023)   loperamide (IMODIUM A-D) 2 MG tablet Take 2 mg by mouth 3 (three) times daily as needed for diarrhea or loose stools. (Patient not taking: Reported on 06/19/2023)   Omega-3 Fatty Acids (FISH OIL PO) Take 1 capsule by mouth in the morning. 350 mg-235 mg-90 mg-597 mg (Patient not taking: Reported on 06/19/2023)   Turmeric 500 MG CAPS Take 500 mg by mouth in the morning. (Patient not taking: Reported on 06/19/2023)   No facility-administered encounter medications on file as of 06/19/2023.    Review of Systems  Constitutional:  Negative for activity change and appetite change.  HENT: Negative.    Respiratory:  Negative for cough and shortness of breath.   Cardiovascular:  Negative for leg swelling.  Gastrointestinal:  Negative for constipation.  Genitourinary: Negative.   Musculoskeletal:  Positive for gait problem. Negative for arthralgias and myalgias.  Skin: Negative.   Neurological:  Negative for dizziness and weakness.  Psychiatric/Behavioral:  Positive for confusion and dysphoric mood. Negative for sleep disturbance. The patient is nervous/anxious.     Immunization History  Administered Date(s) Administered   Fluad Quad(high Dose 65+) 02/27/2020, 03/29/2022   H1N1 07/01/2008   Influenza Whole 04/10/2007, 04/01/2009, 03/26/2010, 03/25/2012   Influenza, High Dose Seasonal PF 04/16/2014, 04/09/2015, 04/03/2017, 04/19/2018, 02/28/2019, 03/08/2021, 04/18/2023   Influenza-Unspecified 06/27/2016, 04/03/2017   Moderna Sars-Covid-2 Vaccination 04/27/2022   PFIZER Comirnaty(Gray Top)Covid-19 Tri-Sucrose Vaccine 01/05/2021   PFIZER(Purple Top)SARS-COV-2 Vaccination 07/18/2019, 08/08/2019, 03/29/2020   Pneumococcal Conjugate-13 05/13/2014   Pneumococcal Polysaccharide-23 08/29/2016   Td 11/05/2003   Tdap 06/28/2011, 09/19/2011, 10/23/2020   Tetanus 10/23/2020    Unspecified SARS-COV-2 Vaccination 04/18/2023   Zoster Recombinant(Shingrix) 10/08/2016, 01/03/2017   Zoster, Live 02/24/2010   Pertinent  Health Maintenance Due  Topic Date Due   MAMMOGRAM  07/12/2023   INFLUENZA VACCINE  Completed   DEXA SCAN  Completed      02/20/2023   12:50 PM 03/07/2023    2:38 PM 04/25/2023    1:52 PM 05/23/2023    3:02 PM 06/19/2023    4:14 PM  Fall Risk  Falls in the past year? 1 1 1 1  0  Was there an injury with Fall? 0 1 1 1  0  Fall Risk Category Calculator 1 3 3 3  0  Patient at Risk for Falls Due to History of fall(s);Impaired mobility History of fall(s);Impaired balance/gait;Impaired mobility History of fall(s);Impaired balance/gait;Impaired mobility History of fall(s);Impaired balance/gait;Impaired mobility No Fall Risks  Fall risk Follow up Falls evaluation completed Falls evaluation completed;Education provided;Falls prevention discussed Falls evaluation completed;Education provided;Falls prevention discussed Falls evaluation completed;Education provided;Falls prevention discussed Falls evaluation completed   Functional Status Survey:    Vitals:   06/19/23 1612  BP: 110/62  Pulse: 61  Resp: 17  Temp: (!) 97 F (36.1 C)  TempSrc: Temporal  SpO2: 97%  Weight: 132 lb 1.6 oz (59.9 kg)  Height: 4\' 8"  (1.422 m)   Body mass index is 29.62 kg/m. Physical Exam Vitals reviewed.  Constitutional:      Appearance: Normal appearance.  HENT:     Head: Normocephalic.     Nose: Nose normal.     Mouth/Throat:     Mouth: Mucous membranes are moist.     Pharynx: Oropharynx is clear.  Eyes:     Pupils: Pupils are equal, round, and reactive to light.  Cardiovascular:     Rate and Rhythm: Regular rhythm. Bradycardia present.     Pulses: Normal pulses.     Heart sounds: Normal heart sounds. No murmur heard. Pulmonary:     Effort: Pulmonary effort is normal.     Breath sounds: Normal breath sounds.  Abdominal:     General: Abdomen is flat. Bowel  sounds are normal.     Palpations: Abdomen is soft.  Musculoskeletal:        General: No swelling.     Cervical back: Neck supple.  Skin:    General: Skin is warm.  Neurological:     General: No focal deficit present.     Mental Status: She is alert.  Psychiatric:        Mood and Affect: Mood normal.  Thought Content: Thought content normal.     Labs reviewed: Recent Labs    12/04/22 1906 12/11/22 1937 01/30/23 2123 01/30/23 2133 02/23/23 0959 03/23/23 0000  NA 141   < > 137 138 140 144  K 4.1   < > 4.0 3.8 3.7 4.7  CL 106   < > 102 103 101 106  CO2 26   < > 26  --  31 26*  GLUCOSE 96   < > 125* 123* 76  --   BUN 28*   < > 31* 32* 15 18  CREATININE 1.06*   < > 1.03* 1.00 0.94 0.8  CALCIUM 9.6   < > 10.0  --  9.8 9.8  MG 2.5*  --   --   --   --   --    < > = values in this interval not displayed.   Recent Labs    12/11/22 1937 01/30/23 2123 02/23/23 0959  AST 22 28 24   ALT 18 18 15   ALKPHOS 64 55 50  BILITOT 0.9 1.2 1.3*  PROT 7.1 5.7* 5.8*  ALBUMIN 4.2 3.5 3.5   Recent Labs    12/04/22 1906 12/11/22 1937 12/12/22 0859 12/13/22 0411 01/30/23 2123 01/30/23 2133 02/23/23 0959  WBC 8.5 9.1   < > 8.4 8.8  --  8.6  NEUTROABS 5.8 5.9  --   --   --   --  6.5  HGB 15.3* 17.6*   < > 16.5* 14.9 15.3* 15.4*  HCT 45.3 51.4*   < > 49.0* 43.8 45.0 45.7  MCV 92.1 91.8   < > 90.1 90.7  --  90.9  PLT 191 217   < > 208 208  --  208   < > = values in this interval not displayed.   Lab Results  Component Value Date   TSH 3.099 12/12/2022   Lab Results  Component Value Date   HGBA1C 4.8 05/11/2021   Lab Results  Component Value Date   CHOL 152 08/04/2022   HDL 63 08/04/2022   LDLCALC 70 08/04/2022   LDLDIRECT 153.4 11/16/2009   TRIG 91 08/04/2022   CHOLHDL 2.5 05/11/2021    Significant Diagnostic Results in last 30 days:  No results found.  Assessment/Plan 1. Essential hypertension (Primary) On Metoprolol Loose Control due to her h/o  Falls  2. Pure hypercholesterolemia On statin LDL 70 in 2/24  3. Paroxysmal atrial fibrillation (HCC) Eliquis and Metoprolol  4. Cognitive impairment Recent Worsening MMSE 19/30  5. Meningioma (HCC) No More Work up   6. Reactive depression Will add Remeron 7.5 mg at night to help with depression Already on Lexapro  7. Frequent falls Doing well in SNF  8. Age-related osteoporosis without current pathological fracture Prolia per [facility] Dec and March  9. History of breast cancer In Remission Follow with Oncology PRN Annually Mammogram  10. Lung nodule No Further Follow up   11 Chronic Back pain Tylenol  Family/ staff Communication:   Labs/tests ordered:

## 2023-06-26 ENCOUNTER — Encounter: Payer: Self-pay | Admitting: Adult Health

## 2023-06-26 ENCOUNTER — Non-Acute Institutional Stay (SKILLED_NURSING_FACILITY): Payer: Self-pay | Admitting: Adult Health

## 2023-06-26 DIAGNOSIS — F03911 Unspecified dementia, unspecified severity, with agitation: Secondary | ICD-10-CM | POA: Diagnosis not present

## 2023-06-26 MED ORDER — HYDROXYZINE HCL 10 MG PO TABS: 10.0000 mg | ORAL_TABLET | Freq: Two times a day (BID) | ORAL | Status: DC | PRN

## 2023-07-12 DIAGNOSIS — L821 Other seborrheic keratosis: Secondary | ICD-10-CM | POA: Diagnosis not present

## 2023-07-12 DIAGNOSIS — L905 Scar conditions and fibrosis of skin: Secondary | ICD-10-CM | POA: Diagnosis not present

## 2023-07-12 DIAGNOSIS — L814 Other melanin hyperpigmentation: Secondary | ICD-10-CM | POA: Diagnosis not present

## 2023-07-12 DIAGNOSIS — D1801 Hemangioma of skin and subcutaneous tissue: Secondary | ICD-10-CM | POA: Diagnosis not present

## 2023-07-19 DIAGNOSIS — M62561 Muscle wasting and atrophy, not elsewhere classified, right lower leg: Secondary | ICD-10-CM | POA: Diagnosis not present

## 2023-07-19 DIAGNOSIS — Z9181 History of falling: Secondary | ICD-10-CM | POA: Diagnosis not present

## 2023-07-19 DIAGNOSIS — R2689 Other abnormalities of gait and mobility: Secondary | ICD-10-CM | POA: Diagnosis not present

## 2023-07-19 DIAGNOSIS — M62562 Muscle wasting and atrophy, not elsewhere classified, left lower leg: Secondary | ICD-10-CM | POA: Diagnosis not present

## 2023-07-20 ENCOUNTER — Non-Acute Institutional Stay (SKILLED_NURSING_FACILITY): Payer: Medicare PPO | Admitting: Adult Health

## 2023-07-20 DIAGNOSIS — D329 Benign neoplasm of meninges, unspecified: Secondary | ICD-10-CM

## 2023-07-20 DIAGNOSIS — I480 Paroxysmal atrial fibrillation: Secondary | ICD-10-CM | POA: Diagnosis not present

## 2023-07-20 DIAGNOSIS — R2689 Other abnormalities of gait and mobility: Secondary | ICD-10-CM | POA: Diagnosis not present

## 2023-07-20 DIAGNOSIS — M62561 Muscle wasting and atrophy, not elsewhere classified, right lower leg: Secondary | ICD-10-CM | POA: Diagnosis not present

## 2023-07-20 DIAGNOSIS — M810 Age-related osteoporosis without current pathological fracture: Secondary | ICD-10-CM

## 2023-07-20 DIAGNOSIS — Z9181 History of falling: Secondary | ICD-10-CM | POA: Diagnosis not present

## 2023-07-20 DIAGNOSIS — M62562 Muscle wasting and atrophy, not elsewhere classified, left lower leg: Secondary | ICD-10-CM | POA: Diagnosis not present

## 2023-07-20 DIAGNOSIS — R269 Unspecified abnormalities of gait and mobility: Secondary | ICD-10-CM

## 2023-07-20 DIAGNOSIS — F329 Major depressive disorder, single episode, unspecified: Secondary | ICD-10-CM | POA: Diagnosis not present

## 2023-07-21 ENCOUNTER — Encounter: Payer: Self-pay | Admitting: Adult Health

## 2023-07-21 DIAGNOSIS — Z9181 History of falling: Secondary | ICD-10-CM | POA: Diagnosis not present

## 2023-07-21 DIAGNOSIS — R2689 Other abnormalities of gait and mobility: Secondary | ICD-10-CM | POA: Diagnosis not present

## 2023-07-21 DIAGNOSIS — M62561 Muscle wasting and atrophy, not elsewhere classified, right lower leg: Secondary | ICD-10-CM | POA: Diagnosis not present

## 2023-07-21 DIAGNOSIS — M62562 Muscle wasting and atrophy, not elsewhere classified, left lower leg: Secondary | ICD-10-CM | POA: Diagnosis not present

## 2023-07-21 NOTE — Progress Notes (Signed)
Location:  Medical illustrator of Service:  SNF (31) Provider:   Peggye Ley, ANP [facility] [phone]   Mahlon Gammon, MD  Patient Care Team: Mahlon Gammon, MD as PCP - General (Internal Medicine) Thurmon Fair, MD as PCP - Cardiology (Cardiology) Nita Sells, MD as Consulting Physician (Dermatology) Nelson Chimes, MD as Consulting Physician (Ophthalmology) Drema Dallas, DO as Consulting Physician (Neurology) Axel Filler Larna Daughters, NP as Nurse Practitioner (Hematology and Oncology)  Extended Emergency Contact Information Primary Emergency Contact: Ray,Ruth  [place] of [place] Home Phone: [phone] Mobile Phone: [phone] Relation: Daughter Secondary Emergency Contact: Corliss Marcus [place] of [place] Home Phone: [phone] Relation: Son  Code Status:  DNR Goals of care: Advanced Directive information    06/26/2023   11:16 AM  Advanced Directives  Does Patient Have a Medical Advance Directive? Yes  Type of Estate agent of [place];Living will;Out of facility DNR (pink MOST or yellow form)  Does patient want to make changes to medical advance directive? No - Patient declined  Copy of Healthcare Power of Attorney in Chart? Yes - validated most recent copy scanned in chart (See row information)     Chief Complaint  Patient presents with   Medical Management of Chronic Issues    HPI:  Pt is a 89 y.o. female seen today for medical management  Ms. Zaldivar resides in skilled rehab due to progressive memory loss and gait instability.  She is having more falls. There is unsteady gait and poor safety awareness. She is working with therapy. No acute complaints.   Recently ordered vistaril for agitation and remeron for mood. Also on lexapro. She is having some improvement in mood. Tends to occur more in the evening with agitation.   Back ground: PMH significant for CVA, afib, HTN,  gait abnormality, HLD, orthostatic hypotension, bradycardia, lumbar radiculitis, meningioma, osteopenia, and breast cancer, meningioma.  Dr Everlena Cooper is her neurologist. He saw her in [place] 12 she was having leg paresthesia. MRI recommended  MRI 12/12/22 showed increasing size of the meningioma. No surgical intervention recommended.  Memory loss: worsening over time.  Did poorly in serial 7s and recall on MMSE 22/30 08/21/22 Had trouble spelling world backwards.   Had cardioversion for afib 01/11/22 Had some bradycardia which did improve off cardizem 2nd degree AV block on zio patch July 2023 Afib controlled on Eliquis follows with cardiology on metoprolol  Mammogram 07/11/22 normal, family declined further mammograms Dexa scan 07/21/21 t score -1.1 on Prolia. Last injection 03/16/23  Meningioma: MRI of the brain 11/04/21 showed stable appearance.   follows with neurosurgery   H/o left Breast Cancer Invasive Ductal Carcinoma diagnosed in 2016 S/p lumpectomy, chemo, radiation  Past Medical History:  Diagnosis Date   Allergy    Ankle fracture, left    Atrial fibrillation (HCC)    Per [facility] New Patient Packet    Breast cancer (HCC) 1115/16   left    Bursitis of right shoulder    Cataract    Family history of cancer    Fracture of right wrist    GERD (gastroesophageal reflux disease)    Hyperlipidemia    Hypertension    Lumbar radiculitis    Per [facility] New Patient Packet    Meningioma [facility])    Per [facility] New Patient Packet    Skin cancer 2000   melanoma and basal cell   Stress fracture    Right Heel, Per [facility] New Patient Packet  TIA (transient ischemic attack) 2022   Vaso vagal episode    during preparation for colonoscopy   Past Surgical History:  Procedure Laterality Date   ABDOMINAL HYSTERECTOMY  1988   TAH/BSO--FIBROIDS   APPENDECTOMY     BREAST LUMPECTOMY     B/L--[facility]   BREAST LUMPECTOMY WITH RADIOACTIVE SEED AND SENTINEL LYMPH NODE BIOPSY Left 06/11/2015   Procedure: LEFT  BREAST LUMPECTOMY WITH RADIOACTIVE SEED AND LEFT SENTINEL LYMPH NODE MAPPING;  Surgeon: Harriette Bouillon, MD;  Location: [facility] OR;  Service: General;  Laterality: Left;   CARDIOVERSION N/A 01/11/2022   Procedure: CARDIOVERSION;  Surgeon: Jodelle Red, MD;  [facility] ENDOSCOPY;  Service: Cardiovascular;  Laterality: N/A;   CATARACT EXTRACTION  2015   COLONOSCOPY  2010   DG  BONE DENSITY ([facility] HX)     EYE SURGERY Bilateral    cataracts   fiberadenoma Bilateral 1978, 1980   GANGLION CYST EXCISION     L  hand   Lipiflow procedure      Allergies  Allergen Reactions   Sulfa Antibiotics Other (See Comments)    As a child, became "rigid as a stick and not responsive"   Tape Other (See Comments)    Blisters, Please use "paper" tape only for short periods of time   Wound Dressing Adhesive Other (See Comments)    Blisters, Please use "paper" tape only for short periods of time, Blisters, Please use "paper" tape only for short periods of time   Glucosamine Forte [Nutritional Supplements] Rash   Latex Rash    Outpatient Encounter Medications as of 07/20/2023  Medication Sig   denosumab (PROLIA) 60 MG/ML SOSY injection Inject 60 mg into the skin every 6 (six) months.   acetaminophen (TYLENOL) 500 MG tablet Take 1,000 mg by mouth in the morning.   acetaminophen (TYLENOL) 500 MG tablet Take 1,000 mg by mouth 2 (two) times daily as needed for moderate pain.   Calcium Carb-Cholecalciferol (CALCIUM 600+D3) 600-10 MG-MCG TABS Take 1 tablet by mouth in the morning.   Cholecalciferol (VITAMIN D3) 1000 UNITS CAPS Take 1,000 Units by mouth in the morning.   cycloSPORINE (RESTASIS) 0.05 % ophthalmic emulsion Place 1 drop into both eyes 2 (two) times daily.  after applying a hot compress   ELIQUIS 5 MG TABS tablet TAKE ONE TABLET BY MOUTH TWICE DAILY   escitalopram (LEXAPRO) 5 MG tablet Take 20 mg by mouth in the morning.   hydrALAZINE (APRESOLINE) 10 MG tablet Take 10 mg by mouth every 8 (eight)  hours as needed.   hydrOXYzine (ATARAX) 10 MG tablet Take 1 tablet (10 mg total) by mouth 2 (two) times daily as needed.   metoprolol succinate (TOPROL XL) 25 MG 24 hr tablet Take 25 mg every morning   mirtazapine (REMERON) 7.5 MG tablet Take 15 mg by mouth at bedtime.   polyethylene glycol (MIRALAX / GLYCOLAX) 17 g packet Take 17 g by mouth in the morning.   pravastatin (PRAVACHOL) 40 MG tablet TAKE ONE TABLET BY MOUTH ONCE DAILY   senna (SENOKOT) 8.6 MG TABS tablet Take 2 tablets (17.2 mg total) by mouth at bedtime.   sodium chloride (MURO 128) 5 % ophthalmic ointment Place 1 Application into both eyes at bedtime.   vitamin B-12 (CYANOCOBALAMIN) 1000 MCG tablet Take 1 tablet (1,000 mcg total) by mouth daily.   No facility-administered encounter medications on file as of 07/20/2023.    Review of Systems  Constitutional:  Negative for activity change, appetite change, chills, diaphoresis, fatigue,  fever and unexpected weight change.  HENT:  Negative for congestion and rhinorrhea.   Eyes:        Worsening vision  Respiratory:  Negative for cough, shortness of breath and wheezing.   Cardiovascular:  Negative for chest pain, palpitations and leg swelling.  Gastrointestinal:  Negative for abdominal distention, abdominal pain, constipation and diarrhea.  Genitourinary:  Negative for difficulty urinating and dysuria.  Musculoskeletal:  Positive for gait problem. Negative for arthralgias, back pain, joint swelling and myalgias.  Neurological:  Negative for dizziness, tremors, seizures, syncope, facial asymmetry, speech difficulty, weakness, light-headedness, numbness and headaches.  Psychiatric/Behavioral:  Positive for confusion. Negative for agitation and behavioral problems. The patient is nervous/anxious.     Immunization History  Administered Date(s) Administered   Fluad Quad(high Dose 65+) 02/27/2020, 03/29/2022   H1N1 07/01/2008   Influenza Whole 04/10/2007, 04/01/2009, 03/26/2010,  03/25/2012   Influenza, High Dose Seasonal PF 04/16/2014, 04/09/2015, 04/03/2017, 04/19/2018, 02/28/2019, 03/08/2021, 04/18/2023   Influenza-Unspecified 06/27/2016, 04/03/2017   Moderna Sars-Covid-2 Vaccination 04/27/2022   PFIZER Comirnaty(Gray Top)Covid-19 Tri-Sucrose Vaccine 01/05/2021   PFIZER(Purple Top)SARS-COV-2 Vaccination 07/18/2019, 08/08/2019, 03/29/2020   Pneumococcal Conjugate-13 05/13/2014   Pneumococcal Polysaccharide-23 08/29/2016   Td 11/05/2003   Tdap 06/28/2011, 09/19/2011, 10/23/2020   Tetanus 10/23/2020   Unspecified SARS-COV-2 Vaccination 04/18/2023   Zoster Recombinant(Shingrix) 10/08/2016, 01/03/2017   Zoster, Live 02/24/2010   Pertinent  Health Maintenance Due  Topic Date Due   MAMMOGRAM  07/20/2024 (Originally 07/12/2023)   INFLUENZA VACCINE  Completed   DEXA SCAN  Completed      02/20/2023   12:50 PM 03/07/2023    2:38 PM 04/25/2023    1:52 PM 05/23/2023    3:02 PM 06/19/2023    4:14 PM  Fall Risk  Falls in the past year? 1 1 1 1  0  Was there an injury with Fall? 0 1 1 1  0  Fall Risk Category Calculator 1 3 3 3  0  Patient at Risk for Falls Due to History of fall(s);Impaired mobility History of fall(s);Impaired balance/gait;Impaired mobility History of fall(s);Impaired balance/gait;Impaired mobility History of fall(s);Impaired balance/gait;Impaired mobility No Fall Risks  Fall risk Follow up Falls evaluation completed Falls evaluation completed;Education provided;Falls prevention discussed Falls evaluation completed;Education provided;Falls prevention discussed Falls evaluation completed;Education provided;Falls prevention discussed Falls evaluation completed   Functional Status Survey:    Vitals:   07/21/23 0738  BP: (!) 110/44  Pulse: 75  Resp: 20  Temp: (!) 96.9 F (36.1 C)  Weight: 134 lb 12.8 oz (61.1 kg)   Body mass index is 30.22 kg/m. Physical Exam Vitals and nursing note reviewed.  Constitutional:      General: She is not in acute  distress.    Appearance: She is not diaphoretic.  HENT:     Head: Normocephalic and atraumatic.     Nose: No congestion.     Mouth/Throat:     Mouth: Mucous membranes are moist.     Pharynx: Oropharynx is clear.  Eyes:     Conjunctiva/sclera: Conjunctivae normal.     Pupils: Pupils are equal, round, and reactive to light.  Neck:     Vascular: No JVD.  Cardiovascular:     Rate and Rhythm: Normal rate and regular rhythm.     Heart sounds: No murmur heard. Pulmonary:     Effort: Pulmonary effort is normal. No respiratory distress.     Breath sounds: Normal breath sounds. No wheezing.  Abdominal:     General: Bowel sounds are normal. There is no distension.  Palpations: Abdomen is soft.     Tenderness: There is no abdominal tenderness.  Skin:    General: Skin is warm and dry.     Comments: Ecchymosis right hand dorsum  Neurological:     General: No focal deficit present.     Mental Status: She is alert. Mental status is at baseline.  Psychiatric:        Mood and Affect: Mood normal.     Labs reviewed: Recent Labs    12/04/22 1906 12/11/22 1937 01/30/23 2123 01/30/23 2133 02/23/23 0959 03/23/23 0000  NA 141   < > 137 138 140 144  K 4.1   < > 4.0 3.8 3.7 4.7  CL 106   < > 102 103 101 106  CO2 26   < > 26  --  31 26*  GLUCOSE 96   < > 125* 123* 76  --   BUN 28*   < > 31* 32* 15 18  CREATININE 1.06*   < > 1.03* 1.00 0.94 0.8  CALCIUM 9.6   < > 10.0  --  9.8 9.8  MG 2.5*  --   --   --   --   --    < > = values in this interval not displayed.   Recent Labs    12/11/22 1937 01/30/23 2123 02/23/23 0959  AST 22 28 24   ALT 18 18 15   ALKPHOS 64 55 50  BILITOT 0.9 1.2 1.3*  PROT 7.1 5.7* 5.8*  ALBUMIN 4.2 3.5 3.5   Recent Labs    12/04/22 1906 12/11/22 1937 12/12/22 0859 12/13/22 0411 01/30/23 2123 01/30/23 2133 02/23/23 0959  WBC 8.5 9.1   < > 8.4 8.8  --  8.6  NEUTROABS 5.8 5.9  --   --   --   --  6.5  HGB 15.3* 17.6*   < > 16.5* 14.9 15.3* 15.4*   HCT 45.3 51.4*   < > 49.0* 43.8 45.0 45.7  MCV 92.1 91.8   < > 90.1 90.7  --  90.9  PLT 191 217   < > 208 208  --  208   < > = values in this interval not displayed.   Lab Results  Component Value Date   TSH 3.099 12/12/2022   Lab Results  Component Value Date   HGBA1C 4.8 05/11/2021   Lab Results  Component Value Date   CHOL 152 08/04/2022   HDL 63 08/04/2022   LDLCALC 70 08/04/2022   LDLDIRECT 153.4 11/16/2009   TRIG 91 08/04/2022   CHOLHDL 2.5 05/11/2021    Significant Diagnostic Results in last 30 days:  No results found.  Assessment/Plan  1. Gait abnormality (Primary) Working with therapy  2. Age-related osteoporosis without current pathological fracture On Prolia   3. Paroxysmal atrial fibrillation (HCC) Rate is controlled  On Eliquis On metoprolol   4. Meningioma (HCC) Progressive visual loss and gait issues.  Not a surgical candidate.   5. Reactive depression Improving, on remeron and lexapro   Family/ staff Communication: nurse  Labs/tests ordered:  NA

## 2023-07-23 DIAGNOSIS — Z9181 History of falling: Secondary | ICD-10-CM | POA: Diagnosis not present

## 2023-07-23 DIAGNOSIS — M62562 Muscle wasting and atrophy, not elsewhere classified, left lower leg: Secondary | ICD-10-CM | POA: Diagnosis not present

## 2023-07-23 DIAGNOSIS — R2689 Other abnormalities of gait and mobility: Secondary | ICD-10-CM | POA: Diagnosis not present

## 2023-07-23 DIAGNOSIS — M62561 Muscle wasting and atrophy, not elsewhere classified, right lower leg: Secondary | ICD-10-CM | POA: Diagnosis not present

## 2023-07-25 DIAGNOSIS — M62561 Muscle wasting and atrophy, not elsewhere classified, right lower leg: Secondary | ICD-10-CM | POA: Diagnosis not present

## 2023-07-25 DIAGNOSIS — M62562 Muscle wasting and atrophy, not elsewhere classified, left lower leg: Secondary | ICD-10-CM | POA: Diagnosis not present

## 2023-07-25 DIAGNOSIS — R2689 Other abnormalities of gait and mobility: Secondary | ICD-10-CM | POA: Diagnosis not present

## 2023-07-25 DIAGNOSIS — Z9181 History of falling: Secondary | ICD-10-CM | POA: Diagnosis not present

## 2023-07-26 DIAGNOSIS — M62561 Muscle wasting and atrophy, not elsewhere classified, right lower leg: Secondary | ICD-10-CM | POA: Diagnosis not present

## 2023-07-26 DIAGNOSIS — R2689 Other abnormalities of gait and mobility: Secondary | ICD-10-CM | POA: Diagnosis not present

## 2023-07-26 DIAGNOSIS — Z9181 History of falling: Secondary | ICD-10-CM | POA: Diagnosis not present

## 2023-07-26 DIAGNOSIS — M62562 Muscle wasting and atrophy, not elsewhere classified, left lower leg: Secondary | ICD-10-CM | POA: Diagnosis not present

## 2023-07-27 DIAGNOSIS — R2689 Other abnormalities of gait and mobility: Secondary | ICD-10-CM | POA: Diagnosis not present

## 2023-07-27 DIAGNOSIS — Z9181 History of falling: Secondary | ICD-10-CM | POA: Diagnosis not present

## 2023-07-27 DIAGNOSIS — M62561 Muscle wasting and atrophy, not elsewhere classified, right lower leg: Secondary | ICD-10-CM | POA: Diagnosis not present

## 2023-07-27 DIAGNOSIS — M62562 Muscle wasting and atrophy, not elsewhere classified, left lower leg: Secondary | ICD-10-CM | POA: Diagnosis not present

## 2023-07-28 DIAGNOSIS — R2689 Other abnormalities of gait and mobility: Secondary | ICD-10-CM | POA: Diagnosis not present

## 2023-07-28 DIAGNOSIS — M62561 Muscle wasting and atrophy, not elsewhere classified, right lower leg: Secondary | ICD-10-CM | POA: Diagnosis not present

## 2023-07-28 DIAGNOSIS — M62562 Muscle wasting and atrophy, not elsewhere classified, left lower leg: Secondary | ICD-10-CM | POA: Diagnosis not present

## 2023-07-28 DIAGNOSIS — Z9181 History of falling: Secondary | ICD-10-CM | POA: Diagnosis not present

## 2023-07-31 ENCOUNTER — Telehealth: Payer: Self-pay | Admitting: Cardiovascular Disease

## 2023-07-31 DIAGNOSIS — M62561 Muscle wasting and atrophy, not elsewhere classified, right lower leg: Secondary | ICD-10-CM | POA: Diagnosis not present

## 2023-07-31 DIAGNOSIS — Z9181 History of falling: Secondary | ICD-10-CM | POA: Diagnosis not present

## 2023-07-31 DIAGNOSIS — R2689 Other abnormalities of gait and mobility: Secondary | ICD-10-CM | POA: Diagnosis not present

## 2023-07-31 DIAGNOSIS — M62562 Muscle wasting and atrophy, not elsewhere classified, left lower leg: Secondary | ICD-10-CM | POA: Diagnosis not present

## 2023-07-31 NOTE — Telephone Encounter (Signed)
Spoke with Scientist, clinical (histocompatibility and immunogenetics). Advised no med changes. Appointment is needed.   Scheduled for 08/15/23 @ 1:20pm Asked that daughter be contacted to be in attendance

## 2023-07-31 NOTE — Telephone Encounter (Signed)
Pt c/o medication issue:  1. Name of Medication: ELIQUIS 5 MG TABS tablet   2. How are you currently taking this medication (dosage and times per day)? Currently being held by nurse due to falls  3. Are you having a reaction (difficulty breathing--STAT)? No  4. What is your medication issue? Pt has been having a lot of falls especially in the evening. 2 falls in 2 days per nurse. Requesting cb  to see if pt ntbs or please advise

## 2023-07-31 NOTE — Telephone Encounter (Signed)
Returned call to staff at [facility]. She has had two falls in two days. She is on eliquis 5mg  BID. Tammy held her eliquis today.   She falls occur in the evenings. Possibly she is tripping over her pants. Possibly her legs are giving out (getting PT).   Patient complains of being dizzy at times but BP is "fine"  Patient has NOT hit her head.  She slides off commode, slides off bed.   She has bruising to left hip, skin tear to left elbow.   Advised will send message to MD to review - also pharmacy team

## 2023-07-31 NOTE — Telephone Encounter (Signed)
Thanks.  This is always a difficult combination of problems: Atrial fibrillation and falls.  She is actually due for her yearly office follow-up.  I would continue the same treatment for the time being but have her come in, ideally with a family member present at the time.

## 2023-08-01 DIAGNOSIS — L84 Corns and callosities: Secondary | ICD-10-CM | POA: Diagnosis not present

## 2023-08-01 DIAGNOSIS — R531 Weakness: Secondary | ICD-10-CM | POA: Diagnosis not present

## 2023-08-01 DIAGNOSIS — M2041 Other hammer toe(s) (acquired), right foot: Secondary | ICD-10-CM | POA: Diagnosis not present

## 2023-08-01 DIAGNOSIS — Z9181 History of falling: Secondary | ICD-10-CM | POA: Diagnosis not present

## 2023-08-01 DIAGNOSIS — L602 Onychogryphosis: Secondary | ICD-10-CM | POA: Diagnosis not present

## 2023-08-01 DIAGNOSIS — M2042 Other hammer toe(s) (acquired), left foot: Secondary | ICD-10-CM | POA: Diagnosis not present

## 2023-08-01 DIAGNOSIS — B351 Tinea unguium: Secondary | ICD-10-CM | POA: Diagnosis not present

## 2023-08-01 DIAGNOSIS — M79671 Pain in right foot: Secondary | ICD-10-CM | POA: Diagnosis not present

## 2023-08-01 DIAGNOSIS — R2689 Other abnormalities of gait and mobility: Secondary | ICD-10-CM | POA: Diagnosis not present

## 2023-08-01 DIAGNOSIS — M62561 Muscle wasting and atrophy, not elsewhere classified, right lower leg: Secondary | ICD-10-CM | POA: Diagnosis not present

## 2023-08-01 DIAGNOSIS — R278 Other lack of coordination: Secondary | ICD-10-CM | POA: Diagnosis not present

## 2023-08-01 DIAGNOSIS — M62562 Muscle wasting and atrophy, not elsewhere classified, left lower leg: Secondary | ICD-10-CM | POA: Diagnosis not present

## 2023-08-01 DIAGNOSIS — M79672 Pain in left foot: Secondary | ICD-10-CM | POA: Diagnosis not present

## 2023-08-02 DIAGNOSIS — M62561 Muscle wasting and atrophy, not elsewhere classified, right lower leg: Secondary | ICD-10-CM | POA: Diagnosis not present

## 2023-08-02 DIAGNOSIS — R2689 Other abnormalities of gait and mobility: Secondary | ICD-10-CM | POA: Diagnosis not present

## 2023-08-02 DIAGNOSIS — Z9181 History of falling: Secondary | ICD-10-CM | POA: Diagnosis not present

## 2023-08-02 DIAGNOSIS — M62562 Muscle wasting and atrophy, not elsewhere classified, left lower leg: Secondary | ICD-10-CM | POA: Diagnosis not present

## 2023-08-03 DIAGNOSIS — Z9181 History of falling: Secondary | ICD-10-CM | POA: Diagnosis not present

## 2023-08-03 DIAGNOSIS — R278 Other lack of coordination: Secondary | ICD-10-CM | POA: Diagnosis not present

## 2023-08-03 DIAGNOSIS — R531 Weakness: Secondary | ICD-10-CM | POA: Diagnosis not present

## 2023-08-03 DIAGNOSIS — M62562 Muscle wasting and atrophy, not elsewhere classified, left lower leg: Secondary | ICD-10-CM | POA: Diagnosis not present

## 2023-08-03 DIAGNOSIS — R2689 Other abnormalities of gait and mobility: Secondary | ICD-10-CM | POA: Diagnosis not present

## 2023-08-03 DIAGNOSIS — M62561 Muscle wasting and atrophy, not elsewhere classified, right lower leg: Secondary | ICD-10-CM | POA: Diagnosis not present

## 2023-08-04 DIAGNOSIS — R278 Other lack of coordination: Secondary | ICD-10-CM | POA: Diagnosis not present

## 2023-08-04 DIAGNOSIS — Z9181 History of falling: Secondary | ICD-10-CM | POA: Diagnosis not present

## 2023-08-04 DIAGNOSIS — R531 Weakness: Secondary | ICD-10-CM | POA: Diagnosis not present

## 2023-08-07 DIAGNOSIS — Z9181 History of falling: Secondary | ICD-10-CM | POA: Diagnosis not present

## 2023-08-07 DIAGNOSIS — R2689 Other abnormalities of gait and mobility: Secondary | ICD-10-CM | POA: Diagnosis not present

## 2023-08-07 DIAGNOSIS — R531 Weakness: Secondary | ICD-10-CM | POA: Diagnosis not present

## 2023-08-07 DIAGNOSIS — R278 Other lack of coordination: Secondary | ICD-10-CM | POA: Diagnosis not present

## 2023-08-07 DIAGNOSIS — M62562 Muscle wasting and atrophy, not elsewhere classified, left lower leg: Secondary | ICD-10-CM | POA: Diagnosis not present

## 2023-08-07 DIAGNOSIS — M62561 Muscle wasting and atrophy, not elsewhere classified, right lower leg: Secondary | ICD-10-CM | POA: Diagnosis not present

## 2023-08-08 DIAGNOSIS — R278 Other lack of coordination: Secondary | ICD-10-CM | POA: Diagnosis not present

## 2023-08-08 DIAGNOSIS — R531 Weakness: Secondary | ICD-10-CM | POA: Diagnosis not present

## 2023-08-08 DIAGNOSIS — Z9181 History of falling: Secondary | ICD-10-CM | POA: Diagnosis not present

## 2023-08-08 DIAGNOSIS — M62562 Muscle wasting and atrophy, not elsewhere classified, left lower leg: Secondary | ICD-10-CM | POA: Diagnosis not present

## 2023-08-08 DIAGNOSIS — M62561 Muscle wasting and atrophy, not elsewhere classified, right lower leg: Secondary | ICD-10-CM | POA: Diagnosis not present

## 2023-08-08 DIAGNOSIS — R2689 Other abnormalities of gait and mobility: Secondary | ICD-10-CM | POA: Diagnosis not present

## 2023-08-09 DIAGNOSIS — Z9181 History of falling: Secondary | ICD-10-CM | POA: Diagnosis not present

## 2023-08-09 DIAGNOSIS — M62561 Muscle wasting and atrophy, not elsewhere classified, right lower leg: Secondary | ICD-10-CM | POA: Diagnosis not present

## 2023-08-09 DIAGNOSIS — R531 Weakness: Secondary | ICD-10-CM | POA: Diagnosis not present

## 2023-08-09 DIAGNOSIS — R2689 Other abnormalities of gait and mobility: Secondary | ICD-10-CM | POA: Diagnosis not present

## 2023-08-09 DIAGNOSIS — R278 Other lack of coordination: Secondary | ICD-10-CM | POA: Diagnosis not present

## 2023-08-09 DIAGNOSIS — M62562 Muscle wasting and atrophy, not elsewhere classified, left lower leg: Secondary | ICD-10-CM | POA: Diagnosis not present

## 2023-08-10 DIAGNOSIS — Z9181 History of falling: Secondary | ICD-10-CM | POA: Diagnosis not present

## 2023-08-10 DIAGNOSIS — R2689 Other abnormalities of gait and mobility: Secondary | ICD-10-CM | POA: Diagnosis not present

## 2023-08-10 DIAGNOSIS — M62561 Muscle wasting and atrophy, not elsewhere classified, right lower leg: Secondary | ICD-10-CM | POA: Diagnosis not present

## 2023-08-10 DIAGNOSIS — R278 Other lack of coordination: Secondary | ICD-10-CM | POA: Diagnosis not present

## 2023-08-10 DIAGNOSIS — R531 Weakness: Secondary | ICD-10-CM | POA: Diagnosis not present

## 2023-08-10 DIAGNOSIS — M62562 Muscle wasting and atrophy, not elsewhere classified, left lower leg: Secondary | ICD-10-CM | POA: Diagnosis not present

## 2023-08-11 DIAGNOSIS — R2689 Other abnormalities of gait and mobility: Secondary | ICD-10-CM | POA: Diagnosis not present

## 2023-08-11 DIAGNOSIS — R531 Weakness: Secondary | ICD-10-CM | POA: Diagnosis not present

## 2023-08-11 DIAGNOSIS — M62561 Muscle wasting and atrophy, not elsewhere classified, right lower leg: Secondary | ICD-10-CM | POA: Diagnosis not present

## 2023-08-11 DIAGNOSIS — M62562 Muscle wasting and atrophy, not elsewhere classified, left lower leg: Secondary | ICD-10-CM | POA: Diagnosis not present

## 2023-08-11 DIAGNOSIS — R278 Other lack of coordination: Secondary | ICD-10-CM | POA: Diagnosis not present

## 2023-08-11 DIAGNOSIS — Z9181 History of falling: Secondary | ICD-10-CM | POA: Diagnosis not present

## 2023-08-13 DIAGNOSIS — M62562 Muscle wasting and atrophy, not elsewhere classified, left lower leg: Secondary | ICD-10-CM | POA: Diagnosis not present

## 2023-08-13 DIAGNOSIS — R2689 Other abnormalities of gait and mobility: Secondary | ICD-10-CM | POA: Diagnosis not present

## 2023-08-13 DIAGNOSIS — Z9181 History of falling: Secondary | ICD-10-CM | POA: Diagnosis not present

## 2023-08-13 DIAGNOSIS — M62561 Muscle wasting and atrophy, not elsewhere classified, right lower leg: Secondary | ICD-10-CM | POA: Diagnosis not present

## 2023-08-14 DIAGNOSIS — R531 Weakness: Secondary | ICD-10-CM | POA: Diagnosis not present

## 2023-08-14 DIAGNOSIS — R278 Other lack of coordination: Secondary | ICD-10-CM | POA: Diagnosis not present

## 2023-08-14 DIAGNOSIS — Z9181 History of falling: Secondary | ICD-10-CM | POA: Diagnosis not present

## 2023-08-15 ENCOUNTER — Non-Acute Institutional Stay (SKILLED_NURSING_FACILITY): Payer: Self-pay | Admitting: Orthopedic Surgery

## 2023-08-15 ENCOUNTER — Other Ambulatory Visit: Payer: Self-pay

## 2023-08-15 ENCOUNTER — Encounter: Payer: Self-pay | Admitting: Orthopedic Surgery

## 2023-08-15 ENCOUNTER — Other Ambulatory Visit (HOSPITAL_COMMUNITY): Payer: Self-pay

## 2023-08-15 ENCOUNTER — Encounter: Payer: Self-pay | Admitting: Cardiovascular Disease

## 2023-08-15 ENCOUNTER — Ambulatory Visit: Payer: Medicare PPO | Attending: Cardiovascular Disease | Admitting: Cardiovascular Disease

## 2023-08-15 VITALS — BP 112/76 | HR 65 | Ht 61.0 in | Wt 140.0 lb

## 2023-08-15 DIAGNOSIS — R278 Other lack of coordination: Secondary | ICD-10-CM | POA: Diagnosis not present

## 2023-08-15 DIAGNOSIS — I10 Essential (primary) hypertension: Secondary | ICD-10-CM

## 2023-08-15 DIAGNOSIS — F03911 Unspecified dementia, unspecified severity, with agitation: Secondary | ICD-10-CM

## 2023-08-15 DIAGNOSIS — I480 Paroxysmal atrial fibrillation: Secondary | ICD-10-CM | POA: Diagnosis not present

## 2023-08-15 DIAGNOSIS — E7800 Pure hypercholesterolemia, unspecified: Secondary | ICD-10-CM

## 2023-08-15 DIAGNOSIS — F329 Major depressive disorder, single episode, unspecified: Secondary | ICD-10-CM

## 2023-08-15 DIAGNOSIS — M8580 Other specified disorders of bone density and structure, unspecified site: Secondary | ICD-10-CM

## 2023-08-15 DIAGNOSIS — I4811 Longstanding persistent atrial fibrillation: Secondary | ICD-10-CM | POA: Diagnosis not present

## 2023-08-15 DIAGNOSIS — Z8673 Personal history of transient ischemic attack (TIA), and cerebral infarction without residual deficits: Secondary | ICD-10-CM

## 2023-08-15 DIAGNOSIS — R531 Weakness: Secondary | ICD-10-CM | POA: Diagnosis not present

## 2023-08-15 DIAGNOSIS — D6869 Other thrombophilia: Secondary | ICD-10-CM

## 2023-08-15 DIAGNOSIS — D329 Benign neoplasm of meninges, unspecified: Secondary | ICD-10-CM | POA: Diagnosis not present

## 2023-08-15 DIAGNOSIS — Z9181 History of falling: Secondary | ICD-10-CM | POA: Diagnosis not present

## 2023-08-15 DIAGNOSIS — R269 Unspecified abnormalities of gait and mobility: Secondary | ICD-10-CM | POA: Diagnosis not present

## 2023-08-15 DIAGNOSIS — I495 Sick sinus syndrome: Secondary | ICD-10-CM

## 2023-08-15 MED ORDER — APIXABAN 2.5 MG PO TABS: 2.5000 mg | ORAL_TABLET | Freq: Two times a day (BID) | ORAL | 7 refills | Status: DC

## 2023-08-15 MED ORDER — APIXABAN 2.5 MG PO TABS
2.5000 mg | ORAL_TABLET | Freq: Two times a day (BID) | ORAL | 7 refills | Status: DC
  Filled 2023-08-15: qty 90, 45d supply, fill #0

## 2023-08-15 NOTE — Progress Notes (Unsigned)
Cardiology Office Note:    Date:  08/16/2023   ID:  Jessica Mullins, DOB 09/06/1930, MRN [id number]  PCP:  Mahlon Gammon, MD  [facility] Cardiologist:  Thurmon Fair, MD  [facility] Electrophysiologist:  None   Referring MD: Mahlon Gammon, MD   Chief Complaint  Patient presents with   Follow-up    1 year.     History of Present Illness:    Jessica Mullins is a 89 y.o. female with a hx of paroxysmal atrial fibrillation without significant structural heart disease, more recently with persistent atrial fibrillation, remote history of vasovagal syncope, history of stroke 2022, cerebral meningioma.  She is accompanied today by caregiver and by her daughter.  She has had problems with additional cognitive decline and several falls and has now transitioned from assisted living to the skilled nursing facility [facility].  None of her falls have been associated with loss of consciousness or dizziness, they seem to be a sudden weakness in her lower extremities and she just crumbles down.  She did have 1 incident where she fell forward on her face and had a "shiner" but otherwise has not had any serious injuries or serious bleeding problems.  She is on long-term anticoagulation with Eliquis.  She denies problems with shortness of breath or chest pain and has not been aware of any palpitations.  She has not had significant lower extremity edema.  She has not had orthopnea, PND or any externalized bleeding.  She has not had any new focal neurological complaints.  ECG today shows atrial fibrillation with controlled ventricular response.  At her last appointment we discussed options for conservative management (continue anticoagulation, no need for rate control medications at this time) versus antiarrhythmic therapy with dofetilide (which requires hospitalization) or amiodarone (which has a much higher side effect profile and which would guarantee that she needs a pacemaker sooner.   Also discussed the fact that it is very likely that a pacemaker will be necessary at some point in the future due to evidence of AV node conduction disease (normal ventricular rates during atrial fibrillation without medications).   In November 2022 she developed problems with visual changes eventually with severe diplopia.  MRI of the brain showed a small punctate acute left parietal infarction, not an area that would be expected to contribute to visual changes.  CT angiogram of the head and neck did not show any large vessel stenoses and there was mild for age atherosclerosis in the intracranial vessels without significant stenoses.  The echocardiogram showed unchanged findings with normal LV systolic function LA dilated left atrium in the absence of hemodynamically significant valvular abnormalities (significant mitral annular calcification with caseous change reported).  Her symptoms have resolved.  She was seen in the emergency room for problems with her balance in early May.  She was in sinus bradycardia at 51 bpm.  On Sanii 20 she had dizziness and blurry vision with a heart rate reportedly of only 39 bpm.  She complains of fatigue and poor balance.  We stopped her diltiazem.  The event monitor showed persistent atrial fibrillation.  She was still taking flecainide and we set her up for cardioversion which was successfully performed on 01/11/2022.  She presents today in atrial fibrillation with controlled ventricular response again.  She remains arrhythmia unaware so she cannot tell us when the arrhythmia recurred.  She did not feel any improvement following her cardioversion.  Her previous event monitor October 2021 did show  a tendency to bradycardia with heart rates frequently in the 50s and borderline criteria for chronotropic incompetence.  Her average heart rate was 64.  The maximum achieved heart rate and sinus rhythm was 103 (75% of predicted for age).  Repeat event monitor performed in late  2023 showed persistent atrial fibrillation with periods of RVR, but no significant bradycardia.    Past Medical History:  Diagnosis Date   Allergy    Ankle fracture, left    Atrial fibrillation (HCC)    Per [facility] New Patient Packet    Breast cancer (HCC) 1115/16   left    Bursitis of right shoulder    Cataract    Family history of cancer    Fracture of right wrist    GERD (gastroesophageal reflux disease)    Hyperlipidemia    Hypertension    Lumbar radiculitis    Per [facility] New Patient Packet    Meningioma [facility])    Per [facility] New Patient Packet    Skin cancer 2000   melanoma and basal cell   Stress fracture    Right Heel, Per [facility] New Patient Packet    TIA (transient ischemic attack) 2022   Vaso vagal episode    during preparation for colonoscopy    Past Surgical History:  Procedure Laterality Date   ABDOMINAL HYSTERECTOMY  1988   TAH/BSO--FIBROIDS   APPENDECTOMY     BREAST LUMPECTOMY     B/L--[facility]   BREAST LUMPECTOMY WITH RADIOACTIVE SEED AND SENTINEL LYMPH NODE BIOPSY Left 06/11/2015   Procedure: LEFT BREAST LUMPECTOMY WITH RADIOACTIVE SEED AND LEFT SENTINEL LYMPH NODE MAPPING;  Surgeon: Harriette Bouillon, MD;  Location: [facility] OR;  Service: General;  Laterality: Left;   CARDIOVERSION N/A 01/11/2022   Procedure: CARDIOVERSION;  Surgeon: Jodelle Red, MD;  Location: [facility] ENDOSCOPY;  Service: Cardiovascular;  Laterality: N/A;   CATARACT EXTRACTION  2015   COLONOSCOPY  2010   DG  BONE DENSITY ([facility] HX)     EYE SURGERY Bilateral    cataracts   fiberadenoma Bilateral 1978, 1980   GANGLION CYST EXCISION     L  hand   Lipiflow procedure      Current Medications: Current Meds  Medication Sig   acetaminophen (TYLENOL) 500 MG tablet Take 1,000 mg by mouth in the morning.   acetaminophen (TYLENOL) 500 MG tablet Take 1,000 mg by mouth 2 (two) times daily as needed for moderate pain.   Calcium Carb-Cholecalciferol (CALCIUM 600+D3) 600-10 MG-MCG TABS Take 1 tablet by mouth in the  morning.   Cholecalciferol (VITAMIN D3) 1000 UNITS CAPS Take 1,000 Units by mouth in the morning.   cycloSPORINE (RESTASIS) 0.05 % ophthalmic emulsion Place 1 drop into both eyes 2 (two) times daily.  after applying a hot compress   denosumab (PROLIA) 60 MG/ML SOSY injection Inject 60 mg into the skin every 6 (six) months.   escitalopram (LEXAPRO) 5 MG tablet Take 20 mg by mouth in the morning.   hydrALAZINE (APRESOLINE) 10 MG tablet Take 10 mg by mouth every 8 (eight) hours as needed.   hydrOXYzine (ATARAX) 10 MG tablet Take 1 tablet (10 mg total) by mouth 2 (two) times daily as needed.   metoprolol succinate (TOPROL XL) 25 MG 24 hr tablet Take 25 mg every morning   mirtazapine (REMERON) 7.5 MG tablet Take 15 mg by mouth at bedtime.   polyethylene glycol (MIRALAX / GLYCOLAX) 17 g packet Take 17 g by mouth in the morning.   pravastatin (PRAVACHOL) 40 MG  tablet TAKE ONE TABLET BY MOUTH ONCE DAILY   senna (SENOKOT) 8.6 MG TABS tablet Take 2 tablets (17.2 mg total) by mouth at bedtime.   sodium chloride (MURO 128) 5 % ophthalmic ointment Place 1 Application into both eyes at bedtime.   vitamin B-12 (CYANOCOBALAMIN) 1000 MCG tablet Take 1 tablet (1,000 mcg total) by mouth daily.   [DISCONTINUED] apixaban (ELIQUIS) 2.5 MG TABS tablet Take 1 tablet (2.5 mg total) by mouth 2 (two) times daily.   [DISCONTINUED] ELIQUIS 5 MG TABS tablet TAKE ONE TABLET BY MOUTH TWICE DAILY     Allergies:   Sulfa antibiotics, Tape, Wound dressing adhesive, Glucosamine forte [nutritional supplements], and Latex   Social History   Socioeconomic History   Marital status: Widowed    Spouse name: Not on file   Number of children: 2   Years of education: Not on file   Highest education level: Master's degree (e.g., MA, MS, MEng, MEd, [name], [name])  Occupational History   Occupation: Magazine features editor: RETIRED    Comment: retired  Tobacco Use   Smoking status: Never   Smokeless tobacco: Never  Vaping Use   Vaping  status: Never Used  Substance and Sexual Activity   Alcohol use: Yes    Comment: rare   Drug use: No   Sexual activity: Never  Other Topics Concern   Not on file  Social History Narrative   Patient is widowed.  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    One son one daughter   1 caffeinated beverage daily   She is right-handed.    She works out everyday to a televised [employer] 30 minute program.   No tobacco or alcohol         Per [facility] New Patient Packet Abstracted on 01/03/2020      Diet: Left blank       Caffeine: Yes      Married, if yes what year: Widowed, married in 1955      Do you live in a house, apartment, assisted living, condo, trailer, ect: Apartment      Is it one or more stories: One stories, one person       Pets: 1 Medical laboratory scientific officer      Current/Past profession: Runner, broadcasting/film/video      Highest level or education completed: MA of Education       Exercise:   Yes             Type and how often: Cardio, chair aerobics, 3-4 times weekly          Living Will: Yes   DNR: Yes   POA/HPOA: Yes      Functional Status:   Do you have difficulty bathing or dressing yourself? Left blank   Do you have difficulty preparing food or eating? Left blank   Do you have difficulty managing your medications? Left blank   Do you have difficulty managing your finances? Left blank   Do you have difficulty affording your medications? Left blank   Social Drivers of Health   Financial Resource Strain: Low Risk  (03/07/2023)   Overall Financial Resource Strain (CARDIA)    Difficulty of Paying Living Expenses: Not hard at all  Food Insecurity: No Food Insecurity (03/07/2023)   Hunger Vital Sign    Worried About Running Out of Food in the Last Year: Never true    Ran Out of Food in the Last Year: Never true  Transportation Needs: No  Transportation Needs (03/07/2023)   PRAPARE - Administrator, Civil Service (Medical): No    Lack of Transportation (Non-Medical): No  Physical Activity:  Sufficiently Active (03/07/2023)   Exercise Vital Sign    Days of Exercise per Week: 5 days    Minutes of Exercise per Session: 30 min  Stress: No Stress Concern Present (03/07/2023)   [employer] of Occupational Health - Occupational Stress Questionnaire    Feeling of Stress : Only a little  Social Connections: Socially Isolated (03/07/2023)   Social Connection and Isolation Panel [NHANES]    Frequency of Communication with Friends and Family: More than three times a week    Frequency of Social Gatherings with Friends and Family: More than three times a week    Attends Religious Services: Never    Database administrator or Organizations: No    Attends Banker Meetings: Never    Marital Status: Widowed     Family History: The patient's family history includes Cancer in her mother; Coronary artery disease in her brother; Diabetes in her brother and maternal aunt; Heart disease in her brother; Heart disease (age of onset: 40) in her father; High Cholesterol in her son; Hyperlipidemia in her brother; Hypertension in her brother and son; Lung cancer in an other family member; Lung cancer (age of onset: 23) in her sister; Thrombosis in an other family member.  ROS:   Please see the history of present illness.     All other systems reviewed and are negative.  EKGs/Labs/Other Studies Reviewed:    The following studies were reviewed today: Event monitor 06/06/2022:   Dominant rhythm is atrial fibrillation with mostly well controlled ventricular response at rest, but with frequent mild rapid ventricular response during periods of activity.   No severe bradycardia is seen.   There are rare PVCs and a single 4 beat episode of nonsustained VT.   Abnormal arrhythmia monitor due to persistent atrial fibrillation, often with rapid ventricular response during activity, but well rate controlled at rest..  ECHO 05/11/2021   1. A small pericardial effusion is present. The pericardial  effusion is  anterior to the right ventricle. There is no RV or RA collapse suggestive  of increased pericardial pressures.   2. The inferior vena cava is normal in size with greater than 50%  respiratory variability, suggesting right atrial pressure of 3 mmHg.   3. Caseous mitral annular calcifification noted. The mitral valve is  abnormal. No evidence of mitral valve regurgitation. No evidence of mitral  stenosis.   4. Left ventricular ejection fraction, by estimation, is 55 to 60%. The  left ventricle has normal function. The left ventricle has no regional  wall motion abnormalities. There is mild concentric left ventricular  hypertrophy. Left ventricular diastolic  parameters are consistent with Grade II diastolic dysfunction  (pseudonormalization).   5. Right ventricular systolic function is normal. The right ventricular  size is normal. There is normal pulmonary artery systolic pressure.   6. Left atrial size was mildly dilated.   7. The aortic valve was not well visualized. Aortic valve regurgitation  is not visualized. No aortic stenosis is present.   Comparison(s): Prior images unable to be directly viewed, comparison made  by report only. Similar from prior report.   EKG:    EKG Interpretation Date/Time:  Tuesday August 15 2023 13:22:17 EST Ventricular Rate:  65 PR Interval:    QRS Duration:  82 QT Interval:  380 QTC Calculation: 395  R Axis:   -27  Text Interpretation: Atrial fibrillation Septal infarct , age undetermined When compared with ECG of 30-Jan-2023 21:14, No significant change since last tracing Confirmed by Graycen Sadlon [phone]) on 08/15/2023 1:26:54 PM         Recent Labs: 12/04/2022: Magnesium 2.5 12/12/2022: TSH 3.099 02/23/2023: ALT 15; Hemoglobin 15.4; Platelets 208 03/23/2023: BUN 18; Creatinine 0.8; Potassium 4.7; Sodium 144  Recent Lipid Panel    Component Value Date/Time   CHOL 152 08/04/2022 0000   TRIG 91 08/04/2022 0000   HDL 63  08/04/2022 0000   CHOLHDL 2.5 05/11/2021 0128   VLDL 17 05/11/2021 0128   LDLCALC 70 08/04/2022 0000   LDLDIRECT 153.4 11/16/2009 0000    Physical Exam:    VS:  BP 112/76 (BP Location: Right Arm, Patient Position: Sitting, Cuff Size: Normal)   Pulse 65   Ht 5\' 1"  (1.549 m)   Wt 140 lb (63.5 kg)   BMI 26.45 kg/m     Wt Readings from Last 3 Encounters:  08/15/23 137 lb 3.2 oz (62.2 kg)  08/15/23 140 lb (63.5 kg)  07/21/23 134 lb 12.8 oz (61.1 kg)       General: Alert, oriented x3, no distress, alert, appears mildly disoriented, but aware that she is in the cardiology office. Head: no evidence of trauma, PERRL, EOMI, no exophtalmos or lid lag, no myxedema, no xanthelasma; normal ears, nose and oropharynx Neck: normal jugular venous pulsations and no hepatojugular reflux; brisk carotid pulses without delay and no carotid bruits Chest: clear to auscultation, no signs of consolidation by percussion or palpation, normal fremitus, symmetrical and full respiratory excursions Cardiovascular: normal position and quality of the apical impulse, irregular rhythm, normal first and second heart sounds, no murmurs, rubs or gallops Abdomen: no tenderness or distention, no masses by palpation, no abnormal pulsatility or arterial bruits, normal bowel sounds, no hepatosplenomegaly Extremities: no clubbing, cyanosis or edema; 2+ radial, ulnar and brachial pulses bilaterally; 2+ right femoral, posterior tibial and dorsalis pedis pulses; 2+ left femoral, posterior tibial and dorsalis pedis pulses; no subclavian or femoral bruits Neurological: grossly nonfocal.  Does ask the same question more than once suggesting short-term memory problems. Psych: Normal mood and affect    ASSESSMENT:    1. Longstanding persistent atrial fibrillation (HCC)   2. SSS (sick sinus syndrome) (HCC)   3. Acquired thrombophilia (HCC)   4. Essential hypertension   5. History of ischemic stroke   6. Hypercholesterolemia        PLAN:    In order of problems listed above:  AFib: Asymptomatic, well rate controlled, no significant underlying structural heart disease and preserved left ventricular systolic function.  Embolic risk is high.  CHA2DS2-VASc score is at least 6 (age 35, gender, HTN, history of CVA).  Obviously her risk of bleeding and injury has increased with her decline in physical and cognitive capabilities.  I still think the benefit of Eliquis outweighs his risk and we will continue it for the time being.  However in view of her advanced age and relatively small body habitus (very close to our cutoff of 60 kg, recommend reducing the Eliquis dose to 2.5 mg twice daily. SSS/AV conduction abnormalities: When she was in sinus rhythm we had to stop all AV nodal blocking agents due to bradycardia.  Currently requiring a very low-dose of metoprolol to achieve good rate control.  There was no evidence of significant bradycardia on her most recent rhythm monitor.  Her episodes of fall  seem to be related to poor balance, not true syncope. Anticoagulation: Decrease Eliquis to 2.5 mg twice daily. HTN: She has a tendency to have orthostatic hypotension but this is not evident today.  Blood pressure is normal.  Consider stopping her hydralazine altogether. History of CVA: This was discovered incidentally on CT.  Not in a typical location for embolic stroke. HLP: All the lipid parameters are in target range.  Continue pravastatin  Medication Adjustments/Labs and Tests Ordered: Current medicines are reviewed at length with the patient today.  Concerns regarding medicines are outlined above.  Orders Placed This Encounter  Procedures   EKG 12-Lead    Meds ordered this encounter  Medications   DISCONTD: apixaban (ELIQUIS) 2.5 MG TABS tablet    Sig: Take 1 tablet (2.5 mg total) by mouth 2 (two) times daily.    Dispense:  90 tablet    Refill:  7   apixaban (ELIQUIS) 2.5 MG TABS tablet    Sig: Take 1 tablet (2.5 mg  total) by mouth 2 (two) times daily.    Dispense:  90 tablet    Refill:  7     Patient Instructions  Medication Instructions:  - START ELIQUIS 2.5MG  TWICE DAILY    *If you need a refill on your cardiac medications before your next appointment, please call your pharmacy*   Lab Work: NONE    If you have labs (blood work) drawn today and your tests are completely normal, you will receive your results only by: MyChart Message (if you have MyChart) OR A paper copy in the mail If you have any lab test that is abnormal or we need to change your treatment, we will call you to review the results.   Testing/Procedures: NONE    Follow-Up: At [facility], you and your health needs are our priority.  As part of our continuing mission to provide you with exceptional heart care, we have created designated Provider Care Teams.  These Care Teams include your primary Cardiologist (physician) and Advanced Practice Providers (APPs -  Physician Assistants and Nurse Practitioners) who all work together to provide you with the care you need, when you need it.  We recommend signing up for the patient portal called "MyChart".  Sign up information is provided on this After Visit Summary.  MyChart is used to connect with patients for Virtual Visits (Telemedicine).  Patients are able to view lab/test results, encounter notes, upcoming appointments, etc.  Non-urgent messages can be sent to your provider as well.   To learn more about what you can do with MyChart, go to [email or web address].    Your next appointment:   6 month(s)  The format for your next appointment:   In Person  Provider:   Thurmon Fair, MD    Other Instructions    Signed, Thurmon Fair, MD  08/16/2023 3:09 PM    [facility]

## 2023-08-15 NOTE — Progress Notes (Signed)
Location:  [facility] Room Number: 127/A Place of Service:  SNF [phone]) Provider:  Octavia Heir, NP   Mahlon Gammon, MD  Patient Care Team: Mahlon Gammon, MD as PCP - General (Internal Medicine) Thurmon Fair, MD as PCP - Cardiology (Cardiology) Nita Sells, MD as Consulting Physician (Dermatology) Nelson Chimes, MD as Consulting Physician (Ophthalmology) Drema Dallas, DO as Consulting Physician (Neurology) Axel Filler Larna Daughters, NP as Nurse Practitioner (Hematology and Oncology)  Extended Emergency Contact Information Primary Emergency Contact: Ray,Ruth  [place] of [place] Home Phone: [phone] Mobile Phone: [phone] Relation: Daughter Secondary Emergency Contact: Corliss Marcus [place] of [place] Home Phone: [phone] Relation: Son  Code Status:  DNR Goals of care: Advanced Directive information    06/26/2023   11:16 AM  Advanced Directives  Does Patient Have a Medical Advance Directive? Yes  Type of Estate agent of [place];Living will;Out of facility DNR (pink MOST or yellow form)  Does patient want to make changes to medical advance directive? No - Patient declined  Copy of Healthcare Power of Attorney in Chart? Yes - validated most recent copy scanned in chart (See row information)     Chief Complaint  Patient presents with   Medical Management of Chronic Issues    HPI:  Pt is a 89 y.o. female seen today for medical management of chronic diseases.    She currently resides on the skilled nursing unit (admitted 02/23/2023) at [facility] due to weakness. PMH: PAF, HTN, HLD, left breast cancer, meningioma, osteopenia, and gait abnormality.    HTN- BUN/creat 18/0.8 03/23/2023, remains on low dose metoprolol and  hydralazine for SBP> 175 HLD- total 152/ LDL 70 08/04/2022, remains on pravastatin PAF-  h/o cardioversion, off Cardizem due to worsening bradycardia, HR< 100 with  metoprolol, remains on low dose Eliquis for clot prevention  Dementia/abnormal gait- 08/29 CT head noted periventricular and deep white matter hypodensity, MMSE 19/30 02/27/2023, intermittent agitation, poor safety awareness and worsening gait, remains on vistaril Meningioma-  began as leg paresthesias last year, 08/29 CT head noted 3.1 x 2.7 cm meningioma> increased in size over years, no surgical intervention per neurosurgery/neurology Reactive depression- Na+ 144 03/23/2023, remains on Lexapro and Remeron Senile osteopenia- DEXA 06/2021, t score -1.1, remains on Prolia injections> scheduled 09/14/2023   Past Medical History:  Diagnosis Date   Allergy    Ankle fracture, left    Atrial fibrillation (HCC)    Per [facility] New Patient Packet    Breast cancer (HCC) 1115/16   left    Bursitis of right shoulder    Cataract    Family history of cancer    Fracture of right wrist    GERD (gastroesophageal reflux disease)    Hyperlipidemia    Hypertension    Lumbar radiculitis    Per [facility] New Patient Packet    Meningioma [facility])    Per [facility] New Patient Packet    Skin cancer 2000   melanoma and basal cell   Stress fracture    Right Heel, Per [facility] New Patient Packet    TIA (transient ischemic attack) 2022   Vaso vagal episode    during preparation for colonoscopy   Past Surgical History:  Procedure Laterality Date   ABDOMINAL HYSTERECTOMY  1988   TAH/BSO--FIBROIDS   APPENDECTOMY     BREAST LUMPECTOMY     B/L--[facility]   BREAST LUMPECTOMY WITH RADIOACTIVE SEED AND SENTINEL LYMPH NODE BIOPSY Left 06/11/2015   Procedure: LEFT BREAST  LUMPECTOMY WITH RADIOACTIVE SEED AND LEFT SENTINEL LYMPH NODE MAPPING;  Surgeon: Harriette Bouillon, MD;  Location: [facility] OR;  Service: General;  Laterality: Left;   CARDIOVERSION N/A 01/11/2022   Procedure: CARDIOVERSION;  Surgeon: Jodelle Red, MD;  Location: [facility] ENDOSCOPY;  Service: Cardiovascular;  Laterality: N/A;   CATARACT EXTRACTION  2015   COLONOSCOPY  2010    DG  BONE DENSITY ([facility] HX)     EYE SURGERY Bilateral    cataracts   fiberadenoma Bilateral 1978, 1980   GANGLION CYST EXCISION     L  hand   Lipiflow procedure      Allergies  Allergen Reactions   Sulfa Antibiotics Other (See Comments)    As a child, became "rigid as a stick and not responsive"   Tape Other (See Comments)    Blisters, Please use "paper" tape only for short periods of time   Wound Dressing Adhesive Other (See Comments)    Blisters, Please use "paper" tape only for short periods of time, Blisters, Please use "paper" tape only for short periods of time   Glucosamine Forte [Nutritional Supplements] Rash   Latex Rash    Outpatient Encounter Medications as of 08/15/2023  Medication Sig   acetaminophen (TYLENOL) 500 MG tablet Take 1,000 mg by mouth in the morning.   acetaminophen (TYLENOL) 500 MG tablet Take 1,000 mg by mouth 2 (two) times daily as needed for moderate pain.   Calcium Carb-Cholecalciferol (CALCIUM 600+D3) 600-10 MG-MCG TABS Take 1 tablet by mouth in the morning.   Cholecalciferol (VITAMIN D3) 1000 UNITS CAPS Take 1,000 Units by mouth in the morning.   cycloSPORINE (RESTASIS) 0.05 % ophthalmic emulsion Place 1 drop into both eyes 2 (two) times daily.  after applying a hot compress   denosumab (PROLIA) 60 MG/ML SOSY injection Inject 60 mg into the skin every 6 (six) months.   escitalopram (LEXAPRO) 5 MG tablet Take 20 mg by mouth in the morning.   hydrALAZINE (APRESOLINE) 10 MG tablet Take 10 mg by mouth every 8 (eight) hours as needed.   hydrOXYzine (ATARAX) 10 MG tablet Take 1 tablet (10 mg total) by mouth 2 (two) times daily as needed.   metoprolol succinate (TOPROL XL) 25 MG 24 hr tablet Take 25 mg every morning   mirtazapine (REMERON) 7.5 MG tablet Take 15 mg by mouth at bedtime.   polyethylene glycol (MIRALAX / GLYCOLAX) 17 g packet Take 17 g by mouth in the morning.   pravastatin (PRAVACHOL) 40 MG tablet TAKE ONE TABLET BY MOUTH ONCE DAILY   senna  (SENOKOT) 8.6 MG TABS tablet Take 2 tablets (17.2 mg total) by mouth at bedtime.   sodium chloride (MURO 128) 5 % ophthalmic ointment Place 1 Application into both eyes at bedtime.   vitamin B-12 (CYANOCOBALAMIN) 1000 MCG tablet Take 1 tablet (1,000 mcg total) by mouth daily.   [DISCONTINUED] apixaban (ELIQUIS) 2.5 MG TABS tablet Take 1 tablet (2.5 mg total) by mouth 2 (two) times daily.   No facility-administered encounter medications on file as of 08/15/2023.    Review of Systems  Unable to perform ROS: Dementia    Immunization History  Administered Date(s) Administered   Fluad Quad(high Dose 65+) 02/27/2020, 03/29/2022   H1N1 07/01/2008   Influenza Whole 04/10/2007, 04/01/2009, 03/26/2010, 03/25/2012   Influenza, High Dose Seasonal PF 04/16/2014, 04/09/2015, 04/03/2017, 04/19/2018, 02/28/2019, 03/08/2021, 04/18/2023   Influenza-Unspecified 06/27/2016, 04/03/2017   Moderna Sars-Covid-2 Vaccination 04/27/2022   PFIZER Comirnaty(Gray Top)Covid-19 Tri-Sucrose Vaccine 01/05/2021   PFIZER(Purple Top)SARS-COV-2 Vaccination  07/18/2019, 08/08/2019, 03/29/2020   Pneumococcal Conjugate-13 05/13/2014   Pneumococcal Polysaccharide-23 08/29/2016   Td 11/05/2003   Tdap 06/28/2011, 09/19/2011, 10/23/2020   Tetanus 10/23/2020   Unspecified SARS-COV-2 Vaccination 04/18/2023   Zoster Recombinant(Shingrix) 10/08/2016, 01/03/2017   Zoster, Live 02/24/2010   Pertinent  Health Maintenance Due  Topic Date Due   MAMMOGRAM  07/20/2024 (Originally 07/12/2023)   INFLUENZA VACCINE  Completed   DEXA SCAN  Completed      02/20/2023   12:50 PM 03/07/2023    2:38 PM 04/25/2023    1:52 PM 05/23/2023    3:02 PM 06/19/2023    4:14 PM  Fall Risk  Falls in the past year? 1 1 1 1  0  Was there an injury with Fall? 0 1 1 1  0  Fall Risk Category Calculator 1 3 3 3  0  Patient at Risk for Falls Due to History of fall(s);Impaired mobility History of fall(s);Impaired balance/gait;Impaired mobility History of  fall(s);Impaired balance/gait;Impaired mobility History of fall(s);Impaired balance/gait;Impaired mobility No Fall Risks  Fall risk Follow up Falls evaluation completed Falls evaluation completed;Education provided;Falls prevention discussed Falls evaluation completed;Education provided;Falls prevention discussed Falls evaluation completed;Education provided;Falls prevention discussed Falls evaluation completed   Functional Status Survey:    Vitals:   08/15/23 1343  BP: 111/70  Pulse: 75  Resp: 17  Temp: (!) 97 F (36.1 C)  SpO2: 96%  Weight: 137 lb 3.2 oz (62.2 kg)  Height: 4\' 8"  (1.422 m)   Body mass index is 30.76 kg/m. Physical Exam Vitals reviewed.  Constitutional:      General: She is not in acute distress. HENT:     Head: Normocephalic.     Right Ear: There is no impacted cerumen.     Left Ear: There is no impacted cerumen.     Nose: Nose normal.     Mouth/Throat:     Mouth: Mucous membranes are moist.  Eyes:     General:        Right eye: No discharge.        Left eye: No discharge.  Cardiovascular:     Rate and Rhythm: Normal rate. Rhythm irregular.     Pulses: Normal pulses.     Heart sounds: Normal heart sounds.  Pulmonary:     Effort: Pulmonary effort is normal. No respiratory distress.     Breath sounds: Normal breath sounds. No wheezing or rales.  Abdominal:     General: Bowel sounds are normal.     Palpations: Abdomen is soft.  Musculoskeletal:     Cervical back: Neck supple.     Right lower leg: No edema.     Left lower leg: No edema.  Skin:    General: Skin is warm.     Capillary Refill: Capillary refill takes less than 2 seconds.  Neurological:     General: No focal deficit present.     Mental Status: She is alert. Mental status is at baseline.     Motor: Weakness present.     Gait: Gait abnormal.     Comments: Walker/wheelchair  Psychiatric:     Comments: Pleasant, alert to self/person, follows commands, aphasia     Labs  reviewed: Recent Labs    12/04/22 1906 12/11/22 1937 01/30/23 2123 01/30/23 2133 02/23/23 0959 03/23/23 0000  NA 141   < > 137 138 140 144  K 4.1   < > 4.0 3.8 3.7 4.7  CL 106   < > 102 103 101 106  CO2 26   < >  26  --  31 26*  GLUCOSE 96   < > 125* 123* 76  --   BUN 28*   < > 31* 32* 15 18  CREATININE 1.06*   < > 1.03* 1.00 0.94 0.8  CALCIUM 9.6   < > 10.0  --  9.8 9.8  MG 2.5*  --   --   --   --   --    < > = values in this interval not displayed.   Recent Labs    12/11/22 1937 01/30/23 2123 02/23/23 0959  AST 22 28 24   ALT 18 18 15   ALKPHOS 64 55 50  BILITOT 0.9 1.2 1.3*  PROT 7.1 5.7* 5.8*  ALBUMIN 4.2 3.5 3.5   Recent Labs    12/04/22 1906 12/11/22 1937 12/12/22 0859 12/13/22 0411 01/30/23 2123 01/30/23 2133 02/23/23 0959  WBC 8.5 9.1   < > 8.4 8.8  --  8.6  NEUTROABS 5.8 5.9  --   --   --   --  6.5  HGB 15.3* 17.6*   < > 16.5* 14.9 15.3* 15.4*  HCT 45.3 51.4*   < > 49.0* 43.8 45.0 45.7  MCV 92.1 91.8   < > 90.1 90.7  --  90.9  PLT 191 217   < > 208 208  --  208   < > = values in this interval not displayed.   Lab Results  Component Value Date   TSH 3.099 12/12/2022   Lab Results  Component Value Date   HGBA1C 4.8 05/11/2021   Lab Results  Component Value Date   CHOL 152 08/04/2022   HDL 63 08/04/2022   LDLCALC 70 08/04/2022   LDLDIRECT 153.4 11/16/2009   TRIG 91 08/04/2022   CHOLHDL 2.5 05/11/2021    Significant Diagnostic Results in last 30 days:  No results found.  Assessment/Plan 1. Essential hypertension (Primary) - controlled - cont low dose metoprolol - cont hydralazine prn  2. Pure hypercholesterolemia - cont pravastatin  3. Paroxysmal atrial fibrillation (HCC) - followed by cardiology> seen 02/18 - continue low dose metoprolol - cont low dose Eliquis  4. Agitation due to dementia (HCC) - no recent agitation - dependent with some ADLs - ambulates short distances with walker - weight stable - cont Vistaril prn  5.  Gait abnormality - ongoing - cont falls safety precautions  6. Meningioma (HCC) - followed by neurology - no further workup at this time  7. Reactive depression - improved mood - cont Lexapro and Remeron  8. Senile osteopenia - DEXA 06/2021, t score -1.1 - next Prolia due 08/2023    Family/ staff Communication: plan discussed with patient and nurse  Labs/tests ordered:  cbc/diff, cmp, TSH, lipid panel 08/24/2023

## 2023-08-15 NOTE — Patient Instructions (Signed)
Medication Instructions:  - START ELIQUIS 2.5MG  TWICE DAILY    *If you need a refill on your cardiac medications before your next appointment, please call your pharmacy*   Lab Work: NONE    If you have labs (blood work) drawn today and your tests are completely normal, you will receive your results only by: MyChart Message (if you have MyChart) OR A paper copy in the mail If you have any lab test that is abnormal or we need to change your treatment, we will call you to review the results.   Testing/Procedures: NONE    Follow-Up: At [facility], you and your health needs are our priority.  As part of our continuing mission to provide you with exceptional heart care, we have created designated Provider Care Teams.  These Care Teams include your primary Cardiologist (physician) and Advanced Practice Providers (APPs -  Physician Assistants and Nurse Practitioners) who all work together to provide you with the care you need, when you need it.  We recommend signing up for the patient portal called "MyChart".  Sign up information is provided on this After Visit Summary.  MyChart is used to connect with patients for Virtual Visits (Telemedicine).  Patients are able to view lab/test results, encounter notes, upcoming appointments, etc.  Non-urgent messages can be sent to your provider as well.   To learn more about what you can do with MyChart, go to [email or web address].    Your next appointment:   6 month(s)  The format for your next appointment:   In Person  Provider:   Thurmon Fair, MD    Other Instructions

## 2023-08-16 DIAGNOSIS — M62561 Muscle wasting and atrophy, not elsewhere classified, right lower leg: Secondary | ICD-10-CM | POA: Diagnosis not present

## 2023-08-16 DIAGNOSIS — Z9181 History of falling: Secondary | ICD-10-CM | POA: Diagnosis not present

## 2023-08-16 DIAGNOSIS — M62562 Muscle wasting and atrophy, not elsewhere classified, left lower leg: Secondary | ICD-10-CM | POA: Diagnosis not present

## 2023-08-16 DIAGNOSIS — R2689 Other abnormalities of gait and mobility: Secondary | ICD-10-CM | POA: Diagnosis not present

## 2023-08-17 DIAGNOSIS — R531 Weakness: Secondary | ICD-10-CM | POA: Diagnosis not present

## 2023-08-17 DIAGNOSIS — R278 Other lack of coordination: Secondary | ICD-10-CM | POA: Diagnosis not present

## 2023-08-17 DIAGNOSIS — Z9181 History of falling: Secondary | ICD-10-CM | POA: Diagnosis not present

## 2023-08-18 DIAGNOSIS — R531 Weakness: Secondary | ICD-10-CM | POA: Diagnosis not present

## 2023-08-18 DIAGNOSIS — R278 Other lack of coordination: Secondary | ICD-10-CM | POA: Diagnosis not present

## 2023-08-18 DIAGNOSIS — Z9181 History of falling: Secondary | ICD-10-CM | POA: Diagnosis not present

## 2023-08-19 DIAGNOSIS — R531 Weakness: Secondary | ICD-10-CM | POA: Diagnosis not present

## 2023-08-19 DIAGNOSIS — Z9181 History of falling: Secondary | ICD-10-CM | POA: Diagnosis not present

## 2023-08-19 DIAGNOSIS — R278 Other lack of coordination: Secondary | ICD-10-CM | POA: Diagnosis not present

## 2023-08-21 DIAGNOSIS — Z9181 History of falling: Secondary | ICD-10-CM | POA: Diagnosis not present

## 2023-08-21 DIAGNOSIS — R278 Other lack of coordination: Secondary | ICD-10-CM | POA: Diagnosis not present

## 2023-08-21 DIAGNOSIS — R531 Weakness: Secondary | ICD-10-CM | POA: Diagnosis not present

## 2023-08-22 ENCOUNTER — Telehealth: Payer: Self-pay | Admitting: *Deleted

## 2023-08-22 ENCOUNTER — Other Ambulatory Visit: Payer: Self-pay | Admitting: *Deleted

## 2023-08-22 DIAGNOSIS — Z9181 History of falling: Secondary | ICD-10-CM | POA: Diagnosis not present

## 2023-08-22 DIAGNOSIS — R531 Weakness: Secondary | ICD-10-CM | POA: Diagnosis not present

## 2023-08-22 DIAGNOSIS — M810 Age-related osteoporosis without current pathological fracture: Secondary | ICD-10-CM

## 2023-08-22 DIAGNOSIS — R278 Other lack of coordination: Secondary | ICD-10-CM | POA: Diagnosis not present

## 2023-08-22 MED ORDER — DENOSUMAB 60 MG/ML ~~LOC~~ SOSY
60.0000 mg | PREFILLED_SYRINGE | Freq: Once | SUBCUTANEOUS | Status: AC
Start: 2023-08-22 — End: 2023-09-14
  Administered 2023-09-14: 60 mg via SUBCUTANEOUS

## 2023-08-22 NOTE — Telephone Encounter (Signed)
 Patient is scheduled for a Prolia Injection on 09/14/2023 and needs updated labs.  Please place orders for patient to get Labs done at [facility] and place on Lab schedule.   Please Advise.

## 2023-08-22 NOTE — Telephone Encounter (Signed)
 Thank you. Will [facility] notify her of the Lab Appointment?

## 2023-08-22 NOTE — Telephone Encounter (Signed)
 She is SNF They will do the Labs they are going to be in [place] this week

## 2023-08-22 NOTE — Telephone Encounter (Signed)
 I have placed the order

## 2023-08-23 DIAGNOSIS — Z9181 History of falling: Secondary | ICD-10-CM | POA: Diagnosis not present

## 2023-08-23 DIAGNOSIS — R278 Other lack of coordination: Secondary | ICD-10-CM | POA: Diagnosis not present

## 2023-08-23 DIAGNOSIS — R531 Weakness: Secondary | ICD-10-CM | POA: Diagnosis not present

## 2023-08-24 DIAGNOSIS — R278 Other lack of coordination: Secondary | ICD-10-CM | POA: Diagnosis not present

## 2023-08-24 DIAGNOSIS — Z9181 History of falling: Secondary | ICD-10-CM | POA: Diagnosis not present

## 2023-08-24 DIAGNOSIS — I10 Essential (primary) hypertension: Secondary | ICD-10-CM | POA: Diagnosis not present

## 2023-08-24 DIAGNOSIS — R531 Weakness: Secondary | ICD-10-CM | POA: Diagnosis not present

## 2023-08-24 LAB — CBC: RBC: 4.33 (ref 3.87–5.11)

## 2023-08-24 LAB — CBC AND DIFFERENTIAL
HCT: 40 (ref 36–46)
Hemoglobin: 13.7 (ref 12.0–16.0)
Platelets: 180 10*3/uL (ref 150–400)
WBC: 6.6

## 2023-08-24 LAB — LIPID PANEL
Cholesterol: 122 (ref 0–200)
HDL: 58 (ref 35–70)
LDL Cholesterol: 53
Triglycerides: 57 (ref 40–160)

## 2023-08-24 LAB — BASIC METABOLIC PANEL WITH GFR
BUN: 26 — AB (ref 4–21)
CO2: 24 — AB (ref 13–22)
Chloride: 108 (ref 99–108)
Creatinine: 0.7 (ref 0.5–1.1)
Glucose: 90
Potassium: 4.1 meq/L (ref 3.5–5.1)
Sodium: 142 (ref 137–147)

## 2023-08-24 LAB — COMPREHENSIVE METABOLIC PANEL WITH GFR
Albumin: 3.6 (ref 3.5–5.0)
Calcium: 9.3 (ref 8.7–10.7)
eGFR: 78

## 2023-08-24 LAB — HEPATIC FUNCTION PANEL
ALT: 20 U/L (ref 7–35)
AST: 24 (ref 13–35)
Alkaline Phosphatase: 98 (ref 25–125)

## 2023-08-24 LAB — TSH: TSH: 2.16 (ref 0.41–5.90)

## 2023-08-25 DIAGNOSIS — R531 Weakness: Secondary | ICD-10-CM | POA: Diagnosis not present

## 2023-08-25 DIAGNOSIS — Z9181 History of falling: Secondary | ICD-10-CM | POA: Diagnosis not present

## 2023-08-25 DIAGNOSIS — R278 Other lack of coordination: Secondary | ICD-10-CM | POA: Diagnosis not present

## 2023-08-28 DIAGNOSIS — R278 Other lack of coordination: Secondary | ICD-10-CM | POA: Diagnosis not present

## 2023-08-28 DIAGNOSIS — R531 Weakness: Secondary | ICD-10-CM | POA: Diagnosis not present

## 2023-08-28 DIAGNOSIS — Z9181 History of falling: Secondary | ICD-10-CM | POA: Diagnosis not present

## 2023-08-30 DIAGNOSIS — R531 Weakness: Secondary | ICD-10-CM | POA: Diagnosis not present

## 2023-08-30 DIAGNOSIS — Z9181 History of falling: Secondary | ICD-10-CM | POA: Diagnosis not present

## 2023-08-30 DIAGNOSIS — R278 Other lack of coordination: Secondary | ICD-10-CM | POA: Diagnosis not present

## 2023-08-31 NOTE — Telephone Encounter (Signed)
 Have you seen patient's labwork?  I am unable to log into [facility].

## 2023-08-31 NOTE — Telephone Encounter (Signed)
 They send me the pic and everything is good I have told them to send the copies with her when she comes

## 2023-09-01 DIAGNOSIS — R278 Other lack of coordination: Secondary | ICD-10-CM | POA: Diagnosis not present

## 2023-09-01 DIAGNOSIS — R531 Weakness: Secondary | ICD-10-CM | POA: Diagnosis not present

## 2023-09-01 DIAGNOSIS — Z9181 History of falling: Secondary | ICD-10-CM | POA: Diagnosis not present

## 2023-09-02 DIAGNOSIS — R531 Weakness: Secondary | ICD-10-CM | POA: Diagnosis not present

## 2023-09-02 DIAGNOSIS — R278 Other lack of coordination: Secondary | ICD-10-CM | POA: Diagnosis not present

## 2023-09-02 DIAGNOSIS — Z9181 History of falling: Secondary | ICD-10-CM | POA: Diagnosis not present

## 2023-09-05 DIAGNOSIS — Z9181 History of falling: Secondary | ICD-10-CM | POA: Diagnosis not present

## 2023-09-05 DIAGNOSIS — R278 Other lack of coordination: Secondary | ICD-10-CM | POA: Diagnosis not present

## 2023-09-05 DIAGNOSIS — R531 Weakness: Secondary | ICD-10-CM | POA: Diagnosis not present

## 2023-09-07 DIAGNOSIS — Z9181 History of falling: Secondary | ICD-10-CM | POA: Diagnosis not present

## 2023-09-07 DIAGNOSIS — R278 Other lack of coordination: Secondary | ICD-10-CM | POA: Diagnosis not present

## 2023-09-07 DIAGNOSIS — R531 Weakness: Secondary | ICD-10-CM | POA: Diagnosis not present

## 2023-09-09 ENCOUNTER — Other Ambulatory Visit: Payer: Self-pay

## 2023-09-09 ENCOUNTER — Emergency Department (HOSPITAL_COMMUNITY)

## 2023-09-09 ENCOUNTER — Emergency Department (HOSPITAL_COMMUNITY): Admission: EM | Admit: 2023-09-09 | Discharge: 2023-09-09 | Disposition: A | Discharge status: Home / Self Care

## 2023-09-09 DIAGNOSIS — M5489 Other dorsalgia: Secondary | ICD-10-CM | POA: Diagnosis not present

## 2023-09-09 DIAGNOSIS — Z743 Need for continuous supervision: Secondary | ICD-10-CM | POA: Diagnosis not present

## 2023-09-09 DIAGNOSIS — W19XXXA Unspecified fall, initial encounter: Secondary | ICD-10-CM | POA: Insufficient documentation

## 2023-09-09 DIAGNOSIS — M545 Low back pain, unspecified: Secondary | ICD-10-CM | POA: Insufficient documentation

## 2023-09-09 DIAGNOSIS — I10 Essential (primary) hypertension: Secondary | ICD-10-CM | POA: Diagnosis not present

## 2023-09-09 DIAGNOSIS — M4856XA Collapsed vertebra, not elsewhere classified, lumbar region, initial encounter for fracture: Secondary | ICD-10-CM | POA: Diagnosis not present

## 2023-09-09 DIAGNOSIS — Z7901 Long term (current) use of anticoagulants: Secondary | ICD-10-CM | POA: Insufficient documentation

## 2023-09-09 DIAGNOSIS — M4316 Spondylolisthesis, lumbar region: Secondary | ICD-10-CM | POA: Diagnosis not present

## 2023-09-09 DIAGNOSIS — I700 Atherosclerosis of aorta: Secondary | ICD-10-CM | POA: Diagnosis not present

## 2023-09-09 DIAGNOSIS — Y92129 Unspecified place in nursing home as the place of occurrence of the external cause: Secondary | ICD-10-CM | POA: Insufficient documentation

## 2023-09-09 DIAGNOSIS — S3992XA Unspecified injury of lower back, initial encounter: Secondary | ICD-10-CM | POA: Diagnosis not present

## 2023-09-09 DIAGNOSIS — Z91040 Latex allergy status: Secondary | ICD-10-CM | POA: Diagnosis not present

## 2023-09-09 DIAGNOSIS — D320 Benign neoplasm of cerebral meninges: Secondary | ICD-10-CM | POA: Diagnosis not present

## 2023-09-09 DIAGNOSIS — S199XXA Unspecified injury of neck, initial encounter: Secondary | ICD-10-CM | POA: Diagnosis not present

## 2023-09-09 DIAGNOSIS — F0390 Unspecified dementia without behavioral disturbance: Secondary | ICD-10-CM | POA: Insufficient documentation

## 2023-09-09 DIAGNOSIS — S299XXA Unspecified injury of thorax, initial encounter: Secondary | ICD-10-CM | POA: Diagnosis not present

## 2023-09-09 DIAGNOSIS — R531 Weakness: Secondary | ICD-10-CM | POA: Diagnosis not present

## 2023-09-09 DIAGNOSIS — S0083XA Contusion of other part of head, initial encounter: Secondary | ICD-10-CM | POA: Insufficient documentation

## 2023-09-09 DIAGNOSIS — S0990XA Unspecified injury of head, initial encounter: Secondary | ICD-10-CM | POA: Diagnosis not present

## 2023-09-09 DIAGNOSIS — R220 Localized swelling, mass and lump, head: Secondary | ICD-10-CM | POA: Diagnosis not present

## 2023-09-09 DIAGNOSIS — S3993XA Unspecified injury of pelvis, initial encounter: Secondary | ICD-10-CM | POA: Diagnosis not present

## 2023-09-09 DIAGNOSIS — M48061 Spinal stenosis, lumbar region without neurogenic claudication: Secondary | ICD-10-CM | POA: Diagnosis not present

## 2023-09-09 LAB — CBC
HCT: 47.2 % — ABNORMAL HIGH (ref 36.0–46.0)
Hemoglobin: 15.9 g/dL — ABNORMAL HIGH (ref 12.0–15.0)
MCH: 31.3 pg (ref 26.0–34.0)
MCHC: 33.7 g/dL (ref 30.0–36.0)
MCV: 92.9 fL (ref 80.0–100.0)
Platelets: 219 10*3/uL (ref 150–400)
RBC: 5.08 MIL/uL (ref 3.87–5.11)
RDW: 14.8 % (ref 11.5–15.5)
WBC: 8 10*3/uL (ref 4.0–10.5)
nRBC: 0 % (ref 0.0–0.2)

## 2023-09-09 LAB — COMPREHENSIVE METABOLIC PANEL
ALT: 11 U/L (ref 0–44)
AST: 30 U/L (ref 15–41)
Albumin: 3.6 g/dL (ref 3.5–5.0)
Alkaline Phosphatase: 68 U/L (ref 38–126)
Anion gap: 16 — ABNORMAL HIGH (ref 5–15)
BUN: 22 mg/dL (ref 8–23)
CO2: 22 mmol/L (ref 22–32)
Calcium: 9.9 mg/dL (ref 8.9–10.3)
Chloride: 107 mmol/L (ref 98–111)
Creatinine, Ser: 0.98 mg/dL (ref 0.44–1.00)
GFR, Estimated: 55 mL/min — ABNORMAL LOW (ref >60)
Glucose, Bld: 88 mg/dL (ref 70–99)
Potassium: 4.3 mmol/L (ref 3.5–5.1)
Sodium: 145 mmol/L (ref 135–145)
Total Bilirubin: 0.6 mg/dL (ref 0.0–1.2)
Total Protein: 6.1 g/dL — ABNORMAL LOW (ref 6.5–8.1)

## 2023-09-09 LAB — URINALYSIS, ROUTINE W REFLEX MICROSCOPIC
Bilirubin Urine: NEGATIVE
Glucose, UA: NEGATIVE mg/dL
Hgb urine dipstick: NEGATIVE
Ketones, ur: NEGATIVE mg/dL
Leukocytes,Ua: NEGATIVE
Nitrite: NEGATIVE
Protein, ur: NEGATIVE mg/dL
Specific Gravity, Urine: 1.015 (ref 1.005–1.030)
pH: 6.5 (ref 5.0–8.0)

## 2023-09-09 LAB — TROPONIN I (HIGH SENSITIVITY)
Troponin I (High Sensitivity): 27 ng/L — ABNORMAL HIGH (ref <18)
Troponin I (High Sensitivity): 39 ng/L — ABNORMAL HIGH (ref <18)

## 2023-09-09 LAB — I-STAT CHEM 8, ED
BUN: 24 mg/dL — ABNORMAL HIGH (ref 8–23)
Calcium, Ion: 1.16 mmol/L (ref 1.15–1.40)
Chloride: 107 mmol/L (ref 98–111)
Creatinine, Ser: 0.9 mg/dL (ref 0.44–1.00)
Glucose, Bld: 88 mg/dL (ref 70–99)
HCT: 49 % — ABNORMAL HIGH (ref 36.0–46.0)
Hemoglobin: 16.7 g/dL — ABNORMAL HIGH (ref 12.0–15.0)
Potassium: 3.7 mmol/L (ref 3.5–5.1)
Sodium: 141 mmol/L (ref 135–145)
TCO2: 25 mmol/L (ref 22–32)

## 2023-09-09 MED ORDER — ACETAMINOPHEN 500 MG PO TABS
1000.0000 mg | ORAL_TABLET | Freq: Once | ORAL | Status: AC
  Administered 2023-09-09: 1000 mg via ORAL
  Filled 2023-09-09: qty 2

## 2023-09-09 MED ORDER — IOHEXOL 350 MG/ML SOLN: 75.0000 mL | Freq: Once | INTRAVENOUS | Status: DC | PRN

## 2023-09-09 NOTE — ED Notes (Signed)
 Pt placed on bed pan. Pt urinated. This NT cleaned pt and adjusted her in bed. Call bell within reach.

## 2023-09-09 NOTE — ED Triage Notes (Addendum)
 Patient arrived with EMS wearing C-collar from [facility] , staff found her on the floor this morning , history of Dementia , presents with hematoma at right side of head , low back pain  , neck pain , headache and left shoulder pain . Takes Eliquis.

## 2023-09-09 NOTE — ED Provider Notes (Signed)
 [place] EMERGENCY DEPARTMENT AT [facility] Provider Note   CSN: [id number] Arrival date & time: 09/09/23  4098     History  Chief Complaint  Patient presents with   Level 2 : Fall / Eliquis    Jessica Mullins is a 89 y.o. female.  This is a 89 year old female presenting emergency department as a level 2 trauma.  Fall on Eliquis.  She is a DNR.  Coming from [facility].  Staff reportedly heard her fall and found her on the floor.  Patient and her neurologic baseline which is demented/confused.  Complaining of pain to back of head and low back.  Denies chest pain, shortness of breath or abdominal pain.        Home Medications Prior to Admission medications   Medication Sig Start Date End Date Taking? Authorizing Provider  acetaminophen (TYLENOL) 500 MG tablet Take 1,000 mg by mouth in the morning.    [provider]  acetaminophen (TYLENOL) 500 MG tablet Take 1,000 mg by mouth 2 (two) times daily as needed for moderate pain.    [provider]  apixaban (ELIQUIS) 2.5 MG TABS tablet Take 1 tablet (2.5 mg total) by mouth 2 (two) times daily. 08/15/23   Croitoru, Mihai, MD  Calcium Carb-Cholecalciferol (CALCIUM 600+D3) 600-10 MG-MCG TABS Take 1 tablet by mouth in the morning.    [provider]  Cholecalciferol (VITAMIN D3) 1000 UNITS CAPS Take 1,000 Units by mouth in the morning.    [provider]  cycloSPORINE (RESTASIS) 0.05 % ophthalmic emulsion Place 1 drop into both eyes 2 (two) times daily.  after applying a hot compress 10/11/16   [provider]  denosumab (PROLIA) 60 MG/ML SOSY injection Inject 60 mg into the skin every 6 (six) months.    [provider]  escitalopram (LEXAPRO) 5 MG tablet Take 20 mg by mouth in the morning.    [provider]  hydrALAZINE (APRESOLINE) 10 MG tablet Take 10 mg by mouth every 8 (eight) hours as needed.    [provider]  hydrOXYzine (ATARAX) 10 MG tablet  Take 1 tablet (10 mg total) by mouth 2 (two) times daily as needed. 06/26/23   Fletcher Anon, NP  metoprolol succinate (TOPROL XL) 25 MG 24 hr tablet Take 25 mg every morning 06/08/22   Croitoru, Mihai, MD  mirtazapine (REMERON) 7.5 MG tablet Take 15 mg by mouth at bedtime.    [provider]  polyethylene glycol (MIRALAX / GLYCOLAX) 17 g packet Take 17 g by mouth in the morning.    [provider]  pravastatin (PRAVACHOL) 40 MG tablet TAKE ONE TABLET BY MOUTH ONCE DAILY 08/05/22   Mahlon Gammon, MD  senna (SENOKOT) 8.6 MG TABS tablet Take 2 tablets (17.2 mg total) by mouth at bedtime. 03/28/23   Fargo, Amy E, NP  sodium chloride (MURO 128) 5 % ophthalmic ointment Place 1 Application into both eyes at bedtime.    [provider]  vitamin B-12 (CYANOCOBALAMIN) 1000 MCG tablet Take 1 tablet (1,000 mcg total) by mouth daily. 07/19/18   Serena Croissant, MD      Allergies    Sulfa antibiotics, Tape, Wound dressing adhesive, Glucosamine forte [nutritional supplements], and Latex    Review of Systems   Review of Systems  Physical Exam Updated Vital Signs BP (!) 187/75   Pulse 65   Temp (!) 97.2 F (36.2 C) (Oral)   Resp 14   SpO2 100%  Physical Exam Vitals  reviewed.  Constitutional:      Appearance: Normal appearance.  HENT:     Head: Normocephalic.     Comments: Hematoma to back of head    Nose: Nose normal.     Mouth/Throat:     Mouth: Mucous membranes are moist.  Eyes:     Extraocular Movements: Extraocular movements intact.     Pupils: Pupils are equal, round, and reactive to light.  Neck:     Comments: C-collar in place Cardiovascular:     Rate and Rhythm: Normal rate and regular rhythm.     Pulses: Normal pulses.  Pulmonary:     Effort: Pulmonary effort is normal.     Breath sounds: Normal breath sounds.  Abdominal:     General: Abdomen is flat. There is no distension.     Tenderness: There is no abdominal tenderness. There is no guarding or  rebound.  Musculoskeletal:        General: No tenderness or deformity.  Skin:    General: Skin is warm and dry.     Capillary Refill: Capillary refill takes less than 2 seconds.  Neurological:     Mental Status: She is alert. Mental status is at baseline.     Comments: Moving all extremities uncoordinated fashion.  Psychiatric:     Comments: Calm and cooperative     ED Results / Procedures / Treatments   Labs (all labs ordered are listed, but only abnormal results are displayed) Labs Reviewed  I-STAT CHEM 8, ED - Abnormal; Notable for the following components:      Result Value   BUN 24 (*)    Hemoglobin 16.7 (*)    HCT 49.0 (*)    All other components within normal limits  COMPREHENSIVE METABOLIC PANEL  CBC  URINALYSIS, ROUTINE W REFLEX MICROSCOPIC  TROPONIN I (HIGH SENSITIVITY)    EKG None  Radiology No results found.  Procedures Procedures    Medications Ordered in ED Medications - No data to display  ED Course/ Medical Decision Making/ A&P Clinical Course as of 09/09/23 0700  Sat Sep 09, 2023  0650 EKG 12-Lead EKG appears to be A-fib rate of 68.  No ST segment changes to indicate ischemia.  Prolonged QTc. [TY]    Clinical Course User Index [TY] Coral Spikes, DO                                 Medical Decision Making This is a 89 year old female presenting emergency department as a trauma.  DNR.  Vital signs on arrival reassuring.  No obvious injuries on exam.  She is on Eliquis.  Unclear if patient syncopized or was mechanical.  Will get basic labs, troponin/EKG.  Will get CT head, C-spine and lumbar spine given her complaint of lower back pain.  Neurovascularly intact in lower extremities.  Care signed out to morning team.   Amount and/or Complexity of Data Reviewed Independent Historian:     Details: EMS reported patient at her neurologic baseline per staff facility External Data Reviewed:     Details: On Eliquis per chart review Labs: ordered.  Decision-making details documented in ED Course. Radiology: ordered. ECG/medicine tests: ordered. Decision-making details documented in ED Course.          Final Clinical Impression(s) / ED Diagnoses Final diagnoses:  None    Rx / DC Orders ED Discharge Orders     None  Coral Spikes, DO 09/09/23 0700

## 2023-09-09 NOTE — Discharge Instructions (Signed)
 Return for any problem.  ?

## 2023-09-09 NOTE — ED Notes (Signed)
 Patient transported to CT scan .

## 2023-09-09 NOTE — ED Provider Notes (Signed)
 Patient seen after prior EDP.  Case was discussed with patient's daughter Ms. Ray.  The patient's daughter agrees with plan for discharge home.  Workup did not reveal evidence of significant traumatic injury or other concurrent process.  Patient is appropriate for discharge.  Importance of close follow-up is stressed.  Patient's daughter understands need for close follow-up and strict return precautions.   Wynetta Fines, MD 09/09/23 1311

## 2023-09-09 NOTE — ED Notes (Signed)
 Trauma Response Nurse Documentation   Jessica Mullins is a 89 y.o. female arriving to [facility] ED via EMS  On Eliquis (apixaban) daily. Trauma was activated as a Level 2 by ED charge RN based on the following trauma criteria Elderly patients > 65 with head trauma on anti-coagulation (excluding ASA).  Patient cleared for CT by Dr. Maple Hudson EDP. Pt transported to CT with trauma response nurse present to monitor. RN remained with the patient throughout their absence from the department for clinical observation.   GCS 14 baseline   History   Past Medical History:  Diagnosis Date   Allergy    Ankle fracture, left    Atrial fibrillation (HCC)    Per [facility] New Patient Packet    Breast cancer (HCC) 1115/16   left    Bursitis of right shoulder    Cataract    Family history of cancer    Fracture of right wrist    GERD (gastroesophageal reflux disease)    Hyperlipidemia    Hypertension    Lumbar radiculitis    Per [facility] New Patient Packet    Meningioma [facility])    Per [facility] New Patient Packet    Skin cancer 2000   melanoma and basal cell   Stress fracture    Right Heel, Per [facility] New Patient Packet    TIA (transient ischemic attack) 2022   Vaso vagal episode    during preparation for colonoscopy     Past Surgical History:  Procedure Laterality Date   ABDOMINAL HYSTERECTOMY  1988   TAH/BSO--FIBROIDS   APPENDECTOMY     BREAST LUMPECTOMY     B/L--[facility]   BREAST LUMPECTOMY WITH RADIOACTIVE SEED AND SENTINEL LYMPH NODE BIOPSY Left 06/11/2015   Procedure: LEFT BREAST LUMPECTOMY WITH RADIOACTIVE SEED AND LEFT SENTINEL LYMPH NODE MAPPING;  Surgeon: Harriette Bouillon, MD;  Location: [facility] OR;  Service: General;  Laterality: Left;   CARDIOVERSION N/A 01/11/2022   Procedure: CARDIOVERSION;  Surgeon: Jodelle Red, MD;  Location: [facility] ENDOSCOPY;  Service: Cardiovascular;  Laterality: N/A;   CATARACT EXTRACTION  2015   COLONOSCOPY  2010   DG  BONE DENSITY ([facility] HX)     EYE SURGERY Bilateral     cataracts   fiberadenoma Bilateral 1978, 1980   GANGLION CYST EXCISION     L  hand   Lipiflow procedure         Initial Focused Assessment (If applicable, or please see trauma documentation): Confused female from SNF with unwitnessed fall. Hematoma R side of head, lower back pain.  Airway patent, BS clear No obvious uncontrolled hemorrhage GCS 14 PERRLA 3  CT's Completed:   CT Head and CT C-Spine  L spine Interventions:  IV start and trauma lab draw Portable chest and pelvis XRAY CT head and neck, L spine [place] J collar EKG  Event Summary: Presents via EMS from SNF for unwitnessed fall. I established IV access and placed pt in [place] J collar. XRAYS completed, escorted to CT. Handoff at shift change, dispo pending imaging. MTP Summary (If applicable): NA  Bedside handoff with ED RN Karen Kitchens.    Vera Furniss O Yemariam Magar  Trauma Response RN  Please call TRN at [phone] for further assistance.

## 2023-09-11 DIAGNOSIS — Z9181 History of falling: Secondary | ICD-10-CM | POA: Diagnosis not present

## 2023-09-11 DIAGNOSIS — R531 Weakness: Secondary | ICD-10-CM | POA: Diagnosis not present

## 2023-09-11 DIAGNOSIS — R278 Other lack of coordination: Secondary | ICD-10-CM | POA: Diagnosis not present

## 2023-09-13 DIAGNOSIS — R278 Other lack of coordination: Secondary | ICD-10-CM | POA: Diagnosis not present

## 2023-09-13 DIAGNOSIS — R531 Weakness: Secondary | ICD-10-CM | POA: Diagnosis not present

## 2023-09-13 DIAGNOSIS — Z9181 History of falling: Secondary | ICD-10-CM | POA: Diagnosis not present

## 2023-09-14 ENCOUNTER — Ambulatory Visit: Payer: Medicare PPO

## 2023-09-14 DIAGNOSIS — M810 Age-related osteoporosis without current pathological fracture: Secondary | ICD-10-CM

## 2023-09-14 DIAGNOSIS — M816 Localized osteoporosis [Lequesne]: Secondary | ICD-10-CM

## 2023-09-14 MED ORDER — DENOSUMAB 60 MG/ML ~~LOC~~ SOSY: 60.0000 mg | PREFILLED_SYRINGE | SUBCUTANEOUS | Status: DC

## 2023-09-14 NOTE — Progress Notes (Signed)
 Patient is in office today for a nurse visit for  prolia injection . Patient Injection was given in the  Left deltoid. Patient tolerated injection well.

## 2023-09-15 DIAGNOSIS — Z9181 History of falling: Secondary | ICD-10-CM | POA: Diagnosis not present

## 2023-09-15 DIAGNOSIS — R278 Other lack of coordination: Secondary | ICD-10-CM | POA: Diagnosis not present

## 2023-09-15 DIAGNOSIS — R531 Weakness: Secondary | ICD-10-CM | POA: Diagnosis not present

## 2023-09-18 DIAGNOSIS — R531 Weakness: Secondary | ICD-10-CM | POA: Diagnosis not present

## 2023-09-18 DIAGNOSIS — Z9181 History of falling: Secondary | ICD-10-CM | POA: Diagnosis not present

## 2023-09-18 DIAGNOSIS — R278 Other lack of coordination: Secondary | ICD-10-CM | POA: Diagnosis not present

## 2023-09-20 DIAGNOSIS — R278 Other lack of coordination: Secondary | ICD-10-CM | POA: Diagnosis not present

## 2023-09-20 DIAGNOSIS — Z9181 History of falling: Secondary | ICD-10-CM | POA: Diagnosis not present

## 2023-09-20 DIAGNOSIS — R531 Weakness: Secondary | ICD-10-CM | POA: Diagnosis not present

## 2023-09-22 DIAGNOSIS — Z9181 History of falling: Secondary | ICD-10-CM | POA: Diagnosis not present

## 2023-09-22 DIAGNOSIS — R278 Other lack of coordination: Secondary | ICD-10-CM | POA: Diagnosis not present

## 2023-09-22 DIAGNOSIS — R531 Weakness: Secondary | ICD-10-CM | POA: Diagnosis not present

## 2023-09-25 ENCOUNTER — Non-Acute Institutional Stay (SKILLED_NURSING_FACILITY): Payer: Self-pay | Admitting: Internal Medicine

## 2023-09-25 DIAGNOSIS — M810 Age-related osteoporosis without current pathological fracture: Secondary | ICD-10-CM

## 2023-09-25 DIAGNOSIS — D329 Benign neoplasm of meninges, unspecified: Secondary | ICD-10-CM

## 2023-09-25 DIAGNOSIS — Z9181 History of falling: Secondary | ICD-10-CM | POA: Diagnosis not present

## 2023-09-25 DIAGNOSIS — F329 Major depressive disorder, single episode, unspecified: Secondary | ICD-10-CM

## 2023-09-25 DIAGNOSIS — E7800 Pure hypercholesterolemia, unspecified: Secondary | ICD-10-CM

## 2023-09-25 DIAGNOSIS — R296 Repeated falls: Secondary | ICD-10-CM | POA: Diagnosis not present

## 2023-09-25 DIAGNOSIS — I10 Essential (primary) hypertension: Secondary | ICD-10-CM

## 2023-09-25 DIAGNOSIS — I480 Paroxysmal atrial fibrillation: Secondary | ICD-10-CM | POA: Diagnosis not present

## 2023-09-25 DIAGNOSIS — R278 Other lack of coordination: Secondary | ICD-10-CM | POA: Diagnosis not present

## 2023-09-25 DIAGNOSIS — R531 Weakness: Secondary | ICD-10-CM | POA: Diagnosis not present

## 2023-09-25 NOTE — Progress Notes (Signed)
 Location:  Medical illustrator of Service:  SNF ([id number])  Provider:   Code Status: DNR Goals of Care:     09/09/2023    6:47 AM  Advanced Directives  Does Patient Have a Medical Advance Directive? No     Chief Complaint  Patient presents with   Care Management    HPI: Patient is a 89 y.o. female seen today for medical management of chronic diseases.   She is now lives in [place] in [place]   Patient has known h/o Meningioma.  MRI it showed meningioma again which has increased in size causing compression on medulla Not Surgical candidate H/o Low Back Pain with Radiculopathy follows with Neurology H/o Breast Cancer Invasive Ductal Carcinoma diagnosed in 2016 On Prolia for Osteoporosis H/o PAF  Follows with Cardiology On Eliquis Also h/o Bradycardia Hypertension with Tendency for Orthostatic Hypotension  Patient had another fall Last night She always forget to take her Dan Humphreys with her  Sustained a skin abrasion on her back  She seems at her baseline today Did not have any complains Continue to have issues with her Memory Per nurses her Confusion is worse especially in the evening shift Wt Readings from Last 3 Encounters:  09/25/23 138 lb 9.6 oz (62.9 kg)  08/15/23 137 lb 3.2 oz (62.2 kg)  08/15/23 140 lb (63.5 kg)    Has gained weight    Past Medical History:  Diagnosis Date   Allergy    Ankle fracture, left    Atrial fibrillation (HCC)    Per [facility] New Patient Packet    Breast cancer (HCC) 1115/16   left    Bursitis of right shoulder    Cataract    Family history of cancer    Fracture of right wrist    GERD (gastroesophageal reflux disease)    Hyperlipidemia    Hypertension    Lumbar radiculitis    Per [facility] New Patient Packet    Meningioma [facility])    Per [facility] New Patient Packet    Skin cancer 2000   melanoma and basal cell   Stress fracture    Right Heel, Per [facility] New Patient Packet    TIA (transient ischemic attack) 2022   Vaso vagal episode     during preparation for colonoscopy    Past Surgical History:  Procedure Laterality Date   ABDOMINAL HYSTERECTOMY  1988   TAH/BSO--FIBROIDS   APPENDECTOMY     BREAST LUMPECTOMY     B/L--[facility]   BREAST LUMPECTOMY WITH RADIOACTIVE SEED AND SENTINEL LYMPH NODE BIOPSY Left 06/11/2015   Procedure: LEFT BREAST LUMPECTOMY WITH RADIOACTIVE SEED AND LEFT SENTINEL LYMPH NODE MAPPING;  Surgeon: Harriette Bouillon, MD;  Location: [facility] OR;  Service: General;  Laterality: Left;   CARDIOVERSION N/A 01/11/2022   Procedure: CARDIOVERSION;  Surgeon: Jodelle Red, MD;  Location: [facility] ENDOSCOPY;  Service: Cardiovascular;  Laterality: N/A;   CATARACT EXTRACTION  2015   COLONOSCOPY  2010   DG  BONE DENSITY ([facility] HX)     EYE SURGERY Bilateral    cataracts   fiberadenoma Bilateral 1978, 1980   GANGLION CYST EXCISION     L  hand   Lipiflow procedure      Allergies  Allergen Reactions   Sulfa Antibiotics Other (See Comments)    As a child, became "rigid as a stick and not responsive"   Tape Other (See Comments)    Blisters, Please use "paper" tape only for short periods of time  Wound Dressing Adhesive Other (See Comments)    Blisters, Please use "paper" tape only for short periods of time, Blisters, Please use "paper" tape only for short periods of time   Glucosamine Forte [Nutritional Supplements] Rash   Latex Rash    Outpatient Encounter Medications as of 09/25/2023  Medication Sig   acetaminophen (TYLENOL) 500 MG tablet Take 1,000 mg by mouth in the morning.   acetaminophen (TYLENOL) 500 MG tablet Take 1,000 mg by mouth 2 (two) times daily as needed for moderate pain.   apixaban (ELIQUIS) 2.5 MG TABS tablet Take 1 tablet (2.5 mg total) by mouth 2 (two) times daily.   Calcium Carb-Cholecalciferol (CALCIUM 600+D3) 600-10 MG-MCG TABS Take 1 tablet by mouth in the morning.   Cholecalciferol (VITAMIN D3) 1000 UNITS CAPS Take 1,000 Units by mouth in the morning.   cycloSPORINE (RESTASIS) 0.05 %  ophthalmic emulsion Place 1 drop into both eyes 2 (two) times daily.  after applying a hot compress   denosumab (PROLIA) 60 MG/ML SOSY injection Inject 60 mg into the skin every 6 (six) months.   escitalopram (LEXAPRO) 5 MG tablet Take 20 mg by mouth in the morning.   hydrALAZINE (APRESOLINE) 10 MG tablet Take 10 mg by mouth every 8 (eight) hours as needed.   hydrOXYzine (ATARAX) 10 MG tablet Take 1 tablet (10 mg total) by mouth 2 (two) times daily as needed.   metoprolol succinate (TOPROL XL) 25 MG 24 hr tablet Take 25 mg every morning   mirtazapine (REMERON) 7.5 MG tablet Take 15 mg by mouth at bedtime.   polyethylene glycol (MIRALAX / GLYCOLAX) 17 g packet Take 17 g by mouth in the morning.   pravastatin (PRAVACHOL) 40 MG tablet TAKE ONE TABLET BY MOUTH ONCE DAILY   senna (SENOKOT) 8.6 MG TABS tablet Take 2 tablets (17.2 mg total) by mouth at bedtime.   sodium chloride (MURO 128) 5 % ophthalmic ointment Place 1 Application into both eyes at bedtime.   vitamin B-12 (CYANOCOBALAMIN) 1000 MCG tablet Take 1 tablet (1,000 mcg total) by mouth daily.   Facility-Administered Encounter Medications as of 09/25/2023  Medication   [START ON 03/19/2024] denosumab (PROLIA) injection 60 mg    Review of Systems:  Review of Systems  Constitutional:  Negative for activity change and appetite change.  HENT: Negative.    Respiratory:  Negative for cough and shortness of breath.   Cardiovascular:  Negative for leg swelling.  Gastrointestinal:  Negative for constipation.  Genitourinary: Negative.   Musculoskeletal:  Positive for gait problem. Negative for arthralgias and myalgias.  Skin: Negative.   Neurological:  Negative for dizziness and weakness.  Psychiatric/Behavioral:  Positive for confusion. Negative for dysphoric mood and sleep disturbance.     Health Maintenance  Topic Date Due   MAMMOGRAM  07/20/2024 (Originally 07/12/2023)   COVID-19 Vaccine (7 - Pfizer risk 2024-25 season) 10/17/2023    INFLUENZA VACCINE  01/26/2024   Medicare Annual Wellness (AWV)  03/06/2024   DTaP/Tdap/Td (6 - Td or Tdap) 10/24/2030   Pneumonia Vaccine 58+ Years old  Completed   DEXA SCAN  Completed   Zoster Vaccines- Shingrix  Completed   HPV VACCINES  Aged Out   Meningococcal B Vaccine  Aged Out    Physical Exam: Vitals:   09/25/23 1421  BP: 118/82  Pulse: 60  Temp: 97.6 F (36.4 C)  Weight: 138 lb 9.6 oz (62.9 kg)   Body mass index is 31.07 kg/m. Physical Exam Vitals reviewed.  Constitutional:  Appearance: Normal appearance.  HENT:     Head: Normocephalic.     Nose: Nose normal.     Mouth/Throat:     Mouth: Mucous membranes are moist.     Pharynx: Oropharynx is clear.  Eyes:     Pupils: Pupils are equal, round, and reactive to light.  Cardiovascular:     Rate and Rhythm: Normal rate and regular rhythm.     Pulses: Normal pulses.     Heart sounds: Normal heart sounds. No murmur heard. Pulmonary:     Effort: Pulmonary effort is normal.     Breath sounds: Normal breath sounds.  Abdominal:     General: Abdomen is flat. Bowel sounds are normal.     Palpations: Abdomen is soft.  Musculoskeletal:        General: No swelling.     Cervical back: Neck supple.  Skin:    General: Skin is warm.  Neurological:     General: No focal deficit present.     Mental Status: She is alert.  Psychiatric:        Mood and Affect: Mood normal.        Thought Content: Thought content normal.     Labs reviewed: Basic Metabolic Panel: Recent Labs    12/04/22 1906 12/11/22 1937 12/12/22 0859 12/13/22 0411 02/23/23 0959 03/23/23 0000 09/09/23 0650 09/09/23 0652  NA 141   < > 139   < > 140 144 141 145  K 4.1   < > 3.8   < > 3.7 4.7 3.7 4.3  CL 106   < > 101   < > 101 106 107 107  CO2 26   < > 25   < > 31 26*  --  22  GLUCOSE 96   < > 88   < > 76  --  88 88  BUN 28*   < > 13   < > 15 18 24* 22  CREATININE 1.06*   < > 0.77   < > 0.94 0.8 0.90 0.98  CALCIUM 9.6   < > 9.2   < > 9.8  9.8  --  9.9  MG 2.5*  --   --   --   --   --   --   --   TSH  --   --  3.099  --   --   --   --   --    < > = values in this interval not displayed.   Liver Function Tests: Recent Labs    01/30/23 2123 02/23/23 0959 09/09/23 0652  AST 28 24 30   ALT 18 15 11   ALKPHOS 55 50 68  BILITOT 1.2 1.3* 0.6  PROT 5.7* 5.8* 6.1*  ALBUMIN 3.5 3.5 3.6   No results for input(s): "LIPASE", "AMYLASE" in the last 8760 hours. Recent Labs    12/11/22 2043  AMMONIA 12   CBC: Recent Labs    12/04/22 1906 12/11/22 1937 12/12/22 0859 01/30/23 2123 01/30/23 2133 02/23/23 0959 09/09/23 0650 09/09/23 0652  WBC 8.5 9.1   < > 8.8  --  8.6  --  8.0  NEUTROABS 5.8 5.9  --   --   --  6.5  --   --   HGB 15.3* 17.6*   < > 14.9   < > 15.4* 16.7* 15.9*  HCT 45.3 51.4*   < > 43.8   < > 45.7 49.0* 47.2*  MCV 92.1 91.8   < > 90.7  --  90.9  --  92.9  PLT 191 217   < > 208  --  208  --  219   < > = values in this interval not displayed.   Lipid Panel: No results for input(s): "CHOL", "HDL", "LDLCALC", "TRIG", "CHOLHDL", "LDLDIRECT" in the last 8760 hours. Lab Results  Component Value Date   HGBA1C 4.8 05/11/2021    Procedures since last visit: DG Chest Port 1 View Result Date: 09/09/2023 CLINICAL DATA:  Trauma. Staff found patient on floor this morning. History of dementia. EXAM: PORTABLE CHEST 1 VIEW COMPARISON:  02/23/2023 FINDINGS: Stable cardiomediastinal contours. Aortic atherosclerosis. Calcifications of the mitral valve. No signs of pleural effusion, interstitial edema or airspace disease. Surgical clips noted in the left breast. Visualized osseous structures appear intact. IMPRESSION: No acute cardiopulmonary abnormalities. Electronically Signed   By: Signa Kell M.D.   On: 09/09/2023 07:47   DG Pelvis Portable Result Date: 09/09/2023 CLINICAL DATA:  Trauma EXAM: PORTABLE PELVIS 1 VIEWS COMPARISON:  02/23/2023 FINDINGS: Artifact from EKG leads. Clips distributed across the pelvis. No  evidence of hip fracture or pelvic ring fracture. No malalignment. IMPRESSION: No acute finding Electronically Signed   By: Tiburcio Pea M.D.   On: 09/09/2023 07:47   CT Lumbar Spine Wo Contrast Result Date: 09/09/2023 CLINICAL DATA:  Back trauma EXAM: CT LUMBAR SPINE WITHOUT CONTRAST TECHNIQUE: Multidetector CT imaging of the lumbar spine was performed without intravenous contrast administration. Multiplanar CT image reconstructions were also generated. RADIATION DOSE REDUCTION: This exam was performed according to the departmental dose-optimization program which includes automated exposure control, adjustment of the mA and/or kV according to patient size and/or use of iterative reconstruction technique. COMPARISON:  12/03/2022 FINDINGS: Segmentation: 5 lumbar type vertebrae. Alignment: Anterolisthesis at L4-5 and L5-S1, grade 1. Vertebrae: Chronic pars defects at L5. Subjective generalized osteopenia. No acute fracture or aggressive bone lesion. Paraspinal and other soft tissues: No perispinal hematoma or masslike finding. Subcentimeter angiomyolipoma in the bilateral kidney. Disc levels: Spurring and disc collapse with endplate degeneration especially at L4-5 and L5-S1. L3-4 notable disc bulging and ligamentum flavum thickening. Compressive spinal stenosis at L4-5 and especially L3-4. Foraminal impingement on the left at L3-4 to L5-S1 and on the right at L3-4, L4-5 primarily. IMPRESSION: No acute finding. Advanced lumbar spine degeneration with high-grade stenoses. Electronically Signed   By: Tiburcio Pea M.D.   On: 09/09/2023 07:17   CT HEAD WO CONTRAST Result Date: 09/09/2023 CLINICAL DATA:  Head trauma, moderate to severe. EXAM: CT HEAD WITHOUT CONTRAST CT CERVICAL SPINE WITHOUT CONTRAST TECHNIQUE: Multidetector CT imaging of the head and cervical spine was performed following the standard protocol without intravenous contrast. Multiplanar CT image reconstructions of the cervical spine were also  generated. RADIATION DOSE REDUCTION: This exam was performed according to the departmental dose-optimization program which includes automated exposure control, adjustment of the mA and/or kV according to patient size and/or use of iterative reconstruction technique. COMPARISON:  02/23/2023 FINDINGS: CT HEAD FINDINGS Brain: Large isodense mass at the right paramedian foramen magnum and posterior fossa with brainstem mass effect is unchanged, up to 3.3 cm where visible on axial images. Chronic small vessel ischemia in the periventricular white matter. There is ventriculomegaly that is stable and likely from atrophy. Vascular: No hyperdense vessel or unexpected calcification. Skull: Posterior scalp swelling without acute fracture. Sinuses/Orbits: No acute finding. CT CERVICAL SPINE FINDINGS Alignment: No traumatic malalignment Skull base and vertebrae: No acute fracture Soft tissues and spinal canal: Mass at the craniocervical junction as  described above. Disc levels:  Ordinary and generalized degenerative spurring. Upper chest: Interlobular septal thickening at the apices, there is pending chest radiograph. IMPRESSION: 1. No evidence of acute intracranial or cervical spine injury. Stable from prior. 2. Known meningioma in the low posterior fossa compressing the brainstem. Electronically Signed   By: Tiburcio Pea M.D.   On: 09/09/2023 07:13   CT CERVICAL SPINE WO CONTRAST Result Date: 09/09/2023 CLINICAL DATA:  Head trauma, moderate to severe. EXAM: CT HEAD WITHOUT CONTRAST CT CERVICAL SPINE WITHOUT CONTRAST TECHNIQUE: Multidetector CT imaging of the head and cervical spine was performed following the standard protocol without intravenous contrast. Multiplanar CT image reconstructions of the cervical spine were also generated. RADIATION DOSE REDUCTION: This exam was performed according to the departmental dose-optimization program which includes automated exposure control, adjustment of the mA and/or kV according  to patient size and/or use of iterative reconstruction technique. COMPARISON:  02/23/2023 FINDINGS: CT HEAD FINDINGS Brain: Large isodense mass at the right paramedian foramen magnum and posterior fossa with brainstem mass effect is unchanged, up to 3.3 cm where visible on axial images. Chronic small vessel ischemia in the periventricular white matter. There is ventriculomegaly that is stable and likely from atrophy. Vascular: No hyperdense vessel or unexpected calcification. Skull: Posterior scalp swelling without acute fracture. Sinuses/Orbits: No acute finding. CT CERVICAL SPINE FINDINGS Alignment: No traumatic malalignment Skull base and vertebrae: No acute fracture Soft tissues and spinal canal: Mass at the craniocervical junction as described above. Disc levels:  Ordinary and generalized degenerative spurring. Upper chest: Interlobular septal thickening at the apices, there is pending chest radiograph. IMPRESSION: 1. No evidence of acute intracranial or cervical spine injury. Stable from prior. 2. Known meningioma in the low posterior fossa compressing the brainstem. Electronically Signed   By: Tiburcio Pea M.D.   On: 09/09/2023 07:13    Assessment/Plan 1. Recurrent falls (Primary) Continues to be her main issue it is due to her Cognition and Unstable Gait Forget to get her walker  2. Age-related osteoporosis without current pathological fracture Prolia per [facility] Dec and March 3. Paroxysmal atrial fibrillation (HCC) Lower Dose of Eliquis for now Metoprolol  4. Essential hypertension Loose Control due to her Falls Metoprolol  5. Pure hypercholesterolemia Statin Will Need follow up of her Lipid  6. Meningioma (HCC) No more work up as not candidate for surgery  7. Reactive depression Lexapro and Remeron 8  History of breast cancer In Remission 9 Cognitive impairment Recent Worsening MMSE 19/30  Labs/tests ordered:  * No order type specified * Next appt:  03/19/2024

## 2023-09-27 DIAGNOSIS — R531 Weakness: Secondary | ICD-10-CM | POA: Diagnosis not present

## 2023-09-27 DIAGNOSIS — R278 Other lack of coordination: Secondary | ICD-10-CM | POA: Diagnosis not present

## 2023-09-27 DIAGNOSIS — Z9181 History of falling: Secondary | ICD-10-CM | POA: Diagnosis not present

## 2023-09-28 DIAGNOSIS — R531 Weakness: Secondary | ICD-10-CM | POA: Diagnosis not present

## 2023-09-28 DIAGNOSIS — Z9181 History of falling: Secondary | ICD-10-CM | POA: Diagnosis not present

## 2023-09-28 DIAGNOSIS — R278 Other lack of coordination: Secondary | ICD-10-CM | POA: Diagnosis not present

## 2023-10-02 DIAGNOSIS — R531 Weakness: Secondary | ICD-10-CM | POA: Diagnosis not present

## 2023-10-02 DIAGNOSIS — R278 Other lack of coordination: Secondary | ICD-10-CM | POA: Diagnosis not present

## 2023-10-02 DIAGNOSIS — Z9181 History of falling: Secondary | ICD-10-CM | POA: Diagnosis not present

## 2023-10-04 DIAGNOSIS — Z9181 History of falling: Secondary | ICD-10-CM | POA: Diagnosis not present

## 2023-10-04 DIAGNOSIS — R531 Weakness: Secondary | ICD-10-CM | POA: Diagnosis not present

## 2023-10-04 DIAGNOSIS — R278 Other lack of coordination: Secondary | ICD-10-CM | POA: Diagnosis not present

## 2023-10-05 ENCOUNTER — Encounter: Payer: Self-pay | Admitting: Internal Medicine

## 2023-10-06 DIAGNOSIS — R278 Other lack of coordination: Secondary | ICD-10-CM | POA: Diagnosis not present

## 2023-10-06 DIAGNOSIS — Z9181 History of falling: Secondary | ICD-10-CM | POA: Diagnosis not present

## 2023-10-06 DIAGNOSIS — R531 Weakness: Secondary | ICD-10-CM | POA: Diagnosis not present

## 2023-10-10 ENCOUNTER — Non-Acute Institutional Stay (SKILLED_NURSING_FACILITY): Payer: Self-pay | Admitting: Orthopedic Surgery

## 2023-10-10 ENCOUNTER — Encounter: Payer: Self-pay | Admitting: Orthopedic Surgery

## 2023-10-10 DIAGNOSIS — R296 Repeated falls: Secondary | ICD-10-CM

## 2023-10-10 DIAGNOSIS — F03911 Unspecified dementia, unspecified severity, with agitation: Secondary | ICD-10-CM

## 2023-10-10 MED ORDER — HYDROXYZINE HCL 10 MG PO TABS: 10.0000 mg | ORAL_TABLET | Freq: Two times a day (BID) | ORAL | Status: AC | PRN

## 2023-10-10 NOTE — Progress Notes (Signed)
 Location:  [facility] Room Number: 127/A Place of Service:  SNF [phone]) Provider:  Arnetha Bhat, NP   Marguerite Shiley, MD  Patient Care Team: Marguerite Shiley, MD as PCP - General (Internal Medicine) Luana Rumple, MD as PCP - Cardiology (Cardiology) Denman Fischer, MD as Consulting Physician (Dermatology) Corie Diamond, MD as Consulting Physician (Ophthalmology) Merriam Abbey, DO as Consulting Physician (Neurology) Debbie Fails Laura Polio, NP as Nurse Practitioner (Hematology and Oncology)  Extended Emergency Contact Information Primary Emergency Contact: Ray,Ruth  [place] Home Phone: [phone] Mobile Phone: [phone] Relation: Daughter Secondary Emergency Contact: Alyne Babinski  [place] Home Phone: [phone] Relation: Son  Code Status:  DNR Goals of care: Advanced Directive information    09/09/2023    6:47 AM  Advanced Directives  Does Patient Have a Medical Advance Directive? No     Chief Complaint  Patient presents with   Acute Visit    Agitation/sundowning    HPI:  Pt is a 89 y.o. female seen today for acute visit due to agitation/sundowning.   She currently resides on the skilled nursing unit (admitted 02/23/2023) at [facility] due to weakness. PMH: PAF, HTN, HLD, left breast cancer, meningioma, osteopenia, and gait abnormality.   Worsening memory within past 3 months. Also having frequent falls. MMSE 19/30 02/27/2023. Nursing reports increased agitation with exit seeking behaviors beginning around 3-4 pm. She has also been verbally agressive with staff when they try to redirect her or assist her with ADLs. No injuries with recent falls except abrasion. She forgets to use walker. She remains on Lexapro and Remeron for depression. Afebrile. Vitals stable.   Meningioma-  08/29 CT head noted 3.1 x 2.7 cm meningioma> increased in size pressing on medulla, no surgical intervention per  neurosurgery/neurology   Past Surgical History:  Procedure Laterality Date   ABDOMINAL HYSTERECTOMY  1988   TAH/BSO--FIBROIDS   APPENDECTOMY     BREAST LUMPECTOMY     B/L--[facility]   BREAST LUMPECTOMY WITH RADIOACTIVE SEED AND SENTINEL LYMPH NODE BIOPSY Left 06/11/2015   Procedure: LEFT BREAST LUMPECTOMY WITH RADIOACTIVE SEED AND LEFT SENTINEL LYMPH NODE MAPPING;  Surgeon: Sim Dryer, MD;  Location: [facility] OR;  Service: General;  Laterality: Left;   CARDIOVERSION N/A 01/11/2022   Procedure: CARDIOVERSION;  Surgeon: Sheryle Donning, MD;  Location: [facility] ENDOSCOPY;  Service: Cardiovascular;  Laterality: N/A;   CATARACT EXTRACTION  2015   COLONOSCOPY  2010   DG  BONE DENSITY ([facility] HX)     EYE SURGERY Bilateral    cataracts   fiberadenoma Bilateral 1978, 1980   GANGLION CYST EXCISION     L  hand   Lipiflow procedure      Allergies  Allergen Reactions   Sulfa Antibiotics Other (See Comments)    As a child, became "rigid as a stick and not responsive"   Tape Other (See Comments)    Blisters, Please use "paper" tape only for short periods of time   Wound Dressing Adhesive Other (See Comments)    Blisters, Please use "paper" tape only for short periods of time, Blisters, Please use "paper" tape only for short periods of time   Glucosamine Forte [Nutritional Supplements] Rash   Latex Rash    Outpatient Encounter Medications as of 10/10/2023  Medication Sig   acetaminophen (TYLENOL) 500 MG tablet Take 1,000 mg by mouth in the morning.   acetaminophen (TYLENOL) 500 MG tablet Take 1,000 mg by mouth 2 (two) times daily as  needed for moderate pain.   apixaban (ELIQUIS) 2.5 MG TABS tablet Take 1 tablet (2.5 mg total) by mouth 2 (two) times daily.   Calcium Carb-Cholecalciferol (CALCIUM 600+D3) 600-10 MG-MCG TABS Take 1 tablet by mouth in the morning.   Cholecalciferol (VITAMIN D3) 1000 UNITS CAPS Take 1,000 Units by mouth in the morning.   cycloSPORINE (RESTASIS) 0.05 % ophthalmic emulsion  Place 1 drop into both eyes 2 (two) times daily.  after applying a hot compress   denosumab (PROLIA) 60 MG/ML SOSY injection Inject 60 mg into the skin every 6 (six) months.   escitalopram (LEXAPRO) 5 MG tablet Take 20 mg by mouth in the morning.   hydrALAZINE (APRESOLINE) 10 MG tablet Take 10 mg by mouth every 8 (eight) hours as needed.   hydrOXYzine (ATARAX) 10 MG tablet Take 1 tablet (10 mg total) by mouth 2 (two) times daily as needed.   metoprolol succinate (TOPROL XL) 25 MG 24 hr tablet Take 25 mg every morning   mirtazapine (REMERON) 7.5 MG tablet Take 15 mg by mouth at bedtime.   polyethylene glycol (MIRALAX / GLYCOLAX) 17 g packet Take 17 g by mouth in the morning.   pravastatin (PRAVACHOL) 40 MG tablet TAKE ONE TABLET BY MOUTH ONCE DAILY   senna (SENOKOT) 8.6 MG TABS tablet Take 2 tablets (17.2 mg total) by mouth at bedtime.   sodium chloride (MURO 128) 5 % ophthalmic ointment Place 1 Application into both eyes at bedtime.   vitamin B-12 (CYANOCOBALAMIN) 1000 MCG tablet Take 1 tablet (1,000 mcg total) by mouth daily.   Facility-Administered Encounter Medications as of 10/10/2023  Medication   [START ON 03/19/2024] denosumab (PROLIA) injection 60 mg    Review of Systems  Unable to perform ROS: Dementia    Immunization History  Administered Date(s) Administered   Fluad Quad(high Dose 65+) 02/27/2020, 03/29/2022   H1N1 07/01/2008   Influenza Whole 04/10/2007, 04/01/2009, 03/26/2010, 03/25/2012   Influenza, High Dose Seasonal PF 04/16/2014, 04/09/2015, 04/03/2017, 04/19/2018, 02/28/2019, 03/08/2021, 04/18/2023   Influenza-Unspecified 06/27/2016, 04/03/2017   Moderna Sars-Covid-2 Vaccination 04/27/2022   PFIZER Comirnaty(Gray Top)Covid-19 Tri-Sucrose Vaccine 01/05/2021   PFIZER(Purple Top)SARS-COV-2 Vaccination 07/18/2019, 08/08/2019, 03/29/2020   Pneumococcal Conjugate-13 05/13/2014   Pneumococcal Polysaccharide-23 08/29/2016   Td 11/05/2003   Tdap 06/28/2011, 09/19/2011,  10/23/2020   Tetanus 10/23/2020   Unspecified SARS-COV-2 Vaccination 04/18/2023   Zoster Recombinant(Shingrix) 10/08/2016, 01/03/2017   Zoster, Live 02/24/2010   Pertinent  Health Maintenance Due  Topic Date Due   MAMMOGRAM  07/20/2024 (Originally 07/12/2023)   INFLUENZA VACCINE  01/26/2024   DEXA SCAN  Completed      03/07/2023    2:38 PM 04/25/2023    1:52 PM 05/23/2023    3:02 PM 06/19/2023    4:14 PM 08/15/2023    5:28 PM  Fall Risk  Falls in the past year? 1 1 1  0 1  Was there an injury with Fall? 1 1 1  0 0  Fall Risk Category Calculator 3 3 3  0 1  Patient at Risk for Falls Due to History of fall(s);Impaired balance/gait;Impaired mobility History of fall(s);Impaired balance/gait;Impaired mobility History of fall(s);Impaired balance/gait;Impaired mobility No Fall Risks History of fall(s);Impaired balance/gait  Fall risk Follow up Falls evaluation completed;Education provided;Falls prevention discussed Falls evaluation completed;Education provided;Falls prevention discussed Falls evaluation completed;Education provided;Falls prevention discussed Falls evaluation completed Falls evaluation completed;Education provided   Functional Status Survey:    Vitals:   10/10/23 1232  BP: 115/80  Resp: 17  Temp: (!) 97 F (36.1 C)  SpO2: 96%  Weight: 136 lb 12.8 oz (62.1 kg)  Height: 4\' 8"  (1.422 m)   Body mass index is 30.67 kg/m. Physical Exam Vitals reviewed.  Constitutional:      General: She is not in acute distress. HENT:     Head: Normocephalic.  Eyes:     General:        Right eye: No discharge.        Left eye: No discharge.  Cardiovascular:     Rate and Rhythm: Normal rate and regular rhythm.     Pulses: Normal pulses.     Heart sounds: Normal heart sounds.  Pulmonary:     Effort: Pulmonary effort is normal.     Breath sounds: Normal breath sounds.  Abdominal:     General: Bowel sounds are normal.     Palpations: Abdomen is soft.  Musculoskeletal:      Cervical back: Neck supple.     Right lower leg: No edema.     Left lower leg: No edema.  Skin:    General: Skin is warm.     Capillary Refill: Capillary refill takes less than 2 seconds.  Neurological:     General: No focal deficit present.     Mental Status: She is alert. Mental status is at baseline.     Motor: Weakness present.     Gait: Gait abnormal.  Psychiatric:        Mood and Affect: Mood normal.     Labs reviewed: Recent Labs    12/04/22 1906 12/11/22 1937 02/23/23 0959 03/23/23 0000 09/09/23 0650 09/09/23 0652  NA 141   < > 140 144 141 145  K 4.1   < > 3.7 4.7 3.7 4.3  CL 106   < > 101 106 107 107  CO2 26   < > 31 26*  --  22  GLUCOSE 96   < > 76  --  88 88  BUN 28*   < > 15 18 24* 22  CREATININE 1.06*   < > 0.94 0.8 0.90 0.98  CALCIUM 9.6   < > 9.8 9.8  --  9.9  MG 2.5*  --   --   --   --   --    < > = values in this interval not displayed.   Recent Labs    01/30/23 2123 02/23/23 0959 09/09/23 0652  AST 28 24 30   ALT 18 15 11   ALKPHOS 55 50 68  BILITOT 1.2 1.3* 0.6  PROT 5.7* 5.8* 6.1*  ALBUMIN 3.5 3.5 3.6   Recent Labs    12/04/22 1906 12/11/22 1937 12/12/22 0859 01/30/23 2123 01/30/23 2133 02/23/23 0959 09/09/23 0650 09/09/23 0652  WBC 8.5 9.1   < > 8.8  --  8.6  --  8.0  NEUTROABS 5.8 5.9  --   --   --  6.5  --   --   HGB 15.3* 17.6*   < > 14.9   < > 15.4* 16.7* 15.9*  HCT 45.3 51.4*   < > 43.8   < > 45.7 49.0* 47.2*  MCV 92.1 91.8   < > 90.7  --  90.9  --  92.9  PLT 191 217   < > 208  --  208  --  219   < > = values in this interval not displayed.   Lab Results  Component Value Date   TSH 3.099 12/12/2022   Lab Results  Component Value Date  HGBA1C 4.8 05/11/2021   Lab Results  Component Value Date   CHOL 152 08/04/2022   HDL 63 08/04/2022   LDLCALC 70 08/04/2022   LDLDIRECT 153.4 11/16/2009   TRIG 91 08/04/2022   CHOLHDL 2.5 05/11/2021    Significant Diagnostic Results in last 30 days:  No results  found.  Assessment/Plan 1. Agitation due to dementia [facility]) (Primary) - yelling at staff, exit seeking, behaviors starting at 3-4 pm - last MMSE 19/30 02/2023 - suspect sundowning related to dementia - hydroxyzine started in past but stopped due to non use - restart low dose hydroxyzine 10 mg po BID x 14 days> give usage report to PCP - repeat MMSE> MMSE 18/30 10/11/2023 - recommend wander guard - hydrOXYzine (ATARAX) 10 MG tablet; Take 1 tablet (10 mg total) by mouth 2 (two) times daily as needed for up to 14 days.  2. Frequent falls - poor safety awareness related to dementia - see above   Family/ staff Communication: plan discussed with patient and nurse  Labs/tests ordered: repeat MMSE

## 2023-10-16 ENCOUNTER — Non-Acute Institutional Stay (SKILLED_NURSING_FACILITY): Payer: Self-pay | Admitting: Adult Health

## 2023-10-16 DIAGNOSIS — G8929 Other chronic pain: Secondary | ICD-10-CM

## 2023-10-16 DIAGNOSIS — M545 Low back pain, unspecified: Secondary | ICD-10-CM

## 2023-10-16 DIAGNOSIS — M25512 Pain in left shoulder: Secondary | ICD-10-CM | POA: Diagnosis not present

## 2023-10-16 NOTE — Progress Notes (Deleted)
 Location:  [facility] Room Number: 127 A Place of Service:  SNF [phone]) Provider:  Raylene Calamity, NP   Patient Care Team: Marguerite Shiley, MD as PCP - General (Internal Medicine) Croitoru, Karyl Paget, MD as PCP - Cardiology (Cardiology) Denman Fischer, MD as Consulting Physician (Dermatology) Corie Diamond, MD as Consulting Physician (Ophthalmology) Merriam Abbey, DO as Consulting Physician (Neurology) Debbie Fails Laura Polio, NP as Nurse Practitioner (Hematology and Oncology)  Extended Emergency Contact Information Primary Emergency Contact: Ray,Ruth  [place] Home Phone: [phone] Mobile Phone: [phone] Relation: Daughter Secondary Emergency Contact: Lengel,Rich  [place] Home Phone: [phone] Relation: Son  Code Status:  DNR Goals of care: Advanced Directive information    09/09/2023    6:47 AM  Advanced Directives  Does Patient Have a Medical Advance Directive? No     Chief Complaint  Patient presents with  . Acute Visit    Left shoulder pain  . Error    HPI:  Pt is a 89 y.o. female seen today for an acute visit for    Past Medical History:  Diagnosis Date  . Allergy   . Ankle fracture, left   . Atrial fibrillation [facility])    Per [facility] New Patient Packet   . Breast cancer (HCC) 1115/16   left   . Bursitis of right shoulder   . Cataract   . Family history of cancer   . Fracture of right wrist   . GERD (gastroesophageal reflux disease)   . Hyperlipidemia   . Hypertension   . Lumbar radiculitis    Per [facility] New Patient Packet   . Meningioma [facility])    Per [facility] New Patient Packet   . Skin cancer 2000   melanoma and basal cell  . Stress fracture    Right Heel, Per [facility] New Patient Packet   . TIA (transient ischemic attack) 2022  . Vaso vagal episode    during preparation for colonoscopy   Past Surgical History:  Procedure Laterality Date  . ABDOMINAL HYSTERECTOMY  1988    TAH/BSO--FIBROIDS  . APPENDECTOMY    . BREAST LUMPECTOMY     B/L--[facility]  . BREAST LUMPECTOMY WITH RADIOACTIVE SEED AND SENTINEL LYMPH NODE BIOPSY Left 06/11/2015   Procedure: LEFT BREAST LUMPECTOMY WITH RADIOACTIVE SEED AND LEFT SENTINEL LYMPH NODE MAPPING;  Surgeon: Sim Dryer, MD;  Location: [facility] OR;  Service: General;  Laterality: Left;  . CARDIOVERSION N/A 01/11/2022   Procedure: CARDIOVERSION;  Surgeon: Sheryle Donning, MD;  Location: [facility] ENDOSCOPY;  Service: Cardiovascular;  Laterality: N/A;  . CATARACT EXTRACTION  2015  . COLONOSCOPY  2010  . DG  BONE DENSITY ([facility] HX)    . EYE SURGERY Bilateral    cataracts  . fiberadenoma Bilateral 1978, 1980  . GANGLION CYST EXCISION     L  hand  . Lipiflow procedure      Allergies  Allergen Reactions  . Sulfa Antibiotics Other (See Comments)    As a child, became "rigid as a stick and not responsive"  . Tape Other (See Comments)    Blisters, Please use "paper" tape only for short periods of time  . Wound Dressing Adhesive Other (See Comments)    Blisters, Please use "paper" tape only for short periods of time, Blisters, Please use "paper" tape only for short periods of time  . Glucosamine Forte [Nutritional Supplements] Rash  . Latex Rash    Outpatient Encounter Medications as of 10/16/2023  Medication Sig  .  acetaminophen  (TYLENOL ) 500 MG tablet Take 1,000 mg by mouth in the morning.  . acetaminophen  (TYLENOL ) 500 MG tablet Take 1,000 mg by mouth 2 (two) times daily as needed for moderate pain.  . apixaban  (ELIQUIS ) 2.5 MG TABS tablet Take 1 tablet (2.5 mg total) by mouth 2 (two) times daily.  . Calcium  Carb-Cholecalciferol  (CALCIUM  600+D3) 600-10 MG-MCG TABS Take 1 tablet by mouth in the morning.  . Cholecalciferol  (VITAMIN D3) 1000 UNITS CAPS Take 1,000 Units by mouth in the morning.  . cycloSPORINE  (RESTASIS ) 0.05 % ophthalmic emulsion Place 1 drop into both eyes 2 (two) times daily.  after applying a hot compress  .  escitalopram (LEXAPRO) 5 MG tablet Take 20 mg by mouth in the morning.  . hydrALAZINE (APRESOLINE) 10 MG tablet Take 10 mg by mouth every 8 (eight) hours as needed.  . hydrOXYzine  (ATARAX ) 10 MG tablet Take 1 tablet (10 mg total) by mouth 2 (two) times daily as needed for up to 14 days.  . metoprolol  succinate (TOPROL  XL) 25 MG 24 hr tablet Take 25 mg every morning  . mirtazapine (REMERON) 7.5 MG tablet Take 15 mg by mouth at bedtime.  . nystatin  powder Apply 1 Application topically 2 (two) times daily. 100,000 units/g, topical, Twice a day, Apply Nystatin  powder to reddened areas BID (under L breast), if not healed after 7 days notify MD for further orders.  . polyethylene glycol (MIRALAX  / GLYCOLAX ) 17 g packet Take 17 g by mouth in the morning.  . pravastatin  (PRAVACHOL ) 40 MG tablet TAKE ONE TABLET BY MOUTH ONCE DAILY  . senna (SENOKOT) 8.6 MG TABS tablet Take 2 tablets (17.2 mg total) by mouth at bedtime.  . sodium chloride  (MURO 128) 5 % ophthalmic ointment Place 1 Application into both eyes at bedtime.  . vitamin B-12 (CYANOCOBALAMIN ) 1000 MCG tablet Take 1 tablet (1,000 mcg total) by mouth daily.  . denosumab  (PROLIA ) 60 MG/ML SOSY injection Inject 60 mg into the skin every 6 (six) months. (Patient not taking: Reported on 10/16/2023)   Facility-Administered Encounter Medications as of 10/16/2023  Medication  . [START ON 03/19/2024] denosumab  (PROLIA ) injection 60 mg    Review of Systems  Immunization History  Administered Date(s) Administered  . Fluad Quad(high Dose 65+) 02/27/2020, 03/29/2022  . H1N1 07/01/2008  . Influenza Whole 04/10/2007, 04/01/2009, 03/26/2010, 03/25/2012  . Influenza, High Dose Seasonal PF 04/16/2014, 04/09/2015, 04/03/2017, 04/19/2018, 02/28/2019, 03/08/2021, 04/18/2023  . Influenza-Unspecified 06/27/2016, 04/03/2017  . Moderna Sars-Covid-2 Vaccination 04/27/2022  . PFIZER Comirnaty(Gray Top)Covid-19 Tri-Sucrose Vaccine 01/05/2021  . PFIZER(Purple  Top)SARS-COV-2 Vaccination 07/18/2019, 08/08/2019, 03/29/2020  . Pneumococcal Conjugate-13 05/13/2014  . Pneumococcal Polysaccharide-23 08/29/2016  . Td 11/05/2003  . Tdap 06/28/2011, 09/19/2011, 10/23/2020  . Tetanus 10/23/2020  . Unspecified SARS-COV-2 Vaccination 04/18/2023  . Zoster Recombinant(Shingrix) 10/08/2016, 01/03/2017  . Zoster, Live 02/24/2010   Pertinent  Health Maintenance Due  Topic Date Due  . MAMMOGRAM  07/20/2024 (Originally 07/12/2023)  . INFLUENZA VACCINE  01/26/2024  . DEXA SCAN  Completed      04/25/2023    1:52 PM 05/23/2023    3:02 PM 06/19/2023    4:14 PM 08/15/2023    5:28 PM 10/10/2023    3:19 PM  Fall Risk  Falls in the past year? 1 1 0 1 1  Was there an injury with Fall? 1 1 0 0 1  Fall Risk Category Calculator 3 3 0 1 3  Patient at Risk for Falls Due to History of fall(s);Impaired balance/gait;Impaired mobility History  of fall(s);Impaired balance/gait;Impaired mobility No Fall Risks History of fall(s);Impaired balance/gait History of fall(s);Impaired balance/gait;Impaired mobility  Fall risk Follow up Falls evaluation completed;Education provided;Falls prevention discussed Falls evaluation completed;Education provided;Falls prevention discussed Falls evaluation completed Falls evaluation completed;Education provided Falls evaluation completed;Education provided   Functional Status Survey:    Vitals:   10/16/23 1100  BP: 107/75  Pulse: 70  Resp: 18  Temp: (!) 97 F (36.1 C)  SpO2: 96%  Weight: 138 lb 9.6 oz (62.9 kg)  Height: 4\' 8"  (1.422 m)   Body mass index is 31.07 kg/m. Physical Exam  Labs reviewed: Recent Labs    12/04/22 1906 12/11/22 1937 02/23/23 0959 03/23/23 0000 08/24/23 0000 09/09/23 0650 09/09/23 0652  NA 141   < > 140 144 142 141 145  K 4.1   < > 3.7 4.7 4.1 3.7 4.3  CL 106   < > 101 106 108 107 107  CO2 26   < > 31 26* 24*  --  22  GLUCOSE 96   < > 76  --   --  88 88  BUN 28*   < > 15 18 26* 24* 22  CREATININE  1.06*   < > 0.94 0.8 0.7 0.90 0.98  CALCIUM  9.6   < > 9.8 9.8 9.3  --  9.9  MG 2.5*  --   --   --   --   --   --    < > = values in this interval not displayed.    Recent Labs    01/30/23 2123 02/23/23 0959 08/24/23 0000 09/09/23 0652  AST 28 24 24 30   ALT 18 15 20 11   ALKPHOS 55 50 98 68  BILITOT 1.2 1.3*  --  0.6  PROT 5.7* 5.8*  --  6.1*  ALBUMIN 3.5 3.5 3.6 3.6    Recent Labs    12/04/22 1906 12/11/22 1937 12/12/22 0859 01/30/23 2123 01/30/23 2133 02/23/23 0959 08/24/23 0000 09/09/23 0650 09/09/23 0652  WBC 8.5 9.1   < > 8.8  --  8.6 6.6  --  8.0  NEUTROABS 5.8 5.9  --   --   --  6.5  --   --   --   HGB 15.3* 17.6*   < > 14.9   < > 15.4* 13.7 16.7* 15.9*  HCT 45.3 51.4*   < > 43.8   < > 45.7 40 49.0* 47.2*  MCV 92.1 91.8   < > 90.7  --  90.9  --   --  92.9  PLT 191 217   < > 208  --  208 180  --  219   < > = values in this interval not displayed.    Lab Results  Component Value Date   TSH 2.16 08/24/2023   Lab Results  Component Value Date   HGBA1C 4.8 05/11/2021   Lab Results  Component Value Date   CHOL 122 08/24/2023   HDL 58 08/24/2023   LDLCALC 53 08/24/2023   LDLDIRECT 153.4 11/16/2009   TRIG 57 08/24/2023   CHOLHDL 2.5 05/11/2021    Significant Diagnostic Results in last 30 days:  No results found.  Assessment/Plan There are no diagnoses linked to this encounter.   Family/ staff Communication: ***  Labs/tests ordered:  ***  This encounter was created in error - please disregard.

## 2023-10-17 ENCOUNTER — Encounter: Payer: Self-pay | Admitting: Adult Health

## 2023-10-17 NOTE — Progress Notes (Unsigned)
 Location:  [facility] Room Number: 127 A Place of Service:  SNF [phone]) Provider: Raylene Calamity, NP   Patient Care Team: Marguerite Shiley, MD as PCP - General (Internal Medicine) Croitoru, Karyl Paget, MD as PCP - Cardiology (Cardiology) Denman Fischer, MD as Consulting Physician (Dermatology) Corie Diamond, MD as Consulting Physician (Ophthalmology) Merriam Abbey, DO as Consulting Physician (Neurology) Debbie Fails Laura Polio, NP as Nurse Practitioner (Hematology and Oncology)  Extended Emergency Contact Information Primary Emergency Contact: Ray,Ruth  [place] Home Phone: [phone] Mobile Phone: [phone] Relation: Daughter Secondary Emergency Contact: Lafon,Rich  [place] Home Phone: [phone] Relation: Son  Code Status:  DNR Goals of care: Advanced Directive information    09/09/2023    6:47 AM  Advanced Directives  Does Patient Have a Medical Advance Directive? No     Chief Complaint  Patient presents with   Acute Visit    Left shoulder pain    HPI:  Pt is a 89 y.o. female seen today for an acute visit for left shoulder pain  Request from the nurse was made to help control her shoulder pain. This is a non radiating pain present for several months. Pt is using tylenol  with little relief each am and has tried voltaren with no benefit. At times also has low back pain. No surgical hx of the shoulder. Prior hx of bursitis. On DOAC. Has frequent falls. No reports of falls that led to the shoulder pain as it has been present for months. Ms. B has dementia and can not provide a hx. At the time of the visit neither her back or shoulder are hurting her but there are reports that she does complaint at times. She is also experiencing some evening time agitation. Also has OP on Prolia    She had a CT of the lumbar spine done after a fall and was seen in the ED March 2025  CT of the lumbar spine  09/09/23  Segmentation: 5 lumbar type vertebrae.   Alignment: Anterolisthesis at L4-5 and L5-S1, grade 1.   Vertebrae: Chronic pars defects at L5. Subjective generalized osteopenia. No acute fracture or aggressive bone lesion.   Paraspinal and other soft tissues: No perispinal hematoma or masslike finding. Subcentimeter angiomyolipoma in the bilateral kidney.   Disc levels: Spurring and disc collapse with endplate degeneration especially at L4-5 and L5-S1. L3-4 notable disc bulging and ligamentum flavum thickening. Compressive spinal stenosis at L4-5 and especially L3-4. Foraminal impingement on the left at L3-4 to L5-S1 and on the right at L3-4, L4-5 primarily.   IMPRESSION: No acute finding.   Advanced lumbar spine degeneration with high-grade stenoses.   Past Medical History:  Diagnosis Date   Allergy    Ankle fracture, left    Atrial fibrillation (HCC)    Per [facility] New Patient Packet    Breast cancer (HCC) 1115/16   left    Bursitis of right shoulder    Cataract    Family history of cancer    Fracture of right wrist    GERD (gastroesophageal reflux disease)    Hyperlipidemia    Hypertension    Lumbar radiculitis    Per [facility] New Patient Packet    Meningi[facility])    Per [facility] New Patient Packet    Skin cancer 2000   melanoma and basal cell   Stress fracture    Right Heel, Per [facility] New Patient Packet    TIA (transient ischemic attack) 2022  Vaso vagal episode    during preparation for colonoscopy   Past Surgical History:  Procedure Laterality Date   ABDOMINAL HYSTERECTOMY  1988   TAH/BSO--FIBROIDS   APPENDECTOMY     BREAST LUMPECTOMY     B/L--[facility]   BREAST LUMPECTOMY WITH RADIOACTIVE SEED AND SENTINEL LYMPH NODE BIOPSY Left 06/11/2015   Procedure: LEFT BREAST LUMPECTOMY WITH RADIOACTIVE SEED AND LEFT SENTINEL LYMPH NODE MAPPING;  Surgeon: Sim Dryer, MD;  Location: [facility] OR;  Service: General;  Laterality: Left;   CARDIOVERSION N/A 01/11/2022   Procedure:  CARDIOVERSION;  Surgeon: Sheryle Donning, MD;  Location: [facility] ENDOSCOPY;  Service: Cardiovascular;  Laterality: N/A;   CATARACT EXTRACTION  2015   COLONOSCOPY  2010   DG  BONE DENSITY ([facility] HX)     EYE SURGERY Bilateral    cataracts   fiberadenoma Bilateral 1978, 1980   GANGLION CYST EXCISION     L  hand   Lipiflow procedure      Allergies  Allergen Reactions   Sulfa Antibiotics Other (See Comments)    As a child, became "rigid as a stick and not responsive"   Tape Other (See Comments)    Blisters, Please use "paper" tape only for short periods of time   Wound Dressing Adhesive Other (See Comments)    Blisters, Please use "paper" tape only for short periods of time, Blisters, Please use "paper" tape only for short periods of time   Glucosamine Forte [Nutritional Supplements] Rash   Latex Rash    Outpatient Encounter Medications as of 10/16/2023  Medication Sig   acetaminophen  (TYLENOL ) 500 MG tablet Take 1,000 mg by mouth in the morning.   acetaminophen  (TYLENOL ) 500 MG tablet Take 1,000 mg by mouth 2 (two) times daily as needed for moderate pain.   apixaban  (ELIQUIS ) 2.5 MG TABS tablet Take 1 tablet (2.5 mg total) by mouth 2 (two) times daily.   Calcium  Carb-Cholecalciferol  (CALCIUM  600+D3) 600-10 MG-MCG TABS Take 1 tablet by mouth in the morning.   Cholecalciferol  (VITAMIN D3) 1000 UNITS CAPS Take 1,000 Units by mouth in the morning.   cycloSPORINE  (RESTASIS ) 0.05 % ophthalmic emulsion Place 1 drop into both eyes 2 (two) times daily.  after applying a hot compress   denosumab  (PROLIA ) 60 MG/ML SOSY injection Inject 60 mg into the skin every 6 (six) months.   escitalopram (LEXAPRO) 5 MG tablet Take 20 mg by mouth in the morning.   hydrALAZINE (APRESOLINE) 10 MG tablet Take 10 mg by mouth every 8 (eight) hours as needed.   hydrOXYzine  (ATARAX ) 10 MG tablet Take 1 tablet (10 mg total) by mouth 2 (two) times daily as needed for up to 14 days.   metoprolol  succinate (TOPROL  XL)  25 MG 24 hr tablet Take 25 mg every morning   mirtazapine (REMERON) 7.5 MG tablet Take 15 mg by mouth at bedtime.   nystatin  powder Apply 1 Application topically 2 (two) times daily. 100,000 units/g, topical, Twice a day, Apply Nystatin  powder to reddened areas BID (under L breast), if not healed after 7 days notify MD for further orders.   polyethylene glycol (MIRALAX  / GLYCOLAX ) 17 g packet Take 17 g by mouth in the morning.   pravastatin  (PRAVACHOL ) 40 MG tablet TAKE ONE TABLET BY MOUTH ONCE DAILY   senna (SENOKOT) 8.6 MG TABS tablet Take 2 tablets (17.2 mg total) by mouth at bedtime.   sodium chloride  (MURO 128) 5 % ophthalmic ointment Place 1 Application into both eyes at bedtime.   vitamin  B-12 (CYANOCOBALAMIN ) 1000 MCG tablet Take 1 tablet (1,000 mcg total) by mouth daily.   Facility-Administered Encounter Medications as of 10/16/2023  Medication   [START ON 03/19/2024] denosumab  (PROLIA ) injection 60 mg    Review of Systems  Constitutional:  Negative for activity change, appetite change, chills, diaphoresis, fatigue, fever and unexpected weight change.  Musculoskeletal:  Positive for arthralgias, back pain and gait problem. Negative for joint swelling, myalgias, neck pain and neck stiffness.  Psychiatric/Behavioral:  Positive for behavioral problems and confusion.     Immunization History  Administered Date(s) Administered   Fluad Quad(high Dose 65+) 02/27/2020, 03/29/2022   H1N1 07/01/2008   Influenza Whole 04/10/2007, 04/01/2009, 03/26/2010, 03/25/2012   Influenza, High Dose Seasonal PF 04/16/2014, 04/09/2015, 04/03/2017, 04/19/2018, 02/28/2019, 03/08/2021, 04/18/2023   Influenza-Unspecified 06/27/2016, 04/03/2017   Moderna Sars-Covid-2 Vaccination 04/27/2022   PFIZER Comirnaty(Gray Top)Covid-19 Tri-Sucrose Vaccine 01/05/2021   PFIZER(Purple Top)SARS-COV-2 Vaccination 07/18/2019, 08/08/2019, 03/29/2020   Pneumococcal Conjugate-13 05/13/2014   Pneumococcal Polysaccharide-23  08/29/2016   Td 11/05/2003   Tdap 06/28/2011, 09/19/2011, 10/23/2020   Tetanus 10/23/2020   Unspecified SARS-COV-2 Vaccination 04/18/2023   Zoster Recombinant(Shingrix) 10/08/2016, 01/03/2017   Zoster, Live 02/24/2010   Pertinent  Health Maintenance Due  Topic Date Due   MAMMOGRAM  07/20/2024 (Originally 07/12/2023)   INFLUENZA VACCINE  01/26/2024   DEXA SCAN  Completed      04/25/2023    1:52 PM 05/23/2023    3:02 PM 06/19/2023    4:14 PM 08/15/2023    5:28 PM 10/10/2023    3:19 PM  Fall Risk  Falls in the past year? 1 1 0 1 1  Was there an injury with Fall? 1 1 0 0 1  Fall Risk Category Calculator 3 3 0 1 3  Patient at Risk for Falls Due to History of fall(s);Impaired balance/gait;Impaired mobility History of fall(s);Impaired balance/gait;Impaired mobility No Fall Risks History of fall(s);Impaired balance/gait History of fall(s);Impaired balance/gait;Impaired mobility  Fall risk Follow up Falls evaluation completed;Education provided;Falls prevention discussed Falls evaluation completed;Education provided;Falls prevention discussed Falls evaluation completed Falls evaluation completed;Education provided Falls evaluation completed;Education provided   Functional Status Survey:    Vitals:   10/16/23 1559  BP: 107/75  Pulse: 70  Resp: 18  Temp: (!) 97 F (36.1 C)  SpO2: 96%  Weight: 138 lb 9.6 oz (62.9 kg)  Height: 4\' 8"  (1.422 m)   Body mass index is 31.07 kg/m. Physical Exam Vitals and nursing note reviewed.  Musculoskeletal:        General: No swelling, tenderness, deformity or signs of injury.       Arms:     Lumbar back: Normal.     Right lower leg: No edema.     Left lower leg: No edema.  Skin:    General: Skin is warm and dry.  Neurological:     Mental Status: Mental status is at baseline.     Labs reviewed: Recent Labs    12/04/22 1906 12/11/22 1937 02/23/23 0959 03/23/23 0000 08/24/23 0000 09/09/23 0650 09/09/23 0652  NA 141   < > 140 144 142  141 145  K 4.1   < > 3.7 4.7 4.1 3.7 4.3  CL 106   < > 101 106 108 107 107  CO2 26   < > 31 26* 24*  --  22  GLUCOSE 96   < > 76  --   --  88 88  BUN 28*   < > 15 18 26* 24* 22  CREATININE  1.06*   < > 0.94 0.8 0.7 0.90 0.98  CALCIUM  9.6   < > 9.8 9.8 9.3  --  9.9  MG 2.5*  --   --   --   --   --   --    < > = values in this interval not displayed.   Recent Labs    01/30/23 2123 02/23/23 0959 08/24/23 0000 09/09/23 0652  AST 28 24 24 30   ALT 18 15 20 11   ALKPHOS 55 50 98 68  BILITOT 1.2 1.3*  --  0.6  PROT 5.7* 5.8*  --  6.1*  ALBUMIN 3.5 3.5 3.6 3.6   Recent Labs    12/04/22 1906 12/11/22 1937 12/12/22 0859 01/30/23 2123 01/30/23 2133 02/23/23 0959 08/24/23 0000 09/09/23 0650 09/09/23 0652  WBC 8.5 9.1   < > 8.8  --  8.6 6.6  --  8.0  NEUTROABS 5.8 5.9  --   --   --  6.5  --   --   --   HGB 15.3* 17.6*   < > 14.9   < > 15.4* 13.7 16.7* 15.9*  HCT 45.3 51.4*   < > 43.8   < > 45.7 40 49.0* 47.2*  MCV 92.1 91.8   < > 90.7  --  90.9  --   --  92.9  PLT 191 217   < > 208  --  208 180  --  219   < > = values in this interval not displayed.   Lab Results  Component Value Date   TSH 2.16 08/24/2023   Lab Results  Component Value Date   HGBA1C 4.8 05/11/2021   Lab Results  Component Value Date   CHOL 122 08/24/2023   HDL 58 08/24/2023   LDLCALC 53 08/24/2023   LDLDIRECT 153.4 11/16/2009   TRIG 57 08/24/2023   CHOLHDL 2.5 05/11/2021    Significant Diagnostic Results in last 30 days:  No results found.  Assessment/Plan  1. Chronic left shoulder pain (Primary) Increase tylenol  to 1 gram TID Prednisone  10 mg daily for 7 days (low dose due to recent agitation) Avoiding nsaids due to doac use.  If not improving consider ortho referral, likely has severe OA and rotator cuff issues  2. Chronic bilateral low back pain without sciatica Advanced lumbar degeneration and high grade stenoses on CT  See above.    Labs/tests ordered:  shoulder xray left

## 2023-10-18 ENCOUNTER — Encounter: Payer: Self-pay | Admitting: Adult Health

## 2023-10-18 DIAGNOSIS — H16223 Keratoconjunctivitis sicca, not specified as Sjogren's, bilateral: Secondary | ICD-10-CM | POA: Diagnosis not present

## 2023-10-18 DIAGNOSIS — H353131 Nonexudative age-related macular degeneration, bilateral, early dry stage: Secondary | ICD-10-CM | POA: Diagnosis not present

## 2023-10-24 ENCOUNTER — Non-Acute Institutional Stay (SKILLED_NURSING_FACILITY): Payer: Self-pay | Admitting: Orthopedic Surgery

## 2023-10-24 ENCOUNTER — Encounter: Payer: Self-pay | Admitting: Orthopedic Surgery

## 2023-10-24 DIAGNOSIS — F329 Major depressive disorder, single episode, unspecified: Secondary | ICD-10-CM | POA: Diagnosis not present

## 2023-10-24 DIAGNOSIS — I10 Essential (primary) hypertension: Secondary | ICD-10-CM

## 2023-10-24 DIAGNOSIS — G8929 Other chronic pain: Secondary | ICD-10-CM | POA: Diagnosis not present

## 2023-10-24 DIAGNOSIS — M25512 Pain in left shoulder: Secondary | ICD-10-CM | POA: Diagnosis not present

## 2023-10-24 DIAGNOSIS — M8580 Other specified disorders of bone density and structure, unspecified site: Secondary | ICD-10-CM

## 2023-10-24 DIAGNOSIS — I480 Paroxysmal atrial fibrillation: Secondary | ICD-10-CM

## 2023-10-24 DIAGNOSIS — E7800 Pure hypercholesterolemia, unspecified: Secondary | ICD-10-CM | POA: Diagnosis not present

## 2023-10-24 DIAGNOSIS — D329 Benign neoplasm of meninges, unspecified: Secondary | ICD-10-CM

## 2023-10-24 DIAGNOSIS — F03911 Unspecified dementia, unspecified severity, with agitation: Secondary | ICD-10-CM

## 2023-10-24 MED ORDER — DIVALPROEX SODIUM 125 MG PO DR TAB
125.0000 mg | DELAYED_RELEASE_TABLET | Freq: Two times a day (BID) | ORAL | Status: DC
Start: 2023-10-24 — End: 2024-01-01

## 2023-10-24 NOTE — Progress Notes (Signed)
 Location:  [facility] Room Number: 127/A Place of Service:  SNF [phone]) Provider:  Arnetha Bhat, NP   Marguerite Shiley, MD  Patient Care Team: Marguerite Shiley, MD as PCP - General (Internal Medicine) Luana Rumple, MD as PCP - Cardiology (Cardiology) Denman Fischer, MD as Consulting Physician (Dermatology) Corie Diamond, MD as Consulting Physician (Ophthalmology) Merriam Abbey, DO as Consulting Physician (Neurology) Debbie Fails Laura Polio, NP as Nurse Practitioner (Hematology and Oncology)  Extended Emergency Contact Information Primary Emergency Contact: Ray,Ruth  [place] Home Phone: [phone] Mobile Phone: [phone] Relation: Daughter Secondary Emergency Contact: Soroka,Rich  [place] Home Phone: [phone] Relation: Son  Code Status:  DNR Goals of care: Advanced Directive information    09/09/2023    6:47 AM  Advanced Directives  Does Patient Have a Medical Advance Directive? No     Chief Complaint  Patient presents with   Medical Management of Chronic Issues    HPI:  Pt is a 89 y.o. female seen today for medical management of chronic diseases.    She currently resides on the skilled nursing unit (admitted 02/23/2023) at [facility] due to weakness. PMH: PAF, HTN, HLD, left breast cancer, meningioma, osteopenia, and gait abnormality.     HTN- BUN/creat 22/0.98 09/09/2023, remains on low dose metoprolol  and hydralazine for SBP> 175 HLD- total 122/ LDL 53 08/24/2023, remains on pravastatin  PAF-  h/o cardioversion, off Cardizem  due to worsening bradycardia, HR< 100 with metoprolol , remains on low dose Eliquis  for clot prevention  Dementia/abnormal gait/agitation- 08/29 CT head noted periventricular and deep white matter hypodensity, MMSE 19/30 02/27/2023, intermittent agitation> verbally aggressive language at other residents> hydroxyzine  ineffective, poor safety awareness and worsening gait,  remains on vistaril  Meningioma-  began as leg paresthesias last year, 08/29 CT head noted 3.1 x 2.7 cm meningioma> increased in size over years, no surgical intervention per neurosurgery/neurology Reactive depression- Na+ 145 09/09/2023, remains on Lexapro and Remeron Senile osteopenia- DEXA 06/2021, t score -1.1, remains on Prolia  injections> scheduled 09/14/2023 Chronic left shoulder pain- no past surgery or fall/injury, no recent complaints per nursing, 04/21 prednisone  10 mg x 7 days, remains on tylenol  1000 mg TID  Recent blood pressures:  04/27- 137/88  04/20- 107/75  04/13- 115/80  Recent weights:  4/01- 138.6 lbs  03/01- 139.8 lbs  02/01- 137.2 lbs    Past Medical History:  Diagnosis Date   Allergy    Ankle fracture, left    Atrial fibrillation (HCC)    Per [facility] New Patient Packet    Breast cancer (HCC) 1115/16   left    Bursitis of right shoulder    Cataract    Family history of cancer    Fracture of right wrist    GERD (gastroesophageal reflux disease)    Hyperlipidemia    Hypertension    Lumbar radiculitis    Per [facility] New Patient Packet    Meningioma [facility])    Per [facility] New Patient Packet    Skin cancer 2000   melanoma and basal cell   Stress fracture    Right Heel, Per [facility] New Patient Packet    TIA (transient ischemic attack) 2022   Vaso vagal episode    during preparation for colonoscopy   Past Surgical History:  Procedure Laterality Date   ABDOMINAL HYSTERECTOMY  1988   TAH/BSO--FIBROIDS   APPENDECTOMY     BREAST LUMPECTOMY     B/L--[facility]   BREAST LUMPECTOMY WITH RADIOACTIVE SEED AND  SENTINEL LYMPH NODE BIOPSY Left 06/11/2015   Procedure: LEFT BREAST LUMPECTOMY WITH RADIOACTIVE SEED AND LEFT SENTINEL LYMPH NODE MAPPING;  Surgeon: Sim Dryer, MD;  Location: [facility] OR;  Service: General;  Laterality: Left;   CARDIOVERSION N/A 01/11/2022   Procedure: CARDIOVERSION;  Surgeon: Sheryle Donning, MD;  Location: [facility] ENDOSCOPY;  Service: Cardiovascular;   Laterality: N/A;   CATARACT EXTRACTION  2015   COLONOSCOPY  2010   DG  BONE DENSITY ([facility] HX)     EYE SURGERY Bilateral    cataracts   fiberadenoma Bilateral 1978, 1980   GANGLION CYST EXCISION     L  hand   Lipiflow procedure      Allergies  Allergen Reactions   Sulfa Antibiotics Other (See Comments)    As a child, became "rigid as a stick and not responsive"   Tape Other (See Comments)    Blisters, Please use "paper" tape only for short periods of time   Wound Dressing Adhesive Other (See Comments)    Blisters, Please use "paper" tape only for short periods of time, Blisters, Please use "paper" tape only for short periods of time   Glucosamine Forte [Nutritional Supplements] Rash   Latex Rash    Outpatient Encounter Medications as of 10/24/2023  Medication Sig   acetaminophen  (TYLENOL ) 500 MG tablet Take 1,000 mg by mouth in the morning.   acetaminophen  (TYLENOL ) 500 MG tablet Take 1,000 mg by mouth 2 (two) times daily as needed for moderate pain.   apixaban  (ELIQUIS ) 2.5 MG TABS tablet Take 1 tablet (2.5 mg total) by mouth 2 (two) times daily.   Calcium  Carb-Cholecalciferol  (CALCIUM  600+D3) 600-10 MG-MCG TABS Take 1 tablet by mouth in the morning.   Cholecalciferol  (VITAMIN D3) 1000 UNITS CAPS Take 1,000 Units by mouth in the morning.   cycloSPORINE  (RESTASIS ) 0.05 % ophthalmic emulsion Place 1 drop into both eyes 2 (two) times daily.  after applying a hot compress   denosumab  (PROLIA ) 60 MG/ML SOSY injection Inject 60 mg into the skin every 6 (six) months.   escitalopram (LEXAPRO) 5 MG tablet Take 20 mg by mouth in the morning.   hydrALAZINE (APRESOLINE) 10 MG tablet Take 10 mg by mouth every 8 (eight) hours as needed.   hydrOXYzine  (ATARAX ) 10 MG tablet Take 1 tablet (10 mg total) by mouth 2 (two) times daily as needed for up to 14 days.   metoprolol  succinate (TOPROL  XL) 25 MG 24 hr tablet Take 25 mg every morning   mirtazapine (REMERON) 7.5 MG tablet Take 15 mg by mouth at  bedtime.   nystatin  powder Apply 1 Application topically 2 (two) times daily. 100,000 units/g, topical, Twice a day, Apply Nystatin  powder to reddened areas BID (under L breast), if not healed after 7 days notify MD for further orders.   polyethylene glycol (MIRALAX  / GLYCOLAX ) 17 g packet Take 17 g by mouth in the morning.   pravastatin  (PRAVACHOL ) 40 MG tablet TAKE ONE TABLET BY MOUTH ONCE DAILY   senna (SENOKOT) 8.6 MG TABS tablet Take 2 tablets (17.2 mg total) by mouth at bedtime.   sodium chloride  (MURO 128) 5 % ophthalmic ointment Place 1 Application into both eyes at bedtime.   vitamin B-12 (CYANOCOBALAMIN ) 1000 MCG tablet Take 1 tablet (1,000 mcg total) by mouth daily.   Facility-Administered Encounter Medications as of 10/24/2023  Medication   [START ON 03/19/2024] denosumab  (PROLIA ) injection 60 mg    Review of Systems  Unable to perform ROS: Dementia    Immunization  History  Administered Date(s) Administered   Fluad Quad(high Dose 65+) 02/27/2020, 03/29/2022   H1N1 07/01/2008   Influenza Whole 04/10/2007, 04/01/2009, 03/26/2010, 03/25/2012   Influenza, High Dose Seasonal PF 04/16/2014, 04/09/2015, 04/03/2017, 04/19/2018, 02/28/2019, 03/08/2021, 04/18/2023   Influenza-Unspecified 06/27/2016, 04/03/2017   Moderna Sars-Covid-2 Vaccination 04/27/2022   PFIZER Comirnaty(Gray Top)Covid-19 Tri-Sucrose Vaccine 01/05/2021   PFIZER(Purple Top)SARS-COV-2 Vaccination 07/18/2019, 08/08/2019, 03/29/2020   Pneumococcal Conjugate-13 05/13/2014   Pneumococcal Polysaccharide-23 08/29/2016   Td 11/05/2003   Tdap 06/28/2011, 09/19/2011, 10/23/2020   Tetanus 10/23/2020   Unspecified SARS-COV-2 Vaccination 04/18/2023   Zoster Recombinant(Shingrix) 10/08/2016, 01/03/2017   Zoster, Live 02/24/2010   Pertinent  Health Maintenance Due  Topic Date Due   MAMMOGRAM  07/20/2024 (Originally 07/12/2023)   INFLUENZA VACCINE  01/26/2024   DEXA SCAN  Completed      04/25/2023    1:52 PM 05/23/2023     3:02 PM 06/19/2023    4:14 PM 08/15/2023    5:28 PM 10/10/2023    3:19 PM  Fall Risk  Falls in the past year? 1 1 0 1 1  Was there an injury with Fall? 1 1 0 0 1  Fall Risk Category Calculator 3 3 0 1 3  Patient at Risk for Falls Due to History of fall(s);Impaired balance/gait;Impaired mobility History of fall(s);Impaired balance/gait;Impaired mobility No Fall Risks History of fall(s);Impaired balance/gait History of fall(s);Impaired balance/gait;Impaired mobility  Fall risk Follow up Falls evaluation completed;Education provided;Falls prevention discussed Falls evaluation completed;Education provided;Falls prevention discussed Falls evaluation completed Falls evaluation completed;Education provided Falls evaluation completed;Education provided   Functional Status Survey:    Vitals:   10/24/23 1129  BP: 137/88  Pulse: 79  Resp: 18  Temp: (!) 97 F (36.1 C)  SpO2: 97%  Weight: 138 lb 9.6 oz (62.9 kg)  Height: 4\' 8"  (1.422 m)   Body mass index is 31.07 kg/m. Physical Exam Vitals reviewed.  Constitutional:      General: She is not in acute distress. HENT:     Head: Normocephalic.     Right Ear: There is no impacted cerumen.     Left Ear: There is no impacted cerumen.     Nose: Nose normal.     Mouth/Throat:     Mouth: Mucous membranes are moist.  Eyes:     General:        Right eye: No discharge.        Left eye: No discharge.  Cardiovascular:     Rate and Rhythm: Normal rate. Rhythm irregular.     Pulses: Normal pulses.     Heart sounds: Normal heart sounds.  Pulmonary:     Effort: Pulmonary effort is normal.     Breath sounds: Normal breath sounds.  Abdominal:     General: Bowel sounds are normal. There is no distension.     Palpations: Abdomen is soft.     Tenderness: There is no abdominal tenderness.  Musculoskeletal:     Cervical back: Neck supple.     Right lower leg: No edema.     Left lower leg: No edema.  Skin:    General: Skin is warm.     Capillary  Refill: Capillary refill takes less than 2 seconds.  Neurological:     General: No focal deficit present.     Mental Status: She is alert and oriented to person, place, and time.     Motor: Weakness present.     Gait: Gait abnormal.  Psychiatric:  Mood and Affect: Mood normal.     Labs reviewed: Recent Labs    12/04/22 1906 12/11/22 1937 02/23/23 0959 03/23/23 0000 08/24/23 0000 09/09/23 0650 09/09/23 0652  NA 141   < > 140 144 142 141 145  K 4.1   < > 3.7 4.7 4.1 3.7 4.3  CL 106   < > 101 106 108 107 107  CO2 26   < > 31 26* 24*  --  22  GLUCOSE 96   < > 76  --   --  88 88  BUN 28*   < > 15 18 26* 24* 22  CREATININE 1.06*   < > 0.94 0.8 0.7 0.90 0.98  CALCIUM  9.6   < > 9.8 9.8 9.3  --  9.9  MG 2.5*  --   --   --   --   --   --    < > = values in this interval not displayed.   Recent Labs    01/30/23 2123 02/23/23 0959 08/24/23 0000 09/09/23 0652  AST 28 24 24 30   ALT 18 15 20 11   ALKPHOS 55 50 98 68  BILITOT 1.2 1.3*  --  0.6  PROT 5.7* 5.8*  --  6.1*  ALBUMIN 3.5 3.5 3.6 3.6   Recent Labs    12/04/22 1906 12/11/22 1937 12/12/22 0859 01/30/23 2123 01/30/23 2133 02/23/23 0959 08/24/23 0000 09/09/23 0650 09/09/23 0652  WBC 8.5 9.1   < > 8.8  --  8.6 6.6  --  8.0  NEUTROABS 5.8 5.9  --   --   --  6.5  --   --   --   HGB 15.3* 17.6*   < > 14.9   < > 15.4* 13.7 16.7* 15.9*  HCT 45.3 51.4*   < > 43.8   < > 45.7 40 49.0* 47.2*  MCV 92.1 91.8   < > 90.7  --  90.9  --   --  92.9  PLT 191 217   < > 208  --  208 180  --  219   < > = values in this interval not displayed.   Lab Results  Component Value Date   TSH 2.16 08/24/2023   Lab Results  Component Value Date   HGBA1C 4.8 05/11/2021   Lab Results  Component Value Date   CHOL 122 08/24/2023   HDL 58 08/24/2023   LDLCALC 53 08/24/2023   LDLDIRECT 153.4 11/16/2009   TRIG 57 08/24/2023   CHOLHDL 2.5 05/11/2021    Significant Diagnostic Results in last 30 days:  No results  found.  Assessment/Plan 1. Essential hypertension (Primary) - controlled with metoprolol  and hydralazine  2. Pure hypercholesterolemia - LDL stable with pravastatin   3. Paroxysmal atrial fibrillation (HCC) - HR< 100 with metoprolol  - cont Eliquis  for clot prevention  4. Agitation due to dementia [facility]) - MMSE 19/30 02/2023 - verbally aggressive language to other residents> hydroxyzine  ineffective per nursing  - start Depakote BID for behaviors - cont skilled nursing - divalproex (DEPAKOTE) 125 MG DR tablet; Take 1 tablet (125 mg total) by mouth 2 (two) times daily. - hepatic panel in 2 weeks  5. Meningioma (HCC) - 08/29 CT head noted 3.1 x 2.7 cm meningioma - no surgical intervention per neurosurgery/neurology - continues to have balance issues and paresthesias  6. Reactive depression - no mood changes - cont Remeron and Lexapro  7. Senile osteopenia - cont Prolia   8. Chronic left shoulder pain -  on prednisone  taper  - nursing believes it has helped - cont tylenol      Family/ staff Communication: plan discussed with nursing  Labs/tests ordered:  hepatic panel in 2 weks

## 2023-10-26 DIAGNOSIS — R531 Weakness: Secondary | ICD-10-CM | POA: Diagnosis not present

## 2023-10-26 DIAGNOSIS — Z9181 History of falling: Secondary | ICD-10-CM | POA: Diagnosis not present

## 2023-10-26 DIAGNOSIS — R278 Other lack of coordination: Secondary | ICD-10-CM | POA: Diagnosis not present

## 2023-10-27 DIAGNOSIS — R531 Weakness: Secondary | ICD-10-CM | POA: Diagnosis not present

## 2023-10-27 DIAGNOSIS — Z9181 History of falling: Secondary | ICD-10-CM | POA: Diagnosis not present

## 2023-10-27 DIAGNOSIS — R278 Other lack of coordination: Secondary | ICD-10-CM | POA: Diagnosis not present

## 2023-10-30 DIAGNOSIS — Z9181 History of falling: Secondary | ICD-10-CM | POA: Diagnosis not present

## 2023-10-30 DIAGNOSIS — R531 Weakness: Secondary | ICD-10-CM | POA: Diagnosis not present

## 2023-10-30 DIAGNOSIS — R278 Other lack of coordination: Secondary | ICD-10-CM | POA: Diagnosis not present

## 2023-10-31 DIAGNOSIS — R531 Weakness: Secondary | ICD-10-CM | POA: Diagnosis not present

## 2023-10-31 DIAGNOSIS — R278 Other lack of coordination: Secondary | ICD-10-CM | POA: Diagnosis not present

## 2023-10-31 DIAGNOSIS — Z9181 History of falling: Secondary | ICD-10-CM | POA: Diagnosis not present

## 2023-11-02 DIAGNOSIS — Z9181 History of falling: Secondary | ICD-10-CM | POA: Diagnosis not present

## 2023-11-02 DIAGNOSIS — R278 Other lack of coordination: Secondary | ICD-10-CM | POA: Diagnosis not present

## 2023-11-02 DIAGNOSIS — R531 Weakness: Secondary | ICD-10-CM | POA: Diagnosis not present

## 2023-11-03 DIAGNOSIS — Z9181 History of falling: Secondary | ICD-10-CM | POA: Diagnosis not present

## 2023-11-03 DIAGNOSIS — R531 Weakness: Secondary | ICD-10-CM | POA: Diagnosis not present

## 2023-11-03 DIAGNOSIS — R278 Other lack of coordination: Secondary | ICD-10-CM | POA: Diagnosis not present

## 2023-11-07 DIAGNOSIS — R945 Abnormal results of liver function studies: Secondary | ICD-10-CM | POA: Diagnosis not present

## 2023-11-07 DIAGNOSIS — R278 Other lack of coordination: Secondary | ICD-10-CM | POA: Diagnosis not present

## 2023-11-07 DIAGNOSIS — R531 Weakness: Secondary | ICD-10-CM | POA: Diagnosis not present

## 2023-11-07 DIAGNOSIS — Z9181 History of falling: Secondary | ICD-10-CM | POA: Diagnosis not present

## 2023-11-07 LAB — HEPATIC FUNCTION PANEL
ALT: 24 U/L (ref 7–35)
AST: 34 (ref 13–35)
Alkaline Phosphatase: 97 (ref 25–125)
Bilirubin, Direct: 0.51 — AB (ref 0.01–0.4)
Bilirubin, Total: 1

## 2023-11-07 LAB — COMPREHENSIVE METABOLIC PANEL WITH GFR: Albumin: 3.9 (ref 3.5–5.0)

## 2023-11-10 DIAGNOSIS — R278 Other lack of coordination: Secondary | ICD-10-CM | POA: Diagnosis not present

## 2023-11-10 DIAGNOSIS — R531 Weakness: Secondary | ICD-10-CM | POA: Diagnosis not present

## 2023-11-10 DIAGNOSIS — Z9181 History of falling: Secondary | ICD-10-CM | POA: Diagnosis not present

## 2023-11-13 DIAGNOSIS — R278 Other lack of coordination: Secondary | ICD-10-CM | POA: Diagnosis not present

## 2023-11-13 DIAGNOSIS — R531 Weakness: Secondary | ICD-10-CM | POA: Diagnosis not present

## 2023-11-13 DIAGNOSIS — Z9181 History of falling: Secondary | ICD-10-CM | POA: Diagnosis not present

## 2023-11-14 ENCOUNTER — Non-Acute Institutional Stay (SKILLED_NURSING_FACILITY): Payer: Self-pay | Admitting: Orthopedic Surgery

## 2023-11-14 ENCOUNTER — Encounter: Payer: Self-pay | Admitting: Orthopedic Surgery

## 2023-11-14 DIAGNOSIS — M8580 Other specified disorders of bone density and structure, unspecified site: Secondary | ICD-10-CM

## 2023-11-14 DIAGNOSIS — F03911 Unspecified dementia, unspecified severity, with agitation: Secondary | ICD-10-CM

## 2023-11-14 DIAGNOSIS — D329 Benign neoplasm of meninges, unspecified: Secondary | ICD-10-CM

## 2023-11-14 DIAGNOSIS — E7800 Pure hypercholesterolemia, unspecified: Secondary | ICD-10-CM

## 2023-11-14 DIAGNOSIS — G8929 Other chronic pain: Secondary | ICD-10-CM

## 2023-11-14 DIAGNOSIS — Z9181 History of falling: Secondary | ICD-10-CM | POA: Diagnosis not present

## 2023-11-14 DIAGNOSIS — F329 Major depressive disorder, single episode, unspecified: Secondary | ICD-10-CM

## 2023-11-14 DIAGNOSIS — I10 Essential (primary) hypertension: Secondary | ICD-10-CM

## 2023-11-14 DIAGNOSIS — R278 Other lack of coordination: Secondary | ICD-10-CM | POA: Diagnosis not present

## 2023-11-14 DIAGNOSIS — I480 Paroxysmal atrial fibrillation: Secondary | ICD-10-CM

## 2023-11-14 DIAGNOSIS — M25512 Pain in left shoulder: Secondary | ICD-10-CM | POA: Diagnosis not present

## 2023-11-14 DIAGNOSIS — R531 Weakness: Secondary | ICD-10-CM | POA: Diagnosis not present

## 2023-11-14 NOTE — Progress Notes (Signed)
 [facility] Room Number: 127-A Place of Service:  SNF [phone]) Provider:  Ulyses Gandy, NP  PCP: Marguerite Shiley, MD  Patient Care Team: Marguerite Shiley, MD as PCP - General (Internal Medicine) Croitoru, Karyl Paget, MD as PCP - Cardiology (Cardiology) Denman Fischer, MD as Consulting Physician (Dermatology) Corie Diamond, MD as Consulting Physician (Ophthalmology) Merriam Abbey, DO as Consulting Physician (Neurology) Debbie Fails Laura Polio, NP as Nurse Practitioner (Hematology and Oncology)  Extended Emergency Contact Information Primary Emergency Contact: Ray,Ruth  [place] Home Phone: [phone] Mobile Phone: [phone] Relation: Daughter Secondary Emergency Contact: Tolley,Rich  [place] Home Phone: [phone] Relation: Son  Code Status:  DNR Goals of care: Advanced Directive information    11/14/2023   10:17 AM  Advanced Directives  Does Patient Have a Medical Advance Directive? Yes  Type of Estate agent of [place];Living will;Out of facility DNR (pink MOST or yellow form)  Does patient want to make changes to medical advance directive? No - Patient declined  Copy of Healthcare Power of Attorney in Chart? Yes - validated most recent copy scanned in chart (See row information)     Chief Complaint  Patient presents with   Medical Management of Chronic Issues    Routine Visit. Discuss the need for Covid Booster.     HPI:  Pt is a 89 y.o. female seen today for medical management of chronic diseases.    She currently resides on the skilled nursing unit (admitted 02/23/2023) at [facility] due to weakness. PMH: PAF, HTN, HLD, left breast cancer, meningioma, osteopenia, and gait abnormality.      HTN- BUN/creat 22/0.98 09/09/2023, remains on low dose metoprolol  and hydralazine for SBP> 175 HLD- total 122/ LDL 53 08/24/2023, remains on pravastatin  PAF-  h/o cardioversion,  off Cardizem  due to worsening bradycardia, remains on metoprolol  for rate control and low dose Eliquis  for clot prevention  Dementia/abnormal gait/agitation- 08/29 CT head noted periventricular and deep white matter hypodensity, MMSE 19/30 02/27/2023, intermittent agitation> hydroxyzine  ineffective, 04/29 started on Depakote , AST/ALT 34/24 11/07/2023 Meningioma-  began as leg paresthesias last year, 08/29 CT head noted 3.1 x 2.7 cm meningioma> increased in size over years, no surgical intervention per neurosurgery/neurology Reactive depression- Na+ 145 09/09/2023, remains on Lexapro and Remeron Senile osteopenia- DEXA 06/2021, t score -1.1, remains on Prolia  injections> scheduled 09/14/2023 Chronic left shoulder pain- 04/21 prednisone  10 mg x 7 days, 04/22 xray negative for fracture or dislocation, remains on tylenol  1000 mg TID   Recent blood pressures:  05/19- 116/71  05/18- 154/92, 140/94  05/11- 142/74  Recent weights:  05/01- 135.4 lbs  04/01- 138.6 lbs  03/01- 139.8 lbs   Past Medical History:  Diagnosis Date   Allergy    Ankle fracture, left    Atrial fibrillation (HCC)    Per [facility] New Patient Packet    Breast cancer (HCC) 1115/16   left    Bursitis of right shoulder    Cataract    Family history of cancer    Fracture of right wrist    GERD (gastroesophageal reflux disease)    Hyperlipidemia    Hypertension    Lumbar radiculitis    Per [facility] New Patient Packet    Meningioma [facility])    Per [facility] New Patient Packet    Skin cancer 2000   melanoma and basal cell   Stress fracture    Right Heel, Per [facility] New Patient Packet  TIA (transient ischemic attack) 2022   Vaso vagal episode    during preparation for colonoscopy   Past Surgical History:  Procedure Laterality Date   ABDOMINAL HYSTERECTOMY  1988   TAH/BSO--FIBROIDS   APPENDECTOMY     BREAST LUMPECTOMY     B/L--[facility]   BREAST LUMPECTOMY WITH RADIOACTIVE SEED AND SENTINEL LYMPH NODE BIOPSY Left 06/11/2015   Procedure:  LEFT BREAST LUMPECTOMY WITH RADIOACTIVE SEED AND LEFT SENTINEL LYMPH NODE MAPPING;  Surgeon: Sim Dryer, MD;  Location: [facility] OR;  Service: General;  Laterality: Left;   CARDIOVERSION N/A 01/11/2022   Procedure: CARDIOVERSION;  Surgeon: Sheryle Donning, MD;  Location: [facility] ENDOSCOPY;  Service: Cardiovascular;  Laterality: N/A;   CATARACT EXTRACTION  2015   COLONOSCOPY  2010   DG  BONE DENSITY ([facility] HX)     EYE SURGERY Bilateral    cataracts   fiberadenoma Bilateral 1978, 1980   GANGLION CYST EXCISION     L  hand   Lipiflow procedure      Allergies  Allergen Reactions   Sulfa Antibiotics Other (See Comments)    As a child, became "rigid as a stick and not responsive"   Tape Other (See Comments)    Blisters, Please use "paper" tape only for short periods of time   Wound Dressing Adhesive Other (See Comments)    Blisters, Please use "paper" tape only for short periods of time, Blisters, Please use "paper" tape only for short periods of time   Glucosamine Forte [Nutritional Supplements] Rash   Latex Rash    Allergies as of 11/14/2023       Reactions   Sulfa Antibiotics Other (See Comments)   As a child, became "rigid as a stick and not responsive"   Tape Other (See Comments)   Blisters, Please use "paper" tape only for short periods of time   Wound Dressing Adhesive Other (See Comments)   Blisters, Please use "paper" tape only for short periods of time, Blisters, Please use "paper" tape only for short periods of time   Glucosamine Forte [nutritional Supplements] Rash   Latex Rash        Medication List        Accurate as of Nov 14, 2023 10:17 AM. If you have any questions, ask your nurse or doctor.          acetaminophen  500 MG tablet Commonly known as: TYLENOL  Take 1,000 mg by mouth 3 (three) times daily.   apixaban  2.5 MG Tabs tablet Commonly known as: ELIQUIS  Take 1 tablet (2.5 mg total) by mouth 2 (two) times daily.   Calcium  600+D3 600-10 MG-MCG  Tabs Generic drug: Calcium  Carb-Cholecalciferol  Take 1 tablet by mouth in the morning.   cyanocobalamin  1000 MCG tablet Commonly known as: VITAMIN B12 Take 1 tablet (1,000 mcg total) by mouth daily.   cycloSPORINE  0.05 % ophthalmic emulsion Commonly known as: RESTASIS  Place 1 drop into both eyes 2 (two) times daily.  after applying a hot compress   denosumab  60 MG/ML Sosy injection Commonly known as: PROLIA  Inject 60 mg into the skin every 6 (six) months.   divalproex  125 MG DR tablet Commonly known as: Depakote  Take 1 tablet (125 mg total) by mouth 2 (two) times daily.   escitalopram 5 MG tablet Commonly known as: LEXAPRO Take 20 mg by mouth in the morning.   hydrALAZINE 10 MG tablet Commonly known as: APRESOLINE Take 10 mg by mouth every 8 (eight) hours as needed.   hydrOXYzine  10 MG tablet Commonly  known as: ATARAX  Take 10 mg by mouth as needed.   metoprolol  succinate 25 MG 24 hr tablet Commonly known as: Toprol  XL Take 25 mg every morning   mirtazapine 7.5 MG tablet Commonly known as: REMERON Take 15 mg by mouth at bedtime.   mirtazapine 15 MG tablet Commonly known as: REMERON Take 15 mg by mouth at bedtime.   nystatin  powder Apply 1 Application topically 2 (two) times daily. 100,000 units/g, topical, Twice a day, Apply Nystatin  powder to reddened areas BID (under L breast), if not healed after 7 days notify MD for further orders.   polyethylene glycol 17 g packet Commonly known as: MIRALAX  / GLYCOLAX  Take 17 g by mouth in the morning.   pravastatin  40 MG tablet Commonly known as: PRAVACHOL  TAKE ONE TABLET BY MOUTH ONCE DAILY   senna 8.6 MG Tabs tablet Commonly known as: SENOKOT Take 2 tablets (17.2 mg total) by mouth at bedtime.   sodium chloride  5 % ophthalmic ointment Commonly known as: MURO 128 Place 1 Application into both eyes at bedtime.   Vitamin D3 25 MCG (1000 UT) Caps Take 1,000 Units by mouth in the morning.        Review of  Systems  Unable to perform ROS: Dementia    Immunization History  Administered Date(s) Administered   Fluad Quad(high Dose 65+) 02/27/2020, 03/29/2022   H1N1 07/01/2008   Influenza Whole 04/10/2007, 04/01/2009, 03/26/2010, 03/25/2012   Influenza, High Dose Seasonal PF 04/16/2014, 04/09/2015, 04/03/2017, 04/19/2018, 02/28/2019, 03/08/2021, 04/18/2023   Influenza-Unspecified 06/27/2016, 04/03/2017   Moderna Sars-Covid-2 Vaccination 04/27/2022   PFIZER Comirnaty(Gray Top)Covid-19 Tri-Sucrose Vaccine 01/05/2021   PFIZER(Purple Top)SARS-COV-2 Vaccination 07/18/2019, 08/08/2019, 03/29/2020   Pneumococcal Conjugate-13 05/13/2014   Pneumococcal Polysaccharide-23 08/29/2016   Td 11/05/2003   Tdap 06/28/2011, 09/19/2011, 10/23/2020   Tetanus 10/23/2020   Unspecified SARS-COV-2 Vaccination 04/18/2023   Zoster Recombinant(Shingrix) 10/08/2016, 01/03/2017   Zoster, Live 02/24/2010   Pertinent  Health Maintenance Due  Topic Date Due   MAMMOGRAM  07/20/2024 (Originally 07/12/2023)   INFLUENZA VACCINE  01/26/2024   DEXA SCAN  Completed      04/25/2023    1:52 PM 05/23/2023    3:02 PM 06/19/2023    4:14 PM 08/15/2023    5:28 PM 10/10/2023    3:19 PM  Fall Risk  Falls in the past year? 1 1 0 1 1  Was there an injury with Fall? 1 1 0 0 1  Fall Risk Category Calculator 3 3 0 1 3  Patient at Risk for Falls Due to History of fall(s);Impaired balance/gait;Impaired mobility History of fall(s);Impaired balance/gait;Impaired mobility No Fall Risks History of fall(s);Impaired balance/gait History of fall(s);Impaired balance/gait;Impaired mobility  Fall risk Follow up Falls evaluation completed;Education provided;Falls prevention discussed Falls evaluation completed;Education provided;Falls prevention discussed Falls evaluation completed Falls evaluation completed;Education provided Falls evaluation completed;Education provided   Functional Status Survey:    Vitals:   11/14/23 1002  BP: 116/71   Pulse: 70  Resp: 17  Temp: 97.9 F (36.6 C)  SpO2: 99%  Weight: 135 lb 6.4 oz (61.4 kg)  Height: 4\' 8"  (1.422 m)   Body mass index is 30.36 kg/m. Physical Exam Vitals reviewed.  Constitutional:      General: She is not in acute distress. HENT:     Head: Normocephalic.     Right Ear: There is no impacted cerumen.     Left Ear: There is no impacted cerumen.     Nose: Nose normal.     Mouth/Throat:  Mouth: Mucous membranes are moist.  Eyes:     General:        Right eye: No discharge.        Left eye: No discharge.     Pupils: Pupils are equal, round, and reactive to light.  Cardiovascular:     Rate and Rhythm: Normal rate. Rhythm irregular.     Pulses: Normal pulses.     Heart sounds: Normal heart sounds.  Pulmonary:     Effort: Pulmonary effort is normal. No respiratory distress.     Breath sounds: Normal breath sounds. No wheezing or rales.  Abdominal:     General: Bowel sounds are normal.     Palpations: Abdomen is soft.  Musculoskeletal:     Cervical back: Neck supple.     Right lower leg: No edema.     Left lower leg: No edema.  Skin:    General: Skin is warm.     Capillary Refill: Capillary refill takes less than 2 seconds.  Neurological:     General: No focal deficit present.     Mental Status: She is alert. Mental status is at baseline.     Motor: Weakness present.     Gait: Gait abnormal.  Psychiatric:        Mood and Affect: Mood normal.     Labs reviewed: Recent Labs    12/04/22 1906 12/11/22 1937 02/23/23 0959 03/23/23 0000 08/24/23 0000 09/09/23 0650 09/09/23 0652  NA 141   < > 140 144 142 141 145  K 4.1   < > 3.7 4.7 4.1 3.7 4.3  CL 106   < > 101 106 108 107 107  CO2 26   < > 31 26* 24*  --  22  GLUCOSE 96   < > 76  --   --  88 88  BUN 28*   < > 15 18 26* 24* 22  CREATININE 1.06*   < > 0.94 0.8 0.7 0.90 0.98  CALCIUM  9.6   < > 9.8 9.8 9.3  --  9.9  MG 2.5*  --   --   --   --   --   --    < > = values in this interval not  displayed.   Recent Labs    01/30/23 2123 02/23/23 0959 08/24/23 0000 09/09/23 0652  AST 28 24 24 30   ALT 18 15 20 11   ALKPHOS 55 50 98 68  BILITOT 1.2 1.3*  --  0.6  PROT 5.7* 5.8*  --  6.1*  ALBUMIN 3.5 3.5 3.6 3.6   Recent Labs    12/04/22 1906 12/11/22 1937 12/12/22 0859 01/30/23 2123 01/30/23 2133 02/23/23 0959 08/24/23 0000 09/09/23 0650 09/09/23 0652  WBC 8.5 9.1   < > 8.8  --  8.6 6.6  --  8.0  NEUTROABS 5.8 5.9  --   --   --  6.5  --   --   --   HGB 15.3* 17.6*   < > 14.9   < > 15.4* 13.7 16.7* 15.9*  HCT 45.3 51.4*   < > 43.8   < > 45.7 40 49.0* 47.2*  MCV 92.1 91.8   < > 90.7  --  90.9  --   --  92.9  PLT 191 217   < > 208  --  208 180  --  219   < > = values in this interval not displayed.   Lab Results  Component Value Date  TSH 2.16 08/24/2023   Lab Results  Component Value Date   HGBA1C 4.8 05/11/2021   Lab Results  Component Value Date   CHOL 122 08/24/2023   HDL 58 08/24/2023   LDLCALC 53 08/24/2023   LDLDIRECT 153.4 11/16/2009   TRIG 57 08/24/2023   CHOLHDL 2.5 05/11/2021    Significant Diagnostic Results in last 30 days:  No results found.  Assessment/Plan 1. Essential hypertension (Primary) - controlled with metoprolol  and hydralazine prn  2. Pure hypercholesterolemia - cont pravastatin   3. Paroxysmal atrial fibrillation (HCC) - HR< 100 with metoprolol  - cont Eliquis  for clot prevention  4. Agitation due to dementia [facility]) - MMSE 19/30 02/2023 - verbally aggressive language last month - unsuccessful trial hydroxyzine   - behaviors improved with Depakote  (started 04/29) > rechecked AST/ALT stable  5. Meningioma (HCC) -  08/29 CT head noted 3.1 x 2.7 cm meningioma - no surgical intervention per neurosurgery/neurology - continues to have balance issues and paresthesias  6. Reactive depression - no mood changes - cont Remeron and Lexapro  7. Senile osteopenia - cont Prolia   8. Chronic left shoulder pain - xrays  negative for fracture or dislocation - 04/21 prednisone  taper x 7 days - cont tylenol      Family/ staff Communication: plan discussed with patient and nurse  Labs/tests ordered: none

## 2023-11-15 DIAGNOSIS — Z9181 History of falling: Secondary | ICD-10-CM | POA: Diagnosis not present

## 2023-11-15 DIAGNOSIS — R278 Other lack of coordination: Secondary | ICD-10-CM | POA: Diagnosis not present

## 2023-11-15 DIAGNOSIS — R531 Weakness: Secondary | ICD-10-CM | POA: Diagnosis not present

## 2023-11-20 DIAGNOSIS — R278 Other lack of coordination: Secondary | ICD-10-CM | POA: Diagnosis not present

## 2023-11-20 DIAGNOSIS — Z9181 History of falling: Secondary | ICD-10-CM | POA: Diagnosis not present

## 2023-11-20 DIAGNOSIS — R531 Weakness: Secondary | ICD-10-CM | POA: Diagnosis not present

## 2023-11-23 DIAGNOSIS — Z9181 History of falling: Secondary | ICD-10-CM | POA: Diagnosis not present

## 2023-11-23 DIAGNOSIS — R278 Other lack of coordination: Secondary | ICD-10-CM | POA: Diagnosis not present

## 2023-11-23 DIAGNOSIS — R531 Weakness: Secondary | ICD-10-CM | POA: Diagnosis not present

## 2023-11-24 DIAGNOSIS — R531 Weakness: Secondary | ICD-10-CM | POA: Diagnosis not present

## 2023-11-24 DIAGNOSIS — Z9181 History of falling: Secondary | ICD-10-CM | POA: Diagnosis not present

## 2023-11-24 DIAGNOSIS — R278 Other lack of coordination: Secondary | ICD-10-CM | POA: Diagnosis not present

## 2023-11-27 ENCOUNTER — Encounter: Payer: Self-pay | Admitting: Adult Health

## 2023-11-27 ENCOUNTER — Non-Acute Institutional Stay (SKILLED_NURSING_FACILITY): Payer: Self-pay | Admitting: Adult Health

## 2023-11-27 DIAGNOSIS — M545 Low back pain, unspecified: Secondary | ICD-10-CM

## 2023-11-27 DIAGNOSIS — Z9181 History of falling: Secondary | ICD-10-CM | POA: Diagnosis not present

## 2023-11-27 DIAGNOSIS — I10 Essential (primary) hypertension: Secondary | ICD-10-CM | POA: Diagnosis not present

## 2023-11-27 DIAGNOSIS — J069 Acute upper respiratory infection, unspecified: Secondary | ICD-10-CM

## 2023-11-27 DIAGNOSIS — G8929 Other chronic pain: Secondary | ICD-10-CM | POA: Diagnosis not present

## 2023-11-27 DIAGNOSIS — R531 Weakness: Secondary | ICD-10-CM | POA: Diagnosis not present

## 2023-11-27 DIAGNOSIS — R278 Other lack of coordination: Secondary | ICD-10-CM | POA: Diagnosis not present

## 2023-11-27 NOTE — Progress Notes (Signed)
 Location:  [facility] Room Number: 127 [id number] Place of Service:  SNF [phone]) Provider: Raylene Calamity, NP    Patient Care Team: Marguerite Shiley, MD as PCP - General (Internal Medicine) Croitoru, Karyl Paget, MD as PCP - Cardiology (Cardiology) Denman Fischer, MD as Consulting Physician (Dermatology) Corie Diamond, MD as Consulting Physician (Ophthalmology) Merriam Abbey, DO as Consulting Physician (Neurology) Debbie Fails Laura Polio, NP as Nurse Practitioner (Hematology and Oncology)  Extended Emergency Contact Information Primary Emergency Contact: Ray,Ruth  [place] Home Phone: [phone] Mobile Phone: [phone] Relation: Daughter Secondary Emergency Contact: Klump,Rich  [place] Home Phone: [phone] Relation: Son  Code Status: DNR Goals of care: Advanced Directive information    11/14/2023   10:17 AM  Advanced Directives  Does Patient Have a Medical Advance Directive? Yes  Type of Estate agent of [place];Living will;Out of facility DNR (pink MOST or yellow form)  Does patient want to make changes to medical advance directive? No - Patient declined  Copy of Healthcare Power of Attorney in Chart? Yes - validated most recent copy scanned in chart (See row information)     Chief Complaint  Patient presents with   Cough    HPI:  The patient presents with a runny nose, cough, fatigue, and lower back pain.  She has been experiencing a runny nose, mild cough, fatigue, and feeling sleepy since yesterday. No trouble breathing or coughing up phlegm. She also reports body aches and a sore throat. She is unsure if she has been around anyone who was sick recently.  She has a long-term history of chronic low back pain, which does not radiate and has not been associated with any changes in bowel or bladder habits. The pain does not worsen with walking. She has been taking scheduled Tylenol  daily  for pain management but cannot take NSAIDs due to bleeding risk. She also has osteoporosis, which limits the use of prednisone . She mentions a history of weakness in her left leg but states she is walking okay.  CT of the lumbar spine March 2025 Significant degenerative changes seen on CT scan of the lumbar spine at the L4-5 and L5-S1 area. Also disc bulging at the L3-4 area. Spinal stenosis noted.  Past Medical History:  Diagnosis Date   Allergy    Ankle fracture, left    Atrial fibrillation (HCC)    Per [facility] New Patient Packet    Breast cancer (HCC) 1115/16   left    Bursitis of right shoulder    Cataract    Family history of cancer    Fracture of right wrist    GERD (gastroesophageal reflux disease)    Hyperlipidemia    Hypertension    Lumbar radiculitis    Per [facility] New Patient Packet    Meningioma [facility])    Per [facility] New Patient Packet    Skin cancer 2000   melanoma and basal cell   Stress fracture    Right Heel, Per [facility] New Patient Packet    TIA (transient ischemic attack) 2022   Vaso vagal episode    during preparation for colonoscopy   Past Surgical History:  Procedure Laterality Date   ABDOMINAL HYSTERECTOMY  1988   TAH/BSO--FIBROIDS   APPENDECTOMY     BREAST LUMPECTOMY     B/L--[facility]   BREAST LUMPECTOMY WITH RADIOACTIVE SEED AND SENTINEL LYMPH NODE BIOPSY Left 06/11/2015   Procedure: LEFT BREAST LUMPECTOMY WITH RADIOACTIVE SEED AND LEFT SENTINEL LYMPH NODE  MAPPING;  Surgeon: Sim Dryer, MD;  Location: [facility] OR;  Service: General;  Laterality: Left;   CARDIOVERSION N/A 01/11/2022   Procedure: CARDIOVERSION;  Surgeon: Sheryle Donning, MD;  Location: [facility] ENDOSCOPY;  Service: Cardiovascular;  Laterality: N/A;   CATARACT EXTRACTION  2015   COLONOSCOPY  2010   DG  BONE DENSITY ([facility] HX)     EYE SURGERY Bilateral    cataracts   fiberadenoma Bilateral 1978, 1980   GANGLION CYST EXCISION     L  hand   Lipiflow procedure      Allergies  Allergen Reactions   Sulfa  Antibiotics Other (See Comments)    As a child, became "rigid as a stick and not responsive"   Tape Other (See Comments)    Blisters, Please use "paper" tape only for short periods of time   Wound Dressing Adhesive Other (See Comments)    Blisters, Please use "paper" tape only for short periods of time, Blisters, Please use "paper" tape only for short periods of time   Glucosamine Forte [Nutritional Supplements] Rash   Latex Rash    Outpatient Encounter Medications as of 11/27/2023  Medication Sig   acetaminophen  (TYLENOL ) 500 MG tablet Take 1,000 mg by mouth 3 (three) times daily.   apixaban  (ELIQUIS ) 2.5 MG TABS tablet Take 1 tablet (2.5 mg total) by mouth 2 (two) times daily.   Calcium  Carb-Cholecalciferol  (CALCIUM  600+D3) 600-10 MG-MCG TABS Take 1 tablet by mouth in the morning.   Cholecalciferol  (VITAMIN D3) 1000 UNITS CAPS Take 1,000 Units by mouth in the morning.   cycloSPORINE  (RESTASIS ) 0.05 % ophthalmic emulsion Place 1 drop into both eyes 2 (two) times daily.  after applying a hot compress   denosumab  (PROLIA ) 60 MG/ML SOSY injection Inject 60 mg into the skin every 6 (six) months.   divalproex  (DEPAKOTE ) 125 MG DR tablet Take 1 tablet (125 mg total) by mouth 2 (two) times daily.   escitalopram (LEXAPRO) 5 MG tablet Take 20 mg by mouth in the morning.   hydrALAZINE (APRESOLINE) 10 MG tablet Take 10 mg by mouth every 8 (eight) hours as needed.   metoprolol  succinate (TOPROL  XL) 25 MG 24 hr tablet Take 25 mg every morning   mirtazapine (REMERON) 15 MG tablet Take 15 mg by mouth at bedtime.   polyethylene glycol (MIRALAX  / GLYCOLAX ) 17 g packet Take 17 g by mouth in the morning.   pravastatin  (PRAVACHOL ) 40 MG tablet TAKE ONE TABLET BY MOUTH ONCE DAILY   senna (SENOKOT) 8.6 MG TABS tablet Take 2 tablets (17.2 mg total) by mouth at bedtime.   sodium chloride  (MURO 128) 5 % ophthalmic ointment Place 1 Application into both eyes at bedtime.   vitamin B-12 (CYANOCOBALAMIN ) 1000 MCG  tablet Take 1 tablet (1,000 mcg total) by mouth daily.   Facility-Administered Encounter Medications as of 11/27/2023  Medication   [START ON 03/19/2024] denosumab  (PROLIA ) injection 60 mg    Review of Systems  Constitutional:  Positive for fatigue. Negative for activity change, appetite change, chills, diaphoresis and fever.  HENT:  Positive for congestion and rhinorrhea. Negative for ear pain, facial swelling and hearing loss.   Respiratory:  Positive for cough. Negative for shortness of breath and wheezing.   Cardiovascular:  Negative for chest pain and leg swelling.  Gastrointestinal:  Negative for abdominal distention, abdominal pain, constipation, diarrhea, nausea and vomiting.  Genitourinary:  Negative for difficulty urinating, dysuria and urgency.  Musculoskeletal:  Positive for back pain and gait problem. Negative for myalgias  and neck pain.  Skin:  Negative for rash.  Neurological:  Negative for dizziness and weakness.  Psychiatric/Behavioral:  Negative for confusion.     Immunization History  Administered Date(s) Administered   Fluad Quad(high Dose 65+) 02/27/2020, 03/29/2022   H1N1 07/01/2008   Influenza Whole 04/10/2007, 04/01/2009, 03/26/2010, 03/25/2012   Influenza, High Dose Seasonal PF 04/16/2014, 04/09/2015, 04/03/2017, 04/19/2018, 02/28/2019, 03/08/2021, 04/18/2023   Influenza-Unspecified 06/27/2016, 04/03/2017   Moderna Sars-Covid-2 Vaccination 04/27/2022   PFIZER Comirnaty(Gray Top)Covid-19 Tri-Sucrose Vaccine 01/05/2021   PFIZER(Purple Top)SARS-COV-2 Vaccination 07/18/2019, 08/08/2019, 03/29/2020   Pneumococcal Conjugate-13 05/13/2014   Pneumococcal Polysaccharide-23 08/29/2016   Td 11/05/2003   Tdap 06/28/2011, 09/19/2011, 10/23/2020   Tetanus 10/23/2020   Unspecified SARS-COV-2 Vaccination 04/18/2023   Zoster Recombinant(Shingrix) 10/08/2016, 01/03/2017   Zoster, Live 02/24/2010   Pertinent  Health Maintenance Due  Topic Date Due   MAMMOGRAM  07/20/2024  (Originally 07/12/2023)   INFLUENZA VACCINE  01/26/2024   DEXA SCAN  Completed      04/25/2023    1:52 PM 05/23/2023    3:02 PM 06/19/2023    4:14 PM 08/15/2023    5:28 PM 10/10/2023    3:19 PM  Fall Risk  Falls in the past year? 1 1 0 1 1  Was there an injury with Fall? 1 1 0 0 1  Fall Risk Category Calculator 3 3 0 1 3  Patient at Risk for Falls Due to History of fall(s);Impaired balance/gait;Impaired mobility History of fall(s);Impaired balance/gait;Impaired mobility No Fall Risks History of fall(s);Impaired balance/gait History of fall(s);Impaired balance/gait;Impaired mobility  Fall risk Follow up Falls evaluation completed;Education provided;Falls prevention discussed Falls evaluation completed;Education provided;Falls prevention discussed Falls evaluation completed Falls evaluation completed;Education provided Falls evaluation completed;Education provided   Functional Status Survey:    Vitals:   11/27/23 1058 11/27/23 1146  BP: (!) 140/88 (!) 169/89  Pulse: 67   Resp: 18   Temp: (!) 97 F (36.1 C)   SpO2: 97%   Weight: 137 lb 4.8 oz (62.3 kg)   Height: 4\' 8"  (1.422 m)    Body mass index is 30.78 kg/m. Physical Exam Constitutional:      General: She is not in acute distress.    Appearance: She is not diaphoretic.  HENT:     Head: Normocephalic and atraumatic.     Right Ear: Tympanic membrane normal.     Left Ear: Tympanic membrane normal.     Nose: Congestion present.     Comments: Erythema to nasal passages with clear drainage.  Eyes:     Conjunctiva/sclera: Conjunctivae normal.     Pupils: Pupils are equal, round, and reactive to light.  Neck:     Vascular: No JVD.  Cardiovascular:     Rate and Rhythm: Normal rate and regular rhythm.     Heart sounds: No murmur heard. Pulmonary:     Effort: Pulmonary effort is normal. No respiratory distress.     Breath sounds: Normal breath sounds. No wheezing.  Abdominal:     General: Abdomen is flat. Bowel sounds are  normal.     Palpations: Abdomen is soft.  Musculoskeletal:        General: Tenderness (Lumbar area, no deformity or brusing.) present.     Right lower leg: No edema.     Left lower leg: No edema.     Comments: Strength RLE 4/5, LLE 3/5 Neg straight leg raise  Skin:    General: Skin is warm and dry.  Neurological:     General: No  focal deficit present.     Mental Status: She is alert.     Labs reviewed: Recent Labs    12/04/22 1906 12/11/22 1937 02/23/23 0959 03/23/23 0000 08/24/23 0000 09/09/23 0650 09/09/23 0652  NA 141   < > 140 144 142 141 145  K 4.1   < > 3.7 4.7 4.1 3.7 4.3  CL 106   < > 101 106 108 107 107  CO2 26   < > 31 26* 24*  --  22  GLUCOSE 96   < > 76  --   --  88 88  BUN 28*   < > 15 18 26* 24* 22  CREATININE 1.06*   < > 0.94 0.8 0.7 0.90 0.98  CALCIUM  9.6   < > 9.8 9.8 9.3  --  9.9  MG 2.5*  --   --   --   --   --   --    < > = values in this interval not displayed.   Recent Labs    01/30/23 2123 02/23/23 0959 08/24/23 0000 09/09/23 0652  AST 28 24 24 30   ALT 18 15 20 11   ALKPHOS 55 50 98 68  BILITOT 1.2 1.3*  --  0.6  PROT 5.7* 5.8*  --  6.1*  ALBUMIN 3.5 3.5 3.6 3.6   Recent Labs    12/04/22 1906 12/11/22 1937 12/12/22 0859 01/30/23 2123 01/30/23 2133 02/23/23 0959 08/24/23 0000 09/09/23 0650 09/09/23 0652  WBC 8.5 9.1   < > 8.8  --  8.6 6.6  --  8.0  NEUTROABS 5.8 5.9  --   --   --  6.5  --   --   --   HGB 15.3* 17.6*   < > 14.9   < > 15.4* 13.7 16.7* 15.9*  HCT 45.3 51.4*   < > 43.8   < > 45.7 40 49.0* 47.2*  MCV 92.1 91.8   < > 90.7  --  90.9  --   --  92.9  PLT 191 217   < > 208  --  208 180  --  219   < > = values in this interval not displayed.   Lab Results  Component Value Date   TSH 2.16 08/24/2023   Lab Results  Component Value Date   HGBA1C 4.8 05/11/2021   Lab Results  Component Value Date   CHOL 122 08/24/2023   HDL 58 08/24/2023   LDLCALC 53 08/24/2023   LDLDIRECT 153.4 11/16/2009   TRIG 57 08/24/2023    CHOLHDL 2.5 05/11/2021    Significant Diagnostic Results in last 30 days:  No results found.  Assessment/Plan  Viral upper respiratory infection Symptoms suggest viral URI. Lungs clear, oxygen saturation adequate. Influenza and COVID-19 swabs are negative.  - Monitor vs q shift x 48 hrs - Advise rest and increased fluid intake.  Chronic low back pain Significant degenerative changes seen on CT scan of the lumbar spine at the L4-5 and L5-S1 area. Also disc bulging at the L3-4 area. Spinal stenosis noted. No radiculopathy or bowel/bladder changes. Acetaminophen  scheduled. NSAIDs and frequent prednisone  avoided. - Review current pain management regimen. She is a fall risk. - Prescribe lidocaine  patch for lower back, apply morning, remove evening. - Recommend supportive chair with feet elevated.  HTN BP elevated Isolated event Other bps reviewed are acceptable Fall risk Hydralazine prn is ordered Working on pain management

## 2023-12-01 DIAGNOSIS — R531 Weakness: Secondary | ICD-10-CM | POA: Diagnosis not present

## 2023-12-01 DIAGNOSIS — Z9181 History of falling: Secondary | ICD-10-CM | POA: Diagnosis not present

## 2023-12-01 DIAGNOSIS — R278 Other lack of coordination: Secondary | ICD-10-CM | POA: Diagnosis not present

## 2023-12-02 DIAGNOSIS — R531 Weakness: Secondary | ICD-10-CM | POA: Diagnosis not present

## 2023-12-02 DIAGNOSIS — R278 Other lack of coordination: Secondary | ICD-10-CM | POA: Diagnosis not present

## 2023-12-02 DIAGNOSIS — Z9181 History of falling: Secondary | ICD-10-CM | POA: Diagnosis not present

## 2023-12-04 ENCOUNTER — Encounter: Payer: Self-pay | Admitting: Internal Medicine

## 2023-12-04 ENCOUNTER — Non-Acute Institutional Stay (SKILLED_NURSING_FACILITY): Payer: Self-pay | Admitting: Internal Medicine

## 2023-12-04 DIAGNOSIS — D329 Benign neoplasm of meninges, unspecified: Secondary | ICD-10-CM | POA: Diagnosis not present

## 2023-12-04 DIAGNOSIS — I480 Paroxysmal atrial fibrillation: Secondary | ICD-10-CM

## 2023-12-04 DIAGNOSIS — R296 Repeated falls: Secondary | ICD-10-CM

## 2023-12-04 DIAGNOSIS — F03911 Unspecified dementia, unspecified severity, with agitation: Secondary | ICD-10-CM | POA: Diagnosis not present

## 2023-12-04 DIAGNOSIS — G8929 Other chronic pain: Secondary | ICD-10-CM | POA: Diagnosis not present

## 2023-12-04 DIAGNOSIS — M545 Low back pain, unspecified: Secondary | ICD-10-CM | POA: Diagnosis not present

## 2023-12-04 DIAGNOSIS — E7800 Pure hypercholesterolemia, unspecified: Secondary | ICD-10-CM

## 2023-12-04 DIAGNOSIS — M810 Age-related osteoporosis without current pathological fracture: Secondary | ICD-10-CM

## 2023-12-04 NOTE — Progress Notes (Signed)
 Location:  Medical illustrator of Service:  SNF ([id number])  Provider: Marguerite Shiley   Code Status: DNR Goals of Care:     11/14/2023   10:17 AM  Advanced Directives  Does Patient Have a Medical Advance Directive? Yes  Type of Estate agent of [place];Living will;Out of facility DNR (pink MOST or yellow form)  Does patient want to make changes to medical advance directive? No - Patient declined  Copy of Healthcare Power of Attorney in Chart? Yes - validated most recent copy scanned in chart (See row information)     Chief Complaint  Patient presents with   Care Management    HPI: Patient is a 89 y.o. female seen today for medical management of chronic diseases.   lives in [place] in [place]   Patient has known h/o Meningioma.  MRI it showed meningioma again which has increased in size causing compression on medulla Not Surgical candidate H/o Low Back Pain with Radiculopathy follows with Neurology H/o Breast Cancer Invasive Ductal Carcinoma diagnosed in 2016 On Prolia  for Osteoporosis H/o PAF  Follows with Cardiology On Eliquis  Also h/o Bradycardia Hypertension with Tendency for Orthostatic Hypotension     Acute issues today is her Behavior Per nurses she is now having Worsening Cognition More depressed and Paranoid She now stays on her Wheelchair and some times in the evening gets upset and wants to go to other hallways and becomes paranoid and upset with nurses Patient continues to struggle with Aphasia  No Falls as she stays in Wheelchair  Wt Readings from Last 3 Encounters:  11/27/23 137 lb 4.8 oz (62.3 kg)  11/14/23 135 lb 6.4 oz (61.4 kg)  10/24/23 138 lb 9.6 oz (62.9 kg)    Past Medical History:  Diagnosis Date   Allergy    Ankle fracture, left    Atrial fibrillation (HCC)    Per [facility] New Patient Packet    Breast cancer (HCC) 1115/16   left    Bursitis of right shoulder    Cataract    Family history of cancer    Fracture  of right wrist    GERD (gastroesophageal reflux disease)    Hyperlipidemia    Hypertension    Lumbar radiculitis    Per [facility] New Patient Packet    Meningioma [facility])    Per [facility] New Patient Packet    Skin cancer 2000   melanoma and basal cell   Stress fracture    Right Heel, Per [facility] New Patient Packet    TIA (transient ischemic attack) 2022   Vaso vagal episode    during preparation for colonoscopy    Past Surgical History:  Procedure Laterality Date   ABDOMINAL HYSTERECTOMY  1988   TAH/BSO--FIBROIDS   APPENDECTOMY     BREAST LUMPECTOMY     B/L--[facility]   BREAST LUMPECTOMY WITH RADIOACTIVE SEED AND SENTINEL LYMPH NODE BIOPSY Left 06/11/2015   Procedure: LEFT BREAST LUMPECTOMY WITH RADIOACTIVE SEED AND LEFT SENTINEL LYMPH NODE MAPPING;  Surgeon: Sim Dryer, MD;  Location: [facility] OR;  Service: General;  Laterality: Left;   CARDIOVERSION N/A 01/11/2022   Procedure: CARDIOVERSION;  Surgeon: Sheryle Donning, MD;  Location: [facility] ENDOSCOPY;  Service: Cardiovascular;  Laterality: N/A;   CATARACT EXTRACTION  2015   COLONOSCOPY  2010   DG  BONE DENSITY ([facility] HX)     EYE SURGERY Bilateral    cataracts   fiberadenoma Bilateral 1978, 1980   GANGLION CYST EXCISION  L  hand   Lipiflow procedure      Allergies  Allergen Reactions   Sulfa Antibiotics Other (See Comments)    As a child, became "rigid as a stick and not responsive"   Tape Other (See Comments)    Blisters, Please use "paper" tape only for short periods of time   Wound Dressing Adhesive Other (See Comments)    Blisters, Please use "paper" tape only for short periods of time, Blisters, Please use "paper" tape only for short periods of time   Glucosamine Forte [Nutritional Supplements] Rash   Latex Rash    Outpatient Encounter Medications as of 12/04/2023  Medication Sig   acetaminophen  (TYLENOL ) 500 MG tablet Take 1,000 mg by mouth 3 (three) times daily.   apixaban  (ELIQUIS ) 2.5 MG TABS tablet Take 1 tablet (2.5 mg  total) by mouth 2 (two) times daily.   Calcium  Carb-Cholecalciferol  (CALCIUM  600+D3) 600-10 MG-MCG TABS Take 1 tablet by mouth in the morning.   Cholecalciferol  (VITAMIN D3) 1000 UNITS CAPS Take 1,000 Units by mouth in the morning.   cycloSPORINE  (RESTASIS ) 0.05 % ophthalmic emulsion Place 1 drop into both eyes 2 (two) times daily.  after applying a hot compress   denosumab  (PROLIA ) 60 MG/ML SOSY injection Inject 60 mg into the skin every 6 (six) months.   divalproex  (DEPAKOTE ) 125 MG DR tablet Take 1 tablet (125 mg total) by mouth 2 (two) times daily.   escitalopram (LEXAPRO) 5 MG tablet Take 20 mg by mouth in the morning.   hydrALAZINE (APRESOLINE) 10 MG tablet Take 10 mg by mouth every 8 (eight) hours as needed.   metoprolol  succinate (TOPROL  XL) 25 MG 24 hr tablet Take 25 mg every morning   mirtazapine (REMERON) 15 MG tablet Take 15 mg by mouth at bedtime.   polyethylene glycol (MIRALAX  / GLYCOLAX ) 17 g packet Take 17 g by mouth in the morning.   pravastatin  (PRAVACHOL ) 40 MG tablet TAKE ONE TABLET BY MOUTH ONCE DAILY   senna (SENOKOT) 8.6 MG TABS tablet Take 2 tablets (17.2 mg total) by mouth at bedtime.   sodium chloride  (MURO 128) 5 % ophthalmic ointment Place 1 Application into both eyes at bedtime.   vitamin B-12 (CYANOCOBALAMIN ) 1000 MCG tablet Take 1 tablet (1,000 mcg total) by mouth daily.   Facility-Administered Encounter Medications as of 12/04/2023  Medication   [START ON 03/19/2024] denosumab  (PROLIA ) injection 60 mg    Review of Systems:  Review of Systems  Constitutional:  Negative for activity change and appetite change.  HENT: Negative.    Respiratory:  Negative for cough and shortness of breath.   Cardiovascular:  Negative for leg swelling.  Gastrointestinal:  Negative for constipation.  Genitourinary: Negative.   Musculoskeletal:  Positive for gait problem. Negative for arthralgias and myalgias.  Skin: Negative.   Neurological:  Negative for dizziness and weakness.   Psychiatric/Behavioral:  Positive for confusion and dysphoric mood. Negative for sleep disturbance. The patient is nervous/anxious.     Health Maintenance  Topic Date Due   COVID-19 Vaccine (7 - Pfizer risk 2024-25 season) 10/17/2023   MAMMOGRAM  07/20/2024 (Originally 07/12/2023)   INFLUENZA VACCINE  01/26/2024   Medicare Annual Wellness (AWV)  03/06/2024   DTaP/Tdap/Td (6 - Td or Tdap) 10/24/2030   Pneumonia Vaccine 61+ Years old  Completed   DEXA SCAN  Completed   Zoster Vaccines- Shingrix  Completed   HPV VACCINES  Aged Out   Meningococcal B Vaccine  Aged Out    Physical  Exam: There were no vitals filed for this visit. There is no height or weight on file to calculate BMI. Physical Exam Vitals reviewed.  Constitutional:      Appearance: Normal appearance.  HENT:     Head: Normocephalic.     Nose: Nose normal.     Mouth/Throat:     Mouth: Mucous membranes are moist.     Pharynx: Oropharynx is clear.  Eyes:     Pupils: Pupils are equal, round, and reactive to light.  Cardiovascular:     Rate and Rhythm: Normal rate. Rhythm irregular.     Pulses: Normal pulses.     Heart sounds: Normal heart sounds. No murmur heard. Pulmonary:     Effort: Pulmonary effort is normal.     Breath sounds: Normal breath sounds.  Abdominal:     General: Abdomen is flat. Bowel sounds are normal.     Palpations: Abdomen is soft.  Musculoskeletal:        General: No swelling.     Cervical back: Neck supple.  Skin:    General: Skin is warm.  Neurological:     General: No focal deficit present.     Mental Status: She is alert.  Psychiatric:        Mood and Affect: Mood normal.        Thought Content: Thought content normal.     Labs reviewed: Basic Metabolic Panel: Recent Labs    12/04/22 1906 12/11/22 1937 12/12/22 0859 12/13/22 0411 02/23/23 0959 03/23/23 0000 08/24/23 0000 09/09/23 0650 09/09/23 0652  NA 141   < > 139   < > 140 144 142 141 145  K 4.1   < > 3.8   < > 3.7  4.7 4.1 3.7 4.3  CL 106   < > 101   < > 101 106 108 107 107  CO2 26   < > 25   < > 31 26* 24*  --  22  GLUCOSE 96   < > 88   < > 76  --   --  88 88  BUN 28*   < > 13   < > 15 18 26* 24* 22  CREATININE 1.06*   < > 0.77   < > 0.94 0.8 0.7 0.90 0.98  CALCIUM  9.6   < > 9.2   < > 9.8 9.8 9.3  --  9.9  MG 2.5*  --   --   --   --   --   --   --   --   TSH  --   --  3.099  --   --   --  2.16  --   --    < > = values in this interval not displayed.   Liver Function Tests: Recent Labs    01/30/23 2123 02/23/23 0959 08/24/23 0000 09/09/23 0652  AST 28 24 24 30   ALT 18 15 20 11   ALKPHOS 55 50 98 68  BILITOT 1.2 1.3*  --  0.6  PROT 5.7* 5.8*  --  6.1*  ALBUMIN 3.5 3.5 3.6 3.6   No results for input(s): "LIPASE", "AMYLASE" in the last 8760 hours. Recent Labs    12/11/22 2043  AMMONIA 12   CBC: Recent Labs    12/04/22 1906 12/11/22 1937 12/12/22 0859 01/30/23 2123 01/30/23 2133 02/23/23 0959 08/24/23 0000 09/09/23 0650 09/09/23 0652  WBC 8.5 9.1   < > 8.8  --  8.6 6.6  --  8.0  NEUTROABS 5.8 5.9  --   --   --  6.5  --   --   --   HGB 15.3* 17.6*   < > 14.9   < > 15.4* 13.7 16.7* 15.9*  HCT 45.3 51.4*   < > 43.8   < > 45.7 40 49.0* 47.2*  MCV 92.1 91.8   < > 90.7  --  90.9  --   --  92.9  PLT 191 217   < > 208  --  208 180  --  219   < > = values in this interval not displayed.   Lipid Panel: Recent Labs    08/24/23 0000  CHOL 122  HDL 58  LDLCALC 53  TRIG 57   Lab Results  Component Value Date   HGBA1C 4.8 05/11/2021    Procedures since last visit: No results found.  Assessment/Plan 1. Agitation due to dementia (HCC) (Primary) Will start her on Seroquel 12.5 mg BID for 2 weeks and reval She is already on Depakote  Would not change the does today   2. Frequent falls Now using Wheelchair  3. Paroxysmal atrial fibrillation (HCC) Low dose of Eliquis  Metoprolol   4. Pure hypercholesterolemia LDL 53 in 2/25  5. Meningioma [facility]) Not candidate for  surgery  6. Reactive depression Will discontinue Lexapro  Start  Zoloft 50 mg every day  She is also on Remeron 7. Chronic bilateral low back pain without sciatica Tylenol   8. Age-related osteoporosis without current pathological fracture Prolia  per [facility] Dec and March    Labs/tests ordered:    Marguerite Shiley, MD

## 2023-12-05 DIAGNOSIS — R278 Other lack of coordination: Secondary | ICD-10-CM | POA: Diagnosis not present

## 2023-12-05 DIAGNOSIS — R531 Weakness: Secondary | ICD-10-CM | POA: Diagnosis not present

## 2023-12-05 DIAGNOSIS — Z9181 History of falling: Secondary | ICD-10-CM | POA: Diagnosis not present

## 2023-12-07 DIAGNOSIS — Z9181 History of falling: Secondary | ICD-10-CM | POA: Diagnosis not present

## 2023-12-07 DIAGNOSIS — R531 Weakness: Secondary | ICD-10-CM | POA: Diagnosis not present

## 2023-12-07 DIAGNOSIS — R278 Other lack of coordination: Secondary | ICD-10-CM | POA: Diagnosis not present

## 2023-12-11 DIAGNOSIS — R278 Other lack of coordination: Secondary | ICD-10-CM | POA: Diagnosis not present

## 2023-12-11 DIAGNOSIS — R531 Weakness: Secondary | ICD-10-CM | POA: Diagnosis not present

## 2023-12-11 DIAGNOSIS — Z9181 History of falling: Secondary | ICD-10-CM | POA: Diagnosis not present

## 2023-12-13 ENCOUNTER — Encounter: Payer: Self-pay | Admitting: Orthopedic Surgery

## 2023-12-13 ENCOUNTER — Non-Acute Institutional Stay (SKILLED_NURSING_FACILITY): Payer: Self-pay | Admitting: Orthopedic Surgery

## 2023-12-13 DIAGNOSIS — R531 Weakness: Secondary | ICD-10-CM | POA: Diagnosis not present

## 2023-12-13 DIAGNOSIS — R278 Other lack of coordination: Secondary | ICD-10-CM | POA: Diagnosis not present

## 2023-12-13 DIAGNOSIS — Z9181 History of falling: Secondary | ICD-10-CM | POA: Diagnosis not present

## 2023-12-13 DIAGNOSIS — F03911 Unspecified dementia, unspecified severity, with agitation: Secondary | ICD-10-CM | POA: Diagnosis not present

## 2023-12-13 MED ORDER — QUETIAPINE FUMARATE 25 MG PO TABS: 25.0000 mg | ORAL_TABLET | Freq: Every morning | ORAL | Status: DC

## 2023-12-13 MED ORDER — LORAZEPAM 0.5 MG PO TABS: 0.5000 mg | ORAL_TABLET | Freq: Two times a day (BID) | ORAL | 0 refills | Status: DC | PRN

## 2023-12-13 NOTE — Progress Notes (Signed)
 Location:  [facility] Room Number: 127/A Place of Service:  SNF [phone]) Provider:  Arnetha Bhat, NP   Marguerite Shiley, MD  Patient Care Team: Marguerite Shiley, MD as PCP - General (Internal Medicine) Luana Rumple, MD as PCP - Cardiology (Cardiology) Denman Fischer, MD as Consulting Physician (Dermatology) Corie Diamond, MD as Consulting Physician (Ophthalmology) Merriam Abbey, DO as Consulting Physician (Neurology) Debbie Fails Laura Polio, NP as Nurse Practitioner (Hematology and Oncology)  Extended Emergency Contact Information Primary Emergency Contact: Ray,Ruth  [place] Home Phone: [phone] Mobile Phone: [phone] Relation: Daughter Secondary Emergency Contact: Loux,Rich  [place] Home Phone: [phone] Relation: Son  Code Status:  DNR Goals of care: Advanced Directive information    11/14/2023   10:17 AM  Advanced Directives  Does Patient Have a Medical Advance Directive? Yes  Type of Estate agent of [place];Living will;Out of facility DNR (pink MOST or yellow form)  Does patient want to make changes to medical advance directive? No - Patient declined  Copy of Healthcare Power of Attorney in Chart? Yes - validated most recent copy scanned in chart (See row information)     Chief Complaint  Patient presents with   Acute Visit    agitation    HPI:  Pt is a 89 y.o. female seen today for acute visit due to agitation.   She currently resides on the skilled nursing unit (admitted 02/23/2023) at [facility] due to weakness. PMH: PAF, HTN, HLD, left breast cancer, meningioma, osteopenia, and gait abnormality.   H/o dementia and meningioma. She has had worsening agitation/sundowning x 2 months. Also paranoid behaviors. Unsuccessful trial hydroxyzine . 04/29 she was started on Depakote . 06/09 she was started on low dose Seroquel and changed to Zoloft. Today, nursing reports she  is becoming more agitated earlier in the day. She was agitated after lunch. Behaviors have escalated though the afternoon and now she is yelling, grabbing and spitting at staff.  Afebrile. Vitals stable.    Past Medical History:  Diagnosis Date   Allergy    Ankle fracture, left    Atrial fibrillation (HCC)    Per [facility] New Patient Packet    Breast cancer (HCC) 1115/16   left    Bursitis of right shoulder    Cataract    Family history of cancer    Fracture of right wrist    GERD (gastroesophageal reflux disease)    Hyperlipidemia    Hypertension    Lumbar radiculitis    Per [facility] New Patient Packet    Meningioma [facility])    Per [facility] New Patient Packet    Skin cancer 2000   melanoma and basal cell   Stress fracture    Right Heel, Per [facility] New Patient Packet    TIA (transient ischemic attack) 2022   Vaso vagal episode    during preparation for colonoscopy   Past Surgical History:  Procedure Laterality Date   ABDOMINAL HYSTERECTOMY  1988   TAH/BSO--FIBROIDS   APPENDECTOMY     BREAST LUMPECTOMY     B/L--[facility]   BREAST LUMPECTOMY WITH RADIOACTIVE SEED AND SENTINEL LYMPH NODE BIOPSY Left 06/11/2015   Procedure: LEFT BREAST LUMPECTOMY WITH RADIOACTIVE SEED AND LEFT SENTINEL LYMPH NODE MAPPING;  Surgeon: Sim Dryer, MD;  Location: [facility] OR;  Service: General;  Laterality: Left;   CARDIOVERSION N/A 01/11/2022   Procedure: CARDIOVERSION;  Surgeon: Sheryle Donning, MD;  Location: [facility] ENDOSCOPY;  Service: Cardiovascular;  Laterality: N/A;  CATARACT EXTRACTION  2015   COLONOSCOPY  2010   DG  BONE DENSITY ([facility] HX)     EYE SURGERY Bilateral    cataracts   fiberadenoma Bilateral 1978, 1980   GANGLION CYST EXCISION     L  hand   Lipiflow procedure      Allergies  Allergen Reactions   Sulfa Antibiotics Other (See Comments)    As a child, became rigid as a stick and not responsive   Tape Other (See Comments)    Blisters, Please use paper tape only for short periods of time    Wound Dressing Adhesive Other (See Comments)    Blisters, Please use paper tape only for short periods of time, Blisters, Please use paper tape only for short periods of time   Glucosamine Forte [Nutritional Supplements] Rash   Latex Rash    Outpatient Encounter Medications as of 12/13/2023  Medication Sig   acetaminophen  (TYLENOL ) 500 MG tablet Take 1,000 mg by mouth 3 (three) times daily.   apixaban  (ELIQUIS ) 2.5 MG TABS tablet Take 1 tablet (2.5 mg total) by mouth 2 (two) times daily.   Calcium  Carb-Cholecalciferol  (CALCIUM  600+D3) 600-10 MG-MCG TABS Take 1 tablet by mouth in the morning.   Cholecalciferol  (VITAMIN D3) 1000 UNITS CAPS Take 1,000 Units by mouth in the morning.   cycloSPORINE  (RESTASIS ) 0.05 % ophthalmic emulsion Place 1 drop into both eyes 2 (two) times daily.  after applying a hot compress   denosumab  (PROLIA ) 60 MG/ML SOSY injection Inject 60 mg into the skin every 6 (six) months.   divalproex  (DEPAKOTE ) 125 MG DR tablet Take 1 tablet (125 mg total) by mouth 2 (two) times daily.   hydrALAZINE (APRESOLINE) 10 MG tablet Take 10 mg by mouth every 8 (eight) hours as needed.   metoprolol  succinate (TOPROL  XL) 25 MG 24 hr tablet Take 25 mg every morning   mirtazapine (REMERON) 15 MG tablet Take 15 mg by mouth at bedtime.   polyethylene glycol (MIRALAX  / GLYCOLAX ) 17 g packet Take 17 g by mouth in the morning.   pravastatin  (PRAVACHOL ) 40 MG tablet TAKE ONE TABLET BY MOUTH ONCE DAILY   QUEtiapine (SEROQUEL) 12.5 mg TABS tablet Take 12.5 mg by mouth 2 (two) times daily.   senna (SENOKOT) 8.6 MG TABS tablet Take 2 tablets (17.2 mg total) by mouth at bedtime.   sertraline (ZOLOFT) 50 MG tablet Take 50 mg by mouth daily.   sodium chloride  (MURO 128) 5 % ophthalmic ointment Place 1 Application into both eyes at bedtime.   vitamin B-12 (CYANOCOBALAMIN ) 1000 MCG tablet Take 1 tablet (1,000 mcg total) by mouth daily.   Facility-Administered Encounter Medications as of 12/13/2023   Medication   [START ON 03/19/2024] denosumab  (PROLIA ) injection 60 mg    Review of Systems  Unable to perform ROS: Dementia    Immunization History  Administered Date(s) Administered   Fluad  Quad(high Dose 65+) 02/27/2020, 03/29/2022   H1N1 07/01/2008   Influenza Whole 04/10/2007, 04/01/2009, 03/26/2010, 03/25/2012   Influenza, High Dose Seasonal PF 04/16/2014, 04/09/2015, 04/03/2017, 04/19/2018, 02/28/2019, 03/08/2021, 04/18/2023   Influenza-Unspecified 06/27/2016, 04/03/2017   Moderna Sars-Covid-2 Vaccination 04/27/2022   PFIZER Comirnaty(Gray Top)Covid-19 Tri-Sucrose Vaccine 01/05/2021   PFIZER(Purple Top)SARS-COV-2 Vaccination 07/18/2019, 08/08/2019, 03/29/2020   Pneumococcal Conjugate-13 05/13/2014   Pneumococcal Polysaccharide-23 08/29/2016   Td 11/05/2003   Tdap 06/28/2011, 09/19/2011, 10/23/2020   Tetanus 10/23/2020   Unspecified SARS-COV-2 Vaccination 04/18/2023   Zoster Recombinant(Shingrix) 10/08/2016, 01/03/2017   Zoster, Live 02/24/2010   Pertinent  Health  Maintenance Due  Topic Date Due   MAMMOGRAM  07/20/2024 (Originally 07/12/2023)   INFLUENZA VACCINE  01/26/2024   DEXA SCAN  Completed      04/25/2023    1:52 PM 05/23/2023    3:02 PM 06/19/2023    4:14 PM 08/15/2023    5:28 PM 10/10/2023    3:19 PM  Fall Risk  Falls in the past year? 1 1 0 1 1  Was there an injury with Fall? 1 1 0 0 1  Fall Risk Category Calculator 3 3 0 1 3  Patient at Risk for Falls Due to History of fall(s);Impaired balance/gait;Impaired mobility History of fall(s);Impaired balance/gait;Impaired mobility No Fall Risks History of fall(s);Impaired balance/gait History of fall(s);Impaired balance/gait;Impaired mobility  Fall risk Follow up Falls evaluation completed;Education provided;Falls prevention discussed Falls evaluation completed;Education provided;Falls prevention discussed Falls evaluation completed Falls evaluation completed;Education provided Falls evaluation completed;Education  provided   Functional Status Survey:    Vitals:   12/13/23 1627  BP: 114/68  Pulse: 65  Resp: 16  Temp: (!) 97.2 F (36.2 C)  SpO2: 94%  Weight: 137 lb 4.8 oz (62.3 kg)  Height: 4' 8 (1.422 m)   Body mass index is 30.78 kg/m. Physical Exam Vitals reviewed.  Constitutional:      General: She is not in acute distress. HENT:     Head: Normocephalic and atraumatic.   Eyes:     General:        Right eye: No discharge.        Left eye: No discharge.    Cardiovascular:     Rate and Rhythm: Normal rate and regular rhythm.     Pulses: Normal pulses.     Heart sounds: Normal heart sounds.  Pulmonary:     Effort: Pulmonary effort is normal.     Breath sounds: Normal breath sounds.  Abdominal:     General: There is no distension.     Palpations: Abdomen is soft.     Tenderness: There is no abdominal tenderness.   Musculoskeletal:     Cervical back: Neck supple.     Right lower leg: No edema.     Left lower leg: No edema.   Skin:    General: Skin is warm.   Neurological:     General: No focal deficit present.     Mental Status: She is alert. Mental status is at baseline.     Motor: Weakness present.     Gait: Gait abnormal.   Psychiatric:        Mood and Affect: Mood normal.     Labs reviewed: Recent Labs    02/23/23 0959 03/23/23 0000 08/24/23 0000 09/09/23 0650 09/09/23 0652  NA 140 144 142 141 145  K 3.7 4.7 4.1 3.7 4.3  CL 101 106 108 107 107  CO2 31 26* 24*  --  22  GLUCOSE 76  --   --  88 88  BUN 15 18 26* 24* 22  CREATININE 0.94 0.8 0.7 0.90 0.98  CALCIUM  9.8 9.8 9.3  --  9.9   Recent Labs    01/30/23 2123 02/23/23 0959 08/24/23 0000 09/09/23 0652  AST 28 24 24 30   ALT 18 15 20 11   ALKPHOS 55 50 98 68  BILITOT 1.2 1.3*  --  0.6  PROT 5.7* 5.8*  --  6.1*  ALBUMIN 3.5 3.5 3.6 3.6   Recent Labs    01/30/23 2123 01/30/23 2133 02/23/23 0959 08/24/23 0000 09/09/23 0650 09/09/23 4098  WBC 8.8  --  8.6 6.6  --  8.0  NEUTROABS  --    --  6.5  --   --   --   HGB 14.9   < > 15.4* 13.7 16.7* 15.9*  HCT 43.8   < > 45.7 40 49.0* 47.2*  MCV 90.7  --  90.9  --   --  92.9  PLT 208  --  208 180  --  219   < > = values in this interval not displayed.   Lab Results  Component Value Date   TSH 2.16 08/24/2023   Lab Results  Component Value Date   HGBA1C 4.8 05/11/2021   Lab Results  Component Value Date   CHOL 122 08/24/2023   HDL 58 08/24/2023   LDLCALC 53 08/24/2023   LDLDIRECT 153.4 11/16/2009   TRIG 57 08/24/2023   CHOLHDL 2.5 05/11/2021    Significant Diagnostic Results in last 30 days:  No results found.  Assessment/Plan 1. Agitation due to dementia [facility]) (Primary) - worsening behaviors after starting Depakote  04/29 - 06/09 started on low dose Seroquel and Zoloft - sundowning and becoming more agitated earlier in the day - observed yelling/spitting/grabbing at 4 pm> improved with Ativan  0.5 mg IM once - will increase Seroquel to 25 mg QAM - cont Seroquel 12.5 mg po at bedtime - start ativan  0.5 mg po BID PRN x 14 days  - QUEtiapine (SEROQUEL) 25 MG tablet; Take 1 tablet (25 mg total) by mouth every morning. - LORazepam  (ATIVAN ) 0.5 MG tablet; Take 1 tablet (0.5 mg total) by mouth 2 (two) times daily as needed for up to 14 days (give for increased agitation or sundowning behaviors).  Dispense: 28 tablet; Refill: 0    Family/ staff Communication: plan discussed with nursing   Labs/tests ordered:  none

## 2023-12-15 ENCOUNTER — Telehealth: Payer: Self-pay

## 2023-12-15 DIAGNOSIS — R531 Weakness: Secondary | ICD-10-CM | POA: Diagnosis not present

## 2023-12-15 DIAGNOSIS — Z9181 History of falling: Secondary | ICD-10-CM | POA: Diagnosis not present

## 2023-12-15 DIAGNOSIS — R278 Other lack of coordination: Secondary | ICD-10-CM | POA: Diagnosis not present

## 2023-12-15 NOTE — Telephone Encounter (Signed)
 Spoke with pharmacy and they are asking for clarification for medication Quetiapine is the patient suppose to take the 12.5 mg at bedtime or 25 mg in the morning.  Pharmacy is also asking when does the patient needs to start taking the 12. 5 mg. Please Advise  Message sent to Marguerite Shiley, MD

## 2023-12-18 ENCOUNTER — Non-Acute Institutional Stay (SKILLED_NURSING_FACILITY): Payer: Self-pay | Admitting: Internal Medicine

## 2023-12-18 ENCOUNTER — Encounter: Payer: Self-pay | Admitting: Internal Medicine

## 2023-12-18 DIAGNOSIS — D329 Benign neoplasm of meninges, unspecified: Secondary | ICD-10-CM

## 2023-12-18 DIAGNOSIS — R296 Repeated falls: Secondary | ICD-10-CM | POA: Diagnosis not present

## 2023-12-18 DIAGNOSIS — I480 Paroxysmal atrial fibrillation: Secondary | ICD-10-CM | POA: Diagnosis not present

## 2023-12-18 DIAGNOSIS — M542 Cervicalgia: Secondary | ICD-10-CM | POA: Diagnosis not present

## 2023-12-18 DIAGNOSIS — F03911 Unspecified dementia, unspecified severity, with agitation: Secondary | ICD-10-CM | POA: Diagnosis not present

## 2023-12-18 MED ORDER — LORAZEPAM 0.5 MG PO TABS: 0.2500 mg | ORAL_TABLET | Freq: Two times a day (BID) | ORAL | Status: DC | PRN

## 2023-12-18 NOTE — Progress Notes (Signed)
 Location:  [facility] Room Number: 127 A Place of Service:  SNF [id number]) Provider: Charlanne Fredia CROME, MD  Patient Care Team: Charlanne Fredia CROME, MD as PCP - General (Internal Medicine) Francyne Headland, MD as PCP - Cardiology (Cardiology) Shona Rush, MD as Consulting Physician (Dermatology) Camillo Golas, MD as Consulting Physician (Ophthalmology) Skeet Juliene SAUNDERS, DO as Consulting Physician (Neurology) Crawford Morna Pickle, NP as Nurse Practitioner (Hematology and Oncology)  Extended Emergency Contact Information Primary Emergency Contact: Ray,Ruth  [place] Home Phone: [phone] Mobile Phone: [phone] Relation: Daughter Secondary Emergency Contact: Paterson,Rich  [place] Home Phone: [phone] Relation: Son  Code Status:  DNR Goals of care: Advanced Directive information    12/18/2023   11:01 AM  Advanced Directives  Does Patient Have a Medical Advance Directive? Yes  Type of Estate agent of [place];Living will;Out of facility DNR (pink MOST or yellow form)  Copy of Healthcare Power of Attorney in Chart? Yes - validated most recent copy scanned in chart (See row information)     Chief Complaint  Patient presents with   Acute Visit    HPI:  Pt is a 89 y.o. female seen today for an acute visit for Behaviors   lives in SNF in [place]   Patient recently has been having behavior issues.. She was started on Seroquel and Depakote  also on Zoloft She continues to have behavior issues including elopement and hitting the staff.  But patient when patient got these medications this morning she became lethargic.  Did not as needed Ativan  is also making her little lethargic Patient also has been complaining of neck pain But she denied any pain to me She continues to struggle with aphasia. Did have a fall recently.  Patient has known h/o Meningioma.  MRI it showed meningioma again which has  increased in size causing compression on medulla Not Surgical candidate H/o Low Back Pain with Radiculopathy  H/o Breast Cancer Invasive Ductal Carcinoma diagnosed in 2016 On Prolia  for Osteoporosis H/o PAF  Follows with Cardiology On Eliquis  Also h/o Bradycardia Hypertension with Tendency for Orthostatic Hypotension Past Medical History:  Diagnosis Date   Allergy    Ankle fracture, left    Atrial fibrillation (HCC)    Per [facility] New Patient Packet    Breast cancer (HCC) 1115/16   left    Bursitis of right shoulder    Cataract    Family history of cancer    Fracture of right wrist    GERD (gastroesophageal reflux disease)    Hyperlipidemia    Hypertension    Lumbar radiculitis    Per [facility] New Patient Packet    Meningioma [facility])    Per [facility] New Patient Packet    Skin cancer 2000   melanoma and basal cell   Stress fracture    Right Heel, Per [facility] New Patient Packet    TIA (transient ischemic attack) 2022   Vaso vagal episode    during preparation for colonoscopy   Past Surgical History:  Procedure Laterality Date   ABDOMINAL HYSTERECTOMY  1988   TAH/BSO--FIBROIDS   APPENDECTOMY     BREAST LUMPECTOMY     B/L--[facility]   BREAST LUMPECTOMY WITH RADIOACTIVE SEED AND SENTINEL LYMPH NODE BIOPSY Left 06/11/2015   Procedure: LEFT BREAST LUMPECTOMY WITH RADIOACTIVE SEED AND LEFT SENTINEL LYMPH NODE MAPPING;  Surgeon: Debby Shipper, MD;  Location: [facility] OR;  Service: General;  Laterality: Left;   CARDIOVERSION N/A 01/11/2022  Procedure: CARDIOVERSION;  Surgeon: Lonni Slain, MD;  Location: [facility] ENDOSCOPY;  Service: Cardiovascular;  Laterality: N/A;   CATARACT EXTRACTION  2015   COLONOSCOPY  2010   DG  BONE DENSITY ([facility] HX)     EYE SURGERY Bilateral    cataracts   fiberadenoma Bilateral 1978, 1980   GANGLION CYST EXCISION     L  hand   Lipiflow procedure      Allergies  Allergen Reactions   Sulfa Antibiotics Other (See Comments)    As a child, became rigid as a stick and not  responsive   Tape Other (See Comments)    Blisters, Please use paper tape only for short periods of time   Wound Dressing Adhesive Other (See Comments)    Blisters, Please use paper tape only for short periods of time, Blisters, Please use paper tape only for short periods of time   Glucosamine Forte [Nutritional Supplements] Rash   Latex Rash    Outpatient Encounter Medications as of 12/18/2023  Medication Sig   acetaminophen  (TYLENOL ) 500 MG tablet Take 1,000 mg by mouth 3 (three) times daily.   apixaban  (ELIQUIS ) 2.5 MG TABS tablet Take 1 tablet (2.5 mg total) by mouth 2 (two) times daily.   Calcium  Carb-Cholecalciferol  (CALCIUM  600+D3) 600-10 MG-MCG TABS Take 1 tablet by mouth in the morning.   Cholecalciferol  (VITAMIN D3) 1000 UNITS CAPS Take 1,000 Units by mouth in the morning.   cycloSPORINE  (RESTASIS ) 0.05 % ophthalmic emulsion Place 1 drop into both eyes 2 (two) times daily.  after applying a hot compress   diclofenac Sodium (VOLTAREN) 1 % GEL Apply 2 g topically 3 (three) times daily.   divalproex  (DEPAKOTE ) 125 MG DR tablet Take 1 tablet (125 mg total) by mouth 2 (two) times daily.   hydrALAZINE (APRESOLINE) 10 MG tablet Take 10 mg by mouth every 8 (eight) hours as needed.   lidocaine  (LIDODERM ) 5 % Place 1 patch onto the skin daily. Remove & Discard patch within 12 hours or as directed by MD   LORazepam  (ATIVAN ) 0.5 MG tablet Take 1 tablet (0.5 mg total) by mouth 2 (two) times daily as needed for up to 14 days (give for increased agitation or sundowning behaviors).   metoprolol  succinate (TOPROL  XL) 25 MG 24 hr tablet Take 25 mg every morning   mirtazapine (REMERON) 15 MG tablet Take 15 mg by mouth at bedtime.   polyethylene glycol (MIRALAX  / GLYCOLAX ) 17 g packet Take 17 g by mouth in the morning.   pravastatin  (PRAVACHOL ) 40 MG tablet TAKE ONE TABLET BY MOUTH ONCE DAILY   QUEtiapine (SEROQUEL) 25 MG tablet Take 1 tablet (25 mg total) by mouth every morning.   senna  (SENOKOT) 8.6 MG TABS tablet Take 2 tablets (17.2 mg total) by mouth at bedtime.   sertraline (ZOLOFT) 50 MG tablet Take 50 mg by mouth daily.   sodium chloride  (MURO 128) 5 % ophthalmic ointment Place 1 Application into both eyes at bedtime.   vitamin B-12 (CYANOCOBALAMIN ) 1000 MCG tablet Take 1 tablet (1,000 mcg total) by mouth daily.   denosumab  (PROLIA ) 60 MG/ML SOSY injection Inject 60 mg into the skin every 6 (six) months. (Patient not taking: Reported on 12/18/2023)   QUEtiapine (SEROQUEL) 12.5 mg TABS tablet Take 12.5 mg by mouth at bedtime. (Patient not taking: Reported on 12/18/2023)   Facility-Administered Encounter Medications as of 12/18/2023  Medication   [START ON 03/19/2024] denosumab  (PROLIA ) injection 60 mg    Review of Systems  Reason  unable to perform ROS: Dementia and aphasia.    Immunization History  Administered Date(s) Administered   Fluad  Quad(high Dose 65+) 02/27/2020, 03/29/2022   H1N1 07/01/2008   Influenza Whole 04/10/2007, 04/01/2009, 03/26/2010, 03/25/2012   Influenza, High Dose Seasonal PF 04/16/2014, 04/09/2015, 04/03/2017, 04/19/2018, 02/28/2019, 03/08/2021, 04/18/2023   Influenza-Unspecified 06/27/2016, 04/03/2017   Moderna Sars-Covid-2 Vaccination 04/27/2022   PFIZER Comirnaty(Gray Top)Covid-19 Tri-Sucrose Vaccine 01/05/2021   PFIZER(Purple Top)SARS-COV-2 Vaccination 07/18/2019, 08/08/2019, 03/29/2020   Pneumococcal Conjugate-13 05/13/2014   Pneumococcal Polysaccharide-23 08/29/2016   Td 11/05/2003   Tdap 06/28/2011, 09/19/2011, 10/23/2020   Tetanus 10/23/2020   Unspecified SARS-COV-2 Vaccination 04/18/2023   Zoster Recombinant(Shingrix) 10/08/2016, 01/03/2017   Zoster, Live 02/24/2010   Pertinent  Health Maintenance Due  Topic Date Due   MAMMOGRAM  07/20/2024 (Originally 07/12/2023)   INFLUENZA VACCINE  01/26/2024   DEXA SCAN  Completed      04/25/2023    1:52 PM 05/23/2023    3:02 PM 06/19/2023    4:14 PM 08/15/2023    5:28 PM 10/10/2023     3:19 PM  Fall Risk  Falls in the past year? 1 1 0 1 1  Was there an injury with Fall? 1 1 0 0 1  Fall Risk Category Calculator 3 3 0 1 3  Patient at Risk for Falls Due to History of fall(s);Impaired balance/gait;Impaired mobility History of fall(s);Impaired balance/gait;Impaired mobility No Fall Risks History of fall(s);Impaired balance/gait History of fall(s);Impaired balance/gait;Impaired mobility  Fall risk Follow up Falls evaluation completed;Education provided;Falls prevention discussed Falls evaluation completed;Education provided;Falls prevention discussed Falls evaluation completed Falls evaluation completed;Education provided Falls evaluation completed;Education provided   Functional Status Survey:    Vitals:   12/18/23 1058  BP: 121/73  Pulse: 71  Resp: 18  Temp: (!) 97 F (36.1 C)  SpO2: 97%  Weight: 137 lb 4.8 oz (62.3 kg)   Body mass index is 30.78 kg/m. Physical Exam Vitals reviewed.  Constitutional:      Appearance: Normal appearance.  HENT:     Head: Normocephalic.     Nose: Nose normal.     Mouth/Throat:     Mouth: Mucous membranes are moist.     Pharynx: Oropharynx is clear.   Eyes:     Pupils: Pupils are equal, round, and reactive to light.    Cardiovascular:     Rate and Rhythm: Normal rate. Rhythm irregular.     Pulses: Normal pulses.     Heart sounds: Normal heart sounds. No murmur heard. Pulmonary:     Effort: Pulmonary effort is normal.     Breath sounds: Normal breath sounds.  Abdominal:     General: Abdomen is flat. Bowel sounds are normal.     Palpations: Abdomen is soft.   Musculoskeletal:        General: No swelling.     Cervical back: Neck supple.   Skin:    General: Skin is warm.   Neurological:     General: No focal deficit present.     Mental Status: She is alert.   Psychiatric:        Mood and Affect: Mood normal.        Thought Content: Thought content normal.     Labs reviewed: Recent Labs    02/23/23 0959  03/23/23 0000 08/24/23 0000 09/09/23 0650 09/09/23 0652  NA 140 144 142 141 145  K 3.7 4.7 4.1 3.7 4.3  CL 101 106 108 107 107  CO2 31 26* 24*  --  22  GLUCOSE 76  --   --  88 88  BUN 15 18 26* 24* 22  CREATININE 0.94 0.8 0.7 0.90 0.98  CALCIUM  9.8 9.8 9.3  --  9.9   Recent Labs    01/30/23 2123 02/23/23 0959 08/24/23 0000 09/09/23 0652  AST 28 24 24 30   ALT 18 15 20 11   ALKPHOS 55 50 98 68  BILITOT 1.2 1.3*  --  0.6  PROT 5.7* 5.8*  --  6.1*  ALBUMIN 3.5 3.5 3.6 3.6   Recent Labs    01/30/23 2123 01/30/23 2133 02/23/23 0959 08/24/23 0000 09/09/23 0650 09/09/23 0652  WBC 8.8  --  8.6 6.6  --  8.0  NEUTROABS  --   --  6.5  --   --   --   HGB 14.9   < > 15.4* 13.7 16.7* 15.9*  HCT 43.8   < > 45.7 40 49.0* 47.2*  MCV 90.7  --  90.9  --   --  92.9  PLT 208  --  208 180  --  219   < > = values in this interval not displayed.   Lab Results  Component Value Date   TSH 2.16 08/24/2023   Lab Results  Component Value Date   HGBA1C 4.8 05/11/2021   Lab Results  Component Value Date   CHOL 122 08/24/2023   HDL 58 08/24/2023   LDLCALC 53 08/24/2023   LDLDIRECT 153.4 11/16/2009   TRIG 57 08/24/2023   CHOLHDL 2.5 05/11/2021    Significant Diagnostic Results in last 30 days:  No results found.  Assessment/Plan 1. Agitation due to dementia (HCC) (Primary) Will discontinue Seroquel Continue Depakote  Ativan  0.25 mg BID PRN for now Also on Zoloft  2. Frequent falls Due to her Cognition and Unstable gait Uses her Wheelchair now  3. Paroxysmal atrial fibrillation (HCC) For now continue eliquis   4. Meningioma [facility]) Not surgical candidate  5. Neck pain Seems Musculoskeletal On tylenol  Can use Voltaren    Family/ staff Communication:   Labs/tests ordered:

## 2023-12-21 DIAGNOSIS — J069 Acute upper respiratory infection, unspecified: Secondary | ICD-10-CM | POA: Diagnosis not present

## 2023-12-21 LAB — BASIC METABOLIC PANEL WITH GFR
BUN: 24 — AB (ref 4–21)
CO2: 24 — AB (ref 13–22)
Chloride: 104 (ref 99–108)
Creatinine: 0.8 (ref 0.5–1.1)
Glucose: 85
Potassium: 4.2 meq/L (ref 3.5–5.1)
Sodium: 146 (ref 137–147)

## 2023-12-21 LAB — CBC AND DIFFERENTIAL
HCT: 48 — AB (ref 36–46)
Hemoglobin: 15.6 (ref 12.0–16.0)
Platelets: 200 K/uL (ref 150–400)
WBC: 8.2

## 2023-12-21 LAB — COMPREHENSIVE METABOLIC PANEL WITH GFR
Albumin: 4.1 (ref 3.5–5.0)
Calcium: 9.9 (ref 8.7–10.7)
Globulin: 2.3
eGFR: 69

## 2023-12-21 LAB — HEPATIC FUNCTION PANEL
ALT: 15 U/L (ref 7–35)
AST: 39 — AB (ref 13–35)
Alkaline Phosphatase: 90 (ref 25–125)
Bilirubin, Total: 0.4

## 2023-12-21 LAB — CBC: RBC: 5.01 (ref 3.87–5.11)

## 2023-12-25 ENCOUNTER — Encounter: Payer: Self-pay | Admitting: Internal Medicine

## 2023-12-25 ENCOUNTER — Non-Acute Institutional Stay (SKILLED_NURSING_FACILITY): Payer: Self-pay | Admitting: Internal Medicine

## 2023-12-25 ENCOUNTER — Other Ambulatory Visit: Payer: Self-pay | Admitting: Internal Medicine

## 2023-12-25 DIAGNOSIS — F329 Major depressive disorder, single episode, unspecified: Secondary | ICD-10-CM

## 2023-12-25 DIAGNOSIS — F03911 Unspecified dementia, unspecified severity, with agitation: Secondary | ICD-10-CM

## 2023-12-25 DIAGNOSIS — D329 Benign neoplasm of meninges, unspecified: Secondary | ICD-10-CM

## 2023-12-25 DIAGNOSIS — R296 Repeated falls: Secondary | ICD-10-CM

## 2023-12-25 MED ORDER — LORAZEPAM 0.5 MG PO TABS: 0.2500 mg | ORAL_TABLET | Freq: Two times a day (BID) | ORAL | 0 refills | Status: AC | PRN

## 2023-12-25 MED ORDER — BUSPIRONE HCL 5 MG PO TABS: 5.0000 mg | ORAL_TABLET | Freq: Every evening | ORAL | Status: DC

## 2023-12-25 NOTE — Progress Notes (Signed)
 Location:  [facility] Room Number: 127 A Place of Service:  SNF [phone]) Provider:  Charlanne Fredia CROME, MD  Patient Care Team: Charlanne Fredia CROME, MD as PCP - General (Internal Medicine) Francyne Headland, MD as PCP - Cardiology (Cardiology) Shona Rush, MD as Consulting Physician (Dermatology) Camillo Golas, MD as Consulting Physician (Ophthalmology) Skeet Juliene SAUNDERS, DO as Consulting Physician (Neurology) Crawford Morna Pickle, NP as Nurse Practitioner (Hematology and Oncology)  Extended Emergency Contact Information Primary Emergency Contact: Ray,Ruth  [place] Home Phone: [phone] Mobile Phone: [phone] Relation: Daughter Secondary Emergency Contact: Francisca Sorrel  [place] Home Phone: [phone] Relation: Son  Code Status:  DNR Goals of care: Advanced Directive information    12/18/2023   11:01 AM  Advanced Directives  Does Patient Have a Medical Advance Directive? Yes  Type of Estate agent of [place];Living will;Out of facility DNR (pink MOST or yellow form)  Copy of Healthcare Power of Attorney in Chart? Yes - validated most recent copy scanned in chart (See row information)     Chief Complaint  Patient presents with   Acute Visit    HPI:  Pt is a 89 y.o. female seen today for an acute visit for Anxiety especially in the Afternoon  lives in SNF in [place]   Patient recently has been having behavior issues.. She was started on Seroquel  and Depakote  also on Zoloft She continues to have behavior issues including elopement and hitting the staff.  Labs showed Sodium of 146 CBC was normal  I discontinued her Seroquel   Started on Ativan  0.25 mg BID PRN This is helping her  She gets very anxious in Afternoon Most of the behaviors are around 3 pm I saw her at that time and she was tearful and Paranoid  Other history Patient has known h/o Meningioma.  MRI it showed meningioma again  which has increased in size causing compression on medulla Not Surgical candidate H/o Low Back Pain with Radiculopathy  H/o Breast Cancer Invasive Ductal Carcinoma diagnosed in 2016 On Prolia  for Osteoporosis H/o PAF  Follows with Cardiology On Eliquis  Also h/o Bradycardia Hypertension with Tendency for Orthostatic Hypotension  Past Medical History:  Diagnosis Date   Allergy    Ankle fracture, left    Atrial fibrillation (HCC)    Per [facility] New Patient Packet    Breast cancer (HCC) 1115/16   left    Bursitis of right shoulder    Cataract    Family history of cancer    Fracture of right wrist    GERD (gastroesophageal reflux disease)    Hyperlipidemia    Hypertension    Lumbar radiculitis    Per [facility] New Patient Packet    Meningioma [facility])    Per [facility] New Patient Packet    Skin cancer 2000   melanoma and basal cell   Stress fracture    Right Heel, Per [facility] New Patient Packet    TIA (transient ischemic attack) 2022   Vaso vagal episode    during preparation for colonoscopy   Past Surgical History:  Procedure Laterality Date   ABDOMINAL HYSTERECTOMY  1988   TAH/BSO--FIBROIDS   APPENDECTOMY     BREAST LUMPECTOMY     B/L--[facility]   BREAST LUMPECTOMY WITH RADIOACTIVE SEED AND SENTINEL LYMPH NODE BIOPSY Left 06/11/2015   Procedure: LEFT BREAST LUMPECTOMY WITH RADIOACTIVE SEED AND LEFT SENTINEL LYMPH NODE MAPPING;  Surgeon: Debby Shipper, MD;  Location: [facility] OR;  Service: General;  Laterality: Left;   CARDIOVERSION N/A 01/11/2022   Procedure: CARDIOVERSION;  Surgeon: Lonni Slain, MD;  Location: [facility] ENDOSCOPY;  Service: Cardiovascular;  Laterality: N/A;   CATARACT EXTRACTION  2015   COLONOSCOPY  2010   DG  BONE DENSITY ([facility] HX)     EYE SURGERY Bilateral    cataracts   fiberadenoma Bilateral 1978, 1980   GANGLION CYST EXCISION     L  hand   Lipiflow procedure      Allergies  Allergen Reactions   Sulfa Antibiotics Other (See Comments)    As a child, became rigid as a  stick and not responsive   Tape Other (See Comments)    Blisters, Please use paper tape only for short periods of time   Wound Dressing Adhesive Other (See Comments)    Blisters, Please use paper tape only for short periods of time, Blisters, Please use paper tape only for short periods of time   Glucosamine Forte [Nutritional Supplements] Rash   Latex Rash    Outpatient Encounter Medications as of 12/25/2023  Medication Sig   acetaminophen  (TYLENOL ) 500 MG tablet Take 1,000 mg by mouth 3 (three) times daily.   apixaban  (ELIQUIS ) 2.5 MG TABS tablet Take 1 tablet (2.5 mg total) by mouth 2 (two) times daily.   Calcium  Carb-Cholecalciferol  (CALCIUM  600+D3) 600-10 MG-MCG TABS Take 1 tablet by mouth in the morning.   Cholecalciferol  (VITAMIN D3) 1000 UNITS CAPS Take 1,000 Units by mouth in the morning.   cycloSPORINE  (RESTASIS ) 0.05 % ophthalmic emulsion Place 1 drop into both eyes 2 (two) times daily.  after applying a hot compress   diclofenac Sodium (VOLTAREN) 1 % GEL Apply 2 g topically 3 (three) times daily.   divalproex  (DEPAKOTE ) 125 MG DR tablet Take 1 tablet (125 mg total) by mouth 2 (two) times daily.   hydrALAZINE (APRESOLINE) 10 MG tablet Take 10 mg by mouth every 8 (eight) hours as needed.   lidocaine  (LIDODERM ) 5 % Place 1 patch onto the skin daily. Remove & Discard patch within 12 hours or as directed by MD   LORazepam  (ATIVAN ) 0.5 MG tablet Take 0.5 tablets (0.25 mg total) by mouth 2 (two) times daily as needed for up to 14 days (give for increased agitation or sundowning behaviors).   metoprolol  succinate (TOPROL  XL) 25 MG 24 hr tablet Take 25 mg every morning   mirtazapine (REMERON) 15 MG tablet Take 15 mg by mouth at bedtime.   polyethylene glycol (MIRALAX  / GLYCOLAX ) 17 g packet Take 17 g by mouth in the morning.   pravastatin  (PRAVACHOL ) 40 MG tablet TAKE ONE TABLET BY MOUTH ONCE DAILY   senna (SENOKOT) 8.6 MG TABS tablet Take 2 tablets (17.2 mg total) by mouth at  bedtime.   sertraline (ZOLOFT) 50 MG tablet Take 50 mg by mouth daily.   sodium chloride  (MURO 128) 5 % ophthalmic ointment Place 1 Application into both eyes at bedtime.   vitamin B-12 (CYANOCOBALAMIN ) 1000 MCG tablet Take 1 tablet (1,000 mcg total) by mouth daily.   denosumab  (PROLIA ) 60 MG/ML SOSY injection Inject 60 mg into the skin every 6 (six) months. (Patient not taking: Reported on 12/25/2023)   Facility-Administered Encounter Medications as of 12/25/2023  Medication   [START ON 03/19/2024] denosumab  (PROLIA ) injection 60 mg    Review of Systems  Constitutional:  Negative for activity change and appetite change.  HENT: Negative.    Respiratory:  Negative for cough and shortness of breath.   Cardiovascular:  Negative for  leg swelling.  Gastrointestinal:  Negative for constipation.  Genitourinary: Negative.   Musculoskeletal:  Positive for gait problem. Negative for arthralgias and myalgias.  Skin: Negative.   Neurological:  Negative for dizziness and weakness.  Psychiatric/Behavioral:  Positive for confusion and dysphoric mood. Negative for sleep disturbance. The patient is nervous/anxious.     Immunization History  Administered Date(s) Administered   Fluad  Quad(high Dose 65+) 02/27/2020, 03/29/2022   H1N1 07/01/2008   Influenza Whole 04/10/2007, 04/01/2009, 03/26/2010, 03/25/2012   Influenza, High Dose Seasonal PF 04/16/2014, 04/09/2015, 04/03/2017, 04/19/2018, 02/28/2019, 03/08/2021, 04/18/2023   Influenza-Unspecified 06/27/2016, 04/03/2017   Moderna Sars-Covid-2 Vaccination 04/27/2022   PFIZER Comirnaty(Gray Top)Covid-19 Tri-Sucrose Vaccine 01/05/2021   PFIZER(Purple Top)SARS-COV-2 Vaccination 07/18/2019, 08/08/2019, 03/29/2020   Pneumococcal Conjugate-13 05/13/2014   Pneumococcal Polysaccharide-23 08/29/2016   Td 11/05/2003   Tdap 06/28/2011, 09/19/2011, 10/23/2020   Tetanus 10/23/2020   Unspecified SARS-COV-2 Vaccination 04/18/2023   Zoster Recombinant(Shingrix)  10/08/2016, 01/03/2017   Zoster, Live 02/24/2010   Pertinent  Health Maintenance Due  Topic Date Due   MAMMOGRAM  07/20/2024 (Originally 07/12/2023)   INFLUENZA VACCINE  01/26/2024   DEXA SCAN  Completed      04/25/2023    1:52 PM 05/23/2023    3:02 PM 06/19/2023    4:14 PM 08/15/2023    5:28 PM 10/10/2023    3:19 PM  Fall Risk  Falls in the past year? 1 1 0 1 1  Was there an injury with Fall? 1 1 0 0 1  Fall Risk Category Calculator 3 3 0 1 3  Patient at Risk for Falls Due to History of fall(s);Impaired balance/gait;Impaired mobility History of fall(s);Impaired balance/gait;Impaired mobility No Fall Risks History of fall(s);Impaired balance/gait History of fall(s);Impaired balance/gait;Impaired mobility  Fall risk Follow up Falls evaluation completed;Education provided;Falls prevention discussed Falls evaluation completed;Education provided;Falls prevention discussed Falls evaluation completed Falls evaluation completed;Education provided Falls evaluation completed;Education provided   Functional Status Survey:    Vitals:   12/25/23 1332  BP: 122/67  Pulse: 68  Resp: 16  Temp: 97.6 F (36.4 C)  SpO2: 96%  Weight: 137 lb 4.8 oz (62.3 kg)  Height: 4' 8 (1.422 m)   Body mass index is 30.78 kg/m. Physical Exam Vitals reviewed.  Constitutional:      Appearance: Normal appearance.  HENT:     Head: Normocephalic.     Nose: Nose normal.     Mouth/Throat:     Mouth: Mucous membranes are moist.     Pharynx: Oropharynx is clear.   Eyes:     Pupils: Pupils are equal, round, and reactive to light.    Cardiovascular:     Rate and Rhythm: Normal rate and regular rhythm.     Pulses: Normal pulses.     Heart sounds: Normal heart sounds. No murmur heard. Pulmonary:     Effort: Pulmonary effort is normal.     Breath sounds: Normal breath sounds.  Abdominal:     General: Abdomen is flat. Bowel sounds are normal.     Palpations: Abdomen is soft.   Musculoskeletal:         General: No swelling.     Cervical back: Neck supple.   Skin:    General: Skin is warm.   Neurological:     General: No focal deficit present.     Mental Status: She is alert.   Psychiatric:        Mood and Affect: Mood normal.        Thought Content: Thought content  normal.     Labs reviewed: Recent Labs    02/23/23 0959 03/23/23 0000 08/24/23 0000 09/09/23 0650 09/09/23 0652  NA 140 144 142 141 145  K 3.7 4.7 4.1 3.7 4.3  CL 101 106 108 107 107  CO2 31 26* 24*  --  22  GLUCOSE 76  --   --  88 88  BUN 15 18 26* 24* 22  CREATININE 0.94 0.8 0.7 0.90 0.98  CALCIUM  9.8 9.8 9.3  --  9.9   Recent Labs    01/30/23 2123 02/23/23 0959 08/24/23 0000 09/09/23 0652 11/07/23 0000  AST 28 24 24 30  34  ALT 18 15 20 11 24   ALKPHOS 55 50 98 68 97  BILITOT 1.2 1.3*  --  0.6  --   PROT 5.7* 5.8*  --  6.1*  --   ALBUMIN 3.5 3.5 3.6 3.6 3.9   Recent Labs    01/30/23 2123 01/30/23 2133 02/23/23 0959 08/24/23 0000 09/09/23 0650 09/09/23 0652  WBC 8.8  --  8.6 6.6  --  8.0  NEUTROABS  --   --  6.5  --   --   --   HGB 14.9   < > 15.4* 13.7 16.7* 15.9*  HCT 43.8   < > 45.7 40 49.0* 47.2*  MCV 90.7  --  90.9  --   --  92.9  PLT 208  --  208 180  --  219   < > = values in this interval not displayed.   Lab Results  Component Value Date   TSH 2.16 08/24/2023   Lab Results  Component Value Date   HGBA1C 4.8 05/11/2021   Lab Results  Component Value Date   CHOL 122 08/24/2023   HDL 58 08/24/2023   LDLCALC 53 08/24/2023   LDLDIRECT 153.4 11/16/2009   TRIG 57 08/24/2023   CHOLHDL 2.5 05/11/2021    Significant Diagnostic Results in last 30 days:  No results found.  Assessment/Plan 1. Agitation due to dementia [facility]) On Depakote    2. Frequent falls   3. Meningioma (HCC)   4. Reactive depression (Primary) Start her on Buspar 5 mg in the afternoon Continue Ativan  low dose PRN    Family/ staff Communication:   Labs/tests ordered:

## 2024-01-01 ENCOUNTER — Non-Acute Institutional Stay (SKILLED_NURSING_FACILITY): Admitting: Internal Medicine

## 2024-01-01 ENCOUNTER — Encounter: Payer: Self-pay | Admitting: Internal Medicine

## 2024-01-01 ENCOUNTER — Telehealth: Payer: Self-pay | Admitting: *Deleted

## 2024-01-01 DIAGNOSIS — F329 Major depressive disorder, single episode, unspecified: Secondary | ICD-10-CM | POA: Diagnosis not present

## 2024-01-01 DIAGNOSIS — F03911 Unspecified dementia, unspecified severity, with agitation: Secondary | ICD-10-CM

## 2024-01-01 MED ORDER — DIVALPROEX SODIUM 125 MG PO DR TAB: 125.0000 mg | DELAYED_RELEASE_TABLET | Freq: Every day | ORAL | Status: DC

## 2024-01-01 MED ORDER — TRAMADOL HCL 50 MG PO TABS: 50.0000 mg | ORAL_TABLET | Freq: Three times a day (TID) | ORAL | 0 refills | Status: DC | PRN

## 2024-01-01 MED ORDER — BUSPIRONE HCL 5 MG PO TABS: 5.0000 mg | ORAL_TABLET | Freq: Two times a day (BID) | ORAL | Status: DC

## 2024-01-01 NOTE — Progress Notes (Signed)
 Location:  [facility] Room Number: 127- P Place of Service:  SNF [phone]) Provider:  Charlanne Fredia CROME, MD  Patient Care Team: Charlanne Fredia CROME, MD as PCP - General (Internal Medicine) Francyne Headland, MD as PCP - Cardiology (Cardiology) Shona Rush, MD as Consulting Physician (Dermatology) Camillo Golas, MD as Consulting Physician (Ophthalmology) Skeet Juliene SAUNDERS, DO as Consulting Physician (Neurology) Crawford Morna Pickle, NP as Nurse Practitioner (Hematology and Oncology)  Extended Emergency Contact Information Primary Emergency Contact: Ray,Ruth  [place] Home Phone: [phone] Mobile Phone: [phone] Relation: Daughter Secondary Emergency Contact: Fullilove,Rich  [place] Home Phone: [phone] Relation: Son  Code Status:  DNR Goals of care: Advanced Directive information    12/18/2023   11:01 AM  Advanced Directives  Does Patient Have a Medical Advance Directive? Yes  Type of Estate agent of [place];Living will;Out of facility DNR (pink MOST or yellow form)  Copy of Healthcare Power of Attorney in Chart? Yes - validated most recent copy scanned in chart (See row information)     Chief Complaint  Patient presents with   Acute Visit    Per Dr. Charlanne    HPI:  Pt is a 89 y.o. female seen today for an acute visit for Hospice referral and Anxiety with behaviors Lives in SNF in [place]   Patient has known h/o Meningioma.  MRI it showed meningioma again which has increased in size causing compression on medulla Not Surgical candidate H/o Low Back Pain with Radiculopathy  H/o Breast Cancer Invasive Ductal Carcinoma diagnosed in 2016 On Prolia  for Osteoporosis H/o PAF  Follows with Cardiology On Eliquis  Also h/o Bradycardia Hypertension with Tendency for Orthostatic Hypotension   Per Nurses Continue to have sundowning and agitation in the late Afternoon at night More sleeping at  night Also not eating well Progressively getting weaker  Now in Empire chair due to risk of falls  She was awake when I went to see her More responsive  Also seen Growth below the Tongue that nurses were worried about  Patient is now admitted in Hospice Past Medical History:  Diagnosis Date   Allergy    Ankle fracture, left    Atrial fibrillation (HCC)    Per [facility] New Patient Packet    Breast cancer (HCC) 1115/16   left    Bursitis of right shoulder    Cataract    Family history of cancer    Fracture of right wrist    GERD (gastroesophageal reflux disease)    Hyperlipidemia    Hypertension    Lumbar radiculitis    Per [facility] New Patient Packet    Meningioma [facility])    Per [facility] New Patient Packet    Skin cancer 2000   melanoma and basal cell   Stress fracture    Right Heel, Per [facility] New Patient Packet    TIA (transient ischemic attack) 2022   Vaso vagal episode    during preparation for colonoscopy   Past Surgical History:  Procedure Laterality Date   ABDOMINAL HYSTERECTOMY  1988   TAH/BSO--FIBROIDS   APPENDECTOMY     BREAST LUMPECTOMY     B/L--[facility]   BREAST LUMPECTOMY WITH RADIOACTIVE SEED AND SENTINEL LYMPH NODE BIOPSY Left 06/11/2015   Procedure: LEFT BREAST LUMPECTOMY WITH RADIOACTIVE SEED AND LEFT SENTINEL LYMPH NODE MAPPING;  Surgeon: Debby Shipper, MD;  Location: [facility] OR;  Service: General;  Laterality: Left;   CARDIOVERSION N/A 01/11/2022   Procedure: CARDIOVERSION;  Surgeon: Lonni Slain, MD;  Location: [facility] ENDOSCOPY;  Service: Cardiovascular;  Laterality: N/A;   CATARACT EXTRACTION  2015   COLONOSCOPY  2010   DG  BONE DENSITY ([facility] HX)     EYE SURGERY Bilateral    cataracts   fiberadenoma Bilateral 1978, 1980   GANGLION CYST EXCISION     L  hand   Lipiflow procedure      Allergies  Allergen Reactions   Sulfa Antibiotics Other (See Comments)    As a child, became rigid as a stick and not responsive   Tape Other (See Comments)    Blisters, Please  use paper tape only for short periods of time   Wound Dressing Adhesive Other (See Comments)    Blisters, Please use paper tape only for short periods of time, Blisters, Please use paper tape only for short periods of time   Glucosamine Forte [Nutritional Supplements] Rash   Latex Rash    Outpatient Encounter Medications as of 01/01/2024  Medication Sig   acetaminophen  (TYLENOL ) 500 MG tablet Take 1,000 mg by mouth 3 (three) times daily.   apixaban  (ELIQUIS ) 2.5 MG TABS tablet Take 1 tablet (2.5 mg total) by mouth 2 (two) times daily.   busPIRone  (BUSPAR ) 5 MG tablet Take 1 tablet (5 mg total) by mouth every evening.   Calcium  Carb-Cholecalciferol  (CALCIUM  600+D3) 600-10 MG-MCG TABS Take 1 tablet by mouth in the morning.   Cholecalciferol  (VITAMIN D3) 1000 UNITS CAPS Take 1,000 Units by mouth in the morning.   cycloSPORINE  (RESTASIS ) 0.05 % ophthalmic emulsion Place 1 drop into both eyes 2 (two) times daily.  after applying a hot compress   denosumab  (PROLIA ) 60 MG/ML SOSY injection Inject 60 mg into the skin every 6 (six) months.   diclofenac Sodium (VOLTAREN) 1 % GEL Apply 2 g topically 3 (three) times daily.   divalproex  (DEPAKOTE ) 125 MG DR tablet Take 1 tablet (125 mg total) by mouth 2 (two) times daily.   hydrALAZINE (APRESOLINE) 10 MG tablet Take 10 mg by mouth every 8 (eight) hours as needed.   lidocaine  (LIDODERM ) 5 % Place 1 patch onto the skin daily. Remove & Discard patch within 12 hours or as directed by MD (Patient taking differently: Place 1 patch onto the skin 2 (two) times daily. Remove & Discard patch within 12 hours or as directed by MD)   LORazepam  (ATIVAN ) 0.5 MG tablet Take 0.5 tablets (0.25 mg total) by mouth 2 (two) times daily as needed (give for increased agitation or sundowning behaviors).   metoprolol  succinate (TOPROL  XL) 25 MG 24 hr tablet Take 25 mg every morning   mirtazapine (REMERON) 15 MG tablet Take 15 mg by mouth at bedtime.   polyethylene glycol  (MIRALAX  / GLYCOLAX ) 17 g packet Take 17 g by mouth in the morning.   pravastatin  (PRAVACHOL ) 40 MG tablet TAKE ONE TABLET BY MOUTH ONCE DAILY   senna (SENOKOT) 8.6 MG TABS tablet Take 2 tablets (17.2 mg total) by mouth at bedtime.   sertraline (ZOLOFT) 50 MG tablet Take 50 mg by mouth daily.   sodium chloride  (MURO 128) 5 % ophthalmic ointment Place 1 Application into both eyes at bedtime.   vitamin B-12 (CYANOCOBALAMIN ) 1000 MCG tablet Take 1 tablet (1,000 mcg total) by mouth daily.   Facility-Administered Encounter Medications as of 01/01/2024  Medication   [START ON 03/19/2024] denosumab  (PROLIA ) injection 60 mg    Review of Systems  Unable to perform ROS: Dementia    Immunization History  Administered Date(s) Administered   Fluad  Quad(high Dose 65+) 02/27/2020, 03/29/2022   H1N1 07/01/2008   Influenza Whole 04/10/2007, 04/01/2009, 03/26/2010, 03/25/2012   Influenza, High Dose Seasonal PF 04/16/2014, 04/09/2015, 04/03/2017, 04/19/2018, 02/28/2019, 03/08/2021, 04/18/2023   Influenza-Unspecified 06/27/2016, 04/03/2017   Moderna Sars-Covid-2 Vaccination 04/27/2022   PFIZER Comirnaty(Gray Top)Covid-19 Tri-Sucrose Vaccine 01/05/2021   PFIZER(Purple Top)SARS-COV-2 Vaccination 07/18/2019, 08/08/2019, 03/29/2020   Pneumococcal Conjugate-13 05/13/2014   Pneumococcal Polysaccharide-23 08/29/2016   Td 11/05/2003   Tdap 06/28/2011, 07/27/2011, 09/19/2011, 10/23/2020   Tetanus 10/23/2020   Unspecified SARS-COV-2 Vaccination 04/18/2023   Zoster Recombinant(Shingrix) 10/08/2016, 01/03/2017   Zoster, Live 02/24/2010   Pertinent  Health Maintenance Due  Topic Date Due   MAMMOGRAM  07/20/2024 (Originally 07/12/2023)   INFLUENZA VACCINE  01/26/2024   DEXA SCAN  Completed      04/25/2023    1:52 PM 05/23/2023    3:02 PM 06/19/2023    4:14 PM 08/15/2023    5:28 PM 10/10/2023    3:19 PM  Fall Risk  Falls in the past year? 1 1 0 1 1  Was there an injury with Fall? 1 1 0 0 1  Fall Risk  Category Calculator 3 3 0 1 3  Patient at Risk for Falls Due to History of fall(s);Impaired balance/gait;Impaired mobility History of fall(s);Impaired balance/gait;Impaired mobility No Fall Risks History of fall(s);Impaired balance/gait History of fall(s);Impaired balance/gait;Impaired mobility  Fall risk Follow up Falls evaluation completed;Education provided;Falls prevention discussed Falls evaluation completed;Education provided;Falls prevention discussed Falls evaluation completed Falls evaluation completed;Education provided Falls evaluation completed;Education provided   Functional Status Survey:    Vitals:   01/01/24 1340  BP: 115/79  Pulse: 76  SpO2: 95%  Weight: 136 lb 3.2 oz (61.8 kg)  Height: 4' 8 (1.422 m)   Body mass index is 30.54 kg/m. Physical Exam Vitals reviewed.  Constitutional:      Appearance: Normal appearance.  HENT:     Head: Normocephalic.     Nose: Nose normal.     Mouth/Throat:     Mouth: Mucous membranes are moist.     Pharynx: Oropharynx is clear.     Comments: 2 hard lumps below the Tongue More likely Mandibular Tori Eyes:     Pupils: Pupils are equal, round, and reactive to light.  Cardiovascular:     Rate and Rhythm: Normal rate and regular rhythm.     Pulses: Normal pulses.     Heart sounds: Normal heart sounds. No murmur heard. Pulmonary:     Effort: Pulmonary effort is normal.     Breath sounds: Normal breath sounds.  Abdominal:     General: Abdomen is flat. Bowel sounds are normal.     Palpations: Abdomen is soft.  Musculoskeletal:        General: No swelling.     Cervical back: Neck supple.  Skin:    General: Skin is warm.  Neurological:     General: No focal deficit present.     Mental Status: She is alert.  Psychiatric:        Mood and Affect: Mood normal.        Thought Content: Thought content normal.     Labs reviewed: Recent Labs    02/23/23 0959 03/23/23 0000 08/24/23 0000 09/09/23 0650 09/09/23 0652  12/21/23 0000  NA 140   < > 142 141 145 146  K 3.7   < > 4.1 3.7 4.3 4.2  CL 101   < > 108 107 107 104  CO2 31   < >  24*  --  22 24*  GLUCOSE 76  --   --  88 88  --   BUN 15   < > 26* 24* 22 24*  CREATININE 0.94   < > 0.7 0.90 0.98 0.8  CALCIUM  9.8   < > 9.3  --  9.9 9.9   < > = values in this interval not displayed.   Recent Labs    01/30/23 2123 02/23/23 0959 08/24/23 0000 09/09/23 0652 11/07/23 0000 12/21/23 0000  AST 28 24   < > 30 34 39*  ALT 18 15   < > 11 24 15   ALKPHOS 55 50   < > 68 97 90  BILITOT 1.2 1.3*  --  0.6  --   --   PROT 5.7* 5.8*  --  6.1*  --   --   ALBUMIN 3.5 3.5   < > 3.6 3.9 4.1   < > = values in this interval not displayed.   Recent Labs    01/30/23 2123 01/30/23 2133 02/23/23 0959 08/24/23 0000 09/09/23 0650 09/09/23 0652 12/21/23 0000  WBC 8.8  --  8.6 6.6  --  8.0 8.2  NEUTROABS  --   --  6.5  --   --   --   --   HGB 14.9   < > 15.4* 13.7 16.7* 15.9* 15.6  HCT 43.8   < > 45.7 40 49.0* 47.2* 48*  MCV 90.7  --  90.9  --   --  92.9  --   PLT 208  --  208 180  --  219 200   < > = values in this interval not displayed.   Lab Results  Component Value Date   TSH 2.16 08/24/2023   Lab Results  Component Value Date   HGBA1C 4.8 05/11/2021   Lab Results  Component Value Date   CHOL 122 08/24/2023   HDL 58 08/24/2023   LDLCALC 53 08/24/2023   LDLDIRECT 153.4 11/16/2009   TRIG 57 08/24/2023   CHOLHDL 2.5 05/11/2021    Significant Diagnostic Results in last 30 days:  No results found.  Assessment/Plan 1. Agitation due to dementia (HCC) (Primary) Discontinue Depakote  in the morning due to feeling sleepy in the morning Will Use Buspar  5 mg BID Also Ativan  0.5 mg BID PRN Continue Depakote  in the evening  2. Reactive depression Buspar  5 mg BID for Anxiety Ativan  PRn  She is now admitted to hospice Discontinue Prolia  and Pravastatin    Family/ staff Communication:   Labs/tests ordered:

## 2024-01-01 NOTE — Telephone Encounter (Signed)
 Copied from CRM [id number]. Topic: Clinical - Medical Advice >> Jan 01, 2024  1:49 PM Diannia H wrote: Reason for CRM: Charmaine called from [facility] called and stated that the patient is going into hospice care and the patients daughter would like for the provider to continue her care in hospice. If you have any questions Charmaine can be reached at [phone]. Charmaine would like to know would the provider also manage the patient symptoms as well.

## 2024-01-04 ENCOUNTER — Non-Acute Institutional Stay (SKILLED_NURSING_FACILITY): Payer: Self-pay | Admitting: Adult Health

## 2024-01-04 ENCOUNTER — Encounter: Payer: Self-pay | Admitting: Adult Health

## 2024-01-04 DIAGNOSIS — F03911 Unspecified dementia, unspecified severity, with agitation: Secondary | ICD-10-CM | POA: Diagnosis not present

## 2024-01-04 DIAGNOSIS — D329 Benign neoplasm of meninges, unspecified: Secondary | ICD-10-CM | POA: Diagnosis not present

## 2024-01-04 MED ORDER — LORAZEPAM 0.5 MG PO TABS
0.2500 mg | ORAL_TABLET | Freq: Two times a day (BID) | ORAL | 1 refills | Status: DC
Start: 2024-01-04 — End: 2024-04-11

## 2024-01-04 MED ORDER — LORAZEPAM 0.5 MG PO TABS: 0.2500 mg | ORAL_TABLET | Freq: Two times a day (BID) | ORAL | 1 refills | Status: DC | PRN

## 2024-01-04 NOTE — Progress Notes (Signed)
 Location:  [facility] Room Number: 127 P Place of Service:  SNF (31) Hospice  Provider:  Tawni America, NP   Jessica Mullins Care Team: Charlanne Fredia CROME, MD as PCP - General (Internal Medicine) Francyne Headland, MD as PCP - Cardiology (Cardiology) Shona Rush, MD as Consulting Physician (Dermatology) Camillo Golas, MD as Consulting Physician (Ophthalmology) Skeet Juliene SAUNDERS, DO as Consulting Physician (Neurology) Crawford Morna Pickle, NP as Nurse Practitioner (Hematology and Oncology)  Extended Emergency Contact Information Primary Emergency Contact: Ray,Ruth  [place] Home Phone: [phone] Mobile Phone: [phone] Relation: Daughter Secondary Emergency Contact: Monical,Rich  [place] Home Phone: [phone] Relation: Son  Code Status:  DNR Goals of care: Advanced Directive information    12/18/2023   11:01 AM  Advanced Directives  Does Jessica Mullins Have a Medical Advance Directive? Yes  Type of Estate agent of [place];Living will;Out of facility DNR (pink MOST or yellow form)  Copy of Healthcare Power of Attorney in Chart? Yes - validated most recent copy scanned in chart (See row information)     Chief Complaint  Jessica Mullins presents with   Acute Visit    HPI:  Jessica Mullins is a 89 y.o. female seen today for an acute visit for agitation  The staff reports increased restlessness and attempts to get out of bed.  Responded well to ativan  this am which is ordered prn She is also not sleeping well on Remeron and depakote  at bedtime Daytime depakote  discontinued due lethargy Hospice consulted today due to declining cognition and hx of meningioma   Past Medical History:  Diagnosis Date   Allergy    Ankle fracture, left    Atrial fibrillation (HCC)    Per [facility] New Jessica Mullins Packet    Breast cancer (HCC) 1115/16   left    Bursitis of right shoulder    Cataract    Family history of cancer     Fracture of right wrist    GERD (gastroesophageal reflux disease)    Hyperlipidemia    Hypertension    Lumbar radiculitis    Per [facility] Jessica Mullins Packet    Meningioma [facility])    Per [facility] New Jessica Mullins Packet    Skin cancer 2000   melanoma and basal cell   Stress fracture    Right Heel, Per [facility] New Jessica Mullins Packet    TIA (transient ischemic attack) 2022   Vaso vagal episode    during preparation for colonoscopy   Past Surgical History:  Procedure Laterality Date   ABDOMINAL HYSTERECTOMY  1988   TAH/BSO--FIBROIDS   APPENDECTOMY     BREAST LUMPECTOMY     B/L--[facility]   BREAST LUMPECTOMY WITH RADIOACTIVE SEED AND SENTINEL LYMPH NODE BIOPSY Left 06/11/2015   Procedure: LEFT BREAST LUMPECTOMY WITH RADIOACTIVE SEED AND LEFT SENTINEL LYMPH NODE MAPPING;  Surgeon: Debby Shipper, MD;  Location: [facility] OR;  Service: General;  Laterality: Left;   CARDIOVERSION N/A 01/11/2022   Procedure: CARDIOVERSION;  Surgeon: Lonni Slain, MD;  Location: [facility] ENDOSCOPY;  Service: Cardiovascular;  Laterality: N/A;   CATARACT EXTRACTION  2015   COLONOSCOPY  2010   DG  BONE DENSITY ([facility] HX)     EYE SURGERY Bilateral    cataracts   fiberadenoma Bilateral 1978, 1980   GANGLION CYST EXCISION     L  hand   Lipiflow procedure      Allergies  Allergen Reactions   Sulfa Antibiotics Other (See Comments)    As a child, became rigid  as a stick and not responsive   Tape Other (See Comments)    Blisters, Please use paper tape only for short periods of time   Wound Dressing Adhesive Other (See Comments)    Blisters, Please use paper tape only for short periods of time, Blisters, Please use paper tape only for short periods of time   Glucosamine Forte [Nutritional Supplements] Rash   Latex Rash    Outpatient Encounter Medications as of 01/04/2024  Medication Sig   acetaminophen  (TYLENOL ) 500 MG tablet Take 1,000 mg by mouth 3 (three) times daily.   apixaban  (ELIQUIS ) 2.5 MG TABS tablet Take 1 tablet (2.5  mg total) by mouth 2 (two) times daily.   busPIRone  (BUSPAR ) 5 MG tablet Take 1 tablet (5 mg total) by mouth 2 (two) times daily.   Calcium  Carb-Cholecalciferol  (CALCIUM  600+D3) 600-10 MG-MCG TABS Take 1 tablet by mouth in the morning.   Cholecalciferol  (VITAMIN D3) 1000 UNITS CAPS Take 1,000 Units by mouth in the morning.   cycloSPORINE  (RESTASIS ) 0.05 % ophthalmic emulsion Place 1 drop into both eyes 2 (two) times daily.  after applying a hot compress   diclofenac Sodium (VOLTAREN) 1 % GEL Apply 2 g topically 3 (three) times daily.   divalproex  (DEPAKOTE ) 125 MG DR tablet Take 1 tablet (125 mg total) by mouth at bedtime.   hydrALAZINE (APRESOLINE) 10 MG tablet Take 10 mg by mouth every 8 (eight) hours as needed.   lidocaine  (LIDODERM ) 5 % Place 1 patch onto the skin daily. Remove & Discard patch within 12 hours or as directed by MD (Jessica Mullins taking differently: Place 1 patch onto the skin 2 (two) times daily. Remove & Discard patch within 12 hours or as directed by MD)   LORazepam  (ATIVAN ) 0.5 MG tablet Take 0.5 tablets (0.25 mg total) by mouth 2 (two) times daily as needed (give for increased agitation or sundowning behaviors).   metoprolol  succinate (TOPROL  XL) 25 MG 24 hr tablet Take 25 mg every morning   mirtazapine (REMERON) 15 MG tablet Take 15 mg by mouth at bedtime.   senna (SENOKOT) 8.6 MG TABS tablet Take 2 tablets (17.2 mg total) by mouth at bedtime.   sertraline (ZOLOFT) 50 MG tablet Take 50 mg by mouth daily.   sodium chloride  (MURO 128) 5 % ophthalmic ointment Place 1 Application into both eyes at bedtime.   traMADol  (ULTRAM ) 50 MG tablet Take 1 tablet (50 mg total) by mouth every 8 (eight) hours as needed. (Jessica Mullins taking differently: Take 50 mg by mouth every 6 (six) hours as needed.)   vitamin B-12 (CYANOCOBALAMIN ) 1000 MCG tablet Take 1 tablet (1,000 mcg total) by mouth daily.   polyethylene glycol (MIRALAX  / GLYCOLAX ) 17 g packet Take 17 g by mouth in the morning. (Jessica Mullins not  taking: Reported on 01/04/2024)   No facility-administered encounter medications on file as of 01/04/2024.    Review of Systems  Unable to perform ROS: Dementia    Immunization History  Administered Date(s) Administered   Fluad  Quad(high Dose 65+) 02/27/2020, 03/29/2022   H1N1 07/01/2008   Influenza Whole 04/10/2007, 04/01/2009, 03/26/2010, 03/25/2012   Influenza, High Dose Seasonal PF 04/16/2014, 04/09/2015, 04/03/2017, 04/19/2018, 02/28/2019, 03/08/2021, 04/18/2023   Influenza-Unspecified 06/27/2016, 04/03/2017   Moderna Sars-Covid-2 Vaccination 04/27/2022   PFIZER Comirnaty(Gray Top)Covid-19 Tri-Sucrose Vaccine 01/05/2021   PFIZER(Purple Top)SARS-COV-2 Vaccination 07/18/2019, 08/08/2019, 03/29/2020   Pneumococcal Conjugate-13 05/13/2014   Pneumococcal Polysaccharide-23 08/29/2016   Td 11/05/2003   Tdap 06/28/2011, 07/27/2011, 09/19/2011, 10/23/2020   Tetanus 10/23/2020  Unspecified SARS-COV-2 Vaccination 04/18/2023   Zoster Recombinant(Shingrix) 10/08/2016, 01/03/2017   Zoster, Live 02/24/2010   Pertinent  Health Maintenance Due  Topic Date Due   MAMMOGRAM  07/20/2024 (Originally 07/12/2023)   INFLUENZA VACCINE  01/26/2024   DEXA SCAN  Completed      04/25/2023    1:52 PM 05/23/2023    3:02 PM 06/19/2023    4:14 PM 08/15/2023    5:28 PM 10/10/2023    3:19 PM  Fall Risk  Falls in the past year? 1 1 0 1 1  Was there an injury with Fall? 1 1 0 0 1  Fall Risk Category Calculator 3 3 0 1 3  Jessica Mullins at Risk for Falls Due to History of fall(s);Impaired balance/gait;Impaired mobility History of fall(s);Impaired balance/gait;Impaired mobility No Fall Risks History of fall(s);Impaired balance/gait History of fall(s);Impaired balance/gait;Impaired mobility  Fall risk Follow up Falls evaluation completed;Education provided;Falls prevention discussed Falls evaluation completed;Education provided;Falls prevention discussed Falls evaluation completed Falls evaluation completed;Education  provided Falls evaluation completed;Education provided   Functional Status Survey:    Vitals:   01/04/24 0948  BP: 115/79  Pulse: 76  Resp: 17  Temp: (!) 97.2 F (36.2 C)  SpO2: 95%  Weight: 136 lb 3.2 oz (61.8 kg)  Height: 4' 8 (1.422 m)   Body mass index is 30.54 kg/m. Physical Exam Vitals and nursing note reviewed.  Constitutional:      Appearance: Normal appearance.  Neurological:     Mental Status: She is alert.     Comments: Alert, psychomotor restlessness attempting to get out of bed.      Labs reviewed: Recent Labs    02/23/23 0959 03/23/23 0000 08/24/23 0000 09/09/23 0650 09/09/23 0652 12/21/23 0000  NA 140   < > 142 141 145 146  K 3.7   < > 4.1 3.7 4.3 4.2  CL 101   < > 108 107 107 104  CO2 31   < > 24*  --  22 24*  GLUCOSE 76  --   --  88 88  --   BUN 15   < > 26* 24* 22 24*  CREATININE 0.94   < > 0.7 0.90 0.98 0.8  CALCIUM  9.8   < > 9.3  --  9.9 9.9   < > = values in this interval not displayed.   Recent Labs    01/30/23 2123 02/23/23 0959 08/24/23 0000 09/09/23 0652 11/07/23 0000 12/21/23 0000  AST 28 24   < > 30 34 39*  ALT 18 15   < > 11 24 15   ALKPHOS 55 50   < > 68 97 90  BILITOT 1.2 1.3*  --  0.6  --   --   PROT 5.7* 5.8*  --  6.1*  --   --   ALBUMIN 3.5 3.5   < > 3.6 3.9 4.1   < > = values in this interval not displayed.   Recent Labs    01/30/23 2123 01/30/23 2133 02/23/23 0959 08/24/23 0000 09/09/23 0650 09/09/23 0652 12/21/23 0000  WBC 8.8  --  8.6 6.6  --  8.0 8.2  NEUTROABS  --   --  6.5  --   --   --   --   HGB 14.9   < > 15.4* 13.7 16.7* 15.9* 15.6  HCT 43.8   < > 45.7 40 49.0* 47.2* 48*  MCV 90.7  --  90.9  --   --  92.9  --  PLT 208  --  208 180  --  219 200   < > = values in this interval not displayed.   Lab Results  Component Value Date   TSH 2.16 08/24/2023   Lab Results  Component Value Date   HGBA1C 4.8 05/11/2021   Lab Results  Component Value Date   CHOL 122 08/24/2023   HDL 58 08/24/2023    LDLCALC 53 08/24/2023   LDLDIRECT 153.4 11/16/2009   TRIG 57 08/24/2023   CHOLHDL 2.5 05/11/2021    Significant Diagnostic Results in last 30 days:  No results found.  Assessment/Plan  1. Meningioma (HCC) Noted to the brainstem with mass effect 09/09/23 CT 3.3cm Comfort care goals.   2. Agitation due to dementia [facility]) (Primary) Admitted to hospice today for advancing dementia Very restless  Continue depakote  and remeron Am depakote  made her too sleepy Will try ativan  which is effective scheduled and prn Staff asking for gel also because she sometimes refuses to swallow pills.

## 2024-01-26 NOTE — Progress Notes (Signed)
 error

## 2024-02-01 ENCOUNTER — Non-Acute Institutional Stay (SKILLED_NURSING_FACILITY): Payer: Self-pay | Admitting: Adult Health

## 2024-02-01 ENCOUNTER — Encounter: Payer: Self-pay | Admitting: Adult Health

## 2024-02-01 DIAGNOSIS — I480 Paroxysmal atrial fibrillation: Secondary | ICD-10-CM | POA: Diagnosis not present

## 2024-02-01 DIAGNOSIS — R634 Abnormal weight loss: Secondary | ICD-10-CM

## 2024-02-01 DIAGNOSIS — G8929 Other chronic pain: Secondary | ICD-10-CM

## 2024-02-01 DIAGNOSIS — F03911 Unspecified dementia, unspecified severity, with agitation: Secondary | ICD-10-CM

## 2024-02-01 DIAGNOSIS — I10 Essential (primary) hypertension: Secondary | ICD-10-CM

## 2024-02-01 DIAGNOSIS — D329 Benign neoplasm of meninges, unspecified: Secondary | ICD-10-CM | POA: Diagnosis not present

## 2024-02-01 DIAGNOSIS — M545 Low back pain, unspecified: Secondary | ICD-10-CM

## 2024-02-01 DIAGNOSIS — M810 Age-related osteoporosis without current pathological fracture: Secondary | ICD-10-CM

## 2024-02-01 NOTE — Progress Notes (Unsigned)
 Location:  [facility] Room Number: 127 P Place of Service:  SNF [phone]) Provider:  Tawni America, NP    Patient Care Team: Charlanne Fredia CROME, MD as PCP - General (Internal Medicine) Croitoru, Jerel, MD as PCP - Cardiology (Cardiology) Shona Rush, MD as Consulting Physician (Dermatology) Camillo Golas, MD as Consulting Physician (Ophthalmology) Skeet Juliene SAUNDERS, DO as Consulting Physician (Neurology) Crawford Morna Pickle, NP as Nurse Practitioner (Hematology and Oncology)  Extended Emergency Contact Information Primary Emergency Contact: Ray,Ruth  [place] Home Phone: [phone] Mobile Phone: [phone] Relation: Daughter Secondary Emergency Contact: Francisca Sorrel  [place] Home Phone: [phone] Relation: Son  Code Status:  DNR Goals of care: Advanced Directive information    12/18/2023   11:01 AM  Advanced Directives  Does Patient Have a Medical Advance Directive? Yes  Type of Estate agent of [place];Living will;Out of facility DNR (pink MOST or yellow form)  Copy of Healthcare Power of Attorney in Chart? Yes - validated most recent copy scanned in chart (See row information)     Chief Complaint  Patient presents with   Routine Visit    HPI:  Pt is a 89 y.o. female seen today for medical management of chronic diseases.     Past Medical History:  Diagnosis Date   Allergy    Ankle fracture, left    Atrial fibrillation (HCC)    Per [facility] New Patient Packet    Breast cancer (HCC) 1115/16   left    Bursitis of right shoulder    Cataract    Family history of cancer    Fracture of right wrist    GERD (gastroesophageal reflux disease)    Hyperlipidemia    Hypertension    Lumbar radiculitis    Per [facility] New Patient Packet    Meningioma [facility])    Per [facility] New Patient Packet    Skin cancer 2000   melanoma and basal cell   Stress fracture    Right Heel, Per [facility] New Patient  Packet    TIA (transient ischemic attack) 2022   Vaso vagal episode    during preparation for colonoscopy   Past Surgical History:  Procedure Laterality Date   ABDOMINAL HYSTERECTOMY  1988   TAH/BSO--FIBROIDS   APPENDECTOMY     BREAST LUMPECTOMY     B/L--[facility]   BREAST LUMPECTOMY WITH RADIOACTIVE SEED AND SENTINEL LYMPH NODE BIOPSY Left 06/11/2015   Procedure: LEFT BREAST LUMPECTOMY WITH RADIOACTIVE SEED AND LEFT SENTINEL LYMPH NODE MAPPING;  Surgeon: Debby Shipper, MD;  Location: [facility] OR;  Service: General;  Laterality: Left;   CARDIOVERSION N/A 01/11/2022   Procedure: CARDIOVERSION;  Surgeon: Lonni Slain, MD;  Location: [facility] ENDOSCOPY;  Service: Cardiovascular;  Laterality: N/A;   CATARACT EXTRACTION  2015   COLONOSCOPY  2010   DG  BONE DENSITY ([facility] HX)     EYE SURGERY Bilateral    cataracts   fiberadenoma Bilateral 1978, 1980   GANGLION CYST EXCISION     L  hand   Lipiflow procedure      Allergies  Allergen Reactions   Sulfa Antibiotics Other (See Comments)    As a child, became rigid as a stick and not responsive   Tape Other (See Comments)    Blisters, Please use paper tape only for short periods of time   Wound Dressing Adhesive Other (See Comments)    Blisters, Please use paper tape only for short periods of time, Blisters, Please use  paper tape only for short periods of time   Glucosamine Forte [Nutritional Supplements] Rash   Latex Rash    Outpatient Encounter Medications as of 02/01/2024  Medication Sig   acetaminophen  (TYLENOL ) 500 MG tablet Take 1,000 mg by mouth 3 (three) times daily. (Patient taking differently: Take 650 mg by mouth in the morning and at bedtime.)   aspirin  81 MG chewable tablet Chew 81 mg by mouth daily.   busPIRone  (BUSPAR ) 5 MG tablet Take 1 tablet (5 mg total) by mouth 2 (two) times daily.   cycloSPORINE  (RESTASIS ) 0.05 % ophthalmic emulsion Place 1 drop into both eyes 2 (two) times daily.  after applying a hot compress    diclofenac Sodium (VOLTAREN) 1 % GEL Apply 2 g topically 3 (three) times daily.   divalproex  (DEPAKOTE ) 125 MG DR tablet Take 1 tablet (125 mg total) by mouth at bedtime. (Patient taking differently: Take 250 mg by mouth at bedtime.)   hydrALAZINE (APRESOLINE) 10 MG tablet Take 10 mg by mouth every 8 (eight) hours as needed.   lidocaine  (LIDODERM ) 5 % Place 1 patch onto the skin daily. Remove & Discard patch within 12 hours or as directed by MD (Patient taking differently: Place 1 patch onto the skin 2 (two) times daily. Remove & Discard patch within 12 hours or as directed by MD)   LORazepam  (ATIVAN ) 0.5 MG tablet Take 0.5 tablets (0.25 mg total) by mouth 2 (two) times daily.   LORazepam  (ATIVAN ) 0.5 MG tablet Take 0.5 tablets (0.25 mg total) by mouth 2 (two) times daily as needed for anxiety. PLEASE COMPOUND ATIVAN  GEL and give 0.25 mg BID PRN for agitation if pt can not swallow oral formulation   meloxicam  (MOBIC ) 7.5 MG tablet Take 7.5 mg by mouth daily.   metoprolol  succinate (TOPROL  XL) 25 MG 24 hr tablet Take 25 mg every morning   mirtazapine (REMERON) 15 MG tablet Take 15 mg by mouth at bedtime.   senna (SENOKOT) 8.6 MG TABS tablet Take 2 tablets (17.2 mg total) by mouth at bedtime.   sertraline (ZOLOFT) 50 MG tablet Take 50 mg by mouth daily.   sodium chloride  (MURO 128) 5 % ophthalmic ointment Place 1 Application into both eyes at bedtime.   traMADol  (ULTRAM ) 50 MG tablet Take 1 tablet (50 mg total) by mouth every 8 (eight) hours as needed.   apixaban  (ELIQUIS ) 2.5 MG TABS tablet Take 1 tablet (2.5 mg total) by mouth 2 (two) times daily. (Patient not taking: Reported on 02/01/2024)   Calcium  Carb-Cholecalciferol  (CALCIUM  600+D3) 600-10 MG-MCG TABS Take 1 tablet by mouth in the morning. (Patient not taking: Reported on 02/01/2024)   Cholecalciferol  (VITAMIN D3) 1000 UNITS CAPS Take 1,000 Units by mouth in the morning. (Patient not taking: Reported on 02/01/2024)   polyethylene glycol (MIRALAX  /  GLYCOLAX ) 17 g packet Take 17 g by mouth in the morning. (Patient not taking: Reported on 02/01/2024)   vitamin B-12 (CYANOCOBALAMIN ) 1000 MCG tablet Take 1 tablet (1,000 mcg total) by mouth daily. (Patient not taking: Reported on 02/01/2024)   No facility-administered encounter medications on file as of 02/01/2024.    Review of Systems  Immunization History  Administered Date(s) Administered   Fluad  Quad(high Dose 65+) 02/27/2020, 03/29/2022   H1N1 07/01/2008   Influenza Whole 04/10/2007, 04/01/2009, 03/26/2010, 03/25/2012   Influenza, High Dose Seasonal PF 04/16/2014, 04/09/2015, 04/03/2017, 04/19/2018, 02/28/2019, 03/08/2021, 04/18/2023   Influenza-Unspecified 06/27/2016, 04/03/2017   Moderna Sars-Covid-2 Vaccination 04/27/2022   PFIZER Comirnaty(Gray Top)Covid-19 Tri-Sucrose Vaccine 01/05/2021  PFIZER(Purple Top)SARS-COV-2 Vaccination 07/18/2019, 08/08/2019, 03/29/2020   Pneumococcal Conjugate-13 05/13/2014   Pneumococcal Polysaccharide-23 08/29/2016   Td 11/05/2003   Tdap 06/28/2011, 07/27/2011, 09/19/2011, 10/23/2020   Tetanus 10/23/2020   Unspecified SARS-COV-2 Vaccination 04/18/2023   Zoster Recombinant(Shingrix) 10/08/2016, 01/03/2017   Zoster, Live 02/24/2010   Pertinent  Health Maintenance Due  Topic Date Due   INFLUENZA VACCINE  01/26/2024   MAMMOGRAM  07/20/2024 (Originally 07/12/2023)   DEXA SCAN  Completed      04/25/2023    1:52 PM 05/23/2023    3:02 PM 06/19/2023    4:14 PM 08/15/2023    5:28 PM 10/10/2023    3:19 PM  Fall Risk  Falls in the past year? 1 1 0 1 1  Was there an injury with Fall? 1 1 0 0 1  Fall Risk Category Calculator 3 3 0 1 3  Patient at Risk for Falls Due to History of fall(s);Impaired balance/gait;Impaired mobility History of fall(s);Impaired balance/gait;Impaired mobility No Fall Risks History of fall(s);Impaired balance/gait History of fall(s);Impaired balance/gait;Impaired mobility  Fall risk Follow up Falls evaluation completed;Education  provided;Falls prevention discussed Falls evaluation completed;Education provided;Falls prevention discussed Falls evaluation completed Falls evaluation completed;Education provided Falls evaluation completed;Education provided   Functional Status Survey:    Vitals:   02/01/24 1036  BP: (!) 143/84  Pulse: 76  Resp: 18  Temp: (!) 96.8 F (36 C)  SpO2: 97%  Weight: 131 lb 6.4 oz (59.6 kg)  Height: 4' 8 (1.422 m)   Body mass index is 29.46 kg/m. Physical Exam  Labs reviewed: Recent Labs    02/23/23 0959 03/23/23 0000 08/24/23 0000 09/09/23 0650 09/09/23 0652 12/21/23 0000  NA 140   < > 142 141 145 146  K 3.7   < > 4.1 3.7 4.3 4.2  CL 101   < > 108 107 107 104  CO2 31   < > 24*  --  22 24*  GLUCOSE 76  --   --  88 88  --   BUN 15   < > 26* 24* 22 24*  CREATININE 0.94   < > 0.7 0.90 0.98 0.8  CALCIUM  9.8   < > 9.3  --  9.9 9.9   < > = values in this interval not displayed.   Recent Labs    02/23/23 0959 08/24/23 0000 09/09/23 0652 11/07/23 0000 12/21/23 0000  AST 24   < > 30 34 39*  ALT 15   < > 11 24 15   ALKPHOS 50   < > 68 97 90  BILITOT 1.3*  --  0.6  --   --   PROT 5.8*  --  6.1*  --   --   ALBUMIN 3.5   < > 3.6 3.9 4.1   < > = values in this interval not displayed.   Recent Labs    02/23/23 0959 08/24/23 0000 09/09/23 0650 09/09/23 0652 12/21/23 0000  WBC 8.6 6.6  --  8.0 8.2  NEUTROABS 6.5  --   --   --   --   HGB 15.4* 13.7 16.7* 15.9* 15.6  HCT 45.7 40 49.0* 47.2* 48*  MCV 90.9  --   --  92.9  --   PLT 208 180  --  219 200   Lab Results  Component Value Date   TSH 2.16 08/24/2023   Lab Results  Component Value Date   HGBA1C 4.8 05/11/2021   Lab Results  Component Value Date   CHOL 122  08/24/2023   HDL 58 08/24/2023   LDLCALC 53 08/24/2023   LDLDIRECT 153.4 11/16/2009   TRIG 57 08/24/2023   CHOLHDL 2.5 05/11/2021    Significant Diagnostic Results in last 30 days:  No results found.  Assessment/Plan There are no diagnoses  linked to this encounter.   Family/ staff Communication: ***  Labs/tests ordered:  ***

## 2024-02-02 ENCOUNTER — Encounter: Payer: Self-pay | Admitting: Adult Health

## 2024-02-02 DIAGNOSIS — M810 Age-related osteoporosis without current pathological fracture: Secondary | ICD-10-CM | POA: Insufficient documentation

## 2024-02-02 MED ORDER — DIVALPROEX SODIUM 125 MG PO DR TAB: 250.0000 mg | DELAYED_RELEASE_TABLET | Freq: Every day | ORAL | Status: DC

## 2024-02-15 ENCOUNTER — Telehealth: Payer: Self-pay | Admitting: *Deleted

## 2024-02-15 NOTE — Telephone Encounter (Signed)
 Patient is scheduled for a Prolia  Injection 03/19/2024 and needs labs ordered and scheduled at [facility].

## 2024-02-15 NOTE — Telephone Encounter (Signed)
 Prolia  Copay $80 and PA Approved 06-28-2023-06/26/2024 Auth#: [id number]

## 2024-02-15 NOTE — Telephone Encounter (Signed)
 We will discontinue her Prolia  as she is non Ambulatory and under hospice care

## 2024-03-19 ENCOUNTER — Ambulatory Visit

## 2024-03-19 NOTE — Telephone Encounter (Signed)
 Please see message from Dr.Gupta. Message routed to admin staff to cancel appointment for today 03/19/2024.

## 2024-03-27 DEATH — deceased
